# Patient Record
Sex: Male | Born: 1955 | Race: Black or African American | Hispanic: No | Marital: Married | Smoking: Former smoker
Health system: Southern US, Community
[De-identification: ages and names within clinical notes are randomized; demographics above are authoritative.]

## PROBLEM LIST (undated history)

## (undated) DIAGNOSIS — E785 Hyperlipidemia, unspecified: Secondary | ICD-10-CM

## (undated) DIAGNOSIS — I619 Nontraumatic intracerebral hemorrhage, unspecified: Secondary | ICD-10-CM

## (undated) DIAGNOSIS — I10 Essential (primary) hypertension: Secondary | ICD-10-CM

## (undated) DIAGNOSIS — K219 Gastro-esophageal reflux disease without esophagitis: Secondary | ICD-10-CM

## (undated) DIAGNOSIS — E7800 Pure hypercholesterolemia, unspecified: Secondary | ICD-10-CM

## (undated) DIAGNOSIS — E119 Type 2 diabetes mellitus without complications: Secondary | ICD-10-CM

## (undated) HISTORY — DX: Hyperlipidemia, unspecified: E78.5

## (undated) HISTORY — PX: OTHER SURGICAL HISTORY: SHX169

---

## 2008-09-24 ENCOUNTER — Emergency Department: Payer: Self-pay | Admitting: Emergency Medicine

## 2008-09-26 ENCOUNTER — Emergency Department: Payer: Self-pay | Admitting: Emergency Medicine

## 2008-09-27 ENCOUNTER — Observation Stay: Payer: Self-pay | Admitting: Specialist

## 2012-10-11 ENCOUNTER — Ambulatory Visit: Payer: Self-pay | Admitting: Internal Medicine

## 2014-04-03 ENCOUNTER — Ambulatory Visit: Payer: Self-pay | Admitting: Internal Medicine

## 2014-04-05 ENCOUNTER — Ambulatory Visit: Payer: Self-pay | Admitting: Internal Medicine

## 2015-07-19 ENCOUNTER — Observation Stay
Admission: EM | Admit: 2015-07-19 | Discharge: 2015-07-21 | Disposition: A | Discharge status: Home / Self Care | Payer: BC Managed Care – PPO | Attending: Internal Medicine | Admitting: Internal Medicine

## 2015-07-19 ENCOUNTER — Encounter: Payer: Self-pay | Admitting: Emergency Medicine

## 2015-07-19 DIAGNOSIS — I10 Essential (primary) hypertension: Secondary | ICD-10-CM | POA: Diagnosis not present

## 2015-07-19 DIAGNOSIS — E119 Type 2 diabetes mellitus without complications: Secondary | ICD-10-CM | POA: Insufficient documentation

## 2015-07-19 DIAGNOSIS — R197 Diarrhea, unspecified: Secondary | ICD-10-CM | POA: Insufficient documentation

## 2015-07-19 DIAGNOSIS — T464X5A Adverse effect of angiotensin-converting-enzyme inhibitors, initial encounter: Secondary | ICD-10-CM | POA: Insufficient documentation

## 2015-07-19 DIAGNOSIS — T783XXA Angioneurotic edema, initial encounter: Principal | ICD-10-CM | POA: Insufficient documentation

## 2015-07-19 DIAGNOSIS — Z7984 Long term (current) use of oral hypoglycemic drugs: Secondary | ICD-10-CM | POA: Diagnosis not present

## 2015-07-19 DIAGNOSIS — Z79899 Other long term (current) drug therapy: Secondary | ICD-10-CM | POA: Diagnosis not present

## 2015-07-19 DIAGNOSIS — E7800 Pure hypercholesterolemia, unspecified: Secondary | ICD-10-CM | POA: Insufficient documentation

## 2015-07-19 DIAGNOSIS — K5900 Constipation, unspecified: Secondary | ICD-10-CM | POA: Insufficient documentation

## 2015-07-19 DIAGNOSIS — Z882 Allergy status to sulfonamides status: Secondary | ICD-10-CM | POA: Diagnosis not present

## 2015-07-19 HISTORY — DX: Pure hypercholesterolemia, unspecified: E78.00

## 2015-07-19 HISTORY — DX: Essential (primary) hypertension: I10

## 2015-07-19 HISTORY — DX: Type 2 diabetes mellitus without complications: E11.9

## 2015-07-19 LAB — CBC
HCT: 41.7 % (ref 40.0–52.0)
HEMOGLOBIN: 13.9 g/dL (ref 13.0–18.0)
MCH: 28 pg (ref 26.0–34.0)
MCHC: 33.3 g/dL (ref 32.0–36.0)
MCV: 84.1 fL (ref 80.0–100.0)
PLATELETS: 301 10*3/uL (ref 150–440)
RBC: 4.96 MIL/uL (ref 4.40–5.90)
RDW: 13.9 % (ref 11.5–14.5)
WBC: 13 10*3/uL — AB (ref 3.8–10.6)

## 2015-07-19 LAB — BASIC METABOLIC PANEL
ANION GAP: 11 (ref 5–15)
BUN: 13 mg/dL (ref 6–20)
CHLORIDE: 102 mmol/L (ref 101–111)
CO2: 23 mmol/L (ref 22–32)
Calcium: 9 mg/dL (ref 8.9–10.3)
Creatinine, Ser: 0.87 mg/dL (ref 0.61–1.24)
Glucose, Bld: 125 mg/dL — ABNORMAL HIGH (ref 65–99)
POTASSIUM: 3.7 mmol/L (ref 3.5–5.1)
SODIUM: 136 mmol/L (ref 135–145)

## 2015-07-19 LAB — GLUCOSE, CAPILLARY: Glucose-Capillary: 169 mg/dL — ABNORMAL HIGH (ref 65–99)

## 2015-07-19 MED ORDER — ONDANSETRON HCL 4 MG/2ML IJ SOLN
4.0000 mg | Freq: Four times a day (QID) | INTRAMUSCULAR | Status: DC | PRN
Start: 2015-07-19 — End: 2015-07-21

## 2015-07-19 MED ORDER — LINAGLIPTIN 5 MG PO TABS
5.0000 mg | ORAL_TABLET | Freq: Every day | ORAL | Status: DC
  Administered 2015-07-20 – 2015-07-21 (×2): 5 mg via ORAL
  Filled 2015-07-19 (×2): qty 1

## 2015-07-19 MED ORDER — SODIUM CHLORIDE 0.45 % IV SOLN
INTRAVENOUS | Status: DC
  Administered 2015-07-19: 23:00:00 via INTRAVENOUS

## 2015-07-19 MED ORDER — AMLODIPINE BESYLATE 10 MG PO TABS
10.0000 mg | ORAL_TABLET | Freq: Every day | ORAL | Status: DC
  Administered 2015-07-19 – 2015-07-21 (×3): 10 mg via ORAL
  Filled 2015-07-19 (×3): qty 1

## 2015-07-19 MED ORDER — DEXAMETHASONE SODIUM PHOSPHATE 10 MG/ML IJ SOLN
10.0000 mg | Freq: Once | INTRAMUSCULAR | Status: AC
  Administered 2015-07-19: 10 mg via INTRAVENOUS
  Filled 2015-07-19: qty 1

## 2015-07-19 MED ORDER — INSULIN ASPART 100 UNIT/ML ~~LOC~~ SOLN: 0.0000 [IU] | Freq: Every day | SUBCUTANEOUS | Status: DC

## 2015-07-19 MED ORDER — ATORVASTATIN CALCIUM 20 MG PO TABS
40.0000 mg | ORAL_TABLET | Freq: Every evening | ORAL | Status: DC
  Administered 2015-07-20: 40 mg via ORAL
  Filled 2015-07-19: qty 2

## 2015-07-19 MED ORDER — METFORMIN HCL 500 MG PO TABS
1000.0000 mg | ORAL_TABLET | Freq: Two times a day (BID) | ORAL | Status: DC
  Administered 2015-07-20 – 2015-07-21 (×3): 1000 mg via ORAL
  Filled 2015-07-19 (×3): qty 2

## 2015-07-19 MED ORDER — METOPROLOL TARTRATE 5 MG/5ML IV SOLN
5.0000 mg | Freq: Four times a day (QID) | INTRAVENOUS | Status: DC | PRN
  Administered 2015-07-19 – 2015-07-20 (×2): 5 mg via INTRAVENOUS
  Filled 2015-07-19 (×3): qty 5

## 2015-07-19 MED ORDER — INSULIN ASPART 100 UNIT/ML ~~LOC~~ SOLN
0.0000 [IU] | Freq: Three times a day (TID) | SUBCUTANEOUS | Status: DC
  Administered 2015-07-20 (×3): 7 [IU] via SUBCUTANEOUS
  Administered 2015-07-21: 4 [IU] via SUBCUTANEOUS
  Filled 2015-07-19 (×3): qty 7

## 2015-07-19 MED ORDER — FAMOTIDINE IN NACL 20-0.9 MG/50ML-% IV SOLN
20.0000 mg | Freq: Two times a day (BID) | INTRAVENOUS | Status: DC
  Administered 2015-07-20: 20 mg via INTRAVENOUS
  Filled 2015-07-19 (×2): qty 50

## 2015-07-19 MED ORDER — FAMOTIDINE IN NACL 20-0.9 MG/50ML-% IV SOLN
20.0000 mg | Freq: Once | INTRAVENOUS | Status: AC
  Administered 2015-07-19: 20 mg via INTRAVENOUS
  Filled 2015-07-19: qty 50

## 2015-07-19 MED ORDER — INSULIN ASPART 100 UNIT/ML ~~LOC~~ SOLN
6.0000 [IU] | Freq: Three times a day (TID) | SUBCUTANEOUS | Status: DC
  Administered 2015-07-20 – 2015-07-21 (×4): 6 [IU] via SUBCUTANEOUS
  Filled 2015-07-19 (×4): qty 6

## 2015-07-19 MED ORDER — ACETAMINOPHEN 325 MG PO TABS: 650.0000 mg | ORAL_TABLET | Freq: Four times a day (QID) | ORAL | Status: DC | PRN

## 2015-07-19 MED ORDER — DIPHENHYDRAMINE HCL 50 MG/ML IJ SOLN
25.0000 mg | Freq: Four times a day (QID) | INTRAMUSCULAR | Status: DC | PRN
  Administered 2015-07-20: 25 mg via INTRAVENOUS
  Filled 2015-07-19: qty 1

## 2015-07-19 MED ORDER — METHYLPREDNISOLONE SODIUM SUCC 125 MG IJ SOLR
60.0000 mg | Freq: Four times a day (QID) | INTRAMUSCULAR | Status: DC
  Administered 2015-07-19 – 2015-07-20 (×2): 60 mg via INTRAVENOUS
  Filled 2015-07-19 (×2): qty 2

## 2015-07-19 MED ORDER — DIPHENHYDRAMINE HCL 50 MG/ML IJ SOLN
25.0000 mg | Freq: Once | INTRAMUSCULAR | Status: AC
  Administered 2015-07-19: 25 mg via INTRAVENOUS
  Filled 2015-07-19: qty 1

## 2015-07-19 MED ORDER — ENOXAPARIN SODIUM 40 MG/0.4ML ~~LOC~~ SOLN
40.0000 mg | SUBCUTANEOUS | Status: DC
  Administered 2015-07-19 – 2015-07-20 (×2): 40 mg via SUBCUTANEOUS
  Filled 2015-07-19 (×2): qty 0.4

## 2015-07-19 MED ORDER — ONDANSETRON HCL 4 MG PO TABS: 4.0000 mg | ORAL_TABLET | Freq: Four times a day (QID) | ORAL | Status: DC | PRN

## 2015-07-19 MED ORDER — ACETAMINOPHEN 650 MG RE SUPP: 650.0000 mg | Freq: Four times a day (QID) | RECTAL | Status: DC | PRN

## 2015-07-19 MED ORDER — METOPROLOL TARTRATE 25 MG PO TABS
25.0000 mg | ORAL_TABLET | Freq: Every day | ORAL | Status: DC
  Administered 2015-07-20: 25 mg via ORAL
  Filled 2015-07-19: qty 1

## 2015-07-19 NOTE — Progress Notes (Signed)
Pt admitted with elevated blood pressure. Notified Dr Claria Dice. Received order for IV lopressor

## 2015-07-19 NOTE — H&P (Signed)
[facility] [place] NAME: Joshua Donovan    MR#:  [id number]  DATE OF BIRTH:  1955-03-06  DATE OF ADMISSION:  07/19/2015  PRIMARY CARE PHYSICIAN: Albina Billet, MD   REQUESTING/REFERRING PHYSICIAN:   CHIEF COMPLAINT:   Chief Complaint  Patient presents with  . Angioedema    HISTORY OF PRESENT ILLNESS: Joshua Donovan  is a 61 y.o. male with a known history of diabetes mellitus, hypertension, hyperlipidemia, who presents to the hospital with complaints of swelling below her lip and chin. Constipation. He noted lower lip swelling and chin swelling is around noon, he denied any dysphagia symptoms with water after the swelling happened, however, patient bit the inside mucosa of the  lower lip the due to swelling.   PAST MEDICAL HISTORY:   Past Medical History  Diagnosis Date  . Diabetes mellitus without complication ([place])   . Hypertension   . High cholesterol     PAST SURGICAL HISTORY: History reviewed. No pertinent past surgical history.  SOCIAL HISTORY:  Social History  Substance Use Topics  . Smoking status: Unknown If Ever Smoked  . Smokeless tobacco: Not on file  . Alcohol Use: Not on file    FAMILY HISTORY: No early coronary artery disease  DRUG ALLERGIES:  Allergies  Allergen Reactions  . Sulfa Antibiotics Swelling    Review of Systems  Constitutional: Positive for weight loss. Negative for fever, chills and malaise/fatigue.  HENT: Negative for congestion.   Eyes: Negative for blurred vision and double vision.  Respiratory: Positive for cough. Negative for sputum production, shortness of breath and wheezing.   Cardiovascular: Negative for chest pain, palpitations, orthopnea, leg swelling and PND.  Gastrointestinal: Positive for diarrhea. Negative for nausea, vomiting, abdominal pain, constipation, blood in stool and melena.  Genitourinary: Negative for dysuria, urgency, frequency and hematuria.  Musculoskeletal:  Positive for myalgias. Negative for falls.  Skin: Negative for rash.  Neurological: Positive for weakness. Negative for dizziness.  Psychiatric/Behavioral: Negative for depression and memory loss. The patient is not nervous/anxious.     MEDICATIONS AT HOME:  Prior to Admission medications   Medication Sig Start Date End Date Taking? Authorizing Provider  amLODipine-benazepril (LOTREL) 5-20 MG capsule Take 1 capsule by mouth daily. 07/15/15  Yes Historical Provider, MD  atorvastatin (LIPITOR) 40 MG tablet Take 40 mg by mouth every evening. 06/22/15  Yes Historical Provider, MD  cetirizine (ZYRTEC) 10 MG tablet Take 10 mg by mouth daily.   Yes Historical Provider, MD  JANUVIA 100 MG tablet Take 100 mg by mouth daily. 06/15/15  Yes Historical Provider, MD  metFORMIN (GLUCOPHAGE) 500 MG tablet Take 1,000 mg by mouth 2 (two) times daily. 07/15/15  Yes Historical Provider, MD  metoprolol tartrate (LOPRESSOR) 25 MG tablet Take 25 mg by mouth daily. 06/22/15  Yes Historical Provider, MD  Misc Natural Products (MENS POTENT FORMULA) TABS Take 1 tablet by mouth daily.   Yes Historical Provider, MD      PHYSICAL EXAMINATION:   VITAL SIGNS: Blood pressure 181/107, pulse 85, temperature 98.9 F (37.2 C), temperature source Oral, resp. rate 16, height 5\' 7"  (1.702 m), weight 98.431 kg (217 lb), SpO2 98 %.  GENERAL:  60 y.o.-year-old patient lying in the bed with no acute distress.Significantly swollen lower lip and chin visible from the distance  EYES: Pupils equal, round, reactive to light and accommodation. No scleral icterus. Extraocular muscles intact.  HEENT: Head atraumatic, normocephalic. Oropharynx and nasopharynx clear. Swelling of the  lower lip and chin, but not tongue. NECK:  Supple, no jugular venous distention. No thyroid enlargement, no tenderness.  LUNGS: Normal breath sounds bilaterally, no wheezing, rales,rhonchi or crepitation. No use of accessory muscles of respiration.  CARDIOVASCULAR: S1,  S2 normal. No murmurs, rubs, or gallops.  ABDOMEN: Soft, nontender, nondistended. Bowel sounds present. No organomegaly or mass.  EXTREMITIES: No pedal edema, cyanosis, or clubbing.  NEUROLOGIC: Cranial nerves II through XII are intact. Muscle strength 5/5 in all extremities. Sensation intact. Gait not checked.  PSYCHIATRIC: The patient is alert and oriented x 3.  SKIN: No obvious rash, lesion, or ulcer.   LABORATORY PANEL:   CBC  Recent Labs Lab 07/19/15 1929  WBC 13.0*  HGB 13.9  HCT 41.7  PLT 301  MCV 84.1  MCH 28.0  MCHC 33.3  RDW 13.9   ------------------------------------------------------------------------------------------------------------------  Chemistries  No results for input(s): NA, K, CL, CO2, GLUCOSE, BUN, CREATININE, CALCIUM, MG, AST, ALT, ALKPHOS, BILITOT in the last 168 hours.  Invalid input(s): GFRCGP ------------------------------------------------------------------------------------------------------------------  Cardiac Enzymes No results for input(s): TROPONINI in the last 168 hours. ------------------------------------------------------------------------------------------------------------------  RADIOLOGY: No results found.  EKG: No orders found for this or any previous visit.  IMPRESSION AND PLAN:  Principal Problem:   Angioedema Active Problems:   Malignant essential hypertension   Diarrhea #1. Angioedema due to ACE inhibitor, admit patient to a medical floor, discontinue benazepril, continue him on Pepcid, steroids, Benadryl, following clinically, possible discharge home on current medications, tapering steroid doses tomorrow #2. Malignant essential hypertension, advance Norvasc 10 mg daily dose, continue metoprolol, discontinue benazepril, discussed this patient extensively #3. Diarrhea, get C. difficile testing #4 diabetes mellitus, continue patient on sliding scale insulin while in the hospital on steroids , awaiting for labs   All  the records are reviewed and case discussed with ED provider. Management plans discussed with the patient, family and they are in agreement.  CODE STATUS: Code Status History    This patient does not have a recorded code status. Please follow your organizational policy for patients in this situation.       TOTAL TIME TAKING CARE OF THIS PATIENT: 50 minutes.    Theodoro Grist M.D on 07/19/2015 at 7:46 PM  Between 7am to 6pm - Pager - [phone] After 6pm go to [email or web address] - password [email or web address] [place] [facility]  Office  [phone]  CC: Primary care physician; Albina Billet, MD

## 2015-07-19 NOTE — ED Notes (Signed)
Patient noted lip swelling this afternoon around 1200. No airway involvement. Patient denies taking lisinopril, new foods, or new medications

## 2015-07-19 NOTE — ED Provider Notes (Signed)
[facility] Emergency Department Provider Note  ____________________________________________  Time seen: Approximately 7:09 PM  I have reviewed the triage vital signs and the nursing notes.   HISTORY  Chief Complaint Angioedema    HPI Joshua Donovan is a 60 y.o. male who has been stable on amlodipine-benazeprilfor years presenting with lower lip and lower facial swelling acutely since noon today. The patient reports that he has not had any trauma, new foods or medications.  He denies any shortness of breath, swelling of the tongue, scratchy throat, or voice hoarseness. No difficulty swallowing his saliva. No family history of angioedema.    Past Medical History  Diagnosis Date  . Diabetes mellitus without complication ([place])   . Hypertension   . High cholesterol     There are no active problems to display for this patient.   History reviewed. No pertinent past surgical history.  Current Outpatient Rx  Name  Route  Sig  Dispense  Refill  . amLODipine-benazepril (LOTREL) 5-20 MG capsule   Oral   Take 1 capsule by mouth daily.      5   . atorvastatin (LIPITOR) 40 MG tablet   Oral   Take 40 mg by mouth every evening.      5   . JANUVIA 100 MG tablet   Oral   Take 100 mg by mouth daily.      5     Dispense as written.   . metFORMIN (GLUCOPHAGE) 500 MG tablet   Oral   Take 1,000 mg by mouth 2 (two) times daily.      5   . metoprolol tartrate (LOPRESSOR) 25 MG tablet   Oral   Take 25 mg by mouth daily.      3     Allergies Review of patient's allergies indicates not on file.  History reviewed. No pertinent family history.  Social History Social History  Substance Use Topics  . Smoking status: Unknown If Ever Smoked  . Smokeless tobacco: None  . Alcohol Use: None    Review of Systems Constitutional: No fever/chills. Eyes: No visual changes. ENT: No sore throat. No congestion or rhinorrhea.Positive lower lip and lower  facial swelling. No stiff neck. No drooling. No stridor or trismus. Cardiovascular: Denies chest pain. Denies palpitations. Respiratory: Denies shortness of breath.  No cough. Gastrointestinal: No abdominal pain.  No nausea, no vomiting.  No diarrhea.  No constipation. Musculoskeletal: Negative for back pain. Skin: Negative for rash. Neurological: Negative for headaches. No focal numbness, tingling or weakness.   10-point ROS otherwise negative.  ____________________________________________   PHYSICAL EXAM:  VITAL SIGNS: ED Triage Vitals  Enc Vitals Group     BP 07/19/15 1856 181/107 mmHg     Pulse Rate 07/19/15 1856 85     Resp 07/19/15 1856 16     Temp 07/19/15 1856 98.9 F (37.2 C)     Temp Source 07/19/15 1856 Oral     SpO2 07/19/15 1856 98 %     Weight 07/19/15 1856 217 lb (98.431 kg)     Height 07/19/15 1856 5\' 7"  (1.702 m)     Head Cir --      Peak Flow --      Pain Score --      Pain Loc --      Pain Edu? --      Excl. in [place]? --     Constitutional: Alert and oriented. Well appearing and in no acute distress. Answers questions appropriately. Eyes:  Conjunctivae are normal.  EOMI. No scleral icterus. Head: Atraumatic. Nose: No congestion/rhinnorhea. Mouth/Throat: Mucous membranes are moist. Patient has significant swelling of the lower lip, and the chin area. He does not have fullness in the submandibular area. The patient has no trismus, or stridor. Posterior pharynx is without swelling, posterior palate is symmetric, and the uvula is midline and normal sized. There is no drooling or difficulty maintaining secretions. No associated erythema. Neck: No stridor.  Supple.  No meningismus. Cardiovascular: Normal rate, regular rhythm. No murmurs, rubs or gallops.  Respiratory: Normal respiratory effort.  No accessory muscle use or retractions. Lungs CTAB.  No wheezes, rales or ronchi. Musculoskeletal: No LE edema.  Neurologic:  A&Ox3.  Speech is clear.  Face and smile are  symmetric.  EOMI.  Moves all extremities well. Skin:  Skin is warm, dry and intact. No rash noted. Psychiatric: Mood and affect are normal. Speech and behavior are normal.  Normal judgement.  ____________________________________________   LABS (all labs ordered are listed, but only abnormal results are displayed)  Labs Reviewed  CBC  BASIC METABOLIC PANEL   ____________________________________________  EKG  Not indicated ____________________________________________  RADIOLOGY  No results found.  ____________________________________________   PROCEDURES  Procedure(s) performed: None  Critical Care performed: No ____________________________________________   INITIAL IMPRESSION / ASSESSMENT AND PLAN / ED COURSE  Pertinent labs & imaging results that were available during my care of the patient were reviewed by me and considered in my medical decision making (see chart for details).  60 y.o. male who has been stable on but hospital for several years presenting with lower lip and chin swelling. At this time, the patient has no evidence of airway compromise. The most that the cause of his angioedema is his ACE inhibitor use. However, we will treat him with steroids, antihistamines and a PPI if there is any allergic component. The patient will require admission to the hospital.  ____________________________________________  FINAL CLINICAL IMPRESSION(S) / ED DIAGNOSES  Final diagnoses:  Angioedema, initial encounter      NEW MEDICATIONS STARTED DURING THIS VISIT:  New Prescriptions   No medications on file     Eula Listen, MD 07/19/15 1913

## 2015-07-19 NOTE — ED Notes (Signed)
Called to give report, nurse will call back in few minutes.

## 2015-07-20 LAB — GLUCOSE, CAPILLARY
GLUCOSE-CAPILLARY: 214 mg/dL — AB (ref 65–99)
GLUCOSE-CAPILLARY: 240 mg/dL — AB (ref 65–99)
GLUCOSE-CAPILLARY: 192 mg/dL — AB (ref 65–99)
Glucose-Capillary: 237 mg/dL — ABNORMAL HIGH (ref 65–99)

## 2015-07-20 LAB — CBC
HCT: 41.1 % (ref 40.0–52.0)
Hemoglobin: 13.8 g/dL (ref 13.0–18.0)
MCH: 28.2 pg (ref 26.0–34.0)
MCHC: 33.7 g/dL (ref 32.0–36.0)
MCV: 83.9 fL (ref 80.0–100.0)
PLATELETS: 305 10*3/uL (ref 150–440)
RBC: 4.89 MIL/uL (ref 4.40–5.90)
RDW: 14 % (ref 11.5–14.5)
WBC: 8.6 10*3/uL (ref 3.8–10.6)

## 2015-07-20 LAB — BASIC METABOLIC PANEL
Anion gap: 9 (ref 5–15)
BUN: 13 mg/dL (ref 6–20)
CHLORIDE: 98 mmol/L — AB (ref 101–111)
CO2: 27 mmol/L (ref 22–32)
CREATININE: 0.96 mg/dL (ref 0.61–1.24)
Calcium: 8.5 mg/dL — ABNORMAL LOW (ref 8.9–10.3)
GFR calc Af Amer: >60 mL/min (ref >60)
GFR calc non Af Amer: >60 mL/min (ref >60)
Glucose, Bld: 255 mg/dL — ABNORMAL HIGH (ref 65–99)
Potassium: 3.7 mmol/L (ref 3.5–5.1)
Sodium: 134 mmol/L — ABNORMAL LOW (ref 135–145)

## 2015-07-20 LAB — HEMOGLOBIN A1C: Hgb A1c MFr Bld: 7 % — ABNORMAL HIGH (ref 4.0–6.0)

## 2015-07-20 LAB — C DIFFICILE QUICK SCREEN W PCR REFLEX
C DIFFICLE (CDIFF) ANTIGEN: NEGATIVE
C Diff interpretation: NOT DETECTED
C Diff toxin: NEGATIVE

## 2015-07-20 MED ORDER — FAMOTIDINE 20 MG PO TABS
20.0000 mg | ORAL_TABLET | Freq: Two times a day (BID) | ORAL | Status: DC
  Administered 2015-07-20 – 2015-07-21 (×2): 20 mg via ORAL
  Filled 2015-07-20 (×2): qty 1

## 2015-07-20 MED ORDER — METOPROLOL TARTRATE 25 MG PO TABS
25.0000 mg | ORAL_TABLET | Freq: Two times a day (BID) | ORAL | Status: DC
  Administered 2015-07-20 – 2015-07-21 (×2): 25 mg via ORAL
  Filled 2015-07-20 (×2): qty 1

## 2015-07-20 MED ORDER — METHYLPREDNISOLONE SODIUM SUCC 125 MG IJ SOLR: 60.0000 mg | Freq: Four times a day (QID) | INTRAMUSCULAR | Status: DC

## 2015-07-20 MED ORDER — POTASSIUM CHLORIDE CRYS ER 20 MEQ PO TBCR: 40.0000 meq | EXTENDED_RELEASE_TABLET | Freq: Once | ORAL | Status: DC

## 2015-07-20 MED ORDER — METHYLPREDNISOLONE SODIUM SUCC 125 MG IJ SOLR
60.0000 mg | Freq: Two times a day (BID) | INTRAMUSCULAR | Status: DC
  Administered 2015-07-20 – 2015-07-21 (×3): 60 mg via INTRAVENOUS
  Filled 2015-07-20 (×3): qty 2

## 2015-07-20 NOTE — Progress Notes (Signed)
   07/20/15 1400  Clinical Encounter Type  Visited With Patient  Visit Type Initial  Consult/Referral To Chaplain  Rounded in unit and offered pastoral care.   Chaplain Teancum Brule Ext:[phone] 

## 2015-07-20 NOTE — Progress Notes (Signed)
[facility] at [place] NAME: Joshua Donovan    MR#:  [id number]  DATE OF BIRTH:  July 07, 1955  SUBJECTIVE:  CHIEF COMPLAINT:   Chief Complaint  Patient presents with  . Angioedema  Now upper lip swollen, no trouble breathing  REVIEW OF SYSTEMS:  Review of Systems  Constitutional: Negative for fever, weight loss, malaise/fatigue and diaphoresis.  HENT: Negative for ear discharge, ear pain, hearing loss, nosebleeds, sore throat and tinnitus.        Upper lip swelling  Eyes: Negative for blurred vision and pain.  Respiratory: Negative for cough, hemoptysis, shortness of breath and wheezing.   Cardiovascular: Negative for chest pain, palpitations, orthopnea and leg swelling.  Gastrointestinal: Negative for heartburn, nausea, vomiting, abdominal pain, diarrhea, constipation and blood in stool.  Genitourinary: Negative for dysuria, urgency and frequency.  Musculoskeletal: Negative for myalgias and back pain.  Skin: Negative for itching and rash.  Neurological: Negative for dizziness, tingling, tremors, focal weakness, seizures, weakness and headaches.  Psychiatric/Behavioral: Negative for depression. The patient is not nervous/anxious.    DRUG ALLERGIES:   Allergies  Allergen Reactions  . Ace Inhibitors Swelling  . Sulfa Antibiotics Swelling   VITALS:  Blood pressure 164/96, pulse 74, temperature 96.4 F (35.8 C), temperature source Oral, resp. rate 18, height 5\' 7"  (1.702 m), weight 84.097 kg (185 lb 6.4 oz), SpO2 96 %. PHYSICAL EXAMINATION:  Physical Exam  Constitutional: He is oriented to person, place, and time and well-developed, well-nourished, and in no distress.  HENT:  Head: Normocephalic and atraumatic.  Mouth/Throat:    Swollen Upper lip  Eyes: Conjunctivae and EOM are normal. Pupils are equal, round, and reactive to light.  Neck: Normal range of motion. Neck supple. No tracheal deviation present. No thyromegaly present.    Cardiovascular: Normal rate, regular rhythm and normal heart sounds.   Pulmonary/Chest: Effort normal and breath sounds normal. No respiratory distress. He has no wheezes. He exhibits no tenderness.  Abdominal: Soft. Bowel sounds are normal. He exhibits no distension. There is no tenderness.  Musculoskeletal: Normal range of motion.  Neurological: He is alert and oriented to person, place, and time. No cranial nerve deficit.  Skin: Skin is warm and dry. No rash noted.  Psychiatric: Mood and affect normal.   LABORATORY PANEL:   CBC  Recent Labs Lab 07/20/15 0335  WBC 8.6  HGB 13.8  HCT 41.1  PLT 305   ------------------------------------------------------------------------------------------------------------------ Chemistries   Recent Labs Lab 07/20/15 0335  NA 134*  K 3.7  CL 98*  CO2 27  GLUCOSE 255*  BUN 13  CREATININE 0.96  CALCIUM 8.5*   RADIOLOGY:  No results found. ASSESSMENT AND PLAN:  #1. Angioedema due to ACE inhibitor - Discontinued benazepril, Listed this med as allergy - continue him on Pepcid, steroids, Benadryl, following clinically, possible discharge home tomorrow  #2. Malignant essential hypertension - Continue Norvasc 10 mg daily dose,  - Increase dose of metoprolol  #3. Diarrhea, get C. difficile testing  #4 diabetes mellitus - continue patient on sliding scale insulin  - taper steroids      All the records are reviewed and case discussed with Care Management/Social Worker. Management plans discussed with the patient, family and they are in agreement.  CODE STATUS: FULL CODE  TOTAL TIME TAKING CARE OF THIS PATIENT: 35 minutes.   More than 50% of the time was spent in counseling/coordination of care: YES  POSSIBLE D/C IN AM, DEPENDING ON CLINICAL CONDITION.  [facility], Valene Villa M.D on 07/20/2015 at 10:03 AM  Between 7am to 6pm - Pager - [phone]  After 6pm go to [email or web address] - Proofreader  [facility] [place]  [facility]  Office  [phone]  CC: Primary care physician; Albina Billet, MD  Note: This dictation was prepared with Dragon dictation along with smaller phrase technology. Any transcriptional errors that result from this process are unintentional.

## 2015-07-20 NOTE — Progress Notes (Addendum)
Pt was admitted for lower lip/chin swelling which has resolved, however this AM noted on assessment that top lip is now swollen. Pt denies pain/itching, tongue normal, no additional swelling noted.

## 2015-07-21 LAB — GLUCOSE, CAPILLARY: GLUCOSE-CAPILLARY: 172 mg/dL — AB (ref 65–99)

## 2015-07-21 MED ORDER — CLONIDINE HCL 0.1 MG PO TABS: 0.1000 mg | ORAL_TABLET | Freq: Two times a day (BID) | ORAL | Status: DC

## 2015-07-21 MED ORDER — AMLODIPINE BESYLATE 10 MG PO TABS: 10.0000 mg | ORAL_TABLET | Freq: Every day | ORAL | Status: DC

## 2015-07-21 NOTE — Progress Notes (Signed)
Patient discharged to home with family. Meds reviewed with pt. Has f/u appt this afternoon with Dr. Hall Busing (PCP) in [place], [place].

## 2015-07-21 NOTE — Discharge Summary (Signed)
[facility] at [place] NAME: Joshua Donovan    MR#:  [id number]  DATE OF BIRTH:  [birth year]  DATE OF ADMISSION:  07/19/2015 ADMITTING PHYSICIAN: Theodoro Grist, MD  DATE OF DISCHARGE: 07/21/2015  PRIMARY CARE PHYSICIAN: Albina Billet, MD    ADMISSION DIAGNOSIS:  Angioedema, initial encounter [T78.3XXA]  DISCHARGE DIAGNOSIS:  Principal Problem:   Angioedema Active Problems:   Malignant essential hypertension   Diarrhea  SECONDARY DIAGNOSIS:   Past Medical History  Diagnosis Date  . Diabetes mellitus without complication ([place])   . Hypertension   . High cholesterol    HOSPITAL COURSE:  #1. Angioedema due to ACE inhibitor - Discontinued benazepril, Listed this med as allergy  #2. Malignant essential hypertension - improving on norvasc. Will add clonidine on D/C  #3. Diarrhea: likely viral etio. C. difficile neg  DISCHARGE CONDITIONS:   stable  CONSULTS OBTAINED:     DRUG ALLERGIES:   Allergies  Allergen Reactions  . Lisinopril Swelling  . Ace Inhibitors Swelling  . Sulfa Antibiotics Swelling    DISCHARGE MEDICATIONS:   Current Discharge Medication List    START taking these medications   Details  amLODipine (NORVASC) 10 MG tablet Take 1 tablet (10 mg total) by mouth daily. Qty: 30 tablet, Refills: 0    cloNIDine (CATAPRES) 0.1 MG tablet Take 1 tablet (0.1 mg total) by mouth 2 (two) times daily. Qty: 60 tablet, Refills: 11      CONTINUE these medications which have NOT CHANGED   Details  atorvastatin (LIPITOR) 40 MG tablet Take 40 mg by mouth every evening. Refills: 5    cetirizine (ZYRTEC) 10 MG tablet Take 10 mg by mouth daily.    JANUVIA 100 MG tablet Take 100 mg by mouth daily. Refills: 5    metFORMIN (GLUCOPHAGE) 500 MG tablet Take 1,000 mg by mouth 2 (two) times daily. Refills: 5    metoprolol tartrate (LOPRESSOR) 25 MG tablet Take 25 mg by mouth daily. Refills: 3    Misc Natural  Products (MENS POTENT FORMULA) TABS Take 1 tablet by mouth daily.      STOP taking these medications     amLODipine-benazepril (LOTREL) 5-20 MG capsule        DISCHARGE INSTRUCTIONS:    DIET:  Regular diet  DISCHARGE CONDITION:  Good  ACTIVITY:  Activity as tolerated  OXYGEN:  Home Oxygen: No.   Oxygen Delivery: room air  DISCHARGE LOCATION:  home   If you experience worsening of your admission symptoms, develop shortness of breath, life threatening emergency, suicidal or homicidal thoughts you must seek medical attention immediately by calling 911 or calling your MD immediately  if symptoms less severe.  You Must read complete instructions/literature along with all the possible adverse reactions/side effects for all the Medicines you take and that have been prescribed to you. Take any new Medicines after you have completely understood and accpet all the possible adverse reactions/side effects.   Please note  You were cared for by a hospitalist during your hospital stay. If you have any questions about your discharge medications or the care you received while you were in the hospital after you are discharged, you can call the unit and asked to speak with the hospitalist on call if the hospitalist that took care of you is not available. Once you are discharged, your primary care physician will handle any further medical issues. Please note that NO REFILLS for any discharge medications will be authorized  once you are discharged, as it is imperative that you return to your primary care physician (or establish a relationship with a primary care physician if you do not have one) for your aftercare needs so that they can reassess your need for medications and monitor your lab values.    On the day of Discharge:  VITAL SIGNS:  Blood pressure 174/101, pulse 74, temperature 98.1 F (36.7 C), temperature source Oral, resp. rate 17, height 5\' 7"  (1.702 m), weight 84.097 kg (185 lb 6.4  oz), SpO2 98 %. PHYSICAL EXAMINATION:  GENERAL:  60 y.o.-year-old patient lying in the bed with no acute distress.  EYES: Pupils equal, round, reactive to light and accommodation. No scleral icterus. Extraocular muscles intact.  HEENT: Head atraumatic, normocephalic. Oropharynx and nasopharynx clear.  NECK:  Supple, no jugular venous distention. No thyroid enlargement, no tenderness.  LUNGS: Normal breath sounds bilaterally, no wheezing, rales,rhonchi or crepitation. No use of accessory muscles of respiration.  CARDIOVASCULAR: S1, S2 normal. No murmurs, rubs, or gallops.  ABDOMEN: Soft, non-tender, non-distended. Bowel sounds present. No organomegaly or mass.  EXTREMITIES: No pedal edema, cyanosis, or clubbing.  NEUROLOGIC: Cranial nerves II through XII are intact. Muscle strength 5/5 in all extremities. Sensation intact. Gait not checked.  PSYCHIATRIC: The patient is alert and oriented x 3.  SKIN: No obvious rash, lesion, or ulcer.  DATA REVIEW:   CBC  Recent Labs Lab 07/20/15 0335  WBC 8.6  HGB 13.8  HCT 41.1  PLT 305    Chemistries   Recent Labs Lab 07/20/15 0335  NA 134*  K 3.7  CL 98*  CO2 27  GLUCOSE 255*  BUN 13  CREATININE 0.96  CALCIUM 8.5*     Management plans discussed with the patient, family and they are in agreement.  CODE STATUS: FULL CODE  TOTAL TIME TAKING CARE OF THIS PATIENT: 45 minutes.    [facility], Reema Chick M.D on 07/21/2015 at 11:24 AM  Between 7am to 6pm - Pager - [phone]  After 6pm go to [email or web address] - password [email or web address] [place] [facility]  Office  [phone]  CC: Primary care physician; Albina Billet, MD   Note: This dictation was prepared with Dragon dictation along with smaller phrase technology. Any transcriptional errors that result from this process are unintentional.

## 2015-07-21 NOTE — Care Management Note (Signed)
Case Management Note  Patient Details  Name: Joshua Donovan MRN: [id number] Date of Birth: [birth year]  Subjective/Objective:    Spoke withpatient for discharge planning. Alert oreinted from home and independent. No CM needs identified.                Action/Plan: Home with Self care.  Will sign off.   Expected Discharge Date:                  Expected Discharge Plan:  Home/Self Care  In-House Referral:     Discharge planning Services     Post Acute Care Choice:    Choice offered to:  Patient  DME Arranged:    DME Agency:     HH Arranged:    [place] Agency:     Status of Service:  Completed, signed off  If discussed at [employer] of Stay Meetings, dates discussed:    Additional Comments:  Alvie Heidelberg, RN 07/21/2015, 10:00 AM

## 2015-07-21 NOTE — Discharge Instructions (Signed)
Angioedema  Angioedema is sudden puffiness (swelling), often of the skin. It can happen:  · On your face or privates (genitals).  · In your belly (abdomen) or other body parts.  It usually happens quickly and gets better in 1 or 2 days. It often starts at night and is found when you wake up. You may get red, itchy patches of skin (hives). Attacks can be dangerous if your breathing passages get puffy.  The condition may happen only once, or it can come back at random times. It may happen for several years before it goes away for good.  HOME CARE  · Only take medicines as told by your doctor.  · Always carry your emergency allergy medicines with you.  · Wear a medical bracelet as told by your doctor.  · Avoid things that you know will cause attacks (triggers).  GET HELP IF:  · You have another attack.  · Your attacks happen more often or get worse.  · The condition was passed to you by your parents and you want to have children.  GET HELP RIGHT AWAY IF:   · Your mouth, tongue, or lips are very puffy.  · You have trouble breathing.  · You have trouble swallowing.  · You pass out (faint).  MAKE SURE YOU:   · Understand these instructions.  · Will watch your condition.  · Will get help right away if you are not doing well or get worse.     This information is not intended to replace advice given to you by your health care provider. Make sure you discuss any questions you have with your health care provider.     Document Released: 12/15/2008 Document Revised: 10/17/2012 Document Reviewed: 08/20/2012  Elsevier Interactive Patient Education ©2016 Elsevier Inc.

## 2015-08-11 DIAGNOSIS — I619 Nontraumatic intracerebral hemorrhage, unspecified: Secondary | ICD-10-CM

## 2015-08-11 HISTORY — DX: Nontraumatic intracerebral hemorrhage, unspecified: I61.9

## 2015-09-04 ENCOUNTER — Other Ambulatory Visit: Payer: Self-pay | Admitting: Neurology

## 2015-09-04 DIAGNOSIS — I611 Nontraumatic intracerebral hemorrhage in hemisphere, cortical: Secondary | ICD-10-CM

## 2015-09-09 DIAGNOSIS — S0219XA Other fracture of base of skull, initial encounter for closed fracture: Secondary | ICD-10-CM | POA: Insufficient documentation

## 2015-09-09 DIAGNOSIS — I611 Nontraumatic intracerebral hemorrhage in hemisphere, cortical: Secondary | ICD-10-CM | POA: Insufficient documentation

## 2015-09-17 ENCOUNTER — Ambulatory Visit
Admission: RE | Admit: 2015-09-17 | Discharge: 2015-09-17 | Disposition: A | Discharge status: Home / Self Care | Payer: BC Managed Care – PPO | Source: Ambulatory Visit | Attending: Neurology | Admitting: Neurology

## 2015-09-17 DIAGNOSIS — I611 Nontraumatic intracerebral hemorrhage in hemisphere, cortical: Secondary | ICD-10-CM | POA: Diagnosis present

## 2015-09-17 DIAGNOSIS — S0219XS Other fracture of base of skull, sequela: Secondary | ICD-10-CM | POA: Diagnosis present

## 2015-09-17 DIAGNOSIS — R609 Edema, unspecified: Secondary | ICD-10-CM | POA: Diagnosis not present

## 2017-03-02 ENCOUNTER — Other Ambulatory Visit: Payer: Self-pay | Admitting: Internal Medicine

## 2017-05-19 ENCOUNTER — Other Ambulatory Visit: Payer: Self-pay

## 2017-05-19 MED ORDER — METFORMIN HCL 500 MG PO TABS: 1000.0000 mg | ORAL_TABLET | Freq: Two times a day (BID) | ORAL | 1 refills | Status: DC

## 2017-06-16 ENCOUNTER — Ambulatory Visit: Payer: Self-pay | Admitting: Nurse Practitioner

## 2017-07-17 ENCOUNTER — Other Ambulatory Visit: Payer: Self-pay

## 2017-07-17 MED ORDER — METFORMIN HCL 500 MG PO TABS: 1000.0000 mg | ORAL_TABLET | Freq: Two times a day (BID) | ORAL | 0 refills | Status: DC

## 2017-07-17 NOTE — Telephone Encounter (Signed)
lmom pt need appt for further refills  

## 2017-08-01 ENCOUNTER — Telehealth: Payer: Self-pay

## 2017-08-01 NOTE — Telephone Encounter (Signed)
DENIED RX FOR METOPROLOL TRY TO CALL HIM NO ANSWER PT NEED FOR FURTHER REFILLS

## 2017-08-02 ENCOUNTER — Other Ambulatory Visit: Payer: Self-pay | Admitting: Internal Medicine

## 2017-08-03 ENCOUNTER — Other Ambulatory Visit: Payer: Self-pay | Admitting: Internal Medicine

## 2017-08-03 ENCOUNTER — Other Ambulatory Visit: Payer: Self-pay

## 2017-08-03 ENCOUNTER — Encounter: Payer: Self-pay | Admitting: Adult Health

## 2017-08-03 ENCOUNTER — Ambulatory Visit: Payer: BC Managed Care – PPO | Admitting: Adult Health

## 2017-08-03 VITALS — BP 168/88 | HR 88 | Resp 16 | Ht 67.0 in | Wt 199.6 lb

## 2017-08-03 DIAGNOSIS — Z1211 Encounter for screening for malignant neoplasm of colon: Secondary | ICD-10-CM | POA: Diagnosis not present

## 2017-08-03 DIAGNOSIS — I10 Essential (primary) hypertension: Secondary | ICD-10-CM | POA: Diagnosis not present

## 2017-08-03 DIAGNOSIS — E119 Type 2 diabetes mellitus without complications: Secondary | ICD-10-CM | POA: Diagnosis not present

## 2017-08-03 LAB — POCT GLYCOSYLATED HEMOGLOBIN (HGB A1C): Hemoglobin A1C: 6.4 % — AB (ref 4.0–5.6)

## 2017-08-03 MED ORDER — ACCU-CHEK FASTCLIX LANCETS MISC: 1 refills | Status: DC

## 2017-08-03 MED ORDER — PNEUMOCOCCAL 13-VAL CONJ VACC IM SUSP: 0.5000 mL | INTRAMUSCULAR | 0 refills | Status: AC

## 2017-08-03 MED ORDER — GLUCOSE BLOOD VI STRP: 1.0000 | ORAL_STRIP | Freq: Two times a day (BID) | 1 refills | Status: DC

## 2017-08-03 MED ORDER — ACCU-CHEK AVIVA PLUS W/DEVICE KIT: PACK | 0 refills | Status: DC

## 2017-08-03 NOTE — Patient Instructions (Signed)
Diabetes Mellitus and Nutrition When you have diabetes (diabetes mellitus), it is very important to have healthy eating habits because your blood sugar (glucose) levels are greatly affected by what you eat and drink. Eating healthy foods in the appropriate amounts, at about the same times every day, can help you:  Control your blood glucose.  Lower your risk of heart disease.  Improve your blood pressure.  Reach or maintain a healthy weight.  Every person with diabetes is different, and each person has different needs for a meal plan. Your health care provider may recommend that you work with a diet and nutrition specialist (dietitian) to make a meal plan that is best for you. Your meal plan may vary depending on factors such as:  The calories you need.  The medicines you take.  Your weight.  Your blood glucose, blood pressure, and cholesterol levels.  Your activity level.  Other health conditions you have, such as heart or kidney disease.  How do carbohydrates affect me? Carbohydrates affect your blood glucose level more than any other type of food. Eating carbohydrates naturally increases the amount of glucose in your blood. Carbohydrate counting is a method for keeping track of how many carbohydrates you eat. Counting carbohydrates is important to keep your blood glucose at a healthy level, especially if you use insulin or take certain oral diabetes medicines. It is important to know how many carbohydrates you can safely have in each meal. This is different for every person. Your dietitian can help you calculate how many carbohydrates you should have at each meal and for snack. Foods that contain carbohydrates include:  Bread, cereal, rice, pasta, and crackers.  Potatoes and corn.  Peas, beans, and lentils.  Milk and yogurt.  Fruit and juice.  Desserts, such as cakes, cookies, ice cream, and candy.  How does alcohol affect me? Alcohol can cause a sudden decrease in blood  glucose (hypoglycemia), especially if you use insulin or take certain oral diabetes medicines. Hypoglycemia can be a life-threatening condition. Symptoms of hypoglycemia (sleepiness, dizziness, and confusion) are similar to symptoms of having too much alcohol. If your health care provider says that alcohol is safe for you, follow these guidelines:  Limit alcohol intake to no more than 1 drink per day for nonpregnant women and 2 drinks per day for men. One drink equals 12 oz of beer, 5 oz of wine, or 1 oz of hard liquor.  Do not drink on an empty stomach.  Keep yourself hydrated with water, diet soda, or unsweetened iced tea.  Keep in mind that regular soda, juice, and other mixers may contain a lot of sugar and must be counted as carbohydrates.  What are tips for following this plan? Reading food labels  Start by checking the serving size on the label. The amount of calories, carbohydrates, fats, and other nutrients listed on the label are based on one serving of the food. Many foods contain more than one serving per package.  Check the total grams (g) of carbohydrates in one serving. You can calculate the number of servings of carbohydrates in one serving by dividing the total carbohydrates by 15. For example, if a food has 30 g of total carbohydrates, it would be equal to 2 servings of carbohydrates.  Check the number of grams (g) of saturated and trans fats in one serving. Choose foods that have low or no amount of these fats.  Check the number of milligrams (mg) of sodium in one serving. Most people   should limit total sodium intake to less than 2,300 mg per day.  Always check the nutrition information of foods labeled as "low-fat" or "nonfat". These foods may be higher in added sugar or refined carbohydrates and should be avoided.  Talk to your dietitian to identify your daily goals for nutrients listed on the label. Shopping  Avoid buying canned, premade, or processed foods. These  foods tend to be high in fat, sodium, and added sugar.  Shop around the outside edge of the grocery store. This includes fresh fruits and vegetables, bulk grains, fresh meats, and fresh dairy. Cooking  Use low-heat cooking methods, such as baking, instead of high-heat cooking methods like deep frying.  Cook using healthy oils, such as olive, canola, or sunflower oil.  Avoid cooking with butter, cream, or high-fat meats. Meal planning  Eat meals and snacks regularly, preferably at the same times every day. Avoid going long periods of time without eating.  Eat foods high in fiber, such as fresh fruits, vegetables, beans, and whole grains. Talk to your dietitian about how many servings of carbohydrates you can eat at each meal.  Eat 4-6 ounces of lean protein each day, such as lean meat, chicken, fish, eggs, or tofu. 1 ounce is equal to 1 ounce of meat, chicken, or fish, 1 egg, or 1/4 cup of tofu.  Eat some foods each day that contain healthy fats, such as avocado, nuts, seeds, and fish. Lifestyle   Check your blood glucose regularly.  Exercise at least 30 minutes 5 or more days each week, or as told by your health care provider.  Take medicines as told by your health care provider.  Do not use any products that contain nicotine or tobacco, such as cigarettes and e-cigarettes. If you need help quitting, ask your health care provider.  Work with a counselor or diabetes educator to identify strategies to manage stress and any emotional and social challenges. What are some questions to ask my health care provider?  Do I need to meet with a diabetes educator?  Do I need to meet with a dietitian?  What number can I call if I have questions?  When are the best times to check my blood glucose? Where to find more information:  American Diabetes Association: diabetes.org/food-and-fitness/food  Academy of Nutrition and Dietetics:  www.eatright.org/resources/health/diseases-and-conditions/diabetes  National Institute of Diabetes and Digestive and Kidney Diseases (NIH): www.niddk.nih.gov/health-information/diabetes/overview/diet-eating-physical-activity Summary  A healthy meal plan will help you control your blood glucose and maintain a healthy lifestyle.  Working with a diet and nutrition specialist (dietitian) can help you make a meal plan that is best for you.  Keep in mind that carbohydrates and alcohol have immediate effects on your blood glucose levels. It is important to count carbohydrates and to use alcohol carefully. This information is not intended to replace advice given to you by your health care provider. Make sure you discuss any questions you have with your health care provider. Document Released: 09/23/2004 Document Revised: 02/01/2016 Document Reviewed: 02/01/2016 Elsevier Interactive Patient Education  2018 Elsevier Inc.  

## 2017-08-03 NOTE — Telephone Encounter (Signed)
Spoke with phar and called in accu-chek aviva plus machine and test strips because accu-chek guide is not covered by his insurance

## 2017-08-03 NOTE — Progress Notes (Signed)
prevnar shot ordered and sent to pharmacy

## 2017-08-03 NOTE — Progress Notes (Signed)
[facility] [place], [place] [address]  Internal MEDICINE  Office Visit Note  Patient Name: Joshua Donovan  [id number]  [id number]  Date of Service: 08/03/2017  Chief Complaint  Patient presents with  . Diabetes  . Hypertension  . Quality Metric Gaps    colonoscopy, , foot exam, eye exam     Diabetes  He presents for his follow-up diabetic visit. He has type 2 diabetes mellitus. His disease course has been stable. There are no hypoglycemic associated symptoms. Pertinent negatives for hypoglycemia include no nervousness/anxiousness or tremors. Pertinent negatives for diabetes include no blurred vision, no chest pain, no fatigue, no weakness and no weight loss. Symptoms are stable.  Hypertension  This is a chronic problem. The current episode started more than 1 year ago. The problem is unchanged. The problem is uncontrolled. Pertinent negatives include no blurred vision, chest pain, neck pain, palpitations or shortness of breath.     Current Medication: Outpatient Encounter Medications as of 08/03/2017  Medication Sig  . amLODipine (NORVASC) 10 MG tablet Take 1 tablet (10 mg total) by mouth daily.  Marland Kitchen atorvastatin (LIPITOR) 40 MG tablet Take 40 mg by mouth every evening.  . cetirizine (ZYRTEC) 10 MG tablet Take 10 mg by mouth daily.  . cloNIDine (CATAPRES) 0.1 MG tablet Take 1 tablet (0.1 mg total) by mouth 2 (two) times daily.  Marland Kitchen JANUVIA 100 MG tablet TAKE 1 TABLET BY MOUTH EVERY DAY  . metFORMIN (GLUCOPHAGE) 500 MG tablet Take 2 tablets (1,000 mg total) by mouth 2 (two) times daily.  . metoprolol tartrate (LOPRESSOR) 25 MG tablet Take 25 mg by mouth daily.  . Misc Natural Products (MENS POTENT FORMULA) TABS Take 1 tablet by mouth daily.  Berdine Addison 75 MG/0.5ML SOAJ ADMINISTER 0.5 ML INTRAMUSCULAR EVERY WEEK   No facility-administered encounter medications on file as of 08/03/2017.     Surgical History: Past Surgical History:  Procedure Laterality Date  .  partial intestine removed      Medical History: Past Medical History:  Diagnosis Date  . Diabetes mellitus without complication ([place])   . High cholesterol   . Hyperlipidemia   . Hypertension     Family History: Family History  Problem Relation Age of Onset  . Hypertension Father   . Diabetes Sister     Social History   Socioeconomic History  . Marital status: Married    Spouse name: Not on file  . Number of children: Not on file  . Years of education: Not on file  . Highest education level: Not on file  Occupational History  . Not on file  Social Needs  . Financial resource strain: Not on file  . Food insecurity:    Worry: Not on file    Inability: Not on file  . Transportation needs:    Medical: Not on file    Non-medical: Not on file  Tobacco Use  . Smoking status: Former Research scientist (life sciences)  . Smokeless tobacco: Never Used  Substance and Sexual Activity  . Alcohol use: Yes    Alcohol/week: 0.0 oz    Comment: social  . Drug use: Never  . Sexual activity: Not on file  Lifestyle  . Physical activity:    Days per week: Not on file    Minutes per session: Not on file  . Stress: Not on file  Relationships  . Social connections:    Talks on phone: Not on file    Gets together: Not on file  Attends religious service: Not on file    Active member of club or organization: Not on file    Attends meetings of clubs or organizations: Not on file    Relationship status: Not on file  . Intimate partner violence:    Fear of current or ex partner: Not on file    Emotionally abused: Not on file    Physically abused: Not on file    Forced sexual activity: Not on file  Other Topics Concern  . Not on file  Social History Narrative  . Not on file      Review of Systems  Constitutional: Negative.  Negative for chills, fatigue, unexpected weight change and weight loss.  HENT: Negative.  Negative for congestion, rhinorrhea, sneezing and sore throat.   Eyes: Negative for  blurred vision and redness.  Respiratory: Negative.  Negative for cough, chest tightness and shortness of breath.   Cardiovascular: Negative.  Negative for chest pain and palpitations.  Gastrointestinal: Negative.  Negative for abdominal pain, constipation, diarrhea, nausea and vomiting.  Endocrine: Negative.   Genitourinary: Negative.  Negative for dysuria and frequency.  Musculoskeletal: Negative.  Negative for arthralgias, back pain, joint swelling and neck pain.  Skin: Negative.  Negative for rash.  Allergic/Immunologic: Negative.   Neurological: Negative.  Negative for tremors, weakness and numbness.  Hematological: Negative for adenopathy. Does not bruise/bleed easily.  Psychiatric/Behavioral: Negative.  Negative for behavioral problems, sleep disturbance and suicidal ideas. The patient is not nervous/anxious.     Vital Signs: BP (!) 168/88   Pulse 88   Resp 16   Ht 5\' 7"  (1.702 m)   Wt 199 lb 9.6 oz (90.5 kg)   SpO2 98%   BMI 31.26 kg/m    Physical Exam  Constitutional: He is oriented to person, place, and time. He appears well-developed and well-nourished. No distress.  HENT:  Head: Normocephalic and atraumatic.  Mouth/Throat: Oropharynx is clear and moist. No oropharyngeal exudate.  Eyes: Pupils are equal, round, and reactive to light. EOM are normal.  Neck: Normal range of motion. Neck supple. No JVD present. No tracheal deviation present. No thyromegaly present.  Cardiovascular: Normal rate, regular rhythm and normal heart sounds. Exam reveals no gallop and no friction rub.  No murmur heard. Pulmonary/Chest: Effort normal and breath sounds normal. No respiratory distress. He has no wheezes. He has no rales. He exhibits no tenderness.  Abdominal: Soft. There is no tenderness. There is no guarding.  Musculoskeletal: Normal range of motion.  Lymphadenopathy:    He has no cervical adenopathy.  Neurological: He is alert and oriented to person, place, and time. No cranial  nerve deficit.  Skin: Skin is warm and dry. He is not diaphoretic.  Psychiatric: He has a normal mood and affect. His behavior is normal. Judgment and thought content normal.  Nursing note and vitals reviewed.   Assessment/Plan: 1. Diabetes mellitus without complication ([place]) Keep blood sugar log for next visit and bring with you.  Continue current medications.  - POCT HgB A1C  2. Malignant essential hypertension Continue current medications.  Keep BP log for next visit.   3. Screen for colon cancer Schedule colonoscopy.  General Counseling: jc veron understanding of the findings of todays visit and agrees with plan of treatment. I have discussed any further diagnostic evaluation that may be needed or ordered today. We also reviewed his medications today. he has been encouraged to call the office with any questions or concerns that should arise related to todays visit.  Orders Placed This Encounter  Procedures  . POCT HgB A1C    No orders of the defined types were placed in this encounter.   Time spent: 25 Minutes   This patient was seen by Orson Gear AGNP-C in Collaboration with Dr Lavera Guise as a part of collaborative care agreement    Dr Lavera Guise Internal medicine

## 2017-08-04 ENCOUNTER — Telehealth: Payer: Self-pay | Admitting: Gastroenterology

## 2017-08-04 NOTE — Telephone Encounter (Signed)
Gastroenterology Pre-Procedure Review  Request Date: 09/08/17   [facility] Requesting Physician: Dr. Allen Norris  PATIENT REVIEW QUESTIONS: The patient responded to the following health history questions as indicated:    1. Are you having any GI issues? no 2. Do you have a personal history of Polyps? no 3. Do you have a family history of Colon Cancer or Polyps? no 4. Diabetes Mellitus? yes (Type I) 5. Joint replacements in the past 12 months?no 6. Major health problems in the past 3 months?no 7. Any artificial heart valves, MVP, or defibrillator?no    MEDICATIONS & ALLERGIES:    Patient reports the following regarding taking any anticoagulation/antiplatelet therapy:   Plavix, Coumadin, Eliquis, Xarelto, Lovenox, Pradaxa, Brilinta, or Effient? no Aspirin? no  Patient confirms/reports the following medications:  Current Outpatient Medications  Medication Sig Dispense Refill  . ACCU-CHEK AVIVA PLUS test strip Please specify directions, refills and quantity 100 each 1  . ACCU-CHEK FASTCLIX LANCETS MISC Use as directed twice daily dia E11.65 100 each 1  . amLODipine (NORVASC) 10 MG tablet Take 1 tablet (10 mg total) by mouth daily. 30 tablet 0  . atorvastatin (LIPITOR) 40 MG tablet Take 40 mg by mouth every evening.  5  . Blood Glucose Monitoring Suppl (ACCU-CHEK AVIVA PLUS) w/Device KIT Use as directed 1 kit 0  . cetirizine (ZYRTEC) 10 MG tablet Take 10 mg by mouth daily.    . cloNIDine (CATAPRES) 0.1 MG tablet Take 1 tablet (0.1 mg total) by mouth 2 (two) times daily. 60 tablet 11  . JANUVIA 100 MG tablet TAKE 1 TABLET BY MOUTH EVERY DAY 90 tablet 0  . metFORMIN (GLUCOPHAGE) 500 MG tablet Take 2 tablets (1,000 mg total) by mouth 2 (two) times daily. 60 tablet 0  . metoprolol tartrate (LOPRESSOR) 25 MG tablet Take 25 mg by mouth daily.  3  . Misc Natural Products (MENS POTENT FORMULA) TABS Take 1 tablet by mouth daily.    Berdine Addison 75 MG/0.5ML SOAJ ADMINISTER 0.5 ML INTRAMUSCULAR EVERY WEEK  5   No  current facility-administered medications for this visit.     Patient confirms/reports the following allergies:  Allergies  Allergen Reactions  . Lisinopril Swelling  . Ace Inhibitors Swelling  . Sulfa Antibiotics Swelling    No orders of the defined types were placed in this encounter.   AUTHORIZATION INFORMATION Primary Insurance: 1D#: Group #:  Secondary Insurance: 1D#: Group #:  SCHEDULE INFORMATION: Date: 09/08/17    Allen Norris  Time: Location: [place]

## 2017-08-07 ENCOUNTER — Other Ambulatory Visit: Payer: Self-pay

## 2017-08-07 DIAGNOSIS — Z1211 Encounter for screening for malignant neoplasm of colon: Secondary | ICD-10-CM

## 2017-08-07 MED ORDER — NA SULFATE-K SULFATE-MG SULF 17.5-3.13-1.6 GM/177ML PO SOLN: 1.0000 | Freq: Once | ORAL | 0 refills | Status: AC

## 2017-08-10 ENCOUNTER — Other Ambulatory Visit: Payer: Self-pay | Admitting: Internal Medicine

## 2017-08-11 ENCOUNTER — Other Ambulatory Visit: Payer: Self-pay

## 2017-08-11 MED ORDER — METFORMIN HCL 500 MG PO TABS: 1000.0000 mg | ORAL_TABLET | Freq: Two times a day (BID) | ORAL | 5 refills | Status: DC

## 2017-08-14 ENCOUNTER — Encounter: Payer: Self-pay | Admitting: Adult Health

## 2017-08-23 ENCOUNTER — Other Ambulatory Visit: Payer: Self-pay | Admitting: Internal Medicine

## 2017-08-24 ENCOUNTER — Other Ambulatory Visit: Payer: Self-pay

## 2017-08-24 MED ORDER — METOPROLOL TARTRATE 25 MG PO TABS: 25.0000 mg | ORAL_TABLET | Freq: Every day | ORAL | 1 refills | Status: DC

## 2017-09-04 ENCOUNTER — Other Ambulatory Visit: Payer: Self-pay

## 2017-09-04 MED ORDER — AMLODIPINE BESYLATE 10 MG PO TABS: 10.0000 mg | ORAL_TABLET | Freq: Every day | ORAL | 0 refills | Status: DC

## 2017-09-04 MED ORDER — SITAGLIPTIN PHOSPHATE 100 MG PO TABS: 100.0000 mg | ORAL_TABLET | Freq: Every day | ORAL | 0 refills | Status: DC

## 2017-09-05 ENCOUNTER — Encounter: Payer: Self-pay | Admitting: *Deleted

## 2017-09-05 ENCOUNTER — Other Ambulatory Visit: Payer: Self-pay

## 2017-09-06 ENCOUNTER — Other Ambulatory Visit: Payer: Self-pay

## 2017-09-06 MED ORDER — LOSARTAN POTASSIUM 50 MG PO TABS: ORAL_TABLET | ORAL | 1 refills | Status: DC

## 2017-09-06 MED ORDER — HYDROCHLOROTHIAZIDE 12.5 MG PO CAPS: ORAL_CAPSULE | ORAL | 1 refills | Status: DC

## 2017-09-06 MED ORDER — METFORMIN HCL 500 MG PO TABS: 1000.0000 mg | ORAL_TABLET | Freq: Two times a day (BID) | ORAL | 2 refills | Status: DC

## 2017-09-06 NOTE — Telephone Encounter (Signed)
error 

## 2017-09-07 NOTE — Discharge Instructions (Signed)
General Anesthesia, Adult, Care After °These instructions provide you with information about caring for yourself after your procedure. Your health care provider may also give you more specific instructions. Your treatment has been planned according to current medical practices, but problems sometimes occur. Call your health care provider if you have any problems or questions after your procedure. °What can I expect after the procedure? °After the procedure, it is common to have: °· Vomiting. °· A sore throat. °· Mental slowness. ° °It is common to feel: °· Nauseous. °· Cold or shivery. °· Sleepy. °· Tired. °· Sore or achy, even in parts of your body where you did not have surgery. ° °Follow these instructions at home: °For at least 24 hours after the procedure: °· Do not: °? Participate in activities where you could fall or become injured. °? Drive. °? Use heavy machinery. °? Drink alcohol. °? Take sleeping pills or medicines that cause drowsiness. °? Make important decisions or sign legal documents. °? Take care of children on your own. °· Rest. °Eating and drinking °· If you vomit, drink water, juice, or soup when you can drink without vomiting. °· Drink enough fluid to keep your urine clear or pale yellow. °· Make sure you have little or no nausea before eating solid foods. °· Follow the diet recommended by your health care provider. °General instructions °· Have a responsible adult stay with you until you are awake and alert. °· Return to your normal activities as told by your health care provider. Ask your health care provider what activities are safe for you. °· Take over-the-counter and prescription medicines only as told by your health care provider. °· If you smoke, do not smoke without supervision. °· Keep all follow-up visits as told by your health care provider. This is important. °Contact a health care provider if: °· You continue to have nausea or vomiting at home, and medicines are not helpful. °· You  cannot drink fluids or start eating again. °· You cannot urinate after 8-12 hours. °· You develop a skin rash. °· You have fever. °· You have increasing redness at the site of your procedure. °Get help right away if: °· You have difficulty breathing. °· You have chest pain. °· You have unexpected bleeding. °· You feel that you are having a life-threatening or urgent problem. °This information is not intended to replace advice given to you by your health care provider. Make sure you discuss any questions you have with your health care provider. °Document Released: 04/04/2000 Document Revised: 06/01/2015 Document Reviewed: 12/11/2014 °Elsevier Interactive Patient Education © 2018 Elsevier Inc. ° °

## 2017-09-08 ENCOUNTER — Ambulatory Visit: Payer: BC Managed Care – PPO | Admitting: Anesthesiology

## 2017-09-08 ENCOUNTER — Ambulatory Visit
Admission: RE | Admit: 2017-09-08 | Discharge: 2017-09-08 | Disposition: A | Discharge status: Home / Self Care | Payer: BC Managed Care – PPO | Source: Ambulatory Visit | Attending: Gastroenterology | Admitting: Gastroenterology

## 2017-09-08 ENCOUNTER — Encounter
Admission: RE | Disposition: A | Discharge status: Home / Self Care | Payer: Self-pay | Source: Ambulatory Visit | Attending: Gastroenterology

## 2017-09-08 DIAGNOSIS — K5730 Diverticulosis of large intestine without perforation or abscess without bleeding: Secondary | ICD-10-CM | POA: Insufficient documentation

## 2017-09-08 DIAGNOSIS — K621 Rectal polyp: Secondary | ICD-10-CM | POA: Diagnosis not present

## 2017-09-08 DIAGNOSIS — Z881 Allergy status to other antibiotic agents status: Secondary | ICD-10-CM | POA: Insufficient documentation

## 2017-09-08 DIAGNOSIS — Z87891 Personal history of nicotine dependence: Secondary | ICD-10-CM | POA: Insufficient documentation

## 2017-09-08 DIAGNOSIS — K641 Second degree hemorrhoids: Secondary | ICD-10-CM | POA: Diagnosis not present

## 2017-09-08 DIAGNOSIS — Z79899 Other long term (current) drug therapy: Secondary | ICD-10-CM | POA: Diagnosis not present

## 2017-09-08 DIAGNOSIS — E7800 Pure hypercholesterolemia, unspecified: Secondary | ICD-10-CM | POA: Diagnosis not present

## 2017-09-08 DIAGNOSIS — Z882 Allergy status to sulfonamides status: Secondary | ICD-10-CM | POA: Diagnosis not present

## 2017-09-08 DIAGNOSIS — I10 Essential (primary) hypertension: Secondary | ICD-10-CM | POA: Diagnosis not present

## 2017-09-08 DIAGNOSIS — D125 Benign neoplasm of sigmoid colon: Secondary | ICD-10-CM | POA: Diagnosis not present

## 2017-09-08 DIAGNOSIS — D127 Benign neoplasm of rectosigmoid junction: Secondary | ICD-10-CM | POA: Diagnosis not present

## 2017-09-08 DIAGNOSIS — Z888 Allergy status to other drugs, medicaments and biological substances status: Secondary | ICD-10-CM | POA: Diagnosis not present

## 2017-09-08 DIAGNOSIS — Z7984 Long term (current) use of oral hypoglycemic drugs: Secondary | ICD-10-CM | POA: Diagnosis not present

## 2017-09-08 DIAGNOSIS — D124 Benign neoplasm of descending colon: Secondary | ICD-10-CM | POA: Diagnosis not present

## 2017-09-08 DIAGNOSIS — K219 Gastro-esophageal reflux disease without esophagitis: Secondary | ICD-10-CM | POA: Diagnosis not present

## 2017-09-08 DIAGNOSIS — Z1211 Encounter for screening for malignant neoplasm of colon: Secondary | ICD-10-CM | POA: Diagnosis present

## 2017-09-08 DIAGNOSIS — E119 Type 2 diabetes mellitus without complications: Secondary | ICD-10-CM | POA: Insufficient documentation

## 2017-09-08 DIAGNOSIS — K635 Polyp of colon: Secondary | ICD-10-CM

## 2017-09-08 HISTORY — PX: POLYPECTOMY: SHX5525

## 2017-09-08 HISTORY — DX: Nontraumatic intracerebral hemorrhage, unspecified: I61.9

## 2017-09-08 HISTORY — DX: Gastro-esophageal reflux disease without esophagitis: K21.9

## 2017-09-08 HISTORY — PX: COLONOSCOPY WITH PROPOFOL: SHX5780

## 2017-09-08 LAB — GLUCOSE, CAPILLARY
GLUCOSE-CAPILLARY: 152 mg/dL — AB (ref 70–99)
Glucose-Capillary: 153 mg/dL — ABNORMAL HIGH (ref 70–99)

## 2017-09-08 SURGERY — COLONOSCOPY WITH PROPOFOL
Anesthesia: General | Wound class: Clean Contaminated

## 2017-09-08 MED ORDER — STERILE WATER FOR IRRIGATION IR SOLN
Status: DC | PRN
  Administered 2017-09-08: 08:00:00

## 2017-09-08 MED ORDER — LIDOCAINE HCL (CARDIAC) PF 100 MG/5ML IV SOSY
PREFILLED_SYRINGE | INTRAVENOUS | Status: DC | PRN
  Administered 2017-09-08: 40 mg via INTRAVENOUS

## 2017-09-08 MED ORDER — ACETAMINOPHEN 160 MG/5ML PO SOLN: 325.0000 mg | ORAL | Status: DC | PRN

## 2017-09-08 MED ORDER — ACETAMINOPHEN 325 MG PO TABS: 325.0000 mg | ORAL_TABLET | ORAL | Status: DC | PRN

## 2017-09-08 MED ORDER — PROPOFOL 10 MG/ML IV BOLUS
INTRAVENOUS | Status: DC | PRN
  Administered 2017-09-08: 50 mg via INTRAVENOUS
  Administered 2017-09-08 (×2): 30 mg via INTRAVENOUS
  Administered 2017-09-08: 40 mg via INTRAVENOUS
  Administered 2017-09-08: 50 mg via INTRAVENOUS
  Administered 2017-09-08: 20 mg via INTRAVENOUS
  Administered 2017-09-08 (×2): 50 mg via INTRAVENOUS
  Administered 2017-09-08: 100 mg via INTRAVENOUS

## 2017-09-08 MED ORDER — LACTATED RINGERS IV SOLN
INTRAVENOUS | Status: DC
  Administered 2017-09-08: 07:00:00 via INTRAVENOUS

## 2017-09-08 MED ORDER — SODIUM CHLORIDE 0.9 % IV SOLN: INTRAVENOUS | Status: DC

## 2017-09-08 SURGICAL SUPPLY — 24 items
CANISTER SUCT 1200ML W/VALVE (MISCELLANEOUS) ×4 IMPLANT
CLIP HMST 235XBRD CATH ROT (MISCELLANEOUS) IMPLANT
CLIP RESOLUTION 360 11X235 (MISCELLANEOUS)
ELECT REM PT RETURN 9FT ADLT (ELECTROSURGICAL)
ELECTRODE REM PT RTRN 9FT ADLT (ELECTROSURGICAL) IMPLANT
FCP ESCP3.2XJMB 240X2.8X (MISCELLANEOUS)
FORCEPS BIOP RAD 4 LRG CAP 4 (CUTTING FORCEPS) IMPLANT
FORCEPS BIOP RJ4 240 W/NDL (MISCELLANEOUS)
FORCEPS ESCP3.2XJMB 240X2.8X (MISCELLANEOUS) IMPLANT
GOWN CVR UNV OPN BCK APRN NK (MISCELLANEOUS) ×4 IMPLANT
GOWN ISOL THUMB LOOP REG UNIV (MISCELLANEOUS) ×4
INJECTOR VARIJECT VIN23 (MISCELLANEOUS) IMPLANT
KIT DEFENDO VALVE AND CONN (KITS) IMPLANT
KIT ENDO PROCEDURE OLY (KITS) ×4 IMPLANT
MARKER SPOT ENDO TATTOO 5ML (MISCELLANEOUS) IMPLANT
PROBE APC STR FIRE (PROBE) IMPLANT
RETRIEVER NET ROTH 2.5X230 LF (MISCELLANEOUS) IMPLANT
SNARE SHORT THROW 13M SML OVAL (MISCELLANEOUS) ×4 IMPLANT
SNARE SHORT THROW 30M LRG OVAL (MISCELLANEOUS) IMPLANT
SNARE SNG USE RND 15MM (INSTRUMENTS) IMPLANT
SPOT EX ENDOSCOPIC TATTOO (MISCELLANEOUS)
TRAP ETRAP POLY (MISCELLANEOUS) ×4 IMPLANT
VARIJECT INJECTOR VIN23 (MISCELLANEOUS)
WATER STERILE IRR 250ML POUR (IV SOLUTION) ×4 IMPLANT

## 2017-09-08 NOTE — Op Note (Signed)
[facility] Gastroenterology Patient Name: Joshua Donovan Procedure Date: 09/08/2017 7:32 AM MRN: [id number] Account #: [id number] Date of Birth: 12-13-1955 Admit Type: Outpatient Age: 62 Room: [facility] OR ROOM 01 Gender: Male Note Status: Finalized Procedure:            Colonoscopy Indications:          Screening for colorectal malignant neoplasm Providers:            Lucilla Lame MD, MD Referring MD:         Lavera Guise, MD (Referring MD) Medicines:            Propofol per Anesthesia Complications:        No immediate complications. Procedure:            Pre-Anesthesia Assessment:                       - Prior to the procedure, a History and Physical was                        performed, and patient medications and allergies were                        reviewed. The patient's tolerance of previous                        anesthesia was also reviewed. The risks and benefits of                        the procedure and the sedation options and risks were                        discussed with the patient. All questions were                        answered, and informed consent was obtained. Prior                        Anticoagulants: The patient has taken no previous                        anticoagulant or antiplatelet agents. ASA Grade                        Assessment: II - A patient with mild systemic disease.                        After reviewing the risks and benefits, the patient was                        deemed in satisfactory condition to undergo the                        procedure.                       After obtaining informed consent, the colonoscope was                        passed under direct vision. Throughout the procedure,  the patient's blood pressure, pulse, and oxygen                        saturations were monitored continuously. The was                        introduced through the anus and advanced to the the            cecum, identified by appendiceal orifice and ileocecal                        valve. The colonoscopy was performed without                        difficulty. The patient tolerated the procedure well.                        The quality of the bowel preparation was excellent. Findings:      The perianal and digital rectal examinations were normal.      Three sessile polyps were found in the descending colon. The polyps were       3 to 4 mm in size. These polyps were removed with a cold snare.       Resection and retrieval were complete.      Two sessile polyps were found in the sigmoid colon. The polyps were 2 to       4 mm in size. These polyps were removed with a cold snare. Resection and       retrieval were complete.      A 5 mm polyp was found in the rectum. The polyp was sessile. The polyp       was removed with a cold snare. Resection and retrieval were complete.      Multiple small-mouthed diverticula were found in the entire colon.      Non-bleeding internal hemorrhoids were found during retroflexion. The       hemorrhoids were Grade II (internal hemorrhoids that prolapse but reduce       spontaneously). Impression:           - Three 3 to 4 mm polyps in the descending colon,                        removed with a cold snare. Resected and retrieved.                       - Two 2 to 4 mm polyps in the sigmoid colon, removed                        with a cold snare. Resected and retrieved.                       - One 5 mm polyp in the rectum, removed with a cold                        snare. Resected and retrieved.                       - Diverticulosis in the entire examined colon.                       -  Non-bleeding internal hemorrhoids. Recommendation:       - Discharge patient to home.                       - Resume previous diet.                       - Continue present medications.                       - Await pathology results.                       - Repeat  colonoscopy in 5 years if polyp adenoma and 10                        years if hyperplastic Procedure Code(s):    --- Professional ---                       [phone], Colonoscopy, flexible; with removal of tumor(s),                        polyp(s), or other lesion(s) by snare technique Diagnosis Code(s):    --- Professional ---                       Z12.11, Encounter for screening for malignant neoplasm                        of colon                       D12.4, Benign neoplasm of descending colon                       D12.5, Benign neoplasm of sigmoid colon                       K62.1, Rectal polyp CPT copyright 2017 American Medical Association. All rights reserved. The codes documented in this report are preliminary and upon coder review may  be revised to meet current compliance requirements. Lucilla Lame MD, MD 09/08/2017 8:03:14 AM This report has been signed electronically. Number of Addenda: 0 Note Initiated On: 09/08/2017 7:32 AM Scope Withdrawal Time: 0 hours 15 minutes 13 seconds  Total Procedure Duration: 0 hours 17 minutes 12 seconds       [facility]

## 2017-09-08 NOTE — Anesthesia Procedure Notes (Signed)
Procedure Name: MAC Date/Time: 09/08/2017 7:38 AM Performed by: Janna Arch, CRNA Pre-anesthesia Checklist: Patient identified, Emergency Drugs available, Suction available and Patient being monitored Patient Re-evaluated:Patient Re-evaluated prior to induction Oxygen Delivery Method: Nasal cannula

## 2017-09-08 NOTE — Anesthesia Preprocedure Evaluation (Signed)
Anesthesia Evaluation  Patient identified by MRN, date of birth, ID band Patient awake    Reviewed: Allergy & Precautions, H&P , NPO status , Patient's Chart, lab work & pertinent test results, reviewed documented beta blocker date and time   Airway Mallampati: I  TM Distance: >3 FB Neck ROM: full    Dental no notable dental hx.    Pulmonary former smoker,    Pulmonary exam normal breath sounds clear to auscultation       Cardiovascular Exercise Tolerance: Good hypertension, Normal cardiovascular exam Rhythm:regular Rate:Normal     Neuro/Psych negative neurological ROS  negative psych ROS   GI/Hepatic Neg liver ROS, GERD  Medicated,  Endo/Other  diabetes  Renal/GU negative Renal ROS  negative genitourinary   Musculoskeletal   Abdominal   Peds  Hematology negative hematology ROS (+)   Anesthesia Other Findings   Reproductive/Obstetrics negative OB ROS                             Anesthesia Physical Anesthesia Plan  ASA: II  Anesthesia Plan: General   Post-op Pain Management:    Induction:   PONV Risk Score and Plan:   Airway Management Planned:   Additional Equipment:   Intra-op Plan:   Post-operative Plan:   Informed Consent: I have reviewed the patients History and Physical, chart, labs and discussed the procedure including the risks, benefits and alternatives for the proposed anesthesia with the patient or authorized representative who has indicated his/her understanding and acceptance.   Dental Advisory Given  Plan Discussed with: CRNA  Anesthesia Plan Comments:         Anesthesia Quick Evaluation

## 2017-09-08 NOTE — H&P (Signed)
Lucilla Lame, MD [facility] [address], [place], [place] [address] Phone: [phone] Fax : [phone]  Primary Care Physician:  Ronnell Freshwater, NP Primary Gastroenterologist:  Dr. Allen Norris  Pre-Procedure History & Physical: HPI:  Joshua Donovan is a 62 y.o. male is here for a screening colonoscopy.   Past Medical History:  Diagnosis Date  . Diabetes mellitus without complication ([place])   . GERD (gastroesophageal reflux disease)   . 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 High cholesterol   . Hyperlipidemia   . Hypertension     Past Surgical History:  Procedure Laterality Date  . partial intestine removed     approx date: 1980    Prior to Admission medications   Medication Sig Start Date End Date Taking? Authorizing Provider  amLODipine (NORVASC) 10 MG tablet Take 1 tablet (10 mg total) by mouth daily. 09/04/17  Yes Boscia, Heather E, NP  atorvastatin (LIPITOR) 40 MG tablet TAKE 1 TABLET BY MOUTH EVERY DAY 08/24/17  Yes Boscia, Heather E, NP  cetirizine (ZYRTEC) 10 MG tablet Take 10 mg by mouth daily.   Yes [provider]  cloNIDine (CATAPRES) 0.1 MG tablet Take 1 tablet (0.1 mg total) by mouth 2 (two) times daily. 07/21/15  Yes Max Sane, MD  hydrochlorothiazide (MICROZIDE) 12.5 MG capsule Take one capsule by mouth daily 09/06/17  Yes Boscia, Greer Ee, NP  losartan (COZAAR) 50 MG tablet Take one tablet by mouth daily 09/06/17  Yes Boscia, Heather E, NP  metFORMIN (GLUCOPHAGE) 500 MG tablet TAKE 2 TABLETS BY MOUTH TWICE A DAY 08/11/17  Yes [place], Audie Clear, NP  metFORMIN (GLUCOPHAGE) 500 MG tablet Take 2 tablets (1,000 mg total) by mouth 2 (two) times daily. 09/06/17  Yes Ronnell Freshwater, NP  metoprolol tartrate (LOPRESSOR) 25 MG tablet Take 1 tablet (25 mg total) by mouth daily. 08/24/17  Yes Boscia, Heather E, NP  sitaGLIPtin (JANUVIA) 100 MG tablet Take 1 tablet (100 mg total) by mouth daily. 09/04/17  Yes Boscia, Greer Ee, NP  tadalafil (CIALIS) 5 MG tablet  Take 5 mg by mouth daily.   Yes [provider]  ACCU-CHEK AVIVA PLUS test strip Please specify directions, refills and quantity 08/03/17   Lavera Guise, MD  ACCU-CHEK FASTCLIX LANCETS MISC Use as directed twice daily dia E11.65 08/03/17   Lavera Guise, MD  Blood Glucose Monitoring Suppl (ACCU-CHEK AVIVA PLUS) w/Device KIT Use as directed 08/03/17   Lavera Guise, MD  XYOSTED 40 MG/0.5ML SOAJ ADMINISTER 0.5 ML INTRAMUSCULAR EVERY WEEK 07/18/17   [provider]    Allergies as of 08/07/2017 - Review Complete 07/19/2015  Allergen Reaction Noted  . Lisinopril Swelling 07/21/2015  . Ace inhibitors Swelling 07/20/2015  . Sulfa antibiotics Swelling 07/19/2015    Family History  Problem Relation Age of Onset  . Hypertension Father   . Diabetes Sister     Social History   Socioeconomic History  . Marital status: Married    Spouse name: Not on file  . Number of children: Not on file  . Years of education: Not on file  . Highest education level: Not on file  Occupational History  . Not on file  Social Needs  . Financial resource strain: Not on file  . Food insecurity:    Worry: Not on file    Inability: Not on file  . Transportation needs:    Medical: Not on file    Non-medical: Not on file  Tobacco Use  .  Smoking status: Former Smoker    Last attempt to quit: 1990    Years since quitting: 29.6  . Smokeless tobacco: Never Used  Substance and Sexual Activity  . Alcohol use: Yes    Alcohol/week: 7.0 standard drinks    Types: 7 Cans of beer per week    Comment: social  . Drug use: Never  . Sexual activity: Not on file  Lifestyle  . Physical activity:    Days per week: Not on file    Minutes per session: Not on file  . Stress: Not on file  Relationships  . Social connections:    Talks on phone: Not on file    Gets together: Not on file    Attends religious service: Not on file    Active member of club or organization: Not on file    Attends meetings of  clubs or organizations: Not on file    Relationship status: Not on file  . Intimate partner violence:    Fear of current or ex partner: Not on file    Emotionally abused: Not on file    Physically abused: Not on file    Forced sexual activity: Not on file  Other Topics Concern  . Not on file  Social History Narrative  . Not on file    Review of Systems: See HPI, otherwise negative ROS  Physical Exam: BP (!) 151/88   Pulse 70   Temp (!) 97.3 F (36.3 C) (Temporal)   Ht 5' 7"  (1.702 m)   Wt 86.6 kg   SpO2 100%   BMI 29.91 kg/m  General:   Alert,  pleasant and cooperative in NAD Head:  Normocephalic and atraumatic. Neck:  Supple; no masses or thyromegaly. Lungs:  Clear throughout to auscultation.    Heart:  Regular rate and rhythm. Abdomen:  Soft, nontender and nondistended. Normal bowel sounds, without guarding, and without rebound.   Neurologic:  Alert and  oriented x4;  grossly normal neurologically.  Impression/Plan: Joshua Donovan is now here to undergo a screening colonoscopy.  Risks, benefits, and alternatives regarding colonoscopy have been reviewed with the patient.  Questions have been answered.  All parties agreeable.

## 2017-09-08 NOTE — Transfer of Care (Signed)
Immediate Anesthesia Transfer of Care Note  Patient: Joshua Donovan  Procedure(s) Performed: COLONOSCOPY WITH PROPOFOL (N/A ) POLYPECTOMY  Patient Location: PACU  Anesthesia Type: General  Level of Consciousness: awake, alert  and patient cooperative  Airway and Oxygen Therapy: Patient Spontanous Breathing and Patient connected to supplemental oxygen  Post-op Assessment: Post-op Vital signs reviewed, Patient's Cardiovascular Status Stable, Respiratory Function Stable, Patent Airway and No signs of Nausea or vomiting  Post-op Vital Signs: Reviewed and stable  Complications: No apparent anesthesia complications

## 2017-09-08 NOTE — Anesthesia Postprocedure Evaluation (Signed)
Anesthesia Post Note  Patient: Joshua Donovan  Procedure(s) Performed: COLONOSCOPY WITH PROPOFOL (N/A ) POLYPECTOMY  Patient location during evaluation: PACU Anesthesia Type: General Level of consciousness: awake and alert Pain management: pain level controlled Vital Signs Assessment: post-procedure vital signs reviewed and stable Respiratory status: spontaneous breathing, nonlabored ventilation, respiratory function stable and patient connected to nasal cannula oxygen Cardiovascular status: blood pressure returned to baseline and stable Postop Assessment: no apparent nausea or vomiting Anesthetic complications: no    Trecia Rogers

## 2017-09-12 ENCOUNTER — Encounter: Payer: Self-pay | Admitting: Gastroenterology

## 2017-09-13 ENCOUNTER — Other Ambulatory Visit: Payer: Self-pay

## 2017-09-14 ENCOUNTER — Encounter: Payer: Self-pay | Admitting: Gastroenterology

## 2017-09-26 ENCOUNTER — Other Ambulatory Visit: Payer: Self-pay

## 2017-10-04 ENCOUNTER — Other Ambulatory Visit: Payer: Self-pay

## 2017-10-04 DIAGNOSIS — E1165 Type 2 diabetes mellitus with hyperglycemia: Secondary | ICD-10-CM

## 2017-10-04 MED ORDER — AMLODIPINE BESYLATE 10 MG PO TABS: 10.0000 mg | ORAL_TABLET | Freq: Every day | ORAL | 0 refills | Status: DC

## 2017-10-04 MED ORDER — LOSARTAN POTASSIUM 50 MG PO TABS: ORAL_TABLET | ORAL | 1 refills | Status: DC

## 2017-10-04 MED ORDER — SITAGLIPTIN PHOSPHATE 100 MG PO TABS: 100.0000 mg | ORAL_TABLET | Freq: Every day | ORAL | 0 refills | Status: DC

## 2017-10-04 MED ORDER — CLONIDINE HCL 0.1 MG PO TABS: 0.1000 mg | ORAL_TABLET | Freq: Two times a day (BID) | ORAL | 0 refills | Status: DC

## 2017-10-04 MED ORDER — HYDROCHLOROTHIAZIDE 12.5 MG PO CAPS: ORAL_CAPSULE | ORAL | 1 refills | Status: DC

## 2017-10-04 MED ORDER — GLUCOSE BLOOD VI STRP: ORAL_STRIP | 3 refills | Status: DC

## 2017-10-04 MED ORDER — CETIRIZINE HCL 10 MG PO TABS: 10.0000 mg | ORAL_TABLET | Freq: Every day | ORAL | 1 refills | Status: DC

## 2017-10-04 MED ORDER — METFORMIN HCL 500 MG PO TABS: 1000.0000 mg | ORAL_TABLET | Freq: Two times a day (BID) | ORAL | 2 refills | Status: DC

## 2017-10-04 MED ORDER — ATORVASTATIN CALCIUM 40 MG PO TABS: 40.0000 mg | ORAL_TABLET | Freq: Every day | ORAL | 1 refills | Status: DC

## 2017-10-04 MED ORDER — METOPROLOL TARTRATE 25 MG PO TABS: 25.0000 mg | ORAL_TABLET | Freq: Every day | ORAL | 1 refills | Status: DC

## 2017-10-04 MED ORDER — ACCU-CHEK AVIVA PLUS W/DEVICE KIT: PACK | 0 refills | Status: DC

## 2017-10-04 NOTE — Telephone Encounter (Signed)
Pt is NO LONGER using [facility] He has changed to [facility] in [place],  I am resending all prescriptions to this pharmacy.

## 2017-10-05 ENCOUNTER — Other Ambulatory Visit: Payer: Self-pay

## 2017-10-05 DIAGNOSIS — E1165 Type 2 diabetes mellitus with hyperglycemia: Secondary | ICD-10-CM

## 2017-10-05 MED ORDER — GLUCOSE BLOOD VI STRP: ORAL_STRIP | 3 refills | Status: DC

## 2017-11-06 ENCOUNTER — Ambulatory Visit (INDEPENDENT_AMBULATORY_CARE_PROVIDER_SITE_OTHER): Payer: BC Managed Care – PPO | Admitting: Adult Health

## 2017-11-06 ENCOUNTER — Encounter: Payer: Self-pay | Admitting: Adult Health

## 2017-11-06 ENCOUNTER — Other Ambulatory Visit: Payer: Self-pay | Admitting: Adult Health

## 2017-11-06 VITALS — BP 180/108 | HR 90 | Resp 16 | Ht 67.0 in | Wt 200.0 lb

## 2017-11-06 DIAGNOSIS — R300 Dysuria: Secondary | ICD-10-CM

## 2017-11-06 DIAGNOSIS — E1165 Type 2 diabetes mellitus with hyperglycemia: Secondary | ICD-10-CM | POA: Diagnosis not present

## 2017-11-06 DIAGNOSIS — Z125 Encounter for screening for malignant neoplasm of prostate: Secondary | ICD-10-CM

## 2017-11-06 DIAGNOSIS — Z0001 Encounter for general adult medical examination with abnormal findings: Secondary | ICD-10-CM | POA: Diagnosis not present

## 2017-11-06 DIAGNOSIS — N529 Male erectile dysfunction, unspecified: Secondary | ICD-10-CM

## 2017-11-06 DIAGNOSIS — I10 Essential (primary) hypertension: Secondary | ICD-10-CM | POA: Diagnosis not present

## 2017-11-06 LAB — POCT GLYCOSYLATED HEMOGLOBIN (HGB A1C): Hemoglobin A1C: 6.6 % — AB (ref 4.0–5.6)

## 2017-11-06 MED ORDER — PNEUMOCOCCAL 13-VAL CONJ VACC IM SUSP: 0.5000 mL | INTRAMUSCULAR | 0 refills | Status: AC

## 2017-11-06 MED ORDER — AMLODIPINE BESYLATE 10 MG PO TABS: 10.0000 mg | ORAL_TABLET | Freq: Every day | ORAL | 3 refills | Status: DC

## 2017-11-06 NOTE — Progress Notes (Signed)
[facility] [place], [place] [address]  Internal MEDICINE  Office Visit Note  Patient Name: Joshua Donovan  [id number]  [id number]  Date of Service: 11/06/2017  Chief Complaint  Patient presents with  . Annual Exam  . Diabetes  . Hypertension  . Hyperlipidemia  . Quality Metric Gaps    foot exam . eye exam     HPI Pt is here for routine health maintenance examination he is a 62 year old African-American male.  He has a history of diabetes, hypertension, hyperlipidemia and erectile dysfunction.   Current Medication: Outpatient Encounter Medications as of 11/06/2017  Medication Sig  . ACCU-CHEK FASTCLIX LANCETS MISC Use as directed twice daily dia E11.65  . amLODipine (NORVASC) 10 MG tablet Take 1 tablet (10 mg total) by mouth daily.  Marland Kitchen atorvastatin (LIPITOR) 40 MG tablet Take 1 tablet (40 mg total) by mouth daily.  . Blood Glucose Monitoring Suppl (ACCU-CHEK AVIVA PLUS) w/Device KIT Use as directed  . cetirizine (ZYRTEC) 10 MG tablet Take 1 tablet (10 mg total) by mouth daily.  . cloNIDine (CATAPRES) 0.1 MG tablet Take 1 tablet (0.1 mg total) by mouth 2 (two) times daily.  Marland Kitchen glucose blood (ACCU-CHEK AVIVA PLUS) test strip Use two times daily to check blood sugar  . hydrochlorothiazide (MICROZIDE) 12.5 MG capsule Take one capsule by mouth daily  . losartan (COZAAR) 50 MG tablet Take one tablet by mouth daily  . metFORMIN (GLUCOPHAGE) 500 MG tablet Take 2 tablets (1,000 mg total) by mouth 2 (two) times daily.  . metoprolol tartrate (LOPRESSOR) 25 MG tablet Take 1 tablet (25 mg total) by mouth daily.  . sitaGLIPtin (JANUVIA) 100 MG tablet Take 1 tablet (100 mg total) by mouth daily.  . tadalafil (CIALIS) 5 MG tablet Take 5 mg by mouth daily.  . XYOSTED 75 MG/0.5ML SOAJ ADMINISTER 0.5 ML INTRAMUSCULAR EVERY WEEK  . [DISCONTINUED] amLODipine (NORVASC) 10 MG tablet Take 1 tablet (10 mg total) by mouth daily.   No facility-administered encounter medications  on file as of 11/06/2017.     Surgical History: Past Surgical History:  Procedure Laterality Date  . COLONOSCOPY WITH PROPOFOL N/A 09/08/2017   Procedure: COLONOSCOPY WITH PROPOFOL;  Surgeon: Lucilla Lame, MD;  Location: [facility];  Service: Endoscopy;  Laterality: N/A;  Diabetic - oral meds  . partial intestine removed     approx date: 1980  . POLYPECTOMY  09/08/2017   Procedure: POLYPECTOMY;  Surgeon: Lucilla Lame, MD;  Location: [facility];  Service: Endoscopy;;    Medical History: Past Medical History:  Diagnosis Date  . Diabetes mellitus without complication ([place])   . GERD (gastroesophageal reflux disease)   . 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 High cholesterol   . Hyperlipidemia   . Hypertension     Family History: Family History  Problem Relation Age of Onset  . Hypertension Father   . Diabetes Sister       Review of Systems  Constitutional: Negative.  Negative for chills, fatigue and unexpected weight change.  HENT: Negative.  Negative for congestion, rhinorrhea, sneezing and sore throat.   Eyes: Negative for redness.  Respiratory: Negative.  Negative for cough, chest tightness and shortness of breath.   Cardiovascular: Negative.  Negative for chest pain and palpitations.  Gastrointestinal: Negative.  Negative for abdominal pain, constipation, diarrhea, nausea and vomiting.  Endocrine: Negative.   Genitourinary: Negative.  Negative for dysuria and frequency.  Musculoskeletal: Negative.  Negative for arthralgias, back pain, joint  swelling and neck pain.  Skin: Negative.  Negative for rash.  Allergic/Immunologic: Negative.   Neurological: Negative.  Negative for tremors and numbness.  Hematological: Negative for adenopathy. Does not bruise/bleed easily.  Psychiatric/Behavioral: Negative.  Negative for behavioral problems, sleep disturbance and suicidal ideas. The patient is not nervous/anxious.      Vital Signs: BP (!) 180/108   Pulse  90   Resp 16   Ht 5' 7"  (1.702 m)   Wt 200 lb (90.7 kg)   SpO2 95%   BMI 31.32 kg/m    Physical Exam  Constitutional: He is oriented to person, place, and time. He appears well-developed and well-nourished. No distress.  HENT:  Head: Normocephalic and atraumatic.  Mouth/Throat: Oropharynx is clear and moist. No oropharyngeal exudate.  Eyes: Pupils are equal, round, and reactive to light. EOM are normal.  Neck: Normal range of motion. Neck supple. No JVD present. No tracheal deviation present. No thyromegaly present.  Cardiovascular: Normal rate, regular rhythm and normal heart sounds. Exam reveals no gallop and no friction rub.  No murmur heard. Pulmonary/Chest: Effort normal and breath sounds normal. No respiratory distress. He has no wheezes. He has no rales. He exhibits no tenderness.  Abdominal: Soft. There is no tenderness. There is no guarding.  Musculoskeletal: Normal range of motion.  Lymphadenopathy:    He has no cervical adenopathy.  Neurological: He is alert and oriented to person, place, and time. No cranial nerve deficit.  Skin: Skin is warm and dry. He is not diaphoretic.  Psychiatric: He has a normal mood and affect. His behavior is normal. Judgment and thought content normal.  Nursing note and vitals reviewed.    LABS: Recent Results (from the past 2160 hour(s))  Glucose, capillary     Status: Abnormal   Collection Time: 09/08/17  6:49 AM  Result Value Ref Range   Glucose-Capillary 153 (H) 70 - 99 mg/dL  Glucose, capillary     Status: Abnormal   Collection Time: 09/08/17  8:07 AM  Result Value Ref Range   Glucose-Capillary 152 (H) 70 - 99 mg/dL  POCT HgB A1C     Status: Abnormal   Collection Time: 11/06/17  3:10 PM  Result Value Ref Range   Hemoglobin A1C 6.6 (A) 4.0 - 5.6 %   HbA1c POC (<> result, manual entry)     HbA1c, POC (prediabetic range)     HbA1c, POC (controlled diabetic range)     Assessment/Plan: 1. Encounter for general adult medical  examination with abnormal findings Up to date on PHM except eye exam which he will call and make an appt.  - CBC with Differential/Platelet - Lipid Panel With LDL/HDL Ratio - TSH - T4, free - Comprehensive metabolic panel  2. Uncontrolled type 2 diabetes mellitus with hyperglycemia (HCC) HgA1C 6.6 - POCT HgB A1C Diabetic Foot exam performed.  Diabetes Counseling:  1. Addition of ACE inh/ ARB'S for nephroprotection. Microalbumin is updated  2. Diabetic foot care, prevention of complications. Podiatry consult 3. Exercise and lose weight.  4. Diabetic eye examination, Diabetic eye exam is updated  5. Monitor blood sugar closlely. nutrition counseling.  6. Sign and symptoms of hypoglycemia including shaking sweating,confusion and headaches.  3. Malignant essential hypertension Patient blood pressure elevated at this visit.  Repeat BP 170/82.   Hypertension Counseling:   The following hypertensive lifestyle modification were recommended and discussed:  1. Limiting alcohol intake to less than 1 oz/day of ethanol:(24 oz of beer or 8 oz of wine or  2 oz of 100-proof whiskey). 2. Take baby ASA 81 mg daily. 3. Importance of regular aerobic exercise and losing weight. 4. Reduce dietary saturated fat and cholesterol intake for overall cardiovascular health. 5. Maintaining adequate dietary potassium, calcium, and magnesium intake. 6. Regular monitoring of the blood pressure. 7. Reduce sodium intake to less than 100 mmol/day (less than 2.3 gm of sodium or less than 6 gm of sodium choride)   4. Screening for prostate cancer - PSA  5. Dysuria - UA/M w/rflx Culture, Routine  6. Erectile dysfunction, unspecified erectile dysfunction type Patient will be seeing urology in the next few weeks.  Testosterone prolactin levels ordered to have on file.  Patient was on testosterone injections however has not had any for a few months.  He is currently taking Cialis daily as per his urologist instructions.   Will follow with patient after urology appointment. - Testosterone,Free and Total - Prolactin  General Counseling: oryn casanova understanding of the findings of todays visit and agrees with plan of treatment. I have discussed any further diagnostic evaluation that may be needed or ordered today. We also reviewed his medications today. he has been encouraged to call the office with any questions or concerns that should arise related to todays visit.   Orders Placed This Encounter  Procedures  . UA/M w/rflx Culture, Routine  . CBC with Differential/Platelet  . Lipid Panel With LDL/HDL Ratio  . TSH  . T4, free  . Comprehensive metabolic panel  . PSA  . Testosterone,Free and Total  . Prolactin  . POCT HgB A1C    Meds ordered this encounter  Medications  . amLODipine (NORVASC) 10 MG tablet    Sig: Take 1 tablet (10 mg total) by mouth daily.    Dispense:  30 tablet    Refill:  3    Time spent: 25 Minutes   This patient was seen by Orson Gear AGNP-C in Collaboration with Dr Lavera Guise as a part of collaborative care agreement    Kendell Bane AGNP-C Internal Medicine

## 2017-11-06 NOTE — Patient Instructions (Signed)

## 2017-11-07 LAB — UA/M W/RFLX CULTURE, ROUTINE
BILIRUBIN UA: NEGATIVE
KETONES UA: NEGATIVE
Leukocytes, UA: NEGATIVE
Nitrite, UA: NEGATIVE
Protein, UA: NEGATIVE
Specific Gravity, UA: 1.016 (ref 1.005–1.030)
UUROB: 0.2 mg/dL (ref 0.2–1.0)
pH, UA: 7 (ref 5.0–7.5)

## 2017-11-07 LAB — MICROSCOPIC EXAMINATION
Bacteria, UA: NONE SEEN
Casts: NONE SEEN /lpf

## 2017-11-08 ENCOUNTER — Telehealth: Payer: Self-pay | Admitting: Adult Health

## 2017-11-08 NOTE — Telephone Encounter (Signed)
Order change from prevnar 13 to pneumococcal 23

## 2017-11-20 ENCOUNTER — Other Ambulatory Visit: Payer: Self-pay | Admitting: Adult Health

## 2017-11-20 ENCOUNTER — Other Ambulatory Visit: Payer: Self-pay | Admitting: Nurse Practitioner

## 2017-12-18 ENCOUNTER — Ambulatory Visit: Payer: Self-pay | Admitting: Nurse Practitioner

## 2018-02-07 ENCOUNTER — Other Ambulatory Visit: Payer: Self-pay

## 2018-02-07 DIAGNOSIS — I10 Essential (primary) hypertension: Secondary | ICD-10-CM

## 2018-02-07 MED ORDER — AMLODIPINE BESYLATE 10 MG PO TABS: 10.0000 mg | ORAL_TABLET | Freq: Every day | ORAL | 0 refills | Status: DC

## 2018-03-06 IMAGING — MR MR HEAD W/O CM
10 series · 48 of 48 positions shown · non-contrast
Comparison: MRI 04/05/2014.  No recent imaging of the brain.

CLINICAL DATA: Fall 4 weeks ago.  Right frontal hemorrhage

EXAM:
MRI HEAD WITHOUT CONTRAST
TECHNIQUE: Multiplanar, multiecho pulse sequences of the brain and surrounding
structures were obtained without intravenous contrast.

[Series 2: T1 · sagittal · 5.0mm · 0.45mm/px · 3 of 27 slices shown (1 of 2)]
[im 1/27]
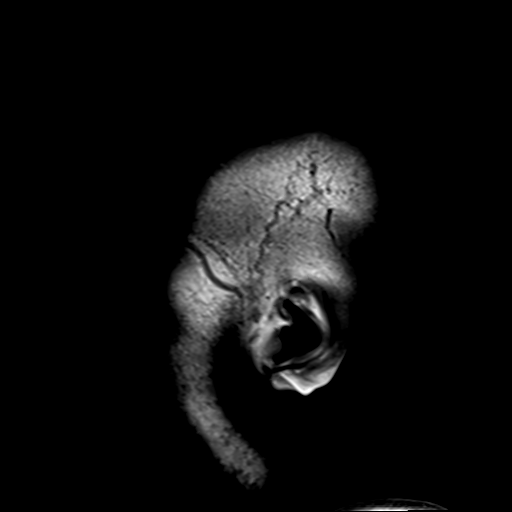
[im 14/27]
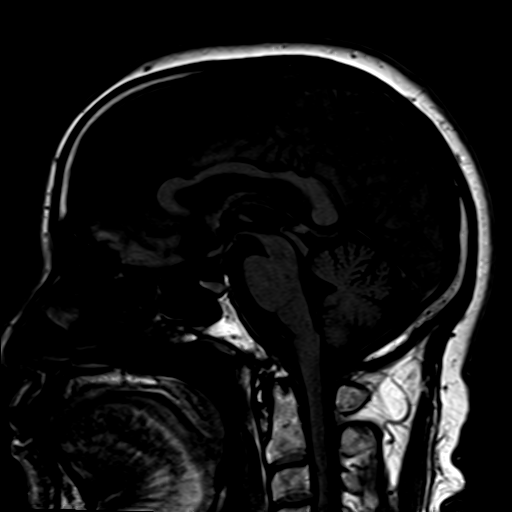
[im 27/27]
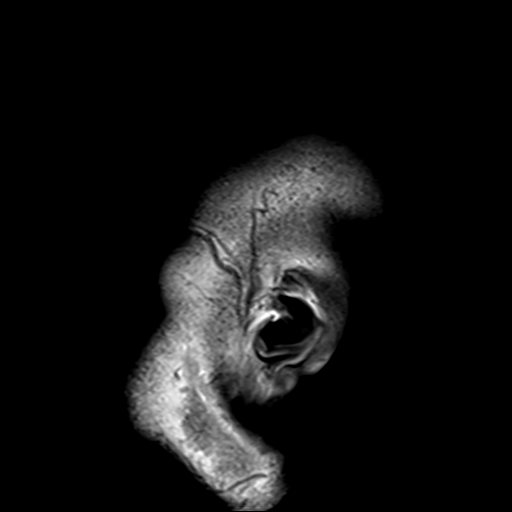

[Series 4: DWI · axial · 3.0mm · 1.80mm/px · z∈[-59,+101]mm · 7 of 55 slices shown (1 of 2)]
[im 1/55]
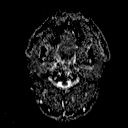
[im 10/55]
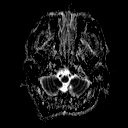
[im 19/55]
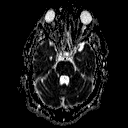
[im 28/55]
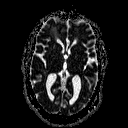
[im 37/55]
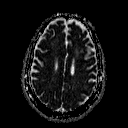
[im 46/55]
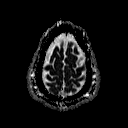
[im 55/55]
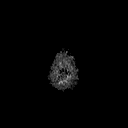

[Series 6: DWI · coronal · 3.0mm · 1.80mm/px · 6 of 47 slices shown (2 of 2)]
[im 1/47]
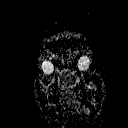
[im 10/47]
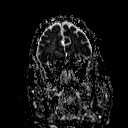
[im 19/47]
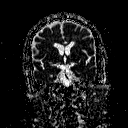
[im 28/47]
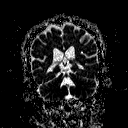
[im 37/47]
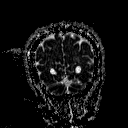
[im 47/47]
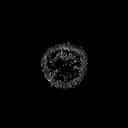

[Series 7: T2 · axial · 5.0mm · 0.60mm/px · z∈[-61,+106]mm · 3 of 27 slices shown (1 of 3)]
[im 1/27]
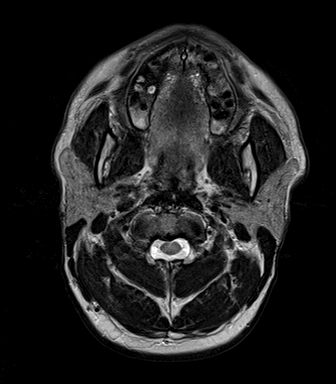
[im 14/27]
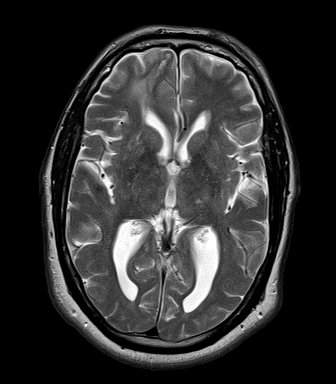
[im 27/27]
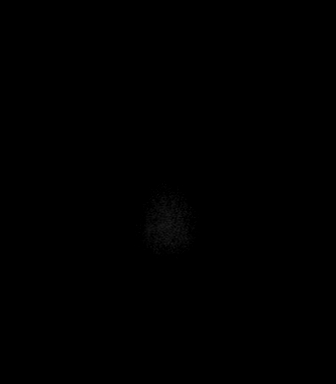

[Series 8: FLAIR · axial · 5.0mm · 0.45mm/px · z∈[-61,+106]mm · 3 of 27 slices shown]
[im 1/27]
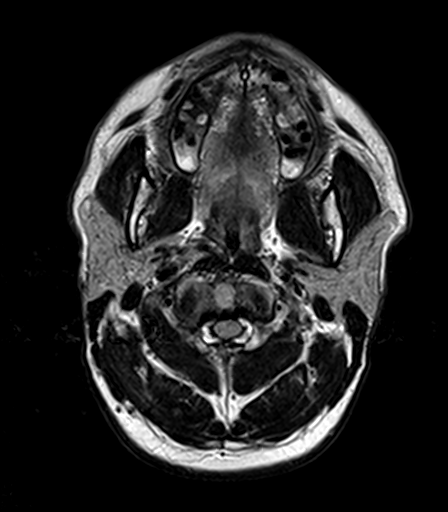
[im 14/27]
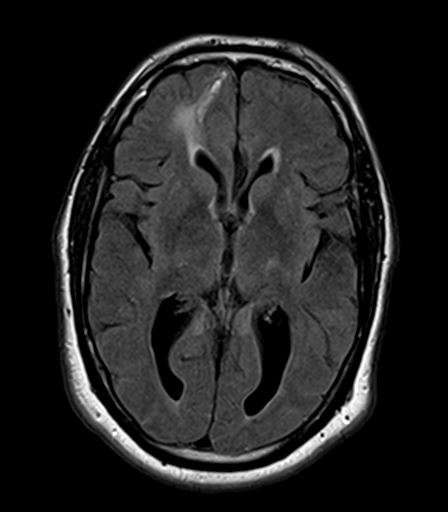
[im 27/27]
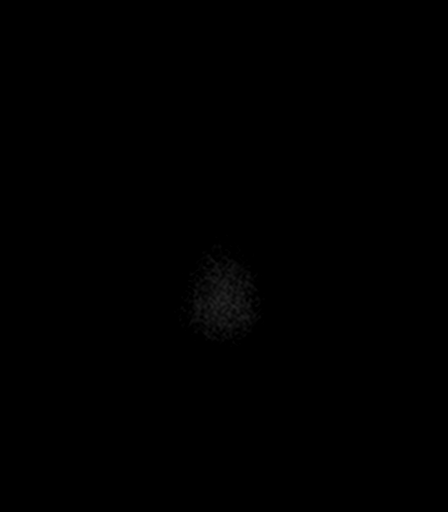

[Series 9: T2 · axial · 5.0mm · 0.45mm/px · z∈[-61,+106]mm · 3 of 27 slices shown (2 of 3)]
[im 1/27]
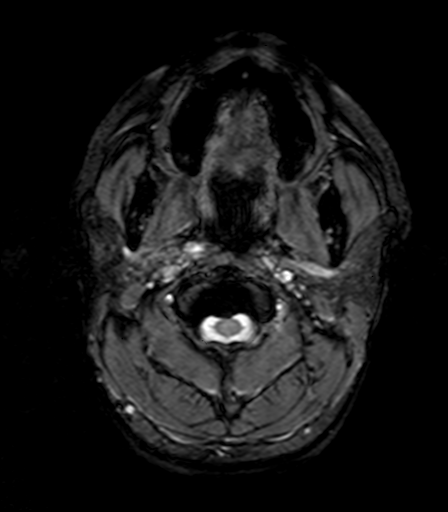
[im 14/27]
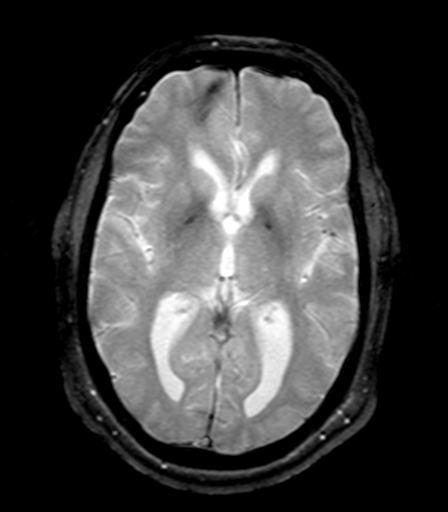
[im 27/27]
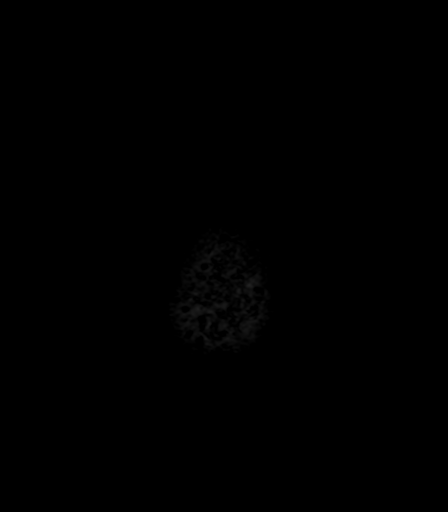

[Series 10: T1 · axial · 3.0mm · 1.00mm/px · z∈[-64,+112]mm · 7 of 60 slices shown (2 of 2)]
[im 1/60]
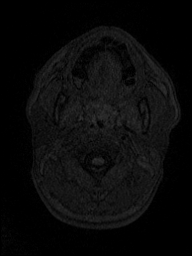
[im 10/60]
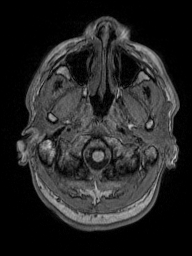
[im 20/60]
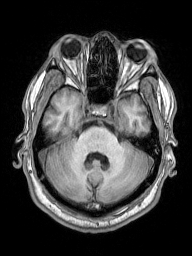
[im 30/60]
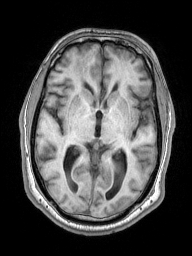
[im 40/60]
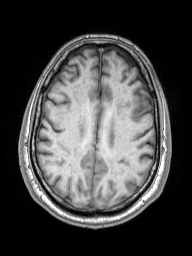
[im 50/60]
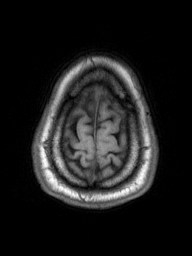
[im 60/60]
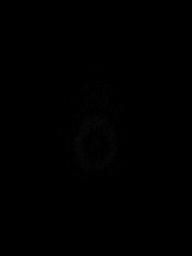

[Series 11: T2 · coronal · 5.0mm · 0.49mm/px · 3 of 27 slices shown (3 of 3)]
[im 1/27]
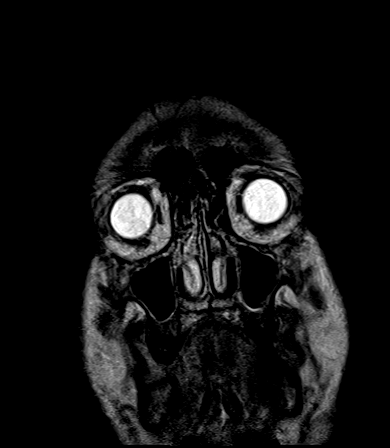
[im 14/27]
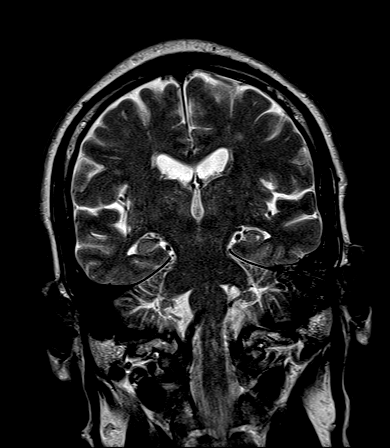
[im 27/27]
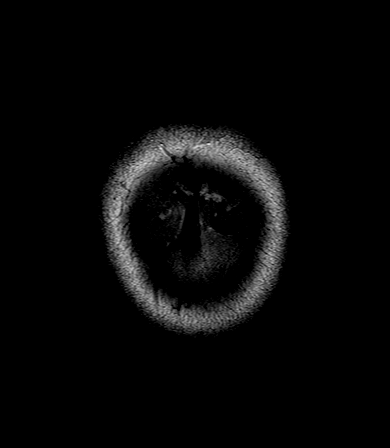

[Series 100: (id) · axial · 3.0mm · 1.80mm/px · z∈[-59,+101]mm · 7 of 55 slices shown]
[im 1/55]
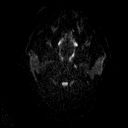
[im 10/55]
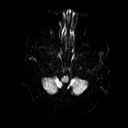
[im 19/55]
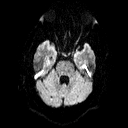
[im 28/55]
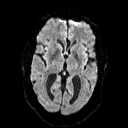
[im 37/55]
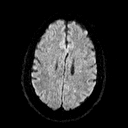
[im 46/55]
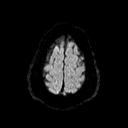
[im 55/55]
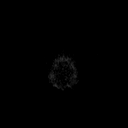

[Series 101: (id) cor · coronal · 3.0mm · 1.80mm/px · 6 of 46 slices shown]
[im 1/46]
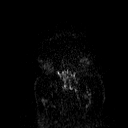
[im 10/46]
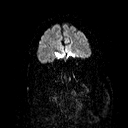
[im 19/46]
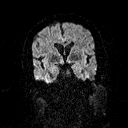
[im 28/46]
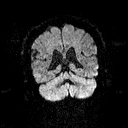
[im 37/46]
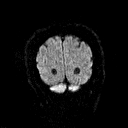
[im 46/46]
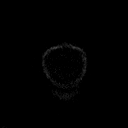

[48 of 48 positions shown; findings below may reference images not displayed]

FINDINGS: Brain: Subacute hematoma in the right inferior frontal lobe measures
13 x 33 mm on axial images. There is central methemoglobin with
surrounding hemosiderin. Mild surrounding white matter edema.
Chronic microhemorrhage right cerebellum unchanged from the prior
MRI. No subdural hematoma.

Mild atrophy. Negative for hydrocephalus. No shift of the midline
structures.

Negative for acute infarct. Mild chronic microvascular ischemic
change in the white matter.

Vascular: Normal flow voids.

Skull and upper cervical spine: Negative

Sinuses/Orbits: Mild mucosal edema in the paranasal sinuses. Normal
orbital contents.

Other: Normal soft tissues.
IMPRESSION: Right frontal lobe intra-axial subacute hematoma measuring 13 x 33
mm. Correlate with prior imaging. Mild surrounding edema. No shift
of the midline structures

Negative for acute infarct.

These results will be called to the ordering clinician or
representative by the Radiologist Assistant, and communication
documented in the PACS or zVision Dashboard.

## 2018-04-05 ENCOUNTER — Other Ambulatory Visit: Payer: Self-pay

## 2018-04-05 MED ORDER — SITAGLIPTIN PHOSPHATE 100 MG PO TABS: 100.0000 mg | ORAL_TABLET | Freq: Every day | ORAL | 0 refills | Status: DC

## 2018-04-12 ENCOUNTER — Other Ambulatory Visit: Payer: Self-pay

## 2018-04-12 MED ORDER — SITAGLIPTIN PHOSPHATE 100 MG PO TABS: 100.0000 mg | ORAL_TABLET | Freq: Every day | ORAL | 0 refills | Status: DC

## 2018-05-13 ENCOUNTER — Other Ambulatory Visit: Payer: Self-pay | Admitting: Adult Health

## 2018-05-13 DIAGNOSIS — I10 Essential (primary) hypertension: Secondary | ICD-10-CM

## 2018-05-14 ENCOUNTER — Telehealth: Payer: Self-pay | Admitting: Nurse Practitioner

## 2018-05-14 NOTE — Telephone Encounter (Signed)
Left message patient needs appointment for further refills

## 2018-05-24 ENCOUNTER — Other Ambulatory Visit: Payer: Self-pay

## 2018-05-24 ENCOUNTER — Encounter: Payer: Self-pay | Admitting: Nurse Practitioner

## 2018-05-24 ENCOUNTER — Ambulatory Visit (INDEPENDENT_AMBULATORY_CARE_PROVIDER_SITE_OTHER): Payer: BC Managed Care – PPO | Admitting: Nurse Practitioner

## 2018-05-24 VITALS — Ht 67.0 in | Wt 200.0 lb

## 2018-05-24 DIAGNOSIS — I10 Essential (primary) hypertension: Secondary | ICD-10-CM | POA: Diagnosis not present

## 2018-05-24 DIAGNOSIS — E1165 Type 2 diabetes mellitus with hyperglycemia: Secondary | ICD-10-CM

## 2018-05-24 DIAGNOSIS — E119 Type 2 diabetes mellitus without complications: Secondary | ICD-10-CM | POA: Diagnosis not present

## 2018-05-24 MED ORDER — METFORMIN HCL 500 MG PO TABS: 1000.0000 mg | ORAL_TABLET | Freq: Two times a day (BID) | ORAL | 2 refills | Status: DC

## 2018-05-24 NOTE — Progress Notes (Signed)
[facility] [place], [place] [address]  Internal MEDICINE  Telephone Visit  Patient Name: Joshua Donovan  [id number]  [id number]  Date of Service: 06/10/2018  I connected with the patient at 11:18am by telephone and verified the patients identity using two identifiers.   I discussed the limitations, risks, security and privacy concerns of performing an evaluation and management service by telephone and the availability of in person appointments. I also discussed with the patient that there may be a patient responsible charge related to the service.  The patient expressed understanding and agrees to proceed.    Chief Complaint  Patient presents with  . Telephone Screen    PHONE VISIT [phone] LINE 2  . Telephone Assessment  . Medical Management of Chronic Issues    follow up medication refills pt requesting a 90day supply  . Hypertension  . Hyperlipidemia  . Gastroesophageal Reflux  . Diabetes    The patient has been contacted via telephone for follow up visit due to concerns for spread of novel coronavirus. The patient states that his blood sugars are dong well. They are generally under 150 when checked. Takes his metformin as prescribed. He states he has no concerns or complaints. He did have labs ordered at his last visit and has not had them drawn yet. He does needd to have a refill for his metformin today.        Current Medication: Outpatient Encounter Medications as of 05/24/2018  Medication Sig  . ACCU-CHEK FASTCLIX LANCETS MISC Use as directed twice daily dia E11.65  . amLODipine (NORVASC) 10 MG tablet Take 1 tablet by mouth once daily  . atorvastatin (LIPITOR) 40 MG tablet Take 1 tablet (40 mg total) by mouth daily.  . Blood Glucose Monitoring Suppl (ACCU-CHEK AVIVA PLUS) w/Device KIT Use as directed  . cetirizine (ZYRTEC) 10 MG tablet TAKE 1 TABLET BY MOUTH ONCE DAILY  . cloNIDine (CATAPRES) 0.1 MG tablet TAKE 1 TABLET BY MOUTH TWICE DAILY   . glucose blood (ACCU-CHEK AVIVA PLUS) test strip Use two times daily to check blood sugar  . hydrochlorothiazide (MICROZIDE) 12.5 MG capsule Take one capsule by mouth daily  . losartan (COZAAR) 50 MG tablet Take one tablet by mouth daily  . metFORMIN (GLUCOPHAGE) 500 MG tablet Take 2 tablets (1,000 mg total) by mouth 2 (two) times daily.  . metoprolol tartrate (LOPRESSOR) 25 MG tablet Take 1 tablet (25 mg total) by mouth daily.  . pneumococcal 23 valent vaccine (PNEUMOVAX 23) 25 MCG/0.5ML injection Inject 0.5 mLs into the muscle tomorrow at 10 am.  . sitaGLIPtin (JANUVIA) 100 MG tablet Take 1 tablet (100 mg total) by mouth daily.  . tadalafil (CIALIS) 5 MG tablet Take 5 mg by mouth daily.  . XYOSTED 75 MG/0.5ML SOAJ ADMINISTER 0.5 ML INTRAMUSCULAR EVERY WEEK  . [DISCONTINUED] metFORMIN (GLUCOPHAGE) 500 MG tablet TAKE 2 TABLETS BY MOUTH TWICE DAILY   No facility-administered encounter medications on file as of 05/24/2018.     Surgical History: Past Surgical History:  Procedure Laterality Date  . COLONOSCOPY WITH PROPOFOL N/A 09/08/2017   Procedure: COLONOSCOPY WITH PROPOFOL;  Surgeon: Lucilla Lame, MD;  Location: [facility];  Service: Endoscopy;  Laterality: N/A;  Diabetic - oral meds  . partial intestine removed     approx date: 1980  . POLYPECTOMY  09/08/2017   Procedure: POLYPECTOMY;  Surgeon: Lucilla Lame, MD;  Location: [facility];  Service: Endoscopy;;    Medical History: Past Medical History:  Diagnosis  Date  . Diabetes mellitus without complication ([place])   . GERD (gastroesophageal reflux disease)   . Hemorrhage in the brain ([place]) 08/2015  . High cholesterol   . Hyperlipidemia   . Hypertension     Family History: Family History  Problem Relation Age of Onset  . Hypertension Father   . Diabetes Sister     Social History   Socioeconomic History  . Marital status: Married    Spouse name: Not on file  . Number of children: Not on file  . Years of  education: Not on file  . Highest education level: Not on file  Occupational History  . Not on file  Social Needs  . Financial resource strain: Not on file  . Food insecurity:    Worry: Not on file    Inability: Not on file  . Transportation needs:    Medical: Not on file    Non-medical: Not on file  Tobacco Use  . Smoking status: Former Smoker    Last attempt to quit: 1990    Years since quitting: 30.4  . Smokeless tobacco: Never Used  Substance and Sexual Activity  . Alcohol use: Yes    Alcohol/week: 7.0 standard drinks    Types: 7 Cans of beer per week    Comment: social  . Drug use: Never  . Sexual activity: Not on file  Lifestyle  . Physical activity:    Days per week: Not on file    Minutes per session: Not on file  . Stress: Not on file  Relationships  . Social connections:    Talks on phone: Not on file    Gets together: Not on file    Attends religious service: Not on file    Active member of club or organization: Not on file    Attends meetings of clubs or organizations: Not on file    Relationship status: Not on file  . Intimate partner violence:    Fear of current or ex partner: Not on file    Emotionally abused: Not on file    Physically abused: Not on file    Forced sexual activity: Not on file  Other Topics Concern  . Not on file  Social History Narrative  . Not on file      Review of Systems  Constitutional: Negative for chills, fatigue and unexpected weight change.  HENT: Negative for congestion, rhinorrhea, sneezing and sore throat.   Respiratory: Negative for cough, chest tightness, shortness of breath and wheezing.   Cardiovascular: Negative for chest pain and palpitations.  Gastrointestinal: Negative for abdominal pain, constipation, diarrhea, nausea and vomiting.  Endocrine: Negative for cold intolerance, heat intolerance, polydipsia and polyuria.       Blood sugars doing well   Musculoskeletal: Negative for arthralgias, back pain, joint  swelling and neck pain.  Skin: Negative for rash.  Allergic/Immunologic: Negative for environmental allergies.  Neurological: Negative for dizziness, tremors, numbness and headaches.  Hematological: Negative for adenopathy. Does not bruise/bleed easily.  Psychiatric/Behavioral: Negative for behavioral problems, sleep disturbance and suicidal ideas. The patient is not nervous/anxious.     Today's Vitals   05/24/18 1112  Weight: 200 lb (90.7 kg)  Height: _0  (1.702 m)   Body mass index is 31.32 kg/m.  Observation/Objective:  The patient is alert and oriented. He is pleasant and answering all questions appropriately. Breathing is non-labored. He is in no acute distress.    Assessment/Plan: 1. Diabetes mellitus without complication ([place]) Overall well controlled.  Continue metformin as prescribed. Check HgbA1c at next in-office visit.  - metFORMIN (GLUCOPHAGE) 500 MG tablet; Take 2 tablets (1,000 mg total) by mouth 2 (two) times daily.  Dispense: 180 tablet; Refill: 2  2. Essential hypertension Stable. Continue bo medication as prescribed    General Counseling: evian salguero understanding of the findings of today's phone visit and agrees with plan of treatment. I have discussed any further diagnostic evaluation that may be needed or ordered today. We also reviewed his medications today. he has been encouraged to call the office with any questions or concerns that should arise related to todays visit.  Diabetes Counseling:  1. Addition of ACE inh/ ARB'S for nephroprotection. Microalbumin is updated  2. Diabetic foot care, prevention of complications. Podiatry consult 3. Exercise and lose weight.  4. Diabetic eye examination, Diabetic eye exam is updated  5. Monitor blood sugar closlely. nutrition counseling.  6. Sign and symptoms of hypoglycemia including shaking sweating,confusion and headaches.   This patient was seen by [place] with Dr Lavera Guise  as a part of collaborative care agreement  Meds ordered this encounter  Medications  . metFORMIN (GLUCOPHAGE) 500 MG tablet    Sig: Take 2 tablets (1,000 mg total) by mouth 2 (two) times daily.    Dispense:  180 tablet    Refill:  2    Please consider 90 day supplies to promote better adherence    Order Specific Question:   Supervising Provider    Answer:   Lavera Guise [[id number]]    Time spent: 25 Minutes    Dr Lavera Guise Internal medicine

## 2018-06-10 DIAGNOSIS — E1165 Type 2 diabetes mellitus with hyperglycemia: Secondary | ICD-10-CM | POA: Insufficient documentation

## 2018-06-14 ENCOUNTER — Other Ambulatory Visit: Payer: Self-pay | Admitting: Adult Health

## 2018-06-14 DIAGNOSIS — I10 Essential (primary) hypertension: Secondary | ICD-10-CM

## 2018-06-15 ENCOUNTER — Other Ambulatory Visit: Payer: Self-pay

## 2018-06-15 MED ORDER — METOPROLOL TARTRATE 25 MG PO TABS: 25.0000 mg | ORAL_TABLET | Freq: Every day | ORAL | 1 refills | Status: DC

## 2018-06-18 ENCOUNTER — Other Ambulatory Visit: Payer: Self-pay

## 2018-07-06 ENCOUNTER — Other Ambulatory Visit: Payer: Self-pay

## 2018-07-06 MED ORDER — HYDROCHLOROTHIAZIDE 12.5 MG PO CAPS: ORAL_CAPSULE | ORAL | 1 refills | Status: DC

## 2018-07-10 ENCOUNTER — Ambulatory Visit: Payer: BC Managed Care – PPO | Admitting: Nurse Practitioner

## 2018-07-10 ENCOUNTER — Encounter: Payer: Self-pay | Admitting: Nurse Practitioner

## 2018-07-10 ENCOUNTER — Telehealth: Payer: Self-pay

## 2018-07-10 ENCOUNTER — Other Ambulatory Visit: Payer: Self-pay

## 2018-07-10 VITALS — BP 168/98 | HR 70 | Temp 97.9°F | Resp 16 | Ht 67.0 in | Wt 205.0 lb

## 2018-07-10 DIAGNOSIS — E1165 Type 2 diabetes mellitus with hyperglycemia: Secondary | ICD-10-CM | POA: Diagnosis not present

## 2018-07-10 DIAGNOSIS — E782 Mixed hyperlipidemia: Secondary | ICD-10-CM | POA: Diagnosis not present

## 2018-07-10 DIAGNOSIS — I10 Essential (primary) hypertension: Secondary | ICD-10-CM

## 2018-07-10 LAB — POCT GLYCOSYLATED HEMOGLOBIN (HGB A1C): Hemoglobin A1C: 6.9 % — AB (ref 4.0–5.6)

## 2018-07-10 MED ORDER — HYDROCHLOROTHIAZIDE 12.5 MG PO CAPS: 25.0000 mg | ORAL_CAPSULE | Freq: Every day | ORAL | 1 refills | Status: DC

## 2018-07-10 NOTE — Progress Notes (Signed)
Pt blood pressure elevated, taken twice, 1st reading 182/115 2nd reading 182/102 Informed provider

## 2018-07-10 NOTE — Progress Notes (Signed)
[facility] [place], [place] [address]  Internal MEDICINE  Office Visit Note  Patient Name: Joshua Donovan  [id number]  [id number]  Date of Service: 07/10/2018  Chief Complaint  Patient presents with  . Medical Management of Chronic Issues    6wk follow up  . Diabetes    A1C  . Hypertension    The patient is here for follow up diabetes and hypertension. Blood sugars are doing well. hgbA1c 6.9 today. Blood pressure moderately elevated. States that blood pressure is always high. Does take all medications as prescribed. He states that he feels well and has no concerns or complaints today.       Current Medication: Outpatient Encounter Medications as of 07/10/2018  Medication Sig  . ACCU-CHEK FASTCLIX LANCETS MISC Use as directed twice daily dia E11.65  . amLODipine (NORVASC) 10 MG tablet Take 1 tablet by mouth once daily  . atorvastatin (LIPITOR) 40 MG tablet Take 1 tablet (40 mg total) by mouth daily.  . Blood Glucose Monitoring Suppl (ACCU-CHEK AVIVA PLUS) w/Device KIT Use as directed  . cetirizine (ZYRTEC) 10 MG tablet TAKE 1 TABLET BY MOUTH ONCE DAILY  . cloNIDine (CATAPRES) 0.1 MG tablet TAKE 1 TABLET BY MOUTH TWICE DAILY  . glucose blood (ACCU-CHEK AVIVA PLUS) test strip Use two times daily to check blood sugar  . hydrochlorothiazide (MICROZIDE) 12.5 MG capsule Take 2 capsules (25 mg total) by mouth daily.  Marland Kitchen losartan (COZAAR) 50 MG tablet Take one tablet by mouth daily  . metFORMIN (GLUCOPHAGE) 500 MG tablet Take 2 tablets (1,000 mg total) by mouth 2 (two) times daily.  . metoprolol tartrate (LOPRESSOR) 25 MG tablet Take 1 tablet (25 mg total) by mouth daily.  . pneumococcal 23 valent vaccine (PNEUMOVAX 23) 25 MCG/0.5ML injection Inject 0.5 mLs into the muscle tomorrow at 10 am.  . sitaGLIPtin (JANUVIA) 100 MG tablet Take 1 tablet (100 mg total) by mouth daily.  . tadalafil (CIALIS) 5 MG tablet Take 5 mg by mouth daily.  . XYOSTED 75 MG/0.5ML SOAJ  ADMINISTER 0.5 ML INTRAMUSCULAR EVERY WEEK  . [DISCONTINUED] hydrochlorothiazide (MICROZIDE) 12.5 MG capsule Take one capsule by mouth daily   No facility-administered encounter medications on file as of 07/10/2018.     Surgical History: Past Surgical History:  Procedure Laterality Date  . COLONOSCOPY WITH PROPOFOL N/A 09/08/2017   Procedure: COLONOSCOPY WITH PROPOFOL;  Surgeon: Lucilla Lame, MD;  Location: [facility];  Service: Endoscopy;  Laterality: N/A;  Diabetic - oral meds  . partial intestine removed     approx date: 1980  . POLYPECTOMY  09/08/2017   Procedure: POLYPECTOMY;  Surgeon: Lucilla Lame, MD;  Location: [facility];  Service: Endoscopy;;    Medical History: Past Medical History:  Diagnosis Date  . Diabetes mellitus without complication ([place])   . GERD (gastroesophageal reflux disease)   . Hemorrhage in the brain ([place]) 08/2015  . High cholesterol   . Hyperlipidemia   . Hypertension     Family History: Family History  Problem Relation Age of Onset  . Hypertension Father   . Diabetes Sister     Social History   Socioeconomic History  . Marital status: Married    Spouse name: Not on file  . Number of children: Not on file  . Years of education: Not on file  . Highest education level: Not on file  Occupational History  . Not on file  Social Needs  . Financial resource strain: Not on file  .  Food insecurity    Worry: Not on file    Inability: Not on file  . Transportation needs    Medical: Not on file    Non-medical: Not on file  Tobacco Use  . Smoking status: Former Smoker    Quit date: 1990    Years since quitting: 30.5  . Smokeless tobacco: Never Used  Substance and Sexual Activity  . Alcohol use: Yes    Alcohol/week: 7.0 standard drinks    Types: 7 Cans of beer per week    Comment: social  . Drug use: Never  . Sexual activity: Not on file  Lifestyle  . Physical activity    Days per week: Not on file    Minutes per session:  Not on file  . Stress: Not on file  Relationships  . Social Herbalist on phone: Not on file    Gets together: Not on file    Attends religious service: Not on file    Active member of club or organization: Not on file    Attends meetings of clubs or organizations: Not on file    Relationship status: Not on file  . Intimate partner violence    Fear of current or ex partner: Not on file    Emotionally abused: Not on file    Physically abused: Not on file    Forced sexual activity: Not on file  Other Topics Concern  . Not on file  Social History Narrative  . Not on file      Review of Systems  Constitutional: Negative for chills, fatigue and unexpected weight change.  HENT: Negative for congestion, rhinorrhea, sneezing and sore throat.   Respiratory: Negative for cough, chest tightness, shortness of breath and wheezing.   Cardiovascular: Negative for chest pain and palpitations.       Blood pressure moderately elevated today.  Gastrointestinal: Negative for abdominal pain, constipation, diarrhea, nausea and vomiting.  Endocrine: Negative for cold intolerance, heat intolerance, polydipsia and polyuria.       Blood sugars doing well   Musculoskeletal: Negative for arthralgias, back pain, joint swelling and neck pain.  Skin: Negative for rash.  Allergic/Immunologic: Negative for environmental allergies.  Neurological: Negative for dizziness, tremors, numbness and headaches.  Hematological: Negative for adenopathy. Does not bruise/bleed easily.  Psychiatric/Behavioral: Negative for behavioral problems, sleep disturbance and suicidal ideas. The patient is not nervous/anxious.     Today's Vitals   07/10/18 1041  BP: (!) 168/98  Pulse: 70  Resp: 16  Temp: 97.9 F (36.6 C)  SpO2: 94%  Weight: 205 lb (93 kg)  Height: 5' 7"  (1.702 m)   Body mass index is 32.11 kg/m.  Physical Exam Vitals signs and nursing note reviewed.  Constitutional:      General: He is not  in acute distress.    Appearance: Normal appearance. He is well-developed. He is not diaphoretic.  HENT:     Head: Normocephalic and atraumatic.     Mouth/Throat:     Pharynx: No oropharyngeal exudate.  Eyes:     Pupils: Pupils are equal, round, and reactive to light.  Neck:     Musculoskeletal: Normal range of motion and neck supple.     Thyroid: No thyromegaly.     Vascular: No carotid bruit or JVD.     Trachea: No tracheal deviation.  Cardiovascular:     Rate and Rhythm: Normal rate and regular rhythm.     Heart sounds: Normal heart sounds. No murmur.  No friction rub. No gallop.   Pulmonary:     Effort: Pulmonary effort is normal. No respiratory distress.     Breath sounds: Normal breath sounds. No wheezing or rales.  Chest:     Chest wall: No tenderness.  Abdominal:     Palpations: Abdomen is soft.     Tenderness: There is no abdominal tenderness. There is no guarding.  Musculoskeletal: Normal range of motion.  Lymphadenopathy:     Cervical: No cervical adenopathy.  Skin:    General: Skin is warm and dry.  Neurological:     Mental Status: He is alert and oriented to person, place, and time.     Cranial Nerves: No cranial nerve deficit.  Psychiatric:        Behavior: Behavior normal.        Thought Content: Thought content normal.        Judgment: Judgment normal.    Assessment/Plan: 1. Uncontrolled type 2 diabetes mellitus with hyperglycemia (HCC) - POCT HgB A1C 6.9 today. Continue diabetic medication as prescribed. Refer for diabetic eye exam . - Ambulatory referral to Ophthalmology  2. Essential hypertension Increase HCTZ to 5m daily. Continue other blood pressure medications as prescribed.  - hydrochlorothiazide (MICROZIDE) 12.5 MG capsule; Take 2 capsules (25 mg total) by mouth daily.  Dispense: 180 capsule; Refill: 1  3. Mixed hyperlipidemia Continue atorvastatin as prescribed   General Counseling: Jestefan pattisonunderstanding of the findings of todays  visit and agrees with plan of treatment. I have discussed any further diagnostic evaluation that may be needed or ordered today. We also reviewed his medications today. he has been encouraged to call the office with any questions or concerns that should arise related to todays visit.  Hypertension Counseling:   The following hypertensive lifestyle modification were recommended and discussed:  1. Limiting alcohol intake to less than 1 oz/day of ethanol:(24 oz of beer or 8 oz of wine or 2 oz of 100-proof whiskey). 2. Take baby ASA 81 mg daily. 3. Importance of regular aerobic exercise and losing weight. 4. Reduce dietary saturated fat and cholesterol intake for overall cardiovascular health. 5. Maintaining adequate dietary potassium, calcium, and magnesium intake. 6. Regular monitoring of the blood pressure. 7. Reduce sodium intake to less than 100 mmol/day (less than 2.3 gm of sodium or less than 6 gm of sodium choride)   This patient [place] F[place]vera Guiseas a part of collaborative care agreement  Orders Placed This Encounter  Procedures  . Ambulatory referral to Ophthalmology  . POCT HgB A1C    Meds ordered this encounter  Medications  . hydrochlorothiazide (MICROZIDE) 12.5 MG capsule    Sig: Take 2 capsules (25 mg total) by mouth daily.    Dispense:  180 capsule    Refill:  1    Please note change in dosing.    Order Specific Question:   Supervising Provider    Answer:   KLavera Guise[[[id number]   Time spent: 252Minutes      Dr FLavera GuiseInternal medicine

## 2018-07-10 NOTE — Telephone Encounter (Signed)
-----   Message from Lavera Guise, MD sent at 07/09/2018  1:48 PM EDT ----- Did we take care of this  ----- Message ----- From: Laurie Panda Sent: 07/06/2018   3:38 PM EDT To: Ronnell [place], NP, Lavera Guise, MD, #  Patient had appt a few weeks ago for med refills and he is now out of all meds and they are important meds per pt and he has 0 left and pharmacy told him we didn't send in refills and he needs those by this weekend before he runs out. [facility] [place]

## 2018-08-24 ENCOUNTER — Ambulatory Visit: Payer: BC Managed Care – PPO | Admitting: Nurse Practitioner

## 2018-09-10 ENCOUNTER — Other Ambulatory Visit: Payer: Self-pay

## 2018-09-10 MED ORDER — LOSARTAN POTASSIUM 50 MG PO TABS: ORAL_TABLET | ORAL | 1 refills | Status: DC

## 2018-10-02 ENCOUNTER — Other Ambulatory Visit: Payer: Self-pay

## 2018-10-02 MED ORDER — ATORVASTATIN CALCIUM 40 MG PO TABS: 40.0000 mg | ORAL_TABLET | Freq: Every day | ORAL | 1 refills | Status: DC

## 2018-10-18 ENCOUNTER — Other Ambulatory Visit: Payer: Self-pay

## 2018-10-18 DIAGNOSIS — I10 Essential (primary) hypertension: Secondary | ICD-10-CM

## 2018-10-18 DIAGNOSIS — E1165 Type 2 diabetes mellitus with hyperglycemia: Secondary | ICD-10-CM

## 2018-10-18 MED ORDER — AMLODIPINE BESYLATE 10 MG PO TABS: 10.0000 mg | ORAL_TABLET | Freq: Every day | ORAL | 0 refills | Status: DC

## 2018-10-18 MED ORDER — METFORMIN HCL 500 MG PO TABS: 1000.0000 mg | ORAL_TABLET | Freq: Two times a day (BID) | ORAL | 2 refills | Status: DC

## 2018-10-19 ENCOUNTER — Other Ambulatory Visit: Payer: Self-pay

## 2018-10-19 MED ORDER — SITAGLIPTIN PHOSPHATE 100 MG PO TABS: 100.0000 mg | ORAL_TABLET | Freq: Every day | ORAL | 0 refills | Status: DC

## 2018-10-19 NOTE — Telephone Encounter (Signed)
Spoke with pt need appt for further refills  

## 2018-10-22 ENCOUNTER — Encounter: Payer: Self-pay | Admitting: Nurse Practitioner

## 2018-10-22 ENCOUNTER — Ambulatory Visit: Payer: BC Managed Care – PPO | Admitting: Nurse Practitioner

## 2018-10-22 ENCOUNTER — Other Ambulatory Visit: Payer: Self-pay

## 2018-10-22 VITALS — BP 148/92 | HR 77 | Temp 97.3°F | Resp 16 | Ht 67.0 in | Wt 208.0 lb

## 2018-10-22 DIAGNOSIS — I10 Essential (primary) hypertension: Secondary | ICD-10-CM

## 2018-10-22 DIAGNOSIS — E1165 Type 2 diabetes mellitus with hyperglycemia: Secondary | ICD-10-CM | POA: Diagnosis not present

## 2018-10-22 LAB — POCT GLYCOSYLATED HEMOGLOBIN (HGB A1C): Hemoglobin A1C: 7.1 % — AB (ref 4.0–5.6)

## 2018-10-22 MED ORDER — CLONIDINE HCL 0.1 MG PO TABS: 0.1000 mg | ORAL_TABLET | Freq: Two times a day (BID) | ORAL | 1 refills | Status: DC

## 2018-10-22 MED ORDER — AMLODIPINE BESYLATE 10 MG PO TABS: 10.0000 mg | ORAL_TABLET | Freq: Every day | ORAL | 1 refills | Status: DC

## 2018-10-22 MED ORDER — SITAGLIPTIN PHOSPHATE 100 MG PO TABS: 100.0000 mg | ORAL_TABLET | Freq: Every day | ORAL | 1 refills | Status: DC

## 2018-10-22 NOTE — Progress Notes (Signed)
[facility] [place], [place] [address]  Internal MEDICINE  Office Visit Note  Patient Name: Joshua Donovan  [id number]  [id number]  Date of Service: 10/31/2018  Chief Complaint  Patient presents with  . Diabetes  . Hypertension    The patient is here for follow up diabetes and hypertension. Blood sugars are doing well. hgbA1c 7.1 today. Blood pressure moderately elevated. States that blood pressure is always high, especially when he first arrives in the office. Does take all medications as prescribed. He states that he feels well and has no concerns or complaints today.      Current Medication: Outpatient Encounter Medications as of 10/22/2018  Medication Sig  . ACCU-CHEK FASTCLIX LANCETS MISC Use as directed twice daily dia E11.65  . amLODipine (NORVASC) 10 MG tablet Take 1 tablet (10 mg total) by mouth daily.  Marland Kitchen atorvastatin (LIPITOR) 40 MG tablet Take 1 tablet (40 mg total) by mouth daily.  . Blood Glucose Monitoring Suppl (ACCU-CHEK AVIVA PLUS) w/Device KIT Use as directed  . cetirizine (ZYRTEC) 10 MG tablet TAKE 1 TABLET BY MOUTH ONCE DAILY  . cloNIDine (CATAPRES) 0.1 MG tablet Take 1 tablet (0.1 mg total) by mouth 2 (two) times daily.  Marland Kitchen glucose blood (ACCU-CHEK AVIVA PLUS) test strip Use two times daily to check blood sugar  . hydrochlorothiazide (MICROZIDE) 12.5 MG capsule Take 2 capsules (25 mg total) by mouth daily.  Marland Kitchen losartan (COZAAR) 50 MG tablet Take one tablet by mouth daily  . metoprolol tartrate (LOPRESSOR) 25 MG tablet Take 1 tablet (25 mg total) by mouth daily.  . pneumococcal 23 valent vaccine (PNEUMOVAX 23) 25 MCG/0.5ML injection Inject 0.5 mLs into the muscle tomorrow at 10 am.  . sitaGLIPtin (JANUVIA) 100 MG tablet Take 1 tablet (100 mg total) by mouth daily.  . tadalafil (CIALIS) 5 MG tablet Take 5 mg by mouth daily.  . XYOSTED 75 MG/0.5ML SOAJ ADMINISTER 0.5 ML INTRAMUSCULAR EVERY WEEK  . [DISCONTINUED] amLODipine (NORVASC) 10 MG  tablet Take 1 tablet (10 mg total) by mouth daily.  . [DISCONTINUED] cloNIDine (CATAPRES) 0.1 MG tablet TAKE 1 TABLET BY MOUTH TWICE DAILY  . [DISCONTINUED] metFORMIN (GLUCOPHAGE) 500 MG tablet Take 2 tablets (1,000 mg total) by mouth 2 (two) times daily.  . [DISCONTINUED] sitaGLIPtin (JANUVIA) 100 MG tablet Take 1 tablet (100 mg total) by mouth daily.   No facility-administered encounter medications on file as of 10/22/2018.     Surgical History: Past Surgical History:  Procedure Laterality Date  . COLONOSCOPY WITH PROPOFOL N/A 09/08/2017   Procedure: COLONOSCOPY WITH PROPOFOL;  Surgeon: Lucilla Lame, MD;  Location: [place];  Service: Endoscopy;  Laterality: N/A;  Diabetic - oral meds  . partial intestine removed     approx date: 1980  . POLYPECTOMY  09/08/2017   Procedure: POLYPECTOMY;  Surgeon: Lucilla Lame, MD;  Location: [place];  Service: Endoscopy;;    Medical History: Past Medical History:  Diagnosis Date  . Diabetes mellitus without complication ([place])   . GERD (gastroesophageal reflux disease)   . 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 High cholesterol   . Hyperlipidemia   . Hypertension     Family History: Family History  Problem Relation Age of Onset  . Hypertension Father   . Diabetes Sister     Social History   Socioeconomic History  . Marital status: Married    Spouse name: Not on file  . Number of children: Not on file  .  Years of education: Not on file  . Highest education level: Not on file  Occupational History  . Not on file  Social Needs  . Financial resource strain: Not on file  . Food insecurity    Worry: Not on file    Inability: Not on file  . Transportation needs    Medical: Not on file    Non-medical: Not on file  Tobacco Use  . Smoking status: Former Smoker    Quit date: 1990    Years since quitting: 30.8  . Smokeless tobacco: Never Used  Substance and Sexual Activity  . Alcohol use: Yes    Alcohol/week:  7.0 standard drinks    Types: 7 Cans of beer per week    Comment: social  . Drug use: Never  . Sexual activity: Not on file  Lifestyle  . Physical activity    Days per week: Not on file    Minutes per session: Not on file  . Stress: Not on file  Relationships  . Social Herbalist on phone: Not on file    Gets together: Not on file    Attends religious service: Not on file    Active member of club or organization: Not on file    Attends meetings of clubs or organizations: Not on file    Relationship status: Not on file  . Intimate partner violence    Fear of current or ex partner: Not on file    Emotionally abused: Not on file    Physically abused: Not on file    Forced sexual activity: Not on file  Other Topics Concern  . Not on file  Social History Narrative  . Not on file      Review of Systems  Constitutional: Negative for chills, fatigue and unexpected weight change.  HENT: Negative for congestion, rhinorrhea, sneezing and sore throat.   Respiratory: Negative for cough, chest tightness, shortness of breath and wheezing.   Cardiovascular: Negative for chest pain and palpitations.  Gastrointestinal: Negative for abdominal pain, constipation, diarrhea, nausea and vomiting.  Endocrine: Negative for cold intolerance, heat intolerance, polydipsia and polyuria.       Blood sugars doing well   Musculoskeletal: Negative for arthralgias, back pain, joint swelling and neck pain.  Skin: Negative for rash.  Allergic/Immunologic: Negative for environmental allergies.  Neurological: Negative for dizziness, tremors, numbness and headaches.  Hematological: Negative for adenopathy. Does not bruise/bleed easily.  Psychiatric/Behavioral: Negative for behavioral problems, sleep disturbance and suicidal ideas. The patient is not nervous/anxious.     Today's Vitals   10/22/18 1113  BP: (!) 148/92  Pulse: 77  Resp: 16  Temp: (!) 97.3 F (36.3 C)  SpO2: 98%  Weight: 208 lb  (94.3 kg)  Height: '5\' 7"'$  (1.702 m)   Body mass index is 32.58 kg/m.  Physical Exam Vitals signs and nursing note reviewed.  Constitutional:      General: He is not in acute distress.    Appearance: Normal appearance. He is well-developed. He is not diaphoretic.  HENT:     Head: Normocephalic and atraumatic.     Mouth/Throat:     Pharynx: No oropharyngeal exudate.  Eyes:     Pupils: Pupils are equal, round, and reactive to light.  Neck:     Musculoskeletal: Normal range of motion and neck supple.     Thyroid: No thyromegaly.     Vascular: No carotid bruit or JVD.     Trachea: No tracheal deviation.  Cardiovascular:     Rate and Rhythm: Normal rate and regular rhythm.     Heart sounds: Normal heart sounds. No murmur. No friction rub. No gallop.   Pulmonary:     Effort: Pulmonary effort is normal. No respiratory distress.     Breath sounds: Normal breath sounds. No wheezing or rales.  Chest:     Chest wall: No tenderness.  Abdominal:     Palpations: Abdomen is soft.     Tenderness: There is no abdominal tenderness. There is no guarding.  Musculoskeletal: Normal range of motion.  Lymphadenopathy:     Cervical: No cervical adenopathy.  Skin:    General: Skin is warm and dry.  Neurological:     Mental Status: He is alert and oriented to person, place, and time.     Cranial Nerves: No cranial nerve deficit.  Psychiatric:        Behavior: Behavior normal.        Thought Content: Thought content normal.        Judgment: Judgment normal.   Assessment/Plan: 1. Type 2 diabetes mellitus with hyperglycemia, without long-term current use of insulin (HCC) - POCT HgB A1C 7.1 today. Continue diabetic medication as prescribed.  - sitaGLIPtin (JANUVIA) 100 MG tablet; Take 1 tablet (100 mg total) by mouth daily.  Dispense: 90 tablet; Refill: 1  2. Hypertension, essential Generally stable. Continue BP medication as prescribed. Refills provided today.  - cloNIDine (CATAPRES) 0.1 MG  tablet; Take 1 tablet (0.1 mg total) by mouth 2 (two) times daily.  Dispense: 180 tablet; Refill: 1 - amLODipine (NORVASC) 10 MG tablet; Take 1 tablet (10 mg total) by mouth daily.  Dispense: 90 tablet; Refill: 1   General Counseling: sergey ishler understanding of the findings of todays visit and agrees with plan of treatment. I have discussed any further diagnostic evaluation that may be needed or ordered today. We also reviewed his medications today. he has been encouraged to call the office with any questions or concerns that should arise related to todays visit.    Orders Placed This Encounter  Procedures  . POCT HgB A1C    Meds ordered this encounter  Medications  . cloNIDine (CATAPRES) 0.1 MG tablet    Sig: Take 1 tablet (0.1 mg total) by mouth 2 (two) times daily.    Dispense:  180 tablet    Refill:  1    Please consider 90 day supplies to promote better adherence    Order Specific Question:   Supervising Provider    Answer:   Lavera Guise [place]  . sitaGLIPtin (JANUVIA) 100 MG tablet    Sig: Take 1 tablet (100 mg total) by mouth daily.    Dispense:  90 tablet    Refill:  1    Pt need appt for further refills    Order Specific Question:   Supervising Provider    Answer:   Lavera Guise [place]  . amLODipine (NORVASC) 10 MG tablet    Sig: Take 1 tablet (10 mg total) by mouth daily.    Dispense:  90 tablet    Refill:  1    Order Specific Question:   Supervising Provider    Answer:   Lavera Guise [[id number]]    Time spent: 50 Minutes      Dr Lavera Guise Internal medicine

## 2018-10-23 ENCOUNTER — Other Ambulatory Visit: Payer: Self-pay

## 2018-10-23 DIAGNOSIS — E1165 Type 2 diabetes mellitus with hyperglycemia: Secondary | ICD-10-CM

## 2018-10-23 MED ORDER — METFORMIN HCL 500 MG PO TABS: 1000.0000 mg | ORAL_TABLET | Freq: Two times a day (BID) | ORAL | 2 refills | Status: DC

## 2018-10-31 DIAGNOSIS — E1165 Type 2 diabetes mellitus with hyperglycemia: Secondary | ICD-10-CM | POA: Insufficient documentation

## 2018-10-31 DIAGNOSIS — N529 Male erectile dysfunction, unspecified: Secondary | ICD-10-CM | POA: Insufficient documentation

## 2019-01-17 ENCOUNTER — Telehealth: Payer: Self-pay

## 2019-01-17 NOTE — Telephone Encounter (Signed)
CONFIRMED AND SCREENED FOR 01-22-19 OV. °

## 2019-01-22 ENCOUNTER — Ambulatory Visit (INDEPENDENT_AMBULATORY_CARE_PROVIDER_SITE_OTHER): Payer: BC Managed Care – PPO | Admitting: Nurse Practitioner

## 2019-01-22 ENCOUNTER — Encounter: Payer: Self-pay | Admitting: Nurse Practitioner

## 2019-01-22 ENCOUNTER — Other Ambulatory Visit: Payer: Self-pay

## 2019-01-22 VITALS — BP 169/98 | HR 75 | Temp 98.0°F | Resp 16 | Ht 67.0 in | Wt 206.0 lb

## 2019-01-22 DIAGNOSIS — I10 Essential (primary) hypertension: Secondary | ICD-10-CM

## 2019-01-22 DIAGNOSIS — E1165 Type 2 diabetes mellitus with hyperglycemia: Secondary | ICD-10-CM

## 2019-01-22 DIAGNOSIS — R300 Dysuria: Secondary | ICD-10-CM

## 2019-01-22 DIAGNOSIS — Z0001 Encounter for general adult medical examination with abnormal findings: Secondary | ICD-10-CM

## 2019-01-22 DIAGNOSIS — N529 Male erectile dysfunction, unspecified: Secondary | ICD-10-CM | POA: Diagnosis not present

## 2019-01-22 LAB — POCT GLYCOSYLATED HEMOGLOBIN (HGB A1C): Hemoglobin A1C: 7 % — AB (ref 4.0–5.6)

## 2019-01-22 MED ORDER — LOSARTAN POTASSIUM 100 MG PO TABS: ORAL_TABLET | ORAL | 1 refills | Status: DC

## 2019-01-22 NOTE — Progress Notes (Signed)
[facility] [place], [place] [address]  Internal MEDICINE  Office Visit Note  Patient Name: Joshua Donovan  [id number]  [id number]  Date of Service: 01/23/2019   Pt is here for routine health maintenance examination   Chief Complaint  Patient presents with  . Annual Exam  . Diabetes  . Hypertension     The patient is here for health maintenance exam. Blood pressure is moderately elevated today. Continues to take all medications as prescribed. Has no new concerns or complaints. Denies chest pain, chest pressure, or shortness of breath. Denies headaches or visual disturbances. His blood sugars are well managed. HgbA1c is 7.0 today. He is due to have routine, fasting blood work done.   Current Medication: Outpatient Encounter Medications as of 01/22/2019  Medication Sig  . ACCU-CHEK FASTCLIX LANCETS MISC Use as directed twice daily dia E11.65  . amLODipine (NORVASC) 10 MG tablet Take 1 tablet (10 mg total) by mouth daily.  Marland Kitchen atorvastatin (LIPITOR) 40 MG tablet Take 1 tablet (40 mg total) by mouth daily.  . Blood Glucose Monitoring Suppl (ACCU-CHEK AVIVA PLUS) w/Device KIT Use as directed  . cetirizine (ZYRTEC) 10 MG tablet TAKE 1 TABLET BY MOUTH ONCE DAILY  . cloNIDine (CATAPRES) 0.1 MG tablet Take 1 tablet (0.1 mg total) by mouth 2 (two) times daily.  Marland Kitchen glucose blood (ACCU-CHEK AVIVA PLUS) test strip Use two times daily to check blood sugar  . hydrochlorothiazide (MICROZIDE) 12.5 MG capsule Take 2 capsules (25 mg total) by mouth daily.  Marland Kitchen losartan (COZAAR) 100 MG tablet Take one tablet by mouth daily  . metFORMIN (GLUCOPHAGE) 500 MG tablet Take 2 tablets (1,000 mg total) by mouth 2 (two) times daily.  . metoprolol tartrate (LOPRESSOR) 25 MG tablet Take 1 tablet (25 mg total) by mouth daily.  . pneumococcal 23 valent vaccine (PNEUMOVAX 23) 25 MCG/0.5ML injection Inject 0.5 mLs into the muscle tomorrow at 10 am.  . sitaGLIPtin (JANUVIA) 100 MG tablet Take 1  tablet (100 mg total) by mouth daily.  . tadalafil (CIALIS) 5 MG tablet Take 5 mg by mouth daily.  . XYOSTED 75 MG/0.5ML SOAJ ADMINISTER 0.5 ML INTRAMUSCULAR EVERY WEEK  . [DISCONTINUED] losartan (COZAAR) 50 MG tablet Take one tablet by mouth daily   No facility-administered encounter medications on file as of 01/22/2019.    Surgical History: Past Surgical History:  Procedure Laterality Date  . COLONOSCOPY WITH PROPOFOL N/A 09/08/2017   Procedure: COLONOSCOPY WITH PROPOFOL;  Surgeon: Lucilla Lame, MD;  Location: [facility];  Service: Endoscopy;  Laterality: N/A;  Diabetic - oral meds  . partial intestine removed     approx date: 1980  . POLYPECTOMY  09/08/2017   Procedure: POLYPECTOMY;  Surgeon: Lucilla Lame, MD;  Location[place];  Service: Endoscopy;;    Medical History: Past Medical History:  Diagnosis Date  . Diabetes mellitus without complication ([place])   . GERD (gastroesophageal reflux disease)   . 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 High cholesterol   . Hyperlipidemia   . Hypertension     Family History: Family History  Problem Relation Age of Onset  . Hypertension Father   . Diabetes Sister       Review of Systems  Constitutional: Negative for chills, fatigue and unexpected weight change.  HENT: Negative for congestion, rhinorrhea, sneezing and sore throat.   Respiratory: Negative for cough, chest tightness, shortness of breath and wheezing.   Cardiovascular: Negative for chest pain and palpitations.  Elevated blood pressure.   Gastrointestinal: Negative for abdominal pain, constipation, diarrhea, nausea and vomiting.  Endocrine: Negative for cold intolerance, heat intolerance, polydipsia and polyuria.       Blood sugars stable.   Genitourinary: Negative for dysuria, penile pain and urgency.  Musculoskeletal: Negative for arthralgias, back pain, joint swelling and neck pain.  Skin: Negative for rash.  Allergic/Immunologic:  Negative for environmental allergies.  Neurological: Negative for dizziness, tremors, numbness and headaches.  Hematological: Negative for adenopathy. Does not bruise/bleed easily.  Psychiatric/Behavioral: Negative for behavioral problems, sleep disturbance and suicidal ideas. The patient is not nervous/anxious.     Today's Vitals   01/22/19 1133  BP: (!) 169/98  Pulse: 75  Resp: 16  Temp: 98 F (36.7 C)  SpO2: 96%  Weight: 206 lb (93.4 kg)  Height: 5' 7"  (1.702 m)   Body mass index is 32.26 kg/m.   Physical Exam Vitals and nursing note reviewed.  Constitutional:      General: He is not in acute distress.    Appearance: Normal appearance. He is well-developed. He is not diaphoretic.  HENT:     Head: Normocephalic and atraumatic.     Nose: Nose normal.     Mouth/Throat:     Pharynx: No oropharyngeal exudate.  Eyes:     Pupils: Pupils are equal, round, and reactive to light.  Neck:     Thyroid: No thyromegaly.     Vascular: No carotid bruit or JVD.     Trachea: No tracheal deviation.  Cardiovascular:     Rate and Rhythm: Normal rate and regular rhythm.     Pulses: Normal pulses.          Posterior tibial pulses are 2+ on the right side and 2+ on the left side.     Heart sounds: Normal heart sounds. No murmur. No friction rub. No gallop.   Pulmonary:     Effort: Pulmonary effort is normal. No respiratory distress.     Breath sounds: Normal breath sounds. No wheezing or rales.  Chest:     Chest wall: No tenderness.  Abdominal:     General: Bowel sounds are normal.     Palpations: Abdomen is soft.     Tenderness: There is no abdominal tenderness. There is no guarding.  Musculoskeletal:        General: Normal range of motion.     Cervical back: Normal range of motion and neck supple.     Right foot: Normal range of motion. No deformity.     Left foot: Normal range of motion. No deformity.  Feet:     Right foot:     Protective Sensation: 10 sites tested. 10 sites  sensed.     Skin integrity: Dry skin present.     Toenail Condition: Right toenails are normal.     Left foot:     Protective Sensation: 10 sites tested. 10 sites sensed.     Skin integrity: Dry skin present.     Toenail Condition: Left toenails are normal.  Lymphadenopathy:     Cervical: No cervical adenopathy.  Skin:    General: Skin is warm and dry.  Neurological:     Mental Status: He is alert and oriented to person, place, and time.     Cranial Nerves: No cranial nerve deficit.  Psychiatric:        Mood and Affect: Mood normal.        Behavior: Behavior normal.  Thought Content: Thought content normal.        Judgment: Judgment normal.      LABS: Recent Results (from the past 2160 hour(s))  UA/M w/rflx Culture, Routine     Status: None   Collection Time: 01/22/19 12:00 AM   Specimen: Urine   URINE  Result Value Ref Range   Specific Gravity, UA 1.016 1.005 - 1.030   pH, UA 5.0 5.0 - 7.5   Color, UA Yellow Yellow   Appearance Ur Clear Clear   Leukocytes,UA Negative Negative   Protein,UA Negative Negative/Trace   Glucose, UA Negative Negative   Ketones, UA Negative Negative   RBC, UA Negative Negative   Bilirubin, UA Negative Negative   Urobilinogen, Ur 0.2 0.2 - 1.0 mg/dL   Nitrite, UA Negative Negative   Microscopic Examination Comment     Comment: Microscopic follows if indicated.   Microscopic Examination See below:     Comment: Microscopic was indicated and was performed.   Urinalysis Reflex Comment     Comment: This specimen will not reflex to a Urine Culture.  Microscopic Examination     Status: None   Collection Time: 01/22/19 12:00 AM   URINE  Result Value Ref Range   WBC, UA 0-5 0 - 5 /hpf   RBC 0-2 0 - 2 /hpf   Epithelial Cells (non renal) None seen 0 - 10 /hpf   Casts None seen None seen /lpf   Mucus, UA Present Not Estab.   Bacteria, UA None seen None seen/Few  POCT HgB A1C     Status: Abnormal   Collection Time: 01/22/19 12:13 PM   Result Value Ref Range   Hemoglobin A1C 7.0 (A) 4.0 - 5.6 %   HbA1c POC (<> result, manual entry)     HbA1c, POC (prediabetic range)     HbA1c, POC (controlled diabetic range)      Assessment/Plan: 1. Encounter for general adult medical examination with abnormal findings Annual health maintenance exam today. Order form written to have routine, fasting labs checked.   2. Type 2 diabetes mellitus with hyperglycemia, without long-term current use of insulin (HCC) - POCT HgB A1C 7.0 today. Continue diabetic medication as prescribed   3. Essential hypertension Increase losartan to 167m daily. Continue other BP medications as prescribed.  - losartan (COZAAR) 100 MG tablet; Take one tablet by mouth daily  Dispense: 90 tablet; Refill: 1  4. Erectile dysfunction, unspecified erectile dysfunction type Check testosterone level with labs.   5. Dysuria - UA/M w/rflx Culture, Routine  General Counseling: Jcalub tarnowunderstanding of the findings of todays visit and agrees with plan of treatment. I have discussed any further diagnostic evaluation that may be needed or ordered today. We also reviewed his medications today. he has been encouraged to call the office with any questions or concerns that should arise related to todays visit.    Counseling:  Hypertension Counseling:   The following hypertensive lifestyle modification were recommended and discussed:  1. Limiting alcohol intake to less than 1 oz/day of ethanol:(24 oz of beer or 8 oz of wine or 2 oz of 100-proof whiskey). 2. Take baby ASA 81 mg daily. 3. Importance of regular aerobic exercise and losing weight. 4. Reduce dietary saturated fat and cholesterol intake for overall cardiovascular health. 5. Maintaining adequate dietary potassium, calcium, and magnesium intake. 6. Regular monitoring of the blood pressure. 7. Reduce sodium intake to less than 100 mmol/day (less than 2.3 gm of sodium or less than 6 gm  of sodium choride)    This patient was seen by [place] with Dr Lavera Guise as a part of collaborative care agreement  Orders Placed This Encounter  Procedures  . Microscopic Examination  . UA/M w/rflx Culture, Routine  . POCT HgB A1C    Meds ordered this encounter  Medications  . losartan (COZAAR) 100 MG tablet    Sig: Take one tablet by mouth daily    Dispense:  90 tablet    Refill:  1    Order Specific Question:   Supervising Provider    Answer:   Lavera Guise [[id number]]    Total time spent: 22 Minutes  Time spent includes review of chart, medications, test results, and follow up plan with the patient.     Lavera Guise, MD  Internal Medicine

## 2019-01-23 DIAGNOSIS — Z0001 Encounter for general adult medical examination with abnormal findings: Secondary | ICD-10-CM | POA: Insufficient documentation

## 2019-01-23 DIAGNOSIS — R300 Dysuria: Secondary | ICD-10-CM | POA: Insufficient documentation

## 2019-01-23 LAB — UA/M W/RFLX CULTURE, ROUTINE
Bilirubin, UA: NEGATIVE
Glucose, UA: NEGATIVE
Ketones, UA: NEGATIVE
Leukocytes,UA: NEGATIVE
Nitrite, UA: NEGATIVE
Protein,UA: NEGATIVE
RBC, UA: NEGATIVE
Specific Gravity, UA: 1.016 (ref 1.005–1.030)
Urobilinogen, Ur: 0.2 mg/dL (ref 0.2–1.0)
pH, UA: 5 (ref 5.0–7.5)

## 2019-01-23 LAB — MICROSCOPIC EXAMINATION
Bacteria, UA: NONE SEEN
Casts: NONE SEEN /lpf
Epithelial Cells (non renal): NONE SEEN /hpf (ref 0–10)

## 2019-02-04 ENCOUNTER — Other Ambulatory Visit: Payer: Self-pay

## 2019-02-04 DIAGNOSIS — I10 Essential (primary) hypertension: Secondary | ICD-10-CM

## 2019-02-04 MED ORDER — HYDROCHLOROTHIAZIDE 12.5 MG PO CAPS: 25.0000 mg | ORAL_CAPSULE | Freq: Every day | ORAL | 1 refills | Status: DC

## 2019-02-19 ENCOUNTER — Ambulatory Visit: Payer: BC Managed Care – PPO | Admitting: Nurse Practitioner

## 2019-02-20 ENCOUNTER — Telehealth: Payer: Self-pay

## 2019-02-20 NOTE — Telephone Encounter (Signed)
BILLED PATIENT MISSED APPOINTMENT FEE 02/19/2019.

## 2019-08-09 ENCOUNTER — Other Ambulatory Visit: Payer: Self-pay

## 2019-08-09 DIAGNOSIS — I10 Essential (primary) hypertension: Secondary | ICD-10-CM

## 2019-08-09 DIAGNOSIS — E1165 Type 2 diabetes mellitus with hyperglycemia: Secondary | ICD-10-CM

## 2019-08-09 MED ORDER — CLONIDINE HCL 0.1 MG PO TABS: 0.1000 mg | ORAL_TABLET | Freq: Two times a day (BID) | ORAL | 1 refills | Status: DC

## 2019-08-09 MED ORDER — AMLODIPINE BESYLATE 10 MG PO TABS: 10.0000 mg | ORAL_TABLET | Freq: Every day | ORAL | 1 refills | Status: DC

## 2019-08-09 MED ORDER — LOSARTAN POTASSIUM 100 MG PO TABS: ORAL_TABLET | ORAL | 1 refills | Status: DC

## 2019-08-09 MED ORDER — METFORMIN HCL 500 MG PO TABS: 1000.0000 mg | ORAL_TABLET | Freq: Two times a day (BID) | ORAL | 1 refills | Status: DC

## 2019-10-21 ENCOUNTER — Other Ambulatory Visit: Payer: Self-pay

## 2019-10-21 DIAGNOSIS — E1165 Type 2 diabetes mellitus with hyperglycemia: Secondary | ICD-10-CM

## 2019-10-21 MED ORDER — METFORMIN HCL 500 MG PO TABS: 1000.0000 mg | ORAL_TABLET | Freq: Two times a day (BID) | ORAL | 0 refills | Status: DC

## 2019-11-25 ENCOUNTER — Encounter: Payer: BC Managed Care – PPO | Admitting: Nurse Practitioner

## 2020-02-20 ENCOUNTER — Ambulatory Visit (INDEPENDENT_AMBULATORY_CARE_PROVIDER_SITE_OTHER): Payer: Medicare PPO | Admitting: Physician Assistant

## 2020-02-20 ENCOUNTER — Encounter: Payer: Self-pay | Admitting: Physician Assistant

## 2020-02-20 ENCOUNTER — Other Ambulatory Visit: Payer: Self-pay

## 2020-02-20 DIAGNOSIS — Z9114 Patient's other noncompliance with medication regimen: Secondary | ICD-10-CM | POA: Diagnosis not present

## 2020-02-20 DIAGNOSIS — I10 Essential (primary) hypertension: Secondary | ICD-10-CM

## 2020-02-20 DIAGNOSIS — E782 Mixed hyperlipidemia: Secondary | ICD-10-CM | POA: Diagnosis not present

## 2020-02-20 DIAGNOSIS — N529 Male erectile dysfunction, unspecified: Secondary | ICD-10-CM

## 2020-02-20 DIAGNOSIS — E1165 Type 2 diabetes mellitus with hyperglycemia: Secondary | ICD-10-CM

## 2020-02-20 DIAGNOSIS — R5383 Other fatigue: Secondary | ICD-10-CM

## 2020-02-20 LAB — POCT GLYCOSYLATED HEMOGLOBIN (HGB A1C): Hemoglobin A1C: 10.8 % — AB (ref 4.0–5.6)

## 2020-02-20 MED ORDER — METFORMIN HCL 500 MG PO TABS: 1000.0000 mg | ORAL_TABLET | Freq: Two times a day (BID) | ORAL | 0 refills | Status: DC

## 2020-02-20 MED ORDER — CETIRIZINE HCL 10 MG PO TABS: 10.0000 mg | ORAL_TABLET | Freq: Every day | ORAL | 1 refills | Status: DC

## 2020-02-20 MED ORDER — HYDROCHLOROTHIAZIDE 12.5 MG PO CAPS: 25.0000 mg | ORAL_CAPSULE | Freq: Every day | ORAL | 1 refills | Status: DC

## 2020-02-20 MED ORDER — ATORVASTATIN CALCIUM 40 MG PO TABS: 40.0000 mg | ORAL_TABLET | Freq: Every day | ORAL | 0 refills | Status: DC

## 2020-02-20 MED ORDER — SITAGLIPTIN PHOSPHATE 100 MG PO TABS: 100.0000 mg | ORAL_TABLET | Freq: Every day | ORAL | 0 refills | Status: DC

## 2020-02-20 MED ORDER — AMLODIPINE BESYLATE 10 MG PO TABS: 10.0000 mg | ORAL_TABLET | Freq: Every day | ORAL | 1 refills | Status: DC

## 2020-02-20 MED ORDER — LOSARTAN POTASSIUM 100 MG PO TABS: ORAL_TABLET | ORAL | 0 refills | Status: DC

## 2020-02-20 MED ORDER — TADALAFIL 5 MG PO TABS: 5.0000 mg | ORAL_TABLET | Freq: Every day | ORAL | 0 refills | Status: DC

## 2020-02-20 MED ORDER — METOPROLOL TARTRATE 25 MG PO TABS: 25.0000 mg | ORAL_TABLET | Freq: Every day | ORAL | 0 refills | Status: DC

## 2020-02-20 MED ORDER — CLONIDINE HCL 0.1 MG PO TABS
0.1000 mg | ORAL_TABLET | Freq: Two times a day (BID) | ORAL | 1 refills | Status: DC
Start: 2020-02-20 — End: 2021-08-03

## 2020-02-20 NOTE — Progress Notes (Signed)
[facility] [place], [place] [address]  Internal MEDICINE  Office Visit Note  Patient Name: Joshua Donovan  [id number]  [id number]  Date of Service: 02/23/2020  Chief Complaint  Patient presents with  . Medication Refill  . Follow-up    HPI Pt arrives today for f/u and medication refill. He has no complaints today. Pt has not been here in over a year and is unclear on exactly which medications he has been taking recently since he has run out of many. He does report his BP stays high.  States systolic 539 before medication, but unsure what it is after. Recheck in office 154/96. He does not check blood glucose at home.   Current Medication: Outpatient Encounter Medications as of 02/20/2020  Medication Sig  . ACCU-CHEK FASTCLIX LANCETS MISC Use as directed twice daily dia E11.65  . Blood Glucose Monitoring Suppl (ACCU-CHEK AVIVA PLUS) w/Device KIT Use as directed  . glucose blood (ACCU-CHEK AVIVA PLUS) test strip Use two times daily to check blood sugar  . pneumococcal 23 valent vaccine (PNEUMOVAX-23) 25 MCG/0.5ML injection Inject 0.5 mLs into the muscle tomorrow at 10 am.  . [DISCONTINUED] amLODipine (NORVASC) 10 MG tablet Take 1 tablet (10 mg total) by mouth daily.  . [DISCONTINUED] atorvastatin (LIPITOR) 40 MG tablet Take 1 tablet (40 mg total) by mouth daily.  . [DISCONTINUED] cetirizine (ZYRTEC) 10 MG tablet TAKE 1 TABLET BY MOUTH ONCE DAILY  . [DISCONTINUED] cloNIDine (CATAPRES) 0.1 MG tablet Take 1 tablet (0.1 mg total) by mouth 2 (two) times daily.  . [DISCONTINUED] hydrochlorothiazide (MICROZIDE) 12.5 MG capsule Take 2 capsules (25 mg total) by mouth daily.  . [DISCONTINUED] losartan (COZAAR) 100 MG tablet Take one tablet by mouth daily  . [DISCONTINUED] metFORMIN (GLUCOPHAGE) 500 MG tablet Take 2 tablets (1,000 mg total) by mouth 2 (two) times daily.  . [DISCONTINUED] metoprolol tartrate (LOPRESSOR) 25 MG tablet Take 1 tablet (25 mg total) by mouth daily.   . [DISCONTINUED] sitaGLIPtin (JANUVIA) 100 MG tablet Take 1 tablet (100 mg total) by mouth daily.  . [DISCONTINUED] tadalafil (CIALIS) 5 MG tablet Take 5 mg by mouth daily.  . [DISCONTINUED] XYOSTED 75 MG/0.5ML SOAJ ADMINISTER 0.5 ML INTRAMUSCULAR EVERY WEEK  . amLODipine (NORVASC) 10 MG tablet Take 1 tablet (10 mg total) by mouth daily.  Marland Kitchen atorvastatin (LIPITOR) 40 MG tablet Take 1 tablet (40 mg total) by mouth daily.  . cetirizine (ZYRTEC) 10 MG tablet Take 1 tablet (10 mg total) by mouth daily.  . cloNIDine (CATAPRES) 0.1 MG tablet Take 1 tablet (0.1 mg total) by mouth 2 (two) times daily.  . hydrochlorothiazide (MICROZIDE) 12.5 MG capsule Take 2 capsules (25 mg total) by mouth daily.  Marland Kitchen losartan (COZAAR) 100 MG tablet Take one tablet by mouth daily  . metFORMIN (GLUCOPHAGE) 500 MG tablet Take 2 tablets (1,000 mg total) by mouth 2 (two) times daily.  . metoprolol tartrate (LOPRESSOR) 25 MG tablet Take 1 tablet (25 mg total) by mouth daily.  . sitaGLIPtin (JANUVIA) 100 MG tablet Take 1 tablet (100 mg total) by mouth daily.  . tadalafil (CIALIS) 5 MG tablet Take 1 tablet (5 mg total) by mouth daily.   No facility-administered encounter medications on file as of 02/20/2020.    Surgical History: Past Surgical History:  Procedure Laterality Date  . COLONOSCOPY WITH PROPOFOL N/A 09/08/2017   Procedure: COLONOSCOPY WITH PROPOFOL;  Surgeon: Lucilla Lame, MD;  Location: [place];  Service: Endoscopy;  Laterality: N/A;  Diabetic -  oral meds  . partial intestine removed     approx date: 1980  . POLYPECTOMY  09/08/2017   Procedure: POLYPECTOMY;  Surgeon: Lucilla Lame, MD;  Location: [place];  Service: Endoscopy;;    Medical History: Past Medical History:  Diagnosis Date  . Diabetes mellitus without complication ([place])   . GERD (gastroesophageal reflux disease)   . 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 High cholesterol   . Hyperlipidemia   . Hypertension      Family History: Family History  Problem Relation Age of Onset  . Hypertension Father   . Diabetes Sister     Social History   Socioeconomic History  . Marital status: Married    Spouse name: Not on file  . Number of children: Not on file  . Years of education: Not on file  . Highest education level: Not on file  Occupational History  . Not on file  Tobacco Use  . Smoking status: Former Smoker    Quit date: 1990    Years since quitting: 32.1  . Smokeless tobacco: Never Used  Vaping Use  . Vaping Use: Never used  Substance and Sexual Activity  . Alcohol use: Yes    Alcohol/week: 7.0 standard drinks    Types: 7 Cans of beer per week    Comment: social  . Drug use: Never  . Sexual activity: Not on file  Other Topics Concern  . Not on file  Social History Narrative  . Not on file   Social Determinants of Health   Financial Resource Strain: Not on file  Food Insecurity: Not on file  Transportation Needs: Not on file  Physical Activity: Not on file  Stress: Not on file  Social Connections: Not on file  Intimate Partner Violence: Not on file      Review of Systems  Constitutional: Negative for chills, fatigue and unexpected weight change.  HENT: Negative for congestion, rhinorrhea, sneezing and sore throat.   Eyes: Negative for redness.  Respiratory: Negative for cough, chest tightness and shortness of breath.   Cardiovascular: Negative for chest pain and palpitations.  Gastrointestinal: Negative for abdominal pain, constipation, diarrhea, nausea and vomiting.  Genitourinary: Negative for dysuria and frequency.  Musculoskeletal: Negative for arthralgias, back pain, joint swelling and neck pain.  Skin: Negative for rash.  Neurological: Negative.  Negative for tremors and numbness.  Hematological: Negative for adenopathy. Does not bruise/bleed easily.  Psychiatric/Behavioral: Negative for behavioral problems (Depression), sleep disturbance and suicidal ideas.  The patient is not nervous/anxious.     Vital Signs: BP (!) 156/90   Pulse 92   Temp 98.3 F (36.8 C)   Resp 16   Ht 5' 7"  (1.702 m)   Wt 195 lb 6.4 oz (88.6 kg)   SpO2 96%   BMI 30.60 kg/m    Physical Exam Constitutional:      General: He is not in acute distress.    Appearance: He is well-developed. He is obese. He is not diaphoretic.  HENT:     Head: Normocephalic and atraumatic.     Mouth/Throat:     Pharynx: No oropharyngeal exudate.  Eyes:     Pupils: Pupils are equal, round, and reactive to light.  Neck:     Thyroid: No thyromegaly.     Vascular: No JVD.     Trachea: No tracheal deviation.  Cardiovascular:     Rate and Rhythm: Normal rate and regular rhythm.     Heart sounds: Normal heart  sounds. No murmur heard. No friction rub. No gallop.   Pulmonary:     Effort: Pulmonary effort is normal. No respiratory distress.     Breath sounds: No wheezing or rales.  Chest:     Chest wall: No tenderness.  Abdominal:     General: Bowel sounds are normal.     Palpations: Abdomen is soft.  Musculoskeletal:        General: Normal range of motion.     Cervical back: Normal range of motion and neck supple.  Lymphadenopathy:     Cervical: No cervical adenopathy.  Skin:    General: Skin is warm and dry.  Neurological:     Mental Status: He is alert and oriented to person, place, and time.     Cranial Nerves: No cranial nerve deficit.  Psychiatric:        Behavior: Behavior normal.        Thought Content: Thought content normal.        Judgment: Judgment normal.        Assessment/Plan: 1. Essential hypertension Unable to accurately assess medication management due to med noncompliance. Pt will log BP, note which meds he is taking and bring medications next visit. - amLODipine (NORVASC) 10 MG tablet; Take 1 tablet (10 mg total) by mouth daily.  Dispense: 90 tablet; Refill: 1 - cloNIDine (CATAPRES) 0.1 MG tablet; Take 1 tablet (0.1 mg total) by mouth 2 (two) times  daily.  Dispense: 90 tablet; Refill: 1 - hydrochlorothiazide (MICROZIDE) 12.5 MG capsule; Take 2 capsules (25 mg total) by mouth daily.  Dispense: 90 capsule; Refill: 1 - losartan (COZAAR) 100 MG tablet; Take one tablet by mouth daily  Dispense: 90 tablet; Refill: 0  2. Type 2 diabetes mellitus with hyperglycemia, without long-term current use of insulin (HCC) - POCT HgB A1C is 10.8 today. This is likely due to noncompliance with medications. Pt will resume medications as prescribed and bring a log of BG next visit. - metFORMIN (GLUCOPHAGE) 500 MG tablet; Take 2 tablets (1,000 mg total) by mouth 2 (two) times daily.  Dispense: 90 tablet; Refill: 0 - sitaGLIPtin (JANUVIA) 100 MG tablet; Take 1 tablet (100 mg total) by mouth daily.  Dispense: 90 tablet; Refill: 0  3. Mixed hyperlipidemia Will recheck labs. Continue on atorvastatin. - atorvastatin (LIPITOR) 40 MG tablet; Take 1 tablet (40 mg total) by mouth daily.  Dispense: 90 tablet; Refill: 0  4. Nonadherence to medication Educated on the importance of taking all medications as prescribed.  5. Erectile dysfunction, unspecified erectile dysfunction type - tadalafil (CIALIS) 5 MG tablet; Take 1 tablet (5 mg total) by mouth daily.  Dispense: 5 tablet; Refill: 0  6. Other fatigue Order age appropriate labs to be done prior to physical. - CBC with Differential/Platelet; Future - Lipid Panel With LDL/HDL Ratio; Future - TSH; Future - T4, free; Future - Comprehensive metabolic panel  General Counseling: dennies coate understanding of the findings of todays visit and agrees with plan of treatment. I have discussed any further diagnostic evaluation that may be needed or ordered today. We also reviewed his medications today. he has been encouraged to call the office with any questions or concerns that should arise related to todays visit.    Orders Placed This Encounter  Procedures  . CBC with Differential/Platelet  . Lipid Panel With  LDL/HDL Ratio  . TSH  . T4, free  . Comprehensive metabolic panel  . POCT HgB A1C    Meds ordered this encounter  Medications  . amLODipine (NORVASC) 10 MG tablet    Sig: Take 1 tablet (10 mg total) by mouth daily.    Dispense:  90 tablet    Refill:  1    Pt needs an appt for further refills.  Marland Kitchen atorvastatin (LIPITOR) 40 MG tablet    Sig: Take 1 tablet (40 mg total) by mouth daily.    Dispense:  90 tablet    Refill:  0    PATIENT NEEDS REFILLS. THANKS  . cetirizine (ZYRTEC) 10 MG tablet    Sig: Take 1 tablet (10 mg total) by mouth daily.    Dispense:  90 tablet    Refill:  1    Please consider 90 day supplies to promote better adherence  . cloNIDine (CATAPRES) 0.1 MG tablet    Sig: Take 1 tablet (0.1 mg total) by mouth 2 (two) times daily.    Dispense:  90 tablet    Refill:  1    Pt needs an appt for further refills. Please consider 90 day supplies to promote better adherence  . hydrochlorothiazide (MICROZIDE) 12.5 MG capsule    Sig: Take 2 capsules (25 mg total) by mouth daily.    Dispense:  90 capsule    Refill:  1    Please note change in dosing.  Marland Kitchen losartan (COZAAR) 100 MG tablet    Sig: Take one tablet by mouth daily    Dispense:  90 tablet    Refill:  0    Pt needs an appt for further refills  . metFORMIN (GLUCOPHAGE) 500 MG tablet    Sig: Take 2 tablets (1,000 mg total) by mouth 2 (two) times daily.    Dispense:  90 tablet    Refill:  0    Pt needs an appt for further refill  . metoprolol tartrate (LOPRESSOR) 25 MG tablet    Sig: Take 1 tablet (25 mg total) by mouth daily.    Dispense:  90 tablet    Refill:  0  . sitaGLIPtin (JANUVIA) 100 MG tablet    Sig: Take 1 tablet (100 mg total) by mouth daily.    Dispense:  90 tablet    Refill:  0    Pt need appt for further refills  . tadalafil (CIALIS) 5 MG tablet    Sig: Take 1 tablet (5 mg total) by mouth daily.    Dispense:  5 tablet    Refill:  0    Total time spent:30 Minutes Time spent includes review  of chart, medications, test results, and follow up plan with the patient.      Dr Lavera Guise Internal medicine

## 2020-03-15 ENCOUNTER — Other Ambulatory Visit: Payer: Self-pay | Admitting: Internal Medicine

## 2020-03-15 DIAGNOSIS — E1165 Type 2 diabetes mellitus with hyperglycemia: Secondary | ICD-10-CM

## 2020-03-19 ENCOUNTER — Other Ambulatory Visit: Payer: Self-pay | Admitting: Physician Assistant

## 2020-03-19 ENCOUNTER — Ambulatory Visit: Payer: Medicare PPO | Admitting: Physician Assistant

## 2020-03-19 DIAGNOSIS — R5383 Other fatigue: Secondary | ICD-10-CM

## 2020-04-16 ENCOUNTER — Ambulatory Visit: Payer: Medicare PPO | Admitting: Physician Assistant

## 2020-04-16 ENCOUNTER — Other Ambulatory Visit: Payer: Self-pay | Admitting: Internal Medicine

## 2020-04-16 DIAGNOSIS — I10 Essential (primary) hypertension: Secondary | ICD-10-CM

## 2020-04-16 DIAGNOSIS — E1165 Type 2 diabetes mellitus with hyperglycemia: Secondary | ICD-10-CM

## 2020-04-17 ENCOUNTER — Other Ambulatory Visit: Payer: Self-pay

## 2020-04-17 DIAGNOSIS — E1165 Type 2 diabetes mellitus with hyperglycemia: Secondary | ICD-10-CM

## 2020-04-17 MED ORDER — METFORMIN HCL 500 MG PO TABS: 2.0000 | ORAL_TABLET | Freq: Two times a day (BID) | ORAL | 3 refills | Status: DC

## 2020-04-17 NOTE — Telephone Encounter (Signed)
Pt need appt for refills  ?

## 2020-05-31 ENCOUNTER — Other Ambulatory Visit: Payer: Self-pay | Admitting: Internal Medicine

## 2020-05-31 DIAGNOSIS — I10 Essential (primary) hypertension: Secondary | ICD-10-CM

## 2020-05-31 NOTE — Telephone Encounter (Signed)
Pt is non adherent to follow up visit. I declined his prescriptions

## 2020-06-19 ENCOUNTER — Telehealth: Payer: Self-pay

## 2020-06-19 NOTE — Telephone Encounter (Signed)
Patient was mailed a discharge letter for non compliancy after being verbally advised he needed to keep his last scheduled office visit and arrive or there would be a possibility that we would be unable to rs for future appointments. Joshua Donovan

## 2020-07-06 ENCOUNTER — Other Ambulatory Visit: Payer: Self-pay | Admitting: Internal Medicine

## 2020-07-06 DIAGNOSIS — E782 Mixed hyperlipidemia: Secondary | ICD-10-CM

## 2020-11-02 DIAGNOSIS — I10 Essential (primary) hypertension: Secondary | ICD-10-CM | POA: Diagnosis not present

## 2020-11-02 DIAGNOSIS — E669 Obesity, unspecified: Secondary | ICD-10-CM | POA: Diagnosis not present

## 2020-11-02 DIAGNOSIS — K219 Gastro-esophageal reflux disease without esophagitis: Secondary | ICD-10-CM | POA: Diagnosis not present

## 2020-11-02 DIAGNOSIS — Z7722 Contact with and (suspected) exposure to environmental tobacco smoke (acute) (chronic): Secondary | ICD-10-CM | POA: Diagnosis not present

## 2020-11-02 DIAGNOSIS — Z6830 Body mass index (BMI) 30.0-30.9, adult: Secondary | ICD-10-CM | POA: Diagnosis not present

## 2020-11-02 DIAGNOSIS — E119 Type 2 diabetes mellitus without complications: Secondary | ICD-10-CM | POA: Diagnosis not present

## 2020-11-02 DIAGNOSIS — E785 Hyperlipidemia, unspecified: Secondary | ICD-10-CM | POA: Diagnosis not present

## 2020-11-02 DIAGNOSIS — N529 Male erectile dysfunction, unspecified: Secondary | ICD-10-CM | POA: Diagnosis not present

## 2020-11-02 DIAGNOSIS — J309 Allergic rhinitis, unspecified: Secondary | ICD-10-CM | POA: Diagnosis not present

## 2021-03-15 DIAGNOSIS — Z1329 Encounter for screening for other suspected endocrine disorder: Secondary | ICD-10-CM | POA: Diagnosis not present

## 2021-03-15 DIAGNOSIS — Z0130 Encounter for examination of blood pressure without abnormal findings: Secondary | ICD-10-CM | POA: Diagnosis not present

## 2021-03-15 DIAGNOSIS — E119 Type 2 diabetes mellitus without complications: Secondary | ICD-10-CM | POA: Diagnosis not present

## 2021-03-15 DIAGNOSIS — I10 Essential (primary) hypertension: Secondary | ICD-10-CM | POA: Diagnosis not present

## 2021-03-15 DIAGNOSIS — Z0000 Encounter for general adult medical examination without abnormal findings: Secondary | ICD-10-CM | POA: Diagnosis not present

## 2021-03-15 DIAGNOSIS — Z1389 Encounter for screening for other disorder: Secondary | ICD-10-CM | POA: Diagnosis not present

## 2021-03-15 DIAGNOSIS — E785 Hyperlipidemia, unspecified: Secondary | ICD-10-CM | POA: Diagnosis not present

## 2021-04-26 DIAGNOSIS — I10 Essential (primary) hypertension: Secondary | ICD-10-CM | POA: Diagnosis not present

## 2021-04-26 DIAGNOSIS — E785 Hyperlipidemia, unspecified: Secondary | ICD-10-CM | POA: Diagnosis not present

## 2021-04-26 DIAGNOSIS — Z0000 Encounter for general adult medical examination without abnormal findings: Secondary | ICD-10-CM | POA: Diagnosis not present

## 2021-04-26 DIAGNOSIS — E119 Type 2 diabetes mellitus without complications: Secondary | ICD-10-CM | POA: Diagnosis not present

## 2021-04-26 DIAGNOSIS — Z0130 Encounter for examination of blood pressure without abnormal findings: Secondary | ICD-10-CM | POA: Diagnosis not present

## 2021-04-26 DIAGNOSIS — Z1159 Encounter for screening for other viral diseases: Secondary | ICD-10-CM | POA: Diagnosis not present

## 2021-06-15 DIAGNOSIS — Z0131 Encounter for examination of blood pressure with abnormal findings: Secondary | ICD-10-CM | POA: Diagnosis not present

## 2021-06-15 DIAGNOSIS — Z1389 Encounter for screening for other disorder: Secondary | ICD-10-CM | POA: Diagnosis not present

## 2021-06-15 DIAGNOSIS — Z712 Person consulting for explanation of examination or test findings: Secondary | ICD-10-CM | POA: Diagnosis not present

## 2021-06-15 DIAGNOSIS — I10 Essential (primary) hypertension: Secondary | ICD-10-CM | POA: Diagnosis not present

## 2021-08-02 ENCOUNTER — Other Ambulatory Visit: Payer: Self-pay

## 2021-08-02 ENCOUNTER — Emergency Department: Payer: Medicare PPO

## 2021-08-02 ENCOUNTER — Inpatient Hospital Stay
Admission: EM | Admit: 2021-08-02 | Discharge: 2021-08-03 | DRG: 065 | Disposition: A | Discharge status: Other Acute Inpatient Hospital | Payer: Medicare PPO | Attending: Obstetrics and Gynecology | Admitting: Obstetrics and Gynecology

## 2021-08-02 DIAGNOSIS — I6389 Other cerebral infarction: Principal | ICD-10-CM | POA: Diagnosis present

## 2021-08-02 DIAGNOSIS — Z87891 Personal history of nicotine dependence: Secondary | ICD-10-CM | POA: Diagnosis not present

## 2021-08-02 DIAGNOSIS — Z882 Allergy status to sulfonamides status: Secondary | ICD-10-CM | POA: Diagnosis not present

## 2021-08-02 DIAGNOSIS — I639 Cerebral infarction, unspecified: Secondary | ICD-10-CM | POA: Diagnosis present

## 2021-08-02 DIAGNOSIS — R270 Ataxia, unspecified: Secondary | ICD-10-CM | POA: Diagnosis present

## 2021-08-02 DIAGNOSIS — I161 Hypertensive emergency: Secondary | ICD-10-CM | POA: Diagnosis present

## 2021-08-02 DIAGNOSIS — Z79899 Other long term (current) drug therapy: Secondary | ICD-10-CM

## 2021-08-02 DIAGNOSIS — R200 Anesthesia of skin: Secondary | ICD-10-CM | POA: Diagnosis not present

## 2021-08-02 DIAGNOSIS — E119 Type 2 diabetes mellitus without complications: Secondary | ICD-10-CM | POA: Diagnosis present

## 2021-08-02 DIAGNOSIS — G8314 Monoplegia of lower limb affecting left nondominant side: Secondary | ICD-10-CM | POA: Diagnosis not present

## 2021-08-02 DIAGNOSIS — R29818 Other symptoms and signs involving the nervous system: Secondary | ICD-10-CM

## 2021-08-02 DIAGNOSIS — Z833 Family history of diabetes mellitus: Secondary | ICD-10-CM | POA: Diagnosis not present

## 2021-08-02 DIAGNOSIS — M79671 Pain in right foot: Secondary | ICD-10-CM | POA: Diagnosis present

## 2021-08-02 DIAGNOSIS — E876 Hypokalemia: Secondary | ICD-10-CM | POA: Diagnosis present

## 2021-08-02 DIAGNOSIS — G9389 Other specified disorders of brain: Secondary | ICD-10-CM | POA: Diagnosis present

## 2021-08-02 DIAGNOSIS — Z888 Allergy status to other drugs, medicaments and biological substances status: Secondary | ICD-10-CM | POA: Diagnosis not present

## 2021-08-02 DIAGNOSIS — M47812 Spondylosis without myelopathy or radiculopathy, cervical region: Secondary | ICD-10-CM | POA: Diagnosis not present

## 2021-08-02 DIAGNOSIS — G458 Other transient cerebral ischemic attacks and related syndromes: Secondary | ICD-10-CM | POA: Diagnosis present

## 2021-08-02 DIAGNOSIS — Z8249 Family history of ischemic heart disease and other diseases of the circulatory system: Secondary | ICD-10-CM | POA: Diagnosis not present

## 2021-08-02 DIAGNOSIS — F1010 Alcohol abuse, uncomplicated: Secondary | ICD-10-CM | POA: Diagnosis present

## 2021-08-02 DIAGNOSIS — M7989 Other specified soft tissue disorders: Secondary | ICD-10-CM | POA: Diagnosis not present

## 2021-08-02 DIAGNOSIS — I771 Stricture of artery: Secondary | ICD-10-CM

## 2021-08-02 DIAGNOSIS — R202 Paresthesia of skin: Secondary | ICD-10-CM | POA: Diagnosis not present

## 2021-08-02 DIAGNOSIS — G8191 Hemiplegia, unspecified affecting right dominant side: Secondary | ICD-10-CM | POA: Diagnosis present

## 2021-08-02 DIAGNOSIS — I10 Essential (primary) hypertension: Secondary | ICD-10-CM | POA: Diagnosis present

## 2021-08-02 DIAGNOSIS — E7800 Pure hypercholesterolemia, unspecified: Secondary | ICD-10-CM | POA: Diagnosis present

## 2021-08-02 DIAGNOSIS — Z7984 Long term (current) use of oral hypoglycemic drugs: Secondary | ICD-10-CM | POA: Diagnosis not present

## 2021-08-02 DIAGNOSIS — Z8673 Personal history of transient ischemic attack (TIA), and cerebral infarction without residual deficits: Secondary | ICD-10-CM

## 2021-08-02 DIAGNOSIS — I672 Cerebral atherosclerosis: Secondary | ICD-10-CM | POA: Diagnosis present

## 2021-08-02 DIAGNOSIS — G319 Degenerative disease of nervous system, unspecified: Secondary | ICD-10-CM | POA: Diagnosis not present

## 2021-08-02 DIAGNOSIS — K219 Gastro-esophageal reflux disease without esophagitis: Secondary | ICD-10-CM | POA: Diagnosis present

## 2021-08-02 LAB — CBC WITH DIFFERENTIAL/PLATELET
Abs Immature Granulocytes: 0.02 10*3/uL (ref 0.00–0.07)
Basophils Absolute: 0.1 10*3/uL (ref 0.0–0.1)
Basophils Relative: 1 %
Eosinophils Absolute: 0.1 10*3/uL (ref 0.0–0.5)
Eosinophils Relative: 1 %
HCT: 43 % (ref 39.0–52.0)
Hemoglobin: 14.2 g/dL (ref 13.0–17.0)
Immature Granulocytes: 0 %
Lymphocytes Relative: 23 %
Lymphs Abs: 1.5 10*3/uL (ref 0.7–4.0)
MCH: 28.2 pg (ref 26.0–34.0)
MCHC: 33 g/dL (ref 30.0–36.0)
MCV: 85.5 fL (ref 80.0–100.0)
Monocytes Absolute: 0.5 10*3/uL (ref 0.1–1.0)
Monocytes Relative: 8 %
Neutro Abs: 4.3 10*3/uL (ref 1.7–7.7)
Neutrophils Relative %: 67 %
Platelets: 296 10*3/uL (ref 150–400)
RBC: 5.03 MIL/uL (ref 4.22–5.81)
RDW: 14 % (ref 11.5–15.5)
WBC: 6.4 10*3/uL (ref 4.0–10.5)
nRBC: 0 % (ref 0.0–0.2)

## 2021-08-02 LAB — URINALYSIS, ROUTINE W REFLEX MICROSCOPIC
Bilirubin Urine: NEGATIVE
Glucose, UA: 50 mg/dL — AB
Hgb urine dipstick: NEGATIVE
Ketones, ur: NEGATIVE mg/dL
Leukocytes,Ua: NEGATIVE
Nitrite: NEGATIVE
Protein, ur: NEGATIVE mg/dL
Specific Gravity, Urine: 1.018 (ref 1.005–1.030)
pH: 6 (ref 5.0–8.0)

## 2021-08-02 LAB — COMPREHENSIVE METABOLIC PANEL
ALT: 28 U/L (ref 0–44)
AST: 26 U/L (ref 15–41)
Albumin: 4.2 g/dL (ref 3.5–5.0)
Alkaline Phosphatase: 29 U/L — ABNORMAL LOW (ref 38–126)
Anion gap: 10 (ref 5–15)
BUN: 14 mg/dL (ref 8–23)
CO2: 25 mmol/L (ref 22–32)
Calcium: 9.9 mg/dL (ref 8.9–10.3)
Chloride: 101 mmol/L (ref 98–111)
Creatinine, Ser: 0.7 mg/dL (ref 0.61–1.24)
GFR, Estimated: >60 mL/min (ref >60)
Glucose, Bld: 182 mg/dL — ABNORMAL HIGH (ref 70–99)
Potassium: 3.1 mmol/L — ABNORMAL LOW (ref 3.5–5.1)
Sodium: 136 mmol/L (ref 135–145)
Total Bilirubin: 0.5 mg/dL (ref 0.3–1.2)
Total Protein: 7.3 g/dL (ref 6.5–8.1)

## 2021-08-02 LAB — URIC ACID: Uric Acid, Serum: 5.2 mg/dL (ref 3.7–8.6)

## 2021-08-02 MED ORDER — CLONIDINE HCL 0.1 MG PO TABS
ORAL_TABLET | ORAL | Status: AC
  Filled 2021-08-02: qty 2

## 2021-08-02 MED ORDER — CLONIDINE HCL 0.1 MG PO TABS
ORAL_TABLET | ORAL | Status: AC
  Filled 2021-08-02: qty 1

## 2021-08-02 MED ORDER — CLONIDINE HCL 0.1 MG PO TABS
0.2000 mg | ORAL_TABLET | Freq: Once | ORAL | Status: AC
  Administered 2021-08-02: 0.2 mg via ORAL

## 2021-08-02 NOTE — ED Provider Triage Note (Signed)
Emergency Medicine Provider Triage Evaluation Note  Joshua Donovan , a 66 y.o. male  was evaluated in triage.  Pt complains of R hand numbness, new R hand tremor, R great toe MTP joint pain and edema.  Review of Systems  Positive: R hand numbness, new R hand tremor, R great toe MTP joint pain and edema. Negative: HA, CP, SOB, unilateral weakness  Physical Exam  BP (!) 219/96 (BP Location: Right Arm)   Pulse 72   Temp 98.3 F (36.8 C) (Oral)   Resp 16   Ht '5\' 7"'$  (1.702 m)   Wt 83.9 kg   SpO2 98%   BMI 28.98 kg/m  Gen:   Awake, no distress   Resp:  Normal effort  MSK:   Moves extremities without difficulty. MTP joint edema. R hand tremor  Other:  Cranial nerves grossly intact  Medical Decision Making  Medically screening exam initiated at 8:28 PM.  Appropriate orders placed.  Joshua Donovan was informed that the remainder of the evaluation will be completed by another provider, this initial triage assessment does not replace that evaluation, and the importance of remaining in the ED until their evaluation is complete.  Patient will have labs and imaging   Brynda Peon 08/02/21 2028

## 2021-08-02 NOTE — ED Triage Notes (Signed)
R arm and leg pain that is chronic in nature. States pain is worse since Sunday without known fall or injury. R leg is cool to touch. No swelling or redness noted. Swelling noted to R great toe. Pt also reports R arm pain and again denies fall or injury. Pedal pulse palpable to R foot. Cap refill WNL

## 2021-08-02 NOTE — ED Provider Notes (Signed)
[facility] Provider Note    Event Date/Time   First MD Initiated Contact with Patient 08/02/21 2105     (approximate)   History   Leg Pain and Arm Pain   HPI {Remember to add pertinent medical, surgical, social, and/or OB history to HPI:1} Joshua Donovan is a 66 y.o. male  ***       Physical Exam   Triage Vital Signs: ED Triage Vitals  Enc Vitals Group     BP 08/02/21 2010 (!) 219/96     Pulse Rate 08/02/21 2010 72     Resp 08/02/21 2010 16     Temp 08/02/21 2010 98.3 F (36.8 C)     Temp Source 08/02/21 2010 Oral     SpO2 08/02/21 2010 98 %     Weight 08/02/21 2023 185 lb (83.9 kg)     Height 08/02/21 2023 '5\' 7"'$  (1.702 m)     Head Circumference --      Peak Flow --      Pain Score 08/02/21 2023 8     Pain Loc --      Pain Edu? --      Excl. in [place]? --     Most recent vital signs: Vitals:   08/02/21 2010  BP: (!) 219/96  Pulse: 72  Resp: 16  Temp: 98.3 F (36.8 C)  SpO2: 98%    {Only need to document appropriate and relevant physical exam:1} General: Awake, no distress. *** CV:  Good peripheral perfusion. *** Resp:  Normal effort. *** Abd:  No distention. *** Other:  ***   ED Results / Procedures / Treatments   Labs (all labs ordered are listed, but only abnormal results are displayed) Labs Reviewed  COMPREHENSIVE METABOLIC PANEL - Abnormal; Notable for the following components:      Result Value   Potassium 3.1 (*)    Glucose, Bld 182 (*)    Alkaline Phosphatase 29 (*)    All other components within normal limits  URINALYSIS, ROUTINE W REFLEX MICROSCOPIC - Abnormal; Notable for the following components:   Color, Urine YELLOW (*)    APPearance HAZY (*)    Glucose, UA 50 (*)    All other components within normal limits  URIC ACID  CBC WITH DIFFERENTIAL/PLATELET     EKG  ***   RADIOLOGY *** {USE THE WORD "INTERPRETED"!! You MUST document your own interpretation of imaging, as well as the fact that you  reviewed the radiologist's report!:1}   PROCEDURES:  Critical Care performed: {CriticalCareYesNo:19197::"Yes, see critical care procedure note(s)","No"}  Procedures   MEDICATIONS ORDERED IN ED: Medications  cloNIDine (CATAPRES) tablet 0.2 mg (has no administration in time range)     IMPRESSION / MDM / ASSESSMENT AND PLAN / ED COURSE  I reviewed the triage vital signs and the nursing notes.                              Differential diagnosis includes, but is not limited to, ***  Patient's presentation is most consistent with {EM COPA:27473}  {If the patient is on the monitor, remove the brackets and asterisks on the sentence below and remember to document it as a Procedure as well. Otherwise delete the sentence below:1} {**The patient is on the cardiac monitor to evaluate for evidence of arrhythmia and/or significant heart rate changes.**} {Remember to include, when applicable, any/all of the following data: independent review of imaging independent review  of labs (comment specifically on pertinent positives and negatives) review of specific prior hospitalizations, PCP/specialist notes, etc. discuss meds given and prescribed document any discussion with consultants (including hospitalists) any clinical decision tools you used and why (PECARN, NEXUS, etc.) did you consider admitting the patient? document social determinants of health affecting patient's care (homelessness, inability to follow up in a timely fashion, etc) document any pre-existing conditions increasing risk on current visit (e.g. diabetes and HTN increasing danger of high-risk chest pain/ACS) describes what meds you gave (especially parenteral) and why any other interventions?:1}     FINAL CLINICAL IMPRESSION(S) / ED DIAGNOSES   Final diagnoses:  None     Rx / DC Orders   ED Discharge Orders     None        Note:  This document was prepared using Dragon voice recognition software and may  include unintentional dictation errors.

## 2021-08-03 ENCOUNTER — Observation Stay: Payer: Medicare PPO

## 2021-08-03 ENCOUNTER — Inpatient Hospital Stay (HOSPITAL_COMMUNITY)
Admission: AD | Admit: 2021-08-03 | Discharge: 2021-11-26 | DRG: 003 | Disposition: A | Discharge status: Skilled Nursing Facility | Payer: Medicare PPO | Source: Other Acute Inpatient Hospital | Attending: Internal Medicine | Admitting: Internal Medicine

## 2021-08-03 ENCOUNTER — Inpatient Hospital Stay
Admission: AD | Admit: 2021-08-03 | Payer: Medicare PPO | Source: Other Acute Inpatient Hospital | Admitting: Obstetrics and Gynecology

## 2021-08-03 ENCOUNTER — Encounter (HOSPITAL_COMMUNITY): Payer: Self-pay | Admitting: Internal Medicine

## 2021-08-03 ENCOUNTER — Observation Stay
Admit: 2021-08-03 | Discharge: 2021-08-03 | Disposition: A | Discharge status: Home / Self Care | Payer: Medicare PPO | Attending: Internal Medicine | Admitting: Internal Medicine

## 2021-08-03 DIAGNOSIS — Y838 Other surgical procedures as the cause of abnormal reaction of the patient, or of later complication, without mention of misadventure at the time of the procedure: Secondary | ICD-10-CM | POA: Diagnosis not present

## 2021-08-03 DIAGNOSIS — J9503 Malfunction of tracheostomy stoma: Secondary | ICD-10-CM | POA: Diagnosis not present

## 2021-08-03 DIAGNOSIS — I614 Nontraumatic intracerebral hemorrhage in cerebellum: Secondary | ICD-10-CM | POA: Diagnosis not present

## 2021-08-03 DIAGNOSIS — J9501 Hemorrhage from tracheostomy stoma: Secondary | ICD-10-CM | POA: Diagnosis not present

## 2021-08-03 DIAGNOSIS — E11649 Type 2 diabetes mellitus with hypoglycemia without coma: Secondary | ICD-10-CM | POA: Diagnosis not present

## 2021-08-03 DIAGNOSIS — I472 Ventricular tachycardia, unspecified: Secondary | ICD-10-CM | POA: Diagnosis not present

## 2021-08-03 DIAGNOSIS — G450 Vertebro-basilar artery syndrome: Secondary | ICD-10-CM | POA: Diagnosis not present

## 2021-08-03 DIAGNOSIS — Y95 Nosocomial condition: Secondary | ICD-10-CM | POA: Diagnosis not present

## 2021-08-03 DIAGNOSIS — J9811 Atelectasis: Secondary | ICD-10-CM | POA: Diagnosis present

## 2021-08-03 DIAGNOSIS — Z882 Allergy status to sulfonamides status: Secondary | ICD-10-CM | POA: Diagnosis not present

## 2021-08-03 DIAGNOSIS — Z87820 Personal history of traumatic brain injury: Secondary | ICD-10-CM

## 2021-08-03 DIAGNOSIS — E876 Hypokalemia: Secondary | ICD-10-CM | POA: Diagnosis not present

## 2021-08-03 DIAGNOSIS — R042 Hemoptysis: Secondary | ICD-10-CM | POA: Diagnosis not present

## 2021-08-03 DIAGNOSIS — L24A2 Irritant contact dermatitis due to fecal, urinary or dual incontinence: Secondary | ICD-10-CM | POA: Diagnosis not present

## 2021-08-03 DIAGNOSIS — G8191 Hemiplegia, unspecified affecting right dominant side: Secondary | ICD-10-CM | POA: Diagnosis not present

## 2021-08-03 DIAGNOSIS — Z930 Tracheostomy status: Secondary | ICD-10-CM | POA: Diagnosis not present

## 2021-08-03 DIAGNOSIS — E782 Mixed hyperlipidemia: Secondary | ICD-10-CM | POA: Diagnosis not present

## 2021-08-03 DIAGNOSIS — Z888 Allergy status to other drugs, medicaments and biological substances status: Secondary | ICD-10-CM | POA: Diagnosis not present

## 2021-08-03 DIAGNOSIS — G9389 Other specified disorders of brain: Secondary | ICD-10-CM | POA: Diagnosis present

## 2021-08-03 DIAGNOSIS — I10 Essential (primary) hypertension: Secondary | ICD-10-CM | POA: Diagnosis not present

## 2021-08-03 DIAGNOSIS — Z79899 Other long term (current) drug therapy: Secondary | ICD-10-CM | POA: Diagnosis not present

## 2021-08-03 DIAGNOSIS — G8314 Monoplegia of lower limb affecting left nondominant side: Secondary | ICD-10-CM | POA: Diagnosis not present

## 2021-08-03 DIAGNOSIS — I69320 Aphasia following cerebral infarction: Secondary | ICD-10-CM

## 2021-08-03 DIAGNOSIS — Z7984 Long term (current) use of oral hypoglycemic drugs: Secondary | ICD-10-CM | POA: Diagnosis not present

## 2021-08-03 DIAGNOSIS — Z430 Encounter for attention to tracheostomy: Secondary | ICD-10-CM | POA: Diagnosis not present

## 2021-08-03 DIAGNOSIS — I6389 Other cerebral infarction: Principal | ICD-10-CM | POA: Diagnosis present

## 2021-08-03 DIAGNOSIS — J411 Mucopurulent chronic bronchitis: Secondary | ICD-10-CM | POA: Diagnosis not present

## 2021-08-03 DIAGNOSIS — I6501 Occlusion and stenosis of right vertebral artery: Secondary | ICD-10-CM | POA: Diagnosis not present

## 2021-08-03 DIAGNOSIS — L89159 Pressure ulcer of sacral region, unspecified stage: Secondary | ICD-10-CM | POA: Diagnosis not present

## 2021-08-03 DIAGNOSIS — Z87891 Personal history of nicotine dependence: Secondary | ICD-10-CM

## 2021-08-03 DIAGNOSIS — J9500 Unspecified tracheostomy complication: Secondary | ICD-10-CM | POA: Diagnosis not present

## 2021-08-03 DIAGNOSIS — R1310 Dysphagia, unspecified: Secondary | ICD-10-CM | POA: Diagnosis not present

## 2021-08-03 DIAGNOSIS — Z833 Family history of diabetes mellitus: Secondary | ICD-10-CM | POA: Diagnosis not present

## 2021-08-03 DIAGNOSIS — Z794 Long term (current) use of insulin: Secondary | ICD-10-CM

## 2021-08-03 DIAGNOSIS — F1010 Alcohol abuse, uncomplicated: Secondary | ICD-10-CM

## 2021-08-03 DIAGNOSIS — I4729 Other ventricular tachycardia: Secondary | ICD-10-CM | POA: Diagnosis not present

## 2021-08-03 DIAGNOSIS — R1110 Vomiting, unspecified: Secondary | ICD-10-CM | POA: Diagnosis not present

## 2021-08-03 DIAGNOSIS — R6339 Other feeding difficulties: Secondary | ICD-10-CM | POA: Diagnosis not present

## 2021-08-03 DIAGNOSIS — I639 Cerebral infarction, unspecified: Secondary | ICD-10-CM | POA: Diagnosis not present

## 2021-08-03 DIAGNOSIS — R7989 Other specified abnormal findings of blood chemistry: Secondary | ICD-10-CM | POA: Diagnosis not present

## 2021-08-03 DIAGNOSIS — E46 Unspecified protein-calorie malnutrition: Secondary | ICD-10-CM | POA: Diagnosis not present

## 2021-08-03 DIAGNOSIS — R471 Dysarthria and anarthria: Secondary | ICD-10-CM | POA: Diagnosis present

## 2021-08-03 DIAGNOSIS — G835 Locked-in state: Secondary | ICD-10-CM | POA: Diagnosis not present

## 2021-08-03 DIAGNOSIS — I672 Cerebral atherosclerosis: Secondary | ICD-10-CM | POA: Diagnosis present

## 2021-08-03 DIAGNOSIS — I119 Hypertensive heart disease without heart failure: Secondary | ICD-10-CM | POA: Diagnosis present

## 2021-08-03 DIAGNOSIS — R1312 Dysphagia, oropharyngeal phase: Secondary | ICD-10-CM | POA: Diagnosis present

## 2021-08-03 DIAGNOSIS — Z9911 Dependence on respirator [ventilator] status: Secondary | ICD-10-CM | POA: Diagnosis not present

## 2021-08-03 DIAGNOSIS — I63211 Cerebral infarction due to unspecified occlusion or stenosis of right vertebral arteries: Secondary | ICD-10-CM | POA: Diagnosis not present

## 2021-08-03 DIAGNOSIS — Z7901 Long term (current) use of anticoagulants: Secondary | ICD-10-CM

## 2021-08-03 DIAGNOSIS — R270 Ataxia, unspecified: Secondary | ICD-10-CM | POA: Diagnosis present

## 2021-08-03 DIAGNOSIS — J690 Pneumonitis due to inhalation of food and vomit: Secondary | ICD-10-CM | POA: Diagnosis not present

## 2021-08-03 DIAGNOSIS — R29818 Other symptoms and signs involving the nervous system: Secondary | ICD-10-CM

## 2021-08-03 DIAGNOSIS — I6523 Occlusion and stenosis of bilateral carotid arteries: Secondary | ICD-10-CM | POA: Diagnosis not present

## 2021-08-03 DIAGNOSIS — K219 Gastro-esophageal reflux disease without esophagitis: Secondary | ICD-10-CM | POA: Diagnosis not present

## 2021-08-03 DIAGNOSIS — Z8249 Family history of ischemic heart disease and other diseases of the circulatory system: Secondary | ICD-10-CM

## 2021-08-03 DIAGNOSIS — E1165 Type 2 diabetes mellitus with hyperglycemia: Secondary | ICD-10-CM | POA: Diagnosis not present

## 2021-08-03 DIAGNOSIS — E119 Type 2 diabetes mellitus without complications: Secondary | ICD-10-CM | POA: Diagnosis not present

## 2021-08-03 DIAGNOSIS — J9690 Respiratory failure, unspecified, unspecified whether with hypoxia or hypercapnia: Secondary | ICD-10-CM | POA: Diagnosis not present

## 2021-08-03 DIAGNOSIS — G890 Central pain syndrome: Secondary | ICD-10-CM | POA: Diagnosis not present

## 2021-08-03 DIAGNOSIS — M79671 Pain in right foot: Secondary | ICD-10-CM

## 2021-08-03 DIAGNOSIS — J9601 Acute respiratory failure with hypoxia: Secondary | ICD-10-CM | POA: Diagnosis present

## 2021-08-03 DIAGNOSIS — I2510 Atherosclerotic heart disease of native coronary artery without angina pectoris: Secondary | ICD-10-CM | POA: Diagnosis present

## 2021-08-03 DIAGNOSIS — Z955 Presence of coronary angioplasty implant and graft: Secondary | ICD-10-CM

## 2021-08-03 DIAGNOSIS — J15211 Pneumonia due to Methicillin susceptible Staphylococcus aureus: Secondary | ICD-10-CM | POA: Diagnosis not present

## 2021-08-03 DIAGNOSIS — I161 Hypertensive emergency: Secondary | ICD-10-CM

## 2021-08-03 DIAGNOSIS — Z7902 Long term (current) use of antithrombotics/antiplatelets: Secondary | ICD-10-CM

## 2021-08-03 DIAGNOSIS — Z9889 Other specified postprocedural states: Secondary | ICD-10-CM | POA: Diagnosis not present

## 2021-08-03 DIAGNOSIS — I272 Pulmonary hypertension, unspecified: Secondary | ICD-10-CM | POA: Diagnosis not present

## 2021-08-03 DIAGNOSIS — J1569 Pneumonia due to other gram-negative bacteria: Secondary | ICD-10-CM | POA: Diagnosis not present

## 2021-08-03 DIAGNOSIS — Z4682 Encounter for fitting and adjustment of non-vascular catheter: Secondary | ICD-10-CM | POA: Diagnosis not present

## 2021-08-03 DIAGNOSIS — Z7982 Long term (current) use of aspirin: Secondary | ICD-10-CM

## 2021-08-03 DIAGNOSIS — R4701 Aphasia: Secondary | ICD-10-CM | POA: Diagnosis present

## 2021-08-03 DIAGNOSIS — I6629 Occlusion and stenosis of unspecified posterior cerebral artery: Secondary | ICD-10-CM | POA: Diagnosis present

## 2021-08-03 DIAGNOSIS — E7800 Pure hypercholesterolemia, unspecified: Secondary | ICD-10-CM | POA: Diagnosis present

## 2021-08-03 DIAGNOSIS — I700 Atherosclerosis of aorta: Secondary | ICD-10-CM | POA: Diagnosis present

## 2021-08-03 DIAGNOSIS — J189 Pneumonia, unspecified organism: Secondary | ICD-10-CM | POA: Diagnosis not present

## 2021-08-03 DIAGNOSIS — I6502 Occlusion and stenosis of left vertebral artery: Secondary | ICD-10-CM | POA: Diagnosis present

## 2021-08-03 DIAGNOSIS — I6322 Cerebral infarction due to unspecified occlusion or stenosis of basilar arteries: Secondary | ICD-10-CM | POA: Diagnosis not present

## 2021-08-03 DIAGNOSIS — E8809 Other disorders of plasma-protein metabolism, not elsewhere classified: Secondary | ICD-10-CM | POA: Diagnosis not present

## 2021-08-03 DIAGNOSIS — J40 Bronchitis, not specified as acute or chronic: Secondary | ICD-10-CM | POA: Diagnosis not present

## 2021-08-03 DIAGNOSIS — G8250 Quadriplegia, unspecified: Secondary | ICD-10-CM | POA: Diagnosis not present

## 2021-08-03 DIAGNOSIS — Z8673 Personal history of transient ischemic attack (TIA), and cerebral infarction without residual deficits: Secondary | ICD-10-CM | POA: Diagnosis not present

## 2021-08-03 DIAGNOSIS — I651 Occlusion and stenosis of basilar artery: Secondary | ICD-10-CM | POA: Diagnosis present

## 2021-08-03 DIAGNOSIS — Z7401 Bed confinement status: Secondary | ICD-10-CM

## 2021-08-03 DIAGNOSIS — I63219 Cerebral infarction due to unspecified occlusion or stenosis of unspecified vertebral arteries: Secondary | ICD-10-CM | POA: Diagnosis not present

## 2021-08-03 DIAGNOSIS — K2970 Gastritis, unspecified, without bleeding: Secondary | ICD-10-CM | POA: Diagnosis present

## 2021-08-03 DIAGNOSIS — R0902 Hypoxemia: Secondary | ICD-10-CM | POA: Diagnosis not present

## 2021-08-03 DIAGNOSIS — L8990 Pressure ulcer of unspecified site, unspecified stage: Secondary | ICD-10-CM | POA: Insufficient documentation

## 2021-08-03 DIAGNOSIS — G458 Other transient cerebral ischemic attacks and related syndromes: Secondary | ICD-10-CM | POA: Diagnosis not present

## 2021-08-03 DIAGNOSIS — R110 Nausea: Secondary | ICD-10-CM | POA: Diagnosis not present

## 2021-08-03 DIAGNOSIS — I69391 Dysphagia following cerebral infarction: Secondary | ICD-10-CM

## 2021-08-03 DIAGNOSIS — R7401 Elevation of levels of liver transaminase levels: Secondary | ICD-10-CM | POA: Diagnosis not present

## 2021-08-03 HISTORY — DX: Hypertensive emergency: I16.1

## 2021-08-03 LAB — LIPID PANEL
Cholesterol: 203 mg/dL — ABNORMAL HIGH (ref 0–200)
HDL: 52 mg/dL (ref >40)
LDL Cholesterol: 116 mg/dL — ABNORMAL HIGH (ref 0–99)
Total CHOL/HDL Ratio: 3.9 RATIO
Triglycerides: 175 mg/dL — ABNORMAL HIGH (ref <150)
VLDL: 35 mg/dL (ref 0–40)

## 2021-08-03 LAB — CBG MONITORING, ED
Glucose-Capillary: 144 mg/dL — ABNORMAL HIGH (ref 70–99)
Glucose-Capillary: 165 mg/dL — ABNORMAL HIGH (ref 70–99)
Glucose-Capillary: 186 mg/dL — ABNORMAL HIGH (ref 70–99)
Glucose-Capillary: 221 mg/dL — ABNORMAL HIGH (ref 70–99)
Glucose-Capillary: 243 mg/dL — ABNORMAL HIGH (ref 70–99)

## 2021-08-03 LAB — HEMOGLOBIN A1C
Hgb A1c MFr Bld: 8.3 % — ABNORMAL HIGH (ref 4.8–5.6)
Mean Plasma Glucose: 191.51 mg/dL

## 2021-08-03 LAB — BASIC METABOLIC PANEL
Anion gap: 11 (ref 5–15)
BUN: 11 mg/dL (ref 8–23)
CO2: 24 mmol/L (ref 22–32)
Calcium: 9 mg/dL (ref 8.9–10.3)
Chloride: 101 mmol/L (ref 98–111)
Creatinine, Ser: 0.76 mg/dL (ref 0.61–1.24)
GFR, Estimated: >60 mL/min (ref >60)
Glucose, Bld: 212 mg/dL — ABNORMAL HIGH (ref 70–99)
Potassium: 3.4 mmol/L — ABNORMAL LOW (ref 3.5–5.1)
Sodium: 136 mmol/L (ref 135–145)

## 2021-08-03 LAB — HEPARIN LEVEL (UNFRACTIONATED): Heparin Unfractionated: 0.16 IU/mL — ABNORMAL LOW (ref 0.30–0.70)

## 2021-08-03 LAB — GLUCOSE, CAPILLARY: Glucose-Capillary: 162 mg/dL — ABNORMAL HIGH (ref 70–99)

## 2021-08-03 LAB — APTT: aPTT: 28 s (ref 24–36)

## 2021-08-03 LAB — MAGNESIUM: Magnesium: 1.6 mg/dL — ABNORMAL LOW (ref 1.7–2.4)

## 2021-08-03 LAB — HIV ANTIBODY (ROUTINE TESTING W REFLEX): HIV Screen 4th Generation wRfx: NONREACTIVE

## 2021-08-03 MED ORDER — AMLODIPINE BESYLATE 5 MG PO TABS: 10.0000 mg | ORAL_TABLET | Freq: Every day | ORAL | Status: DC

## 2021-08-03 MED ORDER — ACETAMINOPHEN 160 MG/5ML PO SOLN
650.0000 mg | ORAL | Status: DC | PRN
  Administered 2021-08-07 – 2021-11-18 (×28): 650 mg
  Filled 2021-08-03 (×31): qty 20.3

## 2021-08-03 MED ORDER — ATORVASTATIN CALCIUM 20 MG PO TABS
80.0000 mg | ORAL_TABLET | Freq: Every day | ORAL | Status: DC
  Administered 2021-08-03: 80 mg via ORAL
  Filled 2021-08-03: qty 4

## 2021-08-03 MED ORDER — HEPARIN (PORCINE) 25000 UT/250ML-% IV SOLN
1150.0000 [IU]/h | INTRAVENOUS | Status: DC
  Administered 2021-08-03: 1000 [IU]/h via INTRAVENOUS
  Filled 2021-08-03: qty 250

## 2021-08-03 MED ORDER — LOSARTAN POTASSIUM 50 MG PO TABS: 100.0000 mg | ORAL_TABLET | Freq: Every day | ORAL | Status: DC

## 2021-08-03 MED ORDER — THIAMINE HCL 100 MG PO TABS
100.0000 mg | ORAL_TABLET | Freq: Every day | ORAL | Status: DC
  Administered 2021-08-03: 100 mg via ORAL
  Filled 2021-08-03: qty 1

## 2021-08-03 MED ORDER — STROKE: EARLY STAGES OF RECOVERY BOOK
Freq: Once | Status: AC
  Filled 2021-08-03: qty 1

## 2021-08-03 MED ORDER — INSULIN GLARGINE-YFGN 100 UNIT/ML ~~LOC~~ SOLN
5.0000 [IU] | Freq: Every day | SUBCUTANEOUS | Status: DC
  Administered 2021-08-03: 5 [IU] via SUBCUTANEOUS
  Filled 2021-08-03: qty 0.05

## 2021-08-03 MED ORDER — ASPIRIN 325 MG PO TABS
325.0000 mg | ORAL_TABLET | Freq: Every day | ORAL | Status: AC
  Administered 2021-08-03: 325 mg via ORAL
  Filled 2021-08-03: qty 1

## 2021-08-03 MED ORDER — ACETAMINOPHEN 650 MG RE SUPP: 650.0000 mg | RECTAL | Status: DC | PRN

## 2021-08-03 MED ORDER — IOHEXOL 350 MG/ML SOLN
75.0000 mL | Freq: Once | INTRAVENOUS | Status: AC | PRN
  Administered 2021-08-03: 75 mL via INTRAVENOUS

## 2021-08-03 MED ORDER — INSULIN GLARGINE-YFGN 100 UNIT/ML ~~LOC~~ SOLN
5.0000 [IU] | Freq: Every day | SUBCUTANEOUS | Status: DC
  Filled 2021-08-03: qty 0.05

## 2021-08-03 MED ORDER — ASPIRIN 300 MG RE SUPP
300.0000 mg | Freq: Every day | RECTAL | Status: DC
Start: 2021-08-03 — End: 2021-08-03

## 2021-08-03 MED ORDER — INSULIN ASPART 100 UNIT/ML IJ SOLN
0.0000 [IU] | Freq: Every day | INTRAMUSCULAR | Status: DC
  Administered 2021-08-04: 5 [IU] via SUBCUTANEOUS

## 2021-08-03 MED ORDER — ACETAMINOPHEN 325 MG PO TABS: 650.0000 mg | ORAL_TABLET | ORAL | Status: DC | PRN

## 2021-08-03 MED ORDER — MAGNESIUM SULFATE 2 GM/50ML IV SOLN
2.0000 g | Freq: Once | INTRAVENOUS | Status: AC
  Administered 2021-08-03: 2 g via INTRAVENOUS
  Filled 2021-08-03: qty 50

## 2021-08-03 MED ORDER — TICAGRELOR 90 MG PO TABS
180.0000 mg | ORAL_TABLET | Freq: Once | ORAL | Status: AC
Start: 2021-08-03 — End: 2021-08-03
  Administered 2021-08-03: 180 mg via ORAL
  Filled 2021-08-03: qty 2

## 2021-08-03 MED ORDER — ADULT MULTIVITAMIN W/MINERALS CH
1.0000 | ORAL_TABLET | Freq: Every day | ORAL | Status: DC
Start: 2021-08-03 — End: 2021-08-03
  Administered 2021-08-03: 1 via ORAL
  Filled 2021-08-03: qty 1

## 2021-08-03 MED ORDER — ATORVASTATIN CALCIUM 80 MG PO TABS: 80.0000 mg | ORAL_TABLET | Freq: Every day | ORAL | Status: DC

## 2021-08-03 MED ORDER — ENOXAPARIN SODIUM 40 MG/0.4ML IJ SOSY
40.0000 mg | PREFILLED_SYRINGE | INTRAMUSCULAR | Status: DC
Start: 2021-08-03 — End: 2021-08-03

## 2021-08-03 MED ORDER — ASPIRIN 325 MG PO TABS
325.0000 mg | ORAL_TABLET | Freq: Every day | ORAL | Status: DC
  Filled 2021-08-03: qty 1

## 2021-08-03 MED ORDER — INSULIN ASPART 100 UNIT/ML IJ SOLN
0.0000 [IU] | Freq: Every day | INTRAMUSCULAR | Status: DC
  Administered 2021-08-03: 2 [IU] via SUBCUTANEOUS
  Filled 2021-08-03: qty 1

## 2021-08-03 MED ORDER — METOPROLOL TARTRATE 25 MG PO TABS
25.0000 mg | ORAL_TABLET | Freq: Every day | ORAL | Status: DC
  Administered 2021-08-03: 25 mg via ORAL
  Filled 2021-08-03: qty 1

## 2021-08-03 MED ORDER — INSULIN ASPART 100 UNIT/ML IJ SOLN
0.0000 [IU] | Freq: Three times a day (TID) | INTRAMUSCULAR | Status: DC
  Administered 2021-08-04: 15 [IU] via SUBCUTANEOUS
  Administered 2021-08-04: 3 [IU] via SUBCUTANEOUS

## 2021-08-03 MED ORDER — ACETAMINOPHEN 650 MG RE SUPP
650.0000 mg | RECTAL | Status: DC | PRN
Start: 2021-08-03 — End: 2021-10-07

## 2021-08-03 MED ORDER — LORAZEPAM 1 MG PO TABS
1.0000 mg | ORAL_TABLET | ORAL | Status: DC | PRN
  Administered 2021-08-03: 1 mg via ORAL
  Filled 2021-08-03: qty 1

## 2021-08-03 MED ORDER — LORAZEPAM 2 MG/ML IJ SOLN: 1.0000 mg | INTRAMUSCULAR | Status: DC | PRN

## 2021-08-03 MED ORDER — INSULIN ASPART 100 UNIT/ML IJ SOLN
0.0000 [IU] | Freq: Three times a day (TID) | INTRAMUSCULAR | Status: DC
  Administered 2021-08-03: 2 [IU] via SUBCUTANEOUS
  Administered 2021-08-03: 5 [IU] via SUBCUTANEOUS
  Administered 2021-08-03: 3 [IU] via SUBCUTANEOUS
  Filled 2021-08-03 (×4): qty 1

## 2021-08-03 MED ORDER — POTASSIUM CHLORIDE CRYS ER 20 MEQ PO TBCR
40.0000 meq | EXTENDED_RELEASE_TABLET | Freq: Once | ORAL | Status: AC
  Administered 2021-08-03: 40 meq via ORAL
  Filled 2021-08-03: qty 2

## 2021-08-03 MED ORDER — ATORVASTATIN CALCIUM 80 MG PO TABS
80.0000 mg | ORAL_TABLET | Freq: Every day | ORAL | Status: DC
  Filled 2021-08-03: qty 1

## 2021-08-03 MED ORDER — ACETAMINOPHEN 325 MG PO TABS
650.0000 mg | ORAL_TABLET | ORAL | Status: DC | PRN
  Administered 2021-08-15 – 2021-09-09 (×3): 650 mg via ORAL
  Filled 2021-08-03 (×5): qty 2

## 2021-08-03 MED ORDER — FOLIC ACID 1 MG PO TABS
1.0000 mg | ORAL_TABLET | Freq: Every day | ORAL | Status: DC
Start: 2021-08-03 — End: 2021-08-03
  Administered 2021-08-03: 1 mg via ORAL
  Filled 2021-08-03: qty 1

## 2021-08-03 MED ORDER — ACETAMINOPHEN 160 MG/5ML PO SOLN: 650.0000 mg | ORAL | Status: DC | PRN

## 2021-08-03 MED ORDER — ASPIRIN 300 MG RE SUPP: 300.0000 mg | Freq: Every day | RECTAL | Status: AC

## 2021-08-03 MED ORDER — ATORVASTATIN CALCIUM 20 MG PO TABS: 40.0000 mg | ORAL_TABLET | Freq: Every day | ORAL | Status: DC

## 2021-08-03 MED ORDER — STROKE: EARLY STAGES OF RECOVERY BOOK: Freq: Once | Status: DC

## 2021-08-03 MED ORDER — THIAMINE HCL 100 MG/ML IJ SOLN
100.0000 mg | Freq: Every day | INTRAMUSCULAR | Status: DC
Start: 2021-08-03 — End: 2021-08-03

## 2021-08-03 MED ORDER — ASPIRIN 325 MG PO TBEC
650.0000 mg | DELAYED_RELEASE_TABLET | Freq: Once | ORAL | Status: AC
  Administered 2021-08-03: 650 mg via ORAL
  Filled 2021-08-03: qty 2

## 2021-08-03 NOTE — ED Notes (Signed)
Pt waiting on bed assignment to [facility] spoke w/ Umeka at [facility]

## 2021-08-03 NOTE — Assessment & Plan Note (Addendum)
Most recent hemoglobin A1C of 8.3%. uncontrolled with hyper- and hypoglycemia. -reduce dose of Levemir to 55 units daily and Novolog 10 units every 4 hours

## 2021-08-03 NOTE — Progress Notes (Signed)
OT Cancellation Note  Patient Details Name: Joshua Donovan MRN: [id number] DOB: 03-15-1955   Cancelled Treatment:    Reason Eval/Treat Not Completed: Other (comment). Consult received, chart reviewed. Per neurology, recommending transfer to [facility] for further care. Will hold OT evaluation at this time.   Ardeth Perfect., MPH, MS, OTR/L ascom [phone] 08/03/21, 11:36 AM

## 2021-08-03 NOTE — ED Notes (Signed)
   08/03/21 0749  NIH Stroke Scale ( + Modified Stroke Scale Criteria)   Complete NIHSS TOTAL 9   MD aware. MD assessed patient. MRI/CT ordered. Repeat swallow screen completed.

## 2021-08-03 NOTE — ED Notes (Signed)
Patient spilled urinal on self. Pt cleansed and soiled clothes removed. Clean underwear and gown applied after cleansing patient. Clean linens applied to bed after cleansing bed.

## 2021-08-03 NOTE — ED Notes (Signed)
Patient resting in bed free from sign of distress. Breathing unlabored speaking in full sentences with symmetric chest rise and fall. Bed low and locked with side rails raised x2. Call bell in reach and monitor in place.   

## 2021-08-03 NOTE — Assessment & Plan Note (Addendum)
BP on arrival 219/96 which could be causing neurologic symptoms and ataxia Given 2 days out from neurologic symptoms will start controlling blood pressure to goal SBP under 130 Patient given clonidine in the ED Resume home losartan and metoprolol

## 2021-08-03 NOTE — Progress Notes (Signed)
Lab notified of pending STAT collection.

## 2021-08-03 NOTE — Assessment & Plan Note (Signed)
Sliding scale insulin coverage 

## 2021-08-03 NOTE — Evaluation (Signed)
Physical Therapy Evaluation Patient Details Name: Joshua Donovan MRN: [id number] DOB: 1955-07-04 Today's Date: 08/03/2021  History of Present Illness  Joshua Donovan is a 66 y.o. male with history of diabetes, hypertension, and hyperlipidemia who presents with right foot pain and right hand numbness.    Clinical Impression  Pt received in Semi-Fowler's position and agreeable to therapy.  Pt agreeable to therapy and participated with good effort throughout the session.  Pt demonstrates significant weakness of the R side in both R UE and R LE.  Pt is unable to voluntarily contract the muscles, but does have muscle spasms of the R LE at times that causes him to perform quick SLR/ankle dorsiflexion.  During treatment session, therapist informed of pt being transferred to [facility] for further care.  Pt limited in functional ability at this time, but would greatly benefit from being d/c to [facility] based on initial evaluation.  Pt has good support system at home to return to following therapy/care.       Recommendations for follow up therapy are one component of a multi-disciplinary discharge planning process, led by the attending physician.  Recommendations may be updated based on patient status, additional functional criteria and insurance authorization.  Follow Up Recommendations Acute inpatient rehab (3hours/day)      Assistance Recommended at Discharge Frequent or constant Supervision/Assistance  Patient can return home with the following  A lot of help with walking and/or transfers;A lot of help with bathing/dressing/bathroom;Assistance with cooking/housework    Equipment Recommendations Other (comment) (defer to next venue of care.)  Recommendations for Other Services       Functional Status Assessment Patient has had a recent decline in their functional status and demonstrates the ability to make significant improvements in function in a reasonable and predictable amount of time.      Precautions / Restrictions Precautions Precautions: None Restrictions Weight Bearing Restrictions: No      Mobility  Bed Mobility               General bed mobility comments: deferred as pt is now being transferred to [facility]    Transfers                        Ambulation/Gait                  Stairs            Wheelchair Mobility    Modified Rankin (Stroke Patients Only)       Balance Overall balance assessment:  (not assessed.)                                           Pertinent Vitals/Pain Pain Assessment Pain Assessment: No/denies pain    Home Living Family/patient expects to be discharged to:: Private residence Living Arrangements: Alone Available Help at Discharge: Friend(s);Available 24 hours/day;Neighbor Type of Home: House Home Access: Stairs to enter Entrance Stairs-Rails: None Entrance Stairs-Number of Steps: 2   Home Layout: One level Home Equipment: Other (comment) (pt has neighbor that may be able to supply AD, but does not personally own.)      Prior Function Prior Level of Function : Independent/Modified Independent                     Hand Dominance   Dominant Hand: Right    Extremity/Trunk  Assessment   Upper Extremity Assessment Upper Extremity Assessment: RUE deficits/detail RUE Deficits / Details: inability to perform any AROM of the R UE RUE Sensation: WNL RUE Coordination: decreased gross motor;decreased fine motor    Lower Extremity Assessment Lower Extremity Assessment: RLE deficits/detail RLE Deficits / Details: inability to perform any AROM of the R LE RLE Sensation: WNL RLE Coordination: decreased gross motor;decreased fine motor       Communication   Communication: No difficulties  Cognition Arousal/Alertness: Awake/alert Behavior During Therapy: WFL for tasks assessed/performed Overall Cognitive Status: Impaired/Different from baseline Area of Impairment:   (Somewhat delayed cognitive skills.)                                        General Comments      Exercises     Assessment/Plan    PT Assessment Patient needs continued PT services  PT Problem List Decreased strength;Decreased range of motion;Decreased activity tolerance;Decreased balance;Decreased mobility;Decreased coordination;Decreased cognition;Decreased knowledge of use of DME;Decreased safety awareness       PT Treatment Interventions DME instruction;Gait training;Stair training;Functional mobility training;Therapeutic activities;Therapeutic exercise;Balance training;Neuromuscular re-education;Patient/family education    PT Goals (Current goals can be found in the Care Plan section)  Acute Rehab PT Goals Patient Stated Goal: to get better and back to normal. PT Goal Formulation: With patient/family Time For Goal Achievement: 08/17/21 Potential to Achieve Goals: Fair    Frequency 7X/week     Co-evaluation               AM-PAC PT "6 Clicks" Mobility  Outcome Measure Help needed turning from your back to your side while in a flat bed without using bedrails?: A Lot Help needed moving from lying on your back to sitting on the side of a flat bed without using bedrails?: A Lot Help needed moving to and from a bed to a chair (including a wheelchair)?: Total Help needed standing up from a chair using your arms (e.g., wheelchair or bedside chair)?: Total Help needed to walk in hospital room?: Total Help needed climbing 3-5 steps with a railing? : Total 6 Click Score: 8    End of Session   Activity Tolerance: Patient tolerated treatment well Patient left: in bed;with call bell/phone within reach;with bed alarm set;with nursing/sitter in room;with family/visitor present   PT Visit Diagnosis: Unsteadiness on feet (R26.81);Other abnormalities of gait and mobility (R26.89);Muscle weakness (generalized) (M62.81);Difficulty in walking, not elsewhere  classified (R26.2);Other symptoms and signs involving the nervous system (R29.898);Hemiplegia and hemiparesis Hemiplegia - Right/Left: Right Hemiplegia - dominant/non-dominant: Dominant Hemiplegia - caused by: Unspecified    Time: 1100-1116 PT Time Calculation (min) (ACUTE ONLY): 16 min   Charges:   PT Evaluation $PT Eval Moderate Complexity: 1 Mod          Gwenlyn Saran, PT, DPT 08/03/21, 12:17 PM   Christie Nottingham 08/03/2021, 12:12 PM

## 2021-08-03 NOTE — Assessment & Plan Note (Addendum)
-  Continue Lipitor °

## 2021-08-03 NOTE — Assessment & Plan Note (Addendum)
Initially managed on CIWA. Continue thiamine, multivitamin and folic acid

## 2021-08-03 NOTE — Progress Notes (Addendum)
Interim progress note - not for billing. Admitted earlier this morning  5mhx htn, dm, prior imaging consistent with a history of stroke, lives alone, presenting with 2 days trouble moving right foot that has progressed this morning to inability to move right leg and right arm. 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 Carotid dopplers with some atheromatous plaque, no significant stenosis. Ekg sinus rhythm. Hypertensive urgency on arrival resolved with clonidine. TTE has been performed, read is pending. Glucose is elevated. This morning appears to have progressive neurologic symptoms unable to move right arm whereas did have some movement when he arrived. Neurology is consulted. CTA has been ordered and given progressive neurologic symptoms neuro advises repeating MRI which I have ordered. Passed bedside swallow screen but given some dysarthria on exam and evolving neuro symptoms will request formal slp swallow eval. Pt and ot also consulted. Will hold home antihypertensives save metop pending neuro's evaluation. Will also increase atorva to 80 given elevated ldl. I have ordered aspirin to be given now, will defer choice of plavix and dosing to neurology. I called and updated daughter. He is a heavy drinker and will need alcohol cessation counseling. We will monitor on ciwa for the time being.

## 2021-08-03 NOTE — Assessment & Plan Note (Signed)
Oral repletion.  Monitor and replete as necessary

## 2021-08-03 NOTE — Discharge Summary (Signed)
Joshua Donovan ZOX:[id number] DOB: 1955-05-19 DOA: 08/02/2021  PCP: [facility], [place] date: 08/02/2021 Discharge date: 08/03/2021  Time spent: 45 minutes     Discharge Diagnoses:  Principal Problem:   Acute focal neurological deficit Active Problems:   Hypertensive emergency   Right foot pain   Hypokalemia   Diabetes mellitus without complication ([place])   Alcohol abuse   CVA (cerebral vascular accident) [facility])   Discharge Condition: stable  Diet recommendation: npo  Filed Weights   08/02/21 2023  Weight: 83.9 kg    History of present illness:  Joshua Donovan is a 66 y.o. 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 CTA shows severe intracranial atherosclerosis. Here treated with aspirin and started on stroke-protocol heparin. Neuro consulted, [facility], counterparts there Dr. XErlinda Hongfrom stroke team and Dr. AGerhard Perchesof neuro IR are aware and will need to be notified when patient arrives. Repeat MRI obtained prior to transfer given evolving neurologic symptoms. Of note patient is a heavy drinker and should be monitored for signs/symptoms of withdrawal.    Discharge Exam: Vitals:   08/03/21 0500 08/03/21 0855  BP: (!) 142/79 (!) 188/109  Pulse: 66 79  Resp: 15 19  Temp:  98.2 F (36.8 C)  SpO2: 94% 95%    General: NAD Cardiovascular: RRR Respiratory: CTAB Neuro: right sided weakness  Discharge Instructions   Discharge  Instructions     Increase activity slowly   Complete by: As directed       Allergies as of 08/03/2021       Reactions   Lisinopril Swelling   Ace Inhibitors Swelling   Anchovies [fish Allergy] Swelling   Sulfa Antibiotics Swelling        Medication List     STOP taking these medications    amLODipine 10 MG tablet Commonly known as: NORVASC   cloNIDine 0.1 MG tablet Commonly known as: CATAPRES   hydrochlorothiazide 12.5 MG capsule Commonly known as: MICROZIDE   losartan 100 MG tablet Commonly known as: COZAAR   metFORMIN 500 MG tablet Commonly known as: GLUCOPHAGE   pneumococcal 23 valent vaccine 25 MCG/0.5ML injection Commonly known as: PNEUMOVAX-23   sitaGLIPtin 100 MG tablet Commonly known as: Januvia   tadalafil 5 MG tablet Commonly known as: CIALIS       TAKE these medications    Accu-Chek Aviva Plus w/Device Kit Use as directed   Accu-Chek FastClix Lancets Misc Use as directed twice daily dia E11.65   atorvastatin 20 MG tablet Commonly known as: LIPITOR Take 20 mg by mouth daily. What changed: Another medication with the same name was changed. Make sure you understand how and when to take each.   atorvastatin 80 MG tablet Commonly known as: LIPITOR Take 1 tablet (80 mg total) by mouth daily. Start taking on: August 04, 2021 What changed:  medication strength how much to take   cetirizine 10 MG tablet Commonly known as: ZYRTEC Take 1 tablet (10  mg total) by mouth daily.   glucose blood test strip Commonly known as: Accu-Chek Aviva Plus Use two times daily to check blood sugar   metoprolol tartrate 25 MG tablet Commonly known as: LOPRESSOR Take 1 tablet (25 mg total) by mouth daily.       Allergies  Allergen Reactions   Lisinopril Swelling   Ace Inhibitors Swelling   Anchovies [Fish Allergy] Swelling   Sulfa Antibiotics Swelling      The results of significant diagnostics from this hospitalization (including imaging,  microbiology, ancillary and laboratory) are listed below for reference.    Significant Diagnostic Studies: CT ANGIO HEAD NECK W WO CM  Addendum Date: 08/03/2021   ADDENDUM REPORT: 08/03/2021 09:17 ADDENDUM: Study discussed by telephone with Dr. Amie Portland on 08/03/2021 at 0911 hours. Electronically Signed   By: Genevie Ann M.D.   On: 08/03/2021 09:17   Result Date: 08/03/2021 CLINICAL DATA:  66 year old male with neurologic deficit. Questionable small vessel ischemia of the medulla on brain MRI yesterday. EXAM: CT ANGIOGRAPHY HEAD AND NECK TECHNIQUE: Multidetector CT imaging of the head and neck was performed using the standard protocol during bolus administration of intravenous contrast. Multiplanar CT image reconstructions and MIPs were obtained to evaluate the vascular anatomy. Carotid stenosis measurements (when applicable) are obtained utilizing NASCET criteria, using the distal internal carotid diameter as the denominator. RADIATION DOSE REDUCTION: This exam was performed according to the departmental dose-optimization program which includes automated exposure control, adjustment of the mA and/or kV according to patient size and/or use of iterative reconstruction technique. CONTRAST:  15m OMNIPAQUE IOHEXOL 350 MG/ML SOLN COMPARISON:  Brain MRI yesterday.  Head CT yesterday. FINDINGS: CT HEAD Brain: Bulky Calcified atherosclerosis at the skull base. Stable CT appearance of the brainstem since yesterday. No acute intracranial hemorrhage identified. No midline shift, mass effect, or evidence of intracranial mass lesion. Chronic right inferior frontal gyrus encephalomalacia, patchy bilateral cerebral white matter hypodensity more pronounced on the left. 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 No ventriculomegaly or acute cortically based infarct. Calvarium and skull base: No acute osseous abnormality identified. Paranasal sinuses: Visualized paranasal sinuses and mastoids are stable and  well aerated. Orbits: Visualized orbits and scalp soft tissues are within normal limits. CTA NECK Skeleton: Periapical dental lucency bilaterally. Ordinary cervical spine degeneration. No acute osseous abnormality identified. Upper chest: Centrilobular and paraseptal emphysema. No superior mediastinal lymphadenopathy. Other neck: 15 mm hypodense right thyroid nodule. Recommend thyroid [place](ref: J Am Coll Radiol. 2015 Feb;12(2): 143-50).Dermal and subcutaneous probable 2.6 cm sebaceous cyst of the left submandibular face (series 13, image 79). Otherwise negative CT appearance of the neck soft tissues. Aortic arch: 3 vessel arch configuration. Mild arch atherosclerosis. Right carotid system: Negative brachiocephalic artery and proximal right CCA. Retropharyngeal course of the right carotid bifurcation with mild calcified plaque. Tortuous right ICA distal to the bulb, no stenosis. Left carotid system: Similar mild tortuosity and atherosclerosis with no hemodynamically significant stenosis. Vertebral arteries: Tortuous proximal right subclavian artery with a mildly kinked appearance in the superior mediastinum. Normal right vertebral artery origin. Patent right vertebral artery to the skull base with no significant plaque or stenosis. Mild to moderate soft and calcified plaque in the proximal left subclavian artery with less than 50 % stenosis with respect to the distal vessel. Mild atherosclerosis and stenosis at the left vertebral artery origin on series 13, image 159. Non dominant left vertebral artery than is patent to the skull base with no additional plaque or stenosis.  CTA HEAD Posterior circulation: Bilateral V4 segment calcified plaque. Severe stenosis on the left (series 12, image 139) upstream of the left PICA origin which remains patent. But the left V4 segment is occluded distal to the left PICA. Contralateral right V4 moderate tandem stenoses upstream of the patent right PICA origin on series 12, image 130.  And severe stenosis, near occlusion of the right vertebrobasilar junction (series 16, image 26). The basilar artery remains patent but is moderately stenotic in the mid basilar segment on series 16, image 24. Distal basilar remains patent along with SCA and PCA origins. Left posterior communicating artery is present, the right is diminutive or absent. a mild to moderate stenosis left PCA origin. And bilateral P2 segment severe stenosis, near occlusion on series 15, image 53. But there is preserved distal PCA branch enhancement bilaterally. Anterior circulation: Both ICA siphons are patent. However, on the left there is bulky soft plaque or thrombus in the supraclinoid ICA (series 14, image 162) this is between normal appearing left ophthalmic and posterior communicating artery origins. Left carotid terminus remains patent. Contralateral right ICA siphon with mild to moderate calcified plaque and mild supraclinoid stenosis. Patent carotid termini, MCA and ACA origins. Tortuous A1 segments. Normal anterior communicating artery. Proximal A2 segments are normal. There is severe right ACA distal A2 segment stenosis, near occlusion on series 17, image 32. Left MCA M1 segment is mildly irregular. Left MCA bifurcation is patent without stenosis. Left MCA branches are mildly irregular. Mild to moderate somewhat long segment right MCA M1 stenosis along 6 mm series 16, image 20. Distal M1 and right MCA trifurcation remain patent. Right MCA branches are mildly irregular. Venous sinuses: Early contrast timing, grossly patent. Anatomic variants: Mildly dominant right vertebral artery. Review of the MIP images confirms the above findings IMPRESSION: 1. 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 - Moderate stenosis Mid Basilar Artery. - Near Occlusion bilateral PCA P2 segments, distal right ACA  A2. - bulky soft plaque or thrombus in the Supraclinoid Left ICA, with up to Moderate stenosis. - mild-to-moderate Right MCA M1 stenosis. 2. Stable CT appearance of the brain since yesterday. No new intracranial abnormality. 3. Aortic Atherosclerosis (ICD10-I70.0) and Emphysema (ICD10-J43.9). Electronically Signed: By: Genevie Ann M.D. On: 08/03/2021 09:01   US Carotid Bilateral (at [facility] and [facility] only)  Result Date: 08/03/2021 CLINICAL DATA:  Follow-up examination for stroke. EXAM: BILATERAL CAROTID DUPLEX ULTRASOUND TECHNIQUE: Pearline Cables scale imaging, color Doppler and duplex ultrasound were performed of bilateral carotid and vertebral arteries in the neck. COMPARISON:  Prior Study from 04/03/2014. FINDINGS: Criteria: Quantification of carotid stenosis is based on velocity parameters that correlate the residual internal carotid diameter with NASCET-based stenosis levels, using the diameter of the distal internal carotid lumen as the denominator for stenosis measurement. The following velocity measurements were obtained: RIGHT ICA: 84/10 cm/sec CCA: 32/99 cm/sec SYSTOLIC ICA/CCA RATIO:  1.1 ECA: 64 cm/sec LEFT ICA: 64/16 cm/sec CCA: 242/68 cm/sec SYSTOLIC ICA/CCA RATIO:  0.9 ECA: 117 cm/sec RIGHT CAROTID ARTERY: Mild smooth intimal thickening seen within the visualized right CCA without significant stenosis. Mild heterogeneous mural based echogenic plaque seen about the right carotid bulb. No significant elevation in peak systolic velocity to suggest hemodynamically significant greater than 50% stenosis. Visualized right ICA patent distally without stenosis. RIGHT VERTEBRAL ARTERY:  Patent with antegrade flow. LEFT CAROTID ARTERY: Diffuse smooth intimal thickening seen within the visualized left  CCA without significant stenosis. Mild scattered heterogeneous echogenic plaque about the left carotid bulb/proximal left ICA. No significant elevation in peak systolic velocity to suggest hemodynamically significant greater than  50% stenosis. Visualized left ICA patent distally without visible stenosis. LEFT VERTEBRAL ARTERY:  Patent with antegrade flow. IMPRESSION: 1. Mild atherosclerotic plaque within the carotid bulbs and proximal ICAs bilaterally, but no hemodynamically significant greater than 50% stenosis. 2. Patent antegrade flow within both vertebral arteries. Electronically Signed   By: Jeannine Boga M.D.   On: 08/03/2021 03:26   MR Cervical Spine Wo Contrast  Result Date: 08/03/2021 CLINICAL DATA:  Initial evaluation for ataxia, right-sided numbness. EXAM: MRI CERVICAL SPINE WITHOUT CONTRAST TECHNIQUE: Multiplanar, multisequence MR imaging of the cervical spine was performed. No intravenous contrast was administered. COMPARISON:  None available. FINDINGS: Alignment: Examination degraded by motion artifact. Straightening with mild reversal of the normal cervical lordosis. No listhesis. Vertebrae: Vertebral body height maintained without acute or chronic fracture. Bone marrow signal intensity within normal limits. No discrete or worrisome osseous lesions or abnormal marrow edema. Cord: Normal signal and morphology. No convincing cord signal changes on this motion degraded exam. Posterior Fossa, vertebral arteries, paraspinal tissues: Unremarkable. Disc levels: C2-C3: Unremarkable. C3-C4: Mild disc bulge with endplate and uncovertebral spurring. Flattening and partial effacement of the ventral thecal sac with resultant mild spinal stenosis. Moderate bilateral C4 foraminal narrowing. C4-C5: Mild disc bulge with uncovertebral spurring. Mild flattening of the ventral thecal sac without significant spinal stenosis. Moderate right with mild left C5 foraminal stenosis. C5-C6: Minimal disc bulge with uncovertebral spurring. No spinal stenosis. Mild right C6 foraminal narrowing. Left neural foramen remains patent. C6-C7: Minimal annular disc bulge. No spinal stenosis. Foramina remain patent. C7-T1:  Unremarkable. Visualized upper  thoracic spine demonstrates no significant finding. IMPRESSION: 1. Motion degraded exam. 2. No acute abnormality within the cervical spine. 3. Mild multilevel cervical spondylosis with resultant mild spinal stenosis at C3-4. Associated moderate bilateral C4 foraminal stenosis, with mild right C5 and C6 foraminal narrowing. Electronically Signed   By: Jeannine Boga M.D.   On: 08/03/2021 01:04   MR BRAIN WO CONTRAST  Result Date: 08/03/2021 CLINICAL DATA:  Initial evaluation for neuro deficit, stroke suspected. EXAM: MRI HEAD WITHOUT CONTRAST TECHNIQUE: Multiplanar, multiecho pulse sequences of the brain and surrounding structures were obtained without intravenous contrast. COMPARISON:  Prior CT from earlier the same day as well as previous MRI from 09/17/2015. FINDINGS: Brain: Generalized age-related cerebral atrophy. Patchy T2/FLAIR hyperintensity involving the periventricular and deep white matter both cerebral hemispheres as well as the pons, most consistent with chronic small vessel ischemic disease, moderately advanced in nature. Area of encephalomalacia of involving parasagittal anterior/inferior right frontal lobe noted at site of prior hemorrhage. Additional smaller foci of encephalomalacia involving the inferior temporal poles bilaterally likely posttraumatic in nature. Few scattered small remote left cerebellar infarcts noted. Subtle area of diffusion signal abnormality involving the ventral medulla measuring up to approximately 8 mm is seen (a series 5, image 12). While this finding could potentially be artifactual, possibly due to prominent vascular calcifications about the adjacent vertebral arteries, a possible small acute ischemic infarct could be considered. No associated hemorrhage or mass effect. No other evidence for acute or subacute ischemia. No acute intracranial hemorrhage. Single chronic microhemorrhage noted within the right cerebellum, of doubtful significance in isolation. No  mass lesion, midline shift or mass effect. No hydrocephalus or extra-axial fluid collection. Pituitary gland and suprasellar region within normal limits. Vascular: Major intracranial vascular flow voids  are maintained. Skull and upper cervical spine: Craniocervical junction within normal limits. Bone marrow signal intensity normal. No scalp soft tissue abnormality. Sinuses/Orbits: Globes and orbital soft tissues within normal limits. Paranasal sinuses are largely clear. No mastoid effusion. Other: None. IMPRESSION: 1. 8 mm focus of diffusion signal abnormality involving the ventral medulla, indeterminate. While this finding could potentially be artifactual in nature, a possible small acute ischemic infarct is difficult to exclude, and could be considered in the correct clinical setting. No associated hemorrhage or mass effect. 2. No other acute intracranial abnormality. 3. Underlying atrophy with moderate chronic small vessel ischemic disease with a few small remote left cerebellar infarcts. Area of encephalomalacia of involving the anterior/inferior right frontal lobe at site of previous hemorrhage. Electronically Signed   By: Jeannine Boga M.D.   On: 08/03/2021 00:59   CT Head Wo Contrast  Result Date: 08/02/2021 CLINICAL DATA:  New right upper extremity tremor. Tingling to the arm. EXAM: CT HEAD WITHOUT CONTRAST TECHNIQUE: Contiguous axial images were obtained from the base of the skull through the vertex without intravenous contrast. RADIATION DOSE REDUCTION: This exam was performed according to the departmental dose-optimization program which includes automated exposure control, adjustment of the mA and/or kV according to patient size and/or use of iterative reconstruction technique. COMPARISON:  MRI brain 09/17/2015 FINDINGS: Brain: Mild cerebral atrophy. Mild ventricular dilatation consistent with central atrophy. Patchy low-attenuation changes in the deep white matter consistent small vessel  ischemic change. Old area of encephalomalacia in the right anterior frontal lobe corresponding to previous intraparenchymal hemorrhage on prior MRI. No acute mass effect or midline shift. No abnormal extra-axial fluid collections. Gray-white matter junctions are distinct. Basal cisterns are not effaced. No acute intracranial hemorrhage. Vascular: No hyperdense vessel or unexpected calcification. Skull: Normal. Negative for fracture or focal lesion. Sinuses/Orbits: Paranasal sinuses and mastoid air cells are clear. Other: None. IMPRESSION: No acute intracranial abnormalities. Chronic atrophy and small vessel ischemic changes. 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 Electronically Signed   By: Lucienne Capers M.D.   On: 08/02/2021 21:20   DG Foot Complete Right  Result Date: 08/02/2021 CLINICAL DATA:  Chronic RIGHT arm and leg pain for since Sunday, RIGHT leg cold to touch, swelling RIGHT great toe EXAM: RIGHT FOOT COMPLETE - 3+ VIEW COMPARISON:  None Available. FINDINGS: Osseous mineralization normal. 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 Remaining joint spaces preserved. No acute fracture, dislocation, or bone destruction. Small plantar calcaneal spur. IMPRESSION: Degenerative changes first MTP joint and small plantar calcaneal spur. No acute osseous abnormalities. Electronically Signed   By: Lavonia Dana M.D.   On: 08/02/2021 20:51    Microbiology: No results found for this or any previous visit (from the past 240 hour(s)).   Labs: Basic Metabolic Panel: Recent Labs  Lab 08/02/21 2027 08/03/21 0347  NA 136 136  K 3.1* 3.4*  CL 101 101  CO2 25 24  GLUCOSE 182* 212*  BUN 14 11  CREATININE 0.70 0.76  CALCIUM 9.9 9.0  MG  --  1.6*   Liver Function Tests: Recent Labs  Lab 08/02/21 2027  AST 26  ALT 28  ALKPHOS 29*  BILITOT 0.5  PROT 7.3  ALBUMIN 4.2   No results for input(s): "LIPASE", "AMYLASE" in the last 168 hours. No results for  input(s): "AMMONIA" in the last 168 hours. CBC: Recent Labs  Lab 08/02/21 2027  WBC 6.4  NEUTROABS 4.3  HGB 14.2  HCT 43.0  MCV 85.5  PLT 296   Cardiac Enzymes: No results for input(s): "CKTOTAL", "CKMB", "CKMBINDEX", "TROPONINI" in the last 168 hours. BNP: BNP (last 3 results) No results for input(s): "BNP" in the last 8760 hours.  ProBNP (last 3 results) No results for input(s): "PROBNP" in the last 8760 hours.  CBG: Recent Labs  Lab 08/03/21 0057 08/03/21 0746  GLUCAP 243* 221*       Signed:  Desma Maxim MD.  [facility] 08/03/2021, 10:56 AM

## 2021-08-03 NOTE — H&P (Signed)
History and Physical    Joshua Donovan QPY:[id number] DOB: Feb 15, 1955 DOA: 08/03/2021  DOS: the patient was seen and examined on 08/03/2021  PCP: [facility] [place]   Patient coming from:  [facility]  I have personally briefly reviewed patient's old medical records in [place]  CC: medulla CVA HPI: 66 year old African-American male initially admitted to [facility] on 08/03/2021 with weakness of his right arm and right leg.  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  No hemorrhage.  Patient seen by neurology.  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  Case was discussed with neuro interventional radiology.  Patient transferred to [facility] to undergo cerebral angiogram possible stenting.  Patient still having dense hemiplegia of the right upper extremity and right lower extremity.  He also has left upper extremity hemiparesis.  He was seen by physical therapy but not yet by Occupational Therapy.   Review of Systems:  Review of Systems  Constitutional: Negative.   HENT: Negative.    Eyes: Negative.   Respiratory: Negative.    Cardiovascular: Negative.   Gastrointestinal: Negative.   Genitourinary: Negative.   Musculoskeletal: Negative.   Skin: Negative.   Neurological:  Positive for focal weakness.       Still unable to move his right upper extremity or right lower extremity.  Having hand grip weakness of his left hand.  Still having weakness of his left leg but able to move it some.  Endo/Heme/Allergies: Negative.   Psychiatric/Behavioral:  Positive for substance abuse.        Alcohol abuse.  All other systems reviewed and are negative.   Past Medical History:  Diagnosis Date   Diabetes mellitus without complication (HCC)    GERD  (gastroesophageal reflux disease)    Hemorrhage in the brain ([place]) 08/2015   High cholesterol    Hyperlipidemia    Hypertension    Hypertensive emergency 08/03/2021    Past Surgical History:  Procedure Laterality Date   COLONOSCOPY WITH PROPOFOL N/A 09/08/2017   Procedure: COLONOSCOPY WITH PROPOFOL;  Surgeon: Lucilla Lame, MD;  Location: [employer];  Service: Endoscopy;  Laterality: N/A;  Diabetic - oral meds   partial intestine removed     approx date: 1980   POLYPECTOMY  09/08/2017   Procedure: POLYPECTOMY;  Surgeon: Lucilla Lame, MD;  Location: [place];  Service: Endoscopy;;     reports that he quit smoking about 33 years ago. He has never used smokeless tobacco. He reports current alcohol use of about 7.0 standard drinks of alcohol per week. He reports that he does not use drugs.  Allergies  Allergen Reactions   Lisinopril Swelling   Ace Inhibitors Swelling   Anchovies [Fish Allergy] Swelling   Sulfa Antibiotics Swelling    Family History  Problem Relation Age of Onset   Hypertension Father    Diabetes Sister     Prior to Admission medications   Medication Sig Start Date End Date Taking? Authorizing Provider  ACCU-CHEK FASTCLIX LANCETS MISC Use as directed twice daily dia E11.65 08/03/17   Lavera Guise, MD  atorvastatin (LIPITOR) 20 MG tablet Take 20 mg by mouth daily. 08/02/21   [provider]  atorvastatin (LIPITOR) 80 MG tablet Take 1 tablet (80 mg total) by mouth daily. 08/04/21   Wouk,  Ailene Rud, MD  Blood Glucose Monitoring Suppl (ACCU-CHEK AVIVA PLUS) w/Device KIT Use as directed 10/04/17   Ronnell Freshwater, NP  cetirizine (ZYRTEC) 10 MG tablet Take 1 tablet (10 mg total) by mouth daily. 02/20/20   Lavera Guise, MD  glucose blood (ACCU-CHEK AVIVA PLUS) test strip Use two times daily to check blood sugar 10/05/17   Ronnell Freshwater, NP  lisinopril (ZESTRIL) 40 MG tablet Take 40 mg by mouth daily.    [provider]  metFORMIN  (GLUCOPHAGE) 1000 MG tablet Take 1,000 mg by mouth 2 (two) times daily with a meal.    [provider]  metoprolol tartrate (LOPRESSOR) 25 MG tablet Take 1 tablet (25 mg total) by mouth daily. Patient not taking: Reported on 08/03/2021 02/20/20   Lavera Guise, MD    Physical Exam: Vitals:   08/03/21 2204  BP: (!) 176/99  Pulse: 78  Temp: 98.8 F (37.1 C)  TempSrc: Oral  SpO2: 100%    Physical Exam Vitals and nursing note reviewed.  Constitutional:      General: He is not in acute distress.    Appearance: He is not ill-appearing, toxic-appearing or diaphoretic.  HENT:     Head: Normocephalic and atraumatic.     Nose: Nose normal.  Cardiovascular:     Rate and Rhythm: Normal rate and regular rhythm.     Pulses: Normal pulses.  Pulmonary:     Effort: Pulmonary effort is normal. No respiratory distress.     Breath sounds: Normal breath sounds. No wheezing or rales.  Abdominal:     General: Bowel sounds are normal. There is no distension.     Tenderness: There is no abdominal tenderness. There is no guarding.  Musculoskeletal:     Right lower leg: No edema.     Left lower leg: No edema.  Skin:    General: Skin is warm and dry.     Capillary Refill: Capillary refill takes less than 2 seconds.  Neurological:     Mental Status: He is alert and oriented to person, place, and time.     Comments: Dense hemiplegia of the right upper extremity, right hand, right lower extremity  Plus 3-4 out of 5 left hand grip strength. Unable to to keep his left leg in the air against gravity.  Normal left plantar and dorsiflexion.      Labs on Admission: I have personally reviewed following labs and imaging studies  CBC: Recent Labs  Lab 08/02/21 2027  WBC 6.4  NEUTROABS 4.3  HGB 14.2  HCT 43.0  MCV 85.5  PLT 604   Basic Metabolic Panel: Recent Labs  Lab 08/02/21 2027 08/03/21 0347  NA 136 136  K 3.1* 3.4*  CL 101 101  CO2 25 24  GLUCOSE 182* 212*  BUN 14 11   CREATININE 0.70 0.76  CALCIUM 9.9 9.0  MG  --  1.6*   GFR: Estimated Creatinine Clearance: 94 mL/min (by C-G formula based on SCr of 0.76 mg/dL). Liver Function Tests: Recent Labs  Lab 08/02/21 2027  AST 26  ALT 28  ALKPHOS 29*  BILITOT 0.5  PROT 7.3  ALBUMIN 4.2   No results for input(s): "LIPASE", "AMYLASE" in the last 168 hours. No results for input(s): "AMMONIA" in the last 168 hours. Coagulation Profile: No results for input(s): "INR", "PROTIME" in the last 168 hours. Cardiac Enzymes: No results for input(s): "CKTOTAL", "CKMB", "CKMBINDEX", "TROPONINI", "TROPONINIHS" in the last 168 hours. BNP (last 3 results)  No results for input(s): "PROBNP" in the last 8760 hours. HbA1C: Recent Labs    08/03/21 0347  HGBA1C 8.3*   CBG: Recent Labs  Lab 08/03/21 0746 08/03/21 1112 08/03/21 1633 08/03/21 2104 08/03/21 2232  GLUCAP 221* 186* 144* 165* 162*   Lipid Profile: Recent Labs    08/03/21 0347  CHOL 203*  HDL 52  LDLCALC 116*  TRIG 175*  CHOLHDL 3.9   Thyroid Function Tests: No results for input(s): "TSH", "T4TOTAL", "FREET4", "T3FREE", "THYROIDAB" in the last 72 hours. Anemia Panel: No results for input(s): "VITAMINB12", "FOLATE", "FERRITIN", "TIBC", "IRON", "RETICCTPCT" in the last 72 hours. Urine analysis:    Component Value Date/Time   COLORURINE YELLOW (A) 08/02/2021 2027   APPEARANCEUR HAZY (A) 08/02/2021 2027   APPEARANCEUR Clear 01/22/2019 0000   LABSPEC 1.018 08/02/2021 2027   PHURINE 6.0 08/02/2021 2027   GLUCOSEU 50 (A) 08/02/2021 2027   HGBUR NEGATIVE 08/02/2021 2027   BILIRUBINUR NEGATIVE 08/02/2021 2027   BILIRUBINUR Negative 01/22/2019 0000   KETONESUR NEGATIVE 08/02/2021 2027   PROTEINUR NEGATIVE 08/02/2021 2027   NITRITE NEGATIVE 08/02/2021 2027   LEUKOCYTESUR NEGATIVE 08/02/2021 2027    Radiological Exams on Admission: I have personally reviewed images MR BRAIN WO CONTRAST  Result Date: 08/03/2021 CLINICAL DATA:  66 year old  male with severe intracranial atherosclerosis and questionable medullary ischemia on brain MRI yesterday. EXAM: MRI HEAD WITHOUT CONTRAST TECHNIQUE: Multiplanar, multiecho pulse sequences of the brain and surrounding structures were obtained without intravenous contrast. COMPARISON:  CTA head and neck this morning.  Brain MRI yesterday. FINDINGS: Brain: More convincing appearance of ventral medullary restricted diffusion, asymmetrically greater to the left of midline. See series 5, image 12, series 7, images 18 and 19. Mild if any associated T2 and FLAIR hyperintensity there. And no other convincing diffusion restriction despite the CTA findings earlier today. 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 Chronic encephalomalacia right inferior frontal gyrus with hemosiderin. Occasional chronic microhemorrhages elsewhere, right cerebellum (series 13, image 19). No new signal abnormality compared to yesterday. No midline shift, mass effect, evidence of mass lesion, ventriculomegaly, extra-axial collection or acute intracranial hemorrhage. Cervicomedullary junction and pituitary are within normal limits. Vascular: Major intracranial vascular flow voids are stable from yesterday. See also CTA today reported separately. Skull and upper cervical spine: Negative. Visualized bone marrow signal is within normal limits. Sinuses/Orbits: Stable, negative. Other: Visible internal auditory structures appear normal. Mastoids are clear. Negative visible scalp and face. IMPRESSION: 1. Positive for ischemia/infarction of the ventral Medulla as suspected yesterday, slightly greater on the left. No associated hemorrhage or mass effect. 2. 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 These results were communicated to Dr. Rory Percy at 11:06 am on  08/03/2021 by text page via the [facility] messaging system. Electronically Signed   By: Genevie Ann M.D.   On: 08/03/2021 11:06   CT ANGIO HEAD NECK W WO CM  Addendum Date: 08/03/2021   ADDENDUM REPORT: 08/03/2021 09:17 ADDENDUM: Study discussed by telephone with Dr. Amie Portland on 08/03/2021 at 0911 hours. Electronically Signed   By: Genevie Ann M.D.   On: 08/03/2021 09:17   Result Date: 08/03/2021 CLINICAL DATA:  66 year old male with neurologic deficit. Questionable small vessel ischemia of the medulla on brain MRI yesterday. EXAM: CT ANGIOGRAPHY HEAD AND NECK TECHNIQUE: Multidetector CT imaging of the head and neck was performed using the  standard protocol during bolus administration of intravenous contrast. Multiplanar CT image reconstructions and MIPs were obtained to evaluate the vascular anatomy. Carotid stenosis measurements (when applicable) are obtained utilizing NASCET criteria, using the distal internal carotid diameter as the denominator. RADIATION DOSE REDUCTION: This exam was performed according to the departmental dose-optimization program which includes automated exposure control, adjustment of the mA and/or kV according to patient size and/or use of iterative reconstruction technique. CONTRAST:  52m OMNIPAQUE IOHEXOL 350 MG/ML SOLN COMPARISON:  Brain MRI yesterday.  Head CT yesterday. FINDINGS: CT HEAD Brain: Bulky Calcified atherosclerosis at the skull base. Stable CT appearance of the brainstem since yesterday. No acute intracranial hemorrhage identified. No midline shift, mass effect, or evidence of intracranial mass lesion. Chronic right inferior frontal gyrus encephalomalacia, patchy bilateral cerebral white matter hypodensity more pronounced on the left. 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 No ventriculomegaly or acute cortically based infarct. Calvarium and skull base: No acute osseous abnormality identified. Paranasal sinuses: Visualized paranasal sinuses and mastoids  are stable and well aerated. Orbits: Visualized orbits and scalp soft tissues are within normal limits. CTA NECK Skeleton: Periapical dental lucency bilaterally. Ordinary cervical spine degeneration. No acute osseous abnormality identified. Upper chest: Centrilobular and paraseptal emphysema. No superior mediastinal lymphadenopathy. Other neck: 15 mm hypodense right thyroid nodule. Recommend thyroid [place](ref: J Am Coll Radiol. 2015 Feb;12(2): 143-50).Dermal and subcutaneous probable 2.6 cm sebaceous cyst of the left submandibular face (series 13, image 79). Otherwise negative CT appearance of the neck soft tissues. Aortic arch: 3 vessel arch configuration. Mild arch atherosclerosis. Right carotid system: Negative brachiocephalic artery and proximal right CCA. Retropharyngeal course of the right carotid bifurcation with mild calcified plaque. Tortuous right ICA distal to the bulb, no stenosis. Left carotid system: Similar mild tortuosity and atherosclerosis with no hemodynamically significant stenosis. Vertebral arteries: Tortuous proximal right subclavian artery with a mildly kinked appearance in the superior mediastinum. Normal right vertebral artery origin. Patent right vertebral artery to the skull base with no significant plaque or stenosis. Mild to moderate soft and calcified plaque in the proximal left subclavian artery with less than 50 % stenosis with respect to the distal vessel. Mild atherosclerosis and stenosis at the left vertebral artery origin on series 13, image 159. Non dominant left vertebral artery than is patent to the skull base with no additional plaque or stenosis. CTA HEAD Posterior circulation: Bilateral V4 segment calcified plaque. Severe stenosis on the left (series 12, image 139) upstream of the left PICA origin which remains patent. But the left V4 segment is occluded distal to the left PICA. Contralateral right V4 moderate tandem stenoses upstream of the patent right PICA origin on series  12, image 130. And severe stenosis, near occlusion of the right vertebrobasilar junction (series 16, image 26). The basilar artery remains patent but is moderately stenotic in the mid basilar segment on series 16, image 24. Distal basilar remains patent along with SCA and PCA origins. Left posterior communicating artery is present, the right is diminutive or absent. a mild to moderate stenosis left PCA origin. And bilateral P2 segment severe stenosis, near occlusion on series 15, image 53. But there is preserved distal PCA branch enhancement bilaterally. Anterior circulation: Both ICA siphons are patent. However, on the left there is bulky soft plaque or thrombus in the supraclinoid ICA (series 14, image 162) this is between normal appearing left ophthalmic and posterior communicating artery origins. Left carotid terminus remains patent. Contralateral right ICA siphon with mild to moderate  calcified plaque and mild supraclinoid stenosis. Patent carotid termini, MCA and ACA origins. Tortuous A1 segments. Normal anterior communicating artery. Proximal A2 segments are normal. There is severe right ACA distal A2 segment stenosis, near occlusion on series 17, image 32. Left MCA M1 segment is mildly irregular. Left MCA bifurcation is patent without stenosis. Left MCA branches are mildly irregular. Mild to moderate somewhat long segment right MCA M1 stenosis along 6 mm series 16, image 20. Distal M1 and right MCA trifurcation remain patent. Right MCA branches are mildly irregular. Venous sinuses: Early contrast timing, grossly patent. Anatomic variants: Mildly dominant right vertebral artery. Review of the MIP images confirms the above findings IMPRESSION: 1. 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 - Moderate stenosis Mid Basilar Artery. - Near Occlusion bilateral PCA P2 segments,  distal right ACA A2. - bulky soft plaque or thrombus in the Supraclinoid Left ICA, with up to Moderate stenosis. - mild-to-moderate Right MCA M1 stenosis. 2. Stable CT appearance of the brain since yesterday. No new intracranial abnormality. 3. Aortic Atherosclerosis (ICD10-I70.0) and Emphysema (ICD10-J43.9). Electronically Signed: By: Genevie Ann M.D. On: 08/03/2021 09:01   US Carotid Bilateral (at [facility] and [facility] only)  Result Date: 08/03/2021 CLINICAL DATA:  Follow-up examination for stroke. EXAM: BILATERAL CAROTID DUPLEX ULTRASOUND TECHNIQUE: Pearline Cables scale imaging, color Doppler and duplex ultrasound were performed of bilateral carotid and vertebral arteries in the neck. COMPARISON:  Prior Study from 04/03/2014. FINDINGS: Criteria: Quantification of carotid stenosis is based on velocity parameters that correlate the residual internal carotid diameter with NASCET-based stenosis levels, using the diameter of the distal internal carotid lumen as the denominator for stenosis measurement. The following velocity measurements were obtained: RIGHT ICA: 84/10 cm/sec CCA: 30/94 cm/sec SYSTOLIC ICA/CCA RATIO:  1.1 ECA: 64 cm/sec LEFT ICA: 64/16 cm/sec CCA: 076/80 cm/sec SYSTOLIC ICA/CCA RATIO:  0.9 ECA: 117 cm/sec RIGHT CAROTID ARTERY: Mild smooth intimal thickening seen within the visualized right CCA without significant stenosis. Mild heterogeneous mural based echogenic plaque seen about the right carotid bulb. No significant elevation in peak systolic velocity to suggest hemodynamically significant greater than 50% stenosis. Visualized right ICA patent distally without stenosis. RIGHT VERTEBRAL ARTERY:  Patent with antegrade flow. LEFT CAROTID ARTERY: Diffuse smooth intimal thickening seen within the visualized left CCA without significant stenosis. Mild scattered heterogeneous echogenic plaque about the left carotid bulb/proximal left ICA. No significant elevation in peak systolic velocity to suggest hemodynamically  significant greater than 50% stenosis. Visualized left ICA patent distally without visible stenosis. LEFT VERTEBRAL ARTERY:  Patent with antegrade flow. IMPRESSION: 1. Mild atherosclerotic plaque within the carotid bulbs and proximal ICAs bilaterally, but no hemodynamically significant greater than 50% stenosis. 2. Patent antegrade flow within both vertebral arteries. Electronically Signed   By: Jeannine Boga M.D.   On: 08/03/2021 03:26   MR Cervical Spine Wo Contrast  Result Date: 08/03/2021 CLINICAL DATA:  Initial evaluation for ataxia, right-sided numbness. EXAM: MRI CERVICAL SPINE WITHOUT CONTRAST TECHNIQUE: Multiplanar, multisequence MR imaging of the cervical spine was performed. No intravenous contrast was administered. COMPARISON:  None available. FINDINGS: Alignment: Examination degraded by motion artifact. Straightening with mild reversal of the normal cervical lordosis. No listhesis. Vertebrae: Vertebral body height maintained without acute or chronic fracture. Bone marrow signal intensity within normal limits. No discrete or worrisome osseous lesions or abnormal marrow edema. Cord: Normal signal and morphology. No convincing cord signal changes on this  motion degraded exam. Posterior Fossa, vertebral arteries, paraspinal tissues: Unremarkable. Disc levels: C2-C3: Unremarkable. C3-C4: Mild disc bulge with endplate and uncovertebral spurring. Flattening and partial effacement of the ventral thecal sac with resultant mild spinal stenosis. Moderate bilateral C4 foraminal narrowing. C4-C5: Mild disc bulge with uncovertebral spurring. Mild flattening of the ventral thecal sac without significant spinal stenosis. Moderate right with mild left C5 foraminal stenosis. C5-C6: Minimal disc bulge with uncovertebral spurring. No spinal stenosis. Mild right C6 foraminal narrowing. Left neural foramen remains patent. C6-C7: Minimal annular disc bulge. No spinal stenosis. Foramina remain patent. C7-T1:   Unremarkable. Visualized upper thoracic spine demonstrates no significant finding. IMPRESSION: 1. Motion degraded exam. 2. No acute abnormality within the cervical spine. 3. Mild multilevel cervical spondylosis with resultant mild spinal stenosis at C3-4. Associated moderate bilateral C4 foraminal stenosis, with mild right C5 and C6 foraminal narrowing. Electronically Signed   By: Jeannine Boga M.D.   On: 08/03/2021 01:04   MR BRAIN WO CONTRAST  Result Date: 08/03/2021 CLINICAL DATA:  Initial evaluation for neuro deficit, stroke suspected. EXAM: MRI HEAD WITHOUT CONTRAST TECHNIQUE: Multiplanar, multiecho pulse sequences of the brain and surrounding structures were obtained without intravenous contrast. COMPARISON:  Prior CT from earlier the same day as well as previous MRI from 09/17/2015. FINDINGS: Brain: Generalized age-related cerebral atrophy. Patchy T2/FLAIR hyperintensity involving the periventricular and deep white matter both cerebral hemispheres as well as the pons, most consistent with chronic small vessel ischemic disease, moderately advanced in nature. Area of encephalomalacia of involving parasagittal anterior/inferior right frontal lobe noted at site of prior hemorrhage. Additional smaller foci of encephalomalacia involving the inferior temporal poles bilaterally likely posttraumatic in nature. Few scattered small remote left cerebellar infarcts noted. Subtle area of diffusion signal abnormality involving the ventral medulla measuring up to approximately 8 mm is seen (a series 5, image 12). While this finding could potentially be artifactual, possibly due to prominent vascular calcifications about the adjacent vertebral arteries, a possible small acute ischemic infarct could be considered. No associated hemorrhage or mass effect. No other evidence for acute or subacute ischemia. No acute intracranial hemorrhage. Single chronic microhemorrhage noted within the right cerebellum, of doubtful  significance in isolation. No mass lesion, midline shift or mass effect. No hydrocephalus or extra-axial fluid collection. Pituitary gland and suprasellar region within normal limits. Vascular: Major intracranial vascular flow voids are maintained. Skull and upper cervical spine: Craniocervical junction within normal limits. Bone marrow signal intensity normal. No scalp soft tissue abnormality. Sinuses/Orbits: Globes and orbital soft tissues within normal limits. Paranasal sinuses are largely clear. No mastoid effusion. Other: None. IMPRESSION: 1. 8 mm focus of diffusion signal abnormality involving the ventral medulla, indeterminate. While this finding could potentially be artifactual in nature, a possible small acute ischemic infarct is difficult to exclude, and could be considered in the correct clinical setting. No associated hemorrhage or mass effect. 2. No other acute intracranial abnormality. 3. Underlying atrophy with moderate chronic small vessel ischemic disease with a few small remote left cerebellar infarcts. Area of encephalomalacia of involving the anterior/inferior right frontal lobe at site of previous hemorrhage. Electronically Signed   By: Jeannine Boga M.D.   On: 08/03/2021 00:59   CT Head Wo Contrast  Result Date: 08/02/2021 CLINICAL DATA:  New right upper extremity tremor. Tingling to the arm. EXAM: CT HEAD WITHOUT CONTRAST TECHNIQUE: Contiguous axial images were obtained from the base of the skull through the vertex without intravenous contrast. RADIATION DOSE REDUCTION: This exam was performed  according to the departmental dose-optimization program which includes automated exposure control, adjustment of the mA and/or kV according to patient size and/or use of iterative reconstruction technique. COMPARISON:  MRI brain 09/17/2015 FINDINGS: Brain: Mild cerebral atrophy. Mild ventricular dilatation consistent with central atrophy. Patchy low-attenuation changes in the deep white matter  consistent small vessel ischemic change. Old area of encephalomalacia in the right anterior frontal lobe corresponding to previous intraparenchymal hemorrhage on prior MRI. No acute mass effect or midline shift. No abnormal extra-axial fluid collections. Gray-white matter junctions are distinct. Basal cisterns are not effaced. No acute intracranial hemorrhage. Vascular: No hyperdense vessel or unexpected calcification. Skull: Normal. Negative for fracture or focal lesion. Sinuses/Orbits: Paranasal sinuses and mastoid air cells are clear. Other: None. IMPRESSION: No acute intracranial abnormalities. Chronic atrophy and small vessel ischemic changes. 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 Electronically Signed   By: Lucienne Capers M.D.   On: 08/02/2021 21:20   DG Foot Complete Right  Result Date: 08/02/2021 CLINICAL DATA:  Chronic RIGHT arm and leg pain for since Sunday, RIGHT leg cold to touch, swelling RIGHT great toe EXAM: RIGHT FOOT COMPLETE - 3+ VIEW COMPARISON:  None Available. FINDINGS: Osseous mineralization normal. 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 Remaining joint spaces preserved. No acute fracture, dislocation, or bone destruction. Small plantar calcaneal spur. IMPRESSION: Degenerative changes first MTP joint and small plantar calcaneal spur. No acute osseous abnormalities. Electronically Signed   By: Lavonia Dana M.D.   On: 08/02/2021 20:51    EKG: My personal interpretation of EKG shows: NSR    Assessment/Plan Principal Problem:   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HCC) Active Problems:   Hypertension   Uncontrolled type 2 diabetes mellitus with hyperglycemia (HCC)   Mixed hyperlipidemia   Alcohol abuse    Assessment and Plan: * 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[place]) Admit telemetry bed. Continue with permissive HTN until neurology consult says otherwise. NPO after MN. I have sent secure chat to neuro IR and stroke team. Defer to  stroke team for continued dosing of Brilinta and ASA. Per neurology notes, pt loaded with ASA and Brilinta at [facility] but did not leave any instructions for continued dosing.  Alcohol abuse Place on CIWA protocol.  Mixed hyperlipidemia Continue with lipitor 80 mg daily.  Uncontrolled type 2 diabetes mellitus with hyperglycemia (HCC) Uncontrolled. Aim for euglycemia. Add lantus and SSI.  Hypertension Continue to allow for permissive HTN until neurology consult says otherwise.   DVT prophylaxis: SCDs Code Status: Full Code Family Communication: no family at bedside  Disposition Plan: home vs CIR  Consults called: neurology notified of pt's arrival to [facility]  Admission status: Inpatient, Telemetry bed   Kristopher Oppenheim, DO [facility] 08/03/2021, 11:01 PM

## 2021-08-03 NOTE — Consult Note (Addendum)
ANTICOAGULATION CONSULT NOTE - Initial Consult  Pharmacy Consult for heparin infusion - no bolus Indication: stroke  Allergies  Allergen Reactions   Lisinopril Swelling   Ace Inhibitors Swelling   Anchovies [Fish Allergy] Swelling   Sulfa Antibiotics Swelling    Patient Measurements: Height: '5\' 7"'$  (170.2 cm) Weight: 83.9 kg (185 lb) IBW/kg (Calculated) : 66.1 Heparin Dosing Weight: 83 kg  Vital Signs: Temp: 98.2 F (36.8 C) (07/25 0855) Temp Source: Oral (07/25 0855) BP: 188/109 (07/25 0855) Pulse Rate: 79 (07/25 0855)  Labs: Recent Labs    08/02/21 2027 08/03/21 0347  HGB 14.2  --   HCT 43.0  --   PLT 296  --   CREATININE 0.70 0.76    Estimated Creatinine Clearance: 94 mL/min (by C-G formula based on SCr of 0.76 mg/dL).   Medical History: Past Medical History:  Diagnosis Date   Diabetes mellitus without complication ([place])    GERD (gastroesophageal reflux disease)    Hemorrhage in the brain ([place]) 08/2015   High cholesterol    Hyperlipidemia    Hypertension     Medications:  PTA:  Inpatient:  Allergies: No AC/APT related allergies  Assessment: 23mhx htn, dm (both uncontrolled) and alcohol abuse, presenting with RLE weakness for 2 days that has progressed to involve right arm as well, found to have ventral medulla infarct, severe hypertension. CTA shows severe intracranial atherosclerosis. Pharmacy consulted for management of heparin infusion in the setting of stroke.   Goal of Therapy:  Heparin level 0.3 - 0.5 units/ml Monitor platelets by anticoagulation protocol: Yes  Date Time aPTT/HL Rate/Comment   Plan:  Stat aPTT x1 No bolus per protocol and instruction from Neuro Start heparin infusion at 1000 units/hr Check anti-Xa level in 6 hours and daily once consecutively therapeutic. Continue to monitor H&H and platelets daily while on heparin gtt.  ADarrick Penna7/25/2023,10:46 AM

## 2021-08-03 NOTE — Progress Notes (Signed)
Daughter would like to be called and notified as soon as patient gets a bed assigned at [facility].   [phone]

## 2021-08-03 NOTE — Consult Note (Signed)
ANTICOAGULATION CONSULT NOTE - Initial Consult  Pharmacy Consult for heparin infusion - no bolus Indication: stroke  Allergies  Allergen Reactions   Lisinopril Swelling   Ace Inhibitors Swelling   Anchovies [Fish Allergy] Swelling   Sulfa Antibiotics Swelling    Patient Measurements: Height: '5\' 7"'$  (170.2 cm) Weight: 83.9 kg (185 lb) IBW/kg (Calculated) : 66.1 Heparin Dosing Weight: 83 kg  Vital Signs: Temp: 98.8 F (37.1 C) (07/25 1315) Temp Source: Oral (07/25 1315) BP: 159/92 (07/25 1700) Pulse Rate: 74 (07/25 1700)  Labs: Recent Labs    08/02/21 2027 08/03/21 0347 08/03/21 1129 08/03/21 1809  HGB 14.2  --   --   --   HCT 43.0  --   --   --   PLT 296  --   --   --   APTT  --   --  28  --   HEPARINUNFRC  --   --   --  0.16*  CREATININE 0.70 0.76  --   --      Estimated Creatinine Clearance: 94 mL/min (by C-G formula based on SCr of 0.76 mg/dL).   Medical History: Past Medical History:  Diagnosis Date   Diabetes mellitus without complication ([place])    GERD (gastroesophageal reflux disease)    Hemorrhage in the brain ([place]) 08/2015   High cholesterol    Hyperlipidemia    Hypertension     Medications:  PTA:  Inpatient:  Allergies: No AC/APT related allergies  Assessment: 76mhx htn, dm (both uncontrolled) and alcohol abuse, presenting with RLE weakness for 2 days that has progressed to involve right arm as well, found to have ventral medulla infarct, severe hypertension. CTA shows severe intracranial atherosclerosis. Pharmacy consulted for management of heparin infusion in the setting of stroke.   Goal of Therapy:  Heparin level 0.3 - 0.5 units/ml Monitor platelets by anticoagulation protocol: Yes  Date Time aPTT/HL Rate/Comment 7/25 1809 HL= 0.16 Subtherapeutic 1000 > 1150 units/hr  Plan:  Heparin subtherapeutic Increase heparin infusion to 1150 units/hr Check anti-Xa level in 6 hours following rate change and daily once consecutively  therapeutic. Continue to monitor H&H and platelets daily while on heparin gtt.  CDorothe Pea PharmD, BCPS Clinical Pharmacist   08/03/2021,6:42 PM

## 2021-08-03 NOTE — Progress Notes (Signed)
   08/03/21 1214  NIH Stroke Scale ( + Modified Stroke Scale Criteria)   Interval Shift assessment  Level of Consciousness (1a.)    0  LOC Questions (1b. )   + 0  LOC Commands (1c. )   +  0  Best Gaze (2. )  + 0  Visual (3. )  + 0  Facial Palsy (4. )     0  Motor Arm, Left (5a. )   + 0  Motor Arm, Right (5b. )   + 4  Motor Leg, Left (6a. )   + 0  Motor Leg, Right (6b. )   + 4  Limb Ataxia (7. ) 1  Sensory (8. )   + 1  Best Language (9. )   + 1  Dysarthria (10. ) 1  Extinction/Inattention (11.)   + 0  Modified SS Total  + 10  Complete NIHSS TOTAL 12   MD aware

## 2021-08-03 NOTE — Assessment & Plan Note (Addendum)
Neurology consulted. Patient initially loaded with aspirin and Brilinta. Cerebral angiogram performed on 7/26 with placement of Nicol Herbig stent of right vertebro-basilar junction. 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

## 2021-08-03 NOTE — Consult Note (Signed)
Neurology Consultation  Reason for Consult: Right-sided weakness Referring Physician: Dr. Damita Dunnings  CC: Weakness-right-sided  History is obtained from: Patient, chart, neighbor  HPI: Joshua Donovan is a 65 y.o. male past history of traumatic brain injury with ICH, diabetes, hypertension, hyperlipidemia, presenting to the emergency room with complaints of 2 days whether right leg pain and weakness.  He started having some trouble with his right leg sometime on Saturday, 07/31/2021 and on Sunday, one of the neighbors saw him being slow and somewhat off balance while walking.  He was brought into the hospital yesterday, MRI of the brain was done that showed a small brainstem stroke and he was admitted for further stroke work-up. His weakness is continued to worsen now involving the left leg and worsening weakness on the right side now with right arm also being affected. CT angio head and neck-see details below has multiple intracranial severe stenosis.    LKW: Sometime Saturday, 07/31/2021 IV thrombolysis given?: no, outside the window Premorbid modified Rankin scale (mRS):0   ROS: Full ROS was performed and is negative except as noted in the HPI.   Past Medical History:  Diagnosis Date   Diabetes mellitus without complication (HCC)    GERD (gastroesophageal reflux disease)    Hemorrhage in the brain ([place]) 08/2015   High cholesterol    Hyperlipidemia    Hypertension      Family History  Problem Relation Age of Onset   Hypertension Father    Diabetes Sister      Social History:   reports that he quit smoking about 33 years ago. He has never used smokeless tobacco. He reports current alcohol use of about 7.0 standard drinks of alcohol per week. He reports that he does not use drugs.  Medications  Current Facility-Administered Medications:    [START ON 08/04/2021]  stroke: early stages of recovery book, , Does not apply, Once, Athena Masse, MD   acetaminophen (TYLENOL) tablet 650  mg, 650 mg, Oral, Q4H PRN **OR** acetaminophen (TYLENOL) 160 MG/5ML solution 650 mg, 650 mg, Per Tube, Q4H PRN **OR** acetaminophen (TYLENOL) suppository 650 mg, 650 mg, Rectal, Q4H PRN, Athena Masse, MD   atorvastatin (LIPITOR) tablet 80 mg, 80 mg, Oral, Daily, Wouk, Ailene Rud, MD, 80 mg at 08/03/21 0908   enoxaparin (LOVENOX) injection 40 mg, 40 mg, Subcutaneous, Q24H, Athena Masse, MD   folic acid (FOLVITE) tablet 1 mg, 1 mg, Oral, Daily, Wouk, Ailene Rud, MD, 1 mg at 08/03/21 0909   insulin aspart (novoLOG) injection 0-15 Units, 0-15 Units, Subcutaneous, TID WC, Athena Masse, MD, 5 Units at 08/03/21 0915   insulin aspart (novoLOG) injection 0-5 Units, 0-5 Units, Subcutaneous, QHS, Athena Masse, MD, 2 Units at 08/03/21 0101   insulin glargine-yfgn (SEMGLEE) injection 5 Units, 5 Units, Subcutaneous, Daily, Wouk, Ailene Rud, MD   LORazepam (ATIVAN) tablet 1-4 mg, 1-4 mg, Oral, Q1H PRN, 1 mg at 08/03/21 0908 **OR** LORazepam (ATIVAN) injection 1-4 mg, 1-4 mg, Intravenous, Q1H PRN, Wouk, Ailene Rud, MD   metoprolol tartrate (LOPRESSOR) tablet 25 mg, 25 mg, Oral, Daily, Judd Gaudier V, MD, 25 mg at 08/03/21 0908   multivitamin with minerals tablet 1 tablet, 1 tablet, Oral, Daily, Wouk, Ailene Rud, MD, 1 tablet at 08/03/21 1610   thiamine tablet 100 mg, 100 mg, Oral, Daily, 100 mg at 08/03/21 0908 **OR** thiamine (B-1) injection 100 mg, 100 mg, Intravenous, Daily, Wouk, Ailene Rud, MD  Current Outpatient Medications:    ACCU-CHEK FASTCLIX LANCETS MISC,  Use as directed twice daily dia E11.65, Disp: 100 each, Rfl: 1   amLODipine (NORVASC) 10 MG tablet, Take 1 tablet (10 mg total) by mouth daily., Disp: 90 tablet, Rfl: 1   atorvastatin (LIPITOR) 20 MG tablet, Take 20 mg by mouth daily., Disp: , Rfl:    atorvastatin (LIPITOR) 40 MG tablet, Take 1 tablet by mouth once daily (Patient not taking: Reported on 08/03/2021), Disp: 30 tablet, Rfl: 0   Blood Glucose Monitoring Suppl  (ACCU-CHEK AVIVA PLUS) w/Device KIT, Use as directed, Disp: 1 kit, Rfl: 0   cetirizine (ZYRTEC) 10 MG tablet, Take 1 tablet (10 mg total) by mouth daily., Disp: 90 tablet, Rfl: 1   cloNIDine (CATAPRES) 0.1 MG tablet, Take 1 tablet (0.1 mg total) by mouth 2 (two) times daily., Disp: 90 tablet, Rfl: 1   glucose blood (ACCU-CHEK AVIVA PLUS) test strip, Use two times daily to check blood sugar, Disp: 100 each, Rfl: 3   hydrochlorothiazide (MICROZIDE) 12.5 MG capsule, Take 2 capsules by mouth once daily, Disp: 30 capsule, Rfl: 0   losartan (COZAAR) 100 MG tablet, Take one tablet by mouth daily, Disp: 90 tablet, Rfl: 0   metFORMIN (GLUCOPHAGE) 500 MG tablet, Take 2 tablets by mouth twice daily, Disp: 90 tablet, Rfl: 0   metFORMIN (GLUCOPHAGE) 500 MG tablet, Take 2 tablets (1,000 mg total) by mouth 2 (two) times daily., Disp: 90 tablet, Rfl: 3   metoprolol tartrate (LOPRESSOR) 25 MG tablet, Take 1 tablet (25 mg total) by mouth daily., Disp: 90 tablet, Rfl: 0   pneumococcal 23 valent vaccine (PNEUMOVAX-23) 25 MCG/0.5ML injection, Inject 0.5 mLs into the muscle tomorrow at 10 am., Disp: , Rfl:    sitaGLIPtin (JANUVIA) 100 MG tablet, Take 1 tablet (100 mg total) by mouth daily., Disp: 90 tablet, Rfl: 0   tadalafil (CIALIS) 5 MG tablet, Take 1 tablet (5 mg total) by mouth daily., Disp: 5 tablet, Rfl: 0   Exam: Current vital signs: BP (!) 188/109   Pulse 79   Temp 98.2 F (36.8 C) (Oral)   Resp 19   Ht 5' 7"  (1.702 m)   Wt 83.9 kg   SpO2 95%   BMI 28.98 kg/m  Vital signs in last 24 hours: Temp:  [98.2 F (36.8 C)-98.3 F (36.8 C)] 98.2 F (36.8 C) (07/25 0855) Pulse Rate:  [65-79] 79 (07/25 0855) Resp:  [10-19] 19 (07/25 0855) BP: (142-219)/(79-109) 188/109 (07/25 0855) SpO2:  [94 %-100 %] 95 % (07/25 0855) Weight:  [83.9 kg] 83.9 kg (07/24 2023) General: Awake alert no distress HEENT: Normocephalic atraumatic Lungs: Clear Cardiovascular: Regular rhythm Abdomen nondistended  nontender Extremities warm well perfused Neurological exam He is awake alert oriented x3 Speech is moderately dysarthric No evidence of aphasia Cranial nerves: Pupils: Reactive light, extract movements intact, visual fields full, face appears symmetric, facial sensation intact, tongue and palate midline, shoulder shrug weak on the right. Motor examination with flaccid right upper and lower extremity.  Also has barely 1/5 strength in the left lower extremity.  Left upper extremities.. Sensation intact Coordination exam difficult performed NIH stroke scale 1a Level of Conscious.: 0 1b LOC Questions: 0 1c LOC Commands: 0 2 Best Gaze: 0 3 Visual: 0 4 Facial Palsy: 0 5a Motor Arm - left: 0 5b Motor Arm - Right: 4 6a Motor Leg - Left: 3 6b Motor Leg - Right: 3 7 Limb Ataxia: 0 8 Sensory: 0 9 Best Language: 0 10 Dysarthria: 2 11 Extinct. and Inatten.: 0 TOTAL: 12  Labs I have reviewed labs in epic and the results pertinent to this consultation are:  CBC    Component Value Date/Time   WBC 6.4 08/02/2021 2027   RBC 5.03 08/02/2021 2027   HGB 14.2 08/02/2021 2027   HCT 43.0 08/02/2021 2027   PLT 296 08/02/2021 2027   MCV 85.5 08/02/2021 2027   MCH 28.2 08/02/2021 2027   MCHC 33.0 08/02/2021 2027   RDW 14.0 08/02/2021 2027   LYMPHSABS 1.5 08/02/2021 2027   MONOABS 0.5 08/02/2021 2027   EOSABS 0.1 08/02/2021 2027   BASOSABS 0.1 08/02/2021 2027    CMP     Component Value Date/Time   NA 136 08/03/2021 0347   K 3.4 (L) 08/03/2021 0347   CL 101 08/03/2021 0347   CO2 24 08/03/2021 0347   GLUCOSE 212 (H) 08/03/2021 0347   BUN 11 08/03/2021 0347   CREATININE 0.76 08/03/2021 0347   CALCIUM 9.0 08/03/2021 0347   PROT 7.3 08/02/2021 2027   ALBUMIN 4.2 08/02/2021 2027   AST 26 08/02/2021 2027   ALT 28 08/02/2021 2027   ALKPHOS 29 (L) 08/02/2021 2027   BILITOT 0.5 08/02/2021 2027   GFRNONAA >60 08/03/2021 0347   GFRAA >60 07/20/2015 0335    Lipid Panel     Component  Value Date/Time   CHOL 203 (H) 08/03/2021 0347   TRIG 175 (H) 08/03/2021 0347   HDL 52 08/03/2021 0347   CHOLHDL 3.9 08/03/2021 0347   VLDL 35 08/03/2021 0347   LDLCALC 116 (H) 08/03/2021 0347   LDL 160 A1c 10.8 in February 2022.  Imaging I have reviewed the images obtained: MRI of the brain with an 8 mm focus of DWI signal abnormality in the ventral medulla.  Underlying atrophy.  Chronic small vessel disease.  Area of encephalomalacia in the anterior inferior right frontal lobe at the site of previous hemorrhage. CT angiography head and neck negative for bona fide large vessel occlusion but positive for severe intracranial atherosclerosis with: -Near occlusion of the VBJ with occluded distal left V4 - Moderate stenosis of the mid basilar artery - Near occlusion of bilateral PCA P2 segments, distal right ACA A2 segments. - Bulky soft plaque or thrombus in the supraclinoid left ICA with up to moderate stenosis. - Mild to moderate right MCA M1 stenosis.  Assessment:  66 year old with above past medical history presenting for evaluation of right-sided weakness with last known well Saturday, and while in the hospital has experienced worsening of the right-sided weakness and also now weakness involving the left leg. He is not a candidate for any acute thrombolysis or acute endovascular thrombectomy due to being outside the window, and MRI last night showing an established stroke in the ventral medulla. His head and neck vessel imaging show no significant disease in the neck but his intracerebral vasculature has near occlusive stenosis of the vertebrobasilar junction, occlusion of the distal left V4, moderate mid basilar stenosis, nearly occlusive bilateral PCA P2 segments, nearly occlusive distal right ACA A2 segments and bulky soft plaque or thrombus in the supraclinoid left ICA with up to moderate stenosis along with mild to moderate right M1 stenosis. I would not be surprised if he has  extended his stroke to another area other than what the MRI had shown.  Repeat MRI would be helpful He definitely requires diagnostic cerebral angiogram for further characterization of the near occlusion/severe stenosis and plan on revascularization. Question if some of them are actually subocclusive thrombi and not atherosclerosis.  Impression: Acute  ischemic stroke-likely secondary to intracranial atherosclerosis Multifocal intracranial atherosclerosis versus intraluminal thrombi   Recommendations: Continue frequent neurochecks Continue telemetry Repeat MRI stat Start heparin drip stroke protocol 2D echo A1c pending-goal less than 7. Hold antiplatelets for now-decision after stroke team rounding at [facility]. PT OT Speech therapy N.p.o. until cleared by bedside swallow or formal swallow evaluation.  He had been cleared by the bedside screen before but has been having worsening dysphagia over the last few hours, hence NPO.  Hospitalist to arrange for transfer to [facility] hospitalist service.  Discussed the case with the stroke attending at [facility], who will be following the patient in consultation.  Also discussed with the neuro interventional radiology specialist, who will discuss with one of his other colleagues to plan a diagnostic cerebral angiogram and possible basilar stenting/revascularization.  Plan was relayed to Dr. Si Raider -- Amie Portland, MD Neurologist [facility] Pager: [phone]  CRITICAL CARE ATTESTATION Performed by: Amie Portland, MD Total critical care time: 60 minutes Critical care time was exclusive of separately billable procedures and treating other patients and/or supervising APPs/Residents/Students Critical care was necessary to treat or prevent imminent or life-threatening deterioration due to acute ischemic stroke, severe intracranial atherosclerosis This patient is critically ill and at significant risk for  neurological worsening and/or death and care requires constant monitoring. Critical care was time spent personally by me on the following activities: development of treatment plan with patient and/or surrogate as well as nursing, discussions with consultants, evaluation of patient's response to treatment, examination of patient, obtaining history from patient or surrogate, ordering and performing treatments and interventions, ordering and review of laboratory studies, ordering and review of radiographic studies, pulse oximetry, re-evaluation of patient's condition, participation in multidisciplinary rounds and medical decision making of high complexity in the care of this patient.

## 2021-08-03 NOTE — Anesthesia Preprocedure Evaluation (Signed)
Anesthesia Evaluation  Patient identified by MRN, date of birth, ID band Patient awake    Reviewed: Allergy & Precautions, NPO status , Patient's Chart, lab work & pertinent test results  History of Anesthesia Complications Negative for: history of anesthetic complications  Airway Mallampati: II  TM Distance: >3 FB Neck ROM: Full    Dental  (+) Dental Advisory Given   Pulmonary neg pulmonary ROS, former smoker,    Pulmonary exam normal        Cardiovascular hypertension, Normal cardiovascular exam     Neuro/Psych Multiple intracranial vessel stenoses CVA, Residual Symptoms    GI/Hepatic GERD  ,(+)     substance abuse  alcohol use,   Endo/Other  diabetes (A1c 8.3), Poorly Controlled, Type 2  Renal/GU negative Renal ROS  negative genitourinary   Musculoskeletal negative musculoskeletal ROS (+)   Abdominal   Peds  Hematology negative hematology ROS (+)   Anesthesia Other Findings   Reproductive/Obstetrics                           Anesthesia Physical Anesthesia Plan  ASA: 4  Anesthesia Plan: General   Post-op Pain Management: Minimal or no pain anticipated and Tylenol PO (pre-op)*   Induction: Intravenous  PONV Risk Score and Plan: 2 and Ondansetron, Dexamethasone, Treatment may vary due to age or medical condition and Midazolam  Airway Management Planned: Oral ETT  Additional Equipment: Arterial line  Intra-op Plan:   Post-operative Plan: Extubation in OR  Informed Consent: I have reviewed the patients History and Physical, chart, labs and discussed the procedure including the risks, benefits and alternatives for the proposed anesthesia with the patient or authorized representative who has indicated his/her understanding and acceptance.     Dental advisory given  Plan Discussed with:   Anesthesia Plan Comments:        Anesthesia Quick Evaluation

## 2021-08-03 NOTE — Assessment & Plan Note (Signed)
Acute CVA Permissive hypertension for first 24-48 hrs post stroke onset: Prn Labetalol IV or Vasotec IV If BP greater than 220/120  Statins for LDL goal less than 70 ASA '81mg'$  daily, Telemetry, echo and carotid Doppler Follow-up MRI:  mm focus of diffusion signal abnormality involving the ventral medulla, indeterminate. While this finding could potentially be artifactual in nature, a possible small acute ischemic infarct is difficult to exclude, and could be considered in the correct clinical setting. No associated hemorrhage or mass effect. Avoid dextrose containing fluids, Maintain euglycemia, euthermia Neuro checks q4 hrs x 24 hrs and then per shift Head of bed 30 degrees Physical therapy/Occupational therapy/Speech therapy if failed dysphagia screen Neurology consult to follow

## 2021-08-03 NOTE — H&P (Signed)
History and Physical    Patient: Joshua Donovan ZGY:[id number] DOB: 1955-07-08 DOA: 08/02/2021 DOS: the patient was seen and examined on 08/03/2021 PCP: [facility] [place]  Patient coming from: Home  Chief Complaint:  Chief Complaint  Patient presents with   Leg Pain   Arm Pain    HPI: Joshua Donovan is a 66 y.o. male with medical history significant for DM, HTN, who presented initially to the ED for pain in the right foot at the base of his great toe but who on work-up reveals that for the past 2 days has been having weakness of the right arm and has been dragging the right leg with difficulty ambulating ED course and data review: BP 219/96 with otherwise normal vitals Labs including CBC and CMP unremarkable except for potassium of 3.1.  Uric acid normal at 5.2.  Urinalysis unremarkable EKG, personally viewed and interpreted showing sinus at 72 with nonspecific ST-T wave changes CT head with no acute intracranial abnormalities.  Chronic atrophy and small vessel ischemic changes and old right frontal encephalomalacia Right foot x-ray showing degenerative changes of first MTP joint and no acute osseous abnormalities MRI brain and C-spine pending  The ED provider spoke with on-call neurologist, Dr. Cheral Marker who recommended admission for stroke work-up Patient was given clonidine 0.2 mg for blood pressure.  Hospitalist consulted for admission.   MRI brain resulted postadmission shows the following IMPRESSION: 1. 8 mm focus of diffusion signal abnormality involving the ventral medulla, indeterminate. While this finding could potentially be artifactual in nature, a possible small acute ischemic infarct is difficult to exclude, and could be considered in the correct clinical setting. No associated hemorrhage or mass effect. 2. No other acute intracranial abnormality. 3. Underlying atrophy with moderate chronic small vessel ischemic disease with a few small remote left  cerebellar infarcts. Area of encephalomalacia of involving the anterior/inferior right frontal lobe at site of previous hemorrhage  MRI C-spine showed the following IMPRESSION: 1. Motion degraded exam. 2. No acute abnormality within the cervical spine. 3. Mild multilevel cervical spondylosis with resultant mild spinal stenosis at C3-4. Associated moderate bilateral C4 foraminal stenosis, with mild right C5 and C6 foraminal narrowing.  Past Medical History:  Diagnosis Date   Diabetes mellitus without complication (HCC)    GERD (gastroesophageal reflux disease)    Hemorrhage in the brain ([place]) 08/2015   High cholesterol    Hyperlipidemia    Hypertension    Past Surgical History:  Procedure Laterality Date   COLONOSCOPY WITH PROPOFOL N/A 09/08/2017   Procedure: COLONOSCOPY WITH PROPOFOL;  Surgeon: Lucilla Lame, MD;  Location: [place];  Service: Endoscopy;  Laterality: N/A;  Diabetic - oral meds   partial intestine removed     approx date: 1980   POLYPECTOMY  09/08/2017   Procedure: POLYPECTOMY;  Surgeon: Lucilla Lame, MD;  Location: [facility];  Service: Endoscopy;;   Social History:  reports that he quit smoking about 33 years ago. He has never used smokeless tobacco. He reports current alcohol use of about 7.0 standard drinks of alcohol per week. He reports that he does not use drugs.  Allergies  Allergen Reactions   Lisinopril Swelling   Ace Inhibitors Swelling   Anchovies [Fish Allergy] Swelling   Sulfa Antibiotics Swelling    Family History  Problem Relation Age of Onset   Hypertension Father    Diabetes Sister     Prior to Admission medications   Medication Sig Start Date End Date Taking? Authorizing  Provider  ACCU-CHEK FASTCLIX LANCETS MISC Use as directed twice daily dia E11.65 08/03/17   Lavera Guise, MD  amLODipine (NORVASC) 10 MG tablet Take 1 tablet (10 mg total) by mouth daily. 02/20/20   Lavera Guise, MD  atorvastatin (LIPITOR) 40 MG  tablet Take 1 tablet by mouth once daily 07/06/20   Lavera Guise, MD  Blood Glucose Monitoring Suppl (ACCU-CHEK AVIVA PLUS) w/Device KIT Use as directed 10/04/17   Ronnell Freshwater, NP  cetirizine (ZYRTEC) 10 MG tablet Take 1 tablet (10 mg total) by mouth daily. 02/20/20   Lavera Guise, MD  cloNIDine (CATAPRES) 0.1 MG tablet Take 1 tablet (0.1 mg total) by mouth 2 (two) times daily. 02/20/20   Lavera Guise, MD  glucose blood (ACCU-CHEK AVIVA PLUS) test strip Use two times daily to check blood sugar 10/05/17   Ronnell Freshwater, NP  hydrochlorothiazide (MICROZIDE) 12.5 MG capsule Take 2 capsules by mouth once daily 04/17/20   Lavera Guise, MD  losartan (COZAAR) 100 MG tablet Take one tablet by mouth daily 02/20/20   Lavera Guise, MD  metFORMIN (GLUCOPHAGE) 500 MG tablet Take 2 tablets by mouth twice daily 04/17/20   Lavera Guise, MD  metFORMIN (GLUCOPHAGE) 500 MG tablet Take 2 tablets (1,000 mg total) by mouth 2 (two) times daily. 04/17/20   Lavera Guise, MD  metoprolol tartrate (LOPRESSOR) 25 MG tablet Take 1 tablet (25 mg total) by mouth daily. 02/20/20   Lavera Guise, MD  pneumococcal 23 valent vaccine (PNEUMOVAX-23) 25 MCG/0.5ML injection Inject 0.5 mLs into the muscle tomorrow at 10 am.    [provider]  sitaGLIPtin (JANUVIA) 100 MG tablet Take 1 tablet (100 mg total) by mouth daily. 02/20/20   Lavera Guise, MD  tadalafil (CIALIS) 5 MG tablet Take 1 tablet (5 mg total) by mouth daily. 02/20/20   Lavera Guise, MD    Physical Exam: Vitals:   08/02/21 2010 08/02/21 2023 08/02/21 2251  BP: (!) 219/96  (!) 189/80  Pulse: 72  75  Resp: 16  19  Temp: 98.3 F (36.8 C)  98.3 F (36.8 C)  TempSrc: Oral    SpO2: 98%  100%  Weight:  83.9 kg   Height:  5' 7"  (1.702 m)    Physical Exam Vitals and nursing note reviewed.  Constitutional:      General: He is not in acute distress. HENT:     Head: Normocephalic and atraumatic.  Cardiovascular:     Rate and Rhythm: Normal rate and regular  rhythm.     Heart sounds: Normal heart sounds.  Pulmonary:     Effort: Pulmonary effort is normal.     Breath sounds: Normal breath sounds.  Abdominal:     Palpations: Abdomen is soft.     Tenderness: There is no abdominal tenderness.  Neurological:     Mental Status: Mental status is at baseline.     Motor: Weakness present.     Comments: Right-sided weakness     Labs on Admission: I have personally reviewed following labs and imaging studies  CBC: Recent Labs  Lab 08/02/21 2027  WBC 6.4  NEUTROABS 4.3  HGB 14.2  HCT 43.0  MCV 85.5  PLT 960   Basic Metabolic Panel: Recent Labs  Lab 08/02/21 2027  NA 136  K 3.1*  CL 101  CO2 25  GLUCOSE 182*  BUN 14  CREATININE 0.70  CALCIUM 9.9   GFR: Estimated Creatinine  Clearance: 94 mL/min (by C-G formula based on SCr of 0.7 mg/dL). Liver Function Tests: Recent Labs  Lab 08/02/21 2027  AST 26  ALT 28  ALKPHOS 29*  BILITOT 0.5  PROT 7.3  ALBUMIN 4.2   No results for input(s): "LIPASE", "AMYLASE" in the last 168 hours. No results for input(s): "AMMONIA" in the last 168 hours. Coagulation Profile: No results for input(s): "INR", "PROTIME" in the last 168 hours. Cardiac Enzymes: No results for input(s): "CKTOTAL", "CKMB", "CKMBINDEX", "TROPONINI" in the last 168 hours. BNP (last 3 results) No results for input(s): "PROBNP" in the last 8760 hours. HbA1C: No results for input(s): "HGBA1C" in the last 72 hours. CBG: No results for input(s): "GLUCAP" in the last 168 hours. Lipid Profile: No results for input(s): "CHOL", "HDL", "LDLCALC", "TRIG", "CHOLHDL", "LDLDIRECT" in the last 72 hours. Thyroid Function Tests: No results for input(s): "TSH", "T4TOTAL", "FREET4", "T3FREE", "THYROIDAB" in the last 72 hours. Anemia Panel: No results for input(s): "VITAMINB12", "FOLATE", "FERRITIN", "TIBC", "IRON", "RETICCTPCT" in the last 72 hours. Urine analysis:    Component Value Date/Time   COLORURINE YELLOW (A) 08/02/2021  2027   APPEARANCEUR HAZY (A) 08/02/2021 2027   APPEARANCEUR Clear 01/22/2019 0000   LABSPEC 1.018 08/02/2021 2027   PHURINE 6.0 08/02/2021 2027   GLUCOSEU 50 (A) 08/02/2021 2027   HGBUR NEGATIVE 08/02/2021 2027   BILIRUBINUR NEGATIVE 08/02/2021 2027   BILIRUBINUR Negative 01/22/2019 0000   KETONESUR NEGATIVE 08/02/2021 2027   PROTEINUR NEGATIVE 08/02/2021 2027   NITRITE NEGATIVE 08/02/2021 2027   LEUKOCYTESUR NEGATIVE 08/02/2021 2027    Radiological Exams on Admission: CT Head Wo Contrast  Result Date: 08/02/2021 CLINICAL DATA:  New right upper extremity tremor. Tingling to the arm. EXAM: CT HEAD WITHOUT CONTRAST TECHNIQUE: Contiguous axial images were obtained from the base of the skull through the vertex without intravenous contrast. RADIATION DOSE REDUCTION: This exam was performed according to the departmental dose-optimization program which includes automated exposure control, adjustment of the mA and/or kV according to patient size and/or use of iterative reconstruction technique. COMPARISON:  MRI brain 09/17/2015 FINDINGS: Brain: Mild cerebral atrophy. Mild ventricular dilatation consistent with central atrophy. Patchy low-attenuation changes in the deep white matter consistent small vessel ischemic change. Old area of encephalomalacia in the right anterior frontal lobe corresponding to previous intraparenchymal hemorrhage on prior MRI. No acute mass effect or midline shift. No abnormal extra-axial fluid collections. Gray-white matter junctions are distinct. Basal cisterns are not effaced. No acute intracranial hemorrhage. Vascular: No hyperdense vessel or unexpected calcification. Skull: Normal. Negative for fracture or focal lesion. Sinuses/Orbits: Paranasal sinuses and mastoid air cells are clear. Other: None. IMPRESSION: No acute intracranial abnormalities. Chronic atrophy and small vessel ischemic changes. 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 Electronically Signed   By: Lucienne Capers  M.D.   On: 08/02/2021 21:20   DG Foot Complete Right  Result Date: 08/02/2021 CLINICAL DATA:  Chronic RIGHT arm and leg pain for since Sunday, RIGHT leg cold to touch, swelling RIGHT great toe EXAM: RIGHT FOOT COMPLETE - 3+ VIEW COMPARISON:  None Available. FINDINGS: Osseous mineralization normal. 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 Remaining joint spaces preserved. No acute fracture, dislocation, or bone destruction. Small plantar calcaneal spur. IMPRESSION: Degenerative changes first MTP joint and small plantar calcaneal spur. No acute osseous abnormalities. Electronically Signed   By: Lavonia Dana M.D.   On: 08/02/2021 20:51     Data Reviewed: Relevant notes from primary care and specialist visits, past  discharge summaries as available in EHR, including Care Everywhere. Prior diagnostic testing as pertinent to current admission diagnoses Updated medications and problem lists for reconciliation ED course, including vitals, labs, imaging, treatment and response to treatment Triage notes, nursing and pharmacy notes and ED provider's notes Notable results as noted in HPI   Assessment and Plan: * Acute focal neurological deficit Acute CVA Permissive hypertension for first 24-48 hrs post stroke onset: Prn Labetalol IV or Vasotec IV If BP greater than 220/120  Statins for LDL goal less than 70 ASA 81m daily, Telemetry, echo and carotid Doppler Follow-up MRI:  mm focus of diffusion signal abnormality involving the ventral medulla, indeterminate. While this finding could potentially be artifactual in nature, a possible small acute ischemic infarct is difficult to exclude, and could be considered in the correct clinical setting. No associated hemorrhage or mass effect. Avoid dextrose containing fluids, Maintain euglycemia, euthermia Neuro checks q4 hrs x 24 hrs and then per shift Head of bed 30 degrees Physical therapy/Occupational  therapy/Speech therapy if failed dysphagia screen Neurology consult to follow   Right foot pain Right MTP joint pain uric acid normal but could be gout Tylenol, NSAIDs as needed  Hypertensive emergency BP on arrival 219/96 which could be causing neurologic symptoms and ataxia Given 2 days out from neurologic symptoms will start controlling blood pressure to goal SBP under 130 Patient given clonidine in the ED Resume home losartan and metoprolol  Hypokalemia Oral repletion.  Monitor and replete as necessary  Diabetes mellitus without complication (HCC) Sliding scale insulin coverage        DVT prophylaxis: Lovenox  Consults: Neurology, Dr. AMalen Gauze Advance Care Planning:   Code Status: Prior   Family Communication: none  Disposition Plan: Back to previous home environment  Severity of Illness: The appropriate patient status for this patient is OBSERVATION. Observation status is judged to be reasonable and necessary in order to provide the required intensity of service to ensure the patient's safety. The patient's presenting symptoms, physical exam findings, and initial radiographic and laboratory data in the context of their medical condition is felt to place them at decreased risk for further clinical deterioration. Furthermore, it is anticipated that the patient will be medically stable for discharge from the hospital within 2 midnights of admission.   Author: HAthena Masse MD 08/03/2021 12:13 AM  For on call review [email or web address]

## 2021-08-03 NOTE — Assessment & Plan Note (Addendum)
Previously with hypertensive emergency which has now resolved. -Continue clonidine, amlodipine, hydrochlorothiazide and Coreg

## 2021-08-03 NOTE — Progress Notes (Signed)
Patient repositioned, turned to right side.

## 2021-08-03 NOTE — Plan of Care (Addendum)
Dr. Estanislado Pandy [facility] earlier this evening regarding this patient's imaging and did not feel there is a thrombus at left ICA siphon, the appearance being more consistent with soft plaque. Recommendations are for patient to be NPO with Dr. Estanislado Pandy anticipating angiogram in AM with plan to put in basilar artery stent.   The patient initially was on heparin at [facility] which was stopped given low suspicion for thrombus. He was loaded with ASA and Brilinta by the Neurology service at [facility] prior to transfer to [facility]. The patient will be followed in the AM by the [facility] Stroke Team.   Electronically signed: Dr. Kerney Elbe

## 2021-08-03 NOTE — Progress Notes (Addendum)
SLP Cancellation Note  Patient Details Name: Joshua Donovan MRN: [id number] DOB: Nov 06, 1955   Cancelled treatment:       Reason Eval/Treat Not Completed: Medical issues which prohibited therapy;Patient not medically ready (chart reviewed; consulted NSG then met w/ pt and Family in room.) Per chart review and NSG, Neurology/MDs are recommending transfer to [facility] for further care in setting of evolving neurologic symptoms; MRI results.  ST services will Hold on BSE at this time d/t the above; recommend f/u at [facility]. Recommend frequent oral care for hygiene and stimulation of swallowing(swabs provided to family w/ ed). Education provided to pt and Family in room. ST services can be available as needed.     Orinda Kenner, MS, CCC-SLP Speech Language Pathologist Rehab Services; [place] [phone] (ascom) Arjuna Doeden 08/03/2021, 11:38 AM

## 2021-08-03 NOTE — Subjective & Objective (Signed)
CC: medulla CVA HPI: 66 year old African-American male initially admitted to [facility] on 08/03/2021 with weakness of his right arm and right leg.  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  No hemorrhage.  Patient seen by neurology.  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  Case was discussed with neuro interventional radiology.  Patient transferred to [facility] to undergo cerebral angiogram possible stenting.  Patient still having dense hemiplegia of the right upper extremity and right lower extremity.  He also has left upper extremity hemiparesis.  He was seen by physical therapy but not yet by Occupational Therapy.

## 2021-08-03 NOTE — ED Notes (Signed)
Called [facility] for update pt still waiting on bed assignment spoke w/Thomas

## 2021-08-03 NOTE — Progress Notes (Signed)
Pt still at [facility]. D/W Dr Estanislado Pandy. Does not feel strongly about the thrombus in LICA. Also no stroke in left hemisphere. Basilar needs stenting. Will dc heparin and load with ASA and Brilinta.  -- Amie Portland, MD Neurologist [facility] Pager: [phone]

## 2021-08-03 NOTE — Assessment & Plan Note (Signed)
Right MTP joint pain uric acid normal but could be gout Tylenol, NSAIDs as needed

## 2021-08-03 NOTE — ED Notes (Signed)
RECEIVED CALL FROM TARA @ [facility] WITH BED ASSIGNMENT. 3 WEST ROOM 18 , REPORT TO [phone]

## 2021-08-03 NOTE — Progress Notes (Signed)
Inpatient Rehab Admissions Coordinator:   Therapy recommending CIR, however mobility not assessed.  Will follow for full therapy assessment before assessing candidacy.    Shann Medal, PT, DPT Admissions Coordinator [phone] 08/03/21  2:43 PM

## 2021-08-04 ENCOUNTER — Encounter (HOSPITAL_COMMUNITY): Payer: Self-pay | Admitting: Internal Medicine

## 2021-08-04 ENCOUNTER — Inpatient Hospital Stay (HOSPITAL_COMMUNITY): Admission: RE | Admit: 2021-08-04 | Payer: Medicare PPO | Source: Home / Self Care | Admitting: Interventional Radiology

## 2021-08-04 ENCOUNTER — Ambulatory Visit (HOSPITAL_COMMUNITY)
Admit: 2021-08-04 | Discharge: 2021-08-04 | Disposition: A | Discharge status: Home / Self Care | Payer: Medicare PPO | Attending: Interventional Radiology | Admitting: Interventional Radiology

## 2021-08-04 ENCOUNTER — Inpatient Hospital Stay (HOSPITAL_COMMUNITY): Payer: Medicare PPO

## 2021-08-04 ENCOUNTER — Inpatient Hospital Stay (HOSPITAL_COMMUNITY): Payer: Medicare PPO | Admitting: Anesthesiology

## 2021-08-04 ENCOUNTER — Encounter (HOSPITAL_COMMUNITY)
Admission: AD | Disposition: A | Discharge status: Skilled Nursing Facility | Payer: Self-pay | Source: Other Acute Inpatient Hospital | Attending: Internal Medicine

## 2021-08-04 DIAGNOSIS — G8250 Quadriplegia, unspecified: Secondary | ICD-10-CM | POA: Diagnosis not present

## 2021-08-04 DIAGNOSIS — E782 Mixed hyperlipidemia: Secondary | ICD-10-CM | POA: Diagnosis not present

## 2021-08-04 DIAGNOSIS — I6322 Cerebral infarction due to unspecified occlusion or stenosis of basilar arteries: Secondary | ICD-10-CM | POA: Diagnosis not present

## 2021-08-04 DIAGNOSIS — G890 Central pain syndrome: Secondary | ICD-10-CM

## 2021-08-04 DIAGNOSIS — I63211 Cerebral infarction due to unspecified occlusion or stenosis of right vertebral arteries: Secondary | ICD-10-CM | POA: Diagnosis not present

## 2021-08-04 DIAGNOSIS — E1165 Type 2 diabetes mellitus with hyperglycemia: Secondary | ICD-10-CM | POA: Diagnosis not present

## 2021-08-04 DIAGNOSIS — F1010 Alcohol abuse, uncomplicated: Secondary | ICD-10-CM | POA: Diagnosis not present

## 2021-08-04 DIAGNOSIS — I10 Essential (primary) hypertension: Secondary | ICD-10-CM | POA: Diagnosis not present

## 2021-08-04 DIAGNOSIS — I639 Cerebral infarction, unspecified: Secondary | ICD-10-CM | POA: Diagnosis not present

## 2021-08-04 HISTORY — PX: IR US GUIDE VASC ACCESS RIGHT: IMG2390

## 2021-08-04 HISTORY — PX: IR CT HEAD LTD: IMG2386

## 2021-08-04 HISTORY — PX: IR ANGIO VERTEBRAL SEL VERTEBRAL UNI R MOD SED: IMG5368

## 2021-08-04 HISTORY — PX: RADIOLOGY WITH ANESTHESIA: SHX6223

## 2021-08-04 HISTORY — PX: IR ANGIO VERTEBRAL SEL SUBCLAVIAN INNOMINATE UNI L MOD SED: IMG5364

## 2021-08-04 HISTORY — PX: IR INTRA CRAN STENT: IMG2345

## 2021-08-04 HISTORY — PX: IR ANGIO INTRA EXTRACRAN SEL COM CAROTID INNOMINATE BILAT MOD SED: IMG5360

## 2021-08-04 LAB — BLOOD GAS, ARTERIAL
Acid-base deficit: 1.7 mmol/L (ref 0.0–2.0)
Acid-base deficit: 5.2 mmol/L — ABNORMAL HIGH (ref 0.0–2.0)
Bicarbonate: 23 mmol/L (ref 20.0–28.0)
Bicarbonate: 20 mmol/L (ref 20.0–28.0)
Drawn by: 33176
Drawn by: 33176
O2 Saturation: 100 %
O2 Saturation: 98.9 %
Patient temperature: 36.7
Patient temperature: 36.9
pCO2 arterial: 38 mmHg (ref 32–48)
pCO2 arterial: 37 mmHg (ref 32–48)
pH, Arterial: 7.39 (ref 7.35–7.45)
pH, Arterial: 7.34 — ABNORMAL LOW (ref 7.35–7.45)
pO2, Arterial: 100 mmHg (ref 83–108)
pO2, Arterial: 116 mmHg — ABNORMAL HIGH (ref 83–108)

## 2021-08-04 LAB — ECHOCARDIOGRAM COMPLETE
AR max vel: 2.1 cm2
AV Area VTI: 2.3 cm2
AV Area mean vel: 2.06 cm2
AV Mean grad: 5 mmHg
AV Peak grad: 9.1 mmHg
Ao pk vel: 1.51 m/s
Area-P 1/2: 3.2 cm2
Height: 67 in
P 1/2 time: 1577 msec
S' Lateral: 2.17 cm
Weight: 2960 oz

## 2021-08-04 LAB — GLUCOSE, CAPILLARY
Glucose-Capillary: 155 mg/dL — ABNORMAL HIGH (ref 70–99)
Glucose-Capillary: 170 mg/dL — ABNORMAL HIGH (ref 70–99)
Glucose-Capillary: 174 mg/dL — ABNORMAL HIGH (ref 70–99)
Glucose-Capillary: 176 mg/dL — ABNORMAL HIGH (ref 70–99)
Glucose-Capillary: 218 mg/dL — ABNORMAL HIGH (ref 70–99)
Glucose-Capillary: 292 mg/dL — ABNORMAL HIGH (ref 70–99)
Glucose-Capillary: 372 mg/dL — ABNORMAL HIGH (ref 70–99)
Glucose-Capillary: 383 mg/dL — ABNORMAL HIGH (ref 70–99)

## 2021-08-04 LAB — SURGICAL PCR SCREEN
MRSA, PCR: NEGATIVE
Staphylococcus aureus: POSITIVE — AB

## 2021-08-04 LAB — APTT: aPTT: 45 seconds — ABNORMAL HIGH (ref 24–36)

## 2021-08-04 LAB — POCT ACTIVATED CLOTTING TIME
Activated Clotting Time: 179 seconds
Activated Clotting Time: 185 seconds

## 2021-08-04 SURGERY — IR WITH ANESTHESIA
Anesthesia: General

## 2021-08-04 MED ORDER — PHENYLEPHRINE 80 MCG/ML (10ML) SYRINGE FOR IV PUSH (FOR BLOOD PRESSURE SUPPORT)
PREFILLED_SYRINGE | INTRAVENOUS | Status: DC | PRN
  Administered 2021-08-04: 20 ug via INTRAVENOUS

## 2021-08-04 MED ORDER — EPTIFIBATIDE 20 MG/10ML IV SOLN
INTRAVENOUS | Status: AC | PRN
  Administered 2021-08-04 (×3): 1.5 mg via INTRAVENOUS

## 2021-08-04 MED ORDER — CLEVIDIPINE BUTYRATE 0.5 MG/ML IV EMUL
INTRAVENOUS | Status: AC
  Filled 2021-08-04: qty 50

## 2021-08-04 MED ORDER — TICAGRELOR 90 MG PO TABS: 90.0000 mg | ORAL_TABLET | Freq: Two times a day (BID) | ORAL | Status: DC

## 2021-08-04 MED ORDER — FENTANYL BOLUS VIA INFUSION
25.0000 ug | INTRAVENOUS | Status: DC | PRN
  Administered 2021-08-04: 25 ug via INTRAVENOUS

## 2021-08-04 MED ORDER — HEPARIN (PORCINE) 25000 UT/250ML-% IV SOLN
850.0000 [IU]/h | INTRAVENOUS | Status: DC
Start: 2021-08-04 — End: 2021-08-04
  Filled 2021-08-04: qty 250

## 2021-08-04 MED ORDER — OXYCODONE HCL 5 MG/5ML PO SOLN: 5.0000 mg | Freq: Once | ORAL | Status: DC | PRN

## 2021-08-04 MED ORDER — LACTATED RINGERS IV SOLN: INTRAVENOUS | Status: DC | PRN

## 2021-08-04 MED ORDER — HEPARIN (PORCINE) 25000 UT/250ML-% IV SOLN
INTRAVENOUS | Status: AC
  Filled 2021-08-04: qty 250

## 2021-08-04 MED ORDER — CHLORHEXIDINE GLUCONATE CLOTH 2 % EX PADS
6.0000 | MEDICATED_PAD | Freq: Every day | CUTANEOUS | Status: DC
Start: 2021-08-04 — End: 2021-08-07
  Administered 2021-08-04 – 2021-08-06 (×3): 6 via TOPICAL

## 2021-08-04 MED ORDER — ROCURONIUM BROMIDE 10 MG/ML (PF) SYRINGE
PREFILLED_SYRINGE | INTRAVENOUS | Status: DC | PRN
  Administered 2021-08-04: 70 mg via INTRAVENOUS
  Administered 2021-08-04: 30 mg via INTRAVENOUS

## 2021-08-04 MED ORDER — DEXAMETHASONE SODIUM PHOSPHATE 10 MG/ML IJ SOLN
INTRAMUSCULAR | Status: DC | PRN
  Administered 2021-08-04: 5 mg via INTRAVENOUS

## 2021-08-04 MED ORDER — ADULT MULTIVITAMIN LIQUID CH
15.0000 mL | Freq: Every day | ORAL | Status: DC
  Administered 2021-08-05 – 2021-08-19 (×15): 15 mL
  Filled 2021-08-04 (×16): qty 15

## 2021-08-04 MED ORDER — LABETALOL HCL 5 MG/ML IV SOLN
10.0000 mg | INTRAVENOUS | Status: DC | PRN
  Administered 2021-08-05: 10 mg via INTRAVENOUS
  Filled 2021-08-04: qty 4

## 2021-08-04 MED ORDER — ACETAMINOPHEN 500 MG PO TABS
1000.0000 mg | ORAL_TABLET | Freq: Once | ORAL | Status: AC
  Administered 2021-08-04: 1000 mg via ORAL
  Filled 2021-08-04: qty 2

## 2021-08-04 MED ORDER — IOHEXOL 300 MG/ML  SOLN
100.0000 mL | Freq: Once | INTRAMUSCULAR | Status: AC | PRN
  Administered 2021-08-04: 50 mL via INTRA_ARTERIAL

## 2021-08-04 MED ORDER — CLEVIDIPINE BUTYRATE 0.5 MG/ML IV EMUL
INTRAVENOUS | Status: DC | PRN
  Administered 2021-08-04: 1 mg/h via INTRAVENOUS

## 2021-08-04 MED ORDER — PROPOFOL 500 MG/50ML IV EMUL
INTRAVENOUS | Status: DC | PRN
  Administered 2021-08-04: 50 ug/kg/min via INTRAVENOUS

## 2021-08-04 MED ORDER — SUGAMMADEX SODIUM 200 MG/2ML IV SOLN
INTRAVENOUS | Status: DC | PRN
  Administered 2021-08-04: 200 mg via INTRAVENOUS

## 2021-08-04 MED ORDER — PANTOPRAZOLE 2 MG/ML SUSPENSION: 40.0000 mg | Freq: Every day | ORAL | Status: DC

## 2021-08-04 MED ORDER — ORAL CARE MOUTH RINSE
15.0000 mL | OROMUCOSAL | Status: DC | PRN
Start: 2021-08-04 — End: 2021-08-08

## 2021-08-04 MED ORDER — SODIUM CHLORIDE 0.9 % IV SOLN: INTRAVENOUS | Status: DC

## 2021-08-04 MED ORDER — FENTANYL CITRATE (PF) 100 MCG/2ML IJ SOLN
INTRAMUSCULAR | Status: DC | PRN
  Administered 2021-08-04 (×2): 50 ug via INTRAVENOUS

## 2021-08-04 MED ORDER — ORAL CARE MOUTH RINSE
15.0000 mL | OROMUCOSAL | Status: DC
  Administered 2021-08-04 – 2021-08-08 (×40): 15 mL via OROMUCOSAL

## 2021-08-04 MED ORDER — CEFAZOLIN SODIUM-DEXTROSE 2-4 GM/100ML-% IV SOLN
INTRAVENOUS | Status: AC
  Filled 2021-08-04: qty 100

## 2021-08-04 MED ORDER — CLEVIDIPINE BUTYRATE 0.5 MG/ML IV EMUL
0.0000 mg/h | INTRAVENOUS | Status: DC
  Administered 2021-08-04: 14 mg/h via INTRAVENOUS
  Administered 2021-08-04: 12 mg/h via INTRAVENOUS
  Administered 2021-08-04: 21 mg/h via INTRAVENOUS
  Administered 2021-08-05: 8 mg/h via INTRAVENOUS
  Administered 2021-08-05: 17 mg/h via INTRAVENOUS
  Administered 2021-08-05: 7 mg/h via INTRAVENOUS
  Administered 2021-08-05: 8 mg/h via INTRAVENOUS
  Administered 2021-08-05: 13 mg/h via INTRAVENOUS
  Administered 2021-08-05: 15 mg/h via INTRAVENOUS
  Administered 2021-08-06: 10 mg/h via INTRAVENOUS
  Administered 2021-08-07 – 2021-08-08 (×3): 2 mg/h via INTRAVENOUS
  Administered 2021-08-08: 1 mg/h via INTRAVENOUS
  Administered 2021-08-08: 4 mg/h via INTRAVENOUS
  Filled 2021-08-04: qty 100
  Filled 2021-08-04 (×4): qty 50
  Filled 2021-08-04: qty 100
  Filled 2021-08-04: qty 50
  Filled 2021-08-04: qty 100
  Filled 2021-08-04 (×2): qty 50
  Filled 2021-08-04: qty 100
  Filled 2021-08-04 (×2): qty 50
  Filled 2021-08-04 (×3): qty 100
  Filled 2021-08-04: qty 50

## 2021-08-04 MED ORDER — LIDOCAINE 2% (20 MG/ML) 5 ML SYRINGE
INTRAMUSCULAR | Status: DC | PRN
  Administered 2021-08-04: 40 mg via INTRAVENOUS
  Administered 2021-08-04: 60 mg via INTRAVENOUS

## 2021-08-04 MED ORDER — PHENYLEPHRINE HCL-NACL 20-0.9 MG/250ML-% IV SOLN
INTRAVENOUS | Status: DC | PRN
  Administered 2021-08-04: 25 ug/min via INTRAVENOUS

## 2021-08-04 MED ORDER — ASPIRIN 81 MG PO TBEC
81.0000 mg | DELAYED_RELEASE_TABLET | Freq: Every day | ORAL | Status: DC
  Administered 2021-08-04: 81 mg via ORAL
  Filled 2021-08-04: qty 1

## 2021-08-04 MED ORDER — POLYETHYLENE GLYCOL 3350 17 G PO PACK
17.0000 g | PACK | Freq: Every day | ORAL | Status: DC
  Administered 2021-08-05 – 2021-08-12 (×4): 17 g
  Filled 2021-08-04 (×5): qty 1

## 2021-08-04 MED ORDER — CEFAZOLIN SODIUM-DEXTROSE 2-3 GM-%(50ML) IV SOLR
INTRAVENOUS | Status: DC | PRN
  Administered 2021-08-04: 2 g via INTRAVENOUS

## 2021-08-04 MED ORDER — EPTIFIBATIDE 20 MG/10ML IV SOLN
INTRAVENOUS | Status: AC
  Filled 2021-08-04: qty 10

## 2021-08-04 MED ORDER — DOCUSATE SODIUM 50 MG/5ML PO LIQD
100.0000 mg | Freq: Two times a day (BID) | ORAL | Status: DC
  Administered 2021-08-05 – 2021-08-07 (×4): 100 mg
  Filled 2021-08-04 (×5): qty 10

## 2021-08-04 MED ORDER — PROPOFOL 10 MG/ML IV BOLUS
INTRAVENOUS | Status: AC
  Filled 2021-08-04: qty 20

## 2021-08-04 MED ORDER — TICAGRELOR 90 MG PO TABS
90.0000 mg | ORAL_TABLET | Freq: Two times a day (BID) | ORAL | Status: DC
  Administered 2021-08-05 – 2021-08-10 (×12): 90 mg
  Filled 2021-08-04 (×13): qty 1

## 2021-08-04 MED ORDER — FENTANYL 2500MCG IN NS 250ML (10MCG/ML) PREMIX INFUSION
25.0000 ug/h | INTRAVENOUS | Status: DC
  Administered 2021-08-04: 25 ug/h via INTRAVENOUS

## 2021-08-04 MED ORDER — HEPARIN (PORCINE) 25000 UT/250ML-% IV SOLN
850.0000 [IU]/h | INTRAVENOUS | Status: DC
Start: 2021-08-04 — End: 2021-08-05
  Administered 2021-08-04: 10 [IU]/kg/h via INTRAVENOUS
  Filled 2021-08-04: qty 250

## 2021-08-04 MED ORDER — CHLORHEXIDINE GLUCONATE 0.12 % MT SOLN
OROMUCOSAL | Status: AC
  Filled 2021-08-04: qty 15

## 2021-08-04 MED ORDER — PANTOPRAZOLE 2 MG/ML SUSPENSION
40.0000 mg | Freq: Every day | ORAL | Status: DC
  Administered 2021-08-05 – 2021-08-16 (×11): 40 mg
  Filled 2021-08-04 (×12): qty 20

## 2021-08-04 MED ORDER — FOLIC ACID 1 MG PO TABS
1.0000 mg | ORAL_TABLET | Freq: Every day | ORAL | Status: DC
  Administered 2021-08-05 – 2021-11-26 (×113): 1 mg
  Filled 2021-08-04 (×113): qty 1

## 2021-08-04 MED ORDER — AMISULPRIDE (ANTIEMETIC) 5 MG/2ML IV SOLN: 10.0000 mg | Freq: Once | INTRAVENOUS | Status: DC | PRN

## 2021-08-04 MED ORDER — MUPIROCIN 2 % EX OINT
1.0000 | TOPICAL_OINTMENT | Freq: Two times a day (BID) | CUTANEOUS | Status: AC
  Administered 2021-08-04 – 2021-08-08 (×9): 1 via NASAL
  Filled 2021-08-04 (×2): qty 22

## 2021-08-04 MED ORDER — CHLORHEXIDINE GLUCONATE 0.12 % MT SOLN
15.0000 mL | OROMUCOSAL | Status: AC
  Filled 2021-08-04: qty 15

## 2021-08-04 MED ORDER — DEXMEDETOMIDINE HCL IN NACL 400 MCG/100ML IV SOLN
0.0000 ug/kg/h | INTRAVENOUS | Status: AC
  Administered 2021-08-04 – 2021-08-05 (×2): 0.4 ug/kg/h via INTRAVENOUS
  Administered 2021-08-05 – 2021-08-06 (×3): 0.5 ug/kg/h via INTRAVENOUS
  Administered 2021-08-06: 0.4 ug/kg/h via INTRAVENOUS
  Administered 2021-08-07: 0.5 ug/kg/h via INTRAVENOUS
  Filled 2021-08-04 (×7): qty 100

## 2021-08-04 MED ORDER — IOHEXOL 300 MG/ML  SOLN
100.0000 mL | Freq: Once | INTRAMUSCULAR | Status: AC | PRN
  Administered 2021-08-04: 75 mL via INTRA_ARTERIAL

## 2021-08-04 MED ORDER — FENTANYL CITRATE (PF) 100 MCG/2ML IJ SOLN: 25.0000 ug | INTRAMUSCULAR | Status: DC | PRN

## 2021-08-04 MED ORDER — ASPIRIN 81 MG PO TBEC
81.0000 mg | DELAYED_RELEASE_TABLET | Freq: Every day | ORAL | Status: DC
  Filled 2021-08-04: qty 1

## 2021-08-04 MED ORDER — PROPOFOL 10 MG/ML IV BOLUS
INTRAVENOUS | Status: DC | PRN
  Administered 2021-08-04: 20 mg via INTRAVENOUS
  Administered 2021-08-04: 60 mg via INTRAVENOUS
  Administered 2021-08-04: 200 mg via INTRAVENOUS

## 2021-08-04 MED ORDER — FENTANYL 2500MCG IN NS 250ML (10MCG/ML) PREMIX INFUSION
INTRAVENOUS | Status: AC
  Filled 2021-08-04: qty 250

## 2021-08-04 MED ORDER — THIAMINE HCL 100 MG/ML IJ SOLN
100.0000 mg | Freq: Every day | INTRAMUSCULAR | Status: DC
Start: 2021-08-04 — End: 2021-08-07
  Administered 2021-08-04 – 2021-08-06 (×3): 100 mg via INTRAVENOUS
  Filled 2021-08-04 (×3): qty 2

## 2021-08-04 MED ORDER — TICAGRELOR 90 MG PO TABS
90.0000 mg | ORAL_TABLET | Freq: Once | ORAL | Status: AC
Start: 2021-08-04 — End: 2021-08-04
  Administered 2021-08-04: 90 mg via ORAL
  Filled 2021-08-04: qty 1

## 2021-08-04 MED ORDER — OXYCODONE HCL 5 MG PO TABS: 5.0000 mg | ORAL_TABLET | Freq: Once | ORAL | Status: DC | PRN

## 2021-08-04 MED ORDER — FENTANYL CITRATE PF 50 MCG/ML IJ SOSY
25.0000 ug | PREFILLED_SYRINGE | Freq: Once | INTRAMUSCULAR | Status: AC
  Administered 2021-08-04: 25 ug via INTRAVENOUS

## 2021-08-04 MED ORDER — ONDANSETRON HCL 4 MG/2ML IJ SOLN
INTRAMUSCULAR | Status: DC | PRN
  Administered 2021-08-04: 4 mg via INTRAVENOUS

## 2021-08-04 MED ORDER — ESMOLOL HCL 100 MG/10ML IV SOLN
INTRAVENOUS | Status: DC | PRN
  Administered 2021-08-04 (×2): 20 mg via INTRAVENOUS

## 2021-08-04 MED ORDER — ONDANSETRON HCL 4 MG/2ML IJ SOLN: 4.0000 mg | Freq: Once | INTRAMUSCULAR | Status: DC | PRN

## 2021-08-04 MED ORDER — HEPARIN SODIUM (PORCINE) 1000 UNIT/ML IJ SOLN
INTRAMUSCULAR | Status: DC | PRN
  Administered 2021-08-04: 3000 [IU] via INTRAVENOUS
  Administered 2021-08-04: 500 [IU] via INTRAVENOUS

## 2021-08-04 MED ORDER — SODIUM CHLORIDE 0.9 % IV SOLN: INTRAVENOUS | Status: DC | PRN

## 2021-08-04 NOTE — Progress Notes (Signed)
Chief Complaint: Patient was seen in consultation today for  CVA  Referring Physician(s): Dr. Erlinda Hong Dr. Cheral Marker  Supervising Physician: Luanne Bras  Patient Status: [facility] - In-pt  History of Present Illness: Joshua Donovan is a 66 y.o. male who has been transferred to [facility] from [facility] for post-stroke management including cerebral angiogram to further evaluate a left ICA thrombus versus soft plaque.  He presented to the emergency room at [facility] on Monday night with complaints right foot and leg pain with weakness as well as right hand numbness.   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 He was admitted for further stroke work-up. His weakness continued to worsen to also include the left leg and he also had worsening weakness on the right side with right arm also being affected. CT angio head and neck was obtained (see details below), revealing multiple intracranial severe stenoses, including near-occlusive stenosis of the vertebrobasilar junction, occlusion of the distal left V4, moderate mid basilar stenosis, nearly occlusive bilateral PCA P2 segments, nearly occlusive distal right ACA A2 segments and bulky soft plaque or thrombus in the supraclinoid left ICA with up to moderate stenosis along with mild to moderate right M1 stenosis.  NIR was consulted and the pt has been transferred to [facility] for possible intervention. He was loaded with ASA and Brilinta by the Neurology service at [facility] prior to transfer to [facility].   Past Medical History:  Diagnosis Date   Diabetes mellitus without complication (HCC)    GERD (gastroesophageal reflux disease)    Hemorrhage in the brain ([place]) 08/2015   High cholesterol    Hyperlipidemia    Hypertension    Hypertensive emergency 08/03/2021    Past Surgical History:  Procedure Laterality Date   COLONOSCOPY WITH PROPOFOL N/A 09/08/2017   Procedure: COLONOSCOPY WITH PROPOFOL;  Surgeon: Lucilla Lame, MD;  Location: [place];  Service: Endoscopy;  Laterality: N/A;  Diabetic - oral meds   partial intestine removed     approx date: 1980   POLYPECTOMY  09/08/2017   Procedure: POLYPECTOMY;  Surgeon: Lucilla Lame, MD;  Location: [place];  Service: Endoscopy;;    Allergies: Lisinopril, Ace inhibitors, Anchovies [fish allergy], and Sulfa antibiotics  Medications:  Current Facility-Administered Medications:     stroke: early stages of recovery book, , Does not apply, Once, Kristopher Oppenheim, DO   acetaminophen (TYLENOL) tablet 650 mg, 650 mg, Oral, Q4H PRN **OR** acetaminophen (TYLENOL) 160 MG/5ML solution 650 mg, 650 mg, Per Tube, Q4H PRN **OR** acetaminophen (TYLENOL) suppository 650 mg, 650 mg, Rectal, Q4H PRN, Kristopher Oppenheim, DO   atorvastatin (LIPITOR) tablet 80 mg, 80 mg, Oral, Daily, Kristopher Oppenheim, DO   insulin aspart (novoLOG) injection 0-15 Units, 0-15 Units, Subcutaneous, TID WC, Kristopher Oppenheim, DO, 3 Units at 08/04/21 0654   insulin aspart (novoLOG) injection 0-5 Units, 0-5 Units, Subcutaneous, QHS, Chen, Eric, DO   insulin glargine-yfgn [facility]) injection 5 Units, 5 Units, Subcutaneous, Daily, Kristopher Oppenheim, DO   mupirocin ointment (BACTROBAN) 2 % 1 Application, 1 Application, Nasal, BID, Kristopher Oppenheim, DO    Family History  Problem Relation Age of Onset   Hypertension Father    Diabetes Sister     Social History   Socioeconomic History   Marital status: Married    Spouse name: Not on file   Number of children: Not on file   Years of education: Not on file   Highest education level: Not on file  Occupational History  Not on file  Tobacco Use   Smoking status: Former    Types: Cigarettes    Quit date: 1990    Years since quitting: 33.5   Smokeless tobacco: Never  Vaping Use   Vaping Use: Never used  Substance and Sexual Activity   Alcohol use: Yes    Alcohol/week: 7.0 standard drinks of alcohol    Types: 7 Cans of beer per week    Comment: social   Drug use: Never   Sexual activity: Not  on file  Other Topics Concern   Not on file  Social History Narrative   Not on file   Social Determinants of Health   Financial Resource Strain: Not on file  Food Insecurity: Not on file  Transportation Needs: Not on file  Physical Activity: Not on file  Stress: Not on file  Social Connections: Not on file    Review of Systems: A 12 point ROS discussed and pertinent positives are indicated in the HPI above.  All other systems are negative.  Review of Systems  Vital Signs: BP (!) 142/82 (BP Location: Left Arm)   Pulse 84   Temp 98.7 F (37.1 C) (Oral)   Resp 16   SpO2 100%   Physical Exam Constitutional:      Appearance: Normal appearance. He is not toxic-appearing.  HENT:     Mouth/Throat:     Mouth: Mucous membranes are moist.     Pharynx: Oropharynx is clear.  Cardiovascular:     Rate and Rhythm: Normal rate and regular rhythm.     Heart sounds: Normal heart sounds.  Pulmonary:     Effort: Pulmonary effort is normal. No respiratory distress.     Breath sounds: Normal breath sounds.  Neurological:     Mental Status: He is oriented to person, place, and time.     Comments: Neurological Examination Mental Status: Awkae, alert and fully oriented. Speech mildly dysarthric but fluent without evidence of aphasia.  Able to follow all commands without difficulty. Cranial Nerves: PERRLA, EOMI.  Temp sensation equal bilaterally No facial droop; mild asymmetric lip swelling noted  Motor: RUE: 1/5 deltoid, 2/5 biceps, 0/5 hand grip RLE 0/5 proximally and distally LUE 3-4/5 proximally and distally LLE: 2/5 hip flexion, knee flexion, knee extension Sensory: Temp and light touch intact throughout, bilaterally without asymmetry   Psychiatric:        Mood and Affect: Mood normal.        Thought Content: Thought content normal.       Imaging: MR BRAIN WO CONTRAST  Result Date: 08/03/2021 CLINICAL DATA:  66 year old male with severe intracranial atherosclerosis and  questionable medullary ischemia on brain MRI yesterday. EXAM: MRI HEAD WITHOUT CONTRAST TECHNIQUE: Multiplanar, multiecho pulse sequences of the brain and surrounding structures were obtained without intravenous contrast. COMPARISON:  CTA head and neck this morning.  Brain MRI yesterday. FINDINGS: Brain: More convincing appearance of ventral medullary restricted diffusion, asymmetrically greater to the left of midline. See series 5, image 12, series 7, images 18 and 19. Mild if any associated T2 and FLAIR hyperintensity there. And no other convincing diffusion restriction despite the CTA findings earlier today. 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 Chronic encephalomalacia right inferior frontal gyrus with hemosiderin. Occasional chronic microhemorrhages elsewhere, right cerebellum (series 13, image 19). No new signal abnormality compared to yesterday. No midline shift, mass effect, evidence of mass lesion, ventriculomegaly, extra-axial collection or acute intracranial hemorrhage. Cervicomedullary junction and  pituitary are within normal limits. Vascular: Major intracranial vascular flow voids are stable from yesterday. See also CTA today reported separately. Skull and upper cervical spine: Negative. Visualized bone marrow signal is within normal limits. Sinuses/Orbits: Stable, negative. Other: Visible internal auditory structures appear normal. Mastoids are clear. Negative visible scalp and face. IMPRESSION: 1. Positive for ischemia/infarction of the ventral Medulla as suspected yesterday, slightly greater on the left. No associated hemorrhage or mass effect. 2. 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 These results were communicated to Dr. Rory Percy at 11:06 am on 08/03/2021 by text page via the [facility] messaging system.  Electronically Signed   By: Genevie Ann M.D.   On: 08/03/2021 11:06   CT ANGIO HEAD NECK W WO CM  Addendum Date: 08/03/2021   ADDENDUM REPORT: 08/03/2021 09:17 ADDENDUM: Study discussed by telephone with Dr. Amie Portland on 08/03/2021 at 0911 hours. Electronically Signed   By: Genevie Ann M.D.   On: 08/03/2021 09:17   Result Date: 08/03/2021 CLINICAL DATA:  66 year old male with neurologic deficit. Questionable small vessel ischemia of the medulla on brain MRI yesterday. EXAM: CT ANGIOGRAPHY HEAD AND NECK TECHNIQUE: Multidetector CT imaging of the head and neck was performed using the standard protocol during bolus administration of intravenous contrast. Multiplanar CT image reconstructions and MIPs were obtained to evaluate the vascular anatomy. Carotid stenosis measurements (when applicable) are obtained utilizing NASCET criteria, using the distal internal carotid diameter as the denominator. RADIATION DOSE REDUCTION: This exam was performed according to the departmental dose-optimization program which includes automated exposure control, adjustment of the mA and/or kV according to patient size and/or use of iterative reconstruction technique. CONTRAST:  31m OMNIPAQUE IOHEXOL 350 MG/ML SOLN COMPARISON:  Brain MRI yesterday.  Head CT yesterday. FINDINGS: CT HEAD Brain: Bulky Calcified atherosclerosis at the skull base. Stable CT appearance of the brainstem since yesterday. No acute intracranial hemorrhage identified. No midline shift, mass effect, or evidence of intracranial mass lesion. Chronic right inferior frontal gyrus encephalomalacia, patchy bilateral cerebral white matter hypodensity more pronounced on the left. 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 No ventriculomegaly or acute cortically based infarct. Calvarium and skull base: No acute osseous abnormality identified. Paranasal sinuses: Visualized paranasal sinuses and mastoids are stable and well aerated. Orbits: Visualized orbits  and scalp soft tissues are within normal limits. CTA NECK Skeleton: Periapical dental lucency bilaterally. Ordinary cervical spine degeneration. No acute osseous abnormality identified. Upper chest: Centrilobular and paraseptal emphysema. No superior mediastinal lymphadenopathy. Other neck: 15 mm hypodense right thyroid nodule. Recommend thyroid [place](ref: J Am Coll Radiol. 2015 Feb;12(2): 143-50).Dermal and subcutaneous probable 2.6 cm sebaceous cyst of the left submandibular face (series 13, image 79). Otherwise negative CT appearance of the neck soft tissues. Aortic arch: 3 vessel arch configuration. Mild arch atherosclerosis. Right carotid system: Negative brachiocephalic artery and proximal right CCA. Retropharyngeal course of the right carotid bifurcation with mild calcified plaque. Tortuous right ICA distal to the bulb, no stenosis. Left carotid system: Similar mild tortuosity and atherosclerosis with no hemodynamically significant stenosis. Vertebral arteries: Tortuous proximal right subclavian artery with a mildly kinked appearance in the superior mediastinum. Normal right vertebral artery origin. Patent right vertebral artery to the skull base with no significant plaque or stenosis. Mild to moderate soft and calcified plaque in the proximal left subclavian artery with less than 50 % stenosis with respect to the distal vessel. Mild  atherosclerosis and stenosis at the left vertebral artery origin on series 13, image 159. Non dominant left vertebral artery than is patent to the skull base with no additional plaque or stenosis. CTA HEAD Posterior circulation: Bilateral V4 segment calcified plaque. Severe stenosis on the left (series 12, image 139) upstream of the left PICA origin which remains patent. But the left V4 segment is occluded distal to the left PICA. Contralateral right V4 moderate tandem stenoses upstream of the patent right PICA origin on series 12, image 130. And severe stenosis, near occlusion of  the right vertebrobasilar junction (series 16, image 26). The basilar artery remains patent but is moderately stenotic in the mid basilar segment on series 16, image 24. Distal basilar remains patent along with SCA and PCA origins. Left posterior communicating artery is present, the right is diminutive or absent. a mild to moderate stenosis left PCA origin. And bilateral P2 segment severe stenosis, near occlusion on series 15, image 53. But there is preserved distal PCA branch enhancement bilaterally. Anterior circulation: Both ICA siphons are patent. However, on the left there is bulky soft plaque or thrombus in the supraclinoid ICA (series 14, image 162) this is between normal appearing left ophthalmic and posterior communicating artery origins. Left carotid terminus remains patent. Contralateral right ICA siphon with mild to moderate calcified plaque and mild supraclinoid stenosis. Patent carotid termini, MCA and ACA origins. Tortuous A1 segments. Normal anterior communicating artery. Proximal A2 segments are normal. There is severe right ACA distal A2 segment stenosis, near occlusion on series 17, image 32. Left MCA M1 segment is mildly irregular. Left MCA bifurcation is patent without stenosis. Left MCA branches are mildly irregular. Mild to moderate somewhat long segment right MCA M1 stenosis along 6 mm series 16, image 20. Distal M1 and right MCA trifurcation remain patent. Right MCA branches are mildly irregular. Venous sinuses: Early contrast timing, grossly patent. Anatomic variants: Mildly dominant right vertebral artery. Review of the MIP images confirms the above findings IMPRESSION: 1. 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 - Moderate stenosis Mid Basilar Artery. - Near Occlusion bilateral PCA P2 segments, distal right ACA A2. - bulky soft plaque or thrombus in  the Supraclinoid Left ICA, with up to Moderate stenosis. - mild-to-moderate Right MCA M1 stenosis. 2. Stable CT appearance of the brain since yesterday. No new intracranial abnormality. 3. Aortic Atherosclerosis (ICD10-I70.0) and Emphysema (ICD10-J43.9). Electronically Signed: By: Genevie Ann M.D. On: 08/03/2021 09:01   US Carotid Bilateral (at [facility] and [facility] only)  Result Date: 08/03/2021 CLINICAL DATA:  Follow-up examination for stroke. EXAM: BILATERAL CAROTID DUPLEX ULTRASOUND TECHNIQUE: Pearline Cables scale imaging, color Doppler and duplex ultrasound were performed of bilateral carotid and vertebral arteries in the neck. COMPARISON:  Prior Study from 04/03/2014. FINDINGS: Criteria: Quantification of carotid stenosis is based on velocity parameters that correlate the residual internal carotid diameter with NASCET-based stenosis levels, using the diameter of the distal internal carotid lumen as the denominator for stenosis measurement. The following velocity measurements were obtained: RIGHT ICA: 84/10 cm/sec CCA: 95/28 cm/sec SYSTOLIC ICA/CCA RATIO:  1.1 ECA: 64 cm/sec LEFT ICA: 64/16 cm/sec CCA: 413/24 cm/sec SYSTOLIC ICA/CCA RATIO:  0.9 ECA: 117 cm/sec RIGHT CAROTID ARTERY: Mild smooth intimal thickening seen within the visualized right CCA without significant stenosis. Mild heterogeneous mural based echogenic plaque seen about the right carotid bulb. No significant elevation in peak systolic velocity to suggest hemodynamically  significant greater than 50% stenosis. Visualized right ICA patent distally without stenosis. RIGHT VERTEBRAL ARTERY:  Patent with antegrade flow. LEFT CAROTID ARTERY: Diffuse smooth intimal thickening seen within the visualized left CCA without significant stenosis. Mild scattered heterogeneous echogenic plaque about the left carotid bulb/proximal left ICA. No significant elevation in peak systolic velocity to suggest hemodynamically significant greater than 50% stenosis. Visualized left ICA patent  distally without visible stenosis. LEFT VERTEBRAL ARTERY:  Patent with antegrade flow. IMPRESSION: 1. Mild atherosclerotic plaque within the carotid bulbs and proximal ICAs bilaterally, but no hemodynamically significant greater than 50% stenosis. 2. Patent antegrade flow within both vertebral arteries. Electronically Signed   By: Jeannine Boga M.D.   On: 08/03/2021 03:26   MR Cervical Spine Wo Contrast  Result Date: 08/03/2021 CLINICAL DATA:  Initial evaluation for ataxia, right-sided numbness. EXAM: MRI CERVICAL SPINE WITHOUT CONTRAST TECHNIQUE: Multiplanar, multisequence MR imaging of the cervical spine was performed. No intravenous contrast was administered. COMPARISON:  None available. FINDINGS: Alignment: Examination degraded by motion artifact. Straightening with mild reversal of the normal cervical lordosis. No listhesis. Vertebrae: Vertebral body height maintained without acute or chronic fracture. Bone marrow signal intensity within normal limits. No discrete or worrisome osseous lesions or abnormal marrow edema. Cord: Normal signal and morphology. No convincing cord signal changes on this motion degraded exam. Posterior Fossa, vertebral arteries, paraspinal tissues: Unremarkable. Disc levels: C2-C3: Unremarkable. C3-C4: Mild disc bulge with endplate and uncovertebral spurring. Flattening and partial effacement of the ventral thecal sac with resultant mild spinal stenosis. Moderate bilateral C4 foraminal narrowing. C4-C5: Mild disc bulge with uncovertebral spurring. Mild flattening of the ventral thecal sac without significant spinal stenosis. Moderate right with mild left C5 foraminal stenosis. C5-C6: Minimal disc bulge with uncovertebral spurring. No spinal stenosis. Mild right C6 foraminal narrowing. Left neural foramen remains patent. C6-C7: Minimal annular disc bulge. No spinal stenosis. Foramina remain patent. C7-T1:  Unremarkable. Visualized upper thoracic spine demonstrates no  significant finding. IMPRESSION: 1. Motion degraded exam. 2. No acute abnormality within the cervical spine. 3. Mild multilevel cervical spondylosis with resultant mild spinal stenosis at C3-4. Associated moderate bilateral C4 foraminal stenosis, with mild right C5 and C6 foraminal narrowing. Electronically Signed   By: Jeannine Boga M.D.   On: 08/03/2021 01:04   MR BRAIN WO CONTRAST  Result Date: 08/03/2021 CLINICAL DATA:  Initial evaluation for neuro deficit, stroke suspected. EXAM: MRI HEAD WITHOUT CONTRAST TECHNIQUE: Multiplanar, multiecho pulse sequences of the brain and surrounding structures were obtained without intravenous contrast. COMPARISON:  Prior CT from earlier the same day as well as previous MRI from 09/17/2015. FINDINGS: Brain: Generalized age-related cerebral atrophy. Patchy T2/FLAIR hyperintensity involving the periventricular and deep white matter both cerebral hemispheres as well as the pons, most consistent with chronic small vessel ischemic disease, moderately advanced in nature. Area of encephalomalacia of involving parasagittal anterior/inferior right frontal lobe noted at site of prior hemorrhage. Additional smaller foci of encephalomalacia involving the inferior temporal poles bilaterally likely posttraumatic in nature. Few scattered small remote left cerebellar infarcts noted. Subtle area of diffusion signal abnormality involving the ventral medulla measuring up to approximately 8 mm is seen (a series 5, image 12). While this finding could potentially be artifactual, possibly due to prominent vascular calcifications about the adjacent vertebral arteries, a possible small acute ischemic infarct could be considered. No associated hemorrhage or mass effect. No other evidence for acute or subacute ischemia. No acute intracranial hemorrhage. Single chronic microhemorrhage noted within the right cerebellum, of doubtful  significance in isolation. No mass lesion, midline shift or mass  effect. No hydrocephalus or extra-axial fluid collection. Pituitary gland and suprasellar region within normal limits. Vascular: Major intracranial vascular flow voids are maintained. Skull and upper cervical spine: Craniocervical junction within normal limits. Bone marrow signal intensity normal. No scalp soft tissue abnormality. Sinuses/Orbits: Globes and orbital soft tissues within normal limits. Paranasal sinuses are largely clear. No mastoid effusion. Other: None. IMPRESSION: 1. 8 mm focus of diffusion signal abnormality involving the ventral medulla, indeterminate. While this finding could potentially be artifactual in nature, a possible small acute ischemic infarct is difficult to exclude, and could be considered in the correct clinical setting. No associated hemorrhage or mass effect. 2. No other acute intracranial abnormality. 3. Underlying atrophy with moderate chronic small vessel ischemic disease with a few small remote left cerebellar infarcts. Area of encephalomalacia of involving the anterior/inferior right frontal lobe at site of previous hemorrhage. Electronically Signed   By: Jeannine Boga M.D.   On: 08/03/2021 00:59   CT Head Wo Contrast  Result Date: 08/02/2021 CLINICAL DATA:  New right upper extremity tremor. Tingling to the arm. EXAM: CT HEAD WITHOUT CONTRAST TECHNIQUE: Contiguous axial images were obtained from the base of the skull through the vertex without intravenous contrast. RADIATION DOSE REDUCTION: This exam was performed according to the departmental dose-optimization program which includes automated exposure control, adjustment of the mA and/or kV according to patient size and/or use of iterative reconstruction technique. COMPARISON:  MRI brain 09/17/2015 FINDINGS: Brain: Mild cerebral atrophy. Mild ventricular dilatation consistent with central atrophy. Patchy low-attenuation changes in the deep white matter consistent small vessel ischemic change. Old area of  encephalomalacia in the right anterior frontal lobe corresponding to previous intraparenchymal hemorrhage on prior MRI. No acute mass effect or midline shift. No abnormal extra-axial fluid collections. Gray-white matter junctions are distinct. Basal cisterns are not effaced. No acute intracranial hemorrhage. Vascular: No hyperdense vessel or unexpected calcification. Skull: Normal. Negative for fracture or focal lesion. Sinuses/Orbits: Paranasal sinuses and mastoid air cells are clear. Other: None. IMPRESSION: No acute intracranial abnormalities. Chronic atrophy and small vessel ischemic changes. 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 Electronically Signed   By: Lucienne Capers M.D.   On: 08/02/2021 21:20   DG Foot Complete Right  Result Date: 08/02/2021 CLINICAL DATA:  Chronic RIGHT arm and leg pain for since Sunday, RIGHT leg cold to touch, swelling RIGHT great toe EXAM: RIGHT FOOT COMPLETE - 3+ VIEW COMPARISON:  None Available. FINDINGS: Osseous mineralization normal. 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 Remaining joint spaces preserved. No acute fracture, dislocation, or bone destruction. Small plantar calcaneal spur. IMPRESSION: Degenerative changes first MTP joint and small plantar calcaneal spur. No acute osseous abnormalities. Electronically Signed   By: Lavonia Dana M.D.   On: 08/02/2021 20:51    Labs:  CBC: Recent Labs    08/02/21 2027  WBC 6.4  HGB 14.2  HCT 43.0  PLT 296    COAGS: Recent Labs    08/03/21 1129  APTT 28    BMP: Recent Labs    08/02/21 2027 08/03/21 0347  NA 136 136  K 3.1* 3.4*  CL 101 101  CO2 25 24  GLUCOSE 182* 212*  BUN 14 11  CALCIUM 9.9 9.0  CREATININE 0.70 0.76  GFRNONAA >60 >60    LIVER FUNCTION TESTS: Recent Labs    08/02/21 2027  BILITOT 0.5  AST 26  ALT 28  ALKPHOS 29*  PROT 7.3  ALBUMIN 4.2     Assessment and Plan: Vertebrobasilar stenosis with stroke. Additional multivessel  intracranial stenosis. Plan for formal cerebral angiogram with intent to treat. Give additional ASA/Brilinta this am pre-procedure Have arranged anesthesia for case this am. Labs reviewed. Risks and benefits of cerebral angiogram with intervention were discussed with the patient including, but not limited to bleeding, infection, vascular injury or contrast induced renal failure.  This interventional procedure involves the use of X-rays and because of the nature of the planned procedure, it is possible that we will have prolonged use of X-ray fluoroscopy.  Potential radiation risks to you include (but are not limited to) the following: - A slightly elevated risk for cancer  several years later in life. This risk is typically less than 0.5% percent. This risk is low in comparison to the normal incidence of human cancer, which is 33% for women and 50% for men according to the [place]. - Radiation induced injury can include skin redness, resembling a rash, tissue breakdown / ulcers and hair loss (which can be temporary or permanent).   The likelihood of either of these occurring depends on the difficulty of the procedure and whether you are sensitive to radiation due to previous procedures, disease, or genetic conditions.   IF your procedure requires a prolonged use of radiation, you will be notified and given written instructions for further action.  It is your responsibility to monitor the irradiated area for the 2 weeks following the procedure and to notify your physician if you are concerned that you have suffered a radiation induced injury.    All of the patient's questions were answered, patient is agreeable to proceed.  Consent signed and in chart.    Electronically Signed: Ascencion Dike, PA-C 08/04/2021, 7:06 AM   I spent a total of 40 in face to face in clinical consultation, greater than 50% of which was counseling/coordinating care for cerebral angiogram

## 2021-08-04 NOTE — Progress Notes (Signed)
Patient was transported to 4N19 without any complications. Report was given to [facility]N RT.

## 2021-08-04 NOTE — Addendum Note (Signed)
Addendum  created 08/04/21 1521 by Valda Favia, CRNA   Child order released for a procedure order, Clinical Note Signed, Intraprocedure Blocks edited, LDA created via procedure documentation, LDA removed via procedure documentation, SmartForm saved

## 2021-08-04 NOTE — Progress Notes (Signed)
Patient transferred to Short Stay, handoff given to Avala. Bilateral pulses palpated and marked.  Patient remains NPO.   Patient's daughter Renard Matter [phone] at bedside, transferred to Radiology waiting atrium area, introduced her to Radiology staff and procedural nurse.

## 2021-08-04 NOTE — Progress Notes (Signed)
Referring Physician(s): Dr. Erlinda Hong Dr. Cheral Marker  Supervising Physician: Luanne Bras  Patient Status:  [facility] - In-pt  Chief Complaint:  S/p stent placement of R vertebrobasilar junction.  Subjective:   Pt mechanically ventilated.   Allergies: Lisinopril, Ace inhibitors, Anchovies [fish allergy], and Sulfa antibiotics  Medications: Prior to Admission medications   Medication Sig Start Date End Date Taking? Authorizing Provider  ACCU-CHEK FASTCLIX LANCETS MISC Use as directed twice daily dia E11.65 08/03/17   Lavera Guise, MD  atorvastatin (LIPITOR) 20 MG tablet Take 20 mg by mouth daily. 08/02/21   [provider]  atorvastatin (LIPITOR) 80 MG tablet Take 1 tablet (80 mg total) by mouth daily. 08/04/21   Wouk, Ailene Rud, MD  Blood Glucose Monitoring Suppl (ACCU-CHEK AVIVA PLUS) w/Device KIT Use as directed 10/04/17   Ronnell Freshwater, NP  cetirizine (ZYRTEC) 10 MG tablet Take 1 tablet (10 mg total) by mouth daily. 02/20/20   Lavera Guise, MD  glucose blood (ACCU-CHEK AVIVA PLUS) test strip Use two times daily to check blood sugar 10/05/17   Ronnell Freshwater, NP  lisinopril (ZESTRIL) 40 MG tablet Take 40 mg by mouth daily.    [provider]  metFORMIN (GLUCOPHAGE) 1000 MG tablet Take 1,000 mg by mouth 2 (two) times daily with a meal.    [provider]  metoprolol tartrate (LOPRESSOR) 25 MG tablet Take 1 tablet (25 mg total) by mouth daily. Patient not taking: Reported on 08/03/2021 02/20/20   Lavera Guise, MD     Vital Signs: BP 137/73 (BP Location: Left Arm)   Pulse (!) 113   Temp 98.1 F (36.7 C)   Resp (!) 25   Ht 5' 7"  (1.702 m)   Wt 185 lb (83.9 kg)   SpO2 97%   BMI 28.97 kg/m   Physical Exam Vitals reviewed.  Constitutional:      General: He is not in acute distress.    Appearance: He is ill-appearing.  HENT:     Head: Normocephalic and atraumatic.  Cardiovascular:     Rate and Rhythm: Tachycardia present.  Pulmonary:      Comments: Pt mechanically ventilated  Skin:    General: Skin is warm and dry.     Comments: Site is soft with no active bleeding and no appreciable pseudoaneurysm. Dressing has small area of pink colored fluid, intact.    Neurological:     Comments: Follows some commands.  Pt opens eyes with verbal/tactile stimulation. Sticks out tongue on command which is midline. Unable to wiggle toes or move fingers on command. Distal pulses (DP's) palpable            Imaging: DG Chest Port 1 View  Result Date: 08/04/2021 CLINICAL DATA:  Intubated EXAM: PORTABLE CHEST 1 VIEW COMPARISON:  08/04/2021 FINDINGS: Endotracheal tube tip is about 4.7 cm superior to the carina. Subsegmental atelectasis at the left base. Cardiomegaly. No consolidation, pleural effusion or pneumothorax IMPRESSION: 1. Endotracheal tube tip about 4.7 cm superior to the carina. 2. Cardiomegaly.  Subsegmental atelectasis at the left base. Electronically Signed   By: Donavan Foil M.D.   On: 08/04/2021 15:10   DG Chest Port 1 View  Result Date: 08/04/2021 CLINICAL DATA:  Endotracheal intubation EXAM: PORTABLE CHEST 1 VIEW COMPARISON:  Earlier same day FINDINGS: Endotracheal tube tip 6 cm above the carina. Cardiomegaly persists. Pulmonary venous hypertension, possibly with early interstitial edema. Right lateral chest not included on the image. No evidence of collapse or  effusion. IMPRESSION: Endotracheal tube tip 6 cm above the carina. Electronically Signed   By: Nelson Chimes M.D.   On: 08/04/2021 13:42   ECHOCARDIOGRAM COMPLETE  Result Date: 08/04/2021    ECHOCARDIOGRAM REPORT   Patient Name:   Joshua Donovan Date of Exam: 08/03/2021 Medical Rec #:  [id number]   Height:       67.0 in Accession #:    [id number]  Weight:       185.0 lb Date of Birth:  1955/05/02   BSA:          1.956 m Patient Age:    66 years    BP:           142/79 mmHg Patient Gender: M           HR:           76 bpm. Exam Location:  [facility] Procedure: 2D Echo, Cardiac  Doppler and Color Doppler Indications:     I63.9 Stroke  History:         Patient has no prior history of Echocardiogram examinations.                  Risk Factors:Diabetes, Dyslipidemia, Hypertension and Former                  Smoker.  Sonographer:     Rosalia Hammers Referring Phys:  [id number] Athena Masse Diagnosing Phys: [place]  1. Left ventricular ejection fraction, by estimation, is 60 to 65%. The left ventricle has normal function. The left ventricle has no regional wall motion abnormalities. There is moderate concentric left ventricular hypertrophy. Left ventricular diastolic parameters are consistent with Grade I diastolic dysfunction (impaired relaxation).  2. Right ventricular systolic function is normal. The right ventricular size is normal.  3. The mitral valve is normal in structure. Trivial mitral valve regurgitation. No evidence of mitral stenosis.  4. The aortic valve is normal in structure. Aortic valve regurgitation is mild. No aortic stenosis is present.  5. The inferior vena cava is normal in size with greater than 50% respiratory variability, suggesting right atrial pressure of 3 mmHg. FINDINGS  Left Ventricle: Left ventricular ejection fraction, by estimation, is 60 to 65%. The left ventricle has normal function. The left ventricle has no regional wall motion abnormalities. The left ventricular internal cavity size was normal in size. There is  moderate concentric left ventricular hypertrophy. Left ventricular diastolic parameters are consistent with Grade I diastolic dysfunction (impaired relaxation). Right Ventricle: The right ventricular size is normal. No increase in right ventricular wall thickness. Right ventricular systolic function is normal. Left Atrium: Left atrial size was normal in size. Right Atrium: Right atrial size was normal in size. Pericardium: There is no evidence of pericardial effusion. Mitral Valve: The mitral valve is normal in structure. Trivial  mitral valve regurgitation. No evidence of mitral valve stenosis. Tricuspid Valve: The tricuspid valve is normal in structure. Tricuspid valve regurgitation is trivial. No evidence of tricuspid stenosis. Aortic Valve: The aortic valve is normal in structure. Aortic valve regurgitation is mild. Aortic regurgitation PHT measures 1577 msec. No aortic stenosis is present. Aortic valve mean gradient measures 5.0 mmHg. Aortic valve peak gradient measures 9.1 mmHg. Aortic valve area, by VTI measures 2.30 cm. Pulmonic Valve: The pulmonic valve was normal in structure. Pulmonic valve regurgitation is not visualized. No evidence of pulmonic stenosis. Aorta: The aortic root is normal in size and structure. Venous: The inferior vena cava is normal in  size with greater than 50% respiratory variability, suggesting right atrial pressure of 3 mmHg. IAS/Shunts: No atrial level shunt detected by color flow Doppler.  LEFT VENTRICLE PLAX 2D LVIDd:         3.84 cm   Diastology LVIDs:         2.17 cm   LV e' medial:    6.39 cm/s LV PW:         1.65 cm   LV E/e' medial:  9.6 LV IVS:        1.18 cm   LV e' lateral:   7.83 cm/s LVOT diam:     1.90 cm   LV E/e' lateral: 7.8 LV SV:         74 LV SV Index:   38 LVOT Area:     2.84 cm  RIGHT VENTRICLE RV Basal diam:  3.15 cm RV S prime:     17.40 cm/s TAPSE (M-mode): 2.4 cm LEFT ATRIUM             Index        RIGHT ATRIUM           Index LA diam:        3.20 cm 1.64 cm/m   RA Area:     18.10 cm LA Vol (A2C):   72.4 ml 37.01 ml/m  RA Volume:   47.10 ml  24.08 ml/m LA Vol (A4C):   65.8 ml 33.63 ml/m LA Biplane Vol: 69.3 ml 35.42 ml/m  AORTIC VALVE AV Area (Vmax):    2.10 cm AV Area (Vmean):   2.06 cm AV Area (VTI):     2.30 cm AV Vmax:           151.00 cm/s AV Vmean:          100.000 cm/s AV VTI:            0.323 m AV Peak Grad:      9.1 mmHg AV Mean Grad:      5.0 mmHg LVOT Vmax:         112.00 cm/s LVOT Vmean:        72.600 cm/s LVOT VTI:          0.262 m LVOT/AV VTI ratio: 0.81 AI  PHT:            1577 msec  AORTA Ao Root diam: 3.20 cm MITRAL VALVE MV Area (PHT): 3.20 cm     SHUNTS MV Decel Time: 237 msec     Systemic VTI:  0.26 m MV E velocity: 61.30 cm/s   Systemic Diam: 1.90 cm MV A velocity: 101.00 cm/s MV E/A ratio:  0.61 Shaukat Khan Electronically signed by Neoma Laming Signature Date/Time: 08/04/2021/8:55:17 AM    Final    MR BRAIN WO CONTRAST  Result Date: 08/03/2021 CLINICAL DATA:  66 year old male with severe intracranial atherosclerosis and questionable medullary ischemia on brain MRI yesterday. EXAM: MRI HEAD WITHOUT CONTRAST TECHNIQUE: Multiplanar, multiecho pulse sequences of the brain and surrounding structures were obtained without intravenous contrast. COMPARISON:  CTA head and neck this morning.  Brain MRI yesterday. FINDINGS: Brain: More convincing appearance of ventral medullary restricted diffusion, asymmetrically greater to the left of midline. See series 5, image 12, series 7, images 18 and 19. Mild if any associated T2 and FLAIR hyperintensity there. And no other convincing diffusion restriction despite the CTA findings earlier today. 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 Chronic encephalomalacia right inferior frontal  gyrus with hemosiderin. Occasional chronic microhemorrhages elsewhere, right cerebellum (series 13, image 19). No new signal abnormality compared to yesterday. No midline shift, mass effect, evidence of mass lesion, ventriculomegaly, extra-axial collection or acute intracranial hemorrhage. Cervicomedullary junction and pituitary are within normal limits. Vascular: Major intracranial vascular flow voids are stable from yesterday. See also CTA today reported separately. Skull and upper cervical spine: Negative. Visualized bone marrow signal is within normal limits. Sinuses/Orbits: Stable, negative. Other: Visible internal auditory structures appear normal. Mastoids are clear. Negative  visible scalp and face. IMPRESSION: 1. Positive for ischemia/infarction of the ventral Medulla as suspected yesterday, slightly greater on the left. No associated hemorrhage or mass effect. 2. 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 These results were communicated to Dr. Rory Percy at 11:06 am on 08/03/2021 by text page via the [facility] messaging system. Electronically Signed   By: Genevie Ann M.D.   On: 08/03/2021 11:06   CT ANGIO HEAD NECK W WO CM  Addendum Date: 08/03/2021   ADDENDUM REPORT: 08/03/2021 09:17 ADDENDUM: Study discussed by telephone with Dr. Amie Portland on 08/03/2021 at 0911 hours. Electronically Signed   By: Genevie Ann M.D.   On: 08/03/2021 09:17   Result Date: 08/03/2021 CLINICAL DATA:  66 year old male with neurologic deficit. Questionable small vessel ischemia of the medulla on brain MRI yesterday. EXAM: CT ANGIOGRAPHY HEAD AND NECK TECHNIQUE: Multidetector CT imaging of the head and neck was performed using the standard protocol during bolus administration of intravenous contrast. Multiplanar CT image reconstructions and MIPs were obtained to evaluate the vascular anatomy. Carotid stenosis measurements (when applicable) are obtained utilizing NASCET criteria, using the distal internal carotid diameter as the denominator. RADIATION DOSE REDUCTION: This exam was performed according to the departmental dose-optimization program which includes automated exposure control, adjustment of the mA and/or kV according to patient size and/or use of iterative reconstruction technique. CONTRAST:  61mL OMNIPAQUE IOHEXOL 350 MG/ML SOLN COMPARISON:  Brain MRI yesterday.  Head CT yesterday. FINDINGS: CT HEAD Brain: Bulky Calcified atherosclerosis at the skull base. Stable CT appearance of the brainstem since yesterday. No acute intracranial hemorrhage identified. No midline shift,  mass effect, or evidence of intracranial mass lesion. Chronic right inferior frontal gyrus encephalomalacia, patchy bilateral cerebral white matter hypodensity more pronounced on the left. 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 No ventriculomegaly or acute cortically based infarct. Calvarium and skull base: No acute osseous abnormality identified. Paranasal sinuses: Visualized paranasal sinuses and mastoids are stable and well aerated. Orbits: Visualized orbits and scalp soft tissues are within normal limits. CTA NECK Skeleton: Periapical dental lucency bilaterally. Ordinary cervical spine degeneration. No acute osseous abnormality identified. Upper chest: Centrilobular and paraseptal emphysema. No superior mediastinal lymphadenopathy. Other neck: 15 mm hypodense right thyroid nodule. Recommend thyroid US (ref: J Am Coll Radiol. 2015 Feb;12(2): 143-50).Dermal and subcutaneous probable 2.6 cm sebaceous cyst of the left submandibular face (series 13, image 79). Otherwise negative CT appearance of the neck soft tissues. Aortic arch: 3 vessel arch configuration. Mild arch atherosclerosis. Right carotid system: Negative brachiocephalic artery and proximal right CCA. Retropharyngeal course of the right carotid bifurcation with mild calcified plaque. Tortuous right ICA distal to the bulb, no stenosis. Left carotid system: Similar mild tortuosity and atherosclerosis with no hemodynamically significant stenosis. Vertebral arteries: Tortuous proximal right subclavian artery with a mildly kinked appearance in the superior mediastinum. Normal right vertebral artery origin. Patent  right vertebral artery to the skull base with no significant plaque or stenosis. Mild to moderate soft and calcified plaque in the proximal left subclavian artery with less than 50 % stenosis with respect to the distal vessel. Mild atherosclerosis and stenosis at the left vertebral artery origin on series 13, image 159. Non  dominant left vertebral artery than is patent to the skull base with no additional plaque or stenosis. CTA HEAD Posterior circulation: Bilateral V4 segment calcified plaque. Severe stenosis on the left (series 12, image 139) upstream of the left PICA origin which remains patent. But the left V4 segment is occluded distal to the left PICA. Contralateral right V4 moderate tandem stenoses upstream of the patent right PICA origin on series 12, image 130. And severe stenosis, near occlusion of the right vertebrobasilar junction (series 16, image 26). The basilar artery remains patent but is moderately stenotic in the mid basilar segment on series 16, image 24. Distal basilar remains patent along with SCA and PCA origins. Left posterior communicating artery is present, the right is diminutive or absent. a mild to moderate stenosis left PCA origin. And bilateral P2 segment severe stenosis, near occlusion on series 15, image 53. But there is preserved distal PCA branch enhancement bilaterally. Anterior circulation: Both ICA siphons are patent. However, on the left there is bulky soft plaque or thrombus in the supraclinoid ICA (series 14, image 162) this is between normal appearing left ophthalmic and posterior communicating artery origins. Left carotid terminus remains patent. Contralateral right ICA siphon with mild to moderate calcified plaque and mild supraclinoid stenosis. Patent carotid termini, MCA and ACA origins. Tortuous A1 segments. Normal anterior communicating artery. Proximal A2 segments are normal. There is severe right ACA distal A2 segment stenosis, near occlusion on series 17, image 32. Left MCA M1 segment is mildly irregular. Left MCA bifurcation is patent without stenosis. Left MCA branches are mildly irregular. Mild to moderate somewhat long segment right MCA M1 stenosis along 6 mm series 16, image 20. Distal M1 and right MCA trifurcation remain patent. Right MCA branches are mildly irregular. Venous  sinuses: Early contrast timing, grossly patent. Anatomic variants: Mildly dominant right vertebral artery. Review of the MIP images confirms the above findings IMPRESSION: 1. 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 - Moderate stenosis Mid Basilar Artery. - Near Occlusion bilateral PCA P2 segments, distal right ACA A2. - bulky soft plaque or thrombus in the Supraclinoid Left ICA, with up to Moderate stenosis. - mild-to-moderate Right MCA M1 stenosis. 2. Stable CT appearance of the brain since yesterday. No new intracranial abnormality. 3. Aortic Atherosclerosis (ICD10-I70.0) and Emphysema (ICD10-J43.9). Electronically Signed: By: Genevie Ann M.D. On: 08/03/2021 09:01   US Carotid Bilateral (at [facility] and [facility] only)  Result Date: 08/03/2021 CLINICAL DATA:  Follow-up examination for stroke. EXAM: BILATERAL CAROTID DUPLEX ULTRASOUND TECHNIQUE: Pearline Cables scale imaging, color Doppler and duplex ultrasound were performed of bilateral carotid and vertebral arteries in the neck. COMPARISON:  Prior Study from 04/03/2014. FINDINGS: Criteria: Quantification of carotid stenosis is based on velocity parameters that correlate the residual internal carotid diameter with NASCET-based stenosis levels, using the diameter of the distal internal carotid lumen as the denominator for stenosis measurement. The following velocity measurements were obtained: RIGHT ICA: 84/10 cm/sec CCA: 91/63 cm/sec SYSTOLIC ICA/CCA RATIO:  1.1 ECA: 64 cm/sec LEFT ICA: 64/16 cm/sec CCA: 846/65 cm/sec SYSTOLIC ICA/CCA RATIO:  0.9 ECA: 117  cm/sec RIGHT CAROTID ARTERY: Mild smooth intimal thickening seen within the visualized right CCA without significant stenosis. Mild heterogeneous mural based echogenic plaque seen about the right carotid bulb. No significant elevation in peak systolic velocity to suggest hemodynamically  significant greater than 50% stenosis. Visualized right ICA patent distally without stenosis. RIGHT VERTEBRAL ARTERY:  Patent with antegrade flow. LEFT CAROTID ARTERY: Diffuse smooth intimal thickening seen within the visualized left CCA without significant stenosis. Mild scattered heterogeneous echogenic plaque about the left carotid bulb/proximal left ICA. No significant elevation in peak systolic velocity to suggest hemodynamically significant greater than 50% stenosis. Visualized left ICA patent distally without visible stenosis. LEFT VERTEBRAL ARTERY:  Patent with antegrade flow. IMPRESSION: 1. Mild atherosclerotic plaque within the carotid bulbs and proximal ICAs bilaterally, but no hemodynamically significant greater than 50% stenosis. 2. Patent antegrade flow within both vertebral arteries. Electronically Signed   By: Jeannine Boga M.D.   On: 08/03/2021 03:26   MR Cervical Spine Wo Contrast  Result Date: 08/03/2021 CLINICAL DATA:  Initial evaluation for ataxia, right-sided numbness. EXAM: MRI CERVICAL SPINE WITHOUT CONTRAST TECHNIQUE: Multiplanar, multisequence MR imaging of the cervical spine was performed. No intravenous contrast was administered. COMPARISON:  None available. FINDINGS: Alignment: Examination degraded by motion artifact. Straightening with mild reversal of the normal cervical lordosis. No listhesis. Vertebrae: Vertebral body height maintained without acute or chronic fracture. Bone marrow signal intensity within normal limits. No discrete or worrisome osseous lesions or abnormal marrow edema. Cord: Normal signal and morphology. No convincing cord signal changes on this motion degraded exam. Posterior Fossa, vertebral arteries, paraspinal tissues: Unremarkable. Disc levels: C2-C3: Unremarkable. C3-C4: Mild disc bulge with endplate and uncovertebral spurring. Flattening and partial effacement of the ventral thecal sac with resultant mild spinal stenosis. Moderate bilateral C4  foraminal narrowing. C4-C5: Mild disc bulge with uncovertebral spurring. Mild flattening of the ventral thecal sac without significant spinal stenosis. Moderate right with mild left C5 foraminal stenosis. C5-C6: Minimal disc bulge with uncovertebral spurring. No spinal stenosis. Mild right C6 foraminal narrowing. Left neural foramen remains patent. C6-C7: Minimal annular disc bulge. No spinal stenosis. Foramina remain patent. C7-T1:  Unremarkable. Visualized upper thoracic spine demonstrates no significant finding. IMPRESSION: 1. Motion degraded exam. 2. No acute abnormality within the cervical spine. 3. Mild multilevel cervical spondylosis with resultant mild spinal stenosis at C3-4. Associated moderate bilateral C4 foraminal stenosis, with mild right C5 and C6 foraminal narrowing. Electronically Signed   By: Jeannine Boga M.D.   On: 08/03/2021 01:04   MR BRAIN WO CONTRAST  Result Date: 08/03/2021 CLINICAL DATA:  Initial evaluation for neuro deficit, stroke suspected. EXAM: MRI HEAD WITHOUT CONTRAST TECHNIQUE: Multiplanar, multiecho pulse sequences of the brain and surrounding structures were obtained without intravenous contrast. COMPARISON:  Prior CT from earlier the same day as well as previous MRI from 09/17/2015. FINDINGS: Brain: Generalized age-related cerebral atrophy. Patchy T2/FLAIR hyperintensity involving the periventricular and deep white matter both cerebral hemispheres as well as the pons, most consistent with chronic small vessel ischemic disease, moderately advanced in nature. Area of encephalomalacia of involving parasagittal anterior/inferior right frontal lobe noted at site of prior hemorrhage. Additional smaller foci of encephalomalacia involving the inferior temporal poles bilaterally likely posttraumatic in nature. Few scattered small remote left cerebellar infarcts noted. Subtle area of diffusion signal abnormality involving the ventral medulla measuring up to approximately 8 mm is  seen (a series 5, image 12). While this finding could potentially be artifactual, possibly due to prominent vascular calcifications about the  adjacent vertebral arteries, a possible small acute ischemic infarct could be considered. No associated hemorrhage or mass effect. No other evidence for acute or subacute ischemia. No acute intracranial hemorrhage. Single chronic microhemorrhage noted within the right cerebellum, of doubtful significance in isolation. No mass lesion, midline shift or mass effect. No hydrocephalus or extra-axial fluid collection. Pituitary gland and suprasellar region within normal limits. Vascular: Major intracranial vascular flow voids are maintained. Skull and upper cervical spine: Craniocervical junction within normal limits. Bone marrow signal intensity normal. No scalp soft tissue abnormality. Sinuses/Orbits: Globes and orbital soft tissues within normal limits. Paranasal sinuses are largely clear. No mastoid effusion. Other: None. IMPRESSION: 1. 8 mm focus of diffusion signal abnormality involving the ventral medulla, indeterminate. While this finding could potentially be artifactual in nature, a possible small acute ischemic infarct is difficult to exclude, and could be considered in the correct clinical setting. No associated hemorrhage or mass effect. 2. No other acute intracranial abnormality. 3. Underlying atrophy with moderate chronic small vessel ischemic disease with a few small remote left cerebellar infarcts. Area of encephalomalacia of involving the anterior/inferior right frontal lobe at site of previous hemorrhage. Electronically Signed   By: Jeannine Boga M.D.   On: 08/03/2021 00:59   CT Head Wo Contrast  Result Date: 08/02/2021 CLINICAL DATA:  New right upper extremity tremor. Tingling to the arm. EXAM: CT HEAD WITHOUT CONTRAST TECHNIQUE: Contiguous axial images were obtained from the base of the skull through the vertex without intravenous contrast. RADIATION  DOSE REDUCTION: This exam was performed according to the departmental dose-optimization program which includes automated exposure control, adjustment of the mA and/or kV according to patient size and/or use of iterative reconstruction technique. COMPARISON:  MRI brain 09/17/2015 FINDINGS: Brain: Mild cerebral atrophy. Mild ventricular dilatation consistent with central atrophy. Patchy low-attenuation changes in the deep white matter consistent small vessel ischemic change. Old area of encephalomalacia in the right anterior frontal lobe corresponding to previous intraparenchymal hemorrhage on prior MRI. No acute mass effect or midline shift. No abnormal extra-axial fluid collections. Gray-white matter junctions are distinct. Basal cisterns are not effaced. No acute intracranial hemorrhage. Vascular: No hyperdense vessel or unexpected calcification. Skull: Normal. Negative for fracture or focal lesion. Sinuses/Orbits: Paranasal sinuses and mastoid air cells are clear. Other: None. IMPRESSION: No acute intracranial abnormalities. Chronic atrophy and small vessel ischemic changes. 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 Electronically Signed   By: Lucienne Capers M.D.   On: 08/02/2021 21:20   DG Foot Complete Right  Result Date: 08/02/2021 CLINICAL DATA:  Chronic RIGHT arm and leg pain for since Sunday, RIGHT leg cold to touch, swelling RIGHT great toe EXAM: RIGHT FOOT COMPLETE - 3+ VIEW COMPARISON:  None Available. FINDINGS: Osseous mineralization normal. 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 Remaining joint spaces preserved. No acute fracture, dislocation, or bone destruction. Small plantar calcaneal spur. IMPRESSION: Degenerative changes first MTP joint and small plantar calcaneal spur. No acute osseous abnormalities. Electronically Signed   By: Lavonia Dana M.D.   On: 08/02/2021 20:51    Labs:  CBC: Recent Labs    08/02/21 2027  WBC 6.4  HGB 14.2  HCT 43.0   PLT 296    COAGS: Recent Labs    08/03/21 1129  APTT 28    BMP: Recent Labs    08/02/21 2027 08/03/21 0347  NA 136 136  K 3.1* 3.4*  CL 101 101  CO2 25 24  GLUCOSE 182* 212*  BUN 14 11  CALCIUM 9.9 9.0  CREATININE 0.70 0.76  GFRNONAA >60 >60    LIVER FUNCTION TESTS: Recent Labs    08/02/21 2027  BILITOT 0.5  AST 26  ALT 28  ALKPHOS 29*  PROT 7.3  ALBUMIN 4.2    Assessment and Plan:  -Vertebrobasilar stenosis with stoke.  -S/p stent placement of R vertebrobasilar junction.  -Failed extubation, re-intubated shortly after. -Pt transferred to NICU. -Pt able to open eyes and stick out tongue, does not attempt to move extremities.   Neuro to follow.  Call with questions/concerns.   Electronically Signed: Tyson Alias, NP 08/04/2021, 3:57 PM   I spent a total of 15 Minutes at the the patient's bedside AND on the patient's hospital floor or unit, greater than 50% of which was counseling/coordinating care for vertebrobasilar stenosis with stroke.

## 2021-08-04 NOTE — Anesthesia Procedure Notes (Signed)
Procedure Name: Intubation Date/Time: 08/04/2021 2:15 PM  Performed by: Barrington Ellison, CRNAPre-anesthesia Checklist: Patient identified, Emergency Drugs available, Suction available and Patient being monitored Oxygen Delivery Method: Ambu bag Preoxygenation: Pre-oxygenation with 100% oxygen Induction Type: IV induction and Rapid sequence Ventilation: Two handed mask ventilation required Laryngoscope Size: Glidescope and 4 Grade View: Grade I Tube type: Oral Tube size: 7.5 mm Number of attempts: 1 Airway Equipment and Method: Stylet and Video-laryngoscopy Placement Confirmation: ETT inserted through vocal cords under direct vision, positive ETCO2 and breath sounds checked- equal and bilateral Secured at: 21 cm Tube secured with: Tape Dental Injury: Teeth and Oropharynx as per pre-operative assessment  Comments: Re-intubation in PACU following failed extubation trial Dr Christella Hartigan and Critical care team at bedside

## 2021-08-04 NOTE — Progress Notes (Signed)
  Transition of Care [facility]) Screening Note   Patient Details  Name: Joshua Donovan Date of Birth: 22-Jul-1955   Transition of Care [facility]) CM/SW Contact:    Benard Halsted, LCSW Phone Number: 08/04/2021, 6:39 PM    Transition of Care Department [facility]) has reviewed patient who is currently intubated. We will continue to monitor patient advancement through interdisciplinary progression rounds. If new patient transition needs arise, please place a TOC consult.

## 2021-08-04 NOTE — Sedation Documentation (Signed)
Pt transported to PACU with this RN, CRNA Zach, Dr. Gerhard Perches and Phebe Colla. Pt transported while intubated. On arrival to PACU, this RN gave report to Andee Poles, Therapist, sports. Right puncture/groin site assessed. No bleeding or hematoma present. DP and PT pulses to the right foot also present at hand off of care. All questions answered prior to this RN's departure.

## 2021-08-04 NOTE — Progress Notes (Signed)
PT Cancellation Note  Patient Details Name: Jordie Schreur MRN: [id number] DOB: 1955-10-15   Cancelled Treatment:    Reason Eval/Treat Not Completed: Patient at procedure or test/unavailable. Pt leaving for IR for stent placement. PT to return when appropriate, as able to complete PT eval.  Kittie Plater, PT, DPT Acute Rehabilitation Services Secure chat preferred Office #: [phone]    Berline Lopes 08/04/2021, 7:58 AM

## 2021-08-04 NOTE — Progress Notes (Signed)
eLink Physician-Brief Progress Note Patient Name: Joshua Donovan DOB: 09-01-1955 MRN: [id number]   Date of Service  08/04/2021  HPI/Events of Note  Nursing request to review abdominal film from 5 PM for OGT placement - Tip of gastric tube is seen in the region of the antrum of the stomach.  eICU Interventions  OK to use gastric tube.      Intervention Category Major Interventions: Other:  Lysle Dingwall 08/04/2021, 10:06 PM

## 2021-08-04 NOTE — Transfer of Care (Signed)
Immediate Anesthesia Transfer of Care Note  Patient: Lukasz Rogus  Procedure(s) Performed: Angioplasty/stenting of vertebrobasilar stenosis  Patient Location: PACU  Anesthesia Type:General  Level of Consciousness: sedated and drowsy  Airway & Oxygen Therapy: Patient remains intubated per anesthesia plan and Patient placed on Ventilator (see vital sign flow sheet for setting)  Post-op Assessment: Report given to RN and Post -op Vital signs reviewed and stable  Post vital signs: Reviewed and stable  Last Vitals:  Vitals Value Taken Time  BP 150/83 08/04/21 1315  Temp    Pulse 99 08/04/21 1317  Resp 28 08/04/21 1317  SpO2 97 % 08/04/21 1317  Vitals shown include unvalidated device data.  Last Pain:  Vitals:   08/04/21 0810  TempSrc:   PainSc: 0-No pain         Complications: No notable events documented.

## 2021-08-04 NOTE — Progress Notes (Signed)
[facility] PROGRESS NOTE    Progress Note  [place]  TUU:[id number] DOB: 07/26/1955 DOA: 08/03/2021 PCP: [facility]     Brief Narrative:   Fitzroy Mikami is an 66 y.o. male admitted to [facility] on 08/03/2021 for right arm and right leg weakness work-up showed acute stroke to the ventral medulla, repeated MRI of the brain showed infarction of the ventral medulla neurology was consulted recommended a CTA of head and neck that showed severe intracranial atherosclerosis near the vertebrobasilar  junction, case discussed with interventional radiology recommended transfer to [facility] undergo an angiogram for possible stenting.  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 08/03/2021 CT of head and neck negative for large vessel occlusion but showed severe intracranial stenosis and occlusion at the vertebrobasilar junction moderate stenosis of the mid basilar artery and near bilateral occlusion of PCA P2 segment. Carotid Doppler showed bilateral proximal ICA stenosis about 50%. The stroke is most likely secondary to intracranial atherosclerosis Neurology was consulted recommended to load with aspirin and Brilinta and stroke team to reevaluate. IR was consulted recommended angiogram for diagnostic and therapeutic purposes. PT OT has been consulted. Keep n.p.o. for possible angiogram on 08/04/2021.  Alcohol abuse: Continue monitor with CIWA protocol.  Mixed hyperlipidemia: continue high-dose statins.  Uncontrolled diabetes mellitus type 2 with hyperglycemia: A1c of 8.3 continue long-acting insulin plus sliding scale CBGs every 4 as he is n.p.o.  Essential hypertension: Allow permissive hypertension.     DVT prophylaxis: lovenox Family Communication:daughter Status is: Inpatient Remains inpatient appropriate because: Acute CVA    Code Status:      Code Status Orders  (From admission, onward)           Start     Ordered   08/03/21 2311  Full code  Continuous        08/03/21 2310           Code Status History     Date Active Date Inactive Code Status Order ID Comments User Context   08/03/2021 0018 08/03/2021 2142 Full Code [id number]  Athena Masse, MD ED   07/19/2015 2030 07/21/2015 1550 Full Code [id number]  Theodoro Grist, MD Inpatient         IV Access:   Peripheral IV   Procedures and diagnostic studies:   MR BRAIN WO CONTRAST  Result Date: 08/03/2021 CLINICAL DATA:  66 year old male with severe intracranial atherosclerosis and questionable medullary ischemia on brain MRI yesterday. EXAM: MRI HEAD WITHOUT CONTRAST TECHNIQUE: Multiplanar, multiecho pulse sequences of the brain and surrounding structures were obtained without intravenous contrast. COMPARISON:  CTA head and neck this morning.  Brain MRI yesterday. FINDINGS: Brain: More convincing appearance of ventral medullary restricted diffusion, asymmetrically greater to the left of midline. See series 5, image 12, series 7, images 18 and 19. Mild if any associated T2 and FLAIR hyperintensity there. And no other convincing diffusion restriction despite the CTA findings earlier today. 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 Chronic encephalomalacia right inferior frontal gyrus with hemosiderin. Occasional chronic microhemorrhages elsewhere, right cerebellum (series 13, image 19). No new signal abnormality compared to yesterday. No midline shift, mass effect, evidence of mass lesion, ventriculomegaly, extra-axial collection or acute intracranial hemorrhage. Cervicomedullary junction and pituitary are within normal limits. Vascular: Major intracranial vascular flow voids are stable from yesterday. See also CTA today reported separately.  Skull and upper cervical spine: Negative. Visualized bone marrow signal is  within normal limits. Sinuses/Orbits: Stable, negative. Other: Visible internal auditory structures appear normal. Mastoids are clear. Negative visible scalp and face. IMPRESSION: 1. Positive for ischemia/infarction of the ventral Medulla as suspected yesterday, slightly greater on the left. No associated hemorrhage or mass effect. 2. 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 These results were communicated to Dr. Rory Percy at 11:06 am on 08/03/2021 by text page via the [facility] messaging system. Electronically Signed   By: Genevie Ann M.D.   On: 08/03/2021 11:06   CT ANGIO HEAD NECK W WO CM  Addendum Date: 08/03/2021   ADDENDUM REPORT: 08/03/2021 09:17 ADDENDUM: Study discussed by telephone with Dr. Amie Portland on 08/03/2021 at 0911 hours. Electronically Signed   By: Genevie Ann M.D.   On: 08/03/2021 09:17   Result Date: 08/03/2021 CLINICAL DATA:  66 year old male with neurologic deficit. Questionable small vessel ischemia of the medulla on brain MRI yesterday. EXAM: CT ANGIOGRAPHY HEAD AND NECK TECHNIQUE: Multidetector CT imaging of the head and neck was performed using the standard protocol during bolus administration of intravenous contrast. Multiplanar CT image reconstructions and MIPs were obtained to evaluate the vascular anatomy. Carotid stenosis measurements (when applicable) are obtained utilizing NASCET criteria, using the distal internal carotid diameter as the denominator. RADIATION DOSE REDUCTION: This exam was performed according to the departmental dose-optimization program which includes automated exposure control, adjustment of the mA and/or kV according to patient size and/or use of iterative reconstruction technique. CONTRAST:  65m OMNIPAQUE IOHEXOL 350 MG/ML SOLN COMPARISON:  Brain MRI yesterday.  Head CT yesterday. FINDINGS: CT HEAD Brain: Bulky Calcified  atherosclerosis at the skull base. Stable CT appearance of the brainstem since yesterday. No acute intracranial hemorrhage identified. No midline shift, mass effect, or evidence of intracranial mass lesion. Chronic right inferior frontal gyrus encephalomalacia, patchy bilateral cerebral white matter hypodensity more pronounced on the left. 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 No ventriculomegaly or acute cortically based infarct. Calvarium and skull base: No acute osseous abnormality identified. Paranasal sinuses: Visualized paranasal sinuses and mastoids are stable and well aerated. Orbits: Visualized orbits and scalp soft tissues are within normal limits. CTA NECK Skeleton: Periapical dental lucency bilaterally. Ordinary cervical spine degeneration. No acute osseous abnormality identified. Upper chest: Centrilobular and paraseptal emphysema. No superior mediastinal lymphadenopathy. Other neck: 15 mm hypodense right thyroid nodule. Recommend thyroid [place](ref: J Am Coll Radiol. 2015 Feb;12(2): 143-50).Dermal and subcutaneous probable 2.6 cm sebaceous cyst of the left submandibular face (series 13, image 79). Otherwise negative CT appearance of the neck soft tissues. Aortic arch: 3 vessel arch configuration. Mild arch atherosclerosis. Right carotid system: Negative brachiocephalic artery and proximal right CCA. Retropharyngeal course of the right carotid bifurcation with mild calcified plaque. Tortuous right ICA distal to the bulb, no stenosis. Left carotid system: Similar mild tortuosity and atherosclerosis with no hemodynamically significant stenosis. Vertebral arteries: Tortuous proximal right subclavian artery with a mildly kinked appearance in the superior mediastinum. Normal right vertebral artery origin. Patent right vertebral artery to the skull base with no significant plaque or stenosis. Mild to moderate soft and calcified plaque in the proximal left subclavian artery with less than  50 % stenosis with respect to the distal vessel. Mild atherosclerosis and stenosis at the left vertebral artery origin on series 13, image 159. Non dominant left vertebral artery than  is patent to the skull base with no additional plaque or stenosis. CTA HEAD Posterior circulation: Bilateral V4 segment calcified plaque. Severe stenosis on the left (series 12, image 139) upstream of the left PICA origin which remains patent. But the left V4 segment is occluded distal to the left PICA. Contralateral right V4 moderate tandem stenoses upstream of the patent right PICA origin on series 12, image 130. And severe stenosis, near occlusion of the right vertebrobasilar junction (series 16, image 26). The basilar artery remains patent but is moderately stenotic in the mid basilar segment on series 16, image 24. Distal basilar remains patent along with SCA and PCA origins. Left posterior communicating artery is present, the right is diminutive or absent. a mild to moderate stenosis left PCA origin. And bilateral P2 segment severe stenosis, near occlusion on series 15, image 53. But there is preserved distal PCA branch enhancement bilaterally. Anterior circulation: Both ICA siphons are patent. However, on the left there is bulky soft plaque or thrombus in the supraclinoid ICA (series 14, image 162) this is between normal appearing left ophthalmic and posterior communicating artery origins. Left carotid terminus remains patent. Contralateral right ICA siphon with mild to moderate calcified plaque and mild supraclinoid stenosis. Patent carotid termini, MCA and ACA origins. Tortuous A1 segments. Normal anterior communicating artery. Proximal A2 segments are normal. There is severe right ACA distal A2 segment stenosis, near occlusion on series 17, image 32. Left MCA M1 segment is mildly irregular. Left MCA bifurcation is patent without stenosis. Left MCA branches are mildly irregular. Mild to moderate somewhat long segment right MCA  M1 stenosis along 6 mm series 16, image 20. Distal M1 and right MCA trifurcation remain patent. Right MCA branches are mildly irregular. Venous sinuses: Early contrast timing, grossly patent. Anatomic variants: Mildly dominant right vertebral artery. Review of the MIP images confirms the above findings IMPRESSION: 1. 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 - Moderate stenosis Mid Basilar Artery. - Near Occlusion bilateral PCA P2 segments, distal right ACA A2. - bulky soft plaque or thrombus in the Supraclinoid Left ICA, with up to Moderate stenosis. - mild-to-moderate Right MCA M1 stenosis. 2. Stable CT appearance of the brain since yesterday. No new intracranial abnormality. 3. Aortic Atherosclerosis (ICD10-I70.0) and Emphysema (ICD10-J43.9). Electronically Signed: By: Genevie Ann M.D. On: 08/03/2021 09:01   US Carotid Bilateral (at [facility] and [facility] only)  Result Date: 08/03/2021 CLINICAL DATA:  Follow-up examination for stroke. EXAM: BILATERAL CAROTID DUPLEX ULTRASOUND TECHNIQUE: Pearline Cables scale imaging, color Doppler and duplex ultrasound were performed of bilateral carotid and vertebral arteries in the neck. COMPARISON:  Prior Study from 04/03/2014. FINDINGS: Criteria: Quantification of carotid stenosis is based on velocity parameters that correlate the residual internal carotid diameter with NASCET-based stenosis levels, using the diameter of the distal internal carotid lumen as the denominator for stenosis measurement. The following velocity measurements were obtained: RIGHT ICA: 84/10 cm/sec CCA: 44/81 cm/sec SYSTOLIC ICA/CCA RATIO:  1.1 ECA: 64 cm/sec LEFT ICA: 64/16 cm/sec CCA: 856/31 cm/sec SYSTOLIC ICA/CCA RATIO:  0.9 ECA: 117 cm/sec RIGHT CAROTID ARTERY: Mild smooth intimal thickening seen within the visualized right CCA without significant stenosis. Mild heterogeneous mural  based echogenic plaque seen about the right carotid bulb. No significant elevation in peak systolic velocity to suggest hemodynamically significant greater than 50% stenosis. Visualized right ICA patent distally without stenosis. RIGHT VERTEBRAL ARTERY:  Patent with antegrade flow.  LEFT CAROTID ARTERY: Diffuse smooth intimal thickening seen within the visualized left CCA without significant stenosis. Mild scattered heterogeneous echogenic plaque about the left carotid bulb/proximal left ICA. No significant elevation in peak systolic velocity to suggest hemodynamically significant greater than 50% stenosis. Visualized left ICA patent distally without visible stenosis. LEFT VERTEBRAL ARTERY:  Patent with antegrade flow. IMPRESSION: 1. Mild atherosclerotic plaque within the carotid bulbs and proximal ICAs bilaterally, but no hemodynamically significant greater than 50% stenosis. 2. Patent antegrade flow within both vertebral arteries. Electronically Signed   By: Jeannine Boga M.D.   On: 08/03/2021 03:26   MR Cervical Spine Wo Contrast  Result Date: 08/03/2021 CLINICAL DATA:  Initial evaluation for ataxia, right-sided numbness. EXAM: MRI CERVICAL SPINE WITHOUT CONTRAST TECHNIQUE: Multiplanar, multisequence MR imaging of the cervical spine was performed. No intravenous contrast was administered. COMPARISON:  None available. FINDINGS: Alignment: Examination degraded by motion artifact. Straightening with mild reversal of the normal cervical lordosis. No listhesis. Vertebrae: Vertebral body height maintained without acute or chronic fracture. Bone marrow signal intensity within normal limits. No discrete or worrisome osseous lesions or abnormal marrow edema. Cord: Normal signal and morphology. No convincing cord signal changes on this motion degraded exam. Posterior Fossa, vertebral arteries, paraspinal tissues: Unremarkable. Disc levels: C2-C3: Unremarkable. C3-C4: Mild disc bulge with endplate and  uncovertebral spurring. Flattening and partial effacement of the ventral thecal sac with resultant mild spinal stenosis. Moderate bilateral C4 foraminal narrowing. C4-C5: Mild disc bulge with uncovertebral spurring. Mild flattening of the ventral thecal sac without significant spinal stenosis. Moderate right with mild left C5 foraminal stenosis. C5-C6: Minimal disc bulge with uncovertebral spurring. No spinal stenosis. Mild right C6 foraminal narrowing. Left neural foramen remains patent. C6-C7: Minimal annular disc bulge. No spinal stenosis. Foramina remain patent. C7-T1:  Unremarkable. Visualized upper thoracic spine demonstrates no significant finding. IMPRESSION: 1. Motion degraded exam. 2. No acute abnormality within the cervical spine. 3. Mild multilevel cervical spondylosis with resultant mild spinal stenosis at C3-4. Associated moderate bilateral C4 foraminal stenosis, with mild right C5 and C6 foraminal narrowing. Electronically Signed   By: Jeannine Boga M.D.   On: 08/03/2021 01:04   MR BRAIN WO CONTRAST  Result Date: 08/03/2021 CLINICAL DATA:  Initial evaluation for neuro deficit, stroke suspected. EXAM: MRI HEAD WITHOUT CONTRAST TECHNIQUE: Multiplanar, multiecho pulse sequences of the brain and surrounding structures were obtained without intravenous contrast. COMPARISON:  Prior CT from earlier the same day as well as previous MRI from 09/17/2015. FINDINGS: Brain: Generalized age-related cerebral atrophy. Patchy T2/FLAIR hyperintensity involving the periventricular and deep white matter both cerebral hemispheres as well as the pons, most consistent with chronic small vessel ischemic disease, moderately advanced in nature. Area of encephalomalacia of involving parasagittal anterior/inferior right frontal lobe noted at site of prior hemorrhage. Additional smaller foci of encephalomalacia involving the inferior temporal poles bilaterally likely posttraumatic in nature. Few scattered small remote  left cerebellar infarcts noted. Subtle area of diffusion signal abnormality involving the ventral medulla measuring up to approximately 8 mm is seen (a series 5, image 12). While this finding could potentially be artifactual, possibly due to prominent vascular calcifications about the adjacent vertebral arteries, a possible small acute ischemic infarct could be considered. No associated hemorrhage or mass effect. No other evidence for acute or subacute ischemia. No acute intracranial hemorrhage. Single chronic microhemorrhage noted within the right cerebellum, of doubtful significance in isolation. No mass lesion, midline shift or mass effect. No hydrocephalus or extra-axial fluid collection. Pituitary gland and  suprasellar region within normal limits. Vascular: Major intracranial vascular flow voids are maintained. Skull and upper cervical spine: Craniocervical junction within normal limits. Bone marrow signal intensity normal. No scalp soft tissue abnormality. Sinuses/Orbits: Globes and orbital soft tissues within normal limits. Paranasal sinuses are largely clear. No mastoid effusion. Other: None. IMPRESSION: 1. 8 mm focus of diffusion signal abnormality involving the ventral medulla, indeterminate. While this finding could potentially be artifactual in nature, a possible small acute ischemic infarct is difficult to exclude, and could be considered in the correct clinical setting. No associated hemorrhage or mass effect. 2. No other acute intracranial abnormality. 3. Underlying atrophy with moderate chronic small vessel ischemic disease with a few small remote left cerebellar infarcts. Area of encephalomalacia of involving the anterior/inferior right frontal lobe at site of previous hemorrhage. Electronically Signed   By: Jeannine Boga M.D.   On: 08/03/2021 00:59   CT Head Wo Contrast  Result Date: 08/02/2021 CLINICAL DATA:  New right upper extremity tremor. Tingling to the arm. EXAM: CT HEAD WITHOUT  CONTRAST TECHNIQUE: Contiguous axial images were obtained from the base of the skull through the vertex without intravenous contrast. RADIATION DOSE REDUCTION: This exam was performed according to the departmental dose-optimization program which includes automated exposure control, adjustment of the mA and/or kV according to patient size and/or use of iterative reconstruction technique. COMPARISON:  MRI brain 09/17/2015 FINDINGS: Brain: Mild cerebral atrophy. Mild ventricular dilatation consistent with central atrophy. Patchy low-attenuation changes in the deep white matter consistent small vessel ischemic change. Old area of encephalomalacia in the right anterior frontal lobe corresponding to previous intraparenchymal hemorrhage on prior MRI. No acute mass effect or midline shift. No abnormal extra-axial fluid collections. Gray-white matter junctions are distinct. Basal cisterns are not effaced. No acute intracranial hemorrhage. Vascular: No hyperdense vessel or unexpected calcification. Skull: Normal. Negative for fracture or focal lesion. Sinuses/Orbits: Paranasal sinuses and mastoid air cells are clear. Other: None. IMPRESSION: No acute intracranial abnormalities. Chronic atrophy and small vessel ischemic changes. 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 Electronically Signed   By: Lucienne Capers M.D.   On: 08/02/2021 21:20   DG Foot Complete Right  Result Date: 08/02/2021 CLINICAL DATA:  Chronic RIGHT arm and leg pain for since Sunday, RIGHT leg cold to touch, swelling RIGHT great toe EXAM: RIGHT FOOT COMPLETE - 3+ VIEW COMPARISON:  None Available. FINDINGS: Osseous mineralization normal. 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 Remaining joint spaces preserved. No acute fracture, dislocation, or bone destruction. Small plantar calcaneal spur. IMPRESSION: Degenerative changes first MTP joint and small plantar calcaneal spur. No acute osseous abnormalities.  Electronically Signed   By: Lavonia Dana M.D.   On: 08/02/2021 20:51     Medical Consultants:   None.   Subjective:    Rayshaun Needle no complaints ready to get this over with.  Objective:    Vitals:   08/03/21 2204 08/04/21 0005 08/04/21 0405  BP: (!) 176/99 (!) 165/87 (!) 142/82  Pulse: 78 78 84  Resp:  13 16  Temp: 98.8 F (37.1 C) 98.2 F (36.8 C) 98.7 F (37.1 C)  TempSrc: Oral Oral Oral  SpO2: 100% 95% 100%   SpO2: 100 % O2 Flow Rate (L/min): 2 L/min   Intake/Output Summary (Last 24 hours) at 08/04/2021 0736 Last data filed at 08/04/2021 0448 Gross per 24 hour  Intake --  Output 225 ml  Net -225 ml   There were no vitals filed for this visit.  Exam: General exam: In no acute distress. Respiratory system: Good air movement and clear to auscultation. Cardiovascular system: S1 & S2 heard, RRR. No JVD. Gastrointestinal system: Abdomen is nondistended, soft and nontender.  Central nervous system: Left-sided weakness on upper and lower extremities 1 out of 5 Extremities: No pedal edema. Skin: No rashes, lesions or ulcers Psychiatry: Judgement and insight appear normal. Mood & affect appropriate.    Data Reviewed:    Labs: Basic Metabolic Panel: Recent Labs  Lab 08/02/21 2027 08/03/21 0347  NA 136 136  K 3.1* 3.4*  CL 101 101  CO2 25 24  GLUCOSE 182* 212*  BUN 14 11  CREATININE 0.70 0.76  CALCIUM 9.9 9.0  MG  --  1.6*   GFR Estimated Creatinine Clearance: 94 mL/min (by C-G formula based on SCr of 0.76 mg/dL). Liver Function Tests: Recent Labs  Lab 08/02/21 2027  AST 26  ALT 28  ALKPHOS 29*  BILITOT 0.5  PROT 7.3  ALBUMIN 4.2   No results for input(s): "LIPASE", "AMYLASE" in the last 168 hours. No results for input(s): "AMMONIA" in the last 168 hours. Coagulation profile No results for input(s): "INR", "PROTIME" in the last 168 hours. COVID-19 Labs  No results for input(s): "DDIMER", "FERRITIN", "LDH", "CRP" in the last 72 hours.  No  results found for: "SARSCOV2NAA"  CBC: Recent Labs  Lab 08/02/21 2027  WBC 6.4  NEUTROABS 4.3  HGB 14.2  HCT 43.0  MCV 85.5  PLT 296   Cardiac Enzymes: No results for input(s): "CKTOTAL", "CKMB", "CKMBINDEX", "TROPONINI" in the last 168 hours. BNP (last 3 results) No results for input(s): "PROBNP" in the last 8760 hours. CBG: Recent Labs  Lab 08/03/21 1112 08/03/21 1633 08/03/21 2104 08/03/21 2232 08/04/21 0635  GLUCAP 186* 144* 165* 162* 176*   D-Dimer: No results for input(s): "DDIMER" in the last 72 hours. Hgb A1c: Recent Labs    08/03/21 0347  HGBA1C 8.3*   Lipid Profile: Recent Labs    08/03/21 0347  CHOL 203*  HDL 52  LDLCALC 116*  TRIG 175*  CHOLHDL 3.9   Thyroid function studies: No results for input(s): "TSH", "T4TOTAL", "T3FREE", "THYROIDAB" in the last 72 hours.  Invalid input(s): "FREET3" Anemia work up: No results for input(s): "VITAMINB12", "FOLATE", "FERRITIN", "TIBC", "IRON", "RETICCTPCT" in the last 72 hours. Sepsis Labs: Recent Labs  Lab 08/02/21 2027  WBC 6.4   Microbiology Recent Results (from the past 240 hour(s))  Surgical PCR screen     Status: Abnormal   Collection Time: 08/04/21  4:55 AM   Specimen: Nasal Mucosa; Nasal Swab  Result Value Ref Range Status   MRSA, PCR NEGATIVE NEGATIVE Final   Staphylococcus aureus POSITIVE (A) NEGATIVE Final    Comment: (NOTE) The Xpert SA Assay (FDA approved for NASAL specimens in patients 52 years of age and older), is one component of a comprehensive surveillance program. It is not intended to diagnose infection nor to guide or monitor treatment. Performed at [facility], [place] [address], [place], [place] [address]      Medications:     stroke: early stages of recovery book   Does not apply Once   atorvastatin  80 mg Oral Daily   insulin aspart  0-15 Units Subcutaneous TID WC   insulin aspart  0-5 Units Subcutaneous QHS   insulin glargine-yfgn  5 Units Subcutaneous  Daily   mupirocin ointment  1 Application Nasal BID   Continuous Infusions:    LOS: 1 day  Charlynne Cousins  [facility]  08/04/2021, 7:36 AM

## 2021-08-04 NOTE — Progress Notes (Signed)
OT Cancellation Note  Patient Details Name: Kevyn Boquet MRN: [id number] DOB: 08-17-1955   Cancelled Treatment:    Reason Eval/Treat Not Completed: Patient at procedure or test/ unavailable- Pt in IR for stent placement. OT to return when appropriate, as able to complete OT eval.  Jolaine Artist, [place] 08/04/2021, 9:53 AM

## 2021-08-04 NOTE — Anesthesia Procedure Notes (Signed)
Procedure Name: Intubation Date/Time: 08/04/2021 9:53 AM  Performed by: Mariea Clonts, CRNAPre-anesthesia Checklist: Patient identified, Emergency Drugs available, Suction available and Patient being monitored Patient Re-evaluated:Patient Re-evaluated prior to induction Oxygen Delivery Method: Circle System Utilized Preoxygenation: Pre-oxygenation with 100% oxygen Induction Type: IV induction Ventilation: Mask ventilation without difficulty and Oral airway inserted - appropriate to patient size Laryngoscope Size: Sabra Heck and 2 Grade View: Grade II Tube type: Oral Tube size: 7.5 mm Number of attempts: 1 Airway Equipment and Method: Stylet and Oral airway Placement Confirmation: ETT inserted through vocal cords under direct vision, positive ETCO2 and breath sounds checked- equal and bilateral Tube secured with: Tape Dental Injury: Teeth and Oropharynx as per pre-operative assessment

## 2021-08-04 NOTE — Consult Note (Signed)
NEURO HOSPITALIST CONSULT NOTE   Requestig physician: Dr. Bridgett Larsson  Reason for Consult: Stroke with right-sided weakness  History obtained from:   Patient and Chart     HPI:                                                                                                                                          Joshua Donovan is an 66 y.o. male who has been transferred to [facility] from [facility] for post-stroke management including cerebral angiogram to further evaluate a left ICA thrombus versus soft plaque.   The patient has a  PMHx of traumatic brain injury with ICH, diabetes, hypertension and hyperlipidemia. He  presented to the emergency room at [facility] on Monday night with complaints right foot and leg pain with weakness as well as right hand numbness.  He started having some trouble with his right leg sometime on Saturday, 07/31/2021 and on Sunday, one of the neighbors saw him being slow and somewhat off balance while walking.  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 He was admitted for further stroke work-up. His weakness continued to worsen to also include the left leg and he also had worsening weakness on the right side with right arm also being affected. CT angio head and neck was obtained (see details below), revealing multiple intracranial severe stenoses, including near-occlusive stenosis of the vertebrobasilar junction, occlusion of the distal left V4, moderate mid basilar stenosis, nearly occlusive bilateral PCA P2 segments, nearly occlusive distal right ACA A2 segments and bulky soft plaque or thrombus in the supraclinoid left ICA with up to moderate stenosis along with mild to moderate right M1 stenosis.   He was not a candidate for any acute thrombolysis or acute endovascular thrombectomy due to being outside the window.  Dr. Estanislado Pandy [facility] earlier this evening regarding  this patient's imaging and did not feel there is a thrombus at left ICA siphon, the appearance being more consistent with soft plaque. Recommendations are for patient to be NPO with Dr. Estanislado Pandy anticipating angiogram in AM with plan to put in basilar artery stent.    The patient initially was on heparin at [facility] which was stopped given low suspicion for thrombus. He was loaded with ASA and Brilinta by the Neurology service at [facility] prior to transfer to [facility]. The patient will be followed in the AM by the [facility] Stroke Team.   Past Medical History:  Diagnosis Date   Diabetes mellitus without complication ([place])    GERD (gastroesophageal reflux disease)    Hemorrhage in the brain ([place]) 08/2015   High cholesterol    Hyperlipidemia    Hypertension    Hypertensive emergency  08/03/2021    Past Surgical History:  Procedure Laterality Date   COLONOSCOPY WITH PROPOFOL N/A 09/08/2017   Procedure: COLONOSCOPY WITH PROPOFOL;  Surgeon: Lucilla Lame, MD;  Location: [place];  Service: Endoscopy;  Laterality: N/A;  Diabetic - oral meds   partial intestine removed     approx date: 1980   POLYPECTOMY  09/08/2017   Procedure: POLYPECTOMY;  Surgeon: Lucilla Lame, MD;  Location: [place];  Service: Endoscopy;;    Family History  Problem Relation Age of Onset   Hypertension Father    Diabetes Sister             Social History:  reports that he quit smoking about 33 years ago. He has never used smokeless tobacco. He reports current alcohol use of about 7.0 standard drinks of alcohol per week. He reports that he does not use drugs.  Allergies  Allergen Reactions   Lisinopril Swelling   Ace Inhibitors Swelling   Anchovies [Fish Allergy] Swelling   Sulfa Antibiotics Swelling    MEDICATIONS:                                                                                                                     Prior to Admission:  Medications Prior to Admission  Medication Sig Dispense  Refill Last Dose   ACCU-CHEK FASTCLIX LANCETS MISC Use as directed twice daily dia E11.65 100 each 1    atorvastatin (LIPITOR) 20 MG tablet Take 20 mg by mouth daily.      atorvastatin (LIPITOR) 80 MG tablet Take 1 tablet (80 mg total) by mouth daily.      Blood Glucose Monitoring Suppl (ACCU-CHEK AVIVA PLUS) w/Device KIT Use as directed 1 kit 0    cetirizine (ZYRTEC) 10 MG tablet Take 1 tablet (10 mg total) by mouth daily. 90 tablet 1    glucose blood (ACCU-CHEK AVIVA PLUS) test strip Use two times daily to check blood sugar 100 each 3    lisinopril (ZESTRIL) 40 MG tablet Take 40 mg by mouth daily.      metFORMIN (GLUCOPHAGE) 1000 MG tablet Take 1,000 mg by mouth 2 (two) times daily with a meal.      metoprolol tartrate (LOPRESSOR) 25 MG tablet Take 1 tablet (25 mg total) by mouth daily. (Patient not taking: Reported on 08/03/2021) 90 tablet 0    Scheduled:   stroke: early stages of recovery book   Does not apply Once   atorvastatin  80 mg Oral Daily   insulin aspart  0-15 Units Subcutaneous TID WC   insulin aspart  0-5 Units Subcutaneous QHS   insulin glargine-yfgn  5 Units Subcutaneous Daily    ROS:  States that he is weak all over, worse on the right. Other ROS as per HPI.,    Blood pressure (!) 165/87, pulse 78, temperature 98.2 F (36.8 C), temperature source Oral, resp. rate 13, SpO2 95 %.   General Examination:                                                                                                       Physical Exam  HEENT-  [place]/AT    Lungs- Respirations unlabored Extremities- No edema   Neurological Examination Mental Status: Awkae, alert and fully oriented. Speech mildly dysarthric but fluent without evidence of aphasia.  Able to follow all commands without difficulty. Cranial Nerves: II: Tracks and fixates normally  III,IV, VI:  No ptosis. EOMI.  V: Temp sensation equal bilaterally VII: No facial droop; mild asymmetric lip swelling noted VIII: Hearing intact to voice IX,X: Mild hypophonia XI: Head is midline XII: Midline tongue extension Motor: RUE: 1/5 deltoid, 2/5 biceps, 0/5 hand grip RLE 0/5 proximally and distally LUE 3-4/5 proximally and distally LLE: 2/5 hip flexion, knee flexion, knee extension Sensory: Temp and light touch intact throughout, bilaterally without asymmetry Deep Tendon Reflexes: 1-2+ bilateral upper extremities, 2-3+ bilateral patellae  Cerebellar: No LUE ataxia. Unable to assess LLE, RUE or RLE  Gait: Unable to assess   Lab Results: Basic Metabolic Panel: Recent Labs  Lab 08/02/21 2027 08/03/21 0347  NA 136 136  K 3.1* 3.4*  CL 101 101  CO2 25 24  GLUCOSE 182* 212*  BUN 14 11  CREATININE 0.70 0.76  CALCIUM 9.9 9.0  MG  --  1.6*    CBC: Recent Labs  Lab 08/02/21 2027  WBC 6.4  NEUTROABS 4.3  HGB 14.2  HCT 43.0  MCV 85.5  PLT 296    Cardiac Enzymes: No results for input(s): "CKTOTAL", "CKMB", "CKMBINDEX", "TROPONINI" in the last 168 hours.  Lipid Panel: Recent Labs  Lab 08/03/21 0347  CHOL 203*  TRIG 175*  HDL 52  CHOLHDL 3.9  VLDL 35  LDLCALC 116*    Imaging: MR BRAIN WO CONTRAST  Result Date: 08/03/2021 CLINICAL DATA:  66 year old male with severe intracranial atherosclerosis and questionable medullary ischemia on brain MRI yesterday. EXAM: MRI HEAD WITHOUT CONTRAST TECHNIQUE: Multiplanar, multiecho pulse sequences of the brain and surrounding structures were obtained without intravenous contrast. COMPARISON:  CTA head and neck this morning.  Brain MRI yesterday. FINDINGS: Brain: More convincing appearance of ventral medullary restricted diffusion, asymmetrically greater to the left of midline. See series 5, image 12, series 7, images 18 and 19. Mild if any associated T2 and FLAIR hyperintensity there. And no other convincing diffusion restriction  despite the CTA findings earlier today. 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 Chronic encephalomalacia right inferior frontal gyrus with hemosiderin. Occasional chronic microhemorrhages elsewhere, right cerebellum (series 13, image 19). No new signal abnormality compared to yesterday. No midline shift, mass effect, evidence of mass lesion, ventriculomegaly, extra-axial collection or acute intracranial hemorrhage. Cervicomedullary junction and pituitary are within normal limits. Vascular: Major  intracranial vascular flow voids are stable from yesterday. See also CTA today reported separately. Skull and upper cervical spine: Negative. Visualized bone marrow signal is within normal limits. Sinuses/Orbits: Stable, negative. Other: Visible internal auditory structures appear normal. Mastoids are clear. Negative visible scalp and face. IMPRESSION: 1. Positive for ischemia/infarction of the ventral Medulla as suspected yesterday, slightly greater on the left. No associated hemorrhage or mass effect. 2. 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 These results were communicated to Dr. Rory Percy at 11:06 am on 08/03/2021 by text page via the [facility] messaging system. Electronically Signed   By: Genevie Ann M.D.   On: 08/03/2021 11:06   CT ANGIO HEAD NECK W WO CM  Addendum Date: 08/03/2021   ADDENDUM REPORT: 08/03/2021 09:17 ADDENDUM: Study discussed by telephone with Dr. Amie Portland on 08/03/2021 at 0911 hours. Electronically Signed   By: Genevie Ann M.D.   On: 08/03/2021 09:17   Result Date: 08/03/2021 CLINICAL DATA:  66 year old male with neurologic deficit. Questionable small vessel ischemia of the medulla on brain MRI yesterday. EXAM: CT ANGIOGRAPHY HEAD AND NECK TECHNIQUE: Multidetector CT imaging of the head and neck was performed  using the standard protocol during bolus administration of intravenous contrast. Multiplanar CT image reconstructions and MIPs were obtained to evaluate the vascular anatomy. Carotid stenosis measurements (when applicable) are obtained utilizing NASCET criteria, using the distal internal carotid diameter as the denominator. RADIATION DOSE REDUCTION: This exam was performed according to the departmental dose-optimization program which includes automated exposure control, adjustment of the mA and/or kV according to patient size and/or use of iterative reconstruction technique. CONTRAST:  57m OMNIPAQUE IOHEXOL 350 MG/ML SOLN COMPARISON:  Brain MRI yesterday.  Head CT yesterday. FINDINGS: CT HEAD Brain: Bulky Calcified atherosclerosis at the skull base. Stable CT appearance of the brainstem since yesterday. No acute intracranial hemorrhage identified. No midline shift, mass effect, or evidence of intracranial mass lesion. Chronic right inferior frontal gyrus encephalomalacia, patchy bilateral cerebral white matter hypodensity more pronounced on the left. 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 No ventriculomegaly or acute cortically based infarct. Calvarium and skull base: No acute osseous abnormality identified. Paranasal sinuses: Visualized paranasal sinuses and mastoids are stable and well aerated. Orbits: Visualized orbits and scalp soft tissues are within normal limits. CTA NECK Skeleton: Periapical dental lucency bilaterally. Ordinary cervical spine degeneration. No acute osseous abnormality identified. Upper chest: Centrilobular and paraseptal emphysema. No superior mediastinal lymphadenopathy. Other neck: 15 mm hypodense right thyroid nodule. Recommend thyroid [place](ref: J Am Coll Radiol. 2015 Feb;12(2): 143-50).Dermal and subcutaneous probable 2.6 cm sebaceous cyst of the left submandibular face (series 13, image 79). Otherwise negative CT appearance of the neck soft tissues. Aortic arch: 3  vessel arch configuration. Mild arch atherosclerosis. Right carotid system: Negative brachiocephalic artery and proximal right CCA. Retropharyngeal course of the right carotid bifurcation with mild calcified plaque. Tortuous right ICA distal to the bulb, no stenosis. Left carotid system: Similar mild tortuosity and atherosclerosis with no hemodynamically significant stenosis. Vertebral arteries: Tortuous proximal right subclavian artery with a mildly kinked appearance in the superior mediastinum. Normal right vertebral artery origin. Patent right vertebral artery to the skull base with no significant plaque or stenosis. Mild to moderate soft and calcified plaque in the proximal left subclavian artery with less than 50 % stenosis with respect to the distal vessel. Mild atherosclerosis and stenosis at the left vertebral  artery origin on series 13, image 159. Non dominant left vertebral artery than is patent to the skull base with no additional plaque or stenosis. CTA HEAD Posterior circulation: Bilateral V4 segment calcified plaque. Severe stenosis on the left (series 12, image 139) upstream of the left PICA origin which remains patent. But the left V4 segment is occluded distal to the left PICA. Contralateral right V4 moderate tandem stenoses upstream of the patent right PICA origin on series 12, image 130. And severe stenosis, near occlusion of the right vertebrobasilar junction (series 16, image 26). The basilar artery remains patent but is moderately stenotic in the mid basilar segment on series 16, image 24. Distal basilar remains patent along with SCA and PCA origins. Left posterior communicating artery is present, the right is diminutive or absent. a mild to moderate stenosis left PCA origin. And bilateral P2 segment severe stenosis, near occlusion on series 15, image 53. But there is preserved distal PCA branch enhancement bilaterally. Anterior circulation: Both ICA siphons are patent. However, on the left  there is bulky soft plaque or thrombus in the supraclinoid ICA (series 14, image 162) this is between normal appearing left ophthalmic and posterior communicating artery origins. Left carotid terminus remains patent. Contralateral right ICA siphon with mild to moderate calcified plaque and mild supraclinoid stenosis. Patent carotid termini, MCA and ACA origins. Tortuous A1 segments. Normal anterior communicating artery. Proximal A2 segments are normal. There is severe right ACA distal A2 segment stenosis, near occlusion on series 17, image 32. Left MCA M1 segment is mildly irregular. Left MCA bifurcation is patent without stenosis. Left MCA branches are mildly irregular. Mild to moderate somewhat long segment right MCA M1 stenosis along 6 mm series 16, image 20. Distal M1 and right MCA trifurcation remain patent. Right MCA branches are mildly irregular. Venous sinuses: Early contrast timing, grossly patent. Anatomic variants: Mildly dominant right vertebral artery. Review of the MIP images confirms the above findings IMPRESSION: 1. 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 - Moderate stenosis Mid Basilar Artery. - Near Occlusion bilateral PCA P2 segments, distal right ACA A2. - bulky soft plaque or thrombus in the Supraclinoid Left ICA, with up to Moderate stenosis. - mild-to-moderate Right MCA M1 stenosis. 2. Stable CT appearance of the brain since yesterday. No new intracranial abnormality. 3. Aortic Atherosclerosis (ICD10-I70.0) and Emphysema (ICD10-J43.9). Electronically Signed: By: Genevie Ann M.D. On: 08/03/2021 09:01   US Carotid Bilateral (at [facility] and [facility] only)  Result Date: 08/03/2021 CLINICAL DATA:  Follow-up examination for stroke. EXAM: BILATERAL CAROTID DUPLEX ULTRASOUND TECHNIQUE: Pearline Cables scale imaging, color Doppler and duplex ultrasound were performed of bilateral  carotid and vertebral arteries in the neck. COMPARISON:  Prior Study from 04/03/2014. FINDINGS: Criteria: Quantification of carotid stenosis is based on velocity parameters that correlate the residual internal carotid diameter with NASCET-based stenosis levels, using the diameter of the distal internal carotid lumen as the denominator for stenosis measurement. The following velocity measurements were obtained: RIGHT ICA: 84/10 cm/sec CCA: 50/53 cm/sec SYSTOLIC ICA/CCA RATIO:  1.1 ECA: 64 cm/sec LEFT ICA: 64/16 cm/sec CCA: 976/73 cm/sec SYSTOLIC ICA/CCA RATIO:  0.9 ECA: 117 cm/sec RIGHT CAROTID ARTERY: Mild smooth intimal thickening seen within the visualized right CCA without significant stenosis. Mild heterogeneous mural based echogenic plaque seen about the right carotid bulb. No significant elevation in peak systolic velocity to suggest hemodynamically significant greater than 50% stenosis. Visualized right  ICA patent distally without stenosis. RIGHT VERTEBRAL ARTERY:  Patent with antegrade flow. LEFT CAROTID ARTERY: Diffuse smooth intimal thickening seen within the visualized left CCA without significant stenosis. Mild scattered heterogeneous echogenic plaque about the left carotid bulb/proximal left ICA. No significant elevation in peak systolic velocity to suggest hemodynamically significant greater than 50% stenosis. Visualized left ICA patent distally without visible stenosis. LEFT VERTEBRAL ARTERY:  Patent with antegrade flow. IMPRESSION: 1. Mild atherosclerotic plaque within the carotid bulbs and proximal ICAs bilaterally, but no hemodynamically significant greater than 50% stenosis. 2. Patent antegrade flow within both vertebral arteries. Electronically Signed   By: Jeannine Boga M.D.   On: 08/03/2021 03:26   MR Cervical Spine Wo Contrast  Result Date: 08/03/2021 CLINICAL DATA:  Initial evaluation for ataxia, right-sided numbness. EXAM: MRI CERVICAL SPINE WITHOUT CONTRAST TECHNIQUE:  Multiplanar, multisequence MR imaging of the cervical spine was performed. No intravenous contrast was administered. COMPARISON:  None available. FINDINGS: Alignment: Examination degraded by motion artifact. Straightening with mild reversal of the normal cervical lordosis. No listhesis. Vertebrae: Vertebral body height maintained without acute or chronic fracture. Bone marrow signal intensity within normal limits. No discrete or worrisome osseous lesions or abnormal marrow edema. Cord: Normal signal and morphology. No convincing cord signal changes on this motion degraded exam. Posterior Fossa, vertebral arteries, paraspinal tissues: Unremarkable. Disc levels: C2-C3: Unremarkable. C3-C4: Mild disc bulge with endplate and uncovertebral spurring. Flattening and partial effacement of the ventral thecal sac with resultant mild spinal stenosis. Moderate bilateral C4 foraminal narrowing. C4-C5: Mild disc bulge with uncovertebral spurring. Mild flattening of the ventral thecal sac without significant spinal stenosis. Moderate right with mild left C5 foraminal stenosis. C5-C6: Minimal disc bulge with uncovertebral spurring. No spinal stenosis. Mild right C6 foraminal narrowing. Left neural foramen remains patent. C6-C7: Minimal annular disc bulge. No spinal stenosis. Foramina remain patent. C7-T1:  Unremarkable. Visualized upper thoracic spine demonstrates no significant finding. IMPRESSION: 1. Motion degraded exam. 2. No acute abnormality within the cervical spine. 3. Mild multilevel cervical spondylosis with resultant mild spinal stenosis at C3-4. Associated moderate bilateral C4 foraminal stenosis, with mild right C5 and C6 foraminal narrowing. Electronically Signed   By: Jeannine Boga M.D.   On: 08/03/2021 01:04   MR BRAIN WO CONTRAST  Result Date: 08/03/2021 CLINICAL DATA:  Initial evaluation for neuro deficit, stroke suspected. EXAM: MRI HEAD WITHOUT CONTRAST TECHNIQUE: Multiplanar, multiecho pulse  sequences of the brain and surrounding structures were obtained without intravenous contrast. COMPARISON:  Prior CT from earlier the same day as well as previous MRI from 09/17/2015. FINDINGS: Brain: Generalized age-related cerebral atrophy. Patchy T2/FLAIR hyperintensity involving the periventricular and deep white matter both cerebral hemispheres as well as the pons, most consistent with chronic small vessel ischemic disease, moderately advanced in nature. Area of encephalomalacia of involving parasagittal anterior/inferior right frontal lobe noted at site of prior hemorrhage. Additional smaller foci of encephalomalacia involving the inferior temporal poles bilaterally likely posttraumatic in nature. Few scattered small remote left cerebellar infarcts noted. Subtle area of diffusion signal abnormality involving the ventral medulla measuring up to approximately 8 mm is seen (a series 5, image 12). While this finding could potentially be artifactual, possibly due to prominent vascular calcifications about the adjacent vertebral arteries, a possible small acute ischemic infarct could be considered. No associated hemorrhage or mass effect. No other evidence for acute or subacute ischemia. No acute intracranial hemorrhage. Single chronic microhemorrhage noted within the right cerebellum, of doubtful significance in isolation. No mass lesion, midline  shift or mass effect. No hydrocephalus or extra-axial fluid collection. Pituitary gland and suprasellar region within normal limits. Vascular: Major intracranial vascular flow voids are maintained. Skull and upper cervical spine: Craniocervical junction within normal limits. Bone marrow signal intensity normal. No scalp soft tissue abnormality. Sinuses/Orbits: Globes and orbital soft tissues within normal limits. Paranasal sinuses are largely clear. No mastoid effusion. Other: None. IMPRESSION: 1. 8 mm focus of diffusion signal abnormality involving the ventral medulla,  indeterminate. While this finding could potentially be artifactual in nature, a possible small acute ischemic infarct is difficult to exclude, and could be considered in the correct clinical setting. No associated hemorrhage or mass effect. 2. No other acute intracranial abnormality. 3. Underlying atrophy with moderate chronic small vessel ischemic disease with a few small remote left cerebellar infarcts. Area of encephalomalacia of involving the anterior/inferior right frontal lobe at site of previous hemorrhage. Electronically Signed   By: Jeannine Boga M.D.   On: 08/03/2021 00:59   CT Head Wo Contrast  Result Date: 08/02/2021 CLINICAL DATA:  New right upper extremity tremor. Tingling to the arm. EXAM: CT HEAD WITHOUT CONTRAST TECHNIQUE: Contiguous axial images were obtained from the base of the skull through the vertex without intravenous contrast. RADIATION DOSE REDUCTION: This exam was performed according to the departmental dose-optimization program which includes automated exposure control, adjustment of the mA and/or kV according to patient size and/or use of iterative reconstruction technique. COMPARISON:  MRI brain 09/17/2015 FINDINGS: Brain: Mild cerebral atrophy. Mild ventricular dilatation consistent with central atrophy. Patchy low-attenuation changes in the deep white matter consistent small vessel ischemic change. Old area of encephalomalacia in the right anterior frontal lobe corresponding to previous intraparenchymal hemorrhage on prior MRI. No acute mass effect or midline shift. No abnormal extra-axial fluid collections. Gray-white matter junctions are distinct. Basal cisterns are not effaced. No acute intracranial hemorrhage. Vascular: No hyperdense vessel or unexpected calcification. Skull: Normal. Negative for fracture or focal lesion. Sinuses/Orbits: Paranasal sinuses and mastoid air cells are clear. Other: None. IMPRESSION: No acute intracranial abnormalities. Chronic atrophy and  small vessel ischemic changes. 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 Electronically Signed   By: Lucienne Capers M.D.   On: 08/02/2021 21:20   DG Foot Complete Right  Result Date: 08/02/2021 CLINICAL DATA:  Chronic RIGHT arm and leg pain for since Sunday, RIGHT leg cold to touch, swelling RIGHT great toe EXAM: RIGHT FOOT COMPLETE - 3+ VIEW COMPARISON:  None Available. FINDINGS: Osseous mineralization normal. 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 Remaining joint spaces preserved. No acute fracture, dislocation, or bone destruction. Small plantar calcaneal spur. IMPRESSION: Degenerative changes first MTP joint and small plantar calcaneal spur. No acute osseous abnormalities. Electronically Signed   By: Lavonia Dana M.D.   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 Exam reveals severe weakness of all 4 extremities, right worse than left, in conjunction with mild dysarthria. No associated sensory loss. 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  2. MRI scans: - MRI brain, performed 7/25 at 0059: 8 mm focus of diffusion signal abnormality involving the ventral medulla, indeterminate. Underlying atrophy with moderate chronic small vessel ischemic disease with a few small remote left cerebellar infarcts. Area of encephalomalacia of involving the anterior/inferior right frontal lobe at site of previous hemorrhage. - Repeat MRI brain,  performed 7/25 at 1106: No change from prior 3. Carotid ultrasound: Mild atherosclerotic plaque within the carotid bulbs and proximal ICAs bilaterally, but no hemodynamically significant greater than 50% stenosis. Patent antegrade flow within both vertebral arteries.  4. 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 - Moderate stenosis Mid Basilar Artery. - Near Occlusion bilateral PCA P2 segments, distal right ACA A2. - bulky soft plaque or thrombus in the Supraclinoid Left ICA, with up to Moderate stenosis. - mild-to-moderate Right MCA M1 stenosis. Aortic Atherosclerosis is also noted.  5. TTE completed with report pending 6. Stroke is most likely secondary to intracranial atherosclerosis.  7. Stroke risk factors: HTN, HLD and DM (HgbA1c of 8.3)   Recommendations: - Continue frequent neurochecks - Continue telemetry - Has been loaded with ASA and Brilinta at [facility] today. Scheduled antiplatelet dosing regimen per Stroke Team, who will reassess during AM rounds.  - PT/OT/Speech - NPO for catheter-based cerebral angiogram which is anticipated for Wednesday morning.    Electronically signed: Dr. Kerney Elbe 08/04/2021, 12:27 AM

## 2021-08-04 NOTE — Progress Notes (Signed)
STROKE TEAM PROGRESS NOTE   SUBJECTIVE (INTERVAL HISTORY) His RN is at the bedside.  Pt was immediately reintubated in PACU after attempting extubation.  Patient currently open eyes on voice, able to follow commands with eye movement and open mouth, however quadriplegic.  MRI repeat pending.   OBJECTIVE Temp:  [98.1 F (36.7 C)-98.8 F (37.1 C)] 98.2 F (36.8 C) (07/26 1600) Pulse Rate:  [78-121] 93 (07/26 1845) Cardiac Rhythm: Sinus tachycardia (07/26 1515) Resp:  [13-37] 30 (07/26 1845) BP: (128-206)/(66-99) 128/68 (07/26 1800) SpO2:  [95 %-100 %] 98 % (07/26 1845) Arterial Line BP: (112-187)/(45-99) 121/49 (07/26 1845) FiO2 (%):  [40 %-60 %] 60 % (07/26 1542) Weight:  [83.9 kg] 83.9 kg (07/26 0914)  Recent Labs  Lab 08/04/21 0809 08/04/21 1025 08/04/21 1206 08/04/21 1319 08/04/21 1744  GLUCAP 155* 174* 170* 218* 383*   Recent Labs  Lab 08/02/21 2027 08/03/21 0347  NA 136 136  K 3.1* 3.4*  CL 101 101  CO2 25 24  GLUCOSE 182* 212*  BUN 14 11  CREATININE 0.70 0.76  CALCIUM 9.9 9.0  MG  --  1.6*   Recent Labs  Lab 08/02/21 2027  AST 26  ALT 28  ALKPHOS 29*  BILITOT 0.5  PROT 7.3  ALBUMIN 4.2   Recent Labs  Lab 08/02/21 2027  WBC 6.4  NEUTROABS 4.3  HGB 14.2  HCT 43.0  MCV 85.5  PLT 296   No results for input(s): "CKTOTAL", "CKMB", "CKMBINDEX", "TROPONINI" in the last 168 hours. No results for input(s): "LABPROT", "INR" in the last 72 hours. Recent Labs    08/02/21 2027  COLORURINE YELLOW*  LABSPEC 1.018  PHURINE 6.0  GLUCOSEU 50*  HGBUR NEGATIVE  BILIRUBINUR NEGATIVE  KETONESUR NEGATIVE  PROTEINUR NEGATIVE  NITRITE NEGATIVE  LEUKOCYTESUR NEGATIVE       Component Value Date/Time   CHOL 203 (H) 08/03/2021 0347   TRIG 175 (H) 08/03/2021 0347   HDL 52 08/03/2021 0347   CHOLHDL 3.9 08/03/2021 0347   VLDL 35 08/03/2021 0347   LDLCALC 116 (H) 08/03/2021 0347   Lab Results  Component Value Date   HGBA1C 8.3 (H) 08/03/2021   No results  found for: "LABOPIA", "COCAINSCRNUR", "LABBENZ", "AMPHETMU", "THCU", "LABBARB"  No results for input(s): "ETH" in the last 168 hours.  I have personally reviewed the radiological images below and agree with the radiology interpretations.  DG Abd 1 View  Result Date: 08/04/2021 CLINICAL DATA:  NG tube placement EXAM: ABDOMEN - 1 VIEW COMPARISON:  10/11/2012 FINDINGS: Tip of NG tube is seen in the region of the antrum of the stomach. Bowel gas pattern is nonspecific. Pelvis is not included in its entirety. IMPRESSION: Tip of NG tube is seen in the stomach. Electronically Signed   By: Elmer Picker M.D.   On: 08/04/2021 17:32   DG Chest Port 1 View  Result Date: 08/04/2021 CLINICAL DATA:  Intubated EXAM: PORTABLE CHEST 1 VIEW COMPARISON:  08/04/2021 FINDINGS: Endotracheal tube tip is about 4.7 cm superior to the carina. Subsegmental atelectasis at the left base. Cardiomegaly. No consolidation, pleural effusion or pneumothorax IMPRESSION: 1. Endotracheal tube tip about 4.7 cm superior to the carina. 2. Cardiomegaly.  Subsegmental atelectasis at the left base. Electronically Signed   By: Donavan Foil M.D.   On: 08/04/2021 15:10   DG Chest Port 1 View  Result Date: 08/04/2021 CLINICAL DATA:  Endotracheal intubation EXAM: PORTABLE CHEST 1 VIEW COMPARISON:  Earlier same day FINDINGS: Endotracheal tube tip 6 cm  above the carina. Cardiomegaly persists. Pulmonary venous hypertension, possibly with early interstitial edema. Right lateral chest not included on the image. No evidence of collapse or effusion. IMPRESSION: Endotracheal tube tip 6 cm above the carina. Electronically Signed   By: Nelson Chimes M.D.   On: 08/04/2021 13:42   ECHOCARDIOGRAM COMPLETE  Result Date: 08/04/2021    ECHOCARDIOGRAM REPORT   Patient Name:   Joshua Donovan Date of Exam: 08/03/2021 Medical Rec #:  [id number]   Height:       67.0 in Accession #:    [id number]  Weight:       185.0 lb Date of Birth:  Feb 04, 1955   BSA:           1.956 m Patient Age:    66 years    BP:           142/79 mmHg Patient Gender: M           HR:           76 bpm. Exam Location:  [facility] Procedure: 2D Echo, Cardiac Doppler and Color Doppler Indications:     I63.9 Stroke  History:         Patient has no prior history of Echocardiogram examinations.                  Risk Factors:Diabetes, Dyslipidemia, Hypertension and Former                  Smoker.  Sonographer:     Rosalia Hammers Referring Phys:  [id number] Athena Masse Diagnosing Phys: [place]  1. Left ventricular ejection fraction, by estimation, is 60 to 65%. The left ventricle has normal function. The left ventricle has no regional wall motion abnormalities. There is moderate concentric left ventricular hypertrophy. Left ventricular diastolic parameters are consistent with Grade I diastolic dysfunction (impaired relaxation).  2. Right ventricular systolic function is normal. The right ventricular size is normal.  3. The mitral valve is normal in structure. Trivial mitral valve regurgitation. No evidence of mitral stenosis.  4. The aortic valve is normal in structure. Aortic valve regurgitation is mild. No aortic stenosis is present.  5. The inferior vena cava is normal in size with greater than 50% respiratory variability, suggesting right atrial pressure of 3 mmHg. FINDINGS  Left Ventricle: Left ventricular ejection fraction, by estimation, is 60 to 65%. The left ventricle has normal function. The left ventricle has no regional wall motion abnormalities. The left ventricular internal cavity size was normal in size. There is  moderate concentric left ventricular hypertrophy. Left ventricular diastolic parameters are consistent with Grade I diastolic dysfunction (impaired relaxation). Right Ventricle: The right ventricular size is normal. No increase in right ventricular wall thickness. Right ventricular systolic function is normal. Left Atrium: Left atrial size was normal in size. Right Atrium:  Right atrial size was normal in size. Pericardium: There is no evidence of pericardial effusion. Mitral Valve: The mitral valve is normal in structure. Trivial mitral valve regurgitation. No evidence of mitral valve stenosis. Tricuspid Valve: The tricuspid valve is normal in structure. Tricuspid valve regurgitation is trivial. No evidence of tricuspid stenosis. Aortic Valve: The aortic valve is normal in structure. Aortic valve regurgitation is mild. Aortic regurgitation PHT measures 1577 msec. No aortic stenosis is present. Aortic valve mean gradient measures 5.0 mmHg. Aortic valve peak gradient measures 9.1 mmHg. Aortic valve area, by VTI measures 2.30 cm. Pulmonic Valve: The pulmonic valve was normal in structure. Pulmonic valve regurgitation  is not visualized. No evidence of pulmonic stenosis. Aorta: The aortic root is normal in size and structure. Venous: The inferior vena cava is normal in size with greater than 50% respiratory variability, suggesting right atrial pressure of 3 mmHg. IAS/Shunts: No atrial level shunt detected by color flow Doppler.  LEFT VENTRICLE PLAX 2D LVIDd:         3.84 cm   Diastology LVIDs:         2.17 cm   LV e' medial:    6.39 cm/s LV PW:         1.65 cm   LV E/e' medial:  9.6 LV IVS:        1.18 cm   LV e' lateral:   7.83 cm/s LVOT diam:     1.90 cm   LV E/e' lateral: 7.8 LV SV:         74 LV SV Index:   38 LVOT Area:     2.84 cm  RIGHT VENTRICLE RV Basal diam:  3.15 cm RV S prime:     17.40 cm/s TAPSE (M-mode): 2.4 cm LEFT ATRIUM             Index        RIGHT ATRIUM           Index LA diam:        3.20 cm 1.64 cm/m   RA Area:     18.10 cm LA Vol (A2C):   72.4 ml 37.01 ml/m  RA Volume:   47.10 ml  24.08 ml/m LA Vol (A4C):   65.8 ml 33.63 ml/m LA Biplane Vol: 69.3 ml 35.42 ml/m  AORTIC VALVE AV Area (Vmax):    2.10 cm AV Area (Vmean):   2.06 cm AV Area (VTI):     2.30 cm AV Vmax:           151.00 cm/s AV Vmean:          100.000 cm/s AV VTI:            0.323 m AV Peak  Grad:      9.1 mmHg AV Mean Grad:      5.0 mmHg LVOT Vmax:         112.00 cm/s LVOT Vmean:        72.600 cm/s LVOT VTI:          0.262 m LVOT/AV VTI ratio: 0.81 AI PHT:            1577 msec  AORTA Ao Root diam: 3.20 cm MITRAL VALVE MV Area (PHT): 3.20 cm     SHUNTS MV Decel Time: 237 msec     Systemic VTI:  0.26 m MV E velocity: 61.30 cm/s   Systemic Diam: 1.90 cm MV A velocity: 101.00 cm/s MV E/A ratio:  0.61 Shaukat Khan Electronically signed by Neoma Laming Signature Date/Time: 08/04/2021/8:55:17 AM    Final    MR BRAIN WO CONTRAST  Result Date: 08/03/2021 CLINICAL DATA:  66 year old male with severe intracranial atherosclerosis and questionable medullary ischemia on brain MRI yesterday. EXAM: MRI HEAD WITHOUT CONTRAST TECHNIQUE: Multiplanar, multiecho pulse sequences of the brain and surrounding structures were obtained without intravenous contrast. COMPARISON:  CTA head and neck this morning.  Brain MRI yesterday. FINDINGS: Brain: More convincing appearance of ventral medullary restricted diffusion, asymmetrically greater to the left of midline. See series 5, image 12, series 7, images 18 and 19. Mild if any associated T2 and FLAIR hyperintensity there. And no other convincing diffusion  restriction despite the CTA findings earlier today. 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 Chronic encephalomalacia right inferior frontal gyrus with hemosiderin. Occasional chronic microhemorrhages elsewhere, right cerebellum (series 13, image 19). No new signal abnormality compared to yesterday. No midline shift, mass effect, evidence of mass lesion, ventriculomegaly, extra-axial collection or acute intracranial hemorrhage. Cervicomedullary junction and pituitary are within normal limits. Vascular: Major intracranial vascular flow voids are stable from yesterday. See also CTA today reported separately. Skull and upper cervical spine: Negative. Visualized bone  marrow signal is within normal limits. Sinuses/Orbits: Stable, negative. Other: Visible internal auditory structures appear normal. Mastoids are clear. Negative visible scalp and face. IMPRESSION: 1. Positive for ischemia/infarction of the ventral Medulla as suspected yesterday, slightly greater on the left. No associated hemorrhage or mass effect. 2. 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 These results were communicated to Dr. Rory Percy at 11:06 am on 08/03/2021 by text page via the [facility] messaging system. Electronically Signed   By: Genevie Ann M.D.   On: 08/03/2021 11:06   CT ANGIO HEAD NECK W WO CM  Addendum Date: 08/03/2021   ADDENDUM REPORT: 08/03/2021 09:17 ADDENDUM: Study discussed by telephone with Dr. Amie Portland on 08/03/2021 at 0911 hours. Electronically Signed   By: Genevie Ann M.D.   On: 08/03/2021 09:17   Result Date: 08/03/2021 CLINICAL DATA:  66 year old male with neurologic deficit. Questionable small vessel ischemia of the medulla on brain MRI yesterday. EXAM: CT ANGIOGRAPHY HEAD AND NECK TECHNIQUE: Multidetector CT imaging of the head and neck was performed using the standard protocol during bolus administration of intravenous contrast. Multiplanar CT image reconstructions and MIPs were obtained to evaluate the vascular anatomy. Carotid stenosis measurements (when applicable) are obtained utilizing NASCET criteria, using the distal internal carotid diameter as the denominator. RADIATION DOSE REDUCTION: This exam was performed according to the departmental dose-optimization program which includes automated exposure control, adjustment of the mA and/or kV according to patient size and/or use of iterative reconstruction technique. CONTRAST:  65m OMNIPAQUE IOHEXOL 350 MG/ML SOLN COMPARISON:  Brain MRI yesterday.  Head CT yesterday. FINDINGS: CT HEAD Brain: Bulky  Calcified atherosclerosis at the skull base. Stable CT appearance of the brainstem since yesterday. No acute intracranial hemorrhage identified. No midline shift, mass effect, or evidence of intracranial mass lesion. Chronic right inferior frontal gyrus encephalomalacia, patchy bilateral cerebral white matter hypodensity more pronounced on the left. 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 No ventriculomegaly or acute cortically based infarct. Calvarium and skull base: No acute osseous abnormality identified. Paranasal sinuses: Visualized paranasal sinuses and mastoids are stable and well aerated. Orbits: Visualized orbits and scalp soft tissues are within normal limits. CTA NECK Skeleton: Periapical dental lucency bilaterally. Ordinary cervical spine degeneration. No acute osseous abnormality identified. Upper chest: Centrilobular and paraseptal emphysema. No superior mediastinal lymphadenopathy. Other neck: 15 mm hypodense right thyroid nodule. Recommend thyroid [place](ref: J Am Coll Radiol. 2015 Feb;12(2): 143-50).Dermal and subcutaneous probable 2.6 cm sebaceous cyst of the left submandibular face (series 13, image 79). Otherwise negative CT appearance of the neck soft tissues. Aortic arch: 3 vessel arch configuration. Mild arch atherosclerosis. Right carotid system: Negative brachiocephalic artery and proximal right CCA. Retropharyngeal course of the right carotid bifurcation with mild calcified plaque. Tortuous right ICA distal to the bulb, no stenosis. Left carotid system: Similar mild tortuosity and atherosclerosis with  no hemodynamically significant stenosis. Vertebral arteries: Tortuous proximal right subclavian artery with a mildly kinked appearance in the superior mediastinum. Normal right vertebral artery origin. Patent right vertebral artery to the skull base with no significant plaque or stenosis. Mild to moderate soft and calcified plaque in the proximal left subclavian artery with  less than 50 % stenosis with respect to the distal vessel. Mild atherosclerosis and stenosis at the left vertebral artery origin on series 13, image 159. Non dominant left vertebral artery than is patent to the skull base with no additional plaque or stenosis. CTA HEAD Posterior circulation: Bilateral V4 segment calcified plaque. Severe stenosis on the left (series 12, image 139) upstream of the left PICA origin which remains patent. But the left V4 segment is occluded distal to the left PICA. Contralateral right V4 moderate tandem stenoses upstream of the patent right PICA origin on series 12, image 130. And severe stenosis, near occlusion of the right vertebrobasilar junction (series 16, image 26). The basilar artery remains patent but is moderately stenotic in the mid basilar segment on series 16, image 24. Distal basilar remains patent along with SCA and PCA origins. Left posterior communicating artery is present, the right is diminutive or absent. a mild to moderate stenosis left PCA origin. And bilateral P2 segment severe stenosis, near occlusion on series 15, image 53. But there is preserved distal PCA branch enhancement bilaterally. Anterior circulation: Both ICA siphons are patent. However, on the left there is bulky soft plaque or thrombus in the supraclinoid ICA (series 14, image 162) this is between normal appearing left ophthalmic and posterior communicating artery origins. Left carotid terminus remains patent. Contralateral right ICA siphon with mild to moderate calcified plaque and mild supraclinoid stenosis. Patent carotid termini, MCA and ACA origins. Tortuous A1 segments. Normal anterior communicating artery. Proximal A2 segments are normal. There is severe right ACA distal A2 segment stenosis, near occlusion on series 17, image 32. Left MCA M1 segment is mildly irregular. Left MCA bifurcation is patent without stenosis. Left MCA branches are mildly irregular. Mild to moderate somewhat long segment  right MCA M1 stenosis along 6 mm series 16, image 20. Distal M1 and right MCA trifurcation remain patent. Right MCA branches are mildly irregular. Venous sinuses: Early contrast timing, grossly patent. Anatomic variants: Mildly dominant right vertebral artery. Review of the MIP images confirms the above findings IMPRESSION: 1. 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 - Moderate stenosis Mid Basilar Artery. - Near Occlusion bilateral PCA P2 segments, distal right ACA A2. - bulky soft plaque or thrombus in the Supraclinoid Left ICA, with up to Moderate stenosis. - mild-to-moderate Right MCA M1 stenosis. 2. Stable CT appearance of the brain since yesterday. No new intracranial abnormality. 3. Aortic Atherosclerosis (ICD10-I70.0) and Emphysema (ICD10-J43.9). Electronically Signed: By: Genevie Ann M.D. On: 08/03/2021 09:01   US Carotid Bilateral (at [facility] and [facility] only)  Result Date: 08/03/2021 CLINICAL DATA:  Follow-up examination for stroke. EXAM: BILATERAL CAROTID DUPLEX ULTRASOUND TECHNIQUE: Pearline Cables scale imaging, color Doppler and duplex ultrasound were performed of bilateral carotid and vertebral arteries in the neck. COMPARISON:  Prior Study from 04/03/2014. FINDINGS: Criteria: Quantification of carotid stenosis is based on velocity parameters that correlate the residual internal carotid diameter with NASCET-based stenosis levels, using the diameter of the distal internal carotid lumen as the denominator for stenosis measurement. The following velocity measurements were obtained: RIGHT ICA: 84/10  cm/sec CCA: 58/52 cm/sec SYSTOLIC ICA/CCA RATIO:  1.1 ECA: 64 cm/sec LEFT ICA: 64/16 cm/sec CCA: 778/24 cm/sec SYSTOLIC ICA/CCA RATIO:  0.9 ECA: 117 cm/sec RIGHT CAROTID ARTERY: Mild smooth intimal thickening seen within the visualized right CCA without significant stenosis. Mild  heterogeneous mural based echogenic plaque seen about the right carotid bulb. No significant elevation in peak systolic velocity to suggest hemodynamically significant greater than 50% stenosis. Visualized right ICA patent distally without stenosis. RIGHT VERTEBRAL ARTERY:  Patent with antegrade flow. LEFT CAROTID ARTERY: Diffuse smooth intimal thickening seen within the visualized left CCA without significant stenosis. Mild scattered heterogeneous echogenic plaque about the left carotid bulb/proximal left ICA. No significant elevation in peak systolic velocity to suggest hemodynamically significant greater than 50% stenosis. Visualized left ICA patent distally without visible stenosis. LEFT VERTEBRAL ARTERY:  Patent with antegrade flow. IMPRESSION: 1. Mild atherosclerotic plaque within the carotid bulbs and proximal ICAs bilaterally, but no hemodynamically significant greater than 50% stenosis. 2. Patent antegrade flow within both vertebral arteries. Electronically Signed   By: Jeannine Boga M.D.   On: 08/03/2021 03:26   MR Cervical Spine Wo Contrast  Result Date: 08/03/2021 CLINICAL DATA:  Initial evaluation for ataxia, right-sided numbness. EXAM: MRI CERVICAL SPINE WITHOUT CONTRAST TECHNIQUE: Multiplanar, multisequence MR imaging of the cervical spine was performed. No intravenous contrast was administered. COMPARISON:  None available. FINDINGS: Alignment: Examination degraded by motion artifact. Straightening with mild reversal of the normal cervical lordosis. No listhesis. Vertebrae: Vertebral body height maintained without acute or chronic fracture. Bone marrow signal intensity within normal limits. No discrete or worrisome osseous lesions or abnormal marrow edema. Cord: Normal signal and morphology. No convincing cord signal changes on this motion degraded exam. Posterior Fossa, vertebral arteries, paraspinal tissues: Unremarkable. Disc levels: C2-C3: Unremarkable. C3-C4: Mild disc bulge with  endplate and uncovertebral spurring. Flattening and partial effacement of the ventral thecal sac with resultant mild spinal stenosis. Moderate bilateral C4 foraminal narrowing. C4-C5: Mild disc bulge with uncovertebral spurring. Mild flattening of the ventral thecal sac without significant spinal stenosis. Moderate right with mild left C5 foraminal stenosis. C5-C6: Minimal disc bulge with uncovertebral spurring. No spinal stenosis. Mild right C6 foraminal narrowing. Left neural foramen remains patent. C6-C7: Minimal annular disc bulge. No spinal stenosis. Foramina remain patent. C7-T1:  Unremarkable. Visualized upper thoracic spine demonstrates no significant finding. IMPRESSION: 1. Motion degraded exam. 2. No acute abnormality within the cervical spine. 3. Mild multilevel cervical spondylosis with resultant mild spinal stenosis at C3-4. Associated moderate bilateral C4 foraminal stenosis, with mild right C5 and C6 foraminal narrowing. Electronically Signed   By: Jeannine Boga M.D.   On: 08/03/2021 01:04   MR BRAIN WO CONTRAST  Result Date: 08/03/2021 CLINICAL DATA:  Initial evaluation for neuro deficit, stroke suspected. EXAM: MRI HEAD WITHOUT CONTRAST TECHNIQUE: Multiplanar, multiecho pulse sequences of the brain and surrounding structures were obtained without intravenous contrast. COMPARISON:  Prior CT from earlier the same day as well as previous MRI from 09/17/2015. FINDINGS: Brain: Generalized age-related cerebral atrophy. Patchy T2/FLAIR hyperintensity involving the periventricular and deep white matter both cerebral hemispheres as well as the pons, most consistent with chronic small vessel ischemic disease, moderately advanced in nature. Area of encephalomalacia of involving parasagittal anterior/inferior right frontal lobe noted at site of prior hemorrhage. Additional smaller foci of encephalomalacia involving the inferior temporal poles bilaterally likely posttraumatic in nature. Few scattered  small remote left cerebellar infarcts noted. Subtle area of diffusion signal abnormality involving the ventral medulla measuring up  to approximately 8 mm is seen (a series 5, image 12). While this finding could potentially be artifactual, possibly due to prominent vascular calcifications about the adjacent vertebral arteries, a possible small acute ischemic infarct could be considered. No associated hemorrhage or mass effect. No other evidence for acute or subacute ischemia. No acute intracranial hemorrhage. Single chronic microhemorrhage noted within the right cerebellum, of doubtful significance in isolation. No mass lesion, midline shift or mass effect. No hydrocephalus or extra-axial fluid collection. Pituitary gland and suprasellar region within normal limits. Vascular: Major intracranial vascular flow voids are maintained. Skull and upper cervical spine: Craniocervical junction within normal limits. Bone marrow signal intensity normal. No scalp soft tissue abnormality. Sinuses/Orbits: Globes and orbital soft tissues within normal limits. Paranasal sinuses are largely clear. No mastoid effusion. Other: None. IMPRESSION: 1. 8 mm focus of diffusion signal abnormality involving the ventral medulla, indeterminate. While this finding could potentially be artifactual in nature, a possible small acute ischemic infarct is difficult to exclude, and could be considered in the correct clinical setting. No associated hemorrhage or mass effect. 2. No other acute intracranial abnormality. 3. Underlying atrophy with moderate chronic small vessel ischemic disease with a few small remote left cerebellar infarcts. Area of encephalomalacia of involving the anterior/inferior right frontal lobe at site of previous hemorrhage. Electronically Signed   By: Jeannine Boga M.D.   On: 08/03/2021 00:59   CT Head Wo Contrast  Result Date: 08/02/2021 CLINICAL DATA:  New right upper extremity tremor. Tingling to the arm. EXAM: CT  HEAD WITHOUT CONTRAST TECHNIQUE: Contiguous axial images were obtained from the base of the skull through the vertex without intravenous contrast. RADIATION DOSE REDUCTION: This exam was performed according to the departmental dose-optimization program which includes automated exposure control, adjustment of the mA and/or kV according to patient size and/or use of iterative reconstruction technique. COMPARISON:  MRI brain 09/17/2015 FINDINGS: Brain: Mild cerebral atrophy. Mild ventricular dilatation consistent with central atrophy. Patchy low-attenuation changes in the deep white matter consistent small vessel ischemic change. Old area of encephalomalacia in the right anterior frontal lobe corresponding to previous intraparenchymal hemorrhage on prior MRI. No acute mass effect or midline shift. No abnormal extra-axial fluid collections. Gray-white matter junctions are distinct. Basal cisterns are not effaced. No acute intracranial hemorrhage. Vascular: No hyperdense vessel or unexpected calcification. Skull: Normal. Negative for fracture or focal lesion. Sinuses/Orbits: Paranasal sinuses and mastoid air cells are clear. Other: None. IMPRESSION: No acute intracranial abnormalities. Chronic atrophy and small vessel ischemic changes. 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 Electronically Signed   By: Lucienne Capers M.D.   On: 08/02/2021 21:20   DG Foot Complete Right  Result Date: 08/02/2021 CLINICAL DATA:  Chronic RIGHT arm and leg pain for since Sunday, RIGHT leg cold to touch, swelling RIGHT great toe EXAM: RIGHT FOOT COMPLETE - 3+ VIEW COMPARISON:  None Available. FINDINGS: Osseous mineralization normal. 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 Remaining joint spaces preserved. No acute fracture, dislocation, or bone destruction. Small plantar calcaneal spur. IMPRESSION: Degenerative changes first MTP joint and small plantar calcaneal spur. No acute osseous  abnormalities. Electronically Signed   By: Lavonia Dana M.D.   On: 08/02/2021 20:51     PHYSICAL EXAM  Temp:  [98.1 F (36.7 C)-98.8 F (37.1 C)] 98.2 F (36.8 C) (07/26 1600) Pulse Rate:  [78-121] 93 (07/26 1845) Resp:  [13-37] 30 (07/26 1845) BP: (128-206)/(66-99) 128/68 (07/26 1800) SpO2:  [95 %-100 %] 98 % (  07/26 1845) Arterial Line BP: (112-187)/(45-99) 121/49 (07/26 1845) FiO2 (%):  [40 %-60 %] 60 % (07/26 1542) Weight:  [83.9 kg] 83.9 kg (07/26 0914)  General - Well nourished, well developed, intubated on Precedex.  Ophthalmologic - fundi not visualized due to noncooperation.  Cardiovascular - Regular rate and rhythm.  Neuro - intubated on Precedex, lethargic, eyes closed but easily open on voice, following midline commands such eye movement, eyes and mouth open and close. With eye opening, eyes in mid position, blinking to visual threat, tracking bilaterally, PERRL. Corneal reflex present, gag and cough present. Breathing over the vent.  Facial symmetry not able to test due to ET tube.  Tongue protrusion not cooperative. On pain stimulation, no movement in all extremities. No babinski. Sensation, coordination not corporative and gait not tested.   ASSESSMENT/PLAN Joshua Donovan is a 66 y.o. male with history of remote TBI with ICH, hypertension, hyperlipidemia, diabetes admitted for right leg/foot weakness and pain, right hand numbness, imbalance.  Gradually developed left leg weakness also during admission.  No tPA given due to outside window.    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, severe stenosis right VBG, proximal and distal PA, bilateral P2, A2 and left ICA siphon MRI  x 2 bilateral ventral valvular infarct S/p IR with BA and right VBJ stenting MRI repeat pending  Respiratory failure Reintubated right after extubation On ventilation CCM on board On Precedex  Diabetes HgbA1c 8.3 goal < 7.0 Controlled CBG monitoring SSI DM education and close PCP follow up  Hypertension unstable BP goal 01/30/1938 for 24 hours status post IR On Cleviprex Long term BP goal normotensive  Hyperlipidemia Home meds: Lipitor 20 LDL 116, goal < 70 Now on Lipitor 80 Continue statin at discharge  Other Stroke Risk Factors Advanced age Former cigarette smoker quit smoking 33 years ago ETOH use, 1 drink per day  Other Active Problems Remote TBI with traumatic [facility] day # 1  This patient is critically ill due to brainstem stroke with now quadriplegic, respite failure and at significant risk of neurological worsening, death form 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 This patient's care requires constant monitoring of vital signs, hemodynamics, respiratory and cardiac monitoring, review of multiple databases, neurological assessment, discussion with family, other specialists and medical decision making of high complexity. I spent 35 minutes of neurocritical care time in the care of this patient.   Rosalin Hawking, MD PhD Stroke Neurology 08/04/2021 7:00 PM    To contact Stroke Continuity provider, please refer to [email or web address] After hours, contact General Neurology

## 2021-08-04 NOTE — Anesthesia Postprocedure Evaluation (Signed)
Anesthesia Post Note  Patient: Joshua Donovan  Procedure(s) Performed: Angioplasty/stenting of vertebrobasilar stenosis     Patient location during evaluation: PACU Anesthesia Type: General Level of consciousness: awake Pain management: pain level controlled Vital Signs Assessment: post-procedure vital signs reviewed and stable Respiratory status: patient on ventilator - see flowsheet for VS, patient remains intubated per anesthesia plan and patient re-intubated Cardiovascular status: blood pressure returned to baseline Anesthetic complications: no Comments:  Decision made to leave patient intubated following procedure. Transferred to PACU on ventilator. In PACU, patient was awake, following commands, breathing spontaneously. Decision made in conjunction with CCM to trial extubation in PACU. Immediately after extubation patient was gasping, taking rapid, shallow breaths. Decision made to reintubate. Given 100 mg propofol and 80 mg succinylcholine. Atraumatic intubation with glidescope by CRNA. Will remain intubated and transfer to ICU.    No notable events documented.  Last Vitals:  Vitals:   08/04/21 1330 08/04/21 1345  BP: 137/77 (!) 151/84  Pulse: 97 (!) 106  Resp: (!) 26 (!) 24  Temp:    SpO2: 97% 97%    Last Pain:  Vitals:   08/04/21 0810  TempSrc:   PainSc: 0-No pain                 Lidia Collum

## 2021-08-04 NOTE — Procedures (Signed)
INTERVENTIONAL NEURORADIOLOGY BRIEF POSTPROCEDURE NOTE   DIAGNOSTIC CEREBRAL ANGIOGRAM    Attending: Dr. Frazier Richards   Assistant: Dr. 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   Complications: none.   Estimated blood loss: 50 mL   Specimen: None.   Findings:  Occlusion of Lt V4 distal to origin of PICA Severe 90% stenosis of Rt vertebro-basilar junction, PTA with 2.25 mm x 15 mm Apex Monorail balloon followed by Resolute Onyx 3 mm x 22 mm stent placement with excellent results. Bil P-comm arteries are patent.   The patient tolerated the procedure well without incident and is being transferred to ICU in a stable condition.    PLAN: - Bed rest x6 hours post femoral puncture - SBP 120-140 mmHg  -Low dose heparin to maintain the PTT at 50-60 -Brilinta 90 mg tonight and then 90 mg bid with aspirin 91 mg daily from tomorrow

## 2021-08-04 NOTE — Progress Notes (Addendum)
ANTICOAGULATION CONSULT NOTE - Initial Consult  Pharmacy Consult for Heparin infusion Indication:  post-IR cerebral angiogram  Allergies  Allergen Reactions   Lisinopril Swelling   Ace Inhibitors Swelling   Anchovies [Fish Allergy] Swelling   Sulfa Antibiotics Swelling    Patient Measurements: Height: '5\' 7"'$  (170.2 cm) Weight: 83.9 kg (185 lb) IBW/kg (Calculated) : 66.1 Heparin Dosing Weight: 83 kg  Vital Signs: Temp: 98.1 F (36.7 C) (07/26 0834) Temp Source: Oral (07/26 0834) BP: 146/79 (07/26 1445) Pulse Rate: 116 (07/26 1445)  Labs: Recent Labs    08/02/21 2027 08/03/21 0347 08/03/21 1129 08/03/21 1809  HGB 14.2  --   --   --   HCT 43.0  --   --   --   PLT 296  --   --   --   APTT  --   --  28  --   HEPARINUNFRC  --   --   --  0.16*  CREATININE 0.70 0.76  --   --     Estimated Creatinine Clearance: 94 mL/min (by C-G formula based on SCr of 0.76 mg/dL).   Medical History: Past Medical History:  Diagnosis Date   Diabetes mellitus without complication ([place])    GERD (gastroesophageal reflux disease)    Hemorrhage in the brain ([place]) 08/2015   High cholesterol    Hyperlipidemia    Hypertension    Hypertensive emergency 08/03/2021    Medications:  Infusions:   sodium chloride 10 mL/hr at 08/04/21 0920   ceFAZolin     clevidipine 21 mg/hr (08/04/21 1401)   dexmedetomidine (PRECEDEX) IV infusion     fentaNYL     fentaNYL infusion INTRAVENOUS 25 mcg/hr (08/04/21 1442)   heparin     heparin 10 Units/kg/hr (08/04/21 1431)    Assessment: 66 yo M transferred to [facility] on 7/26 for cerebral angiogram. Cerebral angiogram with 90% severe stenosis of R vertebro-basilar junction s/p stent. Noted L V4 occlusion distal to origin of PICA. Heparin gtt initiated in IR by neurosurgery. Pharmacy consulted to manage heparin infusion with aPTT goal of 50-60.   Pt not on any oral anticoagulation prior to admission.   Goal of Therapy:  aPTT 50-60 seconds Monitor  platelets by anticoagulation protocol: Yes   Plan:  Continue heparin infusion at current rate of 850 units/hr from IR.  Check aPTT in 6 hours Monitor daily CBC, aPTT, and s/sx of bleeding  F/u neuro recs for anticoagulation plan  Kaleen Mask 08/04/2021,3:01 PM

## 2021-08-04 NOTE — Consult Note (Signed)
NAME:  Joshua Donovan, MRN:  [id number], DOB:  [birth year], LOS: 1 ADMISSION DATE:  08/03/2021, CONSULTATION DATE:  08/04/21 REFERRING MD:  Dr. Gerhard Perches, CHIEF COMPLAINT:  CVA   History of Present Illness:   66 year old male with prior history of HTN, TBI with prior ICH, DM, and HLD who presented to [facility] 7/24 with right hand numbness and right LE pain and weakness.  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  He was admitted for further stroke workup.  His weakness worsened to include right arm, right leg and left leg.  CTA angio head revealed multiple intracranial severe stenoses, near- occlusive stenosis of the vertebrobasilar junction, occlusion of the distal left V4, moderate mid basilar stenosis, nearly occlusive bilateral PCA P2 segments, nearly occlusive distal right ACA A2 segments and bulky soft plaque or thrombus in the supraclinoid left ICA with mild to moderate right M1 stenosis.  He was transferred to [facility] for further stroke care and Neuro IR evaluation on 7/25.  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  Additionally noted occlusion of left V4 distal to origin of PICA and patent bilateral P-comm arteries.  Heparin gtt ordered as well as plavix.  Remains on cleviprex gtt for strict SBP parameters.  Patient left intubated but weaning in PACU while awaiting ICU bed.  PCCM consulted for further ventilator management.    Pertinent  Medical History  TBI with ICH, DM, HTN, HLD  Significant Hospital Events: Including procedures, antibiotic start and stop dates in addition to other pertinent events   7/24 presented to [facility], ventral medulla CVA 7/25 tx to [facility] 7/26 cerebral angiogram with stent placement to right vertebro-basilar junction, failed extubation, left radial aline  Interim History / Subjective:  Propofol has been off and pt weaning PSV 10/5 Not spont moving any  extremities Remains on Cleviprex  Objective   Blood pressure (!) 142/82, pulse 84, temperature 98.7 F (37.1 C), temperature source Oral, resp. rate 16, height 5' 7"  (1.702 m), weight 83.9 kg, SpO2 100 %.        Intake/Output Summary (Last 24 hours) at 08/04/2021 1311 Last data filed at 08/04/2021 1158 Gross per 24 hour  Intake 600 ml  Output 630 ml  Net -30 ml   Filed Weights   08/04/21 0914  Weight: 83.9 kg   Examination: General:  Adult male lying supine in bed in NAD, intubated on MV HEENT: MM pink/moist, pupils 3/reactive, ETT, no OGT Neuro:  open eyes to verbal and able to blink twice to command, no movement spont or to noxious stimuli in extremities CV: rr, ST, no obvious murmur, right femoral site wnl/ dressing dry, +2distal pulses, left radial aline PULM:  non labored on 10/5 with TV 550, decreased to PSV 5/5 with RSBI 55, CTA, strong cough, no secretions GI: soft, bs+, foley Extremities: warm/dry, no LE edema  Skin: no rashes   Resolved Hospital Problem list    Assessment & Plan:   Acute respiratory insufficiency related to below - weaning well on PSV 5/5 with RSBI 55, is awake but only able to follow commands with his eyes at this time.  Discussed with attending and bedside anesthesia MD.  Decision made for trial at extubation.  Patient immediately failed> unable to protect upper airway, severe WOB/abdominal breathing requiring emergent reintubation in PACU by anesthesia.   - full MV support, PRVC,  4-8cc/kg IBW with goal Pplat <30  and DP<15  - VAP prevention protocol/ PPI - PAD protocol for sedation> change to precedex/ fentanyl with scheduled bowel regimen - wean FiO2 as able for SpO2 >92%  - depending on neuro recovery/ prognosis, consider early trach/ PEG  Brainstem stroke involving the ventral medulla s/p stent placement to right vertebro-basilar junction  Severe intracranial atherosclerosis - per neuro and neuro IR - flat x 6hr post intervention/ sheath  removal - Strict SBP goal 120-140, cleviprex ceiling max 107m, and prn labetalol  -  TTE EF 60-65%, no WMA, G1DD, normal RV/ valves - heparin gtt per pharmacy with PT goal 50-60 - Brilinta/ ASA per NIR/ Neuro - serial neuro checks - further imaging per Neuro  HTN emergency BP 219/96 on presentation.  On home lisinopril 465mand lopressor 2532maily - strict SBP goals as above - hold home HTN meds for now  HLD - continue high dose statin  DM A1c 8.3 - SSI q 4   ?hx of ETOH abuse - unable to elicit hx.  Precedex for now and monitor for withdrawals - empiric thiamine/ folate/ MVI  Best Practice (right click and "Reselect all SmartList Selections" daily)   Diet/type: NPO; start TF per RD recs DVT prophylaxis: systemic heparin GI prophylaxis: PPI Lines: N/A Foley:  Yes, and it is still needed Code Status:  full code Last date of multidisciplinary goals of care discussion [per primary]  Labs   CBC: Recent Labs  Lab 08/02/21 2027  WBC 6.4  NEUTROABS 4.3  HGB 14.2  HCT 43.0  MCV 85.5  PLT 296419 Basic Metabolic Panel: Recent Labs  Lab 08/02/21 2027 08/03/21 0347  NA 136 136  K 3.1* 3.4*  CL 101 101  CO2 25 24  GLUCOSE 182* 212*  BUN 14 11  CREATININE 0.70 0.76  CALCIUM 9.9 9.0  MG  --  1.6*   GFR: Estimated Creatinine Clearance: 94 mL/min (by C-G formula based on SCr of 0.76 mg/dL). Recent Labs  Lab 08/02/21 2027  WBC 6.4    Liver Function Tests: Recent Labs  Lab 08/02/21 2027  AST 26  ALT 28  ALKPHOS 29*  BILITOT 0.5  PROT 7.3  ALBUMIN 4.2   No results for input(s): "LIPASE", "AMYLASE" in the last 168 hours. No results for input(s): "AMMONIA" in the last 168 hours.  ABG No results found for: "PHART", "PCO2ART", "PO2ART", "HCO3", "TCO2", "ACIDBASEDEF", "O2SAT"   Coagulation Profile: No results for input(s): "INR", "PROTIME" in the last 168 hours.  Cardiac Enzymes: No results for input(s): "CKTOTAL", "CKMB", "CKMBINDEX", "TROPONINI" in  the last 168 hours.  HbA1C: Hemoglobin A1C  Date/Time Value Ref Range Status  02/20/2020 04:24 PM 10.8 (A) 4.0 - 5.6 % Final  01/22/2019 12:13 PM 7.0 (A) 4.0 - 5.6 % Final   Hgb A1c MFr Bld  Date/Time Value Ref Range Status  08/03/2021 03:47 AM 8.3 (H) 4.8 - 5.6 % Final    Comment:    (NOTE) Pre diabetes:          5.7%-6.4%  Diabetes:              >6.4%  Glycemic control for   <7.0% adults with diabetes   07/19/2015 07:29 PM 7.0 (H) 4.0 - 6.0 % Final    CBG: Recent Labs  Lab 08/03/21 2232 08/04/21 0635 08/04/21 0809 08/04/21 1025 08/04/21 1206  GLUCAP 162* 176* 155* 174* 170*    Review of Systems:   Unable as patient is intubated.   Past  Medical History:  He,  has a past medical history of Diabetes mellitus without complication ([place]), GERD (gastroesophageal reflux disease), Hemorrhage in the brain [facility]) (08/2015), High cholesterol, Hyperlipidemia, Hypertension, and Hypertensive emergency (08/03/2021).   Surgical History:   Past Surgical History:  Procedure Laterality Date   COLONOSCOPY WITH PROPOFOL N/A 09/08/2017   Procedure: COLONOSCOPY WITH PROPOFOL;  Surgeon: Lucilla Lame, MD;  Location: [place];  Service: Endoscopy;  Laterality: N/A;  Diabetic - oral meds   partial intestine removed     approx date: 1980   POLYPECTOMY  09/08/2017   Procedure: POLYPECTOMY;  Surgeon: Lucilla Lame, MD;  Location: [place];  Service: Endoscopy;;     Social History:   reports that he quit smoking about 33 years ago. He has never used smokeless tobacco. He reports current alcohol use of about 7.0 standard drinks of alcohol per week. He reports that he does not use drugs.   Family History:  His family history includes Diabetes in his sister; Hypertension in his father.   Allergies Allergies  Allergen Reactions   Lisinopril Swelling   Ace Inhibitors Swelling   Anchovies [Fish Allergy] Swelling   Sulfa Antibiotics Swelling     Home Medications  Prior  to Admission medications   Medication Sig Start Date End Date Taking? Authorizing Provider  ACCU-CHEK FASTCLIX LANCETS MISC Use as directed twice daily dia E11.65 08/03/17   Lavera Guise, MD  atorvastatin (LIPITOR) 20 MG tablet Take 20 mg by mouth daily. 08/02/21   [provider]  atorvastatin (LIPITOR) 80 MG tablet Take 1 tablet (80 mg total) by mouth daily. 08/04/21   Wouk, Ailene Rud, MD  Blood Glucose Monitoring Suppl (ACCU-CHEK AVIVA PLUS) w/Device KIT Use as directed 10/04/17   Ronnell Freshwater, NP  cetirizine (ZYRTEC) 10 MG tablet Take 1 tablet (10 mg total) by mouth daily. 02/20/20   Lavera Guise, MD  glucose blood (ACCU-CHEK AVIVA PLUS) test strip Use two times daily to check blood sugar 10/05/17   Ronnell Freshwater, NP  lisinopril (ZESTRIL) 40 MG tablet Take 40 mg by mouth daily.    [provider]  metFORMIN (GLUCOPHAGE) 1000 MG tablet Take 1,000 mg by mouth 2 (two) times daily with a meal.    [provider]  metoprolol tartrate (LOPRESSOR) 25 MG tablet Take 1 tablet (25 mg total) by mouth daily. Patient not taking: Reported on 08/03/2021 02/20/20   Lavera Guise, MD     Critical care time: 65 mins     Kennieth Rad, ACNP [facility] 08/04/2021, 3:00 PM  See Amion for pager If no response to pager, please call PCCM consult pager After 7:00 pm call [place]

## 2021-08-04 NOTE — Anesthesia Procedure Notes (Signed)
Arterial Line Insertion Start/End7/26/2023 9:00 AM, 08/04/2021 9:05 AM Performed by: CRNA  Patient location: Pre-op. Preanesthetic checklist: patient identified, IV checked, site marked, risks and benefits discussed, surgical consent, monitors and equipment checked, pre-op evaluation, timeout performed and anesthesia consent Lidocaine 1% used for infiltration Left, radial was placed Catheter size: 20 G Hand hygiene performed  and maximum sterile barriers used   Attempts: 2 Procedure performed without using ultrasound guided technique. Following insertion, dressing applied and Biopatch. Post procedure assessment: normal  Patient tolerated the procedure well with no immediate complications.

## 2021-08-05 ENCOUNTER — Encounter (HOSPITAL_COMMUNITY): Payer: Self-pay | Admitting: Interventional Radiology

## 2021-08-05 DIAGNOSIS — E782 Mixed hyperlipidemia: Secondary | ICD-10-CM | POA: Diagnosis not present

## 2021-08-05 DIAGNOSIS — E119 Type 2 diabetes mellitus without complications: Secondary | ICD-10-CM | POA: Diagnosis not present

## 2021-08-05 DIAGNOSIS — E1165 Type 2 diabetes mellitus with hyperglycemia: Secondary | ICD-10-CM | POA: Diagnosis not present

## 2021-08-05 DIAGNOSIS — I639 Cerebral infarction, unspecified: Secondary | ICD-10-CM | POA: Diagnosis not present

## 2021-08-05 DIAGNOSIS — I10 Essential (primary) hypertension: Secondary | ICD-10-CM | POA: Diagnosis not present

## 2021-08-05 DIAGNOSIS — F1010 Alcohol abuse, uncomplicated: Secondary | ICD-10-CM | POA: Diagnosis not present

## 2021-08-05 LAB — GLUCOSE, CAPILLARY
Glucose-Capillary: 244 mg/dL — ABNORMAL HIGH (ref 70–99)
Glucose-Capillary: 203 mg/dL — ABNORMAL HIGH (ref 70–99)
Glucose-Capillary: 194 mg/dL — ABNORMAL HIGH (ref 70–99)
Glucose-Capillary: 179 mg/dL — ABNORMAL HIGH (ref 70–99)
Glucose-Capillary: 175 mg/dL — ABNORMAL HIGH (ref 70–99)
Glucose-Capillary: 146 mg/dL — ABNORMAL HIGH (ref 70–99)

## 2021-08-05 LAB — BASIC METABOLIC PANEL
Anion gap: 8 (ref 5–15)
BUN: 14 mg/dL (ref 8–23)
CO2: 25 mmol/L (ref 22–32)
Calcium: 7.9 mg/dL — ABNORMAL LOW (ref 8.9–10.3)
Chloride: 107 mmol/L (ref 98–111)
Creatinine, Ser: 0.88 mg/dL (ref 0.61–1.24)
GFR, Estimated: >60 mL/min (ref >60)
Glucose, Bld: 222 mg/dL — ABNORMAL HIGH (ref 70–99)
Potassium: 3.3 mmol/L — ABNORMAL LOW (ref 3.5–5.1)
Sodium: 140 mmol/L (ref 135–145)

## 2021-08-05 LAB — CBC
HCT: 40.3 % (ref 39.0–52.0)
Hemoglobin: 13.6 g/dL (ref 13.0–17.0)
MCH: 29.5 pg (ref 26.0–34.0)
MCHC: 33.7 g/dL (ref 30.0–36.0)
MCV: 87.4 fL (ref 80.0–100.0)
Platelets: 303 10*3/uL (ref 150–400)
RBC: 4.61 MIL/uL (ref 4.22–5.81)
RDW: 14.4 % (ref 11.5–15.5)
WBC: 12.5 10*3/uL — ABNORMAL HIGH (ref 4.0–10.5)
nRBC: 0 % (ref 0.0–0.2)

## 2021-08-05 LAB — PHOSPHORUS: Phosphorus: 2.9 mg/dL (ref 2.5–4.6)

## 2021-08-05 LAB — MAGNESIUM: Magnesium: 3 mg/dL — ABNORMAL HIGH (ref 1.7–2.4)

## 2021-08-05 LAB — APTT: aPTT: 28 seconds (ref 24–36)

## 2021-08-05 MED ORDER — FENTANYL CITRATE PF 50 MCG/ML IJ SOSY
25.0000 ug | PREFILLED_SYRINGE | INTRAMUSCULAR | Status: DC | PRN
  Administered 2021-08-05 – 2021-08-06 (×2): 50 ug via INTRAVENOUS
  Administered 2021-08-06 (×2): 100 ug via INTRAVENOUS
  Administered 2021-08-06 (×2): 50 ug via INTRAVENOUS
  Administered 2021-08-06: 100 ug via INTRAVENOUS
  Administered 2021-08-06 – 2021-08-07 (×3): 50 ug via INTRAVENOUS
  Filled 2021-08-05 (×3): qty 1
  Filled 2021-08-05 (×2): qty 2
  Filled 2021-08-05 (×4): qty 1
  Filled 2021-08-05: qty 2

## 2021-08-05 MED ORDER — POTASSIUM CHLORIDE 20 MEQ PO PACK
20.0000 meq | PACK | ORAL | Status: AC
  Administered 2021-08-05 (×2): 20 meq
  Filled 2021-08-05 (×2): qty 1

## 2021-08-05 MED ORDER — FENTANYL CITRATE PF 50 MCG/ML IJ SOSY
25.0000 ug | PREFILLED_SYRINGE | INTRAMUSCULAR | Status: DC | PRN
  Administered 2021-08-14: 25 ug via INTRAVENOUS
  Filled 2021-08-05: qty 1

## 2021-08-05 MED ORDER — PROSOURCE TF PO LIQD
45.0000 mL | Freq: Two times a day (BID) | ORAL | Status: DC
Start: 2021-08-05 — End: 2021-08-12
  Administered 2021-08-05 – 2021-08-12 (×12): 45 mL
  Filled 2021-08-05 (×12): qty 45

## 2021-08-05 MED ORDER — INSULIN ASPART 100 UNIT/ML IJ SOLN
0.0000 [IU] | INTRAMUSCULAR | Status: DC
  Administered 2021-08-05: 2 [IU] via SUBCUTANEOUS
  Administered 2021-08-05 (×2): 3 [IU] via SUBCUTANEOUS
  Administered 2021-08-05: 5 [IU] via SUBCUTANEOUS
  Administered 2021-08-05 – 2021-08-06 (×3): 3 [IU] via SUBCUTANEOUS
  Administered 2021-08-06: 8 [IU] via SUBCUTANEOUS
  Administered 2021-08-06 (×2): 3 [IU] via SUBCUTANEOUS
  Administered 2021-08-07: 5 [IU] via SUBCUTANEOUS
  Administered 2021-08-07: 8 [IU] via SUBCUTANEOUS
  Administered 2021-08-07: 3 [IU] via SUBCUTANEOUS

## 2021-08-05 MED ORDER — INSULIN DETEMIR 100 UNIT/ML ~~LOC~~ SOLN
5.0000 [IU] | Freq: Two times a day (BID) | SUBCUTANEOUS | Status: DC
Start: 2021-08-05 — End: 2021-08-07
  Administered 2021-08-05 – 2021-08-06 (×4): 5 [IU] via SUBCUTANEOUS
  Filled 2021-08-05 (×6): qty 0.05

## 2021-08-05 MED ORDER — ATORVASTATIN CALCIUM 80 MG PO TABS
80.0000 mg | ORAL_TABLET | Freq: Every day | ORAL | Status: DC
  Administered 2021-08-06 – 2021-11-26 (×113): 80 mg
  Filled 2021-08-05 (×114): qty 1

## 2021-08-05 MED ORDER — ASPIRIN 81 MG PO CHEW
81.0000 mg | CHEWABLE_TABLET | Freq: Every day | ORAL | Status: DC
  Administered 2021-08-05 – 2021-08-10 (×6): 81 mg
  Filled 2021-08-05 (×6): qty 1

## 2021-08-05 MED ORDER — HEPARIN SODIUM (PORCINE) 5000 UNIT/ML IJ SOLN
5000.0000 [IU] | Freq: Three times a day (TID) | INTRAMUSCULAR | Status: DC
  Administered 2021-08-05 – 2021-08-22 (×50): 5000 [IU] via SUBCUTANEOUS
  Filled 2021-08-05 (×50): qty 1

## 2021-08-05 MED ORDER — POTASSIUM CHLORIDE 10 MEQ/100ML IV SOLN
10.0000 meq | INTRAVENOUS | Status: AC
  Administered 2021-08-05 (×4): 10 meq via INTRAVENOUS
  Filled 2021-08-05 (×4): qty 100

## 2021-08-05 MED ORDER — MAGNESIUM SULFATE 4 GM/100ML IV SOLN
4.0000 g | Freq: Once | INTRAVENOUS | Status: AC
  Administered 2021-08-05: 4 g via INTRAVENOUS
  Filled 2021-08-05: qty 100

## 2021-08-05 MED ORDER — OSMOLITE 1.5 CAL PO LIQD
1000.0000 mL | ORAL | Status: DC
  Administered 2021-08-05: 1000 mL

## 2021-08-05 NOTE — Progress Notes (Signed)
STROKE TEAM PROGRESS NOTE   SUBJECTIVE (INTERVAL HISTORY) His care giver is at the bedside.  I also talked with pt daughter over the phone. Pt was on vent but awake alert and following midline commands. 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 Discussed with family and Dr. Tacy Learn, will proceed with early trach and PEG   OBJECTIVE Temp:  [97 F (36.1 C)-98 F (36.7 C)] 97.6 F (36.4 C) (07/27 2000) Pulse Rate:  [60-87] 64 (07/27 2200) Cardiac Rhythm: Normal sinus rhythm (07/27 2000) Resp:  [14-33] 18 (07/27 2200) BP: (91-150)/(61-85) 113/70 (07/27 2200) SpO2:  [97 %-100 %] 99 % (07/27 2200) Arterial Line BP: (113-162)/(53-103) 144/64 (07/27 2200) FiO2 (%):  [40 %] 40 % (07/27 1944)  Recent Labs  Lab 08/05/21 0322 08/05/21 0831 08/05/21 1145 08/05/21 1638 08/05/21 1923  GLUCAP 244* 203* 194* 179* 175*   Recent Labs  Lab 08/02/21 2027 08/03/21 0347 08/05/21 0531 08/05/21 1619  NA 136 136 140  --   K 3.1* 3.4* 3.3*  --   CL 101 101 107  --   CO2 '25 24 25  '$ --   GLUCOSE 182* 212* 222*  --   BUN '14 11 14  '$ --   CREATININE 0.70 0.76 0.88  --   CALCIUM 9.9 9.0 7.9*  --   MG  --  1.6*  --  3.0*  PHOS  --   --   --  2.9   Recent Labs  Lab 08/02/21 2027  AST 26  ALT 28  ALKPHOS 29*  BILITOT 0.5  PROT 7.3  ALBUMIN 4.2   Recent Labs  Lab 08/02/21 2027 08/05/21 0531  WBC 6.4 12.5*  NEUTROABS 4.3  --   HGB 14.2 13.6  HCT 43.0 40.3  MCV 85.5 87.4  PLT 296 303   No results for input(s): "CKTOTAL", "CKMB", "CKMBINDEX", "TROPONINI" in the last 168 hours. No results for input(s): "LABPROT", "INR" in the last 72 hours. No results for input(s): "COLORURINE", "LABSPEC", "PHURINE", "GLUCOSEU", "HGBUR", "BILIRUBINUR", "KETONESUR", "PROTEINUR", "UROBILINOGEN", "NITRITE", "LEUKOCYTESUR" in the last 72 hours.  Invalid input(s): "APPERANCEUR"      Component Value Date/Time   CHOL 203 (H) 08/03/2021 0347   TRIG 175 (H) 08/03/2021 0347   HDL 52 08/03/2021  0347   CHOLHDL 3.9 08/03/2021 0347   VLDL 35 08/03/2021 0347   LDLCALC 116 (H) 08/03/2021 0347   Lab Results  Component Value Date   HGBA1C 8.3 (H) 08/03/2021   No results found for: "LABOPIA", "COCAINSCRNUR", "LABBENZ", "AMPHETMU", "THCU", "LABBARB"  No results for input(s): "ETH" in the last 168 hours.  I have personally reviewed the radiological images below and agree with the radiology interpretations.  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 He presented with progressively worsening weakness in the right lower extremity, left lower extremity, right upper extremity, and left upper extremity. On the morning of the procedure, patient was flaccid at the right-sided extremities and had severe weakness in the left-sided extremities (grade 1/5). 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 There was occlusion of the left vertebral artery seen distal to the origin of the PICA. MRI of the brain demonstrated ischemic infarction  of the ventral medulla slightly greater on the left. EXAM: 1.  Biplane right carotid and cerebral DSA angiogram. 2.  Biplane left common carotid and cerebral DSA cerebral angiogram 3. Indirect left vertebral and cerebral angiogram with tip of the catheter at the origin of the left vertebral artery 4.  Right subclavian arteriogram. 5. Selective catheterization of the right vertebral artery, biplane vertebral and cerebral DSA cerebral angiogram. 6. Selective catheterization of the right vertebral artery by using the 088 neuron max guide catheter, 5 French AXS catalyst catheter as an  intermediate catheter and 021 phenom microcatheter with a 014 Arostotle microwire. 7. High magnification biplane cerebral angiogram and exchange of the microcatheter over an exchange length 014 zoom wire into an Apex 2.25 x 15 mm monorail balloon catheter and angioplasty of the severely 90% stenotic right vertebrobasilar junction. 8. Resolute onyx 3 mm x 22 mm stent placement at the site of the stenosis. 9. Post stent biplane DSA cerebral angiography of the right vertebral artery injection. 10. Right common femoral arteriogram in the RAO projection after withdrawing the sheath with its tip in the external iliac artery and arteriogram Attending: Dr. Frazier Richards Assistant: Dr. Luanne Bras COMPARISON:  CORRELATION IS MADE WITH CTA OF THE HEAD AND NECK DATED August 03, 2021 MEDICATIONS: The antibiotic was administered within 1 hour of the procedure Injection Ancef 2 g Injection Integrilin 4.5 mg at 1.5 mg increments intra-arterially through the right vertebral artery Heparin 3500 units ANESTHESIA/SEDATION: Anesthesia was provided by the anesthesia team. Please refer to the anesthesia notes for detailed information. CONTRAST:  126 mL of Omnipaque 300 FLUOROSCOPY: Fluoroscopy Time: 48 minutes 48 seconds (3499 mGy). COMPLICATIONS: None immediate. TECHNIQUE: Informed written consent was obtained from the patient and his daughter after a thorough discussion of the procedural risks including but not limited to approximately 5% bleeding, hematoma, vascular injury, worsening of the stroke, ventilator dependency etc. Benefits and alternatives. All questions were addressed. Maximal Sterile Barrier Technique was utilized including caps, mask, sterile gowns, sterile gloves, sterile drape, hand hygiene and skin antiseptic. A timeout was performed prior to the initiation of the procedure. PROCEDURE: The right groin was prepped and draped in the usual sterile fashion. Thereafter using modified Seldinger technique, transfemoral  access into the right common femoral artery was obtained without difficulty. Over a 0.035 inch guidewire, an 8 French x 25 cm pinnacle sheath was inserted. Through this, and also over 0.035 inch roadrunner wire, a 5 [place] JB 1 catheter was advanced to the aortic arch region and selectively positioned in the right common carotid artery, and biplane DSA carotid and cerebral angiogram was obtained. Next, with the catheter tip in the left common carotid artery, biplane DSA carotid and cerebral Angiography of the head was obtained. The, selective catheterization of the left subclavian artery followed by angiogram was performed. With the tip of the catheter at the origin of the left vertebral artery, indirect left vertebral and biplane DSA cerebral angiography of left subclavian arterial injection was performed. Next, selective catheterization of the right subclavian artery was performed and the catheter was advanced into the proximal subclavian artery. Next, the wire was exchanged for an exchange length 035 Rosen wire. The JB 1 catheter was removed and 088 neuron max guide catheter and 5 French AXS catalyst catheter were advanced and selective catheterization of the left vertebral artery followed by vertebral and cerebral angiogram was performed. Next, under high magnification angiography, and under roadmap guidance, phenom 21 microcatheter and 0 1 for restarting microwire were  advanced into the left posterior cerebral artery and angiogram was performed. Next, the wire was exchanged for a 014 zoom exchange length wire. Subsequently, we selected 2.25 x 15 mm Apex monorail microcatheter and under roadmap guidance, angioplasty of the severely stenotic right vertebrobasilar artery was performed. Post angioplasty angiogram was performed. Next, the balloon was removed. Measurements were made. Next we selected 3 mm x 22 mm resolute onyx stent and the stent was deployed at the right vertebrobasilar stenotic site. The stent was  gently dilated and was inflated up to 10 atmospheric pressure. Next, post stent biplane DSA cerebral angiogram was performed. At this point in time, we felt the goal of the procedure was obtained. The catheter was removed. Femoral access site was further sterilely cleaned and the femoral sheath was withdrawn with its tip in the right external iliac artery, femoral angiogram was performed in the RAO projection and the femoral access site was closed by using an 8 [place] Angio-Seal. Patient tolerated the procedure well and was transferred to the ICU intubated in stable condition. No immediate complications. FINDINGS: Right CCA: The right common carotid arteriogram demonstrates the right external carotid artery and its major branches to be widely patent. Right ICA: The right internal carotid artery at the bulb to the cranial skull base opacifies normally. No significant stenosis. Right Intracranial: The petrous and cavernous segments are widely patent. Mild 20-30% stenosis at the supraclinoid ICA. The right middle cerebral artery and the right anterior cerebral artery opacify normally into the capillary and venous phases. Mild-to-moderate atheromatous disease with about 30% stenosis in the right M1 segment. There is severe about 70% stenosis of the right pericallosal branch of the ACA seen in the proximal aspect. There is cross filling via the anterior communicating artery seen with opacification of the left anterior cerebral artery. PCOM is patent with faint opacification of the right posterior cerebral artery. Right Sided Anterior Venous: The venous phase demonstrates preferential egress of contrast into the right transverse sinus, the sigmoid sinus and the right internal jugular vein. There is no early venous shunting into the venous structures at the skull base or intracranially into the dural sinuses. Left CCA: The left common carotid arteriogram demonstrates the left external carotid artery and its major branches  to be widely patent. Left ICA: The left internal carotid artery at the bulb to the cranial skull base opacifies normally. Mild atheromatous disease at the origin of the ICA. There is mild stenosis seen about 2 cm from the origin of the left ICA measuring 42 percent by the NASCET criteria. Left Intracranial: The petrous, cavernous and supraclinoid segments are widely patent. The left middle cerebral artery and the left anterior cerebral artery opacify normally into the capillary and venous phases. There is a prominent PCOM opacifying the bilateral posterior cerebral arteries via the distal basilar artery. Left Sided Anterior Venous: The venous phase demonstrates preferential egress of contrast into the right transverse sinus, the sigmoid sinus and the right internal jugular vein. There is opacification of the left transverse sigmoid sinuses and internal jugular veins seen as well. There is no early venous shunting into the venous structures at the skull base or intracranially into the dural sinuses. Indirect left vertebral and cerebral angiogram with injection of the left subclavian artery demonstrate faint opacification of the left vertebral artery. 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 High magnification biplane DSA cerebral angiography of right vertebral artery injection demonstrate severe about 90% stenosis at the right vertebrobasilar  junction in length of about 7 mm. Post angioplasty angiogram demonstrate significant residual stenoses. Post stent angiogram demonstrate complete expansion of the stent with no residual stenosis. There is about 40-50% stenosis seen at the mid basilar artery. Right PICA, bilateral superior cerebellar, AICA and posterior cerebral arteries show moderate atheromatous disease. No significant stenosis. IMPRESSION: 1. Right vertebral and cerebral angiogram demonstrated about 90% stenosis at the vertebrobasilar junction. Successful angioplasty and  stent placement without significant residual stenosis. 2.  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 3. 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 4. Mild atheromatous disease in the right intracranial ICA with 20-30% stenosis at the supraclinoid ICA and 30% stenosis in the right M1 segment. There is about 70% stenoses of the right pericallosal branch of the ACA seen in the A3 segment. 5.  Bilateral P comms are patent with the left PCOM being prominent. PLAN: 1.  Blood pressure goals between 120-140 mm Hg. 2.  Patient to be on dual antiplatelet agents for 6 months. Electronically Signed   By: Frazier Richards M.D.   On: 08/05/2021 14:38   MR BRAIN WO CONTRAST  Result Date: 08/05/2021 CLINICAL DATA:  66 year old male with history of known medullary infarct and severe vertebrobasilar disease, status post catheter directed revascularization and stenting. EXAM: MRI HEAD WITHOUT CONTRAST MRA HEAD WITHOUT CONTRAST TECHNIQUE: Multiplanar, multi-echo pulse sequences of the brain and surrounding structures were acquired without intravenous contrast. Angiographic images of the Circle of Willis were acquired using MRA technique without intravenous contrast. COMPARISON:  Comparison made with arteriogram from earlier the same day as well as multiple previous studies. FINDINGS: MRI HEAD FINDINGS Brain: There has been interval expansion of previously identified ventral medullary infarct, now extending posteriorly to traversed the entirety of the medulla to the floor of the fourth ventricle (series 5, images 63, 64, 65), slightly worse on the left. Additionally, there are new scattered patchy ischemic infarcts involving the right cerebellum. Few additional patchy small volume ischemic infarcts noted involving the cortical aspects of the right greater than left occipital lobes (series 5, images 79, 77, 75, 73, 72 no significant associated mass effect. Chronic right cerebellar microhemorrhage  noted. There is a new small focus of susceptibility artifact within the adjacent right cerebellum, likely a punctate focus of petechial blood products (series 14, image 20). No other associated blood products. Remainder the brain is otherwise stable in appearance. Stable cerebral volume with chronic small vessel ischemic disease. No new focal parenchymal signal abnormality. Pituitary gland and suprasellar region remain within normal limits. Midline encephalomalacia with chronic hemosiderin staining at the anterior right frontal lobe again noted. Remote lacunar infarcts again noted about the cerebellum, pons, thalami, and left posterior corona radiata. No mass lesion, significant mass effect, or midline shift. Stable ventricular size without hydrocephalus. No extra-axial fluid collection. Vascular: Susceptibility artifact now seen about the vertebrobasilar junction related to interval stenting. Major intracranial vascular flow voids are maintained. Skull and upper cervical spine: Craniocervical junction within normal limits. Bone marrow signal intensity normal. No new scalp soft tissue abnormality. Sinuses/Orbits: Globes and orbital soft tissues within normal limits. Moderate mucosal thickening throughout the paranasal sinuses with fluid within the nasopharynx. Patient is intubated. Mastoid air cells remain largely clear. Other: None. MRA HEAD FINDINGS Anterior circulation: Visualized distal cervical segments of the internal carotid arteries remain patent with antegrade flow. Petrous segments remain widely patent. Atheromatous irregularity about both carotid siphons with associated moderate stenosis at the supraclinoid left  ICA. A1 segments patent. Normal anterior communicating artery complex. Left ACA remains patent. Distal severe right A2/A3 stenosis noted (series 1, image 164), stable. Left M1 segment remains patent. Mild to moderate long segment right M1 stenosis again noted. Negative MCA bifurcations. No  proximal MCA branch occlusion. Distal MCA branches remain perfused and symmetric, although demonstrate mild small vessel atheromatous irregularity. Posterior circulation: Heterogeneous atheromatous irregularity again noted about both proximal V4 segments as they course into the cranial vault. Multifocal moderate stenoses involving the proximal left V4 segment again noted. Left V4 remains patent to the takeoff of the left PICA which remains patent and well perfused. Occlusion of the distal left V4 segment beyond the left PICA again noted, stable. On the right, there has been interval placement of a vascular stent extending from the distal right V4 segment across the vertebrobasilar junction into the proximal basilar artery. This traverses the previously seen high-grade stenosis at this level. Grossly patent flow through the stent, although evaluation for possible intra stent stenosis limited by MRA. Stable and fairly robust flow seen within the basilar artery distally. Suspected short-segment fenestration noted at the distal basilar artery. Superior cerebellar arteries remain patent. Right PCA primarily supplied via the basilar. Left PCA supplied via the basilar as well as a robust left posterior communicating artery. Atheromatous disease involving both PCAs with associated severe bilateral P2 stenoses. PCAs remain patent to their distal aspects although demonstrate small vessel atheromatous irregularity. Anatomic variants: None significant. IMPRESSION: MRI HEAD IMPRESSION: 1. 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 Additionally, there are new scattered small volume ischemic infarcts involving the right cerebellum as well as the cortical aspects of the right greater than left occipital lobes. No associated mass effect. Single punctate focus of associated petechial hemorrhage at the right cerebellum  as above. 2. Otherwise stable appearance of the brain with atrophy, chronic microvascular ischemic disease, and multiple chronic ischemic infarcts as above. MRA HEAD IMPRESSION: 1. Interval placement of a vascular stent extending from the distal right V4 segment across the vertebrobasilar junction into the proximal basilar artery. Grossly patent flow through the stent, although evaluation for possible intra stent stenosis is limited by MRA. Stable and fairly robust flow seen within the basilar artery distally. 2. Otherwise stable appearance of the intracranial circulation with moderate multifocal stenoses involving the proximal left V4 segment with distal left V4 occlusion, severe right A2/A3 stenosis, severe bilateral P2 stenoses, moderate supraclinoid left ICA stenosis, with mild to moderate long segment right M1 stenosis. Electronically Signed   By: Jeannine Boga M.D.   On: 08/05/2021 02:44   MR ANGIO HEAD WO CONTRAST  Result Date: 08/05/2021 CLINICAL DATA:  66 year old male with history of known medullary infarct and severe vertebrobasilar disease, status post catheter directed revascularization and stenting. EXAM: MRI HEAD WITHOUT CONTRAST MRA HEAD WITHOUT CONTRAST TECHNIQUE: Multiplanar, multi-echo pulse sequences of the brain and surrounding structures were acquired without intravenous contrast. Angiographic images of the Circle of Willis were acquired using MRA technique without intravenous contrast. COMPARISON:  Comparison made with arteriogram from earlier the same day as well as multiple previous studies. FINDINGS: MRI HEAD FINDINGS Brain: There has been interval expansion of previously identified ventral medullary infarct, now extending posteriorly to traversed the entirety of the medulla to the floor of the fourth ventricle (series 5, images 63, 64, 65), slightly worse on the left. Additionally, there are new scattered patchy ischemic infarcts involving  the right cerebellum. Few additional  patchy small volume ischemic infarcts noted involving the cortical aspects of the right greater than left occipital lobes (series 5, images 79, 77, 75, 73, 72 no significant associated mass effect. Chronic right cerebellar microhemorrhage noted. There is a new small focus of susceptibility artifact within the adjacent right cerebellum, likely a punctate focus of petechial blood products (series 14, image 20). No other associated blood products. Remainder the brain is otherwise stable in appearance. Stable cerebral volume with chronic small vessel ischemic disease. No new focal parenchymal signal abnormality. Pituitary gland and suprasellar region remain within normal limits. Midline encephalomalacia with chronic hemosiderin staining at the anterior right frontal lobe again noted. Remote lacunar infarcts again noted about the cerebellum, pons, thalami, and left posterior corona radiata. No mass lesion, significant mass effect, or midline shift. Stable ventricular size without hydrocephalus. No extra-axial fluid collection. Vascular: Susceptibility artifact now seen about the vertebrobasilar junction related to interval stenting. Major intracranial vascular flow voids are maintained. Skull and upper cervical spine: Craniocervical junction within normal limits. Bone marrow signal intensity normal. No new scalp soft tissue abnormality. Sinuses/Orbits: Globes and orbital soft tissues within normal limits. Moderate mucosal thickening throughout the paranasal sinuses with fluid within the nasopharynx. Patient is intubated. Mastoid air cells remain largely clear. Other: None. MRA HEAD FINDINGS Anterior circulation: Visualized distal cervical segments of the internal carotid arteries remain patent with antegrade flow. Petrous segments remain widely patent. Atheromatous irregularity about both carotid siphons with associated moderate stenosis at the supraclinoid left ICA. A1 segments patent. Normal anterior communicating  artery complex. Left ACA remains patent. Distal severe right A2/A3 stenosis noted (series 1, image 164), stable. Left M1 segment remains patent. Mild to moderate long segment right M1 stenosis again noted. Negative MCA bifurcations. No proximal MCA branch occlusion. Distal MCA branches remain perfused and symmetric, although demonstrate mild small vessel atheromatous irregularity. Posterior circulation: Heterogeneous atheromatous irregularity again noted about both proximal V4 segments as they course into the cranial vault. Multifocal moderate stenoses involving the proximal left V4 segment again noted. Left V4 remains patent to the takeoff of the left PICA which remains patent and well perfused. Occlusion of the distal left V4 segment beyond the left PICA again noted, stable. On the right, there has been interval placement of a vascular stent extending from the distal right V4 segment across the vertebrobasilar junction into the proximal basilar artery. This traverses the previously seen high-grade stenosis at this level. Grossly patent flow through the stent, although evaluation for possible intra stent stenosis limited by MRA. Stable and fairly robust flow seen within the basilar artery distally. Suspected short-segment fenestration noted at the distal basilar artery. Superior cerebellar arteries remain patent. Right PCA primarily supplied via the basilar. Left PCA supplied via the basilar as well as a robust left posterior communicating artery. Atheromatous disease involving both PCAs with associated severe bilateral P2 stenoses. PCAs remain patent to their distal aspects although demonstrate small vessel atheromatous irregularity. Anatomic variants: None significant. IMPRESSION: MRI HEAD IMPRESSION: 1. 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 Additionally, there are new scattered small volume  ischemic infarcts involving the right cerebellum as well as the cortical aspects of the right greater than left occipital lobes. No associated mass effect. Single punctate focus of associated petechial hemorrhage at the right cerebellum as above. 2. Otherwise stable appearance of the brain with atrophy, chronic microvascular ischemic disease,  and multiple chronic ischemic infarcts as above. MRA HEAD IMPRESSION: 1. Interval placement of a vascular stent extending from the distal right V4 segment across the vertebrobasilar junction into the proximal basilar artery. Grossly patent flow through the stent, although evaluation for possible intra stent stenosis is limited by MRA. Stable and fairly robust flow seen within the basilar artery distally. 2. Otherwise stable appearance of the intracranial circulation with moderate multifocal stenoses involving the proximal left V4 segment with distal left V4 occlusion, severe right A2/A3 stenosis, severe bilateral P2 stenoses, moderate supraclinoid left ICA stenosis, with mild to moderate long segment right M1 stenosis. Electronically Signed   By: Jeannine Boga M.D.   On: 08/05/2021 02:44   DG Abd 1 View  Result Date: 08/04/2021 CLINICAL DATA:  NG tube placement EXAM: ABDOMEN - 1 VIEW COMPARISON:  10/11/2012 FINDINGS: Tip of NG tube is seen in the region of the antrum of the stomach. Bowel gas pattern is nonspecific. Pelvis is not included in its entirety. IMPRESSION: Tip of NG tube is seen in the stomach. Electronically Signed   By: Elmer Picker M.D.   On: 08/04/2021 17:32   DG Chest Port 1 View  Result Date: 08/04/2021 CLINICAL DATA:  Intubated EXAM: PORTABLE CHEST 1 VIEW COMPARISON:  08/04/2021 FINDINGS: Endotracheal tube tip is about 4.7 cm superior to the carina. Subsegmental atelectasis at the left base. Cardiomegaly. No consolidation, pleural effusion or pneumothorax IMPRESSION: 1. Endotracheal tube tip about 4.7 cm superior to the carina. 2.  Cardiomegaly.  Subsegmental atelectasis at the left base. Electronically Signed   By: Donavan Foil M.D.   On: 08/04/2021 15:10   DG Chest Port 1 View  Result Date: 08/04/2021 CLINICAL DATA:  Endotracheal intubation EXAM: PORTABLE CHEST 1 VIEW COMPARISON:  Earlier same day FINDINGS: Endotracheal tube tip 6 cm above the carina. Cardiomegaly persists. Pulmonary venous hypertension, possibly with early interstitial edema. Right lateral chest not included on the image. No evidence of collapse or effusion. IMPRESSION: Endotracheal tube tip 6 cm above the carina. Electronically Signed   By: Nelson Chimes M.D.   On: 08/04/2021 13:42   ECHOCARDIOGRAM COMPLETE  Result Date: 08/04/2021    ECHOCARDIOGRAM REPORT   Patient Name:   HARLIE RAGLE Date of Exam: 08/03/2021 Medical Rec #:  [id number]   Height:       67.0 in Accession #:    [id number]  Weight:       185.0 lb Date of Birth:  01-28-1955   BSA:          1.956 m Patient Age:    27 years    BP:           142/79 mmHg Patient Gender: M           HR:           76 bpm. Exam Location:  [facility] Procedure: 2D Echo, Cardiac Doppler and Color Doppler Indications:     I63.9 Stroke  History:         Patient has no prior history of Echocardiogram examinations.                  Risk Factors:Diabetes, Dyslipidemia, Hypertension and Former                  Smoker.  Sonographer:     Rosalia Hammers Referring Phys:  [id number] Athena Masse Diagnosing Phys: [place]  1. Left ventricular ejection fraction, by estimation, is 60 to 65%. The left ventricle  has normal function. The left ventricle has no regional wall motion abnormalities. There is moderate concentric left ventricular hypertrophy. Left ventricular diastolic parameters are consistent with Grade I diastolic dysfunction (impaired relaxation).  2. Right ventricular systolic function is normal. The right ventricular size is normal.  3. The mitral valve is normal in structure. Trivial mitral valve regurgitation. No  evidence of mitral stenosis.  4. The aortic valve is normal in structure. Aortic valve regurgitation is mild. No aortic stenosis is present.  5. The inferior vena cava is normal in size with greater than 50% respiratory variability, suggesting right atrial pressure of 3 mmHg. FINDINGS  Left Ventricle: Left ventricular ejection fraction, by estimation, is 60 to 65%. The left ventricle has normal function. The left ventricle has no regional wall motion abnormalities. The left ventricular internal cavity size was normal in size. There is  moderate concentric left ventricular hypertrophy. Left ventricular diastolic parameters are consistent with Grade I diastolic dysfunction (impaired relaxation). Right Ventricle: The right ventricular size is normal. No increase in right ventricular wall thickness. Right ventricular systolic function is normal. Left Atrium: Left atrial size was normal in size. Right Atrium: Right atrial size was normal in size. Pericardium: There is no evidence of pericardial effusion. Mitral Valve: The mitral valve is normal in structure. Trivial mitral valve regurgitation. No evidence of mitral valve stenosis. Tricuspid Valve: The tricuspid valve is normal in structure. Tricuspid valve regurgitation is trivial. No evidence of tricuspid stenosis. Aortic Valve: The aortic valve is normal in structure. Aortic valve regurgitation is mild. Aortic regurgitation PHT measures 1577 msec. No aortic stenosis is present. Aortic valve mean gradient measures 5.0 mmHg. Aortic valve peak gradient measures 9.1 mmHg. Aortic valve area, by VTI measures 2.30 cm. Pulmonic Valve: The pulmonic valve was normal in structure. Pulmonic valve regurgitation is not visualized. No evidence of pulmonic stenosis. Aorta: The aortic root is normal in size and structure. Venous: The inferior vena cava is normal in size with greater than 50% respiratory variability, suggesting right atrial pressure of 3 mmHg. IAS/Shunts: No atrial  level shunt detected by color flow Doppler.  LEFT VENTRICLE PLAX 2D LVIDd:         3.84 cm   Diastology LVIDs:         2.17 cm   LV e' medial:    6.39 cm/s LV PW:         1.65 cm   LV E/e' medial:  9.6 LV IVS:        1.18 cm   LV e' lateral:   7.83 cm/s LVOT diam:     1.90 cm   LV E/e' lateral: 7.8 LV SV:         74 LV SV Index:   38 LVOT Area:     2.84 cm  RIGHT VENTRICLE RV Basal diam:  3.15 cm RV S prime:     17.40 cm/s TAPSE (M-mode): 2.4 cm LEFT ATRIUM             Index        RIGHT ATRIUM           Index LA diam:        3.20 cm 1.64 cm/m   RA Area:     18.10 cm LA Vol (A2C):   72.4 ml 37.01 ml/m  RA Volume:   47.10 ml  24.08 ml/m LA Vol (A4C):   65.8 ml 33.63 ml/m LA Biplane Vol: 69.3 ml 35.42 ml/m  AORTIC VALVE AV Area (  Vmax):    2.10 cm AV Area (Vmean):   2.06 cm AV Area (VTI):     2.30 cm AV Vmax:           151.00 cm/s AV Vmean:          100.000 cm/s AV VTI:            0.323 m AV Peak Grad:      9.1 mmHg AV Mean Grad:      5.0 mmHg LVOT Vmax:         112.00 cm/s LVOT Vmean:        72.600 cm/s LVOT VTI:          0.262 m LVOT/AV VTI ratio: 0.81 AI PHT:            1577 msec  AORTA Ao Root diam: 3.20 cm MITRAL VALVE MV Area (PHT): 3.20 cm     SHUNTS MV Decel Time: 237 msec     Systemic VTI:  0.26 m MV E velocity: 61.30 cm/s   Systemic Diam: 1.90 cm MV A velocity: 101.00 cm/s MV E/A ratio:  0.61 Shaukat Khan Electronically signed by Neoma Laming Signature Date/Time: 08/04/2021/8:55:17 AM    Final    MR BRAIN WO CONTRAST  Result Date: 08/03/2021 CLINICAL DATA:  66 year old male with severe intracranial atherosclerosis and questionable medullary ischemia on brain MRI yesterday. EXAM: MRI HEAD WITHOUT CONTRAST TECHNIQUE: Multiplanar, multiecho pulse sequences of the brain and surrounding structures were obtained without intravenous contrast. COMPARISON:  CTA head and neck this morning.  Brain MRI yesterday. FINDINGS: Brain: More convincing appearance of ventral medullary restricted diffusion,  asymmetrically greater to the left of midline. See series 5, image 12, series 7, images 18 and 19. Mild if any associated T2 and FLAIR hyperintensity there. And no other convincing diffusion restriction despite the CTA findings earlier today. 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 Chronic encephalomalacia right inferior frontal gyrus with hemosiderin. Occasional chronic microhemorrhages elsewhere, right cerebellum (series 13, image 19). No new signal abnormality compared to yesterday. No midline shift, mass effect, evidence of mass lesion, ventriculomegaly, extra-axial collection or acute intracranial hemorrhage. Cervicomedullary junction and pituitary are within normal limits. Vascular: Major intracranial vascular flow voids are stable from yesterday. See also CTA today reported separately. Skull and upper cervical spine: Negative. Visualized bone marrow signal is within normal limits. Sinuses/Orbits: Stable, negative. Other: Visible internal auditory structures appear normal. Mastoids are clear. Negative visible scalp and face. IMPRESSION: 1. Positive for ischemia/infarction of the ventral Medulla as suspected yesterday, slightly greater on the left. No associated hemorrhage or mass effect. 2. 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 These results were communicated to Dr. Rory Percy at 11:06 am on 08/03/2021 by text page via the [facility] messaging system. Electronically Signed   By: Genevie Ann M.D.   On: 08/03/2021 11:06   CT ANGIO HEAD NECK W WO CM  Addendum Date: 08/03/2021   ADDENDUM REPORT: 08/03/2021 09:17 ADDENDUM: Study discussed by telephone with Dr. Amie Portland on 08/03/2021 at 0911 hours. Electronically Signed   By: Genevie Ann M.D.   On: 08/03/2021 09:17   Result Date: 08/03/2021 CLINICAL DATA:  66 year old male  with neurologic deficit. Questionable small vessel ischemia of the medulla on brain MRI yesterday. EXAM: CT ANGIOGRAPHY HEAD AND NECK TECHNIQUE: Multidetector CT imaging of the head and neck was  performed using the standard protocol during bolus administration of intravenous contrast. Multiplanar CT image reconstructions and MIPs were obtained to evaluate the vascular anatomy. Carotid stenosis measurements (when applicable) are obtained utilizing NASCET criteria, using the distal internal carotid diameter as the denominator. RADIATION DOSE REDUCTION: This exam was performed according to the departmental dose-optimization program which includes automated exposure control, adjustment of the mA and/or kV according to patient size and/or use of iterative reconstruction technique. CONTRAST:  23m OMNIPAQUE IOHEXOL 350 MG/ML SOLN COMPARISON:  Brain MRI yesterday.  Head CT yesterday. FINDINGS: CT HEAD Brain: Bulky Calcified atherosclerosis at the skull base. Stable CT appearance of the brainstem since yesterday. No acute intracranial hemorrhage identified. No midline shift, mass effect, or evidence of intracranial mass lesion. Chronic right inferior frontal gyrus encephalomalacia, patchy bilateral cerebral white matter hypodensity more pronounced on the left. 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 No ventriculomegaly or acute cortically based infarct. Calvarium and skull base: No acute osseous abnormality identified. Paranasal sinuses: Visualized paranasal sinuses and mastoids are stable and well aerated. Orbits: Visualized orbits and scalp soft tissues are within normal limits. CTA NECK Skeleton: Periapical dental lucency bilaterally. Ordinary cervical spine degeneration. No acute osseous abnormality identified. Upper chest: Centrilobular and paraseptal emphysema. No superior mediastinal lymphadenopathy. Other neck: 15 mm hypodense right thyroid nodule. Recommend thyroid [place](ref: J Am Coll Radiol.  2015 Feb;12(2): 143-50).Dermal and subcutaneous probable 2.6 cm sebaceous cyst of the left submandibular face (series 13, image 79). Otherwise negative CT appearance of the neck soft tissues. Aortic arch: 3 vessel arch configuration. Mild arch atherosclerosis. Right carotid system: Negative brachiocephalic artery and proximal right CCA. Retropharyngeal course of the right carotid bifurcation with mild calcified plaque. Tortuous right ICA distal to the bulb, no stenosis. Left carotid system: Similar mild tortuosity and atherosclerosis with no hemodynamically significant stenosis. Vertebral arteries: Tortuous proximal right subclavian artery with a mildly kinked appearance in the superior mediastinum. Normal right vertebral artery origin. Patent right vertebral artery to the skull base with no significant plaque or stenosis. Mild to moderate soft and calcified plaque in the proximal left subclavian artery with less than 50 % stenosis with respect to the distal vessel. Mild atherosclerosis and stenosis at the left vertebral artery origin on series 13, image 159. Non dominant left vertebral artery than is patent to the skull base with no additional plaque or stenosis. CTA HEAD Posterior circulation: Bilateral V4 segment calcified plaque. Severe stenosis on the left (series 12, image 139) upstream of the left PICA origin which remains patent. But the left V4 segment is occluded distal to the left PICA. Contralateral right V4 moderate tandem stenoses upstream of the patent right PICA origin on series 12, image 130. And severe stenosis, near occlusion of the right vertebrobasilar junction (series 16, image 26). The basilar artery remains patent but is moderately stenotic in the mid basilar segment on series 16, image 24. Distal basilar remains patent along with SCA and PCA origins. Left posterior communicating artery is present, the right is diminutive or absent. a mild to moderate stenosis left PCA origin. And bilateral  P2 segment severe stenosis, near occlusion on series 15, image 53. But there is preserved distal PCA branch enhancement bilaterally. Anterior circulation: Both ICA siphons are patent. However, on the left there is bulky soft plaque or thrombus in the supraclinoid ICA (series 14, image 162) this is between normal appearing left ophthalmic and posterior communicating artery origins. Left carotid terminus remains patent. Contralateral right ICA siphon with  mild to moderate calcified plaque and mild supraclinoid stenosis. Patent carotid termini, MCA and ACA origins. Tortuous A1 segments. Normal anterior communicating artery. Proximal A2 segments are normal. There is severe right ACA distal A2 segment stenosis, near occlusion on series 17, image 32. Left MCA M1 segment is mildly irregular. Left MCA bifurcation is patent without stenosis. Left MCA branches are mildly irregular. Mild to moderate somewhat long segment right MCA M1 stenosis along 6 mm series 16, image 20. Distal M1 and right MCA trifurcation remain patent. Right MCA branches are mildly irregular. Venous sinuses: Early contrast timing, grossly patent. Anatomic variants: Mildly dominant right vertebral artery. Review of the MIP images confirms the above findings IMPRESSION: 1. 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 - Moderate stenosis Mid Basilar Artery. - Near Occlusion bilateral PCA P2 segments, distal right ACA A2. - bulky soft plaque or thrombus in the Supraclinoid Left ICA, with up to Moderate stenosis. - mild-to-moderate Right MCA M1 stenosis. 2. Stable CT appearance of the brain since yesterday. No new intracranial abnormality. 3. Aortic Atherosclerosis (ICD10-I70.0) and Emphysema (ICD10-J43.9). Electronically Signed: By: Genevie Ann M.D. On: 08/03/2021 09:01   US Carotid Bilateral (at [employer] and [facility] only)  Result  Date: 08/03/2021 CLINICAL DATA:  Follow-up examination for stroke. EXAM: BILATERAL CAROTID DUPLEX ULTRASOUND TECHNIQUE: Pearline Cables scale imaging, color Doppler and duplex ultrasound were performed of bilateral carotid and vertebral arteries in the neck. COMPARISON:  Prior Study from 04/03/2014. FINDINGS: Criteria: Quantification of carotid stenosis is based on velocity parameters that correlate the residual internal carotid diameter with NASCET-based stenosis levels, using the diameter of the distal internal carotid lumen as the denominator for stenosis measurement. The following velocity measurements were obtained: RIGHT ICA: 84/10 cm/sec CCA: 96/75 cm/sec SYSTOLIC ICA/CCA RATIO:  1.1 ECA: 64 cm/sec LEFT ICA: 64/16 cm/sec CCA: 916/38 cm/sec SYSTOLIC ICA/CCA RATIO:  0.9 ECA: 117 cm/sec RIGHT CAROTID ARTERY: Mild smooth intimal thickening seen within the visualized right CCA without significant stenosis. Mild heterogeneous mural based echogenic plaque seen about the right carotid bulb. No significant elevation in peak systolic velocity to suggest hemodynamically significant greater than 50% stenosis. Visualized right ICA patent distally without stenosis. RIGHT VERTEBRAL ARTERY:  Patent with antegrade flow. LEFT CAROTID ARTERY: Diffuse smooth intimal thickening seen within the visualized left CCA without significant stenosis. Mild scattered heterogeneous echogenic plaque about the left carotid bulb/proximal left ICA. No significant elevation in peak systolic velocity to suggest hemodynamically significant greater than 50% stenosis. Visualized left ICA patent distally without visible stenosis. LEFT VERTEBRAL ARTERY:  Patent with antegrade flow. IMPRESSION: 1. Mild atherosclerotic plaque within the carotid bulbs and proximal ICAs bilaterally, but no hemodynamically significant greater than 50% stenosis. 2. Patent antegrade flow within both vertebral arteries. Electronically Signed   By: Jeannine Boga M.D.   On:  08/03/2021 03:26   MR Cervical Spine Wo Contrast  Result Date: 08/03/2021 CLINICAL DATA:  Initial evaluation for ataxia, right-sided numbness. EXAM: MRI CERVICAL SPINE WITHOUT CONTRAST TECHNIQUE: Multiplanar, multisequence MR imaging of the cervical spine was performed. No intravenous contrast was administered. COMPARISON:  None available. FINDINGS: Alignment: Examination degraded by motion artifact. Straightening with mild reversal of the normal cervical lordosis. No listhesis. Vertebrae: Vertebral body height maintained without acute or chronic fracture. Bone marrow signal intensity within normal limits. No discrete or worrisome osseous lesions or abnormal marrow edema. Cord: Normal signal and morphology. No convincing cord signal  changes on this motion degraded exam. Posterior Fossa, vertebral arteries, paraspinal tissues: Unremarkable. Disc levels: C2-C3: Unremarkable. C3-C4: Mild disc bulge with endplate and uncovertebral spurring. Flattening and partial effacement of the ventral thecal sac with resultant mild spinal stenosis. Moderate bilateral C4 foraminal narrowing. C4-C5: Mild disc bulge with uncovertebral spurring. Mild flattening of the ventral thecal sac without significant spinal stenosis. Moderate right with mild left C5 foraminal stenosis. C5-C6: Minimal disc bulge with uncovertebral spurring. No spinal stenosis. Mild right C6 foraminal narrowing. Left neural foramen remains patent. C6-C7: Minimal annular disc bulge. No spinal stenosis. Foramina remain patent. C7-T1:  Unremarkable. Visualized upper thoracic spine demonstrates no significant finding. IMPRESSION: 1. Motion degraded exam. 2. No acute abnormality within the cervical spine. 3. Mild multilevel cervical spondylosis with resultant mild spinal stenosis at C3-4. Associated moderate bilateral C4 foraminal stenosis, with mild right C5 and C6 foraminal narrowing. Electronically Signed   By: Jeannine Boga M.D.   On: 08/03/2021 01:04    MR BRAIN WO CONTRAST  Result Date: 08/03/2021 CLINICAL DATA:  Initial evaluation for neuro deficit, stroke suspected. EXAM: MRI HEAD WITHOUT CONTRAST TECHNIQUE: Multiplanar, multiecho pulse sequences of the brain and surrounding structures were obtained without intravenous contrast. COMPARISON:  Prior CT from earlier the same day as well as previous MRI from 09/17/2015. FINDINGS: Brain: Generalized age-related cerebral atrophy. Patchy T2/FLAIR hyperintensity involving the periventricular and deep white matter both cerebral hemispheres as well as the pons, most consistent with chronic small vessel ischemic disease, moderately advanced in nature. Area of encephalomalacia of involving parasagittal anterior/inferior right frontal lobe noted at site of prior hemorrhage. Additional smaller foci of encephalomalacia involving the inferior temporal poles bilaterally likely posttraumatic in nature. Few scattered small remote left cerebellar infarcts noted. Subtle area of diffusion signal abnormality involving the ventral medulla measuring up to approximately 8 mm is seen (a series 5, image 12). While this finding could potentially be artifactual, possibly due to prominent vascular calcifications about the adjacent vertebral arteries, a possible small acute ischemic infarct could be considered. No associated hemorrhage or mass effect. No other evidence for acute or subacute ischemia. No acute intracranial hemorrhage. Single chronic microhemorrhage noted within the right cerebellum, of doubtful significance in isolation. No mass lesion, midline shift or mass effect. No hydrocephalus or extra-axial fluid collection. Pituitary gland and suprasellar region within normal limits. Vascular: Major intracranial vascular flow voids are maintained. Skull and upper cervical spine: Craniocervical junction within normal limits. Bone marrow signal intensity normal. No scalp soft tissue abnormality. Sinuses/Orbits: Globes and orbital  soft tissues within normal limits. Paranasal sinuses are largely clear. No mastoid effusion. Other: None. IMPRESSION: 1. 8 mm focus of diffusion signal abnormality involving the ventral medulla, indeterminate. While this finding could potentially be artifactual in nature, a possible small acute ischemic infarct is difficult to exclude, and could be considered in the correct clinical setting. No associated hemorrhage or mass effect. 2. No other acute intracranial abnormality. 3. Underlying atrophy with moderate chronic small vessel ischemic disease with a few small remote left cerebellar infarcts. Area of encephalomalacia of involving the anterior/inferior right frontal lobe at site of previous hemorrhage. Electronically Signed   By: Jeannine Boga M.D.   On: 08/03/2021 00:59   CT Head Wo Contrast  Result Date: 08/02/2021 CLINICAL DATA:  New right upper extremity tremor. Tingling to the arm. EXAM: CT HEAD WITHOUT CONTRAST TECHNIQUE: Contiguous axial images were obtained from the base of the skull through the vertex without intravenous contrast. RADIATION DOSE REDUCTION: This  exam was performed according to the departmental dose-optimization program which includes automated exposure control, adjustment of the mA and/or kV according to patient size and/or use of iterative reconstruction technique. COMPARISON:  MRI brain 09/17/2015 FINDINGS: Brain: Mild cerebral atrophy. Mild ventricular dilatation consistent with central atrophy. Patchy low-attenuation changes in the deep white matter consistent small vessel ischemic change. Old area of encephalomalacia in the right anterior frontal lobe corresponding to previous intraparenchymal hemorrhage on prior MRI. No acute mass effect or midline shift. No abnormal extra-axial fluid collections. Gray-white matter junctions are distinct. Basal cisterns are not effaced. No acute intracranial hemorrhage. Vascular: No hyperdense vessel or unexpected calcification. Skull:  Normal. Negative for fracture or focal lesion. Sinuses/Orbits: Paranasal sinuses and mastoid air cells are clear. Other: None. IMPRESSION: No acute intracranial abnormalities. Chronic atrophy and small vessel ischemic changes. 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 Electronically Signed   By: Lucienne Capers M.D.   On: 08/02/2021 21:20   DG Foot Complete Right  Result Date: 08/02/2021 CLINICAL DATA:  Chronic RIGHT arm and leg pain for since Sunday, RIGHT leg cold to touch, swelling RIGHT great toe EXAM: RIGHT FOOT COMPLETE - 3+ VIEW COMPARISON:  None Available. FINDINGS: Osseous mineralization normal. 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 Remaining joint spaces preserved. No acute fracture, dislocation, or bone destruction. Small plantar calcaneal spur. IMPRESSION: Degenerative changes first MTP joint and small plantar calcaneal spur. No acute osseous abnormalities. Electronically Signed   By: Lavonia Dana M.D.   On: 08/02/2021 20:51     PHYSICAL EXAM  Temp:  [97 F (36.1 C)-98 F (36.7 C)] 97.6 F (36.4 C) (07/27 2000) Pulse Rate:  [60-87] 64 (07/27 2200) Resp:  [14-33] 18 (07/27 2200) BP: (91-150)/(61-85) 113/70 (07/27 2200) SpO2:  [97 %-100 %] 99 % (07/27 2200) Arterial Line BP: (113-162)/(53-103) 144/64 (07/27 2200) FiO2 (%):  [40 %] 40 % (07/27 1944)  General - Well nourished, well developed, intubated on Precedex.  Ophthalmologic - fundi not visualized due to noncooperation.  Cardiovascular - Regular rate and rhythm.  Neuro - intubated on Precedex, eyes open, following midline commands, eyes in mid position, blinking to visual threat, tracking bilaterally, PERRL. Corneal reflex present, gag and cough present. Breathing over the vent.  Facial symmetry not able to test due to ET tube.  On pain stimulation, no movement in all extremities. No babinski. Sensation, coordination not corporative and gait not tested.   ASSESSMENT/PLAN Mr. Tryton Bodi is a 66 y.o. male with history of remote TBI with ICH, hypertension, hyperlipidemia, diabetes admitted for right leg/foot weakness and pain, right hand numbness, imbalance.  Gradually developed left leg weakness also during admission.  No tPA given due to outside window.    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  Ongoing aggressive stroke risk factor management Therapy recommendations: SNF vs. 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 Tacy Learn, will proceed with early trach and PEG soon.   Diabetes HgbA1c 8.3 goal < 7.0 Controlled CBG monitoring SSI DM education and close PCP follow up  Hypertension unstable BP goal < 180/105 Off Cleviprex Long term BP goal normotensive  Hyperlipidemia Home meds: Lipitor 20 LDL 116, goal < 70 Now on Lipitor 80 Continue statin at discharge  Other Stroke Risk Factors Advanced age Former cigarette smoker quit smoking 33 years ago ETOH use, 1 drink per day  Other Active Problems Remote TBI with traumatic [facility] day # 2  This  patient is critically ill due to brainstem stroke with now quadriplegic, respite failure and at significant risk of neurological worsening, death form 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 This patient's care requires constant monitoring of vital signs, hemodynamics, respiratory and cardiac monitoring, review of multiple databases, neurological assessment, discussion with family, other specialists and medical decision making of high complexity. I spent 35 minutes of neurocritical care time in the care of this patient. I had long discussion with significant other at bedside and daughter over the phone, updated pt current condition, treatment plan and potential prognosis, and answered all the questions. They expressed understanding and appreciation. I also discussed with Dr. Tacy Learn.   Rosalin Hawking, MD PhD Stroke Neurology 08/05/2021 10:49 PM    To contact Stroke Continuity provider, please refer to [email or web address] After hours, contact General Neurology

## 2021-08-05 NOTE — Progress Notes (Signed)
NAME:  Joshua Donovan, MRN:  [id number], DOB:  1955/09/28, LOS: 2 ADMISSION DATE:  08/03/2021, CONSULTATION DATE:  08/04/21 REFERRING MD:  Dr. Gerhard Perches, CHIEF COMPLAINT:  CVA   History of Present Illness:   66 year old male with prior history of HTN, TBI with prior ICH, DM, and HLD who presented [facility] 7/24 with right hand numbness and right LE pain and weakness.  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  He was admitted for further stroke workup.  His weakness worsened to include right arm, right leg and left leg.  CTA angio head revealed multiple intracranial severe stenoses, near- occlusive stenosis of the vertebrobasilar junction, occlusion of the distal left V4, moderate mid basilar stenosis, nearly occlusive bilateral PCA P2 segments, nearly occlusive distal right ACA A2 segments and bulky soft plaque or thrombus in the supraclinoid left ICA with mild to moderate right M1 stenosis.  He was transferred to [facility] for further stroke care and Neuro IR evaluation on 7/25.  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  Additionally noted occlusion of left V4 distal to origin of PICA and patent bilateral P-comm arteries.  Heparin gtt ordered as well as plavix.  Remains on cleviprex gtt for strict SBP parameters.  Patient left intubated but weaning in PACU while awaiting ICU bed.  PCCM consulted for further ventilator management.    Pertinent  Medical History  TBI with ICH, DM, HTN, HLD  Significant Hospital Events: Including procedures, antibiotic start and stop dates in addition to other pertinent events   7/24 presented to [facility], ventral medulla CVA 7/25 tx to [facility] 7/26 cerebral angiogram with stent placement to right vertebro-basilar junction, failed extubation, left radial aline   7/26 MRI brain>  1. Interval expansion of previously identified ventral medullary infarct, now  extending posteriorly to traverse the medulla to the floor of the fourth ventricle. Additionally, there are new scattered small volume ischemic infarcts involving the right cerebellum as well as the cortical aspects of the right greater than left occipital lobes. No associated mass effect. Single punctate focus of associated petechial hemorrhage at the right cerebellum as above.  2. Otherwise stable appearance of the brain with atrophy, chronic microvascular ischemic disease, and multiple chronic ischemic infarcts as above. MRA HEAD IMPRESSION: 1. Interval placement of a vascular stent extending from the distal right V4 segment across the vertebrobasilar junction into the proximal basilar artery. Grossly patent flow through the stent, although evaluation for possible intra stent stenosis is limited by MRA. Stable and fairly robust flow seen within the basilar artery distally. 2. Otherwise stable appearance of the intracranial circulation with moderate multifocal stenoses involving the proximal left V4 segment with distal left V4 occlusion, severe right A2/A3 stenosis, severe bilateral P2 stenoses, moderate supraclinoid left ICA stenosis, with mild to moderate long segment right M1 stenosis.   Interim History / Subjective:  No events overnight  Objective   Blood pressure 138/73, pulse 70, temperature (!) 97 F (36.1 C), temperature source Axillary, resp. rate (!) 33, height '5\' 7"'$  (1.702 m), weight 83.9 kg, SpO2 98 %.    Vent Mode: PRVC FiO2 (%):  [40 %-60 %] 40 % Set Rate:  [16 bmp-18 bmp] 18 bmp Vt Set:  [520 mL] 520 mL PEEP:  [5 cmH20] 5 cmH20 Pressure Support:  [10 cmH20] 10 cmH20 Plateau Pressure:  [13 cmH20-21 cmH20] 13 cmH20   Intake/Output Summary (Last 24  hours) at 08/05/2021 3354 Last data filed at 08/05/2021 0600 Gross per 24 hour  Intake 1545.6 ml  Output 1105 ml  Net 440.6 ml   Filed Weights   08/04/21 0914  Weight: 83.9 kg   Examination: Fentanyl 100 General:  Older adult  male resting in bed in NAD, intubated on MV HEENT: MM pink/moist, ETT/ OGT, pupils 3/reactive Neuro: eyes open, able to track horizontally, follow commands> some mild protrusion of tongue, blinks to command> blinks yes to pain, but remains flaccid in all extremities to noxious stimuli CV: rr, NSR 70, no murmur PULM:  non labored, clear, delayed cough to suctioning, no secretions  GI: soft, bs+, NT/ ND, foley  Extremities: warm/dry, no LE edema  Skin: no rashes   Afebrile Labs > K 3.3, glucose 222, sCr 0.88, WBC 12.5 UOP 1.3L/ 24hrs   Resolved Hospital Problem list    Assessment & Plan:   Acute respiratory insufficiency related to below - continue full MV support, PRVC, with daily SBT but hold extubation as patient unable to protect airway.  Would benefit from early trach pending GOC  - VAP prevention protocol/ PPI - PAD protocol for sedation> fentanyl gtt/ precedex, RASS goal 0/-1 - wean FiO2 as able for SpO2 >92%  - intermittent CXR  Brainstem stroke involving the ventral medulla with basilar artery stensois s/p stent placement to right vertebro-basilar junction  Severe intracranial atherosclerosis TTE EF 60-65%, no WMA, G1DD, normal RV/ valves - per neuro and neuro IR - SBP goal 120-140 for 24hrs post IR.  Weaning Cleviprex  - continue brilinta/ ASA - MRI/ MRA as above   HTN emergency - hold home lisinopril '40mg'$  and lopressor '25mg'$  daily - strict SBP goals as above  HLD - continue high dose statin  DM A1c 8.3 - higher over night, adding coverage and levemir  ?hx of ETOH abuse - monitor for withdrawals - empiric thiamine/ folate/ MVI  Best Practice (right click and "Reselect all SmartList Selections" daily)   Diet/type: NPO;  TF per RD recs DVT prophylaxis: prophylactic heparin  GI prophylaxis: PPI Lines: N/A Foley:  Yes, and it is still needed Code Status:  full code Last date of multidisciplinary goals of care discussion [per primary]  Oldest daughter,  Renard Matter updated at bedside by Dr. Tacy Learn.   Labs   CBC: Recent Labs  Lab 08/02/21 2027 08/05/21 0531  WBC 6.4 12.5*  NEUTROABS 4.3  --   HGB 14.2 13.6  HCT 43.0 40.3  MCV 85.5 87.4  PLT 296 562    Basic Metabolic Panel: Recent Labs  Lab 08/02/21 2027 08/03/21 0347 08/05/21 0531  NA 136 136 140  K 3.1* 3.4* 3.3*  CL 101 101 107  CO2 '25 24 25  '$ GLUCOSE 182* 212* 222*  BUN '14 11 14  '$ CREATININE 0.70 0.76 0.88  CALCIUM 9.9 9.0 7.9*  MG  --  1.6*  --    GFR: Estimated Creatinine Clearance: 85.5 mL/min (by C-G formula based on SCr of 0.88 mg/dL). Recent Labs  Lab 08/02/21 2027 08/05/21 0531  WBC 6.4 12.5*    Liver Function Tests: Recent Labs  Lab 08/02/21 2027  AST 26  ALT 28  ALKPHOS 29*  BILITOT 0.5  PROT 7.3  ALBUMIN 4.2   No results for input(s): "LIPASE", "AMYLASE" in the last 168 hours. No results for input(s): "AMMONIA" in the last 168 hours.  ABG    Component Value Date/Time   PHART 7.34 (L) 08/04/2021 1524   PCO2ART 37 08/04/2021  1524   PO2ART 116 (H) 08/04/2021 1524   HCO3 20.0 08/04/2021 1524   ACIDBASEDEF 5.2 (H) 08/04/2021 1524   O2SAT 98.9 08/04/2021 1524     Coagulation Profile: No results for input(s): "INR", "PROTIME" in the last 168 hours.  Cardiac Enzymes: No results for input(s): "CKTOTAL", "CKMB", "CKMBINDEX", "TROPONINI" in the last 168 hours.  HbA1C: Hemoglobin A1C  Date/Time Value Ref Range Status  02/20/2020 04:24 PM 10.8 (A) 4.0 - 5.6 % Final  01/22/2019 12:13 PM 7.0 (A) 4.0 - 5.6 % Final   Hgb A1c MFr Bld  Date/Time Value Ref Range Status  08/03/2021 03:47 AM 8.3 (H) 4.8 - 5.6 % Final    Comment:    (NOTE) Pre diabetes:          5.7%-6.4%  Diabetes:              >6.4%  Glycemic control for   <7.0% adults with diabetes   07/19/2015 07:29 PM 7.0 (H) 4.0 - 6.0 % Final    CBG: Recent Labs  Lab 08/04/21 1319 08/04/21 1744 08/04/21 1937 08/04/21 2333 08/05/21 0322  GLUCAP 218* 383* 372* 292* 244*     Critical care time: 35 mins     Kennieth Rad, ACNP [facility] 08/05/2021, 8:14 AM  See Amion for pager If no response to pager, please call PCCM consult pager After 7:00 pm call Elink

## 2021-08-05 NOTE — Evaluation (Addendum)
Physical Therapy Re-Evaluation Patient Details Name: Joshua Donovan MRN: [id number] DOB: Sep 22, 1955 Today's Date: 08/05/2021  History of Present Illness  Pt is a 66 y.o. male who presented 08/02/21 with R foot pain and R-sided weakness. Transferred to [facility] 7/25. 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 S/p stent placement to the R vertebrobasilar junction 7/26, failed extubation. PMH: DM, GERD, TBI with prior ICH, HLD, HTN   Clinical Impression  Per PT Eval 08/03/21 prior to transfer to [facility], pt was IND and living alone in a 1-level house with 2 STE PTA. Currently, pt is awake and able to answer yes/no questions via blinking accurately and consistently. He was able to acknowledge touch to his limbs, but indicated the sensation was abnormal. Pt was flaccid throughout his neck, trunk, and all 4 limbs with no muscle activation noted throughout session. He was able to tolerate sitting EOB with TA x2 for up to ~10 min with no apparent reaction to the tube/vent. Pt was able to follow cues for testing his vision and was able to detect objects in his peripheral, but noted nystagmus with tracking in all directions (L>R). At this time, recommending SNF vs LTACH. Will continue to follow acutely.       Recommendations for follow up therapy are one component of a multi-disciplinary discharge planning process, led by the attending physician.  Recommendations may be updated based on patient status, additional functional criteria and insurance authorization.  Follow Up Recommendations Skilled nursing-short term rehab (<3 hours/day) (vs LTACH) Can patient  physically be transported by private vehicle: No    Assistance Recommended at Discharge Frequent or constant Supervision/Assistance  Patient can return home with the following  Assistance with cooking/housework;Two people to help with walking and/or transfers;Two people to help with bathing/dressing/bathroom;Assistance with feeding;Direct supervision/assist for medications management;Direct supervision/assist for financial management;Assist for transportation;Help with stairs or ramp for entrance    Equipment Recommendations Other (comment) (TBD)  Recommendations for Other Services       Functional Status Assessment Patient has had a recent decline in their functional status and demonstrates the ability to make significant improvements in function in a reasonable and predictable amount of time.     Precautions / Restrictions Precautions Precautions: Fall;Other (comment) (locked in) Precaution Comments: A-line; ETT; SBP < 180 Restrictions Weight Bearing Restrictions: No Other Position/Activity Restrictions: splint on LUE to manage lines      Mobility  Bed Mobility Overal bed mobility: Needs Assistance Bed Mobility: Supine to Sit, Sit to Supine     Supine to sit: Total assist, +2 for physical assistance, +2 for safety/equipment, HOB elevated Sit to supine: Total assist, +2 for physical assistance, +2 for safety/equipment, HOB elevated   General bed mobility comments: No muscle activation noted, needing TA x2 for all bed mobility.    Transfers                   General transfer comment: deferred due to pt flaccidity    Ambulation/Gait               General Gait Details: deferred due to pt flaccidity  Stairs  Wheelchair Mobility    Modified Rankin (Stroke Patients Only) Modified Rankin (Stroke Patients Only) Pre-Morbid Rankin Score: No symptoms Modified Rankin: Severe disability     Balance Overall balance assessment: Needs  assistance Sitting-balance support: Single extremity supported, No upper extremity supported, Feet supported Sitting balance-Leahy Scale: Zero Sitting balance - Comments: TA x2 to sit EOB for up to ~10 min, no muscle activation noted by pt to sit up or raise head       Standing balance comment: deferred due to pt flaccidity                             Pertinent Vitals/Pain Pain Assessment Pain Assessment: Faces Faces Pain Scale: No hurt Pain Intervention(s): Monitored during session    Home Living Family/patient expects to be discharged to:: Skilled nursing facility Living Arrangements: Alone Available Help at Discharge: Friend(s);Available 24 hours/day;Neighbor Type of Home: House Home Access: Stairs to enter Entrance Stairs-Rails: None Entrance Stairs-Number of Steps: 2   Home Layout: One level Home Equipment: Other (comment) Additional Comments: info carried over from prior PT eval on 08/03/21    Prior Function Prior Level of Function : Independent/Modified Independent             Mobility Comments: info carried over from prior PT eval on 08/03/21 ADLs Comments: independent PTA     Hand Dominance   Dominant Hand: Right    Extremity/Trunk Assessment   Upper Extremity Assessment Upper Extremity Assessment: RUE deficits/detail;LUE deficits/detail RUE Deficits / Details: No AROM noted.  PROM intact. RUE Sensation:  (feels therapist touching but nods that it is not normal) RUE Coordination: decreased gross motor;decreased fine motor LUE Deficits / Details: No AROM noted.  PROM difficult to assess due to multiple lines LUE: Unable to fully assess due to immobilization LUE Sensation:  (states he feels light touch but it is not normal) LUE Coordination: decreased fine motor;decreased gross motor    Lower Extremity Assessment Lower Extremity Assessment: Defer to PT evaluation RLE Deficits / Details: Flaccid with no muscle activation noted throughout;  indicated abnormal sensations in leg but able to detect touch RLE Sensation: decreased light touch LLE Deficits / Details: Flaccid with no muscle activation noted throughout; indicated abnormal sensations in leg but able to detect touch LLE Sensation: decreased light touch    Cervical / Trunk Assessment Cervical / Trunk Assessment: Other exceptions Cervical / Trunk Exceptions: cervical and trunk flaccidity with no muscle activation noted, tends to rest with head turned to L towards vent.  Head positioned at mildline with towel roll at end of session  Communication   Communication: Other (comment) (intubated. Blinks eyes once for yes and twice for no and is accurate.)  Cognition Arousal/Alertness: Awake/alert Behavior During Therapy: Flat affect (crying at one point) Overall Cognitive Status: Difficult to assess                                 General Comments: Pt intubated, thus difficult to formally assess cognition. However, pt answering yes/no questions accurately and consistently by blinking eyes 1x for yes and 2x for no. Able to follow cues for eye movements, but flaccid throughout body and thus unable to follow other motor cues.        General Comments General comments (skin integrity, edema, etc.): Pt total assist with all mobility and adls and on vent    Exercises  Assessment/Plan    PT Assessment Patient needs continued PT services  PT Problem List Decreased strength;Decreased range of motion;Decreased activity tolerance;Decreased balance;Decreased mobility;Decreased coordination;Decreased cognition;Decreased knowledge of use of DME;Decreased safety awareness;Cardiopulmonary status limiting activity;Impaired sensation;Impaired tone       PT Treatment Interventions DME instruction;Gait training;Stair training;Functional mobility training;Therapeutic activities;Therapeutic exercise;Balance training;Neuromuscular re-education;Patient/family education;Cognitive  remediation;Wheelchair mobility training    PT Goals (Current goals can be found in the Care Plan section)  Acute Rehab PT Goals Patient Stated Goal: to improve per friend PT Goal Formulation: With family Time For Goal Achievement: 08/19/21 Potential to Achieve Goals: Fair Additional Goals Additional Goal #1: Pt will be able to demonstrate any muscle activation in either of his legs 3/5 x.    Frequency Min 3X/week     Co-evaluation PT/OT/SLP Co-Evaluation/Treatment: Yes Reason for Co-Treatment: Complexity of the patient's impairments (multi-system involvement);Necessary to address cognition/behavior during functional activity PT goals addressed during session: Mobility/safety with mobility;Balance OT goals addressed during session: ADL's and self-care       AM-PAC PT "6 Clicks" Mobility  Outcome Measure Help needed turning from your back to your side while in a flat bed without using bedrails?: Total Help needed moving from lying on your back to sitting on the side of a flat bed without using bedrails?: Total Help needed moving to and from a bed to a chair (including a wheelchair)?: Total Help needed standing up from a chair using your arms (e.g., wheelchair or bedside chair)?: Total Help needed to walk in hospital room?: Total Help needed climbing 3-5 steps with a railing? : Total 6 Click Score: 6    End of Session Equipment Utilized During Treatment: Oxygen Activity Tolerance: Patient tolerated treatment well Patient left: in bed;with call bell/phone within reach;with bed alarm set;with family/visitor present Nurse Communication: Mobility status PT Visit Diagnosis: Muscle weakness (generalized) (M62.81);Difficulty in walking, not elsewhere classified (R26.2);Other symptoms and signs involving the nervous system (R29.898);Unsteadiness on feet (R26.81)    Time: 3568-6168 PT Time Calculation (min) (ACUTE ONLY): 28 min   Charges:   PT Evaluation $PT Re-evaluation: 1  Re-eval          Moishe Spice, PT, DPT Acute Rehabilitation Services  Office: [phone]   Orvan Falconer 08/05/2021, 12:46 PM

## 2021-08-05 NOTE — Progress Notes (Signed)
Initial Nutrition Assessment  DOCUMENTATION CODES:   Not applicable  INTERVENTION:   Tube Feeding via PEG: Osmolite 1.5 at 55 ml/hr Pro-Source TF 45 mL BID This provides 2060 kcals, 111 g of protein and 1094 mL of free water  Additional calories from fat via Cleviprex  NUTRITION DIAGNOSIS:   Inadequate oral intake related to acute illness as evidenced by NPO status.  GOAL:   Patient will meet greater than or equal to 90% of their needs  MONITOR:   Vent status, Labs, Weight trends, TF tolerance  REASON FOR ASSESSMENT:   Consult Enteral/tube feeding initiation and management  ASSESSMENT:   65 yo male admitted post brain stem stroke involving ventral medulla requiring stent placement to right vertebro-basilar junction. PMH includes HTN, TBI, DM, HLD  7/24 Admitted to [facility], ventral medulla CVA 7/25 Tx to [facility] 7/26 Cerebral angiogram with stent placement to right vertebro-basilar junction, failed extubation, reintubated  Pt remains on vent support, awake on visit today. Pt failed extubation. Pt with medial medullary syndrome with quadriplegia/quadriparesis  Cleviprex gtt currently  OG tube with tip in stomach per and xray  Per RN, plan is for trach and PEG soon  Unable to obtain diet and weight history   Labs: CBGs 203-383 (ICU goal 140-180), potassium 3.3  Meds: NS at 40 ml/hr, colace, cleviprex, folic acid, ss novolog, folic acid, levemir, liquid MVI, mag sulfate, Thiamine injection, miralax  NUTRITION - FOCUSED PHYSICAL EXAM:  Flowsheet Row Most Recent Value  Orbital Region No depletion  Upper Arm Region No depletion  Thoracic and Lumbar Region No depletion  Buccal Region Unable to assess  Temple Region No depletion  Clavicle Bone Region No depletion  Clavicle and Acromion Bone Region No depletion  Scapular Bone Region No depletion  Dorsal Hand No depletion  Patellar Region No depletion  Anterior Thigh Region No depletion  Posterior Calf Region Unable  to assess  Edema (RD Assessment) Mild       Diet Order:   Diet Order             Diet NPO time specified  Diet effective now                   EDUCATION NEEDS:   Not appropriate for education at this time  Skin:  Skin Assessment: Reviewed RN Assessment  Last BM:  PTA  Height:   Ht Readings from Last 1 Encounters:  08/04/21 '5\' 7"'$  (1.702 m)    Weight:   Wt Readings from Last 1 Encounters:  08/04/21 83.9 kg     BMI:  Body mass index is 28.97 kg/m.  Estimated Nutritional Needs:   Kcal:  2000-2200 kcals  Protein:  100-115 g  Fluid:  >/= 2 L  Kerman Passey MS, RDN, LDN, CNSC Registered Dietitian 3 Clinical Nutrition RD Pager and On-Call Pager Number Located in [place]

## 2021-08-05 NOTE — Evaluation (Signed)
Occupational Therapy Evaluation Patient Details Name: Joshua Donovan MRN: [id number] DOB: 07-11-1955 Today's Date: 08/05/2021   History of Present Illness Pt is a 66 y.o. male who presented 08/02/21 with R foot pain and R-sided weakness. Transferred to [facility] 7/25. 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 S/p stent placement to the R vertebrobasilar junction 7/26, failed extubation. PMH: DM, GERD, TBI with prior ICH, HLD, HTN   Clinical Impression   Pt admitted with the above diagnosis and has the deficits listed below. Pt would benefit from cont OT on a trial basis to educate the family on proper positioning for skin care and ROM exercises to keep UEs from contraction while pt remains essentially locked in. Pt accurate with Yes no questions by blinking once to yes and twice for no.  Will explore other options for pt to be able to access a nurse with a call button of some sort.  Feel this pt will need some sort of long term care if pt remains locked in and on vent.  If pt able to wean off vent, there will be options for breath controlled assistive devices if pt remains unable to move on his own.  Will continue to see pt to educate family and will continue to reassess and add goals as pt progresses.        Recommendations for follow up therapy are one component of a multi-disciplinary discharge planning process, led by the attending physician.  Recommendations may be updated based on patient status, additional functional criteria and insurance authorization.   Follow Up Recommendations  OT at Long-term acute care hospital    Assistance  Recommended at Discharge Frequent or constant Supervision/Assistance  Patient can return home with the following Two people to help with walking and/or transfers;Two people to help with bathing/dressing/bathroom;Assistance with cooking/housework;Assistance with feeding;Direct supervision/assist for medications management;Direct supervision/assist for financial management;Assist for transportation;Help with stairs or ramp for entrance    Functional Status Assessment  Patient has had a recent decline in their functional status and demonstrates the ability to make significant improvements in function in a reasonable and predictable amount of time.  Equipment Recommendations  Other (comment) (tbd)    Recommendations for Other Services       Precautions / Restrictions Precautions Precautions: Fall;Other (comment) (locked in) Precaution Comments: A-line; ETT; SBP < 180 Restrictions Weight Bearing Restrictions: No Other Position/Activity Restrictions: splint on LUE to manage lines      Mobility Bed Mobility Overal bed mobility: Needs Assistance Bed Mobility: Supine to Sit, Sit to Supine     Supine to sit: Total assist, +2 for physical assistance, +2 for safety/equipment, HOB elevated Sit to supine: Total assist, +2 for physical assistance, +2 for safety/equipment, HOB elevated   General bed mobility comments: No muscle activation noted, needing TA x2 for all bed mobility.    Transfers                   General transfer comment: deferred due to pt flaccidity      Balance Overall balance assessment: Needs assistance Sitting-balance support: Single extremity supported, No upper extremity supported, Feet supported Sitting balance-Leahy Scale: Zero Sitting balance - Comments: TA x2  to sit EOB for up to ~10 min, no muscle activation noted by pt to sit up or raise head       Standing balance comment: deferred due to pt flaccidity                           ADL  either performed or assessed with clinical judgement   ADL Overall ADL's : Needs assistance/impaired Eating/Feeding: NPO   Grooming: Total assistance   Upper Body Bathing: Total assistance   Lower Body Bathing: Total assistance   Upper Body Dressing : Total assistance   Lower Body Dressing: Total assistance   Toilet Transfer: Total assistance   Toileting- Clothing Manipulation and Hygiene: Total assistance;+2 for physical assistance       Functional mobility during ADLs: Total assistance;+2 for physical assistance General ADL Comments: Pt completely locked in with no active movement throughout.     Vision Baseline Vision/History: 0 No visual deficits Ability to See in Adequate Light: 0 Adequate Patient Visual Report: Other (comment) (states he does not see blurry or double) Vision Assessment?: Yes Eye Alignment: Within Functional Limits Ocular Range of Motion: Within Functional Limits Alignment/Gaze Preference: Gaze left Tracking/Visual Pursuits: Decreased smoothness of horizontal tracking;Decreased smoothness of eye movement to LEFT inferior field;Decreased smoothness of eye movement to RIGHT inferior field;Decreased smoothness of eye movement to LEFT superior field;Decreased smoothness of eye movement to RIGHT superior field;Impaired - to be further tested in functional context;Other (comment) (Nystagmus noted tracking to R and L but greater to the L.) Visual Fields: No apparent deficits     Perception     Praxis      Pertinent Vitals/Pain Pain Assessment Pain Assessment: No/denies pain Faces Pain Scale: No hurt     Hand Dominance Right   Extremity/Trunk Assessment Upper Extremity Assessment Upper Extremity Assessment: RUE deficits/detail;LUE deficits/detail RUE Deficits / Details: No AROM noted.  PROM intact. RUE Sensation:  (feels therapist touching but nods that it is not normal) RUE Coordination: decreased gross motor;decreased fine motor LUE Deficits /  Details: No AROM noted.  PROM difficult to assess due to multiple lines LUE: Unable to fully assess due to immobilization LUE Sensation:  (states he feels light touch but it is not normal) LUE Coordination: decreased fine motor;decreased gross motor   Lower Extremity Assessment Lower Extremity Assessment: Defer to PT evaluation RLE Deficits / Details: Flaccid with no muscle activation noted throughout; indicated abnormal sensations in leg but able to detect touch RLE Sensation: decreased light touch LLE Deficits / Details: Flaccid with no muscle activation noted throughout; indicated abnormal sensations in leg but able to detect touch LLE Sensation: decreased light touch   Cervical / Trunk Assessment Cervical / Trunk Assessment: Other exceptions Cervical / Trunk Exceptions: cervical and trunk flaccidity with no muscle activation noted, tends to rest with head turned to L towards vent.  Head positioned at mildline with towel roll at end of session   Communication Communication Communication: Other (comment) (intubated. Blinks eyes once for yes and twice for no and is accurate.)   Cognition Arousal/Alertness: Awake/alert Behavior During Therapy: Flat affect Overall Cognitive Status: Difficult to assess Area of Impairment:  (Pt appears to be locked in.  Difficult to fully assess cognition. Pt able to tell time, answered yes/no questions accurately.)                               General  Comments: Pt intubated, thus difficult to formally assess cognition. However, pt answering yes/no questions accurately and consistently by blinking eyes 1x for yes and 2x for no. Able to follow cues for eye movements, but flaccid throughout body and thus unable to follow other motor cues.     General Comments  Pt total assist with all mobility and adls and on vent    Exercises     Shoulder Instructions      Home Living Family/patient expects to be discharged to:: Skilled nursing  facility Living Arrangements: Alone Available Help at Discharge: Friend(s);Available 24 hours/day;Neighbor Type of Home: House Home Access: Stairs to enter [employer] of Steps: 2 Entrance Stairs-Rails: None Home Layout: One level     Bathroom Shower/Tub: Teacher, early years/pre: Standard     Home Equipment: Other (comment)   Additional Comments: info carried over from prior PT eval on 08/03/21      Prior Functioning/Environment Prior Level of Function : Independent/Modified Independent             Mobility Comments: info carried over from prior PT eval on 08/03/21 ADLs Comments: independent PTA        OT Problem List: Decreased strength;Decreased range of motion;Decreased activity tolerance;Impaired balance (sitting and/or standing);Impaired vision/perception;Decreased coordination;Decreased knowledge of use of DME or AE;Decreased knowledge of precautions;Cardiopulmonary status limiting activity;Impaired sensation;Impaired tone;Impaired UE functional use;Pain;Increased edema      OT Treatment/Interventions: Therapeutic exercise;Self-care/ADL training;Neuromuscular education;DME and/or AE instruction;Visual/perceptual remediation/compensation;Patient/family education    OT Goals(Current goals can be found in the care plan section) Acute Rehab OT Goals Patient Stated Goal: unable OT Goal Formulation: With patient/family Time For Goal Achievement: 08/19/21 Potential to Achieve Goals: Fair ADL Goals Pt/caregiver will Perform Home Exercise Program: Both right and left upper extremity;With written HEP provided Additional ADL Goal #1: Pt's family will be educated about bed positioning to avoid pressure sores and increase care of skin Additional ADL Goal #2: Pt will use soft touch call bell to call nurses by moving head with min assist. Additional ADL Goal #3: Therapist to explore other options for call bell use if pt unable to move head to use soft touch  system.  OT Frequency: Min 2X/week    Co-evaluation PT/OT/SLP Co-Evaluation/Treatment: Yes Reason for Co-Treatment: Complexity of the patient's impairments (multi-system involvement);Necessary to address cognition/behavior during functional activity PT goals addressed during session: Mobility/safety with mobility;Balance OT goals addressed during session: ADL's and self-care      AM-PAC OT "6 Clicks" Daily Activity     Outcome Measure Help from another person eating meals?: Total Help from another person taking care of personal grooming?: Total Help from another person toileting, which includes using toliet, bedpan, or urinal?: Total Help from another person bathing (including washing, rinsing, drying)?: Total Help from another person to put on and taking off regular upper body clothing?: Total Help from another person to put on and taking off regular lower body clothing?: Total 6 Click Score: 6   End of Session Nurse Communication: Mobility status  Activity Tolerance: Patient limited by fatigue Patient left: in bed;with call bell/phone within reach;with family/visitor present  OT Visit Diagnosis: Other symptoms and signs involving the nervous system (I09.735)                Time: 3299-2426 OT Time Calculation (min): 29 min Charges:  OT General Charges $OT Visit: 1 Visit OT Evaluation $OT Eval Moderate Complexity: 1 Mod  Glenford Peers 08/05/2021, 12:01 PM

## 2021-08-05 NOTE — TOC CAGE-AID Note (Signed)
Transition of Care [facility]) - CAGE-AID Screening   Patient Details  Name: Joshua Donovan MRN: [id number] Date of Birth: 1955/10/27  Transition of [facility]) CM/SW Contact:    Coralee Pesa, [place] Phone Number: 08/05/2021, 9:38 AM   Clinical Narrative:  Per chart review, pt is intubated and sedated, unable to participate in assessment at this time.  CAGE-AID Screening: Substance Abuse Screening unable to be completed due to: : Patient unable to participate (Intubated/ sedated)

## 2021-08-05 NOTE — IPAL (Signed)
Interdisciplinary Goals of Care Family Meeting   Date carried out:: 08/05/2021  Location of the meeting: Phone conference  Member's involved: Physician, Bedside Registered Nurse, and Family Member or next of kin  Durable Power of Attorney or acting medical decision maker: [name]    Discussion: We discussed goals of care for Joshua Donovan .    The Clinical status was relayed to daughter over the phone in detail.   Updated and notified of patients medical condition.     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  After discussion with siblings patient's family decided to proceed with tracheostomy and PEG tube placement  Code status: Full Code  Disposition: Continue current acute care    Family are satisfied with Plan of action and management. All questions answered   Jacky Kindle MD [facility] See Amion for pager If no response to pager, please call [phone] until 7pm After 7pm, Please call E-link [phone]

## 2021-08-05 NOTE — Progress Notes (Signed)
SLP Cancellation Note  Patient Details Name: Joshua Donovan MRN: [id number] DOB: [birth year]   Cancelled treatment:       Reason Eval/Treat Not Completed: Patient not medically ready (Pt currently intubated. SLP will follow up.)  Josephmichael Lisenbee I. Hardin Negus, [place], [place] Office number [phone] Pager [place] 08/05/2021, 8:12 AM

## 2021-08-05 NOTE — Progress Notes (Signed)
Referring Physician(s): Dr. Erlinda Hong Dr. Cheral Marker  Supervising Physician: Frazier Richards   Patient Status:  [facility] - In-pt  Chief Complaint:  S/p stent placement of R vertebrobasilar junction by Dr. Gerhard Perches on 08/04/21. Patient failed extubation, currently remains intubated   Subjective:  Patient seen with Dr. Gerhard Perches and Dr. Estanislado Pandy.  Laying in bed, intubated and sedated with Fentanyl.  Family members and RN at bedside. RN reports patient occasionally breath on his own but not often. No plan for extubation today.  Family member states that patient has been responding to verbal stimuli, but no movement in ext seen.   Allergies: Lisinopril, Ace inhibitors, Anchovies [fish allergy], and Sulfa antibiotics  Medications: Prior to Admission medications   Medication Sig Start Date End Date Taking? Authorizing Provider  ACCU-CHEK FASTCLIX LANCETS MISC Use as directed twice daily dia E11.65 08/03/17   Lavera Guise, MD  atorvastatin (LIPITOR) 20 MG tablet Take 20 mg by mouth daily. 08/02/21   [provider]  atorvastatin (LIPITOR) 80 MG tablet Take 1 tablet (80 mg total) by mouth daily. 08/04/21   Wouk, Ailene Rud, MD  Blood Glucose Monitoring Suppl (ACCU-CHEK AVIVA PLUS) w/Device KIT Use as directed 10/04/17   Ronnell Freshwater, NP  cetirizine (ZYRTEC) 10 MG tablet Take 1 tablet (10 mg total) by mouth daily. 02/20/20   Lavera Guise, MD  glucose blood (ACCU-CHEK AVIVA PLUS) test strip Use two times daily to check blood sugar 10/05/17   Ronnell Freshwater, NP  lisinopril (ZESTRIL) 20 MG tablet Take 20 mg by mouth daily. 06/05/21   [provider]  lisinopril (ZESTRIL) 40 MG tablet Take 40 mg by mouth daily.    [provider]  metFORMIN (GLUCOPHAGE) 1000 MG tablet Take 1,000 mg by mouth 2 (two) times daily with a meal.    [provider]  metoprolol tartrate (LOPRESSOR) 25 MG tablet Take 1 tablet (25 mg total) by mouth daily. Patient not taking: Reported on  08/03/2021 02/20/20   Lavera Guise, MD     Vital Signs: BP 113/63   Pulse 64   Temp (!) 97.5 F (36.4 C) (Oral)   Resp 18   Ht 5' 7"  (1.702 m)   Wt 185 lb (83.9 kg)   SpO2 98%   BMI 28.97 kg/m   Physical Exam Vitals reviewed.  Constitutional:      General: He is not in acute distress.    Appearance: He is ill-appearing.     Comments: Intubated and sedated   HENT:     Head: Normocephalic.  Cardiovascular:     Comments: DP 1 + on right, 2+ on left  Skin:    General: Skin is warm and dry.     Coloration: Skin is not jaundiced or pale.     Comments: Positive dressing on R groin  puncture site. Site is unremarkable with no erythema, edema, tenderness, bleeding or drainage. Minimal amount of old, dry blood noted on the dressing. Dressing otherwise clean, dry, and intact.    Neurological:     Comments: Sedated with Fentanyl, opens eyes for verbal stimuli No movement seen in extremities yet       Imaging: MR BRAIN WO CONTRAST  Result Date: 08/05/2021 CLINICAL DATA:  66 year old male with history of known medullary infarct and severe vertebrobasilar disease, status post catheter directed revascularization and stenting. EXAM: MRI HEAD WITHOUT CONTRAST MRA HEAD WITHOUT CONTRAST TECHNIQUE: Multiplanar, multi-echo pulse sequences of the brain and surrounding structures were acquired without  intravenous contrast. Angiographic images of the Circle of Willis were acquired using MRA technique without intravenous contrast. COMPARISON:  Comparison made with arteriogram from earlier the same day as well as multiple previous studies. FINDINGS: MRI HEAD FINDINGS Brain: There has been interval expansion of previously identified ventral medullary infarct, now extending posteriorly to traversed the entirety of the medulla to the floor of the fourth ventricle (series 5, images 63, 64, 65), slightly worse on the left. Additionally, there are new scattered patchy ischemic infarcts involving the right  cerebellum. Few additional patchy small volume ischemic infarcts noted involving the cortical aspects of the right greater than left occipital lobes (series 5, images 79, 77, 75, 73, 72 no significant associated mass effect. Chronic right cerebellar microhemorrhage noted. There is a new small focus of susceptibility artifact within the adjacent right cerebellum, likely a punctate focus of petechial blood products (series 14, image 20). No other associated blood products. Remainder the brain is otherwise stable in appearance. Stable cerebral volume with chronic small vessel ischemic disease. No new focal parenchymal signal abnormality. Pituitary gland and suprasellar region remain within normal limits. Midline encephalomalacia with chronic hemosiderin staining at the anterior right frontal lobe again noted. Remote lacunar infarcts again noted about the cerebellum, pons, thalami, and left posterior corona radiata. No mass lesion, significant mass effect, or midline shift. Stable ventricular size without hydrocephalus. No extra-axial fluid collection. Vascular: Susceptibility artifact now seen about the vertebrobasilar junction related to interval stenting. Major intracranial vascular flow voids are maintained. Skull and upper cervical spine: Craniocervical junction within normal limits. Bone marrow signal intensity normal. No new scalp soft tissue abnormality. Sinuses/Orbits: Globes and orbital soft tissues within normal limits. Moderate mucosal thickening throughout the paranasal sinuses with fluid within the nasopharynx. Patient is intubated. Mastoid air cells remain largely clear. Other: None. MRA HEAD FINDINGS Anterior circulation: Visualized distal cervical segments of the internal carotid arteries remain patent with antegrade flow. Petrous segments remain widely patent. Atheromatous irregularity about both carotid siphons with associated moderate stenosis at the supraclinoid left ICA. A1 segments patent. Normal  anterior communicating artery complex. Left ACA remains patent. Distal severe right A2/A3 stenosis noted (series 1, image 164), stable. Left M1 segment remains patent. Mild to moderate long segment right M1 stenosis again noted. Negative MCA bifurcations. No proximal MCA branch occlusion. Distal MCA branches remain perfused and symmetric, although demonstrate mild small vessel atheromatous irregularity. Posterior circulation: Heterogeneous atheromatous irregularity again noted about both proximal V4 segments as they course into the cranial vault. Multifocal moderate stenoses involving the proximal left V4 segment again noted. Left V4 remains patent to the takeoff of the left PICA which remains patent and well perfused. Occlusion of the distal left V4 segment beyond the left PICA again noted, stable. On the right, there has been interval placement of a vascular stent extending from the distal right V4 segment across the vertebrobasilar junction into the proximal basilar artery. This traverses the previously seen high-grade stenosis at this level. Grossly patent flow through the stent, although evaluation for possible intra stent stenosis limited by MRA. Stable and fairly robust flow seen within the basilar artery distally. Suspected short-segment fenestration noted at the distal basilar artery. Superior cerebellar arteries remain patent. Right PCA primarily supplied via the basilar. Left PCA supplied via the basilar as well as a robust left posterior communicating artery. Atheromatous disease involving both PCAs with associated severe bilateral P2 stenoses. PCAs remain patent to their distal aspects although demonstrate small vessel atheromatous irregularity. Anatomic variants:  None significant. IMPRESSION: MRI HEAD IMPRESSION: 1. 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 Additionally, there are new  scattered small volume ischemic infarcts involving the right cerebellum as well as the cortical aspects of the right greater than left occipital lobes. No associated mass effect. Single punctate focus of associated petechial hemorrhage at the right cerebellum as above. 2. Otherwise stable appearance of the brain with atrophy, chronic microvascular ischemic disease, and multiple chronic ischemic infarcts as above. MRA HEAD IMPRESSION: 1. Interval placement of a vascular stent extending from the distal right V4 segment across the vertebrobasilar junction into the proximal basilar artery. Grossly patent flow through the stent, although evaluation for possible intra stent stenosis is limited by MRA. Stable and fairly robust flow seen within the basilar artery distally. 2. Otherwise stable appearance of the intracranial circulation with moderate multifocal stenoses involving the proximal left V4 segment with distal left V4 occlusion, severe right A2/A3 stenosis, severe bilateral P2 stenoses, moderate supraclinoid left ICA stenosis, with mild to moderate long segment right M1 stenosis. Electronically Signed   By: Jeannine Boga M.D.   On: 08/05/2021 02:44   MR ANGIO HEAD WO CONTRAST  Result Date: 08/05/2021 CLINICAL DATA:  66 year old male with history of known medullary infarct and severe vertebrobasilar disease, status post catheter directed revascularization and stenting. EXAM: MRI HEAD WITHOUT CONTRAST MRA HEAD WITHOUT CONTRAST TECHNIQUE: Multiplanar, multi-echo pulse sequences of the brain and surrounding structures were acquired without intravenous contrast. Angiographic images of the Circle of Willis were acquired using MRA technique without intravenous contrast. COMPARISON:  Comparison made with arteriogram from earlier the same day as well as multiple previous studies. FINDINGS: MRI HEAD FINDINGS Brain: There has been interval expansion of previously identified ventral medullary infarct, now extending  posteriorly to traversed the entirety of the medulla to the floor of the fourth ventricle (series 5, images 63, 64, 65), slightly worse on the left. Additionally, there are new scattered patchy ischemic infarcts involving the right cerebellum. Few additional patchy small volume ischemic infarcts noted involving the cortical aspects of the right greater than left occipital lobes (series 5, images 79, 77, 75, 73, 72 no significant associated mass effect. Chronic right cerebellar microhemorrhage noted. There is a new small focus of susceptibility artifact within the adjacent right cerebellum, likely a punctate focus of petechial blood products (series 14, image 20). No other associated blood products. Remainder the brain is otherwise stable in appearance. Stable cerebral volume with chronic small vessel ischemic disease. No new focal parenchymal signal abnormality. Pituitary gland and suprasellar region remain within normal limits. Midline encephalomalacia with chronic hemosiderin staining at the anterior right frontal lobe again noted. Remote lacunar infarcts again noted about the cerebellum, pons, thalami, and left posterior corona radiata. No mass lesion, significant mass effect, or midline shift. Stable ventricular size without hydrocephalus. No extra-axial fluid collection. Vascular: Susceptibility artifact now seen about the vertebrobasilar junction related to interval stenting. Major intracranial vascular flow voids are maintained. Skull and upper cervical spine: Craniocervical junction within normal limits. Bone marrow signal intensity normal. No new scalp soft tissue abnormality. Sinuses/Orbits: Globes and orbital soft tissues within normal limits. Moderate mucosal thickening throughout the paranasal sinuses with fluid within the nasopharynx. Patient is intubated. Mastoid air cells remain largely clear. Other: None. MRA HEAD FINDINGS Anterior circulation: Visualized distal cervical segments of the internal  carotid arteries remain patent with antegrade flow. Petrous segments remain widely patent.  Atheromatous irregularity about both carotid siphons with associated moderate stenosis at the supraclinoid left ICA. A1 segments patent. Normal anterior communicating artery complex. Left ACA remains patent. Distal severe right A2/A3 stenosis noted (series 1, image 164), stable. Left M1 segment remains patent. Mild to moderate long segment right M1 stenosis again noted. Negative MCA bifurcations. No proximal MCA branch occlusion. Distal MCA branches remain perfused and symmetric, although demonstrate mild small vessel atheromatous irregularity. Posterior circulation: Heterogeneous atheromatous irregularity again noted about both proximal V4 segments as they course into the cranial vault. Multifocal moderate stenoses involving the proximal left V4 segment again noted. Left V4 remains patent to the takeoff of the left PICA which remains patent and well perfused. Occlusion of the distal left V4 segment beyond the left PICA again noted, stable. On the right, there has been interval placement of a vascular stent extending from the distal right V4 segment across the vertebrobasilar junction into the proximal basilar artery. This traverses the previously seen high-grade stenosis at this level. Grossly patent flow through the stent, although evaluation for possible intra stent stenosis limited by MRA. Stable and fairly robust flow seen within the basilar artery distally. Suspected short-segment fenestration noted at the distal basilar artery. Superior cerebellar arteries remain patent. Right PCA primarily supplied via the basilar. Left PCA supplied via the basilar as well as a robust left posterior communicating artery. Atheromatous disease involving both PCAs with associated severe bilateral P2 stenoses. PCAs remain patent to their distal aspects although demonstrate small vessel atheromatous irregularity. Anatomic variants: None  significant. IMPRESSION: MRI HEAD IMPRESSION: 1. 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 Additionally, there are new scattered small volume ischemic infarcts involving the right cerebellum as well as the cortical aspects of the right greater than left occipital lobes. No associated mass effect. Single punctate focus of associated petechial hemorrhage at the right cerebellum as above. 2. Otherwise stable appearance of the brain with atrophy, chronic microvascular ischemic disease, and multiple chronic ischemic infarcts as above. MRA HEAD IMPRESSION: 1. Interval placement of a vascular stent extending from the distal right V4 segment across the vertebrobasilar junction into the proximal basilar artery. Grossly patent flow through the stent, although evaluation for possible intra stent stenosis is limited by MRA. Stable and fairly robust flow seen within the basilar artery distally. 2. Otherwise stable appearance of the intracranial circulation with moderate multifocal stenoses involving the proximal left V4 segment with distal left V4 occlusion, severe right A2/A3 stenosis, severe bilateral P2 stenoses, moderate supraclinoid left ICA stenosis, with mild to moderate long segment right M1 stenosis. Electronically Signed   By: Jeannine Boga M.D.   On: 08/05/2021 02:44   DG Abd 1 View  Result Date: 08/04/2021 CLINICAL DATA:  NG tube placement EXAM: ABDOMEN - 1 VIEW COMPARISON:  10/11/2012 FINDINGS: Tip of NG tube is seen in the region of the antrum of the stomach. Bowel gas pattern is nonspecific. Pelvis is not included in its entirety. IMPRESSION: Tip of NG tube is seen in the stomach. Electronically Signed   By: Elmer Picker M.D.   On: 08/04/2021 17:32   DG Chest Port 1 View  Result Date: 08/04/2021 CLINICAL DATA:  Intubated EXAM: PORTABLE CHEST 1 VIEW COMPARISON:  08/04/2021  FINDINGS: Endotracheal tube tip is about 4.7 cm superior to the carina. Subsegmental atelectasis at the left base. Cardiomegaly. No consolidation, pleural effusion or pneumothorax IMPRESSION: 1. Endotracheal tube tip  about 4.7 cm superior to the carina. 2. Cardiomegaly.  Subsegmental atelectasis at the left base. Electronically Signed   By: Donavan Foil M.D.   On: 08/04/2021 15:10   DG Chest Port 1 View  Result Date: 08/04/2021 CLINICAL DATA:  Endotracheal intubation EXAM: PORTABLE CHEST 1 VIEW COMPARISON:  Earlier same day FINDINGS: Endotracheal tube tip 6 cm above the carina. Cardiomegaly persists. Pulmonary venous hypertension, possibly with early interstitial edema. Right lateral chest not included on the image. No evidence of collapse or effusion. IMPRESSION: Endotracheal tube tip 6 cm above the carina. Electronically Signed   By: Nelson Chimes M.D.   On: 08/04/2021 13:42   ECHOCARDIOGRAM COMPLETE  Result Date: 08/04/2021    ECHOCARDIOGRAM REPORT   Patient Name:   Joshua Donovan Date of Exam: 08/03/2021 Medical Rec #:  [id number]   Height:       67.0 in Accession #:    [id number]  Weight:       185.0 lb Date of Birth:  02/12/1955   BSA:          1.956 m Patient Age:    66 years    BP:           142/79 mmHg Patient Gender: M           HR:           76 bpm. Exam Location:  [facility] Procedure: 2D Echo, Cardiac Doppler and Color Doppler Indications:     I63.9 Stroke  History:         Patient has no prior history of Echocardiogram examinations.                  Risk Factors:Diabetes, Dyslipidemia, Hypertension and Former                  Smoker.  Sonographer:     Rosalia Hammers Referring Phys:  [id number] Athena Masse Diagnosing Phys: Oretta  1. Left ventricular ejection fraction, by estimation, is 60 to 65%. The left ventricle has normal function. The left ventricle has no regional wall motion abnormalities. There is moderate concentric left ventricular hypertrophy. Left ventricular diastolic  parameters are consistent with Grade I diastolic dysfunction (impaired relaxation).  2. Right ventricular systolic function is normal. The right ventricular size is normal.  3. The mitral valve is normal in structure. Trivial mitral valve regurgitation. No evidence of mitral stenosis.  4. The aortic valve is normal in structure. Aortic valve regurgitation is mild. No aortic stenosis is present.  5. The inferior vena cava is normal in size with greater than 50% respiratory variability, suggesting right atrial pressure of 3 mmHg. FINDINGS  Left Ventricle: Left ventricular ejection fraction, by estimation, is 60 to 65%. The left ventricle has normal function. The left ventricle has no regional wall motion abnormalities. The left ventricular internal cavity size was normal in size. There is  moderate concentric left ventricular hypertrophy. Left ventricular diastolic parameters are consistent with Grade I diastolic dysfunction (impaired relaxation). Right Ventricle: The right ventricular size is normal. No increase in right ventricular wall thickness. Right ventricular systolic function is normal. Left Atrium: Left atrial size was normal in size. Right Atrium: Right atrial size was normal in size. Pericardium: There is no evidence of pericardial effusion. Mitral Valve: The mitral valve is normal in structure. Trivial mitral valve regurgitation. No evidence of mitral valve stenosis. Tricuspid Valve: The tricuspid valve is normal in structure. Tricuspid valve regurgitation is trivial. No evidence of  tricuspid stenosis. Aortic Valve: The aortic valve is normal in structure. Aortic valve regurgitation is mild. Aortic regurgitation PHT measures 1577 msec. No aortic stenosis is present. Aortic valve mean gradient measures 5.0 mmHg. Aortic valve peak gradient measures 9.1 mmHg. Aortic valve area, by VTI measures 2.30 cm. Pulmonic Valve: The pulmonic valve was normal in structure. Pulmonic valve regurgitation is not  visualized. No evidence of pulmonic stenosis. Aorta: The aortic root is normal in size and structure. Venous: The inferior vena cava is normal in size with greater than 50% respiratory variability, suggesting right atrial pressure of 3 mmHg. IAS/Shunts: No atrial level shunt detected by color flow Doppler.  LEFT VENTRICLE PLAX 2D LVIDd:         3.84 cm   Diastology LVIDs:         2.17 cm   LV e' medial:    6.39 cm/s LV PW:         1.65 cm   LV E/e' medial:  9.6 LV IVS:        1.18 cm   LV e' lateral:   7.83 cm/s LVOT diam:     1.90 cm   LV E/e' lateral: 7.8 LV SV:         74 LV SV Index:   38 LVOT Area:     2.84 cm  RIGHT VENTRICLE RV Basal diam:  3.15 cm RV S prime:     17.40 cm/s TAPSE (M-mode): 2.4 cm LEFT ATRIUM             Index        RIGHT ATRIUM           Index LA diam:        3.20 cm 1.64 cm/m   RA Area:     18.10 cm LA Vol (A2C):   72.4 ml 37.01 ml/m  RA Volume:   47.10 ml  24.08 ml/m LA Vol (A4C):   65.8 ml 33.63 ml/m LA Biplane Vol: 69.3 ml 35.42 ml/m  AORTIC VALVE AV Area (Vmax):    2.10 cm AV Area (Vmean):   2.06 cm AV Area (VTI):     2.30 cm AV Vmax:           151.00 cm/s AV Vmean:          100.000 cm/s AV VTI:            0.323 m AV Peak Grad:      9.1 mmHg AV Mean Grad:      5.0 mmHg LVOT Vmax:         112.00 cm/s LVOT Vmean:        72.600 cm/s LVOT VTI:          0.262 m LVOT/AV VTI ratio: 0.81 AI PHT:            1577 msec  AORTA Ao Root diam: 3.20 cm MITRAL VALVE MV Area (PHT): 3.20 cm     SHUNTS MV Decel Time: 237 msec     Systemic VTI:  0.26 m MV E velocity: 61.30 cm/s   Systemic Diam: 1.90 cm MV A velocity: 101.00 cm/s MV E/A ratio:  0.61 Shaukat Khan Electronically signed by Neoma Laming Signature Date/Time: 08/04/2021/8:55:17 AM    Final    MR BRAIN WO CONTRAST  Result Date: 08/03/2021 CLINICAL DATA:  66 year old male with severe intracranial atherosclerosis and questionable medullary ischemia on brain MRI yesterday. EXAM: MRI HEAD WITHOUT CONTRAST TECHNIQUE: Multiplanar,  multiecho pulse sequences of the brain  and surrounding structures were obtained without intravenous contrast. COMPARISON:  CTA head and neck this morning.  Brain MRI yesterday. FINDINGS: Brain: More convincing appearance of ventral medullary restricted diffusion, asymmetrically greater to the left of midline. See series 5, image 12, series 7, images 18 and 19. Mild if any associated T2 and FLAIR hyperintensity there. And no other convincing diffusion restriction despite the CTA findings earlier today. 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 Chronic encephalomalacia right inferior frontal gyrus with hemosiderin. Occasional chronic microhemorrhages elsewhere, right cerebellum (series 13, image 19). No new signal abnormality compared to yesterday. No midline shift, mass effect, evidence of mass lesion, ventriculomegaly, extra-axial collection or acute intracranial hemorrhage. Cervicomedullary junction and pituitary are within normal limits. Vascular: Major intracranial vascular flow voids are stable from yesterday. See also CTA today reported separately. Skull and upper cervical spine: Negative. Visualized bone marrow signal is within normal limits. Sinuses/Orbits: Stable, negative. Other: Visible internal auditory structures appear normal. Mastoids are clear. Negative visible scalp and face. IMPRESSION: 1. Positive for ischemia/infarction of the ventral Medulla as suspected yesterday, slightly greater on the left. No associated hemorrhage or mass effect. 2. 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 These results were communicated to Dr. Rory Percy at 11:06 am on 08/03/2021 by text page via the [facility] messaging system. Electronically Signed   By: Genevie Ann M.D.   On: 08/03/2021 11:06   CT ANGIO HEAD NECK W WO CM  Addendum Date:  08/03/2021   ADDENDUM REPORT: 08/03/2021 09:17 ADDENDUM: Study discussed by telephone with Dr. Amie Portland on 08/03/2021 at 0911 hours. Electronically Signed   By: Genevie Ann M.D.   On: 08/03/2021 09:17   Result Date: 08/03/2021 CLINICAL DATA:  66 year old male with neurologic deficit. Questionable small vessel ischemia of the medulla on brain MRI yesterday. EXAM: CT ANGIOGRAPHY HEAD AND NECK TECHNIQUE: Multidetector CT imaging of the head and neck was performed using the standard protocol during bolus administration of intravenous contrast. Multiplanar CT image reconstructions and MIPs were obtained to evaluate the vascular anatomy. Carotid stenosis measurements (when applicable) are obtained utilizing NASCET criteria, using the distal internal carotid diameter as the denominator. RADIATION DOSE REDUCTION: This exam was performed according to the departmental dose-optimization program which includes automated exposure control, adjustment of the mA and/or kV according to patient size and/or use of iterative reconstruction technique. CONTRAST:  23mL OMNIPAQUE IOHEXOL 350 MG/ML SOLN COMPARISON:  Brain MRI yesterday.  Head CT yesterday. FINDINGS: CT HEAD Brain: Bulky Calcified atherosclerosis at the skull base. Stable CT appearance of the brainstem since yesterday. No acute intracranial hemorrhage identified. No midline shift, mass effect, or evidence of intracranial mass lesion. Chronic right inferior frontal gyrus encephalomalacia, patchy bilateral cerebral white matter hypodensity more pronounced on the left. 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 No ventriculomegaly or acute cortically based infarct. Calvarium and skull base: No acute osseous abnormality identified. Paranasal sinuses: Visualized paranasal sinuses and mastoids are stable and well aerated. Orbits: Visualized orbits and scalp soft tissues are within normal limits. CTA NECK Skeleton: Periapical dental lucency bilaterally. Ordinary  cervical spine degeneration. No acute osseous abnormality identified. Upper chest: Centrilobular and paraseptal emphysema. No superior mediastinal lymphadenopathy. Other neck: 15 mm hypodense right thyroid nodule. Recommend thyroid US (ref: J Am Coll Radiol. 2015 Feb;12(2): 143-50).Dermal and subcutaneous probable 2.6 cm sebaceous cyst of the left submandibular  face (series 13, image 79). Otherwise negative CT appearance of the neck soft tissues. Aortic arch: 3 vessel arch configuration. Mild arch atherosclerosis. Right carotid system: Negative brachiocephalic artery and proximal right CCA. Retropharyngeal course of the right carotid bifurcation with mild calcified plaque. Tortuous right ICA distal to the bulb, no stenosis. Left carotid system: Similar mild tortuosity and atherosclerosis with no hemodynamically significant stenosis. Vertebral arteries: Tortuous proximal right subclavian artery with a mildly kinked appearance in the superior mediastinum. Normal right vertebral artery origin. Patent right vertebral artery to the skull base with no significant plaque or stenosis. Mild to moderate soft and calcified plaque in the proximal left subclavian artery with less than 50 % stenosis with respect to the distal vessel. Mild atherosclerosis and stenosis at the left vertebral artery origin on series 13, image 159. Non dominant left vertebral artery than is patent to the skull base with no additional plaque or stenosis. CTA HEAD Posterior circulation: Bilateral V4 segment calcified plaque. Severe stenosis on the left (series 12, image 139) upstream of the left PICA origin which remains patent. But the left V4 segment is occluded distal to the left PICA. Contralateral right V4 moderate tandem stenoses upstream of the patent right PICA origin on series 12, image 130. And severe stenosis, near occlusion of the right vertebrobasilar junction (series 16, image 26). The basilar artery remains patent but is moderately  stenotic in the mid basilar segment on series 16, image 24. Distal basilar remains patent along with SCA and PCA origins. Left posterior communicating artery is present, the right is diminutive or absent. a mild to moderate stenosis left PCA origin. And bilateral P2 segment severe stenosis, near occlusion on series 15, image 53. But there is preserved distal PCA branch enhancement bilaterally. Anterior circulation: Both ICA siphons are patent. However, on the left there is bulky soft plaque or thrombus in the supraclinoid ICA (series 14, image 162) this is between normal appearing left ophthalmic and posterior communicating artery origins. Left carotid terminus remains patent. Contralateral right ICA siphon with mild to moderate calcified plaque and mild supraclinoid stenosis. Patent carotid termini, MCA and ACA origins. Tortuous A1 segments. Normal anterior communicating artery. Proximal A2 segments are normal. There is severe right ACA distal A2 segment stenosis, near occlusion on series 17, image 32. Left MCA M1 segment is mildly irregular. Left MCA bifurcation is patent without stenosis. Left MCA branches are mildly irregular. Mild to moderate somewhat long segment right MCA M1 stenosis along 6 mm series 16, image 20. Distal M1 and right MCA trifurcation remain patent. Right MCA branches are mildly irregular. Venous sinuses: Early contrast timing, grossly patent. Anatomic variants: Mildly dominant right vertebral artery. Review of the MIP images confirms the above findings IMPRESSION: 1. 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 - Moderate stenosis Mid Basilar Artery. - Near Occlusion bilateral PCA P2 segments, distal right ACA A2. - bulky soft plaque or thrombus in the Supraclinoid Left ICA, with up to Moderate stenosis. - mild-to-moderate Right MCA M1 stenosis. 2. Stable  CT appearance of the brain since yesterday. No new intracranial abnormality. 3. Aortic Atherosclerosis (ICD10-I70.0) and Emphysema (ICD10-J43.9). Electronically Signed: By: Genevie Ann M.D. On: 08/03/2021 09:01   US Carotid Bilateral (at [facility] and [facility] only)  Result Date: 08/03/2021 CLINICAL DATA:  Follow-up examination for stroke. EXAM: BILATERAL CAROTID DUPLEX ULTRASOUND TECHNIQUE: Pearline Cables scale imaging, color Doppler and duplex ultrasound were  performed of bilateral carotid and vertebral arteries in the neck. COMPARISON:  Prior Study from 04/03/2014. FINDINGS: Criteria: Quantification of carotid stenosis is based on velocity parameters that correlate the residual internal carotid diameter with NASCET-based stenosis levels, using the diameter of the distal internal carotid lumen as the denominator for stenosis measurement. The following velocity measurements were obtained: RIGHT ICA: 84/10 cm/sec CCA: 64/33 cm/sec SYSTOLIC ICA/CCA RATIO:  1.1 ECA: 64 cm/sec LEFT ICA: 64/16 cm/sec CCA: 295/18 cm/sec SYSTOLIC ICA/CCA RATIO:  0.9 ECA: 117 cm/sec RIGHT CAROTID ARTERY: Mild smooth intimal thickening seen within the visualized right CCA without significant stenosis. Mild heterogeneous mural based echogenic plaque seen about the right carotid bulb. No significant elevation in peak systolic velocity to suggest hemodynamically significant greater than 50% stenosis. Visualized right ICA patent distally without stenosis. RIGHT VERTEBRAL ARTERY:  Patent with antegrade flow. LEFT CAROTID ARTERY: Diffuse smooth intimal thickening seen within the visualized left CCA without significant stenosis. Mild scattered heterogeneous echogenic plaque about the left carotid bulb/proximal left ICA. No significant elevation in peak systolic velocity to suggest hemodynamically significant greater than 50% stenosis. Visualized left ICA patent distally without visible stenosis. LEFT VERTEBRAL ARTERY:  Patent with antegrade flow. IMPRESSION: 1. Mild  atherosclerotic plaque within the carotid bulbs and proximal ICAs bilaterally, but no hemodynamically significant greater than 50% stenosis. 2. Patent antegrade flow within both vertebral arteries. Electronically Signed   By: Jeannine Boga M.D.   On: 08/03/2021 03:26   MR Cervical Spine Wo Contrast  Result Date: 08/03/2021 CLINICAL DATA:  Initial evaluation for ataxia, right-sided numbness. EXAM: MRI CERVICAL SPINE WITHOUT CONTRAST TECHNIQUE: Multiplanar, multisequence MR imaging of the cervical spine was performed. No intravenous contrast was administered. COMPARISON:  None available. FINDINGS: Alignment: Examination degraded by motion artifact. Straightening with mild reversal of the normal cervical lordosis. No listhesis. Vertebrae: Vertebral body height maintained without acute or chronic fracture. Bone marrow signal intensity within normal limits. No discrete or worrisome osseous lesions or abnormal marrow edema. Cord: Normal signal and morphology. No convincing cord signal changes on this motion degraded exam. Posterior Fossa, vertebral arteries, paraspinal tissues: Unremarkable. Disc levels: C2-C3: Unremarkable. C3-C4: Mild disc bulge with endplate and uncovertebral spurring. Flattening and partial effacement of the ventral thecal sac with resultant mild spinal stenosis. Moderate bilateral C4 foraminal narrowing. C4-C5: Mild disc bulge with uncovertebral spurring. Mild flattening of the ventral thecal sac without significant spinal stenosis. Moderate right with mild left C5 foraminal stenosis. C5-C6: Minimal disc bulge with uncovertebral spurring. No spinal stenosis. Mild right C6 foraminal narrowing. Left neural foramen remains patent. C6-C7: Minimal annular disc bulge. No spinal stenosis. Foramina remain patent. C7-T1:  Unremarkable. Visualized upper thoracic spine demonstrates no significant finding. IMPRESSION: 1. Motion degraded exam. 2. No acute abnormality within the cervical spine. 3. Mild  multilevel cervical spondylosis with resultant mild spinal stenosis at C3-4. Associated moderate bilateral C4 foraminal stenosis, with mild right C5 and C6 foraminal narrowing. Electronically Signed   By: Jeannine Boga M.D.   On: 08/03/2021 01:04   MR BRAIN WO CONTRAST  Result Date: 08/03/2021 CLINICAL DATA:  Initial evaluation for neuro deficit, stroke suspected. EXAM: MRI HEAD WITHOUT CONTRAST TECHNIQUE: Multiplanar, multiecho pulse sequences of the brain and surrounding structures were obtained without intravenous contrast. COMPARISON:  Prior CT from earlier the same day as well as previous MRI from 09/17/2015. FINDINGS: Brain: Generalized age-related cerebral atrophy. Patchy T2/FLAIR hyperintensity involving the periventricular and deep white matter both cerebral hemispheres as well as the pons, most consistent  with chronic small vessel ischemic disease, moderately advanced in nature. Area of encephalomalacia of involving parasagittal anterior/inferior right frontal lobe noted at site of prior hemorrhage. Additional smaller foci of encephalomalacia involving the inferior temporal poles bilaterally likely posttraumatic in nature. Few scattered small remote left cerebellar infarcts noted. Subtle area of diffusion signal abnormality involving the ventral medulla measuring up to approximately 8 mm is seen (a series 5, image 12). While this finding could potentially be artifactual, possibly due to prominent vascular calcifications about the adjacent vertebral arteries, a possible small acute ischemic infarct could be considered. No associated hemorrhage or mass effect. No other evidence for acute or subacute ischemia. No acute intracranial hemorrhage. Single chronic microhemorrhage noted within the right cerebellum, of doubtful significance in isolation. No mass lesion, midline shift or mass effect. No hydrocephalus or extra-axial fluid collection. Pituitary gland and suprasellar region within normal  limits. Vascular: Major intracranial vascular flow voids are maintained. Skull and upper cervical spine: Craniocervical junction within normal limits. Bone marrow signal intensity normal. No scalp soft tissue abnormality. Sinuses/Orbits: Globes and orbital soft tissues within normal limits. Paranasal sinuses are largely clear. No mastoid effusion. Other: None. IMPRESSION: 1. 8 mm focus of diffusion signal abnormality involving the ventral medulla, indeterminate. While this finding could potentially be artifactual in nature, a possible small acute ischemic infarct is difficult to exclude, and could be considered in the correct clinical setting. No associated hemorrhage or mass effect. 2. No other acute intracranial abnormality. 3. Underlying atrophy with moderate chronic small vessel ischemic disease with a few small remote left cerebellar infarcts. Area of encephalomalacia of involving the anterior/inferior right frontal lobe at site of previous hemorrhage. Electronically Signed   By: Jeannine Boga M.D.   On: 08/03/2021 00:59   CT Head Wo Contrast  Result Date: 08/02/2021 CLINICAL DATA:  New right upper extremity tremor. Tingling to the arm. EXAM: CT HEAD WITHOUT CONTRAST TECHNIQUE: Contiguous axial images were obtained from the base of the skull through the vertex without intravenous contrast. RADIATION DOSE REDUCTION: This exam was performed according to the departmental dose-optimization program which includes automated exposure control, adjustment of the mA and/or kV according to patient size and/or use of iterative reconstruction technique. COMPARISON:  MRI brain 09/17/2015 FINDINGS: Brain: Mild cerebral atrophy. Mild ventricular dilatation consistent with central atrophy. Patchy low-attenuation changes in the deep white matter consistent small vessel ischemic change. Old area of encephalomalacia in the right anterior frontal lobe corresponding to previous intraparenchymal hemorrhage on prior MRI. No  acute mass effect or midline shift. No abnormal extra-axial fluid collections. Gray-white matter junctions are distinct. Basal cisterns are not effaced. No acute intracranial hemorrhage. Vascular: No hyperdense vessel or unexpected calcification. Skull: Normal. Negative for fracture or focal lesion. Sinuses/Orbits: Paranasal sinuses and mastoid air cells are clear. Other: None. IMPRESSION: No acute intracranial abnormalities. Chronic atrophy and small vessel ischemic changes. 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 Electronically Signed   By: Lucienne Capers M.D.   On: 08/02/2021 21:20   DG Foot Complete Right  Result Date: 08/02/2021 CLINICAL DATA:  Chronic RIGHT arm and leg pain for since Sunday, RIGHT leg cold to touch, swelling RIGHT great toe EXAM: RIGHT FOOT COMPLETE - 3+ VIEW COMPARISON:  None Available. FINDINGS: Osseous mineralization normal. 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 Remaining joint spaces preserved. No acute fracture, dislocation, or bone destruction. Small plantar calcaneal spur. IMPRESSION: Degenerative changes first MTP joint and small plantar calcaneal spur. No acute osseous abnormalities.  Electronically Signed   By: Lavonia Dana M.D.   On: 08/02/2021 20:51    Labs:  CBC: Recent Labs    08/02/21 2027 08/05/21 0531  WBC 6.4 12.5*  HGB 14.2 13.6  HCT 43.0 40.3  PLT 296 303    COAGS: Recent Labs    08/03/21 1129 08/04/21 2329 08/05/21 0531  APTT 28 45* 28    BMP: Recent Labs    08/02/21 2027 08/03/21 0347 08/05/21 0531  NA 136 136 140  K 3.1* 3.4* 3.3*  CL 101 101 107  CO2 25 24 25   GLUCOSE 182* 212* 222*  BUN 14 11 14   CALCIUM 9.9 9.0 7.9*  CREATININE 0.70 0.76 0.88  GFRNONAA >60 >60 >60    LIVER FUNCTION TESTS: Recent Labs    08/02/21 2027  BILITOT 0.5  AST 26  ALT 28  ALKPHOS 29*  PROT 7.3  ALBUMIN 4.2    Assessment and Plan:  66 y.o. 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 Gerhard Perches on 7/26. 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   Patient with Medial Medullary Syndrome with Quadreparesis/quadreplegia, failed extubation.  He remains intubated and sedated.  No EXT movement seen, pt arouse to verbal stimuli  R CFV puncture site stable, DP palpable left stronger than right.   Cont Brilinta 90 mg BID and ASA 81 mg qd.   Further treatment plan per Stroke/PCCM Appreciate and agree with the plan.  NIR to follow.    Electronically Signed: Tera Mater, PA-C 08/05/2021, 9:48 AM   I spent a total of 15 Minutes at the the patient's bedside AND on the patient's hospital floor or unit, greater than 50% of which was counseling/coordinating care for R VB junction s/p angioplasty and stent placement by Dr. Gerhard Perches on 7/26.   This chart was dictated using voice recognition software.  Despite best efforts to proofread,  errors can occur which can change the documentation meaning.

## 2021-08-05 NOTE — Consult Note (Signed)
Reason for Consult:need for enteral access Referring Physician: S. Ladamien Rammel is an 66 y.o. male.  HPI: 66yo M with PMHx remote TBI, HTN, and DM was admitted with a ventral medullary infarct. He underwent BA and R VBJ stenting by Neuro IR. He remains ventilator dependent and will be undergoing trach by CCM tomorrow. We are asked to place a PEG.  Past Medical History:  Diagnosis Date   Diabetes mellitus without complication (HCC)    GERD (gastroesophageal reflux disease)    Hemorrhage in the brain ([facility]) 08/2015   High cholesterol    Hyperlipidemia    Hypertension    Hypertensive emergency 08/03/2021    Past Surgical History:  Procedure Laterality Date   COLONOSCOPY WITH PROPOFOL N/A 09/08/2017   Procedure: COLONOSCOPY WITH PROPOFOL;  Surgeon: Lucilla Lame, MD;  Location: [facility];  Service: Endoscopy;  Laterality: N/A;  Diabetic - oral meds   IR INTRA CRAN STENT  08/04/2021   partial intestine removed     approx date: 1980   POLYPECTOMY  09/08/2017   Procedure: POLYPECTOMY;  Surgeon: Lucilla Lame, MD;  Location: [facility];  Service: Endoscopy;;   RADIOLOGY WITH ANESTHESIA N/A 08/04/2021   Procedure: Angioplasty/stenting of vertebrobasilar stenosis;  Surgeon: Luanne Bras, MD;  Location: [facility];  Service: Radiology;  Laterality: N/A;    Family History  Problem Relation Age of Onset   Hypertension Father    Diabetes Sister     Social History:  reports that he quit smoking about 33 years ago. He has never used smokeless tobacco. He reports current alcohol use of about 7.0 standard drinks of alcohol per week. He reports that he does not use drugs.  Allergies:  Allergies  Allergen Reactions   Lisinopril Swelling   Ace Inhibitors Swelling   Anchovies [Fish Allergy] Swelling   Sulfa Antibiotics Swelling    Medications: I have reviewed the patient's current medications.  Results for orders placed or performed during the hospital encounter of  08/03/21 (from the past 48 hour(s))  Glucose, capillary     Status: Abnormal   Collection Time: 08/03/21 10:32 PM  Result Value Ref Range   Glucose-Capillary 162 (H) 70 - 99 mg/dL    Comment: Glucose reference range applies only to samples taken after fasting for at least 8 hours.   Comment 1 Notify RN    Comment 2 Document in Chart   Surgical PCR screen     Status: Abnormal   Collection Time: 08/04/21  4:55 AM   Specimen: Nasal Mucosa; Nasal Swab  Result Value Ref Range   MRSA, PCR NEGATIVE NEGATIVE   Staphylococcus aureus POSITIVE (A) NEGATIVE    Comment: (NOTE) The Xpert SA Assay (FDA approved for NASAL specimens in patients 16 years of age and older), is one component of a comprehensive surveillance program. It is not intended to diagnose infection nor to guide or monitor treatment. Performed at [facility], [place] [address] [place], [place] [address]   Glucose, capillary     Status: Abnormal   Collection Time: 08/04/21  6:35 AM  Result Value Ref Range   Glucose-Capillary 176 (H) 70 - 99 mg/dL    Comment: Glucose reference range applies only to samples taken after fasting for at least 8 hours.   Comment 1 Notify RN    Comment 2 Document in Chart   Glucose, capillary     Status: Abnormal   Collection Time: 08/04/21  8:09 AM  Result Value Ref Range  Glucose-Capillary 155 (H) 70 - 99 mg/dL    Comment: Glucose reference range applies only to samples taken after fasting for at least 8 hours.  Glucose, capillary     Status: Abnormal   Collection Time: 08/04/21 10:25 AM  Result Value Ref Range   Glucose-Capillary 174 (H) 70 - 99 mg/dL    Comment: Glucose reference range applies only to samples taken after fasting for at least 8 hours.  POCT Activated clotting time     Status: None   Collection Time: 08/04/21 11:39 AM  Result Value Ref Range   Activated Clotting Time 179 seconds    Comment: Reference range 74-137 seconds for patients not on anticoagulant therapy.   Glucose, capillary     Status: Abnormal   Collection Time: 08/04/21 12:06 PM  Result Value Ref Range   Glucose-Capillary 170 (H) 70 - 99 mg/dL    Comment: Glucose reference range applies only to samples taken after fasting for at least 8 hours.  POCT Activated clotting time     Status: None   Collection Time: 08/04/21 12:08 PM  Result Value Ref Range   Activated Clotting Time 185 seconds    Comment: Reference range 74-137 seconds for patients not on anticoagulant therapy.  Glucose, capillary     Status: Abnormal   Collection Time: 08/04/21  1:19 PM  Result Value Ref Range   Glucose-Capillary 218 (H) 70 - 99 mg/dL    Comment: Glucose reference range applies only to samples taken after fasting for at least 8 hours.  Blood gas, arterial     Status: None   Collection Time: 08/04/21  1:48 PM  Result Value Ref Range   pH, Arterial 7.39 7.35 - 7.45   pCO2 arterial 38 32 - 48 mmHg   pO2, Arterial 100 83 - 108 mmHg   Bicarbonate 23.0 20.0 - 28.0 mmol/L   Acid-base deficit 1.7 0.0 - 2.0 mmol/L   O2 Saturation 100 %   Patient temperature 36.7    Collection site A-LINE    Drawn by [id number]    Allens test (pass/fail) PASS PASS    Comment: Performed at [facility] [address] [place], [place] [address]  Draw ABG 1 hour after initiation of ventilator     Status: Abnormal   Collection Time: 08/04/21  3:24 PM  Result Value Ref Range   pH, Arterial 7.34 (L) 7.35 - 7.45   pCO2 arterial 37 32 - 48 mmHg   pO2, Arterial 116 (H) 83 - 108 mmHg   Bicarbonate 20.0 20.0 - 28.0 mmol/L   Acid-base deficit 5.2 (H) 0.0 - 2.0 mmol/L   O2 Saturation 98.9 %   Patient temperature 36.9    Collection site A-LINE    Drawn by [id number]     Comment: Performed at [facility] [address] [place], [place] [address]  Glucose, capillary     Status: Abnormal   Collection Time: 08/04/21  5:44 PM  Result Value Ref Range   Glucose-Capillary 383 (H) 70 - 99 mg/dL    Comment: Glucose reference range  applies only to samples taken after fasting for at least 8 hours.  Glucose, capillary     Status: Abnormal   Collection Time: 08/04/21  7:37 PM  Result Value Ref Range   Glucose-Capillary 372 (H) 70 - 99 mg/dL    Comment: Glucose reference range applies only to samples taken after fasting for at least 8 hours.  APTT     Status:  Abnormal   Collection Time: 08/04/21 11:29 PM  Result Value Ref Range   aPTT 45 (H) 24 - 36 seconds    Comment:        IF BASELINE aPTT IS ELEVATED, SUGGEST PATIENT RISK ASSESSMENT BE USED TO DETERMINE APPROPRIATE ANTICOAGULANT THERAPY. Performed at [facility], [place] [address] [place], [place] [address]   Glucose, capillary     Status: Abnormal   Collection Time: 08/04/21 11:33 PM  Result Value Ref Range   Glucose-Capillary 292 (H) 70 - 99 mg/dL    Comment: Glucose reference range applies only to samples taken after fasting for at least 8 hours.  Glucose, capillary     Status: Abnormal   Collection Time: 08/05/21  3:22 AM  Result Value Ref Range   Glucose-Capillary 244 (H) 70 - 99 mg/dL    Comment: Glucose reference range applies only to samples taken after fasting for at least 8 hours.  CBC     Status: Abnormal   Collection Time: 08/05/21  5:31 AM  Result Value Ref Range   WBC 12.5 (H) 4.0 - 10.5 K/uL   RBC 4.61 4.22 - 5.81 MIL/uL   Hemoglobin 13.6 13.0 - 17.0 g/dL   HCT 40.3 39.0 - 52.0 %   MCV 87.4 80.0 - 100.0 fL   MCH 29.5 26.0 - 34.0 pg   MCHC 33.7 30.0 - 36.0 g/dL   RDW 14.4 11.5 - 15.5 %   Platelets 303 150 - 400 K/uL   nRBC 0.0 0.0 - 0.2 %    Comment: Performed at [facility], [place] [address], [place], [place] [address]  APTT     Status: None   Collection Time: 08/05/21  5:31 AM  Result Value Ref Range   aPTT 28 24 - 36 seconds    Comment: Performed at [facility] [address] [place], [place] [address]  Basic metabolic panel     Status: Abnormal   Collection Time: 08/05/21  5:31 AM  Result Value Ref Range    Sodium 140 135 - 145 mmol/L   Potassium 3.3 (L) 3.5 - 5.1 mmol/L   Chloride 107 98 - 111 mmol/L   CO2 25 22 - 32 mmol/L   Glucose, Bld 222 (H) 70 - 99 mg/dL    Comment: Glucose reference range applies only to samples taken after fasting for at least 8 hours.   BUN 14 8 - 23 mg/dL   Creatinine, Ser 0.88 0.61 - 1.24 mg/dL   Calcium 7.9 (L) 8.9 - 10.3 mg/dL   GFR, Estimated >60 >60 mL/min    Comment: (NOTE) Calculated using the CKD-EPI Creatinine Equation (2021)    Anion gap 8 5 - 15    Comment: Performed at [facility] [address], [place], [place] [address]  Glucose, capillary     Status: Abnormal   Collection Time: 08/05/21  8:31 AM  Result Value Ref Range   Glucose-Capillary 203 (H) 70 - 99 mg/dL    Comment: Glucose reference range applies only to samples taken after fasting for at least 8 hours.  Glucose, capillary     Status: Abnormal   Collection Time: 08/05/21 11:45 AM  Result Value Ref Range   Glucose-Capillary 194 (H) 70 - 99 mg/dL    Comment: Glucose reference range applies only to samples taken after fasting for at least 8 hours.  Magnesium     Status: Abnormal   Collection Time: 08/05/21  4:19 PM  Result Value Ref Range   Magnesium 3.0 (H) 1.7 - 2.4 mg/dL    Comment: Performed at [facility] [address], [place], [place] [address]  Phosphorus     Status: None   Collection Time: 08/05/21  4:19 PM  Result Value Ref Range   Phosphorus 2.9 2.5 - 4.6 mg/dL    Comment: Performed at [facility] [address], [place], [place] [address]  Glucose, capillary     Status: Abnormal   Collection Time: 08/05/21  4:38 PM  Result Value Ref Range   Glucose-Capillary 179 (H) 70 - 99 mg/dL    Comment: Glucose reference range applies only to samples taken after fasting for at least 8 hours.  Glucose, capillary     Status: Abnormal   Collection Time: 08/05/21  7:23 PM  Result Value Ref Range   Glucose-Capillary 175 (H) 70 - 99 mg/dL    Comment: Glucose  reference range applies only to samples taken after fasting for at least 8 hours.    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 He presented with progressively worsening weakness in the right lower extremity, left lower extremity, right upper extremity, and left upper extremity. On the morning of the procedure, patient was flaccid at the right-sided extremities and had severe weakness in the left-sided extremities (grade 1/5). 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 There was occlusion of the left vertebral artery seen distal to the origin of the PICA. 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 EXAM: 1.  Biplane right carotid and cerebral DSA angiogram. 2.  Biplane left common carotid and cerebral DSA cerebral angiogram 3. Indirect left vertebral and cerebral angiogram with tip of the catheter at the origin of the left vertebral artery 4.  Right subclavian arteriogram. 5. Selective catheterization of the right vertebral artery, biplane vertebral and cerebral DSA cerebral angiogram. 6. Selective catheterization of the right vertebral artery by using the 088 neuron max guide catheter, 5 French AXS catalyst catheter as an intermediate catheter and 021 phenom microcatheter with a 014 Arostotle microwire. 7. High magnification biplane cerebral angiogram and exchange of the microcatheter over an exchange length 014 zoom wire into an Apex 2.25 x 15 mm monorail balloon catheter and angioplasty of the severely 90% stenotic right vertebrobasilar junction. 8.  Resolute onyx 3 mm x 22 mm stent placement at the site of the stenosis. 9. Post stent biplane DSA cerebral angiography of the right vertebral artery injection. 10. Right common femoral arteriogram in the RAO projection after withdrawing the sheath with its tip in the external iliac artery and arteriogram Attending: Dr. Frazier Richards Assistant: Dr. Luanne Bras COMPARISON:  CORRELATION IS MADE WITH CTA OF THE HEAD AND NECK DATED August 03, 2021 MEDICATIONS: The antibiotic was administered within 1 hour of the procedure Injection Ancef 2 g Injection Integrilin 4.5 mg at 1.5 mg increments intra-arterially through the right vertebral artery Heparin 3500 units ANESTHESIA/SEDATION: Anesthesia was provided by the anesthesia team. Please refer to the anesthesia notes for detailed information. CONTRAST:  126 mL of Omnipaque 300 FLUOROSCOPY: Fluoroscopy Time: 48 minutes 48  seconds (3499 mGy). COMPLICATIONS: None immediate. TECHNIQUE: Informed written consent was obtained from the patient and his daughter after a thorough discussion of the procedural risks including but not limited to approximately 5% bleeding, hematoma, vascular injury, worsening of the stroke, ventilator dependency etc. Benefits and alternatives. All questions were addressed. Maximal Sterile Barrier Technique was utilized including caps, mask, sterile gowns, sterile gloves, sterile drape, hand hygiene and skin antiseptic. A timeout was performed prior to the initiation of the procedure. PROCEDURE: The right groin was prepped and draped in the usual sterile fashion. Thereafter using modified Seldinger technique, transfemoral access into the right common femoral artery was obtained without difficulty. Over a 0.035 inch guidewire, an 8 French x 25 cm pinnacle sheath was inserted. Through this, and also over 0.035 inch roadrunner wire, a 5 [place] JB 1 catheter was advanced to the aortic arch region and selectively positioned in the right common carotid artery,  and biplane DSA carotid and cerebral angiogram was obtained. Next, with the catheter tip in the left common carotid artery, biplane DSA carotid and cerebral Angiography of the head was obtained. The, selective catheterization of the left subclavian artery followed by angiogram was performed. With the tip of the catheter at the origin of the left vertebral artery, indirect left vertebral and biplane DSA cerebral angiography of left subclavian arterial injection was performed. Next, selective catheterization of the right subclavian artery was performed and the catheter was advanced into the proximal subclavian artery. Next, the wire was exchanged for an exchange length 035 Rosen wire. The JB 1 catheter was removed and 088 neuron max guide catheter and 5 French AXS catalyst catheter were advanced and selective catheterization of the left vertebral artery followed by vertebral and cerebral angiogram was performed. Next, under high magnification angiography, and under roadmap guidance, phenom 21 microcatheter and 0 1 for restarting microwire were advanced into the left posterior cerebral artery and angiogram was performed. Next, the wire was exchanged for a 014 zoom exchange length wire. Subsequently, we selected 2.25 x 15 mm Apex monorail microcatheter and under roadmap guidance, angioplasty of the severely stenotic right vertebrobasilar artery was performed. Post angioplasty angiogram was performed. Next, the balloon was removed. Measurements were made. Next we selected 3 mm x 22 mm resolute onyx stent and the stent was deployed at the right vertebrobasilar stenotic site. The stent was gently dilated and was inflated up to 10 atmospheric pressure. Next, post stent biplane DSA cerebral angiogram was performed. At this point in time, we felt the goal of the procedure was obtained. The catheter was removed. Femoral access site was further sterilely cleaned and the femoral sheath was withdrawn with its tip in the right  external iliac artery, femoral angiogram was performed in the RAO projection and the femoral access site was closed by using an 8 [place] Angio-Seal. Patient tolerated the procedure well and was transferred to the ICU intubated in stable condition. No immediate complications. FINDINGS: Right CCA: The right common carotid arteriogram demonstrates the right external carotid artery and its major branches to be widely patent. Right ICA: The right internal carotid artery at the bulb to the cranial skull base opacifies normally. No significant stenosis. Right Intracranial: The petrous and cavernous segments are widely patent. Mild 20-30% stenosis at the supraclinoid ICA. The right middle cerebral artery and the right anterior cerebral artery opacify normally into the capillary and venous phases. Mild-to-moderate atheromatous disease with about 30% stenosis in the right M1 segment. There is severe about 70% stenosis of  the right pericallosal branch of the ACA seen in the proximal aspect. There is cross filling via the anterior communicating artery seen with opacification of the left anterior cerebral artery. PCOM is patent with faint opacification of the right posterior cerebral artery. Right Sided Anterior Venous: The venous phase demonstrates preferential egress of contrast into the right transverse sinus, the sigmoid sinus and the right internal jugular vein. There is no early venous shunting into the venous structures at the skull base or intracranially into the dural sinuses. Left CCA: The left common carotid arteriogram demonstrates the left external carotid artery and its major branches to be widely patent. Left ICA: The left internal carotid artery at the bulb to the cranial skull base opacifies normally. Mild atheromatous disease at the origin of the ICA. There is mild stenosis seen about 2 cm from the origin of the left ICA measuring 42 percent by the NASCET criteria. Left Intracranial: The petrous, cavernous and  supraclinoid segments are widely patent. The left middle cerebral artery and the left anterior cerebral artery opacify normally into the capillary and venous phases. There is a prominent PCOM opacifying the bilateral posterior cerebral arteries via the distal basilar artery. Left Sided Anterior Venous: The venous phase demonstrates preferential egress of contrast into the right transverse sinus, the sigmoid sinus and the right internal jugular vein. There is opacification of the left transverse sigmoid sinuses and internal jugular veins seen as well. There is no early venous shunting into the venous structures at the skull base or intracranially into the dural sinuses. Indirect left vertebral and cerebral angiogram with injection of the left subclavian artery demonstrate faint opacification of the left vertebral artery. 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 High magnification biplane DSA cerebral angiography of right vertebral artery injection demonstrate severe about 90% stenosis at the right vertebrobasilar junction in length of about 7 mm. Post angioplasty angiogram demonstrate significant residual stenoses. Post stent angiogram demonstrate complete expansion of the stent with no residual stenosis. There is about 40-50% stenosis seen at the mid basilar artery. Right PICA, bilateral superior cerebellar, AICA and posterior cerebral arteries show moderate atheromatous disease. No significant stenosis. IMPRESSION: 1. Right vertebral and cerebral angiogram demonstrated about 90% stenosis at the vertebrobasilar junction. Successful angioplasty and stent placement without significant residual stenosis. 2.  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 3. 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 4. Mild atheromatous disease in the right intracranial ICA with 20-30% stenosis at the supraclinoid ICA and 30% stenosis in the right M1 segment. There is  about 70% stenoses of the right pericallosal branch of the ACA seen in the A3 segment. 5.  Bilateral P comms are patent with the left PCOM being prominent. PLAN: 1.  Blood pressure goals between 120-140 mm Hg. 2.  Patient to be on dual antiplatelet agents for 6 months. Electronically Signed   By: Frazier Richards M.D.   On: 08/05/2021 14:38   MR BRAIN WO CONTRAST  Result Date: 08/05/2021 CLINICAL DATA:  66 year old male with history of known medullary infarct and severe vertebrobasilar disease, status post catheter directed revascularization and stenting. EXAM: MRI HEAD WITHOUT CONTRAST MRA HEAD WITHOUT CONTRAST TECHNIQUE: Multiplanar, multi-echo pulse sequences of the brain and surrounding structures were acquired without intravenous contrast. Angiographic images of the Circle of Willis were acquired using MRA technique without intravenous contrast. COMPARISON:  Comparison made with arteriogram from earlier the same day as well as  multiple previous studies. FINDINGS: MRI HEAD FINDINGS Brain: There has been interval expansion of previously identified ventral medullary infarct, now extending posteriorly to traversed the entirety of the medulla to the floor of the fourth ventricle (series 5, images 63, 64, 65), slightly worse on the left. Additionally, there are new scattered patchy ischemic infarcts involving the right cerebellum. Few additional patchy small volume ischemic infarcts noted involving the cortical aspects of the right greater than left occipital lobes (series 5, images 79, 77, 75, 73, 72 no significant associated mass effect. Chronic right cerebellar microhemorrhage noted. There is a new small focus of susceptibility artifact within the adjacent right cerebellum, likely a punctate focus of petechial blood products (series 14, image 20). No other associated blood products. Remainder the brain is otherwise stable in appearance. Stable cerebral volume with chronic small vessel ischemic disease. No new  focal parenchymal signal abnormality. Pituitary gland and suprasellar region remain within normal limits. Midline encephalomalacia with chronic hemosiderin staining at the anterior right frontal lobe again noted. Remote lacunar infarcts again noted about the cerebellum, pons, thalami, and left posterior corona radiata. No mass lesion, significant mass effect, or midline shift. Stable ventricular size without hydrocephalus. No extra-axial fluid collection. Vascular: Susceptibility artifact now seen about the vertebrobasilar junction related to interval stenting. Major intracranial vascular flow voids are maintained. Skull and upper cervical spine: Craniocervical junction within normal limits. Bone marrow signal intensity normal. No new scalp soft tissue abnormality. Sinuses/Orbits: Globes and orbital soft tissues within normal limits. Moderate mucosal thickening throughout the paranasal sinuses with fluid within the nasopharynx. Patient is intubated. Mastoid air cells remain largely clear. Other: None. MRA HEAD FINDINGS Anterior circulation: Visualized distal cervical segments of the internal carotid arteries remain patent with antegrade flow. Petrous segments remain widely patent. Atheromatous irregularity about both carotid siphons with associated moderate stenosis at the supraclinoid left ICA. A1 segments patent. Normal anterior communicating artery complex. Left ACA remains patent. Distal severe right A2/A3 stenosis noted (series 1, image 164), stable. Left M1 segment remains patent. Mild to moderate long segment right M1 stenosis again noted. Negative MCA bifurcations. No proximal MCA branch occlusion. Distal MCA branches remain perfused and symmetric, although demonstrate mild small vessel atheromatous irregularity. Posterior circulation: Heterogeneous atheromatous irregularity again noted about both proximal V4 segments as they course into the cranial vault. Multifocal moderate stenoses involving the proximal  left V4 segment again noted. Left V4 remains patent to the takeoff of the left PICA which remains patent and well perfused. Occlusion of the distal left V4 segment beyond the left PICA again noted, stable. On the right, there has been interval placement of a vascular stent extending from the distal right V4 segment across the vertebrobasilar junction into the proximal basilar artery. This traverses the previously seen high-grade stenosis at this level. Grossly patent flow through the stent, although evaluation for possible intra stent stenosis limited by MRA. Stable and fairly robust flow seen within the basilar artery distally. Suspected short-segment fenestration noted at the distal basilar artery. Superior cerebellar arteries remain patent. Right PCA primarily supplied via the basilar. Left PCA supplied via the basilar as well as a robust left posterior communicating artery. Atheromatous disease involving both PCAs with associated severe bilateral P2 stenoses. PCAs remain patent to their distal aspects although demonstrate small vessel atheromatous irregularity. Anatomic variants: None significant. IMPRESSION: MRI HEAD IMPRESSION: 1. 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 Additionally, there  are new scattered small volume ischemic infarcts involving the right cerebellum as well as the cortical aspects of the right greater than left occipital lobes. No associated mass effect. Single punctate focus of associated petechial hemorrhage at the right cerebellum as above. 2. Otherwise stable appearance of the brain with atrophy, chronic microvascular ischemic disease, and multiple chronic ischemic infarcts as above. MRA HEAD IMPRESSION: 1. Interval placement of a vascular stent extending from the distal right V4 segment across the vertebrobasilar junction into the proximal basilar artery. Grossly patent  flow through the stent, although evaluation for possible intra stent stenosis is limited by MRA. Stable and fairly robust flow seen within the basilar artery distally. 2. Otherwise stable appearance of the intracranial circulation with moderate multifocal stenoses involving the proximal left V4 segment with distal left V4 occlusion, severe right A2/A3 stenosis, severe bilateral P2 stenoses, moderate supraclinoid left ICA stenosis, with mild to moderate long segment right M1 stenosis. Electronically Signed   By: Jeannine Boga M.D.   On: 08/05/2021 02:44   MR ANGIO HEAD WO CONTRAST  Result Date: 08/05/2021 CLINICAL DATA:  66 year old male with history of known medullary infarct and severe vertebrobasilar disease, status post catheter directed revascularization and stenting. EXAM: MRI HEAD WITHOUT CONTRAST MRA HEAD WITHOUT CONTRAST TECHNIQUE: Multiplanar, multi-echo pulse sequences of the brain and surrounding structures were acquired without intravenous contrast. Angiographic images of the Circle of Willis were acquired using MRA technique without intravenous contrast. COMPARISON:  Comparison made with arteriogram from earlier the same day as well as multiple previous studies. FINDINGS: MRI HEAD FINDINGS Brain: There has been interval expansion of previously identified ventral medullary infarct, now extending posteriorly to traversed the entirety of the medulla to the floor of the fourth ventricle (series 5, images 63, 64, 65), slightly worse on the left. Additionally, there are new scattered patchy ischemic infarcts involving the right cerebellum. Few additional patchy small volume ischemic infarcts noted involving the cortical aspects of the right greater than left occipital lobes (series 5, images 79, 77, 75, 73, 72 no significant associated mass effect. Chronic right cerebellar microhemorrhage noted. There is a new small focus of susceptibility artifact within the adjacent right cerebellum, likely a  punctate focus of petechial blood products (series 14, image 20). No other associated blood products. Remainder the brain is otherwise stable in appearance. Stable cerebral volume with chronic small vessel ischemic disease. No new focal parenchymal signal abnormality. Pituitary gland and suprasellar region remain within normal limits. Midline encephalomalacia with chronic hemosiderin staining at the anterior right frontal lobe again noted. Remote lacunar infarcts again noted about the cerebellum, pons, thalami, and left posterior corona radiata. No mass lesion, significant mass effect, or midline shift. Stable ventricular size without hydrocephalus. No extra-axial fluid collection. Vascular: Susceptibility artifact now seen about the vertebrobasilar junction related to interval stenting. Major intracranial vascular flow voids are maintained. Skull and upper cervical spine: Craniocervical junction within normal limits. Bone marrow signal intensity normal. No new scalp soft tissue abnormality. Sinuses/Orbits: Globes and orbital soft tissues within normal limits. Moderate mucosal thickening throughout the paranasal sinuses with fluid within the nasopharynx. Patient is intubated. Mastoid air cells remain largely clear. Other: None. MRA HEAD FINDINGS Anterior circulation: Visualized distal cervical segments of the internal carotid arteries remain patent with antegrade flow. Petrous segments remain widely patent. Atheromatous irregularity about both carotid siphons with associated moderate stenosis at the supraclinoid left ICA. A1 segments patent. Normal anterior communicating artery complex. Left ACA remains patent. Distal severe right A2/A3 stenosis  noted (series 1, image 164), stable. Left M1 segment remains patent. Mild to moderate long segment right M1 stenosis again noted. Negative MCA bifurcations. No proximal MCA branch occlusion. Distal MCA branches remain perfused and symmetric, although demonstrate mild small  vessel atheromatous irregularity. Posterior circulation: Heterogeneous atheromatous irregularity again noted about both proximal V4 segments as they course into the cranial vault. Multifocal moderate stenoses involving the proximal left V4 segment again noted. Left V4 remains patent to the takeoff of the left PICA which remains patent and well perfused. Occlusion of the distal left V4 segment beyond the left PICA again noted, stable. On the right, there has been interval placement of a vascular stent extending from the distal right V4 segment across the vertebrobasilar junction into the proximal basilar artery. This traverses the previously seen high-grade stenosis at this level. Grossly patent flow through the stent, although evaluation for possible intra stent stenosis limited by MRA. Stable and fairly robust flow seen within the basilar artery distally. Suspected short-segment fenestration noted at the distal basilar artery. Superior cerebellar arteries remain patent. Right PCA primarily supplied via the basilar. Left PCA supplied via the basilar as well as a robust left posterior communicating artery. Atheromatous disease involving both PCAs with associated severe bilateral P2 stenoses. PCAs remain patent to their distal aspects although demonstrate small vessel atheromatous irregularity. Anatomic variants: None significant. IMPRESSION: MRI HEAD IMPRESSION: 1. 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 Additionally, there are new scattered small volume ischemic infarcts involving the right cerebellum as well as the cortical aspects of the right greater than left occipital lobes. No associated mass effect. Single punctate focus of associated petechial hemorrhage at the right cerebellum as above. 2. Otherwise stable appearance of the brain with atrophy, chronic microvascular ischemic disease, and  multiple chronic ischemic infarcts as above. MRA HEAD IMPRESSION: 1. Interval placement of a vascular stent extending from the distal right V4 segment across the vertebrobasilar junction into the proximal basilar artery. Grossly patent flow through the stent, although evaluation for possible intra stent stenosis is limited by MRA. Stable and fairly robust flow seen within the basilar artery distally. 2. Otherwise stable appearance of the intracranial circulation with moderate multifocal stenoses involving the proximal left V4 segment with distal left V4 occlusion, severe right A2/A3 stenosis, severe bilateral P2 stenoses, moderate supraclinoid left ICA stenosis, with mild to moderate long segment right M1 stenosis. Electronically Signed   By: Jeannine Boga M.D.   On: 08/05/2021 02:44   DG Abd 1 View  Result Date: 08/04/2021 CLINICAL DATA:  NG tube placement EXAM: ABDOMEN - 1 VIEW COMPARISON:  10/11/2012 FINDINGS: Tip of NG tube is seen in the region of the antrum of the stomach. Bowel gas pattern is nonspecific. Pelvis is not included in its entirety. IMPRESSION: Tip of NG tube is seen in the stomach. Electronically Signed   By: Elmer Picker M.D.   On: 08/04/2021 17:32   DG Chest Port 1 View  Result Date: 08/04/2021 CLINICAL DATA:  Intubated EXAM: PORTABLE CHEST 1 VIEW COMPARISON:  08/04/2021 FINDINGS: Endotracheal tube tip is about 4.7 cm superior to the carina. Subsegmental atelectasis at the left base. Cardiomegaly. No consolidation, pleural effusion or pneumothorax IMPRESSION: 1. Endotracheal tube tip about 4.7 cm superior to the carina. 2. Cardiomegaly.  Subsegmental atelectasis at the left base. Electronically Signed   By: Donavan Foil M.D.   On: 08/04/2021 15:10  DG Chest Port 1 View  Result Date: 08/04/2021 CLINICAL DATA:  Endotracheal intubation EXAM: PORTABLE CHEST 1 VIEW COMPARISON:  Earlier same day FINDINGS: Endotracheal tube tip 6 cm above the carina. Cardiomegaly persists.  Pulmonary venous hypertension, possibly with early interstitial edema. Right lateral chest not included on the image. No evidence of collapse or effusion. IMPRESSION: Endotracheal tube tip 6 cm above the carina. Electronically Signed   By: Nelson Chimes M.D.   On: 08/04/2021 13:42    Review of Systems  Unable to perform ROS: Intubated   Blood pressure 116/74, pulse 71, temperature 97.7 F (36.5 C), temperature source Oral, resp. rate 19, height '5\' 7"'$  (1.702 m), weight 83.9 kg, SpO2 99 %. Physical Exam Constitutional:      Comments: On vent  HENT:     Nose: Nose normal.  Cardiovascular:     Rate and Rhythm: Normal rate and regular rhythm.  Pulmonary:     Breath sounds: No wheezing or rhonchi.     Comments: ventilated Abdominal:     General: Abdomen is flat. There is no distension.     Palpations: Abdomen is soft.     Tenderness: There is no abdominal tenderness.     Comments: Upper midline scar  Musculoskeletal:     Cervical back: Neck supple.  Skin:    General: Skin is warm.  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 We will plan PEG tomorrow by Dr. Bobbye Morton.  I called his daughter, Kathe Becton, and discussed the procedure, risks, and benefits. She agrees. Phone consent documented. Zenovia Jarred 08/05/2021, 7:42 PM

## 2021-08-05 NOTE — Progress Notes (Signed)
ANTICOAGULATION CONSULT NOTE - Follow Up Consult  Pharmacy Consult for Heparin infusion Indication:  post-IR cerebral angiogram  Allergies  Allergen Reactions   Lisinopril Swelling   Ace Inhibitors Swelling   Anchovies [Fish Allergy] Swelling   Sulfa Antibiotics Swelling    Patient Measurements: Height: '5\' 7"'$  (170.2 cm) Weight: 83.9 kg (185 lb) IBW/kg (Calculated) : 66.1 Heparin Dosing Weight: 83 kg  Vital Signs: Temp: 98 F (36.7 C) (07/27 0000) Temp Source: Axillary (07/27 0000) BP: 133/82 (07/27 0000) Pulse Rate: 75 (07/27 0000)  Labs: Recent Labs    08/02/21 2027 08/03/21 0347 08/03/21 1129 08/03/21 1809 08/04/21 2329  HGB 14.2  --   --   --   --   HCT 43.0  --   --   --   --   PLT 296  --   --   --   --   APTT  --   --  28  --  45*  HEPARINUNFRC  --   --   --  0.16*  --   CREATININE 0.70 0.76  --   --   --      Estimated Creatinine Clearance: 94 mL/min (by C-G formula based on SCr of 0.76 mg/dL).   Medical History: Past Medical History:  Diagnosis Date   Diabetes mellitus without complication ([place])    GERD (gastroesophageal reflux disease)    Hemorrhage in the brain ([place]) 08/2015   High cholesterol    Hyperlipidemia    Hypertension    Hypertensive emergency 08/03/2021    Medications:  Infusions:   sodium chloride Stopped (08/04/21 2224)   clevidipine     clevidipine Stopped (08/04/21 2348)   dexmedetomidine (PRECEDEX) IV infusion 0.4 mcg/kg/hr (08/05/21 0000)   fentaNYL infusion INTRAVENOUS 25 mcg/hr (08/05/21 0000)   heparin     heparin 10 Units/kg/hr (08/04/21 2000)    Assessment: 66 yo M transferred to [facility] on 7/26 for cerebral angiogram. Cerebral angiogram with 90% severe stenosis of R vertebro-basilar junction s/p stent. Noted L V4 occlusion distal to origin of PICA. Heparin gtt initiated in IR by neurosurgery. Pharmacy consulted to manage heparin infusion with aPTT goal of 50-60.   Pt not on any oral anticoagulation prior to  admission.   PM update: aPTT of 45 is subtherapeutic on heparin 850 units/hr but RN reported off for ~1 hr at MRI.   Goal of Therapy:  aPTT 50-60 seconds Monitor platelets by anticoagulation protocol: Yes   Plan:  Continue heparin infusion at current rate of 850 units/hr since heparin off for extensive time at MRI No re-check aPTT since results will come back after heparin off at 0200 per PA order (12 hr duration)  Cristela Felt, PharmD, BCPS Clinical Pharmacist 08/05/2021 12:20 AM

## 2021-08-06 ENCOUNTER — Inpatient Hospital Stay (HOSPITAL_COMMUNITY): Payer: Medicare PPO

## 2021-08-06 DIAGNOSIS — I639 Cerebral infarction, unspecified: Secondary | ICD-10-CM | POA: Diagnosis not present

## 2021-08-06 DIAGNOSIS — J9601 Acute respiratory failure with hypoxia: Secondary | ICD-10-CM | POA: Diagnosis not present

## 2021-08-06 DIAGNOSIS — I10 Essential (primary) hypertension: Secondary | ICD-10-CM | POA: Diagnosis not present

## 2021-08-06 DIAGNOSIS — F1010 Alcohol abuse, uncomplicated: Secondary | ICD-10-CM | POA: Diagnosis not present

## 2021-08-06 DIAGNOSIS — E782 Mixed hyperlipidemia: Secondary | ICD-10-CM | POA: Diagnosis not present

## 2021-08-06 DIAGNOSIS — E119 Type 2 diabetes mellitus without complications: Secondary | ICD-10-CM | POA: Diagnosis not present

## 2021-08-06 LAB — BASIC METABOLIC PANEL
Anion gap: 11 (ref 5–15)
BUN: 11 mg/dL (ref 8–23)
CO2: 22 mmol/L (ref 22–32)
Calcium: 8.2 mg/dL — ABNORMAL LOW (ref 8.9–10.3)
Chloride: 106 mmol/L (ref 98–111)
Creatinine, Ser: 0.77 mg/dL (ref 0.61–1.24)
GFR, Estimated: >60 mL/min (ref >60)
Glucose, Bld: 216 mg/dL — ABNORMAL HIGH (ref 70–99)
Potassium: 4.3 mmol/L (ref 3.5–5.1)
Sodium: 139 mmol/L (ref 135–145)

## 2021-08-06 LAB — URINALYSIS, ROUTINE W REFLEX MICROSCOPIC
Bilirubin Urine: NEGATIVE
Glucose, UA: 50 mg/dL — AB
Ketones, ur: NEGATIVE mg/dL
Leukocytes,Ua: NEGATIVE
Nitrite: NEGATIVE
Protein, ur: 100 mg/dL — AB
RBC / HPF: >50 RBC/hpf — ABNORMAL HIGH (ref 0–5)
Specific Gravity, Urine: 1.016 (ref 1.005–1.030)
pH: 6 (ref 5.0–8.0)

## 2021-08-06 LAB — GLUCOSE, CAPILLARY
Glucose-Capillary: 267 mg/dL — ABNORMAL HIGH (ref 70–99)
Glucose-Capillary: 175 mg/dL — ABNORMAL HIGH (ref 70–99)
Glucose-Capillary: 176 mg/dL — ABNORMAL HIGH (ref 70–99)
Glucose-Capillary: 172 mg/dL — ABNORMAL HIGH (ref 70–99)
Glucose-Capillary: 161 mg/dL — ABNORMAL HIGH (ref 70–99)
Glucose-Capillary: 179 mg/dL — ABNORMAL HIGH (ref 70–99)

## 2021-08-06 LAB — MAGNESIUM
Magnesium: 2.6 mg/dL — ABNORMAL HIGH (ref 1.7–2.4)
Magnesium: 2.1 mg/dL (ref 1.7–2.4)

## 2021-08-06 LAB — APTT: aPTT: 31 seconds (ref 24–36)

## 2021-08-06 LAB — CBC
HCT: 38.5 % — ABNORMAL LOW (ref 39.0–52.0)
Hemoglobin: 12.6 g/dL — ABNORMAL LOW (ref 13.0–17.0)
MCH: 29.2 pg (ref 26.0–34.0)
MCHC: 32.7 g/dL (ref 30.0–36.0)
MCV: 89.1 fL (ref 80.0–100.0)
Platelets: 270 10*3/uL (ref 150–400)
RBC: 4.32 MIL/uL (ref 4.22–5.81)
RDW: 14.7 % (ref 11.5–15.5)
WBC: 9 10*3/uL (ref 4.0–10.5)
nRBC: 0 % (ref 0.0–0.2)

## 2021-08-06 LAB — PHOSPHORUS
Phosphorus: 3.3 mg/dL (ref 2.5–4.6)
Phosphorus: 2.9 mg/dL (ref 2.5–4.6)

## 2021-08-06 MED ORDER — MIDAZOLAM HCL 2 MG/2ML IJ SOLN
INTRAMUSCULAR | Status: AC
  Administered 2021-08-06: 4 mg
  Filled 2021-08-06: qty 6

## 2021-08-06 MED ORDER — ETOMIDATE 2 MG/ML IV SOLN
20.0000 mg | Freq: Once | INTRAVENOUS | Status: AC
Start: 2021-08-06 — End: 2021-08-06
  Administered 2021-08-06: 20 mg via INTRAVENOUS
  Filled 2021-08-06: qty 10

## 2021-08-06 MED ORDER — MIDAZOLAM HCL 2 MG/2ML IJ SOLN
5.0000 mg | Freq: Once | INTRAMUSCULAR | Status: AC
Start: 2021-08-06 — End: 2021-08-06
  Administered 2021-08-06: 4 mg via INTRAVENOUS
  Filled 2021-08-06: qty 6

## 2021-08-06 MED ORDER — MIDAZOLAM HCL 2 MG/2ML IJ SOLN
INTRAMUSCULAR | Status: AC
  Administered 2021-08-06: 2 mg
  Filled 2021-08-06: qty 2

## 2021-08-06 MED ORDER — ROCURONIUM BROMIDE 50 MG/5ML IV SOLN
100.0000 mg | Freq: Once | INTRAVENOUS | Status: AC
Start: 2021-08-06 — End: 2021-08-06
  Administered 2021-08-06: 100 mg via INTRAVENOUS
  Filled 2021-08-06: qty 10

## 2021-08-06 MED ORDER — SODIUM CHLORIDE 0.9 % IV SOLN
2.0000 g | INTRAVENOUS | Status: DC
  Administered 2021-08-06 – 2021-08-07 (×2): 2 g via INTRAVENOUS
  Filled 2021-08-06 (×2): qty 20

## 2021-08-06 MED ORDER — LIDOCAINE-EPINEPHRINE (PF) 2 %-1:200000 IJ SOLN
INTRAMUSCULAR | Status: AC
  Filled 2021-08-06: qty 20

## 2021-08-06 MED ORDER — ROCURONIUM BROMIDE 10 MG/ML (PF) SYRINGE
PREFILLED_SYRINGE | INTRAVENOUS | Status: AC
  Filled 2021-08-06: qty 10

## 2021-08-06 MED ORDER — FENTANYL CITRATE PF 50 MCG/ML IJ SOSY
200.0000 ug | PREFILLED_SYRINGE | Freq: Once | INTRAMUSCULAR | Status: AC
  Administered 2021-08-06: 200 ug via INTRAVENOUS
  Filled 2021-08-06: qty 4

## 2021-08-06 MED ORDER — OSMOLITE 1.5 CAL PO LIQD
1000.0000 mL | ORAL | Status: DC
  Administered 2021-08-06 – 2021-08-11 (×6): 1000 mL

## 2021-08-06 NOTE — Progress Notes (Signed)
Pt placed on PS/CPAP 5/5 on 40% and is tolerating well. RT will monitor. 

## 2021-08-06 NOTE — Progress Notes (Signed)
Per MD PEG may be use for water and meds now and TF may be started in 4 hours.

## 2021-08-06 NOTE — Procedures (Signed)
Diagnostic Bronchoscopy  Adonis Yim  [id number]  Jul 01, 1955  Date:08/06/21  Time:2:22 PM   Provider Performing:Brooke Moshe Cipro   Procedure: Diagnostic Bronchoscopy [phone])  Indication(s) Assist with direct visualization of tracheostomy placement  Consent Risks of the procedure as well as the alternatives and risks of each were explained to the patient and/or caregiver.  Consent for the procedure was obtained.   Anesthesia See separate tracheostomy note   Time Out Verified patient identification, verified procedure, site/side was marked, verified correct patient position, special equipment/implants available, medications/allergies/relevant history reviewed, required imaging and test results available.   Sterile Technique Usual hand hygiene, masks, gowns, and gloves were used   Procedure Description Bronchoscope advanced through endotracheal tube and into airway.  After suctioning out tracheal secretions, bronchoscope used to provide direct visualization of tracheostomy placement.  Mucosa noted to be erythematous throughout airway.   Complications/Tolerance None; patient tolerated the procedure well.   EBL minimal  Specimen(s) None      Kennieth Rad, MSN, AG-ACNP-BC [facility] 08/06/2021, 2:22 PM  See Amion for pager If no response to pager, please call PCCM consult pager After 7:00 pm call [place]

## 2021-08-06 NOTE — Op Note (Signed)
   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 Time-out was performed verifying correct patient, procedure, signature of informed consent, and pre-operative antibiotics as indicated. MAC induction was uneventful and a bite block was placed into the oropharynx. The endoscope was inserted into the oropharynx and advanced down the esophagus into the stomach and into the duodenum. The visualized esophagus and duodenum were unremarkable. The endoscope was retracted back into the stomach and the stomach was insufflated. The stomach was inspected and other than mild gastritis, was also normal. Transillumination was performed. The light was visible on the external skin and dimpling of the stomach was noted endoscopically with manual pressure. The abdomen was prepped and draped in the usual sterile fashion. Transillumination and dimpling were repeated and local anesthetic was infiltrated to make a skin wheal at the site of transillumination. The needle was inserted perpendicularly to the skin and the tip of the needle was visualized endoscopically. As the needle was retracted, the tract was also anesthetized. A skin nick was made at the site of the wheal and an introducer needle and sheath were inserted. The needle was removed and guidewire inserted. The guidewire was grasped by an endoscopic snare and the snare, guidewire, and endoscope retracted out of the oropharynx. The PEG tube was secured to the guidewire and retracted  through the mouth and esophagus into the stomach. The PEG tube was secured with a bolster and was visualized endoscopically to spin freely circumferentially and also be without gaps between the internal bumper and the stomach wall. There was no evidence of bleeding. The PEG bolster was secured at 3cm at the skin and there were no gaps between the bolster and the abdominal wall. The stomach was desufflated endoscopically and the endoscope removed. The bite block was also removed. The patient tolerated the procedure well and there were no complications.   The patient may have water and medications administered via the PEG tube beginning immediately and tube feeds may be initiated four hours post-procedure.    Jesusita Oka, MD [facility]

## 2021-08-06 NOTE — Progress Notes (Signed)
PT Cancellation Note  Patient Details Name: Joshua Donovan MRN: [id number] DOB: Sep 11, 1955   Cancelled Treatment:    Reason Eval/Treat Not Completed: Medical issues which prohibited therapy;Patient at procedure or test/unavailable. Pt received trach and PEG today, RN asked PT to hold until tomorrow, will continue to follow and treat as time/schedule allows.   [name], PT, DPT   Acute Rehabilitation Department   [name] 08/06/2021, 3:31 PM

## 2021-08-06 NOTE — Progress Notes (Addendum)
NAME:  Joshua Donovan, MRN:  [id number], DOB:  Oct 19, 1955, LOS: 3 ADMISSION DATE:  08/03/2021, CONSULTATION DATE:  08/04/21 REFERRING MD:  Dr. Gerhard Perches, CHIEF COMPLAINT:  CVA   History of Present Illness:   66 year old male with prior history of HTN, TBI with prior ICH, DM, and HLD who presented to [facility] 7/24 with right hand numbness and right LE pain and weakness.  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  He was admitted for further stroke workup.  His weakness worsened to include right arm, right leg and left leg.  CTA angio head revealed multiple intracranial severe stenoses, near- occlusive stenosis of the vertebrobasilar junction, occlusion of the distal left V4, moderate mid basilar stenosis, nearly occlusive bilateral PCA P2 segments, nearly occlusive distal right ACA A2 segments and bulky soft plaque or thrombus in the supraclinoid left ICA with mild to moderate right M1 stenosis.  He was transferred to [facility] for further stroke care and Neuro IR evaluation on 7/25.  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  Additionally noted occlusion of left V4 distal to origin of PICA and patent bilateral P-comm arteries.  Heparin gtt ordered as well as plavix.  Remains on cleviprex gtt for strict SBP parameters.  Patient left intubated but weaning in PACU while awaiting ICU bed.  PCCM consulted for further ventilator management.    Pertinent  Medical History  TBI with ICH, DM, HTN, HLD  Significant Hospital Events: Including procedures, antibiotic start and stop dates in addition to other pertinent events   7/24 presented to [facility], ventral medulla CVA 7/25 tx to [facility] 7/26 cerebral angiogram with stent placement to right vertebro-basilar junction, failed extubation, left radial aline   7/26 MRI brain> mild extension of stroke, now involving bilateral medial medullary, patent basilar  artery and R VBJ stent    Interim History / Subjective:  No events.  Remains on low dose precedex only Plans for trach/ PEG this afternoon.  Remains NPO  Objective   Blood pressure (!) 162/91, pulse 69, temperature 99.4 F (37.4 C), temperature source Axillary, resp. rate (!) 24, height _0  (1.702 m), weight 83.9 kg, SpO2 100 %.    Vent Mode: PSV;CPAP FiO2 (%):  [40 %] 40 % Set Rate:  [18 bmp] 18 bmp Vt Set:  [520 mL] 520 mL PEEP:  [5 cmH20] 5 cmH20 Pressure Support:  [5 cmH20] 5 cmH20 Plateau Pressure:  [12 cmH20-16 cmH20] 16 cmH20   Intake/Output Summary (Last 24 hours) at 08/06/2021 1015 Last data filed at 08/06/2021 1000 Gross per 24 hour  Intake 2524.45 ml  Output 750 ml  Net 1774.45 ml   Filed Weights   08/04/21 0914  Weight: 83.9 kg   Examination: Precedex 0.5 General:  older adult male resting in bed comfortably but if stimulated, increased coughing HEENT: MM pink/moist, ETT/ OGT, pupils 3/reactive Neuro: opens eyes to commands, able to blink to command, slight protrusion of tongue, extremities remain flaccid CV: rr, NSR PULM:  non labored on MV, weaning PSV 10/5, scattered rhonchi with increasing tan secretions GI: soft, bs+, ND, foley- amber with sediment Extremities: warm/dry, generalized edema in UE Skin: no rashes   Tmax 99.4 Labs> reviewed, WBC 12.5> 9 Glucose trend 203> 146 UOP 837m/24hrs  Resolved Hospital Problem list    Assessment & Plan:   Acute respiratory insufficiency related to below - trach today around 1330 - cont full  MV support for now hopefully will be able to transition to trach collar soon - VAP prevention protocol/ PPI - PAD protocol for sedation> precedex, prn fentanyl - wean FiO2 as able for SpO2 >92%  - CXR post trach - given increased secretions, will send BAL during bedside trach, check MRSA pcr  - PEG today after trach per trauma.  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  Can likely restart some home meds later this afternoon after procedures  - d/c aline after procedures today   HLD - continue high dose statin  DM A1c 8.3 - continue SSI/ levemir for now while NPO, may need to adjust once TF's restarted  ?hx of ETOH abuse - reported 1 beer a day/ monitor for withdrawals - empiric thiamine/ folate/ MVI  Best Practice (right click and "Reselect all SmartList Selections" daily)   Diet/type: NPO;  TF per RD recs> restart 4hrs post PEG.  Scheduled bowel regimen DVT prophylaxis: prophylactic heparin  GI prophylaxis: PPI Lines: N/A Foley:  Yes, and it is no longer needed and removal ordered > frequent bladder scans to monitor for retention Code Status:  full code Last date of multidisciplinary goals of care discussion [per primary]  Daughter updated bedside 7/28.   Labs   CBC: Recent Labs  Lab 08/02/21 2027 08/05/21 0531 08/06/21 0512  WBC 6.4 12.5* 9.0  NEUTROABS 4.3  --   --   HGB 14.2 13.6 12.6*  HCT 43.0 40.3 38.5*  MCV 85.5 87.4 89.1  PLT 296 303 [id number]  Basic Metabolic Panel: Recent Labs  Lab 08/02/21 2027 08/03/21 0347 08/05/21 0531 08/05/21 1619 08/06/21 0512  NA 136 136 140  --  139  K 3.1* 3.4* 3.3*  --  4.3  CL 101 101 107  --  106  CO2 _0 --  22  GLUCOSE 182* 212* 222*  --  216*  BUN _1 --  11  CREATININE 0.70 0.76 0.88  --  0.77  CALCIUM 9.9 9.0 7.9*  --  8.2*  MG  --  1.6*  --  3.0* 2.6*  PHOS  --   --   --  2.9 3.3   GFR: Estimated Creatinine Clearance: 94  mL/min (by C-G formula based on SCr of 0.77 mg/dL). Recent Labs  Lab 08/02/21 2027 08/05/21 0531 08/06/21 0512  WBC 6.4 12.5* 9.0    Liver Function Tests: Recent Labs  Lab 08/02/21 2027  AST 26  ALT 28  ALKPHOS 29*  BILITOT 0.5  PROT 7.3  ALBUMIN 4.2   No results for input(s): "LIPASE", "AMYLASE" in the last 168 hours. No results for input(s): "AMMONIA" in the last 168 hours.  ABG    Component Value Date/Time   PHART 7.34 (L) 08/04/2021 1524   PCO2ART 37 08/04/2021 1524   PO2ART 116 (H) 08/04/2021 1524   HCO3 20.0 08/04/2021 1524   ACIDBASEDEF 5.2 (H) 08/04/2021 1524   O2SAT 98.9 08/04/2021 1524  Coagulation Profile: No results for input(s): "INR", "PROTIME" in the last 168 hours.  Cardiac Enzymes: No results for input(s): "CKTOTAL", "CKMB", "CKMBINDEX", "TROPONINI" in the last 168 hours.  HbA1C: Hemoglobin A1C  Date/Time Value Ref Range Status  02/20/2020 04:24 PM 10.8 (A) 4.0 - 5.6 % Final  01/22/2019 12:13 PM 7.0 (A) 4.0 - 5.6 % Final   Hgb A1c MFr Bld  Date/Time Value Ref Range Status  08/03/2021 03:47 AM 8.3 (H) 4.8 - 5.6 % Final    Comment:    (NOTE) Pre diabetes:          5.7%-6.4%  Diabetes:              >6.4%  Glycemic control for   <7.0% adults with diabetes   07/19/2015 07:29 PM 7.0 (H) 4.0 - 6.0 % Final    CBG: Recent Labs  Lab 08/05/21 1638 08/05/21 1923 08/05/21 2314 08/06/21 0321 08/06/21 0737  GLUCAP 179* 175* 146* 267* 175*    Critical care time: 30 mins     Kennieth Rad, ACNP  Pulmonary & Critical Care 08/06/2021, 10:15 AM  See Amion for pager If no response to pager, please call PCCM consult pager After 7:00 pm call Elink

## 2021-08-06 NOTE — Progress Notes (Signed)
SLP Cancellation Note  Patient Details Name: Joshua Donovan MRN: [id number] DOB: 12/19/1955   Cancelled treatment:       Reason Eval/Treat Not Completed: Medical issues which prohibited therapy. Plan for trach/PEG today. Will f/u for eval as able.     Osie Bond., M.A. [place] Office [phone]  Secure chat preferred  08/06/2021, 1:23 PM

## 2021-08-06 NOTE — Progress Notes (Signed)
Trauma/Critical Care Follow Up Note  Subjective:    Overnight Issues:   Objective:  Vital signs for last 24 hours: Temp:  [97.6 F (36.4 C)-99.4 F (37.4 C)] 99.4 F (37.4 C) (07/28 0800) Pulse Rate:  [61-86] 86 (07/28 0800) Resp:  [14-27] 17 (07/28 0800) BP: (91-152)/(61-101) 148/101 (07/28 0800) SpO2:  [97 %-100 %] 100 % (07/28 0800) Arterial Line BP: (113-172)/(53-78) 166/78 (07/28 0800) FiO2 (%):  [40 %] 40 % (07/28 0806)  Hemodynamic parameters for last 24 hours:    Intake/Output from previous day: 07/27 0701 - 07/28 0700 In: 2319 [I.V.:1391; NG/GT:421.7; IV Piggyback:506.3] Out: 093 [Urine:875]  Intake/Output this shift: No intake/output data recorded.  Vent settings for last 24 hours: Vent Mode: PSV;CPAP FiO2 (%):  [40 %] 40 % Set Rate:  [18 bmp] 18 bmp Vt Set:  [520 mL] 520 mL PEEP:  [5 cmH20] 5 cmH20 Pressure Support:  [5 cmH20] 5 cmH20 Plateau Pressure:  [12 cmH20-16 cmH20] 16 cmH20  Physical Exam:  Gen: comfortable, no distress HEENT: PERRL Neck: supple CV: RRR Pulm: unlabored breathing Abd: soft, NT GU: clear yellow urine Extr: wwp, no edema   Results for orders placed or performed during the hospital encounter of 08/03/21 (from the past 24 hour(s))  Glucose, capillary     Status: Abnormal   Collection Time: 08/05/21 11:45 AM  Result Value Ref Range   Glucose-Capillary 194 (H) 70 - 99 mg/dL  Magnesium     Status: Abnormal   Collection Time: 08/05/21  4:19 PM  Result Value Ref Range   Magnesium 3.0 (H) 1.7 - 2.4 mg/dL  Phosphorus     Status: None   Collection Time: 08/05/21  4:19 PM  Result Value Ref Range   Phosphorus 2.9 2.5 - 4.6 mg/dL  Glucose, capillary     Status: Abnormal   Collection Time: 08/05/21  4:38 PM  Result Value Ref Range   Glucose-Capillary 179 (H) 70 - 99 mg/dL  Glucose, capillary     Status: Abnormal   Collection Time: 08/05/21  7:23 PM  Result Value Ref Range   Glucose-Capillary 175 (H) 70 - 99 mg/dL  Glucose,  capillary     Status: Abnormal   Collection Time: 08/05/21 11:14 PM  Result Value Ref Range   Glucose-Capillary 146 (H) 70 - 99 mg/dL  Glucose, capillary     Status: Abnormal   Collection Time: 08/06/21  3:21 AM  Result Value Ref Range   Glucose-Capillary 267 (H) 70 - 99 mg/dL  CBC     Status: Abnormal   Collection Time: 08/06/21  5:12 AM  Result Value Ref Range   WBC 9.0 4.0 - 10.5 K/uL   RBC 4.32 4.22 - 5.81 MIL/uL   Hemoglobin 12.6 (L) 13.0 - 17.0 g/dL   HCT 38.5 (L) 39.0 - 52.0 %   MCV 89.1 80.0 - 100.0 fL   MCH 29.2 26.0 - 34.0 pg   MCHC 32.7 30.0 - 36.0 g/dL   RDW 14.7 11.5 - 15.5 %   Platelets 270 150 - 400 K/uL   nRBC 0.0 0.0 - 0.2 %  APTT     Status: None   Collection Time: 08/06/21  5:12 AM  Result Value Ref Range   aPTT 31 24 - 36 seconds  Basic metabolic panel     Status: Abnormal   Collection Time: 08/06/21  5:12 AM  Result Value Ref Range   Sodium 139 135 - 145 mmol/L   Potassium 4.3 3.5 - 5.1 mmol/L  Chloride 106 98 - 111 mmol/L   CO2 22 22 - 32 mmol/L   Glucose, Bld 216 (H) 70 - 99 mg/dL   BUN 11 8 - 23 mg/dL   Creatinine, Ser 0.77 0.61 - 1.24 mg/dL   Calcium 8.2 (L) 8.9 - 10.3 mg/dL   GFR, Estimated >60 >60 mL/min   Anion gap 11 5 - 15  Magnesium     Status: Abnormal   Collection Time: 08/06/21  5:12 AM  Result Value Ref Range   Magnesium 2.6 (H) 1.7 - 2.4 mg/dL  Phosphorus     Status: None   Collection Time: 08/06/21  5:12 AM  Result Value Ref Range   Phosphorus 3.3 2.5 - 4.6 mg/dL  Glucose, capillary     Status: Abnormal   Collection Time: 08/06/21  7:37 AM  Result Value Ref Range   Glucose-Capillary 175 (H) 70 - 99 mg/dL   Comment 1 Notify RN    Comment 2 Document in Chart     Assessment & Plan: The plan of care was discussed with the bedside nurse for the day, who is in agreement with this plan and no additional concerns were raised.   Present on Admission:  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[place])  Hypertension  Uncontrolled type 2  diabetes mellitus with hyperglycemia ([place])  Mixed hyperlipidemia  Alcohol abuse    LOS: 3 days   Additional comments:I reviewed the patient's new clinical lab test results.   and I reviewed the patients new imaging test results.    Stroke  Dysphagia - plan for PEG today  FEN - NPO for procedures today DVT - SCDs Dispo - ICU  Jesusita Oka, MD Trauma & General Surgery Please use [email or web address] to contact on call provider  08/06/2021  *Care during the described time interval was provided by me. I have reviewed this patient's available data, including medical history, events of note, physical examination and test results as part of my evaluation.

## 2021-08-06 NOTE — Procedures (Signed)
Percutaneous Tracheostomy Procedure Note   Thorsten Climer  [id number]  1955-08-03  Date:08/06/21  Time:2:31 PM   Provider Performing:Chelsei Mcchesney  Procedure: Percutaneous Tracheostomy with Bronchoscopic Guidance (50932)  Indication(s) Brainstem stroke leading to respiratory failure  Consent Risks of the procedure as well as the alternatives and risks of each were explained to the patient and/or caregiver.  Consent for the procedure was obtained.  Anesthesia Etomidate, Versed, Fentanyl, Vecuronium   Time Out Verified patient identification, verified procedure, site/side was marked, verified correct patient position, special equipment/implants available, medications/allergies/relevant history reviewed, required imaging and test results available.   Sterile Technique Maximal sterile technique including sterile barrier drape, hand hygiene, sterile gown, sterile gloves, mask, hair covering.    Procedure Description Appropriate anatomy identified by palpation.  Patient's neck prepped and draped in sterile fashion.  1% lidocaine with epinephrine was used to anesthetize skin overlying neck.  1.5cm incision made and blunt dissection performed until tracheal rings could be easily palpated.   Then a size 8 Shiley tracheostomy was placed under bronchoscopic visualization using usual Seldinger technique and serial dilation.   Bronchoscope confirmed placement above the carina.  Tracheostomy was sutured in place with adhesive pad to protect skin under pressure.    Patient connected to ventilator.   Complications/Tolerance None; patient tolerated the procedure well. Chest X-ray is ordered to confirm no post-procedural complication.   EBL Minimal   Specimen(s) None

## 2021-08-06 NOTE — Progress Notes (Addendum)
STROKE TEAM PROGRESS NOTE   SUBJECTIVE (INTERVAL HISTORY) His caregiver is at the bedside and his daughter was present via telephone during assessment this morning.  Pt was on vent but awake, alert, and following midline commands. 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  Plan for tracheostomy and PEG placement this afternoon. Patient is on precedex and cleviprex gtts in the ICU.  OBJECTIVE Temp:  [97.6 F (36.4 C)-99.6 F (37.6 C)] 99.6 F (37.6 C) (07/28 1200) Pulse Rate:  [61-105] 74 (07/28 1200) Cardiac Rhythm: Normal sinus rhythm (07/28 1200) Resp:  [14-30] 17 (07/28 1200) BP: (91-187)/(61-101) 180/85 (07/28 1200) SpO2:  [97 %-100 %] 100 % (07/28 1200) Arterial Line BP: (113-206)/(53-87) 132/87 (07/28 1200) FiO2 (%):  [40 %] 40 % (07/28 1151)  Recent Labs  Lab 08/05/21 1923 08/05/21 2314 08/06/21 0321 08/06/21 0737 08/06/21 1112  GLUCAP 175* 146* 267* 175* 176*    Recent Labs  Lab 08/02/21 2027 08/03/21 0347 08/05/21 0531 08/05/21 1619 08/06/21 0512  NA 136 136 140  --  139  K 3.1* 3.4* 3.3*  --  4.3  CL 101 101 107  --  106  CO2 '25 24 25  '$ --  22  GLUCOSE 182* 212* 222*  --  216*  BUN '14 11 14  '$ --  11  CREATININE 0.70 0.76 0.88  --  0.77  CALCIUM 9.9 9.0 7.9*  --  8.2*  MG  --  1.6*  --  3.0* 2.6*  PHOS  --   --   --  2.9 3.3    Recent Labs  Lab 08/02/21 2027  AST 26  ALT 28  ALKPHOS 29*  BILITOT 0.5  PROT 7.3  ALBUMIN 4.2    Recent Labs  Lab 08/02/21 2027 08/05/21 0531 08/06/21 0512  WBC 6.4 12.5* 9.0  NEUTROABS 4.3  --   --   HGB 14.2 13.6 12.6*  HCT 43.0 40.3 38.5*  MCV 85.5 87.4 89.1  PLT 296 303 270    No results for input(s): "CKTOTAL", "CKMB", "CKMBINDEX", "TROPONINI" in the last 168 hours. No results for input(s): "LABPROT", "INR" in the last 72 hours. No results for input(s): "COLORURINE", "LABSPEC", "PHURINE", "GLUCOSEU", "HGBUR", "BILIRUBINUR", "KETONESUR", "PROTEINUR", "UROBILINOGEN", "NITRITE",  "LEUKOCYTESUR" in the last 72 hours.  Invalid input(s): "APPERANCEUR"      Component Value Date/Time   CHOL 203 (H) 08/03/2021 0347   TRIG 175 (H) 08/03/2021 0347   HDL 52 08/03/2021 0347   CHOLHDL 3.9 08/03/2021 0347   VLDL 35 08/03/2021 0347   LDLCALC 116 (H) 08/03/2021 0347   Lab Results  Component Value Date   HGBA1C 8.3 (H) 08/03/2021   No results found for: "LABOPIA", "COCAINSCRNUR", "LABBENZ", "AMPHETMU", "THCU", "LABBARB"  No results for input(s): "ETH" in the last 168 hours.  I have personally reviewed the radiological images below and agree with the radiology interpretations.  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 He presented with progressively worsening weakness in the right lower extremity, left lower extremity, right upper extremity, and left upper extremity. On the morning of the procedure, patient was flaccid at the right-sided extremities and had severe weakness in the left-sided extremities (grade 1/5). 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 There  was 40-50% stenosis seen at the mid basilar artery. There was occlusion of the left vertebral artery seen distal to the origin of the PICA. 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 EXAM: 1.  Biplane right carotid and cerebral DSA angiogram. 2.  Biplane left common carotid and cerebral DSA cerebral angiogram 3. Indirect left vertebral and cerebral angiogram with tip of the catheter at the origin of the left vertebral artery 4.  Right subclavian arteriogram. 5. Selective catheterization of the right vertebral artery, biplane  vertebral and cerebral DSA cerebral angiogram. 6. Selective catheterization of the right vertebral artery by using the 088 neuron max guide catheter, 5 French AXS catalyst catheter as an intermediate catheter and 021 phenom microcatheter with a 014 Arostotle microwire. 7. High magnification biplane cerebral angiogram and exchange of the microcatheter over an exchange length 014 zoom wire into an Apex 2.25 x 15 mm monorail balloon catheter and angioplasty of the severely 90% stenotic right vertebrobasilar junction. 8. Resolute onyx 3 mm x 22 mm stent placement at the site of the stenosis. 9. Post stent biplane DSA cerebral angiography of the right vertebral artery injection. 10. Right common femoral arteriogram in the RAO projection after withdrawing the sheath with its tip in the external iliac artery and arteriogram Attending: Dr. Frazier Richards Assistant: Dr. Luanne Bras COMPARISON:  CORRELATION IS MADE WITH CTA OF THE HEAD AND NECK DATED August 03, 2021 MEDICATIONS: The antibiotic was administered within 1 hour of the procedure Injection Ancef 2 g Injection Integrilin 4.5 mg at 1.5 mg increments intra-arterially through the right vertebral artery Heparin 3500 units ANESTHESIA/SEDATION: Anesthesia was provided by the anesthesia team. Please refer to the anesthesia notes for detailed information. CONTRAST:  126 mL of Omnipaque 300 FLUOROSCOPY: Fluoroscopy Time: 48 minutes 48 seconds (3499 mGy). COMPLICATIONS: None immediate. TECHNIQUE: Informed written consent was obtained from the patient and his daughter after a thorough discussion of the procedural risks including but not limited to approximately 5% bleeding, hematoma, vascular injury, worsening of the stroke, ventilator dependency etc. Benefits and alternatives. All questions were addressed. Maximal Sterile Barrier Technique was utilized including caps, mask, sterile gowns, sterile gloves, sterile drape, hand hygiene and skin antiseptic. A timeout was  performed prior to the initiation of the procedure. PROCEDURE: The right groin was prepped and draped in the usual sterile fashion. Thereafter using modified Seldinger technique, transfemoral access into the right common femoral artery was obtained without difficulty. Over a 0.035 inch guidewire, an 8 French x 25 cm pinnacle sheath was inserted. Through this, and also over 0.035 inch roadrunner wire, a 5 [place] JB 1 catheter was advanced to the aortic arch region and selectively positioned in the right common carotid artery, and biplane DSA carotid and cerebral angiogram was obtained. Next, with the catheter tip in the left common carotid artery, biplane DSA carotid and cerebral Angiography of the head was obtained. The, selective catheterization of the left subclavian artery followed by angiogram was performed. With the tip of the catheter at the origin of the left vertebral artery, indirect left vertebral and biplane DSA cerebral angiography of left subclavian arterial injection was performed. Next, selective catheterization of the right subclavian artery was performed and the catheter was advanced into the proximal subclavian artery. Next, the wire was exchanged for an exchange length 035 Rosen wire. The JB 1 catheter was removed and 088 neuron max guide catheter and 5 French AXS catalyst catheter were advanced and selective catheterization  of the left vertebral artery followed by vertebral and cerebral angiogram was performed. Next, under high magnification angiography, and under roadmap guidance, phenom 21 microcatheter and 0 1 for restarting microwire were advanced into the left posterior cerebral artery and angiogram was performed. Next, the wire was exchanged for a 014 zoom exchange length wire. Subsequently, we selected 2.25 x 15 mm Apex monorail microcatheter and under roadmap guidance, angioplasty of the severely stenotic right vertebrobasilar artery was performed. Post angioplasty angiogram was  performed. Next, the balloon was removed. Measurements were made. Next we selected 3 mm x 22 mm resolute onyx stent and the stent was deployed at the right vertebrobasilar stenotic site. The stent was gently dilated and was inflated up to 10 atmospheric pressure. Next, post stent biplane DSA cerebral angiogram was performed. At this point in time, we felt the goal of the procedure was obtained. The catheter was removed. Femoral access site was further sterilely cleaned and the femoral sheath was withdrawn with its tip in the right external iliac artery, femoral angiogram was performed in the RAO projection and the femoral access site was closed by using an 8 [place] Angio-Seal. Patient tolerated the procedure well and was transferred to the ICU intubated in stable condition. No immediate complications. FINDINGS: Right CCA: The right common carotid arteriogram demonstrates the right external carotid artery and its major branches to be widely patent. Right ICA: The right internal carotid artery at the bulb to the cranial skull base opacifies normally. No significant stenosis. Right Intracranial: The petrous and cavernous segments are widely patent. Mild 20-30% stenosis at the supraclinoid ICA. The right middle cerebral artery and the right anterior cerebral artery opacify normally into the capillary and venous phases. Mild-to-moderate atheromatous disease with about 30% stenosis in the right M1 segment. There is severe about 70% stenosis of the right pericallosal branch of the ACA seen in the proximal aspect. There is cross filling via the anterior communicating artery seen with opacification of the left anterior cerebral artery. PCOM is patent with faint opacification of the right posterior cerebral artery. Right Sided Anterior Venous: The venous phase demonstrates preferential egress of contrast into the right transverse sinus, the sigmoid sinus and the right internal jugular vein. There is no early venous shunting  into the venous structures at the skull base or intracranially into the dural sinuses. Left CCA: The left common carotid arteriogram demonstrates the left external carotid artery and its major branches to be widely patent. Left ICA: The left internal carotid artery at the bulb to the cranial skull base opacifies normally. Mild atheromatous disease at the origin of the ICA. There is mild stenosis seen about 2 cm from the origin of the left ICA measuring 42 percent by the NASCET criteria. Left Intracranial: The petrous, cavernous and supraclinoid segments are widely patent. The left middle cerebral artery and the left anterior cerebral artery opacify normally into the capillary and venous phases. There is a prominent PCOM opacifying the bilateral posterior cerebral arteries via the distal basilar artery. Left Sided Anterior Venous: The venous phase demonstrates preferential egress of contrast into the right transverse sinus, the sigmoid sinus and the right internal jugular vein. There is opacification of the left transverse sigmoid sinuses and internal jugular veins seen as well. There is no early venous shunting into the venous structures at the skull base or intracranially into the dural sinuses. Indirect left vertebral and cerebral angiogram with injection of the left subclavian artery demonstrate faint opacification of the left vertebral artery.  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 High magnification biplane DSA cerebral angiography of right vertebral artery injection demonstrate severe about 90% stenosis at the right vertebrobasilar junction in length of about 7 mm. Post angioplasty angiogram demonstrate significant residual stenoses. Post stent angiogram demonstrate complete expansion of the stent with no residual stenosis. There is about 40-50% stenosis seen at the mid basilar artery. Right PICA, bilateral superior cerebellar, AICA and posterior cerebral arteries show  moderate atheromatous disease. No significant stenosis. IMPRESSION: 1. Right vertebral and cerebral angiogram demonstrated about 90% stenosis at the vertebrobasilar junction. Successful angioplasty and stent placement without significant residual stenosis. 2.  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 3. 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 4. Mild atheromatous disease in the right intracranial ICA with 20-30% stenosis at the supraclinoid ICA and 30% stenosis in the right M1 segment. There is about 70% stenoses of the right pericallosal branch of the ACA seen in the A3 segment. 5.  Bilateral P comms are patent with the left PCOM being prominent. PLAN: 1.  Blood pressure goals between 120-140 mm Hg. 2.  Patient to be on dual antiplatelet agents for 6 months. Electronically Signed   By: Frazier Richards M.D.   On: 08/05/2021 14:38   MR BRAIN WO CONTRAST  Result Date: 08/05/2021 CLINICAL DATA:  66 year old male with history of known medullary infarct and severe vertebrobasilar disease, status post catheter directed revascularization and stenting. EXAM: MRI HEAD WITHOUT CONTRAST MRA HEAD WITHOUT CONTRAST TECHNIQUE: Multiplanar, multi-echo pulse sequences of the brain and surrounding structures were acquired without intravenous contrast. Angiographic images of the Circle of Willis were acquired using MRA technique without intravenous contrast. COMPARISON:  Comparison made with arteriogram from earlier the same day as well as multiple previous studies. FINDINGS: MRI HEAD FINDINGS Brain: There has been interval expansion of previously identified ventral medullary infarct, now extending posteriorly to traversed the entirety of the medulla to the floor of the fourth ventricle (series 5, images 63, 64, 65), slightly worse on the left. Additionally, there are new scattered patchy ischemic infarcts involving the right cerebellum. Few additional patchy small volume ischemic infarcts  noted involving the cortical aspects of the right greater than left occipital lobes (series 5, images 79, 77, 75, 73, 72 no significant associated mass effect. Chronic right cerebellar microhemorrhage noted. There is a new small focus of susceptibility artifact within the adjacent right cerebellum, likely a punctate focus of petechial blood products (series 14, image 20). No other associated blood products. Remainder the brain is otherwise stable in appearance. Stable cerebral volume with chronic small vessel ischemic disease. No new focal parenchymal signal abnormality. Pituitary gland and suprasellar region remain within normal limits. Midline encephalomalacia with chronic hemosiderin staining at the anterior right frontal lobe again noted. Remote lacunar infarcts again noted about the cerebellum, pons, thalami, and left posterior corona radiata. No mass lesion, significant mass effect, or midline shift. Stable ventricular size without hydrocephalus. No extra-axial fluid collection. Vascular: Susceptibility artifact now seen about the vertebrobasilar junction related to interval stenting. Major intracranial vascular flow voids are maintained. Skull and upper cervical spine: Craniocervical junction within normal limits. Bone marrow signal intensity normal. No new scalp soft tissue abnormality. Sinuses/Orbits: Globes and orbital soft tissues within normal limits. Moderate mucosal thickening throughout the paranasal sinuses with fluid within the nasopharynx. Patient is intubated. Mastoid air cells remain largely clear. Other: None. MRA HEAD FINDINGS Anterior circulation: Visualized  distal cervical segments of the internal carotid arteries remain patent with antegrade flow. Petrous segments remain widely patent. Atheromatous irregularity about both carotid siphons with associated moderate stenosis at the supraclinoid left ICA. A1 segments patent. Normal anterior communicating artery complex. Left ACA remains patent.  Distal severe right A2/A3 stenosis noted (series 1, image 164), stable. Left M1 segment remains patent. Mild to moderate long segment right M1 stenosis again noted. Negative MCA bifurcations. No proximal MCA branch occlusion. Distal MCA branches remain perfused and symmetric, although demonstrate mild small vessel atheromatous irregularity. Posterior circulation: Heterogeneous atheromatous irregularity again noted about both proximal V4 segments as they course into the cranial vault. Multifocal moderate stenoses involving the proximal left V4 segment again noted. Left V4 remains patent to the takeoff of the left PICA which remains patent and well perfused. Occlusion of the distal left V4 segment beyond the left PICA again noted, stable. On the right, there has been interval placement of a vascular stent extending from the distal right V4 segment across the vertebrobasilar junction into the proximal basilar artery. This traverses the previously seen high-grade stenosis at this level. Grossly patent flow through the stent, although evaluation for possible intra stent stenosis limited by MRA. Stable and fairly robust flow seen within the basilar artery distally. Suspected short-segment fenestration noted at the distal basilar artery. Superior cerebellar arteries remain patent. Right PCA primarily supplied via the basilar. Left PCA supplied via the basilar as well as a robust left posterior communicating artery. Atheromatous disease involving both PCAs with associated severe bilateral P2 stenoses. PCAs remain patent to their distal aspects although demonstrate small vessel atheromatous irregularity. Anatomic variants: None significant. IMPRESSION: MRI HEAD IMPRESSION: 1. 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 Additionally, there are new scattered small volume ischemic infarcts involving the right  cerebellum as well as the cortical aspects of the right greater than left occipital lobes. No associated mass effect. Single punctate focus of associated petechial hemorrhage at the right cerebellum as above. 2. Otherwise stable appearance of the brain with atrophy, chronic microvascular ischemic disease, and multiple chronic ischemic infarcts as above. MRA HEAD IMPRESSION: 1. Interval placement of a vascular stent extending from the distal right V4 segment across the vertebrobasilar junction into the proximal basilar artery. Grossly patent flow through the stent, although evaluation for possible intra stent stenosis is limited by MRA. Stable and fairly robust flow seen within the basilar artery distally. 2. Otherwise stable appearance of the intracranial circulation with moderate multifocal stenoses involving the proximal left V4 segment with distal left V4 occlusion, severe right A2/A3 stenosis, severe bilateral P2 stenoses, moderate supraclinoid left ICA stenosis, with mild to moderate long segment right M1 stenosis. Electronically Signed   By: Jeannine Boga M.D.   On: 08/05/2021 02:44   MR ANGIO HEAD WO CONTRAST  Result Date: 08/05/2021 CLINICAL DATA:  66 year old male with history of known medullary infarct and severe vertebrobasilar disease, status post catheter directed revascularization and stenting. EXAM: MRI HEAD WITHOUT CONTRAST MRA HEAD WITHOUT CONTRAST TECHNIQUE: Multiplanar, multi-echo pulse sequences of the brain and surrounding structures were acquired without intravenous contrast. Angiographic images of the Circle of Willis were acquired using MRA technique without intravenous contrast. COMPARISON:  Comparison made with arteriogram from earlier the same day as well as multiple previous studies. FINDINGS: MRI HEAD FINDINGS Brain: There has been interval expansion of previously identified ventral medullary infarct, now extending posteriorly to traversed  the entirety of the medulla to the  floor of the fourth ventricle (series 5, images 63, 64, 65), slightly worse on the left. Additionally, there are new scattered patchy ischemic infarcts involving the right cerebellum. Few additional patchy small volume ischemic infarcts noted involving the cortical aspects of the right greater than left occipital lobes (series 5, images 79, 77, 75, 73, 72 no significant associated mass effect. Chronic right cerebellar microhemorrhage noted. There is a new small focus of susceptibility artifact within the adjacent right cerebellum, likely a punctate focus of petechial blood products (series 14, image 20). No other associated blood products. Remainder the brain is otherwise stable in appearance. Stable cerebral volume with chronic small vessel ischemic disease. No new focal parenchymal signal abnormality. Pituitary gland and suprasellar region remain within normal limits. Midline encephalomalacia with chronic hemosiderin staining at the anterior right frontal lobe again noted. Remote lacunar infarcts again noted about the cerebellum, pons, thalami, and left posterior corona radiata. No mass lesion, significant mass effect, or midline shift. Stable ventricular size without hydrocephalus. No extra-axial fluid collection. Vascular: Susceptibility artifact now seen about the vertebrobasilar junction related to interval stenting. Major intracranial vascular flow voids are maintained. Skull and upper cervical spine: Craniocervical junction within normal limits. Bone marrow signal intensity normal. No new scalp soft tissue abnormality. Sinuses/Orbits: Globes and orbital soft tissues within normal limits. Moderate mucosal thickening throughout the paranasal sinuses with fluid within the nasopharynx. Patient is intubated. Mastoid air cells remain largely clear. Other: None. MRA HEAD FINDINGS Anterior circulation: Visualized distal cervical segments of the internal carotid arteries remain patent with antegrade flow. Petrous  segments remain widely patent. Atheromatous irregularity about both carotid siphons with associated moderate stenosis at the supraclinoid left ICA. A1 segments patent. Normal anterior communicating artery complex. Left ACA remains patent. Distal severe right A2/A3 stenosis noted (series 1, image 164), stable. Left M1 segment remains patent. Mild to moderate long segment right M1 stenosis again noted. Negative MCA bifurcations. No proximal MCA branch occlusion. Distal MCA branches remain perfused and symmetric, although demonstrate mild small vessel atheromatous irregularity. Posterior circulation: Heterogeneous atheromatous irregularity again noted about both proximal V4 segments as they course into the cranial vault. Multifocal moderate stenoses involving the proximal left V4 segment again noted. Left V4 remains patent to the takeoff of the left PICA which remains patent and well perfused. Occlusion of the distal left V4 segment beyond the left PICA again noted, stable. On the right, there has been interval placement of a vascular stent extending from the distal right V4 segment across the vertebrobasilar junction into the proximal basilar artery. This traverses the previously seen high-grade stenosis at this level. Grossly patent flow through the stent, although evaluation for possible intra stent stenosis limited by MRA. Stable and fairly robust flow seen within the basilar artery distally. Suspected short-segment fenestration noted at the distal basilar artery. Superior cerebellar arteries remain patent. Right PCA primarily supplied via the basilar. Left PCA supplied via the basilar as well as a robust left posterior communicating artery. Atheromatous disease involving both PCAs with associated severe bilateral P2 stenoses. PCAs remain patent to their distal aspects although demonstrate small vessel atheromatous irregularity. Anatomic variants: None significant. IMPRESSION: MRI HEAD IMPRESSION: 1. Interval  expansion of previously identified ventral medullary infarct, now extending posteriorly to traverse the medulla to the floor of the fourth ventricle. Additionally, there are new scattered small volume ischemic infarcts involving the right cerebellum as well as the cortical aspects of the right greater than left  occipital lobes. No associated mass effect. Single punctate focus of associated petechial hemorrhage at the right cerebellum as above. 2. Otherwise stable appearance of the brain with atrophy, chronic microvascular ischemic disease, and multiple chronic ischemic infarcts as above. MRA HEAD IMPRESSION: 1. Interval placement of a vascular stent extending from the distal right V4 segment across the vertebrobasilar junction into the proximal basilar artery. Grossly patent flow through the stent, although evaluation for possible intra stent stenosis is limited by MRA. Stable and fairly robust flow seen within the basilar artery distally. 2. Otherwise stable appearance of the intracranial circulation with moderate multifocal stenoses involving the proximal left V4 segment with distal left V4 occlusion, severe right A2/A3 stenosis, severe bilateral P2 stenoses, moderate supraclinoid left ICA stenosis, with mild to moderate long segment right M1 stenosis. Electronically Signed   By: Jeannine Boga M.D.   On: 08/05/2021 02:44   DG Abd 1 View  Result Date: 08/04/2021 CLINICAL DATA:  NG tube placement EXAM: ABDOMEN - 1 VIEW COMPARISON:  10/11/2012 FINDINGS: Tip of NG tube is seen in the region of the antrum of the stomach. Bowel gas pattern is nonspecific. Pelvis is not included in its entirety. IMPRESSION: Tip of NG tube is seen in the stomach. Electronically Signed   By: Elmer Picker M.D.   On: 08/04/2021 17:32   DG Chest Port 1 View  Result Date: 08/04/2021 CLINICAL DATA:  Intubated EXAM: PORTABLE CHEST 1 VIEW COMPARISON:  08/04/2021 FINDINGS: Endotracheal tube tip is about 4.7 cm superior to the  carina. Subsegmental atelectasis at the left base. Cardiomegaly. No consolidation, pleural effusion or pneumothorax IMPRESSION: 1. Endotracheal tube tip about 4.7 cm superior to the carina. 2. Cardiomegaly.  Subsegmental atelectasis at the left base. Electronically Signed   By: Donavan Foil M.D.   On: 08/04/2021 15:10   DG Chest Port 1 View  Result Date: 08/04/2021 CLINICAL DATA:  Endotracheal intubation EXAM: PORTABLE CHEST 1 VIEW COMPARISON:  Earlier same day FINDINGS: Endotracheal tube tip 6 cm above the carina. Cardiomegaly persists. Pulmonary venous hypertension, possibly with early interstitial edema. Right lateral chest not included on the image. No evidence of collapse or effusion. IMPRESSION: Endotracheal tube tip 6 cm above the carina. Electronically Signed   By: Nelson Chimes M.D.   On: 08/04/2021 13:42   ECHOCARDIOGRAM COMPLETE  Result Date: 08/04/2021    ECHOCARDIOGRAM REPORT   Patient Name:   Joshua Donovan Date of Exam: 08/03/2021 Medical Rec #:  [id number]   Height:       67.0 in Accession #:    [id number]  Weight:       185.0 lb Date of Birth:  04-Mar-1955   BSA:          1.956 m Patient Age:    76 years    BP:           142/79 mmHg Patient Gender: M           HR:           76 bpm. Exam Location:  [facility] Procedure: 2D Echo, Cardiac Doppler and Color Doppler Indications:     I63.9 Stroke  History:         Patient has no prior history of Echocardiogram examinations.                  Risk Factors:Diabetes, Dyslipidemia, Hypertension and Former                  Smoker.  Sonographer:  Rosalia Hammers Referring Phys:  [id number] Athena Masse Diagnosing Phys: [place]  1. Left ventricular ejection fraction, by estimation, is 60 to 65%. The left ventricle has normal function. The left ventricle has no regional wall motion abnormalities. There is moderate concentric left ventricular hypertrophy. Left ventricular diastolic parameters are consistent with Grade I diastolic dysfunction (impaired  relaxation).  2. Right ventricular systolic function is normal. The right ventricular size is normal.  3. The mitral valve is normal in structure. Trivial mitral valve regurgitation. No evidence of mitral stenosis.  4. The aortic valve is normal in structure. Aortic valve regurgitation is mild. No aortic stenosis is present.  5. The inferior vena cava is normal in size with greater than 50% respiratory variability, suggesting right atrial pressure of 3 mmHg. FINDINGS  Left Ventricle: Left ventricular ejection fraction, by estimation, is 60 to 65%. The left ventricle has normal function. The left ventricle has no regional wall motion abnormalities. The left ventricular internal cavity size was normal in size. There is  moderate concentric left ventricular hypertrophy. Left ventricular diastolic parameters are consistent with Grade I diastolic dysfunction (impaired relaxation). Right Ventricle: The right ventricular size is normal. No increase in right ventricular wall thickness. Right ventricular systolic function is normal. Left Atrium: Left atrial size was normal in size. Right Atrium: Right atrial size was normal in size. Pericardium: There is no evidence of pericardial effusion. Mitral Valve: The mitral valve is normal in structure. Trivial mitral valve regurgitation. No evidence of mitral valve stenosis. Tricuspid Valve: The tricuspid valve is normal in structure. Tricuspid valve regurgitation is trivial. No evidence of tricuspid stenosis. Aortic Valve: The aortic valve is normal in structure. Aortic valve regurgitation is mild. Aortic regurgitation PHT measures 1577 msec. No aortic stenosis is present. Aortic valve mean gradient measures 5.0 mmHg. Aortic valve peak gradient measures 9.1 mmHg. Aortic valve area, by VTI measures 2.30 cm. Pulmonic Valve: The pulmonic valve was normal in structure. Pulmonic valve regurgitation is not visualized. No evidence of pulmonic stenosis. Aorta: The aortic root is normal in  size and structure. Venous: The inferior vena cava is normal in size with greater than 50% respiratory variability, suggesting right atrial pressure of 3 mmHg. IAS/Shunts: No atrial level shunt detected by color flow Doppler.  LEFT VENTRICLE PLAX 2D LVIDd:         3.84 cm   Diastology LVIDs:         2.17 cm   LV e' medial:    6.39 cm/s LV PW:         1.65 cm   LV E/e' medial:  9.6 LV IVS:        1.18 cm   LV e' lateral:   7.83 cm/s LVOT diam:     1.90 cm   LV E/e' lateral: 7.8 LV SV:         74 LV SV Index:   38 LVOT Area:     2.84 cm  RIGHT VENTRICLE RV Basal diam:  3.15 cm RV S prime:     17.40 cm/s TAPSE (M-mode): 2.4 cm LEFT ATRIUM             Index        RIGHT ATRIUM           Index LA diam:        3.20 cm 1.64 cm/m   RA Area:     18.10 cm LA Vol (A2C):   72.4 ml 37.01 ml/m  RA  Volume:   47.10 ml  24.08 ml/m LA Vol (A4C):   65.8 ml 33.63 ml/m LA Biplane Vol: 69.3 ml 35.42 ml/m  AORTIC VALVE AV Area (Vmax):    2.10 cm AV Area (Vmean):   2.06 cm AV Area (VTI):     2.30 cm AV Vmax:           151.00 cm/s AV Vmean:          100.000 cm/s AV VTI:            0.323 m AV Peak Grad:      9.1 mmHg AV Mean Grad:      5.0 mmHg LVOT Vmax:         112.00 cm/s LVOT Vmean:        72.600 cm/s LVOT VTI:          0.262 m LVOT/AV VTI ratio: 0.81 AI PHT:            1577 msec  AORTA Ao Root diam: 3.20 cm MITRAL VALVE MV Area (PHT): 3.20 cm     SHUNTS MV Decel Time: 237 msec     Systemic VTI:  0.26 m MV E velocity: 61.30 cm/s   Systemic Diam: 1.90 cm MV A velocity: 101.00 cm/s MV E/A ratio:  0.61 Shaukat Khan Electronically signed by Neoma Laming Signature Date/Time: 08/04/2021/8:55:17 AM    Final    MR BRAIN WO CONTRAST  Result Date: 08/03/2021 CLINICAL DATA:  66 year old male with severe intracranial atherosclerosis and questionable medullary ischemia on brain MRI yesterday. EXAM: MRI HEAD WITHOUT CONTRAST TECHNIQUE: Multiplanar, multiecho pulse sequences of the brain and surrounding structures were obtained without  intravenous contrast. COMPARISON:  CTA head and neck this morning.  Brain MRI yesterday. FINDINGS: Brain: More convincing appearance of ventral medullary restricted diffusion, asymmetrically greater to the left of midline. See series 5, image 12, series 7, images 18 and 19. Mild if any associated T2 and FLAIR hyperintensity there. And no other convincing diffusion restriction despite the CTA findings earlier today. 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 Chronic encephalomalacia right inferior frontal gyrus with hemosiderin. Occasional chronic microhemorrhages elsewhere, right cerebellum (series 13, image 19). No new signal abnormality compared to yesterday. No midline shift, mass effect, evidence of mass lesion, ventriculomegaly, extra-axial collection or acute intracranial hemorrhage. Cervicomedullary junction and pituitary are within normal limits. Vascular: Major intracranial vascular flow voids are stable from yesterday. See also CTA today reported separately. Skull and upper cervical spine: Negative. Visualized bone marrow signal is within normal limits. Sinuses/Orbits: Stable, negative. Other: Visible internal auditory structures appear normal. Mastoids are clear. Negative visible scalp and face. IMPRESSION: 1. Positive for ischemia/infarction of the ventral Medulla as suspected yesterday, slightly greater on the left. No associated hemorrhage or mass effect. 2. 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 These results were communicated to Dr. Rory Percy at 11:06 am on 08/03/2021 by text page via the [facility] messaging system. Electronically Signed   By: Genevie Ann M.D.   On: 08/03/2021 11:06   CT ANGIO HEAD NECK W WO CM  Addendum Date: 08/03/2021   ADDENDUM REPORT: 08/03/2021 09:17 ADDENDUM: Study discussed by telephone  with Dr. Amie Portland on 08/03/2021 at 0911 hours. Electronically Signed   By: Genevie Ann M.D.   On: 08/03/2021 09:17   Result Date: 08/03/2021 CLINICAL DATA:  66 year old male with  neurologic deficit. Questionable small vessel ischemia of the medulla on brain MRI yesterday. EXAM: CT ANGIOGRAPHY HEAD AND NECK TECHNIQUE: Multidetector CT imaging of the head and neck was performed using the standard protocol during bolus administration of intravenous contrast. Multiplanar CT image reconstructions and MIPs were obtained to evaluate the vascular anatomy. Carotid stenosis measurements (when applicable) are obtained utilizing NASCET criteria, using the distal internal carotid diameter as the denominator. RADIATION DOSE REDUCTION: This exam was performed according to the departmental dose-optimization program which includes automated exposure control, adjustment of the mA and/or kV according to patient size and/or use of iterative reconstruction technique. CONTRAST:  69m OMNIPAQUE IOHEXOL 350 MG/ML SOLN COMPARISON:  Brain MRI yesterday.  Head CT yesterday. FINDINGS: CT HEAD Brain: Bulky Calcified atherosclerosis at the skull base. Stable CT appearance of the brainstem since yesterday. No acute intracranial hemorrhage identified. No midline shift, mass effect, or evidence of intracranial mass lesion. Chronic right inferior frontal gyrus encephalomalacia, patchy bilateral cerebral white matter hypodensity more pronounced on the left. 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 No ventriculomegaly or acute cortically based infarct. Calvarium and skull base: No acute osseous abnormality identified. Paranasal sinuses: Visualized paranasal sinuses and mastoids are stable and well aerated. Orbits: Visualized orbits and scalp soft tissues are within normal limits. CTA NECK Skeleton: Periapical dental lucency bilaterally. Ordinary cervical spine degeneration. No acute osseous abnormality identified. Upper chest:  Centrilobular and paraseptal emphysema. No superior mediastinal lymphadenopathy. Other neck: 15 mm hypodense right thyroid nodule. Recommend thyroid [place](ref: J Am Coll Radiol. 2015 Feb;12(2): 143-50).Dermal and subcutaneous probable 2.6 cm sebaceous cyst of the left submandibular face (series 13, image 79). Otherwise negative CT appearance of the neck soft tissues. Aortic arch: 3 vessel arch configuration. Mild arch atherosclerosis. Right carotid system: Negative brachiocephalic artery and proximal right CCA. Retropharyngeal course of the right carotid bifurcation with mild calcified plaque. Tortuous right ICA distal to the bulb, no stenosis. Left carotid system: Similar mild tortuosity and atherosclerosis with no hemodynamically significant stenosis. Vertebral arteries: Tortuous proximal right subclavian artery with a mildly kinked appearance in the superior mediastinum. Normal right vertebral artery origin. Patent right vertebral artery to the skull base with no significant plaque or stenosis. Mild to moderate soft and calcified plaque in the proximal left subclavian artery with less than 50 % stenosis with respect to the distal vessel. Mild atherosclerosis and stenosis at the left vertebral artery origin on series 13, image 159. Non dominant left vertebral artery than is patent to the skull base with no additional plaque or stenosis. CTA HEAD Posterior circulation: Bilateral V4 segment calcified plaque. Severe stenosis on the left (series 12, image 139) upstream of the left PICA origin which remains patent. But the left V4 segment is occluded distal to the left PICA. Contralateral right V4 moderate tandem stenoses upstream of the patent right PICA origin on series 12, image 130. And severe stenosis, near occlusion of the right vertebrobasilar junction (series 16, image 26). The basilar artery remains patent but is moderately stenotic in the mid basilar segment on series 16, image 24. Distal basilar remains patent  along with SCA and PCA origins. Left posterior communicating artery is present, the right is diminutive or absent. a mild to moderate stenosis left PCA origin. And bilateral P2 segment severe stenosis, near occlusion on series 15, image 53. But there is preserved distal PCA branch enhancement bilaterally. Anterior circulation: Both ICA siphons are patent. However, on the left there is bulky soft plaque or thrombus in  the supraclinoid ICA (series 14, image 162) this is between normal appearing left ophthalmic and posterior communicating artery origins. Left carotid terminus remains patent. Contralateral right ICA siphon with mild to moderate calcified plaque and mild supraclinoid stenosis. Patent carotid termini, MCA and ACA origins. Tortuous A1 segments. Normal anterior communicating artery. Proximal A2 segments are normal. There is severe right ACA distal A2 segment stenosis, near occlusion on series 17, image 32. Left MCA M1 segment is mildly irregular. Left MCA bifurcation is patent without stenosis. Left MCA branches are mildly irregular. Mild to moderate somewhat long segment right MCA M1 stenosis along 6 mm series 16, image 20. Distal M1 and right MCA trifurcation remain patent. Right MCA branches are mildly irregular. Venous sinuses: Early contrast timing, grossly patent. Anatomic variants: Mildly dominant right vertebral artery. Review of the MIP images confirms the above findings IMPRESSION: 1. 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 - Moderate stenosis Mid Basilar Artery. - Near Occlusion bilateral PCA P2 segments, distal right ACA A2. - bulky soft plaque or thrombus in the Supraclinoid Left ICA, with up to Moderate stenosis. - mild-to-moderate Right MCA M1 stenosis. 2. Stable CT appearance of the brain since yesterday. No new intracranial abnormality. 3. Aortic  Atherosclerosis (ICD10-I70.0) and Emphysema (ICD10-J43.9). Electronically Signed: By: Genevie Ann M.D. On: 08/03/2021 09:01   US Carotid Bilateral (at [facility] and [facility] only)  Result Date: 08/03/2021 CLINICAL DATA:  Follow-up examination for stroke. EXAM: BILATERAL CAROTID DUPLEX ULTRASOUND TECHNIQUE: Pearline Cables scale imaging, color Doppler and duplex ultrasound were performed of bilateral carotid and vertebral arteries in the neck. COMPARISON:  Prior Study from 04/03/2014. FINDINGS: Criteria: Quantification of carotid stenosis is based on velocity parameters that correlate the residual internal carotid diameter with NASCET-based stenosis levels, using the diameter of the distal internal carotid lumen as the denominator for stenosis measurement. The following velocity measurements were obtained: RIGHT ICA: 84/10 cm/sec CCA: 50/53 cm/sec SYSTOLIC ICA/CCA RATIO:  1.1 ECA: 64 cm/sec LEFT ICA: 64/16 cm/sec CCA: 976/73 cm/sec SYSTOLIC ICA/CCA RATIO:  0.9 ECA: 117 cm/sec RIGHT CAROTID ARTERY: Mild smooth intimal thickening seen within the visualized right CCA without significant stenosis. Mild heterogeneous mural based echogenic plaque seen about the right carotid bulb. No significant elevation in peak systolic velocity to suggest hemodynamically significant greater than 50% stenosis. Visualized right ICA patent distally without stenosis. RIGHT VERTEBRAL ARTERY:  Patent with antegrade flow. LEFT CAROTID ARTERY: Diffuse smooth intimal thickening seen within the visualized left CCA without significant stenosis. Mild scattered heterogeneous echogenic plaque about the left carotid bulb/proximal left ICA. No significant elevation in peak systolic velocity to suggest hemodynamically significant greater than 50% stenosis. Visualized left ICA patent distally without visible stenosis. LEFT VERTEBRAL ARTERY:  Patent with antegrade flow. IMPRESSION: 1. Mild atherosclerotic plaque within the carotid bulbs and proximal ICAs bilaterally, but no  hemodynamically significant greater than 50% stenosis. 2. Patent antegrade flow within both vertebral arteries. Electronically Signed   By: Jeannine Boga M.D.   On: 08/03/2021 03:26   MR Cervical Spine Wo Contrast  Result Date: 08/03/2021 CLINICAL DATA:  Initial evaluation for ataxia, right-sided numbness. EXAM: MRI CERVICAL SPINE WITHOUT CONTRAST TECHNIQUE: Multiplanar, multisequence MR imaging of the cervical spine was performed. No intravenous contrast was administered. COMPARISON:  None available. FINDINGS: Alignment: Examination degraded by motion artifact. Straightening with mild reversal of the normal cervical lordosis. No listhesis. Vertebrae: Vertebral body height maintained without acute  or chronic fracture. Bone marrow signal intensity within normal limits. No discrete or worrisome osseous lesions or abnormal marrow edema. Cord: Normal signal and morphology. No convincing cord signal changes on this motion degraded exam. Posterior Fossa, vertebral arteries, paraspinal tissues: Unremarkable. Disc levels: C2-C3: Unremarkable. C3-C4: Mild disc bulge with endplate and uncovertebral spurring. Flattening and partial effacement of the ventral thecal sac with resultant mild spinal stenosis. Moderate bilateral C4 foraminal narrowing. C4-C5: Mild disc bulge with uncovertebral spurring. Mild flattening of the ventral thecal sac without significant spinal stenosis. Moderate right with mild left C5 foraminal stenosis. C5-C6: Minimal disc bulge with uncovertebral spurring. No spinal stenosis. Mild right C6 foraminal narrowing. Left neural foramen remains patent. C6-C7: Minimal annular disc bulge. No spinal stenosis. Foramina remain patent. C7-T1:  Unremarkable. Visualized upper thoracic spine demonstrates no significant finding. IMPRESSION: 1. Motion degraded exam. 2. No acute abnormality within the cervical spine. 3. Mild multilevel cervical spondylosis with resultant mild spinal stenosis at C3-4.  Associated moderate bilateral C4 foraminal stenosis, with mild right C5 and C6 foraminal narrowing. Electronically Signed   By: Jeannine Boga M.D.   On: 08/03/2021 01:04   MR BRAIN WO CONTRAST  Result Date: 08/03/2021 CLINICAL DATA:  Initial evaluation for neuro deficit, stroke suspected. EXAM: MRI HEAD WITHOUT CONTRAST TECHNIQUE: Multiplanar, multiecho pulse sequences of the brain and surrounding structures were obtained without intravenous contrast. COMPARISON:  Prior CT from earlier the same day as well as previous MRI from 09/17/2015. FINDINGS: Brain: Generalized age-related cerebral atrophy. Patchy T2/FLAIR hyperintensity involving the periventricular and deep white matter both cerebral hemispheres as well as the pons, most consistent with chronic small vessel ischemic disease, moderately advanced in nature. Area of encephalomalacia of involving parasagittal anterior/inferior right frontal lobe noted at site of prior hemorrhage. Additional smaller foci of encephalomalacia involving the inferior temporal poles bilaterally likely posttraumatic in nature. Few scattered small remote left cerebellar infarcts noted. Subtle area of diffusion signal abnormality involving the ventral medulla measuring up to approximately 8 mm is seen (a series 5, image 12). While this finding could potentially be artifactual, possibly due to prominent vascular calcifications about the adjacent vertebral arteries, a possible small acute ischemic infarct could be considered. No associated hemorrhage or mass effect. No other evidence for acute or subacute ischemia. No acute intracranial hemorrhage. Single chronic microhemorrhage noted within the right cerebellum, of doubtful significance in isolation. No mass lesion, midline shift or mass effect. No hydrocephalus or extra-axial fluid collection. Pituitary gland and suprasellar region within normal limits. Vascular: Major intracranial vascular flow voids are maintained. Skull and  upper cervical spine: Craniocervical junction within normal limits. Bone marrow signal intensity normal. No scalp soft tissue abnormality. Sinuses/Orbits: Globes and orbital soft tissues within normal limits. Paranasal sinuses are largely clear. No mastoid effusion. Other: None. IMPRESSION: 1. 8 mm focus of diffusion signal abnormality involving the ventral medulla, indeterminate. While this finding could potentially be artifactual in nature, a possible small acute ischemic infarct is difficult to exclude, and could be considered in the correct clinical setting. No associated hemorrhage or mass effect. 2. No other acute intracranial abnormality. 3. Underlying atrophy with moderate chronic small vessel ischemic disease with a few small remote left cerebellar infarcts. Area of encephalomalacia of involving the anterior/inferior right frontal lobe at site of previous hemorrhage. Electronically Signed   By: Jeannine Boga M.D.   On: 08/03/2021 00:59   CT Head Wo Contrast  Result Date: 08/02/2021 CLINICAL DATA:  New right upper extremity tremor. Tingling to  the arm. EXAM: CT HEAD WITHOUT CONTRAST TECHNIQUE: Contiguous axial images were obtained from the base of the skull through the vertex without intravenous contrast. RADIATION DOSE REDUCTION: This exam was performed according to the departmental dose-optimization program which includes automated exposure control, adjustment of the mA and/or kV according to patient size and/or use of iterative reconstruction technique. COMPARISON:  MRI brain 09/17/2015 FINDINGS: Brain: Mild cerebral atrophy. Mild ventricular dilatation consistent with central atrophy. Patchy low-attenuation changes in the deep white matter consistent small vessel ischemic change. Old area of encephalomalacia in the right anterior frontal lobe corresponding to previous intraparenchymal hemorrhage on prior MRI. No acute mass effect or midline shift. No abnormal extra-axial fluid collections.  Gray-white matter junctions are distinct. Basal cisterns are not effaced. No acute intracranial hemorrhage. Vascular: No hyperdense vessel or unexpected calcification. Skull: Normal. Negative for fracture or focal lesion. Sinuses/Orbits: Paranasal sinuses and mastoid air cells are clear. Other: None. IMPRESSION: No acute intracranial abnormalities. Chronic atrophy and small vessel ischemic changes. 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 Electronically Signed   By: Lucienne Capers M.D.   On: 08/02/2021 21:20   DG Foot Complete Right  Result Date: 08/02/2021 CLINICAL DATA:  Chronic RIGHT arm and leg pain for since Sunday, RIGHT leg cold to touch, swelling RIGHT great toe EXAM: RIGHT FOOT COMPLETE - 3+ VIEW COMPARISON:  None Available. FINDINGS: Osseous mineralization normal. 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 Remaining joint spaces preserved. No acute fracture, dislocation, or bone destruction. Small plantar calcaneal spur. IMPRESSION: Degenerative changes first MTP joint and small plantar calcaneal spur. No acute osseous abnormalities. Electronically Signed   By: Lavonia Dana M.D.   On: 08/02/2021 20:51     PHYSICAL EXAM  Temp:  [97.6 F (36.4 C)-99.6 F (37.6 C)] 99.6 F (37.6 C) (07/28 1200) Pulse Rate:  [61-105] 74 (07/28 1200) Resp:  [14-30] 17 (07/28 1200) BP: (91-187)/(61-101) 180/85 (07/28 1200) SpO2:  [97 %-100 %] 100 % (07/28 1200) Arterial Line BP: (113-206)/(53-87) 132/87 (07/28 1200) FiO2 (%):  [40 %] 40 % (07/28 1151)  General - Well nourished, well developed, intubated on Precedex in the ICU. Respiratory- Oral ETT in place, midline. Moderate amount of clear secretions present with inline suctioning. Cough present with suctioning.  Cardiovascular - Regular rate and rhythm.  Neuro - intubated on Precedex, eyes open, following midline commands, eyes in mid position but will track throughout visual fields, blinking to visual threat,  PERRL. Corneal reflex present, gag and cough present. Breathing over the set ventilator rate.  Bilateral nystagmus present, worse in rightward gaze. Communicates with blinks. One blink indicates "yes" answer and two blinks indicates a "no" answer. Facial symmetry not able to test due to ET tube.  On pain stimulation, no movement in all extremities. No babinski. Patient endorses sensation to light touch intact and symmetric throughout extremities. Patient is unable to participate in FNF and HKS.  ASSESSMENT/PLAN Mr. Joshua Donovan is a 66 y.o. male with history of remote TBI with ICH, hypertension, hyperlipidemia, diabetes admitted for right leg/foot weakness and pain, right hand numbness, imbalance.  Gradually developed left leg weakness also during admission.  No tPA given due to outside window.  Quadriplegia on examination, attempt at extubation failed with immediate reintubation. Plan for early intervention with trach/PEG today 7/28.   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  Ongoing aggressive stroke risk factor management Therapy recommendations: SNF vs. 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 Grossly patent flow through the stent, although evaluation for possible intra stent stenosis is limited by MRA  Respiratory failure Reintubated right after extubation, plan for trach and PEG this afternoon 7/28 On ventilation CCM on board On Precedex  Diabetes HgbA1c 8.3 goal < 7.0 Controlled CBG monitoring SSI DM education and close PCP follow up  Hypertension Unstable BP goal < 180/105 Intermittently on Cleviprex; labile blood pressures with inline suctioning and stimulation Long term BP goal normotensive  Hyperlipidemia Home meds: Lipitor 20 LDL 116, goal < 70 Now on Lipitor 80 Continue statin at discharge  Other Stroke Risk Factors Advanced age Former cigarette smoker quit smoking 33 years ago ETOH use, 1 drink per day  Other Active Problems Remote TBI with traumatic [facility] day # 3  -- Anibal Henderson, AGACNP-BC [facility] [phone]  ATTENDING NOTE: I reviewed above note and agree with the assessment and plan. Pt was seen and examined.   Caregiver at bedside.  Patient still intubated, follows commands with eye movement and eye opening or close.  However not able to move neck, not able to turn neck from side to side.  Answer question appropriately with eyelid movement.  Still has quadriplegia and decreased muscle tone in all extremities.  Discussed with Dr. Tacy Learn CCM, will proceed with tracheostomy and PEG tube placement.  For detailed assessment and plan, please refer to above/below as I have made changes wherever appropriate.   Rosalin Hawking, MD PhD Stroke Neurology 08/06/2021 10:55 PM  This patient is critically ill due to brainstem stroke with now quadriplegic, respite failure and at significant risk of neurological worsening, death form 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 This patient's care requires constant  monitoring of vital signs, hemodynamics, respiratory and cardiac monitoring, review of multiple databases, neurological assessment, discussion with family, other specialists and medical decision making of high complexity. I spent 35 minutes of neurocritical care time in the care of this patient. I had long discussion with caregiver at bedside and daughter over the phone, updated pt current condition, treatment plan and potential prognosis, and answered all the questions. They expressed understanding and appreciation. I also discussed with Dr. Tacy Learn.   To contact Stroke Continuity provider, please refer to [email or web address] After hours, contact General Neurology

## 2021-08-07 DIAGNOSIS — I639 Cerebral infarction, unspecified: Secondary | ICD-10-CM | POA: Diagnosis not present

## 2021-08-07 LAB — GLUCOSE, CAPILLARY
Glucose-Capillary: 224 mg/dL — ABNORMAL HIGH (ref 70–99)
Glucose-Capillary: 233 mg/dL — ABNORMAL HIGH (ref 70–99)
Glucose-Capillary: 238 mg/dL — ABNORMAL HIGH (ref 70–99)
Glucose-Capillary: 240 mg/dL — ABNORMAL HIGH (ref 70–99)
Glucose-Capillary: 241 mg/dL — ABNORMAL HIGH (ref 70–99)
Glucose-Capillary: 297 mg/dL — ABNORMAL HIGH (ref 70–99)

## 2021-08-07 LAB — BASIC METABOLIC PANEL
Anion gap: 9 (ref 5–15)
BUN: 12 mg/dL (ref 8–23)
CO2: 26 mmol/L (ref 22–32)
Calcium: 8.7 mg/dL — ABNORMAL LOW (ref 8.9–10.3)
Chloride: 104 mmol/L (ref 98–111)
Creatinine, Ser: 0.72 mg/dL (ref 0.61–1.24)
GFR, Estimated: >60 mL/min (ref >60)
Glucose, Bld: 290 mg/dL — ABNORMAL HIGH (ref 70–99)
Potassium: 3.5 mmol/L (ref 3.5–5.1)
Sodium: 139 mmol/L (ref 135–145)

## 2021-08-07 LAB — CBC
HCT: 40.4 % (ref 39.0–52.0)
Hemoglobin: 13.2 g/dL (ref 13.0–17.0)
MCH: 29.1 pg (ref 26.0–34.0)
MCHC: 32.7 g/dL (ref 30.0–36.0)
MCV: 89.2 fL (ref 80.0–100.0)
Platelets: 291 10*3/uL (ref 150–400)
RBC: 4.53 MIL/uL (ref 4.22–5.81)
RDW: 14.5 % (ref 11.5–15.5)
WBC: 8.9 10*3/uL (ref 4.0–10.5)
nRBC: 0 % (ref 0.0–0.2)

## 2021-08-07 LAB — MAGNESIUM: Magnesium: 2 mg/dL (ref 1.7–2.4)

## 2021-08-07 LAB — APTT: aPTT: 31 seconds (ref 24–36)

## 2021-08-07 LAB — PHOSPHORUS: Phosphorus: 3.2 mg/dL (ref 2.5–4.6)

## 2021-08-07 MED ORDER — THIAMINE HCL 100 MG PO TABS
100.0000 mg | ORAL_TABLET | Freq: Every day | ORAL | Status: DC
  Administered 2021-08-07 – 2021-11-26 (×112): 100 mg
  Filled 2021-08-07 (×112): qty 1

## 2021-08-07 MED ORDER — LISINOPRIL 20 MG PO TABS
40.0000 mg | ORAL_TABLET | Freq: Every day | ORAL | Status: DC
  Administered 2021-08-07: 40 mg
  Filled 2021-08-07 (×2): qty 2

## 2021-08-07 MED ORDER — AMLODIPINE BESYLATE 5 MG PO TABS
5.0000 mg | ORAL_TABLET | Freq: Every day | ORAL | Status: DC
  Administered 2021-08-07: 5 mg
  Filled 2021-08-07: qty 1

## 2021-08-07 MED ORDER — LABETALOL HCL 5 MG/ML IV SOLN
10.0000 mg | INTRAVENOUS | Status: DC | PRN
  Administered 2021-08-07 – 2021-08-13 (×7): 20 mg via INTRAVENOUS
  Filled 2021-08-07 (×8): qty 4

## 2021-08-07 MED ORDER — INSULIN ASPART 100 UNIT/ML IJ SOLN
0.0000 [IU] | INTRAMUSCULAR | Status: DC
  Administered 2021-08-07 – 2021-08-08 (×4): 7 [IU] via SUBCUTANEOUS
  Administered 2021-08-08 (×2): 11 [IU] via SUBCUTANEOUS
  Administered 2021-08-08: 7 [IU] via SUBCUTANEOUS
  Administered 2021-08-08: 15 [IU] via SUBCUTANEOUS
  Administered 2021-08-08: 7 [IU] via SUBCUTANEOUS
  Administered 2021-08-08: 11 [IU] via SUBCUTANEOUS
  Administered 2021-08-09: 7 [IU] via SUBCUTANEOUS
  Administered 2021-08-09: 4 [IU] via SUBCUTANEOUS
  Administered 2021-08-09 (×2): 7 [IU] via SUBCUTANEOUS
  Administered 2021-08-09: 4 [IU] via SUBCUTANEOUS
  Administered 2021-08-10: 7 [IU] via SUBCUTANEOUS
  Administered 2021-08-10: 4 [IU] via SUBCUTANEOUS
  Administered 2021-08-10: 3 [IU] via SUBCUTANEOUS
  Administered 2021-08-10 – 2021-08-11 (×4): 7 [IU] via SUBCUTANEOUS
  Administered 2021-08-11: 4 [IU] via SUBCUTANEOUS
  Administered 2021-08-11: 7 [IU] via SUBCUTANEOUS
  Administered 2021-08-12: 11 [IU] via SUBCUTANEOUS
  Administered 2021-08-12: 7 [IU] via SUBCUTANEOUS
  Administered 2021-08-12: 3 [IU] via SUBCUTANEOUS
  Administered 2021-08-12 (×2): 7 [IU] via SUBCUTANEOUS
  Administered 2021-08-12: 11 [IU] via SUBCUTANEOUS
  Administered 2021-08-12: 4 [IU] via SUBCUTANEOUS
  Administered 2021-08-13: 3 [IU] via SUBCUTANEOUS
  Administered 2021-08-13: 7 [IU] via SUBCUTANEOUS

## 2021-08-07 MED ORDER — METOPROLOL TARTRATE 25 MG PO TABS
12.5000 mg | ORAL_TABLET | Freq: Two times a day (BID) | ORAL | Status: DC
  Administered 2021-08-07 (×2): 12.5 mg
  Filled 2021-08-07 (×2): qty 1

## 2021-08-07 MED ORDER — INSULIN DETEMIR 100 UNIT/ML ~~LOC~~ SOLN
8.0000 [IU] | Freq: Two times a day (BID) | SUBCUTANEOUS | Status: DC
  Administered 2021-08-07 (×2): 8 [IU] via SUBCUTANEOUS
  Filled 2021-08-07 (×4): qty 0.08

## 2021-08-07 MED ORDER — BISACODYL 10 MG RE SUPP
10.0000 mg | Freq: Every day | RECTAL | Status: DC | PRN
  Administered 2021-09-13 – 2021-10-03 (×4): 10 mg via RECTAL
  Filled 2021-08-07 (×4): qty 1

## 2021-08-07 MED ORDER — SENNA 8.6 MG PO TABS
1.0000 | ORAL_TABLET | Freq: Once | ORAL | Status: AC
  Administered 2021-08-07: 8.6 mg
  Filled 2021-08-07: qty 1

## 2021-08-07 MED ORDER — SENNOSIDES-DOCUSATE SODIUM 8.6-50 MG PO TABS: 1.0000 | ORAL_TABLET | Freq: Two times a day (BID) | ORAL | Status: DC

## 2021-08-07 MED ORDER — CHLORHEXIDINE GLUCONATE CLOTH 2 % EX PADS
6.0000 | MEDICATED_PAD | Freq: Every day | CUTANEOUS | Status: DC
  Administered 2021-08-08 – 2021-08-25 (×18): 6 via TOPICAL

## 2021-08-07 MED ORDER — POTASSIUM CHLORIDE 20 MEQ PO PACK
40.0000 meq | PACK | Freq: Once | ORAL | Status: AC
  Administered 2021-08-07: 40 meq
  Filled 2021-08-07: qty 2

## 2021-08-07 MED ORDER — DEXMEDETOMIDINE HCL IN NACL 400 MCG/100ML IV SOLN
0.0000 ug/kg/h | INTRAVENOUS | Status: DC
  Administered 2021-08-07: 0.6 ug/kg/h via INTRAVENOUS
  Administered 2021-08-08: 0.4 ug/kg/h via INTRAVENOUS
  Filled 2021-08-07 (×2): qty 100

## 2021-08-07 NOTE — Progress Notes (Signed)
Physical Therapy Treatment Patient Details Name: Joshua Donovan MRN: [id number] DOB: [birth year] Today's Date: 08/07/2021   History of Present Illness Pt is a 65 y.o. male who presented 08/02/21 with R foot pain and R-sided weakness. Transferred to [facility] 7/25. 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 S/p stent placement to the R vertebrobasilar junction 7/26, failed extubation. Trach and PEG placed 7/28. PMH: DM, GERD, TBI with prior ICH, HLD, HTN    PT Comments    Pt with no attempt at verbalizations this morning, but is able to blink consistently for yes/no questions. No active movement noted in any extremity or core muscle groups, but the pt is able to control facial muscles at this time. Reports sensation equal bilaterally between RLE and LLE as well as equal bilaterally between RUE and LUE, but diminished in extremities compared to facial sensation. Following commands related to eye position or facial muscles briskly, no other active movement noted in session. The pt intermittently needs repeated instruction to use single blink for no and double blink for yes, possible STM deficits. Pt family (youngest daughter) educated on PROM to extremities with all questions answered. Will continue to follow and progress as tolerated.     Recommendations for follow up therapy are one component of a multi-disciplinary discharge planning process, led by the attending physician.  Recommendations may be updated based on patient status, additional functional criteria and insurance authorization.  Follow Up Recommendations  Skilled nursing-short term  rehab (<3 hours/day) (vs LTACH) Can patient physically be transported by private vehicle: No   Assistance Recommended at Discharge Frequent or constant Supervision/Assistance  Patient can return home with the following Assistance with cooking/housework;Two people to help with walking and/or transfers;Two people to help with bathing/dressing/bathroom;Assistance with feeding;Direct supervision/assist for medications management;Direct supervision/assist for financial management;Assist for transportation;Help with stairs or ramp for entrance   Equipment Recommendations  Other (comment) (TBD)    Recommendations for Other Services       Precautions / Restrictions Precautions Precautions: Fall;Other (comment) Precaution Comments: trach vent, peg, SBP < 180, prevalon boots Restrictions Weight Bearing Restrictions: No     Mobility  Bed Mobility Overal bed mobility: Needs Assistance             General bed mobility comments: totalA to reposition in bed, pt unable to maintain head position without support, no active movement at any muscle other than facial muscles. used chair position in bed for safety with pt tolerating well    Transfers                   General transfer comment: deferred due to pt flaccidity    Ambulation/Gait               General Gait Details: deferred due to pt flaccidity   Modified Rankin (Stroke Patients Only) Modified Rankin (Stroke Patients Only) Pre-Morbid Rankin Score: No symptoms Modified Rankin: Severe disability     Balance Overall balance assessment: Needs assistance Sitting-balance support: Single extremity supported, No upper extremity supported, Feet supported Sitting balance-Leahy Scale: Zero Sitting balance - Comments: totalA to sit with assist at trunk and head to maintain midline.  Standing balance comment: deferred due to pt flaccidity                            Cognition Arousal/Alertness:  Awake/alert Behavior During Therapy: Flat affect (crying at one point) Overall Cognitive Status: Impaired/Different from baseline Area of Impairment: Orientation, Memory, Following commands                 Orientation Level: Person, Place, Time (pt able to blink yes/no correctly to place, year, month, and self)   Memory: Decreased short-term memory Following Commands: Follows one step commands consistently, Follows one step commands with increased time       General Comments: pt with no attempt at verbalizations, grunting, or making sounds. Able to blink consistently, but at times needs repeated cues to use 1 blink for no and 2 for yes, possibly imparied STM.        Exercises General Exercises - Upper Extremity Shoulder Flexion: PROM, Both, 10 reps, Seated Shoulder ABduction: PROM, Both, 10 reps, Seated Elbow Flexion: PROM, Both, 10 reps, Seated Elbow Extension: PROM, Both, 10 reps, Seated Wrist Flexion: PROM, Both, 10 reps, Seated Wrist Extension: PROM, Both, 10 reps, Seated Digit Composite Flexion: PROM, Both, 10 reps, Seated Composite Extension: PROM, Both, 10 reps, Seated General Exercises - Lower Extremity Ankle Circles/Pumps: PROM, Both, 10 reps, Seated Short Arc Quad: PROM, Both, 10 reps, Seated Heel Slides: PROM, Both, 10 reps, Seated Hip ABduction/ADduction: PROM, Right, 10 reps, Seated    General Comments General comments (skin integrity, edema, etc.): VSS on 40% fiO2 with PEEP of 5 on trach vent. daughter present and educated on PROM      Pertinent Vitals/Pain Pain Assessment Pain Assessment: Faces Faces Pain Scale: Hurts little more Pain Location: trach site Pain Descriptors / Indicators: Discomfort Pain Intervention(s): Limited activity within patient's tolerance, Monitored during session, Repositioned, Patient requesting pain meds-RN notified     PT Goals (current goals can now be found in the care plan section) Acute Rehab PT Goals Patient Stated  Goal: to improve per friend PT Goal Formulation: With family Time For Goal Achievement: 08/19/21 Potential to Achieve Goals: Fair Progress towards PT goals: Progressing toward goals    Frequency    Min 3X/week      PT Plan Current plan remains appropriate       AM-PAC PT "6 Clicks" Mobility   Outcome Measure  Help needed turning from your back to your side while in a flat bed without using bedrails?: Total Help needed moving from lying on your back to sitting on the side of a flat bed without using bedrails?: Total Help needed moving to and from a bed to a chair (including a wheelchair)?: Total Help needed standing up from a chair using your arms (e.g., wheelchair or bedside chair)?: Total Help needed to walk in hospital room?: Total Help needed climbing 3-5 steps with a railing? : Total 6 Click Score: 6    End of Session Equipment Utilized During Treatment: Oxygen (vent) Activity Tolerance: Patient tolerated treatment well Patient left: in bed;with call bell/phone within reach;with bed alarm set;with family/visitor present;with nursing/sitter in room Nurse Communication: Mobility status PT Visit Diagnosis: Muscle weakness (generalized) (M62.81);Difficulty in walking, not elsewhere classified (R26.2);Other symptoms and signs involving the nervous system (R29.898);Unsteadiness on feet (R26.81)     Time: 1610-9604 PT Time Calculation (min) (ACUTE ONLY): 40 min  Charges:  $Therapeutic Exercise: 23-37 mins $Therapeutic Activity: 8-22 mins  [name], PT, DPT   Acute Rehabilitation Department   [name] 08/07/2021, 9:39 AM

## 2021-08-07 NOTE — Progress Notes (Signed)
Referring Physician(s): Rosalin Hawking  Supervising Physician: Luanne Bras  Patient Status:  [facility] Donovan - In-pt  Chief Complaint: Follow up right vertebrobasilar junction stenting 08/04/21 in NIR  Subjective:  Patient s/p trach/PEG yesterday, per PT not moving extremities. Per daughter not following commands but able to blink once for no and twice for yes with mostly appropriate yes/no answers to questions she's asked.   Allergies: Anchovies [fish allergy] and Sulfa antibiotics  Medications: Prior to Admission medications   Medication Sig Start Date End Date Taking? Authorizing Provider  atorvastatin (LIPITOR) 20 MG tablet Take 20 mg by mouth daily. 08/02/21  Yes [provider]  cetirizine (ZYRTEC) 10 MG tablet Take 1 tablet (10 mg total) by mouth daily. 02/20/20  Yes Lavera Guise, MD  hydrochlorothiazide (MICROZIDE) 12.5 MG capsule Take 12.5 mg by mouth daily. 10/20/18  Yes [provider]  lisinopril (ZESTRIL) 40 MG tablet Take 40 mg by mouth daily.   Yes [provider]  metFORMIN (GLUCOPHAGE) 1000 MG tablet Take 1,000 mg by mouth 2 (two) times daily with a meal.   Yes [provider]  ACCU-CHEK FASTCLIX LANCETS MISC Use as directed twice daily dia E11.65 08/03/17   Lavera Guise, MD  atorvastatin (LIPITOR) 80 MG tablet Take 1 tablet (80 mg total) by mouth daily. Patient not taking: Reported on 08/05/2021 08/04/21   Gwynne Edinger, MD  Blood Glucose Monitoring Suppl (ACCU-CHEK AVIVA PLUS) w/Device KIT Use as directed 10/04/17   Ronnell Freshwater, NP  glucose blood (ACCU-CHEK AVIVA PLUS) test strip Use two times daily to check blood sugar 10/05/17   Ronnell Freshwater, NP  metoprolol tartrate (LOPRESSOR) 25 MG tablet Take 1 tablet (25 mg total) by mouth daily. Patient not taking: Reported on 08/03/2021 02/20/20   Lavera Guise, MD     Vital Signs: BP (!) 143/81   Pulse 76   Temp 99.7 F (37.6 C) (Oral)   Resp (!) 26   Ht 5' 7" (1.702 m)   Wt  196 lb 6.9 oz (89.1 kg)   SpO2 100%   BMI 30.77 kg/m   Physical Exam Vitals and nursing note reviewed.  Cardiovascular:     Rate and Rhythm: Normal rate.  Pulmonary:     Comments: Trach/vent Skin:    General: Skin is warm and dry.  Neurological:     Mental Status: He is alert.   Alert, awake - does not follow commands consistently or track examiner.  PERRL bilaterally EOMs unable to assess Visual fields unable to assess No obvious facial asymmetry. Tongue midline unable to assess Motor power - no movement in any extremity, able to move facial muscles per PT who is at bedside.   Imaging: DG Chest Port 1 View  Result Date: 08/06/2021 CLINICAL DATA:  Status post tracheostomy EXAM: PORTABLE CHEST 1 VIEW COMPARISON:  None Available. FINDINGS: Tracheostomy tube with tip in the proximal trachea in appropriate position. New mild LEFT basilar atelectasis. Mild central venous congestion. IMPRESSION: Tracheostomy tube in good position. New LEFT basilar atelectasis. Electronically Signed   By: Suzy Bouchard M.D.   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 He presented with progressively worsening weakness in the right lower extremity, left lower extremity, right upper extremity, and left upper  extremity. On the morning of the procedure, patient was flaccid at the right-sided extremities and had severe weakness in the left-sided extremities (grade 1/5). 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 There was occlusion of the left vertebral artery seen distal to the origin of the  PICA. 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 EXAM: 1.  Biplane right carotid and cerebral DSA angiogram. 2.  Biplane left common carotid and cerebral DSA cerebral angiogram 3. Indirect left vertebral and cerebral angiogram with tip of the catheter at the origin of the left vertebral artery 4.  Right subclavian arteriogram. 5. Selective catheterization of the right vertebral artery, biplane vertebral and cerebral DSA cerebral angiogram. 6. Selective catheterization of the right vertebral artery by using the 088 neuron max guide catheter, 5 French AXS catalyst catheter as an intermediate catheter and 021 phenom microcatheter with a 014 Arostotle microwire. 7. High magnification biplane cerebral angiogram and exchange of the microcatheter over an exchange length 014 zoom wire into an Apex 2.25 x 15 mm monorail balloon catheter and angioplasty of the severely 90% stenotic right vertebrobasilar junction. 8. Resolute onyx 3 mm x 22 mm stent placement at the site of the stenosis. 9. Post stent biplane DSA cerebral angiography of the right vertebral artery injection. 10. Right common femoral arteriogram in the RAO projection after withdrawing the sheath with its tip in the external iliac artery and arteriogram Attending: Dr. Frazier Richards Assistant: Dr. Luanne Bras COMPARISON:  CORRELATION IS MADE WITH CTA OF THE HEAD AND NECK DATED August 03, 2021 MEDICATIONS: The antibiotic was administered within 1 hour of the procedure Injection Ancef 2 g Injection Integrilin 4.5 mg at 1.5 mg increments intra-arterially through the right vertebral artery Heparin 3500 units ANESTHESIA/SEDATION: Anesthesia was provided by the anesthesia team. Please refer to the anesthesia notes for detailed information. CONTRAST:  126 mL of Omnipaque 300 FLUOROSCOPY: Fluoroscopy Time: 48 minutes 48 seconds (3499 mGy). COMPLICATIONS: None immediate. TECHNIQUE: Informed written consent was  obtained from the patient and his daughter after a thorough discussion of the procedural risks including but not limited to approximately 5% bleeding, hematoma, vascular injury, worsening of the stroke, ventilator dependency etc. Benefits and alternatives. All questions were addressed. Maximal Sterile Barrier Technique was utilized including caps, mask, sterile gowns, sterile gloves, sterile drape, hand hygiene and skin antiseptic. A timeout was performed prior to the initiation of the procedure. PROCEDURE: The right groin was prepped and draped in the usual sterile fashion. Thereafter using modified Seldinger technique, transfemoral access into the right common femoral artery was obtained without difficulty. Over a 0.035 inch guidewire, an 8 French x 25 cm pinnacle sheath was inserted. Through this, and also over 0.035 inch roadrunner wire, a 5 [place] JB 1 catheter was advanced to the aortic arch region and selectively positioned in the right common carotid artery, and biplane DSA carotid and cerebral angiogram was obtained. Next, with the catheter tip in the left common carotid artery, biplane DSA carotid and cerebral Angiography of the head was obtained. The, selective catheterization of the left subclavian artery followed by angiogram was performed. With the tip of the catheter at the origin of the left vertebral artery, indirect left vertebral and biplane DSA cerebral angiography of left subclavian arterial injection was performed. Next, selective catheterization of the right subclavian artery  was performed and the catheter was advanced into the proximal subclavian artery. Next, the wire was exchanged for an exchange length 035 Rosen wire. The JB 1 catheter was removed and 088 neuron max guide catheter and 5 French AXS catalyst catheter were advanced and selective catheterization of the left vertebral artery followed by vertebral and cerebral angiogram was performed. Next, under high magnification angiography,  and under roadmap guidance, phenom 21 microcatheter and 0 1 for restarting microwire were advanced into the left posterior cerebral artery and angiogram was performed. Next, the wire was exchanged for a 014 zoom exchange length wire. Subsequently, we selected 2.25 x 15 mm Apex monorail microcatheter and under roadmap guidance, angioplasty of the severely stenotic right vertebrobasilar artery was performed. Post angioplasty angiogram was performed. Next, the balloon was removed. Measurements were made. Next we selected 3 mm x 22 mm resolute onyx stent and the stent was deployed at the right vertebrobasilar stenotic site. The stent was gently dilated and was inflated up to 10 atmospheric pressure. Next, post stent biplane DSA cerebral angiogram was performed. At this point in time, we felt the goal of the procedure was obtained. The catheter was removed. Femoral access site was further sterilely cleaned and the femoral sheath was withdrawn with its tip in the right external iliac artery, femoral angiogram was performed in the RAO projection and the femoral access site was closed by using an 8 [place] Angio-Seal. Patient tolerated the procedure well and was transferred to the ICU intubated in stable condition. No immediate complications. FINDINGS: Right CCA: The right common carotid arteriogram demonstrates the right external carotid artery and its major branches to be widely patent. Right ICA: The right internal carotid artery at the bulb to the cranial skull base opacifies normally. No significant stenosis. Right Intracranial: The petrous and cavernous segments are widely patent. Mild 20-30% stenosis at the supraclinoid ICA. The right middle cerebral artery and the right anterior cerebral artery opacify normally into the capillary and venous phases. Mild-to-moderate atheromatous disease with about 30% stenosis in the right M1 segment. There is severe about 70% stenosis of the right pericallosal branch of the ACA seen  in the proximal aspect. There is cross filling via the anterior communicating artery seen with opacification of the left anterior cerebral artery. PCOM is patent with faint opacification of the right posterior cerebral artery. Right Sided Anterior Venous: The venous phase demonstrates preferential egress of contrast into the right transverse sinus, the sigmoid sinus and the right internal jugular vein. There is no early venous shunting into the venous structures at the skull base or intracranially into the dural sinuses. Left CCA: The left common carotid arteriogram demonstrates the left external carotid artery and its major branches to be widely patent. Left ICA: The left internal carotid artery at the bulb to the cranial skull base opacifies normally. Mild atheromatous disease at the origin of the ICA. There is mild stenosis seen about 2 cm from the origin of the left ICA measuring 42 percent by the NASCET criteria. Left Intracranial: The petrous, cavernous and supraclinoid segments are widely patent. The left middle cerebral artery and the left anterior cerebral artery opacify normally into the capillary and venous phases. There is a prominent PCOM opacifying the bilateral posterior cerebral arteries via the distal basilar artery. Left Sided Anterior Venous: The venous phase demonstrates preferential egress of contrast into the right transverse sinus, the sigmoid sinus and the right internal jugular vein. There is opacification of the left transverse sigmoid sinuses and  internal jugular veins seen as well. There is no early venous shunting into the venous structures at the skull base or intracranially into the dural sinuses. Indirect left vertebral and cerebral angiogram with injection of the left subclavian artery demonstrate faint opacification of the left vertebral artery. 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 High magnification biplane DSA cerebral angiography of right  vertebral artery injection demonstrate severe about 90% stenosis at the right vertebrobasilar junction in length of about 7 mm. Post angioplasty angiogram demonstrate significant residual stenoses. Post stent angiogram demonstrate complete expansion of the stent with no residual stenosis. There is about 40-50% stenosis seen at the mid basilar artery. Right PICA, bilateral superior cerebellar, AICA and posterior cerebral arteries show moderate atheromatous disease. No significant stenosis. IMPRESSION: 1. Right vertebral and cerebral angiogram demonstrated about 90% stenosis at the vertebrobasilar junction. Successful angioplasty and stent placement without significant residual stenosis. 2.  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 3. 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 4. Mild atheromatous disease in the right intracranial ICA with 20-30% stenosis at the supraclinoid ICA and 30% stenosis in the right M1 segment. There is about 70% stenoses of the right pericallosal branch of the ACA seen in the A3 segment. 5.  Bilateral P comms are patent with the left PCOM being prominent. PLAN: 1.  Blood pressure goals between 120-140 mm Hg. 2.  Patient to be on dual antiplatelet agents for 6 months. Electronically Signed   By: Frazier Richards M.D.   On: 08/05/2021 14:38   MR BRAIN WO CONTRAST  Result Date: 08/05/2021 CLINICAL DATA:  66 year old male with history of known medullary infarct and severe vertebrobasilar disease, status post catheter directed revascularization and stenting. EXAM: MRI HEAD WITHOUT CONTRAST MRA HEAD WITHOUT CONTRAST TECHNIQUE: Multiplanar, multi-echo pulse sequences of the brain and surrounding structures were acquired without intravenous contrast. Angiographic images of the Circle of Willis were acquired using MRA technique without intravenous contrast. COMPARISON:  Comparison made with arteriogram from earlier the same day as well as multiple previous  studies. FINDINGS: MRI HEAD FINDINGS Brain: There has been interval expansion of previously identified ventral medullary infarct, now extending posteriorly to traversed the entirety of the medulla to the floor of the fourth ventricle (series 5, images 63, 64, 65), slightly worse on the left. Additionally, there are new scattered patchy ischemic infarcts involving the right cerebellum. Few additional patchy small volume ischemic infarcts noted involving the cortical aspects of the right greater than left occipital lobes (series 5, images 79, 77, 75, 73, 72 no significant associated mass effect. Chronic right cerebellar microhemorrhage noted. There is a new small focus of susceptibility artifact within the adjacent right cerebellum, likely a punctate focus of petechial blood products (series 14, image 20). No other associated blood products. Remainder the brain is otherwise stable in appearance. Stable cerebral volume with chronic small vessel ischemic disease. No new focal parenchymal signal abnormality. Pituitary gland and suprasellar region remain within normal limits. Midline encephalomalacia with chronic hemosiderin staining at the anterior right frontal lobe again noted. Remote lacunar infarcts again noted about the cerebellum, pons, thalami, and left posterior corona radiata. No mass lesion, significant mass effect, or midline shift. Stable ventricular size without hydrocephalus. No extra-axial fluid collection. Vascular: Susceptibility artifact now seen about the vertebrobasilar junction related to interval stenting. Major intracranial vascular flow voids are maintained. Skull and upper cervical spine: Craniocervical junction within normal limits. Bone marrow  signal intensity normal. No new scalp soft tissue abnormality. Sinuses/Orbits: Globes and orbital soft tissues within normal limits. Moderate mucosal thickening throughout the paranasal sinuses with fluid within the nasopharynx. Patient is intubated.  Mastoid air cells remain largely clear. Other: None. MRA HEAD FINDINGS Anterior circulation: Visualized distal cervical segments of the internal carotid arteries remain patent with antegrade flow. Petrous segments remain widely patent. Atheromatous irregularity about both carotid siphons with associated moderate stenosis at the supraclinoid left ICA. A1 segments patent. Normal anterior communicating artery complex. Left ACA remains patent. Distal severe right A2/A3 stenosis noted (series 1, image 164), stable. Left M1 segment remains patent. Mild to moderate long segment right M1 stenosis again noted. Negative MCA bifurcations. No proximal MCA branch occlusion. Distal MCA branches remain perfused and symmetric, although demonstrate mild small vessel atheromatous irregularity. Posterior circulation: Heterogeneous atheromatous irregularity again noted about both proximal V4 segments as they course into the cranial vault. Multifocal moderate stenoses involving the proximal left V4 segment again noted. Left V4 remains patent to the takeoff of the left PICA which remains patent and well perfused. Occlusion of the distal left V4 segment beyond the left PICA again noted, stable. On the right, there has been interval placement of a vascular stent extending from the distal right V4 segment across the vertebrobasilar junction into the proximal basilar artery. This traverses the previously seen high-grade stenosis at this level. Grossly patent flow through the stent, although evaluation for possible intra stent stenosis limited by MRA. Stable and fairly robust flow seen within the basilar artery distally. Suspected short-segment fenestration noted at the distal basilar artery. Superior cerebellar arteries remain patent. Right PCA primarily supplied via the basilar. Left PCA supplied via the basilar as well as a robust left posterior communicating artery. Atheromatous disease involving both PCAs with associated severe bilateral  P2 stenoses. PCAs remain patent to their distal aspects although demonstrate small vessel atheromatous irregularity. Anatomic variants: None significant. IMPRESSION: MRI HEAD IMPRESSION: 1. 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 Additionally, there are new scattered small volume ischemic infarcts involving the right cerebellum as well as the cortical aspects of the right greater than left occipital lobes. No associated mass effect. Single punctate focus of associated petechial hemorrhage at the right cerebellum as above. 2. Otherwise stable appearance of the brain with atrophy, chronic microvascular ischemic disease, and multiple chronic ischemic infarcts as above. MRA HEAD IMPRESSION: 1. Interval placement of a vascular stent extending from the distal right V4 segment across the vertebrobasilar junction into the proximal basilar artery. Grossly patent flow through the stent, although evaluation for possible intra stent stenosis is limited by MRA. Stable and fairly robust flow seen within the basilar artery distally. 2. Otherwise stable appearance of the intracranial circulation with moderate multifocal stenoses involving the proximal left V4 segment with distal left V4 occlusion, severe right A2/A3 stenosis, severe bilateral P2 stenoses, moderate supraclinoid left ICA stenosis, with mild to moderate long segment right M1 stenosis. Electronically Signed   By: Jeannine Boga M.D.   On: 08/05/2021 02:44   MR ANGIO HEAD WO CONTRAST  Result Date: 08/05/2021 CLINICAL DATA:  66 year old male with history of known medullary infarct and severe vertebrobasilar disease, status post catheter directed revascularization and stenting. EXAM: MRI HEAD WITHOUT CONTRAST MRA HEAD WITHOUT CONTRAST TECHNIQUE: Multiplanar, multi-echo pulse sequences of the brain and surrounding structures were acquired without  intravenous contrast. Angiographic images of the Circle  of Willis were acquired using MRA technique without intravenous contrast. COMPARISON:  Comparison made with arteriogram from earlier the same day as well as multiple previous studies. FINDINGS: MRI HEAD FINDINGS Brain: There has been interval expansion of previously identified ventral medullary infarct, now extending posteriorly to traversed the entirety of the medulla to the floor of the fourth ventricle (series 5, images 63, 64, 65), slightly worse on the left. Additionally, there are new scattered patchy ischemic infarcts involving the right cerebellum. Few additional patchy small volume ischemic infarcts noted involving the cortical aspects of the right greater than left occipital lobes (series 5, images 79, 77, 75, 73, 72 no significant associated mass effect. Chronic right cerebellar microhemorrhage noted. There is a new small focus of susceptibility artifact within the adjacent right cerebellum, likely a punctate focus of petechial blood products (series 14, image 20). No other associated blood products. Remainder the brain is otherwise stable in appearance. Stable cerebral volume with chronic small vessel ischemic disease. No new focal parenchymal signal abnormality. Pituitary gland and suprasellar region remain within normal limits. Midline encephalomalacia with chronic hemosiderin staining at the anterior right frontal lobe again noted. Remote lacunar infarcts again noted about the cerebellum, pons, thalami, and left posterior corona radiata. No mass lesion, significant mass effect, or midline shift. Stable ventricular size without hydrocephalus. No extra-axial fluid collection. Vascular: Susceptibility artifact now seen about the vertebrobasilar junction related to interval stenting. Major intracranial vascular flow voids are maintained. Skull and upper cervical spine: Craniocervical junction within normal limits. Bone marrow signal intensity normal.  No new scalp soft tissue abnormality. Sinuses/Orbits: Globes and orbital soft tissues within normal limits. Moderate mucosal thickening throughout the paranasal sinuses with fluid within the nasopharynx. Patient is intubated. Mastoid air cells remain largely clear. Other: None. MRA HEAD FINDINGS Anterior circulation: Visualized distal cervical segments of the internal carotid arteries remain patent with antegrade flow. Petrous segments remain widely patent. Atheromatous irregularity about both carotid siphons with associated moderate stenosis at the supraclinoid left ICA. A1 segments patent. Normal anterior communicating artery complex. Left ACA remains patent. Distal severe right A2/A3 stenosis noted (series 1, image 164), stable. Left M1 segment remains patent. Mild to moderate long segment right M1 stenosis again noted. Negative MCA bifurcations. No proximal MCA branch occlusion. Distal MCA branches remain perfused and symmetric, although demonstrate mild small vessel atheromatous irregularity. Posterior circulation: Heterogeneous atheromatous irregularity again noted about both proximal V4 segments as they course into the cranial vault. Multifocal moderate stenoses involving the proximal left V4 segment again noted. Left V4 remains patent to the takeoff of the left PICA which remains patent and well perfused. Occlusion of the distal left V4 segment beyond the left PICA again noted, stable. On the right, there has been interval placement of a vascular stent extending from the distal right V4 segment across the vertebrobasilar junction into the proximal basilar artery. This traverses the previously seen high-grade stenosis at this level. Grossly patent flow through the stent, although evaluation for possible intra stent stenosis limited by MRA. Stable and fairly robust flow seen within the basilar artery distally. Suspected short-segment fenestration noted at the distal basilar artery. Superior cerebellar arteries  remain patent. Right PCA primarily supplied via the basilar. Left PCA supplied via the basilar as well as a robust left posterior communicating artery. Atheromatous disease involving both PCAs with associated severe bilateral P2 stenoses. PCAs remain patent to their distal aspects although demonstrate small vessel atheromatous irregularity. Anatomic variants: None significant. IMPRESSION: MRI HEAD IMPRESSION: 1.  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 Additionally, there are new scattered small volume ischemic infarcts involving the right cerebellum as well as the cortical aspects of the right greater than left occipital lobes. No associated mass effect. Single punctate focus of associated petechial hemorrhage at the right cerebellum as above. 2. Otherwise stable appearance of the brain with atrophy, chronic microvascular ischemic disease, and multiple chronic ischemic infarcts as above. MRA HEAD IMPRESSION: 1. Interval placement of a vascular stent extending from the distal right V4 segment across the vertebrobasilar junction into the proximal basilar artery. Grossly patent flow through the stent, although evaluation for possible intra stent stenosis is limited by MRA. Stable and fairly robust flow seen within the basilar artery distally. 2. Otherwise stable appearance of the intracranial circulation with moderate multifocal stenoses involving the proximal left V4 segment with distal left V4 occlusion, severe right A2/A3 stenosis, severe bilateral P2 stenoses, moderate supraclinoid left ICA stenosis, with mild to moderate long segment right M1 stenosis. Electronically Signed   By: Jeannine Boga M.D.   On: 08/05/2021 02:44   DG Abd 1 View  Result Date: 08/04/2021 CLINICAL DATA:  NG tube placement EXAM: ABDOMEN - 1 VIEW COMPARISON:  10/11/2012 FINDINGS: Tip of NG tube is seen in the region  of the antrum of the stomach. Bowel gas pattern is nonspecific. Pelvis is not included in its entirety. IMPRESSION: Tip of NG tube is seen in the stomach. Electronically Signed   By: Elmer Picker M.D.   On: 08/04/2021 17:32   DG Chest Port 1 View  Result Date: 08/04/2021 CLINICAL DATA:  Intubated EXAM: PORTABLE CHEST 1 VIEW COMPARISON:  08/04/2021 FINDINGS: Endotracheal tube tip is about 4.7 cm superior to the carina. Subsegmental atelectasis at the left base. Cardiomegaly. No consolidation, pleural effusion or pneumothorax IMPRESSION: 1. Endotracheal tube tip about 4.7 cm superior to the carina. 2. Cardiomegaly.  Subsegmental atelectasis at the left base. Electronically Signed   By: Donavan Foil M.D.   On: 08/04/2021 15:10   DG Chest Port 1 View  Result Date: 08/04/2021 CLINICAL DATA:  Endotracheal intubation EXAM: PORTABLE CHEST 1 VIEW COMPARISON:  Earlier same day FINDINGS: Endotracheal tube tip 6 cm above the carina. Cardiomegaly persists. Pulmonary venous hypertension, possibly with early interstitial edema. Right lateral chest not included on the image. No evidence of collapse or effusion. IMPRESSION: Endotracheal tube tip 6 cm above the carina. Electronically Signed   By: Nelson Chimes M.D.   On: 08/04/2021 13:42   MR BRAIN WO CONTRAST  Result Date: 08/03/2021 CLINICAL DATA:  66 year old male with severe intracranial atherosclerosis and questionable medullary ischemia on brain MRI yesterday. EXAM: MRI HEAD WITHOUT CONTRAST TECHNIQUE: Multiplanar, multiecho pulse sequences of the brain and surrounding structures were obtained without intravenous contrast. COMPARISON:  CTA head and neck this morning.  Brain MRI yesterday. FINDINGS: Brain: More convincing appearance of ventral medullary restricted diffusion, asymmetrically greater to the left of midline. See series 5, image 12, series 7, images 18 and 19. Mild if any associated T2 and FLAIR hyperintensity there. And no other convincing  diffusion restriction despite the CTA findings earlier today. 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 Chronic encephalomalacia right inferior frontal gyrus with hemosiderin. Occasional chronic microhemorrhages elsewhere, right cerebellum (series 13, image 19). No new signal abnormality compared to yesterday. No midline shift, mass effect, evidence of mass lesion, ventriculomegaly,  extra-axial collection or acute intracranial hemorrhage. Cervicomedullary junction and pituitary are within normal limits. Vascular: Major intracranial vascular flow voids are stable from yesterday. See also CTA today reported separately. Skull and upper cervical spine: Negative. Visualized bone marrow signal is within normal limits. Sinuses/Orbits: Stable, negative. Other: Visible internal auditory structures appear normal. Mastoids are clear. Negative visible scalp and face. IMPRESSION: 1. Positive for ischemia/infarction of the ventral Medulla as suspected yesterday, slightly greater on the left. No associated hemorrhage or mass effect. 2. 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 These results were communicated to Dr. Rory Percy at 11:06 am on 08/03/2021 by text page via the [facility] messaging system. Electronically Signed   By: Genevie Ann M.D.   On: 08/03/2021 11:06    Labs:  CBC: Recent Labs    08/02/21 2027 08/05/21 0531 08/06/21 0512 08/07/21 0225  WBC 6.4 12.5* 9.0 8.9  HGB 14.2 13.6 12.6* 13.2  HCT 43.0 40.3 38.5* 40.4  PLT 296 303 270 291    COAGS: Recent Labs    08/04/21 2329 08/05/21 0531 08/06/21 0512 08/07/21 0225  APTT 45* _0 BMP: Recent Labs    08/03/21 0347 08/05/21 0531 08/06/21 0512 08/07/21 0225  NA 136 140 139 139  K 3.4* 3.3* 4.3 3.5  CL 101 107 106 104  CO2 _1 GLUCOSE 212*  222* 216* 290*  BUN _2 CALCIUM 9.0 7.9* 8.2* 8.7*  CREATININE 0.76 0.88 0.77 0.72  GFRNONAA >60 >60 >60 >60    LIVER FUNCTION TESTS: Recent Labs    08/02/21 2027  BILITOT 0.5  AST 26  ALT 28  ALKPHOS 29*  PROT 7.3  ALBUMIN 4.2    Assessment and Plan:  66 y/o M s/p right vertebrobasilar junction stent placement 08/04/21 in NIR, seen today for follow up.  Patient s/p trach/PEG yesterday, no movement in any extremity but does move facial muscles/eyes per PT evaluation. No attempts at talking today. Per daughter able to blink once for now and twice for yes consistently when asked simple questions.  No further NIR recommendations aside from continuing ASA 81 mg QD + Brilinta 90 mg BID due to presence of stent, further plans per neurology/primary team.  NIR remains available as needed, please call with questions or concerns.  Electronically Signed: Joaquim Nam, PA-C 08/07/2021, 10:12 AM   I spent a total of 15 Minutes at the the patient's bedside AND on the patient's hospital floor or unit, greater than 50% of which was counseling/coordinating care for right vertebrobasilar stent placement follow up.

## 2021-08-07 NOTE — Evaluation (Signed)
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[place]) 08/03/2021   Dysuria 01/23/2019   Encounter for general adult medical examination with abnormal findings 01/23/2019   Type 2 diabetes mellitus with hyperglycemia, without long-term current use of insulin ([place]) 10/31/2018   Erectile dysfunction 10/31/2018   Mixed hyperlipidemia 07/10/2018   Uncontrolled type 2 diabetes mellitus with hyperglycemia ([place]) 06/10/2018   Hypertension 05/24/2018   Encounter for screening colonoscopy    Benign neoplasm of descending colon    Polyp of sigmoid colon    Rectal polyp    Closed fracture of temporal bone ([place]) 09/09/2015   Right frontal lobe punctate hemorrhage ([place]) 09/09/2015   Angioedema 07/19/2015   Malignant essential hypertension 07/19/2015   Past Medical History:  Past Medical History:  Diagnosis Date   Diabetes mellitus without complication ([place])    GERD (gastroesophageal reflux disease)    Hemorrhage in the brain ([place]) 08/2015   High cholesterol    Hyperlipidemia    Hypertension    Hypertensive emergency 08/03/2021   Past Surgical History:  Past Surgical History:  Procedure Laterality Date   COLONOSCOPY WITH PROPOFOL N/A 09/08/2017   Procedure: COLONOSCOPY WITH PROPOFOL;  Surgeon: Lucilla Lame, MD;  Location: [place];  Service: Endoscopy;  Laterality: N/A;  Diabetic - oral meds   IR INTRA CRAN STENT  08/04/2021   partial intestine removed     approx date: 1980   POLYPECTOMY  09/08/2017   Procedure: POLYPECTOMY;  Surgeon: Lucilla Lame, MD;  Location: [place];  Service: Endoscopy;;   RADIOLOGY WITH  ANESTHESIA N/A 08/04/2021   Procedure: Angioplasty/stenting of vertebrobasilar stenosis;  Surgeon: Luanne Bras, MD;  Location: [place];  Service: Radiology;  Laterality: N/A;   HPI:  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 He  presented 08/02/21 with R foot pain and R-sided weakness. Transferred to [facility] 7/25. 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 S/p stent placement to the R vertebrobasilar junction 7/26, failed extubation. Trach and PEG placed 7/28. PMH: DM, GERD, TBI with prior ICH, HLD, HTN   Assessment / Plan / Recommendation Clinical Impression  Pt participated in assessment of basic language comprehension and of potential to use augmentative communication. Per notes (and there was discrepancy in chart), he has been blinking twice for "yes" and blinking once for "no." Sign was posted over bed to remind caregivers -please cue pt with instructions when asking yes/no questions. (RN will confirm with his daughter that this was the correct # to avoid confusion for Mr. Bow.) His yes/no reliability for basic biographical questions was accurate. We trialed use of the EyeLink2 eye gaze letter board to determine any benefit for communication. Unfortunately, nystagmus interfered with the process, but he was able to center gaze and spell simple words to dictation with max assist for success. This is a low tech  device but may offer some communication support with practice.  Will reach out to the [place] on Monday to inquire about higher tech eye gaze devices that may be beneficial.  SLP will follow. D/W  RN.    SLP Assessment  SLP Recommendation/Assessment: Patient needs continued Speech [place] [facility] SLP Visit Diagnosis: Cognitive communication deficit (R41.841)    Recommendations for follow up therapy are one component of a multi-disciplinary discharge planning process, led by the attending physician.  Recommendations may be updated based on patient status, additional functional criteria and insurance authorization.    Follow Up Recommendations  Other (comment) (tba)    Assistance Recommended at Discharge  Frequent or constant Supervision/Assistance  Functional Status Assessment Patient has had a recent decline in their functional status and/or demonstrates limited ability to make significant improvements in function in a reasonable and predictable amount of time  Frequency and Duration min 2x/week  2 weeks      SLP Evaluation Cognition  Overall Cognitive Status: Difficult to assess Orientation Level: Intubated/Tracheostomy - Unable to assess       Comprehension  Auditory Comprehension Overall Auditory Comprehension: Appears within functional limits for tasks assessed Yes/No Questions: Within Functional Limits (for biographical information) Commands: Not tested Visual Recognition/Discrimination Discrimination: Not tested Reading Comprehension Reading Status:  (able to focus on letters on eye gaze board)    Expression Expression Primary Mode of Expression: Augmentative device Verbal Expression Overall Verbal Expression: Impaired   Oral / Motor  Oral Motor/Sensory Function Overall Oral Motor/Sensory Function: Severe impairment (limited facial movement; minimal tongue extension, able to purse lips lightly) Motor Speech Overall Motor Speech: Impaired            Assunta Curtis 08/07/2021, 4:48 PM Brogen Duell L. Tivis Ringer, MA CCC/SLP Clinical Specialist - [place] Office number [phone]

## 2021-08-07 NOTE — Progress Notes (Signed)
[facility] ADULT ICU REPLACEMENT PROTOCOL   The patient does apply for the [facility] Adult ICU Electrolyte Replacment Protocol based on the criteria listed below:   1.Exclusion criteria: TCTS patients, ECMO patients, and Dialysis patients 2. Is GFR >/= 30 ml/min? Yes.    Patient's GFR today is >60 3. Is SCr </= 2? Yes.   Patient's SCr is 0.72 mg/dL 4. Did SCr increase >/= 0.5 in 24 hours? No. 5.Pt's weight >40kg  Yes.   6. Abnormal electrolyte(s): K+ 3.5  7. Electrolytes replaced per protocol 8.  Call MD STAT for K+ </= 2.5, Phos </= 1, or Mag </= 1 Physician:  n/a   Joshua Donovan 08/07/2021 4:09 AM

## 2021-08-07 NOTE — Progress Notes (Addendum)
NAME:  Joshua [facility], MRN:  [id number], DOB:  11/17/1955, LOS: 4 ADMISSION DATE:  08/03/2021, CONSULTATION DATE:  08/04/21 REFERRING MD:  Dr. Gerhard [facility], CHIEF COMPLAINT:  CVA   History of Present Illness:  66 year old male with prior history of HTN, TBI with prior ICH, DM, and HLD who presented to [facility] 7/24 with right hand numbness and right LE pain and weakness.  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  He was admitted for further stroke workup.  His weakness worsened to include right arm, right leg and left leg.  CTA angio head revealed multiple intracranial severe stenoses, near- occlusive stenosis of the vertebrobasilar junction, occlusion of the distal left V4, moderate mid basilar stenosis, nearly occlusive bilateral PCA P2 segments, nearly occlusive distal right ACA A2 segments and bulky soft plaque or thrombus in the supraclinoid left ICA with mild to moderate right M1 stenosis.  He was transferred to [facility] for further stroke care and Neuro IR evaluation on 7/25.  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  Additionally noted occlusion of left V4 distal to origin of PICA and patent bilateral P-comm arteries.  Heparin gtt ordered as well as plavix.  Remains on cleviprex gtt for strict SBP parameters.  Patient left intubated but weaning in PACU while awaiting ICU bed.  PCCM consulted for further ventilator management.    Pertinent  Medical History  TBI with ICH, DM, HTN, HLD  Significant Hospital Events: Including procedures, antibiotic start and stop dates in addition to other pertinent events   7/24 presented to [facility], ventral medulla CVA 7/25 tx to [facility] 7/26 cerebral angiogram with stent placement to right vertebro-basilar junction, failed extubation, left radial aline MRI brain> mild extension of stroke, now involving bilateral medial medullary, patent basilar artery and R  VBJ stent  7/28 underwent bedside percutaneous tracheostomy and PEG tube placement, tolerated well 7/29 no acute issues overnight currently on SBT trial this a.m. and tolerating well, Cleviprex resumed earlier this a.m. due to severe hypertension   Interim History / Subjective:  Continues to communicate with blinking but remains quadriplegic Remains on low-dose Precedex  Objective   Blood pressure (!) 158/90, pulse 71, temperature 99.4 F (37.4 C), temperature source Axillary, resp. rate 19, height _0  (1.702 m), weight 89.1 kg, SpO2 100 %.    Vent Mode: PRVC FiO2 (%):  [40 %] 40 % Set Rate:  [18 bmp] 18 bmp Vt Set:  [520 mL] 520 mL PEEP:  [5 cmH20] 5 cmH20 Pressure Support:  [5 cmH20] 5 cmH20 Plateau Pressure:  [14 cmH20-16 cmH20] 16 cmH20   Intake/Output Summary (Last 24 hours) at 08/07/2021 2111 Last data filed at 08/07/2021 0700 Gross per 24 hour  Intake 1969.43 ml  Output 2150 ml  Net -180.57 ml    Filed Weights   08/04/21 0914 08/07/21 0500  Weight: 83.9 kg 89.1 kg   Examination: General: Acute on chronic ill appearing middle-aged male lying in bed on ventilator through tracheostomy in no acute distress HEENT: 8 cuffed Shiley trach midline, MM pink/moist, PERRL,  Neuro: Able to communicate by blinking, quadriplegia CV: s1s2 regular rate and rhythm, no murmur, rubs, or gallops,  PULM: Clear to auscultation bilaterally, no increased work of breathing, tolerating ventilator wean GI: soft, bowel sounds active in all 4 quadrants, non-tender, non-distended, tolerating TF Extremities: warm/dry, no edema  Skin: no rashes or lesions  Resolved Hospital Problem list  Assessment & Plan:   Acute respiratory insufficiency related to below -Underwent percutaneous tracheostomy and PEG tube placement 7/28 Concern for evolving pneumonia -7/28 patient was seen with increased tracheal secretions resulting in BAL brain performed on tracheostomy and empiric antibiotics  started P: Continue ventilator support with lung protective strategies  Wean PEEP and FiO2 for sats greater than 90%. Head of bed elevated 30 degrees. Plateau pressures less than 30 cm H20.  Follow intermittent chest x-ray and ABG.   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 s/p stent placement to right vertebro-basilar junction  Severe intracranial atherosclerosis -Continues with ongoing quadriplegia P: Primary management per neurology/neuro IR Maintain neuro protective measures; goal for eurothermia, euglycemia, eunatermia, normoxia, and PCO2 goal of 35-40 Nutrition and bowel regiment  Seizure precautions  AEDs per neurology  Aspirations precautions  SBP goal less than 180 Continue Brilinta/aspirin Patient will need LTAC versus SNF pending with vent wean  HTN emergency Grade 1 diastolic dysfunction seen on echocardiogram -TTE EF 60-65%, no WMA, G1DD, normal RV/ valves History of hyperlipidemia P: Continue home statin Brilinta/aspirin as above  Resume home lisinopril and Lopressor  DM -A1c 8.3 P: Continue SSI Continue Levemir CBG goal 140-180  ?hx of ETOH abuse - reported 1 beer a day P: CIWA protocol Monitor for withdrawals Supplement thiamine, folate, and multivitamin   Best Practice (right click and "Reselect all SmartList Selections" daily)   Diet/type: NPO;  TF per RD recs> restart 4hrs post PEG.  Scheduled bowel regimen DVT prophylaxis: prophylactic heparin  GI prophylaxis: PPI Lines: N/A Foley:  Yes, and it is no longer needed and removal ordered > frequent bladder scans to monitor for retention Code Status:  full code Last date of multidisciplinary goals of care discussion: No family at bedside a.m. of 7/29  we will call for daily update later today  Critical care time: 37 mins  Joshua Cordova D. Kenton Kingfisher, NP-C [facility] Personal contact information can be found on Amion  08/07/2021, 8:05 AM

## 2021-08-07 NOTE — Progress Notes (Signed)
G tube site clean, g tube functioning well -call for new concerns

## 2021-08-07 NOTE — Progress Notes (Addendum)
STROKE TEAM PROGRESS NOTE   SUBJECTIVE (INTERVAL HISTORY) Trach and PEG yesterday. Home meds resumed for BP control  OBJECTIVE Temp:  [98.3 F (36.8 C)-99.7 F (37.6 C)] 99.7 F (37.6 C) (07/29 0800) Pulse Rate:  [66-117] 79 (07/29 0800) Cardiac Rhythm: Normal sinus rhythm (07/29 0800) Resp:  [15-30] 26 (07/29 0800) BP: (114-218)/(64-98) 159/84 (07/29 0800) SpO2:  [96 %-100 %] 100 % (07/29 0800) Arterial Line BP: (100-206)/(69-100) 174/74 (07/28 1600) FiO2 (%):  [40 %] 40 % (07/29 0757) Weight:  [89.1 kg] 89.1 kg (07/29 0500)  Recent Labs  Lab 08/06/21 1617 08/06/21 1922 08/06/21 2312 08/07/21 0330 08/07/21 0743  GLUCAP 172* 161* 179* 297* 238*    Recent Labs  Lab 08/02/21 2027 08/03/21 0347 08/05/21 0531 08/05/21 1619 08/06/21 0512 08/06/21 1812 08/07/21 0225  NA 136 136 140  --  139  --  139  K 3.1* 3.4* 3.3*  --  4.3  --  3.5  CL 101 101 107  --  106  --  104  CO2 '25 24 25  '$ --  22  --  26  GLUCOSE 182* 212* 222*  --  216*  --  290*  BUN '14 11 14  '$ --  11  --  12  CREATININE 0.70 0.76 0.88  --  0.77  --  0.72  CALCIUM 9.9 9.0 7.9*  --  8.2*  --  8.7*  MG  --  1.6*  --  3.0* 2.6* 2.1 2.0  PHOS  --   --   --  2.9 3.3 2.9 3.2    Recent Labs  Lab 08/02/21 2027  AST 26  ALT 28  ALKPHOS 29*  BILITOT 0.5  PROT 7.3  ALBUMIN 4.2    Recent Labs  Lab 08/02/21 2027 08/05/21 0531 08/06/21 0512 08/07/21 0225  WBC 6.4 12.5* 9.0 8.9  NEUTROABS 4.3  --   --   --   HGB 14.2 13.6 12.6* 13.2  HCT 43.0 40.3 38.5* 40.4  MCV 85.5 87.4 89.1 89.2  PLT 296 303 270 291    No results for input(s): "CKTOTAL", "CKMB", "CKMBINDEX", "TROPONINI" in the last 168 hours. No results for input(s): "LABPROT", "INR" in the last 72 hours. Recent Labs    08/06/21 1811  COLORURINE RED*  LABSPEC 1.016  PHURINE 6.0  GLUCOSEU 50*  HGBUR LARGE*  BILIRUBINUR NEGATIVE  KETONESUR NEGATIVE  PROTEINUR 100*  NITRITE NEGATIVE  LEUKOCYTESUR NEGATIVE        Component Value  Date/Time   CHOL 203 (H) 08/03/2021 0347   TRIG 175 (H) 08/03/2021 0347   HDL 52 08/03/2021 0347   CHOLHDL 3.9 08/03/2021 0347   VLDL 35 08/03/2021 0347   LDLCALC 116 (H) 08/03/2021 0347   Lab Results  Component Value Date   HGBA1C 8.3 (H) 08/03/2021   No results found for: "LABOPIA", "COCAINSCRNUR", "LABBENZ", "AMPHETMU", "THCU", "LABBARB"  No results for input(s): "ETH" in the last 168 hours.  I have personally reviewed the radiological images below and agree with the radiology interpretations.  DG Chest Port 1 View  Result Date: 08/06/2021 CLINICAL DATA:  Status post tracheostomy EXAM: PORTABLE CHEST 1 VIEW COMPARISON:  None Available. FINDINGS: Tracheostomy tube with tip in the proximal trachea in appropriate position. New mild LEFT basilar atelectasis. Mild central venous congestion. IMPRESSION: Tracheostomy tube in good position. New LEFT basilar atelectasis. Electronically Signed   By: Suzy Bouchard M.D.   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 He presented with progressively worsening weakness in the right lower extremity, left lower extremity, right upper extremity, and left upper extremity. On the morning of the procedure, patient was flaccid at the right-sided extremities and had severe weakness in the left-sided extremities (grade 1/5). 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 There was occlusion of the left vertebral artery seen distal to the origin of the PICA. MRI of the brain demonstrated ischemic infarction of the ventral medulla slightly greater on  the left. EXAM: 1.  Biplane right carotid and cerebral DSA angiogram. 2.  Biplane left common carotid and cerebral DSA cerebral angiogram 3. Indirect left vertebral and cerebral angiogram with tip of the catheter at the origin of the left vertebral artery 4.  Right subclavian arteriogram. 5. Selective catheterization of the right vertebral artery, biplane vertebral and cerebral DSA cerebral angiogram. 6. Selective catheterization of the right vertebral artery by using the 088 neuron max guide catheter, 5 French AXS catalyst catheter as an intermediate catheter and 021 phenom microcatheter with a 014 Arostotle microwire. 7. High magnification biplane cerebral angiogram and exchange of the microcatheter over an exchange length 014 zoom wire into an Apex 2.25 x 15 mm monorail balloon catheter and angioplasty of the severely 90% stenotic right vertebrobasilar junction. 8. Resolute onyx 3 mm x 22 mm stent placement at the site of the stenosis. 9. Post stent biplane DSA cerebral angiography of the right vertebral artery injection. 10. Right common femoral arteriogram in the RAO projection after withdrawing the sheath with its tip in the external iliac artery and arteriogram Attending: Dr. Frazier Richards Assistant: Dr. Luanne Bras COMPARISON:  CORRELATION IS MADE WITH CTA OF THE HEAD AND NECK DATED August 03, 2021 MEDICATIONS: The antibiotic was administered within 1 hour of the procedure Injection Ancef 2 g Injection Integrilin 4.5 mg at 1.5 mg increments intra-arterially through the right vertebral artery Heparin 3500 units ANESTHESIA/SEDATION: Anesthesia was provided by the anesthesia team. Please refer to the anesthesia notes for detailed information. CONTRAST:  126 mL of Omnipaque 300 FLUOROSCOPY: Fluoroscopy Time: 48 minutes 48 seconds (3499 mGy). COMPLICATIONS: None immediate. TECHNIQUE: Informed written consent was obtained from the patient and his daughter after a thorough discussion of the procedural risks  including but not limited to approximately 5% bleeding, hematoma, vascular injury, worsening of the stroke, ventilator dependency etc. Benefits and alternatives. All questions were addressed. Maximal Sterile Barrier Technique was utilized including caps, mask, sterile gowns, sterile gloves, sterile drape, hand hygiene and skin antiseptic. A timeout was performed prior to the initiation of the procedure. PROCEDURE: The right groin was prepped and draped in the usual sterile fashion. Thereafter using modified Seldinger technique, transfemoral access into the right common femoral artery was obtained without difficulty. Over a 0.035 inch guidewire, an 8 French x 25 cm pinnacle sheath was inserted. Through this, and also over 0.035 inch roadrunner wire, a 5 [place] JB 1 catheter was advanced to the aortic arch region and selectively positioned in the right common carotid artery, and biplane DSA carotid and cerebral angiogram was obtained. Next, with the catheter tip in the left common carotid artery, biplane DSA carotid and cerebral Angiography of the head was  obtained. The, selective catheterization of the left subclavian artery followed by angiogram was performed. With the tip of the catheter at the origin of the left vertebral artery, indirect left vertebral and biplane DSA cerebral angiography of left subclavian arterial injection was performed. Next, selective catheterization of the right subclavian artery was performed and the catheter was advanced into the proximal subclavian artery. Next, the wire was exchanged for an exchange length 035 Rosen wire. The JB 1 catheter was removed and 088 neuron max guide catheter and 5 French AXS catalyst catheter were advanced and selective catheterization of the left vertebral artery followed by vertebral and cerebral angiogram was performed. Next, under high magnification angiography, and under roadmap guidance, phenom 21 microcatheter and 0 1 for restarting microwire were  advanced into the left posterior cerebral artery and angiogram was performed. Next, the wire was exchanged for a 014 zoom exchange length wire. Subsequently, we selected 2.25 x 15 mm Apex monorail microcatheter and under roadmap guidance, angioplasty of the severely stenotic right vertebrobasilar artery was performed. Post angioplasty angiogram was performed. Next, the balloon was removed. Measurements were made. Next we selected 3 mm x 22 mm resolute onyx stent and the stent was deployed at the right vertebrobasilar stenotic site. The stent was gently dilated and was inflated up to 10 atmospheric pressure. Next, post stent biplane DSA cerebral angiogram was performed. At this point in time, we felt the goal of the procedure was obtained. The catheter was removed. Femoral access site was further sterilely cleaned and the femoral sheath was withdrawn with its tip in the right external iliac artery, femoral angiogram was performed in the RAO projection and the femoral access site was closed by using an 8 [place] Angio-Seal. Patient tolerated the procedure well and was transferred to the ICU intubated in stable condition. No immediate complications. FINDINGS: Right CCA: The right common carotid arteriogram demonstrates the right external carotid artery and its major branches to be widely patent. Right ICA: The right internal carotid artery at the bulb to the cranial skull base opacifies normally. No significant stenosis. Right Intracranial: The petrous and cavernous segments are widely patent. Mild 20-30% stenosis at the supraclinoid ICA. The right middle cerebral artery and the right anterior cerebral artery opacify normally into the capillary and venous phases. Mild-to-moderate atheromatous disease with about 30% stenosis in the right M1 segment. There is severe about 70% stenosis of the right pericallosal branch of the ACA seen in the proximal aspect. There is cross filling via the anterior communicating artery seen  with opacification of the left anterior cerebral artery. PCOM is patent with faint opacification of the right posterior cerebral artery. Right Sided Anterior Venous: The venous phase demonstrates preferential egress of contrast into the right transverse sinus, the sigmoid sinus and the right internal jugular vein. There is no early venous shunting into the venous structures at the skull base or intracranially into the dural sinuses. Left CCA: The left common carotid arteriogram demonstrates the left external carotid artery and its major branches to be widely patent. Left ICA: The left internal carotid artery at the bulb to the cranial skull base opacifies normally. Mild atheromatous disease at the origin of the ICA. There is mild stenosis seen about 2 cm from the origin of the left ICA measuring 42 percent by the NASCET criteria. Left Intracranial: The petrous, cavernous and supraclinoid segments are widely patent. The left middle cerebral artery and the left anterior cerebral artery opacify normally into the capillary and venous phases. There  is a prominent PCOM opacifying the bilateral posterior cerebral arteries via the distal basilar artery. Left Sided Anterior Venous: The venous phase demonstrates preferential egress of contrast into the right transverse sinus, the sigmoid sinus and the right internal jugular vein. There is opacification of the left transverse sigmoid sinuses and internal jugular veins seen as well. There is no early venous shunting into the venous structures at the skull base or intracranially into the dural sinuses. Indirect left vertebral and cerebral angiogram with injection of the left subclavian artery demonstrate faint opacification of the left vertebral artery. 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 High magnification biplane DSA cerebral angiography of right vertebral artery injection demonstrate severe about 90% stenosis at the right  vertebrobasilar junction in length of about 7 mm. Post angioplasty angiogram demonstrate significant residual stenoses. Post stent angiogram demonstrate complete expansion of the stent with no residual stenosis. There is about 40-50% stenosis seen at the mid basilar artery. Right PICA, bilateral superior cerebellar, AICA and posterior cerebral arteries show moderate atheromatous disease. No significant stenosis. IMPRESSION: 1. Right vertebral and cerebral angiogram demonstrated about 90% stenosis at the vertebrobasilar junction. Successful angioplasty and stent placement without significant residual stenosis. 2.  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 3. 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 4. Mild atheromatous disease in the right intracranial ICA with 20-30% stenosis at the supraclinoid ICA and 30% stenosis in the right M1 segment. There is about 70% stenoses of the right pericallosal branch of the ACA seen in the A3 segment. 5.  Bilateral P comms are patent with the left PCOM being prominent. PLAN: 1.  Blood pressure goals between 120-140 mm Hg. 2.  Patient to be on dual antiplatelet agents for 6 months. Electronically Signed   By: Frazier Richards M.D.   On: 08/05/2021 14:38   MR BRAIN WO CONTRAST  Result Date: 08/05/2021 CLINICAL DATA:  66 year old male with history of known medullary infarct and severe vertebrobasilar disease, status post catheter directed revascularization and stenting. EXAM: MRI HEAD WITHOUT CONTRAST MRA HEAD WITHOUT CONTRAST TECHNIQUE: Multiplanar, multi-echo pulse sequences of the brain and surrounding structures were acquired without intravenous contrast. Angiographic images of the Circle of Willis were acquired using MRA technique without intravenous contrast. COMPARISON:  Comparison made with arteriogram from earlier the same day as well as multiple previous studies. FINDINGS: MRI HEAD FINDINGS Brain: There has been interval expansion of  previously identified ventral medullary infarct, now extending posteriorly to traversed the entirety of the medulla to the floor of the fourth ventricle (series 5, images 63, 64, 65), slightly worse on the left. Additionally, there are new scattered patchy ischemic infarcts involving the right cerebellum. Few additional patchy small volume ischemic infarcts noted involving the cortical aspects of the right greater than left occipital lobes (series 5, images 79, 77, 75, 73, 72 no significant associated mass effect. Chronic right cerebellar microhemorrhage noted. There is a new small focus of susceptibility artifact within the adjacent right cerebellum, likely a punctate focus of petechial blood products (series 14, image 20). No other associated blood products. Remainder the brain is otherwise stable in appearance. Stable cerebral volume with chronic small vessel ischemic disease. No new focal parenchymal signal abnormality. Pituitary gland and suprasellar region remain within normal limits. Midline encephalomalacia with chronic hemosiderin staining at the anterior right frontal lobe again noted. Remote lacunar infarcts again noted about the cerebellum, pons, thalami, and left posterior corona  radiata. No mass lesion, significant mass effect, or midline shift. Stable ventricular size without hydrocephalus. No extra-axial fluid collection. Vascular: Susceptibility artifact now seen about the vertebrobasilar junction related to interval stenting. Major intracranial vascular flow voids are maintained. Skull and upper cervical spine: Craniocervical junction within normal limits. Bone marrow signal intensity normal. No new scalp soft tissue abnormality. Sinuses/Orbits: Globes and orbital soft tissues within normal limits. Moderate mucosal thickening throughout the paranasal sinuses with fluid within the nasopharynx. Patient is intubated. Mastoid air cells remain largely clear. Other: None. MRA HEAD FINDINGS Anterior  circulation: Visualized distal cervical segments of the internal carotid arteries remain patent with antegrade flow. Petrous segments remain widely patent. Atheromatous irregularity about both carotid siphons with associated moderate stenosis at the supraclinoid left ICA. A1 segments patent. Normal anterior communicating artery complex. Left ACA remains patent. Distal severe right A2/A3 stenosis noted (series 1, image 164), stable. Left M1 segment remains patent. Mild to moderate long segment right M1 stenosis again noted. Negative MCA bifurcations. No proximal MCA branch occlusion. Distal MCA branches remain perfused and symmetric, although demonstrate mild small vessel atheromatous irregularity. Posterior circulation: Heterogeneous atheromatous irregularity again noted about both proximal V4 segments as they course into the cranial vault. Multifocal moderate stenoses involving the proximal left V4 segment again noted. Left V4 remains patent to the takeoff of the left PICA which remains patent and well perfused. Occlusion of the distal left V4 segment beyond the left PICA again noted, stable. On the right, there has been interval placement of a vascular stent extending from the distal right V4 segment across the vertebrobasilar junction into the proximal basilar artery. This traverses the previously seen high-grade stenosis at this level. Grossly patent flow through the stent, although evaluation for possible intra stent stenosis limited by MRA. Stable and fairly robust flow seen within the basilar artery distally. Suspected short-segment fenestration noted at the distal basilar artery. Superior cerebellar arteries remain patent. Right PCA primarily supplied via the basilar. Left PCA supplied via the basilar as well as a robust left posterior communicating artery. Atheromatous disease involving both PCAs with associated severe bilateral P2 stenoses. PCAs remain patent to their distal aspects although demonstrate  small vessel atheromatous irregularity. Anatomic variants: None significant. IMPRESSION: MRI HEAD IMPRESSION: 1. 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 Additionally, there are new scattered small volume ischemic infarcts involving the right cerebellum as well as the cortical aspects of the right greater than left occipital lobes. No associated mass effect. Single punctate focus of associated petechial hemorrhage at the right cerebellum as above. 2. Otherwise stable appearance of the brain with atrophy, chronic microvascular ischemic disease, and multiple chronic ischemic infarcts as above. MRA HEAD IMPRESSION: 1. Interval placement of a vascular stent extending from the distal right V4 segment across the vertebrobasilar junction into the proximal basilar artery. Grossly patent flow through the stent, although evaluation for possible intra stent stenosis is limited by MRA. Stable and fairly robust flow seen within the basilar artery distally. 2. Otherwise stable appearance of the intracranial circulation with moderate multifocal stenoses involving the proximal left V4 segment with distal left V4 occlusion, severe right A2/A3 stenosis, severe bilateral P2 stenoses, moderate supraclinoid left ICA stenosis, with mild to moderate long segment right M1 stenosis. Electronically Signed   By: Jeannine Boga M.D.   On: 08/05/2021 02:44   MR ANGIO HEAD WO CONTRAST  Result Date: 08/05/2021 CLINICAL DATA:  66 year old male with history of known medullary infarct and severe vertebrobasilar disease, status post catheter directed revascularization and stenting. EXAM: MRI HEAD WITHOUT CONTRAST MRA HEAD WITHOUT CONTRAST TECHNIQUE: Multiplanar, multi-echo pulse sequences of the brain and surrounding structures were acquired without intravenous contrast. Angiographic images of the Circle of Willis were  acquired using MRA technique without intravenous contrast. COMPARISON:  Comparison made with arteriogram from earlier the same day as well as multiple previous studies. FINDINGS: MRI HEAD FINDINGS Brain: There has been interval expansion of previously identified ventral medullary infarct, now extending posteriorly to traversed the entirety of the medulla to the floor of the fourth ventricle (series 5, images 63, 64, 65), slightly worse on the left. Additionally, there are new scattered patchy ischemic infarcts involving the right cerebellum. Few additional patchy small volume ischemic infarcts noted involving the cortical aspects of the right greater than left occipital lobes (series 5, images 79, 77, 75, 73, 72 no significant associated mass effect. Chronic right cerebellar microhemorrhage noted. There is a new small focus of susceptibility artifact within the adjacent right cerebellum, likely a punctate focus of petechial blood products (series 14, image 20). No other associated blood products. Remainder the brain is otherwise stable in appearance. Stable cerebral volume with chronic small vessel ischemic disease. No new focal parenchymal signal abnormality. Pituitary gland and suprasellar region remain within normal limits. Midline encephalomalacia with chronic hemosiderin staining at the anterior right frontal lobe again noted. Remote lacunar infarcts again noted about the cerebellum, pons, thalami, and left posterior corona radiata. No mass lesion, significant mass effect, or midline shift. Stable ventricular size without hydrocephalus. No extra-axial fluid collection. Vascular: Susceptibility artifact now seen about the vertebrobasilar junction related to interval stenting. Major intracranial vascular flow voids are maintained. Skull and upper cervical spine: Craniocervical junction within normal limits. Bone marrow signal intensity normal. No new scalp soft tissue abnormality. Sinuses/Orbits: Globes and  orbital soft tissues within normal limits. Moderate mucosal thickening throughout the paranasal sinuses with fluid within the nasopharynx. Patient is intubated. Mastoid air cells remain largely clear. Other: None. MRA HEAD FINDINGS Anterior circulation: Visualized distal cervical segments of the internal carotid arteries remain patent with antegrade flow. Petrous segments remain widely patent. Atheromatous irregularity about both carotid siphons with associated moderate stenosis at the supraclinoid left ICA. A1 segments patent. Normal anterior communicating artery complex. Left ACA remains patent. Distal severe right A2/A3 stenosis noted (series 1, image 164), stable. Left M1 segment remains patent. Mild to moderate long segment right M1 stenosis again noted. Negative MCA bifurcations. No proximal MCA branch occlusion. Distal MCA branches remain perfused and symmetric, although demonstrate mild small vessel atheromatous irregularity. Posterior circulation: Heterogeneous atheromatous irregularity again noted about both proximal V4 segments as they course into the cranial vault. Multifocal moderate stenoses involving the proximal left V4 segment again noted. Left V4 remains patent to the takeoff of the left PICA which remains patent and well perfused. Occlusion of the distal left V4 segment beyond the left PICA again noted, stable. On the right, there has been interval placement of a vascular stent extending from the distal right V4 segment across the vertebrobasilar junction into the proximal basilar artery. This traverses the previously seen high-grade stenosis at this level. Grossly patent flow through the stent, although evaluation for possible intra stent stenosis limited by MRA. Stable and fairly robust flow seen within the basilar artery distally. Suspected short-segment fenestration noted at the distal basilar artery. Superior cerebellar arteries remain patent. Right PCA primarily supplied via the  basilar.  Left PCA supplied via the basilar as well as a robust left posterior communicating artery. Atheromatous disease involving both PCAs with associated severe bilateral P2 stenoses. PCAs remain patent to their distal aspects although demonstrate small vessel atheromatous irregularity. Anatomic variants: None significant. IMPRESSION: MRI HEAD IMPRESSION: 1. 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 Additionally, there are new scattered small volume ischemic infarcts involving the right cerebellum as well as the cortical aspects of the right greater than left occipital lobes. No associated mass effect. Single punctate focus of associated petechial hemorrhage at the right cerebellum as above. 2. Otherwise stable appearance of the brain with atrophy, chronic microvascular ischemic disease, and multiple chronic ischemic infarcts as above. MRA HEAD IMPRESSION: 1. Interval placement of a vascular stent extending from the distal right V4 segment across the vertebrobasilar junction into the proximal basilar artery. Grossly patent flow through the stent, although evaluation for possible intra stent stenosis is limited by MRA. Stable and fairly robust flow seen within the basilar artery distally. 2. Otherwise stable appearance of the intracranial circulation with moderate multifocal stenoses involving the proximal left V4 segment with distal left V4 occlusion, severe right A2/A3 stenosis, severe bilateral P2 stenoses, moderate supraclinoid left ICA stenosis, with mild to moderate long segment right M1 stenosis. Electronically Signed   By: Jeannine Boga M.D.   On: 08/05/2021 02:44   DG Abd 1 View  Result Date: 08/04/2021 CLINICAL DATA:  NG tube placement EXAM: ABDOMEN - 1 VIEW COMPARISON:  10/11/2012 FINDINGS: Tip of NG tube is seen in the region of the antrum of the stomach. Bowel gas pattern is  nonspecific. Pelvis is not included in its entirety. IMPRESSION: Tip of NG tube is seen in the stomach. Electronically Signed   By: Elmer Picker M.D.   On: 08/04/2021 17:32   DG Chest Port 1 View  Result Date: 08/04/2021 CLINICAL DATA:  Intubated EXAM: PORTABLE CHEST 1 VIEW COMPARISON:  08/04/2021 FINDINGS: Endotracheal tube tip is about 4.7 cm superior to the carina. Subsegmental atelectasis at the left base. Cardiomegaly. No consolidation, pleural effusion or pneumothorax IMPRESSION: 1. Endotracheal tube tip about 4.7 cm superior to the carina. 2. Cardiomegaly.  Subsegmental atelectasis at the left base. Electronically Signed   By: Donavan Foil M.D.   On: 08/04/2021 15:10   DG Chest Port 1 View  Result Date: 08/04/2021 CLINICAL DATA:  Endotracheal intubation EXAM: PORTABLE CHEST 1 VIEW COMPARISON:  Earlier same day FINDINGS: Endotracheal tube tip 6 cm above the carina. Cardiomegaly persists. Pulmonary venous hypertension, possibly with early interstitial edema. Right lateral chest not included on the image. No evidence of collapse or effusion. IMPRESSION: Endotracheal tube tip 6 cm above the carina. Electronically Signed   By: Nelson Chimes M.D.   On: 08/04/2021 13:42   ECHOCARDIOGRAM COMPLETE  Result Date: 08/04/2021    ECHOCARDIOGRAM REPORT   Patient Name:   JADDEN YIM Date of Exam: 08/03/2021 Medical Rec #:  [id number]   Height:       67.0 in Accession #:    [id number]  Weight:       185.0 lb Date of Birth:  02/12/1955   BSA:          1.956 m Patient Age:    2 years    BP:           142/79 mmHg Patient Gender: M  HR:           76 bpm. Exam Location:  [facility] Procedure: 2D Echo, Cardiac Doppler and Color Doppler Indications:     I63.9 Stroke  History:         Patient has no prior history of Echocardiogram examinations.                  Risk Factors:Diabetes, Dyslipidemia, Hypertension and Former                  Smoker.  Sonographer:     Rosalia Hammers Referring Phys:  [id number] Athena Masse Diagnosing Phys: [place]  1. Left ventricular ejection fraction, by estimation, is 60 to 65%. The left ventricle has normal function. The left ventricle has no regional wall motion abnormalities. There is moderate concentric left ventricular hypertrophy. Left ventricular diastolic parameters are consistent with Grade I diastolic dysfunction (impaired relaxation).  2. Right ventricular systolic function is normal. The right ventricular size is normal.  3. The mitral valve is normal in structure. Trivial mitral valve regurgitation. No evidence of mitral stenosis.  4. The aortic valve is normal in structure. Aortic valve regurgitation is mild. No aortic stenosis is present.  5. The inferior vena cava is normal in size with greater than 50% respiratory variability, suggesting right atrial pressure of 3 mmHg. FINDINGS  Left Ventricle: Left ventricular ejection fraction, by estimation, is 60 to 65%. The left ventricle has normal function. The left ventricle has no regional wall motion abnormalities. The left ventricular internal cavity size was normal in size. There is  moderate concentric left ventricular hypertrophy. Left ventricular diastolic parameters are consistent with Grade I diastolic dysfunction (impaired relaxation). Right Ventricle: The right ventricular size is normal. No increase in right ventricular wall thickness. Right ventricular systolic function is normal. Left Atrium: Left atrial size was normal in size. Right Atrium: Right atrial size was normal in size. Pericardium: There is no evidence of pericardial effusion. Mitral Valve: The mitral valve is normal in structure. Trivial mitral valve regurgitation. No evidence of mitral valve stenosis. Tricuspid Valve: The tricuspid valve is normal in structure. Tricuspid valve regurgitation is trivial. No evidence of tricuspid stenosis. Aortic Valve: The aortic valve is normal in structure. Aortic valve regurgitation is mild. Aortic  regurgitation PHT measures 1577 msec. No aortic stenosis is present. Aortic valve mean gradient measures 5.0 mmHg. Aortic valve peak gradient measures 9.1 mmHg. Aortic valve area, by VTI measures 2.30 cm. Pulmonic Valve: The pulmonic valve was normal in structure. Pulmonic valve regurgitation is not visualized. No evidence of pulmonic stenosis. Aorta: The aortic root is normal in size and structure. Venous: The inferior vena cava is normal in size with greater than 50% respiratory variability, suggesting right atrial pressure of 3 mmHg. IAS/Shunts: No atrial level shunt detected by color flow Doppler.  LEFT VENTRICLE PLAX 2D LVIDd:         3.84 cm   Diastology LVIDs:         2.17 cm   LV e' medial:    6.39 cm/s LV PW:         1.65 cm   LV E/e' medial:  9.6 LV IVS:        1.18 cm   LV e' lateral:   7.83 cm/s LVOT diam:     1.90 cm   LV E/e' lateral: 7.8 LV SV:         74 LV SV Index:   38 LVOT Area:  2.84 cm  RIGHT VENTRICLE RV Basal diam:  3.15 cm RV S prime:     17.40 cm/s TAPSE (M-mode): 2.4 cm LEFT ATRIUM             Index        RIGHT ATRIUM           Index LA diam:        3.20 cm 1.64 cm/m   RA Area:     18.10 cm LA Vol (A2C):   72.4 ml 37.01 ml/m  RA Volume:   47.10 ml  24.08 ml/m LA Vol (A4C):   65.8 ml 33.63 ml/m LA Biplane Vol: 69.3 ml 35.42 ml/m  AORTIC VALVE AV Area (Vmax):    2.10 cm AV Area (Vmean):   2.06 cm AV Area (VTI):     2.30 cm AV Vmax:           151.00 cm/s AV Vmean:          100.000 cm/s AV VTI:            0.323 m AV Peak Grad:      9.1 mmHg AV Mean Grad:      5.0 mmHg LVOT Vmax:         112.00 cm/s LVOT Vmean:        72.600 cm/s LVOT VTI:          0.262 m LVOT/AV VTI ratio: 0.81 AI PHT:            1577 msec  AORTA Ao Root diam: 3.20 cm MITRAL VALVE MV Area (PHT): 3.20 cm     SHUNTS MV Decel Time: 237 msec     Systemic VTI:  0.26 m MV E velocity: 61.30 cm/s   Systemic Diam: 1.90 cm MV A velocity: 101.00 cm/s MV E/A ratio:  0.61 Shaukat Khan Electronically signed by Neoma Laming  Signature Date/Time: 08/04/2021/8:55:17 AM    Final    MR BRAIN WO CONTRAST  Result Date: 08/03/2021 CLINICAL DATA:  66 year old male with severe intracranial atherosclerosis and questionable medullary ischemia on brain MRI yesterday. EXAM: MRI HEAD WITHOUT CONTRAST TECHNIQUE: Multiplanar, multiecho pulse sequences of the brain and surrounding structures were obtained without intravenous contrast. COMPARISON:  CTA head and neck this morning.  Brain MRI yesterday. FINDINGS: Brain: More convincing appearance of ventral medullary restricted diffusion, asymmetrically greater to the left of midline. See series 5, image 12, series 7, images 18 and 19. Mild if any associated T2 and FLAIR hyperintensity there. And no other convincing diffusion restriction despite the CTA findings earlier today. 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 Chronic encephalomalacia right inferior frontal gyrus with hemosiderin. Occasional chronic microhemorrhages elsewhere, right cerebellum (series 13, image 19). No new signal abnormality compared to yesterday. No midline shift, mass effect, evidence of mass lesion, ventriculomegaly, extra-axial collection or acute intracranial hemorrhage. Cervicomedullary junction and pituitary are within normal limits. Vascular: Major intracranial vascular flow voids are stable from yesterday. See also CTA today reported separately. Skull and upper cervical spine: Negative. Visualized bone marrow signal is within normal limits. Sinuses/Orbits: Stable, negative. Other: Visible internal auditory structures appear normal. Mastoids are clear. Negative visible scalp and face. IMPRESSION: 1. Positive for ischemia/infarction of the ventral Medulla as suspected yesterday, slightly greater on the left. No associated hemorrhage or mass effect. 2. Otherwise stable noncontrast MRI appearance of the brain since yesterday, chronic small vessel disease and chronic  hemorrhagic encephalomalacia of the right inferior frontal  gyrus. These results were communicated to Dr. Rory Percy at 11:06 am on 08/03/2021 by text page via the [facility] messaging system. Electronically Signed   By: Genevie Ann M.D.   On: 08/03/2021 11:06   CT ANGIO HEAD NECK W WO CM  Addendum Date: 08/03/2021   ADDENDUM REPORT: 08/03/2021 09:17 ADDENDUM: Study discussed by telephone with Dr. Amie Portland on 08/03/2021 at 0911 hours. Electronically Signed   By: Genevie Ann M.D.   On: 08/03/2021 09:17   Result Date: 08/03/2021 CLINICAL DATA:  66 year old male with neurologic deficit. Questionable small vessel ischemia of the medulla on brain MRI yesterday. EXAM: CT ANGIOGRAPHY HEAD AND NECK TECHNIQUE: Multidetector CT imaging of the head and neck was performed using the standard protocol during bolus administration of intravenous contrast. Multiplanar CT image reconstructions and MIPs were obtained to evaluate the vascular anatomy. Carotid stenosis measurements (when applicable) are obtained utilizing NASCET criteria, using the distal internal carotid diameter as the denominator. RADIATION DOSE REDUCTION: This exam was performed according to the departmental dose-optimization program which includes automated exposure control, adjustment of the mA and/or kV according to patient size and/or use of iterative reconstruction technique. CONTRAST:  32m OMNIPAQUE IOHEXOL 350 MG/ML SOLN COMPARISON:  Brain MRI yesterday.  Head CT yesterday. FINDINGS: CT HEAD Brain: Bulky Calcified atherosclerosis at the skull base. Stable CT appearance of the brainstem since yesterday. No acute intracranial hemorrhage identified. No midline shift, mass effect, or evidence of intracranial mass lesion. Chronic right inferior frontal gyrus encephalomalacia, patchy bilateral cerebral white matter hypodensity more pronounced on the left. 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 No ventriculomegaly or acute cortically based infarct.  Calvarium and skull base: No acute osseous abnormality identified. Paranasal sinuses: Visualized paranasal sinuses and mastoids are stable and well aerated. Orbits: Visualized orbits and scalp soft tissues are within normal limits. CTA NECK Skeleton: Periapical dental lucency bilaterally. Ordinary cervical spine degeneration. No acute osseous abnormality identified. Upper chest: Centrilobular and paraseptal emphysema. No superior mediastinal lymphadenopathy. Other neck: 15 mm hypodense right thyroid nodule. Recommend thyroid [place](ref: J Am Coll Radiol. 2015 Feb;12(2): 143-50).Dermal and subcutaneous probable 2.6 cm sebaceous cyst of the left submandibular face (series 13, image 79). Otherwise negative CT appearance of the neck soft tissues. Aortic arch: 3 vessel arch configuration. Mild arch atherosclerosis. Right carotid system: Negative brachiocephalic artery and proximal right CCA. Retropharyngeal course of the right carotid bifurcation with mild calcified plaque. Tortuous right ICA distal to the bulb, no stenosis. Left carotid system: Similar mild tortuosity and atherosclerosis with no hemodynamically significant stenosis. Vertebral arteries: Tortuous proximal right subclavian artery with a mildly kinked appearance in the superior mediastinum. Normal right vertebral artery origin. Patent right vertebral artery to the skull base with no significant plaque or stenosis. Mild to moderate soft and calcified plaque in the proximal left subclavian artery with less than 50 % stenosis with respect to the distal vessel. Mild atherosclerosis and stenosis at the left vertebral artery origin on series 13, image 159. Non dominant left vertebral artery than is patent to the skull base with no additional plaque or stenosis. CTA HEAD Posterior circulation: Bilateral V4 segment calcified plaque. Severe stenosis on the left (series 12, image 139) upstream of the left PICA origin which remains patent. But the left V4 segment is  occluded distal to the left PICA. Contralateral right V4 moderate tandem stenoses upstream of the patent right PICA origin on series 12, image 130. And severe stenosis, near occlusion of the right vertebrobasilar junction (series  16, image 26). The basilar artery remains patent but is moderately stenotic in the mid basilar segment on series 16, image 24. Distal basilar remains patent along with SCA and PCA origins. Left posterior communicating artery is present, the right is diminutive or absent. a mild to moderate stenosis left PCA origin. And bilateral P2 segment severe stenosis, near occlusion on series 15, image 53. But there is preserved distal PCA branch enhancement bilaterally. Anterior circulation: Both ICA siphons are patent. However, on the left there is bulky soft plaque or thrombus in the supraclinoid ICA (series 14, image 162) this is between normal appearing left ophthalmic and posterior communicating artery origins. Left carotid terminus remains patent. Contralateral right ICA siphon with mild to moderate calcified plaque and mild supraclinoid stenosis. Patent carotid termini, MCA and ACA origins. Tortuous A1 segments. Normal anterior communicating artery. Proximal A2 segments are normal. There is severe right ACA distal A2 segment stenosis, near occlusion on series 17, image 32. Left MCA M1 segment is mildly irregular. Left MCA bifurcation is patent without stenosis. Left MCA branches are mildly irregular. Mild to moderate somewhat long segment right MCA M1 stenosis along 6 mm series 16, image 20. Distal M1 and right MCA trifurcation remain patent. Right MCA branches are mildly irregular. Venous sinuses: Early contrast timing, grossly patent. Anatomic variants: Mildly dominant right vertebral artery. Review of the MIP images confirms the above findings IMPRESSION: 1. Negative for bona fide large vessel occlusion, but positive for Severe intracranial atherosclerosis with: - Near Occlusion  Vertebrobasilar junction, occluded Distal Left V4 segment. - Moderate stenosis Mid Basilar Artery. - Near Occlusion bilateral PCA P2 segments, distal right ACA A2. - bulky soft plaque or thrombus in the Supraclinoid Left ICA, with up to Moderate stenosis. - mild-to-moderate Right MCA M1 stenosis. 2. Stable CT appearance of the brain since yesterday. No new intracranial abnormality. 3. Aortic Atherosclerosis (ICD10-I70.0) and Emphysema (ICD10-J43.9). Electronically Signed: By: Genevie Ann M.D. On: 08/03/2021 09:01   US Carotid Bilateral (at [facility] and [facility] only)  Result Date: 08/03/2021 CLINICAL DATA:  Follow-up examination for stroke. EXAM: BILATERAL CAROTID DUPLEX ULTRASOUND TECHNIQUE: Pearline Cables scale imaging, color Doppler and duplex ultrasound were performed of bilateral carotid and vertebral arteries in the neck. COMPARISON:  Prior Study from 04/03/2014. FINDINGS: Criteria: Quantification of carotid stenosis is based on velocity parameters that correlate the residual internal carotid diameter with NASCET-based stenosis levels, using the diameter of the distal internal carotid lumen as the denominator for stenosis measurement. The following velocity measurements were obtained: RIGHT ICA: 84/10 cm/sec CCA: 24/09 cm/sec SYSTOLIC ICA/CCA RATIO:  1.1 ECA: 64 cm/sec LEFT ICA: 64/16 cm/sec CCA: 735/32 cm/sec SYSTOLIC ICA/CCA RATIO:  0.9 ECA: 117 cm/sec RIGHT CAROTID ARTERY: Mild smooth intimal thickening seen within the visualized right CCA without significant stenosis. Mild heterogeneous mural based echogenic plaque seen about the right carotid bulb. No significant elevation in peak systolic velocity to suggest hemodynamically significant greater than 50% stenosis. Visualized right ICA patent distally without stenosis. RIGHT VERTEBRAL ARTERY:  Patent with antegrade flow. LEFT CAROTID ARTERY: Diffuse smooth intimal thickening seen within the visualized left CCA without significant stenosis. Mild scattered heterogeneous  echogenic plaque about the left carotid bulb/proximal left ICA. No significant elevation in peak systolic velocity to suggest hemodynamically significant greater than 50% stenosis. Visualized left ICA patent distally without visible stenosis. LEFT VERTEBRAL ARTERY:  Patent with antegrade flow. IMPRESSION: 1. Mild atherosclerotic plaque within the carotid bulbs and proximal ICAs bilaterally, but no hemodynamically significant greater than 50% stenosis.  2. Patent antegrade flow within both vertebral arteries. Electronically Signed   By: Jeannine Boga M.D.   On: 08/03/2021 03:26   MR Cervical Spine Wo Contrast  Result Date: 08/03/2021 CLINICAL DATA:  Initial evaluation for ataxia, right-sided numbness. EXAM: MRI CERVICAL SPINE WITHOUT CONTRAST TECHNIQUE: Multiplanar, multisequence MR imaging of the cervical spine was performed. No intravenous contrast was administered. COMPARISON:  None available. FINDINGS: Alignment: Examination degraded by motion artifact. Straightening with mild reversal of the normal cervical lordosis. No listhesis. Vertebrae: Vertebral body height maintained without acute or chronic fracture. Bone marrow signal intensity within normal limits. No discrete or worrisome osseous lesions or abnormal marrow edema. Cord: Normal signal and morphology. No convincing cord signal changes on this motion degraded exam. Posterior Fossa, vertebral arteries, paraspinal tissues: Unremarkable. Disc levels: C2-C3: Unremarkable. C3-C4: Mild disc bulge with endplate and uncovertebral spurring. Flattening and partial effacement of the ventral thecal sac with resultant mild spinal stenosis. Moderate bilateral C4 foraminal narrowing. C4-C5: Mild disc bulge with uncovertebral spurring. Mild flattening of the ventral thecal sac without significant spinal stenosis. Moderate right with mild left C5 foraminal stenosis. C5-C6: Minimal disc bulge with uncovertebral spurring. No spinal stenosis. Mild right C6  foraminal narrowing. Left neural foramen remains patent. C6-C7: Minimal annular disc bulge. No spinal stenosis. Foramina remain patent. C7-T1:  Unremarkable. Visualized upper thoracic spine demonstrates no significant finding. IMPRESSION: 1. Motion degraded exam. 2. No acute abnormality within the cervical spine. 3. Mild multilevel cervical spondylosis with resultant mild spinal stenosis at C3-4. Associated moderate bilateral C4 foraminal stenosis, with mild right C5 and C6 foraminal narrowing. Electronically Signed   By: Jeannine Boga M.D.   On: 08/03/2021 01:04   MR BRAIN WO CONTRAST  Result Date: 08/03/2021 CLINICAL DATA:  Initial evaluation for neuro deficit, stroke suspected. EXAM: MRI HEAD WITHOUT CONTRAST TECHNIQUE: Multiplanar, multiecho pulse sequences of the brain and surrounding structures were obtained without intravenous contrast. COMPARISON:  Prior CT from earlier the same day as well as previous MRI from 09/17/2015. FINDINGS: Brain: Generalized age-related cerebral atrophy. Patchy T2/FLAIR hyperintensity involving the periventricular and deep white matter both cerebral hemispheres as well as the pons, most consistent with chronic small vessel ischemic disease, moderately advanced in nature. Area of encephalomalacia of involving parasagittal anterior/inferior right frontal lobe noted at site of prior hemorrhage. Additional smaller foci of encephalomalacia involving the inferior temporal poles bilaterally likely posttraumatic in nature. Few scattered small remote left cerebellar infarcts noted. Subtle area of diffusion signal abnormality involving the ventral medulla measuring up to approximately 8 mm is seen (a series 5, image 12). While this finding could potentially be artifactual, possibly due to prominent vascular calcifications about the adjacent vertebral arteries, a possible small acute ischemic infarct could be considered. No associated hemorrhage or mass effect. No other evidence  for acute or subacute ischemia. No acute intracranial hemorrhage. Single chronic microhemorrhage noted within the right cerebellum, of doubtful significance in isolation. No mass lesion, midline shift or mass effect. No hydrocephalus or extra-axial fluid collection. Pituitary gland and suprasellar region within normal limits. Vascular: Major intracranial vascular flow voids are maintained. Skull and upper cervical spine: Craniocervical junction within normal limits. Bone marrow signal intensity normal. No scalp soft tissue abnormality. Sinuses/Orbits: Globes and orbital soft tissues within normal limits. Paranasal sinuses are largely clear. No mastoid effusion. Other: None. IMPRESSION: 1. 8 mm focus of diffusion signal abnormality involving the ventral medulla, indeterminate. While this finding could potentially be artifactual in nature, a possible small acute ischemic  infarct is difficult to exclude, and could be considered in the correct clinical setting. No associated hemorrhage or mass effect. 2. No other acute intracranial abnormality. 3. Underlying atrophy with moderate chronic small vessel ischemic disease with a few small remote left cerebellar infarcts. Area of encephalomalacia of involving the anterior/inferior right frontal lobe at site of previous hemorrhage. Electronically Signed   By: Jeannine Boga M.D.   On: 08/03/2021 00:59   CT Head Wo Contrast  Result Date: 08/02/2021 CLINICAL DATA:  New right upper extremity tremor. Tingling to the arm. EXAM: CT HEAD WITHOUT CONTRAST TECHNIQUE: Contiguous axial images were obtained from the base of the skull through the vertex without intravenous contrast. RADIATION DOSE REDUCTION: This exam was performed according to the departmental dose-optimization program which includes automated exposure control, adjustment of the mA and/or kV according to patient size and/or use of iterative reconstruction technique. COMPARISON:  MRI brain 09/17/2015 FINDINGS:  Brain: Mild cerebral atrophy. Mild ventricular dilatation consistent with central atrophy. Patchy low-attenuation changes in the deep white matter consistent small vessel ischemic change. Old area of encephalomalacia in the right anterior frontal lobe corresponding to previous intraparenchymal hemorrhage on prior MRI. No acute mass effect or midline shift. No abnormal extra-axial fluid collections. Gray-white matter junctions are distinct. Basal cisterns are not effaced. No acute intracranial hemorrhage. Vascular: No hyperdense vessel or unexpected calcification. Skull: Normal. Negative for fracture or focal lesion. Sinuses/Orbits: Paranasal sinuses and mastoid air cells are clear. Other: None. IMPRESSION: No acute intracranial abnormalities. Chronic atrophy and small vessel ischemic changes. 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 Electronically Signed   By: Lucienne Capers M.D.   On: 08/02/2021 21:20   DG Foot Complete Right  Result Date: 08/02/2021 CLINICAL DATA:  Chronic RIGHT arm and leg pain for since Sunday, RIGHT leg cold to touch, swelling RIGHT great toe EXAM: RIGHT FOOT COMPLETE - 3+ VIEW COMPARISON:  None Available. FINDINGS: Osseous mineralization normal. 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 Remaining joint spaces preserved. No acute fracture, dislocation, or bone destruction. Small plantar calcaneal spur. IMPRESSION: Degenerative changes first MTP joint and small plantar calcaneal spur. No acute osseous abnormalities. Electronically Signed   By: Lavonia Dana M.D.   On: 08/02/2021 20:51     PHYSICAL EXAM  Temp:  [98.3 F (36.8 C)-99.7 F (37.6 C)] 99.7 F (37.6 C) (07/29 0800) Pulse Rate:  [66-117] 79 (07/29 0800) Resp:  [15-30] 26 (07/29 0800) BP: (114-218)/(64-98) 159/84 (07/29 0800) SpO2:  [96 %-100 %] 100 % (07/29 0800) Arterial Line BP: (100-206)/(69-100) 174/74 (07/28 1600) FiO2 (%):  [40 %] 40 % (07/29 0757) Weight:  [89.1 kg] 89.1  kg (07/29 0500)  General - Well nourished, well developed, intubated on Precedex in the ICU. Respiratory- Oral ETT in place, midline. Moderate amount of clear secretions present with inline suctioning. Cough present with suctioning.  Cardiovascular - Regular rate and rhythm.  Neuro -On precedex for anxiety, eyes open, following midline commands, eyes in mid position but will track throughout visual fields, blinking to visual threat, PERRL. Corneal reflex present, gag and cough present. Breathing over the set ventilator rate.  Bilateral nystagmus present, worse in rightward gaze. Communicates with blinks. One blink indicates "yes" answer and two blinks indicates a "no" answer. On pain stimulation, no movement in all extremities. No babinski. Patient endorses sensation to light touch intact and symmetric throughout extremities. Patient is unable to participate in FNF and HKS.  ASSESSMENT/PLAN Mr. Najeeb Uptain is a 66 y.o.  male with history of remote TBI with ICH, hypertension, hyperlipidemia, diabetes admitted for right leg/foot weakness and pain, right hand numbness, imbalance.  Gradually developed left leg weakness also during admission.  No tPA given due to outside window.  Quadriplegia on examination, attempt at extubation failed with immediate reintubation. Trach and PEG done 7/28.   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  Ongoing aggressive stroke risk  factor management Therapy recommendations: SNF vs. 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 Grossly patent flow through the stent, although evaluation for possible intra stent stenosis is limited by MRA  Respiratory failure Reintubated right after extubation Trach 7/28 Weaning on ventilator- PCCM is primary -Concern for evolving pneumonia on 5-day course of ceftriaxone  Diabetes HgbA1c 8.3 goal < 7.0 Controlled CBG monitoring SSI DM education and close PCP follow up  Hypertension Unstable BP goal < 180/105 Intermittently on Cleviprex; labile blood pressures with inline suctioning and stimulation Long term BP goal normotensive  Hyperlipidemia Home meds: Lipitor 20 LDL 116, goal < 70 Now on Lipitor 80 Continue statin at discharge  Other Stroke Risk Factors Advanced age Former cigarette smoker quit smoking 33 years ago ETOH use, 1 drink per day  Other Active Problems Remote TBI with traumatic [facility] day # 4  -- Patient seen and examined by NP/APP with MD. MD to update note as needed.   Janine Ores, DNP, FNP-BC [facility] Pager: [phone]  ATTENDING ATTESTATION:  66 year old status post basilar artery with vertebral basilar junction stenting.  On Brilinta and aspirin.  Status post trach and PEG yesterday.  Quadriplegic/aphasic on exam but can blink yes/ no answers.  Still on Cleviprex drip.  On lisinopril 40 mg and metoprolol 12.5 twice daily. Will add norvasc '5mg'$  today in efforts to taper off cleviprex ggt. CCM weaning vent.  Dr. Reeves Forth evaluated pt  independently, reviewed imaging, chart, labs. Discussed and formulated plan with the APP. Please see APP note above for details.      This patient is critically ill due to brainstem stroke with now quadriplegic, respite failure and at significant risk of neurological worsening, death form 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 This patient's care requires constant monitoring of vital signs, hemodynamics, respiratory and cardiac monitoring, review of multiple databases, neurological assessment, discussion with family, other specialists and medical decision making of high complexity. I spent 35 minutes of neurocritical care time in the care of this patient.  Discussed plans with CCM and interventional radiology  Emsley Custer,MD  To contact Stroke Continuity provider, please refer to [email or web address] After hours, contact General Neurology

## 2021-08-08 DIAGNOSIS — I639 Cerebral infarction, unspecified: Secondary | ICD-10-CM | POA: Diagnosis not present

## 2021-08-08 LAB — CULTURE, RESPIRATORY W GRAM STAIN

## 2021-08-08 LAB — BASIC METABOLIC PANEL
Anion gap: 10 (ref 5–15)
BUN: 14 mg/dL (ref 8–23)
CO2: 25 mmol/L (ref 22–32)
Calcium: 8.6 mg/dL — ABNORMAL LOW (ref 8.9–10.3)
Chloride: 106 mmol/L (ref 98–111)
Creatinine, Ser: 0.69 mg/dL (ref 0.61–1.24)
GFR, Estimated: >60 mL/min (ref >60)
Glucose, Bld: 258 mg/dL — ABNORMAL HIGH (ref 70–99)
Potassium: 3.8 mmol/L (ref 3.5–5.1)
Sodium: 141 mmol/L (ref 135–145)

## 2021-08-08 LAB — GLUCOSE, CAPILLARY
Glucose-Capillary: 251 mg/dL — ABNORMAL HIGH (ref 70–99)
Glucose-Capillary: 297 mg/dL — ABNORMAL HIGH (ref 70–99)
Glucose-Capillary: 215 mg/dL — ABNORMAL HIGH (ref 70–99)
Glucose-Capillary: 229 mg/dL — ABNORMAL HIGH (ref 70–99)
Glucose-Capillary: 312 mg/dL — ABNORMAL HIGH (ref 70–99)
Glucose-Capillary: 252 mg/dL — ABNORMAL HIGH (ref 70–99)

## 2021-08-08 MED ORDER — HYDRALAZINE HCL 20 MG/ML IJ SOLN
2.0000 mg | Freq: Once | INTRAMUSCULAR | Status: DC
Start: 2021-08-08 — End: 2021-08-08

## 2021-08-08 MED ORDER — CLONIDINE ORAL SUSPENSION 10 MCG/ML
0.1000 mg | Freq: Three times a day (TID) | ORAL | Status: DC
  Filled 2021-08-08 (×3): qty 10

## 2021-08-08 MED ORDER — ORAL CARE MOUTH RINSE: 15.0000 mL | OROMUCOSAL | Status: DC | PRN

## 2021-08-08 MED ORDER — SENNOSIDES-DOCUSATE SODIUM 8.6-50 MG PO TABS
1.0000 | ORAL_TABLET | Freq: Every evening | ORAL | Status: DC | PRN
  Administered 2021-09-05 – 2021-09-18 (×8): 1
  Filled 2021-08-08 (×8): qty 1

## 2021-08-08 MED ORDER — INSULIN DETEMIR 100 UNIT/ML ~~LOC~~ SOLN
10.0000 [IU] | Freq: Two times a day (BID) | SUBCUTANEOUS | Status: DC
Start: 2021-08-08 — End: 2021-08-09
  Administered 2021-08-08 – 2021-08-09 (×3): 10 [IU] via SUBCUTANEOUS
  Filled 2021-08-08 (×4): qty 0.1

## 2021-08-08 MED ORDER — METOPROLOL TARTRATE 25 MG PO TABS
25.0000 mg | ORAL_TABLET | Freq: Two times a day (BID) | ORAL | Status: DC
  Administered 2021-08-08 – 2021-08-13 (×12): 25 mg
  Filled 2021-08-08 (×12): qty 1

## 2021-08-08 MED ORDER — CLONIDINE HCL 0.1 MG PO TABS
0.1000 mg | ORAL_TABLET | Freq: Three times a day (TID) | ORAL | Status: DC
Start: 2021-08-08 — End: 2021-09-08
  Administered 2021-08-08 – 2021-09-08 (×92): 0.1 mg
  Filled 2021-08-08 (×92): qty 1

## 2021-08-08 MED ORDER — HYDRALAZINE HCL 20 MG/ML IJ SOLN
10.0000 mg | Freq: Four times a day (QID) | INTRAMUSCULAR | Status: DC | PRN
  Administered 2021-08-08 – 2021-09-13 (×9): 10 mg via INTRAVENOUS
  Filled 2021-08-08 (×9): qty 1

## 2021-08-08 MED ORDER — HYDROCHLOROTHIAZIDE 12.5 MG PO TABS
12.5000 mg | ORAL_TABLET | Freq: Every day | ORAL | Status: DC
  Administered 2021-08-08 – 2021-09-20 (×44): 12.5 mg
  Filled 2021-08-08 (×44): qty 1

## 2021-08-08 MED ORDER — SODIUM CHLORIDE 0.9 % IV SOLN
2.0000 g | Freq: Three times a day (TID) | INTRAVENOUS | Status: DC
  Administered 2021-08-08 – 2021-08-09 (×3): 2 g via INTRAVENOUS
  Filled 2021-08-08 (×3): qty 12.5

## 2021-08-08 MED ORDER — OXYCODONE HCL 5 MG/5ML PO SOLN
5.0000 mg | Freq: Four times a day (QID) | ORAL | Status: DC | PRN
  Administered 2021-08-08: 5 mg
  Administered 2021-08-09 – 2021-08-16 (×4): 10 mg
  Administered 2021-08-17: 5 mg
  Administered 2021-08-17 – 2021-08-20 (×2): 10 mg
  Administered 2021-08-21 – 2021-08-26 (×3): 5 mg
  Administered 2021-08-26 – 2021-08-27 (×3): 10 mg
  Administered 2021-08-28: 5 mg
  Administered 2021-08-28: 10 mg
  Administered 2021-09-09 – 2021-09-13 (×2): 5 mg
  Filled 2021-08-08 (×3): qty 10
  Filled 2021-08-08: qty 5
  Filled 2021-08-08 (×6): qty 10
  Filled 2021-08-08 (×2): qty 5
  Filled 2021-08-08 (×2): qty 10
  Filled 2021-08-08: qty 5
  Filled 2021-08-08: qty 10
  Filled 2021-08-08 (×3): qty 5

## 2021-08-08 MED ORDER — AMLODIPINE BESYLATE 10 MG PO TABS
10.0000 mg | ORAL_TABLET | Freq: Every day | ORAL | Status: DC
Start: 2021-08-08 — End: 2021-11-26
  Administered 2021-08-08 – 2021-11-26 (×111): 10 mg
  Filled 2021-08-08 (×110): qty 1

## 2021-08-08 MED ORDER — INSULIN ASPART 100 UNIT/ML IJ SOLN
4.0000 [IU] | INTRAMUSCULAR | Status: DC
  Administered 2021-08-08 – 2021-08-09 (×7): 4 [IU] via SUBCUTANEOUS

## 2021-08-08 MED ORDER — ORAL CARE MOUTH RINSE
15.0000 mL | OROMUCOSAL | Status: DC
  Administered 2021-08-08 – 2021-10-31 (×969): 15 mL via OROMUCOSAL

## 2021-08-08 NOTE — Progress Notes (Signed)
NAME:  Joshua Donovan, MRN:  [id number], DOB:  02-04-1955, LOS: 5 ADMISSION DATE:  08/03/2021, CONSULTATION DATE:  08/04/21 REFERRING MD:  Dr. Gerhard Perches, CHIEF COMPLAINT:  CVA   History of Present Illness:  66 year old male with prior history of HTN, TBI with prior ICH, DM, and HLD who presented to [facility] 7/24 with right hand numbness and right LE pain and weakness.  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  He was admitted for further stroke workup.  His weakness worsened to include right arm, right leg and left leg.  CTA angio head revealed multiple intracranial severe stenoses, near- occlusive stenosis of the vertebrobasilar junction, occlusion of the distal left V4, moderate mid basilar stenosis, nearly occlusive bilateral PCA P2 segments, nearly occlusive distal right ACA A2 segments and bulky soft plaque or thrombus in the supraclinoid left ICA with mild to moderate right M1 stenosis.  He was transferred to [facility] for further stroke care and Neuro IR evaluation on 7/25.  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  Additionally noted occlusion of left V4 distal to origin of PICA and patent bilateral P-comm arteries.  Heparin gtt ordered as well as plavix.  Remains on cleviprex gtt for strict SBP parameters.  Patient left intubated but weaning in PACU while awaiting ICU bed.  PCCM consulted for further ventilator management.    Pertinent  Medical History  TBI with ICH, DM, HTN, HLD  Significant Hospital Events: Including procedures, antibiotic start and stop dates in addition to other pertinent events   7/24 presented to [facility], ventral medulla CVA 7/25 tx to [facility] 7/26 cerebral angiogram with stent placement to right vertebro-basilar junction, failed extubation, left radial aline MRI brain> mild extension of stroke, now involving bilateral medial medullary, patent basilar artery and R  VBJ stent  7/28 underwent bedside percutaneous tracheostomy and PEG tube placement, tolerated well 7/29 no acute issues overnight currently on SBT trial this a.m. and tolerating well, Cleviprex resumed earlier this a.m. due to severe hypertension 7/30 Hypertension persist despite adding oral agents, glucose remains elevated as well    Interim History / Subjective:  Alert and able to communicate by blinking  Good urine output   Objective   Blood pressure (!) 185/92, pulse 70, temperature 98.8 F (37.1 C), temperature source Oral, resp. rate 19, height _0  (1.702 m), weight 89.1 kg, SpO2 100 %.    Vent Mode: PRVC FiO2 (%):  [40 %] 40 % Set Rate:  [18 bmp] 18 bmp Vt Set:  [520 mL] 520 mL PEEP:  [5 cmH20] 5 cmH20 Pressure Support:  [10 cmH20] 10 cmH20 Plateau Pressure:  [5 cmH20-15 cmH20] 15 cmH20   Intake/Output Summary (Last 24 hours) at 08/08/2021 0714 Last data filed at 08/08/2021 0600 Gross per 24 hour  Intake 2563.06 ml  Output 2300 ml  Net 263.06 ml    Filed Weights   08/04/21 0914 08/07/21 0500  Weight: 83.9 kg 89.1 kg   Examination: General: Acute on chronically ill appearing middle aged male lying in bed on mechanical ventilation, in NAD HEENT: 8 cuffed shiley trach midline, MM pink/moist, PERRL,  Neuro: Alert and able to communicate by blinking, quadriplegic  CV: s1s2 regular rate and rhythm, no murmur, rubs, or gallops,  PULM:  Clear to ascultation, no increased work of breathing, no added breath sounds, tolerating vent GI: soft, bowel sounds active in all 4 quadrants, non-tender, non-distended,  tolerating TF Extremities: warm/dry, no edema  Skin: no rashes or lesions  Resolved Hospital Problem list    Assessment & Plan:   Acute respiratory insufficiency related to below -Underwent percutaneous tracheostomy and PEG tube placement 7/28 Concern for evolving pneumonia -7/28 patient was seen with increased tracheal secretions resulting in BAL brain performed on  tracheostomy and empiric antibiotics started P: Continue ventilator support with lung protective strategies  Wean PEEP and FiO2 for sats greater than 90%. Head of bed elevated 30 degrees. Plateau pressures less than 30 cm H20.  Follow intermittent chest x-ray and ABG.   SAT/SBT as tolerated, mentation preclude extubation  Ensure adequate pulmonary hygiene  Follow cultures  VAP bundle in place  PAD protocol Routine trach care  Follow sputum culture  Continue Ceftriaxone   Brainstem stroke involving the ventral medulla with basilar artery stensois s/p stent placement to right vertebro-basilar junction  Severe intracranial atherosclerosis -Continues with ongoing quadriplegia P: Management per neurology  Maintain neuro protective measures; goal for eurothermia, euglycemia, eunatermia, normoxia, and PCO2 goal of 35-40 Nutrition and bowel regiment  Seizure precautions  Aspirations precautions  SBP goal less than 180 Continue Briinta/ASA Will need LTACH vs SNF depending on vent wean   HTN emergency Grade 1 diastolic dysfunction seen on echocardiogram -TTE EF 60-65%, no WMA, G1DD, normal RV/ valves History of hyperlipidemia P: Continue home statin  Brilinita/ASA as above  Continue home Lisinopril and lopressors  Added Norvasc and increased dose 7/30  DM -A1c 8.3 P: Continue resistant SSI scale  Add TF coverage  Increase Levemir CBG goal 140-180  ?hx of ETOH abuse - reported 1 beer a day P: CIWA protocol  Monitor for withdrawal  Supplement thiamin, folate, and multivitamin   Best Practice (right click and "Reselect all SmartList Selections" daily)   Diet/type: NPO;  TF per RD recs> restart 4hrs post PEG.  Scheduled bowel regimen DVT prophylaxis: prophylactic heparin  GI prophylaxis: PPI Lines: N/A Foley:  Yes, and it is no longer needed and removal ordered > frequent bladder scans to monitor for retention Code Status:  full code Last date of multidisciplinary  goals of care discussion: No family at bedside a.m. of 7/30 will call for daily update   Critical care time: 37 mins  Avielle Imbert D. Kenton Kingfisher, NP-C [facility] Personal contact information can be found on Amion  08/08/2021, 7:14 AM

## 2021-08-08 NOTE — Plan of Care (Signed)
  Problem: Nutrition: Goal: Adequate nutrition will be maintained Outcome: Progressing   Problem: Coping: Goal: Level of anxiety will decrease Outcome: Progressing   Problem: Elimination: Goal: Will not experience complications related to bowel motility Outcome: Progressing Goal: Will not experience complications related to urinary retention Outcome: Progressing   Problem: Pain Managment: Goal: General experience of comfort will improve Outcome: Progressing   Problem: Skin Integrity: Goal: Risk for impaired skin integrity will decrease Outcome: Progressing   Problem: Nutritional: Goal: Maintenance of adequate nutrition will improve Outcome: Progressing   Problem: Ischemic Stroke/TIA Tissue Perfusion: Goal: Complications of ischemic stroke/TIA will be minimized Outcome: Progressing   Problem: Activity: Goal: Risk for activity intolerance will decrease Outcome: Not Progressing   Problem: Self-Care: Goal: Ability to participate in self-care as condition permits will improve Outcome: Not Progressing

## 2021-08-08 NOTE — Progress Notes (Addendum)
STROKE TEAM PROGRESS NOTE   SUBJECTIVE (INTERVAL HISTORY)  Daughter at bedside  Trach and PEG 7/28. Home meds resumed for BP control 7/29 with concern for ongoing hypertension overnight intermittently on Cleviprex gtt. Home metoprolol increased from 12.5 mg BID to 25 mg BID, hydralazine PRN ordered.   Concern for lisinopril reaction per daughter at bedside with facial swelling and previous hospitalization. Discussed with pharmacy and patient's daughter at bedside, lisinopril discontinued. With improved renal function, HCTZ reinitiated.   OBJECTIVE Temp:  [98.8 F (37.1 C)-100.2 F (37.9 C)] 98.8 F (37.1 C) (07/30 0400) Pulse Rate:  [65-90] 73 (07/30 0715) Cardiac Rhythm: Normal sinus rhythm (07/30 0400) Resp:  [15-29] 18 (07/30 0715) BP: (115-199)/(70-96) 156/83 (07/30 0715) SpO2:  [97 %-100 %] 99 % (07/30 0715) FiO2 (%):  [40 %] 40 % (07/30 0400)  Recent Labs  Lab 08/07/21 1124 08/07/21 1527 08/07/21 1934 08/07/21 2346 08/08/21 0307  GLUCAP 233* 240* 241* 224* 251*    Recent Labs  Lab 08/03/21 0347 08/05/21 0531 08/05/21 1619 08/06/21 0512 08/06/21 1812 08/07/21 0225 08/08/21 0227  NA 136 140  --  139  --  139 141  K 3.4* 3.3*  --  4.3  --  3.5 3.8  CL 101 107  --  106  --  104 106  CO2 24 25  --  22  --  26 25  GLUCOSE 212* 222*  --  216*  --  290* 258*  BUN 11 14  --  11  --  12 14  CREATININE 0.76 0.88  --  0.77  --  0.72 0.69  CALCIUM 9.0 7.9*  --  8.2*  --  8.7* 8.6*  MG 1.6*  --  3.0* 2.6* 2.1 2.0  --   PHOS  --   --  2.9 3.3 2.9 3.2  --     Recent Labs  Lab 08/02/21 2027  AST 26  ALT 28  ALKPHOS 29*  BILITOT 0.5  PROT 7.3  ALBUMIN 4.2    Recent Labs  Lab 08/02/21 2027 08/05/21 0531 08/06/21 0512 08/07/21 0225  WBC 6.4 12.5* 9.0 8.9  NEUTROABS 4.3  --   --   --   HGB 14.2 13.6 12.6* 13.2  HCT 43.0 40.3 38.5* 40.4  MCV 85.5 87.4 89.1 89.2  PLT 296 303 270 291    No results for input(s): "CKTOTAL", "CKMB", "CKMBINDEX", "TROPONINI" in  the last 168 hours. No results for input(s): "LABPROT", "INR" in the last 72 hours. Recent Labs    08/06/21 1811  COLORURINE RED*  LABSPEC 1.016  PHURINE 6.0  GLUCOSEU 50*  HGBUR LARGE*  BILIRUBINUR NEGATIVE  KETONESUR NEGATIVE  PROTEINUR 100*  NITRITE NEGATIVE  LEUKOCYTESUR NEGATIVE       Component Value Date/Time   CHOL 203 (H) 08/03/2021 0347   TRIG 175 (H) 08/03/2021 0347   HDL 52 08/03/2021 0347   CHOLHDL 3.9 08/03/2021 0347   VLDL 35 08/03/2021 0347   LDLCALC 116 (H) 08/03/2021 0347   Lab Results  Component Value Date   HGBA1C 8.3 (H) 08/03/2021   No results found for: "LABOPIA", "COCAINSCRNUR", "LABBENZ", "AMPHETMU", "THCU", "LABBARB"  No results for input(s): "ETH" in the last 168 hours.  I have personally reviewed the radiological images below and agree with the radiology interpretations.  DG Chest Port 1 View  Result Date: 08/06/2021 CLINICAL DATA:  Status post tracheostomy EXAM: PORTABLE CHEST 1 VIEW COMPARISON:  None Available. FINDINGS: Tracheostomy tube with tip in the proximal trachea  in appropriate position. New mild LEFT basilar atelectasis. Mild central venous congestion. IMPRESSION: Tracheostomy tube in good position. New LEFT basilar atelectasis. Electronically Signed   By: Suzy Bouchard M.D.   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 He presented with progressively worsening weakness in the right lower extremity, left lower extremity, right upper extremity, and left upper extremity. On the morning of the procedure, patient was flaccid at the right-sided extremities and had severe weakness in the left-sided extremities (grade 1/5). CTA of the head and neck and MRI of the brain  demonstrated severe stenoses at the right vertebrobasilar junction measuring about 90%. 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 There was occlusion of the left vertebral artery seen distal to the origin of the PICA. 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 EXAM: 1.  Biplane right carotid and cerebral DSA angiogram. 2.  Biplane left common carotid and cerebral DSA cerebral angiogram 3. Indirect left vertebral and cerebral angiogram with tip of the catheter at the origin of the left vertebral artery 4.  Right subclavian arteriogram. 5. Selective catheterization of the right vertebral artery, biplane vertebral and cerebral DSA cerebral angiogram. 6. Selective catheterization of the right vertebral artery by using the 088 neuron max guide catheter, 5 French AXS catalyst catheter as an intermediate catheter and 021 phenom microcatheter with a 014 Arostotle microwire. 7. High magnification biplane cerebral angiogram and exchange of the microcatheter over an exchange length 014 zoom wire into an Apex 2.25 x 15 mm monorail balloon catheter and angioplasty of the severely 90% stenotic right vertebrobasilar junction. 8. Resolute onyx 3 mm x 22 mm stent placement at the site of the stenosis. 9. Post stent biplane DSA cerebral angiography of the right vertebral artery injection. 10. Right common femoral arteriogram in the RAO projection after withdrawing the sheath with its tip in the external iliac artery and arteriogram Attending: Dr. Frazier Richards Assistant: Dr. Luanne Bras COMPARISON:  CORRELATION IS MADE WITH CTA OF THE HEAD AND NECK DATED August 03, 2021 MEDICATIONS: The antibiotic was administered within 1 hour of the procedure Injection Ancef 2 g Injection Integrilin 4.5 mg at 1.5 mg increments intra-arterially through the right vertebral artery Heparin 3500 units ANESTHESIA/SEDATION: Anesthesia was provided by the anesthesia team. Please  refer to the anesthesia notes for detailed information. CONTRAST:  126 mL of Omnipaque 300 FLUOROSCOPY: Fluoroscopy Time: 48 minutes 48 seconds (3499 mGy). COMPLICATIONS: None immediate. TECHNIQUE: Informed written consent was obtained from the patient and his daughter after a thorough discussion of the procedural risks including but not limited to approximately 5% bleeding, hematoma, vascular injury, worsening of the stroke, ventilator dependency etc. Benefits and alternatives. All questions were addressed. Maximal Sterile Barrier Technique was utilized including caps, mask, sterile gowns, sterile gloves, sterile drape, hand hygiene and skin antiseptic. A timeout was performed prior to the initiation of the procedure. PROCEDURE: The right groin was prepped and draped in the usual sterile fashion. Thereafter using modified Seldinger technique, transfemoral access into the right common femoral artery was obtained without difficulty. Over a 0.035 inch guidewire, an 8 French x 25 cm pinnacle sheath was inserted. Through this, and also over 0.035 inch roadrunner wire, a 5 [place] JB 1  catheter was advanced to the aortic arch region and selectively positioned in the right common carotid artery, and biplane DSA carotid and cerebral angiogram was obtained. Next, with the catheter tip in the left common carotid artery, biplane DSA carotid and cerebral Angiography of the head was obtained. The, selective catheterization of the left subclavian artery followed by angiogram was performed. With the tip of the catheter at the origin of the left vertebral artery, indirect left vertebral and biplane DSA cerebral angiography of left subclavian arterial injection was performed. Next, selective catheterization of the right subclavian artery was performed and the catheter was advanced into the proximal subclavian artery. Next, the wire was exchanged for an exchange length 035 Rosen wire. The JB 1 catheter was removed and 088 neuron max  guide catheter and 5 French AXS catalyst catheter were advanced and selective catheterization of the left vertebral artery followed by vertebral and cerebral angiogram was performed. Next, under high magnification angiography, and under roadmap guidance, phenom 21 microcatheter and 0 1 for restarting microwire were advanced into the left posterior cerebral artery and angiogram was performed. Next, the wire was exchanged for a 014 zoom exchange length wire. Subsequently, we selected 2.25 x 15 mm Apex monorail microcatheter and under roadmap guidance, angioplasty of the severely stenotic right vertebrobasilar artery was performed. Post angioplasty angiogram was performed. Next, the balloon was removed. Measurements were made. Next we selected 3 mm x 22 mm resolute onyx stent and the stent was deployed at the right vertebrobasilar stenotic site. The stent was gently dilated and was inflated up to 10 atmospheric pressure. Next, post stent biplane DSA cerebral angiogram was performed. At this point in time, we felt the goal of the procedure was obtained. The catheter was removed. Femoral access site was further sterilely cleaned and the femoral sheath was withdrawn with its tip in the right external iliac artery, femoral angiogram was performed in the RAO projection and the femoral access site was closed by using an 8 [place] Angio-Seal. Patient tolerated the procedure well and was transferred to the ICU intubated in stable condition. No immediate complications. FINDINGS: Right CCA: The right common carotid arteriogram demonstrates the right external carotid artery and its major branches to be widely patent. Right ICA: The right internal carotid artery at the bulb to the cranial skull base opacifies normally. No significant stenosis. Right Intracranial: The petrous and cavernous segments are widely patent. Mild 20-30% stenosis at the supraclinoid ICA. The right middle cerebral artery and the right anterior cerebral artery  opacify normally into the capillary and venous phases. Mild-to-moderate atheromatous disease with about 30% stenosis in the right M1 segment. There is severe about 70% stenosis of the right pericallosal branch of the ACA seen in the proximal aspect. There is cross filling via the anterior communicating artery seen with opacification of the left anterior cerebral artery. PCOM is patent with faint opacification of the right posterior cerebral artery. Right Sided Anterior Venous: The venous phase demonstrates preferential egress of contrast into the right transverse sinus, the sigmoid sinus and the right internal jugular vein. There is no early venous shunting into the venous structures at the skull base or intracranially into the dural sinuses. Left CCA: The left common carotid arteriogram demonstrates the left external carotid artery and its major branches to be widely patent. Left ICA: The left internal carotid artery at the bulb to the cranial skull base opacifies normally. Mild atheromatous disease at the origin of the ICA. There is mild stenosis seen about  2 cm from the origin of the left ICA measuring 42 percent by the NASCET criteria. Left Intracranial: The petrous, cavernous and supraclinoid segments are widely patent. The left middle cerebral artery and the left anterior cerebral artery opacify normally into the capillary and venous phases. There is a prominent PCOM opacifying the bilateral posterior cerebral arteries via the distal basilar artery. Left Sided Anterior Venous: The venous phase demonstrates preferential egress of contrast into the right transverse sinus, the sigmoid sinus and the right internal jugular vein. There is opacification of the left transverse sigmoid sinuses and internal jugular veins seen as well. There is no early venous shunting into the venous structures at the skull base or intracranially into the dural sinuses. Indirect left vertebral and cerebral angiogram with injection of  the left subclavian artery demonstrate faint opacification of the left vertebral artery. 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 High magnification biplane DSA cerebral angiography of right vertebral artery injection demonstrate severe about 90% stenosis at the right vertebrobasilar junction in length of about 7 mm. Post angioplasty angiogram demonstrate significant residual stenoses. Post stent angiogram demonstrate complete expansion of the stent with no residual stenosis. There is about 40-50% stenosis seen at the mid basilar artery. Right PICA, bilateral superior cerebellar, AICA and posterior cerebral arteries show moderate atheromatous disease. No significant stenosis. IMPRESSION: 1. Right vertebral and cerebral angiogram demonstrated about 90% stenosis at the vertebrobasilar junction. Successful angioplasty and stent placement without significant residual stenosis. 2.  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 3. 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 4. Mild atheromatous disease in the right intracranial ICA with 20-30% stenosis at the supraclinoid ICA and 30% stenosis in the right M1 segment. There is about 70% stenoses of the right pericallosal branch of the ACA seen in the A3 segment. 5.  Bilateral P comms are patent with the left PCOM being prominent. PLAN: 1.  Blood pressure goals between 120-140 mm Hg. 2.  Patient to be on dual antiplatelet agents for 6 months. Electronically Signed   By: Frazier Richards M.D.   On: 08/05/2021 14:38   MR BRAIN WO CONTRAST  Result Date: 08/05/2021 CLINICAL DATA:  66 year old male with history of known medullary infarct and severe vertebrobasilar disease, status post catheter directed revascularization and stenting. EXAM: MRI HEAD WITHOUT CONTRAST MRA HEAD WITHOUT CONTRAST TECHNIQUE: Multiplanar, multi-echo pulse sequences of the brain and surrounding structures were acquired without  intravenous contrast. Angiographic images of the Circle of Willis were acquired using MRA technique without intravenous contrast. COMPARISON:  Comparison made with arteriogram from earlier the same day as well as multiple previous studies. FINDINGS: MRI HEAD FINDINGS Brain: There has been interval expansion of previously identified ventral medullary infarct, now extending posteriorly to traversed the entirety of the medulla to the floor of the fourth ventricle (series 5, images 63, 64, 65), slightly worse on the left. Additionally, there are new scattered patchy ischemic infarcts involving the right cerebellum. Few additional patchy small volume ischemic infarcts noted involving the cortical aspects of the right greater than left occipital lobes (series 5, images 79, 77, 75, 73, 72 no significant associated mass effect. Chronic right cerebellar microhemorrhage noted. There is a new small focus of susceptibility artifact within the adjacent right cerebellum, likely a punctate focus of petechial blood products (series 14, image 20). No other associated blood products. Remainder the brain is otherwise stable in appearance. Stable cerebral volume with chronic  small vessel ischemic disease. No new focal parenchymal signal abnormality. Pituitary gland and suprasellar region remain within normal limits. Midline encephalomalacia with chronic hemosiderin staining at the anterior right frontal lobe again noted. Remote lacunar infarcts again noted about the cerebellum, pons, thalami, and left posterior corona radiata. No mass lesion, significant mass effect, or midline shift. Stable ventricular size without hydrocephalus. No extra-axial fluid collection. Vascular: Susceptibility artifact now seen about the vertebrobasilar junction related to interval stenting. Major intracranial vascular flow voids are maintained. Skull and upper cervical spine: Craniocervical junction within normal limits. Bone marrow signal intensity normal.  No new scalp soft tissue abnormality. Sinuses/Orbits: Globes and orbital soft tissues within normal limits. Moderate mucosal thickening throughout the paranasal sinuses with fluid within the nasopharynx. Patient is intubated. Mastoid air cells remain largely clear. Other: None. MRA HEAD FINDINGS Anterior circulation: Visualized distal cervical segments of the internal carotid arteries remain patent with antegrade flow. Petrous segments remain widely patent. Atheromatous irregularity about both carotid siphons with associated moderate stenosis at the supraclinoid left ICA. A1 segments patent. Normal anterior communicating artery complex. Left ACA remains patent. Distal severe right A2/A3 stenosis noted (series 1, image 164), stable. Left M1 segment remains patent. Mild to moderate long segment right M1 stenosis again noted. Negative MCA bifurcations. No proximal MCA branch occlusion. Distal MCA branches remain perfused and symmetric, although demonstrate mild small vessel atheromatous irregularity. Posterior circulation: Heterogeneous atheromatous irregularity again noted about both proximal V4 segments as they course into the cranial vault. Multifocal moderate stenoses involving the proximal left V4 segment again noted. Left V4 remains patent to the takeoff of the left PICA which remains patent and well perfused. Occlusion of the distal left V4 segment beyond the left PICA again noted, stable. On the right, there has been interval placement of a vascular stent extending from the distal right V4 segment across the vertebrobasilar junction into the proximal basilar artery. This traverses the previously seen high-grade stenosis at this level. Grossly patent flow through the stent, although evaluation for possible intra stent stenosis limited by MRA. Stable and fairly robust flow seen within the basilar artery distally. Suspected short-segment fenestration noted at the distal basilar artery. Superior cerebellar arteries  remain patent. Right PCA primarily supplied via the basilar. Left PCA supplied via the basilar as well as a robust left posterior communicating artery. Atheromatous disease involving both PCAs with associated severe bilateral P2 stenoses. PCAs remain patent to their distal aspects although demonstrate small vessel atheromatous irregularity. Anatomic variants: None significant. IMPRESSION: MRI HEAD IMPRESSION: 1. 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 Additionally, there are new scattered small volume ischemic infarcts involving the right cerebellum as well as the cortical aspects of the right greater than left occipital lobes. No associated mass effect. Single punctate focus of associated petechial hemorrhage at the right cerebellum as above. 2. Otherwise stable appearance of the brain with atrophy, chronic microvascular ischemic disease, and multiple chronic ischemic infarcts as above. MRA HEAD IMPRESSION: 1. Interval placement of a vascular stent extending from the distal right V4 segment across the vertebrobasilar junction into the proximal basilar artery. Grossly patent flow through the stent, although evaluation for possible intra stent stenosis is limited by MRA. Stable and fairly robust flow seen within the basilar artery distally. 2. Otherwise stable appearance of the intracranial circulation with moderate multifocal stenoses involving the proximal left V4 segment with distal left V4 occlusion, severe right  A2/A3 stenosis, severe bilateral P2 stenoses, moderate supraclinoid left ICA stenosis, with mild to moderate long segment right M1 stenosis. Electronically Signed   By: Jeannine Boga M.D.   On: 08/05/2021 02:44   MR ANGIO HEAD WO CONTRAST  Result Date: 08/05/2021 CLINICAL DATA:  66 year old male with history of known medullary infarct and severe vertebrobasilar disease, status  post catheter directed revascularization and stenting. EXAM: MRI HEAD WITHOUT CONTRAST MRA HEAD WITHOUT CONTRAST TECHNIQUE: Multiplanar, multi-echo pulse sequences of the brain and surrounding structures were acquired without intravenous contrast. Angiographic images of the Circle of Willis were acquired using MRA technique without intravenous contrast. COMPARISON:  Comparison made with arteriogram from earlier the same day as well as multiple previous studies. FINDINGS: MRI HEAD FINDINGS Brain: There has been interval expansion of previously identified ventral medullary infarct, now extending posteriorly to traversed the entirety of the medulla to the floor of the fourth ventricle (series 5, images 63, 64, 65), slightly worse on the left. Additionally, there are new scattered patchy ischemic infarcts involving the right cerebellum. Few additional patchy small volume ischemic infarcts noted involving the cortical aspects of the right greater than left occipital lobes (series 5, images 79, 77, 75, 73, 72 no significant associated mass effect. Chronic right cerebellar microhemorrhage noted. There is a new small focus of susceptibility artifact within the adjacent right cerebellum, likely a punctate focus of petechial blood products (series 14, image 20). No other associated blood products. Remainder the brain is otherwise stable in appearance. Stable cerebral volume with chronic small vessel ischemic disease. No new focal parenchymal signal abnormality. Pituitary gland and suprasellar region remain within normal limits. Midline encephalomalacia with chronic hemosiderin staining at the anterior right frontal lobe again noted. Remote lacunar infarcts again noted about the cerebellum, pons, thalami, and left posterior corona radiata. No mass lesion, significant mass effect, or midline shift. Stable ventricular size without hydrocephalus. No extra-axial fluid collection. Vascular: Susceptibility artifact now seen about  the vertebrobasilar junction related to interval stenting. Major intracranial vascular flow voids are maintained. Skull and upper cervical spine: Craniocervical junction within normal limits. Bone marrow signal intensity normal. No new scalp soft tissue abnormality. Sinuses/Orbits: Globes and orbital soft tissues within normal limits. Moderate mucosal thickening throughout the paranasal sinuses with fluid within the nasopharynx. Patient is intubated. Mastoid air cells remain largely clear. Other: None. MRA HEAD FINDINGS Anterior circulation: Visualized distal cervical segments of the internal carotid arteries remain patent with antegrade flow. Petrous segments remain widely patent. Atheromatous irregularity about both carotid siphons with associated moderate stenosis at the supraclinoid left ICA. A1 segments patent. Normal anterior communicating artery complex. Left ACA remains patent. Distal severe right A2/A3 stenosis noted (series 1, image 164), stable. Left M1 segment remains patent. Mild to moderate long segment right M1 stenosis again noted. Negative MCA bifurcations. No proximal MCA branch occlusion. Distal MCA branches remain perfused and symmetric, although demonstrate mild small vessel atheromatous irregularity. Posterior circulation: Heterogeneous atheromatous irregularity again noted about both proximal V4 segments as they course into the cranial vault. Multifocal moderate stenoses involving the proximal left V4 segment again noted. Left V4 remains patent to the takeoff of the left PICA which remains patent and well perfused. Occlusion of the distal left V4 segment beyond the left PICA again noted, stable. On the right, there has been interval placement of a vascular stent extending from the distal right V4 segment across the vertebrobasilar junction into the proximal basilar artery. This traverses the previously seen high-grade stenosis at this  level. Grossly patent flow through the stent, although  evaluation for possible intra stent stenosis limited by MRA. Stable and fairly robust flow seen within the basilar artery distally. Suspected short-segment fenestration noted at the distal basilar artery. Superior cerebellar arteries remain patent. Right PCA primarily supplied via the basilar. Left PCA supplied via the basilar as well as a robust left posterior communicating artery. Atheromatous disease involving both PCAs with associated severe bilateral P2 stenoses. PCAs remain patent to their distal aspects although demonstrate small vessel atheromatous irregularity. Anatomic variants: None significant. IMPRESSION: MRI HEAD IMPRESSION: 1. 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 Additionally, there are new scattered small volume ischemic infarcts involving the right cerebellum as well as the cortical aspects of the right greater than left occipital lobes. No associated mass effect. Single punctate focus of associated petechial hemorrhage at the right cerebellum as above. 2. Otherwise stable appearance of the brain with atrophy, chronic microvascular ischemic disease, and multiple chronic ischemic infarcts as above. MRA HEAD IMPRESSION: 1. Interval placement of a vascular stent extending from the distal right V4 segment across the vertebrobasilar junction into the proximal basilar artery. Grossly patent flow through the stent, although evaluation for possible intra stent stenosis is limited by MRA. Stable and fairly robust flow seen within the basilar artery distally. 2. Otherwise stable appearance of the intracranial circulation with moderate multifocal stenoses involving the proximal left V4 segment with distal left V4 occlusion, severe right A2/A3 stenosis, severe bilateral P2 stenoses, moderate supraclinoid left ICA stenosis, with mild to moderate long segment right M1 stenosis. Electronically  Signed   By: Jeannine Boga M.D.   On: 08/05/2021 02:44   DG Abd 1 View  Result Date: 08/04/2021 CLINICAL DATA:  NG tube placement EXAM: ABDOMEN - 1 VIEW COMPARISON:  10/11/2012 FINDINGS: Tip of NG tube is seen in the region of the antrum of the stomach. Bowel gas pattern is nonspecific. Pelvis is not included in its entirety. IMPRESSION: Tip of NG tube is seen in the stomach. Electronically Signed   By: Elmer Picker M.D.   On: 08/04/2021 17:32   DG Chest Port 1 View  Result Date: 08/04/2021 CLINICAL DATA:  Intubated EXAM: PORTABLE CHEST 1 VIEW COMPARISON:  08/04/2021 FINDINGS: Endotracheal tube tip is about 4.7 cm superior to the carina. Subsegmental atelectasis at the left base. Cardiomegaly. No consolidation, pleural effusion or pneumothorax IMPRESSION: 1. Endotracheal tube tip about 4.7 cm superior to the carina. 2. Cardiomegaly.  Subsegmental atelectasis at the left base. Electronically Signed   By: Donavan Foil M.D.   On: 08/04/2021 15:10   DG Chest Port 1 View  Result Date: 08/04/2021 CLINICAL DATA:  Endotracheal intubation EXAM: PORTABLE CHEST 1 VIEW COMPARISON:  Earlier same day FINDINGS: Endotracheal tube tip 6 cm above the carina. Cardiomegaly persists. Pulmonary venous hypertension, possibly with early interstitial edema. Right lateral chest not included on the image. No evidence of collapse or effusion. IMPRESSION: Endotracheal tube tip 6 cm above the carina. Electronically Signed   By: Nelson Chimes M.D.   On: 08/04/2021 13:42   ECHOCARDIOGRAM COMPLETE  Result Date: 08/04/2021    ECHOCARDIOGRAM REPORT   Patient Name:   CURRY SEEFELDT Date of Exam: 08/03/2021 Medical Rec #:  [id number]   Height:       67.0 in Accession #:    [id number]  Weight:       185.0 lb Date of Birth:  31-Dec-1955   BSA:          1.956 m Patient Age:    89 years    BP:           142/79 mmHg Patient Gender: M           HR:           76 bpm. Exam Location:  [facility] Procedure: 2D Echo, Cardiac Doppler and Color  Doppler Indications:     I63.9 Stroke  History:         Patient has no prior history of Echocardiogram examinations.                  Risk Factors:Diabetes, Dyslipidemia, Hypertension and Former                  Smoker.  Sonographer:     Rosalia Hammers Referring Phys:  [id number] Athena Masse Diagnosing Phys: [place]  1. Left ventricular ejection fraction, by estimation, is 60 to 65%. The left ventricle has normal function. The left ventricle has no regional wall motion abnormalities. There is moderate concentric left ventricular hypertrophy. Left ventricular diastolic parameters are consistent with Grade I diastolic dysfunction (impaired relaxation).  2. Right ventricular systolic function is normal. The right ventricular size is normal.  3. The mitral valve is normal in structure. Trivial mitral valve regurgitation. No evidence of mitral stenosis.  4. The aortic valve is normal in structure. Aortic valve regurgitation is mild. No aortic stenosis is present.  5. The inferior vena cava is normal in size with greater than 50% respiratory variability, suggesting right atrial pressure of 3 mmHg. FINDINGS  Left Ventricle: Left ventricular ejection fraction, by estimation, is 60 to 65%. The left ventricle has normal function. The left ventricle has no regional wall motion abnormalities. The left ventricular internal cavity size was normal in size. There is  moderate concentric left ventricular hypertrophy. Left ventricular diastolic parameters are consistent with Grade I diastolic dysfunction (impaired relaxation). Right Ventricle: The right ventricular size is normal. No increase in right ventricular wall thickness. Right ventricular systolic function is normal. Left Atrium: Left atrial size was normal in size. Right Atrium: Right atrial size was normal in size. Pericardium: There is no evidence of pericardial effusion. Mitral Valve: The mitral valve is normal in structure. Trivial mitral valve  regurgitation. No evidence of mitral valve stenosis. Tricuspid Valve: The tricuspid valve is normal in structure. Tricuspid valve regurgitation is trivial. No evidence of tricuspid stenosis. Aortic Valve: The aortic valve is normal in structure. Aortic valve regurgitation is mild. Aortic regurgitation PHT measures 1577 msec. No aortic stenosis is present. Aortic valve mean gradient measures 5.0 mmHg. Aortic valve peak gradient measures 9.1 mmHg. Aortic valve area, by VTI measures 2.30 cm. Pulmonic Valve: The pulmonic valve was normal in structure. Pulmonic valve regurgitation is not visualized. No evidence of pulmonic stenosis. Aorta: The aortic root is normal in size and structure. Venous: The inferior vena cava is normal in size with greater than 50% respiratory variability, suggesting right atrial pressure of 3 mmHg. IAS/Shunts: No atrial level shunt detected by color flow Doppler.  LEFT VENTRICLE PLAX 2D LVIDd:         3.84 cm   Diastology LVIDs:         2.17 cm   LV e' medial:    6.39 cm/s LV PW:         1.65 cm   LV E/e' medial:  9.6 LV IVS:  1.18 cm   LV e' lateral:   7.83 cm/s LVOT diam:     1.90 cm   LV E/e' lateral: 7.8 LV SV:         74 LV SV Index:   38 LVOT Area:     2.84 cm  RIGHT VENTRICLE RV Basal diam:  3.15 cm RV S prime:     17.40 cm/s TAPSE (M-mode): 2.4 cm LEFT ATRIUM             Index        RIGHT ATRIUM           Index LA diam:        3.20 cm 1.64 cm/m   RA Area:     18.10 cm LA Vol (A2C):   72.4 ml 37.01 ml/m  RA Volume:   47.10 ml  24.08 ml/m LA Vol (A4C):   65.8 ml 33.63 ml/m LA Biplane Vol: 69.3 ml 35.42 ml/m  AORTIC VALVE AV Area (Vmax):    2.10 cm AV Area (Vmean):   2.06 cm AV Area (VTI):     2.30 cm AV Vmax:           151.00 cm/s AV Vmean:          100.000 cm/s AV VTI:            0.323 m AV Peak Grad:      9.1 mmHg AV Mean Grad:      5.0 mmHg LVOT Vmax:         112.00 cm/s LVOT Vmean:        72.600 cm/s LVOT VTI:          0.262 m LVOT/AV VTI ratio: 0.81 AI PHT:             1577 msec  AORTA Ao Root diam: 3.20 cm MITRAL VALVE MV Area (PHT): 3.20 cm     SHUNTS MV Decel Time: 237 msec     Systemic VTI:  0.26 m MV E velocity: 61.30 cm/s   Systemic Diam: 1.90 cm MV A velocity: 101.00 cm/s MV E/A ratio:  0.61 Shaukat Khan Electronically signed by Neoma Laming Signature Date/Time: 08/04/2021/8:55:17 AM    Final    MR BRAIN WO CONTRAST  Result Date: 08/03/2021 CLINICAL DATA:  66 year old male with severe intracranial atherosclerosis and questionable medullary ischemia on brain MRI yesterday. EXAM: MRI HEAD WITHOUT CONTRAST TECHNIQUE: Multiplanar, multiecho pulse sequences of the brain and surrounding structures were obtained without intravenous contrast. COMPARISON:  CTA head and neck this morning.  Brain MRI yesterday. FINDINGS: Brain: More convincing appearance of ventral medullary restricted diffusion, asymmetrically greater to the left of midline. See series 5, image 12, series 7, images 18 and 19. Mild if any associated T2 and FLAIR hyperintensity there. And no other convincing diffusion restriction despite the CTA findings earlier today. 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 Chronic encephalomalacia right inferior frontal gyrus with hemosiderin. Occasional chronic microhemorrhages elsewhere, right cerebellum (series 13, image 19). No new signal abnormality compared to yesterday. No midline shift, mass effect, evidence of mass lesion, ventriculomegaly, extra-axial collection or acute intracranial hemorrhage. Cervicomedullary junction and pituitary are within normal limits. Vascular: Major intracranial vascular flow voids are stable from yesterday. See also CTA today reported separately. Skull and upper cervical spine: Negative. Visualized bone marrow signal is within normal limits. Sinuses/Orbits: Stable, negative. Other: Visible internal auditory structures appear normal. Mastoids are clear. Negative visible scalp and  face. IMPRESSION: 1. Positive for ischemia/infarction of the ventral Medulla as suspected yesterday, slightly greater on the left. No associated hemorrhage or mass effect. 2. 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 These results were communicated to Dr. Rory Percy at 11:06 am on 08/03/2021 by text page via the [facility] messaging system. Electronically Signed   By: Genevie Ann M.D.   On: 08/03/2021 11:06   CT ANGIO HEAD NECK W WO CM  Addendum Date: 08/03/2021   ADDENDUM REPORT: 08/03/2021 09:17 ADDENDUM: Study discussed by telephone with Dr. Amie Portland on 08/03/2021 at 0911 hours. Electronically Signed   By: Genevie Ann M.D.   On: 08/03/2021 09:17   Result Date: 08/03/2021 CLINICAL DATA:  66 year old male with neurologic deficit. Questionable small vessel ischemia of the medulla on brain MRI yesterday. EXAM: CT ANGIOGRAPHY HEAD AND NECK TECHNIQUE: Multidetector CT imaging of the head and neck was performed using the standard protocol during bolus administration of intravenous contrast. Multiplanar CT image reconstructions and MIPs were obtained to evaluate the vascular anatomy. Carotid stenosis measurements (when applicable) are obtained utilizing NASCET criteria, using the distal internal carotid diameter as the denominator. RADIATION DOSE REDUCTION: This exam was performed according to the departmental dose-optimization program which includes automated exposure control, adjustment of the mA and/or kV according to patient size and/or use of iterative reconstruction technique. CONTRAST:  72m OMNIPAQUE IOHEXOL 350 MG/ML SOLN COMPARISON:  Brain MRI yesterday.  Head CT yesterday. FINDINGS: CT HEAD Brain: Bulky Calcified atherosclerosis at the skull base. Stable CT appearance of the brainstem since yesterday. No acute intracranial hemorrhage identified. No midline shift, mass effect, or  evidence of intracranial mass lesion. Chronic right inferior frontal gyrus encephalomalacia, patchy bilateral cerebral white matter hypodensity more pronounced on the left. 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 No ventriculomegaly or acute cortically based infarct. Calvarium and skull base: No acute osseous abnormality identified. Paranasal sinuses: Visualized paranasal sinuses and mastoids are stable and well aerated. Orbits: Visualized orbits and scalp soft tissues are within normal limits. CTA NECK Skeleton: Periapical dental lucency bilaterally. Ordinary cervical spine degeneration. No acute osseous abnormality identified. Upper chest: Centrilobular and paraseptal emphysema. No superior mediastinal lymphadenopathy. Other neck: 15 mm hypodense right thyroid nodule. Recommend thyroid [place](ref: J Am Coll Radiol. 2015 Feb;12(2): 143-50).Dermal and subcutaneous probable 2.6 cm sebaceous cyst of the left submandibular face (series 13, image 79). Otherwise negative CT appearance of the neck soft tissues. Aortic arch: 3 vessel arch configuration. Mild arch atherosclerosis. Right carotid system: Negative brachiocephalic artery and proximal right CCA. Retropharyngeal course of the right carotid bifurcation with mild calcified plaque. Tortuous right ICA distal to the bulb, no stenosis. Left carotid system: Similar mild tortuosity and atherosclerosis with no hemodynamically significant stenosis. Vertebral arteries: Tortuous proximal right subclavian artery with a mildly kinked appearance in the superior mediastinum. Normal right vertebral artery origin. Patent right vertebral artery to the skull base with no significant plaque or stenosis. Mild to moderate soft and calcified plaque in the proximal left subclavian artery with less than 50 % stenosis with respect to the distal vessel. Mild atherosclerosis and stenosis at the left vertebral artery origin on series 13, image 159. Non dominant left  vertebral artery than is patent to the skull base with no additional plaque or stenosis. CTA HEAD Posterior circulation: Bilateral V4 segment calcified plaque. Severe stenosis on the left (series 12, image 139) upstream of  the left PICA origin which remains patent. But the left V4 segment is occluded distal to the left PICA. Contralateral right V4 moderate tandem stenoses upstream of the patent right PICA origin on series 12, image 130. And severe stenosis, near occlusion of the right vertebrobasilar junction (series 16, image 26). The basilar artery remains patent but is moderately stenotic in the mid basilar segment on series 16, image 24. Distal basilar remains patent along with SCA and PCA origins. Left posterior communicating artery is present, the right is diminutive or absent. a mild to moderate stenosis left PCA origin. And bilateral P2 segment severe stenosis, near occlusion on series 15, image 53. But there is preserved distal PCA branch enhancement bilaterally. Anterior circulation: Both ICA siphons are patent. However, on the left there is bulky soft plaque or thrombus in the supraclinoid ICA (series 14, image 162) this is between normal appearing left ophthalmic and posterior communicating artery origins. Left carotid terminus remains patent. Contralateral right ICA siphon with mild to moderate calcified plaque and mild supraclinoid stenosis. Patent carotid termini, MCA and ACA origins. Tortuous A1 segments. Normal anterior communicating artery. Proximal A2 segments are normal. There is severe right ACA distal A2 segment stenosis, near occlusion on series 17, image 32. Left MCA M1 segment is mildly irregular. Left MCA bifurcation is patent without stenosis. Left MCA branches are mildly irregular. Mild to moderate somewhat long segment right MCA M1 stenosis along 6 mm series 16, image 20. Distal M1 and right MCA trifurcation remain patent. Right MCA branches are mildly irregular. Venous sinuses: Early  contrast timing, grossly patent. Anatomic variants: Mildly dominant right vertebral artery. Review of the MIP images confirms the above findings IMPRESSION: 1. 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 - Moderate stenosis Mid Basilar Artery. - Near Occlusion bilateral PCA P2 segments, distal right ACA A2. - bulky soft plaque or thrombus in the Supraclinoid Left ICA, with up to Moderate stenosis. - mild-to-moderate Right MCA M1 stenosis. 2. Stable CT appearance of the brain since yesterday. No new intracranial abnormality. 3. Aortic Atherosclerosis (ICD10-I70.0) and Emphysema (ICD10-J43.9). Electronically Signed: By: Genevie Ann M.D. On: 08/03/2021 09:01   US Carotid Bilateral (at [facility] and [facility] only)  Result Date: 08/03/2021 CLINICAL DATA:  Follow-up examination for stroke. EXAM: BILATERAL CAROTID DUPLEX ULTRASOUND TECHNIQUE: Pearline Cables scale imaging, color Doppler and duplex ultrasound were performed of bilateral carotid and vertebral arteries in the neck. COMPARISON:  Prior Study from 04/03/2014. FINDINGS: Criteria: Quantification of carotid stenosis is based on velocity parameters that correlate the residual internal carotid diameter with NASCET-based stenosis levels, using the diameter of the distal internal carotid lumen as the denominator for stenosis measurement. The following velocity measurements were obtained: RIGHT ICA: 84/10 cm/sec CCA: 35/00 cm/sec SYSTOLIC ICA/CCA RATIO:  1.1 ECA: 64 cm/sec LEFT ICA: 64/16 cm/sec CCA: 938/18 cm/sec SYSTOLIC ICA/CCA RATIO:  0.9 ECA: 117 cm/sec RIGHT CAROTID ARTERY: Mild smooth intimal thickening seen within the visualized right CCA without significant stenosis. Mild heterogeneous mural based echogenic plaque seen about the right carotid bulb. No significant elevation in peak systolic velocity to suggest hemodynamically significant greater  than 50% stenosis. Visualized right ICA patent distally without stenosis. RIGHT VERTEBRAL ARTERY:  Patent with antegrade flow. LEFT CAROTID ARTERY: Diffuse smooth intimal thickening seen within the visualized left CCA without significant stenosis. Mild scattered heterogeneous echogenic plaque about the left carotid bulb/proximal left ICA. No significant elevation in  peak systolic velocity to suggest hemodynamically significant greater than 50% stenosis. Visualized left ICA patent distally without visible stenosis. LEFT VERTEBRAL ARTERY:  Patent with antegrade flow. IMPRESSION: 1. Mild atherosclerotic plaque within the carotid bulbs and proximal ICAs bilaterally, but no hemodynamically significant greater than 50% stenosis. 2. Patent antegrade flow within both vertebral arteries. Electronically Signed   By: Jeannine Boga M.D.   On: 08/03/2021 03:26   MR Cervical Spine Wo Contrast  Result Date: 08/03/2021 CLINICAL DATA:  Initial evaluation for ataxia, right-sided numbness. EXAM: MRI CERVICAL SPINE WITHOUT CONTRAST TECHNIQUE: Multiplanar, multisequence MR imaging of the cervical spine was performed. No intravenous contrast was administered. COMPARISON:  None available. FINDINGS: Alignment: Examination degraded by motion artifact. Straightening with mild reversal of the normal cervical lordosis. No listhesis. Vertebrae: Vertebral body height maintained without acute or chronic fracture. Bone marrow signal intensity within normal limits. No discrete or worrisome osseous lesions or abnormal marrow edema. Cord: Normal signal and morphology. No convincing cord signal changes on this motion degraded exam. Posterior Fossa, vertebral arteries, paraspinal tissues: Unremarkable. Disc levels: C2-C3: Unremarkable. C3-C4: Mild disc bulge with endplate and uncovertebral spurring. Flattening and partial effacement of the ventral thecal sac with resultant mild spinal stenosis. Moderate bilateral C4 foraminal narrowing.  C4-C5: Mild disc bulge with uncovertebral spurring. Mild flattening of the ventral thecal sac without significant spinal stenosis. Moderate right with mild left C5 foraminal stenosis. C5-C6: Minimal disc bulge with uncovertebral spurring. No spinal stenosis. Mild right C6 foraminal narrowing. Left neural foramen remains patent. C6-C7: Minimal annular disc bulge. No spinal stenosis. Foramina remain patent. C7-T1:  Unremarkable. Visualized upper thoracic spine demonstrates no significant finding. IMPRESSION: 1. Motion degraded exam. 2. No acute abnormality within the cervical spine. 3. Mild multilevel cervical spondylosis with resultant mild spinal stenosis at C3-4. Associated moderate bilateral C4 foraminal stenosis, with mild right C5 and C6 foraminal narrowing. Electronically Signed   By: Jeannine Boga M.D.   On: 08/03/2021 01:04   MR BRAIN WO CONTRAST  Result Date: 08/03/2021 CLINICAL DATA:  Initial evaluation for neuro deficit, stroke suspected. EXAM: MRI HEAD WITHOUT CONTRAST TECHNIQUE: Multiplanar, multiecho pulse sequences of the brain and surrounding structures were obtained without intravenous contrast. COMPARISON:  Prior CT from earlier the same day as well as previous MRI from 09/17/2015. FINDINGS: Brain: Generalized age-related cerebral atrophy. Patchy T2/FLAIR hyperintensity involving the periventricular and deep white matter both cerebral hemispheres as well as the pons, most consistent with chronic small vessel ischemic disease, moderately advanced in nature. Area of encephalomalacia of involving parasagittal anterior/inferior right frontal lobe noted at site of prior hemorrhage. Additional smaller foci of encephalomalacia involving the inferior temporal poles bilaterally likely posttraumatic in nature. Few scattered small remote left cerebellar infarcts noted. Subtle area of diffusion signal abnormality involving the ventral medulla measuring up to approximately 8 mm is seen (a series 5,  image 12). While this finding could potentially be artifactual, possibly due to prominent vascular calcifications about the adjacent vertebral arteries, a possible small acute ischemic infarct could be considered. No associated hemorrhage or mass effect. No other evidence for acute or subacute ischemia. No acute intracranial hemorrhage. Single chronic microhemorrhage noted within the right cerebellum, of doubtful significance in isolation. No mass lesion, midline shift or mass effect. No hydrocephalus or extra-axial fluid collection. Pituitary gland and suprasellar region within normal limits. Vascular: Major intracranial vascular flow voids are maintained. Skull and upper cervical spine: Craniocervical junction within normal limits. Bone marrow signal intensity normal. No scalp soft tissue  abnormality. Sinuses/Orbits: Globes and orbital soft tissues within normal limits. Paranasal sinuses are largely clear. No mastoid effusion. Other: None. IMPRESSION: 1. 8 mm focus of diffusion signal abnormality involving the ventral medulla, indeterminate. While this finding could potentially be artifactual in nature, a possible small acute ischemic infarct is difficult to exclude, and could be considered in the correct clinical setting. No associated hemorrhage or mass effect. 2. No other acute intracranial abnormality. 3. Underlying atrophy with moderate chronic small vessel ischemic disease with a few small remote left cerebellar infarcts. Area of encephalomalacia of involving the anterior/inferior right frontal lobe at site of previous hemorrhage. Electronically Signed   By: Jeannine Boga M.D.   On: 08/03/2021 00:59   CT Head Wo Contrast  Result Date: 08/02/2021 CLINICAL DATA:  New right upper extremity tremor. Tingling to the arm. EXAM: CT HEAD WITHOUT CONTRAST TECHNIQUE: Contiguous axial images were obtained from the base of the skull through the vertex without intravenous contrast. RADIATION DOSE REDUCTION:  This exam was performed according to the departmental dose-optimization program which includes automated exposure control, adjustment of the mA and/or kV according to patient size and/or use of iterative reconstruction technique. COMPARISON:  MRI brain 09/17/2015 FINDINGS: Brain: Mild cerebral atrophy. Mild ventricular dilatation consistent with central atrophy. Patchy low-attenuation changes in the deep white matter consistent small vessel ischemic change. Old area of encephalomalacia in the right anterior frontal lobe corresponding to previous intraparenchymal hemorrhage on prior MRI. No acute mass effect or midline shift. No abnormal extra-axial fluid collections. Gray-white matter junctions are distinct. Basal cisterns are not effaced. No acute intracranial hemorrhage. Vascular: No hyperdense vessel or unexpected calcification. Skull: Normal. Negative for fracture or focal lesion. Sinuses/Orbits: Paranasal sinuses and mastoid air cells are clear. Other: None. IMPRESSION: No acute intracranial abnormalities. Chronic atrophy and small vessel ischemic changes. 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 Electronically Signed   By: Lucienne Capers M.D.   On: 08/02/2021 21:20   DG Foot Complete Right  Result Date: 08/02/2021 CLINICAL DATA:  Chronic RIGHT arm and leg pain for since Sunday, RIGHT leg cold to touch, swelling RIGHT great toe EXAM: RIGHT FOOT COMPLETE - 3+ VIEW COMPARISON:  None Available. FINDINGS: Osseous mineralization normal. 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 Remaining joint spaces preserved. No acute fracture, dislocation, or bone destruction. Small plantar calcaneal spur. IMPRESSION: Degenerative changes first MTP joint and small plantar calcaneal spur. No acute osseous abnormalities. Electronically Signed   By: Lavonia Dana M.D.   On: 08/02/2021 20:51     PHYSICAL EXAM  Temp:  [98.8 F (37.1 C)-100.2 F (37.9 C)] 98.8 F (37.1 C) (07/30  0400) Pulse Rate:  [65-90] 73 (07/30 0715) Resp:  [15-29] 18 (07/30 0715) BP: (115-199)/(70-96) 156/83 (07/30 0715) SpO2:  [97 %-100 %] 99 % (07/30 0715) FiO2 (%):  [40 %] 40 % (07/30 0400)  General - Well nourished, well developed, tracheostomy in place on Precedex and mechanical ventilator in the ICU. Respiratory- Tracheostomy in place, midline. Moderate amount of clear secretions present with inline suctioning. Cough present with suctioning.  Cardiovascular - Regular rate and rhythm.  Neuro - On precedex for anxiety, eyes open, following midline commands, eyes in mid position but will track throughout visual fields, blinking to visual threat, PERRL. Corneal reflex present, gag and cough present. Breathing over the set ventilator rate.  Nystagmus present in leftward gaze worse in superior leftward gaze. Communicates with blinks. One blink indicates "no" answer and two blinks indicates a "  yes" answer. On pain stimulation, no movement in all extremities. No babinski. Patient endorses sensation to light touch intact and symmetric throughout extremities. Patient is unable to participate in FNF and HKS.  ASSESSMENT/PLAN Mr. Mcihael Hinderman is a 66 y.o. male with history of remote TBI with ICH, hypertension, hyperlipidemia, diabetes admitted for right leg/foot weakness and pain, right hand numbness, imbalance.  Gradually developed left leg weakness also during admission.  No tPA given due to outside window.  Quadriplegia on examination, attempt at extubation failed with immediate reintubation. Trach and PEG done 7/28. Ongoing hypertension overnight.  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  Ongoing aggressive stroke risk factor management Therapy recommendations: SNF vs. 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 Grossly patent flow through the stent, although evaluation for possible intra stent stenosis is limited by MRA  Respiratory failure Reintubated right after extubation Trach 7/28 Weaning on ventilator- PCCM is primary Concern for evolving pneumonia on 5-day course of ceftriaxone  Diabetes HgbA1c 8.3 goal < 7.0 Controlled CBG monitoring SSI DM education and close PCP follow up  Hypertension Unstable BP goal < 180/105 Intermittently on Cleviprex; labile blood pressures with inline suctioning and stimulation  On home Norvasc 10 mg Previously documented allergy to Lisinopril with facial swelling, Lisinopril discontinued Metoprolol increased to '25mg'$  BID, HCTZ reinitiated with improved renal function, hydralazine PRN added for further blood pressure control Long term BP goal normotensive  Hyperlipidemia Home meds: Lipitor 20 LDL 116, goal < 70 Now on Lipitor 80 Continue statin at discharge  Other Stroke Risk Factors Advanced age Former cigarette smoker quit smoking 33 years ago ETOH use, 1 drink per day  Other Active Problems Remote TBI with traumatic [facility] day # 5  -- Patient seen and examined by NP/APP with MD. MD to update note as  needed.   Anibal Henderson, AGACNP-BC [facility] [phone]  ATTENDING ATTESTATION:   66 year old status post basilar artery with vertebral basilar junction stenting.  On Brilinta and aspirin.  Status post trach and PEG yesterday.  Quadriplegic/aphasic on exam but can blink yes/ no answers.  Still on Cleviprex drip.  lisinopril stopped due to potential allergy in the past. He is not taking it at home despite pharmacy confirming it was being refilled monthly. increased metoprolol 25 twice daily. norvasc increased to '10mg'$  today in efforts to taper off cleviprex ggt. CCM weaning vent and attempt trach collar. CCM changed ceftriaxone to cefepime.   ADDENDUM: BP still elevated despite additional HCTZ 12.'5mg'$ . Clonidine 0.'1mg'$  TID added late afternoon. CCM aware. BP stable this evening. Con't to monitor.  Dr. Reeves Forth evaluated pt independently, reviewed imaging, chart, labs. Discussed and formulated plan with the APP. Please see APP note above for details.      This patient is critically ill due to brainstem stroke with now quadriplegic, respite failure and at significant risk of neurological worsening, death form 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 This patient's care requires constant monitoring of vital signs, hemodynamics, respiratory and cardiac monitoring, review of multiple databases, neurological assessment, discussion with family, other specialists and medical decision making of high complexity. I spent 35 minutes of neurocritical care time in the care of this patient.  Discussed plans with CCM and interventional radiology   Jarah Pember,MD   To contact Stroke Continuity provider, please refer to [email or web address] After hours, contact General Neurology

## 2021-08-09 ENCOUNTER — Inpatient Hospital Stay (HOSPITAL_COMMUNITY): Payer: Medicare PPO

## 2021-08-09 LAB — GLUCOSE, CAPILLARY
Glucose-Capillary: 196 mg/dL — ABNORMAL HIGH (ref 70–99)
Glucose-Capillary: 211 mg/dL — ABNORMAL HIGH (ref 70–99)
Glucose-Capillary: 226 mg/dL — ABNORMAL HIGH (ref 70–99)
Glucose-Capillary: 240 mg/dL — ABNORMAL HIGH (ref 70–99)
Glucose-Capillary: 190 mg/dL — ABNORMAL HIGH (ref 70–99)
Glucose-Capillary: 241 mg/dL — ABNORMAL HIGH (ref 70–99)

## 2021-08-09 LAB — BASIC METABOLIC PANEL
Anion gap: 9 (ref 5–15)
BUN: 15 mg/dL (ref 8–23)
CO2: 29 mmol/L (ref 22–32)
Calcium: 9.2 mg/dL (ref 8.9–10.3)
Chloride: 105 mmol/L (ref 98–111)
Creatinine, Ser: 0.7 mg/dL (ref 0.61–1.24)
GFR, Estimated: >60 mL/min (ref >60)
Glucose, Bld: 212 mg/dL — ABNORMAL HIGH (ref 70–99)
Potassium: 3.4 mmol/L — ABNORMAL LOW (ref 3.5–5.1)
Sodium: 143 mmol/L (ref 135–145)

## 2021-08-09 LAB — CBC
HCT: 41.3 % (ref 39.0–52.0)
Hemoglobin: 13.3 g/dL (ref 13.0–17.0)
MCH: 28.8 pg (ref 26.0–34.0)
MCHC: 32.2 g/dL (ref 30.0–36.0)
MCV: 89.4 fL (ref 80.0–100.0)
Platelets: 343 10*3/uL (ref 150–400)
RBC: 4.62 MIL/uL (ref 4.22–5.81)
RDW: 14.4 % (ref 11.5–15.5)
WBC: 9.6 10*3/uL (ref 4.0–10.5)
nRBC: 0 % (ref 0.0–0.2)

## 2021-08-09 MED ORDER — MIDAZOLAM HCL 2 MG/2ML IJ SOLN
INTRAMUSCULAR | Status: AC
  Administered 2021-08-09: 2 mg via INTRAVENOUS
  Filled 2021-08-09: qty 2

## 2021-08-09 MED ORDER — LIDOCAINE-EPINEPHRINE (PF) 2 %-1:200000 IJ SOLN: 1.0000 mL | Freq: Once | INTRAMUSCULAR | Status: DC

## 2021-08-09 MED ORDER — FENTANYL CITRATE PF 50 MCG/ML IJ SOSY: 2.0000 ug | PREFILLED_SYRINGE | Freq: Once | INTRAMUSCULAR | Status: DC

## 2021-08-09 MED ORDER — INSULIN ASPART 100 UNIT/ML IJ SOLN: 5.0000 [IU] | INTRAMUSCULAR | Status: DC

## 2021-08-09 MED ORDER — INSULIN ASPART 100 UNIT/ML IJ SOLN
4.0000 [IU] | INTRAMUSCULAR | Status: DC
  Administered 2021-08-09 – 2021-08-12 (×15): 4 [IU] via SUBCUTANEOUS

## 2021-08-09 MED ORDER — ETOMIDATE 2 MG/ML IV SOLN
20.0000 mg | Freq: Once | INTRAVENOUS | Status: AC
  Administered 2021-08-09: 20 mg via INTRAVENOUS

## 2021-08-09 MED ORDER — TRANEXAMIC ACID FOR INHALATION
500.0000 mg | Freq: Three times a day (TID) | RESPIRATORY_TRACT | Status: DC
  Administered 2021-08-09 – 2021-08-11 (×5): 500 mg via RESPIRATORY_TRACT
  Filled 2021-08-09 (×7): qty 10

## 2021-08-09 MED ORDER — SUCCINYLCHOLINE CHLORIDE 200 MG/10ML IV SOSY
PREFILLED_SYRINGE | INTRAVENOUS | Status: AC
  Filled 2021-08-09: qty 10

## 2021-08-09 MED ORDER — MIDAZOLAM HCL 2 MG/2ML IJ SOLN
INTRAMUSCULAR | Status: AC
  Filled 2021-08-09: qty 2

## 2021-08-09 MED ORDER — FENTANYL BOLUS VIA INFUSION
50.0000 ug | INTRAVENOUS | Status: DC | PRN
  Administered 2021-08-09 (×2): 50 ug via INTRAVENOUS

## 2021-08-09 MED ORDER — MIDAZOLAM HCL 2 MG/2ML IJ SOLN
2.0000 mg | INTRAMUSCULAR | Status: AC
  Administered 2021-08-09: 2 mg via INTRAVENOUS

## 2021-08-09 MED ORDER — FENTANYL CITRATE PF 50 MCG/ML IJ SOSY
PREFILLED_SYRINGE | INTRAMUSCULAR | Status: AC
  Administered 2021-08-09: 200 ug
  Filled 2021-08-09: qty 4

## 2021-08-09 MED ORDER — MIDAZOLAM HCL 2 MG/2ML IJ SOLN
2.0000 mg | Freq: Once | INTRAMUSCULAR | Status: AC
  Filled 2021-08-09: qty 2

## 2021-08-09 MED ORDER — POTASSIUM CHLORIDE 20 MEQ PO PACK
40.0000 meq | PACK | Freq: Once | ORAL | Status: AC
  Administered 2021-08-09: 40 meq
  Filled 2021-08-09: qty 2

## 2021-08-09 MED ORDER — KETAMINE HCL 50 MG/5ML IJ SOSY
PREFILLED_SYRINGE | INTRAMUSCULAR | Status: AC
  Filled 2021-08-09: qty 5

## 2021-08-09 MED ORDER — INSULIN DETEMIR 100 UNIT/ML ~~LOC~~ SOLN: 12.0000 [IU] | Freq: Two times a day (BID) | SUBCUTANEOUS | Status: DC

## 2021-08-09 MED ORDER — FENTANYL CITRATE PF 50 MCG/ML IJ SOSY
50.0000 ug | PREFILLED_SYRINGE | Freq: Once | INTRAMUSCULAR | Status: AC
  Administered 2021-08-09: 50 ug via INTRAVENOUS

## 2021-08-09 MED ORDER — DEXMEDETOMIDINE HCL IN NACL 400 MCG/100ML IV SOLN
0.0000 ug/kg/h | INTRAVENOUS | Status: AC
  Administered 2021-08-09: 0.4 ug/kg/h via INTRAVENOUS
  Administered 2021-08-10: 0.5 ug/kg/h via INTRAVENOUS
  Administered 2021-08-10 – 2021-08-12 (×6): 0.6 ug/kg/h via INTRAVENOUS
  Filled 2021-08-09 (×8): qty 100

## 2021-08-09 MED ORDER — INSULIN DETEMIR 100 UNIT/ML ~~LOC~~ SOLN
16.0000 [IU] | Freq: Two times a day (BID) | SUBCUTANEOUS | Status: DC
Start: 2021-08-09 — End: 2021-08-13
  Administered 2021-08-09 – 2021-08-13 (×7): 16 [IU] via SUBCUTANEOUS
  Filled 2021-08-09 (×9): qty 0.16

## 2021-08-09 MED ORDER — LIDOCAINE-EPINEPHRINE 2 %-1:50000 IJ SOLN
1.7000 mL | Freq: Once | INTRAMUSCULAR | Status: DC
  Filled 2021-08-09: qty 1.7

## 2021-08-09 MED ORDER — MIDAZOLAM HCL 2 MG/2ML IJ SOLN: 2.0000 mg | Freq: Once | INTRAMUSCULAR | Status: AC

## 2021-08-09 MED ORDER — FENTANYL CITRATE PF 50 MCG/ML IJ SOSY
PREFILLED_SYRINGE | INTRAMUSCULAR | Status: AC
  Filled 2021-08-09: qty 2

## 2021-08-09 MED ORDER — SODIUM CHLORIDE 0.9 % IV SOLN
1.0000 g | INTRAVENOUS | Status: DC
  Administered 2021-08-09 – 2021-08-10 (×2): 1 g via INTRAVENOUS
  Filled 2021-08-09 (×3): qty 10

## 2021-08-09 MED ORDER — DOCUSATE SODIUM 50 MG/5ML PO LIQD
100.0000 mg | Freq: Two times a day (BID) | ORAL | Status: DC
  Administered 2021-08-09 – 2021-08-13 (×5): 100 mg
  Filled 2021-08-09 (×6): qty 10

## 2021-08-09 MED ORDER — ETOMIDATE 2 MG/ML IV SOLN
INTRAVENOUS | Status: AC
  Filled 2021-08-09: qty 10

## 2021-08-09 MED ORDER — FENTANYL 2500MCG IN NS 250ML (10MCG/ML) PREMIX INFUSION
50.0000 ug/h | INTRAVENOUS | Status: DC
  Administered 2021-08-09: 50 ug/h via INTRAVENOUS
  Administered 2021-08-11: 75 ug/h via INTRAVENOUS
  Filled 2021-08-09 (×2): qty 250

## 2021-08-09 MED ORDER — ROCURONIUM BROMIDE 10 MG/ML (PF) SYRINGE
PREFILLED_SYRINGE | INTRAVENOUS | Status: AC
  Filled 2021-08-09: qty 10

## 2021-08-09 MED ORDER — LIDOCAINE-EPINEPHRINE 2 %-1:100000 IJ SOLN
1.7000 mL | Freq: Once | INTRAMUSCULAR | Status: DC
  Filled 2021-08-09: qty 1.7

## 2021-08-09 MED ORDER — FENTANYL CITRATE PF 50 MCG/ML IJ SOSY
100.0000 ug | PREFILLED_SYRINGE | Freq: Once | INTRAMUSCULAR | Status: AC
  Administered 2021-08-09: 100 ug via INTRAVENOUS

## 2021-08-09 MED ORDER — ETOMIDATE 2 MG/ML IV SOLN
INTRAVENOUS | Status: AC
  Filled 2021-08-09: qty 20

## 2021-08-09 NOTE — Procedures (Signed)
Tracheostomy Exchange Procedure Note  Joshua Donovan  [id number]  03-Nov-1955  Date:08/09/21  Time:5:05 PM   Provider Performing:Kalina Morabito   Procedure: Tracheostomy Exchange Through Immature Stoma [phone])  Indication(s) Bleeding from tracheostomy insertion site.   Consent Risks of the procedure as well as the alternatives and risks of each were explained to the patient and/or caregiver.  Consent for the procedure was obtained and is signed in the bedside chart  Anesthesia Fentanyl, versed.   Time Out Verified patient identification, verified procedure, site/side was marked, verified correct patient position, special equipment/implants available, medications/allergies/relevant history reviewed, required imaging and test results available.   Sterile Technique Hand hygiene, gloves   Procedure Description Size 6 cuffed existing Shiley removed and size 6 cuffed Distal XLT Shiley placed through stoma.   Complications/Tolerance Unable to pass tracheostomy tube through stoma over suction catheter x2 attempts. Intubated from above. No bleeding seen from stoma . Loose petrolatum impregnated gauze applied.   EBL Minimal  Kipp Brood, MD [facility] ICU Physician [place]  Pager: [phone] Or Epic Secure Chat After hours: [phone].  08/09/2021, 5:12 PM

## 2021-08-09 NOTE — Progress Notes (Signed)
STROKE TEAM PROGRESS NOTE   SUBJECTIVE (INTERVAL HISTORY)  His daughter is at bedside  Off Cleviprex drip.  Blood pressure better controlled.  Patient remains on ventilatory support but is being weaned.  Neurological exam unchanged.  Remains quadriplegic with follows commands well with eyes and mouth.  OBJECTIVE Temp:  [98.9 F (37.2 C)-100.2 F (37.9 C)] 100 F (37.8 C) (07/31 1130) Pulse Rate:  [81-114] 93 (07/31 0951) Cardiac Rhythm: Normal sinus rhythm (07/31 0400) Resp:  [12-34] 22 (07/31 0750) BP: (139-188)/(69-120) 181/95 (07/31 1340) SpO2:  [98 %-100 %] 100 % (07/31 0750) FiO2 (%):  [40 %] 40 % (07/31 1132)  Recent Labs  Lab 08/08/21 1930 08/08/21 2324 08/09/21 0315 08/09/21 0740 08/09/21 1117  GLUCAP 312* 252* 196* 211* 226*   Recent Labs  Lab 08/03/21 0347 08/05/21 0531 08/05/21 1619 08/06/21 0512 08/06/21 1812 08/07/21 0225 08/08/21 0227 08/09/21 0157  NA 136 140  --  139  --  139 141 143  K 3.4* 3.3*  --  4.3  --  3.5 3.8 3.4*  CL 101 107  --  106  --  104 106 105  CO2 24 25  --  22  --  '26 25 29  '$ GLUCOSE 212* 222*  --  216*  --  290* 258* 212*  BUN 11 14  --  11  --  '12 14 15  '$ CREATININE 0.76 0.88  --  0.77  --  0.72 0.69 0.70  CALCIUM 9.0 7.9*  --  8.2*  --  8.7* 8.6* 9.2  MG 1.6*  --  3.0* 2.6* 2.1 2.0  --   --   PHOS  --   --  2.9 3.3 2.9 3.2  --   --    Recent Labs  Lab 08/02/21 2027  AST 26  ALT 28  ALKPHOS 29*  BILITOT 0.5  PROT 7.3  ALBUMIN 4.2   Recent Labs  Lab 08/02/21 2027 08/05/21 0531 08/06/21 0512 08/07/21 0225 08/09/21 0157  WBC 6.4 12.5* 9.0 8.9 9.6  NEUTROABS 4.3  --   --   --   --   HGB 14.2 13.6 12.6* 13.2 13.3  HCT 43.0 40.3 38.5* 40.4 41.3  MCV 85.5 87.4 89.1 89.2 89.4  PLT 296 303 270 291 343   No results for input(s): "CKTOTAL", "CKMB", "CKMBINDEX", "TROPONINI" in the last 168 hours. No results for input(s): "LABPROT", "INR" in the last 72 hours. Recent Labs    08/06/21 1811  COLORURINE RED*  LABSPEC  1.016  PHURINE 6.0  GLUCOSEU 50*  HGBUR LARGE*  BILIRUBINUR NEGATIVE  KETONESUR NEGATIVE  PROTEINUR 100*  NITRITE NEGATIVE  LEUKOCYTESUR NEGATIVE      Component Value Date/Time   CHOL 203 (H) 08/03/2021 0347   TRIG 175 (H) 08/03/2021 0347   HDL 52 08/03/2021 0347   CHOLHDL 3.9 08/03/2021 0347   VLDL 35 08/03/2021 0347   LDLCALC 116 (H) 08/03/2021 0347   Lab Results  Component Value Date   HGBA1C 8.3 (H) 08/03/2021   No results found for: "LABOPIA", "COCAINSCRNUR", "LABBENZ", "AMPHETMU", "THCU", "LABBARB"  No results for input(s): "ETH" in the last 168 hours.  I have personally reviewed the radiological images below and agree with the radiology interpretations.  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 He presented with progressively worsening weakness in the right lower extremity, left lower extremity, right upper extremity, and left upper extremity. On the morning of the procedure, patient was flaccid at the right-sided extremities and had severe weakness in the left-sided extremities (grade 1/5). 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 There was occlusion of the left vertebral artery seen distal to the origin of the PICA. 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 EXAM: 1.  Biplane right carotid and cerebral DSA angiogram. 2.  Biplane left common carotid and cerebral DSA cerebral angiogram 3. Indirect left vertebral and cerebral angiogram with tip of the catheter at the origin of  the left vertebral artery 4.  Right subclavian arteriogram. 5. Selective catheterization of the right vertebral artery, biplane vertebral and cerebral DSA cerebral angiogram. 6. Selective catheterization of the right vertebral artery by using the 088 neuron max guide catheter, 5 French AXS catalyst catheter as an intermediate catheter and 021 phenom microcatheter with a 014 Arostotle microwire. 7. High magnification biplane cerebral angiogram and exchange of the microcatheter over an exchange length 014 zoom wire into an Apex 2.25 x 15 mm monorail balloon catheter and angioplasty of the severely 90% stenotic right vertebrobasilar junction. 8. Resolute onyx 3 mm x 22 mm stent placement at the site of the stenosis. 9. Post stent biplane DSA cerebral angiography of the right vertebral artery injection. 10. Right common femoral arteriogram in the RAO projection after withdrawing the sheath with its tip in the external iliac artery and arteriogram Attending: Dr. Frazier Richards Assistant: Dr. Luanne Bras COMPARISON:  CORRELATION IS MADE WITH CTA OF THE HEAD AND NECK DATED August 03, 2021 MEDICATIONS: The antibiotic was administered within 1 hour of the procedure Injection Ancef 2 g Injection Integrilin 4.5 mg at 1.5 mg increments intra-arterially through the right vertebral artery Heparin 3500 units ANESTHESIA/SEDATION: Anesthesia was provided by the anesthesia team. Please refer to the anesthesia notes for detailed information. CONTRAST:  126 mL of Omnipaque 300 FLUOROSCOPY: Fluoroscopy Time: 48 minutes 48 seconds (3499 mGy). COMPLICATIONS: None immediate. TECHNIQUE: Informed written consent was obtained from the patient and his daughter after a thorough discussion of the procedural risks including but not limited to approximately 5% bleeding, hematoma, vascular injury, worsening of the stroke, ventilator dependency etc. Benefits and alternatives. All questions were addressed. Maximal Sterile Barrier Technique was  utilized including caps, mask, sterile gowns, sterile gloves, sterile drape, hand hygiene and skin antiseptic. A timeout was performed prior to the initiation of the procedure. PROCEDURE: The right groin was prepped and draped in the usual sterile fashion. Thereafter using modified Seldinger technique, transfemoral access into the right common femoral artery was obtained without difficulty. Over a 0.035 inch guidewire, an 8 French x 25 cm pinnacle sheath was inserted. Through this, and also over 0.035 inch roadrunner wire, a 5 [place] JB 1 catheter was advanced to the aortic arch region and selectively positioned in the right common carotid artery, and biplane DSA carotid and cerebral angiogram was obtained. Next, with the catheter tip in the left common carotid artery, biplane DSA carotid and cerebral Angiography of the head was obtained. The, selective catheterization of the left subclavian artery followed by angiogram was performed. With the tip of the catheter at the origin of the left vertebral artery, indirect  left vertebral and biplane DSA cerebral angiography of left subclavian arterial injection was performed. Next, selective catheterization of the right subclavian artery was performed and the catheter was advanced into the proximal subclavian artery. Next, the wire was exchanged for an exchange length 035 Rosen wire. The JB 1 catheter was removed and 088 neuron max guide catheter and 5 French AXS catalyst catheter were advanced and selective catheterization of the left vertebral artery followed by vertebral and cerebral angiogram was performed. Next, under high magnification angiography, and under roadmap guidance, phenom 21 microcatheter and 0 1 for restarting microwire were advanced into the left posterior cerebral artery and angiogram was performed. Next, the wire was exchanged for a 014 zoom exchange length wire. Subsequently, we selected 2.25 x 15 mm Apex monorail microcatheter and under roadmap  guidance, angioplasty of the severely stenotic right vertebrobasilar artery was performed. Post angioplasty angiogram was performed. Next, the balloon was removed. Measurements were made. Next we selected 3 mm x 22 mm resolute onyx stent and the stent was deployed at the right vertebrobasilar stenotic site. The stent was gently dilated and was inflated up to 10 atmospheric pressure. Next, post stent biplane DSA cerebral angiogram was performed. At this point in time, we felt the goal of the procedure was obtained. The catheter was removed. Femoral access site was further sterilely cleaned and the femoral sheath was withdrawn with its tip in the right external iliac artery, femoral angiogram was performed in the RAO projection and the femoral access site was closed by using an 8 [place] Angio-Seal. Patient tolerated the procedure well and was transferred to the ICU intubated in stable condition. No immediate complications. FINDINGS: Right CCA: The right common carotid arteriogram demonstrates the right external carotid artery and its major branches to be widely patent. Right ICA: The right internal carotid artery at the bulb to the cranial skull base opacifies normally. No significant stenosis. Right Intracranial: The petrous and cavernous segments are widely patent. Mild 20-30% stenosis at the supraclinoid ICA. The right middle cerebral artery and the right anterior cerebral artery opacify normally into the capillary and venous phases. Mild-to-moderate atheromatous disease with about 30% stenosis in the right M1 segment. There is severe about 70% stenosis of the right pericallosal branch of the ACA seen in the proximal aspect. There is cross filling via the anterior communicating artery seen with opacification of the left anterior cerebral artery. PCOM is patent with faint opacification of the right posterior cerebral artery. Right Sided Anterior Venous: The venous phase demonstrates preferential egress of contrast  into the right transverse sinus, the sigmoid sinus and the right internal jugular vein. There is no early venous shunting into the venous structures at the skull base or intracranially into the dural sinuses. Left CCA: The left common carotid arteriogram demonstrates the left external carotid artery and its major branches to be widely patent. Left ICA: The left internal carotid artery at the bulb to the cranial skull base opacifies normally. Mild atheromatous disease at the origin of the ICA. There is mild stenosis seen about 2 cm from the origin of the left ICA measuring 42 percent by the NASCET criteria. Left Intracranial: The petrous, cavernous and supraclinoid segments are widely patent. The left middle cerebral artery and the left anterior cerebral artery opacify normally into the capillary and venous phases. There is a prominent PCOM opacifying the bilateral posterior cerebral arteries via the distal basilar artery. Left Sided Anterior Venous: The venous phase demonstrates preferential egress of contrast into the  right transverse sinus, the sigmoid sinus and the right internal jugular vein. There is opacification of the left transverse sigmoid sinuses and internal jugular veins seen as well. There is no early venous shunting into the venous structures at the skull base or intracranially into the dural sinuses. Indirect left vertebral and cerebral angiogram with injection of the left subclavian artery demonstrate faint opacification of the left vertebral artery. 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 High magnification biplane DSA cerebral angiography of right vertebral artery injection demonstrate severe about 90% stenosis at the right vertebrobasilar junction in length of about 7 mm. Post angioplasty angiogram demonstrate significant residual stenoses. Post stent angiogram demonstrate complete expansion of the stent with no residual stenosis. There is about 40-50%  stenosis seen at the mid basilar artery. Right PICA, bilateral superior cerebellar, AICA and posterior cerebral arteries show moderate atheromatous disease. No significant stenosis. IMPRESSION: 1. Right vertebral and cerebral angiogram demonstrated about 90% stenosis at the vertebrobasilar junction. Successful angioplasty and stent placement without significant residual stenosis. 2.  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 3. 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 4. Mild atheromatous disease in the right intracranial ICA with 20-30% stenosis at the supraclinoid ICA and 30% stenosis in the right M1 segment. There is about 70% stenoses of the right pericallosal branch of the ACA seen in the A3 segment. 5.  Bilateral P comms are patent with the left PCOM being prominent. PLAN: 1.  Blood pressure goals between 120-140 mm Hg. 2.  Patient to be on dual antiplatelet agents for 6 months. Electronically Signed   By: Frazier Richards M.D.   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 He presented with progressively worsening weakness in the right lower extremity, left lower extremity, right upper extremity, and left upper extremity. On the morning of the procedure, patient was flaccid at the right-sided extremities and had severe weakness in the left-sided extremities (grade 1/5). 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 There was occlusion of the left vertebral artery seen distal to the origin of the PICA. MRI of the brain demonstrated  ischemic infarction of the ventral medulla slightly greater on the left. EXAM: 1.  Biplane right carotid and cerebral DSA angiogram. 2.  Biplane left common carotid and cerebral DSA cerebral angiogram 3. Indirect left vertebral and cerebral angiogram with tip of the catheter at the origin of the left vertebral artery 4.  Right subclavian arteriogram. 5. Selective catheterization of the right vertebral artery, biplane vertebral and cerebral DSA cerebral angiogram. 6. Selective catheterization of the right vertebral artery by using the 088 neuron max guide catheter, 5 French AXS catalyst catheter as an intermediate catheter and 021 phenom microcatheter with a 014 Arostotle microwire. 7. High magnification biplane cerebral angiogram and exchange of the microcatheter over an exchange length 014 zoom wire into an Apex 2.25 x 15 mm monorail balloon catheter and angioplasty of the severely 90% stenotic right vertebrobasilar junction. 8. Resolute onyx 3 mm x 22 mm stent placement at the site of the  stenosis. 9. Post stent biplane DSA cerebral angiography of the right vertebral artery injection. 10. Right common femoral arteriogram in the RAO projection after withdrawing the sheath with its tip in the external iliac artery and arteriogram Attending: Dr. Frazier Richards Assistant: Dr. Luanne Bras COMPARISON:  CORRELATION IS MADE WITH CTA OF THE HEAD AND NECK DATED August 03, 2021 MEDICATIONS: The antibiotic was administered within 1 hour of the procedure Injection Ancef 2 g Injection Integrilin 4.5 mg at 1.5 mg increments intra-arterially through the right vertebral artery Heparin 3500 units ANESTHESIA/SEDATION: Anesthesia was provided by the anesthesia team. Please refer to the anesthesia notes for detailed information. CONTRAST:  126 mL of Omnipaque 300 FLUOROSCOPY: Fluoroscopy Time: 48 minutes 48 seconds (3499 mGy). COMPLICATIONS: None immediate. TECHNIQUE: Informed written consent was obtained from the patient and his  daughter after a thorough discussion of the procedural risks including but not limited to approximately 5% bleeding, hematoma, vascular injury, worsening of the stroke, ventilator dependency etc. Benefits and alternatives. All questions were addressed. Maximal Sterile Barrier Technique was utilized including caps, mask, sterile gowns, sterile gloves, sterile drape, hand hygiene and skin antiseptic. A timeout was performed prior to the initiation of the procedure. PROCEDURE: The right groin was prepped and draped in the usual sterile fashion. Thereafter using modified Seldinger technique, transfemoral access into the right common femoral artery was obtained without difficulty. Over a 0.035 inch guidewire, an 8 French x 25 cm pinnacle sheath was inserted. Through this, and also over 0.035 inch roadrunner wire, a 5 [place] JB 1 catheter was advanced to the aortic arch region and selectively positioned in the right common carotid artery, and biplane DSA carotid and cerebral angiogram was obtained. Next, with the catheter tip in the left common carotid artery, biplane DSA carotid and cerebral Angiography of the head was obtained. The, selective catheterization of the left subclavian artery followed by angiogram was performed. With the tip of the catheter at the origin of the left vertebral artery, indirect left vertebral and biplane DSA cerebral angiography of left subclavian arterial injection was performed. Next, selective catheterization of the right subclavian artery was performed and the catheter was advanced into the proximal subclavian artery. Next, the wire was exchanged for an exchange length 035 Rosen wire. The JB 1 catheter was removed and 088 neuron max guide catheter and 5 French AXS catalyst catheter were advanced and selective catheterization of the left vertebral artery followed by vertebral and cerebral angiogram was performed. Next, under high magnification angiography, and under roadmap guidance, phenom  21 microcatheter and 0 1 for restarting microwire were advanced into the left posterior cerebral artery and angiogram was performed. Next, the wire was exchanged for a 014 zoom exchange length wire. Subsequently, we selected 2.25 x 15 mm Apex monorail microcatheter and under roadmap guidance, angioplasty of the severely stenotic right vertebrobasilar artery was performed. Post angioplasty angiogram was performed. Next, the balloon was removed. Measurements were made. Next we selected 3 mm x 22 mm resolute onyx stent and the stent was deployed at the right vertebrobasilar stenotic site. The stent was gently dilated and was inflated up to 10 atmospheric pressure. Next, post stent biplane DSA cerebral angiogram was performed. At this point in time, we felt the goal of the procedure was obtained. The catheter was removed. Femoral access site was further sterilely cleaned and the femoral sheath was withdrawn with its tip in the right external iliac artery, femoral angiogram was performed in the RAO projection and the femoral access site  was closed by using an 8 [place] Angio-Seal. Patient tolerated the procedure well and was transferred to the ICU intubated in stable condition. No immediate complications. FINDINGS: Right CCA: The right common carotid arteriogram demonstrates the right external carotid artery and its major branches to be widely patent. Right ICA: The right internal carotid artery at the bulb to the cranial skull base opacifies normally. No significant stenosis. Right Intracranial: The petrous and cavernous segments are widely patent. Mild 20-30% stenosis at the supraclinoid ICA. The right middle cerebral artery and the right anterior cerebral artery opacify normally into the capillary and venous phases. Mild-to-moderate atheromatous disease with about 30% stenosis in the right M1 segment. There is severe about 70% stenosis of the right pericallosal branch of the ACA seen in the proximal aspect. There is  cross filling via the anterior communicating artery seen with opacification of the left anterior cerebral artery. PCOM is patent with faint opacification of the right posterior cerebral artery. Right Sided Anterior Venous: The venous phase demonstrates preferential egress of contrast into the right transverse sinus, the sigmoid sinus and the right internal jugular vein. There is no early venous shunting into the venous structures at the skull base or intracranially into the dural sinuses. Left CCA: The left common carotid arteriogram demonstrates the left external carotid artery and its major branches to be widely patent. Left ICA: The left internal carotid artery at the bulb to the cranial skull base opacifies normally. Mild atheromatous disease at the origin of the ICA. There is mild stenosis seen about 2 cm from the origin of the left ICA measuring 42 percent by the NASCET criteria. Left Intracranial: The petrous, cavernous and supraclinoid segments are widely patent. The left middle cerebral artery and the left anterior cerebral artery opacify normally into the capillary and venous phases. There is a prominent PCOM opacifying the bilateral posterior cerebral arteries via the distal basilar artery. Left Sided Anterior Venous: The venous phase demonstrates preferential egress of contrast into the right transverse sinus, the sigmoid sinus and the right internal jugular vein. There is opacification of the left transverse sigmoid sinuses and internal jugular veins seen as well. There is no early venous shunting into the venous structures at the skull base or intracranially into the dural sinuses. Indirect left vertebral and cerebral angiogram with injection of the left subclavian artery demonstrate faint opacification of the left vertebral artery. 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 High magnification biplane DSA cerebral angiography of right vertebral artery injection  demonstrate severe about 90% stenosis at the right vertebrobasilar junction in length of about 7 mm. Post angioplasty angiogram demonstrate significant residual stenoses. Post stent angiogram demonstrate complete expansion of the stent with no residual stenosis. There is about 40-50% stenosis seen at the mid basilar artery. Right PICA, bilateral superior cerebellar, AICA and posterior cerebral arteries show moderate atheromatous disease. No significant stenosis. IMPRESSION: 1. Right vertebral and cerebral angiogram demonstrated about 90% stenosis at the vertebrobasilar junction. Successful angioplasty and stent placement without significant residual stenosis. 2.  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 3. 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 4. Mild atheromatous disease in the right intracranial ICA with 20-30% stenosis at the supraclinoid ICA and 30% stenosis in the right M1 segment. There is about 70% stenoses of the right pericallosal branch of the ACA seen in the A3 segment. 5.  Bilateral P comms are patent with the left PCOM being  prominent. PLAN: 1.  Blood pressure goals between 120-140 mm Hg. 2.  Patient to be on dual antiplatelet agents for 6 months. Electronically Signed   By: Frazier Richards M.D.   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 He presented with progressively worsening weakness in the right lower extremity, left lower extremity, right upper extremity, and left upper extremity. On the morning of the procedure, patient was flaccid at the right-sided extremities and had severe weakness in the left-sided extremities (grade 1/5). CTA of the  head and neck and MRI of the brain demonstrated severe stenoses at the right vertebrobasilar junction measuring about 90%. 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 There was occlusion of the left vertebral artery seen distal to the origin of the PICA. 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 EXAM: 1.  Biplane right carotid and cerebral DSA angiogram. 2.  Biplane left common carotid and cerebral DSA cerebral angiogram 3. Indirect left vertebral and cerebral angiogram with tip of the catheter at the origin of the left vertebral artery 4.  Right subclavian arteriogram. 5. Selective catheterization of the right vertebral artery, biplane vertebral and cerebral DSA cerebral angiogram. 6. Selective catheterization of the right vertebral artery by using the 088 neuron max guide catheter, 5 French AXS catalyst catheter as an intermediate catheter and 021 phenom microcatheter with a 014 Arostotle microwire. 7. High magnification biplane cerebral angiogram and exchange of the microcatheter over an exchange length 014 zoom wire into an Apex 2.25 x 15 mm monorail balloon catheter and angioplasty of the severely 90% stenotic right vertebrobasilar junction. 8. Resolute onyx 3 mm x 22 mm stent placement at the site of the stenosis. 9. Post stent biplane DSA cerebral angiography of the right vertebral artery injection. 10. Right common femoral arteriogram in the RAO projection after withdrawing the sheath with its tip in the external iliac artery and arteriogram Attending: Dr. Frazier Richards Assistant: Dr. Luanne Bras COMPARISON:  CORRELATION IS MADE WITH CTA OF THE HEAD AND NECK DATED August 03, 2021 MEDICATIONS: The antibiotic was administered within 1 hour of the procedure Injection Ancef 2 g Injection Integrilin 4.5 mg at 1.5 mg increments intra-arterially through the right vertebral artery Heparin 3500 units ANESTHESIA/SEDATION: Anesthesia was  provided by the anesthesia team. Please refer to the anesthesia notes for detailed information. CONTRAST:  126 mL of Omnipaque 300 FLUOROSCOPY: Fluoroscopy Time: 48 minutes 48 seconds (3499 mGy). COMPLICATIONS: None immediate. TECHNIQUE: Informed written consent was obtained from the patient and his daughter after a thorough discussion of the procedural risks including but not limited to approximately 5% bleeding, hematoma, vascular injury, worsening of the stroke, ventilator dependency etc. Benefits and alternatives. All questions were addressed. Maximal Sterile Barrier Technique was utilized including caps, mask, sterile gowns, sterile gloves, sterile drape, hand hygiene and skin antiseptic. A timeout was performed prior to the initiation of the procedure. PROCEDURE: The right groin was prepped and draped in the usual sterile fashion. Thereafter using modified Seldinger technique, transfemoral access into the right common femoral artery was obtained without difficulty. Over a 0.035 inch guidewire, an 8 French x 25 cm pinnacle sheath was inserted. Through this, and also over 0.035 inch  roadrunner wire, a 5 [place] JB 1 catheter was advanced to the aortic arch region and selectively positioned in the right common carotid artery, and biplane DSA carotid and cerebral angiogram was obtained. Next, with the catheter tip in the left common carotid artery, biplane DSA carotid and cerebral Angiography of the head was obtained. The, selective catheterization of the left subclavian artery followed by angiogram was performed. With the tip of the catheter at the origin of the left vertebral artery, indirect left vertebral and biplane DSA cerebral angiography of left subclavian arterial injection was performed. Next, selective catheterization of the right subclavian artery was performed and the catheter was advanced into the proximal subclavian artery. Next, the wire was exchanged for an exchange length 035 Rosen wire. The JB 1  catheter was removed and 088 neuron max guide catheter and 5 French AXS catalyst catheter were advanced and selective catheterization of the left vertebral artery followed by vertebral and cerebral angiogram was performed. Next, under high magnification angiography, and under roadmap guidance, phenom 21 microcatheter and 0 1 for restarting microwire were advanced into the left posterior cerebral artery and angiogram was performed. Next, the wire was exchanged for a 014 zoom exchange length wire. Subsequently, we selected 2.25 x 15 mm Apex monorail microcatheter and under roadmap guidance, angioplasty of the severely stenotic right vertebrobasilar artery was performed. Post angioplasty angiogram was performed. Next, the balloon was removed. Measurements were made. Next we selected 3 mm x 22 mm resolute onyx stent and the stent was deployed at the right vertebrobasilar stenotic site. The stent was gently dilated and was inflated up to 10 atmospheric pressure. Next, post stent biplane DSA cerebral angiogram was performed. At this point in time, we felt the goal of the procedure was obtained. The catheter was removed. Femoral access site was further sterilely cleaned and the femoral sheath was withdrawn with its tip in the right external iliac artery, femoral angiogram was performed in the RAO projection and the femoral access site was closed by using an 8 [place] Angio-Seal. Patient tolerated the procedure well and was transferred to the ICU intubated in stable condition. No immediate complications. FINDINGS: Right CCA: The right common carotid arteriogram demonstrates the right external carotid artery and its major branches to be widely patent. Right ICA: The right internal carotid artery at the bulb to the cranial skull base opacifies normally. No significant stenosis. Right Intracranial: The petrous and cavernous segments are widely patent. Mild 20-30% stenosis at the supraclinoid ICA. The right middle cerebral  artery and the right anterior cerebral artery opacify normally into the capillary and venous phases. Mild-to-moderate atheromatous disease with about 30% stenosis in the right M1 segment. There is severe about 70% stenosis of the right pericallosal branch of the ACA seen in the proximal aspect. There is cross filling via the anterior communicating artery seen with opacification of the left anterior cerebral artery. PCOM is patent with faint opacification of the right posterior cerebral artery. Right Sided Anterior Venous: The venous phase demonstrates preferential egress of contrast into the right transverse sinus, the sigmoid sinus and the right internal jugular vein. There is no early venous shunting into the venous structures at the skull base or intracranially into the dural sinuses. Left CCA: The left common carotid arteriogram demonstrates the left external carotid artery and its major branches to be widely patent. Left ICA: The left internal carotid artery at the bulb to the cranial skull base opacifies normally. Mild atheromatous disease at the origin of the  ICA. There is mild stenosis seen about 2 cm from the origin of the left ICA measuring 42 percent by the NASCET criteria. Left Intracranial: The petrous, cavernous and supraclinoid segments are widely patent. The left middle cerebral artery and the left anterior cerebral artery opacify normally into the capillary and venous phases. There is a prominent PCOM opacifying the bilateral posterior cerebral arteries via the distal basilar artery. Left Sided Anterior Venous: The venous phase demonstrates preferential egress of contrast into the right transverse sinus, the sigmoid sinus and the right internal jugular vein. There is opacification of the left transverse sigmoid sinuses and internal jugular veins seen as well. There is no early venous shunting into the venous structures at the skull base or intracranially into the dural sinuses. Indirect left  vertebral and cerebral angiogram with injection of the left subclavian artery demonstrate faint opacification of the left vertebral artery. 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 High magnification biplane DSA cerebral angiography of right vertebral artery injection demonstrate severe about 90% stenosis at the right vertebrobasilar junction in length of about 7 mm. Post angioplasty angiogram demonstrate significant residual stenoses. Post stent angiogram demonstrate complete expansion of the stent with no residual stenosis. There is about 40-50% stenosis seen at the mid basilar artery. Right PICA, bilateral superior cerebellar, AICA and posterior cerebral arteries show moderate atheromatous disease. No significant stenosis. IMPRESSION: 1. Right vertebral and cerebral angiogram demonstrated about 90% stenosis at the vertebrobasilar junction. Successful angioplasty and stent placement without significant residual stenosis. 2.  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 3. 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 4. Mild atheromatous disease in the right intracranial ICA with 20-30% stenosis at the supraclinoid ICA and 30% stenosis in the right M1 segment. There is about 70% stenoses of the right pericallosal branch of the ACA seen in the A3 segment. 5.  Bilateral P comms are patent with the left PCOM being prominent. PLAN: 1.  Blood pressure goals between 120-140 mm Hg. 2.  Patient to be on dual antiplatelet agents for 6 months. Electronically Signed   By: Frazier Richards M.D.   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 He presented with progressively worsening weakness in  the right lower extremity, left lower extremity, right upper extremity, and left upper extremity. On the morning of the procedure, patient was flaccid at the right-sided extremities and had severe weakness in the left-sided extremities (grade 1/5). 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 There was occlusion of the left vertebral artery seen distal to the origin of the PICA. 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 EXAM: 1.  Biplane right carotid and cerebral DSA angiogram. 2.  Biplane left common carotid and cerebral DSA cerebral angiogram 3. Indirect left vertebral and cerebral angiogram with tip of the catheter at the origin of the left vertebral artery 4.  Right subclavian arteriogram. 5. Selective catheterization of the right vertebral artery, biplane vertebral and cerebral DSA cerebral angiogram. 6. Selective catheterization of the right vertebral artery by  using the 088 neuron max guide catheter, 5 French AXS catalyst catheter as an intermediate catheter and 021 phenom microcatheter with a 014 Arostotle microwire. 7. High magnification biplane cerebral angiogram and exchange of the microcatheter over an exchange length 014 zoom wire into an Apex 2.25 x 15 mm monorail balloon catheter and angioplasty of the severely 90% stenotic right vertebrobasilar junction. 8. Resolute onyx 3 mm x 22 mm stent placement at the site of the stenosis. 9. Post stent biplane DSA cerebral angiography of the right vertebral artery injection. 10. Right common femoral arteriogram in the RAO projection after withdrawing the sheath with its tip in the external iliac artery and arteriogram Attending: Dr. Frazier Richards Assistant: Dr. Luanne Bras COMPARISON:  CORRELATION IS MADE WITH CTA OF THE HEAD AND NECK DATED August 03, 2021 MEDICATIONS: The antibiotic was administered within 1 hour of the procedure Injection Ancef 2 g Injection Integrilin 4.5 mg at 1.5 mg increments intra-arterially through the right vertebral artery Heparin 3500 units ANESTHESIA/SEDATION: Anesthesia was provided by the anesthesia team. Please refer to the anesthesia notes for detailed information. CONTRAST:  126 mL of Omnipaque 300 FLUOROSCOPY: Fluoroscopy Time: 48 minutes 48 seconds (3499 mGy). COMPLICATIONS: None immediate. TECHNIQUE: Informed written consent was obtained from the patient and his daughter after a thorough discussion of the procedural risks including but not limited to approximately 5% bleeding, hematoma, vascular injury, worsening of the stroke, ventilator dependency etc. Benefits and alternatives. All questions were addressed. Maximal Sterile Barrier Technique was utilized including caps, mask, sterile gowns, sterile gloves, sterile drape, hand hygiene and skin antiseptic. A timeout was performed prior to the initiation of the procedure. PROCEDURE: The right groin was prepped and draped in the usual sterile fashion. Thereafter using modified Seldinger technique, transfemoral access into the right common femoral artery was obtained without difficulty. Over a 0.035 inch guidewire, an 8 French x 25 cm pinnacle sheath was inserted. Through this, and also over 0.035 inch roadrunner wire, a 5 [place] JB 1 catheter was advanced to the aortic arch region and selectively positioned in the right common carotid artery, and biplane DSA carotid and cerebral angiogram was obtained. Next, with the catheter tip in the left common carotid artery, biplane DSA carotid and cerebral Angiography of the head was obtained. The, selective catheterization of the left subclavian artery followed by angiogram was performed. With the tip of the catheter at the origin of the left vertebral artery, indirect left vertebral and biplane DSA cerebral angiography of left  subclavian arterial injection was performed. Next, selective catheterization of the right subclavian artery was performed and the catheter was advanced into the proximal subclavian artery. Next, the wire was exchanged for an exchange length 035 Rosen wire. The JB 1 catheter was removed and 088 neuron max guide catheter and 5 French AXS catalyst catheter were advanced and selective catheterization of the left vertebral artery followed by vertebral and cerebral angiogram was performed. Next, under high magnification angiography, and under roadmap guidance, phenom 21 microcatheter and 0 1 for restarting microwire were advanced into the left posterior cerebral artery and angiogram was performed. Next, the wire was exchanged for a 014 zoom exchange length wire. Subsequently, we selected 2.25 x 15 mm Apex monorail microcatheter and under roadmap guidance, angioplasty of the severely stenotic right vertebrobasilar artery was performed. Post angioplasty angiogram was performed. Next, the balloon was removed. Measurements were made. Next we selected 3 mm x 22 mm resolute onyx stent and the stent was deployed at the right  vertebrobasilar stenotic site. The stent was gently dilated and was inflated up to 10 atmospheric pressure. Next, post stent biplane DSA cerebral angiogram was performed. At this point in time, we felt the goal of the procedure was obtained. The catheter was removed. Femoral access site was further sterilely cleaned and the femoral sheath was withdrawn with its tip in the right external iliac artery, femoral angiogram was performed in the RAO projection and the femoral access site was closed by using an 8 [place] Angio-Seal. Patient tolerated the procedure well and was transferred to the ICU intubated in stable condition. No immediate complications. FINDINGS: Right CCA: The right common carotid arteriogram demonstrates the right external carotid artery and its major branches to be widely patent. Right ICA:  The right internal carotid artery at the bulb to the cranial skull base opacifies normally. No significant stenosis. Right Intracranial: The petrous and cavernous segments are widely patent. Mild 20-30% stenosis at the supraclinoid ICA. The right middle cerebral artery and the right anterior cerebral artery opacify normally into the capillary and venous phases. Mild-to-moderate atheromatous disease with about 30% stenosis in the right M1 segment. There is severe about 70% stenosis of the right pericallosal branch of the ACA seen in the proximal aspect. There is cross filling via the anterior communicating artery seen with opacification of the left anterior cerebral artery. PCOM is patent with faint opacification of the right posterior cerebral artery. Right Sided Anterior Venous: The venous phase demonstrates preferential egress of contrast into the right transverse sinus, the sigmoid sinus and the right internal jugular vein. There is no early venous shunting into the venous structures at the skull base or intracranially into the dural sinuses. Left CCA: The left common carotid arteriogram demonstrates the left external carotid artery and its major branches to be widely patent. Left ICA: The left internal carotid artery at the bulb to the cranial skull base opacifies normally. Mild atheromatous disease at the origin of the ICA. There is mild stenosis seen about 2 cm from the origin of the left ICA measuring 42 percent by the NASCET criteria. Left Intracranial: The petrous, cavernous and supraclinoid segments are widely patent. The left middle cerebral artery and the left anterior cerebral artery opacify normally into the capillary and venous phases. There is a prominent PCOM opacifying the bilateral posterior cerebral arteries via the distal basilar artery. Left Sided Anterior Venous: The venous phase demonstrates preferential egress of contrast into the right transverse sinus, the sigmoid sinus and the right  internal jugular vein. There is opacification of the left transverse sigmoid sinuses and internal jugular veins seen as well. There is no early venous shunting into the venous structures at the skull base or intracranially into the dural sinuses. Indirect left vertebral and cerebral angiogram with injection of the left subclavian artery demonstrate faint opacification of the left vertebral artery. 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 High magnification biplane DSA cerebral angiography of right vertebral artery injection demonstrate severe about 90% stenosis at the right vertebrobasilar junction in length of about 7 mm. Post angioplasty angiogram demonstrate significant residual stenoses. Post stent angiogram demonstrate complete expansion of the stent with no residual stenosis. There is about 40-50% stenosis seen at the mid basilar artery. Right PICA, bilateral superior cerebellar, AICA and posterior cerebral arteries show moderate atheromatous disease. No significant stenosis. IMPRESSION: 1. Right vertebral and cerebral angiogram demonstrated about 90% stenosis at the vertebrobasilar junction. Successful angioplasty and stent placement without significant residual stenosis.  2.  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 3. 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 4. Mild atheromatous disease in the right intracranial ICA with 20-30% stenosis at the supraclinoid ICA and 30% stenosis in the right M1 segment. There is about 70% stenoses of the right pericallosal branch of the ACA seen in the A3 segment. 5.  Bilateral P comms are patent with the left PCOM being prominent. PLAN: 1.  Blood pressure goals between 120-140 mm Hg. 2.  Patient to be on dual antiplatelet agents for 6 months. Electronically Signed   By: Frazier Richards M.D.   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 He presented with progressively worsening weakness in the right lower extremity, left lower extremity, right upper extremity, and left upper extremity. On the morning of the procedure, patient was flaccid at the right-sided extremities and had severe weakness in the left-sided extremities (grade 1/5). 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 There was occlusion of the left vertebral artery seen distal to the origin of the PICA. 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 EXAM: 1.  Biplane right carotid and cerebral DSA angiogram. 2.  Biplane left common carotid and cerebral DSA cerebral angiogram 3. Indirect left vertebral and cerebral angiogram with tip of the catheter at the origin of the left vertebral artery 4.  Right subclavian arteriogram. 5. Selective catheterization of the right vertebral artery, biplane vertebral and cerebral DSA cerebral angiogram. 6. Selective catheterization of the right vertebral artery by using the 088 neuron max guide catheter, 5 French AXS catalyst catheter as an intermediate catheter and 021 phenom microcatheter with a 014 Arostotle microwire. 7. High magnification biplane cerebral angiogram and exchange of the microcatheter over an exchange length 014 zoom wire into an Apex 2.25 x 15 mm monorail balloon catheter and angioplasty of the severely 90% stenotic right vertebrobasilar junction. 8. Resolute onyx 3 mm x 22 mm stent placement at the site of the stenosis. 9. Post stent biplane DSA cerebral angiography of the right vertebral artery  injection. 10. Right common femoral arteriogram in the RAO projection after withdrawing the sheath with its tip in the external iliac artery and arteriogram Attending: Dr. Frazier Richards Assistant: Dr. Luanne Bras COMPARISON:  CORRELATION IS MADE WITH CTA OF THE HEAD AND NECK DATED August 03, 2021 MEDICATIONS: The antibiotic was administered within 1 hour of the procedure Injection Ancef 2 g Injection Integrilin 4.5 mg at 1.5 mg increments intra-arterially through the right vertebral artery Heparin 3500 units ANESTHESIA/SEDATION: Anesthesia was provided by the anesthesia team. Please refer to the anesthesia notes for detailed information. CONTRAST:  126 mL of Omnipaque 300 FLUOROSCOPY: Fluoroscopy Time: 48 minutes 48 seconds (3499 mGy). COMPLICATIONS: None immediate. TECHNIQUE: Informed written consent was obtained from the patient and his daughter after a thorough discussion of the procedural risks including but not limited to approximately 5% bleeding, hematoma, vascular injury, worsening of the stroke, ventilator dependency etc. Benefits and alternatives. All questions were addressed. Maximal Sterile Barrier Technique was utilized including caps, mask,  sterile gowns, sterile gloves, sterile drape, hand hygiene and skin antiseptic. A timeout was performed prior to the initiation of the procedure. PROCEDURE: The right groin was prepped and draped in the usual sterile fashion. Thereafter using modified Seldinger technique, transfemoral access into the right common femoral artery was obtained without difficulty. Over a 0.035 inch guidewire, an 8 French x 25 cm pinnacle sheath was inserted. Through this, and also over 0.035 inch roadrunner wire, a 5 [place] JB 1 catheter was advanced to the aortic arch region and selectively positioned in the right common carotid artery, and biplane DSA carotid and cerebral angiogram was obtained. Next, with the catheter tip in the left common carotid artery, biplane DSA carotid and  cerebral Angiography of the head was obtained. The, selective catheterization of the left subclavian artery followed by angiogram was performed. With the tip of the catheter at the origin of the left vertebral artery, indirect left vertebral and biplane DSA cerebral angiography of left subclavian arterial injection was performed. Next, selective catheterization of the right subclavian artery was performed and the catheter was advanced into the proximal subclavian artery. Next, the wire was exchanged for an exchange length 035 Rosen wire. The JB 1 catheter was removed and 088 neuron max guide catheter and 5 [place] AXS catalyst catheter were advanced and selective catheterization of the left vertebral artery followed by vertebral and cerebral angiogram was performed. Next, under high magnification angiography, and under roadmap guidance, phenom 21 microcatheter and 0 1 for restarting microwire were advanced into the left posterior cerebral artery and angiogram was performed. Next, the wire was exchanged for a 014 zoom exchange length wire. Subsequently, we selected 2.25 x 15 mm Apex monorail microcatheter and under roadmap guidance, angioplasty of the severely stenotic right vertebrobasilar artery was performed. Post angioplasty angiogram was performed. Next, the balloon was removed. Measurements were made. Next we selected 3 mm x 22 mm resolute onyx stent and the stent was deployed at the right vertebrobasilar stenotic site. The stent was gently dilated and was inflated up to 10 atmospheric pressure. Next, post stent biplane DSA cerebral angiogram was performed. At this point in time, we felt the goal of the procedure was obtained. The catheter was removed. Femoral access site was further sterilely cleaned and the femoral sheath was withdrawn with its tip in the right external iliac artery, femoral angiogram was performed in the RAO projection and the femoral access site was closed by using an 8 [place] Angio-Seal.  Patient tolerated the procedure well and was transferred to the ICU intubated in stable condition. No immediate complications. FINDINGS: Right CCA: The right common carotid arteriogram demonstrates the right external carotid artery and its major branches to be widely patent. Right ICA: The right internal carotid artery at the bulb to the cranial skull base opacifies normally. No significant stenosis. Right Intracranial: The petrous and cavernous segments are widely patent. Mild 20-30% stenosis at the supraclinoid ICA. The right middle cerebral artery and the right anterior cerebral artery opacify normally into the capillary and venous phases. Mild-to-moderate atheromatous disease with about 30% stenosis in the right M1 segment. There is severe about 70% stenosis of the right pericallosal branch of the ACA seen in the proximal aspect. There is cross filling via the anterior communicating artery seen with opacification of the left anterior cerebral artery. PCOM is patent with faint opacification of the right posterior cerebral artery. Right Sided Anterior Venous: The venous phase demonstrates preferential egress of contrast into the right transverse sinus, the  sigmoid sinus and the right internal jugular vein. There is no early venous shunting into the venous structures at the skull base or intracranially into the dural sinuses. Left CCA: The left common carotid arteriogram demonstrates the left external carotid artery and its major branches to be widely patent. Left ICA: The left internal carotid artery at the bulb to the cranial skull base opacifies normally. Mild atheromatous disease at the origin of the ICA. There is mild stenosis seen about 2 cm from the origin of the left ICA measuring 42 percent by the NASCET criteria. Left Intracranial: The petrous, cavernous and supraclinoid segments are widely patent. The left middle cerebral artery and the left anterior cerebral artery opacify normally into the capillary  and venous phases. There is a prominent PCOM opacifying the bilateral posterior cerebral arteries via the distal basilar artery. Left Sided Anterior Venous: The venous phase demonstrates preferential egress of contrast into the right transverse sinus, the sigmoid sinus and the right internal jugular vein. There is opacification of the left transverse sigmoid sinuses and internal jugular veins seen as well. There is no early venous shunting into the venous structures at the skull base or intracranially into the dural sinuses. Indirect left vertebral and cerebral angiogram with injection of the left subclavian artery demonstrate faint opacification of the left vertebral artery. 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 High magnification biplane DSA cerebral angiography of right vertebral artery injection demonstrate severe about 90% stenosis at the right vertebrobasilar junction in length of about 7 mm. Post angioplasty angiogram demonstrate significant residual stenoses. Post stent angiogram demonstrate complete expansion of the stent with no residual stenosis. There is about 40-50% stenosis seen at the mid basilar artery. Right PICA, bilateral superior cerebellar, AICA and posterior cerebral arteries show moderate atheromatous disease. No significant stenosis. IMPRESSION: 1. Right vertebral and cerebral angiogram demonstrated about 90% stenosis at the vertebrobasilar junction. Successful angioplasty and stent placement without significant residual stenosis. 2.  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 3. 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 4. Mild atheromatous disease in the right intracranial ICA with 20-30% stenosis at the supraclinoid ICA and 30% stenosis in the right M1 segment. There is about 70% stenoses of the right pericallosal branch of the ACA seen in the A3 segment. 5.  Bilateral P comms are patent with the left PCOM being  prominent. PLAN: 1.  Blood pressure goals between 120-140 mm Hg. 2.  Patient to be on dual antiplatelet agents for 6 months. Electronically Signed   By: Frazier Richards M.D.   On: 08/09/2021 10:26   DG CHEST PORT 1 VIEW  Result Date: 08/09/2021 CLINICAL DATA:  Tracheostomy dependent. EXAM: PORTABLE CHEST 1 VIEW COMPARISON:  08/06/2021 FINDINGS: The tracheostomy tube tip is above the carina. Heart size and mediastinal contours are stable. Mild left lung base atelectasis with volume loss is stable from the previous exam. Right lung is clear. IMPRESSION: Stable exam. Electronically Signed   By: Kerby Moors M.D.   On: 08/09/2021 08:47   DG Chest Port 1 View  Result Date: 08/06/2021 CLINICAL DATA:  Status post tracheostomy EXAM: PORTABLE CHEST 1 VIEW COMPARISON:  None Available. FINDINGS: Tracheostomy tube with tip in the proximal trachea in appropriate position. New mild LEFT basilar atelectasis. Mild central venous congestion. IMPRESSION: Tracheostomy tube in good position. New LEFT basilar atelectasis. Electronically Signed   By: Suzy Bouchard M.D.   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 He presented with progressively worsening weakness in the right lower extremity, left lower extremity, right upper extremity, and left upper extremity. On the morning of the procedure, patient was flaccid at the right-sided extremities and had severe weakness in the left-sided extremities (grade 1/5). 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 There was occlusion of the left vertebral artery seen  distal to the origin of the PICA. 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 EXAM: 1.  Biplane right carotid and cerebral DSA angiogram. 2.  Biplane left common carotid and cerebral DSA cerebral angiogram 3. Indirect left vertebral and cerebral angiogram with tip of the catheter at the origin of the left vertebral artery 4.  Right subclavian arteriogram. 5. Selective catheterization of the right vertebral artery, biplane vertebral and cerebral DSA cerebral angiogram. 6. Selective catheterization of the right vertebral artery by using the 088 neuron max guide catheter, 5 French AXS catalyst catheter as an intermediate catheter and 021 phenom microcatheter with a 014 Arostotle microwire. 7. High magnification biplane cerebral angiogram and exchange of the microcatheter over an exchange length 014 zoom wire into an Apex 2.25 x 15 mm monorail balloon catheter and angioplasty of the severely 90% stenotic right vertebrobasilar junction. 8. Resolute onyx 3 mm x 22 mm stent placement at the site of the stenosis. 9. Post stent biplane DSA cerebral angiography of the right vertebral artery injection. 10. Right common femoral arteriogram in the RAO projection after withdrawing the sheath with its tip in the external iliac artery and arteriogram Attending: Dr. Frazier Richards Assistant: Dr. Luanne Bras COMPARISON:  CORRELATION IS MADE WITH CTA OF THE HEAD AND NECK DATED August 03, 2021 MEDICATIONS: The antibiotic was administered within 1 hour of the procedure Injection Ancef 2 g Injection Integrilin 4.5 mg at 1.5 mg increments intra-arterially through the right vertebral artery Heparin 3500 units ANESTHESIA/SEDATION: Anesthesia was provided by the anesthesia team. Please refer to the anesthesia notes for detailed information. CONTRAST:  126 mL of Omnipaque 300 FLUOROSCOPY: Fluoroscopy Time: 48 minutes 48 seconds (3499 mGy). COMPLICATIONS: None immediate. TECHNIQUE:  Informed written consent was obtained from the patient and his daughter after a thorough discussion of the procedural risks including but not limited to approximately 5% bleeding, hematoma, vascular injury, worsening of the stroke, ventilator dependency etc. Benefits and alternatives. All questions were addressed. Maximal Sterile Barrier Technique was utilized including caps, mask, sterile gowns, sterile gloves, sterile drape, hand hygiene and skin antiseptic. A timeout was performed prior to the initiation of the procedure. PROCEDURE: The right groin was prepped and draped in the usual sterile fashion. Thereafter using modified Seldinger technique, transfemoral access into the right common femoral artery was obtained without difficulty. Over a 0.035 inch guidewire, an 8 French x 25 cm pinnacle sheath was inserted. Through this, and also over 0.035 inch roadrunner wire, a 5 [place] JB 1 catheter was advanced to the aortic arch region and selectively positioned in the right common carotid artery, and biplane DSA carotid and cerebral angiogram was obtained. Next, with the catheter tip in the left common carotid artery, biplane  DSA carotid and cerebral Angiography of the head was obtained. The, selective catheterization of the left subclavian artery followed by angiogram was performed. With the tip of the catheter at the origin of the left vertebral artery, indirect left vertebral and biplane DSA cerebral angiography of left subclavian arterial injection was performed. Next, selective catheterization of the right subclavian artery was performed and the catheter was advanced into the proximal subclavian artery. Next, the wire was exchanged for an exchange length 035 Rosen wire. The JB 1 catheter was removed and 088 neuron max guide catheter and 5 French AXS catalyst catheter were advanced and selective catheterization of the left vertebral artery followed by vertebral and cerebral angiogram was performed. Next, under  high magnification angiography, and under roadmap guidance, phenom 21 microcatheter and 0 1 for restarting microwire were advanced into the left posterior cerebral artery and angiogram was performed. Next, the wire was exchanged for a 014 zoom exchange length wire. Subsequently, we selected 2.25 x 15 mm Apex monorail microcatheter and under roadmap guidance, angioplasty of the severely stenotic right vertebrobasilar artery was performed. Post angioplasty angiogram was performed. Next, the balloon was removed. Measurements were made. Next we selected 3 mm x 22 mm resolute onyx stent and the stent was deployed at the right vertebrobasilar stenotic site. The stent was gently dilated and was inflated up to 10 atmospheric pressure. Next, post stent biplane DSA cerebral angiogram was performed. At this point in time, we felt the goal of the procedure was obtained. The catheter was removed. Femoral access site was further sterilely cleaned and the femoral sheath was withdrawn with its tip in the right external iliac artery, femoral angiogram was performed in the RAO projection and the femoral access site was closed by using an 8 [place] Angio-Seal. Patient tolerated the procedure well and was transferred to the ICU intubated in stable condition. No immediate complications. FINDINGS: Right CCA: The right common carotid arteriogram demonstrates the right external carotid artery and its major branches to be widely patent. Right ICA: The right internal carotid artery at the bulb to the cranial skull base opacifies normally. No significant stenosis. Right Intracranial: The petrous and cavernous segments are widely patent. Mild 20-30% stenosis at the supraclinoid ICA. The right middle cerebral artery and the right anterior cerebral artery opacify normally into the capillary and venous phases. Mild-to-moderate atheromatous disease with about 30% stenosis in the right M1 segment. There is severe about 70% stenosis of the right  pericallosal branch of the ACA seen in the proximal aspect. There is cross filling via the anterior communicating artery seen with opacification of the left anterior cerebral artery. PCOM is patent with faint opacification of the right posterior cerebral artery. Right Sided Anterior Venous: The venous phase demonstrates preferential egress of contrast into the right transverse sinus, the sigmoid sinus and the right internal jugular vein. There is no early venous shunting into the venous structures at the skull base or intracranially into the dural sinuses. Left CCA: The left common carotid arteriogram demonstrates the left external carotid artery and its major branches to be widely patent. Left ICA: The left internal carotid artery at the bulb to the cranial skull base opacifies normally. Mild atheromatous disease at the origin of the ICA. There is mild stenosis seen about 2 cm from the origin of the left ICA measuring 42 percent by the NASCET criteria. Left Intracranial: The petrous, cavernous and supraclinoid segments are widely patent. The left middle cerebral artery and the left anterior cerebral artery  opacify normally into the capillary and venous phases. There is a prominent PCOM opacifying the bilateral posterior cerebral arteries via the distal basilar artery. Left Sided Anterior Venous: The venous phase demonstrates preferential egress of contrast into the right transverse sinus, the sigmoid sinus and the right internal jugular vein. There is opacification of the left transverse sigmoid sinuses and internal jugular veins seen as well. There is no early venous shunting into the venous structures at the skull base or intracranially into the dural sinuses. Indirect left vertebral and cerebral angiogram with injection of the left subclavian artery demonstrate faint opacification of the left vertebral artery. 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 High magnification biplane  DSA cerebral angiography of right vertebral artery injection demonstrate severe about 90% stenosis at the right vertebrobasilar junction in length of about 7 mm. Post angioplasty angiogram demonstrate significant residual stenoses. Post stent angiogram demonstrate complete expansion of the stent with no residual stenosis. There is about 40-50% stenosis seen at the mid basilar artery. Right PICA, bilateral superior cerebellar, AICA and posterior cerebral arteries show moderate atheromatous disease. No significant stenosis. IMPRESSION: 1. Right vertebral and cerebral angiogram demonstrated about 90% stenosis at the vertebrobasilar junction. Successful angioplasty and stent placement without significant residual stenosis. 2.  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 3. 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 4. Mild atheromatous disease in the right intracranial ICA with 20-30% stenosis at the supraclinoid ICA and 30% stenosis in the right M1 segment. There is about 70% stenoses of the right pericallosal branch of the ACA seen in the A3 segment. 5.  Bilateral P comms are patent with the left PCOM being prominent. PLAN: 1.  Blood pressure goals between 120-140 mm Hg. 2.  Patient to be on dual antiplatelet agents for 6 months. Electronically Signed   By: Frazier Richards M.D.   On: 08/05/2021 14:38   MR BRAIN WO CONTRAST  Result Date: 08/05/2021 CLINICAL DATA:  66 year old male with history of known medullary infarct and severe vertebrobasilar disease, status post catheter directed revascularization and stenting. EXAM: MRI HEAD WITHOUT CONTRAST MRA HEAD WITHOUT CONTRAST TECHNIQUE: Multiplanar, multi-echo pulse sequences of the brain and surrounding structures were acquired without intravenous contrast. Angiographic images of the Circle of Willis were acquired using MRA technique without intravenous contrast. COMPARISON:  Comparison made with arteriogram from earlier the same day  as well as multiple previous studies. FINDINGS: MRI HEAD FINDINGS Brain: There has been interval expansion of previously identified ventral medullary infarct, now extending posteriorly to traversed the entirety of the medulla to the floor of the fourth ventricle (series 5, images 63, 64, 65), slightly worse on the left. Additionally, there are new scattered patchy ischemic infarcts involving the right cerebellum. Few additional patchy small volume ischemic infarcts noted involving the cortical aspects of the right greater than left occipital lobes (series 5, images 79, 77, 75, 73, 72 no significant associated mass effect. Chronic right cerebellar microhemorrhage noted. There is a new small focus of susceptibility artifact within the adjacent right cerebellum, likely a punctate focus of petechial blood products (series 14, image 20). No other associated blood products. Remainder the brain is otherwise stable in appearance. Stable cerebral volume with chronic small vessel ischemic disease. No new focal parenchymal signal abnormality. Pituitary gland and suprasellar region remain within normal limits. Midline encephalomalacia with chronic hemosiderin staining at the anterior right frontal lobe again noted. Remote lacunar infarcts again noted  about the cerebellum, pons, thalami, and left posterior corona radiata. No mass lesion, significant mass effect, or midline shift. Stable ventricular size without hydrocephalus. No extra-axial fluid collection. Vascular: Susceptibility artifact now seen about the vertebrobasilar junction related to interval stenting. Major intracranial vascular flow voids are maintained. Skull and upper cervical spine: Craniocervical junction within normal limits. Bone marrow signal intensity normal. No new scalp soft tissue abnormality. Sinuses/Orbits: Globes and orbital soft tissues within normal limits. Moderate mucosal thickening throughout the paranasal sinuses with fluid within the  nasopharynx. Patient is intubated. Mastoid air cells remain largely clear. Other: None. MRA HEAD FINDINGS Anterior circulation: Visualized distal cervical segments of the internal carotid arteries remain patent with antegrade flow. Petrous segments remain widely patent. Atheromatous irregularity about both carotid siphons with associated moderate stenosis at the supraclinoid left ICA. A1 segments patent. Normal anterior communicating artery complex. Left ACA remains patent. Distal severe right A2/A3 stenosis noted (series 1, image 164), stable. Left M1 segment remains patent. Mild to moderate long segment right M1 stenosis again noted. Negative MCA bifurcations. No proximal MCA branch occlusion. Distal MCA branches remain perfused and symmetric, although demonstrate mild small vessel atheromatous irregularity. Posterior circulation: Heterogeneous atheromatous irregularity again noted about both proximal V4 segments as they course into the cranial vault. Multifocal moderate stenoses involving the proximal left V4 segment again noted. Left V4 remains patent to the takeoff of the left PICA which remains patent and well perfused. Occlusion of the distal left V4 segment beyond the left PICA again noted, stable. On the right, there has been interval placement of a vascular stent extending from the distal right V4 segment across the vertebrobasilar junction into the proximal basilar artery. This traverses the previously seen high-grade stenosis at this level. Grossly patent flow through the stent, although evaluation for possible intra stent stenosis limited by MRA. Stable and fairly robust flow seen within the basilar artery distally. Suspected short-segment fenestration noted at the distal basilar artery. Superior cerebellar arteries remain patent. Right PCA primarily supplied via the basilar. Left PCA supplied via the basilar as well as a robust left posterior communicating artery. Atheromatous disease involving both  PCAs with associated severe bilateral P2 stenoses. PCAs remain patent to their distal aspects although demonstrate small vessel atheromatous irregularity. Anatomic variants: None significant. IMPRESSION: MRI HEAD IMPRESSION: 1. 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 Additionally, there are new scattered small volume ischemic infarcts involving the right cerebellum as well as the cortical aspects of the right greater than left occipital lobes. No associated mass effect. Single punctate focus of associated petechial hemorrhage at the right cerebellum as above. 2. Otherwise stable appearance of the brain with atrophy, chronic microvascular ischemic disease, and multiple chronic ischemic infarcts as above. MRA HEAD IMPRESSION: 1. Interval placement of a vascular stent extending from the distal right V4 segment across the vertebrobasilar junction into the proximal basilar artery. Grossly patent flow through the stent, although evaluation for possible intra stent stenosis is limited by MRA. Stable and fairly robust flow seen within the basilar artery distally. 2. Otherwise stable appearance of the intracranial circulation with moderate multifocal stenoses involving the proximal left V4 segment with distal left V4 occlusion, severe right A2/A3 stenosis, severe bilateral P2 stenoses, moderate supraclinoid left ICA stenosis, with mild to moderate long segment right M1 stenosis. Electronically Signed   By: Jeannine Boga M.D.   On: 08/05/2021 02:44   MR ANGIO  HEAD WO CONTRAST  Result Date: 08/05/2021 CLINICAL DATA:  66 year old male with history of known medullary infarct and severe vertebrobasilar disease, status post catheter directed revascularization and stenting. EXAM: MRI HEAD WITHOUT CONTRAST MRA HEAD WITHOUT CONTRAST TECHNIQUE: Multiplanar, multi-echo pulse sequences of the brain and  surrounding structures were acquired without intravenous contrast. Angiographic images of the Circle of Willis were acquired using MRA technique without intravenous contrast. COMPARISON:  Comparison made with arteriogram from earlier the same day as well as multiple previous studies. FINDINGS: MRI HEAD FINDINGS Brain: There has been interval expansion of previously identified ventral medullary infarct, now extending posteriorly to traversed the entirety of the medulla to the floor of the fourth ventricle (series 5, images 63, 64, 65), slightly worse on the left. Additionally, there are new scattered patchy ischemic infarcts involving the right cerebellum. Few additional patchy small volume ischemic infarcts noted involving the cortical aspects of the right greater than left occipital lobes (series 5, images 79, 77, 75, 73, 72 no significant associated mass effect. Chronic right cerebellar microhemorrhage noted. There is a new small focus of susceptibility artifact within the adjacent right cerebellum, likely a punctate focus of petechial blood products (series 14, image 20). No other associated blood products. Remainder the brain is otherwise stable in appearance. Stable cerebral volume with chronic small vessel ischemic disease. No new focal parenchymal signal abnormality. Pituitary gland and suprasellar region remain within normal limits. Midline encephalomalacia with chronic hemosiderin staining at the anterior right frontal lobe again noted. Remote lacunar infarcts again noted about the cerebellum, pons, thalami, and left posterior corona radiata. No mass lesion, significant mass effect, or midline shift. Stable ventricular size without hydrocephalus. No extra-axial fluid collection. Vascular: Susceptibility artifact now seen about the vertebrobasilar junction related to interval stenting. Major intracranial vascular flow voids are maintained. Skull and upper cervical spine: Craniocervical junction within normal  limits. Bone marrow signal intensity normal. No new scalp soft tissue abnormality. Sinuses/Orbits: Globes and orbital soft tissues within normal limits. Moderate mucosal thickening throughout the paranasal sinuses with fluid within the nasopharynx. Patient is intubated. Mastoid air cells remain largely clear. Other: None. MRA HEAD FINDINGS Anterior circulation: Visualized distal cervical segments of the internal carotid arteries remain patent with antegrade flow. Petrous segments remain widely patent. Atheromatous irregularity about both carotid siphons with associated moderate stenosis at the supraclinoid left ICA. A1 segments patent. Normal anterior communicating artery complex. Left ACA remains patent. Distal severe right A2/A3 stenosis noted (series 1, image 164), stable. Left M1 segment remains patent. Mild to moderate long segment right M1 stenosis again noted. Negative MCA bifurcations. No proximal MCA branch occlusion. Distal MCA branches remain perfused and symmetric, although demonstrate mild small vessel atheromatous irregularity. Posterior circulation: Heterogeneous atheromatous irregularity again noted about both proximal V4 segments as they course into the cranial vault. Multifocal moderate stenoses involving the proximal left V4 segment again noted. Left V4 remains patent to the takeoff of the left PICA which remains patent and well perfused. Occlusion of the distal left V4 segment beyond the left PICA again noted, stable. On the right, there has been interval placement of a vascular stent extending from the distal right V4 segment across the vertebrobasilar junction into the proximal basilar artery. This traverses the previously seen high-grade stenosis at this level. Grossly patent flow through the stent, although evaluation for possible intra stent stenosis limited by MRA. Stable and fairly robust flow seen within the basilar artery distally. 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  Superior cerebellar arteries remain patent. Right PCA primarily supplied via the basilar. Left PCA supplied via the basilar as well as a robust left posterior communicating artery. Atheromatous disease involving both PCAs with associated severe bilateral P2 stenoses. PCAs remain patent to their distal aspects although demonstrate small vessel atheromatous irregularity. Anatomic variants: None significant. IMPRESSION: MRI HEAD IMPRESSION: 1. 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 Additionally, there are new scattered small volume ischemic infarcts involving the right cerebellum as well as the cortical aspects of the right greater than left occipital lobes. No associated mass effect. Single punctate focus of associated petechial hemorrhage at the right cerebellum as above. 2. Otherwise stable appearance of the brain with atrophy, chronic microvascular ischemic disease, and multiple chronic ischemic infarcts as above. MRA HEAD IMPRESSION: 1. Interval placement of a vascular stent extending from the distal right V4 segment across the vertebrobasilar junction into the proximal basilar artery. Grossly patent flow through the stent, although evaluation for possible intra stent stenosis is limited by MRA. Stable and fairly robust flow seen within the basilar artery distally. 2. Otherwise stable appearance of the intracranial circulation with moderate multifocal stenoses involving the proximal left V4 segment with distal left V4 occlusion, severe right A2/A3 stenosis, severe bilateral P2 stenoses, moderate supraclinoid left ICA stenosis, with mild to moderate long segment right M1 stenosis. Electronically Signed   By: Jeannine Boga M.D.   On: 08/05/2021 02:44   DG Abd 1 View  Result Date: 08/04/2021 CLINICAL DATA:  NG tube placement EXAM: ABDOMEN - 1 VIEW COMPARISON:  10/11/2012  FINDINGS: Tip of NG tube is seen in the region of the antrum of the stomach. Bowel gas pattern is nonspecific. Pelvis is not included in its entirety. IMPRESSION: Tip of NG tube is seen in the stomach. Electronically Signed   By: Elmer Picker M.D.   On: 08/04/2021 17:32   DG Chest Port 1 View  Result Date: 08/04/2021 CLINICAL DATA:  Intubated EXAM: PORTABLE CHEST 1 VIEW COMPARISON:  08/04/2021 FINDINGS: Endotracheal tube tip is about 4.7 cm superior to the carina. Subsegmental atelectasis at the left base. Cardiomegaly. No consolidation, pleural effusion or pneumothorax IMPRESSION: 1. Endotracheal tube tip about 4.7 cm superior to the carina. 2. Cardiomegaly.  Subsegmental atelectasis at the left base. Electronically Signed   By: Donavan Foil M.D.   On: 08/04/2021 15:10   DG Chest Port 1 View  Result Date: 08/04/2021 CLINICAL DATA:  Endotracheal intubation EXAM: PORTABLE CHEST 1 VIEW COMPARISON:  Earlier same day FINDINGS: Endotracheal tube tip 6 cm above the carina. Cardiomegaly persists. Pulmonary venous hypertension, possibly with early interstitial edema. Right lateral chest not included on the image. No evidence of collapse or effusion. IMPRESSION: Endotracheal tube tip 6 cm above the carina. Electronically Signed   By: Nelson Chimes M.D.   On: 08/04/2021 13:42   ECHOCARDIOGRAM COMPLETE  Result Date: 08/04/2021    ECHOCARDIOGRAM REPORT   Patient Name:   JOSEARMANDO KUHNERT Date of Exam: 08/03/2021 Medical Rec #:  [id number]   Height:       67.0 in Accession #:    [id number]  Weight:       185.0 lb Date of Birth:  Jun 11, 1955   BSA:          1.956 m Patient Age:    66 years    BP:           142/79 mmHg  Patient Gender: M           HR:           76 bpm. Exam Location:  [facility] Procedure: 2D Echo, Cardiac Doppler and Color Doppler Indications:     I63.9 Stroke  History:         Patient has no prior history of Echocardiogram examinations.                  Risk Factors:Diabetes, Dyslipidemia, Hypertension and  Former                  Smoker.  Sonographer:     Rosalia Hammers Referring Phys:  [id number] Athena Masse Diagnosing Phys: [place]  1. Left ventricular ejection fraction, by estimation, is 60 to 65%. The left ventricle has normal function. The left ventricle has no regional wall motion abnormalities. There is moderate concentric left ventricular hypertrophy. Left ventricular diastolic parameters are consistent with Grade I diastolic dysfunction (impaired relaxation).  2. Right ventricular systolic function is normal. The right ventricular size is normal.  3. The mitral valve is normal in structure. Trivial mitral valve regurgitation. No evidence of mitral stenosis.  4. The aortic valve is normal in structure. Aortic valve regurgitation is mild. No aortic stenosis is present.  5. The inferior vena cava is normal in size with greater than 50% respiratory variability, suggesting right atrial pressure of 3 mmHg. FINDINGS  Left Ventricle: Left ventricular ejection fraction, by estimation, is 60 to 65%. The left ventricle has normal function. The left ventricle has no regional wall motion abnormalities. The left ventricular internal cavity size was normal in size. There is  moderate concentric left ventricular hypertrophy. Left ventricular diastolic parameters are consistent with Grade I diastolic dysfunction (impaired relaxation). Right Ventricle: The right ventricular size is normal. No increase in right ventricular wall thickness. Right ventricular systolic function is normal. Left Atrium: Left atrial size was normal in size. Right Atrium: Right atrial size was normal in size. Pericardium: There is no evidence of pericardial effusion. Mitral Valve: The mitral valve is normal in structure. Trivial mitral valve regurgitation. No evidence of mitral valve stenosis. Tricuspid Valve: The tricuspid valve is normal in structure. Tricuspid valve regurgitation is trivial. No evidence of tricuspid stenosis. Aortic  Valve: The aortic valve is normal in structure. Aortic valve regurgitation is mild. Aortic regurgitation PHT measures 1577 msec. No aortic stenosis is present. Aortic valve mean gradient measures 5.0 mmHg. Aortic valve peak gradient measures 9.1 mmHg. Aortic valve area, by VTI measures 2.30 cm. Pulmonic Valve: The pulmonic valve was normal in structure. Pulmonic valve regurgitation is not visualized. No evidence of pulmonic stenosis. Aorta: The aortic root is normal in size and structure. Venous: The inferior vena cava is normal in size with greater than 50% respiratory variability, suggesting right atrial pressure of 3 mmHg. IAS/Shunts: No atrial level shunt detected by color flow Doppler.  LEFT VENTRICLE PLAX 2D LVIDd:         3.84 cm   Diastology LVIDs:         2.17 cm   LV e' medial:    6.39 cm/s LV PW:         1.65 cm   LV E/e' medial:  9.6 LV IVS:        1.18 cm   LV e' lateral:   7.83 cm/s LVOT diam:     1.90 cm   LV E/e' lateral: 7.8 LV SV:  74 LV SV Index:   38 LVOT Area:     2.84 cm  RIGHT VENTRICLE RV Basal diam:  3.15 cm RV S prime:     17.40 cm/s TAPSE (M-mode): 2.4 cm LEFT ATRIUM             Index        RIGHT ATRIUM           Index LA diam:        3.20 cm 1.64 cm/m   RA Area:     18.10 cm LA Vol (A2C):   72.4 ml 37.01 ml/m  RA Volume:   47.10 ml  24.08 ml/m LA Vol (A4C):   65.8 ml 33.63 ml/m LA Biplane Vol: 69.3 ml 35.42 ml/m  AORTIC VALVE AV Area (Vmax):    2.10 cm AV Area (Vmean):   2.06 cm AV Area (VTI):     2.30 cm AV Vmax:           151.00 cm/s AV Vmean:          100.000 cm/s AV VTI:            0.323 m AV Peak Grad:      9.1 mmHg AV Mean Grad:      5.0 mmHg LVOT Vmax:         112.00 cm/s LVOT Vmean:        72.600 cm/s LVOT VTI:          0.262 m LVOT/AV VTI ratio: 0.81 AI PHT:            1577 msec  AORTA Ao Root diam: 3.20 cm MITRAL VALVE MV Area (PHT): 3.20 cm     SHUNTS MV Decel Time: 237 msec     Systemic VTI:  0.26 m MV E velocity: 61.30 cm/s   Systemic Diam: 1.90 cm MV A  velocity: 101.00 cm/s MV E/A ratio:  0.61 Shaukat Khan Electronically signed by Neoma Laming Signature Date/Time: 08/04/2021/8:55:17 AM    Final    MR BRAIN WO CONTRAST  Result Date: 08/03/2021 CLINICAL DATA:  65 year old male with severe intracranial atherosclerosis and questionable medullary ischemia on brain MRI yesterday. EXAM: MRI HEAD WITHOUT CONTRAST TECHNIQUE: Multiplanar, multiecho pulse sequences of the brain and surrounding structures were obtained without intravenous contrast. COMPARISON:  CTA head and neck this morning.  Brain MRI yesterday. FINDINGS: Brain: More convincing appearance of ventral medullary restricted diffusion, asymmetrically greater to the left of midline. See series 5, image 12, series 7, images 18 and 19. Mild if any associated T2 and FLAIR hyperintensity there. And no other convincing diffusion restriction despite the CTA findings earlier today. 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 Chronic encephalomalacia right inferior frontal gyrus with hemosiderin. Occasional chronic microhemorrhages elsewhere, right cerebellum (series 13, image 19). No new signal abnormality compared to yesterday. No midline shift, mass effect, evidence of mass lesion, ventriculomegaly, extra-axial collection or acute intracranial hemorrhage. Cervicomedullary junction and pituitary are within normal limits. Vascular: Major intracranial vascular flow voids are stable from yesterday. See also CTA today reported separately. Skull and upper cervical spine: Negative. Visualized bone marrow signal is within normal limits. Sinuses/Orbits: Stable, negative. Other: Visible internal auditory structures appear normal. Mastoids are clear. Negative visible scalp and face. IMPRESSION: 1. Positive for ischemia/infarction of the ventral Medulla as suspected yesterday, slightly greater on the left. No associated hemorrhage or mass effect. 2. 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 These results were communicated to Dr. Rory Percy at 11:06 am on 08/03/2021 by text page via the [facility] messaging system. Electronically Signed   By: Genevie Ann M.D.   On: 08/03/2021 11:06   CT ANGIO HEAD NECK W WO CM  Addendum Date: 08/03/2021   ADDENDUM REPORT: 08/03/2021 09:17 ADDENDUM: Study discussed by telephone with Dr. Amie Portland on 08/03/2021 at 0911 hours. Electronically Signed   By: Genevie Ann M.D.   On: 08/03/2021 09:17   Result Date: 08/03/2021 CLINICAL DATA:  66 year old male with neurologic deficit. Questionable small vessel ischemia of the medulla on brain MRI yesterday. EXAM: CT ANGIOGRAPHY HEAD AND NECK TECHNIQUE: Multidetector CT imaging of the head and neck was performed using the standard protocol during bolus administration of intravenous contrast. Multiplanar CT image reconstructions and MIPs were obtained to evaluate the vascular anatomy. Carotid stenosis measurements (when applicable) are obtained utilizing NASCET criteria, using the distal internal carotid diameter as the denominator. RADIATION DOSE REDUCTION: This exam was performed according to the departmental dose-optimization program which includes automated exposure control, adjustment of the mA and/or kV according to patient size and/or use of iterative reconstruction technique. CONTRAST:  3m OMNIPAQUE IOHEXOL 350 MG/ML SOLN COMPARISON:  Brain MRI yesterday.  Head CT yesterday. FINDINGS: CT HEAD Brain: Bulky Calcified atherosclerosis at the skull base. Stable CT appearance of the brainstem since yesterday. No acute intracranial hemorrhage identified. No midline shift, mass effect, or evidence of intracranial mass lesion. Chronic right inferior frontal gyrus encephalomalacia, patchy bilateral cerebral white matter hypodensity more pronounced on the left. Small chronic  cerebellar infarcts better demonstrated by MRI. No ventriculomegaly or acute cortically based infarct. Calvarium and skull base: No acute osseous abnormality identified. Paranasal sinuses: Visualized paranasal sinuses and mastoids are stable and well aerated. Orbits: Visualized orbits and scalp soft tissues are within normal limits. CTA NECK Skeleton: Periapical dental lucency bilaterally. Ordinary cervical spine degeneration. No acute osseous abnormality identified. Upper chest: Centrilobular and paraseptal emphysema. No superior mediastinal lymphadenopathy. Other neck: 15 mm hypodense right thyroid nodule. Recommend thyroid [place](ref: J Am Coll Radiol. 2015 Feb;12(2): 143-50).Dermal and subcutaneous probable 2.6 cm sebaceous cyst of the left submandibular face (series 13, image 79). Otherwise negative CT appearance of the neck soft tissues. Aortic arch: 3 vessel arch configuration. Mild arch atherosclerosis. Right carotid system: Negative brachiocephalic artery and proximal right CCA. Retropharyngeal course of the right carotid bifurcation with mild calcified plaque. Tortuous right ICA distal to the bulb, no stenosis. Left carotid system: Similar mild tortuosity and atherosclerosis with no hemodynamically significant stenosis. Vertebral arteries: Tortuous proximal right subclavian artery with a mildly kinked appearance in the superior mediastinum. Normal right vertebral artery origin. Patent right vertebral artery to the skull base with no significant plaque or stenosis. Mild to moderate soft and calcified plaque in the proximal left subclavian artery with less than 50 % stenosis with respect to the distal vessel. Mild atherosclerosis and stenosis at the left vertebral artery origin on series 13, image 159. Non dominant left vertebral artery than is patent to the skull base with no additional plaque or stenosis. CTA HEAD Posterior circulation: Bilateral V4 segment calcified plaque. Severe stenosis on the left  (series 12, image 139) upstream of the left PICA origin which remains patent. But the left V4 segment is occluded distal to the left PICA. Contralateral right V4 moderate tandem stenoses upstream of the patent right PICA origin on series 12,  image 130. And severe stenosis, near occlusion of the right vertebrobasilar junction (series 16, image 26). The basilar artery remains patent but is moderately stenotic in the mid basilar segment on series 16, image 24. Distal basilar remains patent along with SCA and PCA origins. Left posterior communicating artery is present, the right is diminutive or absent. a mild to moderate stenosis left PCA origin. And bilateral P2 segment severe stenosis, near occlusion on series 15, image 53. But there is preserved distal PCA branch enhancement bilaterally. Anterior circulation: Both ICA siphons are patent. However, on the left there is bulky soft plaque or thrombus in the supraclinoid ICA (series 14, image 162) this is between normal appearing left ophthalmic and posterior communicating artery origins. Left carotid terminus remains patent. Contralateral right ICA siphon with mild to moderate calcified plaque and mild supraclinoid stenosis. Patent carotid termini, MCA and ACA origins. Tortuous A1 segments. Normal anterior communicating artery. Proximal A2 segments are normal. There is severe right ACA distal A2 segment stenosis, near occlusion on series 17, image 32. Left MCA M1 segment is mildly irregular. Left MCA bifurcation is patent without stenosis. Left MCA branches are mildly irregular. Mild to moderate somewhat long segment right MCA M1 stenosis along 6 mm series 16, image 20. Distal M1 and right MCA trifurcation remain patent. Right MCA branches are mildly irregular. Venous sinuses: Early contrast timing, grossly patent. Anatomic variants: Mildly dominant right vertebral artery. Review of the MIP images confirms the above findings IMPRESSION: 1. Negative for bona fide large  vessel occlusion, but positive for Severe intracranial atherosclerosis with: - Near Occlusion Vertebrobasilar junction, occluded Distal Left V4 segment. - Moderate stenosis Mid Basilar Artery. - Near Occlusion bilateral PCA P2 segments, distal right ACA A2. - bulky soft plaque or thrombus in the Supraclinoid Left ICA, with up to Moderate stenosis. - mild-to-moderate Right MCA M1 stenosis. 2. Stable CT appearance of the brain since yesterday. No new intracranial abnormality. 3. Aortic Atherosclerosis (ICD10-I70.0) and Emphysema (ICD10-J43.9). Electronically Signed: By: Genevie Ann M.D. On: 08/03/2021 09:01   US Carotid Bilateral (at [facility] and [facility] only)  Result Date: 08/03/2021 CLINICAL DATA:  Follow-up examination for stroke. EXAM: BILATERAL CAROTID DUPLEX ULTRASOUND TECHNIQUE: Pearline Cables scale imaging, color Doppler and duplex ultrasound were performed of bilateral carotid and vertebral arteries in the neck. COMPARISON:  Prior Study from 04/03/2014. FINDINGS: Criteria: Quantification of carotid stenosis is based on velocity parameters that correlate the residual internal carotid diameter with NASCET-based stenosis levels, using the diameter of the distal internal carotid lumen as the denominator for stenosis measurement. The following velocity measurements were obtained: RIGHT ICA: 84/10 cm/sec CCA: 96/22 cm/sec SYSTOLIC ICA/CCA RATIO:  1.1 ECA: 64 cm/sec LEFT ICA: 64/16 cm/sec CCA: 297/98 cm/sec SYSTOLIC ICA/CCA RATIO:  0.9 ECA: 117 cm/sec RIGHT CAROTID ARTERY: Mild smooth intimal thickening seen within the visualized right CCA without significant stenosis. Mild heterogeneous mural based echogenic plaque seen about the right carotid bulb. No significant elevation in peak systolic velocity to suggest hemodynamically significant greater than 50% stenosis. Visualized right ICA patent distally without stenosis. RIGHT VERTEBRAL ARTERY:  Patent with antegrade flow. LEFT CAROTID ARTERY: Diffuse smooth intimal thickening seen  within the visualized left CCA without significant stenosis. Mild scattered heterogeneous echogenic plaque about the left carotid bulb/proximal left ICA. No significant elevation in peak systolic velocity to suggest hemodynamically significant greater than 50% stenosis. Visualized left ICA patent distally without visible stenosis. LEFT VERTEBRAL ARTERY:  Patent with antegrade flow. IMPRESSION: 1. Mild atherosclerotic plaque within the carotid  bulbs and proximal ICAs bilaterally, but no hemodynamically significant greater than 50% stenosis. 2. Patent antegrade flow within both vertebral arteries. Electronically Signed   By: Jeannine Boga M.D.   On: 08/03/2021 03:26   MR Cervical Spine Wo Contrast  Result Date: 08/03/2021 CLINICAL DATA:  Initial evaluation for ataxia, right-sided numbness. EXAM: MRI CERVICAL SPINE WITHOUT CONTRAST TECHNIQUE: Multiplanar, multisequence MR imaging of the cervical spine was performed. No intravenous contrast was administered. COMPARISON:  None available. FINDINGS: Alignment: Examination degraded by motion artifact. Straightening with mild reversal of the normal cervical lordosis. No listhesis. Vertebrae: Vertebral body height maintained without acute or chronic fracture. Bone marrow signal intensity within normal limits. No discrete or worrisome osseous lesions or abnormal marrow edema. Cord: Normal signal and morphology. No convincing cord signal changes on this motion degraded exam. Posterior Fossa, vertebral arteries, paraspinal tissues: Unremarkable. Disc levels: C2-C3: Unremarkable. C3-C4: Mild disc bulge with endplate and uncovertebral spurring. Flattening and partial effacement of the ventral thecal sac with resultant mild spinal stenosis. Moderate bilateral C4 foraminal narrowing. C4-C5: Mild disc bulge with uncovertebral spurring. Mild flattening of the ventral thecal sac without significant spinal stenosis. Moderate right with mild left C5 foraminal stenosis. C5-C6:  Minimal disc bulge with uncovertebral spurring. No spinal stenosis. Mild right C6 foraminal narrowing. Left neural foramen remains patent. C6-C7: Minimal annular disc bulge. No spinal stenosis. Foramina remain patent. C7-T1:  Unremarkable. Visualized upper thoracic spine demonstrates no significant finding. IMPRESSION: 1. Motion degraded exam. 2. No acute abnormality within the cervical spine. 3. Mild multilevel cervical spondylosis with resultant mild spinal stenosis at C3-4. Associated moderate bilateral C4 foraminal stenosis, with mild right C5 and C6 foraminal narrowing. Electronically Signed   By: Jeannine Boga M.D.   On: 08/03/2021 01:04   MR BRAIN WO CONTRAST  Result Date: 08/03/2021 CLINICAL DATA:  Initial evaluation for neuro deficit, stroke suspected. EXAM: MRI HEAD WITHOUT CONTRAST TECHNIQUE: Multiplanar, multiecho pulse sequences of the brain and surrounding structures were obtained without intravenous contrast. COMPARISON:  Prior CT from earlier the same day as well as previous MRI from 09/17/2015. FINDINGS: Brain: Generalized age-related cerebral atrophy. Patchy T2/FLAIR hyperintensity involving the periventricular and deep white matter both cerebral hemispheres as well as the pons, most consistent with chronic small vessel ischemic disease, moderately advanced in nature. Area of encephalomalacia of involving parasagittal anterior/inferior right frontal lobe noted at site of prior hemorrhage. Additional smaller foci of encephalomalacia involving the inferior temporal poles bilaterally likely posttraumatic in nature. Few scattered small remote left cerebellar infarcts noted. Subtle area of diffusion signal abnormality involving the ventral medulla measuring up to approximately 8 mm is seen (a series 5, image 12). While this finding could potentially be artifactual, possibly due to prominent vascular calcifications about the adjacent vertebral arteries, a possible small acute ischemic infarct  could be considered. No associated hemorrhage or mass effect. No other evidence for acute or subacute ischemia. No acute intracranial hemorrhage. Single chronic microhemorrhage noted within the right cerebellum, of doubtful significance in isolation. No mass lesion, midline shift or mass effect. No hydrocephalus or extra-axial fluid collection. Pituitary gland and suprasellar region within normal limits. Vascular: Major intracranial vascular flow voids are maintained. Skull and upper cervical spine: Craniocervical junction within normal limits. Bone marrow signal intensity normal. No scalp soft tissue abnormality. Sinuses/Orbits: Globes and orbital soft tissues within normal limits. Paranasal sinuses are largely clear. No mastoid effusion. Other: None. IMPRESSION: 1. 8 mm focus of diffusion signal abnormality involving the ventral medulla, indeterminate. While  this finding could potentially be artifactual in nature, a possible small acute ischemic infarct is difficult to exclude, and could be considered in the correct clinical setting. No associated hemorrhage or mass effect. 2. No other acute intracranial abnormality. 3. Underlying atrophy with moderate chronic small vessel ischemic disease with a few small remote left cerebellar infarcts. Area of encephalomalacia of involving the anterior/inferior right frontal lobe at site of previous hemorrhage. Electronically Signed   By: Jeannine Boga M.D.   On: 08/03/2021 00:59   CT Head Wo Contrast  Result Date: 08/02/2021 CLINICAL DATA:  New right upper extremity tremor. Tingling to the arm. EXAM: CT HEAD WITHOUT CONTRAST TECHNIQUE: Contiguous axial images were obtained from the base of the skull through the vertex without intravenous contrast. RADIATION DOSE REDUCTION: This exam was performed according to the departmental dose-optimization program which includes automated exposure control, adjustment of the mA and/or kV according to patient size and/or use of  iterative reconstruction technique. COMPARISON:  MRI brain 09/17/2015 FINDINGS: Brain: Mild cerebral atrophy. Mild ventricular dilatation consistent with central atrophy. Patchy low-attenuation changes in the deep white matter consistent small vessel ischemic change. Old area of encephalomalacia in the right anterior frontal lobe corresponding to previous intraparenchymal hemorrhage on prior MRI. No acute mass effect or midline shift. No abnormal extra-axial fluid collections. Gray-white matter junctions are distinct. Basal cisterns are not effaced. No acute intracranial hemorrhage. Vascular: No hyperdense vessel or unexpected calcification. Skull: Normal. Negative for fracture or focal lesion. Sinuses/Orbits: Paranasal sinuses and mastoid air cells are clear. Other: None. IMPRESSION: No acute intracranial abnormalities. Chronic atrophy and small vessel ischemic changes. 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 Electronically Signed   By: Lucienne Capers M.D.   On: 08/02/2021 21:20   DG Foot Complete Right  Result Date: 08/02/2021 CLINICAL DATA:  Chronic RIGHT arm and leg pain for since Sunday, RIGHT leg cold to touch, swelling RIGHT great toe EXAM: RIGHT FOOT COMPLETE - 3+ VIEW COMPARISON:  None Available. FINDINGS: Osseous mineralization normal. 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 Remaining joint spaces preserved. No acute fracture, dislocation, or bone destruction. Small plantar calcaneal spur. IMPRESSION: Degenerative changes first MTP joint and small plantar calcaneal spur. No acute osseous abnormalities. Electronically Signed   By: Lavonia Dana M.D.   On: 08/02/2021 20:51     PHYSICAL EXAM  Temp:  [98.9 F (37.2 C)-100.2 F (37.9 C)] 100 F (37.8 C) (07/31 1130) Pulse Rate:  [81-114] 93 (07/31 0951) Resp:  [12-34] 22 (07/31 0750) BP: (139-188)/(69-120) 181/95 (07/31 1340) SpO2:  [98 %-100 %] 100 % (07/31 0750) FiO2 (%):  [40 %] 40 % (07/31  1132)  General - Well nourished, well developed, middle-aged African-American male tracheostomy in place on Precedex and mechanical ventilator in the ICU. Respiratory- Tracheostomy in place, midline. Moderate amount of clear secretions present with inline suctioning. Cough present with suctioning.  Cardiovascular - Regular rate and rhythm.  Neuro -s/p tracheostomy on ventilator.  On precedex for anxiety, eyes open, following midline commands, eyes in mid position but will track throughout visual fields, blinking to visual threat, PERRL. Corneal reflex present, gag and cough present. Breathing over the set ventilator rate.  Nystagmus present in leftward gaze worse in superior leftward gaze. Communicates with blinks. One blink indicates "no" answer and two blinks indicates a "yes" answer. On pain stimulation, no movement in all extremities. No babinski. Patient endorses sensation to light touch intact and symmetric throughout extremities. Patient is unable to participate  in FNF and HKS.  Dense quadriplegia and cannot move extremities.  ASSESSMENT/PLAN Mr. Quante Pettry is a 66 y.o. male with history of remote TBI with ICH, hypertension, hyperlipidemia, diabetes admitted for right leg/foot weakness and pain, right hand numbness, imbalance.  Gradually developed left leg weakness also during admission.  No tPA given due to outside window.  Quadriplegia on examination, attempt at extubation failed with immediate reintubation. Trach and PEG done 7/28. Ongoing hypertension overnight.  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  Ongoing aggressive stroke risk factor management Therapy recommendations: SNF vs. 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 Grossly patent flow through the stent, although evaluation for possible intra stent stenosis is limited by MRA  Respiratory failure Reintubated right after extubation Trach 7/28 Weaning on ventilator- PCCM is primary Concern for evolving pneumonia on 5-day course of ceftriaxone  Diabetes HgbA1c 8.3 goal < 7.0 Controlled CBG monitoring SSI DM education and close PCP follow up  Hypertension Unstable BP goal < 180/105 Intermittently on Cleviprex; labile blood pressures with inline suctioning and stimulation  On home Norvasc 10 mg Previously documented allergy to Lisinopril with facial swelling, Lisinopril discontinued Metoprolol increased to '25mg'$  BID, HCTZ reinitiated with improved renal function, hydralazine PRN added for further blood pressure control Long term BP goal normotensive  Hyperlipidemia Home meds: Lipitor 20 LDL 116, goal < 70 Now on Lipitor 80 Continue statin at discharge  Other Stroke Risk Factors Advanced age Former cigarette smoker quit smoking 33 years ago ETOH use, 1 drink per day  Other Active Problems Remote TBI with traumatic [facility] day #  Continue ventilatory support for respiratory failure and wean as tolerated per  CCM team.  Wean sedation as tolerated.  Continue dual antiplatelet therapy of aspirin and Brilinta for his basilar and vertebral basilar junction stent.  Continue ongoing physical occupational therapy.  Long discussion patient and daughter at the bedside and answered questions.This patient is critically ill and at significant risk of neurological worsening, death and care requires constant monitoring of vital signs, hemodynamics,respiratory and cardiac monitoring, extensive review of multiple databases, frequent neurological assessment, discussion with family, other specialists and medical decision making of high complexity.I have made any additions or clarifications directly to the above note.This critical care time does not reflect procedure time, or teaching time or supervisory time of PA/NP/Med Resident etc but could involve care discussion time.  I spent 30 minutes of neurocritical care time  in the care of  this patient.  Antony Contras, MD  To contact Stroke Continuity  provider, please refer to [email or web address] After hours, contact General Neurology

## 2021-08-09 NOTE — Progress Notes (Signed)
NAME:  Joshua Donovan, MRN:  [id number], DOB:  Mar 05, 1955, LOS: 6 ADMISSION DATE:  08/03/2021, CONSULTATION DATE:  08/04/21 REFERRING MD:  Dr. Gerhard Perches, CHIEF COMPLAINT:  CVA   History of Present Illness:  66 year old man who presented to [facility] 7/24 with right hand numbness, right LE pain and weakness.  PMHx significant for HTN, TBI with prior ICH, DM, and HLD.  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 Patient's weakness worsened to include right arm, right leg and left leg.  CTA Head/Neck revealed multiple severe intracranial stenoses, near-occlusive stenosis of the vertebrobasilar junction, occlusion of the distal left V4, moderate mid basilar stenosis, nearly occlusive bilateral PCA P2 segments, nearly occlusive distal right ACA A2 segments and bulky soft plaque or thrombus in the supraclinoid left ICA with mild to moderate right M1 stenosis.    Subsequently transferred to [facility] for further stroke care/Neuro IR evaluation 7/25.  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  Additionally, occlusion of left V4 distal to origin of PICA and patent bilateral P-comm arteries noted.  Heparin gtt and Plavix initiated.  Remains on Cleviprex gtt for strict SBP parameters.  Patient left intubated but weaning in PACU while awaiting ICU bed.  PCCM consulted for further ventilator management.    Pertinent Medical History:  TBI with ICH, DM, HTN, HLD  Significant Hospital Events: Including procedures, antibiotic start and stop dates in addition to other pertinent events   7/24 presented to [facility], ventral medulla CVA 7/25 tx to [facility] 7/26 cerebral angiogram with stent placement to right vertebro-basilar junction, failed extubation, left radial aline MRI brain> mild extension of stroke, now involving bilateral medial medullary, patent basilar artery and R VBJ  stent  7/28 underwent bedside percutaneous tracheostomy and PEG tube placement, tolerated well 7/29 no acute issues overnight currently on SBT trial this a.m. and tolerating well, Cleviprex resumed earlier this a.m. due to severe hypertension 7/30 Hypertension persist despite adding oral agents, glucose remains elevated as well 7/31 Bleeding around trach site, bright red blood. Copious bloody secretions. Cleviprex off, SBPs 150s-160s. Basal insulin/TF coverage increased. Cefepime deescalated to ceftriaxone. CXR stable.   Interim History / Subjective:  No significant events overnight Low grade temps with Tmax 99.34F Remains slightly hypertensive with SBP 150s-160s off of Cleviprex Bleeding around new tracheostomy this AM, bright red blood Several gauzes changed/copious bloody secretions; low threshold for lido w/ Epi injection if not slowing CXR stable with trach in good position Cefepime deescalated to ceftriaxone Basal insulin + TF coverage increased due to ongoing hyperglycemia to high 200s  Objective:  Blood pressure 139/83, pulse 90, temperature 99.6 F (37.6 C), temperature source Oral, resp. rate (!) 22, height _0  (1.702 m), weight 84.4 kg, SpO2 100 %.    Vent Mode: PSV;CPAP FiO2 (%):  [40 %] 40 % Set Rate:  [18 bmp] 18 bmp Vt Set:  [520 mL] 520 mL PEEP:  [5 cmH20] 5 cmH20 Pressure Support:  [10 cmH20] 10 cmH20 Plateau Pressure:  [14 cmH20-17 cmH20] 14 cmH20   Intake/Output Summary (Last 24 hours) at 08/09/2021 0810 Last data filed at 08/09/2021 0700 Gross per 24 hour  Intake 2541.72 ml  Output 2275 ml  Net 266.72 ml    Filed Weights   08/04/21 0914 08/07/21 0500 08/08/21 1445  Weight: 83.9 kg 89.1 kg 84.4 kg   Physical Examination: General: Acutely ill-appearing middle-aged man in NAD.  Communicating via eye blinks. HEENT: [place]/AT, anicteric sclera, PERRL, moist mucous membranes. Tracheostomy in place with surrounding oozing of bright red blood. Bloody secretions noted  with in-line suctioning and coming from oropharynx. Clear-yellow secretions from nares. Neuro:  Awake, appears oriented though unable to fully assess. Communicating with blinking (2 yes, 1 no).  Responds to verbal stimuli. Following commands consistently. No spontaneous movement of extremities noted. +Corneal, +Cough, and +Gag  CV: Irregular rhythm, rate 80s-90s, no m/g/r. PULM: Breathing even and unlabored on vent via trach PSV 8/5, FiO2 40%). Lung fields coarse throughout. GI: Soft, nontender, nondistended. Normoactive bowel sounds. Extremities: No significant LE edema noted. Skin: Warm/dry, no rashes.  Resolved Hospital Problem List:   Assessment & Plan:   Acute hypoxemic respiratory failure in the setting of stroke S/p percutaneous tracheostomy and PEG tube placement 7/28. Peritracheostomy bleeding Enterobacter pneumonia Increased tracheal secretions 7/28 resulting in BAL; empiric antibiotics started. Resp Cx with enterobacter aerogenes. Deescalated from cefepime to ceftriaxone. - Continue full vent support (4-8cc/kg IBW) - Wean FiO2 for O2 sat > 90% - Daily WUA/SBT, wean as tolerated; hopeful to transition to trach collar once peritrach bleeding has ceased - VAP bundle - Trach care per protocol - Consider injection of lido with Epi/topical for hemostasis if oozing does not slow; may need bronch from above to evaluate for active bleeding if oozing does not cease - Pulmonary hygiene - PAD protocol for sedation: Precedex and Fentanyl for goal RASS 0 to -1 - Intermittent CXR - Continue ceftriaxone x 7 total days  Brainstem stroke involving the ventral medulla with basilar artery stensois s/p stent placement to right vertebro-basilar junction  Severe intracranial atherosclerosis Ongoing stroke-associated quadriplegia. - Management per Neuro/Stroke - SBP goal < 180 - Further imaging per Neuro - ASA/Brilinta, monitor in the setting of active peritrach bleeding - Seizure  precautions - Neuroprotective measures: HOB > 30 degrees, normoglycemia, normothermia, electrolytes WNL - Will require LTACH vs. SNF at discharge, respiratory/vent status pending  HTN emergency Grade 1 diastolic dysfunction seen on echocardiogram -TTE EF 60-65%, no WMA, G1DD, normal RV/ valves History of hyperlipidemia - SBP goal < 180 - Continue home lisinopril, Lopressor, amlodipine - ASA/Brilinta as above - Continue statin  T2DM with hyperglycemia A1c 8.3 - Basal increased to 16U BID per pharmacy recs - TF coverage 4U Q4H - SSI, resistance scale - CBGs Q4H, goal 140-180  ?Hx of ETOH abuse Reported 1 beer a day. - Monitor for signs/symptoms of withdrawal - Precedex, wean as able - CIWA - Continue thiamine, folate, MV  Best Practice (right click and "Reselect all SmartList Selections" daily)   Diet/type: NPO;  TF per RD recs> restart 4hrs post PEG.  Scheduled bowel regimen DVT prophylaxis: prophylactic heparin  GI prophylaxis: PPI Lines: N/A Foley:  Yes, and it is no longer needed and removal ordered > frequent bladder scans to monitor for retention Code Status:  full code Last date of multidisciplinary goals of care discussion: Full code. Discussed with daughter at bedside 7/31AM.  Critical care time: 42 minutes   The patient is critically ill with multiple organ system failure and requires high complexity decision making for assessment and support, frequent evaluation and titration of therapies, advanced monitoring, review of radiographic studies and interpretation of complex data.   Critical Care Time devoted to patient care services, exclusive of separately billable procedures, described in this note is 42 minutes.  Lestine Mount, PA-C [facility] 08/09/21 8:10 AM  Please see [email or web address] for pager  details.  From 7A-7P if no response, please call [phone] After hours, please call ELink [phone]

## 2021-08-09 NOTE — Progress Notes (Signed)
RT arrived at bedside for trach assessment, pt was found with blood  around tracheostomy. RT and RN cleaned and dried site and new dressing was applied. Trach placement was confirmed with positive color change, no WOB or resp. distress noted at this time, pt resting comfortably on vent, CCM notified, no new orders given, RT will monitor.     08/09/21 0750  Tracheostomy Shiley Flexible 8 mm Cuffed  Placement Date/Time: 08/06/21 1412   Placed By: ICU physician  Brand: Shiley Flexible  Size (mm): 8 mm  Style: Cuffed  Status Secured  Site Assessment (S)  Bleeding  Site Care Cleansed;Dried;Dressing applied  Inner Cannula Care Changed/new  Ties Assessment Clean, Dry, Secure  Cuff Pressure (cm H2O) Green OR 18-26 CmH2O  Tracheostomy Equipment at bedside Yes and checklist posted at head of bed

## 2021-08-09 NOTE — Progress Notes (Signed)
[facility] ADULT ICU REPLACEMENT PROTOCOL   The patient does apply for the [facility] Adult ICU Electrolyte Replacment Protocol based on the criteria listed below:   1.Exclusion criteria: TCTS patients, ECMO patients, and Dialysis patients 2. Is GFR >/= 30 ml/min? Yes.    Patient's GFR today is >60 3. Is SCr </= 2? Yes.   Patient's SCr is 0.70 mg/dL 4. Did SCr increase >/= 0.5 in 24 hours? No. 5.Pt's weight >40kg  Yes.   6. Abnormal electrolyte(s): K+ 3.4  7. Electrolytes replaced per protocol 8.  Call MD STAT for K+ </= 2.5, Phos </= 1, or Mag </= 1 Physician:  n/a  Joshua Donovan 08/09/2021 4:35 AM

## 2021-08-09 NOTE — Progress Notes (Signed)
PT Cancellation Note  Patient Details Name: Joshua Donovan MRN: [id number] DOB: 1956/01/07   Cancelled Treatment:    Reason Eval/Treat Not Completed: Medical issues which prohibited therapy. Per discussion with RN, pt with increased bleeding from trach this morning, will hold on PT at this time, but continue to follow and tx as appropriate.   [name], PT, DPT   Acute Rehabilitation Department   [name] 08/09/2021, 8:36 AM

## 2021-08-09 NOTE — Progress Notes (Signed)
PT Cancellation Note  Patient Details Name: Joshua Donovan MRN: [id number] DOB: March 16, 1955   Cancelled Treatment:    Reason Eval/Treat Not Completed: Medical issues which prohibited therapy continued this afternoon with continued bleeding from trach and eventual need for intubation. Will continue to follow and re-evaluate as appropriate.   [name], PT, DPT   Acute Rehabilitation Department    [name] 08/09/2021, 5:59 PM

## 2021-08-09 NOTE — Procedures (Signed)
Intubation Procedure Note  Vannie Hochstetler  [id number]  01/23/1955  Date:08/09/21  Time:5:03 PM   Provider Performing:Bella Brummet    Procedure: Intubation (10932)  Indication(s) Respiratory Failure  Consent Unable to obtain consent due to emergent nature of procedure.   Anesthesia Etomidate, Versed, and Fentanyl   Time Out Verified patient identification, verified procedure, site/side was marked, verified correct patient position, special equipment/implants available, medications/allergies/relevant history reviewed, required imaging and test results available.   Sterile Technique Usual hand hygeine, masks, and gloves were used   Procedure Description Patient positioned in bed supine.  Sedation given as noted above.  Patient was intubated with endotracheal tube using Glidescope.  View was Grade 2 only posterior commissure .  Number of attempts was  2 .  Colorimetric CO2 detector was consistent with tracheal placement.  Difficult intubation due to presence of bloody secretions, small anterior larynx and peri-laryngeal edema.   Complications/Tolerance None; patient tolerated the procedure well. Chest X-ray is ordered to verify placement.   EBL none   Specimen(s) None   Kipp Brood, MD [facility] ICU Physician [place]  Pager: [phone] Or Epic Secure Chat After hours: [phone].  08/09/2021, 5:05 PM

## 2021-08-09 NOTE — Progress Notes (Signed)
RT X2  at bedside to assist with bronchoscopy with CCM. During bronch MD decided to exchange # 8 Shiley flex for # 8 Shiley distal XLT. However MD wasn't able to insert #8 shiley XLT. Pt was oxygenated with BVM vitals remained stable. MD intubated with 7.5 ETT and pt was placed back on vent. MD packed and dressed stoma site. Pt tolerating well at this time, RN at bedside, MD at bedside.

## 2021-08-09 NOTE — Procedures (Signed)
Bronchoscopy Procedure Note  Joshua Donovan  [id number]  06/10/1955  Date:08/09/21  Time:4:55 PM   Provider Performing:Mariel Lukins   Procedure(s):  Flexible Bronchoscopy (31438)  Indication(s) Bleeding from tracheostomy site.   Consent Risks of the procedure as well as the alternatives and risks of each were explained to the patient and/or caregiver.  Consent for the procedure was obtained and is signed in the bedside chart  Anesthesia Fentanyl 249mg.   Time Out Verified patient identification, verified procedure, site/side was marked, verified correct patient position, special equipment/implants available, medications/allergies/relevant history reviewed, required imaging and test results available.   Sterile Technique Usual hand hygiene, masks, gowns, and gloves were used   Procedure Description Bronchoscope advanced through tracheostomy tube and into airway.  Airways were examined down to subsegmental level with findings noted below.    Findings: blood tinged secretions noted, likely from irritation at the tracheostomy entry site as anterior aspect of trach could be seen abutting the wall of the trachea.    Complications/Tolerance None; patient tolerated the procedure well. Chest X-ray is not needed post procedure.   EBL Minimal   Specimen(s) None  RKipp Brood MD [facility] [place] Pager: [phone] Epic Secure Chat After hours: [phone].  08/09/2021, 4:58 PM

## 2021-08-10 LAB — CBC WITH DIFFERENTIAL/PLATELET
Abs Immature Granulocytes: 0.04 10*3/uL (ref 0.00–0.07)
Basophils Absolute: 0.1 10*3/uL (ref 0.0–0.1)
Basophils Relative: 1 %
Eosinophils Absolute: 0.2 10*3/uL (ref 0.0–0.5)
Eosinophils Relative: 3 %
HCT: 37.7 % — ABNORMAL LOW (ref 39.0–52.0)
Hemoglobin: 12 g/dL — ABNORMAL LOW (ref 13.0–17.0)
Immature Granulocytes: 1 %
Lymphocytes Relative: 13 %
Lymphs Abs: 1 10*3/uL (ref 0.7–4.0)
MCH: 29.1 pg (ref 26.0–34.0)
MCHC: 31.8 g/dL (ref 30.0–36.0)
MCV: 91.5 fL (ref 80.0–100.0)
Monocytes Absolute: 1 10*3/uL (ref 0.1–1.0)
Monocytes Relative: 14 %
Neutro Abs: 5 10*3/uL (ref 1.7–7.7)
Neutrophils Relative %: 68 %
Platelets: 325 10*3/uL (ref 150–400)
RBC: 4.12 MIL/uL — ABNORMAL LOW (ref 4.22–5.81)
RDW: 14.4 % (ref 11.5–15.5)
WBC: 7.3 10*3/uL (ref 4.0–10.5)
nRBC: 0 % (ref 0.0–0.2)

## 2021-08-10 LAB — COMPREHENSIVE METABOLIC PANEL
ALT: 50 U/L — ABNORMAL HIGH (ref 0–44)
AST: 30 U/L (ref 15–41)
Albumin: 2.5 g/dL — ABNORMAL LOW (ref 3.5–5.0)
Alkaline Phosphatase: 30 U/L — ABNORMAL LOW (ref 38–126)
Anion gap: 5 (ref 5–15)
BUN: 23 mg/dL (ref 8–23)
CO2: 30 mmol/L (ref 22–32)
Calcium: 9 mg/dL (ref 8.9–10.3)
Chloride: 108 mmol/L (ref 98–111)
Creatinine, Ser: 0.73 mg/dL (ref 0.61–1.24)
GFR, Estimated: >60 mL/min (ref >60)
Glucose, Bld: 258 mg/dL — ABNORMAL HIGH (ref 70–99)
Potassium: 4 mmol/L (ref 3.5–5.1)
Sodium: 143 mmol/L (ref 135–145)
Total Bilirubin: 0.5 mg/dL (ref 0.3–1.2)
Total Protein: 6 g/dL — ABNORMAL LOW (ref 6.5–8.1)

## 2021-08-10 LAB — GLUCOSE, CAPILLARY
Glucose-Capillary: 189 mg/dL — ABNORMAL HIGH (ref 70–99)
Glucose-Capillary: 223 mg/dL — ABNORMAL HIGH (ref 70–99)
Glucose-Capillary: 248 mg/dL — ABNORMAL HIGH (ref 70–99)
Glucose-Capillary: 140 mg/dL — ABNORMAL HIGH (ref 70–99)
Glucose-Capillary: 204 mg/dL — ABNORMAL HIGH (ref 70–99)
Glucose-Capillary: 232 mg/dL — ABNORMAL HIGH (ref 70–99)

## 2021-08-10 NOTE — Progress Notes (Addendum)
STROKE TEAM PROGRESS NOTE   SUBJECTIVE (INTERVAL HISTORY)  Daughter is at bedside  On Precedex and fentanyl drip.  Significant bleeding from tracheostomy yesterday with flexible bronchoscopy noting blood tinged secretions thought to be due to irritation at tracheostomy entry site. Patient was orally re-intubated 2/2 inability to pass tracheostomy tube through stoma.  ENT plan to replace the tracheostomy in the OR tomorrow.  Blood pressure well controlled.  Patient remains on ventilatory support and light sedation. Neurological exam unchanged.  Remains quadriplegic. Will open eyes to voice and follow commands with mouth. Tracheostomy revision planned for tomorrow.   OBJECTIVE Temp:  [97.1 F (36.2 C)-100 F (37.8 C)] 97.1 F (36.2 C) (08/01 1200) Pulse Rate:  [60-126] 62 (08/01 1300) Cardiac Rhythm: Normal sinus rhythm (08/01 0800) Resp:  [10-40] 18 (08/01 1300) BP: (95-229)/(62-135) 118/73 (08/01 1300) SpO2:  [89 %-100 %] 97 % (08/01 1300) FiO2 (%):  [40 %] 40 % (08/01 1130)  Recent Labs  Lab 08/09/21 1923 08/09/21 2314 08/10/21 0320 08/10/21 0737 08/10/21 1138  GLUCAP 190* 241* 189* 223* 248*   Recent Labs  Lab 08/05/21 1619 08/06/21 0512 08/06/21 1812 08/07/21 0225 08/08/21 0227 08/09/21 0157 08/10/21 0623  NA  --  139  --  139 141 143 143  K  --  4.3  --  3.5 3.8 3.4* 4.0  CL  --  106  --  104 106 105 108  CO2  --  22  --  _0 GLUCOSE  --  216*  --  290* 258* 212* 258*  BUN  --  11  --  _1 CREATININE  --  0.77  --  0.72 0.69 0.70 0.73  CALCIUM  --  8.2*  --  8.7* 8.6* 9.2 9.0  MG 3.0* 2.6* 2.1 2.0  --   --   --   PHOS 2.9 3.3 2.9 3.2  --   --   --    Recent Labs  Lab 08/10/21 0623  AST 30  ALT 50*  ALKPHOS 30*  BILITOT 0.5  PROT 6.0*  ALBUMIN 2.5*   Recent Labs  Lab 08/05/21 0531 08/06/21 0512 08/07/21 0225 08/09/21 0157 08/10/21 0623  WBC 12.5* 9.0 8.9 9.6 7.3  NEUTROABS  --   --   --   --  5.0  HGB 13.6 12.6* 13.2 13.3 12.0*   HCT 40.3 38.5* 40.4 41.3 37.7*  MCV 87.4 89.1 89.2 89.4 91.5  PLT 303 270 291 343 325   No results for input(s): "CKTOTAL", "CKMB", "CKMBINDEX", "TROPONINI" in the last 168 hours. No results for input(s): "LABPROT", "INR" in the last 72 hours. No results for input(s): "COLORURINE", "LABSPEC", "PHURINE", "GLUCOSEU", "HGBUR", "BILIRUBINUR", "KETONESUR", "PROTEINUR", "UROBILINOGEN", "NITRITE", "LEUKOCYTESUR" in the last 72 hours.  Invalid input(s): "APPERANCEUR"     Component Value Date/Time   CHOL 203 (H) 08/03/2021 0347   TRIG 175 (H) 08/03/2021 0347   HDL 52 08/03/2021 0347   CHOLHDL 3.9 08/03/2021 0347   VLDL 35 08/03/2021 0347   LDLCALC 116 (H) 08/03/2021 0347   Lab Results  Component Value Date   HGBA1C 8.3 (H) 08/03/2021   No results found for: "LABOPIA", "COCAINSCRNUR", "LABBENZ", "AMPHETMU", "THCU", "LABBARB"  No results for input(s): "ETH" in the last 168 hours.  I have personally reviewed the radiological images below and agree with the radiology interpretations.  DG CHEST PORT 1 VIEW  Result Date: 08/09/2021 CLINICAL DATA:  Intubation. EXAM: PORTABLE CHEST 1 VIEW COMPARISON:  Same day. FINDINGS: The heart size and mediastinal contours are within normal limits. Endotracheal tube is noted in grossly good position. Minimal bibasilar subsegmental atelectasis is noted. The visualized skeletal structures are unremarkable. IMPRESSION: Endotracheal tube in grossly good position. Electronically Signed   By: Marijo Conception M.D.   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 He presented with progressively worsening weakness in the right lower extremity, left lower  extremity, right upper extremity, and left upper extremity. On the morning of the procedure, patient was flaccid at the right-sided extremities and had severe weakness in the left-sided extremities (grade 1/5). 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 There was occlusion of the left vertebral artery seen distal to the origin of the PICA. 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 EXAM: 1.  Biplane right carotid and cerebral DSA angiogram. 2.  Biplane left common carotid and cerebral DSA cerebral angiogram 3. Indirect left vertebral and cerebral angiogram with tip of the catheter at the origin of the left vertebral artery 4.  Right subclavian arteriogram. 5. Selective catheterization of the right vertebral artery, biplane vertebral and cerebral DSA cerebral angiogram. 6. Selective catheterization of the right vertebral artery by using the 088 neuron max guide catheter, 5 French AXS catalyst catheter as an intermediate catheter and 021 phenom microcatheter with a 014 Arostotle microwire. 7. High magnification biplane cerebral angiogram and exchange of the microcatheter over an exchange length 014 zoom wire into an Apex 2.25 x 15 mm monorail balloon catheter and angioplasty of the severely 90% stenotic right vertebrobasilar junction. 8. Resolute onyx 3 mm x 22 mm stent placement at the site of the stenosis. 9. Post stent biplane DSA cerebral angiography of the right vertebral artery injection. 10. Right common femoral arteriogram in the RAO projection after withdrawing the sheath with its tip in the external iliac artery and arteriogram Attending: Dr. Frazier Richards Assistant: Dr. Luanne Bras COMPARISON:  CORRELATION IS MADE WITH CTA OF THE HEAD AND NECK DATED August 03, 2021 MEDICATIONS: The antibiotic was  administered within 1 hour of the procedure Injection Ancef 2 g Injection Integrilin 4.5 mg at 1.5 mg increments intra-arterially through the right vertebral artery Heparin 3500 units ANESTHESIA/SEDATION: Anesthesia was provided by the anesthesia team. Please refer to the anesthesia notes for detailed information. CONTRAST:  126 mL of Omnipaque 300 FLUOROSCOPY: Fluoroscopy Time: 48 minutes 48 seconds (3499 mGy). COMPLICATIONS: None immediate. TECHNIQUE: Informed written consent was obtained from the patient and his daughter after a thorough discussion of the procedural risks including but not limited to approximately 5% bleeding, hematoma, vascular injury, worsening of the stroke, ventilator dependency etc. Benefits and alternatives. All questions were addressed. Maximal Sterile Barrier Technique was utilized including caps, mask, sterile gowns, sterile gloves, sterile drape, hand hygiene and skin antiseptic. A timeout was performed prior to the initiation of the procedure. PROCEDURE: The right groin was prepped and draped in the usual sterile fashion. Thereafter using modified Seldinger technique, transfemoral access into the right common femoral artery was obtained without difficulty. Over a 0.035  inch guidewire, an 8 French x 25 cm pinnacle sheath was inserted. Through this, and also over 0.035 inch roadrunner wire, a 5 [place] JB 1 catheter was advanced to the aortic arch region and selectively positioned in the right common carotid artery, and biplane DSA carotid and cerebral angiogram was obtained. Next, with the catheter tip in the left common carotid artery, biplane DSA carotid and cerebral Angiography of the head was obtained. The, selective catheterization of the left subclavian artery followed by angiogram was performed. With the tip of the catheter at the origin of the left vertebral artery, indirect left vertebral and biplane DSA cerebral angiography of left subclavian arterial injection was performed.  Next, selective catheterization of the right subclavian artery was performed and the catheter was advanced into the proximal subclavian artery. Next, the wire was exchanged for an exchange length 035 Rosen wire. The JB 1 catheter was removed and 088 neuron max guide catheter and 5 French AXS catalyst catheter were advanced and selective catheterization of the left vertebral artery followed by vertebral and cerebral angiogram was performed. Next, under high magnification angiography, and under roadmap guidance, phenom 21 microcatheter and 0 1 for restarting microwire were advanced into the left posterior cerebral artery and angiogram was performed. Next, the wire was exchanged for a 014 zoom exchange length wire. Subsequently, we selected 2.25 x 15 mm Apex monorail microcatheter and under roadmap guidance, angioplasty of the severely stenotic right vertebrobasilar artery was performed. Post angioplasty angiogram was performed. Next, the balloon was removed. Measurements were made. Next we selected 3 mm x 22 mm resolute onyx stent and the stent was deployed at the right vertebrobasilar stenotic site. The stent was gently dilated and was inflated up to 10 atmospheric pressure. Next, post stent biplane DSA cerebral angiogram was performed. At this point in time, we felt the goal of the procedure was obtained. The catheter was removed. Femoral access site was further sterilely cleaned and the femoral sheath was withdrawn with its tip in the right external iliac artery, femoral angiogram was performed in the RAO projection and the femoral access site was closed by using an 8 [place] Angio-Seal. Patient tolerated the procedure well and was transferred to the ICU intubated in stable condition. No immediate complications. FINDINGS: Right CCA: The right common carotid arteriogram demonstrates the right external carotid artery and its major branches to be widely patent. Right ICA: The right internal carotid artery at the bulb  to the cranial skull base opacifies normally. No significant stenosis. Right Intracranial: The petrous and cavernous segments are widely patent. Mild 20-30% stenosis at the supraclinoid ICA. The right middle cerebral artery and the right anterior cerebral artery opacify normally into the capillary and venous phases. Mild-to-moderate atheromatous disease with about 30% stenosis in the right M1 segment. There is severe about 70% stenosis of the right pericallosal branch of the ACA seen in the proximal aspect. There is cross filling via the anterior communicating artery seen with opacification of the left anterior cerebral artery. PCOM is patent with faint opacification of the right posterior cerebral artery. Right Sided Anterior Venous: The venous phase demonstrates preferential egress of contrast into the right transverse sinus, the sigmoid sinus and the right internal jugular vein. There is no early venous shunting into the venous structures at the skull base or intracranially into the dural sinuses. Left CCA: The left common carotid arteriogram demonstrates the left external carotid artery and its major branches to be widely patent. Left ICA: The left internal carotid  artery at the bulb to the cranial skull base opacifies normally. Mild atheromatous disease at the origin of the ICA. There is mild stenosis seen about 2 cm from the origin of the left ICA measuring 42 percent by the NASCET criteria. Left Intracranial: The petrous, cavernous and supraclinoid segments are widely patent. The left middle cerebral artery and the left anterior cerebral artery opacify normally into the capillary and venous phases. There is a prominent PCOM opacifying the bilateral posterior cerebral arteries via the distal basilar artery. Left Sided Anterior Venous: The venous phase demonstrates preferential egress of contrast into the right transverse sinus, the sigmoid sinus and the right internal jugular vein. There is opacification of  the left transverse sigmoid sinuses and internal jugular veins seen as well. There is no early venous shunting into the venous structures at the skull base or intracranially into the dural sinuses. Indirect left vertebral and cerebral angiogram with injection of the left subclavian artery demonstrate faint opacification of the left vertebral artery. 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 High magnification biplane DSA cerebral angiography of right vertebral artery injection demonstrate severe about 90% stenosis at the right vertebrobasilar junction in length of about 7 mm. Post angioplasty angiogram demonstrate significant residual stenoses. Post stent angiogram demonstrate complete expansion of the stent with no residual stenosis. There is about 40-50% stenosis seen at the mid basilar artery. Right PICA, bilateral superior cerebellar, AICA and posterior cerebral arteries show moderate atheromatous disease. No significant stenosis. IMPRESSION: 1. Right vertebral and cerebral angiogram demonstrated about 90% stenosis at the vertebrobasilar junction. Successful angioplasty and stent placement without significant residual stenosis. 2.  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 3. 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 4. Mild atheromatous disease in the right intracranial ICA with 20-30% stenosis at the supraclinoid ICA and 30% stenosis in the right M1 segment. There is about 70% stenoses of the right pericallosal branch of the ACA seen in the A3 segment. 5.  Bilateral P comms are patent with the left PCOM being prominent. PLAN: 1.  Blood pressure goals between 120-140 mm Hg. 2.  Patient to be on dual antiplatelet agents for 6 months. Electronically Signed   By: Frazier Richards M.D.   On: 08/09/2021 10:28   IR ANGIO VERTEBRAL SEL VERTEBRAL UNI R MOD SED  Result Date: 08/09/2021 INDICATION: Quadriparesis/quadriplegia with severe 90%  stenosis of the right vertebrobasilar junction CLINICAL DATA:  66 year male patient with past medical history of traumatic brain injury with ICH, diabetes, hypertension and hyperlipidemia. He presented with progressively worsening weakness in the right lower extremity, left lower extremity, right upper extremity, and left upper extremity. On the morning of the procedure, patient was flaccid at the right-sided extremities and had severe weakness in the left-sided extremities (grade 1/5). 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 There was occlusion of the left vertebral artery seen distal to the origin of the PICA. 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 EXAM: 1.  Biplane right carotid and cerebral DSA angiogram. 2.  Biplane left common carotid and cerebral DSA cerebral angiogram 3. Indirect left vertebral and cerebral angiogram with tip of the catheter at the origin of the left vertebral artery 4.  Right subclavian arteriogram.  5. Selective catheterization of the right vertebral artery, biplane vertebral and cerebral DSA cerebral angiogram. 6. Selective catheterization of the right vertebral artery by using the 088 neuron max guide catheter, 5 French AXS catalyst catheter as an intermediate catheter and 021 phenom microcatheter with a 014 Arostotle microwire. 7. High magnification biplane cerebral angiogram and exchange of the microcatheter over an exchange length 014 zoom wire into an Apex 2.25 x 15 mm monorail balloon catheter and angioplasty of the severely 90% stenotic right vertebrobasilar junction. 8. Resolute onyx 3 mm x 22 mm stent placement at the site of the stenosis. 9. Post stent biplane DSA cerebral angiography of the right vertebral artery injection. 10. Right common femoral arteriogram  in the RAO projection after withdrawing the sheath with its tip in the external iliac artery and arteriogram Attending: Dr. Frazier Richards Assistant: Dr. Luanne Bras COMPARISON:  CORRELATION IS MADE WITH CTA OF THE HEAD AND NECK DATED August 03, 2021 MEDICATIONS: The antibiotic was administered within 1 hour of the procedure Injection Ancef 2 g Injection Integrilin 4.5 mg at 1.5 mg increments intra-arterially through the right vertebral artery Heparin 3500 units ANESTHESIA/SEDATION: Anesthesia was provided by the anesthesia team. Please refer to the anesthesia notes for detailed information. CONTRAST:  126 mL of Omnipaque 300 FLUOROSCOPY: Fluoroscopy Time: 48 minutes 48 seconds (3499 mGy). COMPLICATIONS: None immediate. TECHNIQUE: Informed written consent was obtained from the patient and his daughter after a thorough discussion of the procedural risks including but not limited to approximately 5% bleeding, hematoma, vascular injury, worsening of the stroke, ventilator dependency etc. Benefits and alternatives. All questions were addressed. Maximal Sterile Barrier Technique was utilized including caps, mask, sterile gowns, sterile gloves, sterile drape, hand hygiene and skin antiseptic. A timeout was performed prior to the initiation of the procedure. PROCEDURE: The right groin was prepped and draped in the usual sterile fashion. Thereafter using modified Seldinger technique, transfemoral access into the right common femoral artery was obtained without difficulty. Over a 0.035 inch guidewire, an 8 French x 25 cm pinnacle sheath was inserted. Through this, and also over 0.035 inch roadrunner wire, a 5 [place] JB 1 catheter was advanced to the aortic arch region and selectively positioned in the right common carotid artery, and biplane DSA carotid and cerebral angiogram was obtained. Next, with the catheter tip in the left common carotid artery, biplane DSA carotid and cerebral Angiography of the head was obtained.  The, selective catheterization of the left subclavian artery followed by angiogram was performed. With the tip of the catheter at the origin of the left vertebral artery, indirect left vertebral and biplane DSA cerebral angiography of left subclavian arterial injection was performed. Next, selective catheterization of the right subclavian artery was performed and the catheter was advanced into the proximal subclavian artery. Next, the wire was exchanged for an exchange length 035 Rosen wire. The JB 1 catheter was removed and 088 neuron max guide catheter and 5 French AXS catalyst catheter were advanced and selective catheterization of the left vertebral artery followed by vertebral and cerebral angiogram was performed. Next, under high magnification angiography, and under roadmap guidance, phenom 21 microcatheter and 0 1 for restarting microwire were advanced into the left posterior cerebral artery and angiogram was performed. Next, the wire was exchanged for a 014 zoom exchange length wire. Subsequently, we selected 2.25 x 15 mm Apex monorail microcatheter and under roadmap guidance, angioplasty of the severely stenotic right vertebrobasilar artery was performed. Post angioplasty angiogram was performed. Next, the balloon  was removed. Measurements were made. Next we selected 3 mm x 22 mm resolute onyx stent and the stent was deployed at the right vertebrobasilar stenotic site. The stent was gently dilated and was inflated up to 10 atmospheric pressure. Next, post stent biplane DSA cerebral angiogram was performed. At this point in time, we felt the goal of the procedure was obtained. The catheter was removed. Femoral access site was further sterilely cleaned and the femoral sheath was withdrawn with its tip in the right external iliac artery, femoral angiogram was performed in the RAO projection and the femoral access site was closed by using an 8 [place] Angio-Seal. Patient tolerated the procedure well and was  transferred to the ICU intubated in stable condition. No immediate complications. FINDINGS: Right CCA: The right common carotid arteriogram demonstrates the right external carotid artery and its major branches to be widely patent. Right ICA: The right internal carotid artery at the bulb to the cranial skull base opacifies normally. No significant stenosis. Right Intracranial: The petrous and cavernous segments are widely patent. Mild 20-30% stenosis at the supraclinoid ICA. The right middle cerebral artery and the right anterior cerebral artery opacify normally into the capillary and venous phases. Mild-to-moderate atheromatous disease with about 30% stenosis in the right M1 segment. There is severe about 70% stenosis of the right pericallosal branch of the ACA seen in the proximal aspect. There is cross filling via the anterior communicating artery seen with opacification of the left anterior cerebral artery. PCOM is patent with faint opacification of the right posterior cerebral artery. Right Sided Anterior Venous: The venous phase demonstrates preferential egress of contrast into the right transverse sinus, the sigmoid sinus and the right internal jugular vein. There is no early venous shunting into the venous structures at the skull base or intracranially into the dural sinuses. Left CCA: The left common carotid arteriogram demonstrates the left external carotid artery and its major branches to be widely patent. Left ICA: The left internal carotid artery at the bulb to the cranial skull base opacifies normally. Mild atheromatous disease at the origin of the ICA. There is mild stenosis seen about 2 cm from the origin of the left ICA measuring 42 percent by the NASCET criteria. Left Intracranial: The petrous, cavernous and supraclinoid segments are widely patent. The left middle cerebral artery and the left anterior cerebral artery opacify normally into the capillary and venous phases. There is a prominent PCOM  opacifying the bilateral posterior cerebral arteries via the distal basilar artery. Left Sided Anterior Venous: The venous phase demonstrates preferential egress of contrast into the right transverse sinus, the sigmoid sinus and the right internal jugular vein. There is opacification of the left transverse sigmoid sinuses and internal jugular veins seen as well. There is no early venous shunting into the venous structures at the skull base or intracranially into the dural sinuses. Indirect left vertebral and cerebral angiogram with injection of the left subclavian artery demonstrate faint opacification of the left vertebral artery. 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 High magnification biplane DSA cerebral angiography of right vertebral artery injection demonstrate severe about 90% stenosis at the right vertebrobasilar junction in length of about 7 mm. Post angioplasty angiogram demonstrate significant residual stenoses. Post stent angiogram demonstrate complete expansion of the stent with no residual stenosis. There is about 40-50% stenosis seen at the mid basilar artery. Right PICA, bilateral superior cerebellar, AICA and posterior cerebral arteries show moderate atheromatous disease. No significant stenosis.  IMPRESSION: 1. Right vertebral and cerebral angiogram demonstrated about 90% stenosis at the vertebrobasilar junction. Successful angioplasty and stent placement without significant residual stenosis. 2.  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 3. 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 4. Mild atheromatous disease in the right intracranial ICA with 20-30% stenosis at the supraclinoid ICA and 30% stenosis in the right M1 segment. There is about 70% stenoses of the right pericallosal branch of the ACA seen in the A3 segment. 5.  Bilateral P comms are patent with the left PCOM being prominent. PLAN: 1.  Blood pressure goals  between 120-140 mm Hg. 2.  Patient to be on dual antiplatelet agents for 6 months. Electronically Signed   By: Frazier Richards M.D.   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 He presented with progressively worsening weakness in the right lower extremity, left lower extremity, right upper extremity, and left upper extremity. On the morning of the procedure, patient was flaccid at the right-sided extremities and had severe weakness in the left-sided extremities (grade 1/5). 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 There was occlusion of the left vertebral artery seen distal to the origin of the PICA. 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 EXAM: 1.  Biplane right carotid and cerebral DSA angiogram. 2.  Biplane left common carotid and cerebral DSA cerebral angiogram 3. Indirect left vertebral and cerebral angiogram with tip of the catheter at the origin of the left vertebral artery 4.  Right subclavian arteriogram. 5. Selective catheterization of the right vertebral artery, biplane vertebral and cerebral DSA cerebral angiogram. 6. Selective catheterization of the right vertebral artery by using the 088 neuron max guide catheter, 5 French AXS catalyst catheter as an intermediate catheter and 021 phenom microcatheter with a 014 Arostotle microwire. 7. High magnification biplane cerebral angiogram and exchange of the microcatheter over an exchange length 014 zoom wire into an Apex 2.25 x 15 mm  monorail balloon catheter and angioplasty of the severely 90% stenotic right vertebrobasilar junction. 8. Resolute onyx 3 mm x 22 mm stent placement at the site of the stenosis. 9. Post stent biplane DSA cerebral angiography of the right vertebral artery injection. 10. Right common femoral arteriogram in the RAO projection after withdrawing the sheath with its tip in the external iliac artery and arteriogram Attending: Dr. Frazier Richards Assistant: Dr. Luanne Bras COMPARISON:  CORRELATION IS MADE WITH CTA OF THE HEAD AND NECK DATED August 03, 2021 MEDICATIONS: The antibiotic was administered within 1 hour of the procedure Injection Ancef 2 g Injection Integrilin 4.5 mg at 1.5 mg increments intra-arterially through the right vertebral artery Heparin 3500 units ANESTHESIA/SEDATION: Anesthesia was provided by the anesthesia team. Please refer to the anesthesia notes for detailed information. CONTRAST:  126 mL of Omnipaque 300 FLUOROSCOPY: Fluoroscopy Time: 48 minutes 48 seconds (3499 mGy). COMPLICATIONS: None immediate. TECHNIQUE: Informed written consent was obtained from the patient and his daughter after a thorough discussion of the procedural risks including but not limited to approximately  5% bleeding, hematoma, vascular injury, worsening of the stroke, ventilator dependency etc. Benefits and alternatives. All questions were addressed. Maximal Sterile Barrier Technique was utilized including caps, mask, sterile gowns, sterile gloves, sterile drape, hand hygiene and skin antiseptic. A timeout was performed prior to the initiation of the procedure. PROCEDURE: The right groin was prepped and draped in the usual sterile fashion. Thereafter using modified Seldinger technique, transfemoral access into the right common femoral artery was obtained without difficulty. Over a 0.035 inch guidewire, an 8 French x 25 cm pinnacle sheath was inserted. Through this, and also over 0.035 inch roadrunner wire, a 5 [place] JB 1  catheter was advanced to the aortic arch region and selectively positioned in the right common carotid artery, and biplane DSA carotid and cerebral angiogram was obtained. Next, with the catheter tip in the left common carotid artery, biplane DSA carotid and cerebral Angiography of the head was obtained. The, selective catheterization of the left subclavian artery followed by angiogram was performed. With the tip of the catheter at the origin of the left vertebral artery, indirect left vertebral and biplane DSA cerebral angiography of left subclavian arterial injection was performed. Next, selective catheterization of the right subclavian artery was performed and the catheter was advanced into the proximal subclavian artery. Next, the wire was exchanged for an exchange length 035 Rosen wire. The JB 1 catheter was removed and 088 neuron max guide catheter and 5 French AXS catalyst catheter were advanced and selective catheterization of the left vertebral artery followed by vertebral and cerebral angiogram was performed. Next, under high magnification angiography, and under roadmap guidance, phenom 21 microcatheter and 0 1 for restarting microwire were advanced into the left posterior cerebral artery and angiogram was performed. Next, the wire was exchanged for a 014 zoom exchange length wire. Subsequently, we selected 2.25 x 15 mm Apex monorail microcatheter and under roadmap guidance, angioplasty of the severely stenotic right vertebrobasilar artery was performed. Post angioplasty angiogram was performed. Next, the balloon was removed. Measurements were made. Next we selected 3 mm x 22 mm resolute onyx stent and the stent was deployed at the right vertebrobasilar stenotic site. The stent was gently dilated and was inflated up to 10 atmospheric pressure. Next, post stent biplane DSA cerebral angiogram was performed. At this point in time, we felt the goal of the procedure was obtained. The catheter was removed.  Femoral access site was further sterilely cleaned and the femoral sheath was withdrawn with its tip in the right external iliac artery, femoral angiogram was performed in the RAO projection and the femoral access site was closed by using an 8 [place] Angio-Seal. Patient tolerated the procedure well and was transferred to the ICU intubated in stable condition. No immediate complications. FINDINGS: Right CCA: The right common carotid arteriogram demonstrates the right external carotid artery and its major branches to be widely patent. Right ICA: The right internal carotid artery at the bulb to the cranial skull base opacifies normally. No significant stenosis. Right Intracranial: The petrous and cavernous segments are widely patent. Mild 20-30% stenosis at the supraclinoid ICA. The right middle cerebral artery and the right anterior cerebral artery opacify normally into the capillary and venous phases. Mild-to-moderate atheromatous disease with about 30% stenosis in the right M1 segment. There is severe about 70% stenosis of the right pericallosal branch of the ACA seen in the proximal aspect. There is cross filling via the anterior communicating artery seen with opacification of the left anterior cerebral artery. PCOM is  patent with faint opacification of the right posterior cerebral artery. Right Sided Anterior Venous: The venous phase demonstrates preferential egress of contrast into the right transverse sinus, the sigmoid sinus and the right internal jugular vein. There is no early venous shunting into the venous structures at the skull base or intracranially into the dural sinuses. Left CCA: The left common carotid arteriogram demonstrates the left external carotid artery and its major branches to be widely patent. Left ICA: The left internal carotid artery at the bulb to the cranial skull base opacifies normally. Mild atheromatous disease at the origin of the ICA. There is mild stenosis seen about 2 cm from the  origin of the left ICA measuring 42 percent by the NASCET criteria. Left Intracranial: The petrous, cavernous and supraclinoid segments are widely patent. The left middle cerebral artery and the left anterior cerebral artery opacify normally into the capillary and venous phases. There is a prominent PCOM opacifying the bilateral posterior cerebral arteries via the distal basilar artery. Left Sided Anterior Venous: The venous phase demonstrates preferential egress of contrast into the right transverse sinus, the sigmoid sinus and the right internal jugular vein. There is opacification of the left transverse sigmoid sinuses and internal jugular veins seen as well. There is no early venous shunting into the venous structures at the skull base or intracranially into the dural sinuses. Indirect left vertebral and cerebral angiogram with injection of the left subclavian artery demonstrate faint opacification of the left vertebral artery. 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 High magnification biplane DSA cerebral angiography of right vertebral artery injection demonstrate severe about 90% stenosis at the right vertebrobasilar junction in length of about 7 mm. Post angioplasty angiogram demonstrate significant residual stenoses. Post stent angiogram demonstrate complete expansion of the stent with no residual stenosis. There is about 40-50% stenosis seen at the mid basilar artery. Right PICA, bilateral superior cerebellar, AICA and posterior cerebral arteries show moderate atheromatous disease. No significant stenosis. IMPRESSION: 1. Right vertebral and cerebral angiogram demonstrated about 90% stenosis at the vertebrobasilar junction. Successful angioplasty and stent placement without significant residual stenosis. 2.  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 3. 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 4. Mild atheromatous disease in the right  intracranial ICA with 20-30% stenosis at the supraclinoid ICA and 30% stenosis in the right M1 segment. There is about 70% stenoses of the right pericallosal branch of the ACA seen in the A3 segment. 5.  Bilateral P comms are patent with the left PCOM being prominent. PLAN: 1.  Blood pressure goals between 120-140 mm Hg. 2.  Patient to be on dual antiplatelet agents for 6 months. Electronically Signed   By: Frazier Richards M.D.   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 He presented with progressively worsening weakness in the right lower extremity, left lower extremity, right upper extremity, and left upper extremity. On the morning of the procedure, patient was flaccid at the right-sided extremities and had severe weakness in the left-sided extremities (grade 1/5). 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  There was occlusion of the left vertebral artery seen distal to the origin of the PICA. 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 EXAM: 1.  Biplane right carotid and cerebral DSA angiogram. 2.  Biplane left common carotid and cerebral DSA cerebral angiogram 3. Indirect left vertebral and cerebral angiogram with tip of the catheter at the origin of the left vertebral artery 4.  Right subclavian arteriogram. 5. Selective catheterization of the right vertebral artery, biplane vertebral and cerebral DSA cerebral angiogram. 6. Selective catheterization of the right vertebral artery by using the 088 neuron max guide catheter, 5 French AXS  catalyst catheter as an intermediate catheter and 021 phenom microcatheter with a 014 Arostotle microwire. 7. High magnification biplane cerebral angiogram and exchange of the microcatheter over an exchange length 014 zoom wire into an Apex 2.25 x 15 mm monorail balloon catheter and angioplasty of the severely 90% stenotic right vertebrobasilar junction. 8. Resolute onyx 3 mm x 22 mm stent placement at the site of the stenosis. 9. Post stent biplane DSA cerebral angiography of the right vertebral artery injection. 10. Right common femoral arteriogram in the RAO projection after withdrawing the sheath with its tip in the external iliac artery and arteriogram Attending: Dr. Frazier Richards Assistant: Dr. Luanne Bras COMPARISON:  CORRELATION IS MADE WITH CTA OF THE HEAD AND NECK DATED August 03, 2021 MEDICATIONS: The antibiotic was administered within 1 hour of the procedure Injection Ancef 2 g Injection Integrilin 4.5 mg at 1.5 mg increments intra-arterially through the right vertebral artery Heparin 3500 units ANESTHESIA/SEDATION: Anesthesia was provided by the anesthesia team. Please refer to the anesthesia notes for detailed information. CONTRAST:  126 mL of Omnipaque 300 FLUOROSCOPY: Fluoroscopy Time: 48 minutes 48 seconds (3499 mGy). COMPLICATIONS: None immediate. TECHNIQUE: Informed written consent was obtained from the patient and his daughter after a thorough discussion of the procedural risks including but not limited to approximately 5% bleeding, hematoma, vascular injury, worsening of the stroke, ventilator dependency etc. Benefits and alternatives. All questions were addressed. Maximal Sterile Barrier Technique was utilized including caps, mask, sterile gowns, sterile gloves, sterile drape, hand hygiene and skin antiseptic. A timeout was performed prior to the initiation of the procedure. PROCEDURE: The right groin was prepped and draped in the usual sterile fashion. Thereafter using modified Seldinger  technique, transfemoral access into the right common femoral artery was obtained without difficulty. Over a 0.035 inch guidewire, an 8 French x 25 cm pinnacle sheath was inserted. Through this, and also over 0.035 inch roadrunner wire, a 5 [place] JB 1 catheter was advanced to the aortic arch region and selectively positioned in the right common carotid artery, and biplane DSA carotid and cerebral angiogram was obtained. Next, with the catheter tip in the left common carotid artery, biplane DSA carotid and cerebral Angiography of the head was obtained. The, selective catheterization of the left subclavian artery followed by angiogram was performed. With the tip of the catheter at the origin of the left vertebral artery, indirect left vertebral and biplane DSA cerebral angiography of left subclavian arterial injection was performed. Next, selective catheterization of the right subclavian artery was performed and the catheter was advanced into the proximal subclavian artery. Next, the wire was exchanged for an exchange length 035 Rosen wire. The JB 1 catheter was removed and 088 neuron max guide catheter and 5 French AXS catalyst catheter were advanced and selective catheterization of the left vertebral artery followed by vertebral and  cerebral angiogram was performed. Next, under high magnification angiography, and under roadmap guidance, phenom 21 microcatheter and 0 1 for restarting microwire were advanced into the left posterior cerebral artery and angiogram was performed. Next, the wire was exchanged for a 014 zoom exchange length wire. Subsequently, we selected 2.25 x 15 mm Apex monorail microcatheter and under roadmap guidance, angioplasty of the severely stenotic right vertebrobasilar artery was performed. Post angioplasty angiogram was performed. Next, the balloon was removed. Measurements were made. Next we selected 3 mm x 22 mm resolute onyx stent and the stent was deployed at the right vertebrobasilar  stenotic site. The stent was gently dilated and was inflated up to 10 atmospheric pressure. Next, post stent biplane DSA cerebral angiogram was performed. At this point in time, we felt the goal of the procedure was obtained. The catheter was removed. Femoral access site was further sterilely cleaned and the femoral sheath was withdrawn with its tip in the right external iliac artery, femoral angiogram was performed in the RAO projection and the femoral access site was closed by using an 8 [place] Angio-Seal. Patient tolerated the procedure well and was transferred to the ICU intubated in stable condition. No immediate complications. FINDINGS: Right CCA: The right common carotid arteriogram demonstrates the right external carotid artery and its major branches to be widely patent. Right ICA: The right internal carotid artery at the bulb to the cranial skull base opacifies normally. No significant stenosis. Right Intracranial: The petrous and cavernous segments are widely patent. Mild 20-30% stenosis at the supraclinoid ICA. The right middle cerebral artery and the right anterior cerebral artery opacify normally into the capillary and venous phases. Mild-to-moderate atheromatous disease with about 30% stenosis in the right M1 segment. There is severe about 70% stenosis of the right pericallosal branch of the ACA seen in the proximal aspect. There is cross filling via the anterior communicating artery seen with opacification of the left anterior cerebral artery. PCOM is patent with faint opacification of the right posterior cerebral artery. Right Sided Anterior Venous: The venous phase demonstrates preferential egress of contrast into the right transverse sinus, the sigmoid sinus and the right internal jugular vein. There is no early venous shunting into the venous structures at the skull base or intracranially into the dural sinuses. Left CCA: The left common carotid arteriogram demonstrates the left external carotid  artery and its major branches to be widely patent. Left ICA: The left internal carotid artery at the bulb to the cranial skull base opacifies normally. Mild atheromatous disease at the origin of the ICA. There is mild stenosis seen about 2 cm from the origin of the left ICA measuring 42 percent by the NASCET criteria. Left Intracranial: The petrous, cavernous and supraclinoid segments are widely patent. The left middle cerebral artery and the left anterior cerebral artery opacify normally into the capillary and venous phases. There is a prominent PCOM opacifying the bilateral posterior cerebral arteries via the distal basilar artery. Left Sided Anterior Venous: The venous phase demonstrates preferential egress of contrast into the right transverse sinus, the sigmoid sinus and the right internal jugular vein. There is opacification of the left transverse sigmoid sinuses and internal jugular veins seen as well. There is no early venous shunting into the venous structures at the skull base or intracranially into the dural sinuses. Indirect left vertebral and cerebral angiogram with injection of the left subclavian artery demonstrate faint opacification of the left vertebral artery. Left vertebral artery occludes immediately distal to the origin  of the PICA. High magnification biplane DSA cerebral angiography of right vertebral artery injection demonstrate severe about 90% stenosis at the right vertebrobasilar junction in length of about 7 mm. Post angioplasty angiogram demonstrate significant residual stenoses. Post stent angiogram demonstrate complete expansion of the stent with no residual stenosis. There is about 40-50% stenosis seen at the mid basilar artery. Right PICA, bilateral superior cerebellar, AICA and posterior cerebral arteries show moderate atheromatous disease. No significant stenosis. IMPRESSION: 1. Right vertebral and cerebral angiogram demonstrated about 90% stenosis at the vertebrobasilar junction.  Successful angioplasty and stent placement without significant residual stenosis. 2.  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 3. 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 4. Mild atheromatous disease in the right intracranial ICA with 20-30% stenosis at the supraclinoid ICA and 30% stenosis in the right M1 segment. There is about 70% stenoses of the right pericallosal branch of the ACA seen in the A3 segment. 5.  Bilateral P comms are patent with the left PCOM being prominent. PLAN: 1.  Blood pressure goals between 120-140 mm Hg. 2.  Patient to be on dual antiplatelet agents for 6 months. Electronically Signed   By: Frazier Richards M.D.   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 He presented with progressively worsening weakness in the right lower extremity, left lower extremity, right upper extremity, and left upper extremity. On the morning of the procedure, patient was flaccid at the right-sided extremities and had severe weakness in the left-sided extremities (grade 1/5). 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 There was occlusion of the left vertebral artery seen distal to the origin of the PICA. 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 EXAM: 1.  Biplane right carotid and cerebral DSA angiogram. 2.  Biplane left common carotid and cerebral DSA cerebral angiogram 3. Indirect left vertebral and cerebral angiogram with tip of the catheter at the origin of the left vertebral artery  4.  Right subclavian arteriogram. 5. Selective catheterization of the right vertebral artery, biplane vertebral and cerebral DSA cerebral angiogram. 6. Selective catheterization of the right vertebral artery by using the 088 neuron max guide catheter, 5 French AXS catalyst catheter as an intermediate catheter and 021 phenom microcatheter with a 014 Arostotle microwire. 7. High magnification biplane cerebral angiogram and exchange of the microcatheter over an exchange length 014 zoom wire into an Apex 2.25 x 15 mm monorail balloon catheter and angioplasty of the severely 90% stenotic right vertebrobasilar junction. 8. Resolute onyx 3 mm x 22 mm stent placement at the site of the stenosis. 9. Post stent biplane DSA cerebral angiography of the right vertebral artery injection. 10. Right common femoral arteriogram in the RAO projection after withdrawing the sheath with its tip in the external iliac artery and arteriogram Attending: Dr. Frazier Richards Assistant: Dr. Luanne Bras COMPARISON:  CORRELATION IS MADE WITH CTA OF THE HEAD AND NECK DATED August 03, 2021 MEDICATIONS: The antibiotic was administered within 1 hour of the procedure Injection Ancef 2 g Injection Integrilin 4.5 mg at  1.5 mg increments intra-arterially through the right vertebral artery Heparin 3500 units ANESTHESIA/SEDATION: Anesthesia was provided by the anesthesia team. Please refer to the anesthesia notes for detailed information. CONTRAST:  126 mL of Omnipaque 300 FLUOROSCOPY: Fluoroscopy Time: 48 minutes 48 seconds (3499 mGy). COMPLICATIONS: None immediate. TECHNIQUE: Informed written consent was obtained from the patient and his daughter after a thorough discussion of the procedural risks including but not limited to approximately 5% bleeding, hematoma, vascular injury, worsening of the stroke, ventilator dependency etc. Benefits and alternatives. All questions were addressed. Maximal Sterile Barrier Technique was utilized including caps, mask,  sterile gowns, sterile gloves, sterile drape, hand hygiene and skin antiseptic. A timeout was performed prior to the initiation of the procedure. PROCEDURE: The right groin was prepped and draped in the usual sterile fashion. Thereafter using modified Seldinger technique, transfemoral access into the right common femoral artery was obtained without difficulty. Over a 0.035 inch guidewire, an 8 French x 25 cm pinnacle sheath was inserted. Through this, and also over 0.035 inch roadrunner wire, a 5 [place] JB 1 catheter was advanced to the aortic arch region and selectively positioned in the right common carotid artery, and biplane DSA carotid and cerebral angiogram was obtained. Next, with the catheter tip in the left common carotid artery, biplane DSA carotid and cerebral Angiography of the head was obtained. The, selective catheterization of the left subclavian artery followed by angiogram was performed. With the tip of the catheter at the origin of the left vertebral artery, indirect left vertebral and biplane DSA cerebral angiography of left subclavian arterial injection was performed. Next, selective catheterization of the right subclavian artery was performed and the catheter was advanced into the proximal subclavian artery. Next, the wire was exchanged for an exchange length 035 Rosen wire. The JB 1 catheter was removed and 088 neuron max guide catheter and 5 French AXS catalyst catheter were advanced and selective catheterization of the left vertebral artery followed by vertebral and cerebral angiogram was performed. Next, under high magnification angiography, and under roadmap guidance, phenom 21 microcatheter and 0 1 for restarting microwire were advanced into the left posterior cerebral artery and angiogram was performed. Next, the wire was exchanged for a 014 zoom exchange length wire. Subsequently, we selected 2.25 x 15 mm Apex monorail microcatheter and under roadmap guidance, angioplasty of the severely  stenotic right vertebrobasilar artery was performed. Post angioplasty angiogram was performed. Next, the balloon was removed. Measurements were made. Next we selected 3 mm x 22 mm resolute onyx stent and the stent was deployed at the right vertebrobasilar stenotic site. The stent was gently dilated and was inflated up to 10 atmospheric pressure. Next, post stent biplane DSA cerebral angiogram was performed. At this point in time, we felt the goal of the procedure was obtained. The catheter was removed. Femoral access site was further sterilely cleaned and the femoral sheath was withdrawn with its tip in the right external iliac artery, femoral angiogram was performed in the RAO projection and the femoral access site was closed by using an 8 [place] Angio-Seal. Patient tolerated the procedure well and was transferred to the ICU intubated in stable condition. No immediate complications. FINDINGS: Right CCA: The right common carotid arteriogram demonstrates the right external carotid artery and its major branches to be widely patent. Right ICA: The right internal carotid artery at the bulb to the cranial skull base opacifies normally. No significant stenosis. Right Intracranial: The petrous and cavernous segments are widely patent. Mild 20-30% stenosis  at the supraclinoid ICA. The right middle cerebral artery and the right anterior cerebral artery opacify normally into the capillary and venous phases. Mild-to-moderate atheromatous disease with about 30% stenosis in the right M1 segment. There is severe about 70% stenosis of the right pericallosal branch of the ACA seen in the proximal aspect. There is cross filling via the anterior communicating artery seen with opacification of the left anterior cerebral artery. PCOM is patent with faint opacification of the right posterior cerebral artery. Right Sided Anterior Venous: The venous phase demonstrates preferential egress of contrast into the right transverse sinus, the  sigmoid sinus and the right internal jugular vein. There is no early venous shunting into the venous structures at the skull base or intracranially into the dural sinuses. Left CCA: The left common carotid arteriogram demonstrates the left external carotid artery and its major branches to be widely patent. Left ICA: The left internal carotid artery at the bulb to the cranial skull base opacifies normally. Mild atheromatous disease at the origin of the ICA. There is mild stenosis seen about 2 cm from the origin of the left ICA measuring 42 percent by the NASCET criteria. Left Intracranial: The petrous, cavernous and supraclinoid segments are widely patent. The left middle cerebral artery and the left anterior cerebral artery opacify normally into the capillary and venous phases. There is a prominent PCOM opacifying the bilateral posterior cerebral arteries via the distal basilar artery. Left Sided Anterior Venous: The venous phase demonstrates preferential egress of contrast into the right transverse sinus, the sigmoid sinus and the right internal jugular vein. There is opacification of the left transverse sigmoid sinuses and internal jugular veins seen as well. There is no early venous shunting into the venous structures at the skull base or intracranially into the dural sinuses. Indirect left vertebral and cerebral angiogram with injection of the left subclavian artery demonstrate faint opacification of the left vertebral artery. 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 High magnification biplane DSA cerebral angiography of right vertebral artery injection demonstrate severe about 90% stenosis at the right vertebrobasilar junction in length of about 7 mm. Post angioplasty angiogram demonstrate significant residual stenoses. Post stent angiogram demonstrate complete expansion of the stent with no residual stenosis. There is about 40-50% stenosis seen at the mid basilar artery.  Right PICA, bilateral superior cerebellar, AICA and posterior cerebral arteries show moderate atheromatous disease. No significant stenosis. IMPRESSION: 1. Right vertebral and cerebral angiogram demonstrated about 90% stenosis at the vertebrobasilar junction. Successful angioplasty and stent placement without significant residual stenosis. 2.  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 3. 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 4. Mild atheromatous disease in the right intracranial ICA with 20-30% stenosis at the supraclinoid ICA and 30% stenosis in the right M1 segment. There is about 70% stenoses of the right pericallosal branch of the ACA seen in the A3 segment. 5.  Bilateral P comms are patent with the left PCOM being prominent. PLAN: 1.  Blood pressure goals between 120-140 mm Hg. 2.  Patient to be on dual antiplatelet agents for 6 months. Electronically Signed   By: Frazier Richards M.D.   On: 08/09/2021 10:26   DG CHEST PORT 1 VIEW  Result Date: 08/09/2021 CLINICAL DATA:  Tracheostomy dependent. EXAM: PORTABLE CHEST 1 VIEW COMPARISON:  08/06/2021 FINDINGS: The tracheostomy tube tip is above the carina. Heart size and mediastinal contours are stable. Mild left lung base  atelectasis with volume loss is stable from the previous exam. Right lung is clear. IMPRESSION: Stable exam. Electronically Signed   By: Kerby Moors M.D.   On: 08/09/2021 08:47   DG Chest Port 1 View  Result Date: 08/06/2021 CLINICAL DATA:  Status post tracheostomy EXAM: PORTABLE CHEST 1 VIEW COMPARISON:  None Available. FINDINGS: Tracheostomy tube with tip in the proximal trachea in appropriate position. New mild LEFT basilar atelectasis. Mild central venous congestion. IMPRESSION: Tracheostomy tube in good position. New LEFT basilar atelectasis. Electronically Signed   By: Suzy Bouchard M.D.   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 He presented with progressively worsening weakness in the right lower extremity, left lower extremity, right upper extremity, and left upper extremity. On the morning of the procedure, patient was flaccid at the right-sided extremities and had severe weakness in the left-sided extremities (grade 1/5). 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 There was occlusion of the left vertebral artery seen distal to the origin of the PICA. 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 EXAM: 1.  Biplane right carotid and cerebral DSA angiogram. 2.  Biplane left common carotid and cerebral DSA cerebral angiogram 3. Indirect left vertebral and cerebral angiogram with tip of the catheter at the origin of the left vertebral artery 4.  Right subclavian arteriogram. 5. Selective catheterization of the right vertebral artery, biplane vertebral and cerebral DSA cerebral angiogram. 6. Selective catheterization of the right vertebral artery by using the 088 neuron max guide catheter, 5 French AXS catalyst catheter as an intermediate catheter and 021 phenom microcatheter with a 014 [place] microwire. 7. High magnification biplane cerebral angiogram and exchange of the microcatheter over an exchange length 014 zoom wire into an Apex 2.25 x 15 mm monorail balloon catheter and angioplasty of the severely 90% stenotic right vertebrobasilar junction. 8. Resolute onyx 3 mm x 22 mm stent placement at the site of the stenosis. 9. Post stent biplane DSA cerebral angiography of the right vertebral artery  injection. 10. Right common femoral arteriogram in the RAO projection after withdrawing the sheath with its tip in the external iliac artery and arteriogram Attending: Dr. Frazier Richards Assistant: Dr. Luanne Bras COMPARISON:  CORRELATION IS MADE WITH CTA OF THE HEAD AND NECK DATED August 03, 2021 MEDICATIONS: The antibiotic was administered within 1 hour of the procedure Injection Ancef 2 g Injection Integrilin 4.5 mg at 1.5 mg increments intra-arterially through the right vertebral artery Heparin 3500 units ANESTHESIA/SEDATION: Anesthesia was provided by the anesthesia team. Please refer to the anesthesia notes for detailed information. CONTRAST:  126 mL of Omnipaque 300 FLUOROSCOPY: Fluoroscopy Time: 48 minutes 48 seconds (3499 mGy). COMPLICATIONS: None immediate. TECHNIQUE: Informed written consent was obtained from the patient and his daughter after a thorough discussion of the procedural risks including but not limited to approximately 5% bleeding, hematoma, vascular injury, worsening of the stroke, ventilator dependency etc. Benefits and alternatives. All questions were addressed. Maximal Sterile Barrier Technique was utilized including caps, mask, sterile gowns, sterile gloves, sterile drape, hand hygiene and skin antiseptic. A timeout was performed  prior to the initiation of the procedure. PROCEDURE: The right groin was prepped and draped in the usual sterile fashion. Thereafter using modified Seldinger technique, transfemoral access into the right common femoral artery was obtained without difficulty. Over a 0.035 inch guidewire, an 8 French x 25 cm pinnacle sheath was inserted. Through this, and also over 0.035 inch roadrunner wire, a 5 [place] JB 1 catheter was advanced to the aortic arch region and selectively positioned in the right common carotid artery, and biplane DSA carotid and cerebral angiogram was obtained. Next, with the catheter tip in the left common carotid artery, biplane DSA carotid and  cerebral Angiography of the head was obtained. The, selective catheterization of the left subclavian artery followed by angiogram was performed. With the tip of the catheter at the origin of the left vertebral artery, indirect left vertebral and biplane DSA cerebral angiography of left subclavian arterial injection was performed. Next, selective catheterization of the right subclavian artery was performed and the catheter was advanced into the proximal subclavian artery. Next, the wire was exchanged for an exchange length 035 Rosen wire. The JB 1 catheter was removed and 088 neuron max guide catheter and 5 French AXS catalyst catheter were advanced and selective catheterization of the left vertebral artery followed by vertebral and cerebral angiogram was performed. Next, under high magnification angiography, and under roadmap guidance, phenom 21 microcatheter and 0 1 for restarting microwire were advanced into the left posterior cerebral artery and angiogram was performed. Next, the wire was exchanged for a 014 zoom exchange length wire. Subsequently, we selected 2.25 x 15 mm Apex monorail microcatheter and under roadmap guidance, angioplasty of the severely stenotic right vertebrobasilar artery was performed. Post angioplasty angiogram was performed. Next, the balloon was removed. Measurements were made. Next we selected 3 mm x 22 mm resolute onyx stent and the stent was deployed at the right vertebrobasilar stenotic site. The stent was gently dilated and was inflated up to 10 atmospheric pressure. Next, post stent biplane DSA cerebral angiogram was performed. At this point in time, we felt the goal of the procedure was obtained. The catheter was removed. Femoral access site was further sterilely cleaned and the femoral sheath was withdrawn with its tip in the right external iliac artery, femoral angiogram was performed in the RAO projection and the femoral access site was closed by using an 8 [place] Angio-Seal.  Patient tolerated the procedure well and was transferred to the ICU intubated in stable condition. No immediate complications. FINDINGS: Right CCA: The right common carotid arteriogram demonstrates the right external carotid artery and its major branches to be widely patent. Right ICA: The right internal carotid artery at the bulb to the cranial skull base opacifies normally. No significant stenosis. Right Intracranial: The petrous and cavernous segments are widely patent. Mild 20-30% stenosis at the supraclinoid ICA. The right middle cerebral artery and the right anterior cerebral artery opacify normally into the capillary and venous phases. Mild-to-moderate atheromatous disease with about 30% stenosis in the right M1 segment. There is severe about 70% stenosis of the right pericallosal branch of the ACA seen in the proximal aspect. There is cross filling via the anterior communicating artery seen with opacification of the left anterior cerebral artery. PCOM is patent with faint opacification of the right posterior cerebral artery. Right Sided Anterior Venous: The venous phase demonstrates preferential egress of contrast into the right transverse sinus, the sigmoid sinus and the right internal jugular vein. There is no early venous shunting into  the venous structures at the skull base or intracranially into the dural sinuses. Left CCA: The left common carotid arteriogram demonstrates the left external carotid artery and its major branches to be widely patent. Left ICA: The left internal carotid artery at the bulb to the cranial skull base opacifies normally. Mild atheromatous disease at the origin of the ICA. There is mild stenosis seen about 2 cm from the origin of the left ICA measuring 42 percent by the NASCET criteria. Left Intracranial: The petrous, cavernous and supraclinoid segments are widely patent. The left middle cerebral artery and the left anterior cerebral artery opacify normally into the capillary  and venous phases. There is a prominent PCOM opacifying the bilateral posterior cerebral arteries via the distal basilar artery. Left Sided Anterior Venous: The venous phase demonstrates preferential egress of contrast into the right transverse sinus, the sigmoid sinus and the right internal jugular vein. There is opacification of the left transverse sigmoid sinuses and internal jugular veins seen as well. There is no early venous shunting into the venous structures at the skull base or intracranially into the dural sinuses. Indirect left vertebral and cerebral angiogram with injection of the left subclavian artery demonstrate faint opacification of the left vertebral artery. 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 High magnification biplane DSA cerebral angiography of right vertebral artery injection demonstrate severe about 90% stenosis at the right vertebrobasilar junction in length of about 7 mm. Post angioplasty angiogram demonstrate significant residual stenoses. Post stent angiogram demonstrate complete expansion of the stent with no residual stenosis. There is about 40-50% stenosis seen at the mid basilar artery. Right PICA, bilateral superior cerebellar, AICA and posterior cerebral arteries show moderate atheromatous disease. No significant stenosis. IMPRESSION: 1. Right vertebral and cerebral angiogram demonstrated about 90% stenosis at the vertebrobasilar junction. Successful angioplasty and stent placement without significant residual stenosis. 2.  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 3. 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 4. Mild atheromatous disease in the right intracranial ICA with 20-30% stenosis at the supraclinoid ICA and 30% stenosis in the right M1 segment. There is about 70% stenoses of the right pericallosal branch of the ACA seen in the A3 segment. 5.  Bilateral P comms are patent with the left PCOM being  prominent. PLAN: 1.  Blood pressure goals between 120-140 mm Hg. 2.  Patient to be on dual antiplatelet agents for 6 months. Electronically Signed   By: Frazier Richards M.D.   On: 08/05/2021 14:38   MR BRAIN WO CONTRAST  Result Date: 08/05/2021 CLINICAL DATA:  65 year old male with history of known medullary infarct and severe vertebrobasilar disease, status post catheter directed revascularization and stenting. EXAM: MRI HEAD WITHOUT CONTRAST MRA HEAD WITHOUT CONTRAST TECHNIQUE: Multiplanar, multi-echo pulse sequences of the brain and surrounding structures were acquired without intravenous contrast. Angiographic images of the Circle of Willis were acquired using MRA technique without intravenous contrast. COMPARISON:  Comparison made with arteriogram from earlier the same day as well as multiple previous studies. FINDINGS: MRI HEAD FINDINGS Brain: There has been interval expansion of previously identified ventral medullary infarct, now extending posteriorly to traversed the entirety of the medulla to the floor of the fourth ventricle (series 5, images 63, 64, 65), slightly worse on the left. Additionally, there are new scattered patchy ischemic infarcts involving the right cerebellum. Few additional patchy small volume ischemic infarcts noted involving the cortical aspects of the right greater  than left occipital lobes (series 5, images 79, 77, 75, 73, 72 no significant associated mass effect. Chronic right cerebellar microhemorrhage noted. There is a new small focus of susceptibility artifact within the adjacent right cerebellum, likely a punctate focus of petechial blood products (series 14, image 20). No other associated blood products. Remainder the brain is otherwise stable in appearance. Stable cerebral volume with chronic small vessel ischemic disease. No new focal parenchymal signal abnormality. Pituitary gland and suprasellar region remain within normal limits. Midline encephalomalacia with chronic  hemosiderin staining at the anterior right frontal lobe again noted. Remote lacunar infarcts again noted about the cerebellum, pons, thalami, and left posterior corona radiata. No mass lesion, significant mass effect, or midline shift. Stable ventricular size without hydrocephalus. No extra-axial fluid collection. Vascular: Susceptibility artifact now seen about the vertebrobasilar junction related to interval stenting. Major intracranial vascular flow voids are maintained. Skull and upper cervical spine: Craniocervical junction within normal limits. Bone marrow signal intensity normal. No new scalp soft tissue abnormality. Sinuses/Orbits: Globes and orbital soft tissues within normal limits. Moderate mucosal thickening throughout the paranasal sinuses with fluid within the nasopharynx. Patient is intubated. Mastoid air cells remain largely clear. Other: None. MRA HEAD FINDINGS Anterior circulation: Visualized distal cervical segments of the internal carotid arteries remain patent with antegrade flow. Petrous segments remain widely patent. Atheromatous irregularity about both carotid siphons with associated moderate stenosis at the supraclinoid left ICA. A1 segments patent. Normal anterior communicating artery complex. Left ACA remains patent. Distal severe right A2/A3 stenosis noted (series 1, image 164), stable. Left M1 segment remains patent. Mild to moderate long segment right M1 stenosis again noted. Negative MCA bifurcations. No proximal MCA branch occlusion. Distal MCA branches remain perfused and symmetric, although demonstrate mild small vessel atheromatous irregularity. Posterior circulation: Heterogeneous atheromatous irregularity again noted about both proximal V4 segments as they course into the cranial vault. Multifocal moderate stenoses involving the proximal left V4 segment again noted. Left V4 remains patent to the takeoff of the left PICA which remains patent and well perfused. Occlusion of the  distal left V4 segment beyond the left PICA again noted, stable. On the right, there has been interval placement of a vascular stent extending from the distal right V4 segment across the vertebrobasilar junction into the proximal basilar artery. This traverses the previously seen high-grade stenosis at this level. Grossly patent flow through the stent, although evaluation for possible intra stent stenosis limited by MRA. Stable and fairly robust flow seen within the basilar artery distally. Suspected short-segment fenestration noted at the distal basilar artery. Superior cerebellar arteries remain patent. Right PCA primarily supplied via the basilar. Left PCA supplied via the basilar as well as a robust left posterior communicating artery. Atheromatous disease involving both PCAs with associated severe bilateral P2 stenoses. PCAs remain patent to their distal aspects although demonstrate small vessel atheromatous irregularity. Anatomic variants: None significant. IMPRESSION: MRI HEAD IMPRESSION: 1. 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 Additionally, there are new scattered small volume ischemic infarcts involving the right cerebellum as well as the cortical aspects of the right greater than left occipital lobes. No associated mass effect. Single punctate focus of associated petechial hemorrhage at the right cerebellum as above. 2. Otherwise stable appearance of the brain with atrophy, chronic microvascular ischemic disease, and multiple chronic ischemic infarcts as above. MRA HEAD IMPRESSION: 1. Interval placement of a vascular stent extending from the  distal right V4 segment across the vertebrobasilar junction into the proximal basilar artery. Grossly patent flow through the stent, although evaluation for possible intra stent stenosis is limited by MRA. Stable and fairly robust flow seen within  the basilar artery distally. 2. Otherwise stable appearance of the intracranial circulation with moderate multifocal stenoses involving the proximal left V4 segment with distal left V4 occlusion, severe right A2/A3 stenosis, severe bilateral P2 stenoses, moderate supraclinoid left ICA stenosis, with mild to moderate long segment right M1 stenosis. Electronically Signed   By: Jeannine Boga M.D.   On: 08/05/2021 02:44   MR ANGIO HEAD WO CONTRAST  Result Date: 08/05/2021 CLINICAL DATA:  66 year old male with history of known medullary infarct and severe vertebrobasilar disease, status post catheter directed revascularization and stenting. EXAM: MRI HEAD WITHOUT CONTRAST MRA HEAD WITHOUT CONTRAST TECHNIQUE: Multiplanar, multi-echo pulse sequences of the brain and surrounding structures were acquired without intravenous contrast. Angiographic images of the Circle of Willis were acquired using MRA technique without intravenous contrast. COMPARISON:  Comparison made with arteriogram from earlier the same day as well as multiple previous studies. FINDINGS: MRI HEAD FINDINGS Brain: There has been interval expansion of previously identified ventral medullary infarct, now extending posteriorly to traversed the entirety of the medulla to the floor of the fourth ventricle (series 5, images 63, 64, 65), slightly worse on the left. Additionally, there are new scattered patchy ischemic infarcts involving the right cerebellum. Few additional patchy small volume ischemic infarcts noted involving the cortical aspects of the right greater than left occipital lobes (series 5, images 79, 77, 75, 73, 72 no significant associated mass effect. Chronic right cerebellar microhemorrhage noted. There is a new small focus of susceptibility artifact within the adjacent right cerebellum, likely a punctate focus of petechial blood products (series 14, image 20). No other associated blood products. Remainder the brain is otherwise stable  in appearance. Stable cerebral volume with chronic small vessel ischemic disease. No new focal parenchymal signal abnormality. Pituitary gland and suprasellar region remain within normal limits. Midline encephalomalacia with chronic hemosiderin staining at the anterior right frontal lobe again noted. Remote lacunar infarcts again noted about the cerebellum, pons, thalami, and left posterior corona radiata. No mass lesion, significant mass effect, or midline shift. Stable ventricular size without hydrocephalus. No extra-axial fluid collection. Vascular: Susceptibility artifact now seen about the vertebrobasilar junction related to interval stenting. Major intracranial vascular flow voids are maintained. Skull and upper cervical spine: Craniocervical junction within normal limits. Bone marrow signal intensity normal. No new scalp soft tissue abnormality. Sinuses/Orbits: Globes and orbital soft tissues within normal limits. Moderate mucosal thickening throughout the paranasal sinuses with fluid within the nasopharynx. Patient is intubated. Mastoid air cells remain largely clear. Other: None. MRA HEAD FINDINGS Anterior circulation: Visualized distal cervical segments of the internal carotid arteries remain patent with antegrade flow. Petrous segments remain widely patent. Atheromatous irregularity about both carotid siphons with associated moderate stenosis at the supraclinoid left ICA. A1 segments patent. Normal anterior communicating artery complex. Left ACA remains patent. Distal severe right A2/A3 stenosis noted (series 1, image 164), stable. Left M1 segment remains patent. Mild to moderate long segment right M1 stenosis again noted. Negative MCA bifurcations. No proximal MCA branch occlusion. Distal MCA branches remain perfused and symmetric, although demonstrate mild small vessel atheromatous irregularity. Posterior circulation: Heterogeneous atheromatous irregularity again noted about both proximal V4 segments as  they course into the cranial vault. Multifocal moderate stenoses involving the proximal left V4 segment again noted. Left  V4 remains patent to the takeoff of the left PICA which remains patent and well perfused. Occlusion of the distal left V4 segment beyond the left PICA again noted, stable. On the right, there has been interval placement of a vascular stent extending from the distal right V4 segment across the vertebrobasilar junction into the proximal basilar artery. This traverses the previously seen high-grade stenosis at this level. Grossly patent flow through the stent, although evaluation for possible intra stent stenosis limited by MRA. Stable and fairly robust flow seen within the basilar artery distally. Suspected short-segment fenestration noted at the distal basilar artery. Superior cerebellar arteries remain patent. Right PCA primarily supplied via the basilar. Left PCA supplied via the basilar as well as a robust left posterior communicating artery. Atheromatous disease involving both PCAs with associated severe bilateral P2 stenoses. PCAs remain patent to their distal aspects although demonstrate small vessel atheromatous irregularity. Anatomic variants: None significant. IMPRESSION: MRI HEAD IMPRESSION: 1. 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 Additionally, there are new scattered small volume ischemic infarcts involving the right cerebellum as well as the cortical aspects of the right greater than left occipital lobes. No associated mass effect. Single punctate focus of associated petechial hemorrhage at the right cerebellum as above. 2. Otherwise stable appearance of the brain with atrophy, chronic microvascular ischemic disease, and multiple chronic ischemic infarcts as above. MRA HEAD IMPRESSION: 1. Interval placement of a vascular stent extending from the distal right V4  segment across the vertebrobasilar junction into the proximal basilar artery. Grossly patent flow through the stent, although evaluation for possible intra stent stenosis is limited by MRA. Stable and fairly robust flow seen within the basilar artery distally. 2. Otherwise stable appearance of the intracranial circulation with moderate multifocal stenoses involving the proximal left V4 segment with distal left V4 occlusion, severe right A2/A3 stenosis, severe bilateral P2 stenoses, moderate supraclinoid left ICA stenosis, with mild to moderate long segment right M1 stenosis. Electronically Signed   By: Jeannine Boga M.D.   On: 08/05/2021 02:44   DG Abd 1 View  Result Date: 08/04/2021 CLINICAL DATA:  NG tube placement EXAM: ABDOMEN - 1 VIEW COMPARISON:  10/11/2012 FINDINGS: Tip of NG tube is seen in the region of the antrum of the stomach. Bowel gas pattern is nonspecific. Pelvis is not included in its entirety. IMPRESSION: Tip of NG tube is seen in the stomach. Electronically Signed   By: Elmer Picker M.D.   On: 08/04/2021 17:32   DG Chest Port 1 View  Result Date: 08/04/2021 CLINICAL DATA:  Intubated EXAM: PORTABLE CHEST 1 VIEW COMPARISON:  08/04/2021 FINDINGS: Endotracheal tube tip is about 4.7 cm superior to the carina. Subsegmental atelectasis at the left base. Cardiomegaly. No consolidation, pleural effusion or pneumothorax IMPRESSION: 1. Endotracheal tube tip about 4.7 cm superior to the carina. 2. Cardiomegaly.  Subsegmental atelectasis at the left base. Electronically Signed   By: Donavan Foil M.D.   On: 08/04/2021 15:10   DG Chest Port 1 View  Result Date: 08/04/2021 CLINICAL DATA:  Endotracheal intubation EXAM: PORTABLE CHEST 1 VIEW COMPARISON:  Earlier same day FINDINGS: Endotracheal tube tip 6 cm above the carina. Cardiomegaly persists. Pulmonary venous hypertension, possibly with early interstitial edema. Right lateral chest not included on the image. No evidence of collapse  or effusion. IMPRESSION: Endotracheal tube tip 6 cm above the carina. Electronically Signed   By: Nelson Chimes  M.D.   On: 08/04/2021 13:42   ECHOCARDIOGRAM COMPLETE  Result Date: 08/04/2021    ECHOCARDIOGRAM REPORT   Patient Name:   JARRYD GRATZ Date of Exam: 08/03/2021 Medical Rec #:  [id number]   Height:       67.0 in Accession #:    [id number]  Weight:       185.0 lb Date of Birth:  1955-01-12   BSA:          1.956 m Patient Age:    54 years    BP:           142/79 mmHg Patient Gender: M           HR:           76 bpm. Exam Location:  [facility] Procedure: 2D Echo, Cardiac Doppler and Color Doppler Indications:     I63.9 Stroke  History:         Patient has no prior history of Echocardiogram examinations.                  Risk Factors:Diabetes, Dyslipidemia, Hypertension and Former                  Smoker.  Sonographer:     Rosalia Hammers Referring Phys:  [id number] Athena Masse Diagnosing Phys: Francis  1. Left ventricular ejection fraction, by estimation, is 60 to 65%. The left ventricle has normal function. The left ventricle has no regional wall motion abnormalities. There is moderate concentric left ventricular hypertrophy. Left ventricular diastolic parameters are consistent with Grade I diastolic dysfunction (impaired relaxation).  2. Right ventricular systolic function is normal. The right ventricular size is normal.  3. The mitral valve is normal in structure. Trivial mitral valve regurgitation. No evidence of mitral stenosis.  4. The aortic valve is normal in structure. Aortic valve regurgitation is mild. No aortic stenosis is present.  5. The inferior vena cava is normal in size with greater than 50% respiratory variability, suggesting right atrial pressure of 3 mmHg. FINDINGS  Left Ventricle: Left ventricular ejection fraction, by estimation, is 60 to 65%. The left ventricle has normal function. The left ventricle has no regional wall motion abnormalities. The left ventricular internal  cavity size was normal in size. There is  moderate concentric left ventricular hypertrophy. Left ventricular diastolic parameters are consistent with Grade I diastolic dysfunction (impaired relaxation). Right Ventricle: The right ventricular size is normal. No increase in right ventricular wall thickness. Right ventricular systolic function is normal. Left Atrium: Left atrial size was normal in size. Right Atrium: Right atrial size was normal in size. Pericardium: There is no evidence of pericardial effusion. Mitral Valve: The mitral valve is normal in structure. Trivial mitral valve regurgitation. No evidence of mitral valve stenosis. Tricuspid Valve: The tricuspid valve is normal in structure. Tricuspid valve regurgitation is trivial. No evidence of tricuspid stenosis. Aortic Valve: The aortic valve is normal in structure. Aortic valve regurgitation is mild. Aortic regurgitation PHT measures 1577 msec. No aortic stenosis is present. Aortic valve mean gradient measures 5.0 mmHg. Aortic valve peak gradient measures 9.1 mmHg. Aortic valve area, by VTI measures 2.30 cm. Pulmonic Valve: The pulmonic valve was normal in structure. Pulmonic valve regurgitation is not visualized. No evidence of pulmonic stenosis. Aorta: The aortic root is normal in size and structure. Venous: The inferior vena cava is normal in size with greater than 50% respiratory variability, suggesting right atrial pressure of 3 mmHg. IAS/Shunts: No atrial level shunt  detected by color flow Doppler.  LEFT VENTRICLE PLAX 2D LVIDd:         3.84 cm   Diastology LVIDs:         2.17 cm   LV e' medial:    6.39 cm/s LV PW:         1.65 cm   LV E/e' medial:  9.6 LV IVS:        1.18 cm   LV e' lateral:   7.83 cm/s LVOT diam:     1.90 cm   LV E/e' lateral: 7.8 LV SV:         74 LV SV Index:   38 LVOT Area:     2.84 cm  RIGHT VENTRICLE RV Basal diam:  3.15 cm RV S prime:     17.40 cm/s TAPSE (M-mode): 2.4 cm LEFT ATRIUM             Index        RIGHT ATRIUM            Index LA diam:        3.20 cm 1.64 cm/m   RA Area:     18.10 cm LA Vol (A2C):   72.4 ml 37.01 ml/m  RA Volume:   47.10 ml  24.08 ml/m LA Vol (A4C):   65.8 ml 33.63 ml/m LA Biplane Vol: 69.3 ml 35.42 ml/m  AORTIC VALVE AV Area (Vmax):    2.10 cm AV Area (Vmean):   2.06 cm AV Area (VTI):     2.30 cm AV Vmax:           151.00 cm/s AV Vmean:          100.000 cm/s AV VTI:            0.323 m AV Peak Grad:      9.1 mmHg AV Mean Grad:      5.0 mmHg LVOT Vmax:         112.00 cm/s LVOT Vmean:        72.600 cm/s LVOT VTI:          0.262 m LVOT/AV VTI ratio: 0.81 AI PHT:            1577 msec  AORTA Ao Root diam: 3.20 cm MITRAL VALVE MV Area (PHT): 3.20 cm     SHUNTS MV Decel Time: 237 msec     Systemic VTI:  0.26 m MV E velocity: 61.30 cm/s   Systemic Diam: 1.90 cm MV A velocity: 101.00 cm/s MV E/A ratio:  0.61 Shaukat Khan Electronically signed by Neoma Laming Signature Date/Time: 08/04/2021/8:55:17 AM    Final    MR BRAIN WO CONTRAST  Result Date: 08/03/2021 CLINICAL DATA:  66 year old male with severe intracranial atherosclerosis and questionable medullary ischemia on brain MRI yesterday. EXAM: MRI HEAD WITHOUT CONTRAST TECHNIQUE: Multiplanar, multiecho pulse sequences of the brain and surrounding structures were obtained without intravenous contrast. COMPARISON:  CTA head and neck this morning.  Brain MRI yesterday. FINDINGS: Brain: More convincing appearance of ventral medullary restricted diffusion, asymmetrically greater to the left of midline. See series 5, image 12, series 7, images 18 and 19. Mild if any associated T2 and FLAIR hyperintensity there. And no other convincing diffusion restriction despite the CTA findings earlier today. 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 Chronic encephalomalacia right inferior frontal gyrus with hemosiderin. Occasional chronic microhemorrhages elsewhere, right cerebellum (series 13, image 19). No new  signal abnormality compared  to yesterday. No midline shift, mass effect, evidence of mass lesion, ventriculomegaly, extra-axial collection or acute intracranial hemorrhage. Cervicomedullary junction and pituitary are within normal limits. Vascular: Major intracranial vascular flow voids are stable from yesterday. See also CTA today reported separately. Skull and upper cervical spine: Negative. Visualized bone marrow signal is within normal limits. Sinuses/Orbits: Stable, negative. Other: Visible internal auditory structures appear normal. Mastoids are clear. Negative visible scalp and face. IMPRESSION: 1. Positive for ischemia/infarction of the ventral Medulla as suspected yesterday, slightly greater on the left. No associated hemorrhage or mass effect. 2. 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 These results were communicated to Dr. Rory Percy at 11:06 am on 08/03/2021 by text page via the [facility] messaging system. Electronically Signed   By: Genevie Ann M.D.   On: 08/03/2021 11:06   CT ANGIO HEAD NECK W WO CM  Addendum Date: 08/03/2021   ADDENDUM REPORT: 08/03/2021 09:17 ADDENDUM: Study discussed by telephone with Dr. Amie Portland on 08/03/2021 at 0911 hours. Electronically Signed   By: Genevie Ann M.D.   On: 08/03/2021 09:17   Result Date: 08/03/2021 CLINICAL DATA:  66 year old male with neurologic deficit. Questionable small vessel ischemia of the medulla on brain MRI yesterday. EXAM: CT ANGIOGRAPHY HEAD AND NECK TECHNIQUE: Multidetector CT imaging of the head and neck was performed using the standard protocol during bolus administration of intravenous contrast. Multiplanar CT image reconstructions and MIPs were obtained to evaluate the vascular anatomy. Carotid stenosis measurements (when applicable) are obtained utilizing NASCET criteria, using the distal internal carotid diameter  as the denominator. RADIATION DOSE REDUCTION: This exam was performed according to the departmental dose-optimization program which includes automated exposure control, adjustment of the mA and/or kV according to patient size and/or use of iterative reconstruction technique. CONTRAST:  6m OMNIPAQUE IOHEXOL 350 MG/ML SOLN COMPARISON:  Brain MRI yesterday.  Head CT yesterday. FINDINGS: CT HEAD Brain: Bulky Calcified atherosclerosis at the skull base. Stable CT appearance of the brainstem since yesterday. No acute intracranial hemorrhage identified. No midline shift, mass effect, or evidence of intracranial mass lesion. Chronic right inferior frontal gyrus encephalomalacia, patchy bilateral cerebral white matter hypodensity more pronounced on the left. 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 No ventriculomegaly or acute cortically based infarct. Calvarium and skull base: No acute osseous abnormality identified. Paranasal sinuses: Visualized paranasal sinuses and mastoids are stable and well aerated. Orbits: Visualized orbits and scalp soft tissues are within normal limits. CTA NECK Skeleton: Periapical dental lucency bilaterally. Ordinary cervical spine degeneration. No acute osseous abnormality identified. Upper chest: Centrilobular and paraseptal emphysema. No superior mediastinal lymphadenopathy. Other neck: 15 mm hypodense right thyroid nodule. Recommend thyroid [place](ref: J Am Coll Radiol. 2015 Feb;12(2): 143-50).Dermal and subcutaneous probable 2.6 cm sebaceous cyst of the left submandibular face (series 13, image 79). Otherwise negative CT appearance of the neck soft tissues. Aortic arch: 3 vessel arch configuration. Mild arch atherosclerosis. Right carotid system: Negative brachiocephalic artery and proximal right CCA. Retropharyngeal course of the right carotid bifurcation with mild calcified plaque. Tortuous right ICA distal to the bulb, no stenosis. Left carotid system: Similar mild  tortuosity and atherosclerosis with no hemodynamically significant stenosis. Vertebral arteries: Tortuous proximal right subclavian artery with a mildly kinked appearance in the superior mediastinum. Normal right vertebral artery origin. Patent right vertebral artery to the skull base with no significant plaque or stenosis. Mild to moderate soft and  calcified plaque in the proximal left subclavian artery with less than 50 % stenosis with respect to the distal vessel. Mild atherosclerosis and stenosis at the left vertebral artery origin on series 13, image 159. Non dominant left vertebral artery than is patent to the skull base with no additional plaque or stenosis. CTA HEAD Posterior circulation: Bilateral V4 segment calcified plaque. Severe stenosis on the left (series 12, image 139) upstream of the left PICA origin which remains patent. But the left V4 segment is occluded distal to the left PICA. Contralateral right V4 moderate tandem stenoses upstream of the patent right PICA origin on series 12, image 130. And severe stenosis, near occlusion of the right vertebrobasilar junction (series 16, image 26). The basilar artery remains patent but is moderately stenotic in the mid basilar segment on series 16, image 24. Distal basilar remains patent along with SCA and PCA origins. Left posterior communicating artery is present, the right is diminutive or absent. a mild to moderate stenosis left PCA origin. And bilateral P2 segment severe stenosis, near occlusion on series 15, image 53. But there is preserved distal PCA branch enhancement bilaterally. Anterior circulation: Both ICA siphons are patent. However, on the left there is bulky soft plaque or thrombus in the supraclinoid ICA (series 14, image 162) this is between normal appearing left ophthalmic and posterior communicating artery origins. Left carotid terminus remains patent. Contralateral right ICA siphon with mild to moderate calcified plaque and mild  supraclinoid stenosis. Patent carotid termini, MCA and ACA origins. Tortuous A1 segments. Normal anterior communicating artery. Proximal A2 segments are normal. There is severe right ACA distal A2 segment stenosis, near occlusion on series 17, image 32. Left MCA M1 segment is mildly irregular. Left MCA bifurcation is patent without stenosis. Left MCA branches are mildly irregular. Mild to moderate somewhat long segment right MCA M1 stenosis along 6 mm series 16, image 20. Distal M1 and right MCA trifurcation remain patent. Right MCA branches are mildly irregular. Venous sinuses: Early contrast timing, grossly patent. Anatomic variants: Mildly dominant right vertebral artery. Review of the MIP images confirms the above findings IMPRESSION: 1. 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 - Moderate stenosis Mid Basilar Artery. - Near Occlusion bilateral PCA P2 segments, distal right ACA A2. - bulky soft plaque or thrombus in the Supraclinoid Left ICA, with up to Moderate stenosis. - mild-to-moderate Right MCA M1 stenosis. 2. Stable CT appearance of the brain since yesterday. No new intracranial abnormality. 3. Aortic Atherosclerosis (ICD10-I70.0) and Emphysema (ICD10-J43.9). Electronically Signed: By: Genevie Ann M.D. On: 08/03/2021 09:01   US Carotid Bilateral (at [facility] and [facility] only)  Result Date: 08/03/2021 CLINICAL DATA:  Follow-up examination for stroke. EXAM: BILATERAL CAROTID DUPLEX ULTRASOUND TECHNIQUE: Pearline Cables scale imaging, color Doppler and duplex ultrasound were performed of bilateral carotid and vertebral arteries in the neck. COMPARISON:  Prior Study from 04/03/2014. FINDINGS: Criteria: Quantification of carotid stenosis is based on velocity parameters that correlate the residual internal carotid diameter with NASCET-based stenosis levels, using the diameter of the distal  internal carotid lumen as the denominator for stenosis measurement. The following velocity measurements were obtained: RIGHT ICA: 84/10 cm/sec CCA: 26/83 cm/sec SYSTOLIC ICA/CCA RATIO:  1.1 ECA: 64 cm/sec LEFT ICA: 64/16 cm/sec CCA: 419/62 cm/sec SYSTOLIC ICA/CCA RATIO:  0.9 ECA: 117 cm/sec RIGHT CAROTID ARTERY: Mild smooth intimal thickening seen within the visualized right CCA without significant stenosis. Mild  heterogeneous mural based echogenic plaque seen about the right carotid bulb. No significant elevation in peak systolic velocity to suggest hemodynamically significant greater than 50% stenosis. Visualized right ICA patent distally without stenosis. RIGHT VERTEBRAL ARTERY:  Patent with antegrade flow. LEFT CAROTID ARTERY: Diffuse smooth intimal thickening seen within the visualized left CCA without significant stenosis. Mild scattered heterogeneous echogenic plaque about the left carotid bulb/proximal left ICA. No significant elevation in peak systolic velocity to suggest hemodynamically significant greater than 50% stenosis. Visualized left ICA patent distally without visible stenosis. LEFT VERTEBRAL ARTERY:  Patent with antegrade flow. IMPRESSION: 1. Mild atherosclerotic plaque within the carotid bulbs and proximal ICAs bilaterally, but no hemodynamically significant greater than 50% stenosis. 2. Patent antegrade flow within both vertebral arteries. Electronically Signed   By: Jeannine Boga M.D.   On: 08/03/2021 03:26   MR Cervical Spine Wo Contrast  Result Date: 08/03/2021 CLINICAL DATA:  Initial evaluation for ataxia, right-sided numbness. EXAM: MRI CERVICAL SPINE WITHOUT CONTRAST TECHNIQUE: Multiplanar, multisequence MR imaging of the cervical spine was performed. No intravenous contrast was administered. COMPARISON:  None available. FINDINGS: Alignment: Examination degraded by motion artifact. Straightening with mild reversal of the normal cervical lordosis. No listhesis. Vertebrae:  Vertebral body height maintained without acute or chronic fracture. Bone marrow signal intensity within normal limits. No discrete or worrisome osseous lesions or abnormal marrow edema. Cord: Normal signal and morphology. No convincing cord signal changes on this motion degraded exam. Posterior Fossa, vertebral arteries, paraspinal tissues: Unremarkable. Disc levels: C2-C3: Unremarkable. C3-C4: Mild disc bulge with endplate and uncovertebral spurring. Flattening and partial effacement of the ventral thecal sac with resultant mild spinal stenosis. Moderate bilateral C4 foraminal narrowing. C4-C5: Mild disc bulge with uncovertebral spurring. Mild flattening of the ventral thecal sac without significant spinal stenosis. Moderate right with mild left C5 foraminal stenosis. C5-C6: Minimal disc bulge with uncovertebral spurring. No spinal stenosis. Mild right C6 foraminal narrowing. Left neural foramen remains patent. C6-C7: Minimal annular disc bulge. No spinal stenosis. Foramina remain patent. C7-T1:  Unremarkable. Visualized upper thoracic spine demonstrates no significant finding. IMPRESSION: 1. Motion degraded exam. 2. No acute abnormality within the cervical spine. 3. Mild multilevel cervical spondylosis with resultant mild spinal stenosis at C3-4. Associated moderate bilateral C4 foraminal stenosis, with mild right C5 and C6 foraminal narrowing. Electronically Signed   By: Jeannine Boga M.D.   On: 08/03/2021 01:04   MR BRAIN WO CONTRAST  Result Date: 08/03/2021 CLINICAL DATA:  Initial evaluation for neuro deficit, stroke suspected. EXAM: MRI HEAD WITHOUT CONTRAST TECHNIQUE: Multiplanar, multiecho pulse sequences of the brain and surrounding structures were obtained without intravenous contrast. COMPARISON:  Prior CT from earlier the same day as well as previous MRI from 09/17/2015. FINDINGS: Brain: Generalized age-related cerebral atrophy. Patchy T2/FLAIR hyperintensity involving the periventricular and  deep white matter both cerebral hemispheres as well as the pons, most consistent with chronic small vessel ischemic disease, moderately advanced in nature. Area of encephalomalacia of involving parasagittal anterior/inferior right frontal lobe noted at site of prior hemorrhage. Additional smaller foci of encephalomalacia involving the inferior temporal poles bilaterally likely posttraumatic in nature. Few scattered small remote left cerebellar infarcts noted. Subtle area of diffusion signal abnormality involving the ventral medulla measuring up to approximately 8 mm is seen (a series 5, image 12). While this finding could potentially be artifactual, possibly due to prominent vascular calcifications about the adjacent vertebral arteries, a possible small acute ischemic infarct could be considered. No associated hemorrhage or mass effect. No  other evidence for acute or subacute ischemia. No acute intracranial hemorrhage. Single chronic microhemorrhage noted within the right cerebellum, of doubtful significance in isolation. No mass lesion, midline shift or mass effect. No hydrocephalus or extra-axial fluid collection. Pituitary gland and suprasellar region within normal limits. Vascular: Major intracranial vascular flow voids are maintained. Skull and upper cervical spine: Craniocervical junction within normal limits. Bone marrow signal intensity normal. No scalp soft tissue abnormality. Sinuses/Orbits: Globes and orbital soft tissues within normal limits. Paranasal sinuses are largely clear. No mastoid effusion. Other: None. IMPRESSION: 1. 8 mm focus of diffusion signal abnormality involving the ventral medulla, indeterminate. While this finding could potentially be artifactual in nature, a possible small acute ischemic infarct is difficult to exclude, and could be considered in the correct clinical setting. No associated hemorrhage or mass effect. 2. No other acute intracranial abnormality. 3. Underlying atrophy  with moderate chronic small vessel ischemic disease with a few small remote left cerebellar infarcts. Area of encephalomalacia of involving the anterior/inferior right frontal lobe at site of previous hemorrhage. Electronically Signed   By: Jeannine Boga M.D.   On: 08/03/2021 00:59   CT Head Wo Contrast  Result Date: 08/02/2021 CLINICAL DATA:  New right upper extremity tremor. Tingling to the arm. EXAM: CT HEAD WITHOUT CONTRAST TECHNIQUE: Contiguous axial images were obtained from the base of the skull through the vertex without intravenous contrast. RADIATION DOSE REDUCTION: This exam was performed according to the departmental dose-optimization program which includes automated exposure control, adjustment of the mA and/or kV according to patient size and/or use of iterative reconstruction technique. COMPARISON:  MRI brain 09/17/2015 FINDINGS: Brain: Mild cerebral atrophy. Mild ventricular dilatation consistent with central atrophy. Patchy low-attenuation changes in the deep white matter consistent small vessel ischemic change. Old area of encephalomalacia in the right anterior frontal lobe corresponding to previous intraparenchymal hemorrhage on prior MRI. No acute mass effect or midline shift. No abnormal extra-axial fluid collections. Gray-white matter junctions are distinct. Basal cisterns are not effaced. No acute intracranial hemorrhage. Vascular: No hyperdense vessel or unexpected calcification. Skull: Normal. Negative for fracture or focal lesion. Sinuses/Orbits: Paranasal sinuses and mastoid air cells are clear. Other: None. IMPRESSION: No acute intracranial abnormalities. Chronic atrophy and small vessel ischemic changes. 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 Electronically Signed   By: Lucienne Capers M.D.   On: 08/02/2021 21:20   DG Foot Complete Right  Result Date: 08/02/2021 CLINICAL DATA:  Chronic RIGHT arm and leg pain for since Sunday, RIGHT leg cold to touch, swelling RIGHT great toe  EXAM: RIGHT FOOT COMPLETE - 3+ VIEW COMPARISON:  None Available. FINDINGS: Osseous mineralization normal. 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 Remaining joint spaces preserved. No acute fracture, dislocation, or bone destruction. Small plantar calcaneal spur. IMPRESSION: Degenerative changes first MTP joint and small plantar calcaneal spur. No acute osseous abnormalities. Electronically Signed   By: Lavonia Dana M.D.   On: 08/02/2021 20:51     PHYSICAL EXAM  Temp:  [97.1 F (36.2 C)-100 F (37.8 C)] 97.1 F (36.2 C) (08/01 1200) Pulse Rate:  [60-126] 61 (08/01 1400) Resp:  [10-40] 18 (08/01 1400) BP: (95-229)/(62-135) 111/71 (08/01 1400) SpO2:  [89 %-100 %] 98 % (08/01 1400) FiO2 (%):  [40 %] 40 % (08/01 1130)  General - Well nourished, well developed, middle-aged African-American male tracheostomy in place on Precedex, Fentanyl drip and mechanical ventilator in the ICU. Respiratory- Tracheostomy in place, midline.   Neuro -s/p tracheostomy on  ventilator. Eyes open to voice and noxious stimuli, following midline commands, eyes in mid position but will track throughout visual fields. No movement in all extremities with painful stimuli. Dense quadriplegia and cannot move extremities.  ASSESSMENT/PLAN Mr. Gearld Kerstein is a 66 y.o. male with history of remote TBI with ICH, hypertension, hyperlipidemia, diabetes admitted for right leg/foot weakness and pain, right hand numbness, imbalance.  Gradually developed left leg weakness also during admission.  No tPA given due to outside window.  Quadriplegia on examination, attempt at extubation failed with immediate reintubation. Trach and PEG done 7/28. Significant bleeding from trach with bronchoscopy and oral reintubation on 7/31. Trach revision planned for 8/2. Hold ASA/Brilinta given plan for OR tomorrow.   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 Currently hold for trach revision and OR tomorrow.  Ongoing aggressive stroke risk factor management Therapy recommendations: SNF vs. 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 Grossly patent flow through the stent, although evaluation for possible intra stent stenosis is limited by MRA  Respiratory failure Reintubated right after extubation Trach 7/28 Weaning on ventilator- PCCM is primary Concern for evolving pneumonia on 5-day course of ceftriaxone Intratracheal bleeding associated with trach 7/31  ENT trach revision planned for 8/2  Diabetes HgbA1c 8.3 goal < 7.0 Controlled CBG monitoring SSI DM education and close PCP follow up  Hypertension Unstable BP goal < 180/105 Intermittently on Cleviprex; labile blood pressures with inline suctioning and stimulation  On home Norvasc 10 mg Previously documented allergy to Lisinopril with facial swelling, Lisinopril discontinued Metoprolol increased to 30m BID, HCTZ reinitiated with improved renal  function, hydralazine PRN added for further blood pressure control Long term BP goal normotensive  Hyperlipidemia Home meds: Lipitor 20 LDL 116, goal < 70 Now on Lipitor 80 Continue statin at discharge  Other Stroke Risk Factors Advanced age Former cigarette smoker quit smoking 33 years ago ETOH use, 1 drink per day  Other Active Problems Remote TBI with traumatic ICH   SRolanda Lundborg MD, PGY-1  I have personally obtained history,examined this patient, reviewed notes, independently viewed imaging studies, participated in medical decision making and plan of care.ROS completed by me personally and pertinent positives fully documented  I have made any additions or clarifications directly to the above note. Agree with note above.  Patient had bleeding through tracheostomy and required oral intubation and placement of tracheostomy.  Antiplatelets are on hold for elective tracheostomy revision planned by ENT in OR tomorrow.  Continue ventilatory support management as per CCM.  Long discussion with patient's  daughter at the bedside and answered questions.  Discussed with Dr. Lynetta Mare critical care medicine.This patient is critically ill and at significant risk of neurological worsening, death and care requires constant monitoring of vital signs, hemodynamics,respiratory and cardiac monitoring, extensive review of multiple databases, frequent neurological assessment, discussion with family, other specialists and medical decision making of high complexity.I have made any additions or clarifications directly to the above note.This critical care time does not reflect procedure time, or teaching time or supervisory time of PA/NP/Med Resident etc but could involve care discussion time.  I spent 30 minutes of neurocritical care time  in the care of  this patient.      Antony Contras, MD Medical Director [facility] Pager: [phone] 08/10/2021 5:21 PM   To contact Stroke Continuity  provider, please refer to [email or web address] After hours, contact General Neurology

## 2021-08-10 NOTE — Progress Notes (Signed)
PT Cancellation Note  Patient Details Name: Joshua Donovan MRN: [id number] DOB: 01/22/1955   Cancelled Treatment:    Reason Eval/Treat Not Completed: Medical issues which prohibited therapy Noted yesterday pt with bleeding from trach and reintubated.  Spoke with RN who reports trying to keep pt more calm today with possible plan to redo trach.  Recommended holding therapy - PT agrees. Will f/u at later date. Abran Richard, PT Acute Rehab [facility] [phone]   Karlton Lemon 08/10/2021, 11:01 AM

## 2021-08-10 NOTE — H&P (View-Only) (Signed)
ENT CONSULT:  Reason for Consult: Tracheostomy complication  Referring Physician:  CCM  HPI: Joshua Donovan is an 66 y.o. male with prior history of HTN, TBI with prior ICH, DM, and HLD who presented to [facility] 7/24 with right hand numbness and right LE pain and weakness.  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  He was admitted for further stroke workup.  CTA angio head revealed multiple intracranial severe stenoses.  He was transferred to [facility] for further stroke care and Neuro IR evaluation on 7/25.  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  A percutaneous trach was placed by critical care on 08/06/2021 with size 8 Shiley.  He also had PEG tube placement at that time.  Patient is paraplegic, communicates with blinking.  On 731, patient was noted to have significant bleeding from tracheostomy site, which was thought to be secondary to tracheal wall irritation by the Shiley trach, and trach was removed.  Patient was subsequently intubated from above, and ENT consulted for further management of trach.  Past Medical History:  Diagnosis Date   Diabetes mellitus without complication (HCC)    GERD (gastroesophageal reflux disease)    Hemorrhage in the brain ([place]) 08/2015   High cholesterol    Hyperlipidemia    Hypertension    Hypertensive emergency 08/03/2021    Past Surgical History:  Procedure Laterality Date   COLONOSCOPY WITH PROPOFOL N/A 09/08/2017   Procedure: COLONOSCOPY WITH PROPOFOL;  Surgeon: Lucilla Lame, MD;  Location: [facility];  Service: Endoscopy;  Laterality: N/A;  Diabetic - oral meds   IR ANGIO INTRA EXTRACRAN SEL COM CAROTID INNOMINATE BILAT MOD SED  08/04/2021   IR ANGIO VERTEBRAL SEL SUBCLAVIAN INNOMINATE UNI L MOD SED  08/04/2021   IR ANGIO VERTEBRAL SEL VERTEBRAL UNI R MOD SED  08/04/2021   IR CT HEAD LTD  08/04/2021   IR INTRA CRAN STENT   08/04/2021   IR US GUIDE VASC ACCESS RIGHT  08/04/2021   partial intestine removed     approx date: 1980   POLYPECTOMY  09/08/2017   Procedure: POLYPECTOMY;  Surgeon: Lucilla Lame, MD;  Location: [facility];  Service: Endoscopy;;   RADIOLOGY WITH ANESTHESIA N/A 08/04/2021   Procedure: Angioplasty/stenting of vertebrobasilar stenosis;  Surgeon: Luanne Bras, MD;  Location: [facility];  Service: Radiology;  Laterality: N/A;    Family History  Problem Relation Age of Onset   Hypertension Father    Diabetes Sister     Social History:  reports that he quit smoking about 33 years ago. He has never used smokeless tobacco. He reports current alcohol use of about 7.0 standard drinks of alcohol per week. He reports that he does not use drugs.  Allergies:  Allergies  Allergen Reactions   Lisinopril Anaphylaxis and Swelling    Angioedema (filled but not taking per family, 08/08/21).  TDD.   Anchovies [Fish Allergy] Swelling   Sulfa Antibiotics Swelling    Medications: I have reviewed the patient's current medications.  Results for orders placed or performed during the hospital encounter of 08/03/21 (from the past 48 hour(s))  Glucose, capillary     Status: Abnormal   Collection Time: 08/08/21  3:38 PM  Result Value Ref Range   Glucose-Capillary 229 (H) 70 - 99 mg/dL    Comment: Glucose reference range applies only to samples taken after fasting for at least 8 hours.  Glucose, capillary  Status: Abnormal   Collection Time: 08/08/21  7:30 PM  Result Value Ref Range   Glucose-Capillary 312 (H) 70 - 99 mg/dL    Comment: Glucose reference range applies only to samples taken after fasting for at least 8 hours.  Glucose, capillary     Status: Abnormal   Collection Time: 08/08/21 11:24 PM  Result Value Ref Range   Glucose-Capillary 252 (H) 70 - 99 mg/dL    Comment: Glucose reference range applies only to samples taken after fasting for at least 8 hours.  Basic metabolic panel      Status: Abnormal   Collection Time: 08/09/21  1:57 AM  Result Value Ref Range   Sodium 143 135 - 145 mmol/L   Potassium 3.4 (L) 3.5 - 5.1 mmol/L   Chloride 105 98 - 111 mmol/L   CO2 29 22 - 32 mmol/L   Glucose, Bld 212 (H) 70 - 99 mg/dL    Comment: Glucose reference range applies only to samples taken after fasting for at least 8 hours.   BUN 15 8 - 23 mg/dL   Creatinine, Ser 0.70 0.61 - 1.24 mg/dL   Calcium 9.2 8.9 - 10.3 mg/dL   GFR, Estimated >60 >60 mL/min    Comment: (NOTE) Calculated using the CKD-EPI Creatinine Equation (2021)    Anion gap 9 5 - 15    Comment: Performed at [facility] [address], [place] [address]  CBC     Status: None   Collection Time: 08/09/21  1:57 AM  Result Value Ref Range   WBC 9.6 4.0 - 10.5 K/uL   RBC 4.62 4.22 - 5.81 MIL/uL   Hemoglobin 13.3 13.0 - 17.0 g/dL   HCT 41.3 39.0 - 52.0 %   MCV 89.4 80.0 - 100.0 fL   MCH 28.8 26.0 - 34.0 pg   MCHC 32.2 30.0 - 36.0 g/dL   RDW 14.4 11.5 - 15.5 %   Platelets 343 150 - 400 K/uL   nRBC 0.0 0.0 - 0.2 %    Comment: Performed at [facility], [place] [address] [place], [place] [address]  Glucose, capillary     Status: Abnormal   Collection Time: 08/09/21  3:15 AM  Result Value Ref Range   Glucose-Capillary 196 (H) 70 - 99 mg/dL    Comment: Glucose reference range applies only to samples taken after fasting for at least 8 hours.  Glucose, capillary     Status: Abnormal   Collection Time: 08/09/21  7:40 AM  Result Value Ref Range   Glucose-Capillary 211 (H) 70 - 99 mg/dL    Comment: Glucose reference range applies only to samples taken after fasting for at least 8 hours.   Comment 1 Notify RN    Comment 2 Document in Chart   Glucose, capillary     Status: Abnormal   Collection Time: 08/09/21 11:17 AM  Result Value Ref Range   Glucose-Capillary 226 (H) 70 - 99 mg/dL    Comment: Glucose reference range applies only to samples taken after fasting for at least 8 hours.   Comment  1 Notify RN    Comment 2 Document in Chart   Glucose, capillary     Status: Abnormal   Collection Time: 08/09/21  3:21 PM  Result Value Ref Range   Glucose-Capillary 240 (H) 70 - 99 mg/dL    Comment: Glucose reference range applies only to samples taken after fasting for at least 8 hours.   Comment 1  Notify RN    Comment 2 Document in Chart   Glucose, capillary     Status: Abnormal   Collection Time: 08/09/21  7:23 PM  Result Value Ref Range   Glucose-Capillary 190 (H) 70 - 99 mg/dL    Comment: Glucose reference range applies only to samples taken after fasting for at least 8 hours.  Glucose, capillary     Status: Abnormal   Collection Time: 08/09/21 11:14 PM  Result Value Ref Range   Glucose-Capillary 241 (H) 70 - 99 mg/dL    Comment: Glucose reference range applies only to samples taken after fasting for at least 8 hours.  Glucose, capillary     Status: Abnormal   Collection Time: 08/10/21  3:20 AM  Result Value Ref Range   Glucose-Capillary 189 (H) 70 - 99 mg/dL    Comment: Glucose reference range applies only to samples taken after fasting for at least 8 hours.  CBC with Differential/Platelet     Status: Abnormal   Collection Time: 08/10/21  6:23 AM  Result Value Ref Range   WBC 7.3 4.0 - 10.5 K/uL   RBC 4.12 (L) 4.22 - 5.81 MIL/uL   Hemoglobin 12.0 (L) 13.0 - 17.0 g/dL   HCT 37.7 (L) 39.0 - 52.0 %   MCV 91.5 80.0 - 100.0 fL   MCH 29.1 26.0 - 34.0 pg   MCHC 31.8 30.0 - 36.0 g/dL   RDW 14.4 11.5 - 15.5 %   Platelets 325 150 - 400 K/uL   nRBC 0.0 0.0 - 0.2 %   Neutrophils Relative % 68 %   Neutro Abs 5.0 1.7 - 7.7 K/uL   Lymphocytes Relative 13 %   Lymphs Abs 1.0 0.7 - 4.0 K/uL   Monocytes Relative 14 %   Monocytes Absolute 1.0 0.1 - 1.0 K/uL   Eosinophils Relative 3 %   Eosinophils Absolute 0.2 0.0 - 0.5 K/uL   Basophils Relative 1 %   Basophils Absolute 0.1 0.0 - 0.1 K/uL   Immature Granulocytes 1 %   Abs Immature Granulocytes 0.04 0.00 - 0.07 K/uL    Comment:  Performed at [facility], [address], [place], [place] [address]  Comprehensive metabolic panel     Status: Abnormal   Collection Time: 08/10/21  6:23 AM  Result Value Ref Range   Sodium 143 135 - 145 mmol/L   Potassium 4.0 3.5 - 5.1 mmol/L   Chloride 108 98 - 111 mmol/L   CO2 30 22 - 32 mmol/L   Glucose, Bld 258 (H) 70 - 99 mg/dL    Comment: Glucose reference range applies only to samples taken after fasting for at least 8 hours.   BUN 23 8 - 23 mg/dL   Creatinine, Ser 0.73 0.61 - 1.24 mg/dL   Calcium 9.0 8.9 - 10.3 mg/dL   Total Protein 6.0 (L) 6.5 - 8.1 g/dL   Albumin 2.5 (L) 3.5 - 5.0 g/dL   AST 30 15 - 41 U/L   ALT 50 (H) 0 - 44 U/L   Alkaline Phosphatase 30 (L) 38 - 126 U/L   Total Bilirubin 0.5 0.3 - 1.2 mg/dL   GFR, Estimated >60 >60 mL/min    Comment: (NOTE) Calculated using the CKD-EPI Creatinine Equation (2021)    Anion gap 5 5 - 15    Comment: Performed at [facility], [place] [address] [place], [place] [address]  Glucose, capillary     Status: Abnormal   Collection Time: 08/10/21  7:37 AM  Result Value Ref Range   Glucose-Capillary 223 (H) 70 - 99 mg/dL    Comment: Glucose reference range applies only to samples taken after fasting for at least 8 hours.   Comment 1 Notify RN    Comment 2 Document in Chart   Glucose, capillary     Status: Abnormal   Collection Time: 08/10/21 11:38 AM  Result Value Ref Range   Glucose-Capillary 248 (H) 70 - 99 mg/dL    Comment: Glucose reference range applies only to samples taken after fasting for at least 8 hours.   Comment 1 Notify RN    Comment 2 Document in Chart     DG CHEST PORT 1 VIEW  Result Date: 08/09/2021 CLINICAL DATA:  Intubation. EXAM: PORTABLE CHEST 1 VIEW COMPARISON:  Same day. FINDINGS: The heart size and mediastinal contours are within normal limits. Endotracheal tube is noted in grossly good position. Minimal bibasilar subsegmental atelectasis is noted. The visualized skeletal structures are  unremarkable. IMPRESSION: Endotracheal tube in grossly good position. Electronically Signed   By: Marijo Conception M.D.   On: 08/09/2021 16:54   DG CHEST PORT 1 VIEW  Result Date: 08/09/2021 CLINICAL DATA:  Tracheostomy dependent. EXAM: PORTABLE CHEST 1 VIEW COMPARISON:  08/06/2021 FINDINGS: The tracheostomy tube tip is above the carina. Heart size and mediastinal contours are stable. Mild left lung base atelectasis with volume loss is stable from the previous exam. Right lung is clear. IMPRESSION: Stable exam. Electronically Signed   By: Kerby Moors M.D.   On: 08/09/2021 08:47    ROS:ROS  Blood pressure 120/70, pulse 60, temperature 98.2 F (36.8 C), temperature source Axillary, resp. rate 18, height '5\' 7"'$  (1.702 m), weight 84.4 kg, SpO2 97 %.  PHYSICAL EXAM: CONSTITUTIONAL: sedated on vent HENT: Head : normocephalic and atraumatic NECK: Anterior neck wound from previous percutaneous tracheostomy site and midline neck, with blood clot suctioned from wound.  Unable to visualize tracheostomy lumen with direct visualization.  Tracheoscopy was also attempted with disposable bronc, and I was unable to enter into the trachea, suspect percutaneous site has completely healed.  Studies Reviewed:  Assessment/Plan: Raijon Lindfors is 66 y/o M with ventral medullary infarct, status post percutaneous tracheostomy placement on 08/06/2021, which was subsequently removed on 7/31 after significant bleeding was noted from tracheostomy site.  Patient is currently orotracheally intubated, stable on vent.  Bedside assessment demonstrates that the percutaneous tracheostomy site has completely healed, I am unable to visualize tracheostomy even with fiberoptic scope. -To OR tomorrow for tracheostomy revision -Tentatively scheduled for 1500 08/02, may be moved up if OR available -Continue to hold all blood thinning agents -Hold tube feeds at midnight -Discussed need for surgery with patient's daughter, who is  agreeable.  Thank you for allowing me to participate in the care of this patient. Please do not hesitate to contact me with any questions or concerns.   Jason Coop, [facility] Cell: [phone]   08/10/2021, 12:12 PM

## 2021-08-10 NOTE — Consult Note (Addendum)
ENT CONSULT:  Reason for Consult: Tracheostomy complication  Referring Physician:  CCM  HPI: Joshua Donovan is an 66 y.o. male with prior history of HTN, TBI with prior ICH, DM, and HLD who presented to [facility] 7/24 with right hand numbness and right LE pain and weakness.  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  He was admitted for further stroke workup.  CTA angio head revealed multiple intracranial severe stenoses.  He was transferred to [facility] for further stroke care and Neuro IR evaluation on 7/25.  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  A percutaneous trach was placed by critical care on 08/06/2021 with size 8 Shiley.  He also had PEG tube placement at that time.  Patient is paraplegic, communicates with blinking.  On 731, patient was noted to have significant bleeding from tracheostomy site, which was thought to be secondary to tracheal wall irritation by the Shiley trach, and trach was removed.  Patient was subsequently intubated from above, and ENT consulted for further management of trach.  Past Medical History:  Diagnosis Date   Diabetes mellitus without complication (HCC)    GERD (gastroesophageal reflux disease)    Hemorrhage in the brain ([place]) 08/2015   High cholesterol    Hyperlipidemia    Hypertension    Hypertensive emergency 08/03/2021    Past Surgical History:  Procedure Laterality Date   COLONOSCOPY WITH PROPOFOL N/A 09/08/2017   Procedure: COLONOSCOPY WITH PROPOFOL;  Surgeon: Lucilla Lame, MD;  Location: [place];  Service: Endoscopy;  Laterality: N/A;  Diabetic - oral meds   IR ANGIO INTRA EXTRACRAN SEL COM CAROTID INNOMINATE BILAT MOD SED  08/04/2021   IR ANGIO VERTEBRAL SEL SUBCLAVIAN INNOMINATE UNI L MOD SED  08/04/2021   IR ANGIO VERTEBRAL SEL VERTEBRAL UNI R MOD SED  08/04/2021   IR CT HEAD LTD  08/04/2021   IR INTRA CRAN STENT   08/04/2021   IR US GUIDE VASC ACCESS RIGHT  08/04/2021   partial intestine removed     approx date: 1980   POLYPECTOMY  09/08/2017   Procedure: POLYPECTOMY;  Surgeon: Lucilla Lame, MD;  Location: [place];  Service: Endoscopy;;   RADIOLOGY WITH ANESTHESIA N/A 08/04/2021   Procedure: Angioplasty/stenting of vertebrobasilar stenosis;  Surgeon: Luanne Bras, MD;  Location: [place];  Service: Radiology;  Laterality: N/A;    Family History  Problem Relation Age of Onset   Hypertension Father    Diabetes Sister     Social History:  reports that he quit smoking about 33 years ago. He has never used smokeless tobacco. He reports current alcohol use of about 7.0 standard drinks of alcohol per week. He reports that he does not use drugs.  Allergies:  Allergies  Allergen Reactions   Lisinopril Anaphylaxis and Swelling    Angioedema (filled but not taking per family, 08/08/21).  TDD.   Anchovies [Fish Allergy] Swelling   Sulfa Antibiotics Swelling    Medications: I have reviewed the patient's current medications.  Results for orders placed or performed during the hospital encounter of 08/03/21 (from the past 48 hour(s))  Glucose, capillary     Status: Abnormal   Collection Time: 08/08/21  3:38 PM  Result Value Ref Range   Glucose-Capillary 229 (H) 70 - 99 mg/dL    Comment: Glucose reference range applies only to samples taken after fasting for at least 8 hours.  Glucose, capillary  Status: Abnormal   Collection Time: 08/08/21  7:30 PM  Result Value Ref Range   Glucose-Capillary 312 (H) 70 - 99 mg/dL    Comment: Glucose reference range applies only to samples taken after fasting for at least 8 hours.  Glucose, capillary     Status: Abnormal   Collection Time: 08/08/21 11:24 PM  Result Value Ref Range   Glucose-Capillary 252 (H) 70 - 99 mg/dL    Comment: Glucose reference range applies only to samples taken after fasting for at least 8 hours.  Basic metabolic panel      Status: Abnormal   Collection Time: 08/09/21  1:57 AM  Result Value Ref Range   Sodium 143 135 - 145 mmol/L   Potassium 3.4 (L) 3.5 - 5.1 mmol/L   Chloride 105 98 - 111 mmol/L   CO2 29 22 - 32 mmol/L   Glucose, Bld 212 (H) 70 - 99 mg/dL    Comment: Glucose reference range applies only to samples taken after fasting for at least 8 hours.   BUN 15 8 - 23 mg/dL   Creatinine, Ser 0.70 0.61 - 1.24 mg/dL   Calcium 9.2 8.9 - 10.3 mg/dL   GFR, Estimated >60 >60 mL/min    Comment: (NOTE) Calculated using the CKD-EPI Creatinine Equation (2021)    Anion gap 9 5 - 15    Comment: Performed at [facility] [address] [place] [address]  CBC     Status: None   Collection Time: 08/09/21  1:57 AM  Result Value Ref Range   WBC 9.6 4.0 - 10.5 K/uL   RBC 4.62 4.22 - 5.81 MIL/uL   Hemoglobin 13.3 13.0 - 17.0 g/dL   HCT 41.3 39.0 - 52.0 %   MCV 89.4 80.0 - 100.0 fL   MCH 28.8 26.0 - 34.0 pg   MCHC 32.2 30.0 - 36.0 g/dL   RDW 14.4 11.5 - 15.5 %   Platelets 343 150 - 400 K/uL   nRBC 0.0 0.0 - 0.2 %    Comment: Performed at [facility], [place] [address], [place], [place] [address]  Glucose, capillary     Status: Abnormal   Collection Time: 08/09/21  3:15 AM  Result Value Ref Range   Glucose-Capillary 196 (H) 70 - 99 mg/dL    Comment: Glucose reference range applies only to samples taken after fasting for at least 8 hours.  Glucose, capillary     Status: Abnormal   Collection Time: 08/09/21  7:40 AM  Result Value Ref Range   Glucose-Capillary 211 (H) 70 - 99 mg/dL    Comment: Glucose reference range applies only to samples taken after fasting for at least 8 hours.   Comment 1 Notify RN    Comment 2 Document in Chart   Glucose, capillary     Status: Abnormal   Collection Time: 08/09/21 11:17 AM  Result Value Ref Range   Glucose-Capillary 226 (H) 70 - 99 mg/dL    Comment: Glucose reference range applies only to samples taken after fasting for at least 8 hours.   Comment  1 Notify RN    Comment 2 Document in Chart   Glucose, capillary     Status: Abnormal   Collection Time: 08/09/21  3:21 PM  Result Value Ref Range   Glucose-Capillary 240 (H) 70 - 99 mg/dL    Comment: Glucose reference range applies only to samples taken after fasting for at least 8 hours.   Comment 1  Notify RN    Comment 2 Document in Chart   Glucose, capillary     Status: Abnormal   Collection Time: 08/09/21  7:23 PM  Result Value Ref Range   Glucose-Capillary 190 (H) 70 - 99 mg/dL    Comment: Glucose reference range applies only to samples taken after fasting for at least 8 hours.  Glucose, capillary     Status: Abnormal   Collection Time: 08/09/21 11:14 PM  Result Value Ref Range   Glucose-Capillary 241 (H) 70 - 99 mg/dL    Comment: Glucose reference range applies only to samples taken after fasting for at least 8 hours.  Glucose, capillary     Status: Abnormal   Collection Time: 08/10/21  3:20 AM  Result Value Ref Range   Glucose-Capillary 189 (H) 70 - 99 mg/dL    Comment: Glucose reference range applies only to samples taken after fasting for at least 8 hours.  CBC with Differential/Platelet     Status: Abnormal   Collection Time: 08/10/21  6:23 AM  Result Value Ref Range   WBC 7.3 4.0 - 10.5 K/uL   RBC 4.12 (L) 4.22 - 5.81 MIL/uL   Hemoglobin 12.0 (L) 13.0 - 17.0 g/dL   HCT 37.7 (L) 39.0 - 52.0 %   MCV 91.5 80.0 - 100.0 fL   MCH 29.1 26.0 - 34.0 pg   MCHC 31.8 30.0 - 36.0 g/dL   RDW 14.4 11.5 - 15.5 %   Platelets 325 150 - 400 K/uL   nRBC 0.0 0.0 - 0.2 %   Neutrophils Relative % 68 %   Neutro Abs 5.0 1.7 - 7.7 K/uL   Lymphocytes Relative 13 %   Lymphs Abs 1.0 0.7 - 4.0 K/uL   Monocytes Relative 14 %   Monocytes Absolute 1.0 0.1 - 1.0 K/uL   Eosinophils Relative 3 %   Eosinophils Absolute 0.2 0.0 - 0.5 K/uL   Basophils Relative 1 %   Basophils Absolute 0.1 0.0 - 0.1 K/uL   Immature Granulocytes 1 %   Abs Immature Granulocytes 0.04 0.00 - 0.07 K/uL    Comment:  Performed at [facility], [address], [place], [place] [address]  Comprehensive metabolic panel     Status: Abnormal   Collection Time: 08/10/21  6:23 AM  Result Value Ref Range   Sodium 143 135 - 145 mmol/L   Potassium 4.0 3.5 - 5.1 mmol/L   Chloride 108 98 - 111 mmol/L   CO2 30 22 - 32 mmol/L   Glucose, Bld 258 (H) 70 - 99 mg/dL    Comment: Glucose reference range applies only to samples taken after fasting for at least 8 hours.   BUN 23 8 - 23 mg/dL   Creatinine, Ser 0.73 0.61 - 1.24 mg/dL   Calcium 9.0 8.9 - 10.3 mg/dL   Total Protein 6.0 (L) 6.5 - 8.1 g/dL   Albumin 2.5 (L) 3.5 - 5.0 g/dL   AST 30 15 - 41 U/L   ALT 50 (H) 0 - 44 U/L   Alkaline Phosphatase 30 (L) 38 - 126 U/L   Total Bilirubin 0.5 0.3 - 1.2 mg/dL   GFR, Estimated >60 >60 mL/min    Comment: (NOTE) Calculated using the CKD-EPI Creatinine Equation (2021)    Anion gap 5 5 - 15    Comment: Performed at [facility], [place] [address], [place], [place] [address]  Glucose, capillary     Status: Abnormal   Collection Time: 08/10/21  7:37 AM  Result Value Ref Range   Glucose-Capillary 223 (H) 70 - 99 mg/dL    Comment: Glucose reference range applies only to samples taken after fasting for at least 8 hours.   Comment 1 Notify RN    Comment 2 Document in Chart   Glucose, capillary     Status: Abnormal   Collection Time: 08/10/21 11:38 AM  Result Value Ref Range   Glucose-Capillary 248 (H) 70 - 99 mg/dL    Comment: Glucose reference range applies only to samples taken after fasting for at least 8 hours.   Comment 1 Notify RN    Comment 2 Document in Chart     DG CHEST PORT 1 VIEW  Result Date: 08/09/2021 CLINICAL DATA:  Intubation. EXAM: PORTABLE CHEST 1 VIEW COMPARISON:  Same day. FINDINGS: The heart size and mediastinal contours are within normal limits. Endotracheal tube is noted in grossly good position. Minimal bibasilar subsegmental atelectasis is noted. The visualized skeletal structures are  unremarkable. IMPRESSION: Endotracheal tube in grossly good position. Electronically Signed   By: Marijo Conception M.D.   On: 08/09/2021 16:54   DG CHEST PORT 1 VIEW  Result Date: 08/09/2021 CLINICAL DATA:  Tracheostomy dependent. EXAM: PORTABLE CHEST 1 VIEW COMPARISON:  08/06/2021 FINDINGS: The tracheostomy tube tip is above the carina. Heart size and mediastinal contours are stable. Mild left lung base atelectasis with volume loss is stable from the previous exam. Right lung is clear. IMPRESSION: Stable exam. Electronically Signed   By: Kerby Moors M.D.   On: 08/09/2021 08:47    ROS:ROS  Blood pressure 120/70, pulse 60, temperature 98.2 F (36.8 C), temperature source Axillary, resp. rate 18, height '5\' 7"'$  (1.702 m), weight 84.4 kg, SpO2 97 %.  PHYSICAL EXAM: CONSTITUTIONAL: sedated on vent HENT: Head : normocephalic and atraumatic NECK: Anterior neck wound from previous percutaneous tracheostomy site and midline neck, with blood clot suctioned from wound.  Unable to visualize tracheostomy lumen with direct visualization.  Tracheoscopy was also attempted with disposable bronc, and I was unable to enter into the trachea, suspect percutaneous site has completely healed.  Studies Reviewed:  Assessment/Plan: Joshua Donovan is 66 y/o M with ventral medullary infarct, status post percutaneous tracheostomy placement on 08/06/2021, which was subsequently removed on 7/31 after significant bleeding was noted from tracheostomy site.  Patient is currently orotracheally intubated, stable on vent.  Bedside assessment demonstrates that the percutaneous tracheostomy site has completely healed, I am unable to visualize tracheostomy even with fiberoptic scope. -To OR tomorrow for tracheostomy revision -Tentatively scheduled for 1500 08/02, may be moved up if OR available -Continue to hold all blood thinning agents -Hold tube feeds at midnight -Discussed need for surgery with patient's daughter, who is  agreeable.  Thank you for allowing me to participate in the care of this patient. Please do not hesitate to contact me with any questions or concerns.   Jason Coop, [place] [facility] Cell: [phone]   08/10/2021, 12:12 PM

## 2021-08-10 NOTE — Progress Notes (Signed)
SLP Cancellation Note  Patient Details Name: Joshua Donovan MRN: [id number] DOB: 10-12-1955   Cancelled treatment:  Pt was orally intubated yesterday due to inability to pass new trach through stoma.  Currently getting new trach.   This SLP contacted rep from [employer] today for help with potential augmentative communication devices for Joshua Donovan.  Will continue to work with him in an effort to find functional alternative communication.     Seraiah Nowack L. Tivis Ringer, MA CCC/SLP Clinical Specialist - Acute Care SLP Acute Rehabilitation Services Office number [phone]  Joshua Donovan 08/10/2021, 11:59 AM

## 2021-08-10 NOTE — Progress Notes (Signed)
NAME:  Joshua Donovan, MRN:  [id number], DOB:  1955-01-21, LOS: 7 ADMISSION DATE:  08/03/2021, CONSULTATION DATE:  08/04/21 REFERRING MD:  Dr. Gerhard Perches, CHIEF COMPLAINT:  CVA   History of Present Illness:  66 year old man who presented to [facility] 7/24 with right hand numbness, right LE pain and weakness.  PMHx significant for HTN, TBI with prior ICH, DM, and HLD.  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 Patient's weakness worsened to include right arm, right leg and left leg.  CTA Head/Neck revealed multiple severe intracranial stenoses, near-occlusive stenosis of the vertebrobasilar junction, occlusion of the distal left V4, moderate mid basilar stenosis, nearly occlusive bilateral PCA P2 segments, nearly occlusive distal right ACA A2 segments and bulky soft plaque or thrombus in the supraclinoid left ICA with mild to moderate right M1 stenosis.    Subsequently transferred to [facility] for further stroke care/Neuro IR evaluation 7/25.  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  Additionally, occlusion of left V4 distal to origin of PICA and patent bilateral P-comm arteries noted.  Heparin gtt and Plavix initiated.  Remains on Cleviprex gtt for strict SBP parameters.  Patient left intubated.  PCCM consulted for further ventilator management.  Patient underwent percutaneous tracheostomy placement 7/28 c/b bleeding (7/31) requiring bronchoscopic evaluation, oral reintubation and tracheostomy removal with stoma packing. ENT consulted.  Pertinent Medical History:  TBI with ICH, DM, HTN, HLD  Significant Hospital Events: Including procedures, antibiotic start and stop dates in addition to other pertinent events   7/24 presented to [facility], ventral medulla CVA 7/25 tx to [facility] 7/26 cerebral angiogram with stent placement to right vertebro-basilar junction,  failed extubation, left radial aline MRI brain> mild extension of stroke, now involving bilateral medial medullary, patent basilar artery and R VBJ stent  7/28 underwent bedside percutaneous tracheostomy and PEG tube placement, tolerated well 7/29 no acute issues overnight currently on SBT trial this a.m. and tolerating well, Cleviprex resumed earlier this a.m. due to severe hypertension 7/30 Hypertension persist despite adding oral agents, glucose remains elevated as well 7/31 Bleeding around trach site, bright red blood. Copious bloody secretions. Cleviprex off, SBPs 150s-160s. Basal insulin/TF coverage increased. Cefepime deescalated to ceftriaxone. CXR stable. Later in afternoon, continued peritrach bleeding. Bronchoscopic eval completed with intratracheal bleeding associated with trach. Removed, patient orally reintubated, stoma packed. 8/1 ENT consulted for trach revision. Lightly sedated. Vent full support/PRVC, given fatigue following events of yesterday. ENT attempted to evaluate trach at bedside, could not pass scope. Plan for OR 8/2. ASA/Brilinta held.   Interim History / Subjective:  No significant events overnight Significant bleeding from trach 7/31 with bronch, trach removal and oral intubation (see Procedure notes form 7/31) Remains on vent, full support via oral ETT Appears fatigued, frustrated ENT consulted for trach revision ASA/Brilinta held Attempted bedside trach revision/replacement, unable to pass scope Plan for OR 1500 8/2, NPO/TF held at midnight  Objective:  Blood pressure 129/70, pulse 60, temperature 98.2 F (36.8 C), temperature source Axillary, resp. rate 18, height _0  (1.702 m), weight 84.4 kg, SpO2 97 %.    Vent Mode: PRVC FiO2 (%):  [40 %] 40 % Set Rate:  [18 bmp] 18 bmp Vt Set:  [520 mL] 520 mL PEEP:  [5 cmH20] 5 cmH20 Pressure Support:  [10 cmH20] 10 cmH20 Plateau Pressure:  [15 cmH20-17 cmH20] 17 cmH20   Intake/Output Summary (Last 24 hours) at  08/10/2021 1050 Last data filed at 08/10/2021 1000 Gross per 24 hour  Intake 2833.71 ml  Output 950 ml  Net 1883.71 ml    Filed Weights   08/04/21 0914 08/07/21 0500 08/08/21 1445  Weight: 83.9 kg 89.1 kg 84.4 kg   Physical Examination: General: Acutely ill-appearing middle-aged man in NAD. HEENT: [place]/AT, anicteric sclera, PERRL, moist mucous membranes. ETT in place. Trach stoma with scant bloody drainage, covered/packed with gauze/pressure dressing. Neuro: Sedated. Responds to verbal stimuli, blinking for communication (yes, no). No spontaneous movement of extremities in the setting of brainstem stroke/quadriplegia. +Corneal, +Cough, and +Gag  CV: RRR, no m/g/r. PULM: Breathing even and unlabored on vent (PEEP 5, FiO2 40%). Lung fields coarse bilatereally. GI: Soft, nontender, nondistended. Normoactive bowel sounds. +PEG, binder in place. Extremities: Trace symmetric BLE edema noted. Skin: Warm/dry, no rashes.  Resolved Hospital Problem List:   Assessment & Plan:   Acute hypoxemic respiratory failure in the setting of stroke S/p percutaneous tracheostomy and PEG tube placement 7/28. Peritracheostomy bleeding resulting in trach removal 7/31 Enterobacter pneumonia Increased tracheal secretions 7/28 resulting in BAL; empiric antibiotics started. Resp Cx with enterobacter aerogenes. Deescalated from cefepime to ceftriaxone. Trach with bleeding noted 7/31 requiring bronchoscopic evaluation, tracheal wall irritation noted and trach removed, reintubated oropharyngeallly. - Continue full vent support (4-8cc/kg IBW) - Wean FiO2 for O2 sat > 90% - Daily WUA/SBT, goal eventual transition to trach collar once tracheostomy is revised/replaced - ENT consulted 8/1, appreciate assistance with management; evaluated at bedside; unfortunately prior perc trach site was not amenable to bedside replacement/revision - Plan for OR 8/2 1500, NPO at midnight/TF held - ASA/Brilinta held 8/1 - Stoma wound care  with gauze/pressure dressing for now - VAP bundle - Pulmonary hygiene - PAD protocol for sedation: Precedex and Fentanyl for goal RASS 0 to -1 - Ceftriaxone x 7 day total course (end 8/3)  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 SNF at discharge, pending respiratory/trach/vent status  HTN emergency Grade 1 diastolic dysfunction seen on echocardiogram -TTE EF 60-65%, no WMA, G1DD, normal RV/ valves History of hyperlipidemia - SBP goal < 180 - Continue home lisinopril, Lopressor, amlodipine - ASA/Brilinta held for now - Continue statin  T2DM with hyperglycemia A1c 8.3 - Glucoses slightly improved 8/1 - Continue Levemir 16U BID - TF coverage 4U Q4H, room to increase if needed - SSI, resistant scale - CBGs Q4H, goal 140-180  ?Hx of ETOH abuse Reported 1 beer a day. - Monitor for signs/symptoms of withdrawal - Precedex for sedation, wean as able - CIWA - Continue thiamine, folate, MV  Best Practice (right click and "Reselect all SmartList Selections" daily)   Diet/type: NPO;  TF per RD recs> restart 4hrs post PEG.  Scheduled bowel regimen DVT prophylaxis: prophylactic heparin  GI prophylaxis: PPI Lines: N/A Foley:  Yes, and it is no longer needed and removal ordered > frequent bladder scans to monitor for retention Code Status:  full code Last date of multidisciplinary goals of care discussion: Full code. Discussed with daughter at bedside 8/1AM.  Critical care time: 40 minutes   The patient is critically ill with  multiple organ system failure and requires high complexity decision making for assessment and support, frequent evaluation and titration of therapies, advanced monitoring, review of radiographic studies and interpretation of complex data.   Critical Care Time devoted to patient care services, exclusive of separately billable procedures, described in this note is 40 minutes.  Lestine Mount, PA-C  Pulmonary & Critical Care 08/10/21 10:50 AM  Please see [email or web address] for pager details.  From 7A-7P if no response, please call [phone] After hours, please call ELink [phone]

## 2021-08-10 NOTE — Progress Notes (Signed)
Pt stoma packing and dressing removed by MD. RT placed pressure dressing over stoma per MD with MD at bedside. Pt tolerated well, vitals stable, RT will monitor.

## 2021-08-11 ENCOUNTER — Encounter (HOSPITAL_COMMUNITY): Payer: Self-pay | Admitting: Internal Medicine

## 2021-08-11 ENCOUNTER — Encounter (HOSPITAL_COMMUNITY)
Admission: AD | Disposition: A | Discharge status: Skilled Nursing Facility | Payer: Self-pay | Source: Other Acute Inpatient Hospital | Attending: Internal Medicine

## 2021-08-11 ENCOUNTER — Inpatient Hospital Stay (HOSPITAL_COMMUNITY): Payer: Medicare PPO | Admitting: Certified Registered Nurse Anesthetist

## 2021-08-11 DIAGNOSIS — I10 Essential (primary) hypertension: Secondary | ICD-10-CM

## 2021-08-11 DIAGNOSIS — J9500 Unspecified tracheostomy complication: Secondary | ICD-10-CM

## 2021-08-11 DIAGNOSIS — J9690 Respiratory failure, unspecified, unspecified whether with hypoxia or hypercapnia: Secondary | ICD-10-CM

## 2021-08-11 DIAGNOSIS — Z87891 Personal history of nicotine dependence: Secondary | ICD-10-CM

## 2021-08-11 DIAGNOSIS — J9601 Acute respiratory failure with hypoxia: Secondary | ICD-10-CM

## 2021-08-11 DIAGNOSIS — J9503 Malfunction of tracheostomy stoma: Secondary | ICD-10-CM | POA: Diagnosis not present

## 2021-08-11 HISTORY — PX: TRACHEOSTOMY TUBE PLACEMENT: SHX814

## 2021-08-11 LAB — GLUCOSE, CAPILLARY
Glucose-Capillary: 202 mg/dL — ABNORMAL HIGH (ref 70–99)
Glucose-Capillary: 96 mg/dL (ref 70–99)
Glucose-Capillary: 83 mg/dL (ref 70–99)
Glucose-Capillary: 139 mg/dL — ABNORMAL HIGH (ref 70–99)
Glucose-Capillary: 191 mg/dL — ABNORMAL HIGH (ref 70–99)
Glucose-Capillary: 220 mg/dL — ABNORMAL HIGH (ref 70–99)

## 2021-08-11 SURGERY — CREATION, TRACHEOSTOMY
Anesthesia: General | Site: Neck

## 2021-08-11 MED ORDER — FENTANYL CITRATE (PF) 250 MCG/5ML IJ SOLN
INTRAMUSCULAR | Status: DC | PRN
  Administered 2021-08-11: 50 ug via INTRAVENOUS

## 2021-08-11 MED ORDER — ROCURONIUM BROMIDE 10 MG/ML (PF) SYRINGE
PREFILLED_SYRINGE | INTRAVENOUS | Status: AC
  Filled 2021-08-11: qty 50

## 2021-08-11 MED ORDER — SODIUM CHLORIDE 0.9 % IV SOLN
2.0000 g | Freq: Three times a day (TID) | INTRAVENOUS | Status: AC
  Administered 2021-08-11 – 2021-08-16 (×15): 2 g via INTRAVENOUS
  Filled 2021-08-11 (×15): qty 12.5

## 2021-08-11 MED ORDER — DEXAMETHASONE SODIUM PHOSPHATE 10 MG/ML IJ SOLN
INTRAMUSCULAR | Status: DC | PRN
  Administered 2021-08-11: 4 mg via INTRAVENOUS

## 2021-08-11 MED ORDER — MIDAZOLAM HCL 2 MG/2ML IJ SOLN
INTRAMUSCULAR | Status: DC | PRN
  Administered 2021-08-11: 2 mg via INTRAVENOUS

## 2021-08-11 MED ORDER — PHENYLEPHRINE HCL-NACL 20-0.9 MG/250ML-% IV SOLN
INTRAVENOUS | Status: DC | PRN
  Administered 2021-08-11: 50 ug/min via INTRAVENOUS

## 2021-08-11 MED ORDER — LACTATED RINGERS IV SOLN: INTRAVENOUS | Status: DC | PRN

## 2021-08-11 MED ORDER — DEXAMETHASONE SODIUM PHOSPHATE 10 MG/ML IJ SOLN
INTRAMUSCULAR | Status: AC
  Filled 2021-08-11: qty 4

## 2021-08-11 MED ORDER — ONDANSETRON HCL 4 MG/2ML IJ SOLN
INTRAMUSCULAR | Status: AC
  Filled 2021-08-11: qty 2

## 2021-08-11 MED ORDER — PHENYLEPHRINE 80 MCG/ML (10ML) SYRINGE FOR IV PUSH (FOR BLOOD PRESSURE SUPPORT)
PREFILLED_SYRINGE | INTRAVENOUS | Status: AC
  Filled 2021-08-11: qty 40

## 2021-08-11 MED ORDER — ROCURONIUM BROMIDE 10 MG/ML (PF) SYRINGE
PREFILLED_SYRINGE | INTRAVENOUS | Status: DC | PRN
  Administered 2021-08-11: 50 mg via INTRAVENOUS

## 2021-08-11 MED ORDER — LIDOCAINE-EPINEPHRINE 1 %-1:100000 IJ SOLN
INTRAMUSCULAR | Status: DC | PRN
  Administered 2021-08-11: 6 mL

## 2021-08-11 MED ORDER — PROPOFOL 10 MG/ML IV BOLUS
INTRAVENOUS | Status: AC
  Filled 2021-08-11: qty 20

## 2021-08-11 MED ORDER — 0.9 % SODIUM CHLORIDE (POUR BTL) OPTIME
TOPICAL | Status: DC | PRN
  Administered 2021-08-11: 1000 mL

## 2021-08-11 MED ORDER — ONDANSETRON HCL 4 MG/2ML IJ SOLN
INTRAMUSCULAR | Status: DC | PRN
  Administered 2021-08-11: 4 mg via INTRAVENOUS

## 2021-08-11 MED ORDER — EPHEDRINE 5 MG/ML INJ
INTRAVENOUS | Status: AC
  Filled 2021-08-11: qty 10

## 2021-08-11 MED ORDER — LIDOCAINE-EPINEPHRINE 1 %-1:100000 IJ SOLN
INTRAMUSCULAR | Status: AC
  Filled 2021-08-11: qty 1

## 2021-08-11 MED ORDER — FENTANYL CITRATE (PF) 250 MCG/5ML IJ SOLN
INTRAMUSCULAR | Status: AC
  Filled 2021-08-11: qty 5

## 2021-08-11 MED ORDER — MIDAZOLAM HCL 2 MG/2ML IJ SOLN
INTRAMUSCULAR | Status: AC
  Filled 2021-08-11: qty 2

## 2021-08-11 SURGICAL SUPPLY — 38 items
BLADE SURG 15 STRL LF DISP TIS (BLADE) IMPLANT
BLADE SURG 15 STRL SS (BLADE) ×2
CANISTER SUCT 3000ML PPV (MISCELLANEOUS) ×2 IMPLANT
CORD BIPOLAR FORCEPS 12FT (ELECTRODE) ×1 IMPLANT
COVER SURGICAL LIGHT HANDLE (MISCELLANEOUS) ×2 IMPLANT
ELECT COATED BLADE 2.86 ST (ELECTRODE) ×2 IMPLANT
ELECT REM PT RETURN 9FT ADLT (ELECTROSURGICAL) ×2
ELECTRODE REM PT RTRN 9FT ADLT (ELECTROSURGICAL) ×1 IMPLANT
FORCEPS BIPOLAR SPETZLER 8 1.0 (NEUROSURGERY SUPPLIES) ×1 IMPLANT
GAUZE 4X4 16PLY ~~LOC~~+RFID DBL (SPONGE) ×2 IMPLANT
GLOVE SURG ENC MOIS LTX SZ6.5 (GLOVE) ×1 IMPLANT
GOWN STRL REUS W/ TWL LRG LVL3 (GOWN DISPOSABLE) ×1 IMPLANT
GOWN STRL REUS W/TWL LRG LVL3 (GOWN DISPOSABLE) ×2
HOLDER TRACH TUBE VELCRO 19.5 (MISCELLANEOUS) ×1 IMPLANT
KIT BASIN OR (CUSTOM PROCEDURE TRAY) ×2 IMPLANT
KIT SUCTION CATH 14FR (SUCTIONS) IMPLANT
KIT TURNOVER KIT B (KITS) ×2 IMPLANT
NDL HYPO 25GX1X1/2 BEV (NEEDLE) ×1 IMPLANT
NEEDLE HYPO 25GX1X1/2 BEV (NEEDLE) ×2 IMPLANT
NS IRRIG 1000ML POUR BTL (IV SOLUTION) ×2 IMPLANT
PACK EENT II TURBAN DRAPE (CUSTOM PROCEDURE TRAY) ×2 IMPLANT
PAD ARMBOARD 7.5X6 YLW CONV (MISCELLANEOUS) ×4 IMPLANT
PENCIL SMOKE EVACUATOR (MISCELLANEOUS) ×2 IMPLANT
SPONGE DRAIN TRACH 4X4 STRL 2S (GAUZE/BANDAGES/DRESSINGS) ×2 IMPLANT
SPONGE INTESTINAL PEANUT (DISPOSABLE) ×2 IMPLANT
SUT CHROMIC 2 0 SH (SUTURE) ×2 IMPLANT
SUT ETHILON 2 0 FS 18 (SUTURE) ×3 IMPLANT
SUT PLAIN 4 0 FS 2 27 (SUTURE) ×1 IMPLANT
SUT SILK 2 0 PERMA HAND 18 BK (SUTURE) ×2 IMPLANT
SUT SILK 3 0 REEL (SUTURE) ×2 IMPLANT
SUT VIC AB 4-0 PS2 27 (SUTURE) ×1 IMPLANT
SYR 20ML LL LF (SYRINGE) ×2 IMPLANT
SYR BULB IRRIG 60ML STRL (SYRINGE) ×1 IMPLANT
SYR CONTROL 10ML LL (SYRINGE) ×2 IMPLANT
TUBE CONNECTING 12X1/4 (SUCTIONS) ×2 IMPLANT
TUBE TRACH SHILEY 6 76FEN UNCF (TUBING) IMPLANT
TUBE TRACH SHILEY 6 FEN UNCF (TUBING) ×2
WATER STERILE IRR 1000ML POUR (IV SOLUTION) ×2 IMPLANT

## 2021-08-11 NOTE — Interval H&P Note (Signed)
History and Physical Interval Note:  08/11/2021 1:43 PM  Joshua Donovan  has presented today for surgery, with the diagnosis of Tracheostomy complication.  The various methods of treatment have been discussed with the patient and family. After consideration of risks, benefits and other options for treatment, the patient has consented to  Tracheostomy revision as a surgical intervention.  The patient's history has been reviewed, patient examined, no change in status, stable for surgery.  I have reviewed the patient's chart and labs.  Questions were answered to the patient's satisfaction.     [name] A Thomasena Vandenheuvel

## 2021-08-11 NOTE — Progress Notes (Signed)
Physical Therapy Treatment Patient Details Name: Joshua Donovan MRN: [id number] DOB: [birth year] Today's Date: 08/11/2021   History of Present Illness Pt is a 66 y.o. male who presented 08/02/21 with R foot pain and R-sided weakness. Transferred to [facility] 7/25. 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 S/p stent placement to the R vertebrobasilar junction 7/26, failed extubation. Trach and PEG placed 7/28. S/p bronchoscopic evaluation, oral reintubation and tracheostomy removal with stoma packing 7/31 due to trach site bleeding. PMH: DM, GERD, TBI with prior ICH, HLD, HTN    PT Comments    Pt continues to be flaccid throughout his trunk and limbs, but is able to activate his facial muscles. Pt was not as consistent in following cues to blink his eyes to communicate today, likely due to the medications. Provided TA x2 for rolling and transitioning supine <> sit EOB. Provided PROM to trunk and limbs to prevent contractures. Will continue to follow acutely. Current recommendations remain appropriate.     Recommendations for follow up therapy are one component of a multi-disciplinary discharge planning process, led by the attending physician.  Recommendations may be updated based on patient status, additional functional criteria and insurance authorization.  Follow Up Recommendations  Skilled nursing-short term rehab (<3 hours/day) (vs LTACH) Can patient physically be transported by private vehicle: No   Assistance Recommended at Discharge Frequent or constant Supervision/Assistance  Patient can return home with the following Assistance  with cooking/housework;Two people to help with walking and/or transfers;Two people to help with bathing/dressing/bathroom;Assistance with feeding;Direct supervision/assist for medications management;Direct supervision/assist for financial management;Assist for transportation;Help with stairs or ramp for entrance   Equipment Recommendations  Other (comment) (TBD)    Recommendations for Other Services       Precautions / Restrictions Precautions Precautions: Fall;Other (comment) Precaution Comments: ETT, peg, SBP < 180, prevalon boots Restrictions Weight Bearing Restrictions: No     Mobility  Bed Mobility Overal bed mobility: Needs Assistance Bed Mobility: Supine to Sit, Sit to Supine, Rolling Rolling: Total assist, +2 for physical assistance, +2 for safety/equipment   Supine to sit: Total assist, +2 for physical assistance, +2 for safety/equipment, HOB elevated Sit to supine: Total assist, +2 for safety/equipment, +2 for physical assistance   General bed mobility comments: TAx2 for all bed mobility aspects as pt was unable to activately move any parts of his body except his face.    Transfers                   General transfer comment: deferred due to pt flaccidity    Ambulation/Gait               General Gait Details: deferred due to pt flaccidity   Stairs             Wheelchair Mobility    Modified Rankin (Stroke Patients Only) Modified Rankin (Stroke Patients Only) Pre-Morbid Rankin Score: No symptoms Modified Rankin: Severe disability     Balance Overall balance assessment: Needs assistance Sitting-balance support: Single extremity supported, No upper extremity supported, Feet supported, Bilateral upper extremity supported Sitting balance-Leahy Scale: Zero Sitting balance - Comments: totalA to sit with assist at trunk and head to maintain  midline, bringing pt to leaning laterally on either elbow 1x for UE weight bearing.       Standing  balance comment: deferred due to pt flaccidity                            Cognition Arousal/Alertness: Awake/alert Behavior During Therapy: Flat affect Overall Cognitive Status: Impaired/Different from baseline Area of Impairment: Memory, Following commands                     Memory: Decreased short-term memory Following Commands: Follows one step commands with increased time, Follows one step commands inconsistently       General Comments: Pt needing increased time and repeated cues to blink to respond to yes/no questions, likely due to medications today.        Exercises General Exercises - Upper Extremity Shoulder Flexion: PROM, Both, 10 reps, Supine Shoulder ABduction: PROM, Both, 10 reps, Supine Shoulder Horizontal ABduction: PROM, Both, 10 reps, Supine Shoulder Horizontal ADduction: PROM, Both, 10 reps, Supine Elbow Flexion: PROM, Both, 10 reps, Supine Elbow Extension: PROM, Both, 10 reps, Supine Wrist Flexion: PROM, Both, 10 reps, Supine Wrist Extension: PROM, Both, 10 reps, Supine Digit Composite Flexion: PROM, Both, 10 reps, Supine Composite Extension: PROM, Both, 10 reps, Supine General Exercises - Lower Extremity Ankle Circles/Pumps: PROM, Both, 10 reps, Supine Heel Slides: PROM, Both, 10 reps, Supine (with knee extension PROM) Hip ABduction/ADduction: PROM, Right, 10 reps, Supine Other Exercises Other Exercises: PROM bil forearm supination/prontation 10x supine Other Exercises: PROM trunk rotation 2x each direction sitting EOB TAx2 Other Exercises: Weight bearing through each elbow sitting EOB 1x, TAx2 Other Exercises: gentle cervical stretches    General Comments General comments (skin integrity, edema, etc.): SpO2 >/= 92% on vent 40% FiO2 PEEP 5; educated daughter on safe and gentle ROM to his limbs while avoiding positions that could cause harm or interfere with lines/leads      Pertinent Vitals/Pain Pain Assessment Pain Assessment:  Faces Faces Pain Scale: Hurts little more Pain Location: did not indicate Pain Descriptors / Indicators: Discomfort Pain Intervention(s): Limited activity within patient's tolerance, Monitored during session, Repositioned, Patient requesting pain meds-RN notified    Home Living                          Prior Function            PT Goals (current goals can now be found in the care plan section) Acute Rehab PT Goals Patient Stated Goal: to go to AIR per daughter PT Goal Formulation: With family Time For Goal Achievement: 08/19/21 Potential to Achieve Goals: Fair Progress towards PT goals: Not progressing toward goals - comment (limited by flaccidity)    Frequency    Min 3X/week      PT Plan Current plan remains appropriate    Co-evaluation PT/OT/SLP Co-Evaluation/Treatment: Yes Reason for Co-Treatment: For patient/therapist safety;To address functional/ADL transfers PT goals addressed during session: Mobility/safety with mobility;Balance;Strengthening/ROM        AM-PAC PT "6 Clicks" Mobility   Outcome Measure  Help needed turning from your back to your side while in a flat bed without using bedrails?: Total Help needed moving from lying on your back to sitting on the side of a flat bed without using bedrails?: Total Help needed moving to and from a bed to a chair (including a wheelchair)?: Total Help needed standing up from a chair using  your arms (e.g., wheelchair or bedside chair)?: Total Help needed to walk in hospital room?: Total Help needed climbing 3-5 steps with a railing? : Total 6 Click Score: 6    End of Session Equipment Utilized During Treatment: Oxygen (vent) Activity Tolerance: Patient tolerated treatment well Patient left: in bed;with call bell/phone within reach;with bed alarm set;with SCD's reapplied Nurse Communication: Patient requests pain meds PT Visit Diagnosis: Muscle weakness (generalized) (M62.81);Difficulty in walking, not  elsewhere classified (R26.2);Other symptoms and signs involving the nervous system (R29.898);Unsteadiness on feet (R26.81)     Time: 9244-6286 PT Time Calculation (min) (ACUTE ONLY): 39 min  Charges:  $Therapeutic Exercise: 8-22 mins $Therapeutic Activity: 8-22 mins                     Moishe Spice, PT, DPT Acute Rehabilitation Services  Office: [phone]    Orvan Falconer 08/11/2021, 12:47 PM

## 2021-08-11 NOTE — Progress Notes (Signed)
   08/11/21 1510  Clinical Encounter Type  Visited With Patient not available  Visit Type Initial  Referral From Nurse  Consult/Referral To Chaplain   Chaplain responded to a spiritual consult for prayer. The patient, Joshua Donovan, was enroute to surgery as I arrived.   Danice Goltz [facility]  [phone]

## 2021-08-11 NOTE — Transfer of Care (Signed)
Immediate Anesthesia Transfer of Care Note  Patient: Joshua Donovan  Procedure(s) Performed: TRACHEOSTOMY (Neck)  Patient Location: ICU  Anesthesia Type:General  Level of Consciousness: Patient remains intubated per anesthesia plan  Airway & Oxygen Therapy: Patient remains intubated per anesthesia plan and Patient placed on Ventilator (see vital sign flow sheet for setting)  Post-op Assessment: Report given to RN and Post -op Vital signs reviewed and stable  Post vital signs: Reviewed and stable  Last Vitals:  Vitals Value Taken Time  BP 153/86 08/11/21 1631  Temp 36.6 C 08/11/21 1631  Pulse 65 08/11/21 1640  Resp 18 08/11/21 1640  SpO2 97 % 08/11/21 1640  Vitals shown include unvalidated device data.  Last Pain:  Vitals:   08/11/21 1631  TempSrc: Axillary  PainSc:          Complications: No notable events documented.

## 2021-08-11 NOTE — Progress Notes (Signed)
NAME:  Joshua Donovan, MRN:  [id number], DOB:  10-20-1955, LOS: 3 ADMISSION DATE:  08/03/2021, CONSULTATION DATE:  08/04/21 REFERRING MD:  Dr. Gerhard Perches, CHIEF COMPLAINT:  CVA   History of Present Illness:  66 year old man who presented to [facility] 7/24 with right hand numbness, right LE pain and weakness.  PMHx significant for HTN, TBI with prior ICH, DM, and HLD.  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 Patient's weakness worsened to include right arm, right leg and left leg.  CTA Head/Neck revealed multiple severe intracranial stenoses, near-occlusive stenosis of the vertebrobasilar junction, occlusion of the distal left V4, moderate mid basilar stenosis, nearly occlusive bilateral PCA P2 segments, nearly occlusive distal right ACA A2 segments and bulky soft plaque or thrombus in the supraclinoid left ICA with mild to moderate right M1 stenosis.    Subsequently transferred to [facility] for further stroke care/Neuro IR evaluation 7/25.  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  Additionally, occlusion of left V4 distal to origin of PICA and patent bilateral P-comm arteries noted.  Heparin gtt and Plavix initiated.  Remains on Cleviprex gtt for strict SBP parameters.  Patient left intubated.  PCCM consulted for further ventilator management.  Patient underwent percutaneous tracheostomy placement 7/28 c/b bleeding (7/31) requiring bronchoscopic evaluation, oral reintubation and tracheostomy removal with stoma packing. ENT consulted.  Pertinent Medical History:  TBI with ICH, DM, HTN, HLD  Significant Hospital Events: Including procedures, antibiotic start and stop dates in addition to other pertinent events   7/24 presented to [facility], ventral medulla CVA 7/25 tx to [facility] 7/26 cerebral angiogram with stent placement to right vertebro-basilar junction,  failed extubation, left radial aline MRI brain> mild extension of stroke, now involving bilateral medial medullary, patent basilar artery and R VBJ stent  7/28 underwent bedside percutaneous tracheostomy and PEG tube placement, tolerated well 7/29 no acute issues overnight currently on SBT trial this a.m. and tolerating well, Cleviprex resumed earlier this a.m. due to severe hypertension 7/30 Hypertension persist despite adding oral agents, glucose remains elevated as well 7/31 Bleeding around trach site, bright red blood. Copious bloody secretions. Cleviprex off, SBPs 150s-160s. Basal insulin/TF coverage increased. Cefepime deescalated to ceftriaxone. CXR stable. Later in afternoon, continued peritrach bleeding. Bronchoscopic eval completed with intratracheal bleeding associated with trach. Removed, patient orally reintubated, stoma packed. 8/1 ENT consulted for trach revision. Lightly sedated. Vent full support/PRVC, given fatigue following events of yesterday. ENT attempted to evaluate trach at bedside, could not pass scope. Plan for OR 8/2. ASA/Brilinta held.   Interim History / Subjective:  No significant events overnight OR planned for this afternoon for trach redo  Objective:  Blood pressure 110/72, pulse 67, temperature 98.3 F (36.8 C), temperature source Axillary, resp. rate 18, height _0  (1.702 m), weight 84.4 kg, SpO2 99 %.    Vent Mode: PRVC FiO2 (%):  [40 %] 40 % Set Rate:  [18 bmp] 18 bmp Vt Set:  [520 mL] 520 mL PEEP:  [5 cmH20] 5 cmH20 Plateau Pressure:  [14 cmH20-17 cmH20] 15 cmH20   Intake/Output Summary (Last 24 hours) at 08/11/2021 0940 Last data filed at 08/11/2021 0800 Gross per 24 hour  Intake 2237.31 ml  Output 1550 ml  Net 687.31 ml    Filed Weights   08/04/21 0914 08/07/21 0500 08/08/21 1445  Weight: 83.9 kg 89.1 kg 84.4 kg   Physical Examination: General: Adult  male, resting in bed, in NAD. Neuro: Awake, tracks, doesn't follow commands. HEENT: [place]/AT.  Sclerae anicteric. ETT in place. Trach site dressings C/D/I. Cardiovascular: RRR, no M/R/G.  Lungs: Respirations even and unlabored.  CTA bilaterally, No W/R/R. Abdomen: BS x 4, soft, NT/ND.  Musculoskeletal: No gross deformities, no edema.  Skin: Intact, warm, no rashes.   Assessment & Plan:   Acute hypoxemic respiratory failure in the setting of stroke S/p percutaneous tracheostomy and PEG tube placement 7/28. Peritracheostomy bleeding resulting in trach removal 7/31 - reintubated from above with ETT Enterobacter pneumonia Increased tracheal secretions 7/28 resulting in BAL; empiric antibiotics started. Resp Cx with enterobacter aerogenes. Deescalated from cefepime to ceftriaxone. Trach with bleeding noted 7/31 requiring bronchoscopic evaluation, tracheal wall irritation noted and trach removed, reintubated oropharyngeallly. 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 SNF at discharge, pending respiratory/trach/vent  status  HTN emergency Grade 1 diastolic dysfunction seen on echocardiogram -TTE EF 60-65%, no WMA, G1DD, normal RV/ valves History of hyperlipidemia - SBP goal < 180 - Continue home lisinopril, Lopressor, amlodipine - ASA/Brilinta held for now - Continue statin  T2DM with hyperglycemia A1c 8.3 - Continue Levemir 16U BID - TF coverage 4U Q4H, room to increase if needed - SSI, resistant scale - CBGs Q4H, goal 140-180  ?Hx of ETOH abuse Reported 1 beer a day. - Monitor for signs/symptoms of withdrawal - Precedex for sedation, wean as able - CIWA - Continue thiamine, folate, MV  Best Practice (right click and "Reselect all SmartList Selections" daily)   Diet/type: NPO for OR, TF held DVT prophylaxis: prophylactic heparin  GI prophylaxis: PPI Lines: N/A Foley:  Yes, and it is no longer needed and removal ordered > frequent bladder scans to monitor for retention Code Status:  full code Last date of multidisciplinary goals of care discussion: Full code. Discussed with daughter at bedside 8/1AM.  Critical care time: 30 minutes    Montey Hora, [place] [facility] For pager details, please see AMION or use Epic chat  After 1900, please call [facility] for cross coverage needs 08/11/2021, 9:44 AM

## 2021-08-11 NOTE — Progress Notes (Signed)
Occupational Therapy Treatment Patient Details Name: Joshua Donovan MRN: [id number] DOB: Aug 19, 1955 Today's Date: 08/11/2021   History of present illness Pt is a 66 y.o. male who presented 08/02/21 with R foot pain and R-sided weakness. Transferred to [facility] 7/25. 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 S/p stent placement to the R vertebrobasilar junction 7/26, failed extubation. Trach and PEG placed 7/28. S/p bronchoscopic evaluation, oral reintubation and tracheostomy removal with stoma packing 7/31 due to trach site bleeding. PMH: DM, GERD, TBI with prior ICH, HLD, HTN   OT comments  Pt seen with pt for safe EOB progression. Pt continues to be locked in and required total A +2 for all aspect of care. Flaccid throughout, to activation noted. Completed PROM for BUEs, trunk rotation PROM, and elbow propping exercises in sitting, pt tolerated well. Pt accurately responded to yes/no questions with eye blink throughout when given increased time. He continues to benefit for family education and adapted techniques.POC remains the same.    Recommendations for follow up therapy are one component of a multi-disciplinary discharge planning process, led by the attending physician.  Recommendations may be updated based on patient status, additional functional criteria and insurance authorization.    Follow Up Recommendations  OT at [facility] acute [facility] hospital    Assistance Recommended at Discharge Frequent or constant Supervision/Assistance  Patient can return home with the following  Two people to help with walking and/or transfers;Two people  to help with bathing/dressing/bathroom;Assistance with cooking/housework;Assistance with feeding;Direct supervision/assist for medications management;Direct supervision/assist for financial management;Assist for transportation;Help with stairs or ramp for entrance   Equipment Recommendations  Other (comment)    Recommendations for Other Services      Precautions / Restrictions Precautions Precautions: Fall;Other (comment) Precaution Comments: ETT, peg, SBP < 180, prevalon boots Restrictions Weight Bearing Restrictions: No       Mobility Bed Mobility Overal bed mobility: Needs Assistance Bed Mobility: Supine to Sit, Sit to Supine, Rolling Rolling: Total assist, +2 for physical assistance, +2 for safety/equipment   Supine to sit: Total assist, +2 for physical assistance, +2 for safety/equipment, HOB elevated Sit to supine: Total assist, +2 for safety/equipment, +2 for physical assistance   General bed mobility comments: TAx2 for all bed mobility aspects as pt was unable to activately move any parts of his body except his face.    Transfers                   General transfer comment: deferred due to pt flaccidity     Balance Overall balance assessment: Needs assistance Sitting-balance support: Single extremity supported, No upper extremity supported, Feet supported, Bilateral upper extremity supported Sitting balance-Leahy Scale: Zero Sitting balance - Comments: totalA to sit with assist at trunk and head to maintain midline, bringing pt to leaning laterally on either elbow 1x for UE weight bearing.       Standing balance comment: deferred due to pt flaccidity                           ADL either performed or assessed with clinical judgement   ADL Overall ADL's : Needs assistance/impaired  General ADL Comments: total A for all aspects of his care    Extremity/Trunk Assessment Upper Extremity  Assessment Upper Extremity Assessment: RUE deficits/detail;LUE deficits/detail RUE Deficits / Details: No AROM noted.  PROM intact. RUE Coordination: decreased gross motor;decreased fine motor LUE Deficits / Details: No AROM noted.  PROM WFL LUE Coordination: decreased fine motor;decreased gross motor   Lower Extremity Assessment Lower Extremity Assessment: Defer to PT evaluation        Vision   Vision Assessment?: Yes Eye Alignment: Within Functional Limits Ocular Range of Motion: Within Functional Limits Alignment/Gaze Preference: Gaze left Tracking/Visual Pursuits: Decreased smoothness of horizontal tracking;Decreased smoothness of eye movement to LEFT inferior field;Decreased smoothness of eye movement to RIGHT inferior field;Decreased smoothness of eye movement to LEFT superior field;Decreased smoothness of eye movement to RIGHT superior field;Impaired - to be further tested in functional context;Other (comment) Visual Fields: No apparent deficits Additional Comments: blinks 2x for yes and 1x for no   Perception Perception Perception: Not tested   Praxis Praxis Praxis: Not tested    Cognition Arousal/Alertness: Awake/alert Behavior During Therapy: Flat affect Overall Cognitive Status: Impaired/Different from baseline Area of Impairment: Memory, Following commands                     Memory: Decreased short-term memory Following Commands: Follows one step commands with increased time, Follows one step commands inconsistently       General Comments: pt with no attempt at verbalizations, grunting, or making sounds. Able to blink consistently, but at times needs repeated cues to use 1 blink for no and 2 for yes, possibly imparied STM.        Exercises Exercises: General Upper Extremity, General Lower Extremity, Other exercises General Exercises - Upper Extremity Shoulder Flexion: PROM, Both, 10 reps, Supine Shoulder ABduction: PROM, Both, 10 reps,  Supine Shoulder Horizontal ABduction: PROM, Both, 10 reps, Supine Shoulder Horizontal ADduction: PROM, Both, 10 reps, Supine Elbow Flexion: PROM, Both, 10 reps, Supine Elbow Extension: PROM, Both, 10 reps, Supine Wrist Flexion: PROM, Both, 10 reps, Supine Wrist Extension: PROM, Both, 10 reps, Supine Digit Composite Flexion: PROM, Both, 10 reps, Supine Composite Extension: PROM, Both, 10 reps, Supine General Exercises - Lower Extremity Ankle Circles/Pumps: PROM, Both, 10 reps, Supine Heel Slides: PROM, Both, 10 reps, Supine (with knee extension PROM) Hip ABduction/ADduction: PROM, Right, 10 reps, Supine Other Exercises Other Exercises: PROM bil forearm supination/prontation 10x supine Other Exercises: PROM trunk rotation 2x each direction sitting EOB TAx2 Other Exercises: Weight bearing through each elbow sitting EOB 1x, TAx2 Other Exercises: gentle cervical stretches    Shoulder Instructions       General Comments SpO2 >/= 92% on vent 40% FiO2 PEEP 5; educated daughter on safe and gentle ROM to his limbs while avoiding positions that could cause harm or interfere with lines/leads    Pertinent Vitals/ Pain       Pain Assessment Pain Assessment: Faces Faces Pain Scale: Hurts little more Pain Location: did not indicate Pain Descriptors / Indicators: Discomfort Pain Intervention(s): Limited activity within patient's tolerance, Monitored during session  Home Living                                          Prior Functioning/Environment              Frequency  Min 2X/week        Progress  Toward Goals  OT Goals(current goals can now be found in the care plan section)  Progress towards OT goals: Not progressing toward goals - comment;Progressing toward goals  Acute Rehab OT Goals Patient Stated Goal: unable OT Goal Formulation: With patient/family Time For Goal Achievement: 08/19/21 Potential to Achieve Goals: Fair ADL Goals Pt/caregiver will  Perform Home Exercise Program: Both right and left upper extremity;With written HEP provided Additional ADL Goal #1: Pt's family will be educated about bed positioning to avoid pressure sores and increase care of skin Additional ADL Goal #2: Pt will use soft touch call bell to call nurses by moving head with min assist. Additional ADL Goal #3: Therapist to explore other options for call bell use if pt unable to move head to use soft touch system.  Plan Discharge plan remains appropriate    Co-evaluation    PT/OT/SLP Co-Evaluation/Treatment: Yes Reason for Co-Treatment: Complexity of the patient's impairments (multi-system involvement);For patient/therapist safety;To address functional/ADL transfers PT goals addressed during session: Mobility/safety with mobility;Balance;Strengthening/ROM OT goals addressed during session: ADL's and self-care      AM-PAC OT "6 Clicks" Daily Activity     Outcome Measure   Help from another person eating meals?: Total Help from another person taking care of personal grooming?: Total Help from another person toileting, which includes using toliet, bedpan, or urinal?: Total Help from another person bathing (including washing, rinsing, drying)?: Total Help from another person to put on and taking off regular upper body clothing?: Total Help from another person to put on and taking off regular lower body clothing?: Total 6 Click Score: 6    End of Session    OT Visit Diagnosis: Other symptoms and signs involving the nervous system (R29.898)   Activity Tolerance Patient tolerated treatment well   Patient Left in bed;with call bell/phone within reach;with family/visitor present   Nurse Communication Mobility status;Patient requests pain meds        Time: 7169-6789 OT Time Calculation (min): 32 min  Charges: OT General Charges $OT Visit: 1 Visit OT Treatments $Therapeutic Activity: 8-22 mins    Annessa Satre A Marion Seese 08/11/2021, 2:54 PM

## 2021-08-11 NOTE — Progress Notes (Addendum)
STROKE TEAM PROGRESS NOTE   SUBJECTIVE (INTERVAL HISTORY)  On fentanyl and precedex. Neuro status unchanged. Remains quadriplegic and will open eyes to voice and follow commands. Plan for tracheostomy revision in OR today.  Neurological exam unchanged.  Vital signs stable. OBJECTIVE Temp:  [97.7 F (36.5 C)-98.9 F (37.2 C)] 98.3 F (36.8 C) (08/02 1200) Pulse Rate:  [61-69] 61 (08/02 1400) Cardiac Rhythm: Normal sinus rhythm (08/02 0800) Resp:  [17-19] 18 (08/02 1400) BP: (105-145)/(67-87) 140/79 (08/02 1400) SpO2:  [97 %-100 %] 98 % (08/02 1400) FiO2 (%):  [40 %] 40 % (08/02 1141)  Recent Labs  Lab 08/10/21 1939 08/10/21 2312 08/11/21 0322 08/11/21 0750 08/11/21 1119  GLUCAP 204* 232* 202* 96 83   Recent Labs  Lab 08/05/21 1619 08/06/21 0512 08/06/21 1812 08/07/21 0225 08/08/21 0227 08/09/21 0157 08/10/21 0623  NA  --  139  --  139 141 143 143  K  --  4.3  --  3.5 3.8 3.4* 4.0  CL  --  106  --  104 106 105 108  CO2  --  22  --  '26 25 29 30  '$ GLUCOSE  --  216*  --  290* 258* 212* 258*  BUN  --  11  --  '12 14 15 23  '$ CREATININE  --  0.77  --  0.72 0.69 0.70 0.73  CALCIUM  --  8.2*  --  8.7* 8.6* 9.2 9.0  MG 3.0* 2.6* 2.1 2.0  --   --   --   PHOS 2.9 3.3 2.9 3.2  --   --   --    Recent Labs  Lab 08/10/21 0623  AST 30  ALT 50*  ALKPHOS 30*  BILITOT 0.5  PROT 6.0*  ALBUMIN 2.5*   Recent Labs  Lab 08/05/21 0531 08/06/21 0512 08/07/21 0225 08/09/21 0157 08/10/21 0623  WBC 12.5* 9.0 8.9 9.6 7.3  NEUTROABS  --   --   --   --  5.0  HGB 13.6 12.6* 13.2 13.3 12.0*  HCT 40.3 38.5* 40.4 41.3 37.7*  MCV 87.4 89.1 89.2 89.4 91.5  PLT 303 270 291 343 325   No results for input(s): "CKTOTAL", "CKMB", "CKMBINDEX", "TROPONINI" in the last 168 hours. No results for input(s): "LABPROT", "INR" in the last 72 hours. No results for input(s): "COLORURINE", "LABSPEC", "PHURINE", "GLUCOSEU", "HGBUR", "BILIRUBINUR", "KETONESUR", "PROTEINUR", "UROBILINOGEN", "NITRITE",  "LEUKOCYTESUR" in the last 72 hours.  Invalid input(s): "APPERANCEUR"     Component Value Date/Time   CHOL 203 (H) 08/03/2021 0347   TRIG 175 (H) 08/03/2021 0347   HDL 52 08/03/2021 0347   CHOLHDL 3.9 08/03/2021 0347   VLDL 35 08/03/2021 0347   LDLCALC 116 (H) 08/03/2021 0347   Lab Results  Component Value Date   HGBA1C 8.3 (H) 08/03/2021   No results found for: "LABOPIA", "COCAINSCRNUR", "LABBENZ", "AMPHETMU", "THCU", "LABBARB"  No results for input(s): "ETH" in the last 168 hours.  I have personally reviewed the radiological images below and agree with the radiology interpretations.  DG CHEST PORT 1 VIEW  Result Date: 08/09/2021 CLINICAL DATA:  Intubation. EXAM: PORTABLE CHEST 1 VIEW COMPARISON:  Same day. FINDINGS: The heart size and mediastinal contours are within normal limits. Endotracheal tube is noted in grossly good position. Minimal bibasilar subsegmental atelectasis is noted. The visualized skeletal structures are unremarkable. IMPRESSION: Endotracheal tube in grossly good position. Electronically Signed   By: Marijo Conception M.D.   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 He presented with progressively worsening weakness in the right lower extremity, left lower extremity, right upper extremity, and left upper extremity. On the morning of the procedure, patient was flaccid at the right-sided extremities and had severe weakness in the left-sided extremities (grade 1/5). 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  There was occlusion of the left vertebral artery seen distal to the origin of the PICA. 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 EXAM: 1.  Biplane right carotid and cerebral DSA angiogram. 2.  Biplane left common carotid and cerebral DSA cerebral angiogram 3. Indirect left vertebral and cerebral angiogram with tip of the catheter at the origin of the left vertebral artery 4.  Right subclavian arteriogram. 5. Selective catheterization of the right vertebral artery, biplane vertebral and cerebral DSA cerebral angiogram. 6. Selective catheterization of the right vertebral artery by using the 088 neuron max guide catheter, 5 French AXS catalyst catheter as an intermediate catheter and 021 phenom microcatheter with a 014 Arostotle microwire. 7. High magnification biplane cerebral angiogram and exchange of the microcatheter over an exchange length 014 zoom wire into an Apex 2.25 x 15 mm monorail balloon catheter and angioplasty of the severely 90% stenotic right vertebrobasilar junction. 8. Resolute onyx 3 mm x 22 mm stent placement at the site of the stenosis. 9. Post stent biplane DSA cerebral angiography of the right vertebral artery injection. 10. Right common femoral arteriogram in the RAO projection after withdrawing the sheath with its tip in the external iliac artery and arteriogram Attending: Dr. Frazier Richards Assistant: Dr. Luanne Bras COMPARISON:  CORRELATION IS MADE WITH CTA OF THE HEAD AND NECK DATED August 03, 2021 MEDICATIONS: The antibiotic was administered within 1 hour of the procedure Injection Ancef 2 g Injection Integrilin 4.5 mg at 1.5 mg increments intra-arterially through the right vertebral artery Heparin 3500 units ANESTHESIA/SEDATION: Anesthesia was provided by the anesthesia team. Please refer to the anesthesia notes for detailed information. CONTRAST:  126 mL of Omnipaque 300 FLUOROSCOPY: Fluoroscopy Time: 48 minutes 48 seconds  (3499 mGy). COMPLICATIONS: None immediate. TECHNIQUE: Informed written consent was obtained from the patient and his daughter after a thorough discussion of the procedural risks including but not limited to approximately 5% bleeding, hematoma, vascular injury, worsening of the stroke, ventilator dependency etc. Benefits and alternatives. All questions were addressed. Maximal Sterile Barrier Technique was utilized including caps, mask, sterile gowns, sterile gloves, sterile drape, hand hygiene and skin antiseptic. A timeout was performed prior to the initiation of the procedure. PROCEDURE: The right groin was prepped and draped in the usual sterile fashion. Thereafter using modified Seldinger technique, transfemoral access into the right common femoral artery was obtained without difficulty. Over a 0.035 inch guidewire, an 8 French x 25 cm pinnacle sheath was inserted. Through this, and also over 0.035 inch roadrunner wire, a 5 [place] JB 1 catheter was advanced to the aortic arch region and selectively positioned in the right common carotid artery, and biplane DSA carotid and cerebral angiogram was obtained. Next, with the catheter tip in the left  common carotid artery, biplane DSA carotid and cerebral Angiography of the head was obtained. The, selective catheterization of the left subclavian artery followed by angiogram was performed. With the tip of the catheter at the origin of the left vertebral artery, indirect left vertebral and biplane DSA cerebral angiography of left subclavian arterial injection was performed. Next, selective catheterization of the right subclavian artery was performed and the catheter was advanced into the proximal subclavian artery. Next, the wire was exchanged for an exchange length 035 Rosen wire. The JB 1 catheter was removed and 088 neuron max guide catheter and 5 French AXS catalyst catheter were advanced and selective catheterization of the left vertebral artery followed by vertebral  and cerebral angiogram was performed. Next, under high magnification angiography, and under roadmap guidance, phenom 21 microcatheter and 0 1 for restarting microwire were advanced into the left posterior cerebral artery and angiogram was performed. Next, the wire was exchanged for a 014 zoom exchange length wire. Subsequently, we selected 2.25 x 15 mm Apex monorail microcatheter and under roadmap guidance, angioplasty of the severely stenotic right vertebrobasilar artery was performed. Post angioplasty angiogram was performed. Next, the balloon was removed. Measurements were made. Next we selected 3 mm x 22 mm resolute onyx stent and the stent was deployed at the right vertebrobasilar stenotic site. The stent was gently dilated and was inflated up to 10 atmospheric pressure. Next, post stent biplane DSA cerebral angiogram was performed. At this point in time, we felt the goal of the procedure was obtained. The catheter was removed. Femoral access site was further sterilely cleaned and the femoral sheath was withdrawn with its tip in the right external iliac artery, femoral angiogram was performed in the RAO projection and the femoral access site was closed by using an 8 [place] Angio-Seal. Patient tolerated the procedure well and was transferred to the ICU intubated in stable condition. No immediate complications. FINDINGS: Right CCA: The right common carotid arteriogram demonstrates the right external carotid artery and its major branches to be widely patent. Right ICA: The right internal carotid artery at the bulb to the cranial skull base opacifies normally. No significant stenosis. Right Intracranial: The petrous and cavernous segments are widely patent. Mild 20-30% stenosis at the supraclinoid ICA. The right middle cerebral artery and the right anterior cerebral artery opacify normally into the capillary and venous phases. Mild-to-moderate atheromatous disease with about 30% stenosis in the right M1 segment.  There is severe about 70% stenosis of the right pericallosal branch of the ACA seen in the proximal aspect. There is cross filling via the anterior communicating artery seen with opacification of the left anterior cerebral artery. PCOM is patent with faint opacification of the right posterior cerebral artery. Right Sided Anterior Venous: The venous phase demonstrates preferential egress of contrast into the right transverse sinus, the sigmoid sinus and the right internal jugular vein. There is no early venous shunting into the venous structures at the skull base or intracranially into the dural sinuses. Left CCA: The left common carotid arteriogram demonstrates the left external carotid artery and its major branches to be widely patent. Left ICA: The left internal carotid artery at the bulb to the cranial skull base opacifies normally. Mild atheromatous disease at the origin of the ICA. There is mild stenosis seen about 2 cm from the origin of the left ICA measuring 42 percent by the NASCET criteria. Left Intracranial: The petrous, cavernous and supraclinoid segments are widely patent. The left middle cerebral artery and the  left anterior cerebral artery opacify normally into the capillary and venous phases. There is a prominent PCOM opacifying the bilateral posterior cerebral arteries via the distal basilar artery. Left Sided Anterior Venous: The venous phase demonstrates preferential egress of contrast into the right transverse sinus, the sigmoid sinus and the right internal jugular vein. There is opacification of the left transverse sigmoid sinuses and internal jugular veins seen as well. There is no early venous shunting into the venous structures at the skull base or intracranially into the dural sinuses. Indirect left vertebral and cerebral angiogram with injection of the left subclavian artery demonstrate faint opacification of the left vertebral artery. Left vertebral artery occludes immediately distal to  the origin of the PICA. High magnification biplane DSA cerebral angiography of right vertebral artery injection demonstrate severe about 90% stenosis at the right vertebrobasilar junction in length of about 7 mm. Post angioplasty angiogram demonstrate significant residual stenoses. Post stent angiogram demonstrate complete expansion of the stent with no residual stenosis. There is about 40-50% stenosis seen at the mid basilar artery. Right PICA, bilateral superior cerebellar, AICA and posterior cerebral arteries show moderate atheromatous disease. No significant stenosis. IMPRESSION: 1. Right vertebral and cerebral angiogram demonstrated about 90% stenosis at the vertebrobasilar junction. Successful angioplasty and stent placement without significant residual stenosis. 2.  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 3. 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 4. Mild atheromatous disease in the right intracranial ICA with 20-30% stenosis at the supraclinoid ICA and 30% stenosis in the right M1 segment. There is about 70% stenoses of the right pericallosal branch of the ACA seen in the A3 segment. 5.  Bilateral P comms are patent with the left PCOM being prominent. PLAN: 1.  Blood pressure goals between 120-140 mm Hg. 2.  Patient to be on dual antiplatelet agents for 6 months. Electronically Signed   By: Frazier Richards M.D.   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 He presented with progressively worsening weakness in the right lower extremity, left lower extremity, right upper extremity, and left upper extremity. On the morning of the procedure, patient was flaccid at the  right-sided extremities and had severe weakness in the left-sided extremities (grade 1/5). 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 There was occlusion of the left vertebral artery seen distal to the origin of the PICA. 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 EXAM: 1.  Biplane right carotid and cerebral DSA angiogram. 2.  Biplane left common carotid and cerebral DSA cerebral angiogram 3. Indirect left vertebral and cerebral angiogram with tip of the catheter at the origin of the left vertebral artery 4.  Right subclavian arteriogram. 5. Selective catheterization of the right vertebral artery, biplane vertebral and cerebral DSA cerebral angiogram. 6. Selective catheterization of the right vertebral artery by using the 088 neuron max guide catheter, 5 French AXS catalyst catheter as an intermediate catheter and 021 phenom microcatheter with a 014 Arostotle microwire. 7. High magnification biplane cerebral angiogram and exchange of the microcatheter  over an exchange length 014 zoom wire into an Apex 2.25 x 15 mm monorail balloon catheter and angioplasty of the severely 90% stenotic right vertebrobasilar junction. 8. Resolute onyx 3 mm x 22 mm stent placement at the site of the stenosis. 9. Post stent biplane DSA cerebral angiography of the right vertebral artery injection. 10. Right common femoral arteriogram in the RAO projection after withdrawing the sheath with its tip in the external iliac artery and arteriogram Attending: Dr. Frazier Richards Assistant: Dr. Luanne Bras COMPARISON:  CORRELATION IS MADE WITH CTA OF THE HEAD AND NECK DATED August 03, 2021 MEDICATIONS: The antibiotic was administered within 1 hour of the procedure Injection Ancef 2 g Injection Integrilin 4.5 mg at 1.5 mg increments  intra-arterially through the right vertebral artery Heparin 3500 units ANESTHESIA/SEDATION: Anesthesia was provided by the anesthesia team. Please refer to the anesthesia notes for detailed information. CONTRAST:  126 mL of Omnipaque 300 FLUOROSCOPY: Fluoroscopy Time: 48 minutes 48 seconds (3499 mGy). COMPLICATIONS: None immediate. TECHNIQUE: Informed written consent was obtained from the patient and his daughter after a thorough discussion of the procedural risks including but not limited to approximately 5% bleeding, hematoma, vascular injury, worsening of the stroke, ventilator dependency etc. Benefits and alternatives. All questions were addressed. Maximal Sterile Barrier Technique was utilized including caps, mask, sterile gowns, sterile gloves, sterile drape, hand hygiene and skin antiseptic. A timeout was performed prior to the initiation of the procedure. PROCEDURE: The right groin was prepped and draped in the usual sterile fashion. Thereafter using modified Seldinger technique, transfemoral access into the right common femoral artery was obtained without difficulty. Over a 0.035 inch guidewire, an 8 French x 25 cm pinnacle sheath was inserted. Through this, and also over 0.035 inch roadrunner wire, a 5 [place] JB 1 catheter was advanced to the aortic arch region and selectively positioned in the right common carotid artery, and biplane DSA carotid and cerebral angiogram was obtained. Next, with the catheter tip in the left common carotid artery, biplane DSA carotid and cerebral Angiography of the head was obtained. The, selective catheterization of the left subclavian artery followed by angiogram was performed. With the tip of the catheter at the origin of the left vertebral artery, indirect left vertebral and biplane DSA cerebral angiography of left subclavian arterial injection was performed. Next, selective catheterization of the right subclavian artery was performed and the catheter was advanced into  the proximal subclavian artery. Next, the wire was exchanged for an exchange length 035 Rosen wire. The JB 1 catheter was removed and 088 neuron max guide catheter and 5 French AXS catalyst catheter were advanced and selective catheterization of the left vertebral artery followed by vertebral and cerebral angiogram was performed. Next, under high magnification angiography, and under roadmap guidance, phenom 21 microcatheter and 0 1 for restarting microwire were advanced into the left posterior cerebral artery and angiogram was performed. Next, the wire was exchanged for a 014 zoom exchange length wire. Subsequently, we selected 2.25 x 15 mm Apex monorail microcatheter and under roadmap guidance, angioplasty of the severely stenotic right vertebrobasilar artery was performed. Post angioplasty angiogram was performed. Next, the balloon was removed. Measurements were made. Next we selected 3 mm x 22 mm resolute onyx stent and the stent was deployed at the right vertebrobasilar stenotic site. The stent was gently dilated and was inflated up to 10 atmospheric pressure. Next, post stent biplane DSA cerebral angiogram was performed. At this point in time, we felt the goal of the  procedure was obtained. The catheter was removed. Femoral access site was further sterilely cleaned and the femoral sheath was withdrawn with its tip in the right external iliac artery, femoral angiogram was performed in the RAO projection and the femoral access site was closed by using an 8 [place] Angio-Seal. Patient tolerated the procedure well and was transferred to the ICU intubated in stable condition. No immediate complications. FINDINGS: Right CCA: The right common carotid arteriogram demonstrates the right external carotid artery and its major branches to be widely patent. Right ICA: The right internal carotid artery at the bulb to the cranial skull base opacifies normally. No significant stenosis. Right Intracranial: The petrous and  cavernous segments are widely patent. Mild 20-30% stenosis at the supraclinoid ICA. The right middle cerebral artery and the right anterior cerebral artery opacify normally into the capillary and venous phases. Mild-to-moderate atheromatous disease with about 30% stenosis in the right M1 segment. There is severe about 70% stenosis of the right pericallosal branch of the ACA seen in the proximal aspect. There is cross filling via the anterior communicating artery seen with opacification of the left anterior cerebral artery. PCOM is patent with faint opacification of the right posterior cerebral artery. Right Sided Anterior Venous: The venous phase demonstrates preferential egress of contrast into the right transverse sinus, the sigmoid sinus and the right internal jugular vein. There is no early venous shunting into the venous structures at the skull base or intracranially into the dural sinuses. Left CCA: The left common carotid arteriogram demonstrates the left external carotid artery and its major branches to be widely patent. Left ICA: The left internal carotid artery at the bulb to the cranial skull base opacifies normally. Mild atheromatous disease at the origin of the ICA. There is mild stenosis seen about 2 cm from the origin of the left ICA measuring 42 percent by the NASCET criteria. Left Intracranial: The petrous, cavernous and supraclinoid segments are widely patent. The left middle cerebral artery and the left anterior cerebral artery opacify normally into the capillary and venous phases. There is a prominent PCOM opacifying the bilateral posterior cerebral arteries via the distal basilar artery. Left Sided Anterior Venous: The venous phase demonstrates preferential egress of contrast into the right transverse sinus, the sigmoid sinus and the right internal jugular vein. There is opacification of the left transverse sigmoid sinuses and internal jugular veins seen as well. There is no early venous  shunting into the venous structures at the skull base or intracranially into the dural sinuses. Indirect left vertebral and cerebral angiogram with injection of the left subclavian artery demonstrate faint opacification of the left vertebral artery. 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 High magnification biplane DSA cerebral angiography of right vertebral artery injection demonstrate severe about 90% stenosis at the right vertebrobasilar junction in length of about 7 mm. Post angioplasty angiogram demonstrate significant residual stenoses. Post stent angiogram demonstrate complete expansion of the stent with no residual stenosis. There is about 40-50% stenosis seen at the mid basilar artery. Right PICA, bilateral superior cerebellar, AICA and posterior cerebral arteries show moderate atheromatous disease. No significant stenosis. IMPRESSION: 1. Right vertebral and cerebral angiogram demonstrated about 90% stenosis at the vertebrobasilar junction. Successful angioplasty and stent placement without significant residual stenosis. 2.  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 3. 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 4. Mild atheromatous disease in the right intracranial ICA with 20-30% stenosis  at the supraclinoid ICA and 30% stenosis in the right M1 segment. There is about 70% stenoses of the right pericallosal branch of the ACA seen in the A3 segment. 5.  Bilateral P comms are patent with the left PCOM being prominent. PLAN: 1.  Blood pressure goals between 120-140 mm Hg. 2.  Patient to be on dual antiplatelet agents for 6 months. Electronically Signed   By: Frazier Richards M.D.   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 He presented with progressively worsening weakness in the right lower extremity, left lower extremity, right upper extremity, and left upper extremity. On the morning of the procedure, patient was flaccid at the right-sided extremities and had severe weakness in the left-sided extremities (grade 1/5). 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 There was occlusion of the left vertebral artery seen distal to the origin of the PICA. 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 EXAM: 1.  Biplane right carotid and cerebral DSA angiogram. 2.  Biplane left common carotid and cerebral DSA cerebral angiogram 3. Indirect left vertebral and cerebral angiogram with tip of the catheter at the origin of the left vertebral artery 4.  Right subclavian arteriogram. 5. Selective catheterization of the right vertebral artery, biplane vertebral and cerebral DSA cerebral angiogram. 6. Selective catheterization of the right vertebral artery by using the 088 neuron max guide catheter, 5 French AXS catalyst catheter as an intermediate catheter and 021 phenom microcatheter with a 014 Arostotle microwire. 7. High magnification biplane cerebral angiogram and exchange of the microcatheter over an exchange length 014 zoom wire into an Apex 2.25 x 15 mm monorail balloon catheter and angioplasty of the severely 90% stenotic right vertebrobasilar junction. 8. Resolute onyx 3 mm x 22 mm stent placement at the site of the stenosis. 9. Post stent biplane DSA cerebral angiography of the right vertebral artery injection. 10. Right common femoral arteriogram in the RAO projection after withdrawing the sheath with its tip in the external iliac artery  and arteriogram Attending: Dr. Frazier Richards Assistant: Dr. Luanne Bras COMPARISON:  CORRELATION IS MADE WITH CTA OF THE HEAD AND NECK DATED August 03, 2021 MEDICATIONS: The antibiotic was administered within 1 hour of the procedure Injection Ancef 2 g Injection Integrilin 4.5 mg at 1.5 mg increments intra-arterially through the right vertebral artery Heparin 3500 units ANESTHESIA/SEDATION: Anesthesia was provided by the anesthesia team. Please refer to the anesthesia notes for detailed information. CONTRAST:  126 mL of Omnipaque 300 FLUOROSCOPY: Fluoroscopy Time: 48 minutes 48 seconds (3499 mGy). COMPLICATIONS: None immediate. TECHNIQUE: Informed written consent was obtained from the patient and his daughter after a thorough discussion of the procedural risks including but not limited to approximately 5% bleeding, hematoma, vascular injury, worsening of the stroke, ventilator dependency etc. Benefits and alternatives. All questions were addressed. Maximal Sterile Barrier Technique was utilized including caps, mask, sterile gowns, sterile gloves, sterile drape, hand hygiene and skin antiseptic. A timeout was performed prior to the initiation of the procedure. PROCEDURE: The right groin was prepped and draped in the  usual sterile fashion. Thereafter using modified Seldinger technique, transfemoral access into the right common femoral artery was obtained without difficulty. Over a 0.035 inch guidewire, an 8 French x 25 cm pinnacle sheath was inserted. Through this, and also over 0.035 inch roadrunner wire, a 5 [place] JB 1 catheter was advanced to the aortic arch region and selectively positioned in the right common carotid artery, and biplane DSA carotid and cerebral angiogram was obtained. Next, with the catheter tip in the left common carotid artery, biplane DSA carotid and cerebral Angiography of the head was obtained. The, selective catheterization of the left subclavian artery followed by angiogram was  performed. With the tip of the catheter at the origin of the left vertebral artery, indirect left vertebral and biplane DSA cerebral angiography of left subclavian arterial injection was performed. Next, selective catheterization of the right subclavian artery was performed and the catheter was advanced into the proximal subclavian artery. Next, the wire was exchanged for an exchange length 035 Rosen wire. The JB 1 catheter was removed and 088 neuron max guide catheter and 5 French AXS catalyst catheter were advanced and selective catheterization of the left vertebral artery followed by vertebral and cerebral angiogram was performed. Next, under high magnification angiography, and under roadmap guidance, phenom 21 microcatheter and 0 1 for restarting microwire were advanced into the left posterior cerebral artery and angiogram was performed. Next, the wire was exchanged for a 014 zoom exchange length wire. Subsequently, we selected 2.25 x 15 mm Apex monorail microcatheter and under roadmap guidance, angioplasty of the severely stenotic right vertebrobasilar artery was performed. Post angioplasty angiogram was performed. Next, the balloon was removed. Measurements were made. Next we selected 3 mm x 22 mm resolute onyx stent and the stent was deployed at the right vertebrobasilar stenotic site. The stent was gently dilated and was inflated up to 10 atmospheric pressure. Next, post stent biplane DSA cerebral angiogram was performed. At this point in time, we felt the goal of the procedure was obtained. The catheter was removed. Femoral access site was further sterilely cleaned and the femoral sheath was withdrawn with its tip in the right external iliac artery, femoral angiogram was performed in the RAO projection and the femoral access site was closed by using an 8 [place] Angio-Seal. Patient tolerated the procedure well and was transferred to the ICU intubated in stable condition. No immediate complications. FINDINGS:  Right CCA: The right common carotid arteriogram demonstrates the right external carotid artery and its major branches to be widely patent. Right ICA: The right internal carotid artery at the bulb to the cranial skull base opacifies normally. No significant stenosis. Right Intracranial: The petrous and cavernous segments are widely patent. Mild 20-30% stenosis at the supraclinoid ICA. The right middle cerebral artery and the right anterior cerebral artery opacify normally into the capillary and venous phases. Mild-to-moderate atheromatous disease with about 30% stenosis in the right M1 segment. There is severe about 70% stenosis of the right pericallosal branch of the ACA seen in the proximal aspect. There is cross filling via the anterior communicating artery seen with opacification of the left anterior cerebral artery. PCOM is patent with faint opacification of the right posterior cerebral artery. Right Sided Anterior Venous: The venous phase demonstrates preferential egress of contrast into the right transverse sinus, the sigmoid sinus and the right internal jugular vein. There is no early venous shunting into the venous structures at the skull base or intracranially into the dural sinuses. Left CCA: The left  common carotid arteriogram demonstrates the left external carotid artery and its major branches to be widely patent. Left ICA: The left internal carotid artery at the bulb to the cranial skull base opacifies normally. Mild atheromatous disease at the origin of the ICA. There is mild stenosis seen about 2 cm from the origin of the left ICA measuring 42 percent by the NASCET criteria. Left Intracranial: The petrous, cavernous and supraclinoid segments are widely patent. The left middle cerebral artery and the left anterior cerebral artery opacify normally into the capillary and venous phases. There is a prominent PCOM opacifying the bilateral posterior cerebral arteries via the distal basilar artery. Left Sided  Anterior Venous: The venous phase demonstrates preferential egress of contrast into the right transverse sinus, the sigmoid sinus and the right internal jugular vein. There is opacification of the left transverse sigmoid sinuses and internal jugular veins seen as well. There is no early venous shunting into the venous structures at the skull base or intracranially into the dural sinuses. Indirect left vertebral and cerebral angiogram with injection of the left subclavian artery demonstrate faint opacification of the left vertebral artery. 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 High magnification biplane DSA cerebral angiography of right vertebral artery injection demonstrate severe about 90% stenosis at the right vertebrobasilar junction in length of about 7 mm. Post angioplasty angiogram demonstrate significant residual stenoses. Post stent angiogram demonstrate complete expansion of the stent with no residual stenosis. There is about 40-50% stenosis seen at the mid basilar artery. Right PICA, bilateral superior cerebellar, AICA and posterior cerebral arteries show moderate atheromatous disease. No significant stenosis. IMPRESSION: 1. Right vertebral and cerebral angiogram demonstrated about 90% stenosis at the vertebrobasilar junction. Successful angioplasty and stent placement without significant residual stenosis. 2.  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 3. 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 4. Mild atheromatous disease in the right intracranial ICA with 20-30% stenosis at the supraclinoid ICA and 30% stenosis in the right M1 segment. There is about 70% stenoses of the right pericallosal branch of the ACA seen in the A3 segment. 5.  Bilateral P comms are patent with the left PCOM being prominent. PLAN: 1.  Blood pressure goals between 120-140 mm Hg. 2.  Patient to be on dual antiplatelet agents for 6 months. Electronically  Signed   By: Frazier Richards M.D.   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 He presented with progressively worsening weakness in the right lower extremity, left lower extremity, right upper extremity, and left upper extremity. On the morning of the procedure, patient was flaccid at the right-sided extremities and had severe weakness in the left-sided extremities (grade 1/5). 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 There was occlusion of the left vertebral artery seen distal to the origin of the PICA. 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 EXAM: 1.  Biplane right carotid and cerebral DSA angiogram. 2.  Biplane left common carotid and cerebral DSA cerebral angiogram 3. Indirect left vertebral and cerebral angiogram  with tip of the catheter at the origin of the left vertebral artery 4.  Right subclavian arteriogram. 5. Selective catheterization of the right vertebral artery, biplane vertebral and cerebral DSA cerebral angiogram. 6. Selective catheterization of the right vertebral artery by using the 088 neuron max guide catheter, 5 French AXS catalyst catheter as an intermediate catheter and 021 phenom microcatheter with a 014 Arostotle microwire. 7. High magnification biplane cerebral angiogram and exchange of the microcatheter over an exchange length 014 zoom wire into an Apex 2.25 x 15 mm monorail balloon catheter and angioplasty of the severely 90% stenotic right vertebrobasilar junction. 8. Resolute onyx 3 mm x 22 mm stent  placement at the site of the stenosis. 9. Post stent biplane DSA cerebral angiography of the right vertebral artery injection. 10. Right common femoral arteriogram in the RAO projection after withdrawing the sheath with its tip in the external iliac artery and arteriogram Attending: Dr. Frazier Richards Assistant: Dr. Luanne Bras COMPARISON:  CORRELATION IS MADE WITH CTA OF THE HEAD AND NECK DATED August 03, 2021 MEDICATIONS: The antibiotic was administered within 1 hour of the procedure Injection Ancef 2 g Injection Integrilin 4.5 mg at 1.5 mg increments intra-arterially through the right vertebral artery Heparin 3500 units ANESTHESIA/SEDATION: Anesthesia was provided by the anesthesia team. Please refer to the anesthesia notes for detailed information. CONTRAST:  126 mL of Omnipaque 300 FLUOROSCOPY: Fluoroscopy Time: 48 minutes 48 seconds (3499 mGy). COMPLICATIONS: None immediate. TECHNIQUE: Informed written consent was obtained from the patient and his daughter after a thorough discussion of the procedural risks including but not limited to approximately 5% bleeding, hematoma, vascular injury, worsening of the stroke, ventilator dependency etc. Benefits and alternatives. All questions were addressed. Maximal Sterile Barrier Technique was utilized including caps, mask, sterile gowns, sterile gloves, sterile drape, hand hygiene and skin antiseptic. A timeout was performed prior to the initiation of the procedure. PROCEDURE: The right groin was prepped and draped in the usual sterile fashion. Thereafter using modified Seldinger technique, transfemoral access into the right common femoral artery was obtained without difficulty. Over a 0.035 inch guidewire, an 8 French x 25 cm pinnacle sheath was inserted. Through this, and also over 0.035 inch roadrunner wire, a 5 [place] JB 1 catheter was advanced to the aortic arch region and selectively positioned in the right common carotid artery, and biplane DSA carotid and  cerebral angiogram was obtained. Next, with the catheter tip in the left common carotid artery, biplane DSA carotid and cerebral Angiography of the head was obtained. The, selective catheterization of the left subclavian artery followed by angiogram was performed. With the tip of the catheter at the origin of the left vertebral artery, indirect left vertebral and biplane DSA cerebral angiography of left subclavian arterial injection was performed. Next, selective catheterization of the right subclavian artery was performed and the catheter was advanced into the proximal subclavian artery. Next, the wire was exchanged for an exchange length 035 Rosen wire. The JB 1 catheter was removed and 088 neuron max guide catheter and 5 French AXS catalyst catheter were advanced and selective catheterization of the left vertebral artery followed by vertebral and cerebral angiogram was performed. Next, under high magnification angiography, and under roadmap guidance, phenom 21 microcatheter and 0 1 for restarting microwire were advanced into the left posterior cerebral artery and angiogram was performed. Next, the wire was exchanged for a 014 zoom exchange length wire. Subsequently, we selected 2.25 x 15 mm Apex monorail microcatheter and under roadmap guidance,  angioplasty of the severely stenotic right vertebrobasilar artery was performed. Post angioplasty angiogram was performed. Next, the balloon was removed. Measurements were made. Next we selected 3 mm x 22 mm resolute onyx stent and the stent was deployed at the right vertebrobasilar stenotic site. The stent was gently dilated and was inflated up to 10 atmospheric pressure. Next, post stent biplane DSA cerebral angiogram was performed. At this point in time, we felt the goal of the procedure was obtained. The catheter was removed. Femoral access site was further sterilely cleaned and the femoral sheath was withdrawn with its tip in the right external iliac artery, femoral  angiogram was performed in the RAO projection and the femoral access site was closed by using an 8 [place] Angio-Seal. Patient tolerated the procedure well and was transferred to the ICU intubated in stable condition. No immediate complications. FINDINGS: Right CCA: The right common carotid arteriogram demonstrates the right external carotid artery and its major branches to be widely patent. Right ICA: The right internal carotid artery at the bulb to the cranial skull base opacifies normally. No significant stenosis. Right Intracranial: The petrous and cavernous segments are widely patent. Mild 20-30% stenosis at the supraclinoid ICA. The right middle cerebral artery and the right anterior cerebral artery opacify normally into the capillary and venous phases. Mild-to-moderate atheromatous disease with about 30% stenosis in the right M1 segment. There is severe about 70% stenosis of the right pericallosal branch of the ACA seen in the proximal aspect. There is cross filling via the anterior communicating artery seen with opacification of the left anterior cerebral artery. PCOM is patent with faint opacification of the right posterior cerebral artery. Right Sided Anterior Venous: The venous phase demonstrates preferential egress of contrast into the right transverse sinus, the sigmoid sinus and the right internal jugular vein. There is no early venous shunting into the venous structures at the skull base or intracranially into the dural sinuses. Left CCA: The left common carotid arteriogram demonstrates the left external carotid artery and its major branches to be widely patent. Left ICA: The left internal carotid artery at the bulb to the cranial skull base opacifies normally. Mild atheromatous disease at the origin of the ICA. There is mild stenosis seen about 2 cm from the origin of the left ICA measuring 42 percent by the NASCET criteria. Left Intracranial: The petrous, cavernous and supraclinoid segments are  widely patent. The left middle cerebral artery and the left anterior cerebral artery opacify normally into the capillary and venous phases. There is a prominent PCOM opacifying the bilateral posterior cerebral arteries via the distal basilar artery. Left Sided Anterior Venous: The venous phase demonstrates preferential egress of contrast into the right transverse sinus, the sigmoid sinus and the right internal jugular vein. There is opacification of the left transverse sigmoid sinuses and internal jugular veins seen as well. There is no early venous shunting into the venous structures at the skull base or intracranially into the dural sinuses. Indirect left vertebral and cerebral angiogram with injection of the left subclavian artery demonstrate faint opacification of the left vertebral artery. 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 High magnification biplane DSA cerebral angiography of right vertebral artery injection demonstrate severe about 90% stenosis at the right vertebrobasilar junction in length of about 7 mm. Post angioplasty angiogram demonstrate significant residual stenoses. Post stent angiogram demonstrate complete expansion of the stent with no residual stenosis. There is about 40-50% stenosis seen at the mid basilar  artery. Right PICA, bilateral superior cerebellar, AICA and posterior cerebral arteries show moderate atheromatous disease. No significant stenosis. IMPRESSION: 1. Right vertebral and cerebral angiogram demonstrated about 90% stenosis at the vertebrobasilar junction. Successful angioplasty and stent placement without significant residual stenosis. 2.  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 3. 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 4. Mild atheromatous disease in the right intracranial ICA with 20-30% stenosis at the supraclinoid ICA and 30% stenosis in the right M1 segment. There is about 70% stenoses of the  right pericallosal branch of the ACA seen in the A3 segment. 5.  Bilateral P comms are patent with the left PCOM being prominent. PLAN: 1.  Blood pressure goals between 120-140 mm Hg. 2.  Patient to be on dual antiplatelet agents for 6 months. Electronically Signed   By: Frazier Richards M.D.   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 He presented with progressively worsening weakness in the right lower extremity, left lower extremity, right upper extremity, and left upper extremity. On the morning of the procedure, patient was flaccid at the right-sided extremities and had severe weakness in the left-sided extremities (grade 1/5). 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 There was occlusion of the left vertebral artery seen distal to the origin of the PICA. 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 EXAM: 1.  Biplane right carotid and cerebral DSA angiogram. 2.  Biplane left common carotid and cerebral DSA cerebral angiogram 3. Indirect left vertebral and cerebral angiogram with tip of the catheter at the origin of the left vertebral artery 4.  Right subclavian arteriogram. 5. Selective catheterization of the right vertebral artery, biplane vertebral and cerebral DSA cerebral angiogram. 6. Selective catheterization of the right vertebral artery by using the 088 neuron max guide catheter, 5 French AXS catalyst catheter as an intermediate catheter and 021 phenom microcatheter with a 014 Arostotle microwire. 7. High  magnification biplane cerebral angiogram and exchange of the microcatheter over an exchange length 014 zoom wire into an Apex 2.25 x 15 mm monorail balloon catheter and angioplasty of the severely 90% stenotic right vertebrobasilar junction. 8. Resolute onyx 3 mm x 22 mm stent placement at the site of the stenosis. 9. Post stent biplane DSA cerebral angiography of the right vertebral artery injection. 10. Right common femoral arteriogram in the RAO projection after withdrawing the sheath with its tip in the external iliac artery and arteriogram Attending: Dr. Frazier Richards Assistant: Dr. Luanne Bras COMPARISON:  CORRELATION IS MADE WITH CTA OF THE HEAD AND NECK DATED August 03, 2021 MEDICATIONS: The antibiotic was administered within 1 hour of the procedure Injection Ancef 2 g Injection Integrilin 4.5 mg at 1.5 mg increments intra-arterially through the right vertebral artery Heparin 3500 units ANESTHESIA/SEDATION: Anesthesia was provided by the anesthesia team. Please refer to the anesthesia notes for detailed information. CONTRAST:  126 mL of Omnipaque 300 FLUOROSCOPY: Fluoroscopy Time: 48 minutes 48 seconds (3499 mGy). COMPLICATIONS: None immediate. TECHNIQUE: Informed written consent was obtained from the patient and his daughter  after a thorough discussion of the procedural risks including but not limited to approximately 5% bleeding, hematoma, vascular injury, worsening of the stroke, ventilator dependency etc. Benefits and alternatives. All questions were addressed. Maximal Sterile Barrier Technique was utilized including caps, mask, sterile gowns, sterile gloves, sterile drape, hand hygiene and skin antiseptic. A timeout was performed prior to the initiation of the procedure. PROCEDURE: The right groin was prepped and draped in the usual sterile fashion. Thereafter using modified Seldinger technique, transfemoral access into the right common femoral artery was obtained without difficulty. Over a 0.035  inch guidewire, an 8 French x 25 cm pinnacle sheath was inserted. Through this, and also over 0.035 inch roadrunner wire, a 5 [place] JB 1 catheter was advanced to the aortic arch region and selectively positioned in the right common carotid artery, and biplane DSA carotid and cerebral angiogram was obtained. Next, with the catheter tip in the left common carotid artery, biplane DSA carotid and cerebral Angiography of the head was obtained. The, selective catheterization of the left subclavian artery followed by angiogram was performed. With the tip of the catheter at the origin of the left vertebral artery, indirect left vertebral and biplane DSA cerebral angiography of left subclavian arterial injection was performed. Next, selective catheterization of the right subclavian artery was performed and the catheter was advanced into the proximal subclavian artery. Next, the wire was exchanged for an exchange length 035 Rosen wire. The JB 1 catheter was removed and 088 neuron max guide catheter and 5 French AXS catalyst catheter were advanced and selective catheterization of the left vertebral artery followed by vertebral and cerebral angiogram was performed. Next, under high magnification angiography, and under roadmap guidance, phenom 21 microcatheter and 0 1 for restarting microwire were advanced into the left posterior cerebral artery and angiogram was performed. Next, the wire was exchanged for a 014 zoom exchange length wire. Subsequently, we selected 2.25 x 15 mm Apex monorail microcatheter and under roadmap guidance, angioplasty of the severely stenotic right vertebrobasilar artery was performed. Post angioplasty angiogram was performed. Next, the balloon was removed. Measurements were made. Next we selected 3 mm x 22 mm resolute onyx stent and the stent was deployed at the right vertebrobasilar stenotic site. The stent was gently dilated and was inflated up to 10 atmospheric pressure. Next, post stent biplane  DSA cerebral angiogram was performed. At this point in time, we felt the goal of the procedure was obtained. The catheter was removed. Femoral access site was further sterilely cleaned and the femoral sheath was withdrawn with its tip in the right external iliac artery, femoral angiogram was performed in the RAO projection and the femoral access site was closed by using an 8 [place] Angio-Seal. Patient tolerated the procedure well and was transferred to the ICU intubated in stable condition. No immediate complications. FINDINGS: Right CCA: The right common carotid arteriogram demonstrates the right external carotid artery and its major branches to be widely patent. Right ICA: The right internal carotid artery at the bulb to the cranial skull base opacifies normally. No significant stenosis. Right Intracranial: The petrous and cavernous segments are widely patent. Mild 20-30% stenosis at the supraclinoid ICA. The right middle cerebral artery and the right anterior cerebral artery opacify normally into the capillary and venous phases. Mild-to-moderate atheromatous disease with about 30% stenosis in the right M1 segment. There is severe about 70% stenosis of the right pericallosal branch of the ACA seen in the proximal aspect. There is cross filling via the  anterior communicating artery seen with opacification of the left anterior cerebral artery. PCOM is patent with faint opacification of the right posterior cerebral artery. Right Sided Anterior Venous: The venous phase demonstrates preferential egress of contrast into the right transverse sinus, the sigmoid sinus and the right internal jugular vein. There is no early venous shunting into the venous structures at the skull base or intracranially into the dural sinuses. Left CCA: The left common carotid arteriogram demonstrates the left external carotid artery and its major branches to be widely patent. Left ICA: The left internal carotid artery at the bulb to the  cranial skull base opacifies normally. Mild atheromatous disease at the origin of the ICA. There is mild stenosis seen about 2 cm from the origin of the left ICA measuring 42 percent by the NASCET criteria. Left Intracranial: The petrous, cavernous and supraclinoid segments are widely patent. The left middle cerebral artery and the left anterior cerebral artery opacify normally into the capillary and venous phases. There is a prominent PCOM opacifying the bilateral posterior cerebral arteries via the distal basilar artery. Left Sided Anterior Venous: The venous phase demonstrates preferential egress of contrast into the right transverse sinus, the sigmoid sinus and the right internal jugular vein. There is opacification of the left transverse sigmoid sinuses and internal jugular veins seen as well. There is no early venous shunting into the venous structures at the skull base or intracranially into the dural sinuses. Indirect left vertebral and cerebral angiogram with injection of the left subclavian artery demonstrate faint opacification of the left vertebral artery. 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 High magnification biplane DSA cerebral angiography of right vertebral artery injection demonstrate severe about 90% stenosis at the right vertebrobasilar junction in length of about 7 mm. Post angioplasty angiogram demonstrate significant residual stenoses. Post stent angiogram demonstrate complete expansion of the stent with no residual stenosis. There is about 40-50% stenosis seen at the mid basilar artery. Right PICA, bilateral superior cerebellar, AICA and posterior cerebral arteries show moderate atheromatous disease. No significant stenosis. IMPRESSION: 1. Right vertebral and cerebral angiogram demonstrated about 90% stenosis at the vertebrobasilar junction. Successful angioplasty and stent placement without significant residual stenosis. 2.  40-50% stenosis at the mid  basilar artery. 3. 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 4. Mild atheromatous disease in the right intracranial ICA with 20-30% stenosis at the supraclinoid ICA and 30% stenosis in the right M1 segment. There is about 70% stenoses of the right pericallosal branch of the ACA seen in the A3 segment. 5.  Bilateral P comms are patent with the left PCOM being prominent. PLAN: 1.  Blood pressure goals between 120-140 mm Hg. 2.  Patient to be on dual antiplatelet agents for 6 months. Electronically Signed   By: Frazier Richards M.D.   On: 08/09/2021 10:26   DG CHEST PORT 1 VIEW  Result Date: 08/09/2021 CLINICAL DATA:  Tracheostomy dependent. EXAM: PORTABLE CHEST 1 VIEW COMPARISON:  08/06/2021 FINDINGS: The tracheostomy tube tip is above the carina. Heart size and mediastinal contours are stable. Mild left lung base atelectasis with volume loss is stable from the previous exam. Right lung is clear. IMPRESSION: Stable exam. Electronically Signed   By: Kerby Moors M.D.   On: 08/09/2021 08:47   DG Chest Port 1 View  Result Date: 08/06/2021 CLINICAL DATA:  Status post tracheostomy EXAM: PORTABLE CHEST 1 VIEW COMPARISON:  None Available. FINDINGS: Tracheostomy tube  with tip in the proximal trachea in appropriate position. New mild LEFT basilar atelectasis. Mild central venous congestion. IMPRESSION: Tracheostomy tube in good position. New LEFT basilar atelectasis. Electronically Signed   By: Suzy Bouchard M.D.   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 He presented with progressively worsening weakness in the right lower extremity, left lower extremity, right upper extremity, and left upper extremity.  On the morning of the procedure, patient was flaccid at the right-sided extremities and had severe weakness in the left-sided extremities (grade 1/5). 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 There was occlusion of the left vertebral artery seen distal to the origin of the PICA. 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 EXAM: 1.  Biplane right carotid and cerebral DSA angiogram. 2.  Biplane left common carotid and cerebral DSA cerebral angiogram 3. Indirect left vertebral and cerebral angiogram with tip of the catheter at the origin of the left vertebral artery 4.  Right subclavian arteriogram. 5. Selective catheterization of the right vertebral artery, biplane vertebral and cerebral DSA cerebral angiogram. 6. Selective catheterization of the right vertebral artery by using the 088 neuron max guide catheter, 5 French AXS catalyst catheter as an intermediate catheter and 021 phenom microcatheter with a 014 Arostotle microwire. 7. High magnification biplane cerebral angiogram and exchange of the microcatheter over an exchange length 014 zoom wire into an Apex 2.25 x 15 mm monorail balloon catheter and angioplasty of the severely 90% stenotic right vertebrobasilar junction. 8. Resolute onyx 3 mm x 22 mm stent placement at the site of the stenosis. 9. Post stent biplane DSA cerebral angiography of the right vertebral artery injection. 10. Right common femoral arteriogram in the RAO projection after withdrawing the sheath with its tip in the external iliac artery and arteriogram Attending: Dr. Frazier Richards Assistant: Dr. Luanne Bras COMPARISON:  CORRELATION IS MADE WITH CTA OF THE HEAD AND NECK DATED August 03, 2021 MEDICATIONS: The antibiotic was administered within 1 hour of the procedure Injection Ancef 2  g Injection Integrilin 4.5 mg at 1.5 mg increments intra-arterially through the right vertebral artery Heparin 3500 units ANESTHESIA/SEDATION: Anesthesia was provided by the anesthesia team. Please refer to the anesthesia notes for detailed information. CONTRAST:  126 mL of Omnipaque 300 FLUOROSCOPY: Fluoroscopy Time: 48 minutes 48 seconds (3499 mGy). COMPLICATIONS: None immediate. TECHNIQUE: Informed written consent was obtained from the patient and his daughter after a thorough discussion of the procedural risks including but not limited to approximately 5% bleeding, hematoma, vascular injury, worsening of the stroke, ventilator dependency etc. Benefits and alternatives. All questions were addressed. Maximal Sterile Barrier Technique was utilized including caps, mask, sterile gowns, sterile gloves, sterile drape, hand hygiene and skin antiseptic. A timeout was performed prior to the initiation of the procedure. PROCEDURE: The right groin was prepped and draped in the usual sterile fashion. Thereafter using modified Seldinger technique, transfemoral access into the right common femoral artery was obtained without difficulty. Over a 0.035 inch guidewire, an 8 French x 25 cm pinnacle sheath was inserted. Through this, and also over 0.035 inch roadrunner  wire, a 5 [place] JB 1 catheter was advanced to the aortic arch region and selectively positioned in the right common carotid artery, and biplane DSA carotid and cerebral angiogram was obtained. Next, with the catheter tip in the left common carotid artery, biplane DSA carotid and cerebral Angiography of the head was obtained. The, selective catheterization of the left subclavian artery followed by angiogram was performed. With the tip of the catheter at the origin of the left vertebral artery, indirect left vertebral and biplane DSA cerebral angiography of left subclavian arterial injection was performed. Next, selective catheterization of the right subclavian artery  was performed and the catheter was advanced into the proximal subclavian artery. Next, the wire was exchanged for an exchange length 035 Rosen wire. The JB 1 catheter was removed and 088 neuron max guide catheter and 5 French AXS catalyst catheter were advanced and selective catheterization of the left vertebral artery followed by vertebral and cerebral angiogram was performed. Next, under high magnification angiography, and under roadmap guidance, phenom 21 microcatheter and 0 1 for restarting microwire were advanced into the left posterior cerebral artery and angiogram was performed. Next, the wire was exchanged for a 014 zoom exchange length wire. Subsequently, we selected 2.25 x 15 mm Apex monorail microcatheter and under roadmap guidance, angioplasty of the severely stenotic right vertebrobasilar artery was performed. Post angioplasty angiogram was performed. Next, the balloon was removed. Measurements were made. Next we selected 3 mm x 22 mm resolute onyx stent and the stent was deployed at the right vertebrobasilar stenotic site. The stent was gently dilated and was inflated up to 10 atmospheric pressure. Next, post stent biplane DSA cerebral angiogram was performed. At this point in time, we felt the goal of the procedure was obtained. The catheter was removed. Femoral access site was further sterilely cleaned and the femoral sheath was withdrawn with its tip in the right external iliac artery, femoral angiogram was performed in the RAO projection and the femoral access site was closed by using an 8 [place] Angio-Seal. Patient tolerated the procedure well and was transferred to the ICU intubated in stable condition. No immediate complications. FINDINGS: Right CCA: The right common carotid arteriogram demonstrates the right external carotid artery and its major branches to be widely patent. Right ICA: The right internal carotid artery at the bulb to the cranial skull base opacifies normally. No significant  stenosis. Right Intracranial: The petrous and cavernous segments are widely patent. Mild 20-30% stenosis at the supraclinoid ICA. The right middle cerebral artery and the right anterior cerebral artery opacify normally into the capillary and venous phases. Mild-to-moderate atheromatous disease with about 30% stenosis in the right M1 segment. There is severe about 70% stenosis of the right pericallosal branch of the ACA seen in the proximal aspect. There is cross filling via the anterior communicating artery seen with opacification of the left anterior cerebral artery. PCOM is patent with faint opacification of the right posterior cerebral artery. Right Sided Anterior Venous: The venous phase demonstrates preferential egress of contrast into the right transverse sinus, the sigmoid sinus and the right internal jugular vein. There is no early venous shunting into the venous structures at the skull base or intracranially into the dural sinuses. Left CCA: The left common carotid arteriogram demonstrates the left external carotid artery and its major branches to be widely patent. Left ICA: The left internal carotid artery at the bulb to the cranial skull base opacifies normally. Mild atheromatous disease at the origin of the ICA.  There is mild stenosis seen about 2 cm from the origin of the left ICA measuring 42 percent by the NASCET criteria. Left Intracranial: The petrous, cavernous and supraclinoid segments are widely patent. The left middle cerebral artery and the left anterior cerebral artery opacify normally into the capillary and venous phases. There is a prominent PCOM opacifying the bilateral posterior cerebral arteries via the distal basilar artery. Left Sided Anterior Venous: The venous phase demonstrates preferential egress of contrast into the right transverse sinus, the sigmoid sinus and the right internal jugular vein. There is opacification of the left transverse sigmoid sinuses and internal jugular veins  seen as well. There is no early venous shunting into the venous structures at the skull base or intracranially into the dural sinuses. Indirect left vertebral and cerebral angiogram with injection of the left subclavian artery demonstrate faint opacification of the left vertebral artery. 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 High magnification biplane DSA cerebral angiography of right vertebral artery injection demonstrate severe about 90% stenosis at the right vertebrobasilar junction in length of about 7 mm. Post angioplasty angiogram demonstrate significant residual stenoses. Post stent angiogram demonstrate complete expansion of the stent with no residual stenosis. There is about 40-50% stenosis seen at the mid basilar artery. Right PICA, bilateral superior cerebellar, AICA and posterior cerebral arteries show moderate atheromatous disease. No significant stenosis. IMPRESSION: 1. Right vertebral and cerebral angiogram demonstrated about 90% stenosis at the vertebrobasilar junction. Successful angioplasty and stent placement without significant residual stenosis. 2.  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 3. 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 4. Mild atheromatous disease in the right intracranial ICA with 20-30% stenosis at the supraclinoid ICA and 30% stenosis in the right M1 segment. There is about 70% stenoses of the right pericallosal branch of the ACA seen in the A3 segment. 5.  Bilateral P comms are patent with the left PCOM being prominent. PLAN: 1.  Blood pressure goals between 120-140 mm Hg. 2.  Patient to be on dual antiplatelet agents for 6 months. Electronically Signed   By: Frazier Richards M.D.   On: 08/05/2021 14:38   MR BRAIN WO CONTRAST  Result Date: 08/05/2021 CLINICAL DATA:  66 year old male with history of known medullary infarct and severe vertebrobasilar disease, status post catheter directed  revascularization and stenting. EXAM: MRI HEAD WITHOUT CONTRAST MRA HEAD WITHOUT CONTRAST TECHNIQUE: Multiplanar, multi-echo pulse sequences of the brain and surrounding structures were acquired without intravenous contrast. Angiographic images of the Circle of Willis were acquired using MRA technique without intravenous contrast. COMPARISON:  Comparison made with arteriogram from earlier the same day as well as multiple previous studies. FINDINGS: MRI HEAD FINDINGS Brain: There has been interval expansion of previously identified ventral medullary infarct, now extending posteriorly to traversed the entirety of the medulla to the floor of the fourth ventricle (series 5, images 63, 64, 65), slightly worse on the left. Additionally, there are new scattered patchy ischemic infarcts involving the right cerebellum. Few additional patchy small volume ischemic infarcts noted involving the cortical aspects of the right greater than left occipital lobes (series 5, images 79, 77, 75, 73, 72 no significant associated mass effect. Chronic right cerebellar microhemorrhage noted. There is a new small focus of susceptibility artifact within the adjacent right cerebellum, likely a punctate focus of petechial blood products (series 14, image 20). No other associated blood products. Remainder the brain is otherwise stable in  appearance. Stable cerebral volume with chronic small vessel ischemic disease. No new focal parenchymal signal abnormality. Pituitary gland and suprasellar region remain within normal limits. Midline encephalomalacia with chronic hemosiderin staining at the anterior right frontal lobe again noted. Remote lacunar infarcts again noted about the cerebellum, pons, thalami, and left posterior corona radiata. No mass lesion, significant mass effect, or midline shift. Stable ventricular size without hydrocephalus. No extra-axial fluid collection. Vascular: Susceptibility artifact now seen about the vertebrobasilar  junction related to interval stenting. Major intracranial vascular flow voids are maintained. Skull and upper cervical spine: Craniocervical junction within normal limits. Bone marrow signal intensity normal. No new scalp soft tissue abnormality. Sinuses/Orbits: Globes and orbital soft tissues within normal limits. Moderate mucosal thickening throughout the paranasal sinuses with fluid within the nasopharynx. Patient is intubated. Mastoid air cells remain largely clear. Other: None. MRA HEAD FINDINGS Anterior circulation: Visualized distal cervical segments of the internal carotid arteries remain patent with antegrade flow. Petrous segments remain widely patent. Atheromatous irregularity about both carotid siphons with associated moderate stenosis at the supraclinoid left ICA. A1 segments patent. Normal anterior communicating artery complex. Left ACA remains patent. Distal severe right A2/A3 stenosis noted (series 1, image 164), stable. Left M1 segment remains patent. Mild to moderate long segment right M1 stenosis again noted. Negative MCA bifurcations. No proximal MCA branch occlusion. Distal MCA branches remain perfused and symmetric, although demonstrate mild small vessel atheromatous irregularity. Posterior circulation: Heterogeneous atheromatous irregularity again noted about both proximal V4 segments as they course into the cranial vault. Multifocal moderate stenoses involving the proximal left V4 segment again noted. Left V4 remains patent to the takeoff of the left PICA which remains patent and well perfused. Occlusion of the distal left V4 segment beyond the left PICA again noted, stable. On the right, there has been interval placement of a vascular stent extending from the distal right V4 segment across the vertebrobasilar junction into the proximal basilar artery. This traverses the previously seen high-grade stenosis at this level. Grossly patent flow through the stent, although evaluation for possible  intra stent stenosis limited by MRA. Stable and fairly robust flow seen within the basilar artery distally. Suspected short-segment fenestration noted at the distal basilar artery. Superior cerebellar arteries remain patent. Right PCA primarily supplied via the basilar. Left PCA supplied via the basilar as well as a robust left posterior communicating artery. Atheromatous disease involving both PCAs with associated severe bilateral P2 stenoses. PCAs remain patent to their distal aspects although demonstrate small vessel atheromatous irregularity. Anatomic variants: None significant. IMPRESSION: MRI HEAD IMPRESSION: 1. 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 Additionally, there are new scattered small volume ischemic infarcts involving the right cerebellum as well as the cortical aspects of the right greater than left occipital lobes. No associated mass effect. Single punctate focus of associated petechial hemorrhage at the right cerebellum as above. 2. Otherwise stable appearance of the brain with atrophy, chronic microvascular ischemic disease, and multiple chronic ischemic infarcts as above. MRA HEAD IMPRESSION: 1. Interval placement of a vascular stent extending from the distal right V4 segment across the vertebrobasilar junction into the proximal basilar artery. Grossly patent flow through the stent, although evaluation for possible intra stent stenosis is limited by MRA. Stable and fairly robust flow seen within the basilar artery distally. 2. Otherwise stable appearance of the intracranial circulation with moderate multifocal stenoses involving the proximal left V4 segment with  distal left V4 occlusion, severe right A2/A3 stenosis, severe bilateral P2 stenoses, moderate supraclinoid left ICA stenosis, with mild to moderate long segment right M1 stenosis. Electronically Signed   By: Jeannine Boga M.D.   On: 08/05/2021 02:44   MR ANGIO HEAD WO CONTRAST  Result Date: 08/05/2021 CLINICAL DATA:  66 year old male with history of known medullary infarct and severe vertebrobasilar disease, status post catheter directed revascularization and stenting. EXAM: MRI HEAD WITHOUT CONTRAST MRA HEAD WITHOUT CONTRAST TECHNIQUE: Multiplanar, multi-echo pulse sequences of the brain and surrounding structures were acquired without intravenous contrast. Angiographic images of the Circle of Willis were acquired using MRA technique without intravenous contrast. COMPARISON:  Comparison made with arteriogram from earlier the same day as well as multiple previous studies. FINDINGS: MRI HEAD FINDINGS Brain: There has been interval expansion of previously identified ventral medullary infarct, now extending posteriorly to traversed the entirety of the medulla to the floor of the fourth ventricle (series 5, images 63, 64, 65), slightly worse on the left. Additionally, there are new scattered patchy ischemic infarcts involving the right cerebellum. Few additional patchy small volume ischemic infarcts noted involving the cortical aspects of the right greater than left occipital lobes (series 5, images 79, 77, 75, 73, 72 no significant associated mass effect. Chronic right cerebellar microhemorrhage noted. There is a new small focus of susceptibility artifact within the adjacent right cerebellum, likely a punctate focus of petechial blood products (series 14, image 20). No other associated blood products. Remainder the brain is otherwise stable in appearance. Stable cerebral volume with chronic small vessel ischemic disease. No new focal parenchymal signal abnormality. Pituitary gland and suprasellar region remain within normal limits. Midline encephalomalacia with chronic hemosiderin staining at the anterior right frontal lobe again noted. Remote lacunar infarcts again noted about the cerebellum, pons, thalami, and left  posterior corona radiata. No mass lesion, significant mass effect, or midline shift. Stable ventricular size without hydrocephalus. No extra-axial fluid collection. Vascular: Susceptibility artifact now seen about the vertebrobasilar junction related to interval stenting. Major intracranial vascular flow voids are maintained. Skull and upper cervical spine: Craniocervical junction within normal limits. Bone marrow signal intensity normal. No new scalp soft tissue abnormality. Sinuses/Orbits: Globes and orbital soft tissues within normal limits. Moderate mucosal thickening throughout the paranasal sinuses with fluid within the nasopharynx. Patient is intubated. Mastoid air cells remain largely clear. Other: None. MRA HEAD FINDINGS Anterior circulation: Visualized distal cervical segments of the internal carotid arteries remain patent with antegrade flow. Petrous segments remain widely patent. Atheromatous irregularity about both carotid siphons with associated moderate stenosis at the supraclinoid left ICA. A1 segments patent. Normal anterior communicating artery complex. Left ACA remains patent. Distal severe right A2/A3 stenosis noted (series 1, image 164), stable. Left M1 segment remains patent. Mild to moderate long segment right M1 stenosis again noted. Negative MCA bifurcations. No proximal MCA branch occlusion. Distal MCA branches remain perfused and symmetric, although demonstrate mild small vessel atheromatous irregularity. Posterior circulation: Heterogeneous atheromatous irregularity again noted about both proximal V4 segments as they course into the cranial vault. Multifocal moderate stenoses involving the proximal left V4 segment again noted. Left V4 remains patent to the takeoff of the left PICA which remains patent and well perfused. Occlusion of the distal left V4 segment beyond the left PICA again noted, stable. On the right, there has been interval placement of a vascular stent extending from the  distal right V4 segment across the vertebrobasilar junction into the proximal basilar artery. This traverses  the previously seen high-grade stenosis at this level. Grossly patent flow through the stent, although evaluation for possible intra stent stenosis limited by MRA. Stable and fairly robust flow seen within the basilar artery distally. Suspected short-segment fenestration noted at the distal basilar artery. Superior cerebellar arteries remain patent. Right PCA primarily supplied via the basilar. Left PCA supplied via the basilar as well as a robust left posterior communicating artery. Atheromatous disease involving both PCAs with associated severe bilateral P2 stenoses. PCAs remain patent to their distal aspects although demonstrate small vessel atheromatous irregularity. Anatomic variants: None significant. IMPRESSION: MRI HEAD IMPRESSION: 1. 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 Additionally, there are new scattered small volume ischemic infarcts involving the right cerebellum as well as the cortical aspects of the right greater than left occipital lobes. No associated mass effect. Single punctate focus of associated petechial hemorrhage at the right cerebellum as above. 2. Otherwise stable appearance of the brain with atrophy, chronic microvascular ischemic disease, and multiple chronic ischemic infarcts as above. MRA HEAD IMPRESSION: 1. Interval placement of a vascular stent extending from the distal right V4 segment across the vertebrobasilar junction into the proximal basilar artery. Grossly patent flow through the stent, although evaluation for possible intra stent stenosis is limited by MRA. Stable and fairly robust flow seen within the basilar artery distally. 2. Otherwise stable appearance of the intracranial circulation with moderate multifocal stenoses involving the proximal left  V4 segment with distal left V4 occlusion, severe right A2/A3 stenosis, severe bilateral P2 stenoses, moderate supraclinoid left ICA stenosis, with mild to moderate long segment right M1 stenosis. Electronically Signed   By: Jeannine Boga M.D.   On: 08/05/2021 02:44   DG Abd 1 View  Result Date: 08/04/2021 CLINICAL DATA:  NG tube placement EXAM: ABDOMEN - 1 VIEW COMPARISON:  10/11/2012 FINDINGS: Tip of NG tube is seen in the region of the antrum of the stomach. Bowel gas pattern is nonspecific. Pelvis is not included in its entirety. IMPRESSION: Tip of NG tube is seen in the stomach. Electronically Signed   By: Elmer Picker M.D.   On: 08/04/2021 17:32   DG Chest Port 1 View  Result Date: 08/04/2021 CLINICAL DATA:  Intubated EXAM: PORTABLE CHEST 1 VIEW COMPARISON:  08/04/2021 FINDINGS: Endotracheal tube tip is about 4.7 cm superior to the carina. Subsegmental atelectasis at the left base. Cardiomegaly. No consolidation, pleural effusion or pneumothorax IMPRESSION: 1. Endotracheal tube tip about 4.7 cm superior to the carina. 2. Cardiomegaly.  Subsegmental atelectasis at the left base. Electronically Signed   By: Donavan Foil M.D.   On: 08/04/2021 15:10   DG Chest Port 1 View  Result Date: 08/04/2021 CLINICAL DATA:  Endotracheal intubation EXAM: PORTABLE CHEST 1 VIEW COMPARISON:  Earlier same day FINDINGS: Endotracheal tube tip 6 cm above the carina. Cardiomegaly persists. Pulmonary venous hypertension, possibly with early interstitial edema. Right lateral chest not included on the image. No evidence of collapse or effusion. IMPRESSION: Endotracheal tube tip 6 cm above the carina. Electronically Signed   By: Nelson Chimes M.D.   On: 08/04/2021 13:42   ECHOCARDIOGRAM COMPLETE  Result Date: 08/04/2021    ECHOCARDIOGRAM REPORT   Patient Name:   Joshua Donovan Date of Exam: 08/03/2021 Medical Rec #:  [id number]   Height:       67.0 in Accession #:    [id number]  Weight:  185.0 lb Date of  Birth:  Jul 09, 1955   BSA:          1.956 m Patient Age:    23 years    BP:           142/79 mmHg Patient Gender: M           HR:           76 bpm. Exam Location:  [facility] Procedure: 2D Echo, Cardiac Doppler and Color Doppler Indications:     I63.9 Stroke  History:         Patient has no prior history of Echocardiogram examinations.                  Risk Factors:Diabetes, Dyslipidemia, Hypertension and Former                  Smoker.  Sonographer:     Rosalia Hammers Referring Phys:  [id number] Athena Masse Diagnosing Phys: [place]  1. Left ventricular ejection fraction, by estimation, is 60 to 65%. The left ventricle has normal function. The left ventricle has no regional wall motion abnormalities. There is moderate concentric left ventricular hypertrophy. Left ventricular diastolic parameters are consistent with Grade I diastolic dysfunction (impaired relaxation).  2. Right ventricular systolic function is normal. The right ventricular size is normal.  3. The mitral valve is normal in structure. Trivial mitral valve regurgitation. No evidence of mitral stenosis.  4. The aortic valve is normal in structure. Aortic valve regurgitation is mild. No aortic stenosis is present.  5. The inferior vena cava is normal in size with greater than 50% respiratory variability, suggesting right atrial pressure of 3 mmHg. FINDINGS  Left Ventricle: Left ventricular ejection fraction, by estimation, is 60 to 65%. The left ventricle has normal function. The left ventricle has no regional wall motion abnormalities. The left ventricular internal cavity size was normal in size. There is  moderate concentric left ventricular hypertrophy. Left ventricular diastolic parameters are consistent with Grade I diastolic dysfunction (impaired relaxation). Right Ventricle: The right ventricular size is normal. No increase in right ventricular wall thickness. Right ventricular systolic function is normal. Left Atrium: Left atrial size was  normal in size. Right Atrium: Right atrial size was normal in size. Pericardium: There is no evidence of pericardial effusion. Mitral Valve: The mitral valve is normal in structure. Trivial mitral valve regurgitation. No evidence of mitral valve stenosis. Tricuspid Valve: The tricuspid valve is normal in structure. Tricuspid valve regurgitation is trivial. No evidence of tricuspid stenosis. Aortic Valve: The aortic valve is normal in structure. Aortic valve regurgitation is mild. Aortic regurgitation PHT measures 1577 msec. No aortic stenosis is present. Aortic valve mean gradient measures 5.0 mmHg. Aortic valve peak gradient measures 9.1 mmHg. Aortic valve area, by VTI measures 2.30 cm. Pulmonic Valve: The pulmonic valve was normal in structure. Pulmonic valve regurgitation is not visualized. No evidence of pulmonic stenosis. Aorta: The aortic root is normal in size and structure. Venous: The inferior vena cava is normal in size with greater than 50% respiratory variability, suggesting right atrial pressure of 3 mmHg. IAS/Shunts: No atrial level shunt detected by color flow Doppler.  LEFT VENTRICLE PLAX 2D LVIDd:         3.84 cm   Diastology LVIDs:         2.17 cm   LV e' medial:    6.39 cm/s LV PW:         1.65 cm   LV  E/e' medial:  9.6 LV IVS:        1.18 cm   LV e' lateral:   7.83 cm/s LVOT diam:     1.90 cm   LV E/e' lateral: 7.8 LV SV:         74 LV SV Index:   38 LVOT Area:     2.84 cm  RIGHT VENTRICLE RV Basal diam:  3.15 cm RV S prime:     17.40 cm/s TAPSE (M-mode): 2.4 cm LEFT ATRIUM             Index        RIGHT ATRIUM           Index LA diam:        3.20 cm 1.64 cm/m   RA Area:     18.10 cm LA Vol (A2C):   72.4 ml 37.01 ml/m  RA Volume:   47.10 ml  24.08 ml/m LA Vol (A4C):   65.8 ml 33.63 ml/m LA Biplane Vol: 69.3 ml 35.42 ml/m  AORTIC VALVE AV Area (Vmax):    2.10 cm AV Area (Vmean):   2.06 cm AV Area (VTI):     2.30 cm AV Vmax:           151.00 cm/s AV Vmean:          100.000 cm/s AV VTI:             0.323 m AV Peak Grad:      9.1 mmHg AV Mean Grad:      5.0 mmHg LVOT Vmax:         112.00 cm/s LVOT Vmean:        72.600 cm/s LVOT VTI:          0.262 m LVOT/AV VTI ratio: 0.81 AI PHT:            1577 msec  AORTA Ao Root diam: 3.20 cm MITRAL VALVE MV Area (PHT): 3.20 cm     SHUNTS MV Decel Time: 237 msec     Systemic VTI:  0.26 m MV E velocity: 61.30 cm/s   Systemic Diam: 1.90 cm MV A velocity: 101.00 cm/s MV E/A ratio:  0.61 Shaukat Khan Electronically signed by Neoma Laming Signature Date/Time: 08/04/2021/8:55:17 AM    Final    MR BRAIN WO CONTRAST  Result Date: 08/03/2021 CLINICAL DATA:  66 year old male with severe intracranial atherosclerosis and questionable medullary ischemia on brain MRI yesterday. EXAM: MRI HEAD WITHOUT CONTRAST TECHNIQUE: Multiplanar, multiecho pulse sequences of the brain and surrounding structures were obtained without intravenous contrast. COMPARISON:  CTA head and neck this morning.  Brain MRI yesterday. FINDINGS: Brain: More convincing appearance of ventral medullary restricted diffusion, asymmetrically greater to the left of midline. See series 5, image 12, series 7, images 18 and 19. Mild if any associated T2 and FLAIR hyperintensity there. And no other convincing diffusion restriction despite the CTA findings earlier today. 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 Chronic encephalomalacia right inferior frontal gyrus with hemosiderin. Occasional chronic microhemorrhages elsewhere, right cerebellum (series 13, image 19). No new signal abnormality compared to yesterday. No midline shift, mass effect, evidence of mass lesion, ventriculomegaly, extra-axial collection or acute intracranial hemorrhage. Cervicomedullary junction and pituitary are within normal limits. Vascular: Major intracranial vascular flow voids are stable from yesterday. See also CTA today reported separately. Skull and upper cervical spine:  Negative. Visualized bone marrow signal is within normal limits. Sinuses/Orbits: Stable, negative. Other:  Visible internal auditory structures appear normal. Mastoids are clear. Negative visible scalp and face. IMPRESSION: 1. Positive for ischemia/infarction of the ventral Medulla as suspected yesterday, slightly greater on the left. No associated hemorrhage or mass effect. 2. 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 These results were communicated to Dr. Rory Percy at 11:06 am on 08/03/2021 by text page via the [facility] messaging system. Electronically Signed   By: Genevie Ann M.D.   On: 08/03/2021 11:06   CT ANGIO HEAD NECK W WO CM  Addendum Date: 08/03/2021   ADDENDUM REPORT: 08/03/2021 09:17 ADDENDUM: Study discussed by telephone with Dr. Amie Portland on 08/03/2021 at 0911 hours. Electronically Signed   By: Genevie Ann M.D.   On: 08/03/2021 09:17   Result Date: 08/03/2021 CLINICAL DATA:  66 year old male with neurologic deficit. Questionable small vessel ischemia of the medulla on brain MRI yesterday. EXAM: CT ANGIOGRAPHY HEAD AND NECK TECHNIQUE: Multidetector CT imaging of the head and neck was performed using the standard protocol during bolus administration of intravenous contrast. Multiplanar CT image reconstructions and MIPs were obtained to evaluate the vascular anatomy. Carotid stenosis measurements (when applicable) are obtained utilizing NASCET criteria, using the distal internal carotid diameter as the denominator. RADIATION DOSE REDUCTION: This exam was performed according to the departmental dose-optimization program which includes automated exposure control, adjustment of the mA and/or kV according to patient size and/or use of iterative reconstruction technique. CONTRAST:  33m OMNIPAQUE IOHEXOL 350 MG/ML SOLN COMPARISON:  Brain MRI yesterday.  Head CT yesterday. FINDINGS:  CT HEAD Brain: Bulky Calcified atherosclerosis at the skull base. Stable CT appearance of the brainstem since yesterday. No acute intracranial hemorrhage identified. No midline shift, mass effect, or evidence of intracranial mass lesion. Chronic right inferior frontal gyrus encephalomalacia, patchy bilateral cerebral white matter hypodensity more pronounced on the left. 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 No ventriculomegaly or acute cortically based infarct. Calvarium and skull base: No acute osseous abnormality identified. Paranasal sinuses: Visualized paranasal sinuses and mastoids are stable and well aerated. Orbits: Visualized orbits and scalp soft tissues are within normal limits. CTA NECK Skeleton: Periapical dental lucency bilaterally. Ordinary cervical spine degeneration. No acute osseous abnormality identified. Upper chest: Centrilobular and paraseptal emphysema. No superior mediastinal lymphadenopathy. Other neck: 15 mm hypodense right thyroid nodule. Recommend thyroid [place](ref: J Am Coll Radiol. 2015 Feb;12(2): 143-50).Dermal and subcutaneous probable 2.6 cm sebaceous cyst of the left submandibular face (series 13, image 79). Otherwise negative CT appearance of the neck soft tissues. Aortic arch: 3 vessel arch configuration. Mild arch atherosclerosis. Right carotid system: Negative brachiocephalic artery and proximal right CCA. Retropharyngeal course of the right carotid bifurcation with mild calcified plaque. Tortuous right ICA distal to the bulb, no stenosis. Left carotid system: Similar mild tortuosity and atherosclerosis with no hemodynamically significant stenosis. Vertebral arteries: Tortuous proximal right subclavian artery with a mildly kinked appearance in the superior mediastinum. Normal right vertebral artery origin. Patent right vertebral artery to the skull base with no significant plaque or stenosis. Mild to moderate soft and calcified plaque in the proximal left  subclavian artery with less than 50 % stenosis with respect to the distal vessel. Mild atherosclerosis and stenosis at the left vertebral artery origin on series 13, image 159. Non dominant left vertebral artery than is patent to the skull base with no additional plaque or stenosis. CTA HEAD Posterior circulation: Bilateral V4 segment  calcified plaque. Severe stenosis on the left (series 12, image 139) upstream of the left PICA origin which remains patent. But the left V4 segment is occluded distal to the left PICA. Contralateral right V4 moderate tandem stenoses upstream of the patent right PICA origin on series 12, image 130. And severe stenosis, near occlusion of the right vertebrobasilar junction (series 16, image 26). The basilar artery remains patent but is moderately stenotic in the mid basilar segment on series 16, image 24. Distal basilar remains patent along with SCA and PCA origins. Left posterior communicating artery is present, the right is diminutive or absent. a mild to moderate stenosis left PCA origin. And bilateral P2 segment severe stenosis, near occlusion on series 15, image 53. But there is preserved distal PCA branch enhancement bilaterally. Anterior circulation: Both ICA siphons are patent. However, on the left there is bulky soft plaque or thrombus in the supraclinoid ICA (series 14, image 162) this is between normal appearing left ophthalmic and posterior communicating artery origins. Left carotid terminus remains patent. Contralateral right ICA siphon with mild to moderate calcified plaque and mild supraclinoid stenosis. Patent carotid termini, MCA and ACA origins. Tortuous A1 segments. Normal anterior communicating artery. Proximal A2 segments are normal. There is severe right ACA distal A2 segment stenosis, near occlusion on series 17, image 32. Left MCA M1 segment is mildly irregular. Left MCA bifurcation is patent without stenosis. Left MCA branches are mildly irregular. Mild to  moderate somewhat long segment right MCA M1 stenosis along 6 mm series 16, image 20. Distal M1 and right MCA trifurcation remain patent. Right MCA branches are mildly irregular. Venous sinuses: Early contrast timing, grossly patent. Anatomic variants: Mildly dominant right vertebral artery. Review of the MIP images confirms the above findings IMPRESSION: 1. 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 - Moderate stenosis Mid Basilar Artery. - Near Occlusion bilateral PCA P2 segments, distal right ACA A2. - bulky soft plaque or thrombus in the Supraclinoid Left ICA, with up to Moderate stenosis. - mild-to-moderate Right MCA M1 stenosis. 2. Stable CT appearance of the brain since yesterday. No new intracranial abnormality. 3. Aortic Atherosclerosis (ICD10-I70.0) and Emphysema (ICD10-J43.9). Electronically Signed: By: Genevie Ann M.D. On: 08/03/2021 09:01   US Carotid Bilateral (at [facility] and [facility] only)  Result Date: 08/03/2021 CLINICAL DATA:  Follow-up examination for stroke. EXAM: BILATERAL CAROTID DUPLEX ULTRASOUND TECHNIQUE: Pearline Cables scale imaging, color Doppler and duplex ultrasound were performed of bilateral carotid and vertebral arteries in the neck. COMPARISON:  Prior Study from 04/03/2014. FINDINGS: Criteria: Quantification of carotid stenosis is based on velocity parameters that correlate the residual internal carotid diameter with NASCET-based stenosis levels, using the diameter of the distal internal carotid lumen as the denominator for stenosis measurement. The following velocity measurements were obtained: RIGHT ICA: 84/10 cm/sec CCA: 26/71 cm/sec SYSTOLIC ICA/CCA RATIO:  1.1 ECA: 64 cm/sec LEFT ICA: 64/16 cm/sec CCA: 245/80 cm/sec SYSTOLIC ICA/CCA RATIO:  0.9 ECA: 117 cm/sec RIGHT CAROTID ARTERY: Mild smooth intimal thickening seen within the visualized right CCA without  significant stenosis. Mild heterogeneous mural based echogenic plaque seen about the right carotid bulb. No significant elevation in peak systolic velocity to suggest hemodynamically significant greater than 50% stenosis. Visualized right ICA patent distally without stenosis. RIGHT VERTEBRAL ARTERY:  Patent with antegrade flow. LEFT CAROTID ARTERY: Diffuse smooth intimal thickening seen within the visualized left CCA without significant stenosis. Mild scattered heterogeneous  echogenic plaque about the left carotid bulb/proximal left ICA. No significant elevation in peak systolic velocity to suggest hemodynamically significant greater than 50% stenosis. Visualized left ICA patent distally without visible stenosis. LEFT VERTEBRAL ARTERY:  Patent with antegrade flow. IMPRESSION: 1. Mild atherosclerotic plaque within the carotid bulbs and proximal ICAs bilaterally, but no hemodynamically significant greater than 50% stenosis. 2. Patent antegrade flow within both vertebral arteries. Electronically Signed   By: Jeannine Boga M.D.   On: 08/03/2021 03:26   MR Cervical Spine Wo Contrast  Result Date: 08/03/2021 CLINICAL DATA:  Initial evaluation for ataxia, right-sided numbness. EXAM: MRI CERVICAL SPINE WITHOUT CONTRAST TECHNIQUE: Multiplanar, multisequence MR imaging of the cervical spine was performed. No intravenous contrast was administered. COMPARISON:  None available. FINDINGS: Alignment: Examination degraded by motion artifact. Straightening with mild reversal of the normal cervical lordosis. No listhesis. Vertebrae: Vertebral body height maintained without acute or chronic fracture. Bone marrow signal intensity within normal limits. No discrete or worrisome osseous lesions or abnormal marrow edema. Cord: Normal signal and morphology. No convincing cord signal changes on this motion degraded exam. Posterior Fossa, vertebral arteries, paraspinal tissues: Unremarkable. Disc levels: C2-C3: Unremarkable. C3-C4:  Mild disc bulge with endplate and uncovertebral spurring. Flattening and partial effacement of the ventral thecal sac with resultant mild spinal stenosis. Moderate bilateral C4 foraminal narrowing. C4-C5: Mild disc bulge with uncovertebral spurring. Mild flattening of the ventral thecal sac without significant spinal stenosis. Moderate right with mild left C5 foraminal stenosis. C5-C6: Minimal disc bulge with uncovertebral spurring. No spinal stenosis. Mild right C6 foraminal narrowing. Left neural foramen remains patent. C6-C7: Minimal annular disc bulge. No spinal stenosis. Foramina remain patent. C7-T1:  Unremarkable. Visualized upper thoracic spine demonstrates no significant finding. IMPRESSION: 1. Motion degraded exam. 2. No acute abnormality within the cervical spine. 3. Mild multilevel cervical spondylosis with resultant mild spinal stenosis at C3-4. Associated moderate bilateral C4 foraminal stenosis, with mild right C5 and C6 foraminal narrowing. Electronically Signed   By: Jeannine Boga M.D.   On: 08/03/2021 01:04   MR BRAIN WO CONTRAST  Result Date: 08/03/2021 CLINICAL DATA:  Initial evaluation for neuro deficit, stroke suspected. EXAM: MRI HEAD WITHOUT CONTRAST TECHNIQUE: Multiplanar, multiecho pulse sequences of the brain and surrounding structures were obtained without intravenous contrast. COMPARISON:  Prior CT from earlier the same day as well as previous MRI from 09/17/2015. FINDINGS: Brain: Generalized age-related cerebral atrophy. Patchy T2/FLAIR hyperintensity involving the periventricular and deep white matter both cerebral hemispheres as well as the pons, most consistent with chronic small vessel ischemic disease, moderately advanced in nature. Area of encephalomalacia of involving parasagittal anterior/inferior right frontal lobe noted at site of prior hemorrhage. Additional smaller foci of encephalomalacia involving the inferior temporal poles bilaterally likely posttraumatic in  nature. Few scattered small remote left cerebellar infarcts noted. Subtle area of diffusion signal abnormality involving the ventral medulla measuring up to approximately 8 mm is seen (a series 5, image 12). While this finding could potentially be artifactual, possibly due to prominent vascular calcifications about the adjacent vertebral arteries, a possible small acute ischemic infarct could be considered. No associated hemorrhage or mass effect. No other evidence for acute or subacute ischemia. No acute intracranial hemorrhage. Single chronic microhemorrhage noted within the right cerebellum, of doubtful significance in isolation. No mass lesion, midline shift or mass effect. No hydrocephalus or extra-axial fluid collection. Pituitary gland and suprasellar region within normal limits. Vascular: Major intracranial vascular flow voids are maintained. Skull and upper cervical spine: Craniocervical  junction within normal limits. Bone marrow signal intensity normal. No scalp soft tissue abnormality. Sinuses/Orbits: Globes and orbital soft tissues within normal limits. Paranasal sinuses are largely clear. No mastoid effusion. Other: None. IMPRESSION: 1. 8 mm focus of diffusion signal abnormality involving the ventral medulla, indeterminate. While this finding could potentially be artifactual in nature, a possible small acute ischemic infarct is difficult to exclude, and could be considered in the correct clinical setting. No associated hemorrhage or mass effect. 2. No other acute intracranial abnormality. 3. Underlying atrophy with moderate chronic small vessel ischemic disease with a few small remote left cerebellar infarcts. Area of encephalomalacia of involving the anterior/inferior right frontal lobe at site of previous hemorrhage. Electronically Signed   By: Jeannine Boga M.D.   On: 08/03/2021 00:59   CT Head Wo Contrast  Result Date: 08/02/2021 CLINICAL DATA:  New right upper extremity tremor. Tingling  to the arm. EXAM: CT HEAD WITHOUT CONTRAST TECHNIQUE: Contiguous axial images were obtained from the base of the skull through the vertex without intravenous contrast. RADIATION DOSE REDUCTION: This exam was performed according to the departmental dose-optimization program which includes automated exposure control, adjustment of the mA and/or kV according to patient size and/or use of iterative reconstruction technique. COMPARISON:  MRI brain 09/17/2015 FINDINGS: Brain: Mild cerebral atrophy. Mild ventricular dilatation consistent with central atrophy. Patchy low-attenuation changes in the deep white matter consistent small vessel ischemic change. Old area of encephalomalacia in the right anterior frontal lobe corresponding to previous intraparenchymal hemorrhage on prior MRI. No acute mass effect or midline shift. No abnormal extra-axial fluid collections. Gray-white matter junctions are distinct. Basal cisterns are not effaced. No acute intracranial hemorrhage. Vascular: No hyperdense vessel or unexpected calcification. Skull: Normal. Negative for fracture or focal lesion. Sinuses/Orbits: Paranasal sinuses and mastoid air cells are clear. Other: None. IMPRESSION: No acute intracranial abnormalities. Chronic atrophy and small vessel ischemic changes. 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 Electronically Signed   By: Lucienne Capers M.D.   On: 08/02/2021 21:20   DG Foot Complete Right  Result Date: 08/02/2021 CLINICAL DATA:  Chronic RIGHT arm and leg pain for since Sunday, RIGHT leg cold to touch, swelling RIGHT great toe EXAM: RIGHT FOOT COMPLETE - 3+ VIEW COMPARISON:  None Available. FINDINGS: Osseous mineralization normal. 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 Remaining joint spaces preserved. No acute fracture, dislocation, or bone destruction. Small plantar calcaneal spur. IMPRESSION: Degenerative changes first MTP joint and small plantar calcaneal spur. No  acute osseous abnormalities. Electronically Signed   By: Lavonia Dana M.D.   On: 08/02/2021 20:51     PHYSICAL EXAM  Temp:  [97.7 F (36.5 C)-98.9 F (37.2 C)] 98.3 F (36.8 C) (08/02 1200) Pulse Rate:  [61-69] 61 (08/02 1400) Resp:  [17-19] 18 (08/02 1400) BP: (105-145)/(67-87) 140/79 (08/02 1400) SpO2:  [97 %-100 %] 98 % (08/02 1400) FiO2 (%):  [40 %] 40 % (08/02 1141)  General - Well nourished, well developed, middle-aged African-American male on Precedex, Fentanyl drip and mechanical ventilator in the ICU. Respiratory- ETT in place  Neuro -ETT tube in place - Eyes open spontaneously and to voice. Following midline commands, eyes in mid position but will track throughout visual fields. No movement in all extremities with painful stimuli. Dense quadriplegia and cannot move extremities.  ASSESSMENT/PLAN Mr. Joshua Donovan is a 66 y.o. male with history of remote TBI with ICH, hypertension, hyperlipidemia, diabetes admitted for right leg/foot weakness and pain, right hand numbness,  imbalance.  Gradually developed left leg weakness also during admission.  No tPA given due to outside window.  Quadriplegia on examination, attempt at extubation failed with immediate reintubation. Trach and PEG done 7/28. Significant bleeding from trach with bronchoscopy and oral reintubation on 7/31. Trach revision planned for 8/2. 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 Held for trach revision and OR today.  Ongoing aggressive stroke risk factor management Therapy recommendations: SNF vs. 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 Grossly patent flow through the stent, although evaluation for possible intra stent stenosis is limited by MRA  Respiratory failure Reintubated right after extubation Trach 7/28 Weaning on ventilator- PCCM is primary Concern for evolving pneumonia on 5-day course of ceftriaxone Intratracheal bleeding associated with trach 7/31, reintubated 2/2 inability to pass tracheostomy tube through stoma ENT trach revision planned for 8/2  Diabetes HgbA1c 8.3 goal < 7.0 Controlled CBG monitoring SSI DM education and close PCP follow up  Hypertension Stable BP goal < 180/105 Discontinued Cleviprex On home Norvasc 10 mg Previously documented allergy to Lisinopril with facial swelling, Lisinopril discontinued Metoprolol increased to '25mg'$  BID, HCTZ reinitiated with improved renal function, hydralazine PRN added for further blood pressure control Continue clonidine 0.'1mg'$  TID for BP control Long term BP goal normotensive  Hyperlipidemia Home meds: Lipitor 20 LDL 116, goal < 70 Now on Lipitor 80 Continue statin at discharge  Other Stroke Risk Factors Advanced age Former cigarette smoker quit smoking 33 years ago ETOH use, 1 drink per day  Other Active Problems Remote TBI with traumatic ICH  Rolanda Lundborg, MD, PGY-1  I have personally  obtained history,examined this patient, reviewed notes, independently viewed imaging studies, participated in medical decision making and plan of care.ROS completed by me personally and pertinent positives fully documented  I have made any additions or clarifications directly to the above note. Agree with note above.  Continue ventilatory support as per critical care team for respiratory failure.  Plan elective tracheostomy today by ENT in OR.  No family available at the bedside for discussion.  Discussed with Dr. Lynetta Mare critical care medicine.This patient is critically ill and at significant risk of neurological worsening, death and care requires constant monitoring of vital signs, hemodynamics,respiratory and cardiac monitoring, extensive review of multiple databases, frequent neurological assessment, discussion with family, other specialists and medical decision making of high complexity.I have made any additions or clarifications directly to the above note.This critical care time does not reflect procedure time, or teaching time or  supervisory time of PA/NP/Med Resident etc but could involve care discussion time.  I spent 30 minutes of neurocritical care time  in the care of  this patient.      Antony Contras, MD Medical Director [facility] Pager: [phone] 08/11/2021 2:13 PM  To contact Stroke Continuity provider, please refer to [email or web address] After hours, contact General Neurology

## 2021-08-11 NOTE — Anesthesia Preprocedure Evaluation (Addendum)
Anesthesia Evaluation    Reviewed: Allergy & Precautions, Patient's Chart, lab work & pertinent test results, reviewed documented beta blocker date and time   Airway Mallampati: Intubated  TM Distance: >3 FB Neck ROM: Full    Dental no notable dental hx.    Pulmonary former smoker,  Quit smoking 1990 Chronic respiratory failure  ? 7/31 Bleeding around trach site, bright red blood. Copious bloody secretions. Cleviprex off, SBPs 150s-160s. Basal insulin/TF coverage increased. Cefepime deescalated to ceftriaxone. CXR stable. Later in afternoon, continued peritrach bleeding. Bronchoscopic eval completed with intratracheal bleeding associated with trach. Removed, patient orally reintubated, stoma packed. ? 8/1 ENT consulted for trach revision. Lightly sedated. Vent full support/PRVC, given fatigue following events of yesterday. ENT attempted to evaluate trach at bedside, could not pass scope. Plan for OR 8/2. ASA/Brilinta held.   Pulmonary exam normal breath sounds clear to auscultation       Cardiovascular hypertension, Pt. on medications and Pt. on home beta blockers Normal cardiovascular exam Rhythm:Regular Rate:Normal     Neuro/Psych Admitted 7/24 with stroke sx, diagnostic cerebral angiogram on 7/26 which found severe 90% stenosis of right vertebrobasilar junction for which stent was placed.   Failed extubation after stent placement, unable to move extremities- reintubated and trached 7/28 CVA, Residual Symptoms negative psych ROS   GI/Hepatic Neg liver ROS, GERD  Controlled,  Endo/Other  diabetes, Well Controlled, Type 2, Oral Hypoglycemic Agents  Renal/GU negative Renal ROS  negative genitourinary   Musculoskeletal negative musculoskeletal ROS (+)   Abdominal   Peds  Hematology Hb 12   Anesthesia Other Findings   Reproductive/Obstetrics negative OB ROS                             Anesthesia  Physical Anesthesia Plan  ASA: 4  Anesthesia Plan: General   Post-op Pain Management: Ofirmev IV (intra-op)*   Induction: Intravenous  PONV Risk Score and Plan: 3 and Ondansetron, Dexamethasone, Treatment may vary due to age or medical condition and Midazolam  Airway Management Planned: Oral ETT and Tracheostomy  Additional Equipment: None  Intra-op Plan:   Post-operative Plan: Post-operative intubation/ventilation  Informed Consent: I have reviewed the patients History and Physical, chart, labs and discussed the procedure including the risks, benefits and alternatives for the proposed anesthesia with the patient or authorized representative who has indicated his/her understanding and acceptance.     Dental advisory given  Plan Discussed with: CRNA  Anesthesia Plan Comments:         Anesthesia Quick Evaluation

## 2021-08-11 NOTE — Progress Notes (Signed)
SLP Cancellation Note  Patient Details Name: Joshua Donovan MRN: [id number] DOB: 04-26-1955   Cancelled treatment:       Reason Eval/Treat Not Completed: Patient not medically ready. Pt going for trach revision today with ENT, orally intubated and sedated right now. Will f/u tomorrow to work on some augmentative communication strategies if pt is able.    Joshua Donovan, [name] 08/11/2021, 11:16 AM

## 2021-08-11 NOTE — Op Note (Signed)
OPERATIVE NOTE  Aswad Wandrey Date/Time of Admission: 08/03/2021  9:41 PM  CSN: [id number];FFM:[id number] Attending Provider: Kipp Brood, MD Room/Bed: MCPO/NONE DOB: [birth year] Age: 66 y.o.   Pre-Op Diagnosis: respiratory failure, trach complication  Post-Op Diagnosis: respiratory failure, trach complication  Procedure: Procedure(s): REVISION TRACHEOSTOMY WITH CLOSURE OF NECK WOUND MEASURING 2CM  Anesthesia: General  Surgeon(s): [place], DO  Staff: Circulator: Hal Morales, RN Scrub Person: Curlene Labrum; Juanito Doom, [place] K  Implants: * No implants in log *  Specimens: * No specimens in log *  Complications: None  EBL: 5 ML  Condition: stable  Operative Findings:  Complete closure of previous tracheostomy site with persistent skin and soft tissue defect  Description of Operation: Once operative consent was obtained and the site and surgery were confirmed with the patient and the operating room team, the patient was brought back to the operating room intubated. Once the patient was properly sedated, the patient was turned over to the ENT service. The existing trach site was explored and no communication with the trachea was noted. Decision was made to close this and make a separate incision higher in the neck inferior to cricoid. The skin edges were removed and the defect was closed in a layered fashion using 4-0 Vicryl in the deep tissues and a running locking plain gut suture to close the skin. A 2 cm incision was planned horizontally in the neck 2 cm above the sternal notch in the midline. The incision was injected with 5 ml of 1% lidocaine with 1:100,000 epinephrine. The patient was prepped and draped in clean fashion. A #15 blade scalpel was then used to incise the skin and subcutaneous tissue along the planned incision. The subcutaneous tissue and strap musculature were divided through the midline raphae with Bovie electrocautery. The thyroid isthmus was  identified and also divided with Bovie electrocautery.  Education officer, museum were used to retract the soft tissues and then the patient's anterior trachea was identified and cleaned with a Kitner. The second tracheal ring was identified.  Meticulous hemostasis was achieved using bipolar cautery.  A 15 blade scalpel was used to incise the space between the first and second trachel ring and the airway was suctioned free of any mucus and blood. The endotracheal tube was withdrawn under direct visualization.  A size 6 cuffed Shiley tracheostomy tube was inserted without difficulty and the patient's airway was stabilized.  The anesthesia circuit was then connected and good gas exchange was confirmed. The tracheostomy tube was sutured in place with 2-0 Silk sutures and a trach tie. Once again, the surgical site was inspected for any active bleeding, and hemostasis was confirmed. Patient was returned to the care of anesthesia and was transferred from the operating room to the neuro ICU in stable condition.    Jason Coop, [facility]  08/11/2021

## 2021-08-12 ENCOUNTER — Other Ambulatory Visit: Payer: Self-pay

## 2021-08-12 LAB — CBC
HCT: 36.2 % — ABNORMAL LOW (ref 39.0–52.0)
Hemoglobin: 11.8 g/dL — ABNORMAL LOW (ref 13.0–17.0)
MCH: 29.4 pg (ref 26.0–34.0)
MCHC: 32.6 g/dL (ref 30.0–36.0)
MCV: 90 fL (ref 80.0–100.0)
Platelets: 333 10*3/uL (ref 150–400)
RBC: 4.02 MIL/uL — ABNORMAL LOW (ref 4.22–5.81)
RDW: 13.6 % (ref 11.5–15.5)
WBC: 8.3 10*3/uL (ref 4.0–10.5)
nRBC: 0 % (ref 0.0–0.2)

## 2021-08-12 LAB — BASIC METABOLIC PANEL
Anion gap: 8 (ref 5–15)
BUN: 22 mg/dL (ref 8–23)
CO2: 27 mmol/L (ref 22–32)
Calcium: 8.7 mg/dL — ABNORMAL LOW (ref 8.9–10.3)
Chloride: 105 mmol/L (ref 98–111)
Creatinine, Ser: 0.78 mg/dL (ref 0.61–1.24)
GFR, Estimated: >60 mL/min (ref >60)
Glucose, Bld: 251 mg/dL — ABNORMAL HIGH (ref 70–99)
Potassium: 4.3 mmol/L (ref 3.5–5.1)
Sodium: 140 mmol/L (ref 135–145)

## 2021-08-12 LAB — GLUCOSE, CAPILLARY
Glucose-Capillary: 273 mg/dL — ABNORMAL HIGH (ref 70–99)
Glucose-Capillary: 247 mg/dL — ABNORMAL HIGH (ref 70–99)
Glucose-Capillary: 261 mg/dL — ABNORMAL HIGH (ref 70–99)
Glucose-Capillary: 139 mg/dL — ABNORMAL HIGH (ref 70–99)
Glucose-Capillary: 229 mg/dL — ABNORMAL HIGH (ref 70–99)
Glucose-Capillary: 161 mg/dL — ABNORMAL HIGH (ref 70–99)

## 2021-08-12 LAB — MAGNESIUM: Magnesium: 1.8 mg/dL (ref 1.7–2.4)

## 2021-08-12 LAB — PHOSPHORUS: Phosphorus: 4.5 mg/dL (ref 2.5–4.6)

## 2021-08-12 MED ORDER — PROSOURCE TF PO LIQD
45.0000 mL | Freq: Every day | ORAL | Status: DC
  Administered 2021-08-13 – 2021-08-19 (×7): 45 mL
  Filled 2021-08-12 (×7): qty 45

## 2021-08-12 MED ORDER — INSULIN ASPART 100 UNIT/ML IJ SOLN
6.0000 [IU] | INTRAMUSCULAR | Status: DC
  Administered 2021-08-12 – 2021-08-13 (×3): 6 [IU] via SUBCUTANEOUS

## 2021-08-12 MED ORDER — ASPIRIN 81 MG PO CHEW
81.0000 mg | CHEWABLE_TABLET | Freq: Every day | ORAL | Status: DC
  Administered 2021-08-13 – 2021-11-26 (×106): 81 mg
  Filled 2021-08-12 (×106): qty 1

## 2021-08-12 MED ORDER — MAGNESIUM SULFATE 2 GM/50ML IV SOLN
2.0000 g | Freq: Once | INTRAVENOUS | Status: AC
  Administered 2021-08-12: 2 g via INTRAVENOUS
  Filled 2021-08-12: qty 50

## 2021-08-12 MED ORDER — ASPIRIN 81 MG PO CHEW: 81.0000 mg | CHEWABLE_TABLET | Freq: Every day | ORAL | Status: DC

## 2021-08-12 MED ORDER — FREE WATER
100.0000 mL | Freq: Three times a day (TID) | Status: DC
  Administered 2021-08-12 – 2021-08-15 (×8): 100 mL

## 2021-08-12 MED ORDER — TICAGRELOR 90 MG PO TABS
90.0000 mg | ORAL_TABLET | Freq: Two times a day (BID) | ORAL | Status: DC
  Administered 2021-08-13 – 2021-11-26 (×211): 90 mg
  Filled 2021-08-12 (×213): qty 1

## 2021-08-12 MED ORDER — TICAGRELOR 90 MG PO TABS
90.0000 mg | ORAL_TABLET | Freq: Two times a day (BID) | ORAL | Status: DC
Start: 2021-08-12 — End: 2021-08-12

## 2021-08-12 MED ORDER — FREE WATER
200.0000 mL | Freq: Four times a day (QID) | Status: DC
Start: 2021-08-12 — End: 2021-08-19
  Administered 2021-08-12 – 2021-08-19 (×29): 200 mL

## 2021-08-12 MED ORDER — JEVITY 1.5 CAL/FIBER PO LIQD
474.0000 mL | Freq: Three times a day (TID) | ORAL | Status: DC
  Administered 2021-08-12 – 2021-08-22 (×28): 474 mL
  Filled 2021-08-12: qty 474
  Filled 2021-08-12: qty 1000
  Filled 2021-08-12 (×3): qty 474
  Filled 2021-08-12: qty 1000
  Filled 2021-08-12 (×2): qty 474
  Filled 2021-08-12: qty 1000
  Filled 2021-08-12 (×3): qty 474
  Filled 2021-08-12: qty 1000
  Filled 2021-08-12: qty 474
  Filled 2021-08-12 (×2): qty 1000
  Filled 2021-08-12: qty 474
  Filled 2021-08-12 (×3): qty 1000
  Filled 2021-08-12: qty 474
  Filled 2021-08-12 (×2): qty 1000
  Filled 2021-08-12: qty 474
  Filled 2021-08-12: qty 1000
  Filled 2021-08-12: qty 474
  Filled 2021-08-12 (×5): qty 1000
  Filled 2021-08-12: qty 474

## 2021-08-12 NOTE — Anesthesia Postprocedure Evaluation (Signed)
Anesthesia Post Note  Patient: Joshua Donovan  Procedure(s) Performed: TRACHEOSTOMY (Neck)     Patient location during evaluation: PACU Anesthesia Type: General Level of consciousness: sedated and patient cooperative Pain management: pain level controlled Vital Signs Assessment: post-procedure vital signs reviewed and stable Respiratory status: spontaneous breathing Cardiovascular status: stable Anesthetic complications: no   No notable events documented.  Last Vitals:  Vitals:   08/12/21 1946 08/12/21 2000  BP: 139/67 134/74  Pulse: (!) 108 96  Resp: (!) 27 (!) 21  Temp:    SpO2: 97% 98%    Last Pain:  Vitals:   08/12/21 2000  TempSrc: Axillary  PainSc:                  [name]

## 2021-08-12 NOTE — Progress Notes (Signed)
Trach dressing placed under the trach plate by ENT yesterday at the time of placement. On assessment the split gauze dressing had dried blood and is stuck in place, ENT notified. Per ENT, leave the dressing in place and someone will be in tomorrow 8/4 to change it.

## 2021-08-12 NOTE — Consult Note (Signed)
   [facility] CM Inpatient Consult   08/12/2021  Nathanial Arrighi 1955/03/12 [id number]  [place] [facility] [ACO] Patient: [employer]  Primary Care Provider:  Center, [place] is listed as provider which is not a [facility] affilate [patient listed with [facility] with Drema Dallas PA-C in 2022 per [facility] rosters with [place] patient unable to confirm currently   Patient screened for readmission prevention hospitalization with initially showing as a readmission however, patient was transferred to [facility] from [facility] on 08/03/21 and now listed as ICU level of care.  Plan:  Continue to follow regarding network eligibility for [facility] team needs.  For questions contact:   Natividad Brood, RN BSN [place] [facility] Liaison  [phone] business mobile phone Toll free office [phone]  Fax number: [phone] [email or web address]

## 2021-08-12 NOTE — Progress Notes (Signed)
NAME:  Joshua Donovan, MRN:  [id number], DOB:  Feb 28, 1955, LOS: 9 ADMISSION DATE:  08/03/2021, CONSULTATION DATE:  08/04/21 REFERRING MD:  Dr. Gerhard Perches, CHIEF COMPLAINT:  CVA   History of Present Illness:  66 year old man who presented to [facility] 7/24 with right hand numbness, right LE pain and weakness.  PMHx significant for HTN, TBI with prior ICH, DM, and HLD.  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 Patient's weakness worsened to include right arm, right leg and left leg.  CTA Head/Neck revealed multiple severe intracranial stenoses, near-occlusive stenosis of the vertebrobasilar junction, occlusion of the distal left V4, moderate mid basilar stenosis, nearly occlusive bilateral PCA P2 segments, nearly occlusive distal right ACA A2 segments and bulky soft plaque or thrombus in the supraclinoid left ICA with mild to moderate right M1 stenosis.    Subsequently transferred to [facility] for further stroke care/Neuro IR evaluation 7/25.  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  Additionally, occlusion of left V4 distal to origin of PICA and patent bilateral P-comm arteries noted.  Heparin gtt and Plavix initiated.  Remains on Cleviprex gtt for strict SBP parameters.  Patient left intubated.  PCCM consulted for further ventilator management.  Patient underwent percutaneous tracheostomy placement 7/28 c/b bleeding (7/31) requiring bronchoscopic evaluation, oral reintubation and tracheostomy removal with stoma packing. ENT consulted.  Taken to OR afternoon 8/2 for tracheostomy redo by ENT.  Pertinent Medical History:  TBI with ICH, DM, HTN, HLD  Significant Hospital Events: Including procedures, antibiotic start and stop dates in addition to other pertinent events   7/24 presented to [facility], ventral medulla CVA 7/25 tx to [facility] 7/26 cerebral angiogram  with stent placement to right vertebro-basilar junction, failed extubation, left radial aline MRI brain> mild extension of stroke, now involving bilateral medial medullary, patent basilar artery and R VBJ stent  7/28 underwent bedside percutaneous tracheostomy and PEG tube placement, tolerated well 7/29 no acute issues overnight currently on SBT trial this a.m. and tolerating well, Cleviprex resumed earlier this a.m. due to severe hypertension 7/30 Hypertension persist despite adding oral agents, glucose remains elevated as well 7/31 Bleeding around trach site, bright red blood. Copious bloody secretions. Cleviprex off, SBPs 150s-160s. Basal insulin/TF coverage increased. Cefepime deescalated to ceftriaxone. CXR stable. Later in afternoon, continued peritrach bleeding. Bronchoscopic eval completed with intratracheal bleeding associated with trach. Removed, patient orally reintubated, stoma packed. 8/1 ENT consulted for trach revision. Lightly sedated. Vent full support/PRVC, given fatigue following events of yesterday. ENT attempted to evaluate trach at bedside, could not pass scope. Plan for OR 8/2. ASA/Brilinta held. 8/2 to OR for trach redo   Interim History / Subjective:  Trach redo by ENT yesterday. 6 cuffed shiley placed.  On PRVC this AM as has been on sedation overnight. Sedation just turned off to facilitate WUA and hopeful ATC  Objective:  Blood pressure 121/69, pulse 65, temperature 100.1 F (37.8 C), temperature source Axillary, resp. rate 18, height _0  (1.702 m), weight 84.4 kg, SpO2 100 %.    Vent Mode: PRVC FiO2 (%):  [40 %] 40 % Set Rate:  [18 bmp] 18 bmp Vt Set:  [520 mL] 520 mL PEEP:  [5 cmH20] 5 cmH20 Plateau Pressure:  [15 cmH20-16 cmH20] 16 cmH20   Intake/Output Summary (Last 24 hours) at 08/12/2021 0823 Last data filed at 08/12/2021 0600 Gross per 24 hour  Intake 2155.79 ml  Output 1620 ml  Net 535.79 ml    Filed Weights   08/04/21 0914 08/07/21 0500 08/08/21  1445  Weight: 83.9 kg 89.1 kg 84.4 kg   Physical Examination: General: Adult male, resting in bed, in NAD. Neuro: Sleepy but does open eyes to voice and follow basic commands. HEENT: [place]/AT. Sclerae anicteric. Trach in place. Cardiovascular: RRR, no M/R/G.  Lungs: Respirations even and unlabored.  CTA bilaterally, No W/R/R. Abdomen: BS x 4, soft, NT/ND.  Musculoskeletal: No gross deformities, no edema.  Skin: Intact, warm, no rashes.   Assessment & Plan:   Acute hypoxemic respiratory failure in the setting of stroke S/p percutaneous tracheostomy and PEG tube placement 7/28. Peritracheostomy bleeding resulting in trach removal 7/31 - reintubated from above with ETT and ultimately trach revision/redo by ENT in OR 8/2. Enterobacter pneumonia Increased tracheal secretions 7/28 resulting in BAL; empiric antibiotics started. Resp Cx with enterobacter aerogenes. Deescalated from cefepime to ceftriaxone. Trach with bleeding noted 7/31 requiring bronchoscopic evaluation, tracheal wall irritation noted and trach removed, reintubated oropharyngeallly. - Sedation off now to hopefully facilitate WUA and progression to ATC - VAP bundle - Pulmonary hygiene - Continue Cefepime x 4 more days for total of 7.  Brainstem stroke involving the ventral medulla with basilar artery stensois s/p stent placement to right vertebro-basilar junction  Severe intracranial atherosclerosis Ongoing stroke-associated quadriplegia - Management per Neuro/Stroke - SBP goal < 180 - Further imaging per Neuro - ASA/Brilinta to resume this afternoon - Seizure precautions - Neuroprotective measures: HOB > 30 degrees, normoglycemia, normothermia, electrolytes WNL - Likely will need LTACH vs. SNF at discharge, pending respiratory/trach/vent status  HTN emergency Grade 1 diastolic dysfunction seen on echocardiogram (TTE EF 60-65%, no WMA, G1DD, normal RV/ valves) History of hyperlipidemia - SBP goal < 180 - Continue home  lisinopril, Lopressor, amlodipine - ASA/Brilinta held for now - Continue statin  T2DM with hyperglycemia - Continue Levemir 16U BID - TF coverage increase from 4u q4hrs to 6u q4hrs - SSI, resistant scale  ?Hx of ETOH abuse Reported 1 beer a day. - Monitor for signs/symptoms of withdrawal - CIWA - Continue thiamine, folate, MV - Hopefully off Precedex today and can remove from [facility] (has been on mainly for vent synchrony/comfort)  Best Practice (right click and "Reselect all SmartList Selections" daily)   Diet/type: tubefeeds  DVT prophylaxis: prophylactic heparin  GI prophylaxis: PPI Lines: N/A Foley:  N/A Code Status:  full code Last date of multidisciplinary goals of care discussion: Full code. Discussed with daughter at bedside 8/1AM.  Critical care time: 30 minutes    Montey Hora, [place] - [facility] For pager details, please see AMION or use Epic chat  After 1900, please call [facility] for cross coverage needs 08/12/2021, 8:23 AM

## 2021-08-12 NOTE — Progress Notes (Signed)
Speech Language Pathology Treatment: Cognitive-Linquistic  Patient Details Name: Joshua Donovan MRN: [id number] DOB: [birth year] Today's Date: 08/12/2021 Time: 9924-2683 SLP Time Calculation (min) (ACUTE ONLY): 55 min  Assessment / Plan / Recommendation Clinical Impression  Pt alert and able to participate throughout session, though responses a little slower after an hour of work. Pt on full ventilator support, excessive oral secretions and watery eyes. SLP and OT co treatment to screen pt for ability to use and eye gaze assistive communication device. Pt able to successfully select target on a board with two choices x3, then on a row of 6 x2, then on a full board of 25 choices x2 with minimal verbal cues. He was unable to achieve a stable gaze on a board of over 40 choice at the targets were a bit too small. Pt and family both confirmed interest in seeking out a loaner device from [facility] to work with for a few months during pts acute rehabilitation. Will proceed with search.   In the meantime, SLP asked pt if he preferred to use blinks or a basic eye gaze selection for Y/N and pt selected eye gaze board. SLP instructed pt, family and RN on asking simple Y/N questions while holding up paper with Yes and NO written on it. Pt looks at choice and is also able to articulate the word Yes or no with his lips to confirm. If pt does not want to answer or the answer is I don't know or maybe, he was instructed to firmly close his eyes. Pt return demonstrated x3. Instructions posted in room. Will f/u to reinforce.    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 He  presented 08/02/21 with R foot pain and R-sided weakness. Transferred to [facility] 7/25. MRI 7/26 revealed interval expansion of previously identified ventral medullary infarct, extending posteriorly to traverse the medulla to the floor of the fourth ventricle, new scattered  small volume ischemic infarcts involving the right cerebellum as well as the  cortical aspects of the right greater than left occipital lobes, and single punctate focus of associated petechial hemorrhage at the right cerebellum. S/p stent placement to the R vertebrobasilar junction 7/26, failed extubation. Trach and PEG placed 7/28. PMH: DM, GERD, TBI with prior ICH, HLD, HTN      SLP Plan  Continue with current plan of care      Recommendations for follow up therapy are one component of a multi-disciplinary discharge planning process, led by the attending physician.  Recommendations may be updated based on patient status, additional functional criteria and insurance authorization.    Recommendations                   Plan: Continue with current plan of care          Joshua Baltimore, MA CCC-SLP  Acute Rehabilitation Services Secure Chat Preferred Office [phone]  Joshua Donovan  08/12/2021, 2:06 PM

## 2021-08-12 NOTE — TOC Initial Note (Signed)
Transition of Care [facility]) - Initial/Assessment Note    Patient Details  Name: Joshua Donovan MRN: [id number] Date of Birth: 30-Jun-1955  Transition of Care [facility]) CM/SW Contact:    Ella Bodo, RN Phone Number: 08/12/2021, 5:00 PM  Clinical Narrative:                 Pt is a 66 y.o. male who presented 08/02/21 with R foot pain and R-sided weakness. Transferred to [facility] 7/25. 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 S/p stent placement to the R vertebrobasilar junction 7/26, failed extubation. PTA, pt independent and living at home alone; he has supportive daughters/son. LTAC consult requested from MD.  Spoke with patient's daughter, Renard Matter, regarding consideration of LTAC hospital for next level of care. She states family has been thinking about this; TOC CM offered choice for LTAC hospital.  She states family would prefer patient to stay at [facility] if possible, and would prefer [facility].  She plans to discuss this with her siblings this evening.  Will refer to admissions coordinator for [facility] to check for eligibility, and follow up with daughter tomorrow.    Expected Discharge Plan: Long Term Acute Care (LTAC) Barriers to Discharge: Continued Medical Work up   Patient Goals and CMS Choice   CMS Medicare.gov Compare Post Acute Care list provided to:: Patient Represenative (must comment) (Daughter) Choice offered to / list presented to : Adult Children  Expected Discharge Plan and Services Expected Discharge Plan: Long Term Acute Care (LTAC)   Discharge Planning Services: CM Consult Post Acute Care  Choice: Long Term Acute Care (LTAC) Living arrangements for the past 2 months: Single Family Home                                      Prior Living Arrangements/Services Living arrangements for the past 2 months: Single Family Home Lives with:: Self Patient language and need for interpreter reviewed:: Yes        Need for Family Participation in Patient Care: Yes (Comment)     Criminal Activity/Legal Involvement Pertinent to Current Situation/Hospitalization: No - Comment as needed               Emotional Assessment Appearance:: Appears stated age Attitude/Demeanor/Rapport: Intubated (Following Commands or Not Following Commands) Affect (typically observed): Unable to Assess Orientation: : Oriented to Self, Oriented to Place, Oriented to Situation      Admission diagnosis:  Acute stroke of medulla oblongata [facility]) [I63.9] Patient Active Problem List   Diagnosis Date Noted   Acute respiratory failure with hypoxia ([place])    Right foot pain 08/03/2021   Alcohol abuse 08/03/2021   CVA (cerebral vascular accident) ([place]) 08/03/2021   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[place]) 08/03/2021   Dysuria 01/23/2019   Encounter for general adult medical examination with abnormal findings 01/23/2019   Type 2 diabetes mellitus with hyperglycemia, without long-term current use of insulin ([place]) 10/31/2018   Erectile dysfunction 10/31/2018   Mixed hyperlipidemia 07/10/2018   Uncontrolled type 2 diabetes mellitus with hyperglycemia ([place]) 06/10/2018   Hypertension 05/24/2018  Encounter for screening colonoscopy    Benign neoplasm of descending colon    Polyp of sigmoid colon    Rectal polyp    Closed fracture of temporal bone ([place]) 09/09/2015   Right frontal lobe punctate hemorrhage ([place]) 09/09/2015   Angioedema 07/19/2015   Malignant essential hypertension 07/19/2015   PCP:  [facility] [place]:   [place] [address], [place] [address] [place] [address] Phone: [phone] Fax: [phone]     Social Determinants of Health (SDOH) Interventions    Readmission Risk Interventions     No data to display         Reinaldo Raddle, RN, BSN  Trauma/Neuro ICU Case Manager [phone]

## 2021-08-12 NOTE — Progress Notes (Signed)
Nutrition Follow-up  DOCUMENTATION CODES:   Not applicable  INTERVENTION:   Tube Feeding via PEG:  2 cartons of Jevity 1.5 TID Pro-Source TF daily  Provides: 2170 kcal, 101 grams protein, and 1080 ml free water.   50 ml free water before and after each bolus feeding 200 ml free water bolus every 6 hours Total free water: 2180 ml    NUTRITION DIAGNOSIS:   Inadequate oral intake related to acute illness as evidenced by NPO status. Ongoing.   GOAL:   Patient will meet greater than or equal to 90% of their needs Met with TF at goal  MONITOR:   Vent status, Labs, Weight trends, TF tolerance  REASON FOR ASSESSMENT:   Consult Enteral/tube feeding initiation and management  ASSESSMENT:   66 yo male admitted post brain stem stroke involving ventral medulla requiring stent placement to right vertebro-basilar junction. PMH includes HTN, TBI, DM, HLD  Pt discussed during ICU rounds and with RN and MD.  Pt's sedation now off to facilitate ATC.  Pt with medial medullary syndrome with quadriplegia/quadriparesis   7/24 Admitted to [facility], ventral medulla CVA 7/25 Tx to [facility] 7/26 Cerebral angiogram with stent placement to right vertebro-basilar junction, failed extubation, reintubated 7/28 s/p PEG placement 8/2 s/p trach revision with ENT in the OR   Labs: CBGs 83-273 (ICU goal 140-180)  Meds: colace, folic acid, ss novolog, novolog, levemir, liquid MVI, protonix, miralax, Thiamine  Current TF:  Osmolite 1.5 at 55 ml/hr Pro-Source TF 45 mL BID This provides 2060 kcals, 111 g of protein and 1094 mL of free water  Diet Order:   Diet Order             Diet NPO time specified  Diet effective midnight                   EDUCATION NEEDS:   Not appropriate for education at this time  Skin:  Skin Assessment: Reviewed RN Assessment  Last BM:  8/1  Height:   Ht Readings from Last 1 Encounters:  08/04/21 _0  (1.702 m)    Weight:   Wt Readings from Last 1  Encounters:  08/08/21 84.4 kg    BMI:  Body mass index is 29.14 kg/m.  Estimated Nutritional Needs:   Kcal:  2000-2200 kcals  Protein:  100-115 g  Fluid:  >/= 2 L  Lacrecia Delval P., RD, LDN, CNSC See AMiON for contact information

## 2021-08-12 NOTE — Progress Notes (Signed)
STROKE TEAM PROGRESS NOTE   SUBJECTIVE (INTERVAL HISTORY)  He had tracheostomy yesterday.  There is minimal bleeding around the tracheostomy site.  Remains on ventilatory support on tracheostomy.  He is sedated on fentanyl and precedex. Neuro status unchanged. Remains quadriplegic and will open eyes to voice and follow commands.  Vital signs stable. OBJECTIVE Temp:  [97.9 F (36.6 C)-100.1 F (37.8 C)] 99.9 F (37.7 C) (08/03 1324) Pulse Rate:  [61-79] 79 (08/03 1155) Cardiac Rhythm: Normal sinus rhythm (08/03 0800) Resp:  [10-29] 23 (08/03 1155) BP: (114-155)/(66-86) 125/68 (08/03 1144) SpO2:  [94 %-100 %] 97 % (08/03 1155) FiO2 (%):  [40 %] 40 % (08/03 1155)  Recent Labs  Lab 08/11/21 1949 08/11/21 2321 08/12/21 0328 08/12/21 0732 08/12/21 1242  GLUCAP 191* 220* 273* 247* 261*   Recent Labs  Lab 08/05/21 1619 08/06/21 0512 08/06/21 0512 08/06/21 1812 08/07/21 0225 08/08/21 0227 08/09/21 0157 08/10/21 0623 08/12/21 0222  NA  --  139   < >  --  139 141 143 143 140  K  --  4.3   < >  --  3.5 3.8 3.4* 4.0 4.3  CL  --  106   < >  --  104 106 105 108 105  CO2  --  22   < >  --  '26 25 29 30 27  '$ GLUCOSE  --  216*   < >  --  290* 258* 212* 258* 251*  BUN  --  11   < >  --  '12 14 15 23 22  '$ CREATININE  --  0.77   < >  --  0.72 0.69 0.70 0.73 0.78  CALCIUM  --  8.2*   < >  --  8.7* 8.6* 9.2 9.0 8.7*  MG 3.0* 2.6*  --  2.1 2.0  --   --   --  1.8  PHOS 2.9 3.3  --  2.9 3.2  --   --   --  4.5   < > = values in this interval not displayed.   Recent Labs  Lab 08/10/21 0623  AST 30  ALT 50*  ALKPHOS 30*  BILITOT 0.5  PROT 6.0*  ALBUMIN 2.5*   Recent Labs  Lab 08/06/21 0512 08/07/21 0225 08/09/21 0157 08/10/21 0623 08/12/21 0222  WBC 9.0 8.9 9.6 7.3 8.3  NEUTROABS  --   --   --  5.0  --   HGB 12.6* 13.2 13.3 12.0* 11.8*  HCT 38.5* 40.4 41.3 37.7* 36.2*  MCV 89.1 89.2 89.4 91.5 90.0  PLT 270 291 343 325 333   No results for input(s): "CKTOTAL", "CKMB",  "CKMBINDEX", "TROPONINI" in the last 168 hours. No results for input(s): "LABPROT", "INR" in the last 72 hours. No results for input(s): "COLORURINE", "LABSPEC", "PHURINE", "GLUCOSEU", "HGBUR", "BILIRUBINUR", "KETONESUR", "PROTEINUR", "UROBILINOGEN", "NITRITE", "LEUKOCYTESUR" in the last 72 hours.  Invalid input(s): "APPERANCEUR"     Component Value Date/Time   CHOL 203 (H) 08/03/2021 0347   TRIG 175 (H) 08/03/2021 0347   HDL 52 08/03/2021 0347   CHOLHDL 3.9 08/03/2021 0347   VLDL 35 08/03/2021 0347   LDLCALC 116 (H) 08/03/2021 0347   Lab Results  Component Value Date   HGBA1C 8.3 (H) 08/03/2021   No results found for: "LABOPIA", "COCAINSCRNUR", "LABBENZ", "AMPHETMU", "THCU", "LABBARB"  No results for input(s): "ETH" in the last 168 hours.  I have personally reviewed the radiological images below and agree with the radiology interpretations.  DG CHEST PORT 1  VIEW  Result Date: 08/09/2021 CLINICAL DATA:  Intubation. EXAM: PORTABLE CHEST 1 VIEW COMPARISON:  Same day. FINDINGS: The heart size and mediastinal contours are within normal limits. Endotracheal tube is noted in grossly good position. Minimal bibasilar subsegmental atelectasis is noted. The visualized skeletal structures are unremarkable. IMPRESSION: Endotracheal tube in grossly good position. Electronically Signed   By: Marijo Conception M.D.   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 He presented with progressively worsening weakness in the right lower extremity, left lower extremity, right upper extremity, and left upper extremity. On the morning of the procedure, patient was flaccid at the right-sided  extremities and had severe weakness in the left-sided extremities (grade 1/5). 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 There was occlusion of the left vertebral artery seen distal to the origin of the PICA. 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 EXAM: 1.  Biplane right carotid and cerebral DSA angiogram. 2.  Biplane left common carotid and cerebral DSA cerebral angiogram 3. Indirect left vertebral and cerebral angiogram with tip of the catheter at the origin of the left vertebral artery 4.  Right subclavian arteriogram. 5. Selective catheterization of the right vertebral artery, biplane vertebral and cerebral DSA cerebral angiogram. 6. Selective catheterization of the right vertebral artery by using the 088 neuron max guide catheter, 5 French AXS catalyst catheter as an intermediate catheter and 021 phenom microcatheter with a 014 Arostotle microwire. 7. High magnification biplane cerebral angiogram and exchange of the microcatheter over an exchange length 014 zoom wire into an Apex 2.25 x 15 mm monorail balloon catheter and angioplasty of the severely 90% stenotic right vertebrobasilar junction. 8. Resolute onyx 3 mm x 22 mm stent placement at the site of the stenosis. 9. Post stent biplane DSA cerebral angiography of the right vertebral artery injection. 10. Right common femoral arteriogram in the RAO projection after withdrawing the sheath with its tip in the external iliac artery and arteriogram Attending: Dr. Frazier Richards Assistant: Dr. Luanne Bras COMPARISON:  CORRELATION IS MADE WITH CTA OF THE HEAD AND NECK DATED August 03, 2021 MEDICATIONS: The antibiotic was administered within 1 hour of the procedure Injection Ancef 2 g Injection Integrilin 4.5 mg at 1.5 mg increments intra-arterially  through the right vertebral artery Heparin 3500 units ANESTHESIA/SEDATION: Anesthesia was provided by the anesthesia team. Please refer to the anesthesia notes for detailed information. CONTRAST:  126 mL of Omnipaque 300 FLUOROSCOPY: Fluoroscopy Time: 48 minutes 48 seconds (3499 mGy). COMPLICATIONS: None immediate. TECHNIQUE: Informed written consent was obtained from the patient and his daughter after a thorough discussion of the procedural risks including but not limited to approximately 5% bleeding, hematoma, vascular injury, worsening of the stroke, ventilator dependency etc. Benefits and alternatives. All questions were addressed. Maximal Sterile Barrier Technique was utilized including caps, mask, sterile gowns, sterile gloves, sterile drape, hand hygiene and skin antiseptic. A timeout was performed prior to the initiation of the procedure. PROCEDURE: The right groin was prepped and draped in the usual sterile fashion. Thereafter using modified Seldinger  technique, transfemoral access into the right common femoral artery was obtained without difficulty. Over a 0.035 inch guidewire, an 8 French x 25 cm pinnacle sheath was inserted. Through this, and also over 0.035 inch roadrunner wire, a 5 [place] JB 1 catheter was advanced to the aortic arch region and selectively positioned in the right common carotid artery, and biplane DSA carotid and cerebral angiogram was obtained. Next, with the catheter tip in the left common carotid artery, biplane DSA carotid and cerebral Angiography of the head was obtained. The, selective catheterization of the left subclavian artery followed by angiogram was performed. With the tip of the catheter at the origin of the left vertebral artery, indirect left vertebral and biplane DSA cerebral angiography of left subclavian arterial injection was performed. Next, selective catheterization of the right subclavian artery was performed and the catheter was advanced into the proximal  subclavian artery. Next, the wire was exchanged for an exchange length 035 Rosen wire. The JB 1 catheter was removed and 088 neuron max guide catheter and 5 French AXS catalyst catheter were advanced and selective catheterization of the left vertebral artery followed by vertebral and cerebral angiogram was performed. Next, under high magnification angiography, and under roadmap guidance, phenom 21 microcatheter and 0 1 for restarting microwire were advanced into the left posterior cerebral artery and angiogram was performed. Next, the wire was exchanged for a 014 zoom exchange length wire. Subsequently, we selected 2.25 x 15 mm Apex monorail microcatheter and under roadmap guidance, angioplasty of the severely stenotic right vertebrobasilar artery was performed. Post angioplasty angiogram was performed. Next, the balloon was removed. Measurements were made. Next we selected 3 mm x 22 mm resolute onyx stent and the stent was deployed at the right vertebrobasilar stenotic site. The stent was gently dilated and was inflated up to 10 atmospheric pressure. Next, post stent biplane DSA cerebral angiogram was performed. At this point in time, we felt the goal of the procedure was obtained. The catheter was removed. Femoral access site was further sterilely cleaned and the femoral sheath was withdrawn with its tip in the right external iliac artery, femoral angiogram was performed in the RAO projection and the femoral access site was closed by using an 8 [place] Angio-Seal. Patient tolerated the procedure well and was transferred to the ICU intubated in stable condition. No immediate complications. FINDINGS: Right CCA: The right common carotid arteriogram demonstrates the right external carotid artery and its major branches to be widely patent. Right ICA: The right internal carotid artery at the bulb to the cranial skull base opacifies normally. No significant stenosis. Right Intracranial: The petrous and cavernous segments  are widely patent. Mild 20-30% stenosis at the supraclinoid ICA. The right middle cerebral artery and the right anterior cerebral artery opacify normally into the capillary and venous phases. Mild-to-moderate atheromatous disease with about 30% stenosis in the right M1 segment. There is severe about 70% stenosis of the right pericallosal branch of the ACA seen in the proximal aspect. There is cross filling via the anterior communicating artery seen with opacification of the left anterior cerebral artery. PCOM is patent with faint opacification of the right posterior cerebral artery. Right Sided Anterior Venous: The venous phase demonstrates preferential egress of contrast into the right transverse sinus, the sigmoid sinus and the right internal jugular vein. There is no early venous shunting into the venous structures at the skull base or intracranially into the dural sinuses. Left CCA: The left common carotid arteriogram demonstrates the left external  carotid artery and its major branches to be widely patent. Left ICA: The left internal carotid artery at the bulb to the cranial skull base opacifies normally. Mild atheromatous disease at the origin of the ICA. There is mild stenosis seen about 2 cm from the origin of the left ICA measuring 42 percent by the NASCET criteria. Left Intracranial: The petrous, cavernous and supraclinoid segments are widely patent. The left middle cerebral artery and the left anterior cerebral artery opacify normally into the capillary and venous phases. There is a prominent PCOM opacifying the bilateral posterior cerebral arteries via the distal basilar artery. Left Sided Anterior Venous: The venous phase demonstrates preferential egress of contrast into the right transverse sinus, the sigmoid sinus and the right internal jugular vein. There is opacification of the left transverse sigmoid sinuses and internal jugular veins seen as well. There is no early venous shunting into the venous  structures at the skull base or intracranially into the dural sinuses. Indirect left vertebral and cerebral angiogram with injection of the left subclavian artery demonstrate faint opacification of the left vertebral artery. 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 High magnification biplane DSA cerebral angiography of right vertebral artery injection demonstrate severe about 90% stenosis at the right vertebrobasilar junction in length of about 7 mm. Post angioplasty angiogram demonstrate significant residual stenoses. Post stent angiogram demonstrate complete expansion of the stent with no residual stenosis. There is about 40-50% stenosis seen at the mid basilar artery. Right PICA, bilateral superior cerebellar, AICA and posterior cerebral arteries show moderate atheromatous disease. No significant stenosis. IMPRESSION: 1. Right vertebral and cerebral angiogram demonstrated about 90% stenosis at the vertebrobasilar junction. Successful angioplasty and stent placement without significant residual stenosis. 2.  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 3. 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 4. Mild atheromatous disease in the right intracranial ICA with 20-30% stenosis at the supraclinoid ICA and 30% stenosis in the right M1 segment. There is about 70% stenoses of the right pericallosal branch of the ACA seen in the A3 segment. 5.  Bilateral P comms are patent with the left PCOM being prominent. PLAN: 1.  Blood pressure goals between 120-140 mm Hg. 2.  Patient to be on dual antiplatelet agents for 6 months. Electronically Signed   By: Frazier Richards M.D.   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 He presented with progressively worsening weakness in the right lower extremity, left lower extremity, right upper extremity, and left upper extremity. On the morning of the procedure, patient was flaccid at the right-sided extremities and had severe weakness in the left-sided extremities (grade 1/5). 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 There was occlusion of the left vertebral artery seen distal to the origin of the PICA. 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 EXAM: 1.  Biplane right carotid and cerebral DSA angiogram. 2.  Biplane left common carotid and cerebral DSA cerebral angiogram 3. Indirect left vertebral and cerebral angiogram with tip  of the catheter at the origin of the left vertebral artery 4.  Right subclavian arteriogram. 5. Selective catheterization of the right vertebral artery, biplane vertebral and cerebral DSA cerebral angiogram. 6. Selective catheterization of the right vertebral artery by using the 088 neuron max guide catheter, 5 French AXS catalyst catheter as an intermediate catheter and 021 phenom microcatheter with a 014 Arostotle microwire. 7. High magnification biplane cerebral angiogram and exchange of the microcatheter over an exchange length 014 zoom wire into an Apex 2.25 x 15 mm monorail balloon catheter and angioplasty of the severely 90% stenotic right vertebrobasilar junction. 8. Resolute onyx 3 mm x 22 mm stent placement at the site of the stenosis. 9. Post stent biplane DSA cerebral angiography of the right vertebral artery injection. 10. Right common femoral arteriogram in the RAO projection after withdrawing the sheath with its tip in the external iliac artery and arteriogram Attending: Dr. Frazier Richards Assistant: Dr. Luanne Bras COMPARISON:  CORRELATION IS MADE WITH CTA OF THE HEAD AND NECK DATED August 03, 2021 MEDICATIONS: The antibiotic was administered within 1 hour of the procedure Injection Ancef 2 g Injection Integrilin 4.5 mg at 1.5 mg increments intra-arterially through the right vertebral artery Heparin 3500 units ANESTHESIA/SEDATION: Anesthesia was provided by the anesthesia team. Please refer to the anesthesia notes for detailed information. CONTRAST:  126 mL of Omnipaque 300 FLUOROSCOPY: Fluoroscopy Time: 48 minutes 48 seconds (3499 mGy). COMPLICATIONS: None immediate. TECHNIQUE: Informed written consent was obtained from the patient and his daughter after a thorough discussion of the procedural risks including but not limited to approximately 5% bleeding, hematoma, vascular injury, worsening of the stroke, ventilator dependency etc. Benefits and alternatives. All questions were addressed. Maximal Sterile Barrier Technique was utilized including caps, mask, sterile gowns, sterile gloves, sterile drape, hand hygiene and skin antiseptic. A timeout was performed prior to the initiation of the procedure. PROCEDURE: The right groin was prepped and draped in the usual sterile fashion. Thereafter using modified Seldinger technique, transfemoral access into the right common femoral artery was obtained without difficulty. Over a 0.035 inch guidewire, an 8 French x 25 cm pinnacle sheath was inserted. Through this, and also over 0.035 inch roadrunner wire, a 5 [place] JB 1 catheter was advanced to the aortic arch region and selectively positioned in the right common carotid artery, and biplane DSA carotid and cerebral angiogram was obtained. Next, with the catheter tip in the left common carotid artery, biplane DSA carotid and cerebral Angiography of the head was obtained. The, selective catheterization of the left subclavian artery followed by angiogram was performed. With the tip of the catheter at  the origin of the left vertebral artery, indirect left vertebral and biplane DSA cerebral angiography of left subclavian arterial injection was performed. Next, selective catheterization of the right subclavian artery was performed and the catheter was advanced into the proximal subclavian artery. Next, the wire was exchanged for an exchange length 035 Rosen wire. The JB 1 catheter was removed and 088 neuron max guide catheter and 5 French AXS catalyst catheter were advanced and selective catheterization of the left vertebral artery followed by vertebral and cerebral angiogram was performed. Next, under high magnification angiography, and under roadmap guidance, phenom 21 microcatheter and 0 1 for restarting microwire were advanced into the left posterior cerebral artery and angiogram was performed. Next, the wire was exchanged for a 014 zoom exchange length wire. Subsequently, we selected 2.25 x 15 mm Apex monorail microcatheter and under roadmap guidance, angioplasty of  the severely stenotic right vertebrobasilar artery was performed. Post angioplasty angiogram was performed. Next, the balloon was removed. Measurements were made. Next we selected 3 mm x 22 mm resolute onyx stent and the stent was deployed at the right vertebrobasilar stenotic site. The stent was gently dilated and was inflated up to 10 atmospheric pressure. Next, post stent biplane DSA cerebral angiogram was performed. At this point in time, we felt the goal of the procedure was obtained. The catheter was removed. Femoral access site was further sterilely cleaned and the femoral sheath was withdrawn with its tip in the right external iliac artery, femoral angiogram was performed in the RAO projection and the femoral access site was closed by using an 8 [place] Angio-Seal. Patient tolerated the procedure well and was transferred to the ICU intubated in stable condition. No immediate complications. FINDINGS: Right CCA: The right common carotid  arteriogram demonstrates the right external carotid artery and its major branches to be widely patent. Right ICA: The right internal carotid artery at the bulb to the cranial skull base opacifies normally. No significant stenosis. Right Intracranial: The petrous and cavernous segments are widely patent. Mild 20-30% stenosis at the supraclinoid ICA. The right middle cerebral artery and the right anterior cerebral artery opacify normally into the capillary and venous phases. Mild-to-moderate atheromatous disease with about 30% stenosis in the right M1 segment. There is severe about 70% stenosis of the right pericallosal branch of the ACA seen in the proximal aspect. There is cross filling via the anterior communicating artery seen with opacification of the left anterior cerebral artery. PCOM is patent with faint opacification of the right posterior cerebral artery. Right Sided Anterior Venous: The venous phase demonstrates preferential egress of contrast into the right transverse sinus, the sigmoid sinus and the right internal jugular vein. There is no early venous shunting into the venous structures at the skull base or intracranially into the dural sinuses. Left CCA: The left common carotid arteriogram demonstrates the left external carotid artery and its major branches to be widely patent. Left ICA: The left internal carotid artery at the bulb to the cranial skull base opacifies normally. Mild atheromatous disease at the origin of the ICA. There is mild stenosis seen about 2 cm from the origin of the left ICA measuring 42 percent by the NASCET criteria. Left Intracranial: The petrous, cavernous and supraclinoid segments are widely patent. The left middle cerebral artery and the left anterior cerebral artery opacify normally into the capillary and venous phases. There is a prominent PCOM opacifying the bilateral posterior cerebral arteries via the distal basilar artery. Left Sided Anterior Venous: The venous phase  demonstrates preferential egress of contrast into the right transverse sinus, the sigmoid sinus and the right internal jugular vein. There is opacification of the left transverse sigmoid sinuses and internal jugular veins seen as well. There is no early venous shunting into the venous structures at the skull base or intracranially into the dural sinuses. Indirect left vertebral and cerebral angiogram with injection of the left subclavian artery demonstrate faint opacification of the left vertebral artery. 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 High magnification biplane DSA cerebral angiography of right vertebral artery injection demonstrate severe about 90% stenosis at the right vertebrobasilar junction in length of about 7 mm. Post angioplasty angiogram demonstrate significant residual stenoses. Post stent angiogram demonstrate complete expansion of the stent with no residual stenosis. There is about 40-50% stenosis seen at the mid basilar artery. Right  PICA, bilateral superior cerebellar, AICA and posterior cerebral arteries show moderate atheromatous disease. No significant stenosis. IMPRESSION: 1. Right vertebral and cerebral angiogram demonstrated about 90% stenosis at the vertebrobasilar junction. Successful angioplasty and stent placement without significant residual stenosis. 2.  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 3. 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 4. Mild atheromatous disease in the right intracranial ICA with 20-30% stenosis at the supraclinoid ICA and 30% stenosis in the right M1 segment. There is about 70% stenoses of the right pericallosal branch of the ACA seen in the A3 segment. 5.  Bilateral P comms are patent with the left PCOM being prominent. PLAN: 1.  Blood pressure goals between 120-140 mm Hg. 2.  Patient to be on dual antiplatelet agents for 6 months. Electronically Signed   By: Frazier Richards M.D.    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 He presented with progressively worsening weakness in the right lower extremity, left lower extremity, right upper extremity, and left upper extremity. On the morning of the procedure, patient was flaccid at the right-sided extremities and had severe weakness in the left-sided extremities (grade 1/5). 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 There was occlusion of the left vertebral artery seen distal to the origin of the PICA. 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 EXAM: 1.  Biplane right carotid and cerebral DSA angiogram. 2.  Biplane left common carotid and cerebral DSA cerebral angiogram 3. Indirect left vertebral and cerebral angiogram with tip of the catheter at the origin of the left vertebral artery 4.  Right subclavian arteriogram. 5. Selective catheterization of the right vertebral artery, biplane vertebral and cerebral DSA cerebral angiogram. 6. Selective catheterization of the right vertebral artery by using the 088 neuron max guide catheter, 5 French AXS catalyst catheter as an intermediate catheter and 021 phenom microcatheter with a 014 Arostotle microwire. 7. High magnification biplane cerebral angiogram and exchange of the microcatheter over an exchange length 014 zoom wire into an Apex 2.25 x 15 mm monorail balloon catheter and angioplasty of the severely 90% stenotic right vertebrobasilar junction. 8. Resolute onyx 3 mm x 22 mm  stent placement at the site of the stenosis. 9. Post stent biplane DSA cerebral angiography of the right vertebral artery injection. 10. Right common femoral arteriogram in the RAO projection after withdrawing the sheath with its tip in the external iliac artery and arteriogram Attending: Dr. Frazier Richards Assistant: Dr. Luanne Bras COMPARISON:  CORRELATION IS MADE WITH CTA OF THE HEAD AND NECK DATED August 03, 2021 MEDICATIONS: The antibiotic was administered within 1 hour of the procedure Injection Ancef 2 g Injection Integrilin 4.5 mg at 1.5 mg increments intra-arterially through the right vertebral artery Heparin 3500 units ANESTHESIA/SEDATION: Anesthesia was provided by the anesthesia team. Please refer to the anesthesia notes for detailed information. CONTRAST:  126 mL of Omnipaque 300 FLUOROSCOPY: Fluoroscopy Time: 48 minutes 48 seconds (3499 mGy). COMPLICATIONS: None immediate. TECHNIQUE: Informed written consent was obtained from the patient and  his daughter after a thorough discussion of the procedural risks including but not limited to approximately 5% bleeding, hematoma, vascular injury, worsening of the stroke, ventilator dependency etc. Benefits and alternatives. All questions were addressed. Maximal Sterile Barrier Technique was utilized including caps, mask, sterile gowns, sterile gloves, sterile drape, hand hygiene and skin antiseptic. A timeout was performed prior to the initiation of the procedure. PROCEDURE: The right groin was prepped and draped in the usual sterile fashion. Thereafter using modified Seldinger technique, transfemoral access into the right common femoral artery was obtained without difficulty. Over a 0.035 inch guidewire, an 8 French x 25 cm pinnacle sheath was inserted. Through this, and also over 0.035 inch roadrunner wire, a 5 [place] JB 1 catheter was advanced to the aortic arch region and selectively positioned in the right common carotid artery, and biplane DSA carotid  and cerebral angiogram was obtained. Next, with the catheter tip in the left common carotid artery, biplane DSA carotid and cerebral Angiography of the head was obtained. The, selective catheterization of the left subclavian artery followed by angiogram was performed. With the tip of the catheter at the origin of the left vertebral artery, indirect left vertebral and biplane DSA cerebral angiography of left subclavian arterial injection was performed. Next, selective catheterization of the right subclavian artery was performed and the catheter was advanced into the proximal subclavian artery. Next, the wire was exchanged for an exchange length 035 Rosen wire. The JB 1 catheter was removed and 088 neuron max guide catheter and 5 French AXS catalyst catheter were advanced and selective catheterization of the left vertebral artery followed by vertebral and cerebral angiogram was performed. Next, under high magnification angiography, and under roadmap guidance, phenom 21 microcatheter and 0 1 for restarting microwire were advanced into the left posterior cerebral artery and angiogram was performed. Next, the wire was exchanged for a 014 zoom exchange length wire. Subsequently, we selected 2.25 x 15 mm Apex monorail microcatheter and under roadmap guidance, angioplasty of the severely stenotic right vertebrobasilar artery was performed. Post angioplasty angiogram was performed. Next, the balloon was removed. Measurements were made. Next we selected 3 mm x 22 mm resolute onyx stent and the stent was deployed at the right vertebrobasilar stenotic site. The stent was gently dilated and was inflated up to 10 atmospheric pressure. Next, post stent biplane DSA cerebral angiogram was performed. At this point in time, we felt the goal of the procedure was obtained. The catheter was removed. Femoral access site was further sterilely cleaned and the femoral sheath was withdrawn with its tip in the right external iliac artery,  femoral angiogram was performed in the RAO projection and the femoral access site was closed by using an 8 [place] Angio-Seal. Patient tolerated the procedure well and was transferred to the ICU intubated in stable condition. No immediate complications. FINDINGS: Right CCA: The right common carotid arteriogram demonstrates the right external carotid artery and its major branches to be widely patent. Right ICA: The right internal carotid artery at the bulb to the cranial skull base opacifies normally. No significant stenosis. Right Intracranial: The petrous and cavernous segments are widely patent. Mild 20-30% stenosis at the supraclinoid ICA. The right middle cerebral artery and the right anterior cerebral artery opacify normally into the capillary and venous phases. Mild-to-moderate atheromatous disease with about 30% stenosis in the right M1 segment. There is severe about 70% stenosis of the right pericallosal branch of the ACA seen in the proximal aspect. There is cross filling  via the anterior communicating artery seen with opacification of the left anterior cerebral artery. PCOM is patent with faint opacification of the right posterior cerebral artery. Right Sided Anterior Venous: The venous phase demonstrates preferential egress of contrast into the right transverse sinus, the sigmoid sinus and the right internal jugular vein. There is no early venous shunting into the venous structures at the skull base or intracranially into the dural sinuses. Left CCA: The left common carotid arteriogram demonstrates the left external carotid artery and its major branches to be widely patent. Left ICA: The left internal carotid artery at the bulb to the cranial skull base opacifies normally. Mild atheromatous disease at the origin of the ICA. There is mild stenosis seen about 2 cm from the origin of the left ICA measuring 42 percent by the NASCET criteria. Left Intracranial: The petrous, cavernous and supraclinoid segments  are widely patent. The left middle cerebral artery and the left anterior cerebral artery opacify normally into the capillary and venous phases. There is a prominent PCOM opacifying the bilateral posterior cerebral arteries via the distal basilar artery. Left Sided Anterior Venous: The venous phase demonstrates preferential egress of contrast into the right transverse sinus, the sigmoid sinus and the right internal jugular vein. There is opacification of the left transverse sigmoid sinuses and internal jugular veins seen as well. There is no early venous shunting into the venous structures at the skull base or intracranially into the dural sinuses. Indirect left vertebral and cerebral angiogram with injection of the left subclavian artery demonstrate faint opacification of the left vertebral artery. 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 High magnification biplane DSA cerebral angiography of right vertebral artery injection demonstrate severe about 90% stenosis at the right vertebrobasilar junction in length of about 7 mm. Post angioplasty angiogram demonstrate significant residual stenoses. Post stent angiogram demonstrate complete expansion of the stent with no residual stenosis. There is about 40-50% stenosis seen at the mid basilar artery. Right PICA, bilateral superior cerebellar, AICA and posterior cerebral arteries show moderate atheromatous disease. No significant stenosis. IMPRESSION: 1. Right vertebral and cerebral angiogram demonstrated about 90% stenosis at the vertebrobasilar junction. Successful angioplasty and stent placement without significant residual stenosis. 2.  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 3. 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 4. Mild atheromatous disease in the right intracranial ICA with 20-30% stenosis at the supraclinoid ICA and 30% stenosis in the right M1 segment. There is about 70% stenoses of  the right pericallosal branch of the ACA seen in the A3 segment. 5.  Bilateral P comms are patent with the left PCOM being prominent. PLAN: 1.  Blood pressure goals between 120-140 mm Hg. 2.  Patient to be on dual antiplatelet agents for 6 months. Electronically Signed   By: Frazier Richards M.D.   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 He presented with progressively worsening weakness in the right lower extremity, left lower extremity, right upper extremity, and left upper extremity. On the morning of the procedure, patient was flaccid at the right-sided extremities and had severe weakness in the left-sided extremities (grade 1/5). CTA of the head and neck and MRI of the brain demonstrated severe stenoses at the  right vertebrobasilar junction measuring about 90%. 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 There was occlusion of the left vertebral artery seen distal to the origin of the PICA. 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 EXAM: 1.  Biplane right carotid and cerebral DSA angiogram. 2.  Biplane left common carotid and cerebral DSA cerebral angiogram 3. Indirect left vertebral and cerebral angiogram with tip of the catheter at the origin of the left vertebral artery 4.  Right subclavian arteriogram. 5. Selective catheterization of the right vertebral artery, biplane vertebral and cerebral DSA cerebral angiogram. 6. Selective catheterization of the right vertebral artery by using the 088 neuron max guide catheter, 5 French AXS catalyst catheter as an intermediate catheter and 021 phenom microcatheter with a 014 Arostotle microwire. 7. High magnification biplane  cerebral angiogram and exchange of the microcatheter over an exchange length 014 zoom wire into an Apex 2.25 x 15 mm monorail balloon catheter and angioplasty of the severely 90% stenotic right vertebrobasilar junction. 8. Resolute onyx 3 mm x 22 mm stent placement at the site of the stenosis. 9. Post stent biplane DSA cerebral angiography of the right vertebral artery injection. 10. Right common femoral arteriogram in the RAO projection after withdrawing the sheath with its tip in the external iliac artery and arteriogram Attending: Dr. Frazier Richards Assistant: Dr. Luanne Bras COMPARISON:  CORRELATION IS MADE WITH CTA OF THE HEAD AND NECK DATED August 03, 2021 MEDICATIONS: The antibiotic was administered within 1 hour of the procedure Injection Ancef 2 g Injection Integrilin 4.5 mg at 1.5 mg increments intra-arterially through the right vertebral artery Heparin 3500 units ANESTHESIA/SEDATION: Anesthesia was provided by the anesthesia team. Please refer to the anesthesia notes for detailed information. CONTRAST:  126 mL of Omnipaque 300 FLUOROSCOPY: Fluoroscopy Time: 48 minutes 48 seconds (3499 mGy). COMPLICATIONS: None immediate. TECHNIQUE: Informed written consent was obtained from the patient and his daughter after a thorough discussion of the procedural risks including but not limited to approximately 5% bleeding, hematoma, vascular injury, worsening of the stroke, ventilator dependency etc. Benefits and alternatives. All questions were addressed. Maximal Sterile Barrier Technique was utilized including caps, mask, sterile gowns, sterile gloves, sterile drape, hand hygiene and skin antiseptic. A timeout was performed prior to the initiation of the procedure. PROCEDURE: The right groin was prepped and draped in the usual sterile fashion. Thereafter using modified Seldinger technique, transfemoral access into the right common femoral artery was obtained without difficulty. Over a 0.035 inch guidewire, an 8  French x 25 cm pinnacle sheath was inserted. Through this, and also over 0.035 inch roadrunner wire, a 5 [place] JB 1 catheter was advanced to the aortic arch region and selectively positioned in the right common carotid artery, and biplane DSA carotid and cerebral angiogram was obtained. Next, with the catheter tip in the left common carotid artery, biplane DSA carotid and cerebral Angiography of the head was obtained. The, selective catheterization of the left subclavian artery followed by angiogram was performed. With the tip of the catheter at the origin of the left vertebral artery, indirect left vertebral and biplane DSA cerebral angiography of left subclavian arterial injection was performed. Next, selective catheterization of the right subclavian artery was performed and the catheter was advanced into the proximal subclavian artery. Next, the wire was exchanged for an exchange length 035 Rosen wire. The JB 1 catheter was removed and 088 neuron max guide catheter and 5 [place] AXS  catalyst catheter were advanced and selective catheterization of the left vertebral artery followed by vertebral and cerebral angiogram was performed. Next, under high magnification angiography, and under roadmap guidance, phenom 21 microcatheter and 0 1 for restarting microwire were advanced into the left posterior cerebral artery and angiogram was performed. Next, the wire was exchanged for a 014 zoom exchange length wire. Subsequently, we selected 2.25 x 15 mm Apex monorail microcatheter and under roadmap guidance, angioplasty of the severely stenotic right vertebrobasilar artery was performed. Post angioplasty angiogram was performed. Next, the balloon was removed. Measurements were made. Next we selected 3 mm x 22 mm resolute onyx stent and the stent was deployed at the right vertebrobasilar stenotic site. The stent was gently dilated and was inflated up to 10 atmospheric pressure. Next, post stent biplane DSA cerebral angiogram  was performed. At this point in time, we felt the goal of the procedure was obtained. The catheter was removed. Femoral access site was further sterilely cleaned and the femoral sheath was withdrawn with its tip in the right external iliac artery, femoral angiogram was performed in the RAO projection and the femoral access site was closed by using an 8 [place] Angio-Seal. Patient tolerated the procedure well and was transferred to the ICU intubated in stable condition. No immediate complications. FINDINGS: Right CCA: The right common carotid arteriogram demonstrates the right external carotid artery and its major branches to be widely patent. Right ICA: The right internal carotid artery at the bulb to the cranial skull base opacifies normally. No significant stenosis. Right Intracranial: The petrous and cavernous segments are widely patent. Mild 20-30% stenosis at the supraclinoid ICA. The right middle cerebral artery and the right anterior cerebral artery opacify normally into the capillary and venous phases. Mild-to-moderate atheromatous disease with about 30% stenosis in the right M1 segment. There is severe about 70% stenosis of the right pericallosal branch of the ACA seen in the proximal aspect. There is cross filling via the anterior communicating artery seen with opacification of the left anterior cerebral artery. PCOM is patent with faint opacification of the right posterior cerebral artery. Right Sided Anterior Venous: The venous phase demonstrates preferential egress of contrast into the right transverse sinus, the sigmoid sinus and the right internal jugular vein. There is no early venous shunting into the venous structures at the skull base or intracranially into the dural sinuses. Left CCA: The left common carotid arteriogram demonstrates the left external carotid artery and its major branches to be widely patent. Left ICA: The left internal carotid artery at the bulb to the cranial skull base opacifies  normally. Mild atheromatous disease at the origin of the ICA. There is mild stenosis seen about 2 cm from the origin of the left ICA measuring 42 percent by the NASCET criteria. Left Intracranial: The petrous, cavernous and supraclinoid segments are widely patent. The left middle cerebral artery and the left anterior cerebral artery opacify normally into the capillary and venous phases. There is a prominent PCOM opacifying the bilateral posterior cerebral arteries via the distal basilar artery. Left Sided Anterior Venous: The venous phase demonstrates preferential egress of contrast into the right transverse sinus, the sigmoid sinus and the right internal jugular vein. There is opacification of the left transverse sigmoid sinuses and internal jugular veins seen as well. There is no early venous shunting into the venous structures at the skull base or intracranially into the dural sinuses. Indirect left vertebral and cerebral angiogram with injection of the left subclavian artery demonstrate  faint opacification of the left vertebral artery. 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 High magnification biplane DSA cerebral angiography of right vertebral artery injection demonstrate severe about 90% stenosis at the right vertebrobasilar junction in length of about 7 mm. Post angioplasty angiogram demonstrate significant residual stenoses. Post stent angiogram demonstrate complete expansion of the stent with no residual stenosis. There is about 40-50% stenosis seen at the mid basilar artery. Right PICA, bilateral superior cerebellar, AICA and posterior cerebral arteries show moderate atheromatous disease. No significant stenosis. IMPRESSION: 1. Right vertebral and cerebral angiogram demonstrated about 90% stenosis at the vertebrobasilar junction. Successful angioplasty and stent placement without significant residual stenosis. 2.  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 3. Occlusion  of the left V4 segment immediately distal to the origin of the PICA. 4. Mild atheromatous disease in the right intracranial ICA with 20-30% stenosis at the supraclinoid ICA and 30% stenosis in the right M1 segment. There is about 70% stenoses of the right pericallosal branch of the ACA seen in the A3 segment. 5.  Bilateral P comms are patent with the left PCOM being prominent. PLAN: 1.  Blood pressure goals between 120-140 mm Hg. 2.  Patient to be on dual antiplatelet agents for 6 months. Electronically Signed   By: Frazier Richards M.D.   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 He presented with progressively worsening weakness in the right lower extremity, left lower extremity, right upper extremity, and left upper extremity. On the morning of the procedure, patient was flaccid at the right-sided extremities and had severe weakness in the left-sided extremities (grade 1/5). 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 There was occlusion of the left vertebral artery seen distal to the origin of the PICA. 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 EXAM: 1.  Biplane right carotid and cerebral DSA angiogram. 2.  Biplane left common carotid and cerebral DSA cerebral angiogram 3. Indirect left vertebral and cerebral angiogram with tip of the catheter at the origin of the left vertebral artery 4.  Right subclavian arteriogram. 5. Selective catheterization of the right vertebral artery, biplane vertebral and cerebral DSA cerebral  angiogram. 6. Selective catheterization of the right vertebral artery by using the 088 neuron max guide catheter, 5 French AXS catalyst catheter as an intermediate catheter and 021 phenom microcatheter with a 014 Arostotle microwire. 7. High magnification biplane cerebral angiogram and exchange of the microcatheter over an exchange length 014 zoom wire into an Apex 2.25 x 15 mm monorail balloon catheter and angioplasty of the severely 90% stenotic right vertebrobasilar junction. 8. Resolute onyx 3 mm x 22 mm stent placement at the site of the stenosis. 9. Post stent biplane DSA cerebral angiography of the right vertebral artery injection. 10. Right common femoral arteriogram in the RAO projection after withdrawing the sheath with its tip in the external iliac artery and arteriogram Attending: Dr. Frazier Richards Assistant: Dr. Luanne Bras COMPARISON:  CORRELATION IS MADE WITH CTA OF THE HEAD AND NECK DATED August 03, 2021 MEDICATIONS: The antibiotic was  administered within 1 hour of the procedure Injection Ancef 2 g Injection Integrilin 4.5 mg at 1.5 mg increments intra-arterially through the right vertebral artery Heparin 3500 units ANESTHESIA/SEDATION: Anesthesia was provided by the anesthesia team. Please refer to the anesthesia notes for detailed information. CONTRAST:  126 mL of Omnipaque 300 FLUOROSCOPY: Fluoroscopy Time: 48 minutes 48 seconds (3499 mGy). COMPLICATIONS: None immediate. TECHNIQUE: Informed written consent was obtained from the patient and his daughter after a thorough discussion of the procedural risks including but not limited to approximately 5% bleeding, hematoma, vascular injury, worsening of the stroke, ventilator dependency etc. Benefits and alternatives. All questions were addressed. Maximal Sterile Barrier Technique was utilized including caps, mask, sterile gowns, sterile gloves, sterile drape, hand hygiene and skin antiseptic. A timeout was performed prior to the initiation of the  procedure. PROCEDURE: The right groin was prepped and draped in the usual sterile fashion. Thereafter using modified Seldinger technique, transfemoral access into the right common femoral artery was obtained without difficulty. Over a 0.035 inch guidewire, an 8 French x 25 cm pinnacle sheath was inserted. Through this, and also over 0.035 inch roadrunner wire, a 5 [place] JB 1 catheter was advanced to the aortic arch region and selectively positioned in the right common carotid artery, and biplane DSA carotid and cerebral angiogram was obtained. Next, with the catheter tip in the left common carotid artery, biplane DSA carotid and cerebral Angiography of the head was obtained. The, selective catheterization of the left subclavian artery followed by angiogram was performed. With the tip of the catheter at the origin of the left vertebral artery, indirect left vertebral and biplane DSA cerebral angiography of left subclavian arterial injection was performed. Next, selective catheterization of the right subclavian artery was performed and the catheter was advanced into the proximal subclavian artery. Next, the wire was exchanged for an exchange length 035 Rosen wire. The JB 1 catheter was removed and 088 neuron max guide catheter and 5 [place] AXS catalyst catheter were advanced and selective catheterization of the left vertebral artery followed by vertebral and cerebral angiogram was performed. Next, under high magnification angiography, and under roadmap guidance, phenom 21 microcatheter and 0 1 for restarting microwire were advanced into the left posterior cerebral artery and angiogram was performed. Next, the wire was exchanged for a 014 zoom exchange length wire. Subsequently, we selected 2.25 x 15 mm Apex monorail microcatheter and under roadmap guidance, angioplasty of the severely stenotic right vertebrobasilar artery was performed. Post angioplasty angiogram was performed. Next, the balloon was removed.  Measurements were made. Next we selected 3 mm x 22 mm resolute onyx stent and the stent was deployed at the right vertebrobasilar stenotic site. The stent was gently dilated and was inflated up to 10 atmospheric pressure. Next, post stent biplane DSA cerebral angiogram was performed. At this point in time, we felt the goal of the procedure was obtained. The catheter was removed. Femoral access site was further sterilely cleaned and the femoral sheath was withdrawn with its tip in the right external iliac artery, femoral angiogram was performed in the RAO projection and the femoral access site was closed by using an 8 [place] Angio-Seal. Patient tolerated the procedure well and was transferred to the ICU intubated in stable condition. No immediate complications. FINDINGS: Right CCA: The right common carotid arteriogram demonstrates the right external carotid artery and its major branches to be widely patent. Right ICA: The right internal carotid artery at the bulb to the cranial skull base opacifies normally.  No significant stenosis. Right Intracranial: The petrous and cavernous segments are widely patent. Mild 20-30% stenosis at the supraclinoid ICA. The right middle cerebral artery and the right anterior cerebral artery opacify normally into the capillary and venous phases. Mild-to-moderate atheromatous disease with about 30% stenosis in the right M1 segment. There is severe about 70% stenosis of the right pericallosal branch of the ACA seen in the proximal aspect. There is cross filling via the anterior communicating artery seen with opacification of the left anterior cerebral artery. PCOM is patent with faint opacification of the right posterior cerebral artery. Right Sided Anterior Venous: The venous phase demonstrates preferential egress of contrast into the right transverse sinus, the sigmoid sinus and the right internal jugular vein. There is no early venous shunting into the venous structures at the skull  base or intracranially into the dural sinuses. Left CCA: The left common carotid arteriogram demonstrates the left external carotid artery and its major branches to be widely patent. Left ICA: The left internal carotid artery at the bulb to the cranial skull base opacifies normally. Mild atheromatous disease at the origin of the ICA. There is mild stenosis seen about 2 cm from the origin of the left ICA measuring 42 percent by the NASCET criteria. Left Intracranial: The petrous, cavernous and supraclinoid segments are widely patent. The left middle cerebral artery and the left anterior cerebral artery opacify normally into the capillary and venous phases. There is a prominent PCOM opacifying the bilateral posterior cerebral arteries via the distal basilar artery. Left Sided Anterior Venous: The venous phase demonstrates preferential egress of contrast into the right transverse sinus, the sigmoid sinus and the right internal jugular vein. There is opacification of the left transverse sigmoid sinuses and internal jugular veins seen as well. There is no early venous shunting into the venous structures at the skull base or intracranially into the dural sinuses. Indirect left vertebral and cerebral angiogram with injection of the left subclavian artery demonstrate faint opacification of the left vertebral artery. 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 High magnification biplane DSA cerebral angiography of right vertebral artery injection demonstrate severe about 90% stenosis at the right vertebrobasilar junction in length of about 7 mm. Post angioplasty angiogram demonstrate significant residual stenoses. Post stent angiogram demonstrate complete expansion of the stent with no residual stenosis. There is about 40-50% stenosis seen at the mid basilar artery. Right PICA, bilateral superior cerebellar, AICA and posterior cerebral arteries show moderate atheromatous disease. No  significant stenosis. IMPRESSION: 1. Right vertebral and cerebral angiogram demonstrated about 90% stenosis at the vertebrobasilar junction. Successful angioplasty and stent placement without significant residual stenosis. 2.  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 3. 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 4. Mild atheromatous disease in the right intracranial ICA with 20-30% stenosis at the supraclinoid ICA and 30% stenosis in the right M1 segment. There is about 70% stenoses of the right pericallosal branch of the ACA seen in the A3 segment. 5.  Bilateral P comms are patent with the left PCOM being prominent. PLAN: 1.  Blood pressure goals between 120-140 mm Hg. 2.  Patient to be on dual antiplatelet agents for 6 months. Electronically Signed   By: Frazier Richards M.D.   On: 08/09/2021 10:26   DG CHEST PORT 1 VIEW  Result Date: 08/09/2021 CLINICAL DATA:  Tracheostomy dependent. EXAM: PORTABLE CHEST 1 VIEW COMPARISON:  08/06/2021 FINDINGS: The tracheostomy tube  tip is above the carina. Heart size and mediastinal contours are stable. Mild left lung base atelectasis with volume loss is stable from the previous exam. Right lung is clear. IMPRESSION: Stable exam. Electronically Signed   By: Kerby Moors M.D.   On: 08/09/2021 08:47   DG Chest Port 1 View  Result Date: 08/06/2021 CLINICAL DATA:  Status post tracheostomy EXAM: PORTABLE CHEST 1 VIEW COMPARISON:  None Available. FINDINGS: Tracheostomy tube with tip in the proximal trachea in appropriate position. New mild LEFT basilar atelectasis. Mild central venous congestion. IMPRESSION: Tracheostomy tube in good position. New LEFT basilar atelectasis. Electronically Signed   By: Suzy Bouchard M.D.   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 He presented with progressively worsening weakness in the right lower extremity, left lower extremity, right upper extremity, and left upper extremity. On the morning of the procedure, patient was flaccid at the right-sided extremities and had severe weakness in the left-sided extremities (grade 1/5). 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 There was occlusion of the left vertebral artery seen distal to the origin of the PICA. 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 EXAM: 1.  Biplane right carotid and cerebral DSA angiogram. 2.  Biplane left common carotid and cerebral DSA cerebral angiogram 3. Indirect left vertebral and cerebral angiogram with tip of the catheter at the origin of the left vertebral artery 4.  Right subclavian arteriogram. 5. Selective catheterization of the right vertebral artery, biplane vertebral and cerebral DSA cerebral angiogram. 6. Selective catheterization of the right vertebral artery by using the 088 neuron max guide catheter, 5 French AXS catalyst catheter as an intermediate catheter and 021 phenom microcatheter with a 014 [place] microwire. 7. High magnification biplane cerebral angiogram and exchange of the microcatheter over an exchange length 014 zoom wire into an Apex 2.25 x 15 mm monorail balloon catheter and angioplasty of the severely 90% stenotic right vertebrobasilar junction. 8. Resolute onyx 3 mm x 22 mm stent placement at the site of the stenosis. 9. Post stent biplane DSA cerebral angiography of the right vertebral artery injection. 10. Right common femoral arteriogram in the RAO projection after withdrawing the sheath with its tip in the external iliac  artery and arteriogram Attending: Dr. Frazier Richards Assistant: Dr. Luanne Bras COMPARISON:  CORRELATION IS MADE WITH CTA OF THE HEAD AND NECK DATED August 03, 2021 MEDICATIONS: The antibiotic was administered within 1 hour of the procedure Injection Ancef 2 g Injection Integrilin 4.5 mg at 1.5 mg increments intra-arterially through the right vertebral artery Heparin 3500 units ANESTHESIA/SEDATION: Anesthesia was provided by the anesthesia team. Please refer to the anesthesia notes for detailed information. CONTRAST:  126 mL of Omnipaque 300 FLUOROSCOPY: Fluoroscopy Time: 48 minutes 48 seconds (3499 mGy). COMPLICATIONS: None immediate. TECHNIQUE: Informed written consent was obtained from the patient and his daughter after a thorough discussion of the procedural risks including but not limited to approximately 5% bleeding, hematoma, vascular injury, worsening of the stroke, ventilator dependency etc. Benefits and alternatives. All questions were addressed. Maximal Sterile Barrier Technique was utilized including caps,  mask, sterile gowns, sterile gloves, sterile drape, hand hygiene and skin antiseptic. A timeout was performed prior to the initiation of the procedure. PROCEDURE: The right groin was prepped and draped in the usual sterile fashion. Thereafter using modified Seldinger technique, transfemoral access into the right common femoral artery was obtained without difficulty. Over a 0.035 inch guidewire, an 8 French x 25 cm pinnacle sheath was inserted. Through this, and also over 0.035 inch roadrunner wire, a 5 [place] JB 1 catheter was advanced to the aortic arch region and selectively positioned in the right common carotid artery, and biplane DSA carotid and cerebral angiogram was obtained. Next, with the catheter tip in the left common carotid artery, biplane DSA carotid and cerebral Angiography of the head was obtained. The, selective catheterization of the left subclavian artery followed by angiogram was  performed. With the tip of the catheter at the origin of the left vertebral artery, indirect left vertebral and biplane DSA cerebral angiography of left subclavian arterial injection was performed. Next, selective catheterization of the right subclavian artery was performed and the catheter was advanced into the proximal subclavian artery. Next, the wire was exchanged for an exchange length 035 Rosen wire. The JB 1 catheter was removed and 088 neuron max guide catheter and 5 French AXS catalyst catheter were advanced and selective catheterization of the left vertebral artery followed by vertebral and cerebral angiogram was performed. Next, under high magnification angiography, and under roadmap guidance, phenom 21 microcatheter and 0 1 for restarting microwire were advanced into the left posterior cerebral artery and angiogram was performed. Next, the wire was exchanged for a 014 zoom exchange length wire. Subsequently, we selected 2.25 x 15 mm Apex monorail microcatheter and under roadmap guidance, angioplasty of the severely stenotic right vertebrobasilar artery was performed. Post angioplasty angiogram was performed. Next, the balloon was removed. Measurements were made. Next we selected 3 mm x 22 mm resolute onyx stent and the stent was deployed at the right vertebrobasilar stenotic site. The stent was gently dilated and was inflated up to 10 atmospheric pressure. Next, post stent biplane DSA cerebral angiogram was performed. At this point in time, we felt the goal of the procedure was obtained. The catheter was removed. Femoral access site was further sterilely cleaned and the femoral sheath was withdrawn with its tip in the right external iliac artery, femoral angiogram was performed in the RAO projection and the femoral access site was closed by using an 8 [place] Angio-Seal. Patient tolerated the procedure well and was transferred to the ICU intubated in stable condition. No immediate complications. FINDINGS:  Right CCA: The right common carotid arteriogram demonstrates the right external carotid artery and its major branches to be widely patent. Right ICA: The right internal carotid artery at the bulb to the cranial skull base opacifies normally. No significant stenosis. Right Intracranial: The petrous and cavernous segments are widely patent. Mild 20-30% stenosis at the supraclinoid ICA. The right middle cerebral artery and the right anterior cerebral artery opacify normally into the capillary and venous phases. Mild-to-moderate atheromatous disease with about 30% stenosis in the right M1 segment. There is severe about 70% stenosis of the right pericallosal branch of the ACA seen in the proximal aspect. There is cross filling via the anterior communicating artery seen with opacification of the left anterior cerebral artery. PCOM is patent with faint opacification of the right posterior cerebral artery. Right Sided Anterior Venous: The venous phase demonstrates preferential egress of contrast into the right transverse sinus,  the sigmoid sinus and the right internal jugular vein. There is no early venous shunting into the venous structures at the skull base or intracranially into the dural sinuses. Left CCA: The left common carotid arteriogram demonstrates the left external carotid artery and its major branches to be widely patent. Left ICA: The left internal carotid artery at the bulb to the cranial skull base opacifies normally. Mild atheromatous disease at the origin of the ICA. There is mild stenosis seen about 2 cm from the origin of the left ICA measuring 42 percent by the NASCET criteria. Left Intracranial: The petrous, cavernous and supraclinoid segments are widely patent. The left middle cerebral artery and the left anterior cerebral artery opacify normally into the capillary and venous phases. There is a prominent PCOM opacifying the bilateral posterior cerebral arteries via the distal basilar artery. Left Sided  Anterior Venous: The venous phase demonstrates preferential egress of contrast into the right transverse sinus, the sigmoid sinus and the right internal jugular vein. There is opacification of the left transverse sigmoid sinuses and internal jugular veins seen as well. There is no early venous shunting into the venous structures at the skull base or intracranially into the dural sinuses. Indirect left vertebral and cerebral angiogram with injection of the left subclavian artery demonstrate faint opacification of the left vertebral artery. 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 High magnification biplane DSA cerebral angiography of right vertebral artery injection demonstrate severe about 90% stenosis at the right vertebrobasilar junction in length of about 7 mm. Post angioplasty angiogram demonstrate significant residual stenoses. Post stent angiogram demonstrate complete expansion of the stent with no residual stenosis. There is about 40-50% stenosis seen at the mid basilar artery. Right PICA, bilateral superior cerebellar, AICA and posterior cerebral arteries show moderate atheromatous disease. No significant stenosis. IMPRESSION: 1. Right vertebral and cerebral angiogram demonstrated about 90% stenosis at the vertebrobasilar junction. Successful angioplasty and stent placement without significant residual stenosis. 2.  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 3. 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 4. Mild atheromatous disease in the right intracranial ICA with 20-30% stenosis at the supraclinoid ICA and 30% stenosis in the right M1 segment. There is about 70% stenoses of the right pericallosal branch of the ACA seen in the A3 segment. 5.  Bilateral P comms are patent with the left PCOM being prominent. PLAN: 1.  Blood pressure goals between 120-140 mm Hg. 2.  Patient to be on dual antiplatelet agents for 6 months. Electronically  Signed   By: Frazier Richards M.D.   On: 08/05/2021 14:38   MR BRAIN WO CONTRAST  Result Date: 08/05/2021 CLINICAL DATA:  66 year old male with history of known medullary infarct and severe vertebrobasilar disease, status post catheter directed revascularization and stenting. EXAM: MRI HEAD WITHOUT CONTRAST MRA HEAD WITHOUT CONTRAST TECHNIQUE: Multiplanar, multi-echo pulse sequences of the brain and surrounding structures were acquired without intravenous contrast. Angiographic images of the Circle of Willis were acquired using MRA technique without intravenous contrast. COMPARISON:  Comparison made with arteriogram from earlier the same day as well as multiple previous studies. FINDINGS: MRI HEAD FINDINGS Brain: There has been interval expansion of previously identified ventral medullary infarct, now extending posteriorly to traversed the entirety of the medulla to the floor of the fourth ventricle (series 5, images 63, 64, 65), slightly worse on the left. Additionally, there are new scattered patchy ischemic infarcts involving the right cerebellum.  Few additional patchy small volume ischemic infarcts noted involving the cortical aspects of the right greater than left occipital lobes (series 5, images 79, 77, 75, 73, 72 no significant associated mass effect. Chronic right cerebellar microhemorrhage noted. There is a new small focus of susceptibility artifact within the adjacent right cerebellum, likely a punctate focus of petechial blood products (series 14, image 20). No other associated blood products. Remainder the brain is otherwise stable in appearance. Stable cerebral volume with chronic small vessel ischemic disease. No new focal parenchymal signal abnormality. Pituitary gland and suprasellar region remain within normal limits. Midline encephalomalacia with chronic hemosiderin staining at the anterior right frontal lobe again noted. Remote lacunar infarcts again noted about the cerebellum, pons, thalami,  and left posterior corona radiata. No mass lesion, significant mass effect, or midline shift. Stable ventricular size without hydrocephalus. No extra-axial fluid collection. Vascular: Susceptibility artifact now seen about the vertebrobasilar junction related to interval stenting. Major intracranial vascular flow voids are maintained. Skull and upper cervical spine: Craniocervical junction within normal limits. Bone marrow signal intensity normal. No new scalp soft tissue abnormality. Sinuses/Orbits: Globes and orbital soft tissues within normal limits. Moderate mucosal thickening throughout the paranasal sinuses with fluid within the nasopharynx. Patient is intubated. Mastoid air cells remain largely clear. Other: None. MRA HEAD FINDINGS Anterior circulation: Visualized distal cervical segments of the internal carotid arteries remain patent with antegrade flow. Petrous segments remain widely patent. Atheromatous irregularity about both carotid siphons with associated moderate stenosis at the supraclinoid left ICA. A1 segments patent. Normal anterior communicating artery complex. Left ACA remains patent. Distal severe right A2/A3 stenosis noted (series 1, image 164), stable. Left M1 segment remains patent. Mild to moderate long segment right M1 stenosis again noted. Negative MCA bifurcations. No proximal MCA branch occlusion. Distal MCA branches remain perfused and symmetric, although demonstrate mild small vessel atheromatous irregularity. Posterior circulation: Heterogeneous atheromatous irregularity again noted about both proximal V4 segments as they course into the cranial vault. Multifocal moderate stenoses involving the proximal left V4 segment again noted. Left V4 remains patent to the takeoff of the left PICA which remains patent and well perfused. Occlusion of the distal left V4 segment beyond the left PICA again noted, stable. On the right, there has been interval placement of a vascular stent extending  from the distal right V4 segment across the vertebrobasilar junction into the proximal basilar artery. This traverses the previously seen high-grade stenosis at this level. Grossly patent flow through the stent, although evaluation for possible intra stent stenosis limited by MRA. Stable and fairly robust flow seen within the basilar artery distally. Suspected short-segment fenestration noted at the distal basilar artery. Superior cerebellar arteries remain patent. Right PCA primarily supplied via the basilar. Left PCA supplied via the basilar as well as a robust left posterior communicating artery. Atheromatous disease involving both PCAs with associated severe bilateral P2 stenoses. PCAs remain patent to their distal aspects although demonstrate small vessel atheromatous irregularity. Anatomic variants: None significant. IMPRESSION: MRI HEAD IMPRESSION: 1. 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 Additionally, there are new scattered small volume ischemic infarcts involving the right cerebellum as well as the cortical aspects of the right greater than left occipital lobes. No associated mass effect. Single punctate focus of associated petechial hemorrhage at the right cerebellum as above. 2. Otherwise stable appearance of the brain with atrophy, chronic microvascular ischemic disease, and multiple chronic ischemic  infarcts as above. MRA HEAD IMPRESSION: 1. Interval placement of a vascular stent extending from the distal right V4 segment across the vertebrobasilar junction into the proximal basilar artery. Grossly patent flow through the stent, although evaluation for possible intra stent stenosis is limited by MRA. Stable and fairly robust flow seen within the basilar artery distally. 2. Otherwise stable appearance of the intracranial circulation with moderate multifocal stenoses involving the  proximal left V4 segment with distal left V4 occlusion, severe right A2/A3 stenosis, severe bilateral P2 stenoses, moderate supraclinoid left ICA stenosis, with mild to moderate long segment right M1 stenosis. Electronically Signed   By: Jeannine Boga M.D.   On: 08/05/2021 02:44   MR ANGIO HEAD WO CONTRAST  Result Date: 08/05/2021 CLINICAL DATA:  66 year old male with history of known medullary infarct and severe vertebrobasilar disease, status post catheter directed revascularization and stenting. EXAM: MRI HEAD WITHOUT CONTRAST MRA HEAD WITHOUT CONTRAST TECHNIQUE: Multiplanar, multi-echo pulse sequences of the brain and surrounding structures were acquired without intravenous contrast. Angiographic images of the Circle of Willis were acquired using MRA technique without intravenous contrast. COMPARISON:  Comparison made with arteriogram from earlier the same day as well as multiple previous studies. FINDINGS: MRI HEAD FINDINGS Brain: There has been interval expansion of previously identified ventral medullary infarct, now extending posteriorly to traversed the entirety of the medulla to the floor of the fourth ventricle (series 5, images 63, 64, 65), slightly worse on the left. Additionally, there are new scattered patchy ischemic infarcts involving the right cerebellum. Few additional patchy small volume ischemic infarcts noted involving the cortical aspects of the right greater than left occipital lobes (series 5, images 79, 77, 75, 73, 72 no significant associated mass effect. Chronic right cerebellar microhemorrhage noted. There is a new small focus of susceptibility artifact within the adjacent right cerebellum, likely a punctate focus of petechial blood products (series 14, image 20). No other associated blood products. Remainder the brain is otherwise stable in appearance. Stable cerebral volume with chronic small vessel ischemic disease. No new focal parenchymal signal abnormality. Pituitary  gland and suprasellar region remain within normal limits. Midline encephalomalacia with chronic hemosiderin staining at the anterior right frontal lobe again noted. Remote lacunar infarcts again noted about the cerebellum, pons, thalami, and left posterior corona radiata. No mass lesion, significant mass effect, or midline shift. Stable ventricular size without hydrocephalus. No extra-axial fluid collection. Vascular: Susceptibility artifact now seen about the vertebrobasilar junction related to interval stenting. Major intracranial vascular flow voids are maintained. Skull and upper cervical spine: Craniocervical junction within normal limits. Bone marrow signal intensity normal. No new scalp soft tissue abnormality. Sinuses/Orbits: Globes and orbital soft tissues within normal limits. Moderate mucosal thickening throughout the paranasal sinuses with fluid within the nasopharynx. Patient is intubated. Mastoid air cells remain largely clear. Other: None. MRA HEAD FINDINGS Anterior circulation: Visualized distal cervical segments of the internal carotid arteries remain patent with antegrade flow. Petrous segments remain widely patent. Atheromatous irregularity about both carotid siphons with associated moderate stenosis at the supraclinoid left ICA. A1 segments patent. Normal anterior communicating artery complex. Left ACA remains patent. Distal severe right A2/A3 stenosis noted (series 1, image 164), stable. Left M1 segment remains patent. Mild to moderate long segment right M1 stenosis again noted. Negative MCA bifurcations. No proximal MCA branch occlusion. Distal MCA branches remain perfused and symmetric, although demonstrate mild small vessel atheromatous irregularity. Posterior circulation: Heterogeneous atheromatous irregularity again noted about both proximal V4 segments as they course into  the cranial vault. Multifocal moderate stenoses involving the proximal left V4 segment again noted. Left V4 remains  patent to the takeoff of the left PICA which remains patent and well perfused. Occlusion of the distal left V4 segment beyond the left PICA again noted, stable. On the right, there has been interval placement of a vascular stent extending from the distal right V4 segment across the vertebrobasilar junction into the proximal basilar artery. This traverses the previously seen high-grade stenosis at this level. Grossly patent flow through the stent, although evaluation for possible intra stent stenosis limited by MRA. Stable and fairly robust flow seen within the basilar artery distally. Suspected short-segment fenestration noted at the distal basilar artery. Superior cerebellar arteries remain patent. Right PCA primarily supplied via the basilar. Left PCA supplied via the basilar as well as a robust left posterior communicating artery. Atheromatous disease involving both PCAs with associated severe bilateral P2 stenoses. PCAs remain patent to their distal aspects although demonstrate small vessel atheromatous irregularity. Anatomic variants: None significant. IMPRESSION: MRI HEAD IMPRESSION: 1. 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 Additionally, there are new scattered small volume ischemic infarcts involving the right cerebellum as well as the cortical aspects of the right greater than left occipital lobes. No associated mass effect. Single punctate focus of associated petechial hemorrhage at the right cerebellum as above. 2. Otherwise stable appearance of the brain with atrophy, chronic microvascular ischemic disease, and multiple chronic ischemic infarcts as above. MRA HEAD IMPRESSION: 1. Interval placement of a vascular stent extending from the distal right V4 segment across the vertebrobasilar junction into the proximal basilar artery. Grossly patent flow through the stent, although evaluation  for possible intra stent stenosis is limited by MRA. Stable and fairly robust flow seen within the basilar artery distally. 2. Otherwise stable appearance of the intracranial circulation with moderate multifocal stenoses involving the proximal left V4 segment with distal left V4 occlusion, severe right A2/A3 stenosis, severe bilateral P2 stenoses, moderate supraclinoid left ICA stenosis, with mild to moderate long segment right M1 stenosis. Electronically Signed   By: Jeannine Boga M.D.   On: 08/05/2021 02:44   DG Abd 1 View  Result Date: 08/04/2021 CLINICAL DATA:  NG tube placement EXAM: ABDOMEN - 1 VIEW COMPARISON:  10/11/2012 FINDINGS: Tip of NG tube is seen in the region of the antrum of the stomach. Bowel gas pattern is nonspecific. Pelvis is not included in its entirety. IMPRESSION: Tip of NG tube is seen in the stomach. Electronically Signed   By: Elmer Picker M.D.   On: 08/04/2021 17:32   DG Chest Port 1 View  Result Date: 08/04/2021 CLINICAL DATA:  Intubated EXAM: PORTABLE CHEST 1 VIEW COMPARISON:  08/04/2021 FINDINGS: Endotracheal tube tip is about 4.7 cm superior to the carina. Subsegmental atelectasis at the left base. Cardiomegaly. No consolidation, pleural effusion or pneumothorax IMPRESSION: 1. Endotracheal tube tip about 4.7 cm superior to the carina. 2. Cardiomegaly.  Subsegmental atelectasis at the left base. Electronically Signed   By: Donavan Foil M.D.   On: 08/04/2021 15:10   DG Chest Port 1 View  Result Date: 08/04/2021 CLINICAL DATA:  Endotracheal intubation EXAM: PORTABLE CHEST 1 VIEW COMPARISON:  Earlier same day FINDINGS: Endotracheal tube tip 6 cm above the carina. Cardiomegaly persists. Pulmonary venous hypertension, possibly with early interstitial edema. Right lateral chest not included on the image. No evidence of collapse or effusion. IMPRESSION: Endotracheal  tube tip 6 cm above the carina. Electronically Signed   By: Nelson Chimes M.D.   On: 08/04/2021 13:42    ECHOCARDIOGRAM COMPLETE  Result Date: 08/04/2021    ECHOCARDIOGRAM REPORT   Patient Name:   SHAYE LAGACE Date of Exam: 08/03/2021 Medical Rec #:  [id number]   Height:       67.0 in Accession #:    [id number]  Weight:       185.0 lb Date of Birth:  1955-05-08   BSA:          1.956 m Patient Age:    66 years    BP:           142/79 mmHg Patient Gender: M           HR:           76 bpm. Exam Location:  [facility] Procedure: 2D Echo, Cardiac Doppler and Color Doppler Indications:     I63.9 Stroke  History:         Patient has no prior history of Echocardiogram examinations.                  Risk Factors:Diabetes, Dyslipidemia, Hypertension and Former                  Smoker.  Sonographer:     Rosalia Hammers Referring Phys:  [id number] Athena Masse Diagnosing Phys: [place]  1. Left ventricular ejection fraction, by estimation, is 60 to 65%. The left ventricle has normal function. The left ventricle has no regional wall motion abnormalities. There is moderate concentric left ventricular hypertrophy. Left ventricular diastolic parameters are consistent with Grade I diastolic dysfunction (impaired relaxation).  2. Right ventricular systolic function is normal. The right ventricular size is normal.  3. The mitral valve is normal in structure. Trivial mitral valve regurgitation. No evidence of mitral stenosis.  4. The aortic valve is normal in structure. Aortic valve regurgitation is mild. No aortic stenosis is present.  5. The inferior vena cava is normal in size with greater than 50% respiratory variability, suggesting right atrial pressure of 3 mmHg. FINDINGS  Left Ventricle: Left ventricular ejection fraction, by estimation, is 60 to 65%. The left ventricle has normal function. The left ventricle has no regional wall motion abnormalities. The left ventricular internal cavity size was normal in size. There is  moderate concentric left ventricular hypertrophy. Left ventricular diastolic parameters are consistent  with Grade I diastolic dysfunction (impaired relaxation). Right Ventricle: The right ventricular size is normal. No increase in right ventricular wall thickness. Right ventricular systolic function is normal. Left Atrium: Left atrial size was normal in size. Right Atrium: Right atrial size was normal in size. Pericardium: There is no evidence of pericardial effusion. Mitral Valve: The mitral valve is normal in structure. Trivial mitral valve regurgitation. No evidence of mitral valve stenosis. Tricuspid Valve: The tricuspid valve is normal in structure. Tricuspid valve regurgitation is trivial. No evidence of tricuspid stenosis. Aortic Valve: The aortic valve is normal in structure. Aortic valve regurgitation is mild. Aortic regurgitation PHT measures 1577 msec. No aortic stenosis is present. Aortic valve mean gradient measures 5.0 mmHg. Aortic valve peak gradient measures 9.1 mmHg. Aortic valve area, by VTI measures 2.30 cm. Pulmonic Valve: The pulmonic valve was normal in structure. Pulmonic valve regurgitation is not visualized. No evidence of pulmonic stenosis. Aorta: The aortic root is normal in size and structure. Venous: The inferior vena cava is normal in size with greater  than 50% respiratory variability, suggesting right atrial pressure of 3 mmHg. IAS/Shunts: No atrial level shunt detected by color flow Doppler.  LEFT VENTRICLE PLAX 2D LVIDd:         3.84 cm   Diastology LVIDs:         2.17 cm   LV e' medial:    6.39 cm/s LV PW:         1.65 cm   LV E/e' medial:  9.6 LV IVS:        1.18 cm   LV e' lateral:   7.83 cm/s LVOT diam:     1.90 cm   LV E/e' lateral: 7.8 LV SV:         74 LV SV Index:   38 LVOT Area:     2.84 cm  RIGHT VENTRICLE RV Basal diam:  3.15 cm RV S prime:     17.40 cm/s TAPSE (M-mode): 2.4 cm LEFT ATRIUM             Index        RIGHT ATRIUM           Index LA diam:        3.20 cm 1.64 cm/m   RA Area:     18.10 cm LA Vol (A2C):   72.4 ml 37.01 ml/m  RA Volume:   47.10 ml  24.08 ml/m  LA Vol (A4C):   65.8 ml 33.63 ml/m LA Biplane Vol: 69.3 ml 35.42 ml/m  AORTIC VALVE AV Area (Vmax):    2.10 cm AV Area (Vmean):   2.06 cm AV Area (VTI):     2.30 cm AV Vmax:           151.00 cm/s AV Vmean:          100.000 cm/s AV VTI:            0.323 m AV Peak Grad:      9.1 mmHg AV Mean Grad:      5.0 mmHg LVOT Vmax:         112.00 cm/s LVOT Vmean:        72.600 cm/s LVOT VTI:          0.262 m LVOT/AV VTI ratio: 0.81 AI PHT:            1577 msec  AORTA Ao Root diam: 3.20 cm MITRAL VALVE MV Area (PHT): 3.20 cm     SHUNTS MV Decel Time: 237 msec     Systemic VTI:  0.26 m MV E velocity: 61.30 cm/s   Systemic Diam: 1.90 cm MV A velocity: 101.00 cm/s MV E/A ratio:  0.61 Shaukat Khan Electronically signed by Neoma Laming Signature Date/Time: 08/04/2021/8:55:17 AM    Final    MR BRAIN WO CONTRAST  Result Date: 08/03/2021 CLINICAL DATA:  66 year old male with severe intracranial atherosclerosis and questionable medullary ischemia on brain MRI yesterday. EXAM: MRI HEAD WITHOUT CONTRAST TECHNIQUE: Multiplanar, multiecho pulse sequences of the brain and surrounding structures were obtained without intravenous contrast. COMPARISON:  CTA head and neck this morning.  Brain MRI yesterday. FINDINGS: Brain: More convincing appearance of ventral medullary restricted diffusion, asymmetrically greater to the left of midline. See series 5, image 12, series 7, images 18 and 19. Mild if any associated T2 and FLAIR hyperintensity there. And no other convincing diffusion restriction despite the CTA findings earlier today. 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 Chronic encephalomalacia right inferior frontal gyrus with hemosiderin.  Occasional chronic microhemorrhages elsewhere, right cerebellum (series 13, image 19). No new signal abnormality compared to yesterday. No midline shift, mass effect, evidence of mass lesion, ventriculomegaly, extra-axial collection or acute  intracranial hemorrhage. Cervicomedullary junction and pituitary are within normal limits. Vascular: Major intracranial vascular flow voids are stable from yesterday. See also CTA today reported separately. Skull and upper cervical spine: Negative. Visualized bone marrow signal is within normal limits. Sinuses/Orbits: Stable, negative. Other: Visible internal auditory structures appear normal. Mastoids are clear. Negative visible scalp and face. IMPRESSION: 1. Positive for ischemia/infarction of the ventral Medulla as suspected yesterday, slightly greater on the left. No associated hemorrhage or mass effect. 2. 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 These results were communicated to Dr. Rory Percy at 11:06 am on 08/03/2021 by text page via the [facility] messaging system. Electronically Signed   By: Genevie Ann M.D.   On: 08/03/2021 11:06   CT ANGIO HEAD NECK W WO CM  Addendum Date: 08/03/2021   ADDENDUM REPORT: 08/03/2021 09:17 ADDENDUM: Study discussed by telephone with Dr. Amie Portland on 08/03/2021 at 0911 hours. Electronically Signed   By: Genevie Ann M.D.   On: 08/03/2021 09:17   Result Date: 08/03/2021 CLINICAL DATA:  66 year old male with neurologic deficit. Questionable small vessel ischemia of the medulla on brain MRI yesterday. EXAM: CT ANGIOGRAPHY HEAD AND NECK TECHNIQUE: Multidetector CT imaging of the head and neck was performed using the standard protocol during bolus administration of intravenous contrast. Multiplanar CT image reconstructions and MIPs were obtained to evaluate the vascular anatomy. Carotid stenosis measurements (when applicable) are obtained utilizing NASCET criteria, using the distal internal carotid diameter as the denominator. RADIATION DOSE REDUCTION: This exam was performed according to the departmental dose-optimization program which includes  automated exposure control, adjustment of the mA and/or kV according to patient size and/or use of iterative reconstruction technique. CONTRAST:  33m OMNIPAQUE IOHEXOL 350 MG/ML SOLN COMPARISON:  Brain MRI yesterday.  Head CT yesterday. FINDINGS: CT HEAD Brain: Bulky Calcified atherosclerosis at the skull base. Stable CT appearance of the brainstem since yesterday. No acute intracranial hemorrhage identified. No midline shift, mass effect, or evidence of intracranial mass lesion. Chronic right inferior frontal gyrus encephalomalacia, patchy bilateral cerebral white matter hypodensity more pronounced on the left. 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 No ventriculomegaly or acute cortically based infarct. Calvarium and skull base: No acute osseous abnormality identified. Paranasal sinuses: Visualized paranasal sinuses and mastoids are stable and well aerated. Orbits: Visualized orbits and scalp soft tissues are within normal limits. CTA NECK Skeleton: Periapical dental lucency bilaterally. Ordinary cervical spine degeneration. No acute osseous abnormality identified. Upper chest: Centrilobular and paraseptal emphysema. No superior mediastinal lymphadenopathy. Other neck: 15 mm hypodense right thyroid nodule. Recommend thyroid [place](ref: J Am Coll Radiol. 2015 Feb;12(2): 143-50).Dermal and subcutaneous probable 2.6 cm sebaceous cyst of the left submandibular face (series 13, image 79). Otherwise negative CT appearance of the neck soft tissues. Aortic arch: 3 vessel arch configuration. Mild arch atherosclerosis. Right carotid system: Negative brachiocephalic artery and proximal right CCA. Retropharyngeal course of the right carotid bifurcation with mild calcified plaque. Tortuous right ICA distal to the bulb, no stenosis. Left carotid system: Similar mild tortuosity and atherosclerosis with no hemodynamically significant stenosis. Vertebral arteries: Tortuous proximal right subclavian artery with  a mildly kinked appearance in the superior mediastinum. Normal right vertebral artery origin. Patent right vertebral  artery to the skull base with no significant plaque or stenosis. Mild to moderate soft and calcified plaque in the proximal left subclavian artery with less than 50 % stenosis with respect to the distal vessel. Mild atherosclerosis and stenosis at the left vertebral artery origin on series 13, image 159. Non dominant left vertebral artery than is patent to the skull base with no additional plaque or stenosis. CTA HEAD Posterior circulation: Bilateral V4 segment calcified plaque. Severe stenosis on the left (series 12, image 139) upstream of the left PICA origin which remains patent. But the left V4 segment is occluded distal to the left PICA. Contralateral right V4 moderate tandem stenoses upstream of the patent right PICA origin on series 12, image 130. And severe stenosis, near occlusion of the right vertebrobasilar junction (series 16, image 26). The basilar artery remains patent but is moderately stenotic in the mid basilar segment on series 16, image 24. Distal basilar remains patent along with SCA and PCA origins. Left posterior communicating artery is present, the right is diminutive or absent. a mild to moderate stenosis left PCA origin. And bilateral P2 segment severe stenosis, near occlusion on series 15, image 53. But there is preserved distal PCA branch enhancement bilaterally. Anterior circulation: Both ICA siphons are patent. However, on the left there is bulky soft plaque or thrombus in the supraclinoid ICA (series 14, image 162) this is between normal appearing left ophthalmic and posterior communicating artery origins. Left carotid terminus remains patent. Contralateral right ICA siphon with mild to moderate calcified plaque and mild supraclinoid stenosis. Patent carotid termini, MCA and ACA origins. Tortuous A1 segments. Normal anterior communicating artery. Proximal A2 segments are  normal. There is severe right ACA distal A2 segment stenosis, near occlusion on series 17, image 32. Left MCA M1 segment is mildly irregular. Left MCA bifurcation is patent without stenosis. Left MCA branches are mildly irregular. Mild to moderate somewhat long segment right MCA M1 stenosis along 6 mm series 16, image 20. Distal M1 and right MCA trifurcation remain patent. Right MCA branches are mildly irregular. Venous sinuses: Early contrast timing, grossly patent. Anatomic variants: Mildly dominant right vertebral artery. Review of the MIP images confirms the above findings IMPRESSION: 1. 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 - Moderate stenosis Mid Basilar Artery. - Near Occlusion bilateral PCA P2 segments, distal right ACA A2. - bulky soft plaque or thrombus in the Supraclinoid Left ICA, with up to Moderate stenosis. - mild-to-moderate Right MCA M1 stenosis. 2. Stable CT appearance of the brain since yesterday. No new intracranial abnormality. 3. Aortic Atherosclerosis (ICD10-I70.0) and Emphysema (ICD10-J43.9). Electronically Signed: By: Genevie Ann M.D. On: 08/03/2021 09:01   US Carotid Bilateral (at [facility] and [facility] only)  Result Date: 08/03/2021 CLINICAL DATA:  Follow-up examination for stroke. EXAM: BILATERAL CAROTID DUPLEX ULTRASOUND TECHNIQUE: Pearline Cables scale imaging, color Doppler and duplex ultrasound were performed of bilateral carotid and vertebral arteries in the neck. COMPARISON:  Prior Study from 04/03/2014. FINDINGS: Criteria: Quantification of carotid stenosis is based on velocity parameters that correlate the residual internal carotid diameter with NASCET-based stenosis levels, using the diameter of the distal internal carotid lumen as the denominator for stenosis measurement. The following velocity measurements were obtained: RIGHT ICA: 84/10 cm/sec CCA: 92/11  cm/sec SYSTOLIC ICA/CCA RATIO:  1.1 ECA: 64 cm/sec LEFT ICA: 64/16 cm/sec CCA: 941/74 cm/sec SYSTOLIC ICA/CCA RATIO:  0.9 ECA: 117 cm/sec RIGHT  CAROTID ARTERY: Mild smooth intimal thickening seen within the visualized right CCA without significant stenosis. Mild heterogeneous mural based echogenic plaque seen about the right carotid bulb. No significant elevation in peak systolic velocity to suggest hemodynamically significant greater than 50% stenosis. Visualized right ICA patent distally without stenosis. RIGHT VERTEBRAL ARTERY:  Patent with antegrade flow. LEFT CAROTID ARTERY: Diffuse smooth intimal thickening seen within the visualized left CCA without significant stenosis. Mild scattered heterogeneous echogenic plaque about the left carotid bulb/proximal left ICA. No significant elevation in peak systolic velocity to suggest hemodynamically significant greater than 50% stenosis. Visualized left ICA patent distally without visible stenosis. LEFT VERTEBRAL ARTERY:  Patent with antegrade flow. IMPRESSION: 1. Mild atherosclerotic plaque within the carotid bulbs and proximal ICAs bilaterally, but no hemodynamically significant greater than 50% stenosis. 2. Patent antegrade flow within both vertebral arteries. Electronically Signed   By: Jeannine Boga M.D.   On: 08/03/2021 03:26   MR Cervical Spine Wo Contrast  Result Date: 08/03/2021 CLINICAL DATA:  Initial evaluation for ataxia, right-sided numbness. EXAM: MRI CERVICAL SPINE WITHOUT CONTRAST TECHNIQUE: Multiplanar, multisequence MR imaging of the cervical spine was performed. No intravenous contrast was administered. COMPARISON:  None available. FINDINGS: Alignment: Examination degraded by motion artifact. Straightening with mild reversal of the normal cervical lordosis. No listhesis. Vertebrae: Vertebral body height maintained without acute or chronic fracture. Bone marrow signal intensity within normal limits. No discrete or worrisome osseous lesions  or abnormal marrow edema. Cord: Normal signal and morphology. No convincing cord signal changes on this motion degraded exam. Posterior Fossa, vertebral arteries, paraspinal tissues: Unremarkable. Disc levels: C2-C3: Unremarkable. C3-C4: Mild disc bulge with endplate and uncovertebral spurring. Flattening and partial effacement of the ventral thecal sac with resultant mild spinal stenosis. Moderate bilateral C4 foraminal narrowing. C4-C5: Mild disc bulge with uncovertebral spurring. Mild flattening of the ventral thecal sac without significant spinal stenosis. Moderate right with mild left C5 foraminal stenosis. C5-C6: Minimal disc bulge with uncovertebral spurring. No spinal stenosis. Mild right C6 foraminal narrowing. Left neural foramen remains patent. C6-C7: Minimal annular disc bulge. No spinal stenosis. Foramina remain patent. C7-T1:  Unremarkable. Visualized upper thoracic spine demonstrates no significant finding. IMPRESSION: 1. Motion degraded exam. 2. No acute abnormality within the cervical spine. 3. Mild multilevel cervical spondylosis with resultant mild spinal stenosis at C3-4. Associated moderate bilateral C4 foraminal stenosis, with mild right C5 and C6 foraminal narrowing. Electronically Signed   By: Jeannine Boga M.D.   On: 08/03/2021 01:04   MR BRAIN WO CONTRAST  Result Date: 08/03/2021 CLINICAL DATA:  Initial evaluation for neuro deficit, stroke suspected. EXAM: MRI HEAD WITHOUT CONTRAST TECHNIQUE: Multiplanar, multiecho pulse sequences of the brain and surrounding structures were obtained without intravenous contrast. COMPARISON:  Prior CT from earlier the same day as well as previous MRI from 09/17/2015. FINDINGS: Brain: Generalized age-related cerebral atrophy. Patchy T2/FLAIR hyperintensity involving the periventricular and deep white matter both cerebral hemispheres as well as the pons, most consistent with chronic small vessel ischemic disease, moderately advanced in nature. Area  of encephalomalacia of involving parasagittal anterior/inferior right frontal lobe noted at site of prior hemorrhage. Additional smaller foci of encephalomalacia involving the inferior temporal poles bilaterally likely posttraumatic in nature. Few scattered small remote left cerebellar infarcts noted. Subtle area of diffusion signal abnormality involving the ventral medulla measuring up to approximately 8 mm is seen (a series 5, image 12). While this finding could potentially be artifactual, possibly due to prominent vascular calcifications about the adjacent vertebral arteries,  a possible small acute ischemic infarct could be considered. No associated hemorrhage or mass effect. No other evidence for acute or subacute ischemia. No acute intracranial hemorrhage. Single chronic microhemorrhage noted within the right cerebellum, of doubtful significance in isolation. No mass lesion, midline shift or mass effect. No hydrocephalus or extra-axial fluid collection. Pituitary gland and suprasellar region within normal limits. Vascular: Major intracranial vascular flow voids are maintained. Skull and upper cervical spine: Craniocervical junction within normal limits. Bone marrow signal intensity normal. No scalp soft tissue abnormality. Sinuses/Orbits: Globes and orbital soft tissues within normal limits. Paranasal sinuses are largely clear. No mastoid effusion. Other: None. IMPRESSION: 1. 8 mm focus of diffusion signal abnormality involving the ventral medulla, indeterminate. While this finding could potentially be artifactual in nature, a possible small acute ischemic infarct is difficult to exclude, and could be considered in the correct clinical setting. No associated hemorrhage or mass effect. 2. No other acute intracranial abnormality. 3. Underlying atrophy with moderate chronic small vessel ischemic disease with a few small remote left cerebellar infarcts. Area of encephalomalacia of involving the anterior/inferior  right frontal lobe at site of previous hemorrhage. Electronically Signed   By: Jeannine Boga M.D.   On: 08/03/2021 00:59   CT Head Wo Contrast  Result Date: 08/02/2021 CLINICAL DATA:  New right upper extremity tremor. Tingling to the arm. EXAM: CT HEAD WITHOUT CONTRAST TECHNIQUE: Contiguous axial images were obtained from the base of the skull through the vertex without intravenous contrast. RADIATION DOSE REDUCTION: This exam was performed according to the departmental dose-optimization program which includes automated exposure control, adjustment of the mA and/or kV according to patient size and/or use of iterative reconstruction technique. COMPARISON:  MRI brain 09/17/2015 FINDINGS: Brain: Mild cerebral atrophy. Mild ventricular dilatation consistent with central atrophy. Patchy low-attenuation changes in the deep white matter consistent small vessel ischemic change. Old area of encephalomalacia in the right anterior frontal lobe corresponding to previous intraparenchymal hemorrhage on prior MRI. No acute mass effect or midline shift. No abnormal extra-axial fluid collections. Gray-white matter junctions are distinct. Basal cisterns are not effaced. No acute intracranial hemorrhage. Vascular: No hyperdense vessel or unexpected calcification. Skull: Normal. Negative for fracture or focal lesion. Sinuses/Orbits: Paranasal sinuses and mastoid air cells are clear. Other: None. IMPRESSION: No acute intracranial abnormalities. Chronic atrophy and small vessel ischemic changes. 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 Electronically Signed   By: Lucienne Capers M.D.   On: 08/02/2021 21:20   DG Foot Complete Right  Result Date: 08/02/2021 CLINICAL DATA:  Chronic RIGHT arm and leg pain for since Sunday, RIGHT leg cold to touch, swelling RIGHT great toe EXAM: RIGHT FOOT COMPLETE - 3+ VIEW COMPARISON:  None Available. FINDINGS: Osseous mineralization normal. Degenerative changes at first MTP joint with joint  space narrowing and spur formation. Remaining joint spaces preserved. No acute fracture, dislocation, or bone destruction. Small plantar calcaneal spur. IMPRESSION: Degenerative changes first MTP joint and small plantar calcaneal spur. No acute osseous abnormalities. Electronically Signed   By: Lavonia Dana M.D.   On: 08/02/2021 20:51     PHYSICAL EXAM  Temp:  [97.9 F (36.6 C)-100.1 F (37.8 C)] 99.9 F (37.7 C) (08/03 1324) Pulse Rate:  [61-79] 79 (08/03 1155) Resp:  [10-29] 23 (08/03 1155) BP: (114-155)/(66-86) 125/68 (08/03 1144) SpO2:  [94 %-100 %] 97 % (08/03 1155) FiO2 (%):  [40 %] 40 % (08/03 1155)  General - Well nourished, well developed, middle-aged African-American male on Precedex, Fentanyl drip and mechanical ventilator in  the ICU. Respiratory- ETT in place  Neuro -ETT tube in place - Eyes open spontaneously and to voice. Following midline commands, eyes in mid position but will track throughout visual fields. No movement in all extremities with painful stimuli. Dense quadriplegia and cannot move extremities.  ASSESSMENT/PLAN Joshua Donovan is a 66 y.o. male with history of remote TBI with ICH, hypertension, hyperlipidemia, diabetes admitted for right leg/foot weakness and pain, right hand numbness, imbalance.  Gradually developed left leg weakness also during admission.  No tPA given due to outside window.  Quadriplegia on examination, attempt at extubation failed with immediate reintubation. Trach and PEG done 7/28. Significant bleeding from trach with bronchoscopy and oral reintubation on 7/31. Trach revision planned for 8/2. 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 Held for trach revision and OR today.  Ongoing aggressive stroke risk factor management Therapy recommendations: SNF vs. 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 Grossly patent flow through the stent, although evaluation for possible intra stent stenosis is limited by MRA  Respiratory failure Reintubated right after extubation Trach 7/28 Weaning on ventilator- PCCM is primary Concern for evolving pneumonia on 5-day course of ceftriaxone Intratracheal bleeding associated with trach 7/31, reintubated 2/2 inability to pass tracheostomy tube through stoma ENT trach revision planned for 8/2  Diabetes HgbA1c 8.3 goal < 7.0 Controlled CBG monitoring SSI DM education and close PCP follow up  Hypertension Stable BP goal < 180/105 Discontinued Cleviprex On home Norvasc 10 mg Previously documented allergy to Lisinopril with facial swelling, Lisinopril discontinued Metoprolol increased to '25mg'$  BID, HCTZ reinitiated with improved renal function, hydralazine PRN added for further blood pressure control Continue clonidine 0.'1mg'$  TID for BP control Long term BP goal normotensive  Hyperlipidemia Home meds: Lipitor 20 LDL 116, goal < 70 Now on Lipitor 80 Continue  statin at discharge  Other Stroke Risk Factors Advanced age Former cigarette smoker quit smoking 33 years ago ETOH use, 1 drink per day  Other Active Problems Remote TBI with traumatic ICH  Continue ventilatory support and wean to trach collar as tolerated as per critical care team for respiratory failure.   No family available at the bedside for discussion.  Discussed with Dr. Lynetta Mare critical care medicine.This patient is critically ill and at significant risk of neurological worsening, death and care requires constant monitoring of vital signs, hemodynamics,respiratory and cardiac monitoring, extensive review of multiple databases, frequent neurological assessment, discussion with family, other specialists and medical decision making of high complexity.I have made  any additions or clarifications directly to the above note.This critical care time does not reflect procedure time, or teaching time or supervisory time of PA/NP/Med Resident etc but could involve care discussion time.  I spent 30  minutes of neurocritical care time  in the care of  this patient.      Antony Contras, MD Medical Director [place] Pager: [phone] 08/12/2021 1:30 PM  To contact Stroke Continuity provider, please refer to [email or web address] After hours, contact General Neurology

## 2021-08-12 NOTE — Progress Notes (Signed)
[facility] ADULT ICU REPLACEMENT PROTOCOL   The patient does apply for the [facility] Adult ICU Electrolyte Replacment Protocol based on the criteria listed below:   1.Exclusion criteria: TCTS patients, ECMO patients, and Dialysis patients 2. Is GFR >/= 30 ml/min? Yes.    Patient's GFR today is >60 3. Is SCr </= 2? Yes.   Patient's SCr is 0.78 mg/dL 4. Did SCr increase >/= 0.5 in 24 hours? No. 5.Pt's weight >40kg  Yes.   6. Abnormal electrolyte(s):   Mg 1.8  7. Electrolytes replaced per protocol 8.  Call MD STAT for K+ </= 2.5, Phos </= 1, or Mag </= 1 Physician:  Lowella Bandy R Dillian Feig 08/12/2021 5:26 AM

## 2021-08-13 LAB — GLUCOSE, CAPILLARY
Glucose-Capillary: 126 mg/dL — ABNORMAL HIGH (ref 70–99)
Glucose-Capillary: 202 mg/dL — ABNORMAL HIGH (ref 70–99)
Glucose-Capillary: 273 mg/dL — ABNORMAL HIGH (ref 70–99)
Glucose-Capillary: 299 mg/dL — ABNORMAL HIGH (ref 70–99)
Glucose-Capillary: 348 mg/dL — ABNORMAL HIGH (ref 70–99)

## 2021-08-13 LAB — MAGNESIUM: Magnesium: 1.8 mg/dL (ref 1.7–2.4)

## 2021-08-13 LAB — CBC
HCT: 38 % — ABNORMAL LOW (ref 39.0–52.0)
Hemoglobin: 12.7 g/dL — ABNORMAL LOW (ref 13.0–17.0)
MCH: 28.9 pg (ref 26.0–34.0)
MCHC: 33.4 g/dL (ref 30.0–36.0)
MCV: 86.4 fL (ref 80.0–100.0)
Platelets: 407 10*3/uL — ABNORMAL HIGH (ref 150–400)
RBC: 4.4 MIL/uL (ref 4.22–5.81)
RDW: 13.5 % (ref 11.5–15.5)
WBC: 11.2 10*3/uL — ABNORMAL HIGH (ref 4.0–10.5)
nRBC: 0 % (ref 0.0–0.2)

## 2021-08-13 LAB — BASIC METABOLIC PANEL
Anion gap: 10 (ref 5–15)
BUN: 15 mg/dL (ref 8–23)
CO2: 27 mmol/L (ref 22–32)
Calcium: 8.8 mg/dL — ABNORMAL LOW (ref 8.9–10.3)
Chloride: 104 mmol/L (ref 98–111)
Creatinine, Ser: 0.72 mg/dL (ref 0.61–1.24)
GFR, Estimated: >60 mL/min (ref >60)
Glucose, Bld: 136 mg/dL — ABNORMAL HIGH (ref 70–99)
Potassium: 3.4 mmol/L — ABNORMAL LOW (ref 3.5–5.1)
Sodium: 141 mmol/L (ref 135–145)

## 2021-08-13 LAB — PHOSPHORUS: Phosphorus: 3 mg/dL (ref 2.5–4.6)

## 2021-08-13 MED ORDER — INSULIN DETEMIR 100 UNIT/ML ~~LOC~~ SOLN
20.0000 [IU] | Freq: Two times a day (BID) | SUBCUTANEOUS | Status: DC
  Administered 2021-08-13 – 2021-08-14 (×3): 20 [IU] via SUBCUTANEOUS
  Filled 2021-08-13 (×5): qty 0.2

## 2021-08-13 MED ORDER — INSULIN ASPART 100 UNIT/ML IJ SOLN
0.0000 [IU] | Freq: Three times a day (TID) | INTRAMUSCULAR | Status: DC
  Administered 2021-08-13 (×2): 11 [IU] via SUBCUTANEOUS
  Administered 2021-08-14: 7 [IU] via SUBCUTANEOUS
  Administered 2021-08-14 (×2): 15 [IU] via SUBCUTANEOUS
  Administered 2021-08-15: 7 [IU] via SUBCUTANEOUS
  Administered 2021-08-15 (×2): 11 [IU] via SUBCUTANEOUS
  Administered 2021-08-16: 4 [IU] via SUBCUTANEOUS
  Administered 2021-08-16: 7 [IU] via SUBCUTANEOUS
  Administered 2021-08-16: 11 [IU] via SUBCUTANEOUS
  Administered 2021-08-17: 4 [IU] via SUBCUTANEOUS
  Administered 2021-08-17: 7 [IU] via SUBCUTANEOUS
  Administered 2021-08-17: 3 [IU] via SUBCUTANEOUS
  Administered 2021-08-18 (×2): 15 [IU] via SUBCUTANEOUS
  Administered 2021-08-19 (×2): 7 [IU] via SUBCUTANEOUS
  Administered 2021-08-20: 4 [IU] via SUBCUTANEOUS
  Administered 2021-08-20 (×2): 11 [IU] via SUBCUTANEOUS
  Administered 2021-08-21: 4 [IU] via SUBCUTANEOUS
  Administered 2021-08-22: 3 [IU] via SUBCUTANEOUS
  Administered 2021-08-22: 4 [IU] via SUBCUTANEOUS

## 2021-08-13 MED ORDER — MAGNESIUM SULFATE 2 GM/50ML IV SOLN
2.0000 g | Freq: Once | INTRAVENOUS | Status: AC
  Administered 2021-08-13: 2 g via INTRAVENOUS
  Filled 2021-08-13: qty 50

## 2021-08-13 MED ORDER — POTASSIUM CHLORIDE 20 MEQ PO PACK
40.0000 meq | PACK | Freq: Once | ORAL | Status: AC
  Administered 2021-08-13: 40 meq
  Filled 2021-08-13: qty 2

## 2021-08-13 MED ORDER — INSULIN ASPART 100 UNIT/ML IJ SOLN
8.0000 [IU] | Freq: Three times a day (TID) | INTRAMUSCULAR | Status: DC
Start: 2021-08-13 — End: 2021-08-15
  Administered 2021-08-13 – 2021-08-15 (×6): 8 [IU] via SUBCUTANEOUS

## 2021-08-13 MED ORDER — INSULIN ASPART 100 UNIT/ML IJ SOLN
0.0000 [IU] | Freq: Every day | INTRAMUSCULAR | Status: DC
  Administered 2021-08-13 – 2021-08-14 (×2): 4 [IU] via SUBCUTANEOUS
  Administered 2021-08-15 – 2021-08-21 (×4): 3 [IU] via SUBCUTANEOUS

## 2021-08-13 NOTE — Progress Notes (Signed)
Occupational Therapy Treatment Patient Details Name: Joshua Donovan MRN: [id number] DOB: 1955-03-10 Today's Date: 08/13/2021   History of present illness Pt is a 66 y.o. male who presented 08/02/21 with R foot pain and R-sided weakness. Transferred to [facility] 7/25. 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 S/p stent placement to the R vertebrobasilar junction 7/26, failed extubation. Trach and PEG placed 7/28. S/p bronchoscopic evaluation, oral reintubation and tracheostomy removal with stoma packing 7/31 due to trach site bleeding. S/p trach revision 8/2. PMH: DM, GERD, TBI with prior ICH, HLD, HTN   OT comments  This 65 yo male seen in conjunction with PT with hope to sitting pt up on EOB and working on sitting balance, propping, and head control; however pt was having an interesting breathing paper with appearance of biceps muscles tensing up as he was inhaling to then exhale when muscles would relax. So had pt try and work on moving his neck/head with A (no trace of movement noted). He was however able to move his eyes to the far right and left to denote YES or NO on paper held in front of him and mouth the words YES and NO (still not the easiest to decipher). PROM of arms still WNL.He will continue to benefit from acute OT with follow up on LTACH.   Recommendations for follow up therapy are one component of a multi-disciplinary discharge planning process, led by the attending physician.  Recommendations may be updated based on patient status, additional functional criteria and insurance authorization.    Follow Up Recommendations  OT at  Long-term acute care hospital    Assistance Recommended at Discharge Frequent or constant Supervision/Assistance  Patient can return home with the following  Two people to help with walking and/or transfers;Two people to help with bathing/dressing/bathroom;Assistance with cooking/housework;Assistance with feeding;Direct supervision/assist for medications management;Direct supervision/assist for financial management;Assist for transportation;Help with stairs or ramp for entrance         Precautions / Restrictions Precautions Precautions: Fall;Other (comment) Precaution Comments: trach on vent, peg, SBP < 180, prevalon boots Restrictions Weight Bearing Restrictions: No       Mobility Bed Mobility Overal bed mobility: Needs Assistance Bed Mobility: Rolling Rolling: Total assist, +2 for physical assistance, +2 for safety/equipment         General bed mobility comments: Total A x2 to roll either direction for pericare and bed sheet change due to noted BM. Pt placed in semi-chair position end of session.            ADL either performed or assessed with clinical judgement   ADL Overall ADL's : Needs assistance/impaired                                       General ADL Comments: total A for all aspects of his care    Extremity/Trunk Assessment Upper Extremity Assessment RUE Deficits / Details: No AROM noted.  PROM intact. LUE Deficits / Details: No AROM noted.  PROM WFL            Vision Baseline Vision/History: 0 No visual deficits Ability  to See in Adequate Light: 0 Adequate            Cognition Arousal/Alertness: Awake/alert Behavior During Therapy: WFL for tasks assessed/performed Overall Cognitive Status: Difficult to assess                         Following Commands: Follows one step commands inconsistently (for eye movements)       General Comments: Pt following cues to communicate via mouthing yes/no or looking at yes/no on a   sheet of paper. Difficult to formally assess cognition due to trach                   Pertinent Vitals/ Pain       Pain Assessment Pain Assessment: Faces Faces Pain Scale: Hurts a little bit Pain Location: did not indicate Pain Descriptors / Indicators: Grimacing Pain Intervention(s): Limited activity within patient's tolerance, Monitored during session, Repositioned         Frequency  Min 2X/week        Progress Toward Goals  OT Goals(current goals can now be found in the care plan section)  Progress towards OT goals: Not progressing toward goals - comment (still no movement at neck to assess soft touch call be and no family available for education; He is more consistent now with looking to the left or right to denote Yes or No in response to questions)  Acute Rehab OT Goals Patient Stated Goal: unable to state OT Goal Formulation: Patient unable to participate in goal setting Time For Goal Achievement: 08/19/21 Potential to Achieve Goals: [place] Discharge plan remains appropriate    Co-evaluation    PT/OT/SLP Co-Evaluation/Treatment: Yes Reason for Co-Treatment: Complexity of the patient's impairments (multi-system involvement);For patient/therapist safety PT goals addressed during session: Mobility/safety with mobility;Strengthening/ROM OT goals addressed during session: Strengthening/ROM      AM-PAC OT "6 Clicks" Daily Activity     Outcome Measure   Help from another person eating meals?: Total Help from another person taking care of personal grooming?: Total Help from another person toileting, which includes using toliet, bedpan, or urinal?: Total Help from another person bathing (including washing, rinsing, drying)?: Total Help from another person to put on and taking off regular upper body clothing?: Total Help from another person to put on and taking off regular lower body clothing?: Total 6 Click Score: 6    End of Session    OT Visit  Diagnosis: Other symptoms and signs involving the nervous system (R29.898)   Activity Tolerance Patient tolerated treatment well   Patient Left in bed;with bed alarm set   Nurse Communication  (pt appears to tensing arm muscles (mainly biceps) as he prepares to breath then arms relax as he takes the breath--corresponds to wave form on vent)        Time: 1140-1215 OT Time Calculation (min): 35 min  Charges: OT General Charges $OT Visit: 1 Visit OT Treatments $Therapeutic Activity: 8-22 mins  [address], OTR/L Acute Rehab Services [facility] [phone] Office [phone]    Almon Register 08/13/2021, 7:48 PM

## 2021-08-13 NOTE — Progress Notes (Signed)
STROKE TEAM PROGRESS NOTE   SUBJECTIVE (INTERVAL HISTORY)  He had slight bleeding from tracheostomy yesterday and did trach collar trials during the day.   .  Remains on ventilatory support on tracheostomy this morning.   Neuro status unchanged. Remains quadriplegic and will open eyes to voice and follow commands.  Vital signs stable. OBJECTIVE Temp:  [99.9 F (37.7 C)-100.9 F (38.3 C)] 99.9 F (37.7 C) (08/04 0800) Pulse Rate:  [68-108] 90 (08/04 1200) Cardiac Rhythm: Normal sinus rhythm (08/04 1200) Resp:  [12-42] 12 (08/04 1200) BP: (115-200)/(63-97) 151/77 (08/04 1200) SpO2:  [95 %-99 %] 98 % (08/04 1200) FiO2 (%):  [40 %] 40 % (08/04 1109)  Recent Labs  Lab 08/12/21 1913 08/12/21 2315 08/13/21 0321 08/13/21 0747 08/13/21 1208  GLUCAP 229* 161* 126* 202* 273*   Recent Labs  Lab 08/06/21 1812 08/07/21 0225 08/07/21 0225 08/08/21 0227 08/09/21 0157 08/10/21 0623 08/12/21 0222 08/13/21 0247  NA  --  139   < > 141 143 143 140 141  K  --  3.5   < > 3.8 3.4* 4.0 4.3 3.4*  CL  --  104   < > 106 105 108 105 104  CO2  --  26   < > '25 29 30 27 27  '$ GLUCOSE  --  290*   < > 258* 212* 258* 251* 136*  BUN  --  12   < > '14 15 23 22 15  '$ CREATININE  --  0.72   < > 0.69 0.70 0.73 0.78 0.72  CALCIUM  --  8.7*   < > 8.6* 9.2 9.0 8.7* 8.8*  MG 2.1 2.0  --   --   --   --  1.8 1.8  PHOS 2.9 3.2  --   --   --   --  4.5 3.0   < > = values in this interval not displayed.   Recent Labs  Lab 08/10/21 0623  AST 30  ALT 50*  ALKPHOS 30*  BILITOT 0.5  PROT 6.0*  ALBUMIN 2.5*   Recent Labs  Lab 08/07/21 0225 08/09/21 0157 08/10/21 0623 08/12/21 0222 08/13/21 0247  WBC 8.9 9.6 7.3 8.3 11.2*  NEUTROABS  --   --  5.0  --   --   HGB 13.2 13.3 12.0* 11.8* 12.7*  HCT 40.4 41.3 37.7* 36.2* 38.0*  MCV 89.2 89.4 91.5 90.0 86.4  PLT 291 343 325 333 407*   No results for input(s): "CKTOTAL", "CKMB", "CKMBINDEX", "TROPONINI" in the last 168 hours. No results for input(s): "LABPROT",  "INR" in the last 72 hours. No results for input(s): "COLORURINE", "LABSPEC", "PHURINE", "GLUCOSEU", "HGBUR", "BILIRUBINUR", "KETONESUR", "PROTEINUR", "UROBILINOGEN", "NITRITE", "LEUKOCYTESUR" in the last 72 hours.  Invalid input(s): "APPERANCEUR"     Component Value Date/Time   CHOL 203 (H) 08/03/2021 0347   TRIG 175 (H) 08/03/2021 0347   HDL 52 08/03/2021 0347   CHOLHDL 3.9 08/03/2021 0347   VLDL 35 08/03/2021 0347   LDLCALC 116 (H) 08/03/2021 0347   Lab Results  Component Value Date   HGBA1C 8.3 (H) 08/03/2021   No results found for: "LABOPIA", "COCAINSCRNUR", "LABBENZ", "AMPHETMU", "THCU", "LABBARB"  No results for input(s): "ETH" in the last 168 hours.  I have personally reviewed the radiological images below and agree with the radiology interpretations.  DG CHEST PORT 1 VIEW  Result Date: 08/09/2021 CLINICAL DATA:  Intubation. EXAM: PORTABLE CHEST 1 VIEW COMPARISON:  Same day. FINDINGS: The heart size and mediastinal contours are within  normal limits. Endotracheal tube is noted in grossly good position. Minimal bibasilar subsegmental atelectasis is noted. The visualized skeletal structures are unremarkable. IMPRESSION: Endotracheal tube in grossly good position. Electronically Signed   By: Marijo Conception M.D.   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 He presented with progressively worsening weakness in the right lower extremity, left lower extremity, right upper extremity, and left upper extremity. On the morning of the procedure, patient was flaccid at the right-sided extremities and had severe weakness in the left-sided extremities (grade 1/5). CTA of the  head and neck and MRI of the brain demonstrated severe stenoses at the right vertebrobasilar junction measuring about 90%. 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 There was occlusion of the left vertebral artery seen distal to the origin of the PICA. 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 EXAM: 1.  Biplane right carotid and cerebral DSA angiogram. 2.  Biplane left common carotid and cerebral DSA cerebral angiogram 3. Indirect left vertebral and cerebral angiogram with tip of the catheter at the origin of the left vertebral artery 4.  Right subclavian arteriogram. 5. Selective catheterization of the right vertebral artery, biplane vertebral and cerebral DSA cerebral angiogram. 6. Selective catheterization of the right vertebral artery by using the 088 neuron max guide catheter, 5 French AXS catalyst catheter as an intermediate catheter and 021 phenom microcatheter with a 014 Arostotle microwire. 7. High magnification biplane cerebral angiogram and exchange of the microcatheter over an exchange length 014 zoom wire into an Apex 2.25 x 15 mm monorail balloon catheter and angioplasty of the severely 90% stenotic right vertebrobasilar junction. 8. Resolute onyx 3 mm x 22 mm stent placement at the site of the stenosis. 9. Post stent biplane DSA cerebral angiography of the right vertebral artery injection. 10. Right common femoral arteriogram in the RAO projection after withdrawing the sheath with its tip in the external iliac artery and arteriogram Attending: Dr. Frazier Richards Assistant: Dr. Luanne Bras COMPARISON:  CORRELATION IS MADE WITH CTA OF THE HEAD AND NECK DATED August 03, 2021 MEDICATIONS: The antibiotic was administered within 1 hour of the procedure Injection Ancef 2 g Injection Integrilin 4.5 mg at 1.5 mg increments intra-arterially through the right vertebral artery Heparin 3500 units ANESTHESIA/SEDATION: Anesthesia was  provided by the anesthesia team. Please refer to the anesthesia notes for detailed information. CONTRAST:  126 mL of Omnipaque 300 FLUOROSCOPY: Fluoroscopy Time: 48 minutes 48 seconds (3499 mGy). COMPLICATIONS: None immediate. TECHNIQUE: Informed written consent was obtained from the patient and his daughter after a thorough discussion of the procedural risks including but not limited to approximately 5% bleeding, hematoma, vascular injury, worsening of the stroke, ventilator dependency etc. Benefits and alternatives. All questions were addressed. Maximal Sterile Barrier Technique was utilized including caps, mask, sterile gowns, sterile gloves, sterile drape, hand hygiene and skin antiseptic. A timeout was performed prior to the initiation of the procedure. PROCEDURE: The right groin was prepped and draped in the usual sterile fashion. Thereafter using modified Seldinger technique, transfemoral access into the right common femoral artery was obtained without difficulty. Over a 0.035 inch guidewire, an 8 [place] x 25 cm pinnacle sheath was  inserted. Through this, and also over 0.035 inch roadrunner wire, a 5 [place] JB 1 catheter was advanced to the aortic arch region and selectively positioned in the right common carotid artery, and biplane DSA carotid and cerebral angiogram was obtained. Next, with the catheter tip in the left common carotid artery, biplane DSA carotid and cerebral Angiography of the head was obtained. The, selective catheterization of the left subclavian artery followed by angiogram was performed. With the tip of the catheter at the origin of the left vertebral artery, indirect left vertebral and biplane DSA cerebral angiography of left subclavian arterial injection was performed. Next, selective catheterization of the right subclavian artery was performed and the catheter was advanced into the proximal subclavian artery. Next, the wire was exchanged for an exchange length 035 Rosen wire. The JB 1  catheter was removed and 088 neuron max guide catheter and 5 French AXS catalyst catheter were advanced and selective catheterization of the left vertebral artery followed by vertebral and cerebral angiogram was performed. Next, under high magnification angiography, and under roadmap guidance, phenom 21 microcatheter and 0 1 for restarting microwire were advanced into the left posterior cerebral artery and angiogram was performed. Next, the wire was exchanged for a 014 zoom exchange length wire. Subsequently, we selected 2.25 x 15 mm Apex monorail microcatheter and under roadmap guidance, angioplasty of the severely stenotic right vertebrobasilar artery was performed. Post angioplasty angiogram was performed. Next, the balloon was removed. Measurements were made. Next we selected 3 mm x 22 mm resolute onyx stent and the stent was deployed at the right vertebrobasilar stenotic site. The stent was gently dilated and was inflated up to 10 atmospheric pressure. Next, post stent biplane DSA cerebral angiogram was performed. At this point in time, we felt the goal of the procedure was obtained. The catheter was removed. Femoral access site was further sterilely cleaned and the femoral sheath was withdrawn with its tip in the right external iliac artery, femoral angiogram was performed in the RAO projection and the femoral access site was closed by using an 8 [place] Angio-Seal. Patient tolerated the procedure well and was transferred to the ICU intubated in stable condition. No immediate complications. FINDINGS: Right CCA: The right common carotid arteriogram demonstrates the right external carotid artery and its major branches to be widely patent. Right ICA: The right internal carotid artery at the bulb to the cranial skull base opacifies normally. No significant stenosis. Right Intracranial: The petrous and cavernous segments are widely patent. Mild 20-30% stenosis at the supraclinoid ICA. The right middle cerebral  artery and the right anterior cerebral artery opacify normally into the capillary and venous phases. Mild-to-moderate atheromatous disease with about 30% stenosis in the right M1 segment. There is severe about 70% stenosis of the right pericallosal branch of the ACA seen in the proximal aspect. There is cross filling via the anterior communicating artery seen with opacification of the left anterior cerebral artery. PCOM is patent with faint opacification of the right posterior cerebral artery. Right Sided Anterior Venous: The venous phase demonstrates preferential egress of contrast into the right transverse sinus, the sigmoid sinus and the right internal jugular vein. There is no early venous shunting into the venous structures at the skull base or intracranially into the dural sinuses. Left CCA: The left common carotid arteriogram demonstrates the left external carotid artery and its major branches to be widely patent. Left ICA: The left internal carotid artery at the bulb to the cranial skull base opacifies normally.  Mild atheromatous disease at the origin of the ICA. There is mild stenosis seen about 2 cm from the origin of the left ICA measuring 42 percent by the NASCET criteria. Left Intracranial: The petrous, cavernous and supraclinoid segments are widely patent. The left middle cerebral artery and the left anterior cerebral artery opacify normally into the capillary and venous phases. There is a prominent PCOM opacifying the bilateral posterior cerebral arteries via the distal basilar artery. Left Sided Anterior Venous: The venous phase demonstrates preferential egress of contrast into the right transverse sinus, the sigmoid sinus and the right internal jugular vein. There is opacification of the left transverse sigmoid sinuses and internal jugular veins seen as well. There is no early venous shunting into the venous structures at the skull base or intracranially into the dural sinuses. Indirect left  vertebral and cerebral angiogram with injection of the left subclavian artery demonstrate faint opacification of the left vertebral artery. 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 High magnification biplane DSA cerebral angiography of right vertebral artery injection demonstrate severe about 90% stenosis at the right vertebrobasilar junction in length of about 7 mm. Post angioplasty angiogram demonstrate significant residual stenoses. Post stent angiogram demonstrate complete expansion of the stent with no residual stenosis. There is about 40-50% stenosis seen at the mid basilar artery. Right PICA, bilateral superior cerebellar, AICA and posterior cerebral arteries show moderate atheromatous disease. No significant stenosis. IMPRESSION: 1. Right vertebral and cerebral angiogram demonstrated about 90% stenosis at the vertebrobasilar junction. Successful angioplasty and stent placement without significant residual stenosis. 2.  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 3. 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 4. Mild atheromatous disease in the right intracranial ICA with 20-30% stenosis at the supraclinoid ICA and 30% stenosis in the right M1 segment. There is about 70% stenoses of the right pericallosal branch of the ACA seen in the A3 segment. 5.  Bilateral P comms are patent with the left PCOM being prominent. PLAN: 1.  Blood pressure goals between 120-140 mm Hg. 2.  Patient to be on dual antiplatelet agents for 6 months. Electronically Signed   By: Frazier Richards M.D.   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 He presented with  progressively worsening weakness in the right lower extremity, left lower extremity, right upper extremity, and left upper extremity. On the morning of the procedure, patient was flaccid at the right-sided extremities and had severe weakness in the left-sided extremities (grade 1/5). 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 There was occlusion of the left vertebral artery seen distal to the origin of the PICA. 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 EXAM: 1.  Biplane right carotid and cerebral DSA angiogram. 2.  Biplane left common carotid and cerebral DSA cerebral angiogram 3. Indirect left vertebral and cerebral angiogram with tip of the catheter at the origin of the left vertebral artery 4.  Right subclavian arteriogram. 5. Selective catheterization of the right vertebral artery, biplane vertebral and  cerebral DSA cerebral angiogram. 6. Selective catheterization of the right vertebral artery by using the 088 neuron max guide catheter, 5 French AXS catalyst catheter as an intermediate catheter and 021 phenom microcatheter with a 014 Arostotle microwire. 7. High magnification biplane cerebral angiogram and exchange of the microcatheter over an exchange length 014 zoom wire into an Apex 2.25 x 15 mm monorail balloon catheter and angioplasty of the severely 90% stenotic right vertebrobasilar junction. 8. Resolute onyx 3 mm x 22 mm stent placement at the site of the stenosis. 9. Post stent biplane DSA cerebral angiography of the right vertebral artery injection. 10. Right common femoral arteriogram in the RAO projection after withdrawing the sheath with its tip in the external iliac artery and arteriogram Attending: Dr. Frazier Richards Assistant: Dr. Luanne Bras COMPARISON:  CORRELATION IS MADE WITH CTA  OF THE HEAD AND NECK DATED August 03, 2021 MEDICATIONS: The antibiotic was administered within 1 hour of the procedure Injection Ancef 2 g Injection Integrilin 4.5 mg at 1.5 mg increments intra-arterially through the right vertebral artery Heparin 3500 units ANESTHESIA/SEDATION: Anesthesia was provided by the anesthesia team. Please refer to the anesthesia notes for detailed information. CONTRAST:  126 mL of Omnipaque 300 FLUOROSCOPY: Fluoroscopy Time: 48 minutes 48 seconds (3499 mGy). COMPLICATIONS: None immediate. TECHNIQUE: Informed written consent was obtained from the patient and his daughter after a thorough discussion of the procedural risks including but not limited to approximately 5% bleeding, hematoma, vascular injury, worsening of the stroke, ventilator dependency etc. Benefits and alternatives. All questions were addressed. Maximal Sterile Barrier Technique was utilized including caps, mask, sterile gowns, sterile gloves, sterile drape, hand hygiene and skin antiseptic. A timeout was performed prior to the initiation of the procedure. PROCEDURE: The right groin was prepped and draped in the usual sterile fashion. Thereafter using modified Seldinger technique, transfemoral access into the right common femoral artery was obtained without difficulty. Over a 0.035 inch guidewire, an 8 [place] x 25 cm pinnacle sheath was inserted. Through this, and also over 0.035 inch roadrunner wire, a 5 [place] JB 1 catheter was advanced to the aortic arch region and selectively positioned in the right common carotid artery, and biplane DSA carotid and cerebral angiogram was obtained. Next, with the catheter tip in the left common carotid artery, biplane DSA carotid and cerebral Angiography of the head was obtained. The, selective catheterization of the left subclavian artery followed by angiogram was performed. With the tip of the catheter at the origin of the left vertebral artery, indirect left vertebral and biplane DSA  cerebral angiography of left subclavian arterial injection was performed. Next, selective catheterization of the right subclavian artery was performed and the catheter was advanced into the proximal subclavian artery. Next, the wire was exchanged for an exchange length 035 Rosen wire. The JB 1 catheter was removed and 088 neuron max guide catheter and 5 French AXS catalyst catheter were advanced and selective catheterization of the left vertebral artery followed by vertebral and cerebral angiogram was performed. Next, under high magnification angiography, and under roadmap guidance, phenom 21 microcatheter and 0 1 for restarting microwire were advanced into the left posterior cerebral artery and angiogram was performed. Next, the wire was exchanged for a 014 zoom exchange length wire. Subsequently, we selected 2.25 x 15 mm Apex monorail microcatheter and under roadmap guidance, angioplasty of the severely stenotic right vertebrobasilar artery was performed. Post angioplasty angiogram was performed. Next, the balloon was removed. Measurements were made. Next we selected 3 mm x  22 mm resolute onyx stent and the stent was deployed at the right vertebrobasilar stenotic site. The stent was gently dilated and was inflated up to 10 atmospheric pressure. Next, post stent biplane DSA cerebral angiogram was performed. At this point in time, we felt the goal of the procedure was obtained. The catheter was removed. Femoral access site was further sterilely cleaned and the femoral sheath was withdrawn with its tip in the right external iliac artery, femoral angiogram was performed in the RAO projection and the femoral access site was closed by using an 8 [place] Angio-Seal. Patient tolerated the procedure well and was transferred to the ICU intubated in stable condition. No immediate complications. FINDINGS: Right CCA: The right common carotid arteriogram demonstrates the right external carotid artery and its major branches to be  widely patent. Right ICA: The right internal carotid artery at the bulb to the cranial skull base opacifies normally. No significant stenosis. Right Intracranial: The petrous and cavernous segments are widely patent. Mild 20-30% stenosis at the supraclinoid ICA. The right middle cerebral artery and the right anterior cerebral artery opacify normally into the capillary and venous phases. Mild-to-moderate atheromatous disease with about 30% stenosis in the right M1 segment. There is severe about 70% stenosis of the right pericallosal branch of the ACA seen in the proximal aspect. There is cross filling via the anterior communicating artery seen with opacification of the left anterior cerebral artery. PCOM is patent with faint opacification of the right posterior cerebral artery. Right Sided Anterior Venous: The venous phase demonstrates preferential egress of contrast into the right transverse sinus, the sigmoid sinus and the right internal jugular vein. There is no early venous shunting into the venous structures at the skull base or intracranially into the dural sinuses. Left CCA: The left common carotid arteriogram demonstrates the left external carotid artery and its major branches to be widely patent. Left ICA: The left internal carotid artery at the bulb to the cranial skull base opacifies normally. Mild atheromatous disease at the origin of the ICA. There is mild stenosis seen about 2 cm from the origin of the left ICA measuring 42 percent by the NASCET criteria. Left Intracranial: The petrous, cavernous and supraclinoid segments are widely patent. The left middle cerebral artery and the left anterior cerebral artery opacify normally into the capillary and venous phases. There is a prominent PCOM opacifying the bilateral posterior cerebral arteries via the distal basilar artery. Left Sided Anterior Venous: The venous phase demonstrates preferential egress of contrast into the right transverse sinus, the sigmoid  sinus and the right internal jugular vein. There is opacification of the left transverse sigmoid sinuses and internal jugular veins seen as well. There is no early venous shunting into the venous structures at the skull base or intracranially into the dural sinuses. Indirect left vertebral and cerebral angiogram with injection of the left subclavian artery demonstrate faint opacification of the left vertebral artery. 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 High magnification biplane DSA cerebral angiography of right vertebral artery injection demonstrate severe about 90% stenosis at the right vertebrobasilar junction in length of about 7 mm. Post angioplasty angiogram demonstrate significant residual stenoses. Post stent angiogram demonstrate complete expansion of the stent with no residual stenosis. There is about 40-50% stenosis seen at the mid basilar artery. Right PICA, bilateral superior cerebellar, AICA and posterior cerebral arteries show moderate atheromatous disease. No significant stenosis. IMPRESSION: 1. Right vertebral and cerebral angiogram demonstrated about 90% stenosis  at the vertebrobasilar junction. Successful angioplasty and stent placement without significant residual stenosis. 2.  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 3. 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 4. Mild atheromatous disease in the right intracranial ICA with 20-30% stenosis at the supraclinoid ICA and 30% stenosis in the right M1 segment. There is about 70% stenoses of the right pericallosal branch of the ACA seen in the A3 segment. 5.  Bilateral P comms are patent with the left PCOM being prominent. PLAN: 1.  Blood pressure goals between 120-140 mm Hg. 2.  Patient to be on dual antiplatelet agents for 6 months. Electronically Signed   By: Frazier Richards M.D.   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 He presented with progressively worsening weakness in the right lower extremity, left lower extremity, right upper extremity, and left upper extremity. On the morning of the procedure, patient was flaccid at the right-sided extremities and had severe weakness in the left-sided extremities (grade 1/5). 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 There was occlusion of the left vertebral artery seen distal to the origin of the PICA. 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 EXAM: 1.  Biplane right carotid and cerebral DSA angiogram. 2.  Biplane left common carotid and cerebral DSA cerebral angiogram 3. Indirect left vertebral and cerebral angiogram with tip of the catheter at the origin of the left vertebral artery 4.  Right subclavian arteriogram. 5. Selective catheterization of the right vertebral artery, biplane vertebral and cerebral DSA cerebral angiogram. 6. Selective catheterization of the right vertebral artery by using the 088 neuron max guide catheter, 5 French AXS catalyst catheter as an intermediate catheter and 021 phenom microcatheter with a 014 Arostotle microwire. 7. High magnification biplane cerebral angiogram and exchange of the microcatheter over an exchange length 014 zoom wire into an Apex 2.25 x 15 mm monorail balloon catheter and angioplasty of the severely 90% stenotic right vertebrobasilar junction. 8. Resolute onyx 3 mm x 22 mm stent placement at the site of the stenosis. 9. Post stent biplane DSA cerebral  angiography of the right vertebral artery injection. 10. Right common femoral arteriogram in the RAO projection after withdrawing the sheath with its tip in the external iliac artery and arteriogram Attending: Dr. Frazier Richards Assistant: Dr. Luanne Bras COMPARISON:  CORRELATION IS MADE WITH CTA OF THE HEAD AND NECK DATED August 03, 2021 MEDICATIONS: The antibiotic was administered within 1 hour of the procedure Injection Ancef 2 g Injection Integrilin 4.5 mg at 1.5 mg increments intra-arterially through the right vertebral artery Heparin 3500 units ANESTHESIA/SEDATION: Anesthesia was provided by the anesthesia team. Please refer to the anesthesia notes for detailed information. CONTRAST:  126 mL of Omnipaque 300 FLUOROSCOPY: Fluoroscopy Time: 48 minutes 48 seconds (3499 mGy). COMPLICATIONS: None immediate. TECHNIQUE: Informed written consent was obtained from the patient and his daughter after a thorough discussion of the procedural risks including but not limited to approximately 5% bleeding, hematoma, vascular injury, worsening of the stroke, ventilator dependency  etc. Benefits and alternatives. All questions were addressed. Maximal Sterile Barrier Technique was utilized including caps, mask, sterile gowns, sterile gloves, sterile drape, hand hygiene and skin antiseptic. A timeout was performed prior to the initiation of the procedure. PROCEDURE: The right groin was prepped and draped in the usual sterile fashion. Thereafter using modified Seldinger technique, transfemoral access into the right common femoral artery was obtained without difficulty. Over a 0.035 inch guidewire, an 8 French x 25 cm pinnacle sheath was inserted. Through this, and also over 0.035 inch roadrunner wire, a 5 [place] JB 1 catheter was advanced to the aortic arch region and selectively positioned in the right common carotid artery, and biplane DSA carotid and cerebral angiogram was obtained. Next, with the catheter tip in the left  common carotid artery, biplane DSA carotid and cerebral Angiography of the head was obtained. The, selective catheterization of the left subclavian artery followed by angiogram was performed. With the tip of the catheter at the origin of the left vertebral artery, indirect left vertebral and biplane DSA cerebral angiography of left subclavian arterial injection was performed. Next, selective catheterization of the right subclavian artery was performed and the catheter was advanced into the proximal subclavian artery. Next, the wire was exchanged for an exchange length 035 Rosen wire. The JB 1 catheter was removed and 088 neuron max guide catheter and 5 French AXS catalyst catheter were advanced and selective catheterization of the left vertebral artery followed by vertebral and cerebral angiogram was performed. Next, under high magnification angiography, and under roadmap guidance, phenom 21 microcatheter and 0 1 for restarting microwire were advanced into the left posterior cerebral artery and angiogram was performed. Next, the wire was exchanged for a 014 zoom exchange length wire. Subsequently, we selected 2.25 x 15 mm Apex monorail microcatheter and under roadmap guidance, angioplasty of the severely stenotic right vertebrobasilar artery was performed. Post angioplasty angiogram was performed. Next, the balloon was removed. Measurements were made. Next we selected 3 mm x 22 mm resolute onyx stent and the stent was deployed at the right vertebrobasilar stenotic site. The stent was gently dilated and was inflated up to 10 atmospheric pressure. Next, post stent biplane DSA cerebral angiogram was performed. At this point in time, we felt the goal of the procedure was obtained. The catheter was removed. Femoral access site was further sterilely cleaned and the femoral sheath was withdrawn with its tip in the right external iliac artery, femoral angiogram was performed in the RAO projection and the femoral access site  was closed by using an 8 [place] Angio-Seal. Patient tolerated the procedure well and was transferred to the ICU intubated in stable condition. No immediate complications. FINDINGS: Right CCA: The right common carotid arteriogram demonstrates the right external carotid artery and its major branches to be widely patent. Right ICA: The right internal carotid artery at the bulb to the cranial skull base opacifies normally. No significant stenosis. Right Intracranial: The petrous and cavernous segments are widely patent. Mild 20-30% stenosis at the supraclinoid ICA. The right middle cerebral artery and the right anterior cerebral artery opacify normally into the capillary and venous phases. Mild-to-moderate atheromatous disease with about 30% stenosis in the right M1 segment. There is severe about 70% stenosis of the right pericallosal branch of the ACA seen in the proximal aspect. There is cross filling via the anterior communicating artery seen with opacification of the left anterior cerebral artery. PCOM is patent with faint opacification of the right posterior cerebral artery. Right  Sided Anterior Venous: The venous phase demonstrates preferential egress of contrast into the right transverse sinus, the sigmoid sinus and the right internal jugular vein. There is no early venous shunting into the venous structures at the skull base or intracranially into the dural sinuses. Left CCA: The left common carotid arteriogram demonstrates the left external carotid artery and its major branches to be widely patent. Left ICA: The left internal carotid artery at the bulb to the cranial skull base opacifies normally. Mild atheromatous disease at the origin of the ICA. There is mild stenosis seen about 2 cm from the origin of the left ICA measuring 42 percent by the NASCET criteria. Left Intracranial: The petrous, cavernous and supraclinoid segments are widely patent. The left middle cerebral artery and the left anterior cerebral  artery opacify normally into the capillary and venous phases. There is a prominent PCOM opacifying the bilateral posterior cerebral arteries via the distal basilar artery. Left Sided Anterior Venous: The venous phase demonstrates preferential egress of contrast into the right transverse sinus, the sigmoid sinus and the right internal jugular vein. There is opacification of the left transverse sigmoid sinuses and internal jugular veins seen as well. There is no early venous shunting into the venous structures at the skull base or intracranially into the dural sinuses. Indirect left vertebral and cerebral angiogram with injection of the left subclavian artery demonstrate faint opacification of the left vertebral artery. 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 High magnification biplane DSA cerebral angiography of right vertebral artery injection demonstrate severe about 90% stenosis at the right vertebrobasilar junction in length of about 7 mm. Post angioplasty angiogram demonstrate significant residual stenoses. Post stent angiogram demonstrate complete expansion of the stent with no residual stenosis. There is about 40-50% stenosis seen at the mid basilar artery. Right PICA, bilateral superior cerebellar, AICA and posterior cerebral arteries show moderate atheromatous disease. No significant stenosis. IMPRESSION: 1. Right vertebral and cerebral angiogram demonstrated about 90% stenosis at the vertebrobasilar junction. Successful angioplasty and stent placement without significant residual stenosis. 2.  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 3. 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 4. Mild atheromatous disease in the right intracranial ICA with 20-30% stenosis at the supraclinoid ICA and 30% stenosis in the right M1 segment. There is about 70% stenoses of the right pericallosal branch of the ACA seen in the A3 segment. 5.  Bilateral P  comms are patent with the left PCOM being prominent. PLAN: 1.  Blood pressure goals between 120-140 mm Hg. 2.  Patient to be on dual antiplatelet agents for 6 months. Electronically Signed   By: Frazier Richards M.D.   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 He presented with progressively worsening weakness in the right lower extremity, left lower extremity, right upper extremity, and left upper extremity. On the morning of the procedure, patient was flaccid at the right-sided extremities and had severe weakness in the left-sided extremities (grade 1/5). 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 There was occlusion of the left vertebral artery seen distal to  the origin of the PICA. 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 EXAM: 1.  Biplane right carotid and cerebral DSA angiogram. 2.  Biplane left common carotid and cerebral DSA cerebral angiogram 3. Indirect left vertebral and cerebral angiogram with tip of the catheter at the origin of the left vertebral artery 4.  Right subclavian arteriogram. 5. Selective catheterization of the right vertebral artery, biplane vertebral and cerebral DSA cerebral angiogram. 6. Selective catheterization of the right vertebral artery by using the 088 neuron max guide catheter, 5 French AXS catalyst catheter as an intermediate catheter and 021 phenom microcatheter with a 014 Arostotle microwire. 7. High magnification biplane cerebral angiogram and exchange of the microcatheter over an exchange length 014  zoom wire into an Apex 2.25 x 15 mm monorail balloon catheter and angioplasty of the severely 90% stenotic right vertebrobasilar junction. 8. Resolute onyx 3 mm x 22 mm stent placement at the site of the stenosis. 9. Post stent biplane DSA cerebral angiography of the right vertebral artery injection. 10. Right common femoral arteriogram in the RAO projection after withdrawing the sheath with its tip in the external iliac artery and arteriogram Attending: Dr. Frazier Richards Assistant: Dr. Luanne Bras COMPARISON:  CORRELATION IS MADE WITH CTA OF THE HEAD AND NECK DATED August 03, 2021 MEDICATIONS: The antibiotic was administered within 1 hour of the procedure Injection Ancef 2 g Injection Integrilin 4.5 mg at 1.5 mg increments intra-arterially through the right vertebral artery Heparin 3500 units ANESTHESIA/SEDATION: Anesthesia was provided by the anesthesia team. Please refer to the anesthesia notes for detailed information. CONTRAST:  126 mL of Omnipaque 300 FLUOROSCOPY: Fluoroscopy Time: 48 minutes 48 seconds (3499 mGy). COMPLICATIONS: None immediate. TECHNIQUE: Informed written consent was obtained from the patient and his daughter after a thorough discussion of the procedural risks including but not limited to approximately 5% bleeding, hematoma, vascular injury, worsening of the stroke, ventilator dependency etc. Benefits and alternatives. All questions were addressed. Maximal Sterile Barrier Technique was utilized including caps, mask, sterile gowns, sterile gloves, sterile drape, hand hygiene and skin antiseptic. A timeout was performed prior to the initiation of the procedure. PROCEDURE: The right groin was prepped and draped in the usual sterile fashion. Thereafter using modified Seldinger technique, transfemoral access into the right common femoral artery was obtained without difficulty. Over a 0.035 inch guidewire, an 8 French x 25 cm pinnacle sheath was inserted. Through this, and also over 0.035 inch  roadrunner wire, a 5 [place] JB 1 catheter was advanced to the aortic arch region and selectively positioned in the right common carotid artery, and biplane DSA carotid and cerebral angiogram was obtained. Next, with the catheter tip in the left common carotid artery, biplane DSA carotid and cerebral Angiography of the head was obtained. The, selective catheterization of the left subclavian artery followed by angiogram was performed. With the tip of the catheter at the origin of the left vertebral artery, indirect left vertebral and biplane DSA cerebral angiography of left subclavian arterial injection was performed. Next, selective catheterization of the right subclavian artery was performed and the catheter was advanced into the proximal subclavian artery. Next, the wire was exchanged for an exchange length 035 Rosen wire. The JB 1 catheter was removed and 088 neuron max guide catheter and 5 French AXS catalyst catheter were advanced and selective catheterization of the left vertebral artery followed by vertebral and cerebral angiogram was performed. Next, under high magnification angiography, and under  roadmap guidance, phenom 21 microcatheter and 0 1 for restarting microwire were advanced into the left posterior cerebral artery and angiogram was performed. Next, the wire was exchanged for a 014 zoom exchange length wire. Subsequently, we selected 2.25 x 15 mm Apex monorail microcatheter and under roadmap guidance, angioplasty of the severely stenotic right vertebrobasilar artery was performed. Post angioplasty angiogram was performed. Next, the balloon was removed. Measurements were made. Next we selected 3 mm x 22 mm resolute onyx stent and the stent was deployed at the right vertebrobasilar stenotic site. The stent was gently dilated and was inflated up to 10 atmospheric pressure. Next, post stent biplane DSA cerebral angiogram was performed. At this point in time, we felt the goal of the procedure was  obtained. The catheter was removed. Femoral access site was further sterilely cleaned and the femoral sheath was withdrawn with its tip in the right external iliac artery, femoral angiogram was performed in the RAO projection and the femoral access site was closed by using an 8 [place] Angio-Seal. Patient tolerated the procedure well and was transferred to the ICU intubated in stable condition. No immediate complications. FINDINGS: Right CCA: The right common carotid arteriogram demonstrates the right external carotid artery and its major branches to be widely patent. Right ICA: The right internal carotid artery at the bulb to the cranial skull base opacifies normally. No significant stenosis. Right Intracranial: The petrous and cavernous segments are widely patent. Mild 20-30% stenosis at the supraclinoid ICA. The right middle cerebral artery and the right anterior cerebral artery opacify normally into the capillary and venous phases. Mild-to-moderate atheromatous disease with about 30% stenosis in the right M1 segment. There is severe about 70% stenosis of the right pericallosal branch of the ACA seen in the proximal aspect. There is cross filling via the anterior communicating artery seen with opacification of the left anterior cerebral artery. PCOM is patent with faint opacification of the right posterior cerebral artery. Right Sided Anterior Venous: The venous phase demonstrates preferential egress of contrast into the right transverse sinus, the sigmoid sinus and the right internal jugular vein. There is no early venous shunting into the venous structures at the skull base or intracranially into the dural sinuses. Left CCA: The left common carotid arteriogram demonstrates the left external carotid artery and its major branches to be widely patent. Left ICA: The left internal carotid artery at the bulb to the cranial skull base opacifies normally. Mild atheromatous disease at the origin of the ICA. There is mild  stenosis seen about 2 cm from the origin of the left ICA measuring 42 percent by the NASCET criteria. Left Intracranial: The petrous, cavernous and supraclinoid segments are widely patent. The left middle cerebral artery and the left anterior cerebral artery opacify normally into the capillary and venous phases. There is a prominent PCOM opacifying the bilateral posterior cerebral arteries via the distal basilar artery. Left Sided Anterior Venous: The venous phase demonstrates preferential egress of contrast into the right transverse sinus, the sigmoid sinus and the right internal jugular vein. There is opacification of the left transverse sigmoid sinuses and internal jugular veins seen as well. There is no early venous shunting into the venous structures at the skull base or intracranially into the dural sinuses. Indirect left vertebral and cerebral angiogram with injection of the left subclavian artery demonstrate faint opacification of the left vertebral artery. 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 High magnification biplane DSA cerebral angiography of right  vertebral artery injection demonstrate severe about 90% stenosis at the right vertebrobasilar junction in length of about 7 mm. Post angioplasty angiogram demonstrate significant residual stenoses. Post stent angiogram demonstrate complete expansion of the stent with no residual stenosis. There is about 40-50% stenosis seen at the mid basilar artery. Right PICA, bilateral superior cerebellar, AICA and posterior cerebral arteries show moderate atheromatous disease. No significant stenosis. IMPRESSION: 1. Right vertebral and cerebral angiogram demonstrated about 90% stenosis at the vertebrobasilar junction. Successful angioplasty and stent placement without significant residual stenosis. 2.  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 3. 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 4. Mild  atheromatous disease in the right intracranial ICA with 20-30% stenosis at the supraclinoid ICA and 30% stenosis in the right M1 segment. There is about 70% stenoses of the right pericallosal branch of the ACA seen in the A3 segment. 5.  Bilateral P comms are patent with the left PCOM being prominent. PLAN: 1.  Blood pressure goals between 120-140 mm Hg. 2.  Patient to be on dual antiplatelet agents for 6 months. Electronically Signed   By: Frazier Richards M.D.   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 He presented with progressively worsening weakness in the right lower extremity, left lower extremity, right upper extremity, and left upper extremity. On the morning of the procedure, patient was flaccid at the right-sided extremities and had severe weakness in the left-sided extremities (grade 1/5). 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 There was occlusion of the left vertebral artery seen distal to the origin of the PICA. 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 EXAM: 1.  Biplane right carotid and cerebral DSA angiogram. 2.  Biplane left common carotid and cerebral DSA cerebral angiogram 3. Indirect left vertebral and cerebral angiogram with tip of the catheter at the origin of the left vertebral artery 4.  Right subclavian arteriogram. 5. Selective catheterization of the right vertebral artery, biplane vertebral and cerebral DSA cerebral angiogram. 6. Selective catheterization of the right vertebral artery by using  the 088 neuron max guide catheter, 5 French AXS catalyst catheter as an intermediate catheter and 021 phenom microcatheter with a 014 Arostotle microwire. 7. High magnification biplane cerebral angiogram and exchange of the microcatheter over an exchange length 014 zoom wire into an Apex 2.25 x 15 mm monorail balloon catheter and angioplasty of the severely 90% stenotic right vertebrobasilar junction. 8. Resolute onyx 3 mm x 22 mm stent placement at the site of the stenosis. 9. Post stent biplane DSA cerebral angiography of the right vertebral artery injection. 10. Right common femoral arteriogram in the RAO projection after withdrawing the sheath with its tip in the external iliac artery and arteriogram Attending: Dr. Frazier Richards Assistant: Dr. Luanne Bras COMPARISON:  CORRELATION IS MADE WITH CTA OF THE HEAD AND NECK DATED August 03, 2021 MEDICATIONS: The antibiotic was administered within 1 hour of the procedure Injection Ancef 2 g Injection Integrilin 4.5 mg at 1.5 mg increments intra-arterially through the right vertebral artery Heparin 3500  units ANESTHESIA/SEDATION: Anesthesia was provided by the anesthesia team. Please refer to the anesthesia notes for detailed information. CONTRAST:  126 mL of Omnipaque 300 FLUOROSCOPY: Fluoroscopy Time: 48 minutes 48 seconds (3499 mGy). COMPLICATIONS: None immediate. TECHNIQUE: Informed written consent was obtained from the patient and his daughter after a thorough discussion of the procedural risks including but not limited to approximately 5% bleeding, hematoma, vascular injury, worsening of the stroke, ventilator dependency etc. Benefits and alternatives. All questions were addressed. Maximal Sterile Barrier Technique was utilized including caps, mask, sterile gowns, sterile gloves, sterile drape, hand hygiene and skin antiseptic. A timeout was performed prior to the initiation of the procedure. PROCEDURE: The right groin was prepped and draped in the usual sterile  fashion. Thereafter using modified Seldinger technique, transfemoral access into the right common femoral artery was obtained without difficulty. Over a 0.035 inch guidewire, an 8 French x 25 cm pinnacle sheath was inserted. Through this, and also over 0.035 inch roadrunner wire, a 5 [place] JB 1 catheter was advanced to the aortic arch region and selectively positioned in the right common carotid artery, and biplane DSA carotid and cerebral angiogram was obtained. Next, with the catheter tip in the left common carotid artery, biplane DSA carotid and cerebral Angiography of the head was obtained. The, selective catheterization of the left subclavian artery followed by angiogram was performed. With the tip of the catheter at the origin of the left vertebral artery, indirect left vertebral and biplane DSA cerebral angiography of left subclavian arterial injection was performed. Next, selective catheterization of the right subclavian artery was performed and the catheter was advanced into the proximal subclavian artery. Next, the wire was exchanged for an exchange length 035 Rosen wire. The JB 1 catheter was removed and 088 neuron max guide catheter and 5 French AXS catalyst catheter were advanced and selective catheterization of the left vertebral artery followed by vertebral and cerebral angiogram was performed. Next, under high magnification angiography, and under roadmap guidance, phenom 21 microcatheter and 0 1 for restarting microwire were advanced into the left posterior cerebral artery and angiogram was performed. Next, the wire was exchanged for a 014 zoom exchange length wire. Subsequently, we selected 2.25 x 15 mm Apex monorail microcatheter and under roadmap guidance, angioplasty of the severely stenotic right vertebrobasilar artery was performed. Post angioplasty angiogram was performed. Next, the balloon was removed. Measurements were made. Next we selected 3 mm x 22 mm resolute onyx stent and the stent  was deployed at the right vertebrobasilar stenotic site. The stent was gently dilated and was inflated up to 10 atmospheric pressure. Next, post stent biplane DSA cerebral angiogram was performed. At this point in time, we felt the goal of the procedure was obtained. The catheter was removed. Femoral access site was further sterilely cleaned and the femoral sheath was withdrawn with its tip in the right external iliac artery, femoral angiogram was performed in the RAO projection and the femoral access site was closed by using an 8 [place] Angio-Seal. Patient tolerated the procedure well and was transferred to the ICU intubated in stable condition. No immediate complications. FINDINGS: Right CCA: The right common carotid arteriogram demonstrates the right external carotid artery and its major branches to be widely patent. Right ICA: The right internal carotid artery at the bulb to the cranial skull base opacifies normally. No significant stenosis. Right Intracranial: The petrous and cavernous segments are widely patent. Mild 20-30% stenosis at the supraclinoid ICA. The right middle cerebral artery and the  right anterior cerebral artery opacify normally into the capillary and venous phases. Mild-to-moderate atheromatous disease with about 30% stenosis in the right M1 segment. There is severe about 70% stenosis of the right pericallosal branch of the ACA seen in the proximal aspect. There is cross filling via the anterior communicating artery seen with opacification of the left anterior cerebral artery. PCOM is patent with faint opacification of the right posterior cerebral artery. Right Sided Anterior Venous: The venous phase demonstrates preferential egress of contrast into the right transverse sinus, the sigmoid sinus and the right internal jugular vein. There is no early venous shunting into the venous structures at the skull base or intracranially into the dural sinuses. Left CCA: The left common carotid  arteriogram demonstrates the left external carotid artery and its major branches to be widely patent. Left ICA: The left internal carotid artery at the bulb to the cranial skull base opacifies normally. Mild atheromatous disease at the origin of the ICA. There is mild stenosis seen about 2 cm from the origin of the left ICA measuring 42 percent by the NASCET criteria. Left Intracranial: The petrous, cavernous and supraclinoid segments are widely patent. The left middle cerebral artery and the left anterior cerebral artery opacify normally into the capillary and venous phases. There is a prominent PCOM opacifying the bilateral posterior cerebral arteries via the distal basilar artery. Left Sided Anterior Venous: The venous phase demonstrates preferential egress of contrast into the right transverse sinus, the sigmoid sinus and the right internal jugular vein. There is opacification of the left transverse sigmoid sinuses and internal jugular veins seen as well. There is no early venous shunting into the venous structures at the skull base or intracranially into the dural sinuses. Indirect left vertebral and cerebral angiogram with injection of the left subclavian artery demonstrate faint opacification of the left vertebral artery. 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 High magnification biplane DSA cerebral angiography of right vertebral artery injection demonstrate severe about 90% stenosis at the right vertebrobasilar junction in length of about 7 mm. Post angioplasty angiogram demonstrate significant residual stenoses. Post stent angiogram demonstrate complete expansion of the stent with no residual stenosis. There is about 40-50% stenosis seen at the mid basilar artery. Right PICA, bilateral superior cerebellar, AICA and posterior cerebral arteries show moderate atheromatous disease. No significant stenosis. IMPRESSION: 1. Right vertebral and cerebral angiogram demonstrated  about 90% stenosis at the vertebrobasilar junction. Successful angioplasty and stent placement without significant residual stenosis. 2.  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 3. 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 4. Mild atheromatous disease in the right intracranial ICA with 20-30% stenosis at the supraclinoid ICA and 30% stenosis in the right M1 segment. There is about 70% stenoses of the right pericallosal branch of the ACA seen in the A3 segment. 5.  Bilateral P comms are patent with the left PCOM being prominent. PLAN: 1.  Blood pressure goals between 120-140 mm Hg. 2.  Patient to be on dual antiplatelet agents for 6 months. Electronically Signed   By: Frazier Richards M.D.   On: 08/09/2021 10:26   DG CHEST PORT 1 VIEW  Result Date: 08/09/2021 CLINICAL DATA:  Tracheostomy dependent. EXAM: PORTABLE CHEST 1 VIEW COMPARISON:  08/06/2021 FINDINGS: The tracheostomy tube tip is above the carina. Heart size and mediastinal contours are stable. Mild left lung base atelectasis with volume loss is stable from the previous exam. Right  lung is clear. IMPRESSION: Stable exam. Electronically Signed   By: Kerby Moors M.D.   On: 08/09/2021 08:47   DG Chest Port 1 View  Result Date: 08/06/2021 CLINICAL DATA:  Status post tracheostomy EXAM: PORTABLE CHEST 1 VIEW COMPARISON:  None Available. FINDINGS: Tracheostomy tube with tip in the proximal trachea in appropriate position. New mild LEFT basilar atelectasis. Mild central venous congestion. IMPRESSION: Tracheostomy tube in good position. New LEFT basilar atelectasis. Electronically Signed   By: Suzy Bouchard M.D.   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 He presented with progressively worsening weakness in the right lower extremity, left lower extremity, right upper extremity, and left upper extremity. On the morning of the procedure, patient was flaccid at the right-sided extremities and had severe weakness in the left-sided extremities (grade 1/5). 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 There was occlusion of the left vertebral artery seen distal to the origin of the PICA. 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 EXAM: 1.  Biplane right carotid and cerebral DSA angiogram. 2.  Biplane left common carotid and cerebral DSA cerebral angiogram 3. Indirect left vertebral and cerebral angiogram with tip of the catheter at the origin of the left vertebral artery 4.  Right subclavian arteriogram. 5. Selective catheterization of the right vertebral artery, biplane vertebral and cerebral DSA cerebral angiogram. 6. Selective catheterization of the right vertebral artery by using the 088 neuron max guide catheter, 5 French AXS catalyst catheter as an intermediate catheter and 021 phenom microcatheter with a 014 [place]otle microwire. 7. High magnification biplane cerebral angiogram and exchange of the microcatheter over an exchange length 014 zoom wire into an Apex 2.25 x 15 mm monorail balloon catheter and angioplasty of the severely 90% stenotic right vertebrobasilar junction. 8. Resolute onyx 3 mm x 22 mm stent placement at the site of the stenosis. 9. Post stent biplane DSA cerebral angiography of the right vertebral artery injection. 10. Right common femoral arteriogram in the RAO projection after withdrawing the sheath with its tip in the external iliac artery and arteriogram Attending: Dr. Frazier Richards Assistant: Dr. Luanne Bras  COMPARISON:  CORRELATION IS MADE WITH CTA OF THE HEAD AND NECK DATED August 03, 2021 MEDICATIONS: The antibiotic was administered within 1 hour of the procedure Injection Ancef 2 g Injection Integrilin 4.5 mg at 1.5 mg increments intra-arterially through the right vertebral artery Heparin 3500 units ANESTHESIA/SEDATION: Anesthesia was provided by the anesthesia team. Please refer to the anesthesia notes for detailed information. CONTRAST:  126 mL of Omnipaque 300 FLUOROSCOPY: Fluoroscopy Time: 48 minutes 48 seconds (3499 mGy). COMPLICATIONS: None immediate. TECHNIQUE: Informed written consent was obtained from the patient and his daughter after a thorough discussion of the procedural risks including but not limited to approximately 5% bleeding, hematoma, vascular injury, worsening of the stroke, ventilator dependency etc. Benefits and alternatives. All questions were addressed. Maximal Sterile Barrier Technique was utilized including caps, mask, sterile gowns, sterile gloves, sterile drape, hand hygiene and skin antiseptic. A timeout was performed prior to the initiation of the procedure. PROCEDURE: The right groin  was prepped and draped in the usual sterile fashion. Thereafter using modified Seldinger technique, transfemoral access into the right common femoral artery was obtained without difficulty. Over a 0.035 inch guidewire, an 8 French x 25 cm pinnacle sheath was inserted. Through this, and also over 0.035 inch roadrunner wire, a 5 [place] JB 1 catheter was advanced to the aortic arch region and selectively positioned in the right common carotid artery, and biplane DSA carotid and cerebral angiogram was obtained. Next, with the catheter tip in the left common carotid artery, biplane DSA carotid and cerebral Angiography of the head was obtained. The, selective catheterization of the left subclavian artery followed by angiogram was performed. With the tip of the catheter at the origin of the left vertebral artery,  indirect left vertebral and biplane DSA cerebral angiography of left subclavian arterial injection was performed. Next, selective catheterization of the right subclavian artery was performed and the catheter was advanced into the proximal subclavian artery. Next, the wire was exchanged for an exchange length 035 Rosen wire. The JB 1 catheter was removed and 088 neuron max guide catheter and 5 French AXS catalyst catheter were advanced and selective catheterization of the left vertebral artery followed by vertebral and cerebral angiogram was performed. Next, under high magnification angiography, and under roadmap guidance, phenom 21 microcatheter and 0 1 for restarting microwire were advanced into the left posterior cerebral artery and angiogram was performed. Next, the wire was exchanged for a 014 zoom exchange length wire. Subsequently, we selected 2.25 x 15 mm Apex monorail microcatheter and under roadmap guidance, angioplasty of the severely stenotic right vertebrobasilar artery was performed. Post angioplasty angiogram was performed. Next, the balloon was removed. Measurements were made. Next we selected 3 mm x 22 mm resolute onyx stent and the stent was deployed at the right vertebrobasilar stenotic site. The stent was gently dilated and was inflated up to 10 atmospheric pressure. Next, post stent biplane DSA cerebral angiogram was performed. At this point in time, we felt the goal of the procedure was obtained. The catheter was removed. Femoral access site was further sterilely cleaned and the femoral sheath was withdrawn with its tip in the right external iliac artery, femoral angiogram was performed in the RAO projection and the femoral access site was closed by using an 8 [place] Angio-Seal. Patient tolerated the procedure well and was transferred to the ICU intubated in stable condition. No immediate complications. FINDINGS: Right CCA: The right common carotid arteriogram demonstrates the right external  carotid artery and its major branches to be widely patent. Right ICA: The right internal carotid artery at the bulb to the cranial skull base opacifies normally. No significant stenosis. Right Intracranial: The petrous and cavernous segments are widely patent. Mild 20-30% stenosis at the supraclinoid ICA. The right middle cerebral artery and the right anterior cerebral artery opacify normally into the capillary and venous phases. Mild-to-moderate atheromatous disease with about 30% stenosis in the right M1 segment. There is severe about 70% stenosis of the right pericallosal branch of the ACA seen in the proximal aspect. There is cross filling via the anterior communicating artery seen with opacification of the left anterior cerebral artery. PCOM is patent with faint opacification of the right posterior cerebral artery. Right Sided Anterior Venous: The venous phase demonstrates preferential egress of contrast into the right transverse sinus, the sigmoid sinus and the right internal jugular vein. There is no early venous shunting into the venous structures at the skull base or intracranially into the  dural sinuses. Left CCA: The left common carotid arteriogram demonstrates the left external carotid artery and its major branches to be widely patent. Left ICA: The left internal carotid artery at the bulb to the cranial skull base opacifies normally. Mild atheromatous disease at the origin of the ICA. There is mild stenosis seen about 2 cm from the origin of the left ICA measuring 42 percent by the NASCET criteria. Left Intracranial: The petrous, cavernous and supraclinoid segments are widely patent. The left middle cerebral artery and the left anterior cerebral artery opacify normally into the capillary and venous phases. There is a prominent PCOM opacifying the bilateral posterior cerebral arteries via the distal basilar artery. Left Sided Anterior Venous: The venous phase demonstrates preferential egress of contrast  into the right transverse sinus, the sigmoid sinus and the right internal jugular vein. There is opacification of the left transverse sigmoid sinuses and internal jugular veins seen as well. There is no early venous shunting into the venous structures at the skull base or intracranially into the dural sinuses. Indirect left vertebral and cerebral angiogram with injection of the left subclavian artery demonstrate faint opacification of the left vertebral artery. 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 High magnification biplane DSA cerebral angiography of right vertebral artery injection demonstrate severe about 90% stenosis at the right vertebrobasilar junction in length of about 7 mm. Post angioplasty angiogram demonstrate significant residual stenoses. Post stent angiogram demonstrate complete expansion of the stent with no residual stenosis. There is about 40-50% stenosis seen at the mid basilar artery. Right PICA, bilateral superior cerebellar, AICA and posterior cerebral arteries show moderate atheromatous disease. No significant stenosis. IMPRESSION: 1. Right vertebral and cerebral angiogram demonstrated about 90% stenosis at the vertebrobasilar junction. Successful angioplasty and stent placement without significant residual stenosis. 2.  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 3. 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 4. Mild atheromatous disease in the right intracranial ICA with 20-30% stenosis at the supraclinoid ICA and 30% stenosis in the right M1 segment. There is about 70% stenoses of the right pericallosal branch of the ACA seen in the A3 segment. 5.  Bilateral P comms are patent with the left PCOM being prominent. PLAN: 1.  Blood pressure goals between 120-140 mm Hg. 2.  Patient to be on dual antiplatelet agents for 6 months. Electronically Signed   By: Frazier Richards M.D.   On: 08/05/2021 14:38   MR BRAIN WO  CONTRAST  Result Date: 08/05/2021 CLINICAL DATA:  66 year old male with history of known medullary infarct and severe vertebrobasilar disease, status post catheter directed revascularization and stenting. EXAM: MRI HEAD WITHOUT CONTRAST MRA HEAD WITHOUT CONTRAST TECHNIQUE: Multiplanar, multi-echo pulse sequences of the brain and surrounding structures were acquired without intravenous contrast. Angiographic images of the Circle of Willis were acquired using MRA technique without intravenous contrast. COMPARISON:  Comparison made with arteriogram from earlier the same day as well as multiple previous studies. FINDINGS: MRI HEAD FINDINGS Brain: There has been interval expansion of previously identified ventral medullary infarct, now extending posteriorly to traversed the entirety of the medulla to the floor of the fourth ventricle (series 5, images 63, 64, 65), slightly worse on the left. Additionally, there are new scattered patchy ischemic infarcts involving the right cerebellum. Few additional patchy small volume ischemic infarcts noted involving the cortical aspects of the right greater than left occipital lobes (series 5, images 79, 77, 75, 73,  72 no significant associated mass effect. Chronic right cerebellar microhemorrhage noted. There is a new small focus of susceptibility artifact within the adjacent right cerebellum, likely a punctate focus of petechial blood products (series 14, image 20). No other associated blood products. Remainder the brain is otherwise stable in appearance. Stable cerebral volume with chronic small vessel ischemic disease. No new focal parenchymal signal abnormality. Pituitary gland and suprasellar region remain within normal limits. Midline encephalomalacia with chronic hemosiderin staining at the anterior right frontal lobe again noted. Remote lacunar infarcts again noted about the cerebellum, pons, thalami, and left posterior corona radiata. No mass lesion, significant mass  effect, or midline shift. Stable ventricular size without hydrocephalus. No extra-axial fluid collection. Vascular: Susceptibility artifact now seen about the vertebrobasilar junction related to interval stenting. Major intracranial vascular flow voids are maintained. Skull and upper cervical spine: Craniocervical junction within normal limits. Bone marrow signal intensity normal. No new scalp soft tissue abnormality. Sinuses/Orbits: Globes and orbital soft tissues within normal limits. Moderate mucosal thickening throughout the paranasal sinuses with fluid within the nasopharynx. Patient is intubated. Mastoid air cells remain largely clear. Other: None. MRA HEAD FINDINGS Anterior circulation: Visualized distal cervical segments of the internal carotid arteries remain patent with antegrade flow. Petrous segments remain widely patent. Atheromatous irregularity about both carotid siphons with associated moderate stenosis at the supraclinoid left ICA. A1 segments patent. Normal anterior communicating artery complex. Left ACA remains patent. Distal severe right A2/A3 stenosis noted (series 1, image 164), stable. Left M1 segment remains patent. Mild to moderate long segment right M1 stenosis again noted. Negative MCA bifurcations. No proximal MCA branch occlusion. Distal MCA branches remain perfused and symmetric, although demonstrate mild small vessel atheromatous irregularity. Posterior circulation: Heterogeneous atheromatous irregularity again noted about both proximal V4 segments as they course into the cranial vault. Multifocal moderate stenoses involving the proximal left V4 segment again noted. Left V4 remains patent to the takeoff of the left PICA which remains patent and well perfused. Occlusion of the distal left V4 segment beyond the left PICA again noted, stable. On the right, there has been interval placement of a vascular stent extending from the distal right V4 segment across the vertebrobasilar junction  into the proximal basilar artery. This traverses the previously seen high-grade stenosis at this level. Grossly patent flow through the stent, although evaluation for possible intra stent stenosis limited by MRA. Stable and fairly robust flow seen within the basilar artery distally. Suspected short-segment fenestration noted at the distal basilar artery. Superior cerebellar arteries remain patent. Right PCA primarily supplied via the basilar. Left PCA supplied via the basilar as well as a robust left posterior communicating artery. Atheromatous disease involving both PCAs with associated severe bilateral P2 stenoses. PCAs remain patent to their distal aspects although demonstrate small vessel atheromatous irregularity. Anatomic variants: None significant. IMPRESSION: MRI HEAD IMPRESSION: 1. 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 Additionally, there are new scattered small volume ischemic infarcts involving the right cerebellum as well as the cortical aspects of the right greater than left occipital lobes. No associated mass effect. Single punctate focus of associated petechial hemorrhage at the right cerebellum as above. 2. Otherwise stable appearance of the brain with atrophy, chronic microvascular ischemic disease, and multiple chronic ischemic infarcts as above. MRA HEAD IMPRESSION: 1. Interval placement of a vascular stent extending from the distal right V4 segment across the vertebrobasilar junction into the proximal  basilar artery. Grossly patent flow through the stent, although evaluation for possible intra stent stenosis is limited by MRA. Stable and fairly robust flow seen within the basilar artery distally. 2. Otherwise stable appearance of the intracranial circulation with moderate multifocal stenoses involving the proximal left V4 segment with distal left V4 occlusion, severe right  A2/A3 stenosis, severe bilateral P2 stenoses, moderate supraclinoid left ICA stenosis, with mild to moderate long segment right M1 stenosis. Electronically Signed   By: Jeannine Boga M.D.   On: 08/05/2021 02:44   MR ANGIO HEAD WO CONTRAST  Result Date: 08/05/2021 CLINICAL DATA:  66 year old male with history of known medullary infarct and severe vertebrobasilar disease, status post catheter directed revascularization and stenting. EXAM: MRI HEAD WITHOUT CONTRAST MRA HEAD WITHOUT CONTRAST TECHNIQUE: Multiplanar, multi-echo pulse sequences of the brain and surrounding structures were acquired without intravenous contrast. Angiographic images of the Circle of Willis were acquired using MRA technique without intravenous contrast. COMPARISON:  Comparison made with arteriogram from earlier the same day as well as multiple previous studies. FINDINGS: MRI HEAD FINDINGS Brain: There has been interval expansion of previously identified ventral medullary infarct, now extending posteriorly to traversed the entirety of the medulla to the floor of the fourth ventricle (series 5, images 63, 64, 65), slightly worse on the left. Additionally, there are new scattered patchy ischemic infarcts involving the right cerebellum. Few additional patchy small volume ischemic infarcts noted involving the cortical aspects of the right greater than left occipital lobes (series 5, images 79, 77, 75, 73, 72 no significant associated mass effect. Chronic right cerebellar microhemorrhage noted. There is a new small focus of susceptibility artifact within the adjacent right cerebellum, likely a punctate focus of petechial blood products (series 14, image 20). No other associated blood products. Remainder the brain is otherwise stable in appearance. Stable cerebral volume with chronic small vessel ischemic disease. No new focal parenchymal signal abnormality. Pituitary gland and suprasellar region remain within normal limits. Midline  encephalomalacia with chronic hemosiderin staining at the anterior right frontal lobe again noted. Remote lacunar infarcts again noted about the cerebellum, pons, thalami, and left posterior corona radiata. No mass lesion, significant mass effect, or midline shift. Stable ventricular size without hydrocephalus. No extra-axial fluid collection. Vascular: Susceptibility artifact now seen about the vertebrobasilar junction related to interval stenting. Major intracranial vascular flow voids are maintained. Skull and upper cervical spine: Craniocervical junction within normal limits. Bone marrow signal intensity normal. No new scalp soft tissue abnormality. Sinuses/Orbits: Globes and orbital soft tissues within normal limits. Moderate mucosal thickening throughout the paranasal sinuses with fluid within the nasopharynx. Patient is intubated. Mastoid air cells remain largely clear. Other: None. MRA HEAD FINDINGS Anterior circulation: Visualized distal cervical segments of the internal carotid arteries remain patent with antegrade flow. Petrous segments remain widely patent. Atheromatous irregularity about both carotid siphons with associated moderate stenosis at the supraclinoid left ICA. A1 segments patent. Normal anterior communicating artery complex. Left ACA remains patent. Distal severe right A2/A3 stenosis noted (series 1, image 164), stable. Left M1 segment remains patent. Mild to moderate long segment right M1 stenosis again noted. Negative MCA bifurcations. No proximal MCA branch occlusion. Distal MCA branches remain perfused and symmetric, although demonstrate mild small vessel atheromatous irregularity. Posterior circulation: Heterogeneous atheromatous irregularity again noted about both proximal V4 segments as they course into the cranial vault. Multifocal moderate stenoses involving the proximal left V4 segment again noted. Left V4 remains patent to the takeoff of the left PICA which remains  patent and well  perfused. Occlusion of the distal left V4 segment beyond the left PICA again noted, stable. On the right, there has been interval placement of a vascular stent extending from the distal right V4 segment across the vertebrobasilar junction into the proximal basilar artery. This traverses the previously seen high-grade stenosis at this level. Grossly patent flow through the stent, although evaluation for possible intra stent stenosis limited by MRA. Stable and fairly robust flow seen within the basilar artery distally. Suspected short-segment fenestration noted at the distal basilar artery. Superior cerebellar arteries remain patent. Right PCA primarily supplied via the basilar. Left PCA supplied via the basilar as well as a robust left posterior communicating artery. Atheromatous disease involving both PCAs with associated severe bilateral P2 stenoses. PCAs remain patent to their distal aspects although demonstrate small vessel atheromatous irregularity. Anatomic variants: None significant. IMPRESSION: MRI HEAD IMPRESSION: 1. 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 Additionally, there are new scattered small volume ischemic infarcts involving the right cerebellum as well as the cortical aspects of the right greater than left occipital lobes. No associated mass effect. Single punctate focus of associated petechial hemorrhage at the right cerebellum as above. 2. Otherwise stable appearance of the brain with atrophy, chronic microvascular ischemic disease, and multiple chronic ischemic infarcts as above. MRA HEAD IMPRESSION: 1. Interval placement of a vascular stent extending from the distal right V4 segment across the vertebrobasilar junction into the proximal basilar artery. Grossly patent flow through the stent, although evaluation for possible intra stent stenosis is limited by MRA. Stable and  fairly robust flow seen within the basilar artery distally. 2. Otherwise stable appearance of the intracranial circulation with moderate multifocal stenoses involving the proximal left V4 segment with distal left V4 occlusion, severe right A2/A3 stenosis, severe bilateral P2 stenoses, moderate supraclinoid left ICA stenosis, with mild to moderate long segment right M1 stenosis. Electronically Signed   By: Jeannine Boga M.D.   On: 08/05/2021 02:44   DG Abd 1 View  Result Date: 08/04/2021 CLINICAL DATA:  NG tube placement EXAM: ABDOMEN - 1 VIEW COMPARISON:  10/11/2012 FINDINGS: Tip of NG tube is seen in the region of the antrum of the stomach. Bowel gas pattern is nonspecific. Pelvis is not included in its entirety. IMPRESSION: Tip of NG tube is seen in the stomach. Electronically Signed   By: Elmer Picker M.D.   On: 08/04/2021 17:32   DG Chest Port 1 View  Result Date: 08/04/2021 CLINICAL DATA:  Intubated EXAM: PORTABLE CHEST 1 VIEW COMPARISON:  08/04/2021 FINDINGS: Endotracheal tube tip is about 4.7 cm superior to the carina. Subsegmental atelectasis at the left base. Cardiomegaly. No consolidation, pleural effusion or pneumothorax IMPRESSION: 1. Endotracheal tube tip about 4.7 cm superior to the carina. 2. Cardiomegaly.  Subsegmental atelectasis at the left base. Electronically Signed   By: Donavan Foil M.D.   On: 08/04/2021 15:10   DG Chest Port 1 View  Result Date: 08/04/2021 CLINICAL DATA:  Endotracheal intubation EXAM: PORTABLE CHEST 1 VIEW COMPARISON:  Earlier same day FINDINGS: Endotracheal tube tip 6 cm above the carina. Cardiomegaly persists. Pulmonary venous hypertension, possibly with early interstitial edema. Right lateral chest not included on the image. No evidence of collapse or effusion. IMPRESSION: Endotracheal tube tip 6 cm above the carina. Electronically Signed   By: Nelson Chimes M.D.   On: 08/04/2021 13:42   ECHOCARDIOGRAM COMPLETE  Result  Date: 08/04/2021     ECHOCARDIOGRAM REPORT   Patient Name:   SAVA PROBY Date of Exam: 08/03/2021 Medical Rec #:  [id number]   Height:       67.0 in Accession #:    [id number]  Weight:       185.0 lb Date of Birth:  [birth year]   BSA:          1.956 m Patient Age:    20 years    BP:           142/79 mmHg Patient Gender: M           HR:           76 bpm. Exam Location:  [facility] Procedure: 2D Echo, Cardiac Doppler and Color Doppler Indications:     I63.9 Stroke  History:         Patient has no prior history of Echocardiogram examinations.                  Risk Factors:Diabetes, Dyslipidemia, Hypertension and Former                  Smoker.  Sonographer:     Rosalia Hammers Referring Phys:  [id number] Athena Masse Diagnosing Phys: [place]  1. Left ventricular ejection fraction, by estimation, is 60 to 65%. The left ventricle has normal function. The left ventricle has no regional wall motion abnormalities. There is moderate concentric left ventricular hypertrophy. Left ventricular diastolic parameters are consistent with Grade I diastolic dysfunction (impaired relaxation).  2. Right ventricular systolic function is normal. The right ventricular size is normal.  3. The mitral valve is normal in structure. Trivial mitral valve regurgitation. No evidence of mitral stenosis.  4. The aortic valve is normal in structure. Aortic valve regurgitation is mild. No aortic stenosis is present.  5. The inferior vena cava is normal in size with greater than 50% respiratory variability, suggesting right atrial pressure of 3 mmHg. FINDINGS  Left Ventricle: Left ventricular ejection fraction, by estimation, is 60 to 65%. The left ventricle has normal function. The left ventricle has no regional wall motion abnormalities. The left ventricular internal cavity size was normal in size. There is  moderate concentric left ventricular hypertrophy. Left ventricular diastolic parameters are consistent with Grade I diastolic dysfunction (impaired relaxation).  Right Ventricle: The right ventricular size is normal. No increase in right ventricular wall thickness. Right ventricular systolic function is normal. Left Atrium: Left atrial size was normal in size. Right Atrium: Right atrial size was normal in size. Pericardium: There is no evidence of pericardial effusion. Mitral Valve: The mitral valve is normal in structure. Trivial mitral valve regurgitation. No evidence of mitral valve stenosis. Tricuspid Valve: The tricuspid valve is normal in structure. Tricuspid valve regurgitation is trivial. No evidence of tricuspid stenosis. Aortic Valve: The aortic valve is normal in structure. Aortic valve regurgitation is mild. Aortic regurgitation PHT measures 1577 msec. No aortic stenosis is present. Aortic valve mean gradient measures 5.0 mmHg. Aortic valve peak gradient measures 9.1 mmHg. Aortic valve area, by VTI measures 2.30 cm. Pulmonic Valve: The pulmonic valve was normal in structure. Pulmonic valve regurgitation is not visualized. No evidence of pulmonic stenosis. Aorta: The aortic root is normal in size and structure. Venous: The inferior vena cava is normal in size with greater than 50% respiratory variability, suggesting right atrial pressure of 3 mmHg. IAS/Shunts: No atrial level shunt detected by color flow Doppler.  LEFT VENTRICLE PLAX 2D LVIDd:  3.84 cm   Diastology LVIDs:         2.17 cm   LV e' medial:    6.39 cm/s LV PW:         1.65 cm   LV E/e' medial:  9.6 LV IVS:        1.18 cm   LV e' lateral:   7.83 cm/s LVOT diam:     1.90 cm   LV E/e' lateral: 7.8 LV SV:         74 LV SV Index:   38 LVOT Area:     2.84 cm  RIGHT VENTRICLE RV Basal diam:  3.15 cm RV S prime:     17.40 cm/s TAPSE (M-mode): 2.4 cm LEFT ATRIUM             Index        RIGHT ATRIUM           Index LA diam:        3.20 cm 1.64 cm/m   RA Area:     18.10 cm LA Vol (A2C):   72.4 ml 37.01 ml/m  RA Volume:   47.10 ml  24.08 ml/m LA Vol (A4C):   65.8 ml 33.63 ml/m LA Biplane Vol: 69.3  ml 35.42 ml/m  AORTIC VALVE AV Area (Vmax):    2.10 cm AV Area (Vmean):   2.06 cm AV Area (VTI):     2.30 cm AV Vmax:           151.00 cm/s AV Vmean:          100.000 cm/s AV VTI:            0.323 m AV Peak Grad:      9.1 mmHg AV Mean Grad:      5.0 mmHg LVOT Vmax:         112.00 cm/s LVOT Vmean:        72.600 cm/s LVOT VTI:          0.262 m LVOT/AV VTI ratio: 0.81 AI PHT:            1577 msec  AORTA Ao Root diam: 3.20 cm MITRAL VALVE MV Area (PHT): 3.20 cm     SHUNTS MV Decel Time: 237 msec     Systemic VTI:  0.26 m MV E velocity: 61.30 cm/s   Systemic Diam: 1.90 cm MV A velocity: 101.00 cm/s MV E/A ratio:  0.61 Shaukat Khan Electronically signed by Neoma Laming Signature Date/Time: 08/04/2021/8:55:17 AM    Final    MR BRAIN WO CONTRAST  Result Date: 08/03/2021 CLINICAL DATA:  66 year old male with severe intracranial atherosclerosis and questionable medullary ischemia on brain MRI yesterday. EXAM: MRI HEAD WITHOUT CONTRAST TECHNIQUE: Multiplanar, multiecho pulse sequences of the brain and surrounding structures were obtained without intravenous contrast. COMPARISON:  CTA head and neck this morning.  Brain MRI yesterday. FINDINGS: Brain: More convincing appearance of ventral medullary restricted diffusion, asymmetrically greater to the left of midline. See series 5, image 12, series 7, images 18 and 19. Mild if any associated T2 and FLAIR hyperintensity there. And no other convincing diffusion restriction despite the CTA findings earlier today. 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 Chronic encephalomalacia right inferior frontal gyrus with hemosiderin. Occasional chronic microhemorrhages elsewhere, right cerebellum (series 13, image 19). No new signal abnormality compared to yesterday. No midline shift, mass effect, evidence of mass lesion, ventriculomegaly, extra-axial collection or acute intracranial hemorrhage. Cervicomedullary junction  and  pituitary are within normal limits. Vascular: Major intracranial vascular flow voids are stable from yesterday. See also CTA today reported separately. Skull and upper cervical spine: Negative. Visualized bone marrow signal is within normal limits. Sinuses/Orbits: Stable, negative. Other: Visible internal auditory structures appear normal. Mastoids are clear. Negative visible scalp and face. IMPRESSION: 1. Positive for ischemia/infarction of the ventral Medulla as suspected yesterday, slightly greater on the left. No associated hemorrhage or mass effect. 2. 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 These results were communicated to Dr. Rory Percy at 11:06 am on 08/03/2021 by text page via the [facility] messaging system. Electronically Signed   By: Genevie Ann M.D.   On: 08/03/2021 11:06   CT ANGIO HEAD NECK W WO CM  Addendum Date: 08/03/2021   ADDENDUM REPORT: 08/03/2021 09:17 ADDENDUM: Study discussed by telephone with Dr. Amie Portland on 08/03/2021 at 0911 hours. Electronically Signed   By: Genevie Ann M.D.   On: 08/03/2021 09:17   Result Date: 08/03/2021 CLINICAL DATA:  66 year old male with neurologic deficit. Questionable small vessel ischemia of the medulla on brain MRI yesterday. EXAM: CT ANGIOGRAPHY HEAD AND NECK TECHNIQUE: Multidetector CT imaging of the head and neck was performed using the standard protocol during bolus administration of intravenous contrast. Multiplanar CT image reconstructions and MIPs were obtained to evaluate the vascular anatomy. Carotid stenosis measurements (when applicable) are obtained utilizing NASCET criteria, using the distal internal carotid diameter as the denominator. RADIATION DOSE REDUCTION: This exam was performed according to the departmental dose-optimization program which includes automated exposure control, adjustment of the mA and/or kV  according to patient size and/or use of iterative reconstruction technique. CONTRAST:  26m OMNIPAQUE IOHEXOL 350 MG/ML SOLN COMPARISON:  Brain MRI yesterday.  Head CT yesterday. FINDINGS: CT HEAD Brain: Bulky Calcified atherosclerosis at the skull base. Stable CT appearance of the brainstem since yesterday. No acute intracranial hemorrhage identified. No midline shift, mass effect, or evidence of intracranial mass lesion. Chronic right inferior frontal gyrus encephalomalacia, patchy bilateral cerebral white matter hypodensity more pronounced on the left. 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 No ventriculomegaly or acute cortically based infarct. Calvarium and skull base: No acute osseous abnormality identified. Paranasal sinuses: Visualized paranasal sinuses and mastoids are stable and well aerated. Orbits: Visualized orbits and scalp soft tissues are within normal limits. CTA NECK Skeleton: Periapical dental lucency bilaterally. Ordinary cervical spine degeneration. No acute osseous abnormality identified. Upper chest: Centrilobular and paraseptal emphysema. No superior mediastinal lymphadenopathy. Other neck: 15 mm hypodense right thyroid nodule. Recommend thyroid [place](ref: J Am Coll Radiol. 2015 Feb;12(2): 143-50).Dermal and subcutaneous probable 2.6 cm sebaceous cyst of the left submandibular face (series 13, image 79). Otherwise negative CT appearance of the neck soft tissues. Aortic arch: 3 vessel arch configuration. Mild arch atherosclerosis. Right carotid system: Negative brachiocephalic artery and proximal right CCA. Retropharyngeal course of the right carotid bifurcation with mild calcified plaque. Tortuous right ICA distal to the bulb, no stenosis. Left carotid system: Similar mild tortuosity and atherosclerosis with no hemodynamically significant stenosis. Vertebral arteries: Tortuous proximal right subclavian artery with a mildly kinked appearance in the superior mediastinum.  Normal right vertebral artery origin. Patent right vertebral artery to the skull base with no significant plaque or stenosis. Mild to moderate soft and calcified plaque in the proximal left subclavian artery with less than 50 % stenosis with respect to the distal  vessel. Mild atherosclerosis and stenosis at the left vertebral artery origin on series 13, image 159. Non dominant left vertebral artery than is patent to the skull base with no additional plaque or stenosis. CTA HEAD Posterior circulation: Bilateral V4 segment calcified plaque. Severe stenosis on the left (series 12, image 139) upstream of the left PICA origin which remains patent. But the left V4 segment is occluded distal to the left PICA. Contralateral right V4 moderate tandem stenoses upstream of the patent right PICA origin on series 12, image 130. And severe stenosis, near occlusion of the right vertebrobasilar junction (series 16, image 26). The basilar artery remains patent but is moderately stenotic in the mid basilar segment on series 16, image 24. Distal basilar remains patent along with SCA and PCA origins. Left posterior communicating artery is present, the right is diminutive or absent. a mild to moderate stenosis left PCA origin. And bilateral P2 segment severe stenosis, near occlusion on series 15, image 53. But there is preserved distal PCA branch enhancement bilaterally. Anterior circulation: Both ICA siphons are patent. However, on the left there is bulky soft plaque or thrombus in the supraclinoid ICA (series 14, image 162) this is between normal appearing left ophthalmic and posterior communicating artery origins. Left carotid terminus remains patent. Contralateral right ICA siphon with mild to moderate calcified plaque and mild supraclinoid stenosis. Patent carotid termini, MCA and ACA origins. Tortuous A1 segments. Normal anterior communicating artery. Proximal A2 segments are normal. There is severe right ACA distal A2 segment  stenosis, near occlusion on series 17, image 32. Left MCA M1 segment is mildly irregular. Left MCA bifurcation is patent without stenosis. Left MCA branches are mildly irregular. Mild to moderate somewhat long segment right MCA M1 stenosis along 6 mm series 16, image 20. Distal M1 and right MCA trifurcation remain patent. Right MCA branches are mildly irregular. Venous sinuses: Early contrast timing, grossly patent. Anatomic variants: Mildly dominant right vertebral artery. Review of the MIP images confirms the above findings IMPRESSION: 1. 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 - Moderate stenosis Mid Basilar Artery. - Near Occlusion bilateral PCA P2 segments, distal right ACA A2. - bulky soft plaque or thrombus in the Supraclinoid Left ICA, with up to Moderate stenosis. - mild-to-moderate Right MCA M1 stenosis. 2. Stable CT appearance of the brain since yesterday. No new intracranial abnormality. 3. Aortic Atherosclerosis (ICD10-I70.0) and Emphysema (ICD10-J43.9). Electronically Signed: By: Genevie Ann M.D. On: 08/03/2021 09:01   US Carotid Bilateral (at [facility] and [facility] only)  Result Date: 08/03/2021 CLINICAL DATA:  Follow-up examination for stroke. EXAM: BILATERAL CAROTID DUPLEX ULTRASOUND TECHNIQUE: Pearline Cables scale imaging, color Doppler and duplex ultrasound were performed of bilateral carotid and vertebral arteries in the neck. COMPARISON:  Prior Study from 04/03/2014. FINDINGS: Criteria: Quantification of carotid stenosis is based on velocity parameters that correlate the residual internal carotid diameter with NASCET-based stenosis levels, using the diameter of the distal internal carotid lumen as the denominator for stenosis measurement. The following velocity measurements were obtained: RIGHT ICA: 84/10 cm/sec CCA: 41/66 cm/sec SYSTOLIC ICA/CCA RATIO:  1.1 ECA: 64 cm/sec  LEFT ICA: 64/16 cm/sec CCA: 063/01 cm/sec SYSTOLIC ICA/CCA RATIO:  0.9 ECA: 117 cm/sec RIGHT CAROTID ARTERY: Mild smooth intimal thickening seen within the visualized right CCA without significant stenosis. Mild heterogeneous mural based echogenic plaque seen about the right carotid bulb. No significant elevation in peak systolic velocity to  suggest hemodynamically significant greater than 50% stenosis. Visualized right ICA patent distally without stenosis. RIGHT VERTEBRAL ARTERY:  Patent with antegrade flow. LEFT CAROTID ARTERY: Diffuse smooth intimal thickening seen within the visualized left CCA without significant stenosis. Mild scattered heterogeneous echogenic plaque about the left carotid bulb/proximal left ICA. No significant elevation in peak systolic velocity to suggest hemodynamically significant greater than 50% stenosis. Visualized left ICA patent distally without visible stenosis. LEFT VERTEBRAL ARTERY:  Patent with antegrade flow. IMPRESSION: 1. Mild atherosclerotic plaque within the carotid bulbs and proximal ICAs bilaterally, but no hemodynamically significant greater than 50% stenosis. 2. Patent antegrade flow within both vertebral arteries. Electronically Signed   By: Jeannine Boga M.D.   On: 08/03/2021 03:26   MR Cervical Spine Wo Contrast  Result Date: 08/03/2021 CLINICAL DATA:  Initial evaluation for ataxia, right-sided numbness. EXAM: MRI CERVICAL SPINE WITHOUT CONTRAST TECHNIQUE: Multiplanar, multisequence MR imaging of the cervical spine was performed. No intravenous contrast was administered. COMPARISON:  None available. FINDINGS: Alignment: Examination degraded by motion artifact. Straightening with mild reversal of the normal cervical lordosis. No listhesis. Vertebrae: Vertebral body height maintained without acute or chronic fracture. Bone marrow signal intensity within normal limits. No discrete or worrisome osseous lesions or abnormal marrow edema. Cord: Normal signal and  morphology. No convincing cord signal changes on this motion degraded exam. Posterior Fossa, vertebral arteries, paraspinal tissues: Unremarkable. Disc levels: C2-C3: Unremarkable. C3-C4: Mild disc bulge with endplate and uncovertebral spurring. Flattening and partial effacement of the ventral thecal sac with resultant mild spinal stenosis. Moderate bilateral C4 foraminal narrowing. C4-C5: Mild disc bulge with uncovertebral spurring. Mild flattening of the ventral thecal sac without significant spinal stenosis. Moderate right with mild left C5 foraminal stenosis. C5-C6: Minimal disc bulge with uncovertebral spurring. No spinal stenosis. Mild right C6 foraminal narrowing. Left neural foramen remains patent. C6-C7: Minimal annular disc bulge. No spinal stenosis. Foramina remain patent. C7-T1:  Unremarkable. Visualized upper thoracic spine demonstrates no significant finding. IMPRESSION: 1. Motion degraded exam. 2. No acute abnormality within the cervical spine. 3. Mild multilevel cervical spondylosis with resultant mild spinal stenosis at C3-4. Associated moderate bilateral C4 foraminal stenosis, with mild right C5 and C6 foraminal narrowing. Electronically Signed   By: Jeannine Boga M.D.   On: 08/03/2021 01:04   MR BRAIN WO CONTRAST  Result Date: 08/03/2021 CLINICAL DATA:  Initial evaluation for neuro deficit, stroke suspected. EXAM: MRI HEAD WITHOUT CONTRAST TECHNIQUE: Multiplanar, multiecho pulse sequences of the brain and surrounding structures were obtained without intravenous contrast. COMPARISON:  Prior CT from earlier the same day as well as previous MRI from 09/17/2015. FINDINGS: Brain: Generalized age-related cerebral atrophy. Patchy T2/FLAIR hyperintensity involving the periventricular and deep white matter both cerebral hemispheres as well as the pons, most consistent with chronic small vessel ischemic disease, moderately advanced in nature. Area of encephalomalacia of involving parasagittal  anterior/inferior right frontal lobe noted at site of prior hemorrhage. Additional smaller foci of encephalomalacia involving the inferior temporal poles bilaterally likely posttraumatic in nature. Few scattered small remote left cerebellar infarcts noted. Subtle area of diffusion signal abnormality involving the ventral medulla measuring up to approximately 8 mm is seen (a series 5, image 12). While this finding could potentially be artifactual, possibly due to prominent vascular calcifications about the adjacent vertebral arteries, a possible small acute ischemic infarct could be considered. No associated hemorrhage or mass effect. No other evidence for acute or subacute ischemia. No acute intracranial hemorrhage. Single chronic microhemorrhage noted within the right cerebellum,  of doubtful significance in isolation. No mass lesion, midline shift or mass effect. No hydrocephalus or extra-axial fluid collection. Pituitary gland and suprasellar region within normal limits. Vascular: Major intracranial vascular flow voids are maintained. Skull and upper cervical spine: Craniocervical junction within normal limits. Bone marrow signal intensity normal. No scalp soft tissue abnormality. Sinuses/Orbits: Globes and orbital soft tissues within normal limits. Paranasal sinuses are largely clear. No mastoid effusion. Other: None. IMPRESSION: 1. 8 mm focus of diffusion signal abnormality involving the ventral medulla, indeterminate. While this finding could potentially be artifactual in nature, a possible small acute ischemic infarct is difficult to exclude, and could be considered in the correct clinical setting. No associated hemorrhage or mass effect. 2. No other acute intracranial abnormality. 3. Underlying atrophy with moderate chronic small vessel ischemic disease with a few small remote left cerebellar infarcts. Area of encephalomalacia of involving the anterior/inferior right frontal lobe at site of previous  hemorrhage. Electronically Signed   By: Jeannine Boga M.D.   On: 08/03/2021 00:59   CT Head Wo Contrast  Result Date: 08/02/2021 CLINICAL DATA:  New right upper extremity tremor. Tingling to the arm. EXAM: CT HEAD WITHOUT CONTRAST TECHNIQUE: Contiguous axial images were obtained from the base of the skull through the vertex without intravenous contrast. RADIATION DOSE REDUCTION: This exam was performed according to the departmental dose-optimization program which includes automated exposure control, adjustment of the mA and/or kV according to patient size and/or use of iterative reconstruction technique. COMPARISON:  MRI brain 09/17/2015 FINDINGS: Brain: Mild cerebral atrophy. Mild ventricular dilatation consistent with central atrophy. Patchy low-attenuation changes in the deep white matter consistent small vessel ischemic change. Old area of encephalomalacia in the right anterior frontal lobe corresponding to previous intraparenchymal hemorrhage on prior MRI. No acute mass effect or midline shift. No abnormal extra-axial fluid collections. Gray-white matter junctions are distinct. Basal cisterns are not effaced. No acute intracranial hemorrhage. Vascular: No hyperdense vessel or unexpected calcification. Skull: Normal. Negative for fracture or focal lesion. Sinuses/Orbits: Paranasal sinuses and mastoid air cells are clear. Other: None. IMPRESSION: No acute intracranial abnormalities. Chronic atrophy and small vessel ischemic changes. 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 Electronically Signed   By: Lucienne Capers M.D.   On: 08/02/2021 21:20   DG Foot Complete Right  Result Date: 08/02/2021 CLINICAL DATA:  Chronic RIGHT arm and leg pain for since Sunday, RIGHT leg cold to touch, swelling RIGHT great toe EXAM: RIGHT FOOT COMPLETE - 3+ VIEW COMPARISON:  None Available. FINDINGS: Osseous mineralization normal. 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  Remaining joint spaces preserved. No acute fracture, dislocation, or bone destruction. Small plantar calcaneal spur. IMPRESSION: Degenerative changes first MTP joint and small plantar calcaneal spur. No acute osseous abnormalities. Electronically Signed   By: Lavonia Dana M.D.   On: 08/02/2021 20:51     PHYSICAL EXAM  Temp:  [99.9 F (37.7 C)-100.9 F (38.3 C)] 99.9 F (37.7 C) (08/04 0800) Pulse Rate:  [68-108] 90 (08/04 1200) Resp:  [12-42] 12 (08/04 1200) BP: (115-200)/(63-97) 151/77 (08/04 1200) SpO2:  [95 %-99 %] 98 % (08/04 1200) FiO2 (%):  [40 %] 40 % (08/04 1109)  General - Well nourished, well developed, middle-aged African-American male on Precedex, Fentanyl drip and mechanical ventilator in the ICU. Respiratory- ETT in place  Neuro -ETT tube in place - Eyes open spontaneously and to voice. Following midline commands, eyes in mid position but will track throughout visual fields. No movement in  all extremities with painful stimuli. Dense quadriplegia and cannot move extremities.  ASSESSMENT/PLAN Joshua Donovan is a 66 y.o. male with history of remote TBI with ICH, hypertension, hyperlipidemia, diabetes admitted for right leg/foot weakness and pain, right hand numbness, imbalance.  Gradually developed left leg weakness also during admission.  No tPA given due to outside window.  Quadriplegia on examination, attempt at extubation failed with immediate reintubation. Trach and PEG done 7/28. Significant bleeding from trach with bronchoscopy and oral reintubation on 7/31. Trach revision planned for 8/2. 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 Held for trach revision and OR today.  Ongoing aggressive stroke risk factor management Therapy recommendations: SNF vs. 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 Grossly patent flow through the stent, although evaluation for possible intra stent stenosis is limited by MRA  Respiratory failure Reintubated right after extubation Trach 7/28 Weaning on ventilator- PCCM is primary Concern for evolving pneumonia on 5-day course of ceftriaxone Intratracheal bleeding associated with trach 7/31, reintubated 2/2 inability to pass tracheostomy tube through stoma ENT trach revision planned for 8/2  Diabetes HgbA1c 8.3 goal < 7.0 Controlled CBG monitoring SSI DM education and close PCP follow up  Hypertension Stable BP goal < 180/105 Discontinued Cleviprex On home Norvasc 10 mg Previously documented allergy to Lisinopril with facial swelling, Lisinopril discontinued Metoprolol increased to '25mg'$  BID, HCTZ reinitiated with improved renal function, hydralazine PRN added for further blood pressure control Continue clonidine 0.'1mg'$  TID for BP control Long term BP goal normotensive  Hyperlipidemia Home meds: Lipitor 20 LDL 116, goal < 70 Now on Lipitor 80 Continue statin at discharge  Other Stroke Risk  Factors Advanced age Former cigarette smoker quit smoking 33 years ago ETOH use, 1 drink per day  Other Active Problems Remote TBI with traumatic ICH  Continue ventilatory support and wean to trach collar as tolerated as per critical care team for respiratory failure.  This may be slow due to respiratory muscle atrophy per CCM and he may need to consider going to [facility] for slow weaning.  No family available at the bedside for discussion.  Discussed with Dr. Lynetta Mare critical care medicine.This patient is critically ill and at significant risk of neurological worsening, death and care requires constant monitoring of vital signs, hemodynamics,respiratory and cardiac monitoring, extensive review of multiple databases, frequent neurological assessment, discussion with family, other specialists and medical decision making of high complexity.I have made any additions or clarifications directly to the above note.This critical care time  does not reflect procedure time, or teaching time or supervisory time of PA/NP/Med Resident etc but could involve care discussion time.  I spent 30  minutes of neurocritical care time  in the care of  this patient.  Resume aspirin and Brilinta for 6 months followed by aspirin alone and aggressive risk factor modification.  Stroke team will sign off.  Kindly call for questions.   Antony Contras, MD Medical Director [place] Pager: [phone] 08/13/2021 1:07 PM  To contact Stroke Continuity provider, please refer to [email or web address] After hours, contact General Neurology

## 2021-08-13 NOTE — Progress Notes (Signed)
NAME:  Joshua Donovan, MRN:  [id number], DOB:  04-24-1955, LOS: 20 ADMISSION DATE:  08/03/2021, CONSULTATION DATE:  08/04/21 REFERRING MD:  Dr. Gerhard Perches, CHIEF COMPLAINT:  CVA   History of Present Illness:  66 year old man who presented to [facility] 7/24 with right hand numbness, right LE pain and weakness.  PMHx significant for HTN, TBI with prior ICH, DM, and HLD.  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 Patient's weakness worsened to include right arm, right leg and left leg.  CTA Head/Neck revealed multiple severe intracranial stenoses, near-occlusive stenosis of the vertebrobasilar junction, occlusion of the distal left V4, moderate mid basilar stenosis, nearly occlusive bilateral PCA P2 segments, nearly occlusive distal right ACA A2 segments and bulky soft plaque or thrombus in the supraclinoid left ICA with mild to moderate right M1 stenosis.    Subsequently transferred to [facility] for further stroke care/Neuro IR evaluation 7/25.  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  Additionally, occlusion of left V4 distal to origin of PICA and patent bilateral P-comm arteries noted.  Heparin gtt and Plavix initiated.  Remains on Cleviprex gtt for strict SBP parameters.  Patient left intubated.  PCCM consulted for further ventilator management.  Patient underwent percutaneous tracheostomy placement 7/28 c/b bleeding (7/31) requiring bronchoscopic evaluation, oral reintubation and tracheostomy removal with stoma packing. ENT consulted.  Taken to OR afternoon 8/2 for tracheostomy redo by ENT.  Pertinent Medical History:  TBI with ICH, DM, HTN, HLD  Significant Hospital Events: Including procedures, antibiotic start and stop dates in addition to other pertinent events   7/24 presented to [facility], ventral medulla CVA 7/25 tx to [facility] 7/26 cerebral  angiogram with stent placement to right vertebro-basilar junction, failed extubation, left radial aline MRI brain> mild extension of stroke, now involving bilateral medial medullary, patent basilar artery and R VBJ stent  7/28 underwent bedside percutaneous tracheostomy and PEG tube placement, tolerated well 7/29 no acute issues overnight currently on SBT trial this a.m. and tolerating well, Cleviprex resumed earlier this a.m. due to severe hypertension 7/30 Hypertension persist despite adding oral agents, glucose remains elevated as well 7/31 Bleeding around trach site, bright red blood. Copious bloody secretions. Cleviprex off, SBPs 150s-160s. Basal insulin/TF coverage increased. Cefepime deescalated to ceftriaxone. CXR stable. Later in afternoon, continued peritrach bleeding. Bronchoscopic eval completed with intratracheal bleeding associated with trach. Removed, patient orally reintubated, stoma packed. 8/1 ENT consulted for trach revision. Lightly sedated. Vent full support/PRVC, given fatigue following events of yesterday. ENT attempted to evaluate trach at bedside, could not pass scope. Plan for OR 8/2. ASA/Brilinta held. 8/2 to OR for trach redo   Interim History / Subjective:  No further bleeding from tracheostomy. Remains off sedation but resumed mechanical ventilation last night for tachypnea.  Objective:  Blood pressure (!) 144/74, pulse 88, temperature (!) 100.9 F (38.3 C), temperature source Axillary, resp. rate (!) 21, height '5\' 7"'$  (1.702 m), weight 84.4 kg, SpO2 96 %.    Vent Mode: PRVC FiO2 (%):  [40 %] 40 % Set Rate:  [18 bmp] 18 bmp Vt Set:  [520 mL] 520 mL PEEP:  [5 cmH20] 5 cmH20 Plateau Pressure:  [15 cmH20-16 cmH20] 15 cmH20   Intake/Output Summary (Last 24 hours) at 08/13/2021 0911 Last data filed at 08/13/2021 0800 Gross per 24 hour  Intake 1139.01 ml  Output 2700 ml  Net -1560.99 ml  Filed Weights   08/04/21 0914 08/07/21 0500 08/08/21 1445  Weight: 83.9 kg  89.1 kg 84.4 kg   Physical Examination: General: Adult male, resting in bed, in NAD. Neuro: Sleepy but does open eyes to voice and follow basic commands.able to stick tongue out and mouth words but unable to move neck, no limb movement.  HEENT: [place]/AT. Sclerae anicteric. Trach in place. Cardiovascular: RRR, no M/R/G.  Lungs: Respirations even and unlabored.  CTA bilaterally, No W/R/R. Abdomen: BS x 4, soft, NT/ND.  Musculoskeletal: No gross deformities, no edema.  Skin: Intact, warm, no rashes.   Assessment & Plan:    Acute hypoxemic respiratory failure in the setting of stroke S/p percutaneous tracheostomy and PEG tube placement 7/28. Peritracheostomy bleeding resulting in trach removal 7/31 - reintubated from above with ETT and ultimately trach revision/redo by ENT in OR 8/2. Enterobacter pneumonia completed treatment.  - Continue to attempt daily open-ended trach collar trials but may now have significant respiratory muscle atrophy and weaning will be slow - Appropriate for LTAC placement. Family would like patient to go to [facility].    Brainstem stroke involving the ventral medulla with basilar artery stensois s/p stent placement to right vertebro-basilar junction  Severe intracranial atherosclerosis Ongoing stroke-associated quadriplegia - SBP goal < 140 - ASA/Brilinta, Statin  Best Practice (right click and "Reselect all SmartList Selections" daily)   Diet/type: tubefeeds  DVT prophylaxis: prophylactic heparin  GI prophylaxis: PPI Lines: N/A Foley:  N/A Code Status:  full code Last date of multidisciplinary goals of care discussion: Full code. Discussed with daughter at bedside 8/1AM.  Kipp Brood, MD [facility] ICU Physician [place]  Pager: [phone] Or Epic Secure Chat After hours: [phone].  08/13/2021, 9:20 AM      08/13/2021, 9:11 AM

## 2021-08-13 NOTE — Progress Notes (Signed)
Gauze under trach is sutured in place and very soiled with blood. Per RN, ENT made aware 8/3 overnight. RN and RT notified CCM MD of need to be changed. RT unable to remove due to fresh trach and pt's difficult airway status. Trach site continues to bleed.

## 2021-08-13 NOTE — TOC Progression Note (Signed)
Transition of Care [facility]) - Progression Note    Patient Details  Name: Joshua Donovan MRN: [id number] Date of Birth: 1955/03/13  [facility] CM/SW Contact  Oren Section Cleta Alberts, RN Phone Number: 08/13/2021, 12:00pm  Clinical Narrative:    [facility] has chosen [facility] [employer].  Admissions coordinator for facility will initiate insurance authorization today.    Expected Discharge Plan: Long Term Acute Care (LTAC) Barriers to Discharge: Continued Medical Work up  Expected Discharge Plan and Services Expected Discharge Plan: Long Term Acute Care (LTAC)   Discharge Planning Services: CM Consult Post Acute Care Choice: Long Term Acute Care (LTAC) Living arrangements for the past 2 months: Single Family Home                                       Social Determinants of Health (SDOH) Interventions    Readmission Risk Interventions     No data to display         Reinaldo Raddle, RN, BSN  Trauma/Neuro ICU Case Manager [phone]

## 2021-08-13 NOTE — Progress Notes (Signed)
[place] Progress Note Patient Name: Abdirahman Chittum DOB: 1955-10-16 MRN: [id number]   Date of Service  08/13/2021  HPI/Events of Note  Increased hyperglycemia with Jevity nutrition  eICU Interventions  Increased levemir from 16 to 20 U BID     Intervention Category Intermediate Interventions: Hyperglycemia - evaluation and treatment  Talmage Teaster Rodman Pickle 08/13/2021, 8:43 PM

## 2021-08-13 NOTE — Progress Notes (Addendum)
Physical Therapy Treatment Patient Details Name: Joshua Donovan MRN: [id number] DOB: [birth year] Today's Date: 08/13/2021   History of Present Illness Pt is a 66 y.o. male who presented 08/02/21 with R foot pain and R-sided weakness. Transferred to [facility] 7/25. 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 S/p stent placement to the R vertebrobasilar junction 7/26, failed extubation. Trach and PEG placed 7/28. S/p bronchoscopic evaluation, oral reintubation and tracheostomy removal with stoma packing 7/31 due to trach site bleeding. S/p trach revision 8/2. PMH: DM, GERD, TBI with prior ICH, HLD, HTN    PT Comments    Pt is now able to mouth or gaze at yes/no on a sheet of paper to communicate with therapists. Pt smiling and appearing to have a slight laugh on occasion during session. Limited session to rolling, PROM, and transitioning pt to bed in chair-like position due to pt having BM and needing pericare and due to pt's body having rhythmic tensing and relaxation bouts noted to coincide with breaths. Notified RN. Noted bil biceps tone with passive fingers extension and elbow extension. Will continue to follow acutely. Current recommendations remain appropriate.     Recommendations for follow up therapy are one component of a multi-disciplinary discharge planning process, led by the attending physician.  Recommendations may be updated based on patient status, additional functional criteria and insurance authorization.  Follow Up Recommendations  Skilled nursing-short term rehab (<3 hours/day) (vs LTACH) Can patient physically be transported  by private vehicle: No   Assistance Recommended at Discharge Frequent or constant Supervision/Assistance  Patient can return home with the following Assistance with cooking/housework;Two people to help with walking and/or transfers;Two people to help with bathing/dressing/bathroom;Assistance with feeding;Direct supervision/assist for medications management;Direct supervision/assist for financial management;Assist for transportation;Help with stairs or ramp for entrance   Equipment Recommendations  Other (comment) (TBD)    Recommendations for Other Services       Precautions / Restrictions Precautions Precautions: Fall;Other (comment) Precaution Comments: trach on vent, peg, SBP < 180, prevalon boots Restrictions Weight Bearing Restrictions: No     Mobility  Bed Mobility Overal bed mobility: Needs Assistance Bed Mobility: Rolling Rolling: Total assist, +2 for physical assistance, +2 for safety/equipment         General bed mobility comments: TAx2 to roll either direction for pericare and bed sheet change due to noted BM. Pt placed in semi-chair position end of session.    Transfers                   General transfer comment: unable to actively assist    Ambulation/Gait               General Gait Details: unable   Stairs             Wheelchair Mobility    Modified Rankin (Stroke Patients Only) Modified Rankin (Stroke Patients Only) Pre-Morbid Rankin Score: No symptoms Modified Rankin: Severe disability     Balance Overall balance assessment: Needs assistance     Sitting balance - Comments: Pt placed in semi-chair position in bed end of session       Standing balance comment: deferred due to pt flaccidity  Cognition Arousal/Alertness: Awake/alert Behavior During Therapy: WFL for tasks assessed/performed Overall Cognitive Status: Difficult to assess                                  General Comments: Pt following cues to communicate via mouthing yes/no or looking at yes/no ona  sheet of paper. Difficult to formally assess cognition due to trach        Exercises Other Exercises Other Exercises: Gentle ROM to cervical region, trunk with rolling, and upper and lower extremities supine    General Comments General comments (skin integrity, edema, etc.): VSS, trach on vent 40% FiO2 PEEP 5; noted pt's body would tense up with elbows flexing until pt would have rhythmic breathe and then body would release/relax and repeat steady rhythmic cycle, notified RN; noted tone in biceps when passively moving elbows into extension and when passively moving fingers into extension      Pertinent Vitals/Pain Pain Assessment Pain Assessment: Faces Faces Pain Scale: Hurts a little bit Pain Location: did not indicate Pain Descriptors / Indicators: Grimacing Pain Intervention(s): Monitored during session, Limited activity within patient's tolerance, Repositioned    Home Living                          Prior Function            PT Goals (current goals can now be found in the care plan section) Acute Rehab PT Goals Patient Stated Goal: did not state PT Goal Formulation: Patient unable to participate in goal setting Time For Goal Achievement: 08/19/21 Potential to Achieve Goals: Fair Progress towards PT goals: Progressing toward goals    Frequency    Min 3X/week      PT Plan Current plan remains appropriate    Co-evaluation PT/OT/SLP Co-Evaluation/Treatment: Yes Reason for Co-Treatment: Complexity of the patient's impairments (multi-system involvement);For patient/therapist safety;To address functional/ADL transfers PT goals addressed during session: Mobility/safety with mobility;Strengthening/ROM        AM-PAC PT "6 Clicks" Mobility   Outcome Measure  Help needed turning from your back to your side while in a flat bed without using bedrails?: Total Help  needed moving from lying on your back to sitting on the side of a flat bed without using bedrails?: Total Help needed moving to and from a bed to a chair (including a wheelchair)?: Total Help needed standing up from a chair using your arms (e.g., wheelchair or bedside chair)?: Total Help needed to walk in hospital room?: Total Help needed climbing 3-5 steps with a railing? : Total 6 Click Score: 6    End of Session Equipment Utilized During Treatment: Oxygen (trach on vent) Activity Tolerance: Patient tolerated treatment well Patient left: in bed;with call bell/phone within reach;with SCD's reapplied Nurse Communication: Mobility status;Other (comment) (rhythmic tensing and relaxation of body) PT Visit Diagnosis: Muscle weakness (generalized) (M62.81);Difficulty in walking, not elsewhere classified (R26.2);Other symptoms and signs involving the nervous system (R29.898);Unsteadiness on feet (R26.81)     Time: 3825-0539 PT Time Calculation (min) (ACUTE ONLY): 35 min  Charges:  $Therapeutic Activity: 8-22 mins                     Moishe Spice, PT, DPT Acute Rehabilitation Services  Office: [phone]    Orvan Falconer 08/13/2021, 12:46 PM

## 2021-08-13 NOTE — Progress Notes (Signed)
[facility] ADULT ICU REPLACEMENT PROTOCOL   The patient does apply for the [facility] Adult ICU Electrolyte Replacment Protocol based on the criteria listed below:   1.Exclusion criteria: TCTS patients, ECMO patients, and Dialysis patients 2. Is GFR >/= 30 ml/min? Yes.    Patient's GFR today is >60 3. Is SCr </= 2? Yes.   Patient's SCr is 0.72 mg/dL 4. Did SCr increase >/= 0.5 in 24 hours? No. 5.Pt's weight >40kg  Yes.   6. Abnormal electrolyte(s):   K 3.4, Mg 1.8  7. Electrolytes replaced per protocol 8.  Call MD STAT for K+ </= 2.5, Phos </= 1, or Mag </= 1 Physician:  S. Rosie Fate R Iyauna Sing 08/13/2021 4:55 AM

## 2021-08-14 LAB — GLUCOSE, CAPILLARY
Glucose-Capillary: 204 mg/dL — ABNORMAL HIGH (ref 70–99)
Glucose-Capillary: 307 mg/dL — ABNORMAL HIGH (ref 70–99)
Glucose-Capillary: 304 mg/dL — ABNORMAL HIGH (ref 70–99)
Glucose-Capillary: 289 mg/dL — ABNORMAL HIGH (ref 70–99)
Glucose-Capillary: 334 mg/dL — ABNORMAL HIGH (ref 70–99)

## 2021-08-14 LAB — BASIC METABOLIC PANEL
Anion gap: 8 (ref 5–15)
BUN: 16 mg/dL (ref 8–23)
CO2: 28 mmol/L (ref 22–32)
Calcium: 8.8 mg/dL — ABNORMAL LOW (ref 8.9–10.3)
Chloride: 104 mmol/L (ref 98–111)
Creatinine, Ser: 0.78 mg/dL (ref 0.61–1.24)
GFR, Estimated: >60 mL/min (ref >60)
Glucose, Bld: 229 mg/dL — ABNORMAL HIGH (ref 70–99)
Potassium: 3.7 mmol/L (ref 3.5–5.1)
Sodium: 140 mmol/L (ref 135–145)

## 2021-08-14 LAB — MAGNESIUM: Magnesium: 2.1 mg/dL (ref 1.7–2.4)

## 2021-08-14 MED ORDER — GUAIFENESIN-CODEINE 100-10 MG/5ML PO SOLN
5.0000 mL | ORAL | Status: DC | PRN
  Administered 2021-08-14 – 2021-08-26 (×16): 5 mL
  Filled 2021-08-14 (×18): qty 5

## 2021-08-14 MED ORDER — LORATADINE 10 MG PO TABS
10.0000 mg | ORAL_TABLET | Freq: Every day | ORAL | Status: DC
  Administered 2021-08-14 – 2021-08-26 (×13): 10 mg
  Filled 2021-08-14 (×13): qty 1

## 2021-08-14 MED ORDER — CARVEDILOL 12.5 MG PO TABS
12.5000 mg | ORAL_TABLET | Freq: Two times a day (BID) | ORAL | Status: DC
  Administered 2021-08-14 – 2021-08-17 (×6): 12.5 mg
  Filled 2021-08-14 (×6): qty 1

## 2021-08-14 NOTE — Progress Notes (Signed)
CCM MD made aware of need of gauze to be changed under trach as it is sutured in and RT unable to change it.

## 2021-08-14 NOTE — Progress Notes (Signed)
Pt has hiccups. Unable to obtains some vent values. Pt also has a total RR of 24 however his spontaneous efforts are undetected by vent to trigger full support.

## 2021-08-14 NOTE — Progress Notes (Signed)
NAME:  Joshua Donovan, MRN:  [id number], DOB:  12/04/1955, LOS: 27 ADMISSION DATE:  08/03/2021, CONSULTATION DATE:  08/04/21 REFERRING MD:  Dr. Gerhard Perches, CHIEF COMPLAINT:  CVA   History of Present Illness:  66 year old man who presented to [facility] 7/24 with right hand numbness, right LE pain and weakness.  PMHx significant for HTN, TBI with prior ICH, DM, and HLD.  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 Patient's weakness worsened to include right arm, right leg and left leg.  CTA Head/Neck revealed multiple severe intracranial stenoses, near-occlusive stenosis of the vertebrobasilar junction, occlusion of the distal left V4, moderate mid basilar stenosis, nearly occlusive bilateral PCA P2 segments, nearly occlusive distal right ACA A2 segments and bulky soft plaque or thrombus in the supraclinoid left ICA with mild to moderate right M1 stenosis.    Subsequently transferred to [facility] for further stroke care/Neuro IR evaluation 7/25.  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  Additionally, occlusion of left V4 distal to origin of PICA and patent bilateral P-comm arteries noted.  Heparin gtt and Plavix initiated   Pertinent Medical History:  TBI with ICH, DM, HTN, HLD  Significant Hospital Events: Including procedures, antibiotic start and stop dates in addition to other pertinent events   7/24 presented to [facility], ventral medulla CVA 7/25 tx to [facility] 7/26 cerebral angiogram with stent placement to right vertebro-basilar junction, failed extubation, left radial aline MRI brain> mild extension of stroke, now involving bilateral medial medullary, patent basilar artery and R VBJ stent  7/28 underwent bedside percutaneous tracheostomy and PEG tube placement, tolerated well 7/29 no acute issues overnight currently on SBT trial this a.m. and tolerating  well, Cleviprex resumed earlier this a.m. due to severe hypertension 7/30 Hypertension persist despite adding oral agents, glucose remains elevated as well 7/31 Bleeding around trach site, bright red blood. Copious bloody secretions. Cleviprex off, SBPs 150s-160s. Basal insulin/TF coverage increased. Cefepime deescalated to ceftriaxone. CXR stable. Later in afternoon, continued peritrach bleeding. Bronchoscopic eval completed with intratracheal bleeding associated with trach. Removed, patient orally reintubated, stoma packed. 8/1 ENT consulted for trach revision. Lightly sedated. Vent full support/PRVC, given fatigue following events of yesterday. ENT attempted to evaluate trach at bedside, could not pass scope. Plan for OR 8/2. ASA/Brilinta held. 8/2 to OR for trach redo   Interim History / Subjective:  No further bleeding from tracheostomy. Remains off sedation but resumed mechanical ventilation last night for tachypnea.  Objective:  Blood pressure (!) 146/84, pulse 100, temperature 100.2 F (37.9 C), temperature source Axillary, resp. rate (!) 28, height '5\' 7"'$  (1.702 m), weight 84.4 kg, SpO2 92 %.    Vent Mode: PRVC FiO2 (%):  [30 %-40 %] 30 % Set Rate:  [18 bmp] 18 bmp Vt Set:  [520 mL] 520 mL PEEP:  [5 cmH20] 5 cmH20 Pressure Support:  [5 FKC12-75 cmH20] 5 cmH20 Plateau Pressure:  [12 cmH20] 12 cmH20   Intake/Output Summary (Last 24 hours) at 08/14/2021 0923 Last data filed at 08/14/2021 0900 Gross per 24 hour  Intake 3180.53 ml  Output 2550 ml  Net 630.53 ml    Filed Weights   08/04/21 0914 08/07/21 0500 08/08/21 1445  Weight: 83.9 kg 89.1 kg 84.4 kg   Physical Examination: General: Adult male, resting in bed, in NAD. Neuro: Sleepy but does open eyes to voice and follow basic commands.able to stick tongue out  and mouth words but unable to move neck, no limb movement.  HEENT: [place]/AT. Sclerae anicteric. Trach in place. Cardiovascular: RRR, no M/R/G.  Lungs: Respirations even and  unlabored.  CTA bilaterally, No W/R/R. Abdomen: BS x 4, soft, NT/ND.  Musculoskeletal: No gross deformities, no edema.  Skin: Intact, warm, no rashes.   Assessment & Plan:    Acute hypoxemic respiratory failure in the setting of stroke S/p percutaneous tracheostomy and PEG tube placement 7/28. Peritracheostomy bleeding resulting in trach removal 7/31 - reintubated from above with ETT and ultimately trach revision/redo by ENT in OR 8/2. Enterobacter pneumonia completed treatment.  - Continue to attempt daily open-ended trach collar trials but may now have significant respiratory muscle atrophy and weaning will be slow - Appropriate for LTAC placement. Family would like patient to go to [facility].   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  - ASA/Brilinta, Statin  Best Practice (right click and "Reselect all SmartList Selections" daily)   Diet/type: tubefeeds  DVT prophylaxis: prophylactic heparin  GI prophylaxis: PPI Lines: N/A Foley:  N/A Code Status:  full code Last date of multidisciplinary goals of care discussion: Full code. Discussed with daughter at bedside 8/1AM.  Kipp Brood, MD [facility] ICU Physician [place]  Pager: [phone] Or Epic Secure Chat After hours: [phone].  08/14/2021, 9:23 AM

## 2021-08-15 LAB — GLUCOSE, CAPILLARY
Glucose-Capillary: 234 mg/dL — ABNORMAL HIGH (ref 70–99)
Glucose-Capillary: 284 mg/dL — ABNORMAL HIGH (ref 70–99)
Glucose-Capillary: 267 mg/dL — ABNORMAL HIGH (ref 70–99)
Glucose-Capillary: 285 mg/dL — ABNORMAL HIGH (ref 70–99)

## 2021-08-15 MED ORDER — INSULIN ASPART 100 UNIT/ML IJ SOLN
10.0000 [IU] | Freq: Three times a day (TID) | INTRAMUSCULAR | Status: DC
  Administered 2021-08-15 – 2021-08-18 (×9): 10 [IU] via SUBCUTANEOUS

## 2021-08-15 MED ORDER — INSULIN DETEMIR 100 UNIT/ML ~~LOC~~ SOLN
30.0000 [IU] | Freq: Two times a day (BID) | SUBCUTANEOUS | Status: DC
Start: 2021-08-15 — End: 2021-08-16
  Administered 2021-08-15 – 2021-08-16 (×3): 30 [IU] via SUBCUTANEOUS
  Filled 2021-08-15 (×5): qty 0.3

## 2021-08-15 MED ORDER — NUTRISOURCE FIBER PO PACK
1.0000 | PACK | Freq: Two times a day (BID) | ORAL | Status: DC
  Administered 2021-08-15 – 2021-08-31 (×33): 1
  Filled 2021-08-15 (×33): qty 1

## 2021-08-15 NOTE — Progress Notes (Signed)
NAME:  Joshua Donovan, MRN:  [id number], DOB:  November 01, 1955, LOS: 69 ADMISSION DATE:  08/03/2021, CONSULTATION DATE:  08/04/21 REFERRING MD:  Dr. Gerhard Perches, CHIEF COMPLAINT:  CVA   History of Present Illness:  66 year old man who presented to [facility] 7/24 with right hand numbness, right LE pain and weakness.  PMHx significant for HTN, TBI with prior ICH, DM, and HLD.  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 Patient's weakness worsened to include right arm, right leg and left leg.  CTA Head/Neck revealed multiple severe intracranial stenoses, near-occlusive stenosis of the vertebrobasilar junction, occlusion of the distal left V4, moderate mid basilar stenosis, nearly occlusive bilateral PCA P2 segments, nearly occlusive distal right ACA A2 segments and bulky soft plaque or thrombus in the supraclinoid left ICA with mild to moderate right M1 stenosis.    Subsequently transferred to [facility] for further stroke care/Neuro IR evaluation 7/25.  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  Additionally, occlusion of left V4 distal to origin of PICA and patent bilateral P-comm arteries noted.  Heparin gtt and Plavix initiated   Pertinent Medical History:  TBI with ICH, DM, HTN, HLD  Significant Hospital Events: Including procedures, antibiotic start and stop dates in addition to other pertinent events   7/24 presented to [facility], ventral medulla CVA 7/25 tx to [facility] 7/26 cerebral angiogram with stent placement to right vertebro-basilar junction, failed extubation, left radial aline MRI brain> mild extension of stroke, now involving bilateral medial medullary, patent basilar artery and R VBJ stent  7/28 underwent bedside percutaneous tracheostomy and PEG tube placement, tolerated well 7/29 no acute issues overnight currently on SBT trial this a.m. and tolerating  well, Cleviprex resumed earlier this a.m. due to severe hypertension 7/30 Hypertension persist despite adding oral agents, glucose remains elevated as well 7/31 Bleeding around trach site, bright red blood. Copious bloody secretions. Cleviprex off, SBPs 150s-160s. Basal insulin/TF coverage increased. Cefepime deescalated to ceftriaxone. CXR stable. Later in afternoon, continued peritrach bleeding. Bronchoscopic eval completed with intratracheal bleeding associated with trach. Removed, patient orally reintubated, stoma packed. 8/1 ENT consulted for trach revision. Lightly sedated. Vent full support/PRVC, given fatigue following events of yesterday. ENT attempted to evaluate trach at bedside, could not pass scope. Plan for OR 8/2. ASA/Brilinta held. 8/2 to OR for trach redo   Interim History / Subjective:  Single episode of emesis vs regurgitation with coughing.  Still having loose stools and hyperglycemia. Tolerated trach collar for  12h yesterday .   Objective:  Blood pressure (!) 162/87, pulse 95, temperature 99.9 F (37.7 C), temperature source Oral, resp. rate (!) 34, height '5\' 7"'$  (1.702 m), weight 85.3 kg, SpO2 95 %.    Vent Mode: PRVC FiO2 (%):  [30 %-40 %] 40 % Set Rate:  [8 bmp-18 bmp] 18 bmp Vt Set:  [520 mL] 520 mL PEEP:  [5 cmH20] 5 cmH20 Plateau Pressure:  [12 cmH20] 12 cmH20   Intake/Output Summary (Last 24 hours) at 08/15/2021 0815 Last data filed at 08/15/2021 0600 Gross per 24 hour  Intake 3624.02 ml  Output 3050 ml  Net 574.02 ml    Filed Weights   08/07/21 0500 08/08/21 1445 08/15/21 0403  Weight: 89.1 kg 84.4 kg 85.3 kg   Physical Examination: General: Adult male, resting in bed, in NAD. Neuro: Sleepy but does open eyes to voice and follow basic commands.able to stick tongue  out and mouth words but unable to move neck, no limb movement.  HEENT: /AT. Sclerae anicteric. Trach in place. Cardiovascular: RRR, no M/R/G.  Lungs: Respirations even and unlabored.  CTA  bilaterally, No W/R/R. Abdomen: BS x 4, soft, NT/ND.  Musculoskeletal: No gross deformities, no edema.  Skin: Intact, warm, no rashes.   Assessment & Plan:    Acute hypoxemic respiratory failure in the setting of stroke S/p percutaneous tracheostomy and PEG tube placement 7/28. Peritracheostomy bleeding resulting in trach removal 7/31 - reintubated from above with ETT and ultimately trach revision/redo by ENT in OR 8/2. Enterobacter pneumonia completed treatment.  - Continue to attempt daily open-ended trach collar trials but may now have significant respiratory muscle atrophy and weaning will be slow.  - Try overnight TCT if able to go 3 days on daytime TCT.  - Appropriate for LTAC placement. Family would like patient to go to [facility].   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  - ASA/Brilinta, Statin  Type 2 diabetes with stress hyperglycemia  - Increase detemir insulin and mealtime coverage.    Best Practice (right click and "Reselect all SmartList Selections" daily)   Diet/type: tubefeeds  DVT prophylaxis: prophylactic heparin  GI prophylaxis: PPI Lines: N/A Foley:  N/A Code Status:  full code Last date of multidisciplinary goals of care discussion: Full code. Discussed with daughter at bedside 8/1AM.  Kipp Brood, MD [facility] ICU Physician [place]  Pager: [phone] Or Epic Secure Chat After hours: [phone].  08/15/2021, 8:15 AM

## 2021-08-16 ENCOUNTER — Encounter (HOSPITAL_COMMUNITY): Payer: Self-pay | Admitting: Otolaryngology

## 2021-08-16 LAB — GLUCOSE, CAPILLARY
Glucose-Capillary: 188 mg/dL — ABNORMAL HIGH (ref 70–99)
Glucose-Capillary: 262 mg/dL — ABNORMAL HIGH (ref 70–99)
Glucose-Capillary: 201 mg/dL — ABNORMAL HIGH (ref 70–99)
Glucose-Capillary: 200 mg/dL — ABNORMAL HIGH (ref 70–99)

## 2021-08-16 MED ORDER — INSULIN DETEMIR 100 UNIT/ML ~~LOC~~ SOLN
45.0000 [IU] | Freq: Two times a day (BID) | SUBCUTANEOUS | Status: DC
Start: 2021-08-16 — End: 2021-08-19
  Administered 2021-08-16 – 2021-08-18 (×5): 45 [IU] via SUBCUTANEOUS
  Filled 2021-08-16 (×7): qty 0.45

## 2021-08-16 NOTE — Evaluation (Signed)
[facility]y-Muir Speaking Valve - Evaluation Patient Details  Name: Joshua Donovan MRN: [id number] Date of Birth: [birth year]  Today's Date: 08/16/2021 Time: 1015-1045 SLP Time Calculation (min) (ACUTE ONLY): 30 min  Past Medical History:  Past Medical History:  Diagnosis Date   Diabetes mellitus without complication ([place])    GERD (gastroesophageal reflux disease)    Hemorrhage in the brain ([place]) 08/2015   High cholesterol    Hyperlipidemia    Hypertension    Hypertensive emergency 08/03/2021   Past Surgical History:  Past Surgical History:  Procedure Laterality Date   COLONOSCOPY WITH PROPOFOL N/A 09/08/2017   Procedure: COLONOSCOPY WITH PROPOFOL;  Surgeon: Lucilla Lame, MD;  Location: [place];  Service: Endoscopy;  Laterality: N/A;  Diabetic - oral meds   IR ANGIO INTRA EXTRACRAN SEL COM CAROTID INNOMINATE BILAT MOD SED  08/04/2021   IR ANGIO VERTEBRAL SEL SUBCLAVIAN INNOMINATE UNI L MOD SED  08/04/2021   IR ANGIO VERTEBRAL SEL VERTEBRAL UNI R MOD SED  08/04/2021   IR CT HEAD LTD  08/04/2021   IR INTRA CRAN STENT  08/04/2021   IR US GUIDE VASC ACCESS RIGHT  08/04/2021   partial intestine removed     approx date: 1980   POLYPECTOMY  09/08/2017   Procedure: POLYPECTOMY;  Surgeon: Lucilla Lame, MD;  Location: [place];  Service: Endoscopy;;   RADIOLOGY WITH ANESTHESIA N/A 08/04/2021   Procedure: Angioplasty/stenting of vertebrobasilar stenosis;  Surgeon: Luanne Bras, MD;  Location: [place];  Service: Radiology;  Laterality: N/A;   HPI:  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 He  presented 08/02/21 with R foot pain and R-sided weakness. Transferred to [facility] 7/25. MRI 7/26 revealed interval expansion of previously identified ventral medullary infarct, extending posteriorly to traverse the medulla to the floor of the fourth ventricle, new scattered  small volume ischemic infarcts involving the right cerebellum as well as the cortical aspects of the right greater than  left occipital lobes, and single punctate focus of associated petechial hemorrhage at the right cerebellum. S/p stent placement to the R vertebrobasilar junction 7/26, failed extubation. Trach and PEG placed 7/28. PMH: DM, GERD, TBI with prior ICH, HLD, HTN    Assessment / Plan / Recommendation  Clinical Impression  Pt assessed with PMSV placement; at baseline pt on trach collar with cuff deflated prior to SLP arrival. RN reported excessive oral secretions this am, but minimal tracheal secretions. Pt consented to attempting valve placement. SLP placed valve and cued throat clearing with oral suction provided. Pt able to mobilize moderate upper airway secretions, achieve hoarse phonation, and blow air through pursed lips. PMSV repeatedly placed and removed for 30 second intervals as pt did have increase pressure behind valve. Suspect only partial redirection of air. Pt to use PMSV with SLP only.  Discussed promising potential for new methods to communicate. Will continue efforts. SLP Visit Diagnosis: Dysarthria and anarthria (R47.1)    SLP Assessment  Patient needs continued Speech Lanaguage Pathology Services    Recommendations for follow up therapy are one component of a multi-disciplinary discharge planning process, led by the attending physician.  Recommendations may be updated based on patient status, additional functional criteria and insurance authorization.  Follow Up Recommendations       Assistance Recommended at Discharge    Functional Status Assessment    Frequency and Duration         PMSV Trial PMSV was placed for: 30 second interval max Able to redirect subglottic air  through upper airway: Yes Able to Attain Phonation: Yes Voice Quality: Hoarse;Low vocal intensity Level of Secretion Expectoration with PMSV: Tracheal Breath Support for Phonation: Moderately decreased Respirations During Trial: 26 SpO2 During Trial: 96 % Pulse During Trial: 102   Tracheostomy Tube   Additional Tracheostomy Tube Assessment Trach Collar Period: trying 24 hours today Secretion Description: thin bloody trach secretions    Vent Dependency  FiO2 (%): 35 %    Cuff Deflation Trial Tolerated Cuff Deflation: Yes Length of Time for Cuff Deflation Trial:  (baseline)         [name], [name] 08/16/2021, 12:53 PM

## 2021-08-16 NOTE — Progress Notes (Signed)
Speech Language Pathology Treatment: Cognitive-Linquistic  Patient Details Name: Joshua Donovan MRN: [id number] DOB: Dec 06, 1955 Today's Date: 08/16/2021 Time: 8101-7510 SLP Time Calculation (min) (ACUTE ONLY): 30 min  Assessment / Plan / Recommendation Clinical Impression  Pt more confident with Y/N communication today. Pt utilizing eye gaze and mouthing yes and no. SLP screened pt for auditory scanning with letters to spell a cued word "cat" which pt was able to do after a model. He is attempting to mouth words and phrases, which are unintelligible without a choice or known target. A list of common needs for auditory scanning to make choices would be helpful - will work on this in future sessions.  SLP is in communication with [name] to secure a loaned eye gaze device with a floor mount. Included [name] SLP in communication in case pt d/c's prior to receiving the device.    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 He  presented 08/02/21 with R foot pain and R-sided weakness. Transferred to [facility] 7/25. 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 S/p stent placement to the R vertebrobasilar junction 7/26, failed extubation. Trach and PEG placed 7/28. PMH: DM, GERD, TBI with prior ICH, HLD, HTN      SLP Plan  Continue with current plan of care      Recommendations for follow up therapy are one component of a multi-disciplinary discharge planning process, led by the attending physician.  Recommendations may be updated based on patient status, additional functional criteria and  insurance authorization.    Recommendations                   Plan: Continue with current plan of care           [name], [name]  08/16/2021, 12:30 PM

## 2021-08-16 NOTE — Progress Notes (Signed)
Physical Therapy Treatment Patient Details Name: Joshua Donovan MRN: [id number] DOB: November 18, 1955 Today's Date: 08/16/2021   History of Present Illness Pt is a 66 y.o. male who presented 08/02/21 with R foot pain and R-sided weakness. Transferred to [facility] 7/25. 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 S/p stent placement to the R vertebrobasilar junction 7/26, failed extubation. Trach and PEG placed 7/28. S/p bronchoscopic evaluation, oral reintubation and tracheostomy removal with stoma packing 7/31 due to trach site bleeding. S/p trach revision 8/2. PMH: DM, GERD, TBI with prior ICH, HLD, HTN    PT Comments    The pt was agreeable to session, demos good use of mouthing yes/no or looking at yes/no on a sheet of paper through session. The pt continues to need totalA to complete rolling and transition to sitting EOB, as well as totalA through trunk and to control head/neck position. The pt has significantly increased secretions in sitting, but tolerated for ~10 min with no change in vitals. Will continue to benefit from skilled PT to progress activity tolerance, will work towards OOB transfer to [facility] (will be dependent on lift).    Recommendations for follow up therapy are one component of a multi-disciplinary discharge planning process, led by the attending physician.  Recommendations may be updated based on patient status, additional functional criteria and insurance authorization.  Follow Up Recommendations  Skilled nursing-short term rehab (<3 hours/day) (vs LTACH) Can patient physically be transported by private vehicle: No    Assistance Recommended at Discharge Frequent or constant Supervision/Assistance  Patient can return home with the following Assistance with cooking/housework;Two people to help with walking and/or transfers;Two people to help with bathing/dressing/bathroom;Assistance with feeding;Direct supervision/assist for medications management;Direct supervision/assist for financial management;Assist for transportation;Help with stairs or ramp for entrance   Equipment Recommendations  Other (comment) (TBD (likely tilt-in-space WC with head and trunk support))    Recommendations for Other Services       Precautions / Restrictions Precautions Precautions: Fall;Other (comment) Precaution Comments: trach collar this session, peg, SBP < 180, prevalon boots Restrictions Weight Bearing Restrictions: No     Mobility  Bed Mobility Overal bed mobility: Needs Assistance Bed Mobility: Rolling, Sidelying to Sit, Sit to Supine Rolling: Total assist, +2 for physical assistance, +2 for safety/equipment Sidelying to sit: Total assist, +2 for physical assistance   Sit to supine: Total assist, +2 for physical assistance   General bed mobility comments: totalA of 2 with head control    Transfers                   General transfer comment: unable to actively assist    Ambulation/Gait               General Gait Details: unable      Modified Rankin (Stroke Patients Only) Modified Rankin (Stroke Patients Only) Pre-Morbid Rankin Score: No symptoms Modified Rankin: Severe disability     Balance Overall balance assessment: Needs assistance     Sitting balance - Comments: Pt placed in semi-chair position in bed end of session       Standing balance comment: deferred due to pt flaccidity  Cognition Arousal/Alertness: Awake/alert Behavior During Therapy: WFL for tasks assessed/performed Overall Cognitive Status: Difficult to assess                                  General Comments: Pt following cues to communicate via mouthing yes/no or looking at yes/no on asheet of paper. able to answer orientation questions appropriately, limited to yes/no questions. following commands with movement he has available (eyes and facial expressions)        Exercises Other Exercises Other Exercises: seated trunk rotations x5 each direction    General Comments General comments (skin integrity, edema, etc.): VSS with SBP <180      Pertinent Vitals/Pain Pain Assessment Pain Assessment: Faces Faces Pain Scale: Hurts little more Pain Location: grimacing with coughing Pain Descriptors / Indicators: Grimacing Pain Intervention(s): Monitored during session     PT Goals (current goals can now be found in the care plan section) Acute Rehab PT Goals Patient Stated Goal: did not state PT Goal Formulation: Patient unable to participate in goal setting Time For Goal Achievement: 08/19/21 Potential to Achieve Goals: Fair Progress towards PT goals: Progressing toward goals    Frequency    Min 3X/week      PT Plan Current plan remains appropriate    Co-evaluation PT/OT/SLP Co-Evaluation/Treatment: Yes Reason for Co-Treatment: Complexity of the patient's impairments (multi-system involvement);Necessary to address cognition/behavior during functional activity;For patient/therapist safety;To address functional/ADL transfers PT goals addressed during session: Mobility/safety with mobility;Balance;Strengthening/ROM        AM-PAC PT "6 Clicks" Mobility   Outcome Measure  Help needed turning from your back to your side while in a flat bed without using bedrails?: Total Help needed moving from lying on your back to sitting on the side of a flat bed without using bedrails?: Total Help needed moving to and from a bed to a chair (including a wheelchair)?: Total Help needed standing up from a chair using your arms (e.g., wheelchair or  bedside chair)?: Total Help needed to walk in hospital room?: Total Help needed climbing 3-5 steps with a railing? : Total 6 Click Score: 6    End of Session Equipment Utilized During Treatment: Oxygen (trach collar) Activity Tolerance: Patient tolerated treatment well Patient left: in bed;with call bell/phone within reach;with SCD's reapplied Nurse Communication: Mobility status PT Visit Diagnosis: Muscle weakness (generalized) (M62.81);Difficulty in walking, not elsewhere classified (R26.2);Other symptoms and signs involving the nervous system (R29.898);Unsteadiness on feet (R26.81)     Time: 2993-7169 PT Time Calculation (min) (ACUTE ONLY): 36 min  Charges:  $Therapeutic Activity: 8-22 mins                     [name], PT, DPT   Acute Rehabilitation Department   [name] 08/16/2021, 2:14 PM

## 2021-08-16 NOTE — Plan of Care (Signed)
Pt alert.  Communicating by mouthing words and using eyes.  Unable to move any extremity purposefully.  Tolerating trach collar today and able to cough to expectorate secretions.  NSR.  BP controlled with scheduled meds.  Tolerating bolus tf.  Pt has liquid stool with laxitives held today.  Blood glucose controlled better with SSI, scheduled insulin, and Long acting insulin.  Voiding adequately via external cath.  Skin intact.  No s/s of any infection.

## 2021-08-16 NOTE — Progress Notes (Signed)
NAME:  Jaelyn Cloninger, MRN:  [id number], DOB:  Oct 20, 1955, LOS: 75 ADMISSION DATE:  08/03/2021, CONSULTATION DATE:  7/26 REFERRING MD:  Colvin Caroli FOR CONSULT:  CVA   History of Present Illness:  66 y/o male presented to [facility] on 7/24 with R hand numbness and weakness, found to have small brainstem strok in ventral medulla.  Symptoms worsened and required transfer to [facility] for neuro IR intervention where he had a stent placed in R vertebrovasilar junction.  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  He had a tracheostomy performed on 7/26.    Pertinent  Medical History  TBI with ICH, DM, HTN, HLD  Significant Hospital Events: Including procedures, antibiotic start and stop dates in addition to other pertinent events   7/24 presented to [facility], ventral medulla CVA 7/25 tx to [facility] 7/26 cerebral angiogram with stent placement to right vertebro-basilar junction, failed extubation, left radial aline MRI brain> mild extension of stroke, now involving bilateral medial medullary, patent basilar artery and R VBJ stent  7/28 underwent bedside percutaneous tracheostomy and PEG tube placement, tolerated well 7/29 no acute issues overnight currently on SBT trial this a.m. and tolerating well, Cleviprex resumed earlier this a.m. due to severe hypertension 7/30 Hypertension persist despite adding oral agents, glucose remains elevated as well 7/31 Bleeding around trach site, bright red blood. Copious bloody secretions. Cleviprex off, SBPs 150s-160s. Basal insulin/TF coverage increased. Cefepime deescalated to ceftriaxone. CXR stable. Later in afternoon, continued peritrach bleeding. Bronchoscopic eval completed with intratracheal bleeding associated with trach. Removed, patient orally reintubated, stoma packed. 8/1 ENT consulted for trach revision. Lightly sedated. Vent full support/PRVC, given fatigue following events of yesterday. ENT attempted to evaluate trach at bedside, could not pass  scope. Plan for OR 8/2. ASA/Brilinta held. 8/2 to OR for trach redo 8/6 trach collar during day shift, back on vent overnight  Interim History / Subjective:    trach collar during day shift, back on vent overnight  Objective   Blood pressure (!) 170/90, pulse 89, temperature 100.3 F (37.9 C), temperature source Axillary, resp. rate 13, height '5\' 7"'$  (1.702 m), weight 83.2 kg, SpO2 99 %.    Vent Mode: PRVC FiO2 (%):  [30 %-40 %] 30 % Set Rate:  [16 bmp] 16 bmp Vt Set:  [520 mL] 520 mL PEEP:  [5 cmH20] 5 cmH20 Plateau Pressure:  [13 cmH20] 13 cmH20   Intake/Output Summary (Last 24 hours) at 08/16/2021 0730 Last data filed at 08/16/2021 0604 Gross per 24 hour  Intake 2755.29 ml  Output 2400 ml  Net 355.29 ml   Filed Weights   08/15/21 0403 08/15/21 1000 08/16/21 0500  Weight: 85.3 kg 82.7 kg 83.2 kg    Examination:  General:  In bed on vent HENT: NCAT tracheostomy in place PULM: CTA B, vent supported breathing CV: RRR, no mgr GI: BS+, soft, nontender MSK: normal bulk and tone Neuro: awake on vent, blinks eyes to question  Resolved Hospital Problem list   Enterobacter HCAP  Assessment & Plan:  Acute hypoxemic respiratory failure in setting of stroke Tracheostomy status > bleeding from initial tracheostomy, s/p redo tracheostomy 8/2 by ENT Trach collar as long as tolerated VAP prevention Plan 24 hour off vent today Monitor tracheostomy site for bleeding  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 to R vertebro-basilar junction Hypertension> difficult to control ASA, brilinta Statin Goal SBP < 140 Coreg to continue amlodipine, coreg, hctz, clonidine  DM2 with  hyperglycemia Insulin dosing adjusted overnight Continue levemir 30 q12h SSI and tube feeding coverage  Dispo: LTAC this week  Best Practice (right click and "Reselect all SmartList Selections" daily)   Diet/type: tubefeeds DVT prophylaxis: prophylactic  heparin  GI prophylaxis: PPI Lines: N/A Foley:  N/A Code Status:  full code Last date of multidisciplinary goals of care discussion [8/1]  Critical care time: n/a     Roselie Awkward, MD [place] [facility] Pager: [phone] Cell: [phone] After 7:00 pm call Elink  [phone]

## 2021-08-16 NOTE — Progress Notes (Signed)
Occupational Therapy Treatment Patient Details Name: Joshua Donovan MRN: [id number] DOB: 01-24-1955 Today's Date: 08/16/2021   History of present illness Pt is a 66 y.o. male who presented 08/02/21 with R foot pain and R-sided weakness. Transferred to [facility] 7/25. 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 S/p stent placement to the R vertebrobasilar junction 7/26, failed extubation. Trach and PEG placed 7/28. S/p bronchoscopic evaluation, oral reintubation and tracheostomy removal with stoma packing 7/31 due to trach site bleeding. S/p trach revision 8/2. PMH: DM, GERD, TBI with prior ICH, HLD, HTN   OT comments  Joshua Donovan was seen with PT. He remains total A for all aspects of his care. He tolerated sitting EOB with dependent trunk and cervical PROM and communicating with yes/no mouthing while also using a yes/no visual gaze sign. Pt continues to benefit from acute OT and POC remains appropriate.  Inquired about tilt in space w/c with head, chest, and BUE supports/straps to allow Joshua Donovan to safety transfer OOB - pending response.    Recommendations for follow up therapy are one component of a multi-disciplinary discharge planning process, led by the attending physician.  Recommendations may be updated based on patient status, additional functional criteria and insurance authorization.    Follow Up Recommendations  OT at Long-term acute care hospital    Assistance Recommended at Discharge Frequent or constant Supervision/Assistance  Patient can return home with the following  Two people to help with walking and/or transfers;Two people to  help with bathing/dressing/bathroom;Assistance with cooking/housework;Assistance with feeding;Direct supervision/assist for medications management;Direct supervision/assist for financial management;Assist for transportation;Help with stairs or ramp for entrance   Equipment Recommendations  Other (comment)       Precautions / Restrictions Precautions Precautions: Fall;Other (comment) Precaution Comments: trach collar this session, peg, SBP < 180, prevalon boots Restrictions Weight Bearing Restrictions: No       Mobility Bed Mobility Overal bed mobility: Needs Assistance Bed Mobility: Rolling, Sidelying to Sit, Sit to Supine Rolling: Total assist, +2 for physical assistance, +2 for safety/equipment Sidelying to sit: Total assist, +2 for physical assistance   Sit to supine: Total assist, +2 for physical assistance   General bed mobility comments: totalA of 2 with head control    Transfers                   General transfer comment: unable to actively assist     Balance Overall balance assessment: Needs assistance Sitting-balance support: Single extremity supported, No upper extremity supported, Feet supported, Bilateral upper extremity supported Sitting balance-Leahy Scale: Zero                                     ADL either performed or assessed with clinical judgement   ADL Overall ADL's : Needs assistance/impaired     Grooming: Total assistance                                 General ADL Comments: total A for all aspects of his care    Extremity/Trunk Assessment Upper Extremity Assessment  Upper Extremity Assessment: RUE deficits/detail;LUE deficits/detail RUE Deficits / Details: No AROM noted.  PROM intact. RUE Coordination: decreased gross motor;decreased fine motor LUE Deficits / Details: No AROM noted.  PROM WFL LUE Coordination: decreased fine motor;decreased gross motor   Lower Extremity Assessment Lower Extremity  Assessment: Defer to PT evaluation        Vision   Vision Assessment?: Yes Eye Alignment: Within Functional Limits Ocular Range of Motion: Within Functional Limits Additional Comments: accurately locating yes/no sign for communication. constant nystagmus   Perception Perception Perception: Not tested   Praxis Praxis Praxis: Not tested    Cognition Arousal/Alertness: Awake/alert Behavior During Therapy: WFL for tasks assessed/performed Overall Cognitive Status: Difficult to assess                                 General Comments: Pt following cues to communicate via mouthing yes/no or looking at yes/no on asheet of paper. able to answer orientation questions appropriately, limited to yes/no questions. following commands with movement he has available (eyes and facial expressions)        Exercises Exercises: Other exercises Other Exercises Other Exercises: seated trunk rotations x5 each direction    Shoulder Instructions       General Comments VSS with SBP <180    Pertinent Vitals/ Pain       Pain Assessment Pain Assessment: No/denies pain Pain Intervention(s): Monitored during session  Home Living                                          Prior Functioning/Environment              Frequency  Min 2X/week        Progress Toward Goals  OT Goals(current goals can now be found in the care plan section)  Progress towards OT goals: Progressing toward goals  Acute Rehab OT Goals Patient Stated Goal: unable OT Goal Formulation: Patient unable to participate in goal setting Time For Goal Achievement: 08/19/21 Potential to Achieve Goals: Fair ADL Goals Pt/caregiver will Perform Home Exercise Program: Both right and left upper extremity;With written HEP provided Additional ADL Goal #1: Pt's family will be educated about bed positioning to avoid pressure sores and increase care of skin Additional ADL Goal #2: Pt will use soft  touch call bell to call nurses by moving head with min assist. Additional ADL Goal #3: Therapist to explore other options for call bell use if pt unable to move head to use soft touch system.  Plan Discharge plan remains appropriate    Co-evaluation    PT/OT/SLP Co-Evaluation/Treatment: Yes Reason for Co-Treatment: Complexity of the patient's impairments (multi-system involvement);For patient/therapist safety;To address functional/ADL transfers PT goals addressed during session: Mobility/safety with mobility;Balance;Strengthening/ROM OT goals addressed during session: ADL's and self-care      AM-PAC OT "6 Clicks" Daily Activity     Outcome Measure   Help from another person eating meals?: Total Help from another person taking care of personal grooming?: Total Help from another person toileting, which includes using toliet, bedpan, or urinal?: Total Help from another person bathing (including washing, rinsing, drying)?: Total Help from another person to put on and taking off regular upper body clothing?: Total Help from another person to put on and taking off regular lower body clothing?: Total 6 Click Score: 6  End of Session Equipment Utilized During Treatment: Oxygen  OT Visit Diagnosis: Other symptoms and signs involving the nervous system (R29.898)   Activity Tolerance Patient tolerated treatment well   Patient Left in bed;with bed alarm set   Nurse Communication Mobility status;Patient requests pain meds        Time: 7741-4239 OT Time Calculation (min): 36 min  Charges: OT General Charges $OT Visit: 1 Visit OT Treatments $Therapeutic Activity: 8-22 mins   Nuriyah Hanline A Ewelina Naves 08/16/2021, 4:42 PM

## 2021-08-17 LAB — CBC WITH DIFFERENTIAL/PLATELET
Abs Immature Granulocytes: 0.05 10*3/uL (ref 0.00–0.07)
Basophils Absolute: 0.1 10*3/uL (ref 0.0–0.1)
Basophils Relative: 1 %
Eosinophils Absolute: 0.2 10*3/uL (ref 0.0–0.5)
Eosinophils Relative: 2 %
HCT: 38.8 % — ABNORMAL LOW (ref 39.0–52.0)
Hemoglobin: 12.7 g/dL — ABNORMAL LOW (ref 13.0–17.0)
Immature Granulocytes: 0 %
Lymphocytes Relative: 11 %
Lymphs Abs: 1.3 10*3/uL (ref 0.7–4.0)
MCH: 29.1 pg (ref 26.0–34.0)
MCHC: 32.7 g/dL (ref 30.0–36.0)
MCV: 89 fL (ref 80.0–100.0)
Monocytes Absolute: 0.9 10*3/uL (ref 0.1–1.0)
Monocytes Relative: 8 %
Neutro Abs: 8.6 10*3/uL — ABNORMAL HIGH (ref 1.7–7.7)
Neutrophils Relative %: 78 %
Platelets: 429 10*3/uL — ABNORMAL HIGH (ref 150–400)
RBC: 4.36 MIL/uL (ref 4.22–5.81)
RDW: 13.2 % (ref 11.5–15.5)
WBC: 11.1 10*3/uL — ABNORMAL HIGH (ref 4.0–10.5)
nRBC: 0 % (ref 0.0–0.2)

## 2021-08-17 LAB — COMPREHENSIVE METABOLIC PANEL
ALT: 74 U/L — ABNORMAL HIGH (ref 0–44)
AST: 30 U/L (ref 15–41)
Albumin: 2.8 g/dL — ABNORMAL LOW (ref 3.5–5.0)
Alkaline Phosphatase: 37 U/L — ABNORMAL LOW (ref 38–126)
Anion gap: 9 (ref 5–15)
BUN: 16 mg/dL (ref 8–23)
CO2: 28 mmol/L (ref 22–32)
Calcium: 9.1 mg/dL (ref 8.9–10.3)
Chloride: 104 mmol/L (ref 98–111)
Creatinine, Ser: 0.73 mg/dL (ref 0.61–1.24)
GFR, Estimated: >60 mL/min (ref >60)
Glucose, Bld: 110 mg/dL — ABNORMAL HIGH (ref 70–99)
Potassium: 3.3 mmol/L — ABNORMAL LOW (ref 3.5–5.1)
Sodium: 141 mmol/L (ref 135–145)
Total Bilirubin: 0.5 mg/dL (ref 0.3–1.2)
Total Protein: 6.5 g/dL (ref 6.5–8.1)

## 2021-08-17 LAB — GLUCOSE, CAPILLARY
Glucose-Capillary: 123 mg/dL — ABNORMAL HIGH (ref 70–99)
Glucose-Capillary: 240 mg/dL — ABNORMAL HIGH (ref 70–99)
Glucose-Capillary: 194 mg/dL — ABNORMAL HIGH (ref 70–99)
Glucose-Capillary: 267 mg/dL — ABNORMAL HIGH (ref 70–99)

## 2021-08-17 LAB — MAGNESIUM: Magnesium: 1.9 mg/dL (ref 1.7–2.4)

## 2021-08-17 MED ORDER — MAGNESIUM SULFATE 2 GM/50ML IV SOLN
2.0000 g | Freq: Once | INTRAVENOUS | Status: AC
  Administered 2021-08-17: 2 g via INTRAVENOUS
  Filled 2021-08-17: qty 50

## 2021-08-17 MED ORDER — CARVEDILOL 25 MG PO TABS
25.0000 mg | ORAL_TABLET | Freq: Two times a day (BID) | ORAL | Status: DC
Start: 2021-08-17 — End: 2021-09-08
  Administered 2021-08-17 – 2021-09-08 (×44): 25 mg
  Filled 2021-08-17 (×11): qty 1
  Filled 2021-08-17: qty 2
  Filled 2021-08-17: qty 1
  Filled 2021-08-17: qty 2
  Filled 2021-08-17 (×30): qty 1

## 2021-08-17 MED ORDER — POTASSIUM CHLORIDE 20 MEQ PO PACK
20.0000 meq | PACK | ORAL | Status: AC
  Administered 2021-08-17 (×2): 20 meq
  Filled 2021-08-17: qty 1

## 2021-08-17 MED ORDER — POTASSIUM CHLORIDE 10 MEQ/100ML IV SOLN
10.0000 meq | INTRAVENOUS | Status: AC
  Administered 2021-08-17 (×4): 10 meq via INTRAVENOUS
  Filled 2021-08-17 (×4): qty 100

## 2021-08-17 MED ORDER — POTASSIUM CHLORIDE 20 MEQ PO PACK: 40.0000 meq | PACK | Freq: Once | ORAL | Status: DC

## 2021-08-17 MED ORDER — CARVEDILOL 12.5 MG PO TABS
12.5000 mg | ORAL_TABLET | Freq: Once | ORAL | Status: AC
  Administered 2021-08-17: 12.5 mg
  Filled 2021-08-17: qty 1

## 2021-08-17 NOTE — Progress Notes (Signed)
Per [facility], a P2P can be requested for patient by calling [phone] ext [phone]. MD updated.  Gilmore Laroche, MSW, [facility]

## 2021-08-17 NOTE — Progress Notes (Signed)
[facility] ADULT ICU REPLACEMENT PROTOCOL   The patient does apply for the [facility] Adult ICU Electrolyte Replacment Protocol based on the criteria listed below:   1.Exclusion criteria: TCTS patients, ECMO patients, and Dialysis patients 2. Is GFR >/= 30 ml/min? Yes.    Patient's GFR today is >60 3. Is SCr </= 2? Yes.   Patient's SCr is 0.73 mg/dL 4. Did SCr increase >/= 0.5 in 24 hours? No. 5.Pt's weight >40kg  Yes.   6. Abnormal electrolyte(s): K+ 3.3, mag 1.9  7. Electrolytes replaced per protocol 8.  Call MD STAT for K+ </= 2.5, Phos </= 1, or Mag </= 1 Physician:  n/a  Joshua Donovan 08/17/2021 4:36 AM

## 2021-08-17 NOTE — Progress Notes (Addendum)
NAME:  Michiah Mudry, MRN:  [id number], DOB:  July 13, 1955, LOS: 66 ADMISSION DATE:  08/03/2021, CONSULTATION DATE:  7/26 REFERRING MD:  Colvin Caroli FOR CONSULT:  CVA   History of Present Illness:  66 y/o male presented to [facility] on 7/24 with R hand numbness and weakness, found to have small brainstem strok in ventral medulla.  Symptoms worsened and required transfer to [facility] for neuro IR intervention where he had a stent placed in R vertebrovasilar junction.  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  He had a tracheostomy performed on 7/26.    Pertinent  Medical History  TBI with ICH, DM, HTN, HLD  Significant Hospital Events: Including procedures, antibiotic start and stop dates in addition to other pertinent events   7/24 presented to [facility], ventral medulla CVA 7/25 tx to [facility] 7/26 cerebral angiogram with stent placement to right vertebro-basilar junction, failed extubation, left radial aline MRI brain> mild extension of stroke, now involving bilateral medial medullary, patent basilar artery and R VBJ stent  7/28 underwent bedside percutaneous tracheostomy and PEG tube placement, tolerated well 7/29 no acute issues overnight currently on SBT trial this a.m. and tolerating well, Cleviprex resumed earlier this a.m. due to severe hypertension 7/30 Hypertension persist despite adding oral agents, glucose remains elevated as well 7/31 Bleeding around trach site, bright red blood. Copious bloody secretions. Cleviprex off, SBPs 150s-160s. Basal insulin/TF coverage increased. Cefepime deescalated to ceftriaxone. CXR stable. Later in afternoon, continued peritrach bleeding. Bronchoscopic eval completed with intratracheal bleeding associated with trach. Removed, patient orally reintubated, stoma packed. 8/1 ENT consulted for trach revision. Lightly sedated. Vent full support/PRVC, given fatigue following events of yesterday. ENT attempted to evaluate trach at bedside, could not pass  scope. Plan for OR 8/2. ASA/Brilinta held. 8/2 to OR for trach redo 8/6 trach collar during day shift, back on vent overnight  Interim History / Subjective:   Went 24 hours off vent Tmax 100.1 Moving his mouth some, extends right arm   Objective   Blood pressure (!) 170/99, pulse 84, temperature 99.1 F (37.3 C), temperature source Axillary, resp. rate (!) 25, height '5\' 7"'$  (1.702 m), weight 83.2 kg, SpO2 93 %.    Vent Mode: Stand-by FiO2 (%):  [30 %-35 %] 35 %   Intake/Output Summary (Last 24 hours) at 08/17/2021 0739 Last data filed at 08/17/2021 0600 Gross per 24 hour  Intake 1956.25 ml  Output 1500 ml  Net 456.25 ml   Filed Weights   08/15/21 0403 08/15/21 1000 08/16/21 0500  Weight: 85.3 kg 82.7 kg 83.2 kg    Examination:  General:  Resting comfortably in bed HENT: NCAT OP clear ETT in place PULM: CTA B, normal effort CV: RRR, no mgr GI: BS+, soft, nontender MSK: normal bulk and tone Neuro: awake, alert, no distress, will move his mouth as if he is trying to speak and extends his right arm   Resolved Hospital Problem list   Enterobacter HCAP  Assessment & Plan:  Acute hypoxemic respiratory failure in setting of stroke Tracheostomy status > bleeding from initial tracheostomy, s/p redo tracheostomy 8/2 by ENT Continue to monitor off vent Trach management per routine Pulm toilette Monitor for bleeding  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 to R vertebro-basilar junction Hypertension> difficult to control ASA, brilinta Statin Goal SBP < 140 Continue amlodipine, hctz, clonidine Increase coreg  DM2 with hyperglycemia Continue levemir 30 q12h SSI and tube feeding coverage  Dispo:  transfer to floor, TRH service, PCCM follow for tracheostomy management  Best Practice (right click and "Reselect all SmartList Selections" daily)   Diet/type: tubefeeds DVT prophylaxis: prophylactic heparin  GI prophylaxis:  PPI Lines: N/A Foley:  N/A Code Status:  full code Last date of multidisciplinary goals of care discussion [8/1]  Critical care time: n/a     Roselie Awkward, MD [place] [facility] Pager: [phone] Cell: [phone] After 7:00 pm call Elink  [phone]

## 2021-08-18 ENCOUNTER — Encounter (HOSPITAL_COMMUNITY): Payer: Self-pay | Admitting: Otolaryngology

## 2021-08-18 LAB — GLUCOSE, CAPILLARY
Glucose-Capillary: 327 mg/dL — ABNORMAL HIGH (ref 70–99)
Glucose-Capillary: 305 mg/dL — ABNORMAL HIGH (ref 70–99)
Glucose-Capillary: 118 mg/dL — ABNORMAL HIGH (ref 70–99)
Glucose-Capillary: 239 mg/dL — ABNORMAL HIGH (ref 70–99)
Glucose-Capillary: 172 mg/dL — ABNORMAL HIGH (ref 70–99)
Glucose-Capillary: 163 mg/dL — ABNORMAL HIGH (ref 70–99)

## 2021-08-18 LAB — COMPREHENSIVE METABOLIC PANEL
ALT: 58 U/L — ABNORMAL HIGH (ref 0–44)
AST: 23 U/L (ref 15–41)
Albumin: 2.6 g/dL — ABNORMAL LOW (ref 3.5–5.0)
Alkaline Phosphatase: 40 U/L (ref 38–126)
Anion gap: 11 (ref 5–15)
BUN: 21 mg/dL (ref 8–23)
CO2: 27 mmol/L (ref 22–32)
Calcium: 8.9 mg/dL (ref 8.9–10.3)
Chloride: 99 mmol/L (ref 98–111)
Creatinine, Ser: 0.76 mg/dL (ref 0.61–1.24)
GFR, Estimated: >60 mL/min (ref >60)
Glucose, Bld: 370 mg/dL — ABNORMAL HIGH (ref 70–99)
Potassium: 4 mmol/L (ref 3.5–5.1)
Sodium: 137 mmol/L (ref 135–145)
Total Bilirubin: 0.4 mg/dL (ref 0.3–1.2)
Total Protein: 6.2 g/dL — ABNORMAL LOW (ref 6.5–8.1)

## 2021-08-18 LAB — CBC WITH DIFFERENTIAL/PLATELET
Abs Immature Granulocytes: 0.06 10*3/uL (ref 0.00–0.07)
Basophils Absolute: 0.1 10*3/uL (ref 0.0–0.1)
Basophils Relative: 1 %
Eosinophils Absolute: 0.2 10*3/uL (ref 0.0–0.5)
Eosinophils Relative: 2 %
HCT: 36 % — ABNORMAL LOW (ref 39.0–52.0)
Hemoglobin: 11.9 g/dL — ABNORMAL LOW (ref 13.0–17.0)
Immature Granulocytes: 1 %
Lymphocytes Relative: 12 %
Lymphs Abs: 1.2 10*3/uL (ref 0.7–4.0)
MCH: 28.9 pg (ref 26.0–34.0)
MCHC: 33.1 g/dL (ref 30.0–36.0)
MCV: 87.4 fL (ref 80.0–100.0)
Monocytes Absolute: 0.9 10*3/uL (ref 0.1–1.0)
Monocytes Relative: 9 %
Neutro Abs: 7.4 10*3/uL (ref 1.7–7.7)
Neutrophils Relative %: 75 %
Platelets: 400 10*3/uL (ref 150–400)
RBC: 4.12 MIL/uL — ABNORMAL LOW (ref 4.22–5.81)
RDW: 13.4 % (ref 11.5–15.5)
WBC: 9.7 10*3/uL (ref 4.0–10.5)
nRBC: 0 % (ref 0.0–0.2)

## 2021-08-18 LAB — MAGNESIUM: Magnesium: 1.9 mg/dL (ref 1.7–2.4)

## 2021-08-18 MED ORDER — INSULIN ASPART 100 UNIT/ML IJ SOLN
18.0000 [IU] | Freq: Three times a day (TID) | INTRAMUSCULAR | Status: DC
Start: 2021-08-18 — End: 2021-08-22
  Administered 2021-08-18 – 2021-08-22 (×9): 18 [IU] via SUBCUTANEOUS

## 2021-08-18 NOTE — Progress Notes (Signed)
Physical Therapy Treatment Patient Details Name: Joshua Donovan MRN: [id number] DOB: 06-03-1955 Today's Date: 08/18/2021   History of Present Illness Pt is a 66 y.o. male who presented 08/02/21 with R foot pain and R-sided weakness. Transferred to [facility] 7/25. 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 S/p stent placement to the R vertebrobasilar junction 7/26, failed extubation. Trach and PEG placed 7/28. S/p bronchoscopic evaluation, oral reintubation and tracheostomy removal with stoma packing 7/31 due to trach site bleeding. S/p trach revision 8/2. PMH: DM, GERD, TBI with prior ICH, HLD, HTN    PT Comments    The pt was seen for continued mobility progression and OOB mobility. The pt remains unable to complete any voluntary or spontaneous movement in extremities, core, or neck. He is able to consistently communicate with eye movement and attempts to mouth yes/no. The pt required totalA of 2 to safely complete rolling  in bed for placement of lift pad and then was dependent on lift for transfer to recliner. Once in recliner, the pt needed trunk and head support to maintain position, but was able to tolerate with VSS. Will continue to progress with OOB activity tolerance and positioning.    Recommendations for follow up therapy are one component of a multi-disciplinary discharge planning process, led by the attending physician.  Recommendations may be updated based on patient status, additional functional criteria and insurance authorization.  Follow Up Recommendations  Skilled nursing-short term rehab (<3 hours/day) (vs LTACH) Can  patient physically be transported by private vehicle: No   Assistance Recommended at Discharge Frequent or constant Supervision/Assistance  Patient can return home with the following Assistance with cooking/housework;Two people to help with walking and/or transfers;Two people to help with bathing/dressing/bathroom;Assistance with feeding;Direct supervision/assist for medications management;Direct supervision/assist for financial management;Assist for transportation;Help with stairs or ramp for entrance   Equipment Recommendations  Other (comment) (TBD (likely tilt-in-space WC with head and trunk support))    Recommendations for Other Services       Precautions / Restrictions Precautions Precautions: Fall;Other (comment) Precaution Comments: trach collar this session, peg, SBP < 180, prevalon boots Restrictions Weight Bearing Restrictions: No     Mobility  Bed Mobility Overal bed mobility: Needs Assistance Bed Mobility: Rolling Rolling: Total assist, +2 for physical assistance, +2 for safety/equipment         General bed mobility comments: totalA to roll for placement of lift pad. Pt needing +2 assist to safely manage movement of extremities and head    Transfers Overall transfer level: Needs assistance Equipment used: Ambulation equipment used Transfers: Bed to chair/wheelchair/BSC             General transfer comment: dependent on lift, +2 to manage lines, assist and head position Transfer via Lift Equipment: Maximove     Balance Overall balance assessment: Needs assistance Sitting-balance support: No upper extremity supported Sitting balance-Leahy Scale: Zero Sitting balance - Comments: dependent on therapist for trunk and head support                                    Cognition Arousal/Alertness: Awake/alert Behavior During Therapy:  WFL for tasks assessed/performed Overall Cognitive Status: Difficult to assess                                  General Comments: pt following cues to communicate with yes/no sign. does look at answer and attempt to mouth.        Exercises      General Comments General comments (skin integrity, edema, etc.): VSS on trach collar. BP stable with change in position      Pertinent Vitals/Pain Pain Assessment Pain Assessment: No/denies pain Pain Intervention(s): Monitored during session     PT Goals (current goals can now be found in the care plan section) Acute Rehab PT Goals Patient Stated Goal: did not state PT Goal Formulation: Patient unable to participate in goal setting Time For Goal Achievement: 09/01/21 Potential to Achieve Goals: Fair Progress towards PT goals: Progressing toward goals    Frequency    Min 3X/week      PT Plan Current plan remains appropriate       AM-PAC PT "6 Clicks" Mobility   Outcome Measure  Help needed turning from your back to your side while in a flat bed without using bedrails?: Total Help needed moving from lying on your back to sitting on the side of a flat bed without using bedrails?: Total Help needed moving to and from a bed to a chair (including a wheelchair)?: Total Help needed standing up from a chair using your arms (e.g., wheelchair or bedside chair)?: Total Help needed to walk in hospital room?: Total Help needed climbing 3-5 steps with a railing? : Total 6 Click Score: 6    End of Session Equipment Utilized During Treatment: Oxygen (lift) Activity Tolerance: Patient tolerated treatment well Patient left: in chair;with call bell/phone within reach;with chair alarm set;with nursing/sitter in room Nurse Communication: Mobility status PT Visit Diagnosis: Muscle weakness (generalized) (M62.81);Difficulty in walking, not elsewhere classified (R26.2);Other symptoms and signs involving the nervous system (R29.898);Unsteadiness on feet (R26.81) Hemiplegia - Right/Left: Right Hemiplegia - dominant/non-dominant:  Dominant Hemiplegia - caused by: Unspecified     Time: 2505-3976 PT Time Calculation (min) (ACUTE ONLY): 29 min  Charges:  $Therapeutic Activity: 23-37 mins                     [name], PT, DPT   Acute Rehabilitation Department   [name] 08/18/2021, 6:02 PM

## 2021-08-18 NOTE — Progress Notes (Signed)
Patient transferred to 4NP10. Family called and updated. They will update other family and friends. Pt's belongings taken with him. [name], Rande Brunt, RN

## 2021-08-18 NOTE — TOC Progression Note (Signed)
Transition of Care [facility]) - Progression Note    Patient Details  Name: Joshua Donovan MRN: [id number] Date of Birth: Feb 28, 1955  [facility]) CM/SW Contact  Oren Section Cleta Alberts, RN Phone Number: 08/18/2021, 1200  Clinical Narrative:    Per [facility], patient's insurance has denied LTAC.  Appeal initiated; appeal letter signed by Dr. Lake Bells and faxed to [employer], [employer].  Fax #  [phone].  Will await appeal results; TOC to provide updates as available. Family updated.    Expected Discharge Plan: Long Term Acute Care (LTAC) Barriers to Discharge: Continued Medical Work up  Expected Discharge Plan and Services Expected Discharge Plan: Long Term Acute Care (LTAC)   Discharge Planning Services: CM Consult Post Acute Care Choice: Long Term Acute Care (LTAC) Living arrangements for the past 2 months: Single Family Home                                       Social Determinants of Health (SDOH) Interventions    Readmission Risk Interventions     No data to display         Reinaldo Raddle, RN, BSN  Trauma/Neuro ICU Case Manager [phone]

## 2021-08-18 NOTE — Progress Notes (Signed)
Physical Therapy Treatment Patient Details Name: Joshua Donovan MRN: [id number] DOB: [birth year] Today's Date: 08/18/2021   History of Present Illness Pt is a 66 y.o. male who presented 08/02/21 with R foot pain and R-sided weakness. Transferred to [facility] 7/25. 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 S/p stent placement to the R vertebrobasilar junction 7/26, failed extubation. Trach and PEG placed 7/28. S/p bronchoscopic evaluation, oral reintubation and tracheostomy removal with stoma packing 7/31 due to trach site bleeding. S/p trach revision 8/2. PMH: DM, GERD, TBI with prior ICH, HLD, HTN    PT Comments    PT returned to assist with return to bed. The pt had tolerated sitting in recliner for 1.5 hours with VSS, but markedly increased secretions with upright position. The pt tolerated return to bed via maximove lift, requires +2 to safely manage head position and line management with lift. Will continue to benefit from skilled PT to progress OOB mobility and activity tolerance.     Recommendations for follow up therapy are one component of a multi-disciplinary discharge planning process, led by the attending physician.  Recommendations may be updated based on patient status, additional functional criteria and insurance authorization.  Follow Up Recommendations  Skilled nursing-short term rehab (<3 hours/day) (vs LTACH) Can patient physically be transported by private vehicle: No   Assistance Recommended at Discharge Frequent or constant Supervision/Assistance  Patient can return home with the following Assistance with  cooking/housework;Two people to help with walking and/or transfers;Two people to help with bathing/dressing/bathroom;Assistance with feeding;Direct supervision/assist for medications management;Direct supervision/assist for financial management;Assist for transportation;Help with stairs or ramp for entrance   Equipment Recommendations  Other (comment) (TBD (likely tilt-in-space WC with head and trunk support))    Recommendations for Other Services       Precautions / Restrictions Precautions Precautions: Fall;Other (comment) Precaution Comments: trach collar this session, peg, SBP < 180, prevalon boots Restrictions Weight Bearing Restrictions: No     Mobility  Bed Mobility Overal bed mobility: Needs Assistance Bed Mobility: Rolling Rolling: Total assist, +2 for physical assistance, +2 for safety/equipment         General bed mobility comments: totalA to roll for removal of lift pad. Pt needing +2 assist to safely manage movement of extremities and head    Transfers Overall transfer level: Needs assistance Equipment used: Ambulation equipment used Transfers: Bed to chair/wheelchair/BSC             General transfer comment: dependent on lift, +2 to manage lines, assist and head position. tolerated chair for 1.5 hours before being returned to bed Transfer via Lift Equipment: [place] Overall balance assessment: Needs assistance Sitting-balance support: No upper extremity supported Sitting balance-Leahy Scale: Zero Sitting balance - Comments: dependent on therapist for trunk and head support                                    Cognition Arousal/Alertness: Awake/alert Behavior During Therapy: WFL for tasks assessed/performed Overall Cognitive Status: Difficult to assess  General Comments: pt following cues to communicate with yes/no sign. does look at answer and attempt to mouth words         Exercises      General Comments General comments (skin integrity, edema, etc.): VSS on trach collar      Pertinent Vitals/Pain Pain Assessment Pain Assessment: No/denies pain Pain Intervention(s): Monitored during session     PT Goals (current goals can now be found in the care plan section) Acute Rehab PT Goals Patient Stated Goal: did not state PT Goal Formulation: Patient unable to participate in goal setting Time For Goal Achievement: 09/01/21 Potential to Achieve Goals: Fair Progress towards PT goals: Progressing toward goals    Frequency    Min 3X/week      PT Plan Current plan remains appropriate       AM-PAC PT "6 Clicks" Mobility   Outcome Measure  Help needed turning from your back to your side while in a flat bed without using bedrails?: Total Help needed moving from lying on your back to sitting on the side of a flat bed without using bedrails?: Total Help needed moving to and from a bed to a chair (including a wheelchair)?: Total Help needed standing up from a chair using your arms (e.g., wheelchair or bedside chair)?: Total Help needed to walk in hospital room?: Total Help needed climbing 3-5 steps with a railing? : Total 6 Click Score: 6    End of Session Equipment Utilized During Treatment: Oxygen (lift) Activity Tolerance: Patient tolerated treatment well Patient left: in bed;with call bell/phone within reach;with bed alarm set;with nursing/sitter in room Nurse Communication: Mobility status PT Visit Diagnosis: Muscle weakness (generalized) (M62.81);Difficulty in walking, not elsewhere classified (R26.2);Other symptoms and signs involving the nervous system (R29.898);Unsteadiness on feet (R26.81) Hemiplegia - Right/Left: Right Hemiplegia - dominant/non-dominant: Dominant Hemiplegia - caused by: Unspecified     Time: 8832-5498 PT Time Calculation (min) (ACUTE ONLY): 19 min  Charges:  $Therapeutic Activity: 8-22 mins                      [name], PT, DPT   Acute Rehabilitation Department   [name] 08/18/2021, 6:09 PM

## 2021-08-18 NOTE — Progress Notes (Signed)
PROGRESS NOTE    Joshua Donovan  AUQ:[id number] DOB: 11/26/1955 DOA: 08/03/2021 PCP: [facility] [place]    Brief Narrative:  Patient with history of intracranial hemorrhage, type 2 diabetes, hypertension hyperlipidemia presented to [facility] on 7/24 with right hand numbness and weakness, initially found to have a small brainstem stroke and ventral medulla.  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  He was intubated and transferred to [facility] for interventional radiology intervention.  Remained in the intensive care unit.  Now with tracheostomy and PEG tube placement.  Pending LTAC transfer. 7/24, admitted to [facility] and then subsequent transfer to [facility]. 7/28, tracheostomy and PEG tube placement 8/6, trach collar during day shift, back on vent overnight. 8/9, transferred to medical floor on tracheostomy now.   Assessment & Plan:   Acute hypoxemic respiratory failure in the setting of brainstem stroke: Currently on trach collar.  Pulmonology following.  Tracheostomy care.  Monitor for further bleeding.  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  Seen by neurology. Currently on aspirin and Brilinta, continue dual antiplatelet therapy due to intracranial stenosis. Tolerating statin. Blood pressure stable on amlodipine, hydrochlorothiazide and clonidine.  Goal blood pressure less than 140.  Type 2 diabetes with hyperglycemia: Now with bolus tube feeding.  Blood sugars are greatly elevated.  On sliding scale insulin.  Start NovoLog along with bolus feeding.  Continue Levemir 45 units twice daily.  Dysphagia secondary to stroke: Has a PEG tube.  N.p.o.  Ongoing PT OT and speech follow-up.   DVT prophylaxis: heparin injection 5,000 Units Start: 08/05/21 1400 SCD's  Start: 08/03/21 2311   Code Status: Full code. Family Communication: None. Disposition Plan: Status is: Inpatient Remains inpatient appropriate because: Needs LTAC placement     Consultants:  Critical care Neurointervention Neurology  Procedures:  Tracheostomy PEG tube placement  Antimicrobials:  None   Subjective: Patient seen and examined.  He was just transferred out of ICU.  Blood pressure is stable.  Patient is able to speak with some muffled voice.  He is able to follow commands and blinks eyes on instructions to say yes and no.  He tells me that he has no trouble breathing.  He does feel some congestion.  Denies any other complaints.  Objective: Vitals:   08/18/21 0900 08/18/21 1000 08/18/21 1045 08/18/21 1132  BP: 119/67 137/75  (!) 142/79  Pulse: 79 85 85 86  Resp: (!) 38 '20 20 20  '$ Temp:  98.5 F (36.9 C)  98.2 F (36.8 C)  TempSrc:  Oral  Axillary  SpO2: 93% 95% 99% 99%  Weight:      Height:        Intake/Output Summary (Last 24 hours) at 08/18/2021 1413 Last data filed at 08/18/2021 1000 Gross per 24 hour  Intake 2178 ml  Output 1700 ml  Net 478 ml   Filed Weights   08/15/21 1000 08/16/21 0500 08/18/21 0500  Weight: 82.7 kg 83.2 kg 84.2 kg    Examination:  General: Frail looking gentleman.  Not in any distress. Cardiovascular: S1-S2 normal.  Regular rate rhythm. Respiratory: Trach collar with some clotted blood around the dressing.  He has plenty of conducted upper airway sounds.  Looks fairly comfortable. Gastrointestinal: Soft.  Nontender.  Distended.  PEG tube in place. Ext: No deformities or swelling. Neuro: Alert awake.  Follows commands and tries to voice  some words however unable to understand. He does not have any movements of the all 4 extremities.     Data Reviewed: I have personally reviewed following labs and imaging studies  CBC: Recent Labs  Lab 08/12/21 0222 08/13/21 0247 08/17/21 0252 08/18/21 0238  WBC 8.3 11.2* 11.1*  9.7  NEUTROABS  --   --  8.6* 7.4  HGB 11.8* 12.7* 12.7* 11.9*  HCT 36.2* 38.0* 38.8* 36.0*  MCV 90.0 86.4 89.0 87.4  PLT 333 407* 429* 323   Basic Metabolic Panel: Recent Labs  Lab 08/12/21 0222 08/13/21 0247 08/14/21 0139 08/17/21 0252 08/18/21 0238  NA 140 141 140 141 137  K 4.3 3.4* 3.7 3.3* 4.0  CL 105 104 104 104 99  CO2 '27 27 28 28 27  '$ GLUCOSE 251* 136* 229* 110* 370*  BUN '22 15 16 16 21  '$ CREATININE 0.78 0.72 0.78 0.73 0.76  CALCIUM 8.7* 8.8* 8.8* 9.1 8.9  MG 1.8 1.8 2.1 1.9 1.9  PHOS 4.5 3.0  --   --   --    GFR: Estimated Creatinine Clearance: 94.2 mL/min (by C-G formula based on SCr of 0.76 mg/dL). Liver Function Tests: Recent Labs  Lab 08/17/21 0252 08/18/21 0238  AST 30 23  ALT 74* 58*  ALKPHOS 37* 40  BILITOT 0.5 0.4  PROT 6.5 6.2*  ALBUMIN 2.8* 2.6*   No results for input(s): "LIPASE", "AMYLASE" in the last 168 hours. No results for input(s): "AMMONIA" in the last 168 hours. Coagulation Profile: No results for input(s): "INR", "PROTIME" in the last 168 hours. Cardiac Enzymes: No results for input(s): "CKTOTAL", "CKMB", "CKMBINDEX", "TROPONINI" in the last 168 hours. BNP (last 3 results) No results for input(s): "PROBNP" in the last 8760 hours. HbA1C: No results for input(s): "HGBA1C" in the last 72 hours. CBG: Recent Labs  Lab 08/17/21 1141 08/17/21 1654 08/17/21 2106 08/18/21 0829 08/18/21 1145  GLUCAP 240* 194* 267* 327* 305*   Lipid Profile: No results for input(s): "CHOL", "HDL", "LDLCALC", "TRIG", "CHOLHDL", "LDLDIRECT" in the last 72 hours. Thyroid Function Tests: No results for input(s): "TSH", "T4TOTAL", "FREET4", "T3FREE", "THYROIDAB" in the last 72 hours. Anemia Panel: No results for input(s): "VITAMINB12", "FOLATE", "FERRITIN", "TIBC", "IRON", "RETICCTPCT" in the last 72 hours. Sepsis Labs: No results for input(s): "PROCALCITON", "LATICACIDVEN" in the last 168 hours.  No results found for this or any previous visit (from  the past 240 hour(s)).       Radiology Studies: No results found.      Scheduled Meds:  amLODipine  10 mg Per Tube Daily   aspirin  81 mg Per Tube Daily   atorvastatin  80 mg Per Tube Daily   carvedilol  25 mg Per Tube BID WC   Chlorhexidine Gluconate Cloth  6 each Topical Q0600   cloNIDine  0.1 mg Per Tube TID   feeding supplement ([name])  474 mL Per Tube TID WC   feeding supplement (PROSource TF)  45 mL Per Tube Daily   fiber  1 packet Per Tube BID   folic acid  1 mg Per Tube Daily   free water  200 mL Per Tube Q6H   heparin injection (subcutaneous)  5,000 Units Subcutaneous Q8H   hydrochlorothiazide  12.5 mg Per Tube Daily   insulin aspart  0-20 Units Subcutaneous TID WC   insulin aspart  0-5 Units Subcutaneous QHS   insulin aspart  18 Units Subcutaneous TID WC   insulin detemir  45 Units Subcutaneous BID  loratadine  10 mg Per Tube Daily   multivitamin  15 mL Per Tube Daily   mouth rinse  15 mL Mouth Rinse Q2H   thiamine  100 mg Per Tube Daily   ticagrelor  90 mg Per Tube BID   Continuous Infusions:   LOS: 15 days    Time spent: 52 minutes    Barb Merino, MD [facility] Pager [phone]

## 2021-08-19 LAB — GLUCOSE, CAPILLARY
Glucose-Capillary: 209 mg/dL — ABNORMAL HIGH (ref 70–99)
Glucose-Capillary: 215 mg/dL — ABNORMAL HIGH (ref 70–99)
Glucose-Capillary: 202 mg/dL — ABNORMAL HIGH (ref 70–99)
Glucose-Capillary: 98 mg/dL (ref 70–99)
Glucose-Capillary: 289 mg/dL — ABNORMAL HIGH (ref 70–99)

## 2021-08-19 MED ORDER — FREE WATER
100.0000 mL | Freq: Three times a day (TID) | Status: DC
  Administered 2021-08-19 – 2021-08-22 (×8): 100 mL

## 2021-08-19 MED ORDER — PROSOURCE TF20 ENFIT COMPATIBL EN LIQD
60.0000 mL | Freq: Every day | ENTERAL | Status: DC
  Administered 2021-08-20 – 2021-11-26 (×99): 60 mL
  Filled 2021-08-19 (×98): qty 60

## 2021-08-19 MED ORDER — ADULT MULTIVITAMIN W/MINERALS CH
1.0000 | ORAL_TABLET | Freq: Every day | ORAL | Status: DC
  Administered 2021-08-20 – 2021-11-26 (×99): 1
  Filled 2021-08-19 (×99): qty 1

## 2021-08-19 MED ORDER — INSULIN DETEMIR 100 UNIT/ML ~~LOC~~ SOLN
50.0000 [IU] | Freq: Two times a day (BID) | SUBCUTANEOUS | Status: DC
  Administered 2021-08-19 – 2021-08-22 (×7): 50 [IU] via SUBCUTANEOUS
  Filled 2021-08-19 (×8): qty 0.5

## 2021-08-19 NOTE — Progress Notes (Signed)
Nutrition Follow-up  DOCUMENTATION CODES:   Not applicable  INTERVENTION:   Tube Feeding via PEG:  2 cartons of Jevity 1.5 TID Pro-Source TF 20 daily  Provides: 2210 kcal, 110 grams protein, and 1080 ml free water.   50 ml free water before and after each bolus feeding 200 ml free water bolus every 6 hours Total free water: 2180 ml   MVI with minerals daily   NUTRITION DIAGNOSIS:   Inadequate oral intake related to acute illness as evidenced by NPO status. Ongoing.   GOAL:   Patient will meet greater than or equal to 90% of their needs Met with TF at goal  MONITOR:   Vent status, Labs, Weight trends, TF tolerance  REASON FOR ASSESSMENT:   Consult Enteral/tube feeding initiation and management  ASSESSMENT:   66 yo male admitted post brain stem stroke involving ventral medulla requiring stent placement to right vertebro-basilar junction. PMH includes HTN, TBI, DM, HLD  Pt remains on trach collar.  Pt with medial medullary syndrome with quadriplegia/quadriparesis   7/24 Admitted to [facility], ventral medulla CVA 7/25 Tx to [facility] 7/26 Cerebral angiogram with stent placement to right vertebro-basilar junction, failed extubation, reintubated 7/28 s/p PEG placement 8/2 s/p trach revision with ENT in the OR 8/9 tx to floor  Labs: CBGs 118-305  Meds: nutrisource fiber BID, folic acid, ss novolog, 18 units novolog TID, 50 units levemir BID, liquid MVI, Thiamine   Diet Order:   Diet Order             Diet NPO time specified  Diet effective midnight                   EDUCATION NEEDS:   Not appropriate for education at this time  Skin:  Skin Assessment: Reviewed RN Assessment  Last BM:  8/9  Height:   Ht Readings from Last 1 Encounters:  08/04/21 5' 7"  (1.702 m)    Weight:   Wt Readings from Last 1 Encounters:  08/19/21 84.7 kg    BMI:  Body mass index is 29.25 kg/m.  Estimated Nutritional Needs:   Kcal:  2000-2200 kcals  Protein:   100-115 g  Fluid:  >/= 2 L  Darianna Amy P., RD, LDN, CNSC See AMiON for contact information

## 2021-08-19 NOTE — Progress Notes (Signed)
PROGRESS NOTE    Joshua Donovan  OFB:[id number] DOB: 04-Jul-1955 DOA: 08/03/2021 PCP: [facility] [place]    Brief Narrative:  Patient with history of intracranial hemorrhage, type 2 diabetes, hypertension hyperlipidemia presented to [facility] regional hospital on 7/24 with right hand numbness and weakness, initially found to have a small brainstem stroke and ventral medulla.  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  He was intubated and transferred to [facility] for interventional radiology intervention.  Remained in the intensive care unit.  Now with tracheostomy and PEG tube placement.  Pending LTAC transfer. 7/24, admitted to [facility] and then subsequent transfer to [facility]. 7/28, tracheostomy and PEG tube placement 8/6, trach collar during day shift, back on vent overnight. 8/9, transferred to medical floor on tracheostomy now.   Assessment & Plan:   Acute hypoxemic respiratory failure in the setting of brainstem stroke: Currently on trach collar.  Pulmonology following.  Tracheostomy care.  Monitor for further bleeding.  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  Seen by neurology. Currently on aspirin and Brilinta, continue dual antiplatelet therapy due to intracranial stenosis. Tolerating statin. Blood pressure stable on amlodipine, hydrochlorothiazide and clonidine.  Goal blood pressure less than 140.  Type 2 diabetes with hyperglycemia: Now with bolus tube feeding.  Blood sugars are greatly elevated.  On sliding scale insulin.  On Levemir 45 units twice daily, increase to 50 units twice daily.  NovoLog 15 units with bolus feeding.   Dysphagia secondary to stroke: Has a PEG tube.  N.p.o.  Ongoing PT OT and speech follow-up.  Nutrition following.   DVT prophylaxis: heparin  injection 5,000 Units Start: 08/05/21 1400 SCD's Start: 08/03/21 2311   Code Status: Full code. Family Communication: None. Disposition Plan: Status is: Inpatient Remains inpatient appropriate because: Needs LTAC placement/long-term care with trach and PEG.     Consultants:  Critical care Neurointervention Neurology  Procedures:  Tracheostomy PEG tube placement  Antimicrobials:  None   Subjective: Patient seen and examined.  No overnight events.  Blinks with his eyes that he is doing okay.  Tolerating tube feedings.  Objective: Vitals:   08/19/21 0819 08/19/21 1120 08/19/21 1210 08/19/21 1528  BP: (!) 154/87 (!) 156/82 (!) 151/87 136/79  Pulse: 87 80 86 78  Resp: (!) 33 (!) 32 (!) 35 (!) 29  Temp:   98.9 F (37.2 C) 98.4 F (36.9 C)  TempSrc:   Oral Oral  SpO2: 94% 95% 97% 96%  Weight:      Height:        Intake/Output Summary (Last 24 hours) at 08/19/2021 1533 Last data filed at 08/19/2021 1215 Gross per 24 hour  Intake --  Output 1400 ml  Net -1400 ml    Filed Weights   08/16/21 0500 08/18/21 0500 08/19/21 0500  Weight: 83.2 kg 84.2 kg 84.7 kg    Examination:  General: Frail looking gentleman.  Not in any distress. Unable to move any extremities. Cardiovascular: S1-S2 normal.  Regular rate rhythm. Respiratory: Trach collar ,conducted upper airway sounds.  Looks fairly comfortable. SpO2: 96 % O2 Flow Rate (L/min): 5 L/min FiO2 (%): 28 %  Gastrointestinal: Soft.  Nontender.  Distended.  PEG tube in place. Ext: No deformities or swelling. Neuro: Alert awake.  Follows commands and voices but unable to move any extremities.    Data Reviewed: I have personally reviewed following labs and  imaging studies  CBC: Recent Labs  Lab 08/13/21 0247 08/17/21 0252 08/18/21 0238  WBC 11.2* 11.1* 9.7  NEUTROABS  --  8.6* 7.4  HGB 12.7* 12.7* 11.9*  HCT 38.0* 38.8* 36.0*  MCV 86.4 89.0 87.4  PLT 407* 429* 376    Basic Metabolic Panel: Recent Labs  Lab  08/13/21 0247 08/14/21 0139 08/17/21 0252 08/18/21 0238  NA 141 140 141 137  K 3.4* 3.7 3.3* 4.0  CL 104 104 104 99  CO2 '27 28 28 27  '$ GLUCOSE 136* 229* 110* 370*  BUN '15 16 16 21  '$ CREATININE 0.72 0.78 0.73 0.76  CALCIUM 8.8* 8.8* 9.1 8.9  MG 1.8 2.1 1.9 1.9  PHOS 3.0  --   --   --     GFR: Estimated Creatinine Clearance: 94.4 mL/min (by C-G formula based on SCr of 0.76 mg/dL). Liver Function Tests: Recent Labs  Lab 08/17/21 0252 08/18/21 0238  AST 30 23  ALT 74* 58*  ALKPHOS 37* 40  BILITOT 0.5 0.4  PROT 6.5 6.2*  ALBUMIN 2.8* 2.6*    No results for input(s): "LIPASE", "AMYLASE" in the last 168 hours. No results for input(s): "AMMONIA" in the last 168 hours. Coagulation Profile: No results for input(s): "INR", "PROTIME" in the last 168 hours. Cardiac Enzymes: No results for input(s): "CKTOTAL", "CKMB", "CKMBINDEX", "TROPONINI" in the last 168 hours. BNP (last 3 results) No results for input(s): "PROBNP" in the last 8760 hours. HbA1C: No results for input(s): "HGBA1C" in the last 72 hours. CBG: Recent Labs  Lab 08/18/21 2315 08/19/21 0330 08/19/21 0742 08/19/21 1205 08/19/21 1527  GLUCAP 163* 209* 215* 202* 98    Lipid Profile: No results for input(s): "CHOL", "HDL", "LDLCALC", "TRIG", "CHOLHDL", "LDLDIRECT" in the last 72 hours. Thyroid Function Tests: No results for input(s): "TSH", "T4TOTAL", "FREET4", "T3FREE", "THYROIDAB" in the last 72 hours. Anemia Panel: No results for input(s): "VITAMINB12", "FOLATE", "FERRITIN", "TIBC", "IRON", "RETICCTPCT" in the last 72 hours. Sepsis Labs: No results for input(s): "PROCALCITON", "LATICACIDVEN" in the last 168 hours.  No results found for this or any previous visit (from the past 240 hour(s)).       Radiology Studies: No results found.      Scheduled Meds:  amLODipine  10 mg Per Tube Daily   aspirin  81 mg Per Tube Daily   atorvastatin  80 mg Per Tube Daily   carvedilol  25 mg Per Tube BID WC    Chlorhexidine Gluconate Cloth  6 each Topical Q0600   cloNIDine  0.1 mg Per Tube TID   feeding supplement ([name])  474 mL Per Tube TID WC   [START ON 08/20/2021] feeding supplement (PROSource TF20)  60 mL Per Tube Daily   fiber  1 packet Per Tube BID   folic acid  1 mg Per Tube Daily   free water  100 mL Per Tube TID WC   free water  200 mL Per Tube Q6H   heparin injection (subcutaneous)  5,000 Units Subcutaneous Q8H   hydrochlorothiazide  12.5 mg Per Tube Daily   insulin aspart  0-20 Units Subcutaneous TID WC   insulin aspart  0-5 Units Subcutaneous QHS   insulin aspart  18 Units Subcutaneous TID WC   insulin detemir  50 Units Subcutaneous BID   loratadine  10 mg Per Tube Daily   [START ON 08/20/2021] multivitamin with minerals  1 tablet Per Tube Daily   mouth rinse  15 mL Mouth Rinse Q2H  thiamine  100 mg Per Tube Daily   ticagrelor  90 mg Per Tube BID   Continuous Infusions:   LOS: 16 days    Time spent: 35 minutes    Barb Merino, MD [facility] Pager [phone]

## 2021-08-19 NOTE — Care Management Important Message (Signed)
Important Message  Patient Details  Name: Joshua Donovan MRN: [id number] Date of Birth: Jul 28, 1955   Medicare Important Message Given:  Yes     [name] 08/19/2021, 11:01 AM

## 2021-08-19 NOTE — Evaluation (Signed)
Occupational Therapy Evaluation Patient Details Name: Joshua Donovan MRN: [id number] DOB: 08/20/1955 Today's Date: 08/19/2021   History of Present Illness Pt is a 66 y.o. male who presented 08/02/21 with R foot pain and R-sided weakness. Transferred to [facility] 7/25. 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 S/p stent placement to the R vertebrobasilar junction 7/26, failed extubation. Trach and PEG placed 7/28. S/p bronchoscopic evaluation, oral reintubation and tracheostomy removal with stoma packing 7/31 due to trach site bleeding. S/p trach revision 8/2. PMH: DM, GERD, TBI with prior ICH, HLD, HTN   Clinical Impression   Pt seen to work on hold/steadying his eye gaze for potential communication devices, as well as PROM and chair position activity tolerance. He continues to have increased secretions when sitting upright, however he is able to cough them up well to be suctioned out. When working on answering yes/no questions with vision board, OT educated pt to make lateral gazes more prominent and hold them for 5+seconds before returned to neutral eye gaze, pt making good progress with this task throughout session. Additionally, pt continues to have WNL PROM of BUE. OT will continue to follow acutely.      Recommendations for follow up therapy are one component of a multi-disciplinary discharge planning process, led by the attending physician.  Recommendations may be updated based on patient status, additional functional criteria and insurance authorization.   Follow Up Recommendations  OT at Long-term acute care hospital    Assistance  Recommended at Discharge Frequent or constant Supervision/Assistance  Patient can return home with the following Two people to help with walking and/or transfers;Two people to help with bathing/dressing/bathroom;Assistance with cooking/housework;Assistance with feeding;Direct supervision/assist for medications management;Direct supervision/assist for financial management;Assist for transportation;Help with stairs or ramp for entrance    Functional Status Assessment     Equipment Recommendations  Other (comment) (TBD)    Recommendations for Other Services       Precautions / Restrictions Precautions Precautions: Fall;Other (comment) Precaution Comments: trach collar this session, peg, SBP < 180, prevalon boots Restrictions Weight Bearing Restrictions: No      Mobility Bed Mobility Overal bed mobility: Needs Assistance Bed Mobility: Rolling Rolling: Total assist, +2 for physical assistance, +2 for safety/equipment         General bed mobility comments: Total A +2 for rolling to either side for pressure relief and to scoot up in bed    Transfers                   General transfer comment: Deferred due to no +2 assist      Balance                                           ADL either performed or assessed with clinical judgement   ADL Overall ADL's : Needs assistance/impaired                                       General ADL Comments:  total A for all aspects of his care     Vision         Perception     Praxis      Pertinent Vitals/Pain Pain Assessment Pain Assessment: No/denies pain     Hand Dominance     Extremity/Trunk Assessment             Communication     Cognition Arousal/Alertness: Awake/alert Behavior During Therapy: WFL for tasks assessed/performed Overall Cognitive Status: Difficult to assess                                       General Comments  VSS on trach collar, RT in  room to suction increased secreations in chair position    Exercises Exercises: General Upper Extremity General Exercises - Upper Extremity Shoulder Flexion: PROM, Both, 10 reps, Supine Shoulder ABduction: PROM, Both, 10 reps, Supine Shoulder Horizontal ABduction: PROM, Both, 10 reps, Supine Shoulder Horizontal ADduction: PROM, Both, 10 reps, Supine Elbow Flexion: PROM, Both, 10 reps, Supine Elbow Extension: PROM, Both, 10 reps, Supine Wrist Flexion: PROM, Both, 10 reps, Supine Wrist Extension: PROM, Both, 10 reps, Supine Digit Composite Flexion: PROM, Both, 10 reps, Supine Composite Extension: PROM, Both, 10 reps, Supine Other Exercises Other Exercises: PROM Cervical tilts and rotations both sides x5 Other Exercises: Holding Eye gaze laterally for practice with hopes to use an eye gaze communication device.   Shoulder Instructions      Home Living                                          Prior Functioning/Environment                          OT Problem List:        OT Treatment/Interventions:      OT Goals(Current goals can be found in the care plan section) Acute Rehab OT Goals Patient Stated Goal: none stted OT Goal Formulation: Patient unable to participate in goal setting Time For Goal Achievement: 09/02/21 Potential to Achieve Goals: Fair ADL Goals Pt/caregiver will Perform Home Exercise Program: Both right and left upper extremity;With written HEP provided Additional ADL Goal #1: Pt's family will be educated about bed positioning to avoid pressure sores and increase care of skin Additional ADL Goal #2: Pt will use soft touch call bell to call nurses by moving head with min assist. Additional ADL Goal #3: Therapist to explore other options for call bell use if pt unable to move head to use soft touch system.  OT Frequency: Min 2X/week    Co-evaluation              AM-PAC OT "6 Clicks" Daily Activity     Outcome Measure Help from  another person eating meals?: Total Help from another person taking care of personal grooming?: Total Help from another person toileting, which includes using toliet, bedpan, or urinal?: Total Help from another person bathing (including washing, rinsing, drying)?: Total Help from another person to put on and taking off regular upper body clothing?: Total Help from another person to put on and taking off regular lower body clothing?: Total 6 Click Score: 6   End of Session Equipment Utilized During Treatment: Oxygen Nurse Communication: Mobility status  Activity Tolerance:  Patient tolerated treatment well Patient left: in bed;with bed alarm set  OT Visit Diagnosis: Other symptoms and signs involving the nervous system (P39.688)                Time: 6484-7207 OT Time Calculation (min): 29 min Charges:  OT General Charges $OT Visit: 1 Visit OT Treatments $Therapeutic Activity: 8-22 mins $Therapeutic Exercise: 8-22 mins  Brentlee Sciara H., OTR/L Acute Rehabilitation  Sixto Bowdish Elane Beckam Abdulaziz 08/19/2021, 2:02 PM

## 2021-08-20 LAB — GLUCOSE, CAPILLARY
Glucose-Capillary: 154 mg/dL — ABNORMAL HIGH (ref 70–99)
Glucose-Capillary: 293 mg/dL — ABNORMAL HIGH (ref 70–99)
Glucose-Capillary: 260 mg/dL — ABNORMAL HIGH (ref 70–99)
Glucose-Capillary: 104 mg/dL — ABNORMAL HIGH (ref 70–99)

## 2021-08-20 NOTE — Progress Notes (Signed)
NAME:  Joshua Donovan, MRN:  [id number], DOB:  09/08/1955, LOS: 20 ADMISSION DATE:  08/03/2021, CONSULTATION DATE:  7/26 REFERRING MD:  Colvin Caroli FOR CONSULT:  CVA   History of Present Illness:  66 y/o male presented to [facility] on 7/24 with R hand numbness and weakness, found to have small brainstem strok in ventral medulla.  Symptoms worsened and required transfer to [facility] for neuro IR intervention where he had a stent placed in R vertebrovasilar junction.  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  He had a tracheostomy performed on 7/26.    Pertinent  Medical History  TBI with ICH, DM, HTN, HLD  Significant Hospital Events: Including procedures, antibiotic start and stop dates in addition to other pertinent events   7/24 presented to [facility], ventral medulla CVA 7/25 tx to [facility] 7/26 cerebral angiogram with stent placement to right vertebro-basilar junction, failed extubation, left radial aline MRI brain> mild extension of stroke, now involving bilateral medial medullary, patent basilar artery and R VBJ stent  7/28 underwent bedside percutaneous tracheostomy and PEG tube placement, tolerated well 7/29 no acute issues overnight currently on SBT trial this a.m. and tolerating well, Cleviprex resumed earlier this a.m. due to severe hypertension 7/30 Hypertension persist despite adding oral agents, glucose remains elevated as well 7/31 Bleeding around trach site, bright red blood. Copious bloody secretions. Cleviprex off, SBPs 150s-160s. Basal insulin/TF coverage increased. Cefepime deescalated to ceftriaxone. CXR stable. Later in afternoon, continued peritrach bleeding. Bronchoscopic eval completed with intratracheal bleeding associated with trach. Removed, patient orally reintubated, stoma packed. 8/1 ENT consulted for trach revision. Lightly sedated. Vent full support/PRVC, given fatigue following events of yesterday. ENT attempted to evaluate trach at bedside, could not pass  scope. Plan for OR 8/2. ASA/Brilinta held. 8/2 to OR for trach redo 8/6 trach collar during day shift, back on vent overnight  Interim History / Subjective:  Had some hemoptysis / blood w suctioning 8/10, improved today Suctioning about every 4 -6 h, although he is able to clear most of his mucous out of trach    Objective   Blood pressure (!) 162/87, pulse 88, temperature 99.2 F (37.3 C), temperature source Oral, resp. rate (!) 32, height '5\' 7"'$  (1.702 m), weight 84.1 kg, SpO2 93 %.    FiO2 (%):  [28 %] 28 %   Intake/Output Summary (Last 24 hours) at 08/20/2021 1145 Last data filed at 08/19/2021 2316 Gross per 24 hour  Intake --  Output 1100 ml  Net -1100 ml   Filed Weights   08/18/21 0500 08/19/21 0500 08/20/21 0500  Weight: 84.2 kg 84.7 kg 84.1 kg    Examination:  General:  comfortable, ill appearing man HENT: trach site CDI, some green secretions, no blood PULM: decreased B bases, no wheeze CV: regular, no M GI: nondistended, + BS MSK: normal Neuro: awake, tries to mouth world, not really following commands, moves [facility] Problem list   Enterobacter HCAP  Assessment & Plan:  Acute hypoxemic respiratory failure in setting of stroke Tracheostomy status > bleeding from initial tracheostomy, s/p redo tracheostomy 8/2 by ENT -following on ATC ad lib -continue standard trach care -he does not look like a decanulation candidate at this time due to strength, secretion management -try to minimize suctioning as able to avoid bleeding  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 to R vertebro-basilar junction Hypertension> difficult to control -continue ASA, brilinta -continue Statin -SBP  goal < 140 -amlodipine, HCTZ, clonidine, coreg  DM2 with hyperglycemia -Continue levemir 30 q12h -continue SSI and tube feeding coverage  PCCM follow for tracheostomy management  Best Practice (right click and  "Reselect all SmartList Selections" daily)   Diet/type: tubefeeds DVT prophylaxis: prophylactic heparin  GI prophylaxis: PPI Lines: N/A Foley:  N/A Code Status:  full code Last date of multidisciplinary goals of care discussion [8/1]  Critical care time: n/a   Baltazar Apo, MD, PhD 08/20/2021, 11:50 AM [facility] [phone] or if no answer before 7:00PM call [phone] For any issues after 7:00PM please call eLink [phone]

## 2021-08-20 NOTE — Progress Notes (Signed)
Speech Language Pathology Treatment: [name] Speaking valve;Cognitive-Linquistic  Patient Details Name: Joshua Donovan MRN: [id number] DOB: 1955-05-14 Today's Date: 08/20/2021 Time: 1400-1430 SLP Time Calculation (min) (ACUTE ONLY): 30 min  Assessment / Plan / Recommendation Clinical Impression  Pt seen up in chair with two friends visiting. Pt consented to working with me with friends present. Attempted PMSV placement. Pt able to wear valve for about 20-30 seconds. If given cues to push he can move some air though upper airway with a slight sound that may be some voicing. He is unable to move a sufficient quantity of air past the trach to tolerate longer placement.  SLP introduced a series of wants/needs questions for auditory scanning. Pt able to respond yes to being cold and yes to needing to be cleaned up. When asked if he needed to call anyone, he looked me up and down and mouthed the words "I need your number" which delighted his friends and himself with lots of smiles. Pt still with good humor.  Updated pt about ongoing search for eye gaze device. WIll continue efforts new week.   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 He  presented 08/02/21 with R foot pain and R-sided weakness. Transferred to [facility] 7/25. 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 S/p stent placement to the R vertebrobasilar junction 7/26, failed extubation. Trach and PEG placed 7/28. PMH: DM, GERD, TBI with prior ICH, HLD, HTN      SLP Plan  Continue with current plan of care      Recommendations for follow up therapy are one component of  a multi-disciplinary discharge planning process, led by the attending physician.  Recommendations may be updated based on patient status, additional functional criteria and insurance authorization.    Recommendations         Patient may use Passy-Muir Speech Valve: with SLP only         Oral Care Recommendations: Oral care BID Plan: Continue with current plan of care           Joshua Donovan, [name]  08/20/2021, 2:58 PM

## 2021-08-20 NOTE — Progress Notes (Signed)
Physical Therapy Treatment Patient Details Name: Joshua Donovan MRN: [id number] DOB: 10/22/1955 Today's Date: 08/20/2021   History of Present Illness Pt is a 66 y.o. male who presented 08/02/21 with R foot pain and R-sided weakness. Transferred to [facility] 7/25. 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 S/p stent placement to the R vertebrobasilar junction 7/26, failed extubation. Trach and PEG placed 7/28. S/p bronchoscopic evaluation, oral reintubation and tracheostomy removal with stoma packing 7/31 due to trach site bleeding. S/p trach revision 8/2. PMH: DM, GERD, TBI with prior ICH, HLD, HTN    PT Comments    Patient progressing slowly towards goals.  Seems very eager and attempting to communicate mouthing words throughout session.  Still without active movement in extremities but possibly trace assistance during head rotation activities today.  Feel he will continue to benefit from skilled PT in the acute setting.  Appropriate for LTACH versus SNF at d/c.  Recommendations for follow up therapy are one component of a multi-disciplinary discharge planning process, led by the attending physician.  Recommendations may be updated based on patient status, additional functional criteria and insurance authorization.  Follow Up Recommendations  Skilled nursing-short term rehab (<3 hours/day) (vs LTACH) Can patient physically be transported by private vehicle: No   Assistance Recommended at Discharge Frequent or constant Supervision/Assistance  Patient can return home with the following Assistance with cooking/housework;Two people to help  with walking and/or transfers;Two people to help with bathing/dressing/bathroom;Assistance with feeding;Direct supervision/assist for medications management;Direct supervision/assist for financial management;Assist for transportation;Help with stairs or ramp for entrance   Equipment Recommendations  Other (comment) (TBA)    Recommendations for Other Services       Precautions / Restrictions Precautions Precautions: Fall Precaution Comments: trach collar, peg, SBP < 180, prevalon boots, copious trach secretions     Mobility  Bed Mobility   Bed Mobility: Rolling Rolling: Total assist, +2 for physical assistance, +2 for safety/equipment              Transfers Overall transfer level: Needs assistance   Transfers: Bed to chair/wheelchair/BSC             General transfer comment: up to chair via lift; increased time for positioning for good head support and for safety Transfer via Lift Equipment: Maximove  Ambulation/Gait                   Stairs             Wheelchair Mobility    Modified Rankin (Stroke Patients Only) Modified Rankin (Stroke Patients Only) Pre-Morbid Rankin Score: No symptoms Modified Rankin: Severe disability     Balance Overall balance assessment: Needs assistance   Sitting balance-Leahy Scale: Zero Sitting balance - Comments: dependent on therapist for trunk and head support                                    Cognition Arousal/Alertness: Awake/alert Behavior During Therapy: WFL for tasks assessed/performed Overall Cognitive Status: Difficult to assess  General Comments: mouths throughout session, not yet cleared for PMSV with copious secretions        Exercises General Exercises - Upper Extremity Shoulder Flexion: PROM, Both, 10 reps, Supine Shoulder Horizontal ABduction: PROM, Both, 10 reps, Supine Shoulder Horizontal ADduction: PROM, Both, 10 reps,  Supine Digit Composite Flexion: PROM, Both, 10 reps, Supine Composite Extension: PROM, Both, 10 reps, Supine General Exercises - Lower Extremity Ankle Circles/Pumps: PROM, Both, 10 reps, Supine Heel Slides: PROM, Both, 10 reps, Supine Hip ABduction/ADduction: PROM, Right, 10 reps, Supine Other Exercises Other Exercises: PROM Cervical tilts and rotations both sides x5    General Comments General comments (skin integrity, edema, etc.): VSS on trach collar; placed second yonker in room for oral secretions      Pertinent Vitals/Pain Pain Assessment Pain Assessment: No/denies pain    Home Living                          Prior Function            PT Goals (current goals can now be found in the care plan section) Acute Rehab PT Goals Patient Stated Goal: unable to state PT Goal Formulation: Patient unable to participate in goal setting Time For Goal Achievement: 09/03/21 Potential to Achieve Goals: Fair Additional Goals Additional Goal #1: Patient to demonstrate head control in sitting for 10 seconds prior to needing assist or cues. Progress towards PT goals: Progressing toward goals (goals updated this session)    Frequency    Min 3X/week      PT Plan Current plan remains appropriate    Co-evaluation              AM-PAC PT "6 Clicks" Mobility   Outcome Measure  Help needed turning from your back to your side while in a flat bed without using bedrails?: Total Help needed moving from lying on your back to sitting on the side of a flat bed without using bedrails?: Total Help needed moving to and from a bed to a chair (including a wheelchair)?: Total Help needed standing up from a chair using your arms (e.g., wheelchair or bedside chair)?: Total Help needed to walk in hospital room?: Total Help needed climbing 3-5 steps with a railing? : Total 6 Click Score: 6    End of Session Equipment Utilized During Treatment: Oxygen Activity Tolerance: Patient  tolerated treatment well Patient left: in chair;with call bell/phone within reach;with chair alarm set;with family/visitor present Nurse Communication: Need for lift equipment;Mobility status PT Visit Diagnosis: Muscle weakness (generalized) (M62.81);Difficulty in walking, not elsewhere classified (R26.2);Other symptoms and signs involving the nervous system (R29.898);Unsteadiness on feet (R26.81)     Time: 1779-3903 PT Time Calculation (min) (ACUTE ONLY): 33 min  Charges:  $Therapeutic Activity: 23-37 mins                     [name], PT Acute Rehabilitation Services Office:[phone] 08/20/2021    [name] 08/20/2021, 4:41 PM

## 2021-08-20 NOTE — Progress Notes (Signed)
PROGRESS NOTE    Joshua Donovan  GMW:[id number] DOB: [birth year] DOA: 08/03/2021 PCP: [facility] [place]    Brief Narrative:  Patient with history of intracranial hemorrhage, type 2 diabetes, hypertension hyperlipidemia presented to [facility] on 7/24 with right hand numbness and weakness, initially found to have a small brainstem stroke and ventral medulla.  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  He was intubated and transferred to [facility] for interventional radiology intervention.  Remained in the intensive care unit.  Now with tracheostomy and PEG tube placement.  Pending LTAC transfer. 7/24, admitted to [facility] and then subsequent transfer to [facility]. 7/28, tracheostomy and PEG tube placement 8/6, trach collar during day shift, back on vent overnight. 8/9, transferred to medical floor on tracheostomy now. Largely stable, occasional bleeding through the tracheostomy, suctioned.   Assessment & Plan:   Acute hypoxemic respiratory failure in the setting of brainstem stroke: Currently on trach collar.  Pulmonology following.  Tracheostomy care.  Monitor for further bleeding.  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  Seen by neurology. Currently on aspirin and Brilinta, continue dual antiplatelet therapy due to intracranial stenosis. Tolerating statin. Blood pressure stable on amlodipine, hydrochlorothiazide and clonidine.  Goal blood pressure less than 140.  Type 2 diabetes with hyperglycemia: Now with bolus tube feeding.  Blood sugars are greatly elevated.  On sliding scale insulin.  On Levemir 45 units twice daily, increased to 50 units twice daily.  Fairly stable today. NovoLog 15 units with bolus feeding.   Dysphagia secondary to stroke: Has a PEG tube.   N.p.o.  Ongoing PT OT and speech follow-up.  Nutrition following.   DVT prophylaxis: heparin injection 5,000 Units Start: 08/05/21 1400 SCD's Start: 08/03/21 2311   Code Status: Full code. Family Communication: None. Disposition Plan: Status is: Inpatient Remains inpatient appropriate because: Needs LTAC placement/long-term care with trach and PEG.     Consultants:  Critical care Neurointervention Neurology  Procedures:  Tracheostomy PEG tube placement  Antimicrobials:  None   Subjective: Patient seen and examined.  Episodic hemorrhagic secretions from the tracheostomy but now stable.  He responds with blinking eyes and he tells me he is doing fine.  Objective: Vitals:   08/20/21 0822 08/20/21 0856 08/20/21 1141 08/20/21 1149  BP: (!) 162/87  (!) 162/87   Pulse: 87  88   Resp: (!) 34 (!) 22 (!) 32 (!) 22  Temp:   99.2 F (37.3 C)   TempSrc:   Oral   SpO2: 94%  93%   Weight:      Height:        Intake/Output Summary (Last 24 hours) at 08/20/2021 1325 Last data filed at 08/20/2021 1245 Gross per 24 hour  Intake --  Output 1100 ml  Net -1100 ml   Filed Weights   08/18/21 0500 08/19/21 0500 08/20/21 0500  Weight: 84.2 kg 84.7 kg 84.1 kg    Examination:  General: Frail looking gentleman.  Not in any distress. Unable to move any extremities. Cardiovascular: S1-S2 normal.  Regular rate rhythm. Respiratory: Trach collar ,conducted upper airway sounds.  Not in any distress.  SpO2: 93 % (Simultaneous filing. User may not have seen previous data.) O2 Flow Rate (L/min): 5 L/min FiO2 (%): 28 %  Gastrointestinal: Soft.  Nontender.  Distended.  PEG tube in place. Ext: No deformities or swelling. Neuro: Alert awake.  Follows commands and voices but unable to move any extremities.    Data Reviewed: I have personally reviewed following labs and imaging studies  CBC: Recent Labs  Lab 08/17/21 0252 08/18/21 0238  WBC 11.1* 9.7  NEUTROABS 8.6* 7.4  HGB 12.7*  11.9*  HCT 38.8* 36.0*  MCV 89.0 87.4  PLT 429* 465   Basic Metabolic Panel: Recent Labs  Lab 08/14/21 0139 08/17/21 0252 08/18/21 0238  NA 140 141 137  K 3.7 3.3* 4.0  CL 104 104 99  CO2 '28 28 27  '$ GLUCOSE 229* 110* 370*  BUN '16 16 21  '$ CREATININE 0.78 0.73 0.76  CALCIUM 8.8* 9.1 8.9  MG 2.1 1.9 1.9   GFR: Estimated Creatinine Clearance: 94.2 mL/min (by C-G formula based on SCr of 0.76 mg/dL). Liver Function Tests: Recent Labs  Lab 08/17/21 0252 08/18/21 0238  AST 30 23  ALT 74* 58*  ALKPHOS 37* 40  BILITOT 0.5 0.4  PROT 6.5 6.2*  ALBUMIN 2.8* 2.6*   No results for input(s): "LIPASE", "AMYLASE" in the last 168 hours. No results for input(s): "AMMONIA" in the last 168 hours. Coagulation Profile: No results for input(s): "INR", "PROTIME" in the last 168 hours. Cardiac Enzymes: No results for input(s): "CKTOTAL", "CKMB", "CKMBINDEX", "TROPONINI" in the last 168 hours. BNP (last 3 results) No results for input(s): "PROBNP" in the last 8760 hours. HbA1C: No results for input(s): "HGBA1C" in the last 72 hours. CBG: Recent Labs  Lab 08/19/21 1205 08/19/21 1527 08/19/21 2108 08/20/21 0747 08/20/21 1139  GLUCAP 202* 98 289* 154* 293*   Lipid Profile: No results for input(s): "CHOL", "HDL", "LDLCALC", "TRIG", "CHOLHDL", "LDLDIRECT" in the last 72 hours. Thyroid Function Tests: No results for input(s): "TSH", "T4TOTAL", "FREET4", "T3FREE", "THYROIDAB" in the last 72 hours. Anemia Panel: No results for input(s): "VITAMINB12", "FOLATE", "FERRITIN", "TIBC", "IRON", "RETICCTPCT" in the last 72 hours. Sepsis Labs: No results for input(s): "PROCALCITON", "LATICACIDVEN" in the last 168 hours.  No results found for this or any previous visit (from the past 240 hour(s)).       Radiology Studies: No results found.      Scheduled Meds:  amLODipine  10 mg Per Tube Daily   aspirin  81 mg Per Tube Daily   atorvastatin  80 mg Per Tube Daily   carvedilol  25 mg  Per Tube BID WC   Chlorhexidine Gluconate Cloth  6 each Topical Q0600   cloNIDine  0.1 mg Per Tube TID   feeding supplement ([name])  474 mL Per Tube TID WC   feeding supplement (PROSource TF20)  60 mL Per Tube Daily   fiber  1 packet Per Tube BID   folic acid  1 mg Per Tube Daily   free water  100 mL Per Tube TID WC   heparin injection (subcutaneous)  5,000 Units Subcutaneous Q8H   hydrochlorothiazide  12.5 mg Per Tube Daily   insulin aspart  0-20 Units Subcutaneous TID WC   insulin aspart  0-5 Units Subcutaneous QHS   insulin aspart  18 Units Subcutaneous TID WC   insulin detemir  50 Units Subcutaneous BID   loratadine  10 mg Per Tube Daily   multivitamin with minerals  1 tablet Per Tube Daily   mouth rinse  15 mL Mouth Rinse Q2H   thiamine  100 mg Per Tube Daily   ticagrelor  90 mg Per Tube BID   Continuous Infusions:   LOS: 17 days    Time spent: 35  minutes    Barb Merino, MD [facility] Pager [phone]

## 2021-08-21 LAB — GLUCOSE, CAPILLARY
Glucose-Capillary: 70 mg/dL (ref 70–99)
Glucose-Capillary: 336 mg/dL — ABNORMAL HIGH (ref 70–99)
Glucose-Capillary: 175 mg/dL — ABNORMAL HIGH (ref 70–99)
Glucose-Capillary: 252 mg/dL — ABNORMAL HIGH (ref 70–99)

## 2021-08-21 MED ORDER — ONDANSETRON HCL 4 MG/2ML IJ SOLN
4.0000 mg | Freq: Four times a day (QID) | INTRAMUSCULAR | Status: AC | PRN
  Administered 2021-08-21: 4 mg via INTRAVENOUS

## 2021-08-21 NOTE — Progress Notes (Signed)
PROGRESS NOTE    Joshua Donovan  JEH:[id number] DOB: 03-Nov-1955 DOA: 08/03/2021 PCP: [facility] [place]    Brief Narrative:  Patient with history of intracranial hemorrhage, type 2 diabetes, hypertension hyperlipidemia presented to [facility] on 7/24 with right hand numbness and weakness, initially found to have a small brainstem stroke and ventral medulla.  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  He was intubated and transferred to [facility] for interventional radiology intervention.  Remained in the intensive care unit.  Now with tracheostomy and PEG tube placement.  Pending LTAC transfer. 7/24, admitted to [facility] and then subsequent transfer to [facility]. 7/28, tracheostomy and PEG tube placement 8/6, trach collar during day shift, back on vent overnight. 8/9, transferred to medical floor on tracheostomy now. Largely stable, occasional bleeding through the tracheostomy, suctioned.   Assessment & Plan:   Acute hypoxemic respiratory failure in the setting of brainstem stroke: Currently on trach collar.  Pulmonology following.  Tracheostomy care.  Monitor for further bleeding.  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  Seen by neurology. Currently on aspirin and Brilinta, continue dual antiplatelet therapy due to intracranial stenosis. Tolerating statin. Blood pressure stable on amlodipine, hydrochlorothiazide and clonidine.  Goal blood pressure less than 140.  Type 2 diabetes with hyperglycemia: Now with bolus tube feeding.  Blood sugars are greatly elevated.  On sliding scale insulin.  On Levemir 45 units twice daily, increased to 50 units twice daily.  Fairly stable today. NovoLog 15 units with bolus feeding.   Dysphagia secondary to stroke: Has a PEG tube.   N.p.o.  Ongoing PT OT and speech follow-up.  Nutrition following. Intermittently vomits formula, will ask nutrition to review    DVT prophylaxis: heparin injection 5,000 Units Start: 08/05/21 1400 SCD's Start: 08/03/21 2311   Code Status: Full code. Family Communication: None. Disposition Plan: Status is: Inpatient Remains inpatient appropriate because: Needs LTAC placement/long-term care with trach and PEG.     Consultants:  Critical care Neurointervention Neurology  Procedures:  Tracheostomy PEG tube placement  Antimicrobials:  None   Subjective: Patient seen and examined.  No overnight events.  Nursing reported that he has intolerance with bolus feeding and occasionally vomits.  This may be normal for him.  Will discuss with nutrition. Still has some bloody mucoid sputum production but better than earlier.  Objective: Vitals:   08/21/21 0820 08/21/21 1127 08/21/21 1200 08/21/21 1211  BP: (!) 160/89  131/74   Pulse: 82 88 83   Resp: 19 (!) 22 (!) 37 (!) 24  Temp:   99.1 F (37.3 C)   TempSrc:   Oral   SpO2: 95% 93% 91%   Weight:      Height:        Intake/Output Summary (Last 24 hours) at 08/21/2021 1341 Last data filed at 08/20/2021 1817 Gross per 24 hour  Intake --  Output 500 ml  Net -500 ml   Filed Weights   08/18/21 0500 08/19/21 0500 08/20/21 0500  Weight: 84.2 kg 84.7 kg 84.1 kg    Examination:  General: Frail looking gentleman.  Not in any distress. Unable to move any extremities. Cardiovascular: S1-S2 normal.  Regular rate rhythm. Respiratory: Trach collar , mucoid secretions on the tracheostomy with some old blood.  Conducted upper airway sounds.  Not in any distress.  SpO2: 91 % O2 Flow Rate (L/min): 5  L/min FiO2 (%): 28 %  Gastrointestinal: Soft.  Nontender.  Distended.  PEG tube in place. Ext: No deformities or swelling. Neuro: Alert awake.  Follows commands and voices but unable to move any extremities.    Data Reviewed: I have  personally reviewed following labs and imaging studies  CBC: Recent Labs  Lab 08/17/21 0252 08/18/21 0238  WBC 11.1* 9.7  NEUTROABS 8.6* 7.4  HGB 12.7* 11.9*  HCT 38.8* 36.0*  MCV 89.0 87.4  PLT 429* 109   Basic Metabolic Panel: Recent Labs  Lab 08/17/21 0252 08/18/21 0238  NA 141 137  K 3.3* 4.0  CL 104 99  CO2 28 27  GLUCOSE 110* 370*  BUN 16 21  CREATININE 0.73 0.76  CALCIUM 9.1 8.9  MG 1.9 1.9   GFR: Estimated Creatinine Clearance: 94.2 mL/min (by C-G formula based on SCr of 0.76 mg/dL). Liver Function Tests: Recent Labs  Lab 08/17/21 0252 08/18/21 0238  AST 30 23  ALT 74* 58*  ALKPHOS 37* 40  BILITOT 0.5 0.4  PROT 6.5 6.2*  ALBUMIN 2.8* 2.6*   No results for input(s): "LIPASE", "AMYLASE" in the last 168 hours. No results for input(s): "AMMONIA" in the last 168 hours. Coagulation Profile: No results for input(s): "INR", "PROTIME" in the last 168 hours. Cardiac Enzymes: No results for input(s): "CKTOTAL", "CKMB", "CKMBINDEX", "TROPONINI" in the last 168 hours. BNP (last 3 results) No results for input(s): "PROBNP" in the last 8760 hours. HbA1C: No results for input(s): "HGBA1C" in the last 72 hours. CBG: Recent Labs  Lab 08/20/21 1139 08/20/21 1538 08/20/21 2045 08/21/21 0801 08/21/21 1126  GLUCAP 293* 260* 104* 70 336*   Lipid Profile: No results for input(s): "CHOL", "HDL", "LDLCALC", "TRIG", "CHOLHDL", "LDLDIRECT" in the last 72 hours. Thyroid Function Tests: No results for input(s): "TSH", "T4TOTAL", "FREET4", "T3FREE", "THYROIDAB" in the last 72 hours. Anemia Panel: No results for input(s): "VITAMINB12", "FOLATE", "FERRITIN", "TIBC", "IRON", "RETICCTPCT" in the last 72 hours. Sepsis Labs: No results for input(s): "PROCALCITON", "LATICACIDVEN" in the last 168 hours.  No results found for this or any previous visit (from the past 240 hour(s)).       Radiology Studies: No results found.      Scheduled Meds:  amLODipine  10 mg  Per Tube Daily   aspirin  81 mg Per Tube Daily   atorvastatin  80 mg Per Tube Daily   carvedilol  25 mg Per Tube BID WC   Chlorhexidine Gluconate Cloth  6 each Topical Q0600   cloNIDine  0.1 mg Per Tube TID   feeding supplement ([name])  474 mL Per Tube TID WC   feeding supplement (PROSource TF20)  60 mL Per Tube Daily   fiber  1 packet Per Tube BID   folic acid  1 mg Per Tube Daily   free water  100 mL Per Tube TID WC   heparin injection (subcutaneous)  5,000 Units Subcutaneous Q8H   hydrochlorothiazide  12.5 mg Per Tube Daily   insulin aspart  0-20 Units Subcutaneous TID WC   insulin aspart  0-5 Units Subcutaneous QHS   insulin aspart  18 Units Subcutaneous TID WC   insulin detemir  50 Units Subcutaneous BID   loratadine  10 mg Per Tube Daily   multivitamin with minerals  1 tablet Per Tube Daily   mouth rinse  15 mL Mouth Rinse Q2H   thiamine  100 mg Per Tube Daily   ticagrelor  90 mg Per Tube BID  Continuous Infusions:   LOS: 18 days    Time spent: 35 minutes    Barb Merino, MD [facility] Pager [phone]

## 2021-08-21 NOTE — Plan of Care (Signed)
Problem: Education: Goal: Knowledge of General Education information will improve Description: Including pain rating scale, medication(s)/side effects and non-pharmacologic comfort measures Outcome: Progressing   Problem: Health Behavior/Discharge Planning: Goal: Ability to manage health-related needs will improve Outcome: Progressing   Problem: Clinical Measurements: Goal: Ability to maintain clinical measurements within normal limits will improve Outcome: Progressing Goal: Will remain free from infection Outcome: Progressing Goal: Diagnostic test results will improve Outcome: Progressing Goal: Respiratory complications will improve Outcome: Progressing Goal: Cardiovascular complication will be avoided Outcome: Progressing   Problem: Activity: Goal: Risk for activity intolerance will decrease Outcome: Progressing   Problem: Nutrition: Goal: Adequate nutrition will be maintained Outcome: Progressing   Problem: Coping: Goal: Level of anxiety will decrease Outcome: Progressing   Problem: Elimination: Goal: Will not experience complications related to bowel motility Outcome: Progressing Goal: Will not experience complications related to urinary retention Outcome: Progressing   Problem: Pain Managment: Goal: General experience of comfort will improve Outcome: Progressing   Problem: Safety: Goal: Ability to remain free from injury will improve Outcome: Progressing   Problem: Skin Integrity: Goal: Risk for impaired skin integrity will decrease Outcome: Progressing   Problem: Education: Goal: Ability to describe self-care measures that may prevent or decrease complications (Diabetes Survival Skills Education) will improve Outcome: Progressing Goal: Individualized Educational Video(s) Outcome: Progressing   Problem: Coping: Goal: Ability to adjust to condition or change in health will improve Outcome: Progressing   Problem: Fluid Volume: Goal: Ability to  maintain a balanced intake and output will improve Outcome: Progressing   Problem: Health Behavior/Discharge Planning: Goal: Ability to identify and utilize available resources and services will improve Outcome: Progressing Goal: Ability to manage health-related needs will improve Outcome: Progressing   Problem: Metabolic: Goal: Ability to maintain appropriate glucose levels will improve Outcome: Progressing   Problem: Nutritional: Goal: Maintenance of adequate nutrition will improve Outcome: Progressing Goal: Progress toward achieving an optimal weight will improve Outcome: Progressing   Problem: Skin Integrity: Goal: Risk for impaired skin integrity will decrease Outcome: Progressing   Problem: Tissue Perfusion: Goal: Adequacy of tissue perfusion will improve Outcome: Progressing   Problem: Education: Goal: Knowledge of disease or condition will improve Outcome: Progressing Goal: Knowledge of secondary prevention will improve (SELECT ALL) Outcome: Progressing Goal: Knowledge of patient specific risk factors will improve (INDIVIDUALIZE FOR PATIENT) Outcome: Progressing Goal: Individualized Educational Video(s) Outcome: Progressing   Problem: Coping: Goal: Will verbalize positive feelings about self Outcome: Progressing Goal: Will identify appropriate support needs Outcome: Progressing   Problem: Health Behavior/Discharge Planning: Goal: Ability to manage health-related needs will improve Outcome: Progressing   Problem: Self-Care: Goal: Ability to participate in self-care as condition permits will improve Outcome: Progressing Goal: Verbalization of feelings and concerns over difficulty with self-care will improve Outcome: Progressing Goal: Ability to communicate needs accurately will improve Outcome: Progressing   Problem: Nutrition: Goal: Risk of aspiration will decrease Outcome: Progressing Goal: Dietary intake will improve Outcome: Progressing    Problem: Ischemic Stroke/TIA Tissue Perfusion: Goal: Complications of ischemic stroke/TIA will be minimized Outcome: Progressing   Problem: Education: Goal: Understanding of CV disease, CV risk reduction, and recovery process will improve Outcome: Progressing Goal: Individualized Educational Video(s) Outcome: Progressing   Problem: Activity: Goal: Ability to return to baseline activity level will improve Outcome: Progressing   Problem: Cardiovascular: Goal: Ability to achieve and maintain adequate cardiovascular perfusion will improve Outcome: Progressing Goal: Vascular access site(s) Level 0-1 will be maintained Outcome: Progressing   Problem: Health Behavior/Discharge Planning: Goal: Ability to safely manage   health-related needs after discharge will improve Outcome: Progressing   Problem: Activity: Goal: Ability to tolerate increased activity will improve Outcome: Progressing   

## 2021-08-21 NOTE — Progress Notes (Signed)
Patient did not tolerate his bolus feed. Pt was vomiting Tube feed via Trach. No tracheal aspiration noted per Respiratory. MD notified and nutritional consult ordered. Daughter stated that pt had intolerance to milk in the past.

## 2021-08-22 ENCOUNTER — Inpatient Hospital Stay (HOSPITAL_COMMUNITY): Payer: Medicare PPO

## 2021-08-22 LAB — CBC WITH DIFFERENTIAL/PLATELET
Abs Immature Granulocytes: 0.06 10*3/uL (ref 0.00–0.07)
Abs Immature Granulocytes: 0.04 10*3/uL (ref 0.00–0.07)
Basophils Absolute: 0.1 10*3/uL (ref 0.0–0.1)
Basophils Absolute: 0.1 10*3/uL (ref 0.0–0.1)
Basophils Relative: 1 %
Basophils Relative: 1 %
Eosinophils Absolute: 0.2 10*3/uL (ref 0.0–0.5)
Eosinophils Absolute: 0.2 10*3/uL (ref 0.0–0.5)
Eosinophils Relative: 2 %
Eosinophils Relative: 2 %
HCT: 42 % (ref 39.0–52.0)
HCT: 42.7 % (ref 39.0–52.0)
Hemoglobin: 13.4 g/dL (ref 13.0–17.0)
Hemoglobin: 13.7 g/dL (ref 13.0–17.0)
Immature Granulocytes: 1 %
Immature Granulocytes: 0 %
Lymphocytes Relative: 8 %
Lymphocytes Relative: 13 %
Lymphs Abs: 0.9 10*3/uL (ref 0.7–4.0)
Lymphs Abs: 1.4 10*3/uL (ref 0.7–4.0)
MCH: 28.6 pg (ref 26.0–34.0)
MCH: 28.7 pg (ref 26.0–34.0)
MCHC: 31.9 g/dL (ref 30.0–36.0)
MCHC: 32.1 g/dL (ref 30.0–36.0)
MCV: 89.7 fL (ref 80.0–100.0)
MCV: 89.3 fL (ref 80.0–100.0)
Monocytes Absolute: 0.9 10*3/uL (ref 0.1–1.0)
Monocytes Absolute: 1.1 10*3/uL — ABNORMAL HIGH (ref 0.1–1.0)
Monocytes Relative: 7 %
Monocytes Relative: 11 %
Neutro Abs: 10 10*3/uL — ABNORMAL HIGH (ref 1.7–7.7)
Neutro Abs: 7.9 10*3/uL — ABNORMAL HIGH (ref 1.7–7.7)
Neutrophils Relative %: 81 %
Neutrophils Relative %: 73 %
Platelets: 478 10*3/uL — ABNORMAL HIGH (ref 150–400)
Platelets: 559 10*3/uL — ABNORMAL HIGH (ref 150–400)
RBC: 4.68 MIL/uL (ref 4.22–5.81)
RBC: 4.78 MIL/uL (ref 4.22–5.81)
RDW: 13.7 % (ref 11.5–15.5)
RDW: 13.5 % (ref 11.5–15.5)
WBC: 12.2 10*3/uL — ABNORMAL HIGH (ref 4.0–10.5)
WBC: 10.6 10*3/uL — ABNORMAL HIGH (ref 4.0–10.5)
nRBC: 0 % (ref 0.0–0.2)
nRBC: 0 % (ref 0.0–0.2)

## 2021-08-22 LAB — ABO/RH: ABO/RH(D): O POS

## 2021-08-22 LAB — GLUCOSE, CAPILLARY
Glucose-Capillary: 192 mg/dL — ABNORMAL HIGH (ref 70–99)
Glucose-Capillary: 145 mg/dL — ABNORMAL HIGH (ref 70–99)
Glucose-Capillary: 47 mg/dL — ABNORMAL LOW (ref 70–99)
Glucose-Capillary: 47 mg/dL — ABNORMAL LOW (ref 70–99)
Glucose-Capillary: 168 mg/dL — ABNORMAL HIGH (ref 70–99)
Glucose-Capillary: 113 mg/dL — ABNORMAL HIGH (ref 70–99)
Glucose-Capillary: 122 mg/dL — ABNORMAL HIGH (ref 70–99)

## 2021-08-22 LAB — COMPREHENSIVE METABOLIC PANEL
ALT: 44 U/L (ref 0–44)
AST: 28 U/L (ref 15–41)
Albumin: 2.9 g/dL — ABNORMAL LOW (ref 3.5–5.0)
Alkaline Phosphatase: 43 U/L (ref 38–126)
Anion gap: 11 (ref 5–15)
BUN: 30 mg/dL — ABNORMAL HIGH (ref 8–23)
CO2: 28 mmol/L (ref 22–32)
Calcium: 9.2 mg/dL (ref 8.9–10.3)
Chloride: 103 mmol/L (ref 98–111)
Creatinine, Ser: 0.89 mg/dL (ref 0.61–1.24)
GFR, Estimated: >60 mL/min (ref >60)
Glucose, Bld: 223 mg/dL — ABNORMAL HIGH (ref 70–99)
Potassium: 3.5 mmol/L (ref 3.5–5.1)
Sodium: 142 mmol/L (ref 135–145)
Total Bilirubin: 0.5 mg/dL (ref 0.3–1.2)
Total Protein: 7.1 g/dL (ref 6.5–8.1)

## 2021-08-22 LAB — TYPE AND SCREEN
ABO/RH(D): O POS
Antibody Screen: NEGATIVE

## 2021-08-22 LAB — MAGNESIUM: Magnesium: 2.1 mg/dL (ref 1.7–2.4)

## 2021-08-22 LAB — PHOSPHORUS: Phosphorus: 4.4 mg/dL (ref 2.5–4.6)

## 2021-08-22 LAB — PROTIME-INR
INR: 1.1 (ref 0.8–1.2)
Prothrombin Time: 14.1 seconds (ref 11.4–15.2)

## 2021-08-22 LAB — APTT: aPTT: 34 seconds (ref 24–36)

## 2021-08-22 MED ORDER — FREE WATER
150.0000 mL | Status: DC
  Administered 2021-08-22 – 2021-09-07 (×97): 150 mL

## 2021-08-22 MED ORDER — JEVITY 1.5 CAL/FIBER PO LIQD
1000.0000 mL | ORAL | Status: DC
  Administered 2021-08-22 – 2021-10-10 (×37): 1000 mL
  Filled 2021-08-22 (×74): qty 1000

## 2021-08-22 MED ORDER — DEXTROSE 50 % IV SOLN
1.0000 | Freq: Once | INTRAVENOUS | Status: AC
  Administered 2021-08-22: 50 mL via INTRAVENOUS
  Filled 2021-08-22: qty 50

## 2021-08-22 MED ORDER — INSULIN DETEMIR 100 UNIT/ML ~~LOC~~ SOLN
50.0000 [IU] | Freq: Every day | SUBCUTANEOUS | Status: DC
  Administered 2021-08-23: 50 [IU] via SUBCUTANEOUS
  Filled 2021-08-22 (×2): qty 0.5

## 2021-08-22 MED ORDER — DEXTROSE 10 % IV SOLN
INTRAVENOUS | Status: DC
Start: 2021-08-22 — End: 2021-08-23

## 2021-08-22 MED ORDER — INSULIN ASPART 100 UNIT/ML IJ SOLN
0.0000 [IU] | INTRAMUSCULAR | Status: DC
  Administered 2021-08-22: 2 [IU] via SUBCUTANEOUS
  Administered 2021-08-23: 3 [IU] via SUBCUTANEOUS
  Administered 2021-08-23 (×2): 5 [IU] via SUBCUTANEOUS
  Administered 2021-08-23: 8 [IU] via SUBCUTANEOUS
  Administered 2021-08-23: 3 [IU] via SUBCUTANEOUS
  Administered 2021-08-23: 5 [IU] via SUBCUTANEOUS
  Administered 2021-08-24 (×2): 3 [IU] via SUBCUTANEOUS
  Administered 2021-08-24: 5 [IU] via SUBCUTANEOUS
  Administered 2021-08-24 (×2): 3 [IU] via SUBCUTANEOUS
  Administered 2021-08-24: 2 [IU] via SUBCUTANEOUS
  Administered 2021-08-25: 3 [IU] via SUBCUTANEOUS
  Administered 2021-08-25: 5 [IU] via SUBCUTANEOUS
  Administered 2021-08-25 (×2): 3 [IU] via SUBCUTANEOUS
  Administered 2021-08-25: 5 [IU] via SUBCUTANEOUS
  Administered 2021-08-26: 3 [IU] via SUBCUTANEOUS
  Administered 2021-08-26: 5 [IU] via SUBCUTANEOUS
  Administered 2021-08-26 (×2): 3 [IU] via SUBCUTANEOUS

## 2021-08-22 NOTE — Progress Notes (Signed)
Brief Nutrition Note:  Consult received to reassess TF. Pt not tolerating bolus feedings.  Per MD note, pt intermittently vomits TF  Last BM 8/11, +flatus  Current order is for 2 cartons of Jevity 1.5 TID plus 100 mL of free water (50 mL before and after bolus): total volume 574 mL  It may be that pt can tolerate boluses but needs a smaller volume. Also need to check with nursing to see that boluses are administered appropriately. HOB >30 during bolus feeding and for at least 45 minutes after, bolus should be administered via gravity, not pushed/forced.   It also looks like patient was receiving the bolus feeding, flush before and after and additional flush all around the same time in AM, followed by med administration. It may be that total volume was just too much.  For now, plan to transition back to continuous feedings of the same formula. If he does not tolerate continuous feedings, then it may be that changing the formula would be appropriate or may need to evaluate abdomen Once he tolerates continuous feedings, can trial transitioning back to bolus feedings, maybe less volume at a time, more frequently.   Interventions:  -Transition back to continuous feedings: Jevity 1.5 at 20 ml/hr, titrate by 10 mL q 6 hours until goal rate of 60 ml/hr  Pro-Source TF20 60 mL daily This provides 2240 kcals, 117 g of protein,  -Change free water to flush to 150 mL q 4 hours; provides total of 2294 mL -All TF formulas on hospital formulary are suitable for lactose intolerance   Kerman Passey MS, RDN, LDN, CNSC Registered Dietitian 3 Clinical Nutrition RD Pager and On-Call Pager Number Located in [place]

## 2021-08-22 NOTE — Hospital Course (Addendum)
Patient with history of ICH, type II DM, HTN, HLD presented to [facility] hospital on 7/24 with right hand numbness and weakness, initially found to have a small brainstem stroke and ventral medulla.  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  He was intubated and transferred to [facility] for interventional radiology intervention.  Remained in the intensive care unit.  Now with tracheostomy and PEG tube placement.  Pending LTAC transfer.  7/24 presented to [facility], ventral medulla CVA 7/25 tx to [facility], treated with Cleviprex. 7/26 cerebral angiogram with stent placement to right vertebro-basilar junction, failed extubation, MRI brain> mild extension of stroke, now involving bilateral medial medullary, patent basilar artery and R VBJ stent  7/28 bedside tracheostomy by PCCM and PEG tube placement by general surgery 7/30 Hypertension persist despite adding oral agents, glucose remains elevated as well 7/31 Bleeding around trach site, bright red blood.  Underwent bronchoscopy, Trach Removed, patient orally reintubated, stoma packed. 8/2 ENT took patient to OR for trach redo 8/9 transferred to medical floor on tracheostomy now. 8/12 vomiting after bolus feeding with worsening hypoxia. 8/13 copious bleeding from the heparin injection site controlled with pressure dressing. 8/14 bleeding has resolved.  Trach secretions blood-tinged. 8/15 insurance denied LTAC appeal.  CSW working on SNF. 8/16>8/22: 25 beat run of NSVT on 8/21 8/28: peer to peer for LTAC looks like this was denied- Interval x-ray showed basilar opacities--respiratory culture ordered, hypertonic saline started scopolamine discontinued 8/29: V. tach runs-increased metoprolol Resolved 9/2: Slightly increased work of breathing-low-grade temps 99 for chest x-ray showed no interval changes 9/5:  Ancef started 2/2 secretions --no wbc or fever

## 2021-08-22 NOTE — Progress Notes (Signed)
Progress Note Patient: Joshua Donovan HCW:[id number] DOB: 1955/03/10 DOA: 08/03/2021  DOS: the patient was seen and examined on 08/22/2021  Brief hospital course: Patient with history of ICH, type II DM, HTN, HLD presented to [facility] regional hospital on 7/24 with right hand numbness and weakness, initially found to have a small brainstem stroke and ventral medulla.  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  He was intubated and transferred to [facility] for interventional radiology intervention.  Remained in the intensive care unit.  Now with tracheostomy and PEG tube placement.  Pending LTAC transfer.  7/24 presented to [facility], ventral medulla CVA 7/25 tx to [facility], treated with Cleviprex. 7/26 cerebral angiogram with stent placement to right vertebro-basilar junction, failed extubation, MRI brain> mild extension of stroke, now involving bilateral medial medullary, patent basilar artery and R VBJ stent  7/28 bedside tracheostomy by PCCM and PEG tube placement by general surgery 7/30 Hypertension persist despite adding oral agents, glucose remains elevated as well 7/31 Bleeding around trach site, bright red blood.  Underwent bronchoscopy, Trach Removed, patient orally reintubated, stoma packed. 8/2 ENT took patient to OR for trach redo 8/9 transferred to medical floor on tracheostomy now. 8/12 vomiting after bolus feeding with worsening hypoxia. 8/13 copious bleeding from the heparin injection site controlled with pressure dressing.  Assessment and Plan: Acute hypoxic respiratory failure. SP tracheostomy. Vomiting after bolus feeding. Possible aspiration pneumonitis. Required intubation after admission due to worsening mentation. Failed extubation. Required tracheostomy tube by PCCM. Developed bleeding around the tracheostomy tube and therefore required redo tracheostomy by ENT. Was able to come down to 21% FiO2 on trach collar. Developed vomiting on 8/12  after bolus feeding and now currently on 40% FiO2. Chest x-ray shows no pneumonia or any other inflammatory condition. For now we will monitor.  Prolonged dysphagia. S/P PEG tube placement. Presents with stroke and unable to swallow safely due to need for ventilatory assistance. Underwent PEG tube placement by general surgery. Patient was transitioned to bolus feeds although unsure the reason. Patient currently unable to tolerated very well and therefore on continuous feed.  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 On Brilinta. Patient was loaded with aspirin and Brilinta in [facility]. Underwent stent placement by neuroradiology here at [facility]. Patient has multiple episodes of bleeding which is concerning.  Will discuss with neurology to see if Brilinta can be downgraded to Plavix.  Hypertensive emergency. Grade 1 diastolic dysfunction. Blood pressure was severely elevated at the time of admission. Patient was requiring Drip Currently blood pressure stable. Monitor  HLD. LDL 116.  Continue statin.  Type 2 diabetes mellitus, uncontrolled with hyperglycemia with HLD. Hemoglobin A1c 8.3. Uncontrolled. Blood sugars are also elevated and currently uncontrolled. With both hyper and hypoglycemia. Insulin regimen adjusted for now. Consider addition of [place] tomorrow. Monitor  History of alcohol use. 1 beer per day. Patient was on CIWA protocol.  Currently not in the withdrawal window.  Bleeding from the tracheostomy tube. Bleeding from the heparin injection site. Continue pressure dressing. Hemoglobin stable. PT/PTT stable. We will discuss with neurology to see if Brilinta can be downgraded to Plavix.  Subjective: No acute complaint no nausea no vomiting.  Had an episode of bleeding from the heparin injection site.  Blood sugars were also low in the afternoon.  Physical Exam: Vitals:   08/22/21 1137  08/22/21 1140 08/22/21 1456 08/22/21 1513  BP: 123/76 123/76 128/75 128/75  Pulse: 85 86  81 84  Resp: (!) 33 (!) 32 (!) 32 (!) 29  Temp: 98.4 F (36.9 C)  98.6 F (37 C)   TempSrc: Axillary  Axillary   SpO2: 92% 92% 91% 92%  Weight:      Height:       General: Appear in mild distress; no visible Abnormal Neck Mass Or lumps, Conjunctiva normal Cardiovascular: S1 and S2 Present, no Murmur, Respiratory: increased respiratory effort, Bilateral Air entry present and  bilateral Crackles, no wheezes Abdomen: Bowel Sound present, Non tender  Extremities: no Pedal edema Neurology: alert and non verbal unable to move any extremities. Gait not checked due to patient safety concerns   Data Reviewed: I have Reviewed nursing notes, Vitals, and Lab results since pt's last encounter. Pertinent lab results CBC and BMP I have ordered test including CBC, BMP I have ordered imaging studies chest x-ray, x-ray abdomen. I have independently visualized and interpreted imaging x-ray abdomen which showed no severe constipation.   Family Communication: Niece at bedside  Disposition: Status is: Inpatient Remains inpatient appropriate because: Currently trach and PEG dependent.  Need LTAC.  Author: Berle Mull, MD 08/22/2021 7:03 PM  Please look on [email or web address] to find out who is on call.

## 2021-08-22 NOTE — Progress Notes (Signed)
Called to pt's room by RT with reports of bleeding from abd.  Large amount of blood present with large clots where Heparin injection given earlier.  Dr. Posey Pronto informed and came to bedside to assess.

## 2021-08-23 DIAGNOSIS — Z430 Encounter for attention to tracheostomy: Secondary | ICD-10-CM

## 2021-08-23 LAB — CBC WITH DIFFERENTIAL/PLATELET
Abs Immature Granulocytes: 0.04 10*3/uL (ref 0.00–0.07)
Basophils Absolute: 0.1 10*3/uL (ref 0.0–0.1)
Basophils Relative: 1 %
Eosinophils Absolute: 0.2 10*3/uL (ref 0.0–0.5)
Eosinophils Relative: 2 %
HCT: 40.5 % (ref 39.0–52.0)
Hemoglobin: 13 g/dL (ref 13.0–17.0)
Immature Granulocytes: 0 %
Lymphocytes Relative: 12 %
Lymphs Abs: 1.1 10*3/uL (ref 0.7–4.0)
MCH: 28.5 pg (ref 26.0–34.0)
MCHC: 32.1 g/dL (ref 30.0–36.0)
MCV: 88.8 fL (ref 80.0–100.0)
Monocytes Absolute: 1 10*3/uL (ref 0.1–1.0)
Monocytes Relative: 11 %
Neutro Abs: 6.5 10*3/uL (ref 1.7–7.7)
Neutrophils Relative %: 74 %
Platelets: 446 10*3/uL — ABNORMAL HIGH (ref 150–400)
RBC: 4.56 MIL/uL (ref 4.22–5.81)
RDW: 13.4 % (ref 11.5–15.5)
WBC: 8.9 10*3/uL (ref 4.0–10.5)
nRBC: 0 % (ref 0.0–0.2)

## 2021-08-23 LAB — BASIC METABOLIC PANEL
Anion gap: 8 (ref 5–15)
BUN: 24 mg/dL — ABNORMAL HIGH (ref 8–23)
CO2: 31 mmol/L (ref 22–32)
Calcium: 9.1 mg/dL (ref 8.9–10.3)
Chloride: 102 mmol/L (ref 98–111)
Creatinine, Ser: 0.79 mg/dL (ref 0.61–1.24)
GFR, Estimated: >60 mL/min (ref >60)
Glucose, Bld: 268 mg/dL — ABNORMAL HIGH (ref 70–99)
Potassium: 3.6 mmol/L (ref 3.5–5.1)
Sodium: 141 mmol/L (ref 135–145)

## 2021-08-23 LAB — GLUCOSE, CAPILLARY
Glucose-Capillary: 166 mg/dL — ABNORMAL HIGH (ref 70–99)
Glucose-Capillary: 172 mg/dL — ABNORMAL HIGH (ref 70–99)
Glucose-Capillary: 208 mg/dL — ABNORMAL HIGH (ref 70–99)
Glucose-Capillary: 208 mg/dL — ABNORMAL HIGH (ref 70–99)
Glucose-Capillary: 224 mg/dL — ABNORMAL HIGH (ref 70–99)
Glucose-Capillary: 287 mg/dL — ABNORMAL HIGH (ref 70–99)

## 2021-08-23 LAB — MAGNESIUM: Magnesium: 1.9 mg/dL (ref 1.7–2.4)

## 2021-08-23 MED ORDER — INSULIN ASPART 100 UNIT/ML IJ SOLN
6.0000 [IU] | INTRAMUSCULAR | Status: DC
Start: 2021-08-23 — End: 2021-08-26
  Administered 2021-08-23 – 2021-08-26 (×18): 6 [IU] via SUBCUTANEOUS

## 2021-08-23 NOTE — Progress Notes (Signed)
Physical Therapy Treatment Patient Details Name: Joshua Donovan MRN: [id number] DOB: 05/30/1955 Today's Date: 08/23/2021   History of Present Illness Pt is a 66 y.o. male who presented 08/02/21 with R foot pain and R-sided weakness. Transferred to [facility] 7/25. 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 S/p stent placement to the R vertebrobasilar junction 7/26, failed extubation. Trach and PEG placed 7/28. S/p bronchoscopic evaluation, oral reintubation and tracheostomy removal with stoma packing 7/31 due to trach site bleeding. S/p trach revision 8/2. PMH: DM, GERD, TBI with prior ICH, HLD, HTN    PT Comments    Patient with tight pecs and biceps noted during ROM activities today.  Also grimacing with initial movement of hips, but improved with further ROM.  More lethargic today, but still communicating via eye and mouth movements.  Feel he remains appropriate for LTACH vs SNF.  PT will follow with hopes for some motor recovery.    Recommendations for follow up therapy are one component of a multi-disciplinary discharge planning process, led by the attending physician.  Recommendations may be updated based on patient status, additional functional criteria and insurance authorization.  Follow Up Recommendations  Skilled nursing-short term rehab (<3 hours/day) (vs. LTACH) Can patient physically be transported by private vehicle: No   Assistance Recommended at Discharge Frequent or constant Supervision/Assistance  Patient can return home with the following Assistance with cooking/housework;Two people to help with walking and/or  transfers;Two people to help with bathing/dressing/bathroom;Assistance with feeding;Direct supervision/assist for medications management;Direct supervision/assist for financial management;Assist for transportation;Help with stairs or ramp for entrance   Equipment Recommendations  Other (comment) (TBA)    Recommendations for Other Services       Precautions / Restrictions Precautions Precautions: Fall Precaution Comments: trach collar, peg, SBP < 180, prevalon boots, copious trach secretions     Mobility  Bed Mobility Overal bed mobility: Needs Assistance Bed Mobility: Rolling Rolling: Total assist, +2 for physical assistance, +2 for safety/equipment              Transfers Overall transfer level: Needs assistance   Transfers: Bed to chair/wheelchair/BSC             General transfer comment: up to chair via lift; increased time for positioning for good head support and for safety Transfer via Lift Equipment: Maximove  Ambulation/Gait                   Stairs             Wheelchair Mobility    Modified Rankin (Stroke Patients Only) Modified Rankin (Stroke Patients Only) Pre-Morbid Rankin Score: No symptoms Modified Rankin: Severe disability     Balance Overall balance assessment: Needs assistance   Sitting balance-Leahy Scale: Zero Sitting balance - Comments: extra time for positioning in chair for proper head support                                    Cognition Arousal/Alertness: Awake/alert Behavior During Therapy: WFL for tasks assessed/performed Overall Cognitive Status: Difficult to assess  Exercises General Exercises - Upper Extremity Shoulder Flexion: PROM, Both, 10 reps, Supine Shoulder ABduction: PROM, Both, 10 reps, Supine Shoulder Horizontal ABduction: PROM, Both, 10 reps, Supine Shoulder Horizontal ADduction: PROM, Both, 10 reps, Supine Elbow Flexion:  PROM, Both, 10 reps, Supine Elbow Extension: PROM, Both, 10 reps, Supine Wrist Flexion: PROM, Both, 10 reps, Supine Wrist Extension: PROM, Both, 10 reps, Supine Digit Composite Flexion: PROM, Both, 10 reps, Supine Composite Extension: PROM, Both, 10 reps, Supine General Exercises - Lower Extremity Ankle Circles/Pumps: PROM, Both, 10 reps, Supine Heel Slides: PROM, Both, 10 reps, Supine Hip ABduction/ADduction: PROM, Right, 10 reps, Supine Other Exercises Other Exercises: stretches to hamstrings, gluts and heel cords    General Comments General comments (skin integrity, edema, etc.): VSS on trach collar      Pertinent Vitals/Pain Pain Assessment Faces Pain Scale: Hurts a little bit Pain Location: grimacing with initial hip PROM Pain Descriptors / Indicators: Grimacing Pain Intervention(s): Monitored during session    Home Living                          Prior Function            PT Goals (current goals can now be found in the care plan section) Progress towards PT goals: Progressing toward goals    Frequency    Min 3X/week      PT Plan Current plan remains appropriate    Co-evaluation              AM-PAC PT "6 Clicks" Mobility   Outcome Measure  Help needed turning from your back to your side while in a flat bed without using bedrails?: Total Help needed moving from lying on your back to sitting on the side of a flat bed without using bedrails?: Total Help needed moving to and from a bed to a chair (including a wheelchair)?: Total Help needed standing up from a chair using your arms (e.g., wheelchair or bedside chair)?: Total Help needed to walk in hospital room?: Total Help needed climbing 3-5 steps with a railing? : Total 6 Click Score: 6    End of Session Equipment Utilized During Treatment: Oxygen Activity Tolerance: Patient tolerated treatment well Patient left: in chair;with call bell/phone within reach;with chair alarm set Nurse  Communication: Need for lift equipment;Mobility status PT Visit Diagnosis: Muscle weakness (generalized) (M62.81);Difficulty in walking, not elsewhere classified (R26.2);Other symptoms and signs involving the nervous system (R29.898);Unsteadiness on feet (R26.81)     Time: 2751-7001 PT Time Calculation (min) (ACUTE ONLY): 32 min  Charges:  $Therapeutic Exercise: 8-22 mins $Therapeutic Activity: 8-22 mins                     [name], PT Acute Rehabilitation Services Office:[phone] 08/23/2021    [name] 08/23/2021, 2:13 PM

## 2021-08-23 NOTE — TOC Progression Note (Addendum)
Transition of Care [facility]) - Progression Note    Patient Details  Name: Joshua Donovan MRN: [id number] Date of Birth: 16-Jan-1955  Transition of Care [facility]) CM/SW [place], RN Phone Number:[phone]  08/23/2021, 12:39 PM  Clinical Narrative:    CM made aware that daughter has questions about LTACH appeal update. CM attempted to call daughter Joshua Donovan @ [phone] with no answer. Voicemail has been left and CM will await return call.   1300 Cm received return call from daughter Joshua Donovan. Daughter wanted update on LTACH placement. CM informed daughter that we are awaiting appeal update, Daughter verbalized understanding. 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  She will bring copy of cards in to be scanned in to his records. TOC will continue to follow.    Expected Discharge Plan: Long Term Acute Care (LTAC) Barriers to Discharge: Continued Medical Work up  Expected Discharge Plan and Services Expected Discharge Plan: Long Term Acute Care (LTAC)   Discharge Planning Services: CM Consult Post Acute Care Choice: Long Term Acute Care (LTAC) Living arrangements for the past 2 months: Single Family Home                                       Social Determinants of Health (SDOH) Interventions    Readmission Risk Interventions     No data to display

## 2021-08-23 NOTE — Progress Notes (Signed)
Progress Note Patient: Joshua Donovan WUJ:[id number] DOB: 04/12/1955 DOA: 08/03/2021  DOS: the patient was seen and examined on 08/23/2021  Brief hospital course: Patient with history of ICH, type II DM, HTN, HLD presented to [facility] on 7/24 with right hand numbness and weakness, initially found to have a small brainstem stroke and ventral medulla.  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  He was intubated and transferred to [facility] for interventional radiology intervention.  Remained in the intensive care unit.  Now with tracheostomy and PEG tube placement.  Pending LTAC transfer.  7/24 presented to [facility], ventral medulla CVA 7/25 tx to [facility], treated with Cleviprex. 7/26 cerebral angiogram with stent placement to right vertebro-basilar junction, failed extubation, MRI brain> mild extension of stroke, now involving bilateral medial medullary, patent basilar artery and R VBJ stent  7/28 bedside tracheostomy by PCCM and PEG tube placement by general surgery 7/30 Hypertension persist despite adding oral agents, glucose remains elevated as well 7/31 Bleeding around trach site, bright red blood.  Underwent bronchoscopy, Trach Removed, patient orally reintubated, stoma packed. 8/2 ENT took patient to OR for trach redo 8/9 transferred to medical floor on tracheostomy now. 8/12 vomiting after bolus feeding with worsening hypoxia. 8/13 copious bleeding from the heparin injection site controlled with pressure dressing. 8/14 bleeding has resolved.  Trach secretions blood-tinged. Assessment and Plan: Acute hypoxic respiratory failure S/P Tracheostomy Vomiting after bolus feeding Possible aspiration pneumonitis Required intubation after admission due to worsening mentation. Failed extubation. Required tracheostomy tube by PCCM. Developed bleeding around the tracheostomy tube and therefore required redo tracheostomy by ENT. Was able to come down to  21% FiO2 on trach collar. Developed vomiting on 8/12 after bolus feeding and now currently on 40% FiO2. Chest x-ray shows no pneumonia or any other inflammatory condition. For now we will monitor.   Prolonged dysphagia. S/P PEG tube placement. Presents with stroke and unable to swallow safely due to need for ventilatory assistance. Underwent PEG tube placement by general surgery. Patient was transitioned to bolus feeds although unsure the reason. Patient currently unable to tolerated very well and therefore on continuous feed.   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 On Brilinta. Patient was loaded with aspirin and Brilinta in [facility]. Underwent stent placement by neuroradiology here at [facility]. Per neurology patient can come off of Brilinta if continues to have bleeding but we have to discuss with neuroradiology for confirmation. Unable to add Farxiga due to PEG tube status.  Hypertensive emergency. Grade 1 diastolic dysfunction. Blood pressure was severely elevated at the time of admission. Patient was requiring Drip Currently blood pressure stable. Monitor   HLD. LDL 116.  Continue statin.   Type 2 diabetes mellitus, uncontrolled with hyperglycemia with HLD. Hemoglobin A1c 8.3. Uncontrolled. Blood sugars are also elevated and currently uncontrolled. With both hyper and hypoglycemia. Unable to add [place].   Insulin regimen adjusted.  Monitor   History of alcohol use. 1 beer per day. Patient was on CIWA protocol.  Currently not in the withdrawal window.   Bleeding from the tracheostomy tube. Bleeding from the heparin injection site. Continue pressure dressing. Hemoglobin stable. PT/PTT stable. We will discuss with neuroradiology regarding DAPT regimen.  Subjective: Denies any acute complaint.  No nausea no vomiting.  No chest pain.  No further bleeding episode.  Communicating with yes or no  with blinking.  Physical Exam: Vitals:   08/23/21 1122 08/23/21 1124  08/23/21 1542 08/23/21 1925  BP:  (!) 133/90 (!) 160/85 124/76  Pulse: 77 76 82 83  Resp: (!) 35 (!) 35 (!) 33 (!) 34  Temp:  98.4 F (36.9 C)  99.1 F (37.3 C)  TempSrc:  Oral  Oral  SpO2: 97% 97% 92% 93%  Weight:      Height:       General: Appear in mild distress; no visible Abnormal Neck Mass Or lumps, Conjunctiva normal Cardiovascular: S1 and S2 Present, no Murmur, Respiratory: increased respiratory effort, Bilateral Air entry present and bilateral  Crackles, no wheezes Abdomen: Bowel Sound present, Non tender Extremities: no Pedal edema Neurology: alert and non verbal unable to move extremities Gait not checked due to patient safety concerns   Data Reviewed: I have Reviewed nursing notes, Vitals, and Lab results since pt's last encounter. Pertinent lab results CBC and BMP I have ordered test including CBC and BMP I have discussed pt's care plan and test results with neurology and pulmonary.   Family Communication: No one at bedside  Disposition: Status is: Inpatient Remains inpatient appropriate because: Requiring frequent suctioning with a tracheostomy tube trach and PEG dependent.  Need LTAC.  Author: Berle Mull, MD 08/23/2021 8:23 PM  Please look on [email or web address] to find out who is on call.

## 2021-08-23 NOTE — Progress Notes (Signed)
Occupational Therapy Treatment Patient Details Name: Joshua Donovan MRN: [id number] DOB: 1955-12-18 Today's Date: 08/23/2021   History of present illness Pt is a 66 y.o. male who presented 08/02/21 with R foot pain and R-sided weakness. Transferred to [facility] 7/25. 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 S/p stent placement to the R vertebrobasilar junction 7/26, failed extubation. Trach and PEG placed 7/28. S/p bronchoscopic evaluation, oral reintubation and tracheostomy removal with stoma packing 7/31 due to trach site bleeding. S/p trach revision 8/2. PMH: DM, GERD, TBI with prior ICH, HLD, HTN   OT comments  This 66 yo male seen today to assess Bil UEs (full PROM WNL, no AROM noted), eye movements (full AROM) with attempt at head movements following eye movements (no AROM noted, full PROM). Pt smiling and open to try to "yell" words so that lip movements are more pronounced. He will continue to benefit from skilled acute OT with follow up at [facility].   Recommendations for follow up therapy are one component of a multi-disciplinary discharge planning process, led by the attending physician.  Recommendations may be updated based on patient status, additional functional criteria and insurance authorization.    Follow Up Recommendations  OT at Long-term acute care hospital    Assistance Recommended at Discharge Frequent or constant Supervision/Assistance  Patient can return home with the following  Two people to help with walking and/or transfers;Two people to help with bathing/dressing/bathroom;Assistance with  cooking/housework;Assistance with feeding;Direct supervision/assist for medications management;Direct supervision/assist for financial management;Assist for transportation;Help with stairs or ramp for entrance   Equipment Recommendations  Other (comment) (TBD next venue)       Precautions / Restrictions Precautions Precautions: Fall Precaution Comments: trach collar, peg, SBP < 180, prevalon boots, copious trach secretions Restrictions Weight Bearing Restrictions: No                Extremity/Trunk Assessment Upper Extremity Assessment RUE Deficits / Details: No AROM noted even for shoulder shrug.  PROM WNL intact. LUE Deficits / Details: No AROM noted even for shoulder shrug.  PROM WNL WFL            Vision Baseline Vision/History: 0 No visual deficits Ability to See in Adequate Light: 0 Adequate Patient Visual Report: No change from baseline Eye Alignment: Within Functional Limits Ocular Range of Motion: Within Functional Limits Additional Comments: Using YES/NO communication board more easily now. Nystagmus is noted(especially with looking upwards)--reports he does not notice this nor is he having double vision          Cognition Arousal/Alertness: Awake/alert Behavior During Therapy: WFL for tasks assessed/performed Overall Cognitive Status: Difficult to assess                         Following Commands: Follows one step commands consistently (for moving eyes (left, right, up, down and tracking))       General Comments: Mouths some words throughout session--when he "yells"words they are easier to understand due to increased mouth movements        Exercises Other Exercises Other Exercises: Had him work on eye movements in direction I was also asking him to turn his head (  left, right, up, down). Full AROM of eyes and can lift eye brows, but no movement of head/neck felt.            Pertinent Vitals/ Pain       Pain Assessment Pain Assessment:  No/denies pain         Frequency  Min 2X/week        Progress Toward Goals  OT Goals(current goals can now be found in the care plan section)  Progress towards OT goals: Not progressing toward goals - comment (due to still with limited movement (only has eye and facial movements))  Acute Rehab OT Goals Patient Stated Goal: Agreeable to work with me OT Goal Formulation: Patient unable to participate in goal setting Time For Goal Achievement: 09/02/21 Potential to Achieve Goals: Energy Discharge plan remains appropriate       AM-PAC OT "6 Clicks" Daily Activity     Outcome Measure   Help from another person eating meals?: Total Help from another person taking care of personal grooming?: Total Help from another person toileting, which includes using toliet, bedpan, or urinal?: Total Help from another person bathing (including washing, rinsing, drying)?: Total Help from another person to put on and taking off regular upper body clothing?: Total Help from another person to put on and taking off regular lower body clothing?: Total 6 Click Score: 6    End of Session Equipment Utilized During Treatment: Oxygen (trach collar)  OT Visit Diagnosis: Other symptoms and signs involving the nervous system (R29.898)   Activity Tolerance Patient tolerated treatment well (alot of secretions)   Patient Left in bed;with call bell/phone within reach;with family/visitor present (family entered as I was leaving room)           Time: 3748-2707 OT Time Calculation (min): 22 min  Charges: OT General Charges $OT Visit: 1 Visit OT Treatments $Therapeutic Activity: 8-22 mins  Golden Circle, OTR/L Acute Rehab Services [facility] [phone] Office [phone]    Almon Register 08/23/2021, 10:24 AM

## 2021-08-23 NOTE — Progress Notes (Addendum)
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  Symptoms worsened and required transfer to [facility] for neuro IR intervention where he had a stent placed in R vertebrovasilar junction.  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  He had a tracheostomy performed on 7/26.    Pertinent  Medical History  TBI with ICH, DM, HTN, HLD  Significant Hospital Events: Including procedures, antibiotic start and stop dates in addition to other pertinent events   7/24 presented to [facility], ventral medulla CVA 7/25 tx to [facility] 7/26 cerebral angiogram with stent placement to right vertebro-basilar junction, failed extubation, left radial aline MRI brain> mild extension of stroke, now involving bilateral medial medullary, patent basilar artery and R VBJ stent  7/28 underwent bedside percutaneous tracheostomy and PEG tube placement, tolerated well 7/29 no acute issues overnight currently on SBT trial this a.m. and tolerating well, Cleviprex resumed earlier this a.m. due to severe hypertension 7/30 Hypertension persist despite adding oral agents, glucose remains elevated as well 7/31 Bleeding around trach site, bright red blood. Copious bloody secretions. Cleviprex off, SBPs 150s-160s. Basal insulin/TF coverage increased. Cefepime deescalated to ceftriaxone. CXR stable. Later in afternoon, continued peritrach bleeding. Bronchoscopic eval completed with intratracheal bleeding associated with trach. Removed, patient orally reintubated, stoma packed. 8/1 ENT consulted for trach revision. Lightly sedated. Vent full support/PRVC, given fatigue following events of yesterday. ENT attempted to evaluate trach at bedside, could not pass  scope. Plan for OR 8/2. ASA/Brilinta held. 8/2 to OR for trach redo 8/6 trach collar during day shift, back on vent overnight 8/10 Hemoptysis, blood with suctioning > improved 8/11 8/14 on 8L / 35% ATC   Interim History / Subjective:  35%/8L ATC No bloody secretions overnight > some bloody this am RN reports heparin injection yesterday > kept bleeding pinpoint, added foam dressing, later covered in blood (lot for heparin shot).  Not as much suctioning from 8/13 - today.  NTS with thick copious secretions  Objective   Blood pressure (!) 156/89, pulse 84, temperature 98 F (36.7 C), temperature source Oral, resp. rate (!) 30, height '5\' 7"'$  (1.702 m), weight 82.7 kg, SpO2 100 %.    FiO2 (%):  [35 %-40 %] 35 %   Intake/Output Summary (Last 24 hours) at 08/23/2021 0857 Last data filed at 08/23/2021 0426 Gross per 24 hour  Intake 225.16 ml  Output 850 ml  Net -624.84 ml   Filed Weights   08/19/21 0500 08/20/21 0500 08/23/21 0500  Weight: 84.7 kg 84.1 kg 82.7 kg    Examination: General:  chronically ill appearing adult male lying in bed in NAD  HEENT: MM pink/moist, anicteric, trach midline c/d/i, sutures intact, scant old bloody dried secretions at site   Neuro: Eyes open, alert when name called, tracks provider, smiles, attempts to answer yes/no questions by mouthing the words CV: s1s2 RRR, no m/r/g PULM: non-labored at rest, lungs bilaterally clear GI: soft, bsx4 active  Extremities: warm/dry, trace dependent edema  Skin: no rashes or lesions  BMP - Na 141, K 3.6, glucose 268, BUN 24, sr cr 0.79, Mg 1.9 CBC - WBC 8.9, Hgb 13, platelets 446  CXR 8/13 - no focal infiltrate  Resolved Hospital Problem list   Enterobacter HCAP  Assessment & Plan:   Acute Hypoxemic Respiratory Failure in setting of Stroke Tracheostomy Status > bleeding from initial tracheostomy, s/p redo tracheostomy 8/2 by ENT -trach care per protocol  -clip trach sutures  -asked RN to replace protective  dressing under trach  -monitor bloody drainage  -minimize suction trauma as able  -ATC, wean O2 for sats >90% -pulmonary hygiene as able  -currently not a decannulation candidate due to immobility & secretion management  -follow intermittent CXR -appreciate ENT assistance with trach revision  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 Please call back sooner if new needs arise.   Best Practice (right click and "Reselect all SmartList Selections" daily)  Per Primary / TRH      Noe Gens, MSN, APRN, NP-C, AGACNP-BC [facility] 08/23/2021, 8:57 AM   Please see [email or web address] for pager details.   From 7A-7P if no response, please call [phone] After hours, please call ELink [phone]

## 2021-08-24 LAB — GLUCOSE, CAPILLARY
Glucose-Capillary: 137 mg/dL — ABNORMAL HIGH (ref 70–99)
Glucose-Capillary: 190 mg/dL — ABNORMAL HIGH (ref 70–99)
Glucose-Capillary: 216 mg/dL — ABNORMAL HIGH (ref 70–99)
Glucose-Capillary: 171 mg/dL — ABNORMAL HIGH (ref 70–99)
Glucose-Capillary: 178 mg/dL — ABNORMAL HIGH (ref 70–99)
Glucose-Capillary: 200 mg/dL — ABNORMAL HIGH (ref 70–99)

## 2021-08-24 MED ORDER — INSULIN DETEMIR 100 UNIT/ML ~~LOC~~ SOLN
60.0000 [IU] | Freq: Every day | SUBCUTANEOUS | Status: DC
  Administered 2021-08-24 – 2021-09-03 (×11): 60 [IU] via SUBCUTANEOUS
  Filled 2021-08-24 (×11): qty 0.6

## 2021-08-24 NOTE — Progress Notes (Signed)
Physical Therapy Treatment Patient Details Name: Joshua Donovan MRN: [id number] DOB: 1955/04/30 Today's Date: 08/24/2021   History of Present Illness Pt is a 66 y.o. male who presented 08/02/21 with R foot pain and R-sided weakness. Transferred to [facility] 7/25. 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 S/p stent placement to the R vertebrobasilar junction 7/26, failed extubation. Trach and PEG placed 7/28. S/p bronchoscopic evaluation, oral reintubation and tracheostomy removal with stoma packing 7/31 due to trach site bleeding. S/p trach revision 8/2. PMH: DM, GERD, TBI with prior ICH, HLD, HTN    PT Comments    Patient with some very trace movement when asked to push into therapist with hip and knee flexed on R more than L.  Still needing frequent suctioning during session coughing up secretions.  HR down to 48 when up in lift initially, but returned to 95 once lowered to chair.  Feel he remains appropriate for LTACH vs SNF.  PT to follow.   Recommendations for follow up therapy are one component of a multi-disciplinary discharge planning process, led by the attending physician.  Recommendations may be updated based on patient status, additional functional criteria and insurance authorization.  Follow Up Recommendations  Skilled nursing-short term rehab (<3 hours/day)     Assistance Recommended at Discharge Frequent or constant Supervision/Assistance  Patient can return home with the following Assistance with cooking/housework;Two people to help with walking and/or transfers;Two people to help with bathing/dressing/bathroom;Assistance  with feeding;Direct supervision/assist for medications management;Direct supervision/assist for financial management;Assist for transportation;Help with stairs or ramp for entrance   Equipment Recommendations  Other (comment) (TBA)    Recommendations for Other Services       Precautions / Restrictions Precautions Precautions: Fall Precaution Comments: trach collar, peg, SBP < 180, prevalon boots, copious trach secretions, watch HR     Mobility  Bed Mobility Overal bed mobility: Needs Assistance Bed Mobility: Rolling Rolling: Total assist, +2 for physical assistance, +2 for safety/equipment         General bed mobility comments: rolling to place lift sling    Transfers Overall transfer level: Needs assistance Equipment used: Ambulation equipment used Transfers: Bed to chair/wheelchair/BSC             General transfer comment: up to chair via lift; increased time for positioning for good head support and for safety Transfer via Lift Equipment: Maximove  Ambulation/Gait                   Stairs             Wheelchair Mobility    Modified Rankin (Stroke Patients Only) Modified Rankin (Stroke Patients Only) Pre-Morbid Rankin Score: No symptoms Modified Rankin: Severe disability     Balance     Sitting balance-Leahy Scale: Zero Sitting balance - Comments: extra time for positioning in chair for proper head support                                    Cognition Arousal/Alertness: Awake/alert Behavior During Therapy: WFL for tasks assessed/performed Overall Cognitive Status: Difficult to assess  Exercises General Exercises - Lower Extremity Ankle Circles/Pumps: PROM, 10 reps, Supine, Both Heel Slides: PROM, Both, 10 reps, Supine Other Exercises Other Exercises: stretches to hamstrings, gluts and heel cords    General Comments General comments (skin integrity, edema,  etc.): HR down to 48 when initially up in lift but pt without symptoms, once positioned in chair back to 95, RN aware.  Maintained on trach collar throughout @ 28%      Pertinent Vitals/Pain Pain Assessment Pain Assessment: Faces Faces Pain Scale: Hurts even more Pain Location: grimacing with hamstring stretch Pain Descriptors / Indicators: Grimacing Pain Intervention(s): Monitored during session, Repositioned, Limited activity within patient's tolerance    Home Living                          Prior Function            PT Goals (current goals can now be found in the care plan section) Progress towards PT goals: Progressing toward goals    Frequency    Min 3X/week      PT Plan Current plan remains appropriate    Co-evaluation              AM-PAC PT "6 Clicks" Mobility   Outcome Measure  Help needed turning from your back to your side while in a flat bed without using bedrails?: Total Help needed moving from lying on your back to sitting on the side of a flat bed without using bedrails?: Total Help needed moving to and from a bed to a chair (including a wheelchair)?: Total Help needed standing up from a chair using your arms (e.g., wheelchair or bedside chair)?: Total Help needed to walk in hospital room?: Total Help needed climbing 3-5 steps with a railing? : Total 6 Click Score: 6    End of Session Equipment Utilized During Treatment: Oxygen Activity Tolerance: Patient tolerated treatment well Patient left: in chair;with call bell/phone within reach;with chair alarm set Nurse Communication: Need for lift equipment;Mobility status PT Visit Diagnosis: Muscle weakness (generalized) (M62.81);Difficulty in walking, not elsewhere classified (R26.2);Other symptoms and signs involving the nervous system (R29.898);Unsteadiness on feet (R26.81)     Time: 5374-8270 PT Time Calculation (min) (ACUTE ONLY): 30 min  Charges:  $Therapeutic Exercise: 8-22  mins $Therapeutic Activity: 8-22 mins                     Joshua Donovan, PT Acute Rehabilitation Services [phone] 08/24/2021    Joshua Donovan 08/24/2021, 4:07 PM

## 2021-08-24 NOTE — TOC Progression Note (Addendum)
Transition of Care [facility]) - Progression Note    Patient Details  Name: Joshua Donovan MRN: [id number] Date of Birth: [birth year]  [facility] CM/SW [place], [place] Phone Number: 08/24/2021, 1:49 PM  Clinical Narrative:     Update: 2:00-pm- patient daughter, Joelene Millin called  back, inquired about denial. CSW advised will send message to LTACH/Jen- CSW was given permission to fax out referral- CSW discussed limited SNF facilities in the area that can admit and manage patient's w/trach. Family states understanding.   CSW contacted daughter, Renard Matter- informed of denial- informed will need to send out referral for SNF- family requested to talk w/patient's other daughters, before starting SNF process.   TOC will continue to follow and assist with discharge planning.  Thurmond Butts, MSW, LCSW Clinical Social Worker    Expected Discharge Plan: Long Term Acute Care (LTAC) Barriers to Discharge: Continued Medical Work up  Expected Discharge Plan and Services Expected Discharge Plan: Long Term Acute Care (LTAC)   Discharge Planning Services: CM Consult Post Acute Care Choice: Long Term Acute Care (LTAC) Living arrangements for the past 2 months: Single Family Home                                       Social Determinants of Health (SDOH) Interventions    Readmission Risk Interventions     No data to display

## 2021-08-24 NOTE — NC FL2 (Signed)
[place] LEVEL OF CARE SCREENING TOOL     IDENTIFICATION  Patient Name: Joshua Donovan Birthdate: 09-Dec-1955 Sex: male Admission Date (Current Location): 08/03/2021  [facility] and [place] Number:  Herbalist and Address:  [facility] [place]. [facility] [place] [address] [place], [place] [address]      Provider Number: [id number]  Attending Physician Name and Address:  Lavina Hamman, MD  Relative Name and Phone Number:       Current Level of Care: Hospital Recommended Level of Care: [place] Prior Approval Number:    Date Approved/Denied:   PASRR Number: [id number]  Discharge Plan: SNF    Current Diagnoses: Patient Active Problem List   Diagnosis Date Noted   Acute respiratory failure with hypoxia ([place])    Right foot pain 08/03/2021   Alcohol abuse 08/03/2021   CVA (cerebral vascular accident) ([place]) 08/03/2021   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[place]) 08/03/2021   Dysuria 01/23/2019   Encounter for general adult medical examination with abnormal findings 01/23/2019   Type 2 diabetes mellitus with hyperglycemia, without long-term current use of insulin ([place]) 10/31/2018   Erectile dysfunction 10/31/2018   Mixed hyperlipidemia 07/10/2018   Uncontrolled type 2 diabetes mellitus with hyperglycemia ([place]) 06/10/2018   Hypertension 05/24/2018   Encounter for screening colonoscopy    Benign neoplasm of descending colon    Polyp of sigmoid colon    Rectal polyp    Closed fracture of temporal bone ([place]) 09/09/2015   Right frontal lobe punctate hemorrhage (HCC) 09/09/2015   Angioedema 07/19/2015   Malignant essential hypertension 07/19/2015    Orientation RESPIRATION BLADDER Height & Weight     Self  Tracheostomy   Weight: 182 lb 12.2 oz (82.9 kg) Height:  '5\' 7"'$  (170.2 cm)  BEHAVIORAL SYMPTOMS/MOOD NEUROLOGICAL BOWEL NUTRITION STATUS        Diet (Peg tube)  AMBULATORY STATUS COMMUNICATION OF NEEDS Skin   Total Care Verbally  Surgical wounds (closed incision Neck, skin tear, left thigh posterior, wound incision skin tear scrotum)                       Personal Care Assistance Level of Assistance  Bathing, Feeding, Dressing Bathing Assistance: Maximum assistance Feeding assistance: Maximum assistance       Functional Limitations Info  Sight, Hearing, Speech Sight Info: Adequate Hearing Info: Adequate Speech Info: Impaired    SPECIAL CARE FACTORS FREQUENCY  PT (By licensed PT), OT (By licensed OT)     PT Frequency: 5x per week OT Frequency: 5x per week            Contractures Contractures Info: Not present    Additional Factors Info  Code Status, Allergies Code Status Info: FULL Allergies Info: Lisinopril,Anchovies,Sulfa Antibiotics           Current Medications (08/24/2021):  This is the current hospital active medication list Current Facility-Administered Medications  Medication Dose Route Frequency Provider Last Rate Last Admin   acetaminophen (TYLENOL) tablet 650 mg  650 mg Oral Q4H PRN Simonne Maffucci B, MD   650 mg at 08/16/21 0321   Or   acetaminophen (TYLENOL) 160 MG/5ML solution 650 mg  650 mg Per Tube Q4H PRN Simonne Maffucci B, MD   650 mg at 08/18/21 0530   Or   acetaminophen (TYLENOL) suppository 650 mg  650 mg Rectal Q4H PRN Juanito Doom, MD       amLODipine (NORVASC) tablet 10 mg  10 mg Per Tube Daily Juanito Doom, MD   10 mg at 08/24/21 2831   aspirin chewable tablet 81 mg  81 mg Per Tube Daily Simonne Maffucci B, MD   81 mg at 08/24/21 0843   atorvastatin (LIPITOR) tablet 80 mg  80 mg Per Tube Daily Simonne Maffucci B, MD   80 mg at 08/24/21 0843   bisacodyl (DULCOLAX) suppository 10 mg  10 mg Rectal Daily PRN Simonne Maffucci B, MD       carvedilol (COREG) tablet 25 mg  25 mg Per Tube BID WC Simonne Maffucci B, MD   25 mg at 08/24/21 0843   Chlorhexidine Gluconate Cloth 2 % PADS 6 each  6 each Topical Q0600 Juanito Doom, MD   6 each at 08/24/21 0513    cloNIDine (CATAPRES) tablet 0.1 mg  0.1 mg Per Tube TID Simonne Maffucci B, MD   0.1 mg at 08/24/21 1300   feeding supplement ([name]) liquid 1,000 mL  1,000 mL Per Tube Continuous Lavina Hamman, MD 60 mL/hr at 08/24/21 1148 1,000 mL at 08/24/21 1148   feeding supplement (PROSource TF20) liquid 60 mL  60 mL Per Tube Daily Barb Merino, MD   60 mL at 08/24/21 0843   fiber (NUTRISOURCE FIBER) 1 packet  1 packet Per Tube BID Juanito Doom, MD   1 packet at 51/76/16 0737   folic acid (FOLVITE) tablet 1 mg  1 mg Per Tube Daily Simonne Maffucci B, MD   1 mg at 08/24/21 0843   free water 150 mL  150 mL Per Tube Q4H Lavina Hamman, MD   150 mL at 08/24/21 1149   guaiFENesin-codeine 100-10 MG/5ML solution 5 mL  5 mL Per Tube Q4H PRN Simonne Maffucci B, MD   5 mL at 08/21/21 2157   hydrALAZINE (APRESOLINE) injection 10 mg  10 mg Intravenous Q6H PRN Simonne Maffucci B, MD   10 mg at 08/15/21 2121   hydrochlorothiazide (HYDRODIURIL) tablet 12.5 mg  12.5 mg Per Tube Daily Simonne Maffucci B, MD   12.5 mg at 08/24/21 0843   insulin aspart (novoLOG) injection 0-15 Units  0-15 Units Subcutaneous Q4H Lavina Hamman, MD   5 Units at 08/24/21 1153   insulin aspart (novoLOG) injection 6 Units  6 Units Subcutaneous Q4H Lavina Hamman, MD   6 Units at 08/24/21 1153   insulin detemir (LEVEMIR) injection 60 Units  60 Units Subcutaneous Daily Lavina Hamman, MD   60 Units at 08/24/21 0900   labetalol (NORMODYNE) injection 10-20 mg  10-20 mg Intravenous Q2H PRN Simonne Maffucci B, MD   20 mg at 08/13/21 1836   loratadine (CLARITIN) tablet 10 mg  10 mg Per Tube Daily Simonne Maffucci B, MD   10 mg at 08/24/21 0843   multivitamin with minerals tablet 1 tablet  1 tablet Per Tube Daily Barb Merino, MD   1 tablet at 08/24/21 0842   ondansetron (ZOFRAN) injection 4 mg  4 mg Intravenous Q6H PRN Stark Klein, MD   4 mg at 08/21/21 1245   Oral care mouth rinse  15 mL Mouth Rinse Q2H McQuaid, Nathaneil Canary  B, MD   15 mL at 08/24/21 1300   Oral care mouth rinse  15 mL Mouth Rinse PRN Simonne Maffucci B, MD       oxyCODONE (ROXICODONE) 5 MG/5ML solution 5-10 mg  5-10 mg Per Tube Q6H PRN Juanito Doom, MD   5 mg at 08/21/21 1846  senna-docusate (Senokot-S) tablet 1 tablet  1 tablet Per Tube QHS PRN Simonne Maffucci B, MD       thiamine (VITAMIN B1) tablet 100 mg  100 mg Per Tube Daily Simonne Maffucci B, MD   100 mg at 08/24/21 0843   ticagrelor (BRILINTA) tablet 90 mg  90 mg Per Tube BID Juanito Doom, MD   90 mg at 08/24/21 0737     Discharge Medications: Please see discharge summary for a list of discharge medications.  Relevant Imaging Results:  Relevant Lab Results:   Additional Information SSN [id number]  Vinie Sill, LCSW

## 2021-08-24 NOTE — Progress Notes (Signed)
Speech Language Pathology Treatment: Cognitive-Linquistic;Passy Muir Speaking valve  Patient Details Name: Joshua Donovan MRN: [id number] DOB: 03/08/1955 Today's Date: 08/24/2021 Time: 1702-1800 SLP Time Calculation (min) (ACUTE ONLY): 58 min  Assessment / Plan / Recommendation Clinical Impression  Joshua Donovan was participatory and made great efforts during session. His daughter, Joelene Millin, was at the bedside and was very involved and helpful. PMV was put in place for brief intervals - pt was able to achieve very low volume phonation today. Required max verbal cues to engage vocal folds; this allowed occasional intelligible speech at the single word level. Secretions were thick and unfortunately inhibitory. However, I think prognosis for using a PMV is good. Lip/jaw mobility is excellent; there is obvious tongue weakness but is improved from time of initial assessment.   We trialed use of a laser pen light that he held in his teeth to point to pictured items - he was able to move pen with some control vertically with his mouth, but lateral movement was challenging.  SLP will continue efforts to identify short-term alternative communication while we work on the long term goal of natural speech. Joelene Millin asked appropriate questions and they were answered to the best of my ability.     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 He  presented 08/02/21 with R foot pain and R-sided weakness. Transferred to [facility] 7/25. 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 S/p stent placement to the R vertebrobasilar junction 7/26, failed  extubation. Trach and PEG placed 7/28. PMH: DM, GERD, TBI with prior ICH, HLD, HTN      SLP Plan  Continue with current plan of care      Recommendations for follow up therapy are one component of a multi-disciplinary discharge planning process, led by the attending physician.  Recommendations may be updated based on patient status, additional functional criteria and insurance authorization.    Recommendations  Diet recommendations: NPO      Patient may use Passy-Muir Speech Valve: with SLP only         Oral Care Recommendations: Oral care BID;Oral care QID Assistance recommended at discharge: Frequent or constant Supervision/Assistance SLP Visit Diagnosis: Dysarthria and anarthria (R47.1) Plan: Continue with current plan of care        Joshua Duggar L. Tivis Ringer, MA CCC/SLP Clinical Specialist - Acute Care SLP Acute Rehabilitation Services Office number [phone]    Joshua Donovan  08/24/2021, 6:22 PM

## 2021-08-24 NOTE — Progress Notes (Addendum)
Progress Note Patient: Joshua Donovan JOA:[id number] DOB: 08-30-1955 DOA: 08/03/2021  DOS: the patient was seen and examined on 08/24/2021  Brief hospital course: Patient with history of ICH, type II DM, HTN, HLD presented to [facility] on 7/24 with right hand numbness and weakness, initially found to have a small brainstem stroke and ventral medulla.  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  He was intubated and transferred to [facility] for interventional radiology intervention.  Remained in the intensive care unit.  Now with tracheostomy and PEG tube placement.  Pending LTAC transfer.  7/24 presented to [facility], ventral medulla CVA 7/25 tx to [facility], treated with Cleviprex. 7/26 cerebral angiogram with stent placement to right vertebro-basilar junction, failed extubation, MRI brain> mild extension of stroke, now involving bilateral medial medullary, patent basilar artery and R VBJ stent  7/28 bedside tracheostomy by PCCM and PEG tube placement by general surgery 7/30 Hypertension persist despite adding oral agents, glucose remains elevated as well 7/31 Bleeding around trach site, bright red blood.  Underwent bronchoscopy, Trach Removed, patient orally reintubated, stoma packed. 8/2 ENT took patient to OR for trach redo 8/9 transferred to medical floor on tracheostomy now. 8/12 vomiting after bolus feeding with worsening hypoxia. 8/13 copious bleeding from the heparin injection site controlled with pressure dressing. 8/14 bleeding has resolved.  Trach secretions blood-tinged. 8/15 insurance denied LTAC appeal.  CSW working on SNF.  Assessment and Plan: Acute hypoxic respiratory failure S/P Tracheostomy Vomiting after bolus feeding Possible aspiration pneumonitis Required intubation after admission due to worsening mentation. Failed extubation. Required tracheostomy tube by PCCM. Developed bleeding around the tracheostomy tube and therefore  required redo tracheostomy by ENT. Was able to come down to 21% FiO2 on trach collar. Developed vomiting on 8/12 after bolus feeding and now currently on 40% FiO2. Chest x-ray shows no pneumonia or any other inflammatory condition. Management per PCCM.  Not a candidate for decannulation due to lack of mobility.   Prolonged dysphagia. S/P PEG tube placement. Presents with stroke and unable to swallow safely due to need for ventilatory assistance. Underwent PEG tube placement by general surgery. Patient was transitioned to bolus feeds although unsure the reason. Patient currently unable to tolerate bolus feedings and therefore will remain on continuous feed.   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 On Brilinta. Patient was loaded with aspirin and Brilinta in [facility]. Underwent stent placement by neuroradiology here at [facility]. Per neurology patient can come off of Brilinta if continues to have bleeding but we have to discuss with neuroradiology for confirmation. Unable to add Farxiga due to PEG tube status.  Hypertensive emergency. Grade 1 diastolic dysfunction. Blood pressure was severely elevated at the time of admission. Patient was requiring Drip Currently blood pressure stable. Monitor   HLD. LDL 116.  Continue statin.   Type 2 diabetes mellitus, uncontrolled with hyperglycemia with HLD. Hemoglobin A1c 8.3. Currently uncontrolled with both hyper and hypoglycemia. Unable to add [place].   Insulin regimen adjusted in response to hyperglycemia.  Monitor   History of alcohol use. 1 beer per day. Patient was on CIWA protocol.  Currently not in the withdrawal window.   Bleeding from the tracheostomy tube. Bleeding from the heparin injection site. Continue pressure dressing. Hemoglobin stable. PT/PTT stable. Discussed with neurology and recommend that if the patient continues to have bleeding episode  it would be okay to switch to aspirin plus Plavix or aspirin alone.  If the patient has any further bleeding, we will also discuss with neuroradiology regarding DAPT regimen.  Subjective: No acute complaint.  Patient is mouthing words.  Blink response is not that reliable as the patient blinks to tell me that he is in pain but he is not able to localize the pain despite his best effort.  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  Disposition: Status is: Inpatient Remains inpatient appropriate because: Trach and PEG dependent.  Author: Berle Mull, MD 08/24/2021 8:41 PM  Please look on [email or web address] to find out who is on call.

## 2021-08-24 NOTE — TOC Progression Note (Signed)
Transition of Care [facility]) - Progression Note    Patient Details  Name: Joshua Donovan MRN: [id number] Date of Birth: 1955/12/28  Transition of Care [facility]) CM/SW [place], RN Phone [phone]  08/24/2021, 8:45 AM  Clinical Narrative:    CM received voicemail from [facility] appeals requesting updated clinicals for the past 72 hours. [facility] [place] select liaison made aware and will fax clinicals.    Expected Discharge Plan: Long Term Acute Care (LTAC) Barriers to Discharge: Continued Medical Work up  Expected Discharge Plan and Services Expected Discharge Plan: Long Term Acute Care (LTAC)   Discharge Planning Services: CM Consult Post Acute Care Choice: Long Term Acute Care (LTAC) Living arrangements for the past 2 months: Single Family Home                                       Social Determinants of Health (SDOH) Interventions    Readmission Risk Interventions     No data to display

## 2021-08-25 DIAGNOSIS — R1310 Dysphagia, unspecified: Secondary | ICD-10-CM

## 2021-08-25 DIAGNOSIS — J9500 Unspecified tracheostomy complication: Secondary | ICD-10-CM

## 2021-08-25 DIAGNOSIS — Z930 Tracheostomy status: Secondary | ICD-10-CM

## 2021-08-25 LAB — CBC
HCT: 39.4 % (ref 39.0–52.0)
Hemoglobin: 12.9 g/dL — ABNORMAL LOW (ref 13.0–17.0)
MCH: 28.7 pg (ref 26.0–34.0)
MCHC: 32.7 g/dL (ref 30.0–36.0)
MCV: 87.6 fL (ref 80.0–100.0)
Platelets: 452 10*3/uL — ABNORMAL HIGH (ref 150–400)
RBC: 4.5 MIL/uL (ref 4.22–5.81)
RDW: 13.1 % (ref 11.5–15.5)
WBC: 8.2 10*3/uL (ref 4.0–10.5)
nRBC: 0 % (ref 0.0–0.2)

## 2021-08-25 LAB — GLUCOSE, CAPILLARY
Glucose-Capillary: 164 mg/dL — ABNORMAL HIGH (ref 70–99)
Glucose-Capillary: 180 mg/dL — ABNORMAL HIGH (ref 70–99)
Glucose-Capillary: 182 mg/dL — ABNORMAL HIGH (ref 70–99)
Glucose-Capillary: 204 mg/dL — ABNORMAL HIGH (ref 70–99)
Glucose-Capillary: 245 mg/dL — ABNORMAL HIGH (ref 70–99)
Glucose-Capillary: 247 mg/dL — ABNORMAL HIGH (ref 70–99)

## 2021-08-25 LAB — BASIC METABOLIC PANEL
Anion gap: 10 (ref 5–15)
BUN: 24 mg/dL — ABNORMAL HIGH (ref 8–23)
CO2: 29 mmol/L (ref 22–32)
Calcium: 9.1 mg/dL (ref 8.9–10.3)
Chloride: 100 mmol/L (ref 98–111)
Creatinine, Ser: 0.71 mg/dL (ref 0.61–1.24)
GFR, Estimated: >60 mL/min (ref >60)
Glucose, Bld: 177 mg/dL — ABNORMAL HIGH (ref 70–99)
Potassium: 3.3 mmol/L — ABNORMAL LOW (ref 3.5–5.1)
Sodium: 139 mmol/L (ref 135–145)

## 2021-08-25 NOTE — Progress Notes (Signed)
Nutrition Follow-up  DOCUMENTATION CODES:   Not applicable  INTERVENTION:   Tube Feeding via PEG:  Jevity 1.5 @ 60 ml/hr 60 ml Pro-Source TF 20 daily  Provides: 2240 kcal, 110 grams protein, and 1080 ml free water.   150 ml free water every 4 hours Total free water: 1980 ml   MVI with minerals daily   NUTRITION DIAGNOSIS:   Inadequate oral intake related to acute illness as evidenced by NPO status. Ongoing.   GOAL:   Patient will meet greater than or equal to 90% of their needs Met with TF at goal  MONITOR:   Vent status, Labs, Weight trends, TF tolerance  REASON FOR ASSESSMENT:   Consult Enteral/tube feeding initiation and management  ASSESSMENT:   66 yo male admitted post brain stem stroke involving ventral medulla requiring stent placement to right vertebro-basilar junction. PMH includes HTN, TBI, DM, HLD  Pt remains on trach collar with 5L Pt with medial medullary syndrome with quadriplegia/quadriparesis  [facility] to [facility], plan for SNF  7/24 Admitted to [facility], ventral medulla CVA 7/25 Tx to [facility] 7/26 Cerebral angiogram with stent placement to right vertebro-basilar junction, failed extubation, reintubated 7/28 s/p PEG placement 8/2 s/p trach revision with ENT in the OR 8/9 tx to floor  Labs: K 3.3 CBGs 164-247  Meds: nutrisource fiber BID, folic acid, ss novolog, 6 units novolog every 4 hours, 60 units levemir daily, MVI with minerals, Thiamine   Diet Order:   Diet Order             Diet NPO time specified  Diet effective midnight                   EDUCATION NEEDS:   Not appropriate for education at this time  Skin:  Skin Assessment: Reviewed RN Assessment  Last BM:  8/16 small; type 6  Height:   Ht Readings from Last 1 Encounters:  08/04/21 _0  (1.702 m)    Weight:   Wt Readings from Last 1 Encounters:  08/24/21 82.9 kg    BMI:  Body mass index is 28.62 kg/m.  Estimated Nutritional Needs:   Kcal:  2000-2200  kcals  Protein:  100-115 g  Fluid:  >/= 2 L  Valerya Maxton P., RD, LDN, CNSC See AMiON for contact information

## 2021-08-25 NOTE — Assessment & Plan Note (Deleted)
Most recent hemoglobin A1C of 8.3%. uncontrolled with hyper- and hypoglycemia. -Continue Levemir 60 units daily, Novolog 6 units every 4 hours and SSI

## 2021-08-25 NOTE — Assessment & Plan Note (Signed)
In setting of DAPT. ENT consulted for revision. Bleeding resolved.

## 2021-08-25 NOTE — TOC Progression Note (Signed)
Transition of Care [facility]) - Progression Note    Patient Details  Name: Dominick Morella MRN: [id number] Date of Birth: 1955-05-26  Transition of Care [facility]) CM/SW [place], [place] Phone Number: 08/25/2021, 10:46 AM  Clinical Narrative:     Patient has no bed offers  TOC continues to follow and assist with discharge planning  Expected Discharge Plan: Long Term Acute Care (LTAC) Barriers to Discharge: Continued Medical Work up  Expected Discharge Plan and Services Expected Discharge Plan: [place] (LTAC)   Discharge Planning Services: CM Consult Post Acute Care Choice: Long Term Acute Care (LTAC) Living arrangements for the past 2 months: Single Family Home                                       Social Determinants of Health (SDOH) Interventions    Readmission Risk Interventions     No data to display

## 2021-08-25 NOTE — Progress Notes (Signed)
PROGRESS NOTE    Joshua Donovan  ONG:[id number] DOB: August 31, 1955 DOA: 08/03/2021 PCP: [facility]   Brief Narrative: Patient with history of ICH, type II DM, HTN, HLD presented to [facility] on 7/24 with right hand numbness and weakness, initially found to have a small brainstem stroke and ventral medulla.  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  He was intubated and transferred to [facility] for interventional radiology intervention.  Remained in the intensive care unit.  Now with tracheostomy and PEG tube placement.  Pending LTAC transfer.  7/24 presented to [facility], ventral medulla CVA 7/25 tx to [facility], treated with Cleviprex. 7/26 cerebral angiogram with stent placement to right vertebro-basilar junction, failed extubation, MRI brain> mild extension of stroke, now involving bilateral medial medullary, patent basilar artery and R VBJ stent  7/28 bedside tracheostomy by PCCM and PEG tube placement by general surgery 7/30 Hypertension persist despite adding oral agents, glucose remains elevated as well 7/31 Bleeding around trach site, bright red blood.  Underwent bronchoscopy, Trach Removed, patient orally reintubated, stoma packed. 8/2 ENT took patient to OR for trach redo 8/9 transferred to medical floor on tracheostomy now. 8/12 vomiting after bolus feeding with worsening hypoxia. 8/13 copious bleeding from the heparin injection site controlled with pressure dressing. 8/14 bleeding has resolved.  Trach secretions blood-tinged. 8/15 insurance denied LTAC appeal.  CSW working on SNF.   Assessment and Plan: * Acute stroke of medulla oblongata [facility]) Neurology consulted. Patient initially loaded with aspirin and Brilinta. Cerebral angiogram performed on 7/26 with placement of a stent of right vertebro-basilar junction. -Continue aspirin and Brilinta  Alcohol abuse Initially managed on CIWA. -Continue thiamine,  multivitamin and folic acid  Mixed hyperlipidemia -Continue Lipitor  Uncontrolled type 2 diabetes mellitus with hyperglycemia (HCC) Most recent hemoglobin A1C of 8.3%. uncontrolled with hyper- and hypoglycemia. -Continue Levemir 60 units daily, Novolog 6 units every 4 hours and SSI  Hypertension Previously with hypertensive emergency which has now resolved. -Continue clonidine, hydrochlorothiazide and Coreg  Dysphagia Patient is s/p PEG tube and is on tube feeds via PEG. -Continue tube feeds and free water  Acute respiratory failure with hypoxia [facility]) Patient required ICU admission and mechanical ventilation after admission secondary to worsening mentation. Concern for possible aspiration pneumonitis. Patient unable to extubate. Tracheostomy placed on 7/31 and revised by ENT on 8/2 secondary to bleeding. Currently stable.  Tracheostomy complication (HCC)-resolved as of 08/25/2021 In setting of DAPT. ENT consulted for revision. Bleeding resolved.    DVT prophylaxis: SCDs Code Status:   Code Status: Full Code Family Communication: Daughter at bedside Disposition Plan: Acute inpatient rehabilitation   Consultants:    Procedures:    Antimicrobials:     Subjective: Patient reports no issues overnight. Daughter is concerned about some green colored sputum and possibly recurrent pneumonia.  Objective: BP 136/80 (BP Location: Left Arm)   Pulse 87   Temp 98.6 F (37 C) (Oral)   Resp (!) 33   Ht '5\' 7"'$  (1.702 m)   Wt 82.9 kg   SpO2 95%   BMI 28.62 kg/m   Examination:  General exam: Appears calm and comfortable Respiratory system: Clear to auscultation and diminished. Respiratory effort normal. Cardiovascular system: S1 & S2 heard, RRR. No murmurs. Gastrointestinal system: Abdomen is nondistended, soft and nontender. Normal bowel sounds heard. Central nervous system: Alert and oriented. Quadriplegia. Musculoskeletal: No significant edema noted.    Data Reviewed: I  have personally reviewed following labs and  imaging studies  CBC Lab Results  Component Value Date   WBC 8.2 08/25/2021   RBC 4.50 08/25/2021   HGB 12.9 (L) 08/25/2021   HCT 39.4 08/25/2021   MCV 87.6 08/25/2021   MCH 28.7 08/25/2021   PLT 452 (H) 08/25/2021   MCHC 32.7 08/25/2021   RDW 13.1 08/25/2021   LYMPHSABS 1.1 08/23/2021   MONOABS 1.0 08/23/2021   EOSABS 0.2 08/23/2021   BASOSABS 0.1 51/02/5850     Last metabolic panel Lab Results  Component Value Date   NA 139 08/25/2021   K 3.3 (L) 08/25/2021   CL 100 08/25/2021   CO2 29 08/25/2021   BUN 24 (H) 08/25/2021   CREATININE 0.71 08/25/2021   GLUCOSE 177 (H) 08/25/2021   GFRNONAA >60 08/25/2021   GFRAA >60 07/20/2015   CALCIUM 9.1 08/25/2021   PHOS 4.4 08/22/2021   PROT 7.1 08/22/2021   ALBUMIN 2.9 (L) 08/22/2021   BILITOT 0.5 08/22/2021   ALKPHOS 43 08/22/2021   AST 28 08/22/2021   ALT 44 08/22/2021   ANIONGAP 10 08/25/2021    GFR: Estimated Creatinine Clearance: 93.5 mL/min (by C-G formula based on SCr of 0.71 mg/dL).  No results found for this or any previous visit (from the past 240 hour(s)).    Radiology Studies: No results found.    LOS: 22 days    Cordelia Poche, MD [facility] 08/25/2021, 8:21 PM   If 7PM-7AM, please contact night-coverage [email or web address]

## 2021-08-25 NOTE — Assessment & Plan Note (Signed)
Patient is s/p PEG tube and is on tube feeds via PEG. -Continue tube feeds and free water

## 2021-08-25 NOTE — Assessment & Plan Note (Addendum)
Patient required ICU admission and mechanical ventilation after admission secondary to worsening mentation. Concern for possible aspiration pneumonitis. Patient unable to extubate. Tracheostomy placed on 7/31 and revised by ENT on 8/2 secondary to bleeding. Currently stable. -PCCM for trach management -Guaifenesin q4 hours to help with secretions -hypertonic saline neb BID for mucociliary clearance -follow resp culture, CXR today with new L basilar opacities, atelectasis or pneumonia.  Appreciate pulm, recommending hypertonic saline, guaifenesin.  Follow resp culture (resp flora), no current indication for abx.

## 2021-08-26 LAB — GLUCOSE, CAPILLARY
Glucose-Capillary: 157 mg/dL — ABNORMAL HIGH (ref 70–99)
Glucose-Capillary: 188 mg/dL — ABNORMAL HIGH (ref 70–99)
Glucose-Capillary: 179 mg/dL — ABNORMAL HIGH (ref 70–99)
Glucose-Capillary: 143 mg/dL — ABNORMAL HIGH (ref 70–99)
Glucose-Capillary: 130 mg/dL — ABNORMAL HIGH (ref 70–99)
Glucose-Capillary: 100 mg/dL — ABNORMAL HIGH (ref 70–99)

## 2021-08-26 MED ORDER — GUAIFENESIN 100 MG/5ML PO LIQD
5.0000 mL | Freq: Two times a day (BID) | ORAL | Status: DC
  Administered 2021-08-26 – 2021-08-28 (×6): 5 mL
  Filled 2021-08-26 (×6): qty 10

## 2021-08-26 MED ORDER — INSULIN ASPART 100 UNIT/ML IJ SOLN
8.0000 [IU] | INTRAMUSCULAR | Status: DC
  Administered 2021-08-26 – 2021-08-27 (×4): 8 [IU] via SUBCUTANEOUS

## 2021-08-26 NOTE — TOC Progression Note (Signed)
Transition of Care [facility]) - Progression Note    Patient Details  Name: Joshua Donovan MRN: [id number] Date of Birth: 04-08-1955  Transition of Care [facility]) CM/SW [place], [place] Phone Number: 08/26/2021, 10:01 AM  Clinical Narrative:     Patient has no bed offers  Expected Discharge Plan: Long Term Acute Care (LTAC) Barriers to Discharge: Continued Medical Work up  Expected Discharge Plan and Services Expected Discharge Plan: [place] (LTAC)   Discharge Planning Services: CM Consult Post Acute Care Choice: Long Term Acute Care (LTAC) Living arrangements for the past 2 months: Single Family Home                                       Social Determinants of Health (SDOH) Interventions    Readmission Risk Interventions     No data to display

## 2021-08-26 NOTE — Progress Notes (Signed)
Occupational Therapy Treatment Patient Details Name: Joshua Donovan MRN: [id number] DOB: Oct 29, 1955 Today's Date: 08/26/2021   History of present illness Pt is a 66 y.o. male who presented 08/02/21 with R foot pain and R-sided weakness. Transferred to [facility] 7/25. 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 S/p stent placement to the R vertebrobasilar junction 7/26, failed extubation. Trach and PEG placed 7/28. S/p bronchoscopic evaluation, oral reintubation and tracheostomy removal with stoma packing 7/31 due to trach site bleeding. S/p trach revision 8/2. PMH: DM, GERD, TBI with prior ICH, HLD, HTN   OT comments  Pt making limited progress. Pt continues to require total A for all aspects of care. This session attempted sip and puff  call bell, however pt is unable to produce breath to activate it. Pt continues to be locked in with no noticeable muscle activation. OT will continue to work on [place] mobility, communicating with AE, and motor recovery.    Recommendations for follow up therapy are one component of a multi-disciplinary discharge planning process, led by the attending physician.  Recommendations may be updated based on patient status, additional functional criteria and insurance authorization.    Follow Up Recommendations  OT at Long-term acute care hospital    Assistance Recommended at Discharge Frequent or constant Supervision/Assistance  Patient can return home with the following  Two people to help with walking and/or transfers;Two people to help with bathing/dressing/bathroom;Assistance with cooking/housework;Assistance  with feeding;Direct supervision/assist for medications management;Direct supervision/assist for financial management;Assist for transportation;Help with stairs or ramp for entrance   Equipment Recommendations  Other (comment) (TBD at next venue)    Recommendations for Other Services      Precautions / Restrictions Precautions Precautions: Fall Precaution Comments: trach collar, peg, SBP < 180, prevalon boots, copious trach secretions, watch HR Restrictions Weight Bearing Restrictions: No       Mobility Bed Mobility Overal bed mobility: Needs Assistance Bed Mobility: Rolling Rolling: Total assist, +2 for physical assistance, +2 for safety/equipment         General bed mobility comments: rolling to place lift sling    Transfers Overall transfer level: Needs assistance   Transfers: Bed to chair/wheelchair/BSC             General transfer comment: up to chair via lift; increased time for positioning for good head support and for safety Transfer via Lift Equipment: Maximove   Balance Overall balance assessment: Needs assistance   Sitting balance-Leahy Scale: Zero Sitting balance - Comments: extra time for positioning in chair for proper head support                                   ADL either performed or assessed with clinical judgement   ADL Overall ADL's : Needs assistance/impaired                                       General ADL Comments: total A for all aspects of his care    Extremity/Trunk Assessment  Vision       Perception     Praxis      Cognition Arousal/Alertness: Awake/alert Behavior During Therapy: WFL for tasks assessed/performed Overall Cognitive Status: Difficult to assess                                          Exercises Exercises: General Upper Extremity General Exercises - Upper Extremity Shoulder Flexion: PROM, Both, 10 reps, Supine Shoulder ABduction: PROM,  Both, 10 reps, Supine Shoulder Horizontal ABduction: PROM, Both, 10 reps, Supine Shoulder Horizontal ADduction: PROM, Both, 10 reps, Supine Elbow Flexion: PROM, Both, 10 reps, Supine Elbow Extension: PROM, Both, 10 reps, Supine Wrist Flexion: PROM, Both, 10 reps, Supine Wrist Extension: PROM, Both, 10 reps, Supine Digit Composite Flexion: PROM, Both, 10 reps, Supine Composite Extension: PROM, Both, 10 reps, Supine    Shoulder Instructions       General Comments VSS.  Attempted sip n puff call bell, but pt unable to use and noted no air flow from mouth.    Pertinent Vitals/ Pain       Pain Assessment Pain Assessment: Faces Faces Pain Scale: Hurts little more Pain Location: grimacing with hamstring stretch Pain Descriptors / Indicators: Grimacing Pain Intervention(s): Monitored during session, Repositioned  Home Living                                          Prior Functioning/Environment              Frequency  Min 2X/week        Progress Toward Goals  OT Goals(current goals can now be found in the care plan section)  Progress towards OT goals: Progressing toward goals  Acute Rehab OT Goals Patient Stated Goal: none stated this session OT Goal Formulation: Patient unable to participate in goal setting Time For Goal Achievement: 09/02/21 Potential to Achieve Goals: Fair ADL Goals Pt/caregiver will Perform Home Exercise Program: Both right and left upper extremity;With written HEP provided Additional ADL Goal #1: Pt's family will be educated about bed positioning to avoid pressure sores and increase care of skin Additional ADL Goal #2: Pt will use soft touch call bell to call nurses by moving head with min assist. Additional ADL Goal #3: Therapist to explore other options for call bell use if pt unable to move head to use soft touch system.  Plan Discharge plan remains appropriate    Co-evaluation    PT/OT/SLP Co-Evaluation/Treatment:  Yes Reason for Co-Treatment: For patient/therapist safety;To address functional/ADL transfers PT goals addressed during session: Mobility/safety with mobility;Strengthening/ROM OT goals addressed during session: ADL's and self-care      AM-PAC OT "6 Clicks" Daily Activity     Outcome Measure   Help from another person eating meals?: Total Help from another person taking care of personal grooming?: Total Help from another person toileting, which includes using toliet, bedpan, or urinal?: Total Help from another person bathing (including washing, rinsing, drying)?: Total Help from another person to put on and taking off regular upper body clothing?: Total Help from another person to put on and taking off regular lower body clothing?: Total 6 Click Score: 6    End of Session Equipment Utilized During Treatment: Oxygen  OT Visit Diagnosis: Other symptoms and signs involving the nervous system (R29.898)  Activity Tolerance Patient tolerated treatment well   Patient Left in chair;with call bell/phone within reach;with chair alarm set   Nurse Communication Mobility status        Time: 7282-0601 OT Time Calculation (min): 36 min  Charges: OT General Charges $OT Visit: 1 Visit OT Treatments $Therapeutic Activity: 8-22 mins  Kaizer Dissinger H., OTR/L Acute Rehabilitation  Lelania Bia Elane Ivannah Zody 08/26/2021, 7:24 PM

## 2021-08-26 NOTE — Progress Notes (Signed)
PROGRESS NOTE    Joshua Donovan  KGY:[id number] DOB: 1955-11-26 DOA: 08/03/2021 [facility]CP: [facility]   Brief Narrative: Patient with history of ICH, type II DM, HTN, HLD presented to [facility] on 7/24 with right hand numbness and weakness, initially found to have a small brainstem stroke and ventral medulla.  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  He was intubated and transferred to [facility] for interventional radiology intervention.  Remained in the intensive care unit.  Now with tracheostomy and PEG tube placement.  Pending LTAC transfer.  7/24 presented to [facility], ventral medulla CVA 7/25 tx to [facility], treated with Cleviprex. 7/26 cerebral angiogram with stent placement to right vertebro-basilar junction, failed extubation, MRI brain> mild extension of stroke, now involving bilateral medial medullary, patent basilar artery and R VBJ stent  7/28 bedside tracheostomy by PCCM and PEG tube placement by general surgery 7/30 Hypertension persist despite adding oral agents, glucose remains elevated as well 7/31 Bleeding around trach site, bright red blood.  Underwent bronchoscopy, Trach Removed, patient orally reintubated, stoma packed. 8/2 ENT took patient to OR for trach redo 8/9 transferred to medical floor on tracheostomy now. 8/12 vomiting after bolus feeding with worsening hypoxia. 8/13 copious bleeding from the heparin injection site controlled with pressure dressing. 8/14 bleeding has resolved.  Trach secretions blood-tinged. 8/15 insurance denied LTAC appeal.  CSW working on SNF.   Assessment and Plan: * Acute stroke of medulla oblongata [facility]) Neurology consulted. Patient initially loaded with aspirin and Brilinta. Cerebral angiogram performed on 7/26 with placement of a stent of right vertebro-basilar junction. -Continue aspirin and Brilinta  Alcohol abuse Initially managed on CIWA. -Continue thiamine,  multivitamin and folic acid  Mixed hyperlipidemia -Continue Lipitor  Uncontrolled type 2 diabetes mellitus with hyperglycemia (HCC) Most recent hemoglobin A1C of 8.3%. uncontrolled with hyper- and hypoglycemia. -Continue Levemir 60 units daily -Increase to Novolog 8 units every 4 hours -Discontinue SSI  Hypertension Previously with hypertensive emergency which has now resolved. -Continue clonidine, hydrochlorothiazide and Coreg  Dysphagia Patient is s/p PEG tube and is on tube feeds via PEG. -Continue tube feeds and free water  Acute respiratory failure with hypoxia [facility]) Patient required ICU admission and mechanical ventilation after admission secondary to worsening mentation. Concern for possible aspiration pneumonitis. Patient unable to extubate. Tracheostomy placed on 7/31 and revised by ENT on 8/2 secondary to bleeding. Currently stable.  Tracheostomy complication (HCC)-resolved as of 08/25/2021 In setting of DAPT. ENT consulted for revision. Bleeding resolved.    DVT prophylaxis: SCDs Code Status:   Code Status: Full Code Family Communication: None at bedside Disposition Plan: LTAC pending insurance appeal vs SNF.   Consultants:  PCCM Neurology ENT  Procedures:    Antimicrobials: Ceftriaxone Cefepime   Subjective: No apparent issues. Difficult to communicate with patient. Patient unable to phonate  Objective: BP 138/83 (BP Location: Left Arm)   Pulse 82   Temp 98.8 F (37.1 C) (Oral)   Resp 20   Ht '5\' 7"'$  (1.702 m)   Wt 85.4 kg   SpO2 96%   BMI 29.49 kg/m   Examination:  General exam: Appears calm and comfortable  Respiratory system: Diminished. Respiratory effort normal. Trach. Cardiovascular system: S1 & S2 heard, RRR. No murmurs, rubs, gallops or clicks. Gastrointestinal system: Abdomen is nondistended, soft and nontender. Normal bowel sounds heard. Central nervous system: Alert. Musculoskeletal: No calf tenderness Skin: No cyanosis. No  rashes   Data Reviewed: I have personally reviewed  following labs and imaging studies  CBC Lab Results  Component Value Date   WBC 8.2 08/25/2021   RBC 4.50 08/25/2021   HGB 12.9 (L) 08/25/2021   HCT 39.4 08/25/2021   MCV 87.6 08/25/2021   MCH 28.7 08/25/2021   PLT 452 (H) 08/25/2021   MCHC 32.7 08/25/2021   RDW 13.1 08/25/2021   LYMPHSABS 1.1 08/23/2021   MONOABS 1.0 08/23/2021   EOSABS 0.2 08/23/2021   BASOSABS 0.1 16/60/6004     Last metabolic panel Lab Results  Component Value Date   NA 139 08/25/2021   K 3.3 (L) 08/25/2021   CL 100 08/25/2021   CO2 29 08/25/2021   BUN 24 (H) 08/25/2021   CREATININE 0.71 08/25/2021   GLUCOSE 177 (H) 08/25/2021   GFRNONAA >60 08/25/2021   GFRAA >60 07/20/2015   CALCIUM 9.1 08/25/2021   PHOS 4.4 08/22/2021   PROT 7.1 08/22/2021   ALBUMIN 2.9 (L) 08/22/2021   BILITOT 0.5 08/22/2021   ALKPHOS 43 08/22/2021   AST 28 08/22/2021   ALT 44 08/22/2021   ANIONGAP 10 08/25/2021    GFR: Estimated Creatinine Clearance: 94.8 mL/min (by C-G formula based on SCr of 0.71 mg/dL).  No results found for this or any previous visit (from the past 240 hour(s)).    Radiology Studies: No results found.    LOS: 23 days    Cordelia Poche, MD [facility] 08/26/2021, 4:08 PM   If 7PM-7AM, please contact night-coverage [email or web address]

## 2021-08-26 NOTE — Progress Notes (Signed)
Speech Language Pathology Treatment: [name] Speaking valve;Cognitive-Linquistic  Patient Details Name: Joshua Donovan MRN: [id number] DOB: 03-04-1955 Today's Date: 08/26/2021 Time: 0240-9735 SLP Time Calculation (min) (ACUTE ONLY): 27 min  Assessment / Plan / Recommendation Clinical Impression  Pt was seen for ongoing communication and PMV tx. He had secretions sitting on his chest around his trach hub that were cleaned. PMV was donned with audible secretions noted. Pt has trouble coughing to command, but when he would cough spontaneously, it was primarily effective at clearing secretions tracheally. Pt communicated largely through mouthing yes/no responses. Daughter, Renard Matter, was present, and says that family primarily tries to make him mouth words, but that he does get frustrated. We reviewed materials already in room to facilitate communication when he may start to get frustrated, although trying to speak is certainly good practice. Phonation could not be achieved with PMV placed today - question in part due to secretions since he was able to produce some the other day. Pt/daughter updated that there is no update yet on a loaner device, but will continue efforts.    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 He  presented 08/02/21 with R foot pain and R-sided weakness. Transferred to [facility] 7/25. 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 S/p stent placement to the R vertebrobasilar junction 7/26, failed extubation. Trach and PEG placed 7/28. PMH: DM, GERD, TBI with prior ICH, HLD, HTN      SLP Plan  Continue with current  plan of care      Recommendations for follow up therapy are one component of a multi-disciplinary discharge planning process, led by the attending physician.  Recommendations may be updated based on patient status, additional functional criteria and insurance authorization.    Recommendations         Patient may use Passy-Muir Speech Valve: with SLP only         Oral Care Recommendations: Oral care QID Follow Up Recommendations: Skilled nursing-short term rehab (<3 hours/day) Assistance recommended at discharge: Frequent or constant Supervision/Assistance SLP Visit Diagnosis: Dysarthria and anarthria (R47.1) Plan: Continue with current plan of care           Osie Bond., M.A. [place] Office [phone]  Secure chat preferred   08/26/2021, 10:25 AM

## 2021-08-26 NOTE — Progress Notes (Signed)
Physical Therapy Treatment Patient Details Name: Joshua Donovan MRN: [id number] DOB: [birth year] Today's Date: 08/26/2021   History of Present Illness Pt is a 66 y.o. male who presented 08/02/21 with R foot pain and R-sided weakness. Transferred to [facility] 7/25. 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 S/p stent placement to the R vertebrobasilar junction 7/26, failed extubation. Trach and PEG placed 7/28. S/p bronchoscopic evaluation, oral reintubation and tracheostomy removal with stoma packing 7/31 due to trach site bleeding. S/p trach revision 8/2. PMH: DM, GERD, TBI with prior ICH, HLD, HTN    PT Comments    Patient with limited progress and feel LE activation felt previously may have been during inspiration efforts.  Patient unable to use sip n puff call bell with no air movement from mouth.  Also attempted to elicit muscle contraction with NMES, but unsuccessful this attempt.   Feel he is benefiting from attempts for mobilizing OOB and for progress with communication and motor recovery.  PT will continue to follow acutely.  Recommendations for follow up therapy are one component of a multi-disciplinary discharge planning process, led by the attending physician.  Recommendations may be updated based on patient status, additional functional criteria and insurance authorization.  Follow Up Recommendations  Skilled nursing-short term rehab (<3 hours/day)     Assistance Recommended at Discharge Frequent or constant Supervision/Assistance  Patient can return home with the following Assistance with cooking/housework;Two people to help  with walking and/or transfers;Two people to help with bathing/dressing/bathroom;Assistance with feeding;Direct supervision/assist for medications management;Direct supervision/assist for financial management;Assist for transportation;Help with stairs or ramp for entrance   Equipment Recommendations  Other (comment) (TBA)    Recommendations for Other Services       Precautions / Restrictions Precautions Precautions: Fall Precaution Comments: trach collar, peg, SBP < 180, prevalon boots, copious trach secretions, watch HR     Mobility  Bed Mobility Overal bed mobility: Needs Assistance Bed Mobility: Rolling Rolling: Total assist, +2 for physical assistance, +2 for safety/equipment         General bed mobility comments: rolling to place lift sling    Transfers Overall transfer level: Needs assistance   Transfers: Bed to chair/wheelchair/BSC             General transfer comment: up to chair via lift; increased time for positioning for good head support and for safety Transfer via Lift Equipment: Maximove  Ambulation/Gait                   Stairs             Wheelchair Mobility    Modified Rankin (Stroke Patients Only)       Balance Overall balance assessment: Needs assistance   Sitting balance-Leahy Scale: Zero Sitting balance - Comments: extra time for positioning in chair for proper head support                                    Cognition Arousal/Alertness: Awake/alert Behavior During Therapy: WFL for tasks assessed/performed Overall Cognitive Status: Difficult to assess  Exercises General Exercises - Lower Extremity Ankle Circles/Pumps: PROM, 10 reps, Supine, Both Heel Slides: PROM, Both, 10 reps, Supine Hip ABduction/ADduction: PROM, Right, 10 reps, Supine    General Comments General comments (skin integrity, edema, etc.): VSS.  Attempted sip n puff call bell,  but pt unable to use and noted no air flow from mouth.  Also attempted e-stim for muscle contraction, but unable to elicit on L forearm and L ant tib.  Will continue attempts.      Pertinent Vitals/Pain Pain Assessment Pain Assessment: Faces Faces Pain Scale: Hurts little more Pain Location: grimacing with hamstring stretch Pain Descriptors / Indicators: Grimacing Pain Intervention(s): Monitored during session, Limited activity within patient's tolerance    Home Living                          Prior Function            PT Goals (current goals can now be found in the care plan section) Progress towards PT goals: Progressing toward goals    Frequency    Min 3X/week      PT Plan Current plan remains appropriate    Co-evaluation PT/OT/SLP Co-Evaluation/Treatment: Yes Reason for Co-Treatment: For patient/therapist safety;To address functional/ADL transfers PT goals addressed during session: Mobility/safety with mobility;Strengthening/ROM        AM-PAC PT "6 Clicks" Mobility   Outcome Measure  Help needed turning from your back to your side while in a flat bed without using bedrails?: Total Help needed moving from lying on your back to sitting on the side of a flat bed without using bedrails?: Total Help needed moving to and from a bed to a chair (including a wheelchair)?: Total Help needed standing up from a chair using your arms (e.g., wheelchair or bedside chair)?: Total Help needed to walk in hospital room?: Total Help needed climbing 3-5 steps with a railing? : Total 6 Click Score: 6    End of Session Equipment Utilized During Treatment: Oxygen Activity Tolerance: Patient tolerated treatment well Patient left: in chair;with call bell/phone within reach;with chair alarm set Nurse Communication: Need for lift equipment;Mobility status PT Visit Diagnosis: Muscle weakness (generalized) (M62.81);Difficulty in walking, not elsewhere classified (R26.2);Other  symptoms and signs involving the nervous system (R29.898);Unsteadiness on feet (R26.81) Hemiplegia - Right/Left:  (quadriparesis) Hemiplegia - dominant/non-dominant:  (quadriparesis) Hemiplegia - caused by: Cerebral infarction     Time: 1500-1550 PT Time Calculation (min) (ACUTE ONLY): 50 min  Charges:  $Therapeutic Activity: 8-22 mins                     [name], PT Acute Rehabilitation Services [phone] 08/26/2021    [name] 08/26/2021, 6:16 PM

## 2021-08-26 NOTE — Progress Notes (Signed)
NAME:  Joshua Donovan, MRN:  [id number], DOB:  1955/04/17, LOS: 9 ADMISSION DATE:  08/03/2021, CONSULTATION DATE:  7/26 REFERRING MD:  Colvin Caroli FOR CONSULT:  CVA   History of Present Illness:  66 y/o male presented to [facility] on 7/24 with R hand numbness and weakness, found to have small brainstem stroke in ventral medulla.  Symptoms worsened and required transfer to [facility] for neuro IR intervention where he had a stent placed in R vertebrovasilar junction.  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  He had a tracheostomy performed on 7/26.    Pertinent  Medical History  TBI with ICH, DM, HTN, HLD  Significant Hospital Events: Including procedures, antibiotic start and stop dates in addition to other pertinent events   7/24 presented to [facility], ventral medulla CVA 7/25 tx to [facility] 7/26 cerebral angiogram with stent placement to right vertebro-basilar junction, failed extubation, left radial aline MRI brain> mild extension of stroke, now involving bilateral medial medullary, patent basilar artery and R VBJ stent  7/28 underwent bedside percutaneous tracheostomy and PEG tube placement, tolerated well 7/29 no acute issues overnight currently on SBT trial this a.m. and tolerating well, Cleviprex resumed earlier this a.m. due to severe hypertension 7/30 Hypertension persist despite adding oral agents, glucose remains elevated as well 7/31 Bleeding around trach site, bright red blood. Copious bloody secretions. Cleviprex off, SBPs 150s-160s. Basal insulin/TF coverage increased. Cefepime deescalated to ceftriaxone. CXR stable. Later in afternoon, continued peritrach bleeding. Bronchoscopic eval completed with intratracheal bleeding associated with trach. Removed, patient orally reintubated, stoma packed. 8/1 ENT consulted for trach revision. Lightly sedated. Vent full support/PRVC, given fatigue following events of yesterday. ENT attempted to evaluate trach at bedside, could not pass  scope. Plan for OR 8/2. ASA/Brilinta held. 8/2 to OR for trach redo 8/6 trach collar during day shift, back on vent overnight 8/10 Hemoptysis, blood with suctioning > improved 8/11 8/14 on 8L / 35% ATC   Interim History / Subjective:  No overnight issues.  Sister at bedside, notes worsening secretions.  Unable to phonate with PMV today  Objective   Blood pressure (!) 149/89, pulse 89, temperature 99.2 F (37.3 C), temperature source Oral, resp. rate (!) 22, height '5\' 7"'$  (1.702 m), weight 85.4 kg, SpO2 97 %.    FiO2 (%):  [28 %] 28 %   Intake/Output Summary (Last 24 hours) at 08/26/2021 1110 Last data filed at 08/26/2021 3762 Gross per 24 hour  Intake --  Output 1300 ml  Net -1300 ml   Filed Weights   08/23/21 0500 08/24/21 0500 08/26/21 0421  Weight: 82.7 kg 82.9 kg 85.4 kg    Examination: Chronically ill, awake, alert Laying flat in bed, no respiratory distress Copious non-bloody tracheal secretions, on trach collar, cuffless trach   Resolved Hospital Problem list   Enterobacter HCAP  Assessment & Plan:   Acute Hypoxemic Respiratory Failure in setting of Stroke Tracheostomy Status > bleeding from initial tracheostomy, s/p redo tracheostomy 8/2 by ENT 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 to R vertebro-basilar junction -trach care per protocol  -minimize suction trauma as able  -ATC, wean O2 for sats >90% -pulmonary hygiene as able  -currently not a decannulation candidate due to immobility & secretion management  -follow intermittent CXR -appreciate ENT assistance with trach revision - will try to order cough assist device to help with secretion management since he is awake and alert, but weak.  - I am  scheduling guaifenisen to help with with thinning out his secretions and am discontinuing the claritin for now which may be thickening them up.  - continue PMV trials with SLP as tolerated    PCCM will continue  to follow for trach care needs, will plan to see next 8/21, call us sooner if we can be of assistance.   Lenice Llamas, MD Pulmonary and [place] 08/26/2021 11:20 AM Pager: see AMION  If no response to pager, please call critical care on call (see AMION) until 7pm After 7:00 pm call Elink

## 2021-08-27 LAB — GLUCOSE, CAPILLARY
Glucose-Capillary: 169 mg/dL — ABNORMAL HIGH (ref 70–99)
Glucose-Capillary: 195 mg/dL — ABNORMAL HIGH (ref 70–99)
Glucose-Capillary: 226 mg/dL — ABNORMAL HIGH (ref 70–99)
Glucose-Capillary: 173 mg/dL — ABNORMAL HIGH (ref 70–99)
Glucose-Capillary: 199 mg/dL — ABNORMAL HIGH (ref 70–99)
Glucose-Capillary: 191 mg/dL — ABNORMAL HIGH (ref 70–99)

## 2021-08-27 LAB — BASIC METABOLIC PANEL
Anion gap: 11 (ref 5–15)
BUN: 20 mg/dL (ref 8–23)
CO2: 30 mmol/L (ref 22–32)
Calcium: 9.1 mg/dL (ref 8.9–10.3)
Chloride: 97 mmol/L — ABNORMAL LOW (ref 98–111)
Creatinine, Ser: 0.75 mg/dL (ref 0.61–1.24)
GFR, Estimated: >60 mL/min (ref >60)
Glucose, Bld: 169 mg/dL — ABNORMAL HIGH (ref 70–99)
Potassium: 3.5 mmol/L (ref 3.5–5.1)
Sodium: 138 mmol/L (ref 135–145)

## 2021-08-27 MED ORDER — INSULIN ASPART 100 UNIT/ML IJ SOLN
10.0000 [IU] | INTRAMUSCULAR | Status: DC
  Administered 2021-08-27 – 2021-10-10 (×238): 10 [IU] via SUBCUTANEOUS

## 2021-08-27 NOTE — Progress Notes (Signed)
PROGRESS NOTE    Joshua Donovan  OIZ:[id number] DOB: 05-24-1955 DOA: 08/03/2021 PCP: [facility] [place]   Brief Narrative: Patient with history of ICH, type II DM, HTN, HLD presented to [facility] on 7/24 with right hand numbness and weakness, initially found to have a small brainstem stroke and ventral medulla.  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  He was intubated and transferred to [facility] for interventional radiology intervention.  Remained in the intensive care unit.  Now with tracheostomy and PEG tube placement.  Pending LTAC transfer.  7/24 presented to [facility], ventral medulla CVA 7/25 tx to [facility], treated with Cleviprex. 7/26 cerebral angiogram with stent placement to right vertebro-basilar junction, failed extubation, MRI brain> mild extension of stroke, now involving bilateral medial medullary, patent basilar artery and R VBJ stent  7/28 bedside tracheostomy by PCCM and PEG tube placement by general surgery 7/30 Hypertension persist despite adding oral agents, glucose remains elevated as well 7/31 Bleeding around trach site, bright red blood.  Underwent bronchoscopy, Trach Removed, patient orally reintubated, stoma packed. 8/2 ENT took patient to OR for trach redo 8/9 transferred to medical floor on tracheostomy now. 8/12 vomiting after bolus feeding with worsening hypoxia. 8/13 copious bleeding from the heparin injection site controlled with pressure dressing. 8/14 bleeding has resolved.  Trach secretions blood-tinged. 8/15 insurance denied LTAC appeal.  CSW working on SNF.   Assessment and Plan: * Acute stroke of medulla oblongata [facility]) Neurology consulted. Patient initially loaded with aspirin and Brilinta. Cerebral angiogram performed on 7/26 with placement of a stent of right vertebro-basilar junction. -Continue aspirin and Brilinta  Alcohol abuse Initially managed on CIWA. -Continue thiamine,  multivitamin and folic acid  Mixed hyperlipidemia -Continue Lipitor  Uncontrolled type 2 diabetes mellitus with hyperglycemia (HCC) Most recent hemoglobin A1C of 8.3%. uncontrolled with hyper- and hypoglycemia. -Continue Levemir 60 units daily -Increase to Novolog 10 units every 4 hours  Hypertension Previously with hypertensive emergency which has now resolved. -Continue clonidine, hydrochlorothiazide and Coreg  Dysphagia Patient is s/p PEG tube and is on tube feeds via PEG. -Continue tube feeds and free water  Acute respiratory failure with hypoxia [facility]) Patient required ICU admission and mechanical ventilation after admission secondary to worsening mentation. Concern for possible aspiration pneumonitis. Patient unable to extubate. Tracheostomy placed on 7/31 and revised by ENT on 8/2 secondary to bleeding. Currently stable.  Tracheostomy complication (HCC)-resolved as of 08/25/2021 In setting of DAPT. ENT consulted for revision. Bleeding resolved.    DVT prophylaxis: SCDs Code Status:   Code Status: Full Code Family Communication: None at bedside Disposition Plan: LTAC pending insurance appeal vs SNF. Patient is medically stable for discharge.   Consultants:  PCCM Neurology ENT  Procedures:    Antimicrobials: Ceftriaxone Cefepime   Subjective: No issues noted overnight.  Objective: BP (!) 142/88   Pulse 86   Temp 98.9 F (37.2 C) (Axillary)   Resp (!) 27   Ht '5\' 7"'$  (1.702 m)   Wt 85.4 kg   SpO2 96%   BMI 29.49 kg/m   Examination:  General: No distress   Data Reviewed: I have personally reviewed following labs and imaging studies  CBC Lab Results  Component Value Date   WBC 8.2 08/25/2021   RBC 4.50 08/25/2021   HGB 12.9 (L) 08/25/2021   HCT 39.4 08/25/2021   MCV 87.6 08/25/2021   MCH 28.7 08/25/2021   PLT 452 (H) 08/25/2021   MCHC 32.7 08/25/2021  RDW 13.1 08/25/2021   LYMPHSABS 1.1 08/23/2021   MONOABS 1.0 08/23/2021   EOSABS 0.2  08/23/2021   BASOSABS 0.1 68/37/2902     Last metabolic panel Lab Results  Component Value Date   NA 138 08/27/2021   K 3.5 08/27/2021   CL 97 (L) 08/27/2021   CO2 30 08/27/2021   BUN 20 08/27/2021   CREATININE 0.75 08/27/2021   GLUCOSE 169 (H) 08/27/2021   GFRNONAA >60 08/27/2021   GFRAA >60 07/20/2015   CALCIUM 9.1 08/27/2021   PHOS 4.4 08/22/2021   PROT 7.1 08/22/2021   ALBUMIN 2.9 (L) 08/22/2021   BILITOT 0.5 08/22/2021   ALKPHOS 43 08/22/2021   AST 28 08/22/2021   ALT 44 08/22/2021   ANIONGAP 11 08/27/2021    GFR: Estimated Creatinine Clearance: 94.8 mL/min (by C-G formula based on SCr of 0.75 mg/dL).  No results found for this or any previous visit (from the past 240 hour(s)).    Radiology Studies: No results found.    LOS: 24 days    Cordelia Poche, MD [facility] 08/27/2021, 4:05 PM   If 7PM-7AM, please contact night-coverage [email or web address]

## 2021-08-27 NOTE — Progress Notes (Signed)
Speech Language Pathology Treatment: [name] Speaking valve  Patient Details Name: Joshua Donovan MRN: [id number] DOB: 09/16/1955 Today's Date: 08/27/2021 Time: 6599-3570 SLP Time Calculation (min) (ACUTE ONLY): 10 min  Assessment / Plan / Recommendation Clinical Impression  Pt was seen for PMV tx, still awaiting SGD. PMV was donned for only a few seconds at a time (although up to ~30 seconds Max). Pt was not able to vocalize with valve today, with back pressure building up quickly despite efforts to try to clear secretions (pt not able to cough to command). Given difficulties with PMV use, SLP also used multimodal communication during session to include Yes/No communication board with eye gaze in conjunction with mouthing. Will continue to follow.    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 He  presented 08/02/21 with R foot pain and R-sided weakness. Transferred to [facility] 7/25. 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 S/p stent placement to the R vertebrobasilar junction 7/26, failed extubation. Trach and PEG placed 7/28. PMH: DM, GERD, TBI with prior ICH, HLD, HTN      SLP Plan  Continue with current plan of care      Recommendations for follow up therapy are one component of a multi-disciplinary discharge planning process, led by the attending physician.  Recommendations may be updated based on patient status, additional functional criteria and insurance authorization.    Recommendations         Patient may use Passy-Muir Speech Valve: with SLP only         Oral Care Recommendations: Oral care  QID Follow Up Recommendations: Skilled nursing-short term rehab (<3 hours/day) Assistance recommended at discharge: Frequent or constant Supervision/Assistance SLP Visit Diagnosis: Dysarthria and anarthria (R47.1) Plan: Continue with current plan of care           Osie Bond., M.A. [place] Office [phone]  Secure chat preferred   08/27/2021, 3:49 PM

## 2021-08-27 NOTE — Plan of Care (Signed)
Problem: Education: Goal: Knowledge of General Education information will improve Description: Including pain rating scale, medication(s)/side effects and non-pharmacologic comfort measures Outcome: Progressing   Problem: Health Behavior/Discharge Planning: Goal: Ability to manage health-related needs will improve Outcome: Progressing   Problem: Clinical Measurements: Goal: Ability to maintain clinical measurements within normal limits will improve Outcome: Progressing Goal: Will remain free from infection Outcome: Progressing Goal: Diagnostic test results will improve Outcome: Progressing Goal: Respiratory complications will improve Outcome: Progressing Goal: Cardiovascular complication will be avoided Outcome: Progressing   Problem: Activity: Goal: Risk for activity intolerance will decrease Outcome: Progressing   Problem: Nutrition: Goal: Adequate nutrition will be maintained Outcome: Progressing   Problem: Coping: Goal: Level of anxiety will decrease Outcome: Progressing   Problem: Elimination: Goal: Will not experience complications related to bowel motility Outcome: Progressing Goal: Will not experience complications related to urinary retention Outcome: Progressing   Problem: Pain Managment: Goal: General experience of comfort will improve Outcome: Progressing   Problem: Safety: Goal: Ability to remain free from injury will improve Outcome: Progressing   Problem: Skin Integrity: Goal: Risk for impaired skin integrity will decrease Outcome: Progressing   Problem: Education: Goal: Ability to describe self-care measures that may prevent or decrease complications (Diabetes Survival Skills Education) will improve Outcome: Progressing Goal: Individualized Educational Video(s) Outcome: Progressing   Problem: Coping: Goal: Ability to adjust to condition or change in health will improve Outcome: Progressing   Problem: Fluid Volume: Goal: Ability to  maintain a balanced intake and output will improve Outcome: Progressing   Problem: Health Behavior/Discharge Planning: Goal: Ability to identify and utilize available resources and services will improve Outcome: Progressing Goal: Ability to manage health-related needs will improve Outcome: Progressing   Problem: Metabolic: Goal: Ability to maintain appropriate glucose levels will improve Outcome: Progressing   Problem: Nutritional: Goal: Maintenance of adequate nutrition will improve Outcome: Progressing Goal: Progress toward achieving an optimal weight will improve Outcome: Progressing   Problem: Skin Integrity: Goal: Risk for impaired skin integrity will decrease Outcome: Progressing   Problem: Tissue Perfusion: Goal: Adequacy of tissue perfusion will improve Outcome: Progressing   Problem: Education: Goal: Knowledge of disease or condition will improve Outcome: Progressing Goal: Knowledge of secondary prevention will improve (SELECT ALL) Outcome: Progressing Goal: Knowledge of patient specific risk factors will improve (INDIVIDUALIZE FOR PATIENT) Outcome: Progressing Goal: Individualized Educational Video(s) Outcome: Progressing   Problem: Coping: Goal: Will verbalize positive feelings about self Outcome: Progressing Goal: Will identify appropriate support needs Outcome: Progressing   Problem: Health Behavior/Discharge Planning: Goal: Ability to manage health-related needs will improve Outcome: Progressing   Problem: Self-Care: Goal: Ability to participate in self-care as condition permits will improve Outcome: Progressing Goal: Verbalization of feelings and concerns over difficulty with self-care will improve Outcome: Progressing Goal: Ability to communicate needs accurately will improve Outcome: Progressing   Problem: Nutrition: Goal: Risk of aspiration will decrease Outcome: Progressing Goal: Dietary intake will improve Outcome: Progressing    Problem: Ischemic Stroke/TIA Tissue Perfusion: Goal: Complications of ischemic stroke/TIA will be minimized Outcome: Progressing   Problem: Education: Goal: Understanding of CV disease, CV risk reduction, and recovery process will improve Outcome: Progressing Goal: Individualized Educational Video(s) Outcome: Progressing   Problem: Activity: Goal: Ability to return to baseline activity level will improve Outcome: Progressing   Problem: Cardiovascular: Goal: Ability to achieve and maintain adequate cardiovascular perfusion will improve Outcome: Progressing Goal: Vascular access site(s) Level 0-1 will be maintained Outcome: Progressing   Problem: Health Behavior/Discharge Planning: Goal: Ability to safely manage   health-related needs after discharge will improve Outcome: Progressing   Problem: Activity: Goal: Ability to tolerate increased activity will improve Outcome: Progressing   

## 2021-08-27 NOTE — Progress Notes (Signed)
Speech Language Pathology Treatment: Cognitive-Linquistic  Patient Details Name: Joshua Donovan MRN: [id number] DOB: 05/13/1955 Today's Date: 08/27/2021 Time: 0814-4818 SLP Time Calculation (min) (ACUTE ONLY): 42 min  Assessment / Plan / Recommendation Clinical Impression  SLP returned to see pt this afternoon with loaner AAC device obtained from [place]. Device was set up on his bedside table and calibrated to his eye gaze. Device was explored with pt, looking at various pages and also exploring how to clear his message and turn tracking on/off. Pt was given opportunities to practice, and he is getting close to his targets, but not always accurate. He will benefit from additional practice. His nurse (and additional nursing staff who work with pt) were brought to his room and educated on basics of device so that they may also help him use it over the weekend. Signage posted in room as well. Will continue to follow.    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 He  presented 08/02/21 with R foot pain and R-sided weakness. Transferred to [facility] 7/25. 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 S/p stent placement to the R vertebrobasilar junction 7/26, failed extubation. Trach and PEG placed 7/28. PMH: DM, GERD, TBI with prior ICH, HLD, HTN      SLP Plan  Continue with current plan of care      Recommendations for follow up therapy are one component of a multi-disciplinary discharge planning process, led by the attending physician.  Recommendations may be updated based on patient status, additional  functional criteria and insurance authorization.    Recommendations         Patient may use Passy-Muir Speech Valve: with SLP only         Oral Care Recommendations: Oral care QID Follow Up Recommendations: Skilled nursing-short term rehab (<3 hours/day) Assistance recommended at discharge: Frequent or constant Supervision/Assistance SLP Visit Diagnosis: Dysarthria and anarthria (R47.1) Plan: Continue with current plan of care           Osie Bond., M.A. [place] Office [phone]  Secure chat preferred   08/27/2021, 3:53 PM

## 2021-08-28 LAB — GLUCOSE, CAPILLARY
Glucose-Capillary: 203 mg/dL — ABNORMAL HIGH (ref 70–99)
Glucose-Capillary: 223 mg/dL — ABNORMAL HIGH (ref 70–99)
Glucose-Capillary: 165 mg/dL — ABNORMAL HIGH (ref 70–99)
Glucose-Capillary: 153 mg/dL — ABNORMAL HIGH (ref 70–99)
Glucose-Capillary: 204 mg/dL — ABNORMAL HIGH (ref 70–99)

## 2021-08-28 MED ORDER — GERHARDT'S BUTT CREAM
TOPICAL_CREAM | Freq: Two times a day (BID) | CUTANEOUS | Status: DC
  Administered 2021-08-29 – 2021-08-30 (×2): 1 via TOPICAL
  Filled 2021-08-28: qty 1

## 2021-08-28 NOTE — Progress Notes (Signed)
RN called me to assess pt.  Noted pt was coughing, but in no distress.  BBSH w/ good air movement, no distress noted. VSS.   I did suction pt for moderate thick tan secretions.  Trach care was done again.  Pt appears comfortable at this time.  RN in room and aware.

## 2021-08-28 NOTE — Progress Notes (Signed)
PROGRESS NOTE    Joshua Donovan  VOZ:[id number] DOB: Jan 26, 1955 DOA: 08/03/2021 PCP: [facility] [place]   Brief Narrative: Patient with history of ICH, type II DM, HTN, HLD presented to [facility] on 7/24 with right hand numbness and weakness, initially found to have a small brainstem stroke and ventral medulla.  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  He was intubated and transferred to [facility] for interventional radiology intervention.  Remained in the intensive care unit.  Now with tracheostomy and PEG tube placement.  Pending LTAC transfer.  7/24 presented to [facility], ventral medulla CVA 7/25 tx to [facility], treated with Cleviprex. 7/26 cerebral angiogram with stent placement to right vertebro-basilar junction, failed extubation, MRI brain> mild extension of stroke, now involving bilateral medial medullary, patent basilar artery and R VBJ stent  7/28 bedside tracheostomy by PCCM and PEG tube placement by general surgery 7/30 Hypertension persist despite adding oral agents, glucose remains elevated as well 7/31 Bleeding around trach site, bright red blood.  Underwent bronchoscopy, Trach Removed, patient orally reintubated, stoma packed. 8/2 ENT took patient to OR for trach redo 8/9 transferred to medical floor on tracheostomy now. 8/12 vomiting after bolus feeding with worsening hypoxia. 8/13 copious bleeding from the heparin injection site controlled with pressure dressing. 8/14 bleeding has resolved.  Trach secretions blood-tinged. 8/15 insurance denied LTAC appeal.  CSW working on SNF.   Assessment and Plan: * Acute stroke of medulla oblongata [facility]) Neurology consulted. Patient initially loaded with aspirin and Brilinta. Cerebral angiogram performed on 7/26 with placement of a stent of right vertebro-basilar junction. -Continue aspirin and Brilinta  Alcohol abuse Initially managed on CIWA. -Continue thiamine,  multivitamin and folic acid  Mixed hyperlipidemia -Continue Lipitor  Uncontrolled type 2 diabetes mellitus with hyperglycemia (HCC) Most recent hemoglobin A1C of 8.3%. uncontrolled with hyper- and hypoglycemia. -Continue Levemir 60 units daily and Novolog 10 units every 4 hours  Hypertension Previously with hypertensive emergency which has now resolved. -Continue clonidine, hydrochlorothiazide and Coreg  Dysphagia Patient is s/p PEG tube and is on tube feeds via PEG. -Continue tube feeds and free water  Acute respiratory failure with hypoxia [facility]) Patient required ICU admission and mechanical ventilation after admission secondary to worsening mentation. Concern for possible aspiration pneumonitis. Patient unable to extubate. Tracheostomy placed on 7/31 and revised by ENT on 8/2 secondary to bleeding. Currently stable.  Tracheostomy complication (HCC)-resolved as of 08/25/2021 In setting of DAPT. ENT consulted for revision. Bleeding resolved.    DVT prophylaxis: SCDs Code Status:   Code Status: Full Code Family Communication: None at bedside Disposition Plan: LTAC pending insurance appeal vs SNF. Patient is medically stable for discharge.   Consultants:  PCCM Neurology ENT  Procedures:    Antimicrobials: Ceftriaxone Cefepime   Subjective: No issues noted overnight.  Objective: BP 139/85 (BP Location: Left Arm)   Pulse 84   Temp 98.6 F (37 C) (Oral)   Resp (!) 25   Ht '5\' 7"'$  (1.702 m)   Wt 80.1 kg   SpO2 97%   BMI 27.66 kg/m   Examination:  General exam: Appears calm and comfortable Respiratory system: Respiratory effort normal.   Data Reviewed: I have personally reviewed following labs and imaging studies  CBC Lab Results  Component Value Date   WBC 8.2 08/25/2021   RBC 4.50 08/25/2021   HGB 12.9 (L) 08/25/2021   HCT 39.4 08/25/2021   MCV 87.6 08/25/2021   MCH 28.7 08/25/2021  PLT 452 (H) 08/25/2021   MCHC 32.7 08/25/2021   RDW 13.1  08/25/2021   LYMPHSABS 1.1 08/23/2021   MONOABS 1.0 08/23/2021   EOSABS 0.2 08/23/2021   BASOSABS 0.1 77/37/3668     Last metabolic panel Lab Results  Component Value Date   NA 138 08/27/2021   K 3.5 08/27/2021   CL 97 (L) 08/27/2021   CO2 30 08/27/2021   BUN 20 08/27/2021   CREATININE 0.75 08/27/2021   GLUCOSE 169 (H) 08/27/2021   GFRNONAA >60 08/27/2021   GFRAA >60 07/20/2015   CALCIUM 9.1 08/27/2021   PHOS 4.4 08/22/2021   PROT 7.1 08/22/2021   ALBUMIN 2.9 (L) 08/22/2021   BILITOT 0.5 08/22/2021   ALKPHOS 43 08/22/2021   AST 28 08/22/2021   ALT 44 08/22/2021   ANIONGAP 11 08/27/2021    GFR: Estimated Creatinine Clearance: 92.1 mL/min (by C-G formula based on SCr of 0.75 mg/dL).  No results found for this or any previous visit (from the past 240 hour(s)).    Radiology Studies: No results found.    LOS: 25 days    Cordelia Poche, MD [facility] 08/28/2021, 1:06 PM   If 7PM-7AM, please contact night-coverage [email or web address]

## 2021-08-28 NOTE — Plan of Care (Signed)
Problem: Education: Goal: Knowledge of General Education information will improve Description: Including pain rating scale, medication(s)/side effects and non-pharmacologic comfort measures Outcome: Progressing   Problem: Health Behavior/Discharge Planning: Goal: Ability to manage health-related needs will improve Outcome: Progressing   Problem: Clinical Measurements: Goal: Ability to maintain clinical measurements within normal limits will improve Outcome: Progressing Goal: Will remain free from infection Outcome: Progressing Goal: Diagnostic test results will improve Outcome: Progressing Goal: Respiratory complications will improve Outcome: Progressing Goal: Cardiovascular complication will be avoided Outcome: Progressing   Problem: Activity: Goal: Risk for activity intolerance will decrease Outcome: Progressing   Problem: Nutrition: Goal: Adequate nutrition will be maintained Outcome: Progressing   Problem: Coping: Goal: Level of anxiety will decrease Outcome: Progressing   Problem: Elimination: Goal: Will not experience complications related to bowel motility Outcome: Progressing Goal: Will not experience complications related to urinary retention Outcome: Progressing   Problem: Pain Managment: Goal: General experience of comfort will improve Outcome: Progressing   Problem: Safety: Goal: Ability to remain free from injury will improve Outcome: Progressing   Problem: Skin Integrity: Goal: Risk for impaired skin integrity will decrease Outcome: Progressing   Problem: Education: Goal: Ability to describe self-care measures that may prevent or decrease complications (Diabetes Survival Skills Education) will improve Outcome: Progressing Goal: Individualized Educational Video(s) Outcome: Progressing   Problem: Coping: Goal: Ability to adjust to condition or change in health will improve Outcome: Progressing   Problem: Fluid Volume: Goal: Ability to  maintain a balanced intake and output will improve Outcome: Progressing   Problem: Health Behavior/Discharge Planning: Goal: Ability to identify and utilize available resources and services will improve Outcome: Progressing Goal: Ability to manage health-related needs will improve Outcome: Progressing   Problem: Metabolic: Goal: Ability to maintain appropriate glucose levels will improve Outcome: Progressing   Problem: Nutritional: Goal: Maintenance of adequate nutrition will improve Outcome: Progressing Goal: Progress toward achieving an optimal weight will improve Outcome: Progressing   Problem: Skin Integrity: Goal: Risk for impaired skin integrity will decrease Outcome: Progressing   Problem: Tissue Perfusion: Goal: Adequacy of tissue perfusion will improve Outcome: Progressing   Problem: Education: Goal: Knowledge of disease or condition will improve Outcome: Progressing Goal: Knowledge of secondary prevention will improve (SELECT ALL) Outcome: Progressing Goal: Knowledge of patient specific risk factors will improve (INDIVIDUALIZE FOR PATIENT) Outcome: Progressing Goal: Individualized Educational Video(s) Outcome: Progressing   Problem: Coping: Goal: Will verbalize positive feelings about self Outcome: Progressing Goal: Will identify appropriate support needs Outcome: Progressing   Problem: Health Behavior/Discharge Planning: Goal: Ability to manage health-related needs will improve Outcome: Progressing   Problem: Self-Care: Goal: Ability to participate in self-care as condition permits will improve Outcome: Progressing Goal: Verbalization of feelings and concerns over difficulty with self-care will improve Outcome: Progressing Goal: Ability to communicate needs accurately will improve Outcome: Progressing   Problem: Nutrition: Goal: Risk of aspiration will decrease Outcome: Progressing Goal: Dietary intake will improve Outcome: Progressing    Problem: Ischemic Stroke/TIA Tissue Perfusion: Goal: Complications of ischemic stroke/TIA will be minimized Outcome: Progressing   Problem: Education: Goal: Understanding of CV disease, CV risk reduction, and recovery process will improve Outcome: Progressing Goal: Individualized Educational Video(s) Outcome: Progressing   Problem: Activity: Goal: Ability to return to baseline activity level will improve Outcome: Progressing   Problem: Cardiovascular: Goal: Ability to achieve and maintain adequate cardiovascular perfusion will improve Outcome: Progressing Goal: Vascular access site(s) Level 0-1 will be maintained Outcome: Progressing   Problem: Health Behavior/Discharge Planning: Goal: Ability to safely manage   health-related needs after discharge will improve Outcome: Progressing   Problem: Activity: Goal: Ability to tolerate increased activity will improve Outcome: Progressing   

## 2021-08-29 LAB — GLUCOSE, CAPILLARY
Glucose-Capillary: 223 mg/dL — ABNORMAL HIGH (ref 70–99)
Glucose-Capillary: 213 mg/dL — ABNORMAL HIGH (ref 70–99)
Glucose-Capillary: 181 mg/dL — ABNORMAL HIGH (ref 70–99)
Glucose-Capillary: 204 mg/dL — ABNORMAL HIGH (ref 70–99)
Glucose-Capillary: 256 mg/dL — ABNORMAL HIGH (ref 70–99)
Glucose-Capillary: 212 mg/dL — ABNORMAL HIGH (ref 70–99)
Glucose-Capillary: 144 mg/dL — ABNORMAL HIGH (ref 70–99)

## 2021-08-29 MED ORDER — GUAIFENESIN 100 MG/5ML PO LIQD
10.0000 mL | ORAL | Status: DC
  Administered 2021-08-29 – 2021-11-26 (×519): 10 mL
  Filled 2021-08-29 (×507): qty 10

## 2021-08-29 NOTE — Progress Notes (Signed)
PROGRESS NOTE    Joshua Donovan  JOA:[id number] DOB: 07-Jun-1955 DOA: 08/03/2021 PCP: [facility] [place]   Brief Narrative: Patient with history of ICH, type II DM, HTN, HLD presented to [facility] on 7/24 with right hand numbness and weakness, initially found to have a small brainstem stroke and ventral medulla.  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  He was intubated and transferred to [facility] for interventional radiology intervention.  Remained in the intensive care unit.  Now with tracheostomy and PEG tube placement.  Pending LTAC transfer.  7/24 presented to [facility], ventral medulla CVA 7/25 tx to [facility], treated with Cleviprex. 7/26 cerebral angiogram with stent placement to right vertebro-basilar junction, failed extubation, MRI brain> mild extension of stroke, now involving bilateral medial medullary, patent basilar artery and R VBJ stent  7/28 bedside tracheostomy by PCCM and PEG tube placement by general surgery 7/30 Hypertension persist despite adding oral agents, glucose remains elevated as well 7/31 Bleeding around trach site, bright red blood.  Underwent bronchoscopy, Trach Removed, patient orally reintubated, stoma packed. 8/2 ENT took patient to OR for trach redo 8/9 transferred to medical floor on tracheostomy now. 8/12 vomiting after bolus feeding with worsening hypoxia. 8/13 copious bleeding from the heparin injection site controlled with pressure dressing. 8/14 bleeding has resolved.  Trach secretions blood-tinged. 8/15 insurance denied LTAC appeal.  CSW working on SNF.   Assessment and Plan: * Acute stroke of medulla oblongata [facility]) Neurology consulted. Patient initially loaded with aspirin and Brilinta. Cerebral angiogram performed on 7/26 with placement of a stent of right vertebro-basilar junction. -Continue aspirin and Brilinta  Alcohol abuse Initially managed on CIWA. -Continue thiamine,  multivitamin and folic acid  Mixed hyperlipidemia -Continue Lipitor  Uncontrolled type 2 diabetes mellitus with hyperglycemia (HCC) Most recent hemoglobin A1C of 8.3%. uncontrolled with hyper- and hypoglycemia. -Continue Levemir 60 units daily and Novolog 10 units every 4 hours  Hypertension Previously with hypertensive emergency which has now resolved. -Continue clonidine, hydrochlorothiazide and Coreg  Dysphagia Patient is s/p PEG tube and is on tube feeds via PEG. -Continue tube feeds and free water  Acute respiratory failure with hypoxia [facility]) Patient required ICU admission and mechanical ventilation after admission secondary to worsening mentation. Concern for possible aspiration pneumonitis. Patient unable to extubate. Tracheostomy placed on 7/31 and revised by ENT on 8/2 secondary to bleeding. Currently stable. -PCCM for trach management -Guaifenesin q4 hours to help with secretions  Tracheostomy complication (HCC)-resolved as of 08/25/2021 In setting of DAPT. ENT consulted for revision. Bleeding resolved.    DVT prophylaxis: SCDs Code Status:   Code Status: Full Code Family Communication: None at bedside Disposition Plan: LTAC pending insurance appeal vs SNF. Patient is medically stable for discharge.   Consultants:  PCCM Neurology ENT  Procedures:    Antimicrobials: Ceftriaxone Cefepime   Subjective: No issues noted overnight per nursing.  Objective: BP 119/78   Pulse 89   Temp 98.7 F (37.1 C) (Axillary)   Resp (!) 22   Ht '5\' 7"'$  (1.702 m)   Wt 82.8 kg   SpO2 98%   BMI 28.59 kg/m   Examination:  General exam: Appears calm and comfortable Respiratory system: Some inspiratory rhonchi. Respiratory effort normal. Cardiovascular system: S1 & S2 heard, RRR. Gastrointestinal system: Abdomen is nondistended, soft and nontender. No organomegaly or masses felt. Normal bowel sounds heard. Central nervous system: Alert. Musculoskeletal: No edema. No  calf tenderness   Data Reviewed: I have personally  reviewed following labs and imaging studies  CBC Lab Results  Component Value Date   WBC 8.2 08/25/2021   RBC 4.50 08/25/2021   HGB 12.9 (L) 08/25/2021   HCT 39.4 08/25/2021   MCV 87.6 08/25/2021   MCH 28.7 08/25/2021   PLT 452 (H) 08/25/2021   MCHC 32.7 08/25/2021   RDW 13.1 08/25/2021   LYMPHSABS 1.1 08/23/2021   MONOABS 1.0 08/23/2021   EOSABS 0.2 08/23/2021   BASOSABS 0.1 85/02/7739     Last metabolic panel Lab Results  Component Value Date   NA 138 08/27/2021   K 3.5 08/27/2021   CL 97 (L) 08/27/2021   CO2 30 08/27/2021   BUN 20 08/27/2021   CREATININE 0.75 08/27/2021   GLUCOSE 169 (H) 08/27/2021   GFRNONAA >60 08/27/2021   GFRAA >60 07/20/2015   CALCIUM 9.1 08/27/2021   PHOS 4.4 08/22/2021   PROT 7.1 08/22/2021   ALBUMIN 2.9 (L) 08/22/2021   BILITOT 0.5 08/22/2021   ALKPHOS 43 08/22/2021   AST 28 08/22/2021   ALT 44 08/22/2021   ANIONGAP 11 08/27/2021    GFR: Estimated Creatinine Clearance: 93.5 mL/min (by C-G formula based on SCr of 0.75 mg/dL).  No results found for this or any previous visit (from the past 240 hour(s)).    Radiology Studies: No results found.    LOS: 26 days    Cordelia Poche, MD [facility] 08/29/2021, 6:40 AM   If 7PM-7AM, please contact night-coverage [email or web address]

## 2021-08-30 LAB — BASIC METABOLIC PANEL
Anion gap: 9 (ref 5–15)
BUN: 21 mg/dL (ref 8–23)
CO2: 31 mmol/L (ref 22–32)
Calcium: 9 mg/dL (ref 8.9–10.3)
Chloride: 99 mmol/L (ref 98–111)
Creatinine, Ser: 0.76 mg/dL (ref 0.61–1.24)
GFR, Estimated: >60 mL/min (ref >60)
Glucose, Bld: 152 mg/dL — ABNORMAL HIGH (ref 70–99)
Potassium: 3.3 mmol/L — ABNORMAL LOW (ref 3.5–5.1)
Sodium: 139 mmol/L (ref 135–145)

## 2021-08-30 LAB — GLUCOSE, CAPILLARY
Glucose-Capillary: 141 mg/dL — ABNORMAL HIGH (ref 70–99)
Glucose-Capillary: 201 mg/dL — ABNORMAL HIGH (ref 70–99)
Glucose-Capillary: 188 mg/dL — ABNORMAL HIGH (ref 70–99)
Glucose-Capillary: 150 mg/dL — ABNORMAL HIGH (ref 70–99)
Glucose-Capillary: 117 mg/dL — ABNORMAL HIGH (ref 70–99)

## 2021-08-30 LAB — TROPONIN I (HIGH SENSITIVITY): Troponin I (High Sensitivity): 7 ng/L (ref <18)

## 2021-08-30 LAB — MAGNESIUM: Magnesium: 1.8 mg/dL (ref 1.7–2.4)

## 2021-08-30 NOTE — Progress Notes (Signed)
Physical Therapy Treatment Patient Details Name: Joshua Donovan MRN: [id number] DOB: [birth year] Today's Date: 08/30/2021   History of Present Illness Pt is a 66 y.o. male who presented 08/02/21 with R foot pain and R-sided weakness. Transferred to [facility] 7/25. 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 S/p stent placement to the R vertebrobasilar junction 7/26, failed extubation. Trach and PEG placed 7/28. S/p bronchoscopic evaluation, oral reintubation and tracheostomy removal with stoma packing 7/31 due to trach site bleeding. S/p trach revision 8/2. PMH: DM, GERD, TBI with prior ICH, HLD, HTN    PT Comments    Patient seen for limited session in bed for ROM of extremities.  Initiated education with daughter on performing when she is able, however focus turned to communication device with SLP assisting to work on tailoring it for his needs.  Will continue with OOB and positioning next session.  Continue to recommend SNF level rehab at d/c.    Recommendations for follow up therapy are one component of a multi-disciplinary discharge planning process, led by the attending physician.  Recommendations may be updated based on patient status, additional functional criteria and insurance authorization.  Follow Up Recommendations  Skilled nursing-short term rehab (<3 hours/day) Can patient physically be transported by private vehicle: No   Assistance Recommended at Discharge Frequent or constant Supervision/Assistance  Patient can return home with the following Assistance with cooking/housework;Two people to help with walking and/or  transfers;Two people to help with bathing/dressing/bathroom;Assistance with feeding;Direct supervision/assist for medications management;Direct supervision/assist for financial management;Assist for transportation;Help with stairs or ramp for entrance   Equipment Recommendations  Other (comment) (TBA)    Recommendations for Other Services       Precautions / Restrictions Precautions Precautions: Fall Precaution Comments: trach collar, peg, SBP < 180, prevalon boots, copious trach secretions, watch HR     Mobility  Bed Mobility               General bed mobility comments: in bed therex today due to working with communication system and lots of family in the room    Transfers                        Ambulation/Gait                   Stairs             Wheelchair Mobility    Modified Rankin (Stroke Patients Only)       Balance                                            Cognition Arousal/Alertness: Awake/alert Behavior During Therapy: WFL for tasks assessed/performed Overall Cognitive Status: Difficult to assess                                 General Comments: initiating using eye gaze system for communication with speech and working to simplify for easiest use  Exercises Other Exercises Other Exercises: PROM L UE and LE in supine and initiated education with daughter starting at wrist, then elbow, then shoulder and moving in multiple planes; then to ankle, knee and hip    General Comments General comments (skin integrity, edema, etc.): Initiated education on ROM with daughter      Pertinent Vitals/Pain Pain Assessment Faces Pain Scale: Hurts even more Pain Location: grimacing with L wrist and knee ROM Pain Descriptors / Indicators: Grimacing Pain Intervention(s): Monitored during session, Repositioned    Home Living                          Prior Function            PT  Goals (current goals can now be found in the care plan section) Progress towards PT goals: Not progressing toward goals - comment (limited due to focus of session on communication with SLP)    Frequency    Min 3X/week      PT Plan Current plan remains appropriate    Co-evaluation              AM-PAC PT "6 Clicks" Mobility   Outcome Measure  Help needed turning from your back to your side while in a flat bed without using bedrails?: Total Help needed moving from lying on your back to sitting on the side of a flat bed without using bedrails?: Total Help needed moving to and from a bed to a chair (including a wheelchair)?: Total Help needed standing up from a chair using your arms (e.g., wheelchair or bedside chair)?: Total Help needed to walk in hospital room?: Total Help needed climbing 3-5 steps with a railing? : Total 6 Click Score: 6    End of Session Equipment Utilized During Treatment: Oxygen Activity Tolerance: Patient limited by pain Patient left: in bed;with family/visitor present   PT Visit Diagnosis: Muscle weakness (generalized) (M62.81);Difficulty in walking, not elsewhere classified (R26.2);Other symptoms and signs involving the nervous system (R29.898);Unsteadiness on feet (R26.81) Hemiplegia - caused by: Cerebral infarction     Time: 0962-8366 PT Time Calculation (min) (ACUTE ONLY): 9 min  Charges:  $Therapeutic Exercise: 8-22 mins                     [name], PT Acute Rehabilitation Services [phone] 08/30/2021    [name] 08/30/2021, 5:57 PM

## 2021-08-30 NOTE — Progress Notes (Signed)
PROGRESS NOTE    Joshua Donovan  GYK:[id number] DOB: 01/21/1955 DOA: 08/03/2021 PCP: [facility] [place]   Brief Narrative: Patient with history of ICH, type II DM, HTN, HLD presented to [facility] on 7/24 with right hand numbness and weakness, initially found to have a small brainstem stroke and ventral medulla.  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  He was intubated and transferred to [facility] for interventional radiology intervention.  Remained in the intensive care unit.  Now with tracheostomy and PEG tube placement.  Pending LTAC transfer.  7/24 presented to [facility], ventral medulla CVA 7/25 tx to [facility], treated with Cleviprex. 7/26 cerebral angiogram with stent placement to right vertebro-basilar junction, failed extubation, MRI brain> mild extension of stroke, now involving bilateral medial medullary, patent basilar artery and R VBJ stent  7/28 bedside tracheostomy by PCCM and PEG tube placement by general surgery 7/30 Hypertension persist despite adding oral agents, glucose remains elevated as well 7/31 Bleeding around trach site, bright red blood.  Underwent bronchoscopy, Trach Removed, patient orally reintubated, stoma packed. 8/2 ENT took patient to OR for trach redo 8/9 transferred to medical floor on tracheostomy now. 8/12 vomiting after bolus feeding with worsening hypoxia. 8/13 copious bleeding from the heparin injection site controlled with pressure dressing. 8/14 bleeding has resolved.  Trach secretions blood-tinged. 8/15 insurance denied LTAC appeal.  CSW working on SNF.   Assessment and Plan: * Acute stroke of medulla oblongata [facility]) Neurology consulted. Patient initially loaded with aspirin and Brilinta. Cerebral angiogram performed on 7/26 with placement of a stent of right vertebro-basilar junction. -Continue aspirin and Brilinta  Alcohol abuse Initially managed on CIWA. -Continue thiamine,  multivitamin and folic acid  Mixed hyperlipidemia -Continue Lipitor  Uncontrolled type 2 diabetes mellitus with hyperglycemia (HCC) Most recent hemoglobin A1C of 8.3%. uncontrolled with hyper- and hypoglycemia. -Continue Levemir 60 units daily and Novolog 10 units every 4 hours  Hypertension Previously with hypertensive emergency which has now resolved. -Continue clonidine, hydrochlorothiazide and Coreg  Dysphagia Patient is s/p PEG tube and is on tube feeds via PEG. -Continue tube feeds and free water  Acute respiratory failure with hypoxia [facility]) Patient required ICU admission and mechanical ventilation after admission secondary to worsening mentation. Concern for possible aspiration pneumonitis. Patient unable to extubate. Tracheostomy placed on 7/31 and revised by ENT on 8/2 secondary to bleeding. Currently stable. -PCCM for trach management -Guaifenesin q4 hours to help with secretions  Tracheostomy complication (HCC)-resolved as of 08/25/2021 In setting of DAPT. ENT consulted for revision. Bleeding resolved.    DVT prophylaxis: SCDs Code Status:   Code Status: Full Code Family Communication: None at bedside Disposition Plan: LTAC pending insurance appeal vs SNF. Patient is medically stable for discharge.   Consultants:  PCCM Neurology ENT  Procedures:    Antimicrobials: Ceftriaxone Cefepime   Subjective: No issues noted overnight.  Objective: BP 118/74   Pulse 91   Temp 98.5 F (36.9 C) (Axillary)   Resp (!) 24   Ht '5\' 7"'$  (1.702 m)   Wt 80.2 kg   SpO2 98%   BMI 27.69 kg/m   Examination:  General exam: Appears calm and comfortable Respiratory system: Rhonchi on auscultation. Respiratory effort normal. Cardiovascular system: S1 & S2 heard, RRR. Gastrointestinal system: Abdomen is nondistended, soft and nontender. Normal bowel sounds heard.   Data Reviewed: I have personally reviewed following labs and imaging studies  CBC Lab Results   Component Value Date   WBC  8.2 08/25/2021   RBC 4.50 08/25/2021   HGB 12.9 (L) 08/25/2021   HCT 39.4 08/25/2021   MCV 87.6 08/25/2021   MCH 28.7 08/25/2021   PLT 452 (H) 08/25/2021   MCHC 32.7 08/25/2021   RDW 13.1 08/25/2021   LYMPHSABS 1.1 08/23/2021   MONOABS 1.0 08/23/2021   EOSABS 0.2 08/23/2021   BASOSABS 0.1 95/63/8756     Last metabolic panel Lab Results  Component Value Date   NA 138 08/27/2021   K 3.5 08/27/2021   CL 97 (L) 08/27/2021   CO2 30 08/27/2021   BUN 20 08/27/2021   CREATININE 0.75 08/27/2021   GLUCOSE 169 (H) 08/27/2021   GFRNONAA >60 08/27/2021   GFRAA >60 07/20/2015   CALCIUM 9.1 08/27/2021   PHOS 4.4 08/22/2021   PROT 7.1 08/22/2021   ALBUMIN 2.9 (L) 08/22/2021   BILITOT 0.5 08/22/2021   ALKPHOS 43 08/22/2021   AST 28 08/22/2021   ALT 44 08/22/2021   ANIONGAP 11 08/27/2021    GFR: Estimated Creatinine Clearance: 92.1 mL/min (by C-G formula based on SCr of 0.75 mg/dL).  No results found for this or any previous visit (from the past 240 hour(s)).    Radiology Studies: No results found.    LOS: 27 days    Cordelia Poche, MD [facility] 08/30/2021, 9:45 AM   If 7PM-7AM, please contact night-coverage [email or web address]

## 2021-08-30 NOTE — TOC Progression Note (Signed)
Transition of Care [facility]) - Progression Note    Patient Details  Name: Joshua Donovan MRN: [id number] Date of Birth: 11-Jul-1955  Transition of Care [facility]) CM/SW [place], [place] Phone Number: 08/30/2021, 10:20 AM  Clinical Narrative:     Patient has no bed offers   TOC continue to follow and assist with discharge planning.   Thurmond Butts, MSW, LCSW Clinical Social Worker    Expected Discharge Plan: Long Term Acute Care (LTAC) Barriers to Discharge: Continued Medical Work up  Expected Discharge Plan and Services Expected Discharge Plan: Long Term Acute Care (LTAC)   Discharge Planning Services: CM Consult Post Acute Care Choice: Long Term Acute Care (LTAC) Living arrangements for the past 2 months: Single Family Home                                       Social Determinants of Health (SDOH) Interventions    Readmission Risk Interventions     No data to display

## 2021-08-30 NOTE — Progress Notes (Signed)
Speech Language Pathology Treatment: Cognitive-Linquistic (AAC)  Patient Details Name: Floy Riegler MRN: [id number] DOB: [birth year] Today's Date: 08/30/2021 Time: 3151-7616 SLP Time Calculation (min) (ACUTE ONLY): 30 min  Assessment / Plan / Recommendation Clinical Impression  Session focused on re-calibrating [employer] 1400 eye gaze system, lengthening time needed to dwell on targets to allow for more intentional selection, and helping Mr. Shinault to find items on page and select various letters/phrases. Accuracy is still hampered by number of choices and problems with access. Reached out to rep from [employer], who arranged for the loan, to arrange an education session with pt/staff.  Discussed basics of system/plan with daughter Joelene Millin, who was present. SLP will continue to follow.   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 He  presented 08/02/21 with R foot pain and R-sided weakness. Transferred to [facility] 7/25. 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 S/p stent placement to the R vertebrobasilar junction 7/26, failed extubation. Trach and PEG placed 7/28. PMH: DM, GERD, TBI with prior ICH, HLD, HTN      SLP Plan  Continue with current plan of care      Recommendations for follow up therapy are one component of a multi-disciplinary discharge planning process, led by the attending physician.  Recommendations may be updated based on patient status, additional functional criteria and insurance authorization.    Recommendations                   Oral Care Recommendations: Oral care QID Follow Up Recommendations:  Skilled nursing-short term rehab (<3 hours/day) Assistance recommended at discharge: Frequent or constant Supervision/Assistance SLP Visit Diagnosis: Dysarthria and anarthria (R47.1) Plan: Continue with current plan of care         Azrielle Springsteen L. Tivis Ringer, MA CCC/SLP Clinical Specialist - Acute Care SLP Acute Rehabilitation Services Office number [phone]   Juan Quam Laurice  08/30/2021, 6:01 PM

## 2021-08-30 NOTE — Progress Notes (Signed)
25 beat run Vtach.  Patient asymptomatic.  Spoke to Dr Lonny Prude on phone and notified.  Awaiting orders.  Will continue to monitor

## 2021-08-30 NOTE — Progress Notes (Signed)
NAME:  Joshua Donovan, MRN:  [id number], DOB:  13-Mar-1955, LOS: 37 ADMISSION DATE:  08/03/2021, CONSULTATION DATE:  7/26 REFERRING MD:  Colvin Caroli FOR CONSULT:  CVA   History of Present Illness:  66 y/o male presented to [facility] on 7/24 with R hand numbness and weakness, found to have small brainstem stroke in ventral medulla.  Symptoms worsened and required transfer to [facility] for neuro IR intervention where he had a stent placed in R vertebrovasilar junction.  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  He had a tracheostomy performed on 7/26.    Pertinent  Medical History  TBI with ICH, DM, HTN, HLD  Significant Hospital Events: Including procedures, antibiotic start and stop dates in addition to other pertinent events    7/24 presented to [facility], ventral medulla CVA 7/25 tx to [facility] 7/26 cerebral angiogram with stent placement to right vertebro-basilar junction, failed extubation, left radial aline MRI brain> mild extension of stroke, now involving bilateral medial medullary, patent basilar artery and R VBJ stent  7/28 underwent bedside percutaneous tracheostomy and PEG tube placement, tolerated well 7/29 no acute issues overnight currently on SBT trial this a.m. and tolerating well, Cleviprex resumed earlier this a.m. due to severe hypertension 7/30 Hypertension persist despite adding oral agents, glucose remains elevated as well 7/31 Bleeding around trach site, bright red blood. Copious bloody secretions. Cleviprex off, SBPs 150s-160s. Basal insulin/TF coverage increased. Cefepime deescalated to ceftriaxone. CXR stable. Later in afternoon, continued peritrach bleeding. Bronchoscopic eval completed with intratracheal bleeding associated with trach. Removed, patient orally reintubated, stoma packed. 8/1 ENT consulted for trach revision. Lightly sedated. Vent full support/PRVC, given fatigue following events of yesterday. ENT attempted to evaluate trach at bedside, could not pass  scope. Plan for OR 8/2. ASA/Brilinta held. 8/2 to OR for trach redo 8/6 trach collar during day shift, back on vent overnight 8/10 Hemoptysis, blood with suctioning > improved 8/11 8/14 on 8L / 35% ATC   Interim History / Subjective:   No acute changes.  Appears comfortable lying in bed.  Opens eyes to voice will follow basic commands with blinking of eyes and looking right to left at a yes/no symbols.  Objective   Blood pressure 118/74, pulse 91, temperature 98.5 F (36.9 C), temperature source Axillary, resp. rate (!) 24, height '5\' 7"'$  (1.702 m), weight 80.2 kg, SpO2 98 %.    FiO2 (%):  [28 %] 28 %   Intake/Output Summary (Last 24 hours) at 08/30/2021 0853 Last data filed at 08/30/2021 0400 Gross per 24 hour  Intake 1270 ml  Output 950 ml  Net 320 ml   Filed Weights   08/28/21 0413 08/29/21 0430 08/30/21 0350  Weight: 80.1 kg 82.8 kg 80.2 kg    Examination: Chronically ill-appearing gentleman lying in bed HEENT: Trach in place, he likes to keep his neck looking straight down and tends to apply quite a bit of pressure to the bolster of the tracheostomy.  I discussed this with his nurse and hopefully then try to keep his head lifted back to keep pressure off of the device in between the skin to prevent skin breakdown. 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 Not a candidate for decannulation at this time due to significant amount of secretion management and likely inability to protect airway if was  removed. Continue to wean O2 as tolerated to maintain sats >90% Amazing work by [employer] using the AAC Device.  I spoke with nursing to make sure to keep padding around the trach bolster and help keep his neck extended in an effort to prevent added pressure to the skin.    Garner Nash, DO [facility] 08/30/2021 8:54 AM

## 2021-08-31 DIAGNOSIS — I4729 Other ventricular tachycardia: Secondary | ICD-10-CM

## 2021-08-31 LAB — GLUCOSE, CAPILLARY
Glucose-Capillary: 128 mg/dL — ABNORMAL HIGH (ref 70–99)
Glucose-Capillary: 135 mg/dL — ABNORMAL HIGH (ref 70–99)
Glucose-Capillary: 137 mg/dL — ABNORMAL HIGH (ref 70–99)
Glucose-Capillary: 142 mg/dL — ABNORMAL HIGH (ref 70–99)
Glucose-Capillary: 145 mg/dL — ABNORMAL HIGH (ref 70–99)
Glucose-Capillary: 170 mg/dL — ABNORMAL HIGH (ref 70–99)

## 2021-08-31 LAB — POTASSIUM: Potassium: 3.2 mmol/L — ABNORMAL LOW (ref 3.5–5.1)

## 2021-08-31 MED ORDER — GUAIFENESIN 100 MG/5ML PO LIQD
5.0000 mL | ORAL | Status: DC | PRN
Start: 2021-08-31 — End: 2021-08-31

## 2021-08-31 MED ORDER — MAGNESIUM SULFATE 2 GM/50ML IV SOLN
2.0000 g | Freq: Once | INTRAVENOUS | Status: AC
  Administered 2021-08-31: 2 g via INTRAVENOUS
  Filled 2021-08-31: qty 50

## 2021-08-31 MED ORDER — POTASSIUM CHLORIDE 20 MEQ PO PACK
40.0000 meq | PACK | ORAL | Status: AC
  Administered 2021-08-31 (×2): 40 meq
  Filled 2021-08-31 (×2): qty 2

## 2021-08-31 MED ORDER — BANATROL TF EN LIQD
60.0000 mL | Freq: Two times a day (BID) | ENTERAL | Status: DC
  Administered 2021-08-31 – 2021-10-19 (×97): 60 mL
  Filled 2021-08-31 (×99): qty 60

## 2021-08-31 MED ORDER — ZINC OXIDE 40 % EX OINT
TOPICAL_OINTMENT | Freq: Three times a day (TID) | CUTANEOUS | Status: AC
Start: 2021-08-31 — End: 2021-09-20
  Administered 2021-08-31 – 2021-09-20 (×16): 1 via TOPICAL
  Filled 2021-08-31 (×4): qty 57

## 2021-08-31 NOTE — Progress Notes (Signed)
Nutrition Follow-up  DOCUMENTATION CODES:   Not applicable  INTERVENTION:   Tube Feeding via PEG:  Jevity 1.5 @ 60 ml/hr 60 ml Pro-Source TF 20 daily  Provides: 2240 kcal, 110 grams protein, and 1080 ml free water.   150 ml free water every 4 hours Total free water: 1980 ml   MVI with minerals daily   D/C nutrisource fiber Banatrol BID  NUTRITION DIAGNOSIS:   Inadequate oral intake related to acute illness as evidenced by NPO status. Ongoing.   GOAL:   Patient will meet greater than or equal to 90% of their needs Met with TF at goal  MONITOR:   Vent status, Labs, Weight trends, TF tolerance  REASON FOR ASSESSMENT:   Consult Enteral/tube feeding initiation and management  ASSESSMENT:   66 yo male admitted post brain stem stroke involving ventral medulla requiring stent placement to right vertebro-basilar junction. PMH includes HTN, TBI, DM, HLD  Pt remains on trach collar with 5L O2  Pt with medial medullary syndrome with quadriplegia/quadriparesis  Denied by [facility], plan for SNF after trach 30 days; social work is working with family for placement  7/24 Admitted to [facility], ventral medulla CVA 7/25 Tx to [facility] 7/26 Cerebral angiogram with stent placement to right vertebro-basilar junction, failed extubation, reintubated 7/28 s/p PEG placement 8/2 s/p trach revision with ENT in the OR 8/9 tx to floor  Labs: K 3.2 CBGs 137-170  Meds: nutrisource fiber BID, folic acid, ss novolog, 6 units novolog every 4 hours, 60 units levemir daily, MVI with minerals, Thiamine   Diet Order:   Diet Order             Diet NPO time specified  Diet effective midnight                   EDUCATION NEEDS:   Not appropriate for education at this time  Skin:  Skin Assessment: Reviewed RN Assessment  Last BM:  8/22 large  Height:   Ht Readings from Last 1 Encounters:  08/04/21 5' 7"  (1.702 m)    Weight:   Wt Readings from Last 1 Encounters:  08/31/21 80.5 kg     BMI:  Body mass index is 27.8 kg/m.  Estimated Nutritional Needs:   Kcal:  2000-2200 kcals  Protein:  100-115 g  Fluid:  >/= 2 L  Colin Ellers P., RD, LDN, CNSC See AMiON for contact information

## 2021-08-31 NOTE — Progress Notes (Signed)
Speech Language Pathology Treatment: Cognitive-Linquistic  Patient Details Name: Joshua Donovan MRN: [id number] DOB: 06-13-1955 Today's Date: 08/31/2021 Time: 1000-1115 SLP Time Calculation (min) (ACUTE ONLY): 75 min  Assessment / Plan / Recommendation Clinical Impression  SLP worked on device calibration with pt. Pt able to lock onto target in a game with 50% accuracy if device slightly right, 100% accuracy if fully at midline with table on pts left. Worked on Doctor, general practice. Will meet with AAC specialist tomorrow to further calibrate device to pts needs as he is unsuccessful with current set up.   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 He  presented 08/02/21 with R foot pain and R-sided weakness. Transferred to [facility] 7/25. 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 S/p stent placement to the R vertebrobasilar junction 7/26, failed extubation. Trach and PEG placed 7/28. PMH: DM, GERD, TBI with prior ICH, HLD, HTN      SLP Plan  Continue with current plan of care      Recommendations for follow up therapy are one component of a multi-disciplinary discharge planning process, led by the attending physician.  Recommendations may be updated based on patient status, additional functional criteria and insurance authorization.    Recommendations                   Plan: Continue with current plan of care           Tahirah Sara, [name]  08/31/2021, 11:35 AM

## 2021-08-31 NOTE — TOC Progression Note (Addendum)
Transition of Care [facility]) - Progression Note    Patient Details  Name: Joshua Donovan MRN: [id number] Date of Birth: 03-Dec-1955  Transition of Care [facility]) CM/SW [place], [place] Phone Number: 08/31/2021, 2:18 PM  Clinical Narrative:     Received call from patient's daughter- provided update no bed offers at this time, will re-submit referrals for SNF once patient has had Trach for 30 days. Family requested to be  re-considered for [facility]. CSW suggested family apply for Medicaid for secondary coverage. Famiy was agreeable and states understanding.  TOC will continue to follow and assist with discharge planning.  Thurmond Butts, MSW, LCSW Clinical Social Worker    Expected Discharge Plan: Long Term Acute Care (LTAC) Barriers to Discharge: Continued Medical Work up  Expected Discharge Plan and Services Expected Discharge Plan: Long Term Acute Care (LTAC)   Discharge Planning Services: CM Consult Post Acute Care Choice: Long Term Acute Care (LTAC) Living arrangements for the past 2 months: Single Family Home                                       Social Determinants of Health (SDOH) Interventions    Readmission Risk Interventions     No data to display

## 2021-08-31 NOTE — Progress Notes (Signed)
Physical Therapy Treatment Patient Details Name: Joshua Donovan MRN: [id number] DOB: 09-Sep-1955 Today's Date: 08/31/2021   History of Present Illness Pt is a 66 y.o. male who presented 08/02/21 with R foot pain and R-sided weakness. Transferred to [facility] 7/25. 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 S/p stent placement to the R vertebrobasilar junction 7/26, failed extubation. Trach and PEG placed 7/28. S/p bronchoscopic evaluation, oral reintubation and tracheostomy removal with stoma packing 7/31 due to trach site bleeding. S/p trach revision 8/2. PMH: DM, GERD, TBI with prior ICH, HLD, HTN    PT Comments    Patient progressing slowly.  Noted some spasticity L LE in responses to initial touch/movement.  Patient attempting to use eye gaze system, though difficult due to some vertical nystagmus today.  Doing well with mouthing single word responses with observer needing increased time and repeats for good understanding.  Remains appropriate for SNF level rehab.  May trial e-stim again next session.    Recommendations for follow up therapy are one component of a multi-disciplinary discharge planning process, led by the attending physician.  Recommendations may be updated based on patient status, additional functional criteria and insurance authorization.  Follow Up Recommendations  Skilled nursing-short term rehab (<3 hours/day) Can patient physically be transported by private vehicle: No   Assistance Recommended at Discharge Frequent or constant Supervision/Assistance  Patient can return home with the following Assistance with  cooking/housework;Two people to help with walking and/or transfers;Two people to help with bathing/dressing/bathroom;Assistance with feeding;Direct supervision/assist for medications management;Direct supervision/assist for financial management;Assist for transportation;Help with stairs or ramp for entrance   Equipment Recommendations  Other (comment) (TBA)    Recommendations for Other Services       Precautions / Restrictions Precautions Precautions: Fall Precaution Comments: trach collar, peg, SBP < 180, prevalon boots, copious trach secretions, watch HR     Mobility  Bed Mobility Overal bed mobility: Needs Assistance Bed Mobility: Rolling Rolling: Total assist, +2 for physical assistance, +2 for safety/equipment         General bed mobility comments: rolling to place lift pad    Transfers Overall transfer level: Needs assistance   Transfers: Bed to chair/wheelchair/BSC             General transfer comment: up to chair via lift; increased time for positioning for good head support and for safety Transfer via Lift Equipment: Maximove  Ambulation/Gait                   Stairs             Wheelchair Mobility    Modified Rankin (Stroke Patients Only) Modified Rankin (Stroke Patients Only) Pre-Morbid Rankin Score: No symptoms Modified Rankin: Severe disability     Balance Overall balance assessment: Needs assistance   Sitting balance-Leahy Scale: Zero Sitting balance - Comments: extra time for positioning in chair for proper head support                                    Cognition Arousal/Alertness: Awake/alert Behavior During Therapy: WFL for tasks assessed/performed Overall Cognitive Status: Difficult  to assess                                 General Comments: continues to attempt to use eye gaze system, but limited due to some vertical nystagmus today; mouthing response to questions and patient to insure  observers understanding        Exercises Other Exercises Other Exercises: Few ROM activities in supine for ankles and for rolling for pad placement; noted L LE with spasm x 1 into flexion in respose to touch    General Comments        Pertinent Vitals/Pain Pain Assessment Faces Pain Scale: Hurts a little bit Pain Location: generalized with mobility Pain Descriptors / Indicators: Grimacing Pain Intervention(s): Monitored during session    Home Living                          Prior Function            PT Goals (current goals can now be found in the care plan section) Progress towards PT goals: Progressing toward goals    Frequency    Min 3X/week      PT Plan Current plan remains appropriate    Co-evaluation              AM-PAC PT "6 Clicks" Mobility   Outcome Measure  Help needed turning from your back to your side while in a flat bed without using bedrails?: Total Help needed moving from lying on your back to sitting on the side of a flat bed without using bedrails?: Total Help needed moving to and from a bed to a chair (including a wheelchair)?: Total Help needed standing up from a chair using your arms (e.g., wheelchair or bedside chair)?: Total Help needed to walk in hospital room?: Total Help needed climbing 3-5 steps with a railing? : Total 6 Click Score: 6    End of Session Equipment Utilized During Treatment: Oxygen;Other (comment) (lift)   Patient left: in chair;with call bell/phone within reach;with chair alarm set Nurse Communication: Mobility status;Need for lift equipment PT Visit Diagnosis: Muscle weakness (generalized) (M62.81);Difficulty in walking, not elsewhere classified (R26.2);Other symptoms and signs involving the nervous system (R29.898) Hemiplegia - caused by: Cerebral infarction     Time: 0254-2706 PT Time Calculation (min) (ACUTE ONLY): 26 min  Charges:  $Therapeutic Activity: 23-37 mins                      [name], PT Acute Rehabilitation Services Office:[phone] 08/31/2021    [name] 08/31/2021, 5:03 PM

## 2021-08-31 NOTE — Assessment & Plan Note (Signed)
Patient with intermittent Vtach. Most recently with 25 beats of vtach on 8/21. QTc of 424 msec. Potassium of 3.3 > 3.2. Magnesium of 1.8. -Replete potassium and magnesium -Cardiology consult

## 2021-08-31 NOTE — Progress Notes (Signed)
PROGRESS NOTE    Joshua Donovan  CWC:[id number] DOB: 1955-02-24 DOA: 08/03/2021 PCP: [facility]   Brief Narrative: Patient with history of ICH, type II DM, HTN, HLD presented to [facility] on 7/24 with right hand numbness and weakness, initially found to have a small brainstem stroke and ventral medulla.  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  He was intubated and transferred to [facility] for interventional radiology intervention.  Remained in the intensive care unit.  Now with tracheostomy and PEG tube placement.  Pending LTAC transfer.  7/24 presented to [facility], ventral medulla CVA 7/25 tx to [facility], treated with Cleviprex. 7/26 cerebral angiogram with stent placement to right vertebro-basilar junction, failed extubation, MRI brain> mild extension of stroke, now involving bilateral medial medullary, patent basilar artery and R VBJ stent  7/28 bedside tracheostomy by PCCM and PEG tube placement by general surgery 7/30 Hypertension persist despite adding oral agents, glucose remains elevated as well 7/31 Bleeding around trach site, bright red blood.  Underwent bronchoscopy, Trach Removed, patient orally reintubated, stoma packed. 8/2 ENT took patient to OR for trach redo 8/9 transferred to medical floor on tracheostomy now. 8/12 vomiting after bolus feeding with worsening hypoxia. 8/13 copious bleeding from the heparin injection site controlled with pressure dressing. 8/14 bleeding has resolved.  Trach secretions blood-tinged. 8/15 insurance denied LTAC appeal.  CSW working on SNF. 8/16>8/22: 25 beat run of NSVT on 8/21   Assessment and Plan: * Acute stroke of medulla oblongata [facility]) Neurology consulted. Patient initially loaded with aspirin and Brilinta. Cerebral angiogram performed on 7/26 with placement of a stent of right vertebro-basilar junction. -Continue aspirin and Brilinta  Alcohol abuse Initially  managed on CIWA. -Continue thiamine, multivitamin and folic acid  Mixed hyperlipidemia -Continue Lipitor  Uncontrolled type 2 diabetes mellitus with hyperglycemia (HCC) Most recent hemoglobin A1C of 8.3%. uncontrolled with hyper- and hypoglycemia. -Continue Levemir 60 units daily and Novolog 10 units every 4 hours  Hypertension Previously with hypertensive emergency which has now resolved. -Continue clonidine, amlodipine, hydrochlorothiazide and Coreg  NSVT (nonsustained ventricular tachycardia) (HCC) Patient with intermittent Vtach. Most recently with 25 beats of vtach on 8/21. QTc of 424 msec. Potassium of 3.3 > 3.2. Magnesium of 1.8. -Replete potassium and magnesium -Cardiology consult  Dysphagia Patient is s/p PEG tube and is on tube feeds via PEG. -Continue tube feeds and free water  Acute respiratory failure with hypoxia [facility]) Patient required ICU admission and mechanical ventilation after admission secondary to worsening mentation. Concern for possible aspiration pneumonitis. Patient unable to extubate. Tracheostomy placed on 7/31 and revised by ENT on 8/2 secondary to bleeding. Currently stable. -PCCM for trach management -Guaifenesin q4 hours to help with secretions  Tracheostomy complication (HCC)-resolved as of 08/25/2021 In setting of DAPT. ENT consulted for revision. Bleeding resolved.    DVT prophylaxis: SCDs Code Status:   Code Status: Full Code Family Communication: Daughter on telephone Disposition Plan: LTAC pending insurance appeal vs SNF. Patient is medically stable for discharge.   Consultants:  PCCM Neurology ENT  Procedures:  Transthoracic Echocardiogram  Antimicrobials: Ceftriaxone Cefepime   Subjective: NSVT overnight. No other issues.  Objective: BP 139/81   Pulse 81   Temp 98.3 F (36.8 C) (Oral)   Resp (!) 30   Ht '5\' 7"'$  (1.702 m)   Wt 80.5 kg   SpO2 98%   BMI 27.80 kg/m   Examination:  General exam: Appears calm and  comfortable Respiratory system: Clear  to auscultation. Respiratory effort normal. Cardiovascular system: S1 & S2 heard, RRR. No murmurs. Gastrointestinal system: Abdomen is nondistended, soft and nontender. Normal bowel sounds heard. Central nervous system: Alert. Musculoskeletal: No edema. No calf tenderness    Data Reviewed: I have personally reviewed following labs and imaging studies  CBC Lab Results  Component Value Date   WBC 8.2 08/25/2021   RBC 4.50 08/25/2021   HGB 12.9 (L) 08/25/2021   HCT 39.4 08/25/2021   MCV 87.6 08/25/2021   MCH 28.7 08/25/2021   PLT 452 (H) 08/25/2021   MCHC 32.7 08/25/2021   RDW 13.1 08/25/2021   LYMPHSABS 1.1 08/23/2021   MONOABS 1.0 08/23/2021   EOSABS 0.2 08/23/2021   BASOSABS 0.1 02/58/5277     Last metabolic panel Lab Results  Component Value Date   NA 139 08/30/2021   K 3.2 (L) 08/31/2021   CL 99 08/30/2021   CO2 31 08/30/2021   BUN 21 08/30/2021   CREATININE 0.76 08/30/2021   GLUCOSE 152 (H) 08/30/2021   GFRNONAA >60 08/30/2021   GFRAA >60 07/20/2015   CALCIUM 9.0 08/30/2021   PHOS 4.4 08/22/2021   PROT 7.1 08/22/2021   ALBUMIN 2.9 (L) 08/22/2021   BILITOT 0.5 08/22/2021   ALKPHOS 43 08/22/2021   AST 28 08/22/2021   ALT 44 08/22/2021   ANIONGAP 9 08/30/2021    GFR: Estimated Creatinine Clearance: 92.4 mL/min (by C-G formula based on SCr of 0.76 mg/dL).  No results found for this or any previous visit (from the past 240 hour(s)).    Radiology Studies: No results found.    LOS: 28 days    Cordelia Poche, MD [facility] 08/31/2021, 12:25 PM   If 7PM-7AM, please contact night-coverage [email or web address]

## 2021-08-31 NOTE — Consult Note (Signed)
[facility] Nurse Consult Note: Reason for Consult:partial thickness wound noted to right buttock. Patient incontinent of large bowel movement at the time of my assessment and stool is obscuring wound.  Wound type: Pressure vs moisture associated skin damage/irritant contact dermatitis due to incontinence of stool vs shear incurred during positioning  ICD-10 CM Codes for Irritant Dermatitis L24A2 - Due to fecal, urinary or dual incontinence  Pressure Injury POA:N/A Measurement: 2cm x 1.6cm x 0.1cm Wound WIO:XBDZ, moist Drainage (amount, consistency, odor) scant serous Periwound: intact, macerated Dressing procedure/placement/frequency: Patient is on a mattress replacement with low air loss feature and is being turned and repositioned from side to side. Pressure redistribution heel boots are in place. Timely incontinence care is being performed and the patient is wearing an external urinary male catheter for urinary incontinence. A prophylactic foam dressing is in place over an intact sacrum.  I will provide Nursing guidance for use of Desitin moisture barrier cream to be applied three times daily and PRN soiling. I will discontinue the order for Gerhart's Butt Cream from 8/19 and provided at twice daily as the Desitin is a thicker moisture barrier and will provide added protection from stool.  [place] nursing team will not follow, but will remain available to this patient, the nursing and medical teams.  Please re-consult if needed.  Thank you for inviting [place] to participate in this patient's Plan of Care.  Maudie Flakes, MSN, RN, CNS, [place], Serita Grammes, [employer], [employer] phone:  [phone]

## 2021-08-31 NOTE — Consult Note (Addendum)
Cardiology Consultation:   Patient ID: Joshua Donovan MRN: [id number]; DOB: 03-28-1955  Admit date: 08/03/2021 Date of Consult: 08/31/2021  PCP:  [facility] [place] [facility] Cardiologist:  New (Dr. Gwenlyn Found)  Patient Profile:   Joshua Donovan is a 66 y.o. male with a history of CAD with remote stenting, hypertension, hyperlipidemia, type 2 diabetes mellitus, traumatic brain injury with ICH, GERD, and alcohol abuse who presented to [facility] ED on 08/02/2021 with right hand numbness and weakness and was found to have a small brainstem stroke involving the ventral medulla. He was transferred to [facility] for further management and possible IR intervention. He ultimately underwent stenting of right vertebrobasilar junction on 08/04/2021 but failed extubation and required reintubation in the PACU. He has had a prolonged hospitalization and underwent tracheostomy and PEG tube placement on 08/06/2021. He had developed bleeding around his tracheostomy requiring bronchoscopy and removal of tracheostomy and reintubation on 08/09/2021. He was then taken to the OR on 08/11/2021 with ENT for redo tracheostomy on 08/11/2021. Cardiology was consulted on 08/31/2021 for further evaluation of non-sustained VT at the request of Dr. Lonny Prude.   History of Present Illness:   Joshua Donovan is a 66 year old male with the above history. He presented to the [facility] ED on 08/02/2021 further evaluation of right foot pain and right hand numbness.  His work-up ended up revealing an acute ischemic infarcts of the ventral medulla on brain MRI.  Head/neck CTA also showed severe intracranial atherosclerosis with near occlusion of the vertebrobasilar junction, occluded distal left V4 segment, near occlusion bilateral PCA P2 segments and distal right ACA A2, moderate stenosis of mid basilar artery, mild to moderate right MCA M1 stenosis, and bulky soft plaque or thrombus in the supraclinoid left ICA with up to moderate  stenosis.  Of note, he was noted to be markedly elevated on admission with BP as high as 219/96.  He was initially started on DAPT with Aspirin and Brilinta and transferred to [facility] for possible IR intervention.  He was seen by IR and ultimately underwent stenting of the right vertebral basilar junction on 08/04/2021.  He tolerated the procedure well but unfortunately failed extubation and required reintubation in the PACU.  He had a prolonged hospital stay since that time and ultimately required placement of tracheostomy and PEG tube on 08/06/2021.  He then developed bleeding around his tracheostomy requiring bronchoscopy and removal of tracheostomy and reintubation on 08/09/2021.  He was taken back to the OR on 08/11/2021 with ENT.  Patient initially required Cleviprex drip for control of his hypertension but this was able to be weaned off and he was started on PO medications.  BP currently well controlled on loaded pain, Coreg, clonidine, and HCTZ.  Cardiology consulted today for further evaluation of nonsustained VT.  Echo earlier this admission on 08/03/2021 showed LVEF of 60-65% with no regional wall motion abnormalities, moderate LVH, and grade 1 diastolic dysfunction.   At the time of this evaluation, patient is resting comfortably in no acute distress. Patient's wife and daughter (on the phone) provided the history as patient is still unable to speak at this time (other than mouthing a couple of words). He has been working with Speech Therapy on using the [facility] device. Patient has of CAD and reportedly had a couple of stents placed close to 20 years ago at [facility] before they were associated with [facility]. No records are available at this time and wife/daughter are  not clear on the details. He also reportedly had "passing out spells" several years ago; however, no syncope over the last few years. No chest pain or shortness of breath prior to this admission. He has not seen a  Cardiologist in years.  Reviewed telemetry, he had one isolated episode of non-sustained VT of about 30 beats last night around 6:40pm. I do not see any other episodes of non-sustained VT over the last 2 days. Otherwise, patient is normal sinus rhythm with rates in the 80s. From what I can gather, patient was asymptomatic at this time.  Patient does have a family history of cardiovascular disease with his father dying from a MI at a relatively young age (patient's daughter unsure of exact age). Patient does have a history of tobacco use and daughter states he may intermittently smoke a cigar.  Past Medical History:  Diagnosis Date   Diabetes mellitus without complication (HCC)    GERD (gastroesophageal reflux disease)    Hemorrhage in the brain ([place]) 08/2015   High cholesterol    Hyperlipidemia    Hypertension    Hypertensive emergency 08/03/2021    Past Surgical History:  Procedure Laterality Date   COLONOSCOPY WITH PROPOFOL N/A 09/08/2017   Procedure: COLONOSCOPY WITH PROPOFOL;  Surgeon: Lucilla Lame, MD;  Location: [place];  Service: Endoscopy;  Laterality: N/A;  Diabetic - oral meds   IR ANGIO INTRA EXTRACRAN SEL COM CAROTID INNOMINATE BILAT MOD SED  08/04/2021   IR ANGIO VERTEBRAL SEL SUBCLAVIAN INNOMINATE UNI L MOD SED  08/04/2021   IR ANGIO VERTEBRAL SEL VERTEBRAL UNI R MOD SED  08/04/2021   IR CT HEAD LTD  08/04/2021   IR INTRA CRAN STENT  08/04/2021   IR US GUIDE VASC ACCESS RIGHT  08/04/2021   partial intestine removed     approx date: 1980   POLYPECTOMY  09/08/2017   Procedure: POLYPECTOMY;  Surgeon: Lucilla Lame, MD;  Location: [place];  Service: Endoscopy;;   RADIOLOGY WITH ANESTHESIA N/A 08/04/2021   Procedure: Angioplasty/stenting of vertebrobasilar stenosis;  Surgeon: Luanne Bras, MD;  Location: [place];  Service: Radiology;  Laterality: N/A;   TRACHEOSTOMY TUBE PLACEMENT N/A 08/11/2021   Procedure: TRACHEOSTOMY;  Surgeon: Skotnicki, Meghan A, DO;   Location: [facility] OR;  Service: ENT;  Laterality: N/A;     Home Medications:  Prior to Admission medications   Medication Sig Start Date End Date Taking? Authorizing Provider  atorvastatin (LIPITOR) 20 MG tablet Take 20 mg by mouth daily. 08/02/21  Yes [provider]  cetirizine (ZYRTEC) 10 MG tablet Take 1 tablet (10 mg total) by mouth daily. 02/20/20  Yes Lavera Guise, MD  hydrochlorothiazide (MICROZIDE) 12.5 MG capsule Take 12.5 mg by mouth daily. 10/20/18  Yes [provider]  lisinopril (ZESTRIL) 40 MG tablet Take 40 mg by mouth daily.   Yes [provider]  metFORMIN (GLUCOPHAGE) 1000 MG tablet Take 1,000 mg by mouth 2 (two) times daily with a meal.   Yes [provider]  ACCU-CHEK FASTCLIX LANCETS MISC Use as directed twice daily dia E11.65 08/03/17   Lavera Guise, MD  atorvastatin (LIPITOR) 80 MG tablet Take 1 tablet (80 mg total) by mouth daily. Patient not taking: Reported on 08/05/2021 08/04/21   Gwynne Edinger, MD  Blood Glucose Monitoring Suppl (ACCU-CHEK AVIVA PLUS) w/Device KIT Use as directed 10/04/17   Ronnell Freshwater, NP  glucose blood (ACCU-CHEK AVIVA PLUS) test strip Use two times daily to check blood sugar  10/05/17   Ronnell Freshwater, NP  metoprolol tartrate (LOPRESSOR) 25 MG tablet Take 1 tablet (25 mg total) by mouth daily. Patient not taking: Reported on 08/03/2021 02/20/20   Lavera Guise, MD    Inpatient Medications: Scheduled Meds:  amLODipine  10 mg Per Tube Daily   aspirin  81 mg Per Tube Daily   atorvastatin  80 mg Per Tube Daily   carvedilol  25 mg Per Tube BID WC   cloNIDine  0.1 mg Per Tube TID   feeding supplement (PROSource TF20)  60 mL Per Tube Daily   fiber  1 packet Per Tube BID   folic acid  1 mg Per Tube Daily   free water  150 mL Per Tube Q4H   guaiFENesin  10 mL Per Tube Q4H   hydrochlorothiazide  12.5 mg Per Tube Daily   insulin aspart  10 Units Subcutaneous Q4H   insulin detemir  60 Units Subcutaneous Daily    liver oil-zinc oxide   Topical TID   multivitamin with minerals  1 tablet Per Tube Daily   mouth rinse  15 mL Mouth Rinse Q2H   potassium chloride  40 mEq Per Tube Q4H   thiamine  100 mg Per Tube Daily   ticagrelor  90 mg Per Tube BID   Continuous Infusions:  feeding supplement ([name]) 60 mL/hr at 08/31/21 0628   PRN Meds: acetaminophen **OR** acetaminophen (TYLENOL) oral liquid 160 mg/5 mL **OR** acetaminophen, bisacodyl, hydrALAZINE, labetalol, mouth rinse, oxyCODONE, senna-docusate  Allergies:    Allergies  Allergen Reactions   Lisinopril Anaphylaxis and Swelling    Angioedema (filled but not taking per family, 08/08/21).  TDD.   Anchovies [Fish Allergy] Swelling   Sulfa Antibiotics Swelling    Social History:   Social History   Socioeconomic History   Marital status: Married    Spouse name: Not on file   Number of children: Not on file   Years of education: Not on file   Highest education level: Not on file  Occupational History   Not on file  Tobacco Use   Smoking status: Former    Types: Cigarettes    Quit date: 1990    Years since quitting: 33.6   Smokeless tobacco: Never  Vaping Use   Vaping Use: Never used  Substance and Sexual Activity   Alcohol use: Yes    Alcohol/week: 7.0 standard drinks of alcohol    Types: 7 Cans of beer per week    Comment: social   Drug use: Never   Sexual activity: Not on file  Other Topics Concern   Not on file  Social History Narrative   Not on file   Social Determinants of Health   Financial Resource Strain: Not on file  Food Insecurity: Not on file  Transportation Needs: Not on file  Physical Activity: Not on file  Stress: Not on file  Social Connections: Not on file  Intimate Partner Violence: Not on file    Family History:   Family History  Problem Relation Age of Onset   Hypertension Father    Diabetes Sister      ROS:  Please see the history of present illness.  Review of Systems   Unable to perform ROS: Other (s/p stroke with aphasia)    Physical Exam/Data:   Vitals:   08/31/21 0428 08/31/21 0757 08/31/21 0838 08/31/21 1122  BP:  134/83 134/83 139/81  Pulse:  82 91 81  Resp:  (!) 36 (!)  24 (!) 30  Temp:  98.6 F (37 C)  98.3 F (36.8 C)  TempSrc:  Oral  Oral  SpO2:  97% 96% 98%  Weight: 80.5 kg     Height:        Intake/Output Summary (Last 24 hours) at 08/31/2021 1506 Last data filed at 08/31/2021 1134 Gross per 24 hour  Intake 1640 ml  Output 1600 ml  Net 40 ml      08/31/2021    4:28 AM 08/30/2021    3:50 AM 08/29/2021    4:30 AM  Last 3 Weights  Weight (lbs) 177 lb 7.5 oz 176 lb 12.9 oz 182 lb 8.7 oz  Weight (kg) 80.5 kg 80.2 kg 82.8 kg     Body mass index is 27.8 kg/m.  General: 66 y.o. ill appearing [place] male s/p tracheostomy. HEENT: Normocephalic and atraumatic.  Neck: Supple.  Heart: RRR. Distinct S1 and S2. No murmurs, gallops, or rubs. Radial  pulses 2+ and equal bilaterally. Lungs: Mild increased work of breathing with coughing. Upper respiratory sounds but no wheezes or crackles appreciated. Abdomen: Soft, non-distended, and non-tender to palpation. Bowel sounds present. Extremities: No lower extremity edema.    Skin: Warm and dry. Neuro: Alert. Able to tell me who his wife is by mouthing her name.  EKG:  The EKG was personally reviewed and demonstrates:  Initial EKG on 08/02/2021 showed normal sinus rhythm, rate 72 bpm, with PVC and mild T wave inversions in leads III and aVF.  Repeat EKG on 08/30/2021 showed normal sinus rhythm, rate 79 bpm, with nonspecific T wave changes.  Telemetry:  Telemetry was personally reviewed and demonstrates:  Normal sinus rhythm with rates in the 80s. One isolated 30 beat run of NSVT last night.  Relevant CV Studies:  Echocardiogram 08/03/2021: Impressions: 1. Left ventricular ejection fraction, by estimation, is 60 to 65%. The  left ventricle has normal function. The left ventricle has no  regional  wall motion abnormalities. There is moderate concentric left ventricular  hypertrophy. Left ventricular  diastolic parameters are consistent with Grade I diastolic dysfunction  (impaired relaxation).   2. Right ventricular systolic function is normal. The right ventricular  size is normal.   3. The mitral valve is normal in structure. Trivial mitral valve  regurgitation. No evidence of mitral stenosis.   4. The aortic valve is normal in structure. Aortic valve regurgitation is  mild. No aortic stenosis is present.   5. The inferior vena cava is normal in size with greater than 50%  respiratory variability, suggesting right atrial pressure of 3 mmHg.    Laboratory Data:  High Sensitivity Troponin:   Recent Labs  Lab 08/30/21 1935  TROPONINIHS 7     Chemistry Recent Labs  Lab 08/25/21 0241 08/27/21 0223 08/30/21 1935 08/30/21 1949 08/31/21 0953  NA 139 138  --  139  --   K 3.3* 3.5  --  3.3* 3.2*  CL 100 97*  --  99  --   CO2 29 30  --  31  --   GLUCOSE 177* 169*  --  152*  --   BUN 24* 20  --  21  --   CREATININE 0.71 0.75  --  0.76  --   CALCIUM 9.1 9.1  --  9.0  --   MG  --   --  1.8  --   --   GFRNONAA >60 >60  --  >60  --   ANIONGAP 10 11  --  9  --     No results for input(s): "PROT", "ALBUMIN", "AST", "ALT", "ALKPHOS", "BILITOT" in the last 168 hours. Lipids No results for input(s): "CHOL", "TRIG", "HDL", "LABVLDL", "LDLCALC", "CHOLHDL" in the last 168 hours.  Hematology Recent Labs  Lab 08/25/21 0241  WBC 8.2  RBC 4.50  HGB 12.9*  HCT 39.4  MCV 87.6  MCH 28.7  MCHC 32.7  RDW 13.1  PLT 452*   Thyroid No results for input(s): "TSH", "FREET4" in the last 168 hours.  BNPNo results for input(s): "BNP", "PROBNP" in the last 168 hours.  DDimer No results for input(s): "DDIMER" in the last 168 hours.   Radiology/Studies:  No results found.   Assessment and Plan:   Non-Sustained VT Patient admitted with acute stroke of medulla s/p stenting  of right vertebrobasilar junction on 08/04/2021. He has had a prolonged hospitalization complicated by acute hypoxic respiratory failure s/p intubation and then tracheostomy and dysphagia s/p PEG tube. Cardiology consulted today for evaluation of NSVT. Reviewed telemetry - patient had one 30 beat run of NSVT on 08/30/2021 around 6:40pm. I see no other episodes of this over the last 2 days. Echo earlier this admission showed normal LV function. Potassium low at 3.3 yesterday. Magnesium 1.8 yesterday. Potassium was repleted today. Continue Coreg 73m twice daily. Please keep potassium >4.0 and magnesium >2.0. May need daily potassium supplement while on HCTZ. No additional cardiac work-up necessary.  CAD History of CAD and reportedly had a couple of stents placed about 20 years ago. No records available.  - No chest pain. - Now on DAPT with Aspirin and Brilinta after stroke. - Continue beta-blocker and high-intensity statin.  Hypertension BP markedly elevated on arrival with systolic BP >>409 Initially required Cleviprex drip. BP now well controlled on oral medication which are being given through PEG tube. - Continue current medications: Amlodipine 141mdaily, Coreg 2562mwice daily, HCTZ 12.5mg36mily, Clonidine 0.1mg 41mee time daily.  Otherwise, per primary team. - Acute stroke of medulla - Uncontrolled type 2 diabetes - Hyperlipidemia - Dysphagia s/p PEG tube - Acute hypoxic respiratory failure s/p tracheostomy   Risk Assessment/Risk Scores:   N/A  For questions or updates, please contact [facility] consult [email or web address] for contact info under    Signed, CalliDarreld McleanC  08/31/2021 3:06 PM  Agree with note by CalliSande RivesC  Asked to see this chronically ill man for 25 beats of asymptomatic NSVT. Pt has nl LV fxn by 2 D and is already on Coreg 25 mg BID. K and Mg were slightly low. Would replete but otherwise would not pursue further w/u given current  clinical situation.   JonatLorretta Harp., FACP,[place],[place]verta [place] [address] [id number]/[phone] 6:52 PM

## 2021-09-01 LAB — CBC WITH DIFFERENTIAL/PLATELET
Abs Immature Granulocytes: 0.03 10*3/uL (ref 0.00–0.07)
Basophils Absolute: 0.1 10*3/uL (ref 0.0–0.1)
Basophils Relative: 1 %
Eosinophils Absolute: 0.2 10*3/uL (ref 0.0–0.5)
Eosinophils Relative: 3 %
HCT: 39.8 % (ref 39.0–52.0)
Hemoglobin: 13 g/dL (ref 13.0–17.0)
Immature Granulocytes: 0 %
Lymphocytes Relative: 12 %
Lymphs Abs: 1 10*3/uL (ref 0.7–4.0)
MCH: 28.3 pg (ref 26.0–34.0)
MCHC: 32.7 g/dL (ref 30.0–36.0)
MCV: 86.7 fL (ref 80.0–100.0)
Monocytes Absolute: 0.6 10*3/uL (ref 0.1–1.0)
Monocytes Relative: 7 %
Neutro Abs: 6.3 10*3/uL (ref 1.7–7.7)
Neutrophils Relative %: 77 %
Platelets: 333 10*3/uL (ref 150–400)
RBC: 4.59 MIL/uL (ref 4.22–5.81)
RDW: 12.7 % (ref 11.5–15.5)
WBC: 8.2 10*3/uL (ref 4.0–10.5)
nRBC: 0 % (ref 0.0–0.2)

## 2021-09-01 LAB — GLUCOSE, CAPILLARY
Glucose-Capillary: 175 mg/dL — ABNORMAL HIGH (ref 70–99)
Glucose-Capillary: 201 mg/dL — ABNORMAL HIGH (ref 70–99)
Glucose-Capillary: 178 mg/dL — ABNORMAL HIGH (ref 70–99)
Glucose-Capillary: 136 mg/dL — ABNORMAL HIGH (ref 70–99)
Glucose-Capillary: 120 mg/dL — ABNORMAL HIGH (ref 70–99)
Glucose-Capillary: 117 mg/dL — ABNORMAL HIGH (ref 70–99)

## 2021-09-01 LAB — COMPREHENSIVE METABOLIC PANEL
ALT: 65 U/L — ABNORMAL HIGH (ref 0–44)
AST: 39 U/L (ref 15–41)
Albumin: 2.6 g/dL — ABNORMAL LOW (ref 3.5–5.0)
Alkaline Phosphatase: 45 U/L (ref 38–126)
Anion gap: 10 (ref 5–15)
BUN: 18 mg/dL (ref 8–23)
CO2: 27 mmol/L (ref 22–32)
Calcium: 9 mg/dL (ref 8.9–10.3)
Chloride: 101 mmol/L (ref 98–111)
Creatinine, Ser: 0.67 mg/dL (ref 0.61–1.24)
GFR, Estimated: >60 mL/min (ref >60)
Glucose, Bld: 220 mg/dL — ABNORMAL HIGH (ref 70–99)
Potassium: 3.7 mmol/L (ref 3.5–5.1)
Sodium: 138 mmol/L (ref 135–145)
Total Bilirubin: 0.4 mg/dL (ref 0.3–1.2)
Total Protein: 6.9 g/dL (ref 6.5–8.1)

## 2021-09-01 LAB — MAGNESIUM: Magnesium: 1.7 mg/dL (ref 1.7–2.4)

## 2021-09-01 MED ORDER — SCOPOLAMINE 1 MG/3DAYS TD PT72
1.0000 | MEDICATED_PATCH | TRANSDERMAL | Status: DC
Start: 2021-09-01 — End: 2021-09-08
  Administered 2021-09-01 – 2021-09-07 (×3): 1.5 mg via TRANSDERMAL
  Filled 2021-09-01 (×3): qty 1

## 2021-09-01 NOTE — Progress Notes (Signed)
Speech Language Pathology Treatment: Cognitive-Linquistic  Patient Details Name: Jakaiden Fill MRN: [id number] DOB: 01/17/1955 Today's Date: 09/01/2021 Time: 1015-1130 SLP Time Calculation (min) (ACUTE ONLY): 75 min  Assessment / Plan / Recommendation Clinical Impression  Session today with Sammie Bench SLP from [employer] who is an Insurance risk surveyor and loaned Ceaser his current eye gaze device the Accent 1400. Aldona Bar demonstrated appropriate Program choice ("Vocabulary that I modified" - Unity 15 Phrase) to accommodate a 15 set field given challenges with Branton's gaze. Aldona Bar also provided teaching in modifying the board to pts specific needs and hiding buttons that are not needed (go to [employer] - hide all). Therapists repositioned Salil and the device to maximize visual accuracy. Even in the best conditions, the device is often not reading Chin's gaze despite his best effort. Samantha increased the target time and changed the setting to a dot that hovers over the direction of his gaze. Though Kodee was able to select three specific targets with max verbal and visual cues in 5 trials, it was very effortful and not yet functional.   It was determined that a tabletop mount has multiple challenges in a hospital setting (frequent movement of the table and reduced mobility given bedframe). Aldona Bar also feels that the device is very limited for Eber's needs "like a square peg in a round hole" given that the format is best for  the pediatric population. Aldona Bar will attempt to procure a Dynavox with floor mount and also suggests a second oral access method to select targets with gaze and will facilitate exploring these options.  For now,  the device is set up and Gloria has been given basic instruction on use, but it not expected to functionally communicate. Joseandres can access the device as desired for practice. Will f/u.    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 He  presented  08/02/21 with R foot pain and R-sided weakness. Transferred to [facility] 7/25. 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 S/p stent placement to the R vertebrobasilar junction 7/26, failed extubation. Trach and PEG placed 7/28. PMH: DM, GERD, TBI with prior ICH, HLD, HTN      SLP Plan  Continue with current plan of care      Recommendations for follow up therapy are one component of a multi-disciplinary discharge planning process, led by the attending physician.  Recommendations may be updated based on patient status, additional functional criteria and insurance authorization.    Recommendations                   Plan: Continue with current plan of care           Clista Rainford, [name]  09/01/2021, 2:59 PM

## 2021-09-01 NOTE — Progress Notes (Signed)
PROGRESS NOTE    Joshua Donovan  NWG:[id number] DOB: 1955-04-07 DOA: 08/03/2021 PCP: [facility] [place]  No chief complaint on file.   Brief Narrative:  Patient with history of ICH, type II DM, HTN, HLD presented to [facility] on 7/24 with right hand numbness and weakness, initially found to have Reiss Mowrey small brainstem stroke and ventral medulla.  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  He was intubated and transferred to [facility] for interventional radiology intervention.  Remained in the intensive care unit.  Now with tracheostomy and PEG tube placement.  Pending LTAC transfer.  7/24 presented to [facility], ventral medulla CVA 7/25 tx to [facility], treated with Cleviprex. 7/26 cerebral angiogram with stent placement to right vertebro-basilar junction, failed extubation, MRI brain> mild extension of stroke, now involving bilateral medial medullary, patent basilar artery and R VBJ stent  7/28 bedside tracheostomy by PCCM and PEG tube placement by general surgery 7/30 Hypertension persist despite adding oral agents, glucose remains elevated as well 7/31 Bleeding around trach site, bright red blood.  Underwent bronchoscopy, Trach Removed, patient orally reintubated, stoma packed. 8/2 ENT took patient to OR for trach redo 8/9 transferred to medical floor on tracheostomy now. 8/12 vomiting after bolus feeding with worsening hypoxia. 8/13 copious bleeding from the heparin injection site controlled with pressure dressing. 8/14 bleeding has resolved.  Trach secretions blood-tinged. 8/15 insurance denied LTAC appeal.  CSW working on SNF. 8/16>8/22: 25 beat run of NSVT on 8/21    Assessment & Plan:   Principal Problem:   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[place]) Active Problems:   Acute respiratory failure with hypoxia (HCC)   Hypertension   Uncontrolled type 2 diabetes mellitus with hyperglycemia (HCC)   Mixed hyperlipidemia   Alcohol  abuse   NSVT (nonsustained ventricular tachycardia) ([place])   Dysphagia   Tracheostomy dependent (HCC)   Assessment and Plan: * Acute stroke of medulla oblongata [facility]) Neurology consulted. Patient initially loaded with aspirin and Brilinta. Cerebral angiogram performed on 7/26 with placement of Uliana Brinker stent of right vertebro-basilar junction. Continue aspirin and Brilinta With dense quadriplegia, unable to move extremities Last note from neurology 8/4  Acute respiratory failure with hypoxia [facility]) Patient required ICU admission and mechanical ventilation after admission secondary to worsening mentation. Concern for possible aspiration pneumonitis. Patient unable to extubate. Tracheostomy placed on 7/31 and revised by ENT on 8/2 secondary to bleeding. Currently stable. -PCCM for trach management -Guaifenesin q4 hours to help with secretions -will add scopolamine patch for secretions  Alcohol abuse Initially managed on CIWA. Continue thiamine, multivitamin and folic acid  Mixed hyperlipidemia -Continue Lipitor  Uncontrolled type 2 diabetes mellitus with hyperglycemia (HCC) Most recent hemoglobin A1C of 8.3%. uncontrolled with hyper- and hypoglycemia. -Continue Levemir 60 units daily and Novolog 10 units every 4 hours  Hypertension Previously with hypertensive emergency which has now resolved. -Continue clonidine, amlodipine, hydrochlorothiazide and Coreg  NSVT (nonsustained ventricular tachycardia) (HCC) Patient with intermittent Vtach. Most recently with 25 beats of vtach on 8/21. QTc of 424 msec -Replete potassium and magnesium -Cardiology consult, maintain mag >2 and k >4, no additioonal cardiac w/u   Dysphagia Patient is s/p PEG tube and is on tube feeds via PEG. -Continue tube feeds and free water  Tracheostomy complication (HCC)-resolved as of 08/25/2021 In setting of DAPT. ENT consulted for revision. Bleeding resolved.      DVT prophylaxis: SCD Code Status: full Family  Communication: none at bedside Disposition:   Status is:  Inpatient Remains inpatient appropriate because: no safe d/c plan   Consultants:  PCCM Neurology ENT  Procedures:  echo  Antimicrobials:  Anti-infectives (From admission, onward)    Start     Dose/Rate Route Frequency Ordered Stop   08/11/21 1400  ceFEPIme (MAXIPIME) 2 g in sodium chloride 0.9 % 100 mL IVPB        2 g 200 mL/hr over 30 Minutes Intravenous Every 8 hours 08/11/21 0957 08/16/21 0633   08/09/21 1300  cefTRIAXone (ROCEPHIN) 1 g in sodium chloride 0.9 % 100 mL IVPB  Status:  Discontinued        1 g 200 mL/hr over 30 Minutes Intravenous Every 24 hours 08/09/21 1151 08/11/21 0957   08/08/21 1200  ceFEPIme (MAXIPIME) 2 g in sodium chloride 0.9 % 100 mL IVPB  Status:  Discontinued        2 g 200 mL/hr over 30 Minutes Intravenous Every 8 hours 08/08/21 1104 08/09/21 1151   08/06/21 1500  cefTRIAXone (ROCEPHIN) 2 g in sodium chloride 0.9 % 100 mL IVPB  Status:  Discontinued        2 g 200 mL/hr over 30 Minutes Intravenous Every 24 hours 08/06/21 1421 08/08/21 1104   08/04/21 1006  ceFAZolin (ANCEF) 2-4 GM/100ML-% IVPB       Note to Pharmacy: Margaretmary Dys E: cabinet override      08/04/21 1006 08/04/21 2214      Subjective: Denies pain  Objective: Vitals:   09/01/21 1153 09/01/21 1154 09/01/21 1548 09/01/21 1603  BP:      Pulse:  84  84  Resp:  (!) 24  (!) 22  Temp: 97.8 F (36.6 C)  99.1 F (37.3 C)   TempSrc: Oral  Oral   SpO2:  96%  95%  Weight:      Height:        Intake/Output Summary (Last 24 hours) at 09/01/2021 1712 Last data filed at 09/01/2021 1528 Gross per 24 hour  Intake --  Output 1600 ml  Net -1600 ml   Filed Weights   08/29/21 0430 08/30/21 0350 08/31/21 0428  Weight: 82.8 kg 80.2 kg 80.5 kg    Examination:  General exam: Appears calm and comfortable  Respiratory system: unlabored Cardiovascular system: RRR Gastrointestinal system: Abdomen is nondistended, soft and  nontender.  Central nervous system: Alert, communicates with eyes and mouthing words.  Unable to move arms/legs. Extremities: no LEE    Data Reviewed: I have personally reviewed following labs and imaging studies  CBC: Recent Labs  Lab 09/01/21 0934  WBC 8.2  NEUTROABS 6.3  HGB 13.0  HCT 39.8  MCV 86.7  PLT 458    Basic Metabolic Panel: Recent Labs  Lab 08/27/21 0223 08/30/21 1935 08/30/21 1949 08/31/21 0953 09/01/21 0934  NA 138  --  139  --  138  K 3.5  --  3.3* 3.2* 3.7  CL 97*  --  99  --  101  CO2 30  --  31  --  27  GLUCOSE 169*  --  152*  --  220*  BUN 20  --  21  --  18  CREATININE 0.75  --  0.76  --  0.67  CALCIUM 9.1  --  9.0  --  9.0  MG  --  1.8  --   --  1.7    GFR: Estimated Creatinine Clearance: 92.4 mL/min (by C-G formula based on SCr of 0.67 mg/dL).  Liver Function Tests: Recent Labs  Lab 09/01/21 0934  AST 39  ALT 65*  ALKPHOS 45  BILITOT 0.4  PROT 6.9  ALBUMIN 2.6*    CBG: Recent Labs  Lab 08/31/21 2331 09/01/21 0321 09/01/21 0809 09/01/21 1152 09/01/21 1538  GLUCAP 135* 175* 201* 178* 136*     No results found for this or any previous visit (from the past 240 hour(s)).       Radiology Studies: No results found.      Scheduled Meds:  amLODipine  10 mg Per Tube Daily   aspirin  81 mg Per Tube Daily   atorvastatin  80 mg Per Tube Daily   carvedilol  25 mg Per Tube BID WC   cloNIDine  0.1 mg Per Tube TID   feeding supplement (PROSource TF20)  60 mL Per Tube Daily   fiber supplement (BANATROL TF)  60 mL Per Tube BID   folic acid  1 mg Per Tube Daily   free water  150 mL Per Tube Q4H   guaiFENesin  10 mL Per Tube Q4H   hydrochlorothiazide  12.5 mg Per Tube Daily   insulin aspart  10 Units Subcutaneous Q4H   insulin detemir  60 Units Subcutaneous Daily   liver oil-zinc oxide   Topical TID   multivitamin with minerals  1 tablet Per Tube Daily   mouth rinse  15 mL Mouth Rinse Q2H   scopolamine  1 patch  Transdermal Q72H   thiamine  100 mg Per Tube Daily   ticagrelor  90 mg Per Tube BID   Continuous Infusions:  feeding supplement ([name]) 60 mL/hr at 08/31/21 0628     LOS: 29 days    Time spent: over 30 min    Fayrene Helper, MD [facility]   To contact the attending provider between 7A-7P or the covering provider during after hours 7P-7A, please log into the web site [email or web address] and access using universal  password for that web site. If you do not have the password, please call the hospital operator.  09/01/2021, 5:12 PM

## 2021-09-01 NOTE — Progress Notes (Signed)
Occupational Therapy Treatment Patient Details Name: Joshua Donovan MRN: [id number] DOB: 07-01-1955 Today's Date: 09/01/2021   History of present illness Pt is a 66 y.o. male who presented 08/02/21 with R foot pain and R-sided weakness. Transferred to [facility] 7/25. 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 S/p stent placement to the R vertebrobasilar junction 7/26, failed extubation. Trach and PEG placed 7/28. S/p bronchoscopic evaluation, oral reintubation and tracheostomy removal with stoma packing 7/31 due to trach site bleeding. S/p trach revision 8/2. PMH: DM, GERD, TBI with prior ICH, HLD, HTN   OT comments  Pt continuing to work on eye gaze, however this session noted to have increased nystagmus. PROM continues to be [facility]. No noted spasticity or activation this session. OT will continue to follow acutely and recommend SNF.    Recommendations for follow up therapy are one component of a multi-disciplinary discharge planning process, led by the attending physician.  Recommendations may be updated based on patient status, additional functional criteria and insurance authorization.    Follow Up Recommendations  OT at Long-term acute care hospital    Assistance Recommended at Discharge Frequent or constant Supervision/Assistance  Patient can return home with the following  Two people to help with walking and/or transfers;Two people to help with bathing/dressing/bathroom;Assistance with cooking/housework;Assistance with feeding;Direct supervision/assist for medications management;Direct supervision/assist for financial management;Assist  for transportation;Help with stairs or ramp for entrance   Equipment Recommendations  Other (comment)    Recommendations for Other Services      Precautions / Restrictions Precautions Precautions: Fall Precaution Comments: trach collar, peg, SBP < 180, prevalon boots, copious trach secretions, watch HR Restrictions Weight Bearing Restrictions: No       Mobility Bed Mobility Overal bed mobility: Needs Assistance Bed Mobility: Rolling Rolling: Total assist, +2 for physical assistance, +2 for safety/equipment         General bed mobility comments: Rolling for repositioning in bed.    Transfers                   General transfer comment: Pt placed in chair position this session     Balance                                           ADL either performed or assessed with clinical judgement   ADL Overall ADL's : Needs assistance/impaired                                       General ADL Comments: total A for all aspects of his care    Extremity/Trunk Assessment              Vision   Additional Comments: Increased nystagmus noted this session compared with previous sesssion   Perception     Praxis      Cognition Arousal/Alertness: Awake/alert Behavior During Therapy: WFL for tasks assessed/performed Overall Cognitive Status: Difficult to assess  Exercises Exercises: General Upper Extremity, Other exercises General Exercises - Upper Extremity Shoulder Flexion: PROM, Both, 10 reps, Supine Shoulder ABduction: PROM, Both, 10 reps, Supine Shoulder Horizontal ABduction: PROM, Both, 10 reps, Supine Shoulder Horizontal ADduction: PROM, Both, 10 reps, Supine Elbow Flexion: PROM, Both, 10 reps, Supine Elbow Extension: PROM, Both, 10 reps, Supine Wrist Flexion: PROM, Both, 10 reps, Supine Wrist Extension: PROM, Both, 10 reps, Supine Digit Composite Flexion: PROM,  Both, 10 reps, Supine Composite Extension: PROM, Both, 10 reps, Supine Other Exercises Other Exercises: Cervical ROM: rotation L and R, tilting L and R, flexion and extension Other Exercises: Working on directing eye gaze    Shoulder Instructions       General Comments VSS on trach collar 5LPM 28%    Pertinent Vitals/ Pain       Pain Assessment Pain Assessment: No/denies pain Pain Intervention(s): Monitored during session  Home Living                                          Prior Functioning/Environment              Frequency  Min 2X/week        Progress Toward Goals  OT Goals(current goals can now be found in the care plan section)  Progress towards OT goals: Progressing toward goals  Acute Rehab OT Goals Patient Stated Goal: To go home OT Goal Formulation: Patient unable to participate in goal setting Time For Goal Achievement: 09/15/21 Potential to Achieve Goals: Fair ADL Goals Pt/caregiver will Perform Home Exercise Program: Both right and left upper extremity;With written HEP provided Additional ADL Goal #1: Pt's family will be educated about bed positioning to avoid pressure sores and increase care of skin Additional ADL Goal #2: Pt will use soft touch call bell to call nurses by moving head with min assist. Additional ADL Goal #3: Therapist to explore other options for call bell use if pt unable to move head to use soft touch system.  Plan Discharge plan remains appropriate    Co-evaluation                 AM-PAC OT "6 Clicks" Daily Activity     Outcome Measure   Help from another person eating meals?: Total Help from another person taking care of personal grooming?: Total Help from another person toileting, which includes using toliet, bedpan, or urinal?: Total Help from another person bathing (including washing, rinsing, drying)?: Total Help from another person to put on and taking off regular upper body clothing?:  Total Help from another person to put on and taking off regular lower body clothing?: Total 6 Click Score: 6    End of Session Equipment Utilized During Treatment: Oxygen  OT Visit Diagnosis: Other symptoms and signs involving the nervous system (R29.898)   Activity Tolerance Patient tolerated treatment well   Patient Left in bed;with bed alarm set;with call bell/phone within reach   Nurse Communication Mobility status        Time: 4132-4401 OT Time Calculation (min): 24 min  Charges: OT General Charges $OT Visit: 1 Visit OT Treatments $Therapeutic Activity: 8-22 mins $Therapeutic Exercise: 8-22 mins  Paulita Fujita, OTR/L  Alonso Gapinski Elane Erma Raiche 09/01/2021, 2:30 PM

## 2021-09-02 LAB — COMPREHENSIVE METABOLIC PANEL
ALT: 60 U/L — ABNORMAL HIGH (ref 0–44)
AST: 33 U/L (ref 15–41)
Albumin: 2.4 g/dL — ABNORMAL LOW (ref 3.5–5.0)
Alkaline Phosphatase: 39 U/L (ref 38–126)
Anion gap: 9 (ref 5–15)
BUN: 17 mg/dL (ref 8–23)
CO2: 28 mmol/L (ref 22–32)
Calcium: 8.8 mg/dL — ABNORMAL LOW (ref 8.9–10.3)
Chloride: 100 mmol/L (ref 98–111)
Creatinine, Ser: 0.71 mg/dL (ref 0.61–1.24)
GFR, Estimated: >60 mL/min (ref >60)
Glucose, Bld: 164 mg/dL — ABNORMAL HIGH (ref 70–99)
Potassium: 3.5 mmol/L (ref 3.5–5.1)
Sodium: 137 mmol/L (ref 135–145)
Total Bilirubin: 0.3 mg/dL (ref 0.3–1.2)
Total Protein: 6.3 g/dL — ABNORMAL LOW (ref 6.5–8.1)

## 2021-09-02 LAB — CBC WITH DIFFERENTIAL/PLATELET
Abs Immature Granulocytes: 0.03 10*3/uL (ref 0.00–0.07)
Basophils Absolute: 0.1 10*3/uL (ref 0.0–0.1)
Basophils Relative: 1 %
Eosinophils Absolute: 0.3 10*3/uL (ref 0.0–0.5)
Eosinophils Relative: 3 %
HCT: 37 % — ABNORMAL LOW (ref 39.0–52.0)
Hemoglobin: 12.1 g/dL — ABNORMAL LOW (ref 13.0–17.0)
Immature Granulocytes: 0 %
Lymphocytes Relative: 16 %
Lymphs Abs: 1.5 10*3/uL (ref 0.7–4.0)
MCH: 28.4 pg (ref 26.0–34.0)
MCHC: 32.7 g/dL (ref 30.0–36.0)
MCV: 86.9 fL (ref 80.0–100.0)
Monocytes Absolute: 0.9 10*3/uL (ref 0.1–1.0)
Monocytes Relative: 9 %
Neutro Abs: 6.4 10*3/uL (ref 1.7–7.7)
Neutrophils Relative %: 71 %
Platelets: 325 10*3/uL (ref 150–400)
RBC: 4.26 MIL/uL (ref 4.22–5.81)
RDW: 12.8 % (ref 11.5–15.5)
WBC: 9.1 10*3/uL (ref 4.0–10.5)
nRBC: 0 % (ref 0.0–0.2)

## 2021-09-02 LAB — GLUCOSE, CAPILLARY
Glucose-Capillary: 173 mg/dL — ABNORMAL HIGH (ref 70–99)
Glucose-Capillary: 130 mg/dL — ABNORMAL HIGH (ref 70–99)
Glucose-Capillary: 145 mg/dL — ABNORMAL HIGH (ref 70–99)
Glucose-Capillary: 120 mg/dL — ABNORMAL HIGH (ref 70–99)
Glucose-Capillary: 103 mg/dL — ABNORMAL HIGH (ref 70–99)
Glucose-Capillary: 166 mg/dL — ABNORMAL HIGH (ref 70–99)

## 2021-09-02 LAB — PHOSPHORUS: Phosphorus: 4.7 mg/dL — ABNORMAL HIGH (ref 2.5–4.6)

## 2021-09-02 LAB — MAGNESIUM: Magnesium: 1.7 mg/dL (ref 1.7–2.4)

## 2021-09-02 MED ORDER — POTASSIUM CHLORIDE CRYS ER 20 MEQ PO TBCR
60.0000 meq | EXTENDED_RELEASE_TABLET | Freq: Once | ORAL | Status: DC
Start: 2021-09-02 — End: 2021-09-02

## 2021-09-02 MED ORDER — MAGNESIUM SULFATE 2 GM/50ML IV SOLN
2.0000 g | Freq: Once | INTRAVENOUS | Status: AC
  Administered 2021-09-02: 2 g via INTRAVENOUS
  Filled 2021-09-02: qty 50

## 2021-09-02 MED ORDER — POTASSIUM CHLORIDE 20 MEQ PO PACK
60.0000 meq | PACK | Freq: Once | ORAL | Status: AC
Start: 2021-09-02 — End: 2021-09-02
  Administered 2021-09-02: 60 meq
  Filled 2021-09-02: qty 3

## 2021-09-02 NOTE — Progress Notes (Signed)
PROGRESS NOTE    Joshua Donovan  VVO:[id number] DOB: 02-Jul-1955 DOA: 08/03/2021 PCP: [facility] [place]  No chief complaint on file.   Brief Narrative:  Patient with history of ICH, type II DM, HTN, HLD presented to [facility] on 7/24 with right hand numbness and weakness, initially found to have Collier Bohnet small brainstem stroke and ventral medulla.  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  He was intubated and transferred to [facility] for interventional radiology intervention.  Remained in the intensive care unit.  Now with tracheostomy and PEG tube placement.  Pending LTAC transfer.  7/24 presented to [facility], ventral medulla CVA 7/25 tx to [facility], treated with Cleviprex. 7/26 cerebral angiogram with stent placement to right vertebro-basilar junction, failed extubation, MRI brain> mild extension of stroke, now involving bilateral medial medullary, patent basilar artery and R VBJ stent  7/28 bedside tracheostomy by PCCM and PEG tube placement by general surgery 7/30 Hypertension persist despite adding oral agents, glucose remains elevated as well 7/31 Bleeding around trach site, bright red blood.  Underwent bronchoscopy, Trach Removed, patient orally reintubated, stoma packed. 8/2 ENT took patient to OR for trach redo 8/9 transferred to medical floor on tracheostomy now. 8/12 vomiting after bolus feeding with worsening hypoxia. 8/13 copious bleeding from the heparin injection site controlled with pressure dressing. 8/14 bleeding has resolved.  Trach secretions blood-tinged. 8/15 insurance denied LTAC appeal.  CSW working on SNF. 8/16>8/22: 25 beat run of NSVT on 8/21    Assessment & Plan:   Principal Problem:   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[place]) Active Problems:   Acute respiratory failure with hypoxia (HCC)   Hypertension   Uncontrolled type 2 diabetes mellitus with hyperglycemia (HCC)   Mixed hyperlipidemia   Alcohol  abuse   NSVT (nonsustained ventricular tachycardia) ([place])   Dysphagia   Tracheostomy dependent (HCC)   Assessment and Plan: * Acute stroke of medulla oblongata [facility]) Neurology consulted. Patient initially loaded with aspirin and Brilinta. Cerebral angiogram performed on 7/26 with placement of Lajuanna Pompa stent of right vertebro-basilar junction. Continue aspirin and Brilinta With dense quadriplegia, unable to move extremities Last note from neurology 8/4  Acute respiratory failure with hypoxia [facility]) Patient required ICU admission and mechanical ventilation after admission secondary to worsening mentation. Concern for possible aspiration pneumonitis. Patient unable to extubate. Tracheostomy placed on 7/31 and revised by ENT on 8/2 secondary to bleeding. Currently stable. -PCCM for trach management -Guaifenesin q4 hours to help with secretions -will add scopolamine patch for secretions  Alcohol abuse Initially managed on CIWA. Continue thiamine, multivitamin and folic acid  Mixed hyperlipidemia -Continue Lipitor  Uncontrolled type 2 diabetes mellitus with hyperglycemia (HCC) Most recent hemoglobin A1C of 8.3%. uncontrolled with hyper- and hypoglycemia. -Continue Levemir 60 units daily and Novolog 10 units every 4 hours  Hypertension Previously with hypertensive emergency which has now resolved. -Continue clonidine, amlodipine, hydrochlorothiazide and Coreg  NSVT (nonsustained ventricular tachycardia) (HCC) Patient with intermittent Vtach. Most recently with 25 beats of vtach on 8/21. QTc of 424 msec -Replete potassium and magnesium -Cardiology consult, maintain mag >2 and k >4, no additioonal cardiac w/u   Dysphagia Patient is s/p PEG tube and is on tube feeds via PEG. -Continue tube feeds and free water  Tracheostomy complication (HCC)-resolved as of 08/25/2021 In setting of DAPT. ENT consulted for revision. Bleeding resolved.      DVT prophylaxis: SCD Code Status: full Family  Communication: none at bedside Disposition:   Status is:  Inpatient Remains inpatient appropriate because: no safe d/c plan   Consultants:  PCCM Neurology ENT  Procedures:  echo  Antimicrobials:  Anti-infectives (From admission, onward)    Start     Dose/Rate Route Frequency Ordered Stop   08/11/21 1400  ceFEPIme (MAXIPIME) 2 g in sodium chloride 0.9 % 100 mL IVPB        2 g 200 mL/hr over 30 Minutes Intravenous Every 8 hours 08/11/21 0957 08/16/21 0633   08/09/21 1300  cefTRIAXone (ROCEPHIN) 1 g in sodium chloride 0.9 % 100 mL IVPB  Status:  Discontinued        1 g 200 mL/hr over 30 Minutes Intravenous Every 24 hours 08/09/21 1151 08/11/21 0957   08/08/21 1200  ceFEPIme (MAXIPIME) 2 g in sodium chloride 0.9 % 100 mL IVPB  Status:  Discontinued        2 g 200 mL/hr over 30 Minutes Intravenous Every 8 hours 08/08/21 1104 08/09/21 1151   08/06/21 1500  cefTRIAXone (ROCEPHIN) 2 g in sodium chloride 0.9 % 100 mL IVPB  Status:  Discontinued        2 g 200 mL/hr over 30 Minutes Intravenous Every 24 hours 08/06/21 1421 08/08/21 1104   08/04/21 1006  ceFAZolin (ANCEF) 2-4 GM/100ML-% IVPB       Note to Pharmacy: Margaretmary Dys E: cabinet override      08/04/21 1006 08/04/21 2214      Subjective: Denies pain  Objective: Vitals:   09/02/21 0740 09/02/21 0910 09/02/21 1105 09/02/21 1140  BP: (!) 146/83   (!) 143/90  Pulse: 81 91 83 80  Resp: (!) 29 (!) 31 (!) 30 (!) 28  Temp: 98.8 F (37.1 C)   98.4 F (36.9 C)  TempSrc: Oral   Oral  SpO2: 95% 98% 98% 96%  Weight:      Height:        Intake/Output Summary (Last 24 hours) at 09/02/2021 1401 Last data filed at 09/02/2021 0610 Gross per 24 hour  Intake --  Output 1300 ml  Net -1300 ml   Filed Weights   08/29/21 0430 08/30/21 0350 08/31/21 0428  Weight: 82.8 kg 80.2 kg 80.5 kg    Examination:  General: No acute distress. Cardiovascular: RRR Lungs: unlabored Abdomen: Soft, nontender, nondistended  Neurological:  mouths words yes/no, difficult to understand, dense quadriplegia  Extremities: No clubbing or cyanosis. No edema.   Data Reviewed: I have personally reviewed following labs and imaging studies  CBC: Recent Labs  Lab 09/01/21 0934 09/02/21 0224  WBC 8.2 9.1  NEUTROABS 6.3 6.4  HGB 13.0 12.1*  HCT 39.8 37.0*  MCV 86.7 86.9  PLT 333 161    Basic Metabolic Panel: Recent Labs  Lab 08/27/21 0223 08/30/21 1935 08/30/21 1949 08/31/21 0953 09/01/21 0934 09/02/21 0224  NA 138  --  139  --  138 137  K 3.5  --  3.3* 3.2* 3.7 3.5  CL 97*  --  99  --  101 100  CO2 30  --  31  --  27 28  GLUCOSE 169*  --  152*  --  220* 164*  BUN 20  --  21  --  18 17  CREATININE 0.75  --  0.76  --  0.67 0.71  CALCIUM 9.1  --  9.0  --  9.0 8.8*  MG  --  1.8  --   --  1.7 1.7  PHOS  --   --   --   --   --  4.7*    GFR: Estimated Creatinine Clearance: 92.4 mL/min (by C-G formula based on SCr of 0.71 mg/dL).  Liver Function Tests: Recent Labs  Lab 09/01/21 0934 09/02/21 0224  AST 39 33  ALT 65* 60*  ALKPHOS 45 39  BILITOT 0.4 0.3  PROT 6.9 6.3*  ALBUMIN 2.6* 2.4*    CBG: Recent Labs  Lab 09/01/21 1952 09/01/21 2308 09/02/21 0316 09/02/21 0816 09/02/21 1143  GLUCAP 120* 117* 173* 130* 145*     No results found for this or any previous visit (from the past 240 hour(s)).       Radiology Studies: No results found.      Scheduled Meds:  amLODipine  10 mg Per Tube Daily   aspirin  81 mg Per Tube Daily   atorvastatin  80 mg Per Tube Daily   carvedilol  25 mg Per Tube BID WC   cloNIDine  0.1 mg Per Tube TID   feeding supplement (PROSource TF20)  60 mL Per Tube Daily   fiber supplement (BANATROL TF)  60 mL Per Tube BID   folic acid  1 mg Per Tube Daily   free water  150 mL Per Tube Q4H   guaiFENesin  10 mL Per Tube Q4H   hydrochlorothiazide  12.5 mg Per Tube Daily   insulin aspart  10 Units Subcutaneous Q4H   insulin detemir  60 Units Subcutaneous Daily   liver  oil-zinc oxide   Topical TID   multivitamin with minerals  1 tablet Per Tube Daily   mouth rinse  15 mL Mouth Rinse Q2H   scopolamine  1 patch Transdermal Q72H   thiamine  100 mg Per Tube Daily   ticagrelor  90 mg Per Tube BID   Continuous Infusions:  feeding supplement ([name]) 1,000 mL (09/02/21 0529)     LOS: 30 days    Time spent: over 30 min    Fayrene Helper, MD [facility]   To contact the attending provider between 7A-7P or the covering provider during after hours 7P-7A, please log into the web site [email or web address] and access using universal [place] password for that web site. If you do not have the password, please call the hospital operator.  09/02/2021, 2:01 PM

## 2021-09-03 LAB — CBC WITH DIFFERENTIAL/PLATELET
Abs Immature Granulocytes: 0.02 10*3/uL (ref 0.00–0.07)
Basophils Absolute: 0.1 10*3/uL (ref 0.0–0.1)
Basophils Relative: 1 %
Eosinophils Absolute: 0.3 10*3/uL (ref 0.0–0.5)
Eosinophils Relative: 3 %
HCT: 38.3 % — ABNORMAL LOW (ref 39.0–52.0)
Hemoglobin: 12.8 g/dL — ABNORMAL LOW (ref 13.0–17.0)
Immature Granulocytes: 0 %
Lymphocytes Relative: 18 %
Lymphs Abs: 1.5 10*3/uL (ref 0.7–4.0)
MCH: 28.8 pg (ref 26.0–34.0)
MCHC: 33.4 g/dL (ref 30.0–36.0)
MCV: 86.1 fL (ref 80.0–100.0)
Monocytes Absolute: 0.8 10*3/uL (ref 0.1–1.0)
Monocytes Relative: 9 %
Neutro Abs: 5.9 10*3/uL (ref 1.7–7.7)
Neutrophils Relative %: 69 %
Platelets: 324 10*3/uL (ref 150–400)
RBC: 4.45 MIL/uL (ref 4.22–5.81)
RDW: 12.8 % (ref 11.5–15.5)
WBC: 8.5 10*3/uL (ref 4.0–10.5)
nRBC: 0 % (ref 0.0–0.2)

## 2021-09-03 LAB — GLUCOSE, CAPILLARY
Glucose-Capillary: 76 mg/dL (ref 70–99)
Glucose-Capillary: 183 mg/dL — ABNORMAL HIGH (ref 70–99)
Glucose-Capillary: 173 mg/dL — ABNORMAL HIGH (ref 70–99)
Glucose-Capillary: 140 mg/dL — ABNORMAL HIGH (ref 70–99)
Glucose-Capillary: 126 mg/dL — ABNORMAL HIGH (ref 70–99)
Glucose-Capillary: 106 mg/dL — ABNORMAL HIGH (ref 70–99)

## 2021-09-03 LAB — MAGNESIUM: Magnesium: 2 mg/dL (ref 1.7–2.4)

## 2021-09-03 LAB — COMPREHENSIVE METABOLIC PANEL
ALT: 77 U/L — ABNORMAL HIGH (ref 0–44)
AST: 48 U/L — ABNORMAL HIGH (ref 15–41)
Albumin: 2.6 g/dL — ABNORMAL LOW (ref 3.5–5.0)
Alkaline Phosphatase: 39 U/L (ref 38–126)
Anion gap: 10 (ref 5–15)
BUN: 20 mg/dL (ref 8–23)
CO2: 29 mmol/L (ref 22–32)
Calcium: 9.4 mg/dL (ref 8.9–10.3)
Chloride: 102 mmol/L (ref 98–111)
Creatinine, Ser: 0.71 mg/dL (ref 0.61–1.24)
GFR, Estimated: >60 mL/min (ref >60)
Glucose, Bld: 92 mg/dL (ref 70–99)
Potassium: 3.7 mmol/L (ref 3.5–5.1)
Sodium: 141 mmol/L (ref 135–145)
Total Bilirubin: 0.4 mg/dL (ref 0.3–1.2)
Total Protein: 6.7 g/dL (ref 6.5–8.1)

## 2021-09-03 LAB — PHOSPHORUS: Phosphorus: 4.8 mg/dL — ABNORMAL HIGH (ref 2.5–4.6)

## 2021-09-03 MED ORDER — INSULIN DETEMIR 100 UNIT/ML ~~LOC~~ SOLN
55.0000 [IU] | Freq: Every day | SUBCUTANEOUS | Status: DC
Start: 2021-09-04 — End: 2021-09-15
  Administered 2021-09-04 – 2021-09-15 (×12): 55 [IU] via SUBCUTANEOUS
  Filled 2021-09-03 (×12): qty 0.55

## 2021-09-03 MED ORDER — POTASSIUM CHLORIDE 20 MEQ PO PACK
40.0000 meq | PACK | Freq: Every day | ORAL | Status: DC
  Administered 2021-09-03 – 2021-09-13 (×11): 40 meq
  Filled 2021-09-03 (×11): qty 2

## 2021-09-03 NOTE — Progress Notes (Signed)
PROGRESS NOTE    Joshua Donovan  CXK:[id number] DOB: July 19, 1955 DOA: 08/03/2021 PCP: [facility] [place]  No chief complaint on file.   Brief Narrative:  Patient with history of ICH, type II DM, HTN, HLD presented to [facility] on 7/24 with right hand numbness and weakness, initially found to have Riot Barrick small brainstem stroke and ventral medulla.  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  He was intubated and transferred to [facility] for interventional radiology intervention.  Remained in the intensive care unit.  Now with tracheostomy and PEG tube placement.  Pending LTAC transfer.  7/24 presented to [facility], ventral medulla CVA 7/25 tx to [facility], treated with Cleviprex. 7/26 cerebral angiogram with stent placement to right vertebro-basilar junction, failed extubation, MRI brain> mild extension of stroke, now involving bilateral medial medullary, patent basilar artery and R VBJ stent  7/28 bedside tracheostomy by PCCM and PEG tube placement by general surgery 7/30 Hypertension persist despite adding oral agents, glucose remains elevated as well 7/31 Bleeding around trach site, bright red blood.  Underwent bronchoscopy, Trach Removed, patient orally reintubated, stoma packed. 8/2 ENT took patient to OR for trach redo 8/9 transferred to medical floor on tracheostomy now. 8/12 vomiting after bolus feeding with worsening hypoxia. 8/13 copious bleeding from the heparin injection site controlled with pressure dressing. 8/14 bleeding has resolved.  Trach secretions blood-tinged. 8/15 insurance denied LTAC appeal.  CSW working on SNF. 8/16>8/22: 25 beat run of NSVT on 8/21    Assessment & Plan:   Principal Problem:   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[place]) Active Problems:   Acute respiratory failure with hypoxia (HCC)   Hypertension   Uncontrolled type 2 diabetes mellitus with hyperglycemia (HCC)   Mixed hyperlipidemia   Alcohol  abuse   NSVT (nonsustained ventricular tachycardia) ([place])   Dysphagia   Tracheostomy dependent (HCC)   Assessment and Plan: * Acute stroke of medulla oblongata [facility]) Neurology consulted. Patient initially loaded with aspirin and Brilinta. Cerebral angiogram performed on 7/26 with placement of Sian Joles stent of right vertebro-basilar junction. Continue aspirin and Brilinta With dense quadriplegia, unable to move extremities Last note from neurology 8/4  Acute respiratory failure with hypoxia [facility]) Patient required ICU admission and mechanical ventilation after admission secondary to worsening mentation. Concern for possible aspiration pneumonitis. Patient unable to extubate. Tracheostomy placed on 7/31 and revised by ENT on 8/2 secondary to bleeding. Currently stable. -PCCM for trach management -Guaifenesin q4 hours to help with secretions -will add scopolamine patch for secretions  Alcohol abuse Initially managed on CIWA. Continue thiamine, multivitamin and folic acid  Mixed hyperlipidemia -Continue Lipitor  Uncontrolled type 2 diabetes mellitus with hyperglycemia (HCC) Most recent hemoglobin A1C of 8.3%. uncontrolled with hyper- and hypoglycemia. -reduce dose of Levemir to 55 units daily and Novolog 10 units every 4 hours  Hypertension Previously with hypertensive emergency which has now resolved. -Continue clonidine, amlodipine, hydrochlorothiazide and Coreg  NSVT (nonsustained ventricular tachycardia) (HCC) Patient with intermittent Vtach. Most recently with 25 beats of vtach on 8/21. QTc of 424 msec -Replete potassium and magnesium -Cardiology consult, maintain mag >2 and k >4, no additioonal cardiac w/u   Dysphagia Patient is s/p PEG tube and is on tube feeds via PEG. -Continue tube feeds and free water  Tracheostomy complication (HCC)-resolved as of 08/25/2021 In setting of DAPT. ENT consulted for revision. Bleeding resolved.      DVT prophylaxis: SCD Code Status:  full Family Communication: none at bedside - daughter  over phone Disposition:   Status is: Inpatient Remains inpatient appropriate because: no safe d/c plan   Consultants:  PCCM Neurology ENT  Procedures:  echo  Antimicrobials:  Anti-infectives (From admission, onward)    Start     Dose/Rate Route Frequency Ordered Stop   08/11/21 1400  ceFEPIme (MAXIPIME) 2 g in sodium chloride 0.9 % 100 mL IVPB        2 g 200 mL/hr over 30 Minutes Intravenous Every 8 hours 08/11/21 0957 08/16/21 0633   08/09/21 1300  cefTRIAXone (ROCEPHIN) 1 g in sodium chloride 0.9 % 100 mL IVPB  Status:  Discontinued        1 g 200 mL/hr over 30 Minutes Intravenous Every 24 hours 08/09/21 1151 08/11/21 0957   08/08/21 1200  ceFEPIme (MAXIPIME) 2 g in sodium chloride 0.9 % 100 mL IVPB  Status:  Discontinued        2 g 200 mL/hr over 30 Minutes Intravenous Every 8 hours 08/08/21 1104 08/09/21 1151   08/06/21 1500  cefTRIAXone (ROCEPHIN) 2 g in sodium chloride 0.9 % 100 mL IVPB  Status:  Discontinued        2 g 200 mL/hr over 30 Minutes Intravenous Every 24 hours 08/06/21 1421 08/08/21 1104   08/04/21 1006  ceFAZolin (ANCEF) 2-4 GM/100ML-% IVPB       Note to Pharmacy: Margaretmary Dys E: cabinet override      08/04/21 1006 08/04/21 2214      Subjective: Denies pain or needs  Objective: Vitals:   09/03/21 1130 09/03/21 1213 09/03/21 1557 09/03/21 1618  BP:  127/82  139/86  Pulse:  81 85 84  Resp:  (!) 29 (!) 27 (!) 35  Temp:  98.6 F (37 C)  98.3 F (36.8 C)  TempSrc:  Oral  Oral  SpO2: 94% 98% 98% 97%  Weight:      Height:        Intake/Output Summary (Last 24 hours) at 09/03/2021 1746 Last data filed at 09/03/2021 1000 Gross per 24 hour  Intake --  Output 1300 ml  Net -1300 ml   Filed Weights   08/29/21 0430 08/30/21 0350 08/31/21 0428  Weight: 82.8 kg 80.2 kg 80.5 kg    Examination:  General: No acute distress. Cardiovascular: RRR Lungs: trach, unlabored Abdomen: Soft, nontender,  nondistended - peg Neurological: mouths yes or no, difficult to understand, dense quadriplegia Extremities: No clubbing or cyanosis. No edema   Data Reviewed: I have personally reviewed following labs and imaging studies  CBC: Recent Labs  Lab 09/01/21 0934 09/02/21 0224 09/03/21 0233  WBC 8.2 9.1 8.5  NEUTROABS 6.3 6.4 5.9  HGB 13.0 12.1* 12.8*  HCT 39.8 37.0* 38.3*  MCV 86.7 86.9 86.1  PLT 333 325 778    Basic Metabolic Panel: Recent Labs  Lab 08/30/21 1935 08/30/21 1949 08/31/21 0953 09/01/21 0934 09/02/21 0224 09/03/21 0233  NA  --  139  --  138 137 141  K  --  3.3* 3.2* 3.7 3.5 3.7  CL  --  99  --  101 100 102  CO2  --  31  --  '27 28 29  '$ GLUCOSE  --  152*  --  220* 164* 92  BUN  --  21  --  '18 17 20  '$ CREATININE  --  0.76  --  0.67 0.71 0.71  CALCIUM  --  9.0  --  9.0 8.8* 9.4  MG 1.8  --   --  1.7 1.7  2.0  PHOS  --   --   --   --  4.7* 4.8*    GFR: Estimated Creatinine Clearance: 92.4 mL/min (by C-G formula based on SCr of 0.71 mg/dL).  Liver Function Tests: Recent Labs  Lab 09/01/21 0934 09/02/21 0224 09/03/21 0233  AST 39 33 48*  ALT 65* 60* 77*  ALKPHOS 45 39 39  BILITOT 0.4 0.3 0.4  PROT 6.9 6.3* 6.7  ALBUMIN 2.6* 2.4* 2.6*    CBG: Recent Labs  Lab 09/02/21 2313 09/03/21 0325 09/03/21 0749 09/03/21 1211 09/03/21 1617  GLUCAP 166* 76 183* 173* 140*     No results found for this or any previous visit (from the past 240 hour(s)).       Radiology Studies: No results found.      Scheduled Meds:  amLODipine  10 mg Per Tube Daily   aspirin  81 mg Per Tube Daily   atorvastatin  80 mg Per Tube Daily   carvedilol  25 mg Per Tube BID WC   cloNIDine  0.1 mg Per Tube TID   feeding supplement (PROSource TF20)  60 mL Per Tube Daily   fiber supplement (BANATROL TF)  60 mL Per Tube BID   folic acid  1 mg Per Tube Daily   free water  150 mL Per Tube Q4H   guaiFENesin  10 mL Per Tube Q4H   hydrochlorothiazide  12.5 mg Per Tube Daily    insulin aspart  10 Units Subcutaneous Q4H   [START ON 09/04/2021] insulin detemir  55 Units Subcutaneous Daily   liver oil-zinc oxide   Topical TID   multivitamin with minerals  1 tablet Per Tube Daily   mouth rinse  15 mL Mouth Rinse Q2H   potassium chloride  40 mEq Oral Daily   scopolamine  1 patch Transdermal Q72H   thiamine  100 mg Per Tube Daily   ticagrelor  90 mg Per Tube BID   Continuous Infusions:  feeding supplement ([name]) 1,000 mL (09/02/21 0529)     LOS: 31 days    Time spent: over 30 min    Fayrene Helper, MD [facility]   To contact the attending provider between 7A-7P or the covering provider during after hours 7P-7A, please log into the web site [email or web address] and access using universal [place] password for that web site. If you do not have the password, please call the hospital operator.  09/03/2021, 5:46 PM

## 2021-09-03 NOTE — Progress Notes (Addendum)
Physical Therapy Treatment Patient Details Name: Joshua Donovan MRN: [id number] DOB: 18-Mar-1955 Today's Date: 09/03/2021   History of Present Illness Pt is a 66 y.o. male who presented 08/02/21 with R foot pain and R-sided weakness. Transferred to [facility] 7/25. 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 S/p stent placement to the R vertebrobasilar junction 7/26, failed extubation. Trach and PEG placed 7/28. S/p bronchoscopic evaluation, oral reintubation and tracheostomy removal with stoma packing 7/31 due to trach site bleeding. S/p trach revision 8/2. PMH: DM, GERD, TBI with prior ICH, HLD, HTN    PT Comments    Patient with limited progress.  Still has no active movement in extremities and needs extended time for head support due to no head control.  He is benefiting from skilled PT for ongoing mobility activities while in acute setting.  Patient remains appropriate for LTACH versus SNF level rehab at d/c.  PT goals downgraded this session.    Recommendations for follow up therapy are one component of a multi-disciplinary discharge planning process, led by the attending physician.  Recommendations may be updated based on patient status, additional functional criteria and insurance authorization.  Follow Up Recommendations  PT at Long-term acute care hospital Can patient physically be transported by private vehicle: No   Assistance Recommended at Discharge Frequent or constant Supervision/Assistance  Patient can return home with the following Assistance with cooking/housework;Two people to help with walking and/or transfers;Two people  to help with bathing/dressing/bathroom;Assistance with feeding;Direct supervision/assist for medications management;Direct supervision/assist for financial management;Assist for transportation;Help with stairs or ramp for entrance   Equipment Recommendations  Other (comment) (TBA)    Recommendations for Other Services       Precautions / Restrictions Precautions Precautions: Fall Precaution Comments: trach collar, peg, SBP < 180, prevalon boots, copious trach secretions, watch HR Restrictions Weight Bearing Restrictions: No     Mobility  Bed Mobility Overal bed mobility: Needs Assistance Bed Mobility: Rolling Rolling: Total assist, +2 for physical assistance, +2 for safety/equipment         General bed mobility comments: Rolling to place maxi pad under pt    Transfers Overall transfer level: Needs assistance   Transfers: Bed to chair/wheelchair/BSC             General transfer comment: transferred to chair via lift; +2 for positioning Transfer via Lift Equipment: Maximove  Ambulation/Gait                   Stairs             Wheelchair Mobility    Modified Rankin (Stroke Patients Only) Modified Rankin (Stroke Patients Only) Pre-Morbid Rankin Score: No symptoms Modified Rankin: Severe disability     Balance   Sitting-balance support: No upper extremity supported Sitting balance-Leahy Scale: Zero Sitting balance - Comments: extra time for positioning in chair for proper head support                                    Cognition Arousal/Alertness: Awake/alert Behavior During Therapy: WFL for tasks assessed/performed Overall Cognitive Status: Difficult to assess  General Comments: Mouthing reponses this session that are appropriate for conversation; SLP continuing to work to provide appropriate adaptive communication system        Exercises Other Exercises Other Exercises: LE  PROM hip flexion, knee flexion, ankle DF/PF, hip abd/add; IR/ER    General Comments General comments (skin integrity, edema, etc.): VSS on 5LPM at 28%      Pertinent Vitals/Pain Pain Assessment Pain Assessment: Faces Faces Pain Scale: Hurts little more Pain Location: grimacing with LE stretching Pain Descriptors / Indicators: Grimacing Pain Intervention(s): Monitored during session, Limited activity within patient's tolerance    Home Living                          Prior Function            PT Goals (current goals can now be found in the care plan section) Acute Rehab PT Goals Patient Stated Goal: unable to state PT Goal Formulation: Patient unable to participate in goal setting Time For Goal Achievement: 09/17/21 Potential to Achieve Goals: Fair Progress towards PT goals: Not progressing toward goals - comment;Goals downgraded-see care plan    Frequency    Min 3X/week      PT Plan Current plan remains appropriate    Co-evaluation              AM-PAC PT "6 Clicks" Mobility   Outcome Measure  Help needed turning from your back to your side while in a flat bed without using bedrails?: Total Help needed moving from lying on your back to sitting on the side of a flat bed without using bedrails?: Total Help needed moving to and from a bed to a chair (including a wheelchair)?: Total Help needed standing up from a chair using your arms (e.g., wheelchair or bedside chair)?: Total Help needed to walk in hospital room?: Total Help needed climbing 3-5 steps with a railing? : Total 6 Click Score: 6    End of Session Equipment Utilized During Treatment: Oxygen;Other (comment) (lift) Activity Tolerance: Patient tolerated treatment well Patient left: in chair;with call bell/phone within reach;Other (comment);with chair alarm set (OT in the room)   PT Visit Diagnosis: Muscle weakness (generalized) (M62.81);Difficulty in walking, not elsewhere classified  (R26.2);Other symptoms and signs involving the nervous system (R29.898) Hemiplegia - Right/Left:  (quadriparesis) Hemiplegia - dominant/non-dominant:  (quadriparesis) Hemiplegia - caused by: Cerebral infarction     Time: 5277-8242 PT Time Calculation (min) (ACUTE ONLY): 30 min  Charges:  $Therapeutic Exercise: 8-22 mins                     Joshua Donovan, PT Acute Rehabilitation Services [phone] 09/03/2021'   [name] 09/03/2021, 7:00 PM

## 2021-09-03 NOTE — Progress Notes (Signed)
Occupational Therapy Treatment Patient Details Name: Joshua Donovan MRN: [id number] DOB: 17-Dec-1955 Today's Date: 09/03/2021   History of present illness Pt is a 66 y.o. male who presented 08/02/21 with R foot pain and R-sided weakness. Transferred to [facility] 7/25. 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 S/p stent placement to the R vertebrobasilar junction 7/26, failed extubation. Trach and PEG placed 7/28. S/p bronchoscopic evaluation, oral reintubation and tracheostomy removal with stoma packing 7/31 due to trach site bleeding. S/p trach revision 8/2. PMH: DM, GERD, TBI with prior ICH, HLD, HTN   OT comments  Pt seen this session for transfer to recliner, then working on PROM of BUE and cervical muscles. Pt tolerating all transfers and bed mobility well, continuing to need total A +2. With PROM of BUE, pt having some tightness and pain/soreness noted on the end ranges of both shoulders. Both hands have mild increased swelling and were left propped up on pillows at end of session. Pt reporting that he needed to have a BM at end of session, with total A +2 RN and OT transferred pt back to bed with maximove for toileting on bed pan. OT will continue to follow.     Recommendations for follow up therapy are one component of a multi-disciplinary discharge planning process, led by the attending physician.  Recommendations may be updated based on patient status, additional functional criteria and insurance authorization.    Follow Up Recommendations  OT at Long-term acute care hospital    Assistance Recommended at Discharge Frequent or  constant Supervision/Assistance  Patient can return home with the following  Two people to help with walking and/or transfers;Two people to help with bathing/dressing/bathroom;Assistance with cooking/housework;Assistance with feeding;Direct supervision/assist for medications management;Direct supervision/assist for financial management;Assist for transportation;Help with stairs or ramp for entrance   Equipment Recommendations  Other (comment) (TBD)    Recommendations for Other Services      Precautions / Restrictions Precautions Precautions: Fall Precaution Comments: trach collar, peg, SBP < 180, prevalon boots, copious trach secretions, watch HR Restrictions Weight Bearing Restrictions: No       Mobility Bed Mobility Overal bed mobility: Needs Assistance Bed Mobility: Rolling Rolling: Total assist, +2 for physical assistance, +2 for safety/equipment         General bed mobility comments: Rolling to place maxi pad under pt    Transfers Overall transfer level: Needs assistance Equipment used: Ambulation equipment used Transfers: Bed to chair/wheelchair/BSC             General transfer comment: Transferred to chair then reported he had to have a BM and had to be transported back to bed for bed pan Transfer via Lift Equipment: Maximove   Balance Overall balance assessment: Needs assistance Sitting-balance support: No upper extremity supported Sitting balance-Leahy Scale: Zero Sitting balance - Comments: extra time for positioning in chair for proper head support                                   ADL either performed or assessed with clinical judgement   ADL Overall ADL's : Needs assistance/impaired  Toilet Transfer: Total assistance;+2 for physical assistance;+2 for safety/equipment Toilet Transfer Details (indicate cue type and reason): transferred from bed to chair and back to bed to be placed on bed pan for  toileting Toileting- Clothing Manipulation and Hygiene: Total assistance;+2 for physical assistance Toileting - Clothing Manipulation Details (indicate cue type and reason): bed level            Extremity/Trunk Assessment              Vision       Perception     Praxis      Cognition Arousal/Alertness: Awake/alert Behavior During Therapy: WFL for tasks assessed/performed Overall Cognitive Status: Difficult to assess                                 General Comments: Mouthing reponses this session that are appropriate for conversation        Exercises Exercises: General Upper Extremity, Other exercises General Exercises - Upper Extremity Shoulder Flexion: PROM, Both, 10 reps, Seated Shoulder Horizontal ABduction: PROM, Both, 10 reps, Seated Shoulder Horizontal ADduction: PROM, Both, 10 reps, Seated Elbow Flexion: PROM, Both, 10 reps, Supine Elbow Extension: PROM, Both, 10 reps, Supine Wrist Flexion: PROM, Both, 10 reps, Supine Wrist Extension: PROM, Both, 10 reps, Supine Digit Composite Flexion: PROM, Both, 10 reps, Supine Composite Extension: PROM, Both, 10 reps, Supine Other Exercises Other Exercises: Cervical ROM: rotation L and R, tilting L and R, flexion and extension    Shoulder Instructions       General Comments VSS on 5LPM at 28%    Pertinent Vitals/ Pain       Pain Assessment Pain Assessment: No/denies pain  Home Living                                          Prior Functioning/Environment              Frequency  Min 2X/week        Progress Toward Goals  OT Goals(current goals can now be found in the care plan section)  Progress towards OT goals: Progressing toward goals  Acute Rehab OT Goals Patient Stated Goal: none stated this session OT Goal Formulation: Patient unable to participate in goal setting Time For Goal Achievement: 09/15/21 Potential to Achieve Goals: Fair ADL Goals Pt/caregiver  will Perform Home Exercise Program: Both right and left upper extremity;With written HEP provided Additional ADL Goal #1: Pt's family will be educated about bed positioning to avoid pressure sores and increase care of skin Additional ADL Goal #2: Pt will use soft touch call bell to call nurses by moving head with min assist. Additional ADL Goal #3: Therapist to explore other options for call bell use if pt unable to move head to use soft touch system.  Plan Discharge plan remains appropriate    Co-evaluation                 AM-PAC OT "6 Clicks" Daily Activity     Outcome Measure   Help from another person eating meals?: Total Help from another person taking care of personal grooming?: Total Help from another person toileting, which includes using toliet, bedpan, or urinal?: Total Help from another person bathing (including washing, rinsing, drying)?: Total Help from another person to put on and taking off regular upper body  clothing?: Total Help from another person to put on and taking off regular lower body clothing?: Total 6 Click Score: 6    End of Session Equipment Utilized During Treatment: Oxygen  OT Visit Diagnosis: Other symptoms and signs involving the nervous system (R29.898)   Activity Tolerance Patient tolerated treatment well   Patient Left with bed alarm set;with call bell/phone within reach;in bed;with nursing/sitter in room   Nurse Communication Mobility status        Time: 9371-6967 OT Time Calculation (min): 40 min  Charges: OT General Charges $OT Visit: 1 Visit OT Treatments $Self Care/Home Management : 8-22 mins $Therapeutic Activity: 8-22 mins $Therapeutic Exercise: 8-22 mins  Paulita Fujita, OTR/L  Jerimie Mancuso Elane Coltan Spinello 09/03/2021, 4:09 PM

## 2021-09-04 LAB — COMPREHENSIVE METABOLIC PANEL
ALT: 75 U/L — ABNORMAL HIGH (ref 0–44)
AST: 38 U/L (ref 15–41)
Albumin: 2.5 g/dL — ABNORMAL LOW (ref 3.5–5.0)
Alkaline Phosphatase: 42 U/L (ref 38–126)
Anion gap: 9 (ref 5–15)
BUN: 19 mg/dL (ref 8–23)
CO2: 27 mmol/L (ref 22–32)
Calcium: 9.1 mg/dL (ref 8.9–10.3)
Chloride: 102 mmol/L (ref 98–111)
Creatinine, Ser: 0.68 mg/dL (ref 0.61–1.24)
GFR, Estimated: >60 mL/min (ref >60)
Glucose, Bld: 142 mg/dL — ABNORMAL HIGH (ref 70–99)
Potassium: 4 mmol/L (ref 3.5–5.1)
Sodium: 138 mmol/L (ref 135–145)
Total Bilirubin: 0.2 mg/dL — ABNORMAL LOW (ref 0.3–1.2)
Total Protein: 6.5 g/dL (ref 6.5–8.1)

## 2021-09-04 LAB — CBC WITH DIFFERENTIAL/PLATELET
Abs Immature Granulocytes: 0.02 10*3/uL (ref 0.00–0.07)
Basophils Absolute: 0.1 10*3/uL (ref 0.0–0.1)
Basophils Relative: 1 %
Eosinophils Absolute: 0.2 10*3/uL (ref 0.0–0.5)
Eosinophils Relative: 2 %
HCT: 38.7 % — ABNORMAL LOW (ref 39.0–52.0)
Hemoglobin: 12.3 g/dL — ABNORMAL LOW (ref 13.0–17.0)
Immature Granulocytes: 0 %
Lymphocytes Relative: 14 %
Lymphs Abs: 1.3 10*3/uL (ref 0.7–4.0)
MCH: 27.7 pg (ref 26.0–34.0)
MCHC: 31.8 g/dL (ref 30.0–36.0)
MCV: 87.2 fL (ref 80.0–100.0)
Monocytes Absolute: 0.8 10*3/uL (ref 0.1–1.0)
Monocytes Relative: 8 %
Neutro Abs: 7 10*3/uL (ref 1.7–7.7)
Neutrophils Relative %: 75 %
Platelets: 291 10*3/uL (ref 150–400)
RBC: 4.44 MIL/uL (ref 4.22–5.81)
RDW: 12.7 % (ref 11.5–15.5)
WBC: 9.4 10*3/uL (ref 4.0–10.5)
nRBC: 0 % (ref 0.0–0.2)

## 2021-09-04 LAB — GLUCOSE, CAPILLARY
Glucose-Capillary: 183 mg/dL — ABNORMAL HIGH (ref 70–99)
Glucose-Capillary: 147 mg/dL — ABNORMAL HIGH (ref 70–99)
Glucose-Capillary: 207 mg/dL — ABNORMAL HIGH (ref 70–99)
Glucose-Capillary: 199 mg/dL — ABNORMAL HIGH (ref 70–99)
Glucose-Capillary: 114 mg/dL — ABNORMAL HIGH (ref 70–99)

## 2021-09-04 LAB — PHOSPHORUS: Phosphorus: 4.7 mg/dL — ABNORMAL HIGH (ref 2.5–4.6)

## 2021-09-04 LAB — MAGNESIUM: Magnesium: 1.7 mg/dL (ref 1.7–2.4)

## 2021-09-04 NOTE — Procedures (Deleted)
Central Venous Catheter Insertion Procedure Note  Joelle Flessner  [id number]  24-Jun-1955  Date:09/04/21  Time:12:04 PM   Provider Performing:Camaria Gerald Cipriano Mile   Procedure: Insertion of Non-tunneled Central Venous [phone]) with US guidance ([id number])   Indication(s) Difficult access  Consent Risks of the procedure as well as the alternatives and risks of each were explained to the patient and/or caregiver.  Consent for the procedure was obtained and is signed in the bedside chart  Anesthesia Topical only with 1% lidocaine   Timeout Verified patient identification, verified procedure, site/side was marked, verified correct patient position, special equipment/implants available, medications/allergies/relevant history reviewed, required imaging and test results available.  Sterile Technique Maximal sterile technique including full sterile barrier drape, hand hygiene, sterile gown, sterile gloves, mask, hair covering, sterile ultrasound probe cover (if used).  Procedure Description Area of catheter insertion was cleaned with chlorhexidine and draped in sterile fashion.  With real-time ultrasound guidance a central venous catheter was placed into the right femoral vein. Nonpulsatile blood flow and easy flushing noted in all ports.  The catheter was sutured in place and sterile dressing applied.  Complications/Tolerance None; patient tolerated the procedure well. Chest X-ray is ordered to verify placement for internal jugular or subclavian cannulation.   Chest x-ray is not ordered for femoral cannulation.  EBL Minimal  Specimen(s) None

## 2021-09-04 NOTE — Progress Notes (Signed)
PROGRESS NOTE    Joshua Donovan  RUE:[id number] DOB: 1955/03/07 DOA: 08/03/2021 PCP: [facility] [place]  No chief complaint on file.   Brief Narrative:  Patient with history of ICH, type II DM, HTN, HLD presented to [facility] on 7/24 with right hand numbness and weakness, initially found to have Heaton Sarin small brainstem stroke and ventral medulla.  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  He was intubated and transferred to [facility] for interventional radiology intervention.  Remained in the intensive care unit.  Now with tracheostomy and PEG tube placement.  Pending LTAC transfer.  7/24 presented to [facility], ventral medulla CVA 7/25 tx to [facility], treated with Cleviprex. 7/26 cerebral angiogram with stent placement to right vertebro-basilar junction, failed extubation, MRI brain> mild extension of stroke, now involving bilateral medial medullary, patent basilar artery and R VBJ stent  7/28 bedside tracheostomy by PCCM and PEG tube placement by general surgery 7/30 Hypertension persist despite adding oral agents, glucose remains elevated as well 7/31 Bleeding around trach site, bright red blood.  Underwent bronchoscopy, Trach Removed, patient orally reintubated, stoma packed. 8/2 ENT took patient to OR for trach redo 8/9 transferred to medical floor on tracheostomy now. 8/12 vomiting after bolus feeding with worsening hypoxia. 8/13 copious bleeding from the heparin injection site controlled with pressure dressing. 8/14 bleeding has resolved.  Trach secretions blood-tinged. 8/15 insurance denied LTAC appeal.  CSW working on SNF. 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 Patient initially loaded with aspirin and Brilinta. Cerebral angiogram performed on 7/26 with placement of Werner Labella stent of right vertebro-basilar junction. Continue aspirin and Brilinta With dense quadriplegia, unable to move extremities Last note from neurology 8/4  Acute respiratory failure with hypoxia [facility]) Patient required ICU admission and mechanical ventilation after admission secondary to worsening mentation. Concern for possible aspiration pneumonitis. Patient unable to extubate. Tracheostomy placed on 7/31 and revised by ENT on 8/2 secondary to bleeding. Currently stable. -PCCM for trach management -Guaifenesin q4 hours to help with secretions -will add scopolamine patch for secretions  Alcohol abuse Initially managed on CIWA. Continue thiamine, multivitamin and folic acid  Mixed hyperlipidemia -Continue Lipitor  Uncontrolled type 2 diabetes mellitus with hyperglycemia (HCC) Most recent hemoglobin A1C of 8.3%. uncontrolled with hyper- and hypoglycemia. -reduce dose of Levemir to 55 units daily and Novolog 10 units every 4 hours  Hypertension Previously with hypertensive emergency which has now resolved. -Continue clonidine, amlodipine, hydrochlorothiazide and Coreg  NSVT (nonsustained ventricular tachycardia) (HCC) Patient with intermittent Vtach. Most recently with 25 beats of vtach on 8/21. QTc of 424 msec -Replete potassium and magnesium -Cardiology consult, maintain mag >2 and k >4, no additioonal cardiac w/u   Dysphagia Patient is s/p PEG tube and is on tube feeds via PEG. -Continue tube feeds and free water  Tracheostomy complication (HCC)-resolved as of 08/25/2021 In setting of DAPT. ENT consulted for revision. Bleeding resolved.      DVT prophylaxis: SCD Code Status:  full Family Communication: none at bedside - daughter  over phone 8/25 Disposition:   Status is: Inpatient Remains inpatient appropriate because: no safe d/c plan   Consultants:  PCCM Neurology ENT  Procedures:  echo  Antimicrobials:  Anti-infectives (From admission, onward)    Start     Dose/Rate Route Frequency Ordered Stop   08/11/21 1400  ceFEPIme (MAXIPIME) 2 g in sodium chloride 0.9 % 100 mL IVPB        2 g 200 mL/hr over 30 Minutes Intravenous Every 8 hours 08/11/21 0957 08/16/21 0633   08/09/21 1300  cefTRIAXone (ROCEPHIN) 1 g in sodium chloride 0.9 % 100 mL IVPB  Status:  Discontinued        1 g 200 mL/hr over 30 Minutes Intravenous Every 24 hours 08/09/21 1151 08/11/21 0957   08/08/21 1200  ceFEPIme (MAXIPIME) 2 g in sodium chloride 0.9 % 100 mL IVPB  Status:  Discontinued        2 g 200 mL/hr over 30 Minutes Intravenous Every 8 hours 08/08/21 1104 08/09/21 1151   08/06/21 1500  cefTRIAXone (ROCEPHIN) 2 g in sodium chloride 0.9 % 100 mL IVPB  Status:  Discontinued        2 g 200 mL/hr over 30 Minutes Intravenous Every 24 hours 08/06/21 1421 08/08/21 1104   08/04/21 1006  ceFAZolin (ANCEF) 2-4 GM/100ML-% IVPB       Note to Pharmacy: Margaretmary Dys E: cabinet override      08/04/21 1006 08/04/21 2214      Subjective: Denies any needs  Objective: Vitals:   09/04/21 1047 09/04/21 1125 09/04/21 1543 09/04/21 1620  BP:  (!) 150/88  113/71  Pulse: 90 85 91 86  Resp: 20 (!) 34 (!) 35 (!) 39  Temp:  98.3 F (36.8 C)  99 F (37.2 C)  TempSrc:  Oral  Oral  SpO2: 98% 95% 97% 98%  Weight:      Height:        Intake/Output Summary (Last 24 hours) at 09/04/2021 1827 Last data filed at 09/04/2021 1645 Gross per 24 hour  Intake --  Output 1300 ml  Net -1300 ml   Filed Weights   08/30/21 0350 08/31/21 0428 09/04/21 0500  Weight: 80.2 kg 80.5 kg 80.5 kg    Examination:  General: No acute distress. Cardiovascular: RRR Lungs: unlabored Abdomen: Soft,  nontender, nondistended - peg  Neurological: mouths words, difficult to understand, dense quadriplegia Extremities: No clubbing or cyanosis. No edema. Data Reviewed: I have personally reviewed following labs and imaging studies  CBC: Recent Labs  Lab 09/01/21 0934 09/02/21 0224 09/03/21 0233 09/04/21 0209  WBC 8.2 9.1 8.5 9.4  NEUTROABS 6.3 6.4 5.9 7.0  HGB 13.0 12.1* 12.8* 12.3*  HCT 39.8 37.0* 38.3* 38.7*  MCV 86.7 86.9 86.1 87.2  PLT 333 325 324 536    Basic Metabolic Panel: Recent Labs  Lab 08/30/21 1935 08/30/21 1949 08/30/21 1949 08/31/21 0953 09/01/21 0934 09/02/21 0224 09/03/21 0233 09/04/21 0209  NA  --  139  --   --  138 137 141 138  K  --  3.3*   < > 3.2* 3.7 3.5 3.7 4.0  CL  --  99  --   --  101 100 102 102  CO2  --  31  --   --  '27 28 29 27  '$ GLUCOSE  --  152*  --   --  220* 164* 92 142*  BUN  --  21  --   --  '18 17 20 '$ 19  CREATININE  --  0.76  --   --  0.67 0.71 0.71 0.68  CALCIUM  --  9.0  --   --  9.0 8.8* 9.4 9.1  MG 1.8  --   --   --  1.7 1.7 2.0 1.7  PHOS  --   --   --   --   --  4.7* 4.8* 4.7*   < > = values in this interval not displayed.    GFR: Estimated Creatinine Clearance: 92.4 mL/min (by C-G formula based on SCr of 0.68 mg/dL).  Liver Function Tests: Recent Labs  Lab 09/01/21 0934 09/02/21 0224 09/03/21 0233 09/04/21 0209  AST 39 33 48* 38  ALT 65* 60* 77* 75*  ALKPHOS 45 39 39 42  BILITOT 0.4 0.3 0.4 0.2*  PROT 6.9 6.3* 6.7 6.5  ALBUMIN 2.6* 2.4* 2.6* 2.5*    CBG: Recent Labs  Lab 09/03/21 2305 09/04/21 0323 09/04/21 0809 09/04/21 1123 09/04/21 1555  GLUCAP 106* 183* 147* 207* 199*     No results found for this or any previous visit (from the past 240 hour(s)).       Radiology Studies: No results found.      Scheduled Meds:  amLODipine  10 mg Per Tube Daily   aspirin  81 mg Per Tube Daily   atorvastatin  80 mg Per Tube Daily   carvedilol  25 mg Per Tube BID WC   cloNIDine  0.1 mg Per Tube TID    feeding supplement (PROSource TF20)  60 mL Per Tube Daily   fiber supplement (BANATROL TF)  60 mL Per Tube BID   folic acid  1 mg Per Tube Daily   free water  150 mL Per Tube Q4H   guaiFENesin  10 mL Per Tube Q4H   hydrochlorothiazide  12.5 mg Per Tube Daily   insulin aspart  10 Units Subcutaneous Q4H   insulin detemir  55 Units Subcutaneous Daily   liver oil-zinc oxide   Topical TID   multivitamin with minerals  1 tablet Per Tube Daily   mouth rinse  15 mL Mouth Rinse Q2H   potassium chloride  40 mEq Per Tube Daily   scopolamine  1 patch Transdermal Q72H   thiamine  100 mg Per Tube Daily   ticagrelor  90 mg Per Tube BID   Continuous Infusions:  feeding supplement ([name]) 1,000 mL (09/04/21 0328)     LOS: 32 days    Time spent: over 30 min    Fayrene Helper, MD [facility]   To contact the attending provider between 7A-7P or the covering provider during after hours 7P-7A, please log into the web site [email or web address] and access using universal [place] password for that web site. If you do not have the password, please call the hospital operator.  09/04/2021, 6:27 PM

## 2021-09-05 LAB — GLUCOSE, CAPILLARY
Glucose-Capillary: 126 mg/dL — ABNORMAL HIGH (ref 70–99)
Glucose-Capillary: 140 mg/dL — ABNORMAL HIGH (ref 70–99)
Glucose-Capillary: 143 mg/dL — ABNORMAL HIGH (ref 70–99)
Glucose-Capillary: 150 mg/dL — ABNORMAL HIGH (ref 70–99)
Glucose-Capillary: 193 mg/dL — ABNORMAL HIGH (ref 70–99)
Glucose-Capillary: 230 mg/dL — ABNORMAL HIGH (ref 70–99)
Glucose-Capillary: 94 mg/dL (ref 70–99)

## 2021-09-05 NOTE — Progress Notes (Signed)
PROGRESS NOTE    Joshua Donovan  PFX:[id number] DOB: January 15, 1955 DOA: 08/03/2021 PCP: [facility] [place]  No chief complaint on file.   Brief Narrative:  Patient with history of ICH, type II DM, HTN, HLD presented to [facility] hospital on 7/24 with right hand numbness and weakness, initially found to have Lamoine Fredricksen small brainstem stroke and ventral medulla.  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  He was intubated and transferred to [facility] for interventional radiology intervention.  Remained in the intensive care unit.  Now with tracheostomy and PEG tube placement.  Pending LTAC transfer.  7/24 presented to [facility], ventral medulla CVA 7/25 tx to [facility], treated with Cleviprex. 7/26 cerebral angiogram with stent placement to right vertebro-basilar junction, failed extubation, MRI brain> mild extension of stroke, now involving bilateral medial medullary, patent basilar artery and R VBJ stent  7/28 bedside tracheostomy by PCCM and PEG tube placement by general surgery 7/30 Hypertension persist despite adding oral agents, glucose remains elevated as well 7/31 Bleeding around trach site, bright red blood.  Underwent bronchoscopy, Trach Removed, patient orally reintubated, stoma packed. 8/2 ENT took patient to OR for trach redo 8/9 transferred to medical floor on tracheostomy now. 8/12 vomiting after bolus feeding with worsening hypoxia. 8/13 copious bleeding from the heparin injection site controlled with pressure dressing. 8/14 bleeding has resolved.  Trach secretions blood-tinged. 8/15 insurance denied LTAC appeal.  CSW working on SNF. 8/16>8/22: 25 beat run of NSVT on 8/21    Assessment & Plan:   Principal Problem:   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[place]) Active Problems:   Acute respiratory failure with hypoxia (HCC)   Hypertension   Uncontrolled type 2 diabetes mellitus with hyperglycemia (HCC)   Mixed hyperlipidemia   Alcohol  abuse   NSVT (nonsustained ventricular tachycardia) ([place])   Dysphagia   Tracheostomy dependent (HCC)   Assessment and Plan: * Acute stroke of medulla oblongata [facility]) Neurology consulted. Patient initially loaded with aspirin and Brilinta. Cerebral angiogram performed on 7/26 with placement of Taiesha Bovard stent of right vertebro-basilar junction. Continue aspirin and Brilinta With dense quadriplegia, unable to move extremities Last note from neurology 8/4  Acute respiratory failure with hypoxia [facility]) Patient required ICU admission and mechanical ventilation after admission secondary to worsening mentation. Concern for possible aspiration pneumonitis. Patient unable to extubate. Tracheostomy placed on 7/31 and revised by ENT on 8/2 secondary to bleeding. Currently stable. -PCCM for trach management -Guaifenesin q4 hours to help with secretions -will add scopolamine patch for secretions  Alcohol abuse Initially managed on CIWA. Continue thiamine, multivitamin and folic acid  Mixed hyperlipidemia -Continue Lipitor  Uncontrolled type 2 diabetes mellitus with hyperglycemia (HCC) Most recent hemoglobin A1C of 8.3%. uncontrolled with hyper- and hypoglycemia. -reduce dose of Levemir to 55 units daily and Novolog 10 units every 4 hours  Hypertension Previously with hypertensive emergency which has now resolved. -Continue clonidine, amlodipine, hydrochlorothiazide and Coreg  NSVT (nonsustained ventricular tachycardia) (HCC) Patient with intermittent Vtach. Most recently with 25 beats of vtach on 8/21. QTc of 424 msec -Replete potassium and magnesium -Cardiology consult, maintain mag >2 and k >4, no additioonal cardiac w/u   Dysphagia Patient is s/p PEG tube and is on tube feeds via PEG. -Continue tube feeds and free water  Tracheostomy complication (HCC)-resolved as of 08/25/2021 In setting of DAPT. ENT consulted for revision. Bleeding resolved.      DVT prophylaxis: SCD Code Status:  full Family Communication: none at bedside - daughter  over phone 8/25 Disposition:   Status is: Inpatient Remains inpatient appropriate because: no safe d/c plan   Consultants:  PCCM Neurology ENT  Procedures:  echo  Antimicrobials:  Anti-infectives (From admission, onward)    Start     Dose/Rate Route Frequency Ordered Stop   08/11/21 1400  ceFEPIme (MAXIPIME) 2 g in sodium chloride 0.9 % 100 mL IVPB        2 g 200 mL/hr over 30 Minutes Intravenous Every 8 hours 08/11/21 0957 08/16/21 0633   08/09/21 1300  cefTRIAXone (ROCEPHIN) 1 g in sodium chloride 0.9 % 100 mL IVPB  Status:  Discontinued        1 g 200 mL/hr over 30 Minutes Intravenous Every 24 hours 08/09/21 1151 08/11/21 0957   08/08/21 1200  ceFEPIme (MAXIPIME) 2 g in sodium chloride 0.9 % 100 mL IVPB  Status:  Discontinued        2 g 200 mL/hr over 30 Minutes Intravenous Every 8 hours 08/08/21 1104 08/09/21 1151   08/06/21 1500  cefTRIAXone (ROCEPHIN) 2 g in sodium chloride 0.9 % 100 mL IVPB  Status:  Discontinued        2 g 200 mL/hr over 30 Minutes Intravenous Every 24 hours 08/06/21 1421 08/08/21 1104   08/04/21 1006  ceFAZolin (ANCEF) 2-4 GM/100ML-% IVPB       Note to Pharmacy: Margaretmary Dys E: cabinet override      08/04/21 1006 08/04/21 2214      Subjective: sleeping  Objective: Vitals:   09/05/21 1014 09/05/21 1222 09/05/21 1433 09/05/21 1502  BP:  110/68  113/72  Pulse: 92 83 83 87  Resp: (!) 32 (!) 27 (!) 31 20  Temp:  98.7 F (37.1 C)  99.1 F (37.3 C)  TempSrc:  Axillary  Oral  SpO2: 92% 96% 96% 95%  Weight:      Height:        Intake/Output Summary (Last 24 hours) at 09/05/2021 1617 Last data filed at 09/05/2021 1500 Gross per 24 hour  Intake --  Output 1550 ml  Net -1550 ml   Filed Weights   08/31/21 0428 09/04/21 0500 09/05/21 0500  Weight: 80.5 kg 80.5 kg 80.5 kg    Examination:  General: No acute distress. Lungs: unlabored, trach Neurological: sleeping, I did not wake him  today Extremities: No clubbing or cyanosis. No edema.  Data Reviewed: I have personally reviewed following labs and imaging studies  CBC: Recent Labs  Lab 09/01/21 0934 09/02/21 0224 09/03/21 0233 09/04/21 0209  WBC 8.2 9.1 8.5 9.4  NEUTROABS 6.3 6.4 5.9 7.0  HGB 13.0 12.1* 12.8* 12.3*  HCT 39.8 37.0* 38.3* 38.7*  MCV 86.7 86.9 86.1 87.2  PLT 333 325 324 784    Basic Metabolic Panel: Recent Labs  Lab 08/30/21 1935 08/30/21 1949 08/30/21 1949 08/31/21 0953 09/01/21 0934 09/02/21 0224 09/03/21 0233 09/04/21 0209  NA  --  139  --   --  138 137 141 138  K  --  3.3*   < > 3.2* 3.7 3.5 3.7 4.0  CL  --  99  --   --  101 100 102 102  CO2  --  31  --   --  '27 28 29 27  '$ GLUCOSE  --  152*  --   --  220* 164* 92 142*  BUN  --  21  --   --  '18 17 20 19  '$ CREATININE  --  0.76  --   --  0.67 0.71 0.71 0.68  CALCIUM  --  9.0  --   --  9.0 8.8* 9.4 9.1  MG 1.8  --   --   --  1.7 1.7 2.0 1.7  PHOS  --   --   --   --   --  4.7* 4.8* 4.7*   < > = values in this interval not displayed.    GFR: Estimated Creatinine Clearance: 92.4 mL/min (by C-G formula based on SCr of 0.68 mg/dL).  Liver Function Tests: Recent Labs  Lab 09/01/21 0934 09/02/21 0224 09/03/21 0233 09/04/21 0209  AST 39 33 48* 38  ALT 65* 60* 77* 75*  ALKPHOS 45 39 39 42  BILITOT 0.4 0.3 0.4 0.2*  PROT 6.9 6.3* 6.7 6.5  ALBUMIN 2.6* 2.4* 2.6* 2.5*    CBG: Recent Labs  Lab 09/05/21 0007 09/05/21 0310 09/05/21 0744 09/05/21 1147 09/05/21 1517  GLUCAP 193* 126* 150* 230* 143*     No results found for this or any previous visit (from the past 240 hour(s)).       Radiology Studies: No results found.      Scheduled Meds:  amLODipine  10 mg Per Tube Daily   aspirin  81 mg Per Tube Daily   atorvastatin  80 mg Per Tube Daily   carvedilol  25 mg Per Tube BID WC   cloNIDine  0.1 mg Per Tube TID   feeding supplement (PROSource TF20)  60 mL Per Tube Daily   fiber supplement (BANATROL TF)  60 mL  Per Tube BID   folic acid  1 mg Per Tube Daily   free water  150 mL Per Tube Q4H   guaiFENesin  10 mL Per Tube Q4H   hydrochlorothiazide  12.5 mg Per Tube Daily   insulin aspart  10 Units Subcutaneous Q4H   insulin detemir  55 Units Subcutaneous Daily   liver oil-zinc oxide   Topical TID   multivitamin with minerals  1 tablet Per Tube Daily   mouth rinse  15 mL Mouth Rinse Q2H   potassium chloride  40 mEq Per Tube Daily   scopolamine  1 patch Transdermal Q72H   thiamine  100 mg Per Tube Daily   ticagrelor  90 mg Per Tube BID   Continuous Infusions:  feeding supplement ([name]) 1,000 mL (09/05/21 0304)     LOS: 33 days    Time spent: over 30 min    Fayrene Helper, MD [facility]   To contact the attending provider between 7A-7P or the covering provider during after hours 7P-7A, please log into the web site [email or web address] and access using universal  password for that web site. If you do not have the password, please call the hospital operator.  09/05/2021, 4:17 PM

## 2021-09-06 ENCOUNTER — Inpatient Hospital Stay (HOSPITAL_COMMUNITY): Payer: Medicare PPO

## 2021-09-06 LAB — CBC WITH DIFFERENTIAL/PLATELET
Abs Immature Granulocytes: 0.03 10*3/uL (ref 0.00–0.07)
Basophils Absolute: 0.1 10*3/uL (ref 0.0–0.1)
Basophils Relative: 1 %
Eosinophils Absolute: 0.2 10*3/uL (ref 0.0–0.5)
Eosinophils Relative: 3 %
HCT: 38.1 % — ABNORMAL LOW (ref 39.0–52.0)
Hemoglobin: 12.7 g/dL — ABNORMAL LOW (ref 13.0–17.0)
Immature Granulocytes: 0 %
Lymphocytes Relative: 18 %
Lymphs Abs: 1.5 10*3/uL (ref 0.7–4.0)
MCH: 28.3 pg (ref 26.0–34.0)
MCHC: 33.3 g/dL (ref 30.0–36.0)
MCV: 85 fL (ref 80.0–100.0)
Monocytes Absolute: 0.8 10*3/uL (ref 0.1–1.0)
Monocytes Relative: 10 %
Neutro Abs: 5.7 10*3/uL (ref 1.7–7.7)
Neutrophils Relative %: 68 %
Platelets: 278 10*3/uL (ref 150–400)
RBC: 4.48 MIL/uL (ref 4.22–5.81)
RDW: 12.7 % (ref 11.5–15.5)
WBC: 8.3 10*3/uL (ref 4.0–10.5)
nRBC: 0 % (ref 0.0–0.2)

## 2021-09-06 LAB — COMPREHENSIVE METABOLIC PANEL
ALT: 61 U/L — ABNORMAL HIGH (ref 0–44)
AST: 27 U/L (ref 15–41)
Albumin: 2.5 g/dL — ABNORMAL LOW (ref 3.5–5.0)
Alkaline Phosphatase: 39 U/L (ref 38–126)
Anion gap: 10 (ref 5–15)
BUN: 17 mg/dL (ref 8–23)
CO2: 28 mmol/L (ref 22–32)
Calcium: 8.8 mg/dL — ABNORMAL LOW (ref 8.9–10.3)
Chloride: 99 mmol/L (ref 98–111)
Creatinine, Ser: 0.63 mg/dL (ref 0.61–1.24)
GFR, Estimated: >60 mL/min (ref >60)
Glucose, Bld: 115 mg/dL — ABNORMAL HIGH (ref 70–99)
Potassium: 3.6 mmol/L (ref 3.5–5.1)
Sodium: 137 mmol/L (ref 135–145)
Total Bilirubin: 0.4 mg/dL (ref 0.3–1.2)
Total Protein: 6.5 g/dL (ref 6.5–8.1)

## 2021-09-06 LAB — GLUCOSE, CAPILLARY
Glucose-Capillary: 132 mg/dL — ABNORMAL HIGH (ref 70–99)
Glucose-Capillary: 137 mg/dL — ABNORMAL HIGH (ref 70–99)
Glucose-Capillary: 183 mg/dL — ABNORMAL HIGH (ref 70–99)
Glucose-Capillary: 120 mg/dL — ABNORMAL HIGH (ref 70–99)
Glucose-Capillary: 143 mg/dL — ABNORMAL HIGH (ref 70–99)
Glucose-Capillary: 171 mg/dL — ABNORMAL HIGH (ref 70–99)

## 2021-09-06 LAB — PHOSPHORUS: Phosphorus: 4.2 mg/dL (ref 2.5–4.6)

## 2021-09-06 LAB — MAGNESIUM: Magnesium: 1.7 mg/dL (ref 1.7–2.4)

## 2021-09-06 MED ORDER — SODIUM CHLORIDE 3 % IN NEBU
4.0000 mL | INHALATION_SOLUTION | Freq: Two times a day (BID) | RESPIRATORY_TRACT | Status: AC
  Administered 2021-09-07 – 2021-09-09 (×5): 4 mL via RESPIRATORY_TRACT
  Filled 2021-09-06 (×6): qty 4

## 2021-09-06 NOTE — Progress Notes (Signed)
Physical Therapy Treatment Patient Details Name: Joshua Donovan MRN: [id number] DOB: 08-12-1955 Today's Date: 09/06/2021   History of Present Illness Pt is a 66 y.o. male who presented 08/02/21 with R foot pain and R-sided weakness. Transferred to [facility] 7/25. 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 S/p stent placement to the R vertebrobasilar junction 7/26, failed extubation. Trach and PEG placed 7/28. S/p bronchoscopic evaluation, oral reintubation and tracheostomy removal with stoma packing 7/31 due to trach site bleeding. S/p trach revision 8/2. PMH: DM, GERD, TBI with prior ICH, HLD, HTN    PT Comments    Patient progressing with positioning efforts in tilt in space borrowed from rehab.  Able to position head with strap for improved safety and use of roho cushion as well for positioning but mainly prevention of pressure sores.  Noted R LE spasms at times during mobility this session.  Patient remains appropriate for long term acute care setting.  PT will continue to follow acutely.    Recommendations for follow up therapy are one component of a multi-disciplinary discharge planning process, led by the attending physician.  Recommendations may be updated based on patient status, additional functional criteria and insurance authorization.  Follow Up Recommendations  PT at Long-term acute care hospital     Assistance Recommended at Discharge Frequent or constant Supervision/Assistance  Patient can return home with the following Assistance with cooking/housework;Two people to help with walking and/or transfers;Two people to help  with bathing/dressing/bathroom;Assistance with feeding;Direct supervision/assist for medications management;Direct supervision/assist for financial management;Assist for transportation;Help with stairs or ramp for entrance   Equipment Recommendations  Other (comment) (TBA)    Recommendations for Other Services       Precautions / Restrictions Precautions Precautions: Fall Precaution Comments: trach collar, peg, SBP < 180, prevalon boots, copious trach secretions, watch HR     Mobility  Bed Mobility Overal bed mobility: Needs Assistance   Rolling: Total assist, +2 for physical assistance, +2 for safety/equipment         General bed mobility comments: Rolling to place maxi pad under pt    Transfers     Transfers: Bed to chair/wheelchair/BSC             General transfer comment: transferred to tilt in space wheelchair via lift; +2 for positioning Transfer via Lift Equipment: Maximove  Ambulation/Gait                   Stairs             Wheelchair Mobility    Modified Rankin (Stroke Patients Only) Modified Rankin (Stroke Patients Only) Pre-Morbid Rankin Score: No symptoms Modified Rankin: Severe disability     Balance Overall balance assessment: Needs assistance   Sitting balance-Leahy Scale: Zero Sitting balance - Comments: assist for positioning in tilt in space for safety and pressure relief                                    Cognition Arousal/Alertness: Awake/alert Behavior During Therapy: WFL for tasks assessed/performed Overall Cognitive Status: Difficult to assess  General Comments: awake and mouths single word responses appropriately, but when attempting more, hard to understand        Exercises Other Exercises Other Exercises: LE PROM hip flexion, knee flexion, ankle DF/PF, hip abd/add; IR/ER    General Comments        Pertinent Vitals/Pain Pain  Assessment Faces Pain Scale: No hurt    Home Living                          Prior Function            PT Goals (current goals can now be found in the care plan section) Progress towards PT goals: Progressing toward goals    Frequency    Min 3X/week      PT Plan Current plan remains appropriate    Co-evaluation              AM-PAC PT "6 Clicks" Mobility   Outcome Measure  Help needed turning from your back to your side while in a flat bed without using bedrails?: Total Help needed moving from lying on your back to sitting on the side of a flat bed without using bedrails?: Total Help needed moving to and from a bed to a chair (including a wheelchair)?: Total Help needed standing up from a chair using your arms (e.g., wheelchair or bedside chair)?: Total Help needed to walk in hospital room?: Total Help needed climbing 3-5 steps with a railing? : Total 6 Click Score: 6    End of Session Equipment Utilized During Treatment: Oxygen Activity Tolerance: Patient tolerated treatment well Patient left: in chair;with call bell/phone within reach;with chair alarm set Nurse Communication: Mobility status;Need for lift equipment PT Visit Diagnosis: Muscle weakness (generalized) (M62.81);Other symptoms and signs involving the nervous system (R29.898);Other abnormalities of gait and mobility (R26.89);Other (comment) Hemiplegia - Right/Left:  (Quadriparesis.) Hemiplegia - caused by: Cerebral infarction     Time: 0981-1914 PT Time Calculation (min) (ACUTE ONLY): 40 min  Charges:  $Therapeutic Exercise: 8-22 mins $Therapeutic Activity: 23-37 mins                     [name], PT Acute Rehabilitation Services [phone] 09/06/2021    [name] 09/06/2021, 6:12 PM

## 2021-09-06 NOTE — Progress Notes (Signed)
Attending:   Seen and examined with Dr. Simeon Craft, discussed with her  Subjective: Thick, copious secretions  Objective: Vitals:   09/06/21 0400 09/06/21 0500 09/06/21 0822 09/06/21 0832  BP: 131/76  (!) 144/82 (!) 144/82  Pulse:   85 84  Resp: (!) 22  (!) 33 (!) 22  Temp: 99 F (37.2 C)  98.6 F (37 C)   TempSrc: Oral  Axillary   SpO2:   94% 95%  Weight:  80.5 kg    Height:       FiO2 (%):  [28 %] 28 %  Intake/Output Summary (Last 24 hours) at 09/06/2021 1110 Last data filed at 09/05/2021 2300 Gross per 24 hour  Intake 5250 ml  Output 1500 ml  Net 3750 ml    General:  Resting comfortably in bed HENT: NCAT OP clear tracheostmy with tenacious secretions, grey in color PULM: CTA B, normal effort CV: RRR, no mgr GI: BS+, soft, nontender MSK: diminished bulk and tone Neuro: awake, communicates with eyes    CBC    Component Value Date/Time   WBC 8.3 09/06/2021 0247   RBC 4.48 09/06/2021 0247   HGB 12.7 (L) 09/06/2021 0247   HCT 38.1 (L) 09/06/2021 0247   PLT 278 09/06/2021 0247   MCV 85.0 09/06/2021 0247   MCH 28.3 09/06/2021 0247   MCHC 33.3 09/06/2021 0247   RDW 12.7 09/06/2021 0247   LYMPHSABS 1.5 09/06/2021 0247   MONOABS 0.8 09/06/2021 0247   EOSABS 0.2 09/06/2021 0247   BASOSABS 0.1 09/06/2021 0247    BMET    Component Value Date/Time   NA 137 09/06/2021 0247   K 3.6 09/06/2021 0247   CL 99 09/06/2021 0247   CO2 28 09/06/2021 0247   GLUCOSE 115 (H) 09/06/2021 0247   BUN 17 09/06/2021 0247   CREATININE 0.63 09/06/2021 0247   CALCIUM 8.8 (L) 09/06/2021 0247   GFRNONAA >60 09/06/2021 0247   GFRAA >60 07/20/2015 0335    CXR images none recent  Impression: Ventral medullary stroke Tracheostomy status Mucopurulent bronchitis Tracheostomy site bleeding and dislodgment, s/p ENT revision HCAP> several episodes this admission  Discussion: The patient reports no preceding chronic lung disease so I think that the secretions he is having is  related to colonization of his airways from a prolonged hospitalization, tracheostomy status and prior pneumonia.    Plan: Would stop scopolamine, will likely make secretions thicker Start hypertonic saline neb scheduled bid for mucociliary clearance Guaifenesin Resp culture CXR Trach care per routine otherwise  Rest per resident note  My cc time n/a minutes  Roselie Awkward, MD [place] [facility] Pager: [phone] Cell: [phone] After 7pm: [phone]

## 2021-09-06 NOTE — Progress Notes (Signed)
PROGRESS NOTE    Joshua Donovan  DXI:[id number] DOB: 1955-08-04 DOA: 08/03/2021 PCP: [facility] [place]  No chief complaint on file.   Brief Narrative:  Patient with history of ICH, type II DM, HTN, HLD presented to [facility] on 7/24 with right hand numbness and weakness, initially found to have Joshua Donovan small brainstem stroke and ventral medulla.  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  He was intubated and transferred to [facility] for interventional radiology intervention.  Remained in the intensive care unit.  Now with tracheostomy and PEG tube placement.  Pending LTAC transfer.  7/24 presented to [facility], ventral medulla CVA 7/25 tx to [facility], treated with Cleviprex. 7/26 cerebral angiogram with stent placement to right vertebro-basilar junction, failed extubation, MRI brain> mild extension of stroke, now involving bilateral medial medullary, patent basilar artery and R VBJ stent  7/28 bedside tracheostomy by PCCM and PEG tube placement by general surgery 7/30 Hypertension persist despite adding oral agents, glucose remains elevated as well 7/31 Bleeding around trach site, bright red blood.  Underwent bronchoscopy, Trach Removed, patient orally reintubated, stoma packed. 8/2 ENT took patient to OR for trach redo 8/9 transferred to medical floor on tracheostomy now. 8/12 vomiting after bolus feeding with worsening hypoxia. 8/13 copious bleeding from the heparin injection site controlled with pressure dressing. 8/14 bleeding has resolved.  Trach secretions blood-tinged. 8/15 insurance denied LTAC appeal.  CSW working on SNF. 8/16>8/22: 25 beat run of NSVT on 8/21    Assessment & Plan:   Principal Problem:   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Joshua Donovan) Active Problems:   Acute respiratory failure with hypoxia (HCC)   Hypertension   Uncontrolled type 2 diabetes mellitus with hyperglycemia (HCC)   Mixed hyperlipidemia   Alcohol  abuse   NSVT (nonsustained ventricular tachycardia) ([place])   Dysphagia   Tracheostomy dependent (HCC)   Assessment and Plan: * Acute stroke of medulla oblongata [facility]) Neurology consulted. Patient initially loaded with aspirin and Brilinta. Cerebral angiogram performed on 7/26 with placement of Joshua Donovan stent of right vertebro-basilar junction. 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 Concern for possible aspiration pneumonitis. Patient unable to extubate. Tracheostomy placed on 7/31 and revised by ENT on 8/2 secondary to bleeding. Currently stable. -PCCM for trach management -Guaifenesin q4 hours to help with secretions -hypertonic saline neb BID for mucociliary clearance -follow resp culture, CXR today with new L basilar opacities, atelectasis or pneumonia (will hold off on abx for now, follow cx, fever curve, wbc count)  Alcohol abuse Initially managed on CIWA. Continue thiamine, multivitamin and folic acid  Mixed hyperlipidemia -Continue Lipitor  Uncontrolled type 2 diabetes mellitus with hyperglycemia (HCC) Most recent hemoglobin A1C of 8.3%. uncontrolled with hyper- and hypoglycemia. -reduce dose of Levemir to 55 units daily and Novolog 10 units every 4 hours  Hypertension Previously with hypertensive emergency which has now resolved. -Continue clonidine, amlodipine, hydrochlorothiazide and Coreg  NSVT (nonsustained ventricular tachycardia) (HCC) Patient with intermittent Vtach. Most recently with 25 beats of vtach on 8/21. QTc of 424 msec -Replete potassium and magnesium -Cardiology consult, maintain mag >2 and k >4, no additioonal cardiac w/u   Dysphagia Patient is s/p PEG tube and is on tube feeds via PEG. -Continue tube feeds and free  water  Tracheostomy complication (HCC)-resolved as of 08/25/2021 In  setting of DAPT. ENT consulted for revision. Bleeding resolved.      DVT prophylaxis: SCD Code Status: full Family Communication: none at bedside - daughter over phone 8/25 - neighbor at bedside 8/28 Disposition:   Status is: Inpatient Remains inpatient appropriate because: no safe d/c plan   Consultants:  PCCM Neurology ENT  Procedures:  echo  Antimicrobials:  Anti-infectives (From admission, onward)    Start     Dose/Rate Route Frequency Ordered Stop   08/11/21 1400  ceFEPIme (MAXIPIME) 2 g in sodium chloride 0.9 % 100 mL IVPB        2 g 200 mL/hr over 30 Minutes Intravenous Every 8 hours 08/11/21 0957 08/16/21 0633   08/09/21 1300  cefTRIAXone (ROCEPHIN) 1 g in sodium chloride 0.9 % 100 mL IVPB  Status:  Discontinued        1 g 200 mL/hr over 30 Minutes Intravenous Every 24 hours 08/09/21 1151 08/11/21 0957   08/08/21 1200  ceFEPIme (MAXIPIME) 2 g in sodium chloride 0.9 % 100 mL IVPB  Status:  Discontinued        2 g 200 mL/hr over 30 Minutes Intravenous Every 8 hours 08/08/21 1104 08/09/21 1151   08/06/21 1500  cefTRIAXone (ROCEPHIN) 2 g in sodium chloride 0.9 % 100 mL IVPB  Status:  Discontinued        2 g 200 mL/hr over 30 Minutes Intravenous Every 24 hours 08/06/21 1421 08/08/21 1104   08/04/21 1006  ceFAZolin (ANCEF) 2-4 GM/100ML-% IVPB       Note to Pharmacy: Joshua Donovan E: cabinet override      08/04/21 1006 08/04/21 2214      Subjective: In good spirits today, his neighbor is here, helping care for him Denies pain/discomfort  Objective: Vitals:   09/06/21 1104 09/06/21 1137 09/06/21 1550 09/06/21 1607  BP:  129/78 118/80   Pulse:  85 80   Resp:  (!) 21 (!) 24   Temp:  98.5 F (36.9 C) 98.1 F (36.7 C)   TempSrc:  Oral Oral   SpO2: 93% 93% 98% 97%  Weight:      Height:        Intake/Output Summary (Last 24 hours) at 09/06/2021 1729 Last data filed at 09/06/2021 1650 Gross  per 24 hour  Intake 6150 ml  Output 1750 ml  Net 4400 ml   Filed Weights   09/04/21 0500 09/05/21 0500 09/06/21 0500  Weight: 80.5 kg 80.5 kg 80.5 kg    Examination:  General: No acute distress. Cardiovascular: RRR Lungs: unlabored Abdomen: Soft, nontender, nondistended Neurological: mouths words, dense quadriplegia, able to lift his head today Extremities: No clubbing or cyanosis. No edema.   Data Reviewed: I have personally reviewed following labs and imaging studies  CBC: Recent Labs  Lab 09/01/21 0934 09/02/21 0224 09/03/21 0233 09/04/21 0209 09/06/21 0247  WBC 8.2 9.1 8.5 9.4 8.3  NEUTROABS 6.3 6.4 5.9 7.0 5.7  HGB 13.0 12.1* 12.8* 12.3* 12.7*  HCT 39.8 37.0* 38.3* 38.7* 38.1*  MCV 86.7 86.9 86.1 87.2 85.0  PLT 333 325 324 291 456    Basic Metabolic Panel: Recent Labs  Lab 09/01/21 0934 09/02/21 0224 09/03/21 0233 09/04/21 0209 09/06/21 0247  NA 138 137 141 138 137  K 3.7 3.5 3.7 4.0 3.6  CL 101 100 102 102 99  CO2 '27 28 29 27 28  '$ GLUCOSE 220* 164* 92 142* 115*  BUN '18 17 20 19 17  '$ CREATININE 0.67 0.71 0.71 0.68 0.63  CALCIUM 9.0 8.8* 9.4 9.1 8.8*  MG 1.7 1.7 2.0 1.7 1.7  PHOS  --  4.7* 4.8* 4.7* 4.2    GFR: Estimated Creatinine Clearance: 92.4 mL/min (by C-G formula based on SCr of 0.63 mg/dL).  Liver Function Tests: Recent Labs  Lab 09/01/21 0934 09/02/21 0224 09/03/21 0233 09/04/21 0209 09/06/21 0247  AST 39 33 48* 38 27  ALT 65* 60* 77* 75* 61*  ALKPHOS 45 39 39 42 39  BILITOT 0.4 0.3 0.4 0.2* 0.4  PROT 6.9 6.3* 6.7 6.5 6.5  ALBUMIN 2.6* 2.4* 2.6* 2.5* 2.5*    CBG: Recent Labs  Lab 09/05/21 2318 09/06/21 0411 09/06/21 0727 09/06/21 1145 09/06/21 1656  GLUCAP 140* 132* 137* 183* 120*     No results found for this or any previous visit (from the past 240 hour(s)).       Radiology Studies: DG CHEST PORT 1 VIEW  Result Date: 09/06/2021 CLINICAL DATA:  Bronchitis EXAM: PORTABLE CHEST 1 VIEW COMPARISON:  Radiograph  08/22/2021 FINDINGS: Tracheostomy tube overlies the trachea. Unchanged cardiomediastinal silhouette. New left basilar opacities. No large effusion. No pneumothorax. Bones are unchanged. IMPRESSION: New left basilar opacities, which could be atelectasis or pneumonia. Electronically Signed   By: Maurine Simmering M.D.   On: 09/06/2021 16:49        Scheduled Meds:  amLODipine  10 mg Per Tube Daily   aspirin  81 mg Per Tube Daily   atorvastatin  80 mg Per Tube Daily   carvedilol  25 mg Per Tube BID WC   cloNIDine  0.1 mg Per Tube TID   feeding supplement (PROSource TF20)  60 mL Per Tube Daily   fiber supplement (BANATROL TF)  60 mL Per Tube BID   folic acid  1 mg Per Tube Daily   free water  150 mL Per Tube Q4H   guaiFENesin  10 mL Per Tube Q4H   hydrochlorothiazide  12.5 mg Per Tube Daily   insulin aspart  10 Units Subcutaneous Q4H   insulin detemir  55 Units Subcutaneous Daily   liver oil-zinc oxide   Topical TID   multivitamin with minerals  1 tablet Per Tube Daily   mouth rinse  15 mL Mouth Rinse Q2H   potassium chloride  40 mEq Per Tube Daily   scopolamine  1 patch Transdermal Q72H   sodium chloride HYPERTONIC  4 mL Nebulization BID   thiamine  100 mg Per Tube Daily   ticagrelor  90 mg Per Tube BID   Continuous Infusions:  feeding supplement ([name]) 1,000 mL (09/06/21 0225)     LOS: 34 days    Time spent: over 30 min    Fayrene Helper, MD [facility]   To contact the attending provider between 7A-7P or the covering provider during after hours 7P-7A, please log into the web site [email or web address] and access using universal [place] password for that web site. If you do not have the password, please call the hospital operator.  09/06/2021, 5:29 PM

## 2021-09-06 NOTE — Progress Notes (Signed)
Pt having copious/continuous pink-tinged sputum pour out of his trach. Used yankauer to suction. Pt also desating to 88%-90%. This RN called RT. 02 was increased from 5L 28% to 8L 35%. RT agrees to relay to MD.  Justice Rocher, RN

## 2021-09-06 NOTE — Progress Notes (Signed)
NAME:  Joshua Donovan, MRN:  [id number], DOB:  March 30, 1955, LOS: 54 ADMISSION DATE:  08/03/2021, CONSULTATION DATE:  7/26 REFERRING MD:  Colvin Caroli FOR CONSULT:  CVA   History of Present Illness:  66 y/o male presented to [facility] on 7/24 with R hand numbness and weakness, found to have small brainstem stroke in ventral medulla.  Symptoms worsened and required transfer to [facility] for neuro IR intervention where he had a stent placed in R vertebrovasilar junction.  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  He had a tracheostomy performed on 7/26 c/b dislodgement and ENT revision on 8/2.  Pertinent  Medical History  TBI with ICH, DM, HTN, HLD  Significant Hospital Events: Including procedures, antibiotic start and stop dates in addition to other pertinent events    7/24 presented to [facility], ventral medulla CVA 7/25 tx to [facility] 7/26 cerebral angiogram with stent placement to right vertebro-basilar junction, failed extubation, left radial aline MRI brain> mild extension of stroke, now involving bilateral medial medullary, patent basilar artery and R VBJ stent  7/28 underwent bedside percutaneous tracheostomy and PEG tube placement, tolerated well 7/29 no acute issues overnight currently on SBT trial this a.m. and tolerating well, Cleviprex resumed earlier this a.m. due to severe hypertension 7/30 Hypertension persist despite adding oral agents, glucose remains elevated as well 7/31 Bleeding around trach site, bright red blood. Copious bloody secretions. Cleviprex off, SBPs 150s-160s. Basal insulin/TF coverage increased. Cefepime deescalated to ceftriaxone. CXR stable. Later in afternoon, continued peritrach bleeding. Bronchoscopic eval completed with intratracheal bleeding associated with trach. Removed, patient orally reintubated, stoma packed. 8/1 ENT consulted for trach revision. Lightly sedated. Vent full support/PRVC, given fatigue following events of yesterday. ENT attempted to  evaluate trach at bedside, could not pass scope. Plan for OR 8/2. ASA/Brilinta held. 8/2 to OR for trach redo 8/6 trach collar during day shift, back on vent overnight 8/10 Hemoptysis, blood with suctioning > improved 8/11 8/14 on 8L / 35% ATC  8/28: Stable vitals, increased tenacious secretions from trach. CXR suggesting new LLL basilar opacities atelectasis vs pna  Interim History / Subjective:   Patient appears comfortable in bed, lying with eyes open. Able to understand and communicates by mouthing words, blinking to respond to YES/NO questions.   Patient with distant history of smoking <5 cigarettes a day, has not used for over 30 years. Denies chills, sweats, chest pain, difficulty breathing or sleeping. No pain from tracheostomy site.   Per nursing, increased secretions, asking for recommendations.  Objective   Blood pressure 118/80, pulse 80, temperature 98.1 F (36.7 C), temperature source Oral, resp. rate (!) 24, height '5\' 7"'$  (1.702 m), weight 80.5 kg, SpO2 98 %.    FiO2 (%):  [28 %-35 %] 35 %   Intake/Output Summary (Last 24 hours) at 09/06/2021 1658 Last data filed at 09/06/2021 1650 Gross per 24 hour  Intake 6150 ml  Output 1750 ml  Net 4400 ml   Filed Weights   09/04/21 0500 09/05/21 0500 09/06/21 0500  Weight: 80.5 kg 80.5 kg 80.5 kg    Examination: Chronically ill-appearing gentleman lying in bed HEENT: Tracheostomy in place, no purulence, erythema, skin breakdown noted on exam. Bandage in place for protection. Increased off-white, tenacious secretions noted in suction cup and trach collar. No yellow or green sputum noted Heart: Regular heart and rhythm.  Lungs: Not increased work of breathing today. Mild ronchi noted anteriorly. Abdomen: Soft nontender nondistended Neuro: Alert, communicating with full EOM.  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 Stable otherwise. CXR repeat today showed L lower lobe opacities suggesting atelectasis vs pneumonia; however, difficult to compare to previous CXR on 8/13 given positioning. Cultures were collected today. Will wait for speciation to guide antibiotic therapy if required. Patient recently completes 5 day course of CTX for enterobacter pna. Patient is likely colonized given prolonged hospitalization. If clinical presentation deteriorates or increased oxygen requirement, will consider empiric coverage.   For secretion management, do not favor inhaled steroids or SABA as there is no chronic history of parenchymal lung disease or significant smoking history. Will add 3% hypertonic saline solution nebs for mucolytic properties.    Plan: -Continue routine trach care -Appreciate help with RT, speech. -Start 3%hypertonic saline nebs BID -Continue Guaifenesin -Follow respiratory cultures -Consider antibiotic therapy if clinical decline -Stop scopolamine patch   Romana Juniper, MD 09/06/2021 4:58 PM

## 2021-09-07 LAB — CBC WITH DIFFERENTIAL/PLATELET
Abs Immature Granulocytes: 0.02 10*3/uL (ref 0.00–0.07)
Basophils Absolute: 0.1 10*3/uL (ref 0.0–0.1)
Basophils Relative: 1 %
Eosinophils Absolute: 0.2 10*3/uL (ref 0.0–0.5)
Eosinophils Relative: 2 %
HCT: 37.9 % — ABNORMAL LOW (ref 39.0–52.0)
Hemoglobin: 12.3 g/dL — ABNORMAL LOW (ref 13.0–17.0)
Immature Granulocytes: 0 %
Lymphocytes Relative: 14 %
Lymphs Abs: 1.2 10*3/uL (ref 0.7–4.0)
MCH: 28.3 pg (ref 26.0–34.0)
MCHC: 32.5 g/dL (ref 30.0–36.0)
MCV: 87.1 fL (ref 80.0–100.0)
Monocytes Absolute: 0.7 10*3/uL (ref 0.1–1.0)
Monocytes Relative: 9 %
Neutro Abs: 6.1 10*3/uL (ref 1.7–7.7)
Neutrophils Relative %: 74 %
Platelets: 263 10*3/uL (ref 150–400)
RBC: 4.35 MIL/uL (ref 4.22–5.81)
RDW: 12.8 % (ref 11.5–15.5)
WBC: 8.3 10*3/uL (ref 4.0–10.5)
nRBC: 0 % (ref 0.0–0.2)

## 2021-09-07 LAB — COMPREHENSIVE METABOLIC PANEL
ALT: 64 U/L — ABNORMAL HIGH (ref 0–44)
AST: 31 U/L (ref 15–41)
Albumin: 2.5 g/dL — ABNORMAL LOW (ref 3.5–5.0)
Alkaline Phosphatase: 39 U/L (ref 38–126)
Anion gap: 10 (ref 5–15)
BUN: 15 mg/dL (ref 8–23)
CO2: 26 mmol/L (ref 22–32)
Calcium: 8.7 mg/dL — ABNORMAL LOW (ref 8.9–10.3)
Chloride: 97 mmol/L — ABNORMAL LOW (ref 98–111)
Creatinine, Ser: 0.57 mg/dL — ABNORMAL LOW (ref 0.61–1.24)
GFR, Estimated: >60 mL/min (ref >60)
Glucose, Bld: 157 mg/dL — ABNORMAL HIGH (ref 70–99)
Potassium: 3.3 mmol/L — ABNORMAL LOW (ref 3.5–5.1)
Sodium: 133 mmol/L — ABNORMAL LOW (ref 135–145)
Total Bilirubin: 0.3 mg/dL (ref 0.3–1.2)
Total Protein: 6.2 g/dL — ABNORMAL LOW (ref 6.5–8.1)

## 2021-09-07 LAB — PHOSPHORUS: Phosphorus: 3.7 mg/dL (ref 2.5–4.6)

## 2021-09-07 LAB — GLUCOSE, CAPILLARY
Glucose-Capillary: 128 mg/dL — ABNORMAL HIGH (ref 70–99)
Glucose-Capillary: 161 mg/dL — ABNORMAL HIGH (ref 70–99)
Glucose-Capillary: 151 mg/dL — ABNORMAL HIGH (ref 70–99)
Glucose-Capillary: 118 mg/dL — ABNORMAL HIGH (ref 70–99)
Glucose-Capillary: 120 mg/dL — ABNORMAL HIGH (ref 70–99)
Glucose-Capillary: 161 mg/dL — ABNORMAL HIGH (ref 70–99)

## 2021-09-07 LAB — MAGNESIUM: Magnesium: 1.6 mg/dL — ABNORMAL LOW (ref 1.7–2.4)

## 2021-09-07 MED ORDER — DICLOFENAC SODIUM 1 % EX GEL
2.0000 g | Freq: Four times a day (QID) | CUTANEOUS | Status: DC
  Administered 2021-09-07 – 2021-11-26 (×306): 2 g via TOPICAL
  Filled 2021-09-07 (×5): qty 100

## 2021-09-07 MED ORDER — FREE WATER
100.0000 mL | Status: DC
Start: 2021-09-07 — End: 2021-11-26
  Administered 2021-09-07 – 2021-11-26 (×479): 100 mL

## 2021-09-07 MED ORDER — MAGNESIUM SULFATE 2 GM/50ML IV SOLN
2.0000 g | Freq: Once | INTRAVENOUS | Status: AC
Start: 2021-09-07 — End: 2021-09-07
  Administered 2021-09-07: 2 g via INTRAVENOUS
  Filled 2021-09-07: qty 50

## 2021-09-07 MED ORDER — POTASSIUM CHLORIDE 20 MEQ PO PACK
40.0000 meq | PACK | Freq: Once | ORAL | Status: AC
  Administered 2021-09-07: 40 meq
  Filled 2021-09-07: qty 2

## 2021-09-07 NOTE — Progress Notes (Signed)
NAME:  Joshua Donovan, MRN:  [id number], DOB:  November 23, 1955, LOS: 86 ADMISSION DATE:  08/03/2021, CONSULTATION DATE:  7/26 REFERRING MD:  Colvin Caroli FOR CONSULT:  CVA   History of Present Illness:  66 y/o male presented to [facility] on 7/24 with R hand numbness and weakness, found to have small brainstem stroke in ventral medulla.  Symptoms worsened and required transfer to [facility] for neuro IR intervention where he had a stent placed in R vertebrovasilar junction.  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  He had a tracheostomy performed on 7/26 c/b dislodgement and ENT revision on 8/2.  Pertinent  Medical History  TBI with ICH, DM, HTN, HLD  Significant Hospital Events: Including procedures, antibiotic start and stop dates in addition to other pertinent events    7/24 presented to [facility], ventral medulla CVA 7/25 tx to [facility] 7/26 cerebral angiogram with stent placement to right vertebro-basilar junction, failed extubation, left radial aline MRI brain> mild extension of stroke, now involving bilateral medial medullary, patent basilar artery and R VBJ stent  7/28 underwent bedside percutaneous tracheostomy and PEG tube placement, tolerated well 7/29 no acute issues overnight currently on SBT trial this a.m. and tolerating well, Cleviprex resumed earlier this a.m. due to severe hypertension 7/30 Hypertension persist despite adding oral agents, glucose remains elevated as well 7/31 Bleeding around trach site, bright red blood. Copious bloody secretions. Cleviprex off, SBPs 150s-160s. Basal insulin/TF coverage increased. Cefepime deescalated to ceftriaxone. CXR stable. Later in afternoon, continued peritrach bleeding. Bronchoscopic eval completed with intratracheal bleeding associated with trach. Removed, patient orally reintubated, stoma packed. 8/1 ENT consulted for trach revision. Lightly sedated. Vent full support/PRVC, given fatigue following events of yesterday. ENT attempted to  evaluate trach at bedside, could not pass scope. Plan for OR 8/2. ASA/Brilinta held. 8/2 to OR for trach redo 8/6 trach collar during day shift, back on vent overnight 8/10 Hemoptysis, blood with suctioning > improved 8/11 8/14 on 8L / 35% ATC  8/28: Stable vitals, increased tenacious secretions from trach. CXR suggesting new LLL basilar opacities atelectasis vs pna 09/07/2021 Decreased volume and thickness of secretions. No fevers. Tracheal aspirate from 8/28 with mod gram positive cocci and few gram negative rods today, culture pending.  Interim History / Subjective:   Patient appears comfortable in bed, lying with eyes open. Able to understand and communicates by mouthing words, blinking to respond to YES/NO questions.   Slept better with less cough. Denies chills, sweats, chest pain, difficulty breathing. No pain from tracheostomy site.   Per nursing, patient with decreased and looser secretions compared to the past two days. They have also repositioned patient to be more upright to facilitate breathing and coughing efforts.  Objective   Blood pressure (!) 126/1, pulse 80, temperature 98.9 F (37.2 C), temperature source Oral, resp. rate (!) 28, height '5\' 7"'$  (1.702 m), weight 80.5 kg, SpO2 94 %.    FiO2 (%):  [28 %-35 %] 28 %   Intake/Output Summary (Last 24 hours) at 09/07/2021 1123 Last data filed at 09/07/2021 0420 Gross per 24 hour  Intake 900 ml  Output 1200 ml  Net -300 ml   Filed Weights   09/05/21 0500 09/06/21 0500 09/07/21 0500  Weight: 80.5 kg 80.5 kg 80.5 kg    Examination: Chronically ill-appearing man comfortably lying in bed HEENT: Tracheostomy in place, no purulence, erythema, skin breakdown noted on exam. Bandage in place for protection.  Less secretions in suction receptacle.  No yellow  or green sputum noted. Heart: Regular heart and rhythm.  Lungs: Not increased work of breathing today. Mild ronchi noted anteriorly. Abdomen: Soft nontender  nondistended Neuro: Alert, communicating with full EOM.  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 No fevers or worsening oxygen requirement today.  Tracheal aspirate from 8/28 with mod gram positive cocci and few gram negative rods today, culture pending. Will continue to follow results but not inclined to add antibiotics at this time.  Plan: -Continue routine trach care -Appreciate help with RT, speech. -Continue 3%hypertonic saline nebs BID -Continue Guaifenesin q4HR -Follow respiratory cultures -Consider antibiotic therapy if clinical decline: fever, increased oxygen requirement, leukocytosis.   Romana Juniper, MD 09/07/2021 11:23 AM

## 2021-09-07 NOTE — Progress Notes (Addendum)
PROGRESS NOTE    Joshua Donovan  UKG:[id number] DOB: Mar 07, 1955 DOA: 08/03/2021 PCP: [facility] [place]  No chief complaint on file.   Brief Narrative:  Patient with history of ICH, type II DM, HTN, HLD presented to [facility] on 7/24 with right hand numbness and weakness, initially found to have Joshua Donovan small brainstem stroke and ventral medulla.  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  He was intubated and transferred to [facility] for interventional radiology intervention.  Remained in the intensive care unit.  Now with tracheostomy and PEG tube placement.  Pending LTAC transfer.  7/24 presented to [facility], ventral medulla CVA 7/25 tx to [facility], treated with Cleviprex. 7/26 cerebral angiogram with stent placement to right vertebro-basilar junction, failed extubation, MRI brain> mild extension of stroke, now involving bilateral medial medullary, patent basilar artery and R VBJ stent  7/28 bedside tracheostomy by PCCM and PEG tube placement by general surgery 7/30 Hypertension persist despite adding oral agents, glucose remains elevated as well 7/31 Bleeding around trach site, bright red blood.  Underwent bronchoscopy, Trach Removed, patient orally reintubated, stoma packed. 8/2 ENT took patient to OR for trach redo 8/9 transferred to medical floor on tracheostomy now. 8/12 vomiting after bolus feeding with worsening hypoxia. 8/13 copious bleeding from the heparin injection site controlled with pressure dressing. 8/14 bleeding has resolved.  Trach secretions blood-tinged. 8/15 insurance denied LTAC appeal.  CSW working on SNF. 8/16>8/22: 25 beat run of NSVT on 8/21 8/28: peer to peer for LTAC, no call back yet    Assessment & Plan:   Principal Problem:   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[place]) Active Problems:   Acute respiratory failure with hypoxia ([place])   Hypertension   Uncontrolled type 2 diabetes mellitus with  hyperglycemia ([place])   Mixed hyperlipidemia   Alcohol abuse   NSVT (nonsustained ventricular tachycardia) ([place])   Dysphagia   Tracheostomy dependent ([place])  8/28, called for peer to peer and left my number.  Called back x2 to ensure they had correct number.  Told on 8/28 that they may not call back if he was approved for ltach or they may call 8/29.  I've received no call today 8/29.  Assessment and Plan: * Acute stroke of medulla oblongata [facility]) Neurology consulted. Patient initially loaded with aspirin and Brilinta. Cerebral angiogram performed on 7/26 with placement of Joshua Donovan stent of right vertebro-basilar junction. Continue aspirin and Brilinta With dense quadriplegia, unable to move extremities Last note from neurology 8/4  Acute respiratory failure with hypoxia [facility]) Patient required ICU admission and mechanical ventilation after admission secondary to worsening mentation. Concern for possible aspiration pneumonitis. Patient unable to extubate. Tracheostomy placed on 7/31 and revised by ENT on 8/2 secondary to bleeding. Currently stable. -PCCM for trach management -Guaifenesin q4 hours to help with secretions -hypertonic saline neb BID for mucociliary clearance -follow resp culture, CXR today with new L basilar opacities, atelectasis or pneumonia.  Appreciate pulm, recommending hypertonic saline, guaifenesin.  Follow resp culture (resp flora), no current indication for abx.  Alcohol abuse Initially managed on CIWA. Continue thiamine, multivitamin and folic acid  Mixed hyperlipidemia -Continue Lipitor  Uncontrolled type 2 diabetes mellitus with hyperglycemia (HCC) Most recent hemoglobin A1C of 8.3%. uncontrolled with hyper- and hypoglycemia. -reduce dose of Levemir to 55 units daily and Novolog 10 units every 4 hours  Hypertension Previously with hypertensive emergency which has now resolved. -Continue clonidine, amlodipine, hydrochlorothiazide and Coreg  NSVT (nonsustained  ventricular tachycardia) (  [place]) Patient with intermittent Vtach. Most recently with 25 beats of vtach on 8/21. QTc of 424 msec -Replete potassium and magnesium -Cardiology consult, maintain mag >2 and k >4, no additioonal cardiac w/u   Dysphagia Patient is s/p PEG tube and is on tube feeds via PEG. -Continue tube feeds and free water  Tracheostomy complication (HCC)-resolved as of 08/25/2021 In setting of DAPT. ENT consulted for revision. Bleeding resolved.      DVT prophylaxis: SCD Code Status: full Family Communication: friend at bedside Disposition:   Status is: Inpatient Remains inpatient appropriate because: no safe d/c plan   Consultants:  PCCM Neurology ENT  Procedures:  echo  Antimicrobials:  Anti-infectives (From admission, onward)    Start     Dose/Rate Route Frequency Ordered Stop   08/11/21 1400  ceFEPIme (MAXIPIME) 2 g in sodium chloride 0.9 % 100 mL IVPB        2 g 200 mL/hr over 30 Minutes Intravenous Every 8 hours 08/11/21 0957 08/16/21 0633   08/09/21 1300  cefTRIAXone (ROCEPHIN) 1 g in sodium chloride 0.9 % 100 mL IVPB  Status:  Discontinued        1 g 200 mL/hr over 30 Minutes Intravenous Every 24 hours 08/09/21 1151 08/11/21 0957   08/08/21 1200  ceFEPIme (MAXIPIME) 2 g in sodium chloride 0.9 % 100 mL IVPB  Status:  Discontinued        2 g 200 mL/hr over 30 Minutes Intravenous Every 8 hours 08/08/21 1104 08/09/21 1151   08/06/21 1500  cefTRIAXone (ROCEPHIN) 2 g in sodium chloride 0.9 % 100 mL IVPB  Status:  Discontinued        2 g 200 mL/hr over 30 Minutes Intravenous Every 24 hours 08/06/21 1421 08/08/21 1104   08/04/21 1006  ceFAZolin (ANCEF) 2-4 GM/100ML-% IVPB       Note to Pharmacy: Joshua Donovan E: cabinet override      08/04/21 1006 08/04/21 2214      Subjective: No c/o pain, discomfort  Objective: Vitals:   09/07/21 1115 09/07/21 1200 09/07/21 1527 09/07/21 1545  BP: 123/80  119/79   Pulse: 80 83  84  Resp: (!) 28 (!) 32  (!) 28   Temp: 98.5 F (36.9 C)  98.3 F (36.8 C)   TempSrc: Oral  Oral   SpO2:  95%  96%  Weight:      Height:        Intake/Output Summary (Last 24 hours) at 09/07/2021 1824 Last data filed at 09/07/2021 1700 Gross per 24 hour  Intake 650 ml  Output 2000 ml  Net -1350 ml   Filed Weights   09/05/21 0500 09/06/21 0500 09/07/21 0500  Weight: 80.5 kg 80.5 kg 80.5 kg    Examination:  General: No acute distress. Cardiovascular: RRR Lungs: unlabored Abdomen: Soft, nontender, nondistended Neurological: communicating with eyes, mouthing wsords, dense quadriplegia Extremities: No clubbing or cyanosis. No edema.   Data Reviewed: I have personally reviewed following labs and imaging studies  CBC: Recent Labs  Lab 09/02/21 0224 09/03/21 0233 09/04/21 0209 09/06/21 0247 09/07/21 0155  WBC 9.1 8.5 9.4 8.3 8.3  NEUTROABS 6.4 5.9 7.0 5.7 6.1  HGB 12.1* 12.8* 12.3* 12.7* 12.3*  HCT 37.0* 38.3* 38.7* 38.1* 37.9*  MCV 86.9 86.1 87.2 85.0 87.1  PLT 325 324 291 278 953    Basic Metabolic Panel: Recent Labs  Lab 09/02/21 0224 09/03/21 0233 09/04/21 0209 09/06/21 0247 09/07/21 0155  NA 137 141 138 137 133*  K  3.5 3.7 4.0 3.6 3.3*  CL 100 102 102 99 97*  CO2 '28 29 27 28 26  '$ GLUCOSE 164* 92 142* 115* 157*  BUN '17 20 19 17 15  '$ CREATININE 0.71 0.71 0.68 0.63 0.57*  CALCIUM 8.8* 9.4 9.1 8.8* 8.7*  MG 1.7 2.0 1.7 1.7 1.6*  PHOS 4.7* 4.8* 4.7* 4.2 3.7    GFR: Estimated Creatinine Clearance: 92.4 mL/min (Joshua Donovan) (by C-G formula based on SCr of 0.57 mg/dL (L)).  Liver Function Tests: Recent Labs  Lab 09/02/21 0224 09/03/21 0233 09/04/21 0209 09/06/21 0247 09/07/21 0155  AST 33 48* 38 27 31  ALT 60* 77* 75* 61* 64*  ALKPHOS 39 39 42 39 39  BILITOT 0.3 0.4 0.2* 0.4 0.3  PROT 6.3* 6.7 6.5 6.5 6.2*  ALBUMIN 2.4* 2.6* 2.5* 2.5* 2.5*    CBG: Recent Labs  Lab 09/06/21 2328 09/07/21 0416 09/07/21 0749 09/07/21 1118 09/07/21 1524  GLUCAP 171* 128* 161* 151* 118*      Recent Results (from the past 240 hour(s))  Culture, Respiratory w Gram Stain     Status: None (Preliminary result)   Collection Time: 09/06/21  7:21 PM   Specimen: Tracheal Aspirate; Respiratory  Result Value Ref Range Status   Specimen Description TRACHEAL ASPIRATE  Final   Special Requests NONE  Final   Gram Stain   Final    FEW WBC PRESENT, PREDOMINANTLY PMN MODERATE GRAM POSITIVE COCCI FEW GRAM NEGATIVE RODS RARE GRAM POSITIVE RODS    Culture   Final    MODERATE Normal respiratory flora-no Staph aureus or Pseudomonas seen Performed at [facility], [address], [place], [place] [address]    Report Status PENDING  Incomplete         Radiology Studies: DG CHEST PORT 1 VIEW  Result Date: 09/06/2021 CLINICAL DATA:  Bronchitis EXAM: PORTABLE CHEST 1 VIEW COMPARISON:  Radiograph 08/22/2021 FINDINGS: Tracheostomy tube overlies the trachea. Unchanged cardiomediastinal silhouette. New left basilar opacities. No large effusion. No pneumothorax. Bones are unchanged. IMPRESSION: New left basilar opacities, which could be atelectasis or pneumonia. Electronically Signed   By: Maurine Simmering M.D.   On: 09/06/2021 16:49        Scheduled Meds:  amLODipine  10 mg Per Tube Daily   aspirin  81 mg Per Tube Daily   atorvastatin  80 mg Per Tube Daily   carvedilol  25 mg Per Tube BID WC   cloNIDine  0.1 mg Per Tube TID   diclofenac Sodium  2 g Topical QID   feeding supplement (PROSource TF20)  60 mL Per Tube Daily   fiber supplement (BANATROL TF)  60 mL Per Tube BID   folic acid  1 mg Per Tube Daily   free water  100 mL Per Tube Q4H   guaiFENesin  10 mL Per Tube Q4H   hydrochlorothiazide  12.5 mg Per Tube Daily   insulin aspart  10 Units Subcutaneous Q4H   insulin detemir  55 Units Subcutaneous Daily   liver oil-zinc oxide   Topical TID   multivitamin with minerals  1 tablet Per Tube Daily   mouth rinse  15 mL Mouth Rinse Q2H   potassium chloride  40 mEq Per Tube Daily    scopolamine  1 patch Transdermal Q72H   sodium chloride HYPERTONIC  4 mL Nebulization BID   thiamine  100 mg Per Tube Daily   ticagrelor  90 mg Per Tube BID   Continuous Infusions:  feeding supplement (JEVITY  1.5 CAL/FIBER) 1,000 mL (09/07/21 0306)     LOS: 35 days    Time spent: over 30 min    Fayrene Helper, MD [facility]   To contact the attending provider between 7A-7P or the covering provider during after hours 7P-7A, please log into the web site [email or web address] and access using universal [place] password for that web site. If you do not have the password, please call the hospital operator.  09/07/2021, 6:24 PM

## 2021-09-08 LAB — BASIC METABOLIC PANEL
Anion gap: 11 (ref 5–15)
BUN: 16 mg/dL (ref 8–23)
CO2: 26 mmol/L (ref 22–32)
Calcium: 9.4 mg/dL (ref 8.9–10.3)
Chloride: 100 mmol/L (ref 98–111)
Creatinine, Ser: 0.65 mg/dL (ref 0.61–1.24)
GFR, Estimated: >60 mL/min (ref >60)
Glucose, Bld: 177 mg/dL — ABNORMAL HIGH (ref 70–99)
Potassium: 4.4 mmol/L (ref 3.5–5.1)
Sodium: 137 mmol/L (ref 135–145)

## 2021-09-08 LAB — GLUCOSE, CAPILLARY
Glucose-Capillary: 97 mg/dL (ref 70–99)
Glucose-Capillary: 222 mg/dL — ABNORMAL HIGH (ref 70–99)
Glucose-Capillary: 158 mg/dL — ABNORMAL HIGH (ref 70–99)
Glucose-Capillary: 158 mg/dL — ABNORMAL HIGH (ref 70–99)
Glucose-Capillary: 139 mg/dL — ABNORMAL HIGH (ref 70–99)
Glucose-Capillary: 146 mg/dL — ABNORMAL HIGH (ref 70–99)

## 2021-09-08 LAB — MAGNESIUM: Magnesium: 1.8 mg/dL (ref 1.7–2.4)

## 2021-09-08 MED ORDER — CARVEDILOL 12.5 MG PO TABS
37.5000 mg | ORAL_TABLET | Freq: Two times a day (BID) | ORAL | Status: DC
  Administered 2021-09-08 – 2021-11-26 (×158): 37.5 mg
  Filled 2021-09-08 (×160): qty 1

## 2021-09-08 NOTE — Progress Notes (Signed)
Nutrition Follow-up  DOCUMENTATION CODES:   Not applicable  INTERVENTION:  Tube Feeding via PEG:   Jevity 1.5 @ 60 ml/hr 60 ml Pro-Source TF 20 daily   Provides: 2240 kcal, 110 grams protein, and 1080 ml free water.    100 ml free water every 4 hours Total free water: 1680 ml    MVI with minerals daily   Banatrol BID  NUTRITION DIAGNOSIS:   Inadequate oral intake related to acute illness as evidenced by NPO status.  Ongoing  GOAL:   Patient will meet greater than or equal to 90% of their needs  Goal met via TF  MONITOR:   Vent status, Labs, Weight trends, TF tolerance  REASON FOR ASSESSMENT:   Consult Enteral/tube feeding initiation and management  ASSESSMENT:   66 yo male admitted post brain stem stroke involving ventral medulla requiring stent placement to right vertebro-basilar junction. PMH includes HTN, TBI, DM, HLD  Pt remains on trach collar with 5L O Increased secretions from trach. CXR suggesting new LLL basilar opacities atelectasis vs PNA. Pt with medial medullary syndrome with quadriplegia/quadriparesis  Peer to peer denied today for LTAC.   Free water flushes decreased to 158m q4h on 8/29  [facility] Cerebral angiogram with stent placement to right vertebro-basilar junction, failed extubation, reintubated 7/28 s/p PEG placement 8/2 s/p trach revision with ENT in the OR 8/9 tx to floor   Weight remains stable.   Medications: folvite, SSI 10 units q4h, levemir 55 units daily, MVI, klor-con, thiamine  Labs: sodium 133, potassium 3.3, Cr 0.57, Mg 1.6, ALT 64, CBG's 97-222 x24 hours  UOP: 2.1L x24 hours + [id number] hours I/O's: +[id number] admission  Diet Order:   Diet Order             Diet NPO time specified  Diet effective midnight                   EDUCATION NEEDS:   Not appropriate for education at this time  Skin:  Skin Assessment: Skin Integrity Issues: Skin  Integrity Issues:: Other (Comment) Other: non-pressure wound bilateral buttock  Last BM:  8/28  Height:   Ht Readings from Last 1 Encounters:  08/04/21 5' 7"  (1.702 m)    Weight:   Wt Readings from Last 1 Encounters:  09/08/21 80.5 kg   BMI:  Body mass index is 27.8 kg/m.  Estimated Nutritional Needs:   Kcal:  2000-2200 kcals  Protein:  100-115 g  Fluid:  >/= 2 L  AllClayborne DanaDN, LDN Clinical Nutrition

## 2021-09-08 NOTE — Progress Notes (Signed)
PROGRESS NOTE    Joshua Donovan  EGB:[id number] DOB: 1955/09/06 DOA: 08/03/2021 PCP: [facility] [place]  No chief complaint on file.   Brief Narrative:  Patient with history of ICH, type II DM, HTN, HLD presented to [facility] on 7/24 with right hand numbness and weakness, initially found to have a small brainstem stroke and ventral medulla.  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  He was intubated and transferred to [facility] for interventional radiology intervention.  Remained in the intensive care unit.  Now with tracheostomy and PEG tube placement.  Pending LTAC transfer.  7/24 presented to [facility], ventral medulla CVA 7/25 tx to [facility], treated with Cleviprex. 7/26 cerebral angiogram with stent placement to right vertebro-basilar junction, failed extubation, MRI brain> mild extension of stroke, now involving bilateral medial medullary, patent basilar artery and R VBJ stent  7/28 bedside tracheostomy by PCCM and PEG tube placement by general surgery 7/30 Hypertension persist despite adding oral agents, glucose remains elevated as well 7/31 Bleeding around trach site, bright red blood.  Underwent bronchoscopy, Trach Removed, patient orally reintubated, stoma packed. 8/2 ENT took patient to OR for trach redo 8/9 transferred to medical floor on tracheostomy now. 8/12 vomiting after bolus feeding with worsening hypoxia. 8/13 copious bleeding from the heparin injection site controlled with pressure dressing. 8/14 bleeding has resolved.  Trach secretions blood-tinged. 8/15 insurance denied LTAC appeal.  CSW working on SNF. 8/16>8/22: 25 beat run of NSVT on 8/21 8/28: peer to peer for LTAC looks like this was denied- Interval x-ray showed basilar opacities--respiratory culture ordered, hypertonic saline started scopolamine discontinued 8/29: V. tach runs    Assessment & Plan:   Principal Problem:   Acute stroke of medulla  oblongata ([place]) Active Problems:   Acute respiratory failure with hypoxia ([place])   Hypertension   Uncontrolled type 2 diabetes mellitus with hyperglycemia (HCC)   Mixed hyperlipidemia   Alcohol abuse   NSVT (nonsustained ventricular tachycardia) ([place])   Dysphagia   Tracheostomy dependent ([place])  SUBJECTIVE Attempts to mouth words but is unsuccessful although I cannot understand him if the room is very quiet-he asks me "when will I speak again"-I explained to him I am not sure He has absolutely no ability to move his extremities and is pretty locked in.  I received word from his prior physician that he got a call about the peer to peer being denied-social work is trying to investigate the next steps  Assessment and Plan: * 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[place]) initially loaded with aspirin and Brilinta.  Cerebral angiogram performed on 7/26 with placement of a stent of right vertebro-basilar junction. Continue aspirin and Brilinta With dense quadriplegia, unable to move extremities Last note from neurology 8/4  Acute respiratory failure with hypoxia ([place]) required ICU admission and mechanical ventilation after admission secondary to worsening mentation. Concern for possible aspiration pneumonitis. Patient unable to extubate. Tracheostomy placed on 7/31 and revised by ENT on 8/2 secondary to bleeding. Currently stable. -PCCM for trach management -Guaifenesin q4 hours to help with secretions -hypertonic saline neb BID for mucociliary clearance -Respiratory cultures showed staph, likely colonization (no fever curve no leukocytosis)  Alcohol abuse Initially managed on CIWA--now completely resolved Continue thiamine, multivitamin and folic acid  Mixed hyperlipidemia -Continue Lipitor  Uncontrolled type 2 diabetes mellitus with hyperglycemia/hypoglycemia (HCC)A1C of 8.3%.= -Continue Levemir to 55 units daily and Novolog 10 units every 4 hours -Aim for blood sugars below  250  Hypertension Previously  with hypertensive emergency which has now resolved. -Continue amlodipine 10, hydrochlorothiazide 12.5 and Cor 37.5 twice daily eg  NSVT (nonsustained ventricular tachycardia) (HCC) Patient with intermittent Vtach. Most recently with 25 beats of vtach on 8/29. QTc of 424 msec -Replete potassium and magnesium -Cardiology saw patient previously 8/2 recommended-- maintain mag >2 and k >4, no additional cardiac w/u   Dysphagia Patient is s/p PEG tube and is on tube feeds via PEG. -Continue tube feeds and free water 100 cc  Tracheostomy complication (HCC)-resolved as of 08/25/2021 In setting of DAPT. ENT consulted for revision. Bleeding resolved.      DVT prophylaxis: SCD Code Status: full Family Communication: friend at bedside Disposition:   Status is: Inpatient Remains inpatient appropriate because: no safe d/c plan   Consultants:  PCCM Neurology ENT  Procedures:  echo  Antimicrobials:  Anti-infectives (From admission, onward)    Start     Dose/Rate Route Frequency Ordered Stop   08/11/21 1400  ceFEPIme (MAXIPIME) 2 g in sodium chloride 0.9 % 100 mL IVPB        2 g 200 mL/hr over 30 Minutes Intravenous Every 8 hours 08/11/21 0957 08/16/21 0633   08/09/21 1300  cefTRIAXone (ROCEPHIN) 1 g in sodium chloride 0.9 % 100 mL IVPB  Status:  Discontinued        1 g 200 mL/hr over 30 Minutes Intravenous Every 24 hours 08/09/21 1151 08/11/21 0957   08/08/21 1200  ceFEPIme (MAXIPIME) 2 g in sodium chloride 0.9 % 100 mL IVPB  Status:  Discontinued        2 g 200 mL/hr over 30 Minutes Intravenous Every 8 hours 08/08/21 1104 08/09/21 1151   08/06/21 1500  cefTRIAXone (ROCEPHIN) 2 g in sodium chloride 0.9 % 100 mL IVPB  Status:  Discontinued        2 g 200 mL/hr over 30 Minutes Intravenous Every 24 hours 08/06/21 1421 08/08/21 1104   08/04/21 1006  ceFAZolin (ANCEF) 2-4 GM/100ML-% IVPB       Note to Pharmacy: Margaretmary Dys E: cabinet override       08/04/21 1006 08/04/21 2214      Subjective: No c/o pain, discomfort  Objective: Vitals:   09/08/21 0758 09/08/21 0810 09/08/21 1129 09/08/21 1139  BP: (!) 147/79   (!) 140/83  Pulse: 85 87 82 84  Resp: (!) 32 (!) 28 (!) 26 (!) 36  Temp: 98.4 F (36.9 C)   98.5 F (36.9 C)  TempSrc: Oral   Oral  SpO2: 99% 99% 96% 97%  Weight:      Height:        Intake/Output Summary (Last 24 hours) at 09/08/2021 1546 Last data filed at 09/08/2021 1000 Gross per 24 hour  Intake 3250 ml  Output 2000 ml  Net 1250 ml    Filed Weights   09/06/21 0500 09/07/21 0500 09/08/21 0500  Weight: 80.5 kg 80.5 kg 80.5 kg    Examination:  Tends to be able to mouth words but cannot use the device at the bedside Seems a little frustrated Overall otherwise fair Chest anterolaterally is clear Abdomen is soft with PEG tube in place Absolutely no polyp  Response and mild severity  Data Reviewed: I have personally reviewed following labs and imaging studies  CBC: Recent Labs  Lab 09/02/21 0224 09/03/21 0233 09/04/21 0209 09/06/21 0247 09/07/21 0155  WBC 9.1 8.5 9.4 8.3 8.3  NEUTROABS 6.4 5.9 7.0 5.7 6.1  HGB 12.1* 12.8* 12.3* 12.7* 12.3*  HCT 37.0*  38.3* 38.7* 38.1* 37.9*  MCV 86.9 86.1 87.2 85.0 87.1  PLT 325 324 291 278 263     Basic Metabolic Panel: Recent Labs  Lab 09/02/21 0224 09/03/21 0233 09/04/21 0209 09/06/21 0247 09/07/21 0155 09/08/21 1259  NA 137 141 138 137 133* 137  K 3.5 3.7 4.0 3.6 3.3* 4.4  CL 100 102 102 99 97* 100  CO2 '28 29 27 28 26 26  '$ GLUCOSE 164* 92 142* 115* 157* 177*  BUN '17 20 19 17 15 16  '$ CREATININE 0.71 0.71 0.68 0.63 0.57* 0.65  CALCIUM 8.8* 9.4 9.1 8.8* 8.7* 9.4  MG 1.7 2.0 1.7 1.7 1.6* 1.8  PHOS 4.7* 4.8* 4.7* 4.2 3.7  --      GFR: Estimated Creatinine Clearance: 92.4 mL/min (by C-G formula based on SCr of 0.65 mg/dL).  Liver Function Tests: Recent Labs  Lab 09/02/21 0224 09/03/21 0233 09/04/21 0209 09/06/21 0247 09/07/21 0155   AST 33 48* 38 27 31  ALT 60* 77* 75* 61* 64*  ALKPHOS 39 39 42 39 39  BILITOT 0.3 0.4 0.2* 0.4 0.3  PROT 6.3* 6.7 6.5 6.5 6.2*  ALBUMIN 2.4* 2.6* 2.5* 2.5* 2.5*     CBG: Recent Labs  Lab 09/07/21 1926 09/07/21 2316 09/08/21 0325 09/08/21 0757 09/08/21 1137  GLUCAP 120* 161* 97 222* 158*      Recent Results (from the past 240 hour(s))  Culture, Respiratory w Gram Stain     Status: None (Preliminary result)   Collection Time: 09/06/21  7:21 PM   Specimen: Tracheal Aspirate; Respiratory  Result Value Ref Range Status   Specimen Description TRACHEAL ASPIRATE  Final   Special Requests NONE  Final   Gram Stain   Final    FEW WBC PRESENT, PREDOMINANTLY PMN MODERATE GRAM POSITIVE COCCI FEW GRAM NEGATIVE RODS RARE GRAM POSITIVE RODS    Culture   Final    MODERATE STAPHYLOCOCCUS AUREUS CULTURE REINCUBATED FOR BETTER GROWTH Performed at [facility], [place] [address] [place], [place] [address]    Report Status PENDING  Incomplete         Radiology Studies: DG CHEST PORT 1 VIEW  Result Date: 09/06/2021 CLINICAL DATA:  Bronchitis EXAM: PORTABLE CHEST 1 VIEW COMPARISON:  Radiograph 08/22/2021 FINDINGS: Tracheostomy tube overlies the trachea. Unchanged cardiomediastinal silhouette. New left basilar opacities. No large effusion. No pneumothorax. Bones are unchanged. IMPRESSION: New left basilar opacities, which could be atelectasis or pneumonia. Electronically Signed   By: Maurine Simmering M.D.   On: 09/06/2021 16:49        Scheduled Meds:  amLODipine  10 mg Per Tube Daily   aspirin  81 mg Per Tube Daily   atorvastatin  80 mg Per Tube Daily   carvedilol  37.5 mg Per Tube BID WC   diclofenac Sodium  2 g Topical QID   feeding supplement (PROSource TF20)  60 mL Per Tube Daily   fiber supplement (BANATROL TF)  60 mL Per Tube BID   folic acid  1 mg Per Tube Daily   free water  100 mL Per Tube Q4H   guaiFENesin  10 mL Per Tube Q4H   hydrochlorothiazide  12.5 mg Per Tube  Daily   insulin aspart  10 Units Subcutaneous Q4H   insulin detemir  55 Units Subcutaneous Daily   liver oil-zinc oxide   Topical TID   multivitamin with minerals  1 tablet Per Tube Daily   mouth rinse  15 mL Mouth Rinse Q2H  potassium chloride  40 mEq Per Tube Daily   scopolamine  1 patch Transdermal Q72H   sodium chloride HYPERTONIC  4 mL Nebulization BID   thiamine  100 mg Per Tube Daily   ticagrelor  90 mg Per Tube BID   Continuous Infusions:  feeding supplement ([name]) 1,000 mL (09/08/21 0252)     LOS: 36 days    Time spent: [place], MD [facility]   To contact the attending provider between 7A-7P or the covering provider during after hours 7P-7A, please log into the web site [email or web address] and access using universal [place] password for that web site. If you do not have the password, please call the hospital operator.  09/08/2021, 3:46 PM

## 2021-09-08 NOTE — Progress Notes (Signed)
Occupational Therapy Treatment Patient Details Name: Joshua Donovan MRN: [id number] DOB: 22-Jan-1955 Today's Date: 09/08/2021   History of present illness Pt is a 66 y.o. male who presented 08/02/21 with R foot pain and R-sided weakness. Transferred to [facility] 7/25. 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 S/p stent placement to the R vertebrobasilar junction 7/26, failed extubation. Trach and PEG placed 7/28. S/p bronchoscopic evaluation, oral reintubation and tracheostomy removal with stoma packing 7/31 due to trach site bleeding. S/p trach revision 8/2. PMH: DM, GERD, TBI with prior ICH, HLD, HTN   OT comments  Pt seen with PT for safe EOB and OOB progression. He remains total A for care and mobility but notable improvements in mouthing for communication and head control. PROM exercises completed at supine and in sitting for joint integrity. Pt left setting in tilt in space with head supported. OT to continue to follow. POC remains appropriate.    Recommendations for follow up therapy are one component of a multi-disciplinary discharge planning process, led by the attending physician.  Recommendations may be updated based on patient status, additional functional criteria and insurance authorization.    Follow Up Recommendations  OT at Long-term acute care hospital    Assistance Recommended at Discharge Frequent or constant Supervision/Assistance  Patient can return home with the following  Two people to help with walking and/or transfers;Two people to help with bathing/dressing/bathroom;Assistance with cooking/housework;Assistance  with feeding;Direct supervision/assist for medications management;Direct supervision/assist for financial management;Assist for transportation;Help with stairs or ramp for entrance   Equipment Recommendations  Other (comment)    Recommendations for Other Services      Precautions / Restrictions Precautions Precautions: Fall Precaution Comments: trach collar, peg, SBP < 180, prevalon boots, copious trach secretions, watch HR Restrictions Weight Bearing Restrictions: No Other Position/Activity Restrictions: splint on LUE to manage lines       Mobility Bed Mobility Overal bed mobility: Needs Assistance Bed Mobility: Rolling   Sidelying to sit: Total assist, +2 for physical assistance Supine to sit: Total assist, +2 for physical assistance, +2 for safety/equipment, HOB elevated Sit to supine: Total assist, +2 for physical assistance   General bed mobility comments: total A to transfer to sitting EOB for exercises    Transfers Overall transfer level: Needs assistance Equipment used: Ambulation equipment used Transfers: Bed to chair/wheelchair/BSC               Transfer via Lift Equipment: Maximove   Balance Overall balance assessment: Needs assistance Sitting-balance support: No upper extremity supported Sitting balance-Leahy Scale: Poor Sitting balance - Comments: minA-total A for static sitting balance                                   ADL either performed or assessed with clinical judgement   ADL Overall ADL's : Needs assistance/impaired                                       General ADL Comments: total A for all aspects    Extremity/Trunk  Assessment Upper Extremity Assessment Upper Extremity Assessment: Defer to OT evaluation RUE Deficits / Details: No AROM noted even for shoulder shrug.  PROM WNL intact. RUE Coordination: decreased gross motor;decreased fine motor LUE Deficits / Details: No AROM noted even for shoulder shrug.   PROM WNL WFL LUE Coordination: decreased fine motor;decreased gross motor   Lower Extremity Assessment Lower Extremity Assessment: Defer to PT evaluation        Vision   Vision Assessment?: Yes Additional Comments: vertical nystagmus   Perception Perception Perception: Not tested   Praxis Praxis Praxis: Not tested    Cognition Arousal/Alertness: Awake/alert Behavior During Therapy: WFL for tasks assessed/performed Overall Cognitive Status: Difficult to assess Area of Impairment: Memory, Following commands                 Orientation Level: Person, Place, Time   Memory: Decreased short-term memory Following Commands: Follows one step commands consistently       General Comments: awake and mouths single word responses appropriately, but when attempting more, hard to understand        Exercises Exercises: General Upper Extremity, Other exercises General Exercises - Upper Extremity Elbow Flexion: PROM, Both, 10 reps, Supine Elbow Extension: PROM, Both, 10 reps, Supine Wrist Flexion: PROM, Both, 10 reps, Supine Wrist Extension: PROM, Both, 10 reps, Supine Digit Composite Flexion: PROM, Both, 10 reps, Seated Composite Extension: PROM, Both, 10 reps, Seated Other Exercises Other Exercises: PROM cervical rotation L&R 10 second holds Other Exercises: L&R trunk rotation PROM stretching 5 second holds Other Exercises: WB through bilar elbow 2x each    Shoulder Instructions       General Comments VSS on 5L at 28% tc    Pertinent Vitals/ Pain       Pain Assessment Pain Assessment: Faces Pain Location: grimacing with LE stretching Pain Descriptors / Indicators: Grimacing Pain Intervention(s): Limited activity within patient's tolerance  Home Living                                          Prior Functioning/Environment              Frequency  Min 2X/week        Progress Toward Goals  OT Goals(current goals can now be found in  the care plan section)  Progress towards OT goals: Progressing toward goals  Acute Rehab OT Goals Patient Stated Goal: none stated OT Goal Formulation: Patient unable to participate in goal setting Time For Goal Achievement: 09/15/21 Potential to Achieve Goals: Fair ADL Goals Pt/caregiver will Perform Home Exercise Program: Both right and left upper extremity;With written HEP provided Additional ADL Goal #1: Pt's family will be educated about bed positioning to avoid pressure sores and increase care of skin Additional ADL Goal #2: Pt will use soft touch call bell to call nurses by moving head with min assist. Additional ADL Goal #3: Therapist to explore other options for call bell use if pt unable to move head to use soft touch system.  Plan Discharge plan remains appropriate    Co-evaluation    PT/OT/SLP Co-Evaluation/Treatment: Yes Reason for Co-Treatment: Complexity of the patient's impairments (multi-system involvement);To address functional/ADL transfers;For patient/therapist safety   OT goals addressed during session: ADL's and self-care      AM-PAC OT "6 Clicks" Daily Activity     Outcome Measure   Help from another person eating meals?: Total Help  from another person taking care of personal grooming?: Total Help from another person toileting, which includes using toliet, bedpan, or urinal?: Total Help from another person bathing (including washing, rinsing, drying)?: Total Help from another person to put on and taking off regular upper body clothing?: Total Help from another person to put on and taking off regular lower body clothing?: Total 6 Click Score: 6    End of Session Equipment Utilized During Treatment: Oxygen  OT Visit Diagnosis: Other symptoms and signs involving the nervous system (R29.898)   Activity Tolerance Patient tolerated treatment well   Patient Left in chair;with call bell/phone within reach;with chair alarm set   Nurse Communication Mobility  status        Time: 1886-7737 OT Time Calculation (min): 56 min  Charges: OT General Charges $OT Visit: 1 Visit OT Treatments $Therapeutic Activity: 23-37 mins    Damondre Pfeifle A Shandria Clinch 09/08/2021, 3:26 PM

## 2021-09-08 NOTE — Progress Notes (Signed)
Noted pt had a runs of V-tach around 0130 per previous nurses noted. Potassium 8/29- 3.3. Dr. Verlon Au notified see new orders for labs and increased coreg.

## 2021-09-08 NOTE — Progress Notes (Signed)
Pt had a runs of V-Tach. Will continue monitoring.

## 2021-09-08 NOTE — Progress Notes (Signed)
Physical Therapy Treatment Patient Details Name: Joshua Donovan MRN: [id number] DOB: 04-07-1955 Today's Date: 09/08/2021   History of Present Illness Pt is a 66 y.o. male who presented 08/02/21 with R foot pain and R-sided weakness. Transferred to [facility] 7/25. 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 S/p stent placement to the R vertebrobasilar junction 7/26, failed extubation. Trach and PEG placed 7/28. S/p bronchoscopic evaluation, oral reintubation and tracheostomy removal with stoma packing 7/31 due to trach site bleeding. S/p trach revision 8/2. PMH: DM, GERD, TBI with prior ICH, HLD, HTN    PT Comments    Patient progressing and noted able to lift his head to neutral when sitting on EOB.  Minor movements for rotation or lateral flexion, but tolerated trunk and cervical stretches while at EOB.  Noted sitting balance at times with mod A posteriorly, but not flaccid nor having to give as much support as anticipated.  Patient benefited as well from getting out of the room in tilt in space wheelchair looking out window in hallway.  He will continue to benefit from skilled PT in the acute setting.  Continue to recommend SNF level rehab at d/c.   Recommendations for follow up therapy are one component of a multi-disciplinary discharge planning process, led by the attending physician.  Recommendations may be updated based on patient status, additional functional criteria and insurance authorization.  Follow Up Recommendations  PT at Long-term acute care hospital Can patient physically be transported by private vehicle: No   Assistance  Recommended at Discharge Frequent or constant Supervision/Assistance  Patient can return home with the following Assistance with cooking/housework;Two people to help with walking and/or transfers;Two people to help with bathing/dressing/bathroom;Assistance with feeding;Direct supervision/assist for medications management;Direct supervision/assist for financial management;Assist for transportation;Help with stairs or ramp for entrance   Equipment Recommendations  Other (comment) (TBA at next venue)    Recommendations for Other Services       Precautions / Restrictions Precautions Precautions: Fall Precaution Comments: trach collar, peg, SBP < 180, prevalon boots, copious trach secretions, watch HR Restrictions Weight Bearing Restrictions: No Other Position/Activity Restrictions: splint on LUE to manage lines     Mobility  Bed Mobility Overal bed mobility: Needs Assistance Bed Mobility: Rolling Rolling: Total assist, +2 for physical assistance, +2 for safety/equipment Sidelying to sit: Total assist, +2 for physical assistance Supine to sit: Total assist, +2 for physical assistance, +2 for safety/equipment, HOB elevated     General bed mobility comments: total A to transfer to sitting EOB for exercises    Transfers   Equipment used: Ambulation equipment used Transfers: Bed to chair/wheelchair/BSC               Transfer via Lift Equipment: Maximove  Ambulation/Gait                   Stairs             Wheelchair Mobility    Modified Rankin (Stroke Patients Only) Modified Rankin (Stroke Patients Only) Pre-Morbid Rankin Score: No symptoms Modified Rankin: Severe disability     Balance Overall balance assessment: Needs assistance Sitting-balance support: No upper extremity supported Sitting balance-Leahy Scale: Poor Sitting balance -  Comments: mod A to total A for balance on EOB; worked on trunk mobility with rotation and lateral leans for UE weight  bearing seated EOB about 10-15 minutes; noted pt lifting head to command for looking out window, etc                                    Cognition Arousal/Alertness: Awake/alert Behavior During Therapy: WFL for tasks assessed/performed Overall Cognitive Status: Difficult to assess                         Following Commands: Follows one step commands consistently       General Comments: awake and mouths single word responses appropriately, but when attempting more, hard to understand        Exercises Other Exercises Other Exercises: PROM cervical rotation L&R 10 second holds Other Exercises: L&R trunk rotation PROM stretching 5 second holds    General Comments General comments (skin integrity, edema, etc.): on trach collar '@28'$ %      Pertinent Vitals/Pain Pain Assessment Faces Pain Scale: Hurts little more Pain Location: grimacing with LE stretching Pain Descriptors / Indicators: Grimacing Pain Intervention(s): Monitored during session, Repositioned    Home Living                          Prior Function            PT Goals (current goals can now be found in the care plan section) Progress towards PT goals: Progressing toward goals    Frequency    Min 3X/week      PT Plan Current plan remains appropriate    Co-evaluation PT/OT/SLP Co-Evaluation/Treatment: Yes Reason for Co-Treatment: Complexity of the patient's impairments (multi-system involvement);For patient/therapist safety;Necessary to address cognition/behavior during functional activity PT goals addressed during session: Mobility/safety with mobility;Proper use of DME OT goals addressed during session: ADL's and self-care      AM-PAC PT "6 Clicks" Mobility   Outcome Measure  Help needed turning from your back to your side while in a flat bed without using bedrails?: Total Help needed moving from lying on your back to sitting on the side of a flat bed without using  bedrails?: Total Help needed moving to and from a bed to a chair (including a wheelchair)?: Total Help needed standing up from a chair using your arms (e.g., wheelchair or bedside chair)?: Total Help needed to walk in hospital room?: Total Help needed climbing 3-5 steps with a railing? : Total 6 Click Score: 6    End of Session Equipment Utilized During Treatment: Oxygen Activity Tolerance: Patient tolerated treatment well Patient left: in chair;with call bell/phone within reach;with chair alarm set Nurse Communication: Mobility status;Need for lift equipment PT Visit Diagnosis: Muscle weakness (generalized) (M62.81);Other symptoms and signs involving the nervous system (R29.898);Other abnormalities of gait and mobility (R26.89);Other (comment) Hemiplegia - caused by: Cerebral infarction     Time: 1130-1226 PT Time Calculation (min) (ACUTE ONLY): 56 min  Charges:  $Therapeutic Activity: 23-37 mins                     [name], PT Acute Rehabilitation Services Office:[phone] 09/08/2021    [name] 09/08/2021, 3:55 PM

## 2021-09-09 LAB — GLUCOSE, CAPILLARY
Glucose-Capillary: 159 mg/dL — ABNORMAL HIGH (ref 70–99)
Glucose-Capillary: 117 mg/dL — ABNORMAL HIGH (ref 70–99)
Glucose-Capillary: 199 mg/dL — ABNORMAL HIGH (ref 70–99)
Glucose-Capillary: 198 mg/dL — ABNORMAL HIGH (ref 70–99)
Glucose-Capillary: 134 mg/dL — ABNORMAL HIGH (ref 70–99)
Glucose-Capillary: 128 mg/dL — ABNORMAL HIGH (ref 70–99)

## 2021-09-09 NOTE — Progress Notes (Addendum)
PROGRESS NOTE    Joshua Donovan  ZTI:[id number] DOB: [birth year] DOA: 08/03/2021 PCP: [facility] [place]  No chief complaint on file.   Brief Narrative:  Patient with history of ICH, type II DM, HTN, HLD presented to [facility] on 7/24 with right hand numbness and weakness, initially found to have a small brainstem stroke and ventral medulla.  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  He was intubated and transferred to [facility] for interventional radiology intervention.  Remained in the intensive care unit.  Now with tracheostomy and PEG tube placement.  Pending LTAC transfer.  7/24 presented to [facility], ventral medulla CVA 7/25 tx to [facility], treated with Cleviprex. 7/26 cerebral angiogram with stent placement to right vertebro-basilar junction, failed extubation, MRI brain> mild extension of stroke, now involving bilateral medial medullary, patent basilar artery and R VBJ stent  7/28 bedside tracheostomy by PCCM and PEG tube placement by general surgery 7/30 Hypertension persist despite adding oral agents, glucose remains elevated as well 7/31 Bleeding around trach site, bright red blood.  Underwent bronchoscopy, Trach Removed, patient orally reintubated, stoma packed. 8/2 ENT took patient to OR for trach redo 8/9 transferred to medical floor on tracheostomy now. 8/12 vomiting after bolus feeding with worsening hypoxia. 8/13 copious bleeding from the heparin injection site controlled with pressure dressing. 8/14 bleeding has resolved.  Trach secretions blood-tinged. 8/15 insurance denied LTAC appeal.  CSW working on SNF. 8/16>8/22: 25 beat run of NSVT on 8/21 8/28: peer to peer for LTAC looks like this was denied- Interval x-ray showed basilar opacities--respiratory culture ordered, hypertonic saline started scopolamine discontinued 8/29: V. tach runs    Assessment & Plan:   Principal Problem:   Acute stroke of medulla  oblongata ([place]) Active Problems:   Acute respiratory failure with hypoxia ([place])   Hypertension   Uncontrolled type 2 diabetes mellitus with hyperglycemia (HCC)   Mixed hyperlipidemia   Alcohol abuse   NSVT (nonsustained ventricular tachycardia) (HCC)   Dysphagia   Tracheostomy dependent (HCC)   Assessment and Plan: * 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HCC) initially loaded with aspirin and Brilinta.  Cerebral angiogram performed on 7/26 with placement of a stent of right vertebro-basilar junction. 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 Concern for possible aspiration pneumonitis. Patient unable to extubate. Tracheostomy placed on 7/31 and revised by ENT on 8/2 secondary to bleeding-? Decanulation vs downsize per CCM Currently stable. -PCCM for trach management--last seen 8/28 -Guaifenesin q4 hours to help with secretions, Scopolamine stopped--CXR more atelectasis> PNA -hypertonic saline neb BID for mucociliary clearance -Respiratory cultures showed staph, likely colonization (no fever curve no leukocytosis)  Alcohol abuse Initially managed on CIWA--now completely resolved Continue thiamine, multivitamin and folic acid  Mixed hyperlipidemia -Continue Lipitor  Uncontrolled type 2 diabetes mellitus with hyperglycemia/hypoglycemia (HCC)A1C of 8.3%.= -Continue Levemir to 55 units daily and Novolog 10 units every 4 hours -CBG 100-200  Hypertension Previously with hypertensive emergency which has now resolved. -Continue amlodipine 10, hydrochlorothiazide 12.5 and Cor 37.5 twice daily eg  NSVT (nonsustained ventricular tachycardia) (HCC) Patient with intermittent Vtach. Most recently with 25 beats of vtach on 8/29. QTc of 424 msec -Replete potassium and  magnesium--periodic re-checks -Cardiology saw patient previously 8/2 recommended-- maintain mag >2 and k >4, no additional cardiac w/u   Dysphagia Patient is  s/p PEG tube and is on tube feeds via PEG. -Continue tube feeds and free water 100 cc  Tracheostomy complication (HCC)-resolved as of 08/25/2021 In setting of DAPT. ENT consulted for revision. Bleeding resolved.      DVT prophylaxis: SCD Code Status: full Family Communication: discussed with daughter bedside Disposition:   Status is: Inpatient Remains inpatient appropriate because: no safe d/c plan   Consultants:  PCCM Neurology ENT  Procedures:  echo  Antimicrobials:  Anti-infectives (From admission, onward)    Start     Dose/Rate Route Frequency Ordered Stop   08/11/21 1400  ceFEPIme (MAXIPIME) 2 g in sodium chloride 0.9 % 100 mL IVPB        2 g 200 mL/hr over 30 Minutes Intravenous Every 8 hours 08/11/21 0957 08/16/21 0633   08/09/21 1300  cefTRIAXone (ROCEPHIN) 1 g in sodium chloride 0.9 % 100 mL IVPB  Status:  Discontinued        1 g 200 mL/hr over 30 Minutes Intravenous Every 24 hours 08/09/21 1151 08/11/21 0957   08/08/21 1200  ceFEPIme (MAXIPIME) 2 g in sodium chloride 0.9 % 100 mL IVPB  Status:  Discontinued        2 g 200 mL/hr over 30 Minutes Intravenous Every 8 hours 08/08/21 1104 08/09/21 1151   08/06/21 1500  cefTRIAXone (ROCEPHIN) 2 g in sodium chloride 0.9 % 100 mL IVPB  Status:  Discontinued        2 g 200 mL/hr over 30 Minutes Intravenous Every 24 hours 08/06/21 1421 08/08/21 1104   08/04/21 1006  ceFAZolin (ANCEF) 2-4 GM/100ML-% IVPB       Note to Pharmacy: Margaretmary Dys E: cabinet override      08/04/21 1006 08/04/21 2214      Subjective: No c/o pain, discomfort--seen with daughter in room  Objective: Vitals:   09/09/21 0805 09/09/21 1039 09/09/21 1148 09/09/21 1429  BP:   (!) 143/87   Pulse: 88 85 86 88  Resp: (!) 34 (!) 25 (!) 31 (!) 28  Temp:   98.5 F (36.9 C)   TempSrc:    Oral   SpO2: 95% 95% 96% 95%  Weight:      Height:        Intake/Output Summary (Last 24 hours) at 09/09/2021 1607 Last data filed at 09/09/2021 0945 Gross per 24 hour  Intake 900 ml  Output 1950 ml  Net -1050 ml    Filed Weights   09/06/21 0500 09/07/21 0500 09/08/21 0500  Weight: 80.5 kg 80.5 kg 80.5 kg    Examination:  Tends to be able to mouth words but cannot use the device at the bedside Overall otherwise fair Chest anterolaterally is clear S1 s2  no m Abdomen is soft with PEG tube in place   Data Reviewed: I have personally reviewed following labs and imaging studies  CBC: Recent Labs  Lab 09/03/21 0233 09/04/21 0209 09/06/21 0247 09/07/21 0155  WBC 8.5 9.4 8.3 8.3  NEUTROABS 5.9 7.0 5.7 6.1  HGB 12.8* 12.3* 12.7* 12.3*  HCT 38.3* 38.7* 38.1* 37.9*  MCV 86.1 87.2 85.0 87.1  PLT 324 291 278 263     Basic Metabolic Panel: Recent Labs  Lab 09/03/21 0233 09/04/21 0209 09/06/21 0247 09/07/21 0155 09/08/21 1259  NA 141 138 137 133* 137  K 3.7 4.0 3.6 3.3* 4.4  CL 102 102 99 97* 100  CO2 '29 27 28 26 26  '$ GLUCOSE 92 142* 115* 157* 177*  BUN 20 19 17  15 16  CREATININE 0.71 0.68 0.63 0.57* 0.65  CALCIUM 9.4 9.1 8.8* 8.7* 9.4  MG 2.0 1.7 1.7 1.6* 1.8  PHOS 4.8* 4.7* 4.2 3.7  --      GFR: Estimated Creatinine Clearance: 92.4 mL/min (by C-G formula based on SCr of 0.65 mg/dL).  Liver Function Tests: Recent Labs  Lab 09/03/21 0233 09/04/21 0209 09/06/21 0247 09/07/21 0155  AST 48* 38 27 31  ALT 77* 75* 61* 64*  ALKPHOS 39 42 39 39  BILITOT 0.4 0.2* 0.4 0.3  PROT 6.7 6.5 6.5 6.2*  ALBUMIN 2.6* 2.5* 2.5* 2.5*     CBG: Recent Labs  Lab 09/08/21 2312 09/09/21 0349 09/09/21 0800 09/09/21 1147 09/09/21 1603  GLUCAP 146* 159* 117* 199* 198*      Recent Results (from the past 240 hour(s))  Culture, Respiratory w Gram Stain     Status: None (Preliminary result)   Collection Time: 09/06/21  7:21 PM   Specimen: Tracheal Aspirate;  Respiratory  Result Value Ref Range Status   Specimen Description TRACHEAL ASPIRATE  Final   Special Requests NONE  Final   Gram Stain   Final    FEW WBC PRESENT, PREDOMINANTLY PMN MODERATE GRAM POSITIVE COCCI FEW GRAM NEGATIVE RODS RARE GRAM POSITIVE RODS    Culture   Final    MODERATE STAPHYLOCOCCUS AUREUS SUSCEPTIBILITIES TO FOLLOW MODERATE STREPTOCOCCUS,BETA HEMOLYTIC NOT GROUP A Beta hemolytic streptococci are predictably susceptible to penicillin and other beta lactams. Susceptibility testing not routinely performed. Performed at [facility], [place] [address] [place], [place] [address]    Report Status PENDING  Incomplete     Radiology Studies: No results found.   Scheduled Meds:  amLODipine  10 mg Per Tube Daily   aspirin  81 mg Per Tube Daily   atorvastatin  80 mg Per Tube Daily   carvedilol  37.5 mg Per Tube BID WC   diclofenac Sodium  2 g Topical QID   feeding supplement (PROSource TF20)  60 mL Per Tube Daily   fiber supplement (BANATROL TF)  60 mL Per Tube BID   folic acid  1 mg Per Tube Daily   free water  100 mL Per Tube Q4H   guaiFENesin  10 mL Per Tube Q4H   hydrochlorothiazide  12.5 mg Per Tube Daily   insulin aspart  10 Units Subcutaneous Q4H   insulin detemir  55 Units Subcutaneous Daily   liver oil-zinc oxide   Topical TID   multivitamin with minerals  1 tablet Per Tube Daily   mouth rinse  15 mL Mouth Rinse Q2H   potassium chloride  40 mEq Per Tube Daily   sodium chloride HYPERTONIC  4 mL Nebulization BID   thiamine  100 mg Per Tube Daily   ticagrelor  90 mg Per Tube BID   Continuous Infusions:  feeding supplement ([name]) 1,000 mL (09/09/21 0116)     LOS: 37 days    Time spent: [place], MD [facility]   To contact the attending provider between 7A-7P or the covering provider during after hours 7P-7A, please log into the web site [email or web address] and access using universal [place] password for  that web site. If you do not have the password, please call the hospital operator.  09/09/2021, 4:07 PM

## 2021-09-10 LAB — GLUCOSE, CAPILLARY
Glucose-Capillary: 137 mg/dL — ABNORMAL HIGH (ref 70–99)
Glucose-Capillary: 149 mg/dL — ABNORMAL HIGH (ref 70–99)
Glucose-Capillary: 169 mg/dL — ABNORMAL HIGH (ref 70–99)
Glucose-Capillary: 155 mg/dL — ABNORMAL HIGH (ref 70–99)
Glucose-Capillary: 117 mg/dL — ABNORMAL HIGH (ref 70–99)
Glucose-Capillary: 165 mg/dL — ABNORMAL HIGH (ref 70–99)

## 2021-09-10 LAB — BASIC METABOLIC PANEL
Anion gap: 10 (ref 5–15)
BUN: 16 mg/dL (ref 8–23)
CO2: 27 mmol/L (ref 22–32)
Calcium: 8.9 mg/dL (ref 8.9–10.3)
Chloride: 100 mmol/L (ref 98–111)
Creatinine, Ser: 0.59 mg/dL — ABNORMAL LOW (ref 0.61–1.24)
GFR, Estimated: >60 mL/min (ref >60)
Glucose, Bld: 115 mg/dL — ABNORMAL HIGH (ref 70–99)
Potassium: 3.5 mmol/L (ref 3.5–5.1)
Sodium: 137 mmol/L (ref 135–145)

## 2021-09-10 LAB — CULTURE, RESPIRATORY W GRAM STAIN

## 2021-09-10 LAB — MAGNESIUM: Magnesium: 1.6 mg/dL — ABNORMAL LOW (ref 1.7–2.4)

## 2021-09-10 MED ORDER — MAGNESIUM SULFATE 2 GM/50ML IV SOLN
2.0000 g | Freq: Once | INTRAVENOUS | Status: AC
  Administered 2021-09-10: 2 g via INTRAVENOUS
  Filled 2021-09-10: qty 50

## 2021-09-10 NOTE — Progress Notes (Signed)
PROGRESS NOTE    Joshua Donovan  OVF:[id number] DOB: 05-09-1955 DOA: 08/03/2021 PCP: [facility] [place]  No chief complaint on file.   Brief Narrative:  Patient with history of ICH, type II DM, HTN, HLD presented to [facility] on 7/24 with right hand numbness and weakness, initially found to have a small brainstem stroke and ventral medulla.  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  He was intubated and transferred to [facility] for interventional radiology intervention.  Remained in the intensive care unit.  Now with tracheostomy and PEG tube placement.  Pending LTAC transfer.  7/24 presented to [facility], ventral medulla CVA 7/25 tx to [facility], treated with Cleviprex. 7/26 cerebral angiogram with stent placement to right vertebro-basilar junction, failed extubation, MRI brain> mild extension of stroke, now involving bilateral medial medullary, patent basilar artery and R VBJ stent  7/28 bedside tracheostomy by PCCM and PEG tube placement by general surgery 7/30 Hypertension persist despite adding oral agents, glucose remains elevated as well 7/31 Bleeding around trach site, bright red blood.  Underwent bronchoscopy, Trach Removed, patient orally reintubated, stoma packed. 8/2 ENT took patient to OR for trach redo 8/9 transferred to medical floor on tracheostomy now. 8/12 vomiting after bolus feeding with worsening hypoxia. 8/13 copious bleeding from the heparin injection site controlled with pressure dressing. 8/14 bleeding has resolved.  Trach secretions blood-tinged. 8/15 insurance denied LTAC appeal.  CSW working on SNF. 8/16>8/22: 25 beat run of NSVT on 8/21 8/28: peer to peer for LTAC looks like this was denied- Interval x-ray showed basilar opacities--respiratory culture ordered, hypertonic saline started scopolamine discontinued 8/29: V. tach runs    Assessment & Plan:   Principal Problem:   Acute stroke of medulla  oblongata ([place]) Active Problems:   Acute respiratory failure with hypoxia ([place])   Hypertension   Uncontrolled type 2 diabetes mellitus with hyperglycemia (HCC)   Mixed hyperlipidemia   Alcohol abuse   NSVT (nonsustained ventricular tachycardia) (HCC)   Dysphagia   Tracheostomy dependent (HCC)  SUBJECTIVE Overall unchanged no new events overnight   Assessment and Plan: * 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HCC) initially loaded with aspirin and Brilinta.  Cerebral angiogram performed on 7/26 with placement of a stent of right vertebro-basilar junction. Continues aspirin and Brilinta With dense quadriplegia, unable to move extremities Last note from neurology 8/4  Acute respiratory failure with hypoxia ([place]) required ICU admission and mechanical ventilation after admission secondary to worsening mentation. Concern for possible aspiration pneumonitis. Patient unable to extubate. Tracheostomy placed on 7/31 and revised by ENT on 8/2 secondary to bleeding-? Decanulation vs downsize per CCM when next seen Currently stable. -PCCM for trach management--last seen 8/28 -Guaifenesin q4 hours to help with secretions, Scopolamine stopped--CXR more atelectasis> PNA-periodic x-ray -hypertonic saline neb BID for mucociliary clearance -Respiratory cultures showed staph, likely colonization (no fever curve no leukocytosis)  Alcohol abuse Initially managed on CIWA--now completely resolved Continue thiamine, multivitamin and folic acid  Mixed hyperlipidemia -Continue Lipitor  Uncontrolled type 2 diabetes mellitus with hyperglycemia/hypoglycemia (HCC)A1C of 8.3%.= -Continue Levemir to 55 units daily and Novolog 10 units every 4 hours -CBG 150s to 180s  Hypertension moderately controlled Previously with hypertensive emergency which has now resolved. -Continue amlodipine 10, hydrochlorothiazide 12.5 and Cor 37.5 twice daily  As needed labetalol  NSVT (nonsustained ventricular tachycardia)  (HCC) Patient with intermittent Vtach. Most recently with 25 beats of vtach on 8/29. QTc of 424 msec -Replete potassium and magnesium--periodic re-checks -Cardiology saw  patient previously 8/2 recommended-- maintain mag >2 and k >4, no additional cardiac w/u  No further symptoms since sinus predominant  Dysphagia Patient is s/p PEG tube and is on tube feeds via PEG. -Continue tube feeds and free water 100 cc  Tracheostomy complication (HCC)-resolved as of 08/25/2021 In setting of DAPT. ENT consulted for revision. Bleeding resolved.  Small sacral decubitus prior to admission Has pressure injury sacrum and mild pressure injury on left thigh     DVT prophylaxis: SCD Code Status: full Family Communication: discussed with daughter bedside Disposition:   Status is: Inpatient Remains inpatient appropriate because: no safe d/c plan   Consultants:  PCCM Neurology ENT  Procedures:  echo  Antimicrobials:  Anti-infectives (From admission, onward)    Start     Dose/Rate Route Frequency Ordered Stop   08/11/21 1400  ceFEPIme (MAXIPIME) 2 g in sodium chloride 0.9 % 100 mL IVPB        2 g 200 mL/hr over 30 Minutes Intravenous Every 8 hours 08/11/21 0957 08/16/21 0633   08/09/21 1300  cefTRIAXone (ROCEPHIN) 1 g in sodium chloride 0.9 % 100 mL IVPB  Status:  Discontinued        1 g 200 mL/hr over 30 Minutes Intravenous Every 24 hours 08/09/21 1151 08/11/21 0957   08/08/21 1200  ceFEPIme (MAXIPIME) 2 g in sodium chloride 0.9 % 100 mL IVPB  Status:  Discontinued        2 g 200 mL/hr over 30 Minutes Intravenous Every 8 hours 08/08/21 1104 08/09/21 1151   08/06/21 1500  cefTRIAXone (ROCEPHIN) 2 g in sodium chloride 0.9 % 100 mL IVPB  Status:  Discontinued        2 g 200 mL/hr over 30 Minutes Intravenous Every 24 hours 08/06/21 1421 08/08/21 1104   08/04/21 1006  ceFAZolin (ANCEF) 2-4 GM/100ML-% IVPB       Note to Pharmacy: Margaretmary Dys E: cabinet override      08/04/21 1006 08/04/21  2214      Subjective: No c/o pain, discomfort Communicated by mouthing  Objective: Vitals:   09/10/21 1108 09/10/21 1200 09/10/21 1453 09/10/21 1609  BP: (!) 138/90 131/87    Pulse:  86 87 84  Resp: (!) 26 (!) 36 (!) 28 (!) 28  Temp:  98.6 F (37 C)  98.7 F (37.1 C)  TempSrc:  Oral  Oral  SpO2:  95% 98% 96%  Weight:      Height:        Intake/Output Summary (Last 24 hours) at 09/10/2021 1745 Last data filed at 09/10/2021 0415 Gross per 24 hour  Intake 800 ml  Output 500 ml  Net 300 ml    Filed Weights   09/06/21 0500 09/07/21 0500 09/08/21 0500  Weight: 80.5 kg 80.5 kg 80.5 kg    Examination:  Tends to be able to mouth words but cannot use the device at the bedside Overall otherwise fair Chest anterolaterally is clear Abdomen is soft with PEG tube in place Skin intact but didn't examine sacral area  Response and mild severity  Data Reviewed: I have personally reviewed following labs and imaging studies  CBC: Recent Labs  Lab 09/04/21 0209 09/06/21 0247 09/07/21 0155  WBC 9.4 8.3 8.3  NEUTROABS 7.0 5.7 6.1  HGB 12.3* 12.7* 12.3*  HCT 38.7* 38.1* 37.9*  MCV 87.2 85.0 87.1  PLT 291 278 263     Basic Metabolic Panel: Recent Labs  Lab 09/04/21 0209 09/06/21 0247 09/07/21 0155 09/08/21 1259  09/10/21 0352  NA 138 137 133* 137 137  K 4.0 3.6 3.3* 4.4 3.5  CL 102 99 97* 100 100  CO2 '27 28 26 26 27  '$ GLUCOSE 142* 115* 157* 177* 115*  BUN '19 17 15 16 16  '$ CREATININE 0.68 0.63 0.57* 0.65 0.59*  CALCIUM 9.1 8.8* 8.7* 9.4 8.9  MG 1.7 1.7 1.6* 1.8 1.6*  PHOS 4.7* 4.2 3.7  --   --      GFR: Estimated Creatinine Clearance: 92.4 mL/min (A) (by C-G formula based on SCr of 0.59 mg/dL (L)).  Liver Function Tests: Recent Labs  Lab 09/04/21 0209 09/06/21 0247 09/07/21 0155  AST 38 27 31  ALT 75* 61* 64*  ALKPHOS 42 39 39  BILITOT 0.2* 0.4 0.3  PROT 6.5 6.5 6.2*  ALBUMIN 2.5* 2.5* 2.5*     CBG: Recent Labs  Lab 09/09/21 2305 09/10/21 0300  09/10/21 0805 09/10/21 1201 09/10/21 1607  GLUCAP 128* 137* 149* 169* 155*      Recent Results (from the past 240 hour(s))  Culture, Respiratory w Gram Stain     Status: None   Collection Time: 09/06/21  7:21 PM   Specimen: Tracheal Aspirate; Respiratory  Result Value Ref Range Status   Specimen Description TRACHEAL ASPIRATE  Final   Special Requests NONE  Final   Gram Stain   Final    FEW WBC PRESENT, PREDOMINANTLY PMN MODERATE GRAM POSITIVE COCCI FEW GRAM NEGATIVE RODS RARE GRAM POSITIVE RODS    Culture   Final    MODERATE STAPHYLOCOCCUS AUREUS MODERATE STREPTOCOCCUS,BETA HEMOLYTIC NOT GROUP A Beta hemolytic streptococci are predictably susceptible to penicillin and other beta lactams. Susceptibility testing not routinely performed. Performed at [facility], [place] [address], [place], [place] [address]    Report Status 09/10/2021 FINAL  Final   Organism ID, Bacteria STAPHYLOCOCCUS AUREUS  Final      Susceptibility   Staphylococcus aureus - MIC*    CIPROFLOXACIN >=8 RESISTANT Resistant     ERYTHROMYCIN >=8 RESISTANT Resistant     GENTAMICIN <=0.5 SENSITIVE Sensitive     OXACILLIN 0.5 SENSITIVE Sensitive     TETRACYCLINE <=1 SENSITIVE Sensitive     VANCOMYCIN <=0.5 SENSITIVE Sensitive     TRIMETH/SULFA <=10 SENSITIVE Sensitive     CLINDAMYCIN <=0.25 SENSITIVE Sensitive     RIFAMPIN <=0.5 SENSITIVE Sensitive     Inducible Clindamycin NEGATIVE Sensitive     * MODERATE STAPHYLOCOCCUS AUREUS     Radiology Studies: No results found.   Scheduled Meds:  amLODipine  10 mg Per Tube Daily   aspirin  81 mg Per Tube Daily   atorvastatin  80 mg Per Tube Daily   carvedilol  37.5 mg Per Tube BID WC   diclofenac Sodium  2 g Topical QID   feeding supplement (PROSource TF20)  60 mL Per Tube Daily   fiber supplement (BANATROL TF)  60 mL Per Tube BID   folic acid  1 mg Per Tube Daily   free water  100 mL Per Tube Q4H   guaiFENesin  10 mL Per Tube Q4H   hydrochlorothiazide   12.5 mg Per Tube Daily   insulin aspart  10 Units Subcutaneous Q4H   insulin detemir  55 Units Subcutaneous Daily   liver oil-zinc oxide   Topical TID   multivitamin with minerals  1 tablet Per Tube Daily   mouth rinse  15 mL Mouth Rinse Q2H   potassium chloride  40 mEq Per Tube Daily  thiamine  100 mg Per Tube Daily   ticagrelor  90 mg Per Tube BID   Continuous Infusions:  feeding supplement ([name]) 1,000 mL (09/09/21 0116)     LOS: 38 days    Time spent: 25  Nita Sells, MD [facility]   To contact the attending provider between 7A-7P or the covering provider during after hours 7P-7A, please log into the web site [email or web address] and access using universal [place] password for that web site. If you do not have the password, please call the hospital operator.  09/10/2021, 5:45 PM

## 2021-09-10 NOTE — Progress Notes (Signed)
PT Cancellation Note  Patient Details Name: Joshua Donovan MRN: [id number] DOB: [birth year]   Cancelled Treatment:    Reason Eval/Treat Not Completed: Patient declined, no reason specified despite attempts educate and various mobility offers. Pt denies pain, states he is slightly fatigued at this time and continued to ask for therapy to return at later time. Will continue to follow and progress OOB mobility as tolerated.   [name], PT, DPT   Acute Rehabilitation Department   [name] 09/10/2021, 4:24 PM

## 2021-09-10 NOTE — Progress Notes (Signed)
Speech Language Pathology Treatment: Cognitive-Linquistic;Passy Muir Speaking valve  Patient Details Name: Joshua Donovan MRN: [id number] DOB: 18-Sep-1955 Today's Date: 09/10/2021 Time: 3762-8315 SLP Time Calculation (min) (ACUTE ONLY): 100 min  Assessment / Plan / Recommendation Clinical Impression  Pt seen with Joshua Donovan. Eye gaze easily calibrated. Pt able to sustain gaze to select a button on keyboard page to spell the first three letters of his name. Pt fatiguing quickly, session interrupted x2 for toileting needs. Worked on education with family and setting up device for best gaze and easy access. Pt needs further attempts to use functionally. Improving.  Also attempted PMSV placement, pressure after 5 seconds, no phonation. Pt does redirect air orally. Has potential for sip and puff for calling. Will f/u   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 He  presented 08/02/21 with R foot pain and R-sided weakness. Transferred to [facility] 7/25. 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 S/p stent placement to the R vertebrobasilar junction 7/26, failed extubation. Trach and PEG placed 7/28. PMH: DM, GERD, TBI with prior ICH, HLD, HTN      SLP Plan  Continue with current plan of care      Recommendations for follow up therapy are one component of a multi-disciplinary discharge planning process, led by the attending physician.  Recommendations may be updated based on patient status, additional functional criteria and insurance authorization.    Recommendations         Patient may use Passy-Muir Speech Valve: with  SLP only         Follow Up Recommendations: Skilled nursing-short term rehab (<3 hours/day) Assistance recommended at discharge: Frequent or constant Supervision/Assistance SLP Visit Diagnosis: Dysarthria and anarthria (R47.1) Plan: Continue with current plan of care           Seldon Barrell, [name]  09/10/2021, 12:23 PM

## 2021-09-11 LAB — GLUCOSE, CAPILLARY
Glucose-Capillary: 103 mg/dL — ABNORMAL HIGH (ref 70–99)
Glucose-Capillary: 189 mg/dL — ABNORMAL HIGH (ref 70–99)
Glucose-Capillary: 202 mg/dL — ABNORMAL HIGH (ref 70–99)
Glucose-Capillary: 129 mg/dL — ABNORMAL HIGH (ref 70–99)
Glucose-Capillary: 149 mg/dL — ABNORMAL HIGH (ref 70–99)
Glucose-Capillary: 170 mg/dL — ABNORMAL HIGH (ref 70–99)

## 2021-09-11 NOTE — Progress Notes (Signed)
PROGRESS NOTE    Joshua Donovan  DTO:[id number] DOB: 09-09-1955 DOA: 08/03/2021 PCP: [facility] [place]  No chief complaint on file.   Brief Narrative:  Patient with history of ICH, type II DM, HTN, HLD presented to [facility] on 7/24 with right hand numbness and weakness, initially found to have a small brainstem stroke and ventral medulla.  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  He was intubated and transferred to [facility] for interventional radiology intervention.  Remained in the intensive care unit.  Now with tracheostomy and PEG tube placement.  Pending LTAC transfer.  7/24 presented to [facility], ventral medulla CVA 7/25 tx to [facility], treated with Cleviprex. 7/26 cerebral angiogram with stent placement to right vertebro-basilar junction, failed extubation, MRI brain> mild extension of stroke, now involving bilateral medial medullary, patent basilar artery and R VBJ stent  7/28 bedside tracheostomy by PCCM and PEG tube placement by general surgery 7/30 Hypertension persist despite adding oral agents, glucose remains elevated as well 7/31 Bleeding around trach site, bright red blood.  Underwent bronchoscopy, Trach Removed, patient orally reintubated, stoma packed. 8/2 ENT took patient to OR for trach redo 8/9 transferred to medical floor on tracheostomy now. 8/12 vomiting after bolus feeding with worsening hypoxia. 8/13 copious bleeding from the heparin injection site controlled with pressure dressing. 8/14 bleeding has resolved.  Trach secretions blood-tinged. 8/15 insurance denied LTAC appeal.  CSW working on SNF. 8/16>8/22: 25 beat run of NSVT on 8/21 8/28: peer to peer for LTAC looks like this was denied- Interval x-ray showed basilar opacities--respiratory culture ordered, hypertonic saline started scopolamine discontinued 8/29: V. tach runs-increased metoprolol Resolved 9/2: Slightly increased work of  breathing-low-grade temps 99 for chest x-ray ordered    Assessment & Plan:   Principal Problem:   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Joshua Donovan) Active Problems:   Acute respiratory failure with hypoxia (Joshua Donovan)   Hypertension   Uncontrolled type 2 diabetes mellitus with hyperglycemia (HCC)   Mixed hyperlipidemia   Alcohol abuse   NSVT (nonsustained ventricular tachycardia) ([place])   Dysphagia   Tracheostomy dependent (HCC)    Assessment and Plan: * 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Joshua Donovan) initially loaded with aspirin and Brilinta.  Cerebral angiogram performed on 7/26 with placement of a stent of right vertebro-basilar junction. 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 Concern for possible aspiration pneumonitis. Patient unable to extubate. Tracheostomy placed on 7/31 and revised by ENT on 8/2 secondary to bleeding-? Decanulation vs downsize per CCM when next seen Currently stable. -PCCM for trach management--last seen 8/28 -Guaifenesin q4 hours to help with secretions, Scopolamine stopped--CXR more atelectasis> PNA-periodic x-ray -hypertonic saline neb BID for mucociliary clearance -Respiratory cultures 8/28 showed staph and felt to be likely colonized -Low-grade temps 9/2 with therefore getting chest x-ray  Alcohol abuse Initially managed on CIWA--now completely resolved Continue thiamine, multivitamin and folic acid  Mixed hyperlipidemia -Continue Lipitor  Uncontrolled type 2 diabetes mellitus with hyperglycemia/hypoglycemia (HCC)A1C of 8.3%.= -Continue Levemir to 55 units daily and Novolog 10 units every 4 hours -CBG 1 89-202  Hypertension moderately controlled Previously with hypertensive emergency which has now resolved. -Continue amlodipine 10,  hydrochlorothiazide 12.5 and Cor 37.5 twice daily  As needed labetalol  NSVT (nonsustained ventricular tachycardia) (HCC) Patient with intermittent Vtach. Most recently with 25 beats of  vtach on 8/29. QTc of 424 msec -Replete potassium and magnesium--periodic re-checks -Cardiology saw patient previously 8/2 recommended-- maintain mag >2 and k >4, no additional cardiac w/u  No further symptoms since sinus predominant  Dysphagia Patient is s/p PEG tube and is on tube feeds via PEG. -Continue tube feeds and free water 100 cc Repeat labs in the morning  Tracheostomy complication (HCC)-resolved as of 08/25/2021 In setting of DAPT. ENT consulted for revision. Bleeding resolved.  Small sacral decubitus prior to admission Has pressure injury sacrum and mild pressure injury on left thigh     DVT prophylaxis: SCD Code Status: full Family Communication: discussed with daughter [name] bedside previously on 8/29 Disposition:   Status is: Inpatient Remains inpatient appropriate because: no safe d/c plan   Consultants:  PCCM Neurology ENT  Procedures:  echo  Antimicrobials:    Subjective: No c/o pain, discomfort Communicated by mouthing Noted slightly increased work of breathing-nursing reports low-grade temps in the 99 range with thick secretions He does not seem in distress however  Objective: Vitals:   09/11/21 0730 09/11/21 0810 09/11/21 1105 09/11/21 1128  BP: (!) 166/87   (!) 148/81  Pulse: 92 92 90 88  Resp: (!) 26 (!) 32 (!) 33 (!) 30  Temp: 99.1 F (37.3 C)   98.8 F (37.1 C)  TempSrc: Oral   Axillary  SpO2: 94% 95% 99% 95%  Weight:      Height:        Intake/Output Summary (Last 24 hours) at 09/11/2021 1611 Last data filed at 09/11/2021 0333 Gross per 24 hour  Intake --  Output 1400 ml  Net -1400 ml   Filed Weights   09/07/21 0500 09/08/21 0500 09/11/21 0437  Weight: 80.5 kg 80.5 kg 84 kg    Examination:  Mouths words-coherent Chest anterolaterally is  clear-cannot appreciate any rales rhonchi posterolaterally increased WOB Abdomen is soft with PEG tube in place S1-S2 no murmur no rub no gallop Skin intact but didn't examine sacral area today Swollen bilaterally which is chronic  Data Reviewed: I have personally reviewed following labs and imaging studies  CBC: Recent Labs  Lab 09/06/21 0247 09/07/21 0155  WBC 8.3 8.3  NEUTROABS 5.7 6.1  HGB 12.7* 12.3*  HCT 38.1* 37.9*  MCV 85.0 87.1  PLT 278 263     Basic Metabolic Panel: Recent Labs  Lab 09/06/21 0247 09/07/21 0155 09/08/21 1259 09/10/21 0352  NA 137 133* 137 137  K 3.6 3.3* 4.4 3.5  CL 99 97* 100 100  CO2 '28 26 26 27  '$ GLUCOSE 115* 157* 177* 115*  BUN '17 15 16 16  '$ CREATININE 0.63 0.57* 0.65 0.59*  CALCIUM 8.8* 8.7* 9.4 8.9  MG 1.7 1.6* 1.8 1.6*  PHOS 4.2 3.7  --   --      GFR: Estimated Creatinine Clearance: 94.2 mL/min (A) (by C-G formula based on SCr of 0.59 mg/dL (L)).  Liver Function Tests: Recent Labs  Lab 09/06/21 0247 09/07/21 0155  AST 27 31  ALT 61* 64*  ALKPHOS 39 39  BILITOT 0.4 0.3  PROT 6.5 6.2*  ALBUMIN 2.5* 2.5*     CBG: Recent Labs  Lab 09/10/21 1951 09/10/21 2311 09/11/21 0330 09/11/21 0857 09/11/21 1135  GLUCAP 117* 165* 103* 189* 202*      Recent Results (from the past 240 hour(s))  Culture, Respiratory w Gram Stain     Status: None   Collection Time: 09/06/21  7:21 PM   Specimen: Tracheal Aspirate; Respiratory  Result Value Ref Range Status   Specimen Description TRACHEAL ASPIRATE  Final   Special Requests NONE  Final   Gram Stain   Final    FEW WBC PRESENT, PREDOMINANTLY PMN MODERATE GRAM POSITIVE COCCI FEW GRAM NEGATIVE RODS RARE GRAM POSITIVE RODS    Culture   Final    MODERATE STAPHYLOCOCCUS AUREUS MODERATE STREPTOCOCCUS,BETA HEMOLYTIC NOT GROUP A Beta hemolytic streptococci are predictably susceptible to penicillin and other beta lactams. Susceptibility testing not routinely performed. Performed at  [facility], [place] [address], [place], [place] [address]    Report Status 09/10/2021 FINAL  Final   Organism ID, Bacteria STAPHYLOCOCCUS AUREUS  Final      Susceptibility   Staphylococcus aureus - MIC*    CIPROFLOXACIN >=8 RESISTANT Resistant     ERYTHROMYCIN >=8 RESISTANT Resistant     GENTAMICIN <=0.5 SENSITIVE Sensitive     OXACILLIN 0.5 SENSITIVE Sensitive     TETRACYCLINE <=1 SENSITIVE Sensitive     VANCOMYCIN <=0.5 SENSITIVE Sensitive     TRIMETH/SULFA <=10 SENSITIVE Sensitive     CLINDAMYCIN <=0.25 SENSITIVE Sensitive     RIFAMPIN <=0.5 SENSITIVE Sensitive     Inducible Clindamycin NEGATIVE Sensitive     * MODERATE STAPHYLOCOCCUS AUREUS     Radiology Studies: No results found.   Scheduled Meds:  amLODipine  10 mg Per Tube Daily   aspirin  81 mg Per Tube Daily   atorvastatin  80 mg Per Tube Daily   carvedilol  37.5 mg Per Tube BID WC   diclofenac Sodium  2 g Topical QID   feeding supplement (PROSource TF20)  60 mL Per Tube Daily   fiber supplement (BANATROL TF)  60 mL Per Tube BID   folic acid  1 mg Per Tube Daily   free water  100 mL Per Tube Q4H   guaiFENesin  10 mL Per Tube Q4H   hydrochlorothiazide  12.5 mg Per Tube Daily   insulin aspart  10 Units Subcutaneous Q4H   insulin detemir  55 Units Subcutaneous Daily   liver oil-zinc oxide   Topical TID   multivitamin with minerals  1 tablet Per Tube Daily   mouth rinse  15 mL Mouth Rinse Q2H   potassium chloride  40 mEq Per Tube Daily   thiamine  100 mg Per Tube Daily   ticagrelor  90 mg Per Tube BID   Continuous Infusions:  feeding supplement ([name]) 1,000 mL (09/11/21 1342)     LOS: 39 days    Time spent: [place], MD [facility]   To contact the attending provider between 7A-7P or the covering provider during after hours 7P-7A, please log into the web site [email or web address] and access using universal [place] password for that web site. If you do not have the  password, please call the hospital operator.  09/11/2021, 4:06 PM

## 2021-09-12 ENCOUNTER — Inpatient Hospital Stay (HOSPITAL_COMMUNITY): Payer: Medicare PPO

## 2021-09-12 LAB — GLUCOSE, CAPILLARY
Glucose-Capillary: 125 mg/dL — ABNORMAL HIGH (ref 70–99)
Glucose-Capillary: 194 mg/dL — ABNORMAL HIGH (ref 70–99)
Glucose-Capillary: 187 mg/dL — ABNORMAL HIGH (ref 70–99)
Glucose-Capillary: 140 mg/dL — ABNORMAL HIGH (ref 70–99)
Glucose-Capillary: 142 mg/dL — ABNORMAL HIGH (ref 70–99)
Glucose-Capillary: 142 mg/dL — ABNORMAL HIGH (ref 70–99)

## 2021-09-12 LAB — BASIC METABOLIC PANEL
Anion gap: 10 (ref 5–15)
BUN: 17 mg/dL (ref 8–23)
CO2: 28 mmol/L (ref 22–32)
Calcium: 9 mg/dL (ref 8.9–10.3)
Chloride: 101 mmol/L (ref 98–111)
Creatinine, Ser: 0.62 mg/dL (ref 0.61–1.24)
GFR, Estimated: >60 mL/min (ref >60)
Glucose, Bld: 140 mg/dL — ABNORMAL HIGH (ref 70–99)
Potassium: 3.4 mmol/L — ABNORMAL LOW (ref 3.5–5.1)
Sodium: 139 mmol/L (ref 135–145)

## 2021-09-12 LAB — CBC WITH DIFFERENTIAL/PLATELET
Abs Immature Granulocytes: 0.03 10*3/uL (ref 0.00–0.07)
Basophils Absolute: 0.1 10*3/uL (ref 0.0–0.1)
Basophils Relative: 1 %
Eosinophils Absolute: 0.2 10*3/uL (ref 0.0–0.5)
Eosinophils Relative: 2 %
HCT: 38.5 % — ABNORMAL LOW (ref 39.0–52.0)
Hemoglobin: 12.8 g/dL — ABNORMAL LOW (ref 13.0–17.0)
Immature Granulocytes: 0 %
Lymphocytes Relative: 16 %
Lymphs Abs: 1.3 10*3/uL (ref 0.7–4.0)
MCH: 28.4 pg (ref 26.0–34.0)
MCHC: 33.2 g/dL (ref 30.0–36.0)
MCV: 85.6 fL (ref 80.0–100.0)
Monocytes Absolute: 0.7 10*3/uL (ref 0.1–1.0)
Monocytes Relative: 9 %
Neutro Abs: 5.7 10*3/uL (ref 1.7–7.7)
Neutrophils Relative %: 72 %
Platelets: 314 10*3/uL (ref 150–400)
RBC: 4.5 MIL/uL (ref 4.22–5.81)
RDW: 13.2 % (ref 11.5–15.5)
WBC: 8 10*3/uL (ref 4.0–10.5)
nRBC: 0 % (ref 0.0–0.2)

## 2021-09-12 NOTE — Progress Notes (Signed)
PROGRESS NOTE    Joshua Donovan  AOZ:[id number] DOB: Feb 09, 1955 DOA: 08/03/2021 PCP: [facility] [place]  No chief complaint on file.   Brief Narrative:  Patient with history of ICH, type II DM, HTN, HLD presented to [facility] on 7/24 with right hand numbness and weakness, initially found to have a small brainstem stroke and ventral medulla.  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  He was intubated and transferred to [facility] for interventional radiology intervention.  Remained in the intensive care unit.  Now with tracheostomy and PEG tube placement.  Pending LTAC transfer.  7/24 presented to [facility], ventral medulla CVA 7/25 tx to [facility], treated with Cleviprex. 7/26 cerebral angiogram with stent placement to right vertebro-basilar junction, failed extubation, MRI brain> mild extension of stroke, now involving bilateral medial medullary, patent basilar artery and R VBJ stent  7/28 bedside tracheostomy by PCCM and PEG tube placement by general surgery 7/30 Hypertension persist despite adding oral agents, glucose remains elevated as well 7/31 Bleeding around trach site, bright red blood.  Underwent bronchoscopy, Trach Removed, patient orally reintubated, stoma packed. 8/2 ENT took patient to OR for trach redo 8/9 transferred to medical floor on tracheostomy now. 8/12 vomiting after bolus feeding with worsening hypoxia. 8/13 copious bleeding from the heparin injection site controlled with pressure dressing. 8/14 bleeding has resolved.  Trach secretions blood-tinged. 8/15 insurance denied LTAC appeal.  CSW working on SNF. 8/16>8/22: 25 beat run of NSVT on 8/21 8/28: peer to peer for LTAC looks like this was denied- Interval x-ray showed basilar opacities--respiratory culture ordered, hypertonic saline started scopolamine discontinued 8/29: V. tach runs-increased metoprolol Resolved 9/2: Slightly increased work of  breathing-low-grade temps 99 for chest x-ray ordered    Assessment & Plan:   Principal Problem:   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[place]) Active Problems:   Acute respiratory failure with hypoxia ([place])   Hypertension   Uncontrolled type 2 diabetes mellitus with hyperglycemia (HCC)   Mixed hyperlipidemia   Alcohol abuse   NSVT (nonsustained ventricular tachycardia) ([place])   Dysphagia   Tracheostomy dependent (HCC)    Assessment and Plan: * 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[place]) initially loaded with aspirin and Brilinta.  Cerebral angiogram performed on 7/26 with placement of a stent of right vertebro-basilar junction. 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 Concern for possible aspiration pneumonitis. Tracheostomy placed on 7/31 and revised by ENT on 8/2 secondary to bleeding-? Decanulation vs downsize per CCM when next seen -PCCM for trach management--last seen 8/28 -Guaifenesin q4 hours to help with secretions, Scopolamine stopped--CXR more atelectasis> PNA-periodic x-ray--last 1 on 9/2 showed no specific changes no concerns-just monitor trends at this time -hypertonic saline neb BID for mucociliary clearance -Respiratory cultures 8/28 showed staph and felt to be likely colonized  Alcohol abuse Initially managed on CIWA--now completely resolved Continue thiamine, multivitamin and folic acid  Mixed hyperlipidemia -Continue Lipitor  Uncontrolled type 2 diabetes mellitus with hyperglycemia/hypoglycemia (HCC)A1C of 8.3%.= -Continue Levemir to 55 units daily and Novolog 10 units every 4 hours -CBG 1 20-1 90  Hypertension moderately controlled Previously with hypertensive emergency which has now resolved. -Continue amlodipine 10, hydrochlorothiazide 12.5 and Cor 37.5 twice daily  As needed  labetalol  NSVT (nonsustained ventricular tachycardia) (HCC) Patient with intermittent Vtach. Most recently with 25 beats of vtach on 8/29. QTc of  424 msec -Replete potassium and magnesium--periodic re-checks -Cardiology saw patient previously 8/2 recommended-- maintain mag >2 and k >4, no additional cardiac w/u  No further symptoms since--- stable on monitors  Dysphagia Patient is s/p PEG tube and is on tube feeds via PEG. -Continue tube feeds and free water 100 cc Repeat labs in the morning  Tracheostomy complication (HCC)-resolved as of 08/25/2021 In setting of DAPT. ENT consulted for revision. Bleeding resolved.  Small sacral decubitus prior to admission Has pressure injury sacrum and mild pressure injury on left thigh-these are all stable and reviewed last on 9/3     DVT prophylaxis: SCD Code Status: full Family Communication: discussed with daughter [name] bedside previously on 8/29 Called to discuss with her on 9/2 but did not get her  disposition:   Status is: Inpatient Remains inpatient appropriate because: no safe d/c plan--- awaiting LTAC reevaluation discomfort   Consultants:  PCCM Neurology ENT  Procedures:  echo  Antimicrobials:    Subjective:  Work of breathing better since yesterday seems comfortable Mouthing words overall unchanged  Objective: Vitals:   09/12/21 0500 09/12/21 0714 09/12/21 0735 09/12/21 0930  BP:  (!) 166/84  (!) 152/84  Pulse:  85 87 88  Resp:  (!) 33 (!) 30 (!) 30  Temp:  98.5 F (36.9 C)  98.4 F (36.9 C)  TempSrc:  Oral  Oral  SpO2:  97% 97% 97%  Weight: 84.8 kg     Height:        Intake/Output Summary (Last 24 hours) at 09/12/2021 1144 Last data filed at 09/11/2021 2315 Gross per 24 hour  Intake --  Output 900 ml  Net -900 ml    Filed Weights   09/08/21 0500 09/11/21 0437 09/12/21 0500  Weight: 80.5 kg 84 kg 84.8 kg    Examination:  Mouths words-coherent Chest anterolaterally is clear-cannot appreciate any  rales rhonchi posterolaterally increased WOB Abdomen is soft with PEG tube in place S1-S2 no murmur no rub no gallop Skin is intact he is overall slightly swollen Sacrum has area with stage I but healing has stage I which is clean  Data Reviewed: I have personally reviewed following labs and imaging studies  CBC: Recent Labs  Lab 09/06/21 0247 09/07/21 0155 09/12/21 0112  WBC 8.3 8.3 8.0  NEUTROABS 5.7 6.1 5.7  HGB 12.7* 12.3* 12.8*  HCT 38.1* 37.9* 38.5*  MCV 85.0 87.1 85.6  PLT 278 263 314     Basic Metabolic Panel: Recent Labs  Lab 09/06/21 0247 09/07/21 0155 09/08/21 1259 09/10/21 0352 09/12/21 0112  NA 137 133* 137 137 139  K 3.6 3.3* 4.4 3.5 3.4*  CL 99 97* 100 100 101  CO2 '28 26 26 27 28  '$ GLUCOSE 115* 157* 177* 115* 140*  BUN '17 15 16 16 17  '$ CREATININE 0.63 0.57* 0.65 0.59* 0.62  CALCIUM 8.8* 8.7* 9.4 8.9 9.0  MG 1.7 1.6* 1.8 1.6*  --   PHOS 4.2 3.7  --   --   --      GFR: Estimated Creatinine Clearance: 94.6 mL/min (by C-G formula based on SCr of 0.62 mg/dL).  Liver Function Tests: Recent Labs  Lab 09/06/21 0247 09/07/21 0155  AST 27 31  ALT 61* 64*  ALKPHOS 39 39  BILITOT 0.4 0.3  PROT 6.5 6.2*  ALBUMIN 2.5* 2.5*     CBG: Recent Labs  Lab 09/11/21 1944 09/11/21 2316 09/12/21 0325 09/12/21 0736 09/12/21 1123  GLUCAP 149* 170* 125* 194* 187*  Radiology Studies: DG CHEST PORT 1 VIEW  Result Date: 09/12/2021 CLINICAL DATA:  Pneumonia EXAM: PORTABLE CHEST 1 VIEW COMPARISON:  September 06, 2021 FINDINGS: Stable tracheostomy tube. No pneumothorax. Mild atelectasis in the bases. No focal infiltrates. The cardiomediastinal silhouette is stable. No pulmonary nodules or masses. IMPRESSION: Mild bibasilar opacities likely represent atelectasis. No other interval changes. Electronically Signed   By: Dorise Bullion III M.D.   On: 09/12/2021 08:43     Scheduled Meds:  amLODipine  10 mg Per Tube Daily   aspirin  81 mg Per Tube Daily    atorvastatin  80 mg Per Tube Daily   carvedilol  37.5 mg Per Tube BID WC   diclofenac Sodium  2 g Topical QID   feeding supplement (PROSource TF20)  60 mL Per Tube Daily   fiber supplement (BANATROL TF)  60 mL Per Tube BID   folic acid  1 mg Per Tube Daily   free water  100 mL Per Tube Q4H   guaiFENesin  10 mL Per Tube Q4H   hydrochlorothiazide  12.5 mg Per Tube Daily   insulin aspart  10 Units Subcutaneous Q4H   insulin detemir  55 Units Subcutaneous Daily   liver oil-zinc oxide   Topical TID   multivitamin with minerals  1 tablet Per Tube Daily   mouth rinse  15 mL Mouth Rinse Q2H   potassium chloride  40 mEq Per Tube Daily   thiamine  100 mg Per Tube Daily   ticagrelor  90 mg Per Tube BID   Continuous Infusions:  feeding supplement ([name]) 1,000 mL (09/12/21 0543)     LOS: 40 days    Time spent: 25  Nita Sells, MD [facility]   To contact the attending provider between 7A-7P or the covering provider during after hours 7P-7A, please log into the web site [email or web address] and access using universal  password for that web site. If you do not have the password, please call the hospital operator.  09/12/2021, 11:44 AM

## 2021-09-13 LAB — GLUCOSE, CAPILLARY
Glucose-Capillary: 141 mg/dL — ABNORMAL HIGH (ref 70–99)
Glucose-Capillary: 162 mg/dL — ABNORMAL HIGH (ref 70–99)
Glucose-Capillary: 163 mg/dL — ABNORMAL HIGH (ref 70–99)
Glucose-Capillary: 198 mg/dL — ABNORMAL HIGH (ref 70–99)
Glucose-Capillary: 145 mg/dL — ABNORMAL HIGH (ref 70–99)

## 2021-09-13 NOTE — Progress Notes (Signed)
PROGRESS NOTE  Joshua Donovan  AST:[id number] DOB: 25-Jun-1955 DOA: 08/03/2021 PCP: [facility] [place]  No chief complaint on file.  Brief Narrative:  Patient with history of ICH, type II DM, HTN, HLD presented to [facility] hospital on 7/24 with right hand numbness and weakness, initially found to have a small brainstem stroke and ventral medulla.  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  He was intubated and transferred to [facility] for interventional radiology intervention.  Remained in the intensive care unit.  Now with tracheostomy and PEG tube placement.  Pending LTAC transfer.  7/24 presented to [facility], ventral medulla CVA 7/25 tx to [facility], treated with Cleviprex. 7/26 cerebral angiogram with stent placement to right vertebro-basilar junction, failed extubation, MRI brain> mild extension of stroke, now involving bilateral medial medullary, patent basilar artery and R VBJ stent  7/28 bedside tracheostomy by PCCM and PEG tube placement by general surgery 7/30 Hypertension persist despite adding oral agents, glucose remains elevated as well 7/31 Bleeding around trach site, bright red blood.  Underwent bronchoscopy, Trach Removed, patient orally reintubated, stoma packed. 8/2 ENT took patient to OR for trach redo 8/9 transferred to medical floor on tracheostomy now. 8/12 vomiting after bolus feeding with worsening hypoxia. 8/13 copious bleeding from the heparin injection site controlled with pressure dressing. 8/14 bleeding has resolved.  Trach secretions blood-tinged. 8/15 insurance denied LTAC appeal.  CSW working on SNF. 8/16>8/22: 25 beat run of NSVT on 8/21 8/28: peer to peer for LTAC looks like this was denied- Interval x-ray showed basilar opacities--respiratory culture ordered, hypertonic saline started scopolamine discontinued 8/29: V. tach runs-increased metoprolol Resolved 9/2: Slightly increased work of  breathing-low-grade temps 99 for chest x-ray showed no interval changes   Assessment & Plan:   Principal Problem:   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[place]) Active Problems:   Acute respiratory failure with hypoxia ([place])   Hypertension   Uncontrolled type 2 diabetes mellitus with hyperglycemia (HCC)   Mixed hyperlipidemia   Alcohol abuse   NSVT (nonsustained ventricular tachycardia) ([place])   Dysphagia   Tracheostomy dependent (HCC)  Assessment and Plan: 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[place]) initially loaded with aspirin and Brilinta.  Cerebral angiogram performed on 7/26 with placement of a stent of right vertebro-basilar junction. 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 Concern for possible aspiration pneumonitis. Tracheostomy placed on 7/31 and revised by ENT on 8/2 secondary to bleeding-? Decanulation vs downsize per CCM when next seen PCCM for trach management--last seen 8/28 Guaifenesin q4 hours to help with secretions, Scopolamine stopped--CXR more atelectasis> PNA-periodic x-ray--last 1 on 9/2 showed no specific changes no concerns hypertonic saline neb BID for mucociliary clearance Respiratory cultures 8/28 showed staph and felt to be likely colonized  Alcohol abuse Initially managed on CIWA--now completely resolved Continue thiamine, multivitamin and folic acid  Mixed hyperlipidemia Continue Lipitor  Uncontrolled type 2 diabetes mellitus with hyperglycemia/hypoglycemia (HCC)A1C of 8.3%.= Continue Levemir to 55 units daily and Novolog 10 units every 4 hours CBG 1 20-1 90  Hypertension moderately controlled Previously with hypertensive emergency which has now resolved. Continue amlodipine 10, hydrochlorothiazide 12.5 and Coreg 37.5 twice daily  As needed labetalol  NSVT (nonsustained  ventricular tachycardia) (HCC) Patient with intermittent Vtach. Most recently with 25 beats of vtach on 8/29. QTc of 424 msec Replete potassium and magnesium--periodic re-checks Cardiology saw  patient previously 8/2 recommended-- maintain mag >2 and k >4, no additional cardiac w/u  No further symptoms-- stable on monitors  Dysphagia Patient is s/p PEG tube and is on tube feeds via PEG. Continue tube feeds and free water 100 cc Repeat labs in the morning  Tracheostomy complication (HCC)-resolved as of 08/25/2021 In setting of DAPT. ENT consulted for revision. Bleeding resolved.  Small sacral decubitus prior to admission Has pressure injury sacrum and mild pressure injury on left thigh-these are all stable and reviewed last on 9/3     DVT prophylaxis: SCD Code Status: full Family Communication: discussed with daughter [name] bedside previously on 8/29 Called to discuss with her on 9/2 but did not get her  disposition:   Status is: Inpatient Remains inpatient appropriate because: no safe d/c plan--- awaiting LTAC reevaluation discomfort   Consultants:  PCCM Neurology ENT  Procedures:  echo  Antimicrobials:   Subjective:  Some secretions and diaphoretic earlier today-no central temp elevation-now seems resolved after NT suction Otherwise no new issues  Objective: Vitals:   09/13/21 0700 09/13/21 0833 09/13/21 1116 09/13/21 1135  BP: (!) 143/84  (!) 192/97 (!) 160/93  Pulse: 91 90 86 83  Resp: (!) 30 (!) 28 (!) 30 (!) 33  Temp: 98.1 F (36.7 C)  98.2 F (36.8 C)   TempSrc: Axillary  Tympanic   SpO2: 99% 98% 99% 98%  Weight:      Height:        Intake/Output Summary (Last 24 hours) at 09/13/2021 1202 Last data filed at 09/13/2021 0606 Gross per 24 hour  Intake 3500 ml  Output 1450 ml  Net 2050 ml    Filed Weights   09/11/21 0437 09/12/21 0500 09/13/21 0500  Weight: 84 kg 84.8 kg 83.9 kg    Examination:  Diaphoretic--Mouths words-coherent--some thick  secretions Chest anterolaterally is clear Abdomen is soft with PEG tube in place S1-S2 no murmur no rub no gallop overall slightly swollen  Data Reviewed: I have personally reviewed following labs and imaging studies  CBC: Recent Labs  Lab 09/07/21 0155 09/12/21 0112  WBC 8.3 8.0  NEUTROABS 6.1 5.7  HGB 12.3* 12.8*  HCT 37.9* 38.5*  MCV 87.1 85.6  PLT 263 308    Basic Metabolic Panel: Recent Labs  Lab 09/07/21 0155 09/08/21 1259 09/10/21 0352 09/12/21 0112  NA 133* 137 137 139  K 3.3* 4.4 3.5 3.4*  CL 97* 100 100 101  CO2 '26 26 27 28  '$ GLUCOSE 157* 177* 115* 140*  BUN '15 16 16 17  '$ CREATININE 0.57* 0.65 0.59* 0.62  CALCIUM 8.7* 9.4 8.9 9.0  MG 1.6* 1.8 1.6*  --   PHOS 3.7  --   --   --     GFR: Estimated Creatinine Clearance: 94 mL/min (by C-G formula based on SCr of 0.62 mg/dL).  Liver Function Tests: Recent Labs  Lab 09/07/21 0155  AST 31  ALT 64*  ALKPHOS 39  BILITOT 0.3  PROT 6.2*  ALBUMIN 2.5*    CBG: Recent Labs  Lab 09/12/21 1927 09/12/21 2317 09/13/21 0341 09/13/21 0730 09/13/21 1159  GLUCAP 142* 142* 141* 162* 163*    Radiology Studies: DG CHEST PORT 1 VIEW  Result Date: 09/12/2021 CLINICAL DATA:  Pneumonia EXAM: PORTABLE CHEST 1 VIEW COMPARISON:  September 06, 2021 FINDINGS: Stable tracheostomy tube. No pneumothorax. Mild atelectasis in the bases. No focal infiltrates. The cardiomediastinal silhouette is stable. No pulmonary nodules or masses. IMPRESSION: Mild bibasilar opacities likely represent atelectasis. No  other interval changes. Electronically Signed   By: Dorise Bullion III M.D.   On: 09/12/2021 08:43     Scheduled Meds:  amLODipine  10 mg Per Tube Daily   aspirin  81 mg Per Tube Daily   atorvastatin  80 mg Per Tube Daily   carvedilol  37.5 mg Per Tube BID WC   diclofenac Sodium  2 g Topical QID   feeding supplement (PROSource TF20)  60 mL Per Tube Daily   fiber supplement (BANATROL TF)  60 mL Per Tube BID   folic acid  1 mg  Per Tube Daily   free water  100 mL Per Tube Q4H   guaiFENesin  10 mL Per Tube Q4H   hydrochlorothiazide  12.5 mg Per Tube Daily   insulin aspart  10 Units Subcutaneous Q4H   insulin detemir  55 Units Subcutaneous Daily   liver oil-zinc oxide   Topical TID   multivitamin with minerals  1 tablet Per Tube Daily   mouth rinse  15 mL Mouth Rinse Q2H   potassium chloride  40 mEq Per Tube Daily   thiamine  100 mg Per Tube Daily   ticagrelor  90 mg Per Tube BID   Continuous Infusions:  feeding supplement ([name]) 1,000 mL (09/13/21 0048)     LOS: 41 days    Time spent: 25  Nita Sells, MD [facility]   To contact the attending provider between 7A-7P or the covering provider during after hours 7P-7A, please log into the web site [email or web address] and access using universal [place] password for that web site. If you do not have the password, please call the hospital operator.  09/13/2021, 12:02 PM

## 2021-09-13 NOTE — Plan of Care (Signed)
Problem: Education: Goal: Knowledge of General Education information will improve Description: Including pain rating scale, medication(s)/side effects and non-pharmacologic comfort measures Outcome: Progressing   Problem: Health Behavior/Discharge Planning: Goal: Ability to manage health-related needs will improve Outcome: Progressing   Problem: Clinical Measurements: Goal: Ability to maintain clinical measurements within normal limits will improve Outcome: Progressing Goal: Will remain free from infection Outcome: Progressing Goal: Diagnostic test results will improve Outcome: Progressing Goal: Respiratory complications will improve Outcome: Progressing Goal: Cardiovascular complication will be avoided Outcome: Progressing   Problem: Activity: Goal: Risk for activity intolerance will decrease Outcome: Progressing   Problem: Nutrition: Goal: Adequate nutrition will be maintained Outcome: Progressing   Problem: Coping: Goal: Level of anxiety will decrease Outcome: Progressing   Problem: Elimination: Goal: Will not experience complications related to bowel motility Outcome: Progressing Goal: Will not experience complications related to urinary retention Outcome: Progressing   Problem: Pain Managment: Goal: General experience of comfort will improve Outcome: Progressing   Problem: Safety: Goal: Ability to remain free from injury will improve Outcome: Progressing   Problem: Skin Integrity: Goal: Risk for impaired skin integrity will decrease Outcome: Progressing   Problem: Education: Goal: Ability to describe self-care measures that may prevent or decrease complications (Diabetes Survival Skills Education) will improve Outcome: Progressing Goal: Individualized Educational Video(s) Outcome: Progressing   Problem: Coping: Goal: Ability to adjust to condition or change in health will improve Outcome: Progressing   Problem: Fluid Volume: Goal: Ability to  maintain a balanced intake and output will improve Outcome: Progressing   Problem: Health Behavior/Discharge Planning: Goal: Ability to identify and utilize available resources and services will improve Outcome: Progressing Goal: Ability to manage health-related needs will improve Outcome: Progressing   Problem: Metabolic: Goal: Ability to maintain appropriate glucose levels will improve Outcome: Progressing   Problem: Nutritional: Goal: Maintenance of adequate nutrition will improve Outcome: Progressing Goal: Progress toward achieving an optimal weight will improve Outcome: Progressing   Problem: Skin Integrity: Goal: Risk for impaired skin integrity will decrease Outcome: Progressing   Problem: Tissue Perfusion: Goal: Adequacy of tissue perfusion will improve Outcome: Progressing   Problem: Education: Goal: Knowledge of disease or condition will improve Outcome: Progressing Goal: Knowledge of secondary prevention will improve (SELECT ALL) Outcome: Progressing Goal: Knowledge of patient specific risk factors will improve (INDIVIDUALIZE FOR PATIENT) Outcome: Progressing Goal: Individualized Educational Video(s) Outcome: Progressing   Problem: Coping: Goal: Will verbalize positive feelings about self Outcome: Progressing Goal: Will identify appropriate support needs Outcome: Progressing   Problem: Health Behavior/Discharge Planning: Goal: Ability to manage health-related needs will improve Outcome: Progressing   Problem: Self-Care: Goal: Ability to participate in self-care as condition permits will improve Outcome: Progressing Goal: Verbalization of feelings and concerns over difficulty with self-care will improve Outcome: Progressing Goal: Ability to communicate needs accurately will improve Outcome: Progressing   Problem: Nutrition: Goal: Risk of aspiration will decrease Outcome: Progressing Goal: Dietary intake will improve Outcome: Progressing    Problem: Ischemic Stroke/TIA Tissue Perfusion: Goal: Complications of ischemic stroke/TIA will be minimized Outcome: Progressing   Problem: Education: Goal: Understanding of CV disease, CV risk reduction, and recovery process will improve Outcome: Progressing Goal: Individualized Educational Video(s) Outcome: Progressing   Problem: Activity: Goal: Ability to return to baseline activity level will improve Outcome: Progressing   Problem: Cardiovascular: Goal: Ability to achieve and maintain adequate cardiovascular perfusion will improve Outcome: Progressing Goal: Vascular access site(s) Level 0-1 will be maintained Outcome: Progressing   Problem: Health Behavior/Discharge Planning: Goal: Ability to safely manage   health-related needs after discharge will improve Outcome: Progressing   Problem: Activity: Goal: Ability to tolerate increased activity will improve Outcome: Progressing   

## 2021-09-14 DIAGNOSIS — J189 Pneumonia, unspecified organism: Secondary | ICD-10-CM

## 2021-09-14 DIAGNOSIS — Y95 Nosocomial condition: Secondary | ICD-10-CM

## 2021-09-14 LAB — GLUCOSE, CAPILLARY
Glucose-Capillary: 139 mg/dL — ABNORMAL HIGH (ref 70–99)
Glucose-Capillary: 144 mg/dL — ABNORMAL HIGH (ref 70–99)
Glucose-Capillary: 173 mg/dL — ABNORMAL HIGH (ref 70–99)
Glucose-Capillary: 185 mg/dL — ABNORMAL HIGH (ref 70–99)
Glucose-Capillary: 207 mg/dL — ABNORMAL HIGH (ref 70–99)
Glucose-Capillary: 209 mg/dL — ABNORMAL HIGH (ref 70–99)
Glucose-Capillary: 218 mg/dL — ABNORMAL HIGH (ref 70–99)

## 2021-09-14 LAB — BASIC METABOLIC PANEL
Anion gap: 9 (ref 5–15)
BUN: 16 mg/dL (ref 8–23)
CO2: 27 mmol/L (ref 22–32)
Calcium: 8.7 mg/dL — ABNORMAL LOW (ref 8.9–10.3)
Chloride: 98 mmol/L (ref 98–111)
Creatinine, Ser: 0.59 mg/dL — ABNORMAL LOW (ref 0.61–1.24)
GFR, Estimated: >60 mL/min (ref >60)
Glucose, Bld: 140 mg/dL — ABNORMAL HIGH (ref 70–99)
Potassium: 3.3 mmol/L — ABNORMAL LOW (ref 3.5–5.1)
Sodium: 134 mmol/L — ABNORMAL LOW (ref 135–145)

## 2021-09-14 LAB — CBC
HCT: 36.9 % — ABNORMAL LOW (ref 39.0–52.0)
Hemoglobin: 12.2 g/dL — ABNORMAL LOW (ref 13.0–17.0)
MCH: 28.2 pg (ref 26.0–34.0)
MCHC: 33.1 g/dL (ref 30.0–36.0)
MCV: 85.2 fL (ref 80.0–100.0)
Platelets: 318 10*3/uL (ref 150–400)
RBC: 4.33 MIL/uL (ref 4.22–5.81)
RDW: 13 % (ref 11.5–15.5)
WBC: 7.8 10*3/uL (ref 4.0–10.5)
nRBC: 0 % (ref 0.0–0.2)

## 2021-09-14 MED ORDER — POTASSIUM CHLORIDE 20 MEQ PO PACK
40.0000 meq | PACK | Freq: Two times a day (BID) | ORAL | Status: DC
  Administered 2021-09-14 – 2021-10-19 (×72): 40 meq
  Filled 2021-09-14 (×70): qty 2

## 2021-09-14 MED ORDER — CEFAZOLIN SODIUM-DEXTROSE 2-4 GM/100ML-% IV SOLN
2.0000 g | Freq: Three times a day (TID) | INTRAVENOUS | Status: AC
  Administered 2021-09-14 – 2021-09-21 (×21): 2 g via INTRAVENOUS
  Filled 2021-09-14 (×21): qty 100

## 2021-09-14 NOTE — Progress Notes (Signed)
OT Cancellation Note  Patient Details Name: Joshua Donovan MRN: [id number] DOB: 1955-06-07   Cancelled Treatment:    Reason Eval/Treat Not Completed: Patient declined, no reason specified (Pt politely declining, mothing "no" and "tomorrow." Plan session for tomorrow with PT and SLP to address goals)  Aldona Bar A Jasim Harari 09/14/2021, 3:34 PM

## 2021-09-14 NOTE — Progress Notes (Signed)
Pharmacy Antibiotic Note  Joshua Donovan is a 66 y.o. male admitted on 08/03/2021 with stroke now on day 42 of hospital admission.  Pharmacy has been consulted for cefazolin dosing for MSSA HAP.  Renal function is stable with CrCl >60 mL/min.  Plan: Start cefazolin 2g Q8H Monitor renal function for dose adjustments Anticipate 5-7 days course pending secretions  Height: '5\' 7"'$  (170.2 cm) Weight: 85.1 kg (187 lb 9.8 oz) IBW/kg (Calculated) : 66.1  Temp (24hrs), Avg:98.3 F (36.8 C), Min:98 F (36.7 C), Max:98.6 F (37 C)  Recent Labs  Lab 09/08/21 1259 09/10/21 0352 09/12/21 0112 09/14/21 0140  WBC  --   --  8.0 7.8  CREATININE 0.65 0.59* 0.62 0.59*    Estimated Creatinine Clearance: 94.7 mL/min (A) (by C-G formula based on SCr of 0.59 mg/dL (L)).    Allergies  Allergen Reactions   Lisinopril Anaphylaxis and Swelling    Angioedema (filled but not taking per family, 08/08/21).  TDD.   Anchovies [Fish Allergy] Swelling   Sulfa Antibiotics Swelling    Antimicrobials this admission: CTX 7/28 >> 7/30, 7/31 >> 8/2 Cefepime 7/30 >> 7/31, 8/3 >> 8/6 Cefazolin 9/5 >>  Microbiology results: 7/28 TA - GNR on Gram stain 7/28 TA bronch - AmpC Enterobacter aerogenes  8/28 TA: MSSA, Strep (beta-hemolytic)  Merrilee Jansky, PharmD Clinical Pharmacist 09/14/2021 1:56 PM

## 2021-09-14 NOTE — Progress Notes (Signed)
PROGRESS NOTE  Joshua Donovan  TIR:[id number] DOB: October 13, 1955 DOA: 08/03/2021 PCP: [facility] [place]  No chief complaint on file.  Brief Narrative:  Patient with history of ICH, type II DM, HTN, HLD presented to [facility] hospital on 7/24 with right hand numbness and weakness, initially found to have a small brainstem stroke and ventral medulla.  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  He was intubated and transferred to [facility] for interventional radiology intervention.  Remained in the intensive care unit.  Now with tracheostomy and PEG tube placement.  Pending LTAC transfer.  7/24 presented to [facility], ventral medulla CVA 7/25 tx to [facility], treated with Cleviprex. 7/26 cerebral angiogram with stent placement to right vertebro-basilar junction, failed extubation, MRI brain> mild extension of stroke, now involving bilateral medial medullary, patent basilar artery and R VBJ stent  7/28 bedside tracheostomy by PCCM and PEG tube placement by general surgery 7/30 Hypertension persist despite adding oral agents, glucose remains elevated as well 7/31 Bleeding around trach site, bright red blood.  Underwent bronchoscopy, Trach Removed, patient orally reintubated, stoma packed. 8/2 ENT took patient to OR for trach redo 8/9 transferred to medical floor on tracheostomy now. 8/12 vomiting after bolus feeding with worsening hypoxia. 8/13 copious bleeding from the heparin injection site controlled with pressure dressing. 8/14 bleeding has resolved.  Trach secretions blood-tinged. 8/15 insurance denied LTAC appeal.  CSW working on SNF. 8/16>8/22: 25 beat run of NSVT on 8/21 8/28: peer to peer for LTAC looks like this was denied- Interval x-ray showed basilar opacities--respiratory culture ordered, hypertonic saline started scopolamine discontinued 8/29: V. tach runs-increased metoprolol Resolved 9/2: Slightly increased work of  breathing-low-grade temps 99 for chest x-ray showed no interval changes   Assessment & Plan:   Principal Problem:   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[place]) Active Problems:   Acute respiratory failure with hypoxia ([place])   Hypertension   Uncontrolled type 2 diabetes mellitus with hyperglycemia (HCC)   Mixed hyperlipidemia   Alcohol abuse   NSVT (nonsustained ventricular tachycardia) ([place])   Dysphagia   Tracheostomy dependent (HCC)  Assessment and Plan: 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[place]) initially loaded with aspirin and Brilinta.  Cerebral angiogram performed on 7/26 with placement of a stent of right vertebro-basilar junction. 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 Concern for possible aspiration pneumonitis. Tracheostomy placed on 7/31 and revised by ENT on 8/2 secondary to bleeding-? Decanulation vs downsize per CCM when next seen PCCM for trach management--last seen 8/28 Guaifenesin q4 hours to help with secretions, Scopolamine stopped--CXR more atelectasis> PNA-periodic x-ray--last 1 on 9/2 showed no specific changes no concerns hypertonic saline neb BID for mucociliary clearance Respiratory cultures 8/28 showed staph --ancef started per pulmonary on 9/5  Alcohol abuse Initially managed on CIWA--now completely resolved Continue thiamine, multivitamin and folic acid  Mixed hyperlipidemia Continue Lipitor  Uncontrolled type 2 diabetes mellitus with hyperglycemia/hypoglycemia (HCC)A1C of 8.3%.= Continue Levemir to 55 units daily and Novolog 10 units every 4 hours CBG 139-218  Hypertension moderately controlled Previously with hypertensive emergency which has now resolved. Continue amlodipine 10, hydrochlorothiazide 12.5 and Coreg 37.5 twice daily  As needed labetalol  NSVT (nonsustained  ventricular tachycardia) (HCC) Patient with intermittent Vtach. Most recently with 25 beats of vtach on 8/29. QTc of 424 msec Replete potassium and magnesium--periodic re-checks Cardiology saw patient previously  8/2 recommended-- maintain mag >2 and k >4, no additional cardiac w/u  No further symptoms-- stable on monitors  Dysphagia Patient is s/p PEG tube and is on tube feeds via PEG. Continue tube feeds and free water 100 cc Repeat labs in the morning  Mild Hypokalemia Potassium was increased to 40 bid  Tracheostomy complication (HCC)-resolved as of 08/25/2021 In setting of DAPT. ENT consulted for revision. Bleeding resolved.  Small sacral decubitus prior to admission Has pressure injury sacrum and mild pressure injury on left thigh-these are all stable and reviewed last on 9/3     DVT prophylaxis: SCD Code Status: full Family Communication: discussed with daughter Forbes Cellar on phone 9/5  disposition:   Status is: Inpatient Remains inpatient appropriate because: no safe d/c plan--- awaiting LTAC reevaluation discomfort   Consultants:  PCCM Neurology ENT  Procedures:  echo  Antimicrobials:   Subjective:  Overall fair no issues Some hallucinations yesterday--but was given oxy around that time  Objective: Vitals:   09/14/21 0723 09/14/21 0908 09/14/21 1117 09/14/21 1130  BP: 132/80  (!) 153/90   Pulse: 82 94 86 87  Resp: (!) 25 (!) 31 (!) 28 (!) 28  Temp: 98.2 F (36.8 C)  98.5 F (36.9 C)   TempSrc: Oral  Oral   SpO2: 99% 99% 100% 100%  Weight:      Height:        Intake/Output Summary (Last 24 hours) at 09/14/2021 1453 Last data filed at 09/14/2021 1057 Gross per 24 hour  Intake 2500 ml  Output 1500 ml  Net 1000 ml    Filed Weights   09/12/21 0500 09/13/21 0500 09/14/21 0500  Weight: 84.8 kg 83.9 kg 85.1 kg    Examination:  Looks fair Chest anterolaterally is clear Abdomen is soft with PEG tube in place S1-S2 no murmur no rub no gallop overall  slightly swollen  Data Reviewed: I have personally reviewed following labs and imaging studies  CBC: Recent Labs  Lab 09/12/21 0112 09/14/21 0140  WBC 8.0 7.8  NEUTROABS 5.7  --   HGB 12.8* 12.2*  HCT 38.5* 36.9*  MCV 85.6 85.2  PLT 314 505    Basic Metabolic Panel: Recent Labs  Lab 09/08/21 1259 09/10/21 0352 09/12/21 0112 09/14/21 0140  NA 137 137 139 134*  K 4.4 3.5 3.4* 3.3*  CL 100 100 101 98  CO2 '26 27 28 27  '$ GLUCOSE 177* 115* 140* 140*  BUN '16 16 17 16  '$ CREATININE 0.65 0.59* 0.62 0.59*  CALCIUM 9.4 8.9 9.0 8.7*  MG 1.8 1.6*  --   --     GFR: Estimated Creatinine Clearance: 94.7 mL/min (A) (by C-G formula based on SCr of 0.59 mg/dL (L)).  Liver Function Tests: No results for input(s): "AST", "ALT", "ALKPHOS", "BILITOT", "PROT", "ALBUMIN" in the last 168 hours.  CBG: Recent Labs  Lab 09/13/21 1950 09/13/21 2333 09/14/21 0308 09/14/21 0813 09/14/21 1115  GLUCAP 145* 144* 139* 185* 218*    Radiology Studies: No results found.   Scheduled Meds:  amLODipine  10 mg Per Tube Daily   aspirin  81 mg Per Tube Daily   atorvastatin  80 mg Per Tube Daily   carvedilol  37.5 mg Per Tube BID WC   diclofenac Sodium  2 g Topical QID   feeding supplement (PROSource TF20)  60 mL Per Tube Daily   fiber supplement (BANATROL TF)  60 mL Per Tube BID   folic acid  1 mg Per Tube Daily  free water  100 mL Per Tube Q4H   guaiFENesin  10 mL Per Tube Q4H   hydrochlorothiazide  12.5 mg Per Tube Daily   insulin aspart  10 Units Subcutaneous Q4H   insulin detemir  55 Units Subcutaneous Daily   liver oil-zinc oxide   Topical TID   multivitamin with minerals  1 tablet Per Tube Daily   mouth rinse  15 mL Mouth Rinse Q2H   potassium chloride  40 mEq Per Tube BID   thiamine  100 mg Per Tube Daily   ticagrelor  90 mg Per Tube BID   Continuous Infusions:   ceFAZolin (ANCEF) IV     feeding supplement ([name]) 1,000 mL (09/13/21 0048)     LOS: 42 days     Time spent: 25  Nita Sells, MD [facility]   To contact the attending provider between 7A-7P or the covering provider during after hours 7P-7A, please log into the web site [email or web address] and access using universal [place] password for that web site. If you do not have the password, please call the hospital operator.  09/14/2021, 2:53 PM

## 2021-09-14 NOTE — Progress Notes (Signed)
PT Cancellation Note  Patient Details Name: Joshua Donovan MRN: [id number] DOB: 1955/10/15   Cancelled Treatment:    Reason Eval/Treat Not Completed: Patient declined, no reason specified; patient refused mobility and ROM today.  Agreed to wait till tomorrow since SLP wants to see pt in chair for working on communication device.    [name] 09/14/2021, 3:33 PM [name], PT Acute Rehabilitation Services Office:[phone] 09/14/2021

## 2021-09-14 NOTE — Progress Notes (Signed)
NAME:  Joshua Donovan, MRN:  [id number], DOB:  June 28, 1955, LOS: 67 ADMISSION DATE:  08/03/2021, CONSULTATION DATE:  7/26 REFERRING MD:  Colvin Caroli FOR CONSULT:  CVA   History of Present Illness:  66 y/o male presented to [facility] on 7/24 with R hand numbness and weakness, found to have small brainstem stroke in ventral medulla.  Symptoms worsened and required transfer to [facility] for neuro IR intervention where he had a stent placed in R vertebrovasilar junction.  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  He had a tracheostomy performed on 7/26 c/b dislodgement and ENT revision on 8/2.  Pertinent  Medical History  TBI with ICH, DM, HTN, HLD  Significant Hospital Events: Including procedures, antibiotic start and stop dates in addition to other pertinent events    7/24 presented to [facility], ventral medulla CVA 7/25 tx to [facility] 7/26 cerebral angiogram with stent placement to right vertebro-basilar junction, failed extubation, left radial aline MRI brain> mild extension of stroke, now involving bilateral medial medullary, patent basilar artery and R VBJ stent  7/28 underwent bedside percutaneous tracheostomy and PEG tube placement, tolerated well 7/29 no acute issues overnight currently on SBT trial this a.m. and tolerating well, Cleviprex resumed earlier this a.m. due to severe hypertension 7/30 Hypertension persist despite adding oral agents, glucose remains elevated as well 7/31 Bleeding around trach site, bright red blood. Copious bloody secretions. Cleviprex off, SBPs 150s-160s. Basal insulin/TF coverage increased. Cefepime deescalated to ceftriaxone. CXR stable. Later in afternoon, continued peritrach bleeding. Bronchoscopic eval completed with intratracheal bleeding associated with trach. Removed, patient orally reintubated, stoma packed. 8/1 ENT consulted for trach revision. Lightly sedated. Vent full support/PRVC, given fatigue following events of yesterday. ENT attempted to  evaluate trach at bedside, could not pass scope. Plan for OR 8/2. ASA/Brilinta held. 8/2 to OR for trach redo 8/6 trach collar during day shift, back on vent overnight 8/10 Hemoptysis, blood with suctioning > improved 8/11 8/14 on 8L / 35% ATC  8/28: Stable vitals, increased tenacious secretions from trach. CXR suggesting new LLL basilar opacities atelectasis vs pna 09/07/2021 Decreased volume and thickness of secretions. No fevers. Tracheal aspirate from 8/28 with mod gram positive cocci and few gram negative rods today, culture pending.  Interim History / Subjective:   Patient appears comfortable in bed, lying with eyes open. Able to understand and communicates by mouthing words, blinking to respond to YES/NO questions.   Slept better with less cough. Denies chills, sweats, chest pain, difficulty breathing. No pain from tracheostomy site.   Per nursing, patient with decreased and looser secretions compared to the past two days. They have also repositioned patient to be more upright to facilitate breathing and coughing efforts.  Objective   Blood pressure 132/80, pulse 82, temperature 98.2 F (36.8 C), temperature source Oral, resp. rate (!) 25, height '5\' 7"'$  (1.702 m), weight 85.1 kg, SpO2 99 %.    FiO2 (%):  [28 %] 28 %   Intake/Output Summary (Last 24 hours) at 09/14/2021 0834 Last data filed at 09/14/2021 0510 Gross per 24 hour  Intake 2100 ml  Output 1500 ml  Net 600 ml   Filed Weights   09/12/21 0500 09/13/21 0500 09/14/21 0500  Weight: 84.8 kg 83.9 kg 85.1 kg    Examination: General: 66 year old male in no acute distress HEENT: MM pink/moist tracheostomy in place unremarkable Neuro: Tracks and follows with eyes CV: Heart sounds regular PULM: Mild rhonchi   GI: soft, bsx4 active  Extremities:  warm/dry, 1+ edema  Skin: no rashes or lesions   Resolved Hospital Problem list   Enterobacter HCAP 7/28  Assessment & Plan:    Brainstem Stroke involving ventral medulla  with basilar artery stenosis s/p stent placement to R vertebro-basilar junction Acute Hypoxemic Respiratory Failure in setting of Stroke Tracheostomy Status: bleeding from initial tracheostomy, s/p  tracheostomy revision 8/2 by ENT, now with increased mucus secretions from site   Plan: Continue routine trach care Continue mucolytic Consider antimicrobial therapy if declines Pulmonary critical care will see weekly    Richardson Landry Johannah Rozas ACNP Acute Care Nurse Practitioner [place] Please consult Amion 09/14/2021, 8:34 AM

## 2021-09-14 NOTE — Progress Notes (Signed)
Speech Language Pathology Treatment: Cognitive-Linquistic  Patient Details Name: Caton Popowski MRN: [id number] DOB: [birth year] Today's Date: 09/14/2021 Time: 1050-1100 SLP Time Calculation (min) (ACUTE ONLY): 10 min  Assessment / Plan / Recommendation Clinical Impression  Brief session as pt reported he did not want to work on eye gaze device at this time. Could not read lips for phrase pt saying. But pt said no to working on device and appeared to say later. Pt coughing up lots of secretions and trying to sleep. SLP did stay in room and worked on device setting. SLP unable to use eye gaze with current setting. Increased dwell time to max, positioned device level with my own head and pushed away at least 2 feet. Attempted a second trial and able to easily spell my name. Hopeful that current settings will be easier for Giordano and less frusterating. I continue to problem solve this device and I hope to catch Sharron on a good day, up in his chair and alert and eager to participate at least once this week. Discussed use of sip and puff with PT/OT. Pt has minimal airflow to upper airway with current trach (6 cuffed). Pt may have increased access to airflow to upper airway, even for some voice if he had a cuffless trach.   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 He  presented 08/02/21 with R foot pain and R-sided weakness. Transferred to [facility] 7/25. 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 S/p stent placement to the R vertebrobasilar junction 7/26, failed extubation. Trach and PEG placed 7/28. PMH: DM, GERD, TBI with prior ICH, HLD,  HTN      SLP Plan  Continue with current plan of care      Recommendations for follow up therapy are one component of a multi-disciplinary discharge planning process, led by the attending physician.  Recommendations may be updated based on patient status, additional functional criteria and insurance authorization.    Recommendations                   Plan: Continue with current plan of care           Jontez Redfield, [name]  09/14/2021, 11:14 AM

## 2021-09-14 NOTE — Plan of Care (Signed)
  Problem: Education: Goal: Knowledge of General Education information will improve Description Including pain rating scale, medication(s)/side effects and non-pharmacologic comfort measures Outcome: Progressing   

## 2021-09-15 DIAGNOSIS — J9501 Hemorrhage from tracheostomy stoma: Secondary | ICD-10-CM

## 2021-09-15 DIAGNOSIS — G8250 Quadriplegia, unspecified: Secondary | ICD-10-CM

## 2021-09-15 LAB — GLUCOSE, CAPILLARY
Glucose-Capillary: 160 mg/dL — ABNORMAL HIGH (ref 70–99)
Glucose-Capillary: 168 mg/dL — ABNORMAL HIGH (ref 70–99)
Glucose-Capillary: 197 mg/dL — ABNORMAL HIGH (ref 70–99)
Glucose-Capillary: 120 mg/dL — ABNORMAL HIGH (ref 70–99)
Glucose-Capillary: 144 mg/dL — ABNORMAL HIGH (ref 70–99)
Glucose-Capillary: 125 mg/dL — ABNORMAL HIGH (ref 70–99)

## 2021-09-15 LAB — BASIC METABOLIC PANEL
Anion gap: 10 (ref 5–15)
BUN: 16 mg/dL (ref 8–23)
CO2: 27 mmol/L (ref 22–32)
Calcium: 9.1 mg/dL (ref 8.9–10.3)
Chloride: 100 mmol/L (ref 98–111)
Creatinine, Ser: 0.6 mg/dL — ABNORMAL LOW (ref 0.61–1.24)
GFR, Estimated: >60 mL/min (ref >60)
Glucose, Bld: 164 mg/dL — ABNORMAL HIGH (ref 70–99)
Potassium: 3.8 mmol/L (ref 3.5–5.1)
Sodium: 137 mmol/L (ref 135–145)

## 2021-09-15 LAB — CBC
HCT: 39.3 % (ref 39.0–52.0)
Hemoglobin: 13.2 g/dL (ref 13.0–17.0)
MCH: 28.3 pg (ref 26.0–34.0)
MCHC: 33.6 g/dL (ref 30.0–36.0)
MCV: 84.2 fL (ref 80.0–100.0)
Platelets: 339 10*3/uL (ref 150–400)
RBC: 4.67 MIL/uL (ref 4.22–5.81)
RDW: 13.1 % (ref 11.5–15.5)
WBC: 8.4 10*3/uL (ref 4.0–10.5)
nRBC: 0 % (ref 0.0–0.2)

## 2021-09-15 MED ORDER — INSULIN ASPART 100 UNIT/ML IJ SOLN: 0.0000 [IU] | Freq: Every day | INTRAMUSCULAR | Status: DC

## 2021-09-15 MED ORDER — INSULIN ASPART 100 UNIT/ML IJ SOLN: 0.0000 [IU] | Freq: Three times a day (TID) | INTRAMUSCULAR | Status: DC

## 2021-09-15 MED ORDER — INSULIN ASPART 100 UNIT/ML IJ SOLN
0.0000 [IU] | INTRAMUSCULAR | Status: DC
  Administered 2021-09-15 – 2021-09-16 (×3): 1 [IU] via SUBCUTANEOUS
  Administered 2021-09-16: 2 [IU] via SUBCUTANEOUS
  Administered 2021-09-16: 1 [IU] via SUBCUTANEOUS
  Administered 2021-09-16 (×2): 2 [IU] via SUBCUTANEOUS
  Administered 2021-09-17 (×2): 1 [IU] via SUBCUTANEOUS
  Administered 2021-09-17: 2 [IU] via SUBCUTANEOUS
  Administered 2021-09-17 – 2021-09-18 (×2): 1 [IU] via SUBCUTANEOUS
  Administered 2021-09-18 (×2): 2 [IU] via SUBCUTANEOUS
  Administered 2021-09-19 (×5): 1 [IU] via SUBCUTANEOUS
  Administered 2021-09-19: 2 [IU] via SUBCUTANEOUS
  Administered 2021-09-20: 1 [IU] via SUBCUTANEOUS
  Administered 2021-09-20 (×3): 2 [IU] via SUBCUTANEOUS
  Administered 2021-09-21: 1 [IU] via SUBCUTANEOUS
  Administered 2021-09-21 (×4): 2 [IU] via SUBCUTANEOUS
  Administered 2021-09-21 – 2021-09-22 (×4): 1 [IU] via SUBCUTANEOUS
  Administered 2021-09-22 (×2): 2 [IU] via SUBCUTANEOUS
  Administered 2021-09-22 (×2): 1 [IU] via SUBCUTANEOUS
  Administered 2021-09-23: 2 [IU] via SUBCUTANEOUS
  Administered 2021-09-23: 1 [IU] via SUBCUTANEOUS
  Administered 2021-09-23: 2 [IU] via SUBCUTANEOUS
  Administered 2021-09-23: 1 [IU] via SUBCUTANEOUS
  Administered 2021-09-24: 2 [IU] via SUBCUTANEOUS
  Administered 2021-09-24: 1 [IU] via SUBCUTANEOUS
  Administered 2021-09-24: 2 [IU] via SUBCUTANEOUS
  Administered 2021-09-24: 1 [IU] via SUBCUTANEOUS
  Administered 2021-09-24: 2 [IU] via SUBCUTANEOUS
  Administered 2021-09-24: 1 [IU] via SUBCUTANEOUS
  Administered 2021-09-25 (×2): 2 [IU] via SUBCUTANEOUS
  Administered 2021-09-25 (×3): 1 [IU] via SUBCUTANEOUS
  Administered 2021-09-26: 3 [IU] via SUBCUTANEOUS
  Administered 2021-09-26: 2 [IU] via SUBCUTANEOUS
  Administered 2021-09-27: 1 [IU] via SUBCUTANEOUS
  Administered 2021-09-27: 2 [IU] via SUBCUTANEOUS
  Administered 2021-09-27 (×2): 1 [IU] via SUBCUTANEOUS
  Administered 2021-09-28: 2 [IU] via SUBCUTANEOUS
  Administered 2021-09-28 (×2): 1 [IU] via SUBCUTANEOUS
  Administered 2021-09-29 (×2): 2 [IU] via SUBCUTANEOUS
  Administered 2021-09-29: 1 [IU] via SUBCUTANEOUS
  Administered 2021-09-29: 2 [IU] via SUBCUTANEOUS
  Administered 2021-09-30: 1 [IU] via SUBCUTANEOUS
  Administered 2021-09-30 – 2021-10-01 (×3): 2 [IU] via SUBCUTANEOUS
  Administered 2021-10-01 – 2021-10-02 (×4): 1 [IU] via SUBCUTANEOUS
  Administered 2021-10-02 (×2): 2 [IU] via SUBCUTANEOUS
  Administered 2021-10-02: 1 [IU] via SUBCUTANEOUS
  Administered 2021-10-03 (×2): 2 [IU] via SUBCUTANEOUS
  Administered 2021-10-03: 1 [IU] via SUBCUTANEOUS
  Administered 2021-10-04 (×3): 2 [IU] via SUBCUTANEOUS
  Administered 2021-10-04: 1 [IU] via SUBCUTANEOUS
  Administered 2021-10-05: 2 [IU] via SUBCUTANEOUS
  Administered 2021-10-05 – 2021-10-06 (×4): 1 [IU] via SUBCUTANEOUS
  Administered 2021-10-06 (×2): 2 [IU] via SUBCUTANEOUS
  Administered 2021-10-07 (×2): 1 [IU] via SUBCUTANEOUS
  Administered 2021-10-07: 2 [IU] via SUBCUTANEOUS
  Administered 2021-10-08: 1 [IU] via SUBCUTANEOUS
  Administered 2021-10-08: 2 [IU] via SUBCUTANEOUS
  Administered 2021-10-08: 1 [IU] via SUBCUTANEOUS
  Administered 2021-10-09 – 2021-10-10 (×6): 2 [IU] via SUBCUTANEOUS
  Administered 2021-10-11: 1 [IU] via SUBCUTANEOUS
  Administered 2021-10-11 (×2): 2 [IU] via SUBCUTANEOUS
  Administered 2021-10-11: 1 [IU] via SUBCUTANEOUS
  Administered 2021-10-11: 2 [IU] via SUBCUTANEOUS
  Administered 2021-10-12: 1 [IU] via SUBCUTANEOUS
  Administered 2021-10-12 (×2): 2 [IU] via SUBCUTANEOUS
  Administered 2021-10-13: 3 [IU] via SUBCUTANEOUS
  Administered 2021-10-13 (×2): 2 [IU] via SUBCUTANEOUS
  Administered 2021-10-14 (×3): 1 [IU] via SUBCUTANEOUS
  Administered 2021-10-14 – 2021-10-15 (×2): 3 [IU] via SUBCUTANEOUS
  Administered 2021-10-15 (×2): 2 [IU] via SUBCUTANEOUS
  Administered 2021-10-15: 3 [IU] via SUBCUTANEOUS
  Administered 2021-10-15: 1 [IU] via SUBCUTANEOUS
  Administered 2021-10-16: 2 [IU] via SUBCUTANEOUS
  Administered 2021-10-16 – 2021-10-17 (×3): 3 [IU] via SUBCUTANEOUS
  Administered 2021-10-17 (×2): 1 [IU] via SUBCUTANEOUS
  Administered 2021-10-18: 3 [IU] via SUBCUTANEOUS
  Administered 2021-10-18: 2 [IU] via SUBCUTANEOUS
  Administered 2021-10-18: 3 [IU] via SUBCUTANEOUS
  Administered 2021-10-18 – 2021-10-19 (×3): 2 [IU] via SUBCUTANEOUS
  Administered 2021-10-19 (×2): 1 [IU] via SUBCUTANEOUS
  Administered 2021-10-19 – 2021-10-20 (×4): 3 [IU] via SUBCUTANEOUS
  Administered 2021-10-20: 5 [IU] via SUBCUTANEOUS
  Administered 2021-10-20: 3 [IU] via SUBCUTANEOUS
  Administered 2021-10-21: 1 [IU] via SUBCUTANEOUS
  Administered 2021-10-21 (×2): 2 [IU] via SUBCUTANEOUS
  Administered 2021-10-21: 1 [IU] via SUBCUTANEOUS
  Administered 2021-10-21: 3 [IU] via SUBCUTANEOUS
  Administered 2021-10-22: 2 [IU] via SUBCUTANEOUS
  Administered 2021-10-22: 3 [IU] via SUBCUTANEOUS
  Administered 2021-10-22: 1 [IU] via SUBCUTANEOUS
  Administered 2021-10-22 (×2): 3 [IU] via SUBCUTANEOUS

## 2021-09-15 MED ORDER — INSULIN DETEMIR 100 UNIT/ML ~~LOC~~ SOLN
30.0000 [IU] | Freq: Two times a day (BID) | SUBCUTANEOUS | Status: DC
Start: 2021-09-16 — End: 2021-10-10
  Administered 2021-09-16 – 2021-10-09 (×48): 30 [IU] via SUBCUTANEOUS
  Filled 2021-09-15 (×51): qty 0.3

## 2021-09-15 NOTE — Plan of Care (Signed)
  Problem: Education: Goal: Knowledge of General Education information will improve Description Including pain rating scale, medication(s)/side effects and non-pharmacologic comfort measures Outcome: Progressing   Problem: Health Behavior/Discharge Planning: Goal: Ability to manage health-related needs will improve Outcome: Progressing   

## 2021-09-15 NOTE — Progress Notes (Signed)
Occupational Therapy Treatment Patient Details Name: Joshua Donovan MRN: [id number] DOB: March 24, 1955 Today's Date: 09/15/2021   History of present illness Pt is a 66 y.o. male who presented 08/02/21 with R foot pain and R-sided weakness. Transferred to [facility] 7/25. 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 S/p stent placement to the R vertebrobasilar junction 7/26, failed extubation. Trach and PEG placed 7/28. S/p bronchoscopic evaluation, oral reintubation and tracheostomy removal with stoma packing 7/31 due to trach site bleeding. S/p trach revision 8/2. PMH: DM, GERD, TBI with prior ICH, HLD, HTN   OT comments  Joshua Donovan was seen with PT for safety. He continues to require total A for all aspects of his care. He had increased secretions this date, coughing with any  movement and increased RR with sitting EOB. He tolerated BUE PROM supine, and cervical AAROM in sitting. He accurately mouthed yes/no throughout and he attempted to mouth sentences, but difficult to read lips. Bilat distal UEs have increased edema, placed pt's hand in elevated position while in the chair for edema control. OT to continue to follow, goals updated in care plan. POC is appropriate.    Recommendations for follow up therapy are one component of a multi-disciplinary discharge planning process, led by the attending physician.  Recommendations may be updated based on patient status, additional functional criteria and insurance authorization.    Follow Up Recommendations  OT at Long-term acute care hospital    Assistance Recommended at Discharge Frequent or constant  Supervision/Assistance  Patient can return home with the following  Two people to help with walking and/or transfers;Two people to help with bathing/dressing/bathroom;Assistance with cooking/housework;Assistance with feeding;Direct supervision/assist for medications management;Direct supervision/assist for financial management;Assist for transportation;Help with stairs or ramp for entrance   Equipment Recommendations  Other (comment)    Recommendations for Other Services      Precautions / Restrictions Precautions Precautions: Fall Precaution Comments: trach collar, peg, SBP < 180, prevalon boots, copious trach secretions, watch HR Restrictions Weight Bearing Restrictions: No       Mobility Bed Mobility Overal bed mobility: Needs Assistance Bed Mobility: Supine to Sit     Supine to sit: Total assist, +2 for physical assistance, HOB elevated Sit to supine: Total assist, +2 for physical assistance   General bed mobility comments: total A to transfer to sitting EOB for exercises    Transfers Overall transfer level: Needs assistance Equipment used: Ambulation equipment used Transfers: Bed to chair/wheelchair/BSC             General transfer comment: transferred to tilt in space wheelchair via lift; +2 for positioning Transfer via Lift Equipment: Maximove   Balance Overall balance assessment: Needs assistance Sitting-balance support: No upper extremity supported Sitting balance-Leahy Scale: Zero Sitting balance - Comments: total A for balance at EOB working on head control, but pt unable to maintain, coughing a lot needing rest break leaning back due to RR up to 40's.                                   ADL either performed or assessed with clinical judgement   ADL  Overall ADL's : Needs assistance/impaired                                     Functional mobility during ADLs: Total assistance;+2 for physical assistance General ADL Comments:  total A for all aspects    Extremity/Trunk Assessment Upper Extremity Assessment Upper Extremity Assessment: RUE deficits/detail;LUE deficits/detail RUE Deficits / Details: No AROM noted even for shoulder shrug.  PROM WNL intact with increased time for pain managment. incr edema RUE Coordination: decreased gross motor;decreased fine motor LUE Deficits / Details: No AROM noted even for shoulder shrug.  PROM WNL with increased time for pain mgmt. incr edema LUE Coordination: decreased fine motor;decreased gross motor   Lower Extremity Assessment Lower Extremity Assessment: Defer to PT evaluation        Vision   Vision Assessment?: Yes Eye Alignment: Within Functional Limits Ocular Range of Motion: Within Functional Limits Alignment/Gaze Preference: Gaze left Tracking/Visual Pursuits: Decreased smoothness of horizontal tracking;Decreased smoothness of eye movement to LEFT inferior field;Decreased smoothness of eye movement to RIGHT inferior field;Decreased smoothness of eye movement to LEFT superior field;Decreased smoothness of eye movement to RIGHT superior field;Impaired - to be further tested in functional context;Other (comment) Visual Fields: No apparent deficits Additional Comments: no nystagmus noted this date   Perception Perception Perception: Not tested   Praxis Praxis Praxis: Not tested    Cognition Arousal/Alertness: Awake/alert Behavior During Therapy: Flat affect Overall Cognitive Status: Difficult to assess Area of Impairment: Memory, Following commands                 Orientation Level: Person, Place, Time   Memory: Decreased short-term memory Following Commands: Follows one step commands consistently       General Comments: attempting to mouth full sentences despite education needing to respond with yes/no        Exercises Exercises: General Upper Extremity, Other exercises General Exercises - Upper Extremity Shoulder Flexion: PROM, Both, 10  reps, Seated Shoulder ABduction: PROM, Both, 10 reps, Supine Shoulder Horizontal ABduction: PROM, Both, 10 reps, Seated Shoulder Horizontal ADduction: PROM, Both, 10 reps, Seated Elbow Flexion: PROM, Both, 10 reps, Supine Elbow Extension: PROM, Both, 10 reps, Supine Wrist Flexion: PROM, Both, 10 reps, Supine Wrist Extension: PROM, Both, 10 reps, Supine Digit Composite Flexion: PROM, Both, 10 reps, Seated Composite Extension: PROM, Both, 10 reps, Seated Other Exercises Other Exercises: head control in sitting    Shoulder Instructions       General Comments trach collar '@28'$ %.  RR 20's-40's, HR 101 BP 142/90 sitting EOB    Pertinent Vitals/ Pain       Pain Assessment Pain Assessment: Faces Faces Pain Scale: Hurts little more Pain Location: grimacing with hand ROM Pain Descriptors / Indicators: Grimacing Pain Intervention(s): Limited activity within patient's tolerance, Monitored during session  Home Living                                          Prior Functioning/Environment              Frequency  Min 2X/week        Progress Toward Goals  OT Goals(current goals can now be found in the care plan section)  Progress towards OT goals: Progressing toward goals  Acute Rehab OT Goals Patient Stated Goal: not stated OT Goal  Formulation: Patient unable to participate in goal setting Time For Goal Achievement: 09/29/21 Potential to Achieve Goals: Fair ADL Goals Pt/caregiver will Perform Home Exercise Program: Both right and left upper extremity;With written HEP provided Additional ADL Goal #1: Pt's family will be educated about bed positioning to avoid pressure sores and increase care of skin Additional ADL Goal #2: Pt will use soft touch call bell to call nurses by moving head with min assist. Additional ADL Goal #3: Therapist to explore other options for call bell use if pt unable to move head to use soft touch system.  Plan Discharge plan remains  appropriate    Co-evaluation    PT/OT/SLP Co-Evaluation/Treatment: Yes Reason for Co-Treatment: Complexity of the patient's impairments (multi-system involvement);For patient/therapist safety;To address functional/ADL transfers PT goals addressed during session: Mobility/safety with mobility;Balance;Strengthening/ROM        AM-PAC OT "6 Clicks" Daily Activity     Outcome Measure   Help from another person eating meals?: Total Help from another person taking care of personal grooming?: Total Help from another person toileting, which includes using toliet, bedpan, or urinal?: Total Help from another person bathing (including washing, rinsing, drying)?: Total Help from another person to put on and taking off regular upper body clothing?: Total Help from another person to put on and taking off regular lower body clothing?: Total 6 Click Score: 6    End of Session Equipment Utilized During Treatment: Oxygen  OT Visit Diagnosis: Other symptoms and signs involving the nervous system (R29.898)   Activity Tolerance Patient tolerated treatment well   Patient Left in chair;with call bell/phone within reach;with chair alarm set   Nurse Communication Mobility status        Time: 3734-2876 OT Time Calculation (min): 45 min  Charges: OT General Charges $OT Visit: 1 Visit OT Treatments $Therapeutic Activity: 8-22 mins    Joshua Donovan 09/15/2021, 2:21 PM

## 2021-09-15 NOTE — TOC Progression Note (Signed)
Transition of Care [facility]) - Progression Note    Patient Details  Name: Joshua Donovan MRN: [id number] Date of Birth: 1955/11/15  Transition of Care [facility]) CM/SW [place], RN Phone Number:[phone]  09/15/2021, 1:03 PM  Clinical Narrative:    CM just received confirmation of denial for LTAC from [place] for [facility].   Expected Discharge Plan: Long Term Acute Care (LTAC) Barriers to Discharge: Continued Medical Work up  Expected Discharge Plan and Services Expected Discharge Plan: Long Term Acute Care (LTAC)   Discharge Planning Services: CM Consult Post Acute Care Choice: Long Term Acute Care (LTAC) Living arrangements for the past 2 months: Single Family Home                                       Social Determinants of Health (SDOH) Interventions    Readmission Risk Interventions     No data to display

## 2021-09-15 NOTE — Progress Notes (Signed)
Physical Therapy Treatment Patient Details Name: Joshua Donovan MRN: [id number] DOB: [birth year] Today's Date: 09/15/2021   History of Present Illness Pt is a 66 y.o. male who presented 08/02/21 with R foot pain and R-sided weakness. Transferred to [facility] 7/25. 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 S/p stent placement to the R vertebrobasilar junction 7/26, failed extubation. Trach and PEG placed 7/28. S/p bronchoscopic evaluation, oral reintubation and tracheostomy removal with stoma packing 7/31 due to trach site bleeding. S/p trach revision 8/2. PMH: DM, GERD, TBI with prior ICH, HLD, HTN    PT Comments    Patient seen with OT and focus to get pt upright to attempt communication device with SLP.  Provided total A up to EOB and working some on head control and upright tolerance, but pt with coughing spells and RR up to 40 at times needing reclined rest to recover.  Lifted to recliner and time spent for positioning for success with SLP.  Spoke by phone with pt's daughter, Renard Matter, to provide update on PT/OT sessions and how they can support him during visits.  PT will continue to follow acutely.  Continue to recommend LTACH at d/c.  Recommendations for follow up therapy are one component of a multi-disciplinary discharge planning process, led by the attending physician.  Recommendations may be updated based on patient status, additional functional criteria and insurance authorization.  Follow Up Recommendations  PT at Long-term acute care hospital Can patient physically be transported by private vehicle: No   Assistance Recommended at  Discharge Frequent or constant Supervision/Assistance  Patient can return home with the following Assistance with cooking/housework;Two people to help with walking and/or transfers;Two people to help with bathing/dressing/bathroom;Assistance with feeding;Direct supervision/assist for medications management;Direct supervision/assist for financial management;Assist for transportation;Help with stairs or ramp for entrance   Equipment Recommendations  Other (comment) (TBA)    Recommendations for Other Services       Precautions / Restrictions Precautions Precautions: Fall Precaution Comments: trach collar, peg, SBP < 180, prevalon boots, copious trach secretions, watch HR     Mobility  Bed Mobility Overal bed mobility: Needs Assistance Bed Mobility: Supine to Sit     Supine to sit: Total assist, +2 for physical assistance, HOB elevated          Transfers Overall transfer level: Needs assistance   Transfers: Bed to chair/wheelchair/BSC             General transfer comment: transferred to tilt in space wheelchair via lift; +2 for positioning Transfer via Lift Equipment: Maximove  Ambulation/Gait                   Stairs             Wheelchair Mobility    Modified Rankin (Stroke Patients Only) Modified Rankin (Stroke Patients Only) Pre-Morbid Rankin Score: No symptoms Modified Rankin: Severe disability     Balance Overall balance assessment: Needs assistance   Sitting balance-Leahy Scale: Zero Sitting balance - Comments: total A for balance at EOB working on head control, but pt unable to maintain, coughing a lot needing rest break leaning back due to RR up to 40's.  Cognition Arousal/Alertness: Awake/alert Behavior During Therapy: Flat affect Overall Cognitive Status: Difficult to assess                                 General Comments: attempting to mouth full sentences despite  education needing to respond with yes/no        Exercises Other Exercises Other Exercises: PROM UE's/LE's in supine    General Comments General comments (skin integrity, edema, etc.): trach collar '@28'$ %.  RR 20's-40's, HR 101 BP 142/90 sitting EOB      Pertinent Vitals/Pain Pain Assessment Pain Assessment: Faces Faces Pain Scale: Hurts little more Pain Location: grimacing with hand ROM Pain Descriptors / Indicators: Grimacing Pain Intervention(s): Monitored during session, Repositioned    Home Living                          Prior Function            PT Goals (current goals can now be found in the care plan section) Progress towards PT goals: Progressing toward goals (slowly)    Frequency    Min 3X/week      PT Plan Current plan remains appropriate    Co-evaluation PT/OT/SLP Co-Evaluation/Treatment: Yes Reason for Co-Treatment: Complexity of the patient's impairments (multi-system involvement);For patient/therapist safety;Necessary to address cognition/behavior during functional activity PT goals addressed during session: Mobility/safety with mobility;Balance;Strengthening/ROM        AM-PAC PT "6 Clicks" Mobility   Outcome Measure  Help needed turning from your back to your side while in a flat bed without using bedrails?: Total Help needed moving from lying on your back to sitting on the side of a flat bed without using bedrails?: Total Help needed moving to and from a bed to a chair (including a wheelchair)?: Total Help needed standing up from a chair using your arms (e.g., wheelchair or bedside chair)?: Total Help needed to walk in hospital room?: Total Help needed climbing 3-5 steps with a railing? 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

## 2021-09-15 NOTE — Progress Notes (Signed)
Speech Language Pathology Treatment: Cognitive-Linquistic  Patient Details Name: Joshua Donovan MRN: [id number] DOB: [birth year] Today's Date: 09/15/2021 Time: 2637-8588 SLP Time Calculation (min) (ACUTE ONLY): 30 min  Assessment / Plan / Recommendation Clinical Impression  Pt up in chair for brief session. SLP attempted varius position changes of device and Joshua Donovan's chair to maximize accuracy with selecting one button on command. Pt unable to select the button. Device consistently reading gaze above or below target. Pt only attempted this for a minute or two, but was barely opening eyes due to lethargy so session paused. A few minutes later, attempted again, cueing pt to select a single button on command. Pt successful in 3/6 attempts. Joshua Donovan also expressed some concern today that though Joshua Donovan is literate, he is not a strong reader. Spelling words, even with predictive text may be difficult based on the additional stress on his eyes and attention to complete the task. SLP took a moment to program some simple wants/needs phrases to try next session. Pt reported toileting needs so will f/u later this week.   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 He  presented 08/02/21 with R foot pain and R-sided weakness. Transferred to [facility] 7/25. 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 S/p stent placement to the R vertebrobasilar junction 7/26, failed extubation. Trach and PEG placed 7/28. PMH: DM, GERD, TBI with prior ICH, HLD, HTN      SLP Plan         Recommendations for follow up therapy are one component of a multi-disciplinary  discharge planning process, led by the attending physician.  Recommendations may be updated based on patient status, additional functional criteria and insurance authorization.    Recommendations                               Joshua Donovan, Joshua Donovan  09/15/2021, 1:19 PM

## 2021-09-15 NOTE — Progress Notes (Signed)
PROGRESS NOTE  Joshua Donovan EQA:[id number] DOB: August 18, 1955   PCP: [facility] [place]  Patient is from: Home  DOA: 08/03/2021 LOS: 56  Chief complaints No chief complaint on file.    Brief Narrative / Interim history: 66 year old M with PMH of ICH, DM-2, HTN, HLD, and EtOH abuse presented to [facility] on 7/24 with right hand numbness and weakness and found to have a small brainstem stroke in the ventral medulla.  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  He was intubated, started on Cleviprex and transferred to [facility] on 7/25.   7/26-stent to right ventral basilar junction.  Repeat MRI brain on 7/26 with mild extension of stroke involving bilateral medial medulla but patent basilar artery and right VBG stent.  Failed extubation.   7/28- bedside tracheostomy by PCCM and PEG tube by general surgery 7/31-developed bleeding around trach.  Bronchoscopy.  Trach removed.  Stoma packed.  Orally reintubated.  8/2-trach redo by ENT  8/9-transfer to medical floor 8/15-insurance denied LTAC appeal 8/21-25 beat run of NSVT 8/28-peer to peer review for LTAC unsuccessful 8/29-V. tach runs.  Coreg increased. 9/2-slightly increased work of breathing, low-grade temp and CXR without interval change  PCCM following for trach.   Subjective: Seen and examined earlier this morning.  No major events overnight of this morning.  No complaints but he is not able to verbalize.  He nods no to pain.  Follows command but not able to move extremities due to quadriplegia.  Able to smile and close eyes when asked.  No facial asymmetry.  Objective: Vitals:   09/15/21 0717 09/15/21 0835 09/15/21 1110 09/15/21 1228  BP: (!) 140/85 (!) 140/85 129/87 129/87  Pulse: 92 92 90 91  Resp: (!) 30 (!) 34 (!) 29 (!) 27  Temp: 98.2 F (36.8 C)  98 F (36.7 C)   TempSrc: Oral  Oral   SpO2: 99% 99% 96% 96%  Weight:      Height:        Examination:  GENERAL: No apparent distress.   Nontoxic. HEENT: MMM.  Vision and hearing grossly intact.  PERRL. NECK: Tracheostomy in place. RESP:  No IWOB.  Fair aeration bilaterally. CVS:  RRR. Heart sounds normal.  ABD/GI/GU: BS+. Abd soft, NTND.  G-tube in place. MSK/EXT: No able to move extremities.  SKIN: no apparent skin lesion or wound NEURO: Awake awake and alert.  Follows commands.  Quadriplegia. PSYCH: Calm. Normal affect.    Microbiology summarized: 7/26-surgical PCR with Staph aureus.  MRSA negative. 7/28-respiratory culture with Enterobacter aerogenes 8/28-respiratory culture with staph auris  Assessment and plan: Principal Problem:   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HCC) Active Problems:   Acute respiratory failure with hypoxia (HCC)   Hypertension   Uncontrolled type 2 diabetes mellitus with hyperglycemia (HCC)   Mixed hyperlipidemia   Alcohol abuse   NSVT (nonsustained ventricular tachycardia) (HCC)   Dysphagia   Tracheostomy dependent (HCC)  Acute stroke of medulla oblongata: Initially loaded with aspirin and Brilinta.  Stent to right vertebrobasilar junction on 7/26.  Still with dense quadriplegia -Continue aspirin, Brilinta and Lipitor -Neurology signed off on 8/4. -Continue PT/OT/SLP  Acute respiratory failure with hypoxia due to aspiration pneumonia/hospital-acquired pneumonia.  CXR on 9/3 shows bibasilar atelectasis/infiltrate.  Concern about Enterobacter/MSSA HAP -Tracheostomy since 7/31. -PCCM managing-started Ancef on and hypertonic saline nebs and chest physiotherapy by PCCM on 9/5 -Continue scopolamine patch for secretion -Trach care per respiratory and PCCM.  PCCM following weekly. -Pulmonary toilet  Alcohol abuse: Should be outside withdrawal window. -continue thiamine, multivitamin and folic acid   Uncontrolled DM-2 with hyperglycemia, hyperglycemia and hyperlipidemia: A1c 8.3% on 7/25.  Recent Labs  Lab 09/14/21 1943 09/14/21 2314 09/15/21 0315 09/15/21 0801 09/15/21 1154   GLUCAP 207* 209* 160* 168* 197*  -Change Levemir from 55 units daily to 30 units twice daily -Continue NovoLog 10 units every 4 hours -Continue statin. -Further adjustment as appropriate   Essential hypertension: Normotensive. -Continue amlodipine 10, hydrochlorothiazide 12.5 and Coreg 37.5 twice daily  -IV labetalol as needed   NSVT: -Coreg increased on 9/2. -Optimize Mg and K. -Cardiology saw patient previously on 8/2   Oropharyngeal dysphagia -Continue tube feed and free water via PEG. -SLP following   Hypokalemia: Resolved.   Tracheostomy complication/bleeding: In the setting of DAPT.  Resolved. -Tracheostomy revised by ENT   Small sacral decubitus prior to admission -Continue monitoring   Body mass index is 29.38 kg/m.  Inadequate oral intake/oropharyngeal dysphagia Nutrition Problem: Inadequate oral intake Etiology: acute illness Signs/Symptoms: NPO status Interventions: Refer to RD note for recommendations, Tube feeding   DVT prophylaxis:  Place TED hose Start: 08/22/21 1546 SCD's Start: 08/03/21 2311  Code Status: Full code Family Communication: Attempted to call patient's daughter over the phone but no answer. Level of care: Progressive Status is: Inpatient Remains inpatient appropriate because: Lack of safe disposition   Final disposition: SNF? Consultants:  Neurology-signed off ENT IR PCCM-following weekly  Sch Meds:  Scheduled Meds:  amLODipine  10 mg Per Tube Daily   aspirin  81 mg Per Tube Daily   atorvastatin  80 mg Per Tube Daily   carvedilol  37.5 mg Per Tube BID WC   diclofenac Sodium  2 g Topical QID   feeding supplement (PROSource TF20)  60 mL Per Tube Daily   fiber supplement (BANATROL TF)  60 mL Per Tube BID   folic acid  1 mg Per Tube Daily   free water  100 mL Per Tube Q4H   guaiFENesin  10 mL Per Tube Q4H   hydrochlorothiazide  12.5 mg Per Tube Daily   insulin aspart  10 Units Subcutaneous Q4H   [START ON 09/16/2021]  insulin detemir  30 Units Subcutaneous BID   liver oil-zinc oxide   Topical TID   multivitamin with minerals  1 tablet Per Tube Daily   mouth rinse  15 mL Mouth Rinse Q2H   potassium chloride  40 mEq Per Tube BID   thiamine  100 mg Per Tube Daily   ticagrelor  90 mg Per Tube BID   Continuous Infusions:   ceFAZolin (ANCEF) IV 2 g (09/15/21 0516)   feeding supplement ([name]) 1,000 mL (09/14/21 1637)   PRN Meds:.acetaminophen **OR** acetaminophen (TYLENOL) oral liquid 160 mg/5 mL **OR** acetaminophen, bisacodyl, hydrALAZINE, labetalol, mouth rinse, senna-docusate  Antimicrobials: Anti-infectives (From admission, onward)    Start     Dose/Rate Route Frequency Ordered Stop   09/14/21 1445  ceFAZolin (ANCEF) IVPB 2g/100 mL premix        2 g 200 mL/hr over 30 Minutes Intravenous Every 8 hours 09/14/21 1356     08/11/21 1400  ceFEPIme (MAXIPIME) 2 g in sodium chloride 0.9 % 100 mL IVPB        2 g 200 mL/hr over 30 Minutes Intravenous Every 8 hours 08/11/21 0957 08/16/21 0633   08/09/21 1300  cefTRIAXone (ROCEPHIN) 1 g in sodium chloride 0.9 % 100 mL IVPB  Status:  Discontinued  1 g 200 mL/hr over 30 Minutes Intravenous Every 24 hours 08/09/21 1151 08/11/21 0957   08/08/21 1200  ceFEPIme (MAXIPIME) 2 g in sodium chloride 0.9 % 100 mL IVPB  Status:  Discontinued        2 g 200 mL/hr over 30 Minutes Intravenous Every 8 hours 08/08/21 1104 08/09/21 1151   08/06/21 1500  cefTRIAXone (ROCEPHIN) 2 g in sodium chloride 0.9 % 100 mL IVPB  Status:  Discontinued        2 g 200 mL/hr over 30 Minutes Intravenous Every 24 hours 08/06/21 1421 08/08/21 1104   08/04/21 1006  ceFAZolin (ANCEF) 2-4 GM/100ML-% IVPB       Note to Pharmacy: Margaretmary Dys E: cabinet override      08/04/21 1006 08/04/21 2214        I have personally reviewed the following labs and images: CBC: Recent Labs  Lab 09/12/21 0112 09/14/21 0140 09/15/21 0256  WBC 8.0 7.8 8.4  NEUTROABS 5.7  --   --    HGB 12.8* 12.2* 13.2  HCT 38.5* 36.9* 39.3  MCV 85.6 85.2 84.2  PLT 314 318 339   BMP &GFR Recent Labs  Lab 09/08/21 1259 09/10/21 0352 09/12/21 0112 09/14/21 0140 09/15/21 0256  NA 137 137 139 134* 137  K 4.4 3.5 3.4* 3.3* 3.8  CL 100 100 101 98 100  CO2 '26 27 28 27 27  '$ GLUCOSE 177* 115* 140* 140* 164*  BUN '16 16 17 16 16  '$ CREATININE 0.65 0.59* 0.62 0.59* 0.60*  CALCIUM 9.4 8.9 9.0 8.7* 9.1  MG 1.8 1.6*  --   --   --    Estimated Creatinine Clearance: 94.7 mL/min (A) (by C-G formula based on SCr of 0.6 mg/dL (L)). Liver & Pancreas: No results for input(s): "AST", "ALT", "ALKPHOS", "BILITOT", "PROT", "ALBUMIN" in the last 168 hours. No results for input(s): "LIPASE", "AMYLASE" in the last 168 hours. No results for input(s): "AMMONIA" in the last 168 hours. Diabetic: No results for input(s): "HGBA1C" in the last 72 hours. Recent Labs  Lab 09/14/21 1943 09/14/21 2314 09/15/21 0315 09/15/21 0801 09/15/21 1154  GLUCAP 207* 209* 160* 168* 197*   Cardiac Enzymes: No results for input(s): "CKTOTAL", "CKMB", "CKMBINDEX", "TROPONINI" in the last 168 hours. No results for input(s): "PROBNP" in the last 8760 hours. Coagulation Profile: No results for input(s): "INR", "PROTIME" in the last 168 hours. Thyroid Function Tests: No results for input(s): "TSH", "T4TOTAL", "FREET4", "T3FREE", "THYROIDAB" in the last 72 hours. Lipid Profile: No results for input(s): "CHOL", "HDL", "LDLCALC", "TRIG", "CHOLHDL", "LDLDIRECT" in the last 72 hours. Anemia Panel: No results for input(s): "VITAMINB12", "FOLATE", "FERRITIN", "TIBC", "IRON", "RETICCTPCT" in the last 72 hours. Urine analysis:    Component Value Date/Time   COLORURINE RED (A) 08/06/2021 1811   APPEARANCEUR CLOUDY (A) 08/06/2021 1811   APPEARANCEUR Clear 01/22/2019 0000   LABSPEC 1.016 08/06/2021 1811   PHURINE 6.0 08/06/2021 1811   GLUCOSEU 50 (A) 08/06/2021 1811   HGBUR LARGE (A) 08/06/2021 1811   BILIRUBINUR NEGATIVE  08/06/2021 1811   BILIRUBINUR Negative 01/22/2019 0000   KETONESUR NEGATIVE 08/06/2021 1811   PROTEINUR 100 (A) 08/06/2021 1811   NITRITE NEGATIVE 08/06/2021 1811   LEUKOCYTESUR NEGATIVE 08/06/2021 1811   Sepsis Labs: Invalid input(s): "PROCALCITONIN", "LACTICIDVEN"  Microbiology: Recent Results (from the past 240 hour(s))  Culture, Respiratory w Gram Stain     Status: None   Collection Time: 09/06/21  7:21 PM   Specimen: Tracheal Aspirate; Respiratory  Result  Value Ref Range Status   Specimen Description TRACHEAL ASPIRATE  Final   Special Requests NONE  Final   Gram Stain   Final    FEW WBC PRESENT, PREDOMINANTLY PMN MODERATE GRAM POSITIVE COCCI FEW GRAM NEGATIVE RODS RARE GRAM POSITIVE RODS    Culture   Final    MODERATE STAPHYLOCOCCUS AUREUS MODERATE STREPTOCOCCUS,BETA HEMOLYTIC NOT GROUP A Beta hemolytic streptococci are predictably susceptible to penicillin and other beta lactams. Susceptibility testing not routinely performed. Performed at [facility], [place] [address] [place], [place] [address]    Report Status 09/10/2021 FINAL  Final   Organism ID, Bacteria STAPHYLOCOCCUS AUREUS  Final      Susceptibility   Staphylococcus aureus - MIC*    CIPROFLOXACIN >=8 RESISTANT Resistant     ERYTHROMYCIN >=8 RESISTANT Resistant     GENTAMICIN <=0.5 SENSITIVE Sensitive     OXACILLIN 0.5 SENSITIVE Sensitive     TETRACYCLINE <=1 SENSITIVE Sensitive     VANCOMYCIN <=0.5 SENSITIVE Sensitive     TRIMETH/SULFA <=10 SENSITIVE Sensitive     CLINDAMYCIN <=0.25 SENSITIVE Sensitive     RIFAMPIN <=0.5 SENSITIVE Sensitive     Inducible Clindamycin NEGATIVE Sensitive     * MODERATE STAPHYLOCOCCUS AUREUS    Radiology Studies: No results found.    Geovanie Winnett T. [place]  If 7PM-7AM, please contact night-coverage [email or web address] 09/15/2021, 12:52 PM

## 2021-09-16 LAB — GLUCOSE, CAPILLARY
Glucose-Capillary: 113 mg/dL — ABNORMAL HIGH (ref 70–99)
Glucose-Capillary: 194 mg/dL — ABNORMAL HIGH (ref 70–99)
Glucose-Capillary: 188 mg/dL — ABNORMAL HIGH (ref 70–99)
Glucose-Capillary: 144 mg/dL — ABNORMAL HIGH (ref 70–99)
Glucose-Capillary: 141 mg/dL — ABNORMAL HIGH (ref 70–99)
Glucose-Capillary: 172 mg/dL — ABNORMAL HIGH (ref 70–99)

## 2021-09-16 NOTE — Progress Notes (Addendum)
PROGRESS NOTE  Joshua Donovan BWL:[id number] DOB: 1955-07-25   PCP: [facility] [place]  Patient is from: Home  DOA: 08/03/2021 LOS: 47  Chief complaints No chief complaint on file.    Brief Narrative / Interim history: 66 year old M with PMH of ICH, DM-2, HTN, HLD, and EtOH abuse presented to [facility] on 7/24 with right hand numbness and weakness and found to have a small brainstem stroke in the ventral medulla.  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  He was intubated, started on Cleviprex and transferred to [facility] on 7/25.   7/26-stent to right ventral basilar junction.  Repeat MRI brain on 7/26 with mild extension of stroke involving bilateral medial medulla but patent basilar artery and right VBG stent.  Failed extubation.   7/28- bedside tracheostomy by PCCM and PEG tube by general surgery 7/31-developed bleeding around trach.  Bronchoscopy.  Trach removed.  Stoma packed.  Orally reintubated.  8/2-trach redo by ENT  8/9-transfer to medical floor 8/15-insurance denied LTAC appeal 8/21-25 beat run of NSVT 8/28-peer to peer review for LTAC unsuccessful 8/29-V. tach runs.  Coreg increased. 9/2-slightly increased work of breathing, low-grade temp and CXR without interval change  PCCM following for trach.   Subjective: Seen and examined earlier this morning.  No major events overnight of this morning.  Has no complaints.  He mumbles no to pain.   Objective: Vitals:   09/16/21 0830 09/16/21 0843 09/16/21 1228 09/16/21 1235  BP: 139/83 (!) 137/90  112/69  Pulse: 92 94 88 85  Resp: (!) 27 (!) 29 (!) 28 (!) 29  Temp:  99 F (37.2 C)  98.8 F (37.1 C)  TempSrc:  Oral  Oral  SpO2: 97% 97% 98% 97%  Weight:      Height:        Examination:  GENERAL: No apparent distress.  Nontoxic. HEENT: MMM.  Vision and hearing grossly intact.  PERRL. NECK: Tracheostomy in place. RESP:  No IWOB.  Fair aeration bilaterally. CVS:  RRR. Heart sounds  normal.  ABD/GI/GU: BS+. Abd soft, NTND.  G-tube in place. MSK/EXT:  Moves extremities. No apparent deformity. No edema.  SKIN: no apparent skin lesion or wound NEURO: Awake and alert.  Follows commands.  Quadriplegic. PSYCH: Calm. Normal affect.    Microbiology summarized: 7/26-surgical PCR with Staph aureus.  MRSA negative. 7/28-respiratory culture with Enterobacter aerogenes 8/28-respiratory culture with staph auris  Assessment and plan: Principal Problem:   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HCC) Active Problems:   Acute respiratory failure with hypoxia (HCC)   Hypertension   Uncontrolled type 2 diabetes mellitus with hyperglycemia (HCC)   Mixed hyperlipidemia   Alcohol abuse   NSVT (nonsustained ventricular tachycardia) (HCC)   Dysphagia   Tracheostomy dependent (HCC)  Acute stroke of medulla oblongata: Initially loaded with aspirin and Brilinta.  Stent to right vertebrobasilar junction on 7/26.  Still with dense quadriplegia -Continue aspirin, Brilinta and Lipitor -Neurology signed off on 8/4. -Continue PT/OT/SLP  Acute respiratory failure with hypoxia due to aspiration pneumonia/hospital-acquired pneumonia.  CXR on 9/3 shows bibasilar atelectasis/infiltrate.  Concern about Enterobacter/MSSA HAP -Tracheostomy since 7/31. -PCCM managing-started Ancef on and hypertonic saline nebs and chest physiotherapy by PCCM on 9/5 -Continue scopolamine patch for secretion -Trach care per respiratory and PCCM.  PCCM following weekly. -Pulmonary toilet   Alcohol abuse: Should be outside withdrawal window. -continue thiamine, multivitamin and folic acid   Uncontrolled DM-2 with hyperglycemia, hyperglycemia and hyperlipidemia: A1c 8.3% on 7/25.  Recent Labs  Lab 09/15/21 1919 09/15/21 2319 09/16/21 0358 09/16/21 0842 09/16/21 1235  GLUCAP 144* 125* 113* 194* 188*  -Change Levemir from 55 units daily to 30 units twice daily -Continue NovoLog 10 units every 4 hours -Added SSI  every 4 hours -Continue statin. -Further adjustment as appropriate   Essential hypertension: Normotensive. -Continue amlodipine 10, hydrochlorothiazide 12.5 and Coreg 37.5 twice daily  -IV labetalol as needed   NSVT: -Coreg increased on 9/2. -Optimize Mg and K. -Cardiology saw patient previously on 8/2   Oropharyngeal dysphagia -Continue tube feed and free water via PEG. -SLP following   Hypokalemia: Resolved.   Tracheostomy complication/bleeding: In the setting of DAPT.  Resolved. -Tracheostomy revised by ENT   Small sacral decubitus prior to admission -Continue monitoring  Body mass index is 29.45 kg/m.  Inadequate oral intake/oropharyngeal dysphagia Nutrition Problem: Inadequate oral intake Etiology: acute illness Signs/Symptoms: NPO status Interventions: Refer to RD note for recommendations, Tube feeding   DVT prophylaxis:  Place TED hose Start: 08/22/21 1546 SCD's Start: 08/03/21 2311  Code Status: Full code Family Communication: Attempted to call patient's daughter, Renard Matter on 9/6 and 9/7 but no answer. Updated his other daughter, Joelene Millin over the phone.  Level of care: Progressive Status is: Inpatient Remains inpatient appropriate because: Lack of safe disposition   Final disposition: SNF.  Insurance denied LTAC. Consultants:  Neurology-signed off ENT IR PCCM-following weekly  Sch Meds:  Scheduled Meds:  amLODipine  10 mg Per Tube Daily   aspirin  81 mg Per Tube Daily   atorvastatin  80 mg Per Tube Daily   carvedilol  37.5 mg Per Tube BID WC   diclofenac Sodium  2 g Topical QID   feeding supplement (PROSource TF20)  60 mL Per Tube Daily   fiber supplement (BANATROL TF)  60 mL Per Tube BID   folic acid  1 mg Per Tube Daily   free water  100 mL Per Tube Q4H   guaiFENesin  10 mL Per Tube Q4H   hydrochlorothiazide  12.5 mg Per Tube Daily   insulin aspart  0-9 Units Subcutaneous Q4H   insulin aspart  10 Units Subcutaneous Q4H   insulin detemir  30  Units Subcutaneous BID   liver oil-zinc oxide   Topical TID   multivitamin with minerals  1 tablet Per Tube Daily   mouth rinse  15 mL Mouth Rinse Q2H   potassium chloride  40 mEq Per Tube BID   thiamine  100 mg Per Tube Daily   ticagrelor  90 mg Per Tube BID   Continuous Infusions:   ceFAZolin (ANCEF) IV Stopped (09/16/21 0539)   feeding supplement ([name]) 1,000 mL (09/14/21 1637)   PRN Meds:.acetaminophen **OR** acetaminophen (TYLENOL) oral liquid 160 mg/5 mL **OR** acetaminophen, bisacodyl, hydrALAZINE, labetalol, mouth rinse, senna-docusate  Antimicrobials: Anti-infectives (From admission, onward)    Start     Dose/Rate Route Frequency Ordered Stop   09/14/21 1445  ceFAZolin (ANCEF) IVPB 2g/100 mL premix        2 g 200 mL/hr over 30 Minutes Intravenous Every 8 hours 09/14/21 1356     08/11/21 1400  ceFEPIme (MAXIPIME) 2 g in sodium chloride 0.9 % 100 mL IVPB        2 g 200 mL/hr over 30 Minutes Intravenous Every 8 hours 08/11/21 0957 08/16/21 0633   08/09/21 1300  cefTRIAXone (ROCEPHIN) 1 g in sodium chloride 0.9 % 100 mL IVPB  Status:  Discontinued        1 g  200 mL/hr over 30 Minutes Intravenous Every 24 hours 08/09/21 1151 08/11/21 0957   08/08/21 1200  ceFEPIme (MAXIPIME) 2 g in sodium chloride 0.9 % 100 mL IVPB  Status:  Discontinued        2 g 200 mL/hr over 30 Minutes Intravenous Every 8 hours 08/08/21 1104 08/09/21 1151   08/06/21 1500  cefTRIAXone (ROCEPHIN) 2 g in sodium chloride 0.9 % 100 mL IVPB  Status:  Discontinued        2 g 200 mL/hr over 30 Minutes Intravenous Every 24 hours 08/06/21 1421 08/08/21 1104   08/04/21 1006  ceFAZolin (ANCEF) 2-4 GM/100ML-% IVPB       Note to Pharmacy: Margaretmary Dys E: cabinet override      08/04/21 1006 08/04/21 2214        I have personally reviewed the following labs and images: CBC: Recent Labs  Lab 09/12/21 0112 09/14/21 0140 09/15/21 0256  WBC 8.0 7.8 8.4  NEUTROABS 5.7  --   --   HGB 12.8* 12.2*  13.2  HCT 38.5* 36.9* 39.3  MCV 85.6 85.2 84.2  PLT 314 318 339   BMP &GFR Recent Labs  Lab 09/10/21 0352 09/12/21 0112 09/14/21 0140 09/15/21 0256  NA 137 139 134* 137  K 3.5 3.4* 3.3* 3.8  CL 100 101 98 100  CO2 '27 28 27 27  '$ GLUCOSE 115* 140* 140* 164*  BUN '16 17 16 16  '$ CREATININE 0.59* 0.62 0.59* 0.60*  CALCIUM 8.9 9.0 8.7* 9.1  MG 1.6*  --   --   --    Estimated Creatinine Clearance: 94.8 mL/min (A) (by C-G formula based on SCr of 0.6 mg/dL (L)). Liver & Pancreas: No results for input(s): "AST", "ALT", "ALKPHOS", "BILITOT", "PROT", "ALBUMIN" in the last 168 hours. No results for input(s): "LIPASE", "AMYLASE" in the last 168 hours. No results for input(s): "AMMONIA" in the last 168 hours. Diabetic: No results for input(s): "HGBA1C" in the last 72 hours. Recent Labs  Lab 09/15/21 1919 09/15/21 2319 09/16/21 0358 09/16/21 0842 09/16/21 1235  GLUCAP 144* 125* 113* 194* 188*   Cardiac Enzymes: No results for input(s): "CKTOTAL", "CKMB", "CKMBINDEX", "TROPONINI" in the last 168 hours. No results for input(s): "PROBNP" in the last 8760 hours. Coagulation Profile: No results for input(s): "INR", "PROTIME" in the last 168 hours. Thyroid Function Tests: No results for input(s): "TSH", "T4TOTAL", "FREET4", "T3FREE", "THYROIDAB" in the last 72 hours. Lipid Profile: No results for input(s): "CHOL", "HDL", "LDLCALC", "TRIG", "CHOLHDL", "LDLDIRECT" in the last 72 hours. Anemia Panel: No results for input(s): "VITAMINB12", "FOLATE", "FERRITIN", "TIBC", "IRON", "RETICCTPCT" in the last 72 hours. Urine analysis:    Component Value Date/Time   COLORURINE RED (A) 08/06/2021 1811   APPEARANCEUR CLOUDY (A) 08/06/2021 1811   APPEARANCEUR Clear 01/22/2019 0000   LABSPEC 1.016 08/06/2021 1811   PHURINE 6.0 08/06/2021 1811   GLUCOSEU 50 (A) 08/06/2021 1811   HGBUR LARGE (A) 08/06/2021 1811   BILIRUBINUR NEGATIVE 08/06/2021 1811   BILIRUBINUR Negative 01/22/2019 0000   KETONESUR  NEGATIVE 08/06/2021 1811   PROTEINUR 100 (A) 08/06/2021 1811   NITRITE NEGATIVE 08/06/2021 1811   LEUKOCYTESUR NEGATIVE 08/06/2021 1811   Sepsis Labs: Invalid input(s): "PROCALCITONIN", "LACTICIDVEN"  Microbiology: Recent Results (from the past 240 hour(s))  Culture, Respiratory w Gram Stain     Status: None   Collection Time: 09/06/21  7:21 PM   Specimen: Tracheal Aspirate; Respiratory  Result Value Ref Range Status   Specimen Description TRACHEAL ASPIRATE  Final  Special Requests NONE  Final   Gram Stain   Final    FEW WBC PRESENT, PREDOMINANTLY PMN MODERATE GRAM POSITIVE COCCI FEW GRAM NEGATIVE RODS RARE GRAM POSITIVE RODS    Culture   Final    MODERATE STAPHYLOCOCCUS AUREUS MODERATE STREPTOCOCCUS,BETA HEMOLYTIC NOT GROUP A Beta hemolytic streptococci are predictably susceptible to penicillin and other beta lactams. Susceptibility testing not routinely performed. Performed at [facility], [place] [address] [place], [place] [address]    Report Status 09/10/2021 FINAL  Final   Organism ID, Bacteria STAPHYLOCOCCUS AUREUS  Final      Susceptibility   Staphylococcus aureus - MIC*    CIPROFLOXACIN >=8 RESISTANT Resistant     ERYTHROMYCIN >=8 RESISTANT Resistant     GENTAMICIN <=0.5 SENSITIVE Sensitive     OXACILLIN 0.5 SENSITIVE Sensitive     TETRACYCLINE <=1 SENSITIVE Sensitive     VANCOMYCIN <=0.5 SENSITIVE Sensitive     TRIMETH/SULFA <=10 SENSITIVE Sensitive     CLINDAMYCIN <=0.25 SENSITIVE Sensitive     RIFAMPIN <=0.5 SENSITIVE Sensitive     Inducible Clindamycin NEGATIVE Sensitive     * MODERATE STAPHYLOCOCCUS AUREUS    Radiology Studies: No results found.    Clois Montavon T. [place]  If 7PM-7AM, please contact night-coverage [email or web address] 09/16/2021, 12:46 PM

## 2021-09-16 NOTE — Plan of Care (Signed)
  Problem: Education: Goal: Knowledge of General Education information will improve Description: Including pain rating scale, medication(s)/side effects and non-pharmacologic comfort measures Outcome: Progressing   Problem: Clinical Measurements: Goal: Ability to maintain clinical measurements within normal limits will improve Outcome: Progressing Goal: Will remain free from infection Outcome: Progressing Goal: Diagnostic test results will improve Outcome: Progressing Goal: Respiratory complications will improve Outcome: Progressing Goal: Cardiovascular complication will be avoided Outcome: Progressing   Problem: Nutrition: Goal: Adequate nutrition will be maintained Outcome: Progressing   Problem: Elimination: Goal: Will not experience complications related to bowel motility Outcome: Progressing Goal: Will not experience complications related to urinary retention Outcome: Progressing   Problem: Safety: Goal: Ability to remain free from injury will improve Outcome: Progressing

## 2021-09-17 LAB — GLUCOSE, CAPILLARY
Glucose-Capillary: 108 mg/dL — ABNORMAL HIGH (ref 70–99)
Glucose-Capillary: 109 mg/dL — ABNORMAL HIGH (ref 70–99)
Glucose-Capillary: 121 mg/dL — ABNORMAL HIGH (ref 70–99)
Glucose-Capillary: 132 mg/dL — ABNORMAL HIGH (ref 70–99)
Glucose-Capillary: 133 mg/dL — ABNORMAL HIGH (ref 70–99)
Glucose-Capillary: 187 mg/dL — ABNORMAL HIGH (ref 70–99)

## 2021-09-17 MED ORDER — FUROSEMIDE 10 MG/ML IJ SOLN
20.0000 mg | Freq: Once | INTRAMUSCULAR | Status: AC
  Administered 2021-09-17: 20 mg via INTRAVENOUS
  Filled 2021-09-17: qty 2

## 2021-09-17 NOTE — Progress Notes (Signed)
SLP Cancellation Note  Patient Details Name: Joshua Donovan MRN: [id number] DOB: [birth year]   Cancelled treatment:       Reason Eval/Treat Not Completed: Medical issues which prohibited therapy. Visited pt for communication session, but [name] is constantly coughing up secretions, tears streaming down his face, looks miserable. SLP repositioned, suctioned mouth, put a cool washcloth on his face and when he settled down, let him rest.    Denelle Capurro, [name] 09/17/2021, 9:39 AM

## 2021-09-17 NOTE — Progress Notes (Signed)
Was bedside for trach change  Tolerated well

## 2021-09-17 NOTE — Progress Notes (Signed)
Progress Note  Patient: Joshua Donovan LNL:[id number] DOB: Dec 23, 1955  DOA: 08/03/2021  DOS: 09/17/2021    Brief hospital course: Patient with history of ICH, type II DM, HTN, HLD presented to [facility] on 7/24 with right hand numbness and weakness, initially found to have a small brainstem stroke and ventral medulla.  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  He was intubated and transferred to [facility] for interventional radiology intervention.  Remained in the intensive care unit.  Now with tracheostomy and PEG tube placement.  Pending LTAC transfer.  7/24 presented to [facility], ventral medulla CVA 7/25 tx to [facility], treated with Cleviprex. 7/26 cerebral angiogram with stent placement to right vertebro-basilar junction, failed extubation, MRI brain> mild extension of stroke, now involving bilateral medial medullary, patent basilar artery and R VBJ stent  7/28 bedside tracheostomy by PCCM and PEG tube placement by general surgery 7/30 Hypertension persist despite adding oral agents, glucose remains elevated as well 7/31 Bleeding around trach site, bright red blood.  Underwent bronchoscopy, Trach Removed, patient orally reintubated, stoma packed. 8/2 ENT took patient to OR for trach redo 8/9 transferred to medical floor on tracheostomy now. 8/12 vomiting after bolus feeding with worsening hypoxia. 8/13 copious bleeding from the heparin injection site controlled with pressure dressing. 8/14 bleeding has resolved.  Trach secretions blood-tinged. 8/15 insurance denied LTAC appeal.  CSW working on SNF. 8/16>8/22: 25 beat run of NSVT on 8/21 8/28: peer to peer for LTAC looks like this was denied- Interval x-ray showed basilar opacities--respiratory culture ordered, hypertonic saline started scopolamine discontinued 8/29: V. tach runs-increased metoprolol Resolved 9/2: Slightly increased work of breathing-low-grade temps 99 for chest x-ray showed no  interval changes 9/5:  Ancef started 2/2 secretions --no wbc or fever  Assessment and Plan: Acute stroke of medulla oblongata: Initially loaded with aspirin and Brilinta.  Stent to right vertebrobasilar junction on 7/26.  Still with dense quadriplegia -Continue aspirin, Brilinta and Lipitor -Neurology signed off on 8/4. -Continue PT/OT/SLP   Acute respiratory failure with hypoxia due to aspiration pneumonia/hospital-acquired MSSA pneumonia.  CXR on 9/3 shows bibasilar atelectasis/infiltrate.  Concern about Enterobacter/MSSA HAP -Tracheostomy since 7/31, exchanged 9/8. - Continue ancef started 9/5 (defer duration to PCCM, or plan 7 days)  - Continue hypertonic saline nebs and chest physiotherapy per PCCM. -Continue scopolamine patch for secretion -Trach care per respiratory and PCCM.  -Pulmonary toilet   Alcohol abuse: Should be outside withdrawal window. - Continue thiamine, multivitamin and folic acid   Uncontrolled DM-2 with hyperglycemia, and hyperlipidemia: A1c 8.3% on 7/25.  Last Labs          Recent Labs  Lab 09/15/21 1919 09/15/21 2319 09/16/21 0358 09/16/21 0842 09/16/21 1235  GLUCAP 144* 125* 113* 194* 188*    -Continue levemir 30u BID  -Continue NovoLog 10 units + SSI every 4 hours -Continue statin. -Further adjustment as appropriate   Essential hypertension: Normotensive. - Continue amlodipine 10, hydrochlorothiazide 12.5 and Coreg 37.5 twice daily  - IV labetalol as needed   NSVT: Stable. -Coreg increased on 9/2. -Optimize Mg and K. -Cardiology saw patient previously on 8/2   Oropharyngeal dysphagia -Continue tube feed and free water via PEG. -SLP following   Hypokalemia: Resolved. Recheck in AM and q72h.   Tracheostomy complication/bleeding: In the setting of DAPT.  Resolved. -Tracheostomy revised by ENT   Small sacral decubitus prior to admission -Continue monitoring  Diffuse swelling, suspected to be due to inactivity. Trial lasix and monitor  clinically.  Subjective: Uses single words to communicate, denies pain or any acute issues important to him this morning.  Objective: Vitals:   09/17/21 0737 09/17/21 0856 09/17/21 1108 09/17/21 1130  BP: 129/75 129/75 129/75 129/81  Pulse: 84 85 91 90  Resp: (!) 33 (!) 24 (!) 30 (!) 29  Temp: 98.7 F (37.1 C)   98.3 F (36.8 C)  TempSrc: Oral   Oral  SpO2: 91%  95% 93%  Weight:      Height:       Gen: Quadriplegic 66 y.o. male in no distress Pulm: Nonlabored, diminished thru trach with moderate thick secretions. CV: Regular rate and rhythm. No murmur, rub, or gallop. No JVD, diffuse dependent edema. GI: Abdomen soft, non-tender, non-distended, with normoactive bowel sounds.  Ext: Warm, dry, no deformities Skin: No rashes, lesions or ulcers on visualized skin. Neuro: Alert, appears to be oriented, dense quadriplegia without new focal neurological deficits. Psych: Judgement and insight appear fair. Mood euthymic & affect congruent. Behavior is appropriate.    Data Personally reviewed: CBC: Recent Labs  Lab 09/12/21 0112 09/14/21 0140 09/15/21 0256  WBC 8.0 7.8 8.4  NEUTROABS 5.7  --   --   HGB 12.8* 12.2* 13.2  HCT 38.5* 36.9* 39.3  MCV 85.6 85.2 84.2  PLT 314 318 242   Basic Metabolic Panel: Recent Labs  Lab 09/12/21 0112 09/14/21 0140 09/15/21 0256  NA 139 134* 137  K 3.4* 3.3* 3.8  CL 101 98 100  CO2 '28 27 27  '$ GLUCOSE 140* 140* 164*  BUN '17 16 16  '$ CREATININE 0.62 0.59* 0.60*  CALCIUM 9.0 8.7* 9.1   GFR: Estimated Creatinine Clearance: 94.9 mL/min (A) (by C-G formula based on SCr of 0.6 mg/dL (L)). Liver Function Tests: No results for input(s): "AST", "ALT", "ALKPHOS", "BILITOT", "PROT", "ALBUMIN" in the last 168 hours. No results for input(s): "LIPASE", "AMYLASE" in the last 168 hours. No results for input(s): "AMMONIA" in the last 168 hours. Coagulation Profile: No results for input(s): "INR", "PROTIME" in the last 168 hours. Cardiac Enzymes: No  results for input(s): "CKTOTAL", "CKMB", "CKMBINDEX", "TROPONINI" in the last 168 hours. BNP (last 3 results) No results for input(s): "PROBNP" in the last 8760 hours. HbA1C: No results for input(s): "HGBA1C" in the last 72 hours. CBG: Recent Labs  Lab 09/16/21 2315 09/17/21 0303 09/17/21 0736 09/17/21 1128 09/17/21 1501  GLUCAP 172* 132* 108* 187* 121*   Lipid Profile: No results for input(s): "CHOL", "HDL", "LDLCALC", "TRIG", "CHOLHDL", "LDLDIRECT" in the last 72 hours. Thyroid Function Tests: No results for input(s): "TSH", "T4TOTAL", "FREET4", "T3FREE", "THYROIDAB" in the last 72 hours. Anemia Panel: No results for input(s): "VITAMINB12", "FOLATE", "FERRITIN", "TIBC", "IRON", "RETICCTPCT" in the last 72 hours. Urine analysis:    Component Value Date/Time   COLORURINE RED (A) 08/06/2021 1811   APPEARANCEUR CLOUDY (A) 08/06/2021 1811   APPEARANCEUR Clear 01/22/2019 0000   LABSPEC 1.016 08/06/2021 1811   PHURINE 6.0 08/06/2021 1811   GLUCOSEU 50 (A) 08/06/2021 1811   HGBUR LARGE (A) 08/06/2021 1811   BILIRUBINUR NEGATIVE 08/06/2021 1811   BILIRUBINUR Negative 01/22/2019 0000   KETONESUR NEGATIVE 08/06/2021 1811   PROTEINUR 100 (A) 08/06/2021 1811   NITRITE NEGATIVE 08/06/2021 1811   LEUKOCYTESUR NEGATIVE 08/06/2021 1811   Family Communication: None at bedside  Disposition: Status is: Inpatient Remains inpatient appropriate because: Awaiting adequate placement at SNF [facility] denied LTAC). Patient is medically stable for discharge. Planned Discharge Destination: [place]  Bonner Puna, MD 09/17/2021 3:43 PM Page by [email or web address]

## 2021-09-17 NOTE — Progress Notes (Signed)
Pharmacy Antibiotic Note  Joshua Donovan is a 66 y.o. male admitted on 08/03/2021 with stroke now on day 42 of hospital admission.  Pharmacy has been consulted for cefazolin dosing for MSSA HAP.  Renal function is stable with CrCl >60 mL/min.  Plan: Continue cefazolin 2g Q8H Monitor renal function for dose adjustments Anticipate 5-7 days course pending secretions. Will follow-up on planned duration of therapy.  Height: '5\' 7"'$  (170.2 cm) Weight: 85.7 kg (188 lb 15 oz) IBW/kg (Calculated) : 66.1  Temp (24hrs), Avg:98.5 F (36.9 C), Min:98.3 F (36.8 C), Max:98.9 F (37.2 C)  Recent Labs  Lab 09/12/21 0112 09/14/21 0140 09/15/21 0256  WBC 8.0 7.8 8.4  CREATININE 0.62 0.59* 0.60*     Estimated Creatinine Clearance: 94.9 mL/min (A) (by C-G formula based on SCr of 0.6 mg/dL (L)).    Allergies  Allergen Reactions   Lisinopril Anaphylaxis and Swelling    Angioedema (filled but not taking per family, 08/08/21).  TDD.   Anchovies [Fish Allergy] Swelling   Sulfa Antibiotics Swelling    Antimicrobials this admission: CTX 7/28 >> 7/30, 7/31 >> 8/2 Cefepime 7/30 >> 7/31, 8/3 >> 8/6 Cefazolin 9/5 >>  Microbiology results: 7/28 TA - GNR on Gram stain 7/28 TA bronch - AmpC Enterobacter aerogenes  8/28 TA: MSSA, Strep (beta-hemolytic)  Merrilee Jansky, PharmD Clinical Pharmacist 09/17/2021 2:04 PM

## 2021-09-17 NOTE — Procedures (Addendum)
Tracheostomy Change Note  Patient Details:   Name: Joshua Donovan DOB: 1955-09-26 MRN: [id number]    Airway Documentation:     Evaluation  O2 sats: stable throughout Complications: No apparent complications Patient did tolerate procedure well. Bilateral Breath Sounds: Diminished, Rhonchi   Trach was changed without any complications over a bougie. Two RT's and CCM MD were at the bedside. Positive color change noted on CO2 detector. Lurline Idol was secured with trach ties.   Beula Joyner L 09/17/2021, 11:05 AM

## 2021-09-17 NOTE — Progress Notes (Signed)
Physical Therapy Treatment Patient Details Name: Joshua Donovan MRN: [id number] DOB: 1955/01/25 Today's Date: 09/17/2021   History of Present Illness Pt is a 66 y.o. male who presented 08/02/21 with R foot pain and R-sided weakness. Transferred to [facility] 7/25. 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 S/p stent placement to the R vertebrobasilar junction 7/26, failed extubation. Trach and PEG placed 7/28. S/p bronchoscopic evaluation, oral reintubation and tracheostomy removal with stoma packing 7/31 due to trach site bleeding. S/p trach revision 8/2. PMH: DM, GERD, TBI with prior ICH, HLD, HTN    PT Comments    Session focused on transferring pt to and positioning pt in tilt in space w/c along with neuromuscular re-education to try to facilitate muscular activation throughout his body. Noted active movement of his neck musculature, but no activation below his neck. Requested RN order an air mattress for pressure relief to prevent pressure wounds. Updated PT goals. Will continue to follow acutely. Current recommendations remain appropriate.  For positioning in tilt in space: place x2 pillows side by side vertically at back support of chair to make a V to control pt's trunk. Place pillow under his feet and pads between his legs and the metal frame of the chair for comfort and ankle dorsiflexion stretch. Place towel roll on each side of head and strap across forehead to hold neck in place. Place bil arms up on pillows. May need to place pillow on seat under his buttocks.      Recommendations for follow up therapy are one component of a  multi-disciplinary discharge planning process, led by the attending physician.  Recommendations may be updated based on patient status, additional functional criteria and insurance authorization.  Follow Up Recommendations  PT at Long-term acute care hospital Can patient physically be transported by private vehicle: No   Assistance Recommended at Discharge Frequent or constant Supervision/Assistance  Patient can return home with the following Assistance with cooking/housework;Two people to help with walking and/or transfers;Two people to help with bathing/dressing/bathroom;Assistance with feeding;Direct supervision/assist for medications management;Direct supervision/assist for financial management;Assist for transportation;Help with stairs or ramp for entrance   Equipment Recommendations  Other (comment) (TBA)    Recommendations for Other Services       Precautions / Restrictions Precautions Precautions: Fall Precaution Comments: trach collar, peg, SBP < 180, prevalon boots, copious trach secretions, watch HR Restrictions Weight Bearing Restrictions: No     Mobility  Bed Mobility Overal bed mobility: Needs Assistance Bed Mobility: Rolling Rolling: Total assist, +2 for physical assistance, +2 for safety/equipment         General bed mobility comments: TAx2 to roll either direction, cuing pt to look to each direction and try to rotate neck with AAROM and then pt's leg dependently flexed and pt rolled with bed pads.    Transfers Overall transfer level: Needs assistance Equipment used: Ambulation equipment used Transfers: Bed to chair/wheelchair/BSC             General transfer comment: Maximove used to lift pt bed > w/c with head supported throughout. For positioning in tilt in space: place x2 pillows side by side vertically at back support of chair to make a V to  control pt's trunk. Place pillow under his feet and pads between his legs and the metal frame of the chair for  comfort and ankle dorsiflexion stretch. Place towel roll on each side of head and strap across forehead to hold neck in place. Place bil arms up on pillows. Transfer via Lift Equipment: Maximove  Ambulation/Gait               General Gait Details: unable   Marine scientist Rankin (Stroke Patients Only) Modified Rankin (Stroke Patients Only) Pre-Morbid Rankin Score: No symptoms Modified Rankin: Severe disability     Balance Overall balance assessment: Needs assistance Sitting-balance support: No upper extremity supported, Feet supported Sitting balance-Leahy Scale: Zero Sitting balance - Comments: TA to bring trunk off back rest of w/c momentarily       Standing balance comment: unable                            Cognition Arousal/Alertness: Awake/alert Behavior During Therapy: WFL for tasks assessed/performed Overall Cognitive Status: Difficult to assess                                 General Comments: attempting to mouth several word responses but does mouth yes/no when indicated also. Follows simple cues with extra time for head control.        Exercises Other Exercises Other Exercises: AAROM neck flexion/extension and bil rotation >15x each Other Exercises: cues to adduct scapulas, move legs, squeeze hands but no success Other Exercises: PNF D1 flexion/extension performed with bil UEs 5x (no muscle activation noted by pt)    General Comments General comments (skin integrity, edema, etc.): trach collar at 5L 28% FiO2, VSS      Pertinent Vitals/Pain Pain Assessment Pain Assessment: Faces Faces Pain Scale: Hurts little more Pain Location: grimacing with finger extension ROM Pain Descriptors / Indicators: Grimacing Pain Intervention(s): Monitored during session, Limited activity within patient's tolerance, Repositioned    Home Living                          Prior Function             PT Goals (current goals can now be found in the care plan section) Acute Rehab PT Goals Patient Stated Goal: agreeable to OOB to chair PT Goal Formulation: Patient unable to participate in goal setting Time For Goal Achievement: 10/01/21 Potential to Achieve Goals: Fair Additional Goals Additional Goal #1: Pt to demonstrate some core muscle activation to work towards sitting balance/control. Progress towards PT goals: Progressing toward goals (slowly)    Frequency    Min 3X/week      PT Plan Current plan remains appropriate    Co-evaluation              AM-PAC PT "6 Clicks" Mobility   Outcome Measure  Help needed turning from your back to your side while in a flat bed without using bedrails?: Total Help needed moving from lying on your back to sitting on the side of a flat bed without using bedrails?: Total Help needed moving to and from a bed to a chair (including a wheelchair)?: Total Help needed standing up from a chair using your arms (e.g., wheelchair or bedside chair)?: Total Help needed  to walk in hospital room?: Total Help needed climbing 3-5 steps with a railing? : Total 6 Click Score: 6    End of Session Equipment Utilized During Treatment: Oxygen Activity Tolerance: Patient tolerated treatment well Patient left: in chair;with call bell/phone within reach;with chair alarm set Nurse Communication: Mobility status;Other (comment) (need for air mattress with noted wounds on buttocks) PT Visit Diagnosis: Muscle weakness (generalized) (M62.81);Other symptoms and signs involving the nervous system (R29.898);Other abnormalities of gait and mobility (R26.89);Other (comment) Hemiplegia - Right/Left:  (quadriparesis) Hemiplegia - dominant/non-dominant:  (quadriparesis) Hemiplegia - caused by: Cerebral infarction     Time: 1307-1400 PT Time Calculation (min) (ACUTE ONLY): 53 min  Charges:  $Therapeutic Activity: 23-37 mins $Neuromuscular Re-education:  23-37 mins                     Moishe Spice, PT, DPT Acute Rehabilitation Services  Office: [place] 09/17/2021, 3:09 PM

## 2021-09-18 ENCOUNTER — Inpatient Hospital Stay (HOSPITAL_COMMUNITY): Payer: Medicare PPO

## 2021-09-18 LAB — COMPREHENSIVE METABOLIC PANEL
ALT: 72 U/L — ABNORMAL HIGH (ref 0–44)
AST: 49 U/L — ABNORMAL HIGH (ref 15–41)
Albumin: 2.6 g/dL — ABNORMAL LOW (ref 3.5–5.0)
Alkaline Phosphatase: 41 U/L (ref 38–126)
Anion gap: 9 (ref 5–15)
BUN: 13 mg/dL (ref 8–23)
CO2: 26 mmol/L (ref 22–32)
Calcium: 8.8 mg/dL — ABNORMAL LOW (ref 8.9–10.3)
Chloride: 100 mmol/L (ref 98–111)
Creatinine, Ser: 0.5 mg/dL — ABNORMAL LOW (ref 0.61–1.24)
GFR, Estimated: >60 mL/min (ref >60)
Glucose, Bld: 148 mg/dL — ABNORMAL HIGH (ref 70–99)
Potassium: 3.6 mmol/L (ref 3.5–5.1)
Sodium: 135 mmol/L (ref 135–145)
Total Bilirubin: 0.5 mg/dL (ref 0.3–1.2)
Total Protein: 6.2 g/dL — ABNORMAL LOW (ref 6.5–8.1)

## 2021-09-18 LAB — GLUCOSE, CAPILLARY
Glucose-Capillary: 140 mg/dL — ABNORMAL HIGH (ref 70–99)
Glucose-Capillary: 170 mg/dL — ABNORMAL HIGH (ref 70–99)
Glucose-Capillary: 154 mg/dL — ABNORMAL HIGH (ref 70–99)
Glucose-Capillary: 127 mg/dL — ABNORMAL HIGH (ref 70–99)
Glucose-Capillary: 110 mg/dL — ABNORMAL HIGH (ref 70–99)
Glucose-Capillary: 150 mg/dL — ABNORMAL HIGH (ref 70–99)

## 2021-09-18 NOTE — Progress Notes (Signed)
Progress Note  Patient: Joshua Donovan ATF:[id number] DOB: 1955/10/24  DOA: 08/03/2021  DOS: 09/18/2021    Brief hospital course: Patient with history of ICH, type II DM, HTN, HLD presented to [facility] on 7/24 with right hand numbness and weakness, initially found to have a small brainstem stroke and ventral medulla.  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  He was intubated and transferred to [facility] for interventional radiology intervention.  Remained in the intensive care unit.  Now with tracheostomy and PEG tube placement.  Pending LTAC transfer.  7/24 presented to [facility], ventral medulla CVA 7/25 tx to [facility], treated with Cleviprex. 7/26 cerebral angiogram with stent placement to right vertebro-basilar junction, failed extubation, MRI brain> mild extension of stroke, now involving bilateral medial medullary, patent basilar artery and R VBJ stent  7/28 bedside tracheostomy by PCCM and PEG tube placement by general surgery 7/30 Hypertension persist despite adding oral agents, glucose remains elevated as well 7/31 Bleeding around trach site, bright red blood.  Underwent bronchoscopy, Trach Removed, patient orally reintubated, stoma packed. 8/2 ENT took patient to OR for trach redo 8/9 transferred to medical floor on tracheostomy now. 8/12 vomiting after bolus feeding with worsening hypoxia. 8/13 copious bleeding from the heparin injection site controlled with pressure dressing. 8/14 bleeding has resolved.  Trach secretions blood-tinged. 8/15 insurance denied LTAC appeal.  CSW working on SNF. 8/16>8/22: 25 beat run of NSVT on 8/21 8/28: peer to peer for LTAC looks like this was denied- Interval x-ray showed basilar opacities--respiratory culture ordered, hypertonic saline started scopolamine discontinued 8/29: V. tach runs-increased metoprolol Resolved 9/2: Slightly increased work of breathing-low-grade temps 99 for chest x-ray showed no  interval changes 9/5:  Ancef started 2/2 secretions --no wbc or fever  Assessment and Plan: Acute stroke of medulla oblongata: Initially loaded with aspirin and Brilinta.  Stent to right vertebrobasilar junction on 7/26.  Still with dense quadriplegia - Continue aspirin, Brilinta and Lipitor - Neurology signed off on 8/4. Will need outpatient follow up. - Continue PT/OT/SLP > seeking SNF   Acute respiratory failure with hypoxia due to aspiration pneumonia/hospital-acquired MSSA pneumonia.  CXR on 9/3 shows bibasilar atelectasis/infiltrate.  Concern about Enterobacter/MSSA HAP -Tracheostomy since 7/31, exchanged 9/8. - Continue ancef started 9/5 (defer duration to PCCM, or plan 7 days)  - Continue hypertonic saline nebs and chest physiotherapy per PCCM. -Continue scopolamine patch for secretions, if too thick, consider stopping. -Trach care per respiratory and PCCM.  -Pulmonary toilet   Alcohol abuse:   - Continue thiamine, multivitamin and folic acid   Uncontrolled DM-2 with hyperglycemia, and hyperlipidemia: A1c 8.3% on 7/25.  -Continue levemir 30u BID  -Continue NovoLog 10 units + SSI every 4 hours -Continue statin. -Further adjustment as appropriate   Essential hypertension: Normotensive. - Continue amlodipine 10, hydrochlorothiazide 12.5 and Coreg 37.5 twice daily  - IV labetalol as needed  LFT elevation: Relatively stable AST and ALT with normal Bili/AlkPhos.  - Check RUS U/S.   NSVT: Stable. -Coreg increased on 9/2. -Optimize Mg and K. -Cardiology saw patient previously on 8/2   Oropharyngeal dysphagia -Continue tube feed and free water via PEG. -SLP following   Hypokalemia: Resolved. Recheck in AM and q72h.   Tracheostomy complication/bleeding: In the setting of DAPT.  Resolved. -Tracheostomy revised by ENT   Small sacral decubitus prior to admission -Continue monitoring  Diffuse swelling, suspected to be due to inactivity. Trial lasix and monitor  clinically.  Subjective: Denies any pain or other  new issues or questions this morning.   Objective: Vitals:   09/18/21 0500 09/18/21 0829 09/18/21 0844 09/18/21 1116  BP:  (!) 145/89  115/71  Pulse:  80 79 82  Resp:  (!) 26 (!) 30 (!) 23  Temp:  98.2 F (36.8 C)  97.7 F (36.5 C)  TempSrc:  Oral  Oral  SpO2:  97% 98% 96%  Weight: 85.7 kg     Height:       Gen: 66 y.o. male in no distress Pulm: Nonlabored breathing room air. Clear thru trach. CV: Regular rate and rhythm. No murmur, rub, or gallop. No JVD, no pitting dependent edema. GI: Abdomen soft, non-tender, non-distended, with normoactive bowel sounds.  Ext: Warm, no deformities Skin: No new rashes, lesions or ulcers on visualized skin. Neuro: Alert, oriented, stable quadriplegia with reportedly intact sensation without new focal neurological deficits. Psych: Judgement and insight appear fair. Mood euthymic & affect congruent. Behavior is appropriate.    Data Personally reviewed: CBC: Recent Labs  Lab 09/12/21 0112 09/14/21 0140 09/15/21 0256  WBC 8.0 7.8 8.4  NEUTROABS 5.7  --   --   HGB 12.8* 12.2* 13.2  HCT 38.5* 36.9* 39.3  MCV 85.6 85.2 84.2  PLT 314 318 174   Basic Metabolic Panel: Recent Labs  Lab 09/12/21 0112 09/14/21 0140 09/15/21 0256 09/18/21 0620  NA 139 134* 137 135  K 3.4* 3.3* 3.8 3.6  CL 101 98 100 100  CO2 '28 27 27 26  '$ GLUCOSE 140* 140* 164* 148*  BUN '17 16 16 13  '$ CREATININE 0.62 0.59* 0.60* 0.50*  CALCIUM 9.0 8.7* 9.1 8.8*   GFR: Estimated Creatinine Clearance: 94.9 mL/min (A) (by C-G formula based on SCr of 0.5 mg/dL (L)). Liver Function Tests: Recent Labs  Lab 09/18/21 0620  AST 49*  ALT 72*  ALKPHOS 41  BILITOT 0.5  PROT 6.2*  ALBUMIN 2.6*   Urine analysis:    Component Value Date/Time   COLORURINE RED (A) 08/06/2021 1811   APPEARANCEUR CLOUDY (A) 08/06/2021 1811   APPEARANCEUR Clear 01/22/2019 0000   LABSPEC 1.016 08/06/2021 1811   PHURINE 6.0 08/06/2021 1811    GLUCOSEU 50 (A) 08/06/2021 1811   HGBUR LARGE (A) 08/06/2021 1811   BILIRUBINUR NEGATIVE 08/06/2021 1811   BILIRUBINUR Negative 01/22/2019 0000   KETONESUR NEGATIVE 08/06/2021 1811   PROTEINUR 100 (A) 08/06/2021 1811   NITRITE NEGATIVE 08/06/2021 1811   LEUKOCYTESUR NEGATIVE 08/06/2021 1811   Family Communication: None at bedside  Disposition: Status is: Inpatient Remains inpatient appropriate because: Awaiting adequate placement at SNF [facility] denied LTAC). Patient is medically stable for discharge. Planned Discharge Destination: Skilled nursing facility  Patrecia Pour, MD 09/18/2021 2:16 PM Page by [email or web address]

## 2021-09-19 LAB — GLUCOSE, CAPILLARY
Glucose-Capillary: 112 mg/dL — ABNORMAL HIGH (ref 70–99)
Glucose-Capillary: 137 mg/dL — ABNORMAL HIGH (ref 70–99)
Glucose-Capillary: 144 mg/dL — ABNORMAL HIGH (ref 70–99)
Glucose-Capillary: 146 mg/dL — ABNORMAL HIGH (ref 70–99)
Glucose-Capillary: 147 mg/dL — ABNORMAL HIGH (ref 70–99)
Glucose-Capillary: 161 mg/dL — ABNORMAL HIGH (ref 70–99)

## 2021-09-19 NOTE — Progress Notes (Signed)
Progress Note  Patient: Joshua Donovan JEH:[id number] DOB: 02/19/1955  DOA: 08/03/2021  DOS: 09/19/2021    Brief hospital course: Patient with history of ICH, type II DM, HTN, HLD presented to [facility] on 7/24 with right hand numbness and weakness, initially found to have a small brainstem stroke and ventral medulla.  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  He was intubated and transferred to [facility] for interventional radiology intervention.  Remained in the intensive care unit.  Now with tracheostomy and PEG tube placement.  Pending LTAC transfer.  7/24 presented to [facility], ventral medulla CVA 7/25 tx to [facility], treated with Cleviprex. 7/26 cerebral angiogram with stent placement to right vertebro-basilar junction, failed extubation, MRI brain> mild extension of stroke, now involving bilateral medial medullary, patent basilar artery and R VBJ stent  7/28 bedside tracheostomy by PCCM and PEG tube placement by general surgery 7/30 Hypertension persist despite adding oral agents, glucose remains elevated as well 7/31 Bleeding around trach site, bright red blood.  Underwent bronchoscopy, Trach Removed, patient orally reintubated, stoma packed. 8/2 ENT took patient to OR for trach redo 8/9 transferred to medical floor on tracheostomy now. 8/12 vomiting after bolus feeding with worsening hypoxia. 8/13 copious bleeding from the heparin injection site controlled with pressure dressing. 8/14 bleeding has resolved.  Trach secretions blood-tinged. 8/15 insurance denied LTAC appeal.  CSW working on SNF. 8/16>8/22: 25 beat run of NSVT on 8/21 8/28: peer to peer for LTAC looks like this was denied- Interval x-ray showed basilar opacities--respiratory culture ordered, hypertonic saline started scopolamine discontinued 8/29: V. tach runs-increased metoprolol Resolved 9/2: Slightly increased work of breathing-low-grade temps 99 for chest x-ray showed no  interval changes 9/5:  Ancef started 2/2 secretions --no wbc or fever  Assessment and Plan: Acute stroke of medulla oblongata: Initially loaded with aspirin and Brilinta.  Stent to right vertebrobasilar junction on 7/26.  Still with dense quadriplegia - Continue aspirin, Brilinta and Lipitor - Neurology signed off on 8/4. Will need outpatient follow up. - Continue PT/OT/SLP > seeking SNF   Acute respiratory failure with hypoxia due to aspiration pneumonia/hospital-acquired MSSA pneumonia.  CXR on 9/3 shows bibasilar atelectasis/infiltrate.  Concern about Enterobacter/MSSA HAP - Tracheostomy since 7/31, exchanged 9/8. - Continue ancef started 9/5 (defer duration to PCCM, or plan 7 days) Tmax over past 24 hours is 99.49F  - Continue hypertonic saline nebs and chest physiotherapy per PCCM. - Continue scopolamine patch for secretions, if too thick, consider stopping. - Trach care per respiratory and PCCM.  - Pulmonary toilet   Alcohol abuse:   - Continue thiamine, multivitamin and folic acid   Uncontrolled DM-2 with hyperglycemia, and hyperlipidemia: A1c 8.3% on 7/25.  - Continue levemir 30u BID  - Continue NovoLog 10 units + SSI every 4 hours - Continue statin. - Further adjustment as appropriate. At inpatient goal.   Essential hypertension: Better controlled. - Continue amlodipine 10, hydrochlorothiazide 12.5 and Coreg 37.5 twice daily  - IV labetalol as needed  LFT elevation: Relatively stable AST and ALT with normal Bili/AlkPhos.  - Check RUS U/S.   NSVT: Stable. - Coreg increased on 9/2. - Optimize Mg and K. - Cardiology saw patient previously on 8/2   Oropharyngeal dysphagia - Continue tube feed and free water via PEG. - SLP following   Hypokalemia: Resolved. Recheck in AM and q72h.   Tracheostomy complication/bleeding: In the setting of DAPT.  Resolved. -Tracheostomy revised by ENT   Small sacral decubitus prior to admission -  Continue monitoring  Diffuse  swelling, suspected to be due to inactivity. Not particularly responsive to lasix. Hypoalbuminemia contributing as well.  - Optimize nutrition.  Subjective: Again whispers "no" when asked about pain or other concerns. No events overnight were reported.  Objective: Vitals:   09/19/21 0846 09/19/21 0850 09/19/21 1116 09/19/21 1143  BP:   122/79   Pulse: 89 85 82   Resp: (!) '29 20 18   '$ Temp:    98.7 F (37.1 C)  TempSrc:    Oral  SpO2: 96% 96% 93%   Weight:      Height:       Gen: 66 y.o. male in no distress Pulm: Nonlabored breathing thru trach. Rhonchi/upper airway. CV: Regular rate and rhythm. No murmur, rub, or gallop. No JVD, trace pitting dependent edema. GI: Abdomen soft, non-tender, non-distended, with normoactive bowel sounds. PEG site c/d/i Ext: Warm, no deformities Skin: No new rashes, lesions or ulcers on visualized skin. Neuro: Alert and oriented, some control of facial muscles, otherwise quadriplegic. Psych: Judgement and insight appear fair. Mood euthymic & affect congruent. Behavior is appropriate.    Data Personally reviewed: CBC: Recent Labs  Lab 09/14/21 0140 09/15/21 0256  WBC 7.8 8.4  HGB 12.2* 13.2  HCT 36.9* 39.3  MCV 85.2 84.2  PLT 318 433   Basic Metabolic Panel: Recent Labs  Lab 09/14/21 0140 09/15/21 0256 09/18/21 0620  NA 134* 137 135  K 3.3* 3.8 3.6  CL 98 100 100  CO2 '27 27 26  '$ GLUCOSE 140* 164* 148*  BUN '16 16 13  '$ CREATININE 0.59* 0.60* 0.50*  CALCIUM 8.7* 9.1 8.8*   GFR: Estimated Creatinine Clearance: 94.9 mL/min (A) (by C-G formula based on SCr of 0.5 mg/dL (L)). Liver Function Tests: Recent Labs  Lab 09/18/21 0620  AST 49*  ALT 72*  ALKPHOS 41  BILITOT 0.5  PROT 6.2*  ALBUMIN 2.6*   Urine analysis:    Component Value Date/Time   COLORURINE RED (A) 08/06/2021 1811   APPEARANCEUR CLOUDY (A) 08/06/2021 1811   APPEARANCEUR Clear 01/22/2019 0000   LABSPEC 1.016 08/06/2021 1811   PHURINE 6.0 08/06/2021 1811    GLUCOSEU 50 (A) 08/06/2021 1811   HGBUR LARGE (A) 08/06/2021 1811   BILIRUBINUR NEGATIVE 08/06/2021 1811   BILIRUBINUR Negative 01/22/2019 0000   KETONESUR NEGATIVE 08/06/2021 1811   PROTEINUR 100 (A) 08/06/2021 1811   NITRITE NEGATIVE 08/06/2021 1811   LEUKOCYTESUR NEGATIVE 08/06/2021 1811   Family Communication: None at bedside  Disposition: Status is: Inpatient Remains inpatient appropriate because: Awaiting adequate placement at SNF [facility] denied LTAC). Patient is medically stable for discharge. Planned Discharge Destination: Skilled nursing facility  Patrecia Pour, MD 09/19/2021 12:16 PM Page by [email or web address]

## 2021-09-20 ENCOUNTER — Inpatient Hospital Stay (HOSPITAL_COMMUNITY): Payer: Medicare PPO

## 2021-09-20 DIAGNOSIS — J15211 Pneumonia due to Methicillin susceptible Staphylococcus aureus: Secondary | ICD-10-CM

## 2021-09-20 LAB — GLUCOSE, CAPILLARY
Glucose-Capillary: 130 mg/dL — ABNORMAL HIGH (ref 70–99)
Glucose-Capillary: 155 mg/dL — ABNORMAL HIGH (ref 70–99)
Glucose-Capillary: 155 mg/dL — ABNORMAL HIGH (ref 70–99)
Glucose-Capillary: 160 mg/dL — ABNORMAL HIGH (ref 70–99)
Glucose-Capillary: 170 mg/dL — ABNORMAL HIGH (ref 70–99)
Glucose-Capillary: 59 mg/dL — ABNORMAL LOW (ref 70–99)
Glucose-Capillary: 80 mg/dL (ref 70–99)

## 2021-09-20 MED ORDER — HYDROCHLOROTHIAZIDE 25 MG PO TABS
25.0000 mg | ORAL_TABLET | Freq: Every day | ORAL | Status: DC
Start: 2021-09-21 — End: 2021-10-21
  Administered 2021-09-21 – 2021-10-20 (×30): 25 mg
  Filled 2021-09-20 (×30): qty 1

## 2021-09-20 NOTE — Progress Notes (Signed)
NAME:  Joshua Donovan, MRN:  [id number], DOB:  Sep 05, 1955, LOS: 53 ADMISSION DATE:  08/03/2021, CONSULTATION DATE:  7/26 REFERRING MD:  Colvin Caroli FOR CONSULT:  CVA   History of Present Illness:  66 y/o male presented to [facility] on 7/24 with R hand numbness and weakness, found to have small brainstem stroke in ventral medulla.  Symptoms worsened and required transfer to [facility] for neuro IR intervention where he had a stent placed in R vertebrovasilar junction.  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  He had a tracheostomy performed on 7/26 c/b dislodgement and ENT revision on 8/2.  Pertinent  Medical History  TBI with ICH, DM, HTN, HLD  Significant Hospital Events: Including procedures, antibiotic start and stop dates in addition to other pertinent events    7/24 presented to [facility], ventral medulla CVA 7/25 tx to [facility] 7/26 cerebral angiogram with stent placement to right vertebro-basilar junction, failed extubation, left radial aline MRI brain> mild extension of stroke, now involving bilateral medial medullary, patent basilar artery and R VBJ stent  7/28 underwent bedside percutaneous tracheostomy and PEG tube placement, tolerated well 7/29 no acute issues overnight currently on SBT trial this a.m. and tolerating well, Cleviprex resumed earlier this a.m. due to severe hypertension 7/30 Hypertension persist despite adding oral agents, glucose remains elevated as well 7/31 Bleeding around trach site, bright red blood. Copious bloody secretions. Cleviprex off, SBPs 150s-160s. Basal insulin/TF coverage increased. Cefepime deescalated to ceftriaxone. CXR stable. Later in afternoon, continued peritrach bleeding. Bronchoscopic eval completed with intratracheal bleeding associated with trach. Removed, patient orally reintubated, stoma packed. 8/1 ENT consulted for trach revision. Lightly sedated. Vent full support/PRVC, given fatigue following events of yesterday. ENT attempted to  evaluate trach at bedside, could not pass scope. Plan for OR 8/2. ASA/Brilinta held. 8/2 to OR for trach redo 8/6 trach collar during day shift, back on vent overnight 8/10 Hemoptysis, blood with suctioning > improved 8/11 8/14 on 8L / 35% ATC  8/28: Stable vitals, increased tenacious secretions from trach. CXR suggesting new LLL basilar opacities atelectasis vs pna 09/07/2021 Decreased volume and thickness of secretions. No fevers. Tracheal aspirate from 8/28 with mod gram positive cocci and few gram negative rods today, culture pending. 9/5 start cefazolin x 7 days for MSSA pneumonia  Interim History / Subjective:  Denies complaints, no SOB, copious secretions.   Objective   Blood pressure (!) 138/94, pulse 80, temperature 98.9 F (37.2 C), temperature source Oral, resp. rate (!) 22, height '5\' 7"'$  (1.702 m), weight 85.7 kg, SpO2 95 %.    FiO2 (%):  [28 %] 28 %   Intake/Output Summary (Last 24 hours) at 09/20/2021 0744 Last data filed at 09/19/2021 2335 Gross per 24 hour  Intake 220 ml  Output 1550 ml  Net -1330 ml    Filed Weights   09/18/21 0500 09/19/21 0500 09/20/21 0500  Weight: 85.7 kg 85.7 kg 85.7 kg    Examination: General: chronically ill appearing man lying in bed in NAD watching TV HEENT: Basye/AT, eyes anicteric Neuro: awake, alert, moving head side to side. Trying to talk to communicate. CV: S1S2, RRR PULM: rhales bilaterally, copious purulent secretions from trach  GI: soft, NT. No erythema around PEG  Extremities: no cyanosis Skin: warm, dry, no diffuse rashes  9/9 labs reviewed.  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 MSSA pneumonia   Plan: Con't routine trach care CXR today Con't cefazolin to complete 1 week course Con't mucolytics Resp culture if febrile or infiltrate on CXR Con't supporitve care  Rest of care per primary. PCCM will con't to follow weekly. Please call in the interim with questions.   Julian Hy, DO 09/20/21 8:40 AM [facility]

## 2021-09-20 NOTE — Progress Notes (Signed)
SLP Cancellation Note  Patient Details Name: Joshua Donovan MRN: [id number] DOB: 03-Apr-1955   Cancelled treatment:       Reason Eval/Treat Not Completed: Patient at procedure or test/unavailable. Pt getting cleaned up. Will f/u tomorrow   Annaly Skop, [name] 09/20/2021, 2:42 PM

## 2021-09-20 NOTE — Progress Notes (Signed)
Progress Note  Patient: Joshua Donovan UXL:[id number] DOB: 1955/07/10  DOA: 08/03/2021  DOS: 09/20/2021    Brief hospital course: Patient with history of ICH, type II DM, HTN, HLD presented to [facility] regional hospital on 7/24 with right hand numbness and weakness, initially found to have a small brainstem stroke and ventral medulla.  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  He was intubated and transferred to [facility] for interventional radiology intervention.  Remained in the intensive care unit.  Now with tracheostomy and PEG tube placement.  Pending LTAC transfer.  7/24 presented to [facility], ventral medulla CVA 7/25 tx to [facility], treated with Cleviprex. 7/26 cerebral angiogram with stent placement to right vertebro-basilar junction, failed extubation, MRI brain> mild extension of stroke, now involving bilateral medial medullary, patent basilar artery and R VBJ stent  7/28 bedside tracheostomy by PCCM and PEG tube placement by general surgery 7/30 Hypertension persist despite adding oral agents, glucose remains elevated as well 7/31 Bleeding around trach site, bright red blood.  Underwent bronchoscopy, Trach Removed, patient orally reintubated, stoma packed. 8/2 ENT took patient to OR for trach redo 8/9 transferred to medical floor on tracheostomy now. 8/12 vomiting after bolus feeding with worsening hypoxia. 8/13 copious bleeding from the heparin injection site controlled with pressure dressing. 8/14 bleeding has resolved.  Trach secretions blood-tinged. 8/15 insurance denied LTAC appeal.  CSW working on SNF. 8/16>8/22: 25 beat run of NSVT on 8/21 8/28: peer to peer for LTAC looks like this was denied- Interval x-ray showed basilar opacities--respiratory culture ordered, hypertonic saline started scopolamine discontinued 8/29: V. tach runs-increased metoprolol Resolved 9/2: Slightly increased work of breathing-low-grade temps 99 for chest x-ray showed no  interval changes 9/5:  Ancef started 2/2 secretions --no wbc or fever  Assessment and Plan: Acute stroke of medulla oblongata: Initially loaded with aspirin and Brilinta.  Stent to right vertebrobasilar junction on 7/26.  Still with dense quadriplegia - Continue aspirin, brilinta and lipitor - Neurology signed off on 8/4. Will need outpatient follow up. - Continue PT/OT/SLP > seeking SNF   Acute respiratory failure with hypoxia due to aspiration pneumonia/hospital-acquired MSSA pneumonia.  CXR on 9/3 shows bibasilar atelectasis/infiltrate.  Concern about Enterobacter/MSSA HAP - Tracheostomy since 7/31, exchanged 9/8. Followed by PCCM. - Continue ancef 9/15 - 9/12. Repeat CXR today. - Continue hypertonic saline nebs and chest physiotherapy per PCCM. - Continue scopolamine patch for secretions, if too thick, consider stopping. - Trach care per respiratory and PCCM.  - Pulmonary toilet   Alcohol abuse:   - Continue thiamine, multivitamin and folic acid   Uncontrolled DM-2 with hyperglycemia, and hyperlipidemia: A1c 8.3% on 7/25.  - Continue levemir 30u BID  - Continue NovoLog 10 units + SSI every 4 hours - Continue statin. - Further adjustment as appropriate. At inpatient goal.   Essential hypertension: Better controlled. - Continue amlodipine '10mg'$  daily, coreg 37.'5mg'$  BID - Increase HCTZ to '25mg'$  daily  - IV labetalol as needed  LFT elevation: Relatively stable AST and ALT with normal Bili/AlkPhos. RUQ U/S unremarkable.  - Monitor intermittently.    NSVT: Stable. - Coreg increased on 9/2. - Optimize Mg and K. - Cardiology saw patient previously on 8/2   Oropharyngeal dysphagia - Continue tube feed 51m/hr and free water 1033mq4h via PEG. - SLP following   Hypokalemia: Resolved.  - Recheck q72h.   Tracheostomy complication/bleeding: In the setting of DAPT.  Resolved. -Tracheostomy revised by ENT   Small sacral decubitus prior to admission -  Continue monitoring  Diffuse  swelling, suspected to be due to inactivity. Not particularly responsive to lasix. Hypoalbuminemia contributing as well.  - Optimize nutrition.  Subjective: Whispers short answers and has no issues to speak of today.   Objective: Vitals:   09/20/21 0500 09/20/21 0716 09/20/21 0826 09/20/21 1221  BP:  (!) 172/98    Pulse:  90 84 87  Resp:  (!) 23 (!) 33 (!) 31  Temp:  98.8 F (37.1 C)    TempSrc:      SpO2:  93% 90% 93%  Weight: 85.7 kg     Height:       Gen: Chronically ill-appearing male in no distress Pulm: Nonlabored breathing thru trach. Rhonchi. Trach with copious secretions. CV: Regular rate and rhythm. Narrow complex. No murmur, rub, or gallop. No JVD, no pitting dependent edema. GI: Abdomen soft, non-tender, non-distended, with normoactive bowel sounds.  Ext: Warm, no deformities Skin: No new rashes, lesions or ulcers on visualized skin. PEG site c/d/i Neuro: Alert, oriented, without movement below neck.  Psych: Judgement and insight appear fair. Mood euthymic & affect congruent. Behavior is appropriate.    Data Personally reviewed: CBC: Recent Labs  Lab 09/14/21 0140 09/15/21 0256  WBC 7.8 8.4  HGB 12.2* 13.2  HCT 36.9* 39.3  MCV 85.2 84.2  PLT 318 379   Basic Metabolic Panel: Recent Labs  Lab 09/14/21 0140 09/15/21 0256 09/18/21 0620  NA 134* 137 135  K 3.3* 3.8 3.6  CL 98 100 100  CO2 '27 27 26  '$ GLUCOSE 140* 164* 148*  BUN '16 16 13  '$ CREATININE 0.59* 0.60* 0.50*  CALCIUM 8.7* 9.1 8.8*   GFR: Estimated Creatinine Clearance: 94.9 mL/min (A) (by C-G formula based on SCr of 0.5 mg/dL (L)). Liver Function Tests: Recent Labs  Lab 09/18/21 0620  AST 49*  ALT 72*  ALKPHOS 41  BILITOT 0.5  PROT 6.2*  ALBUMIN 2.6*   Urine analysis:    Component Value Date/Time   COLORURINE RED (A) 08/06/2021 1811   APPEARANCEUR CLOUDY (A) 08/06/2021 1811   APPEARANCEUR Clear 01/22/2019 0000   LABSPEC 1.016 08/06/2021 1811   PHURINE 6.0 08/06/2021 1811    GLUCOSEU 50 (A) 08/06/2021 1811   HGBUR LARGE (A) 08/06/2021 1811   BILIRUBINUR NEGATIVE 08/06/2021 1811   BILIRUBINUR Negative 01/22/2019 0000   KETONESUR NEGATIVE 08/06/2021 1811   PROTEINUR 100 (A) 08/06/2021 1811   NITRITE NEGATIVE 08/06/2021 1811   LEUKOCYTESUR NEGATIVE 08/06/2021 1811   Family Communication: None at bedside  Disposition: Status is: Inpatient Remains inpatient appropriate because: Awaiting adequate placement at SNF [facility] denied LTAC). Patient is medically stable for discharge to appropriate venue. Planned Discharge Destination: Skilled nursing facility  Patrecia Pour, MD 09/20/2021 12:50 PM Page by [email or web address]

## 2021-09-20 NOTE — Progress Notes (Signed)
Pharmacy Antibiotic Note  Joshua Donovan is a 66 y.o. male admitted on 08/03/2021 with stroke now on day 42 of hospital admission.  Pharmacy has been consulted for cefazolin dosing for MSSA HAP.  Renal function is stable with CrCl >60 mL/min.  Plan: Continue cefazolin 2g Q8H x 7 days Monitor renal function for dose adjustments   Height: '5\' 7"'$  (170.2 cm) Weight: 85.7 kg (188 lb 15 oz) IBW/kg (Calculated) : 66.1  Temp (24hrs), Avg:98.4 F (36.9 C), Min:98 F (36.7 C), Max:98.9 F (37.2 C)  Recent Labs  Lab 09/14/21 0140 09/15/21 0256 09/18/21 0620  WBC 7.8 8.4  --   CREATININE 0.59* 0.60* 0.50*     Estimated Creatinine Clearance: 94.9 mL/min (A) (by C-G formula based on SCr of 0.5 mg/dL (L)).    Allergies  Allergen Reactions   Lisinopril Anaphylaxis and Swelling    Angioedema (filled but not taking per family, 08/08/21).  TDD.   Anchovies [Fish Allergy] Swelling   Sulfa Antibiotics Swelling    Antimicrobials this admission: CTX 7/28 >> 7/30, 7/31 >> 8/2 Cefepime 7/30 >> 7/31, 8/3 >> 8/6 Cefazolin 9/5 >>  Microbiology results: 7/28 TA - GNR on Gram stain 7/28 TA bronch - AmpC Enterobacter aerogenes  8/28 TA: MSSA, Strep (beta-hemolytic)  Cristela Felt, PharmD, BCPS Clinical Pharmacist 09/20/2021 1:14 PM

## 2021-09-20 NOTE — Progress Notes (Signed)
Physical Therapy Treatment Patient Details Name: Joshua Donovan MRN: [id number] DOB: 1955-02-15 Today's Date: 09/20/2021   History of Present Illness Pt is a 66 y.o. male who presented 08/02/21 with R foot pain and R-sided weakness. Transferred to [facility] 7/25. 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 S/p stent placement to the R vertebrobasilar junction 7/26, failed extubation. Trach and PEG placed 7/28. S/p bronchoscopic evaluation, oral reintubation and tracheostomy removal with stoma packing 7/31 due to trach site bleeding. S/p trach revision 8/2. PMH: DM, GERD, TBI with prior ICH, HLD, HTN    PT Comments    Patient progressing slowly.  Noting some bilat UE involuntary extension when placed in chair via lift.  Unable to elicit actively during ROM activities.  Patient with brighter mood/affect today.  He was easier to position in chair today with trunk and head/neck extension.  He remains appropriate for LTACH at d/c.    Recommendations for follow up therapy are one component of a multi-disciplinary discharge planning process, led by the attending physician.  Recommendations may be updated based on patient status, additional functional criteria and insurance authorization.  Follow Up Recommendations  PT at Long-term acute care hospital Can patient physically be transported by private vehicle: No   Assistance Recommended at Discharge Frequent or constant Supervision/Assistance  Patient can return home with the following Assistance with cooking/housework;Two people to help with walking and/or transfers;Two people to help with  bathing/dressing/bathroom;Assistance with feeding;Direct supervision/assist for medications management;Direct supervision/assist for financial management;Assist for transportation;Help with stairs or ramp for entrance   Equipment Recommendations  Other (comment) (TBA)    Recommendations for Other Services       Precautions / Restrictions Precautions Precautions: Fall Precaution Comments: trach collar, peg, SBP < 180, prevalon boots, copious trach secretions, watch HR     Mobility  Bed Mobility Overal bed mobility: Needs Assistance Bed Mobility: Rolling Rolling: Total assist, +2 for physical assistance, +2 for safety/equipment         General bed mobility comments: rolling with A for all aspects to place lift pad (pt cued to try to turn head, but turns eyes only)    Transfers Overall transfer level: Needs assistance   Transfers: Bed to chair/wheelchair/BSC             General transfer comment: lifted to tilt in space with roho cushion.  Positioning for comfort and upright tolerance with two pillows angled in toward back in "V" shape and pillow under neck below headrest and towel roll to L side of head and rolled bed pad under head; bed pads along both calves to cushion leg rest hangers and towel rolls under feet, pillows under arms. Transfer via Lift Equipment: Maximove  Ambulation/Gait                   Stairs             Wheelchair Mobility    Modified Rankin (Stroke Patients Only) Modified Rankin (Stroke Patients Only) Pre-Morbid Rankin Score: No symptoms Modified Rankin: Severe disability     Balance Overall balance assessment: Needs assistance   Sitting balance-Leahy Scale: Zero Sitting balance - Comments: repositioned in w/c for symmetry with  total A.                                    Cognition Arousal/Alertness: Awake/alert Behavior During Therapy: WFL for tasks assessed/performed Overall Cognitive Status: Difficult to  assess                                 General Comments: responds with yes/no well, difficulty with eye gaze communication device        Exercises Other Exercises Other Exercises: PROM LE's and UE's in supine    General Comments General comments (skin integrity, edema, etc.): on trach collar 28% FiO2 with VSS; noted pt able to extend neck, and noted some bilat UE extension with arms in his lap, but unable to reproduce to command      Pertinent Vitals/Pain Pain Assessment Pain Assessment: Faces Faces Pain Scale: No hurt    Home Living                          Prior Function            PT Goals (current goals can now be found in the care plan section) Progress towards PT goals: Progressing toward goals (slowly)    Frequency    Min 3X/week      PT Plan Current plan remains appropriate    Co-evaluation              AM-PAC PT "6 Clicks" Mobility   Outcome Measure  Help needed turning from your back to your side while in a flat bed without using bedrails?: Total Help needed moving from lying on your back to sitting on the side of a flat bed without using bedrails?: Total Help needed moving to and from a bed to a chair (including a wheelchair)?: Total Help needed standing up from a chair using your arms (e.g., wheelchair or bedside chair)?: Total Help needed to walk in hospital room?: Total Help needed climbing 3-5 steps with a railing? : Total 6 Click Score: 6    End of Session Equipment Utilized During Treatment: Oxygen Activity Tolerance: Patient tolerated treatment well Patient left: in chair;with call bell/phone within reach;with chair alarm set   PT Visit Diagnosis: Muscle weakness (generalized) (M62.81);Other symptoms and signs involving the nervous system (R29.898);Other abnormalities of gait and mobility (R26.89);Other (comment) Hemiplegia - Right/Left:  (quadriparesis)     Time: 7209-4709 PT Time Calculation (min) (ACUTE  ONLY): 32 min  Charges:  $Therapeutic Activity: 23-37 mins                     Joshua Donovan, PT Acute Rehabilitation Services Office:[phone] 09/20/2021    Joshua Donovan 09/20/2021, 5:36 PM

## 2021-09-20 NOTE — Progress Notes (Signed)
Mobility Specialist Progress Note   09/20/21 1811  Mobility  Activity Transferred from chair to bed  Level of Assistance +2 (takes two people) Educational psychologist)  Assistive Device MaxiMove  Activity Response Tolerated well  $Mobility charge 1 Mobility   Pt able to communicate they were ready to get back to bed d/t fatigue. Transferred pt from chair to bed w/o fault. Left in bed w/ needs met.    Holland Falling Mobility Specialist [facility] #:  [phone] Acute Rehab Office:  [phone]

## 2021-09-21 LAB — COMPREHENSIVE METABOLIC PANEL
ALT: 91 U/L — ABNORMAL HIGH (ref 0–44)
AST: 65 U/L — ABNORMAL HIGH (ref 15–41)
Albumin: 2.6 g/dL — ABNORMAL LOW (ref 3.5–5.0)
Alkaline Phosphatase: 47 U/L (ref 38–126)
Anion gap: 9 (ref 5–15)
BUN: 14 mg/dL (ref 8–23)
CO2: 25 mmol/L (ref 22–32)
Calcium: 8.9 mg/dL (ref 8.9–10.3)
Chloride: 103 mmol/L (ref 98–111)
Creatinine, Ser: 0.58 mg/dL — ABNORMAL LOW (ref 0.61–1.24)
GFR, Estimated: >60 mL/min (ref >60)
Glucose, Bld: 191 mg/dL — ABNORMAL HIGH (ref 70–99)
Potassium: 4.4 mmol/L (ref 3.5–5.1)
Sodium: 137 mmol/L (ref 135–145)
Total Bilirubin: 0.3 mg/dL (ref 0.3–1.2)
Total Protein: 6.2 g/dL — ABNORMAL LOW (ref 6.5–8.1)

## 2021-09-21 LAB — GLUCOSE, CAPILLARY
Glucose-Capillary: 133 mg/dL — ABNORMAL HIGH (ref 70–99)
Glucose-Capillary: 164 mg/dL — ABNORMAL HIGH (ref 70–99)
Glucose-Capillary: 171 mg/dL — ABNORMAL HIGH (ref 70–99)
Glucose-Capillary: 139 mg/dL — ABNORMAL HIGH (ref 70–99)
Glucose-Capillary: 164 mg/dL — ABNORMAL HIGH (ref 70–99)
Glucose-Capillary: 145 mg/dL — ABNORMAL HIGH (ref 70–99)

## 2021-09-21 NOTE — TOC Progression Note (Signed)
Transition of Care [facility]) - Progression Note    Patient Details  Name: Joshua Donovan MRN: [id number] Date of Birth: [birth year]  Transition of Care [facility]) CM/SW [place], [place] Phone Number: 09/21/2021, 12:31 PM  Clinical Narrative:    Called patient's daughter,[name], left voice message to return call.   Expected Discharge Plan: Long Term Acute Care (LTAC) Barriers to Discharge: Continued Medical Work up  Expected Discharge Plan and Services Expected Discharge Plan: Long Term Acute Care (LTAC)   Discharge Planning Services: CM Consult Post Acute Care Choice: Long Term Acute Care (LTAC) Living arrangements for the past 2 months: Single Family Home                                       Social Determinants of Health (SDOH) Interventions    Readmission Risk Interventions     No data to display

## 2021-09-21 NOTE — Progress Notes (Signed)
Progress Note  Patient: Joshua Donovan [id number]:[id number] DOB: 10/12/1955  DOA: 08/03/2021  DOS: 09/21/2021    Brief hospital course: Patient with history of ICH, type II DM, HTN, HLD presented to [facility] hospital on 7/24 with right hand numbness and weakness, initially found to have a small brainstem stroke and ventral medulla.  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  He was intubated and transferred to [facility] for interventional radiology intervention.  Remained in the intensive care unit.  Now with tracheostomy and PEG tube placement.  Pending LTAC transfer.  7/24 presented to [facility], ventral medulla CVA 7/25 tx to [facility], treated with Cleviprex. 7/26 cerebral angiogram with stent placement to right vertebro-basilar junction, failed extubation, MRI brain> mild extension of stroke, now involving bilateral medial medullary, patent basilar artery and R VBJ stent  7/28 bedside tracheostomy by PCCM and PEG tube placement by general surgery 7/30 Hypertension persist despite adding oral agents, glucose remains elevated as well 7/31 Bleeding around trach site, bright red blood.  Underwent bronchoscopy, Trach Removed, patient orally reintubated, stoma packed. 8/2 ENT took patient to OR for trach redo 8/9 transferred to medical floor on tracheostomy now. 8/12 vomiting after bolus feeding with worsening hypoxia. 8/13 copious bleeding from the heparin injection site controlled with pressure dressing. 8/14 bleeding has resolved.  Trach secretions blood-tinged. 8/15 insurance denied LTAC appeal.  CSW working on SNF. 8/16>8/22: 25 beat run of NSVT on 8/21 8/28: peer to peer for LTAC looks like this was denied- Interval x-ray showed basilar opacities--respiratory culture ordered, hypertonic saline started scopolamine discontinued 8/29: V. tach runs-increased metoprolol Resolved 9/2: Slightly increased work of breathing-low-grade temps 99 for chest x-ray showed no  interval changes 9/5:  Ancef started 2/2 secretions --no wbc or fever  Assessment and Plan: Acute stroke of medulla oblongata: Initially loaded with aspirin and Brilinta.  Stent to right vertebrobasilar junction on 7/26.  Still with dense quadriplegia - Continue aspirin, brilinta and lipitor - Neurology signed off on 8/4. Will need outpatient follow up. - Continue PT/OT/SLP > seeking SNF   Acute respiratory failure with hypoxia due to aspiration pneumonia/hospital-acquired MSSA pneumonia.  CXR on 9/3 shows bibasilar atelectasis/infiltrate.  Concern about Enterobacter/MSSA HAP - Tracheostomy since 7/31, exchanged 9/8. Followed by PCCM. - Continue ancef 9/15 - 9/12. Repeat CXR 9/12 shows only atelectasis. - Continue hypertonic saline nebs and chest physiotherapy per PCCM. - Continue scopolamine patch for secretions, if too thick, consider stopping. - Trach care per respiratory and PCCM.  - Pulmonary toilet   Alcohol abuse:   - Continue thiamine, multivitamin and folic acid   Uncontrolled DM-2 with hyperglycemia, and hyperlipidemia: A1c 8.3% on 7/25.  - Continue levemir 30u BID  - Continue NovoLog 10 units + SSI every 4 hours - Continue statin. - Further adjustment as appropriate. At inpatient goal.   Essential hypertension: Better controlled but may consider additional agent e.g. scheduled hydralazine. - Continue amlodipine '10mg'$  daily, coreg 37.'5mg'$  BID - Increased HCTZ to '25mg'$  daily  - IV labetalol as needed  LFT elevation: Relatively stable AST and ALT with normal Bili/AlkPhos. RUQ U/S unremarkable.  - Monitor intermittently.    NSVT: Stable. - Coreg increased on 9/2. - Optimize Mg and K. - Cardiology saw patient previously on 8/2   Oropharyngeal dysphagia - Continue tube feed 35m/hr and free water 1040mq4h via PEG. - SLP following   Hypokalemia: Resolved.  - Recheck q72h. Pending this AM.   Tracheostomy complication/bleeding: In the setting of DAPT.  Resolved. -Tracheostomy revised by ENT   Small sacral decubitus prior to admission - Continue monitoring  Diffuse swelling, suspected to be due to inactivity. Not particularly responsive to lasix. Hypoalbuminemia contributing as well.  - Optimize nutrition.  Subjective: No new complaints.   Objective: Vitals:   09/21/21 0400 09/21/21 0500 09/21/21 0730 09/21/21 0814  BP:   (!) 165/96   Pulse:   87 90  Resp: (!) 22  (!) 32 (!) 30  Temp:   98 F (36.7 C)   TempSrc:   Oral   SpO2:   97% 97%  Weight:  85.7 kg    Height:       Gen: 66 y.o. male in no distress Pulm: Nonlabored breathing thru trach. Clear at bases. CV: Regular rate and rhythm. No murmur, rub, or gallop. No JVD, trace diffuse edema. GI: Abdomen soft, non-tender, non-distended, with normoactive bowel sounds.  Ext: Warm, no deformities Skin: PEG site c/d/i without new rashes, lesions or ulcers on visualized skin. Neuro: Alert and oriented. Stable quadriplegia without new focal neurological deficits. Psych: Judgement and insight appear fair. Mood euthymic & affect congruent. Behavior is appropriate.    Data Personally reviewed: CBC: Recent Labs  Lab 09/15/21 0256  WBC 8.4  HGB 13.2  HCT 39.3  MCV 84.2  PLT 423   Basic Metabolic Panel: Recent Labs  Lab 09/15/21 0256 09/18/21 0620  NA 137 135  K 3.8 3.6  CL 100 100  CO2 27 26  GLUCOSE 164* 148*  BUN 16 13  CREATININE 0.60* 0.50*  CALCIUM 9.1 8.8*   GFR: Estimated Creatinine Clearance: 94.9 mL/min (A) (by C-G formula based on SCr of 0.5 mg/dL (L)). Liver Function Tests: Recent Labs  Lab 09/18/21 0620  AST 49*  ALT 72*  ALKPHOS 41  BILITOT 0.5  PROT 6.2*  ALBUMIN 2.6*   Urine analysis:    Component Value Date/Time   COLORURINE RED (A) 08/06/2021 1811   APPEARANCEUR CLOUDY (A) 08/06/2021 1811   APPEARANCEUR Clear 01/22/2019 0000   LABSPEC 1.016 08/06/2021 1811   PHURINE 6.0 08/06/2021 1811   GLUCOSEU 50 (A) 08/06/2021 1811   HGBUR LARGE  (A) 08/06/2021 1811   BILIRUBINUR NEGATIVE 08/06/2021 1811   BILIRUBINUR Negative 01/22/2019 0000   KETONESUR NEGATIVE 08/06/2021 1811   PROTEINUR 100 (A) 08/06/2021 1811   NITRITE NEGATIVE 08/06/2021 1811   LEUKOCYTESUR NEGATIVE 08/06/2021 1811   Family Communication: None at bedside  Disposition: Status is: Inpatient Remains inpatient appropriate because: Awaiting adequate placement at SNF [facility] denied LTAC). Patient is medically stable for discharge to appropriate venue. Planned Discharge Destination: Skilled nursing facility  Patrecia Pour, MD 09/21/2021 10:47 AM Page by [email or web address]

## 2021-09-21 NOTE — Progress Notes (Signed)
Speech Language Pathology Treatment: Cognitive-Linquistic;Passy Muir Speaking valve  Patient Details Name: Joshua Donovan MRN: [id number] DOB: 02-06-1955 Today's Date: 09/21/2021 Time: 4481-8563 SLP Time Calculation (min) (ACUTE ONLY): 22 min  Assessment / Plan / Recommendation Clinical Impression  Pt demonstrated much improved attention today, did not have any trouble with his secretions and was pleasant and willing to participate. Pt repositioned, device placed almost directly at his gaze level. Pt unable to select phrases on top row of 4/3 grid so the device was tilted a bit, turned lights on, both of which helped. Pts fine motor control of his eye gaze does not appear refined enough for more articulate selections. However, with assist pt did select each of the 11 buttons that were indicated. May improve with a shorter dwell time in future attempt. SLP again reviewed the choices and asked the pt to respond Y/N do you want to tell me something you need from this board? He articulated Yes and selected "I need to use the bathroom." SLP called the NT and pt requested the bedpan. While waiting for NT, SLP placed the PMSV and pt tolerated placement for several minutes. Pt able to redirect air comfortably and had a soft throat clear. Pt did not achieve any further phonation, but attempts were brief. Will continue to work on this as voicing seems promising. Communicated pts success today to his daughter Joshua Donovan and asked her to contact me if she is present at the hospital this week so we can work together with Joshua Donovan.   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 He  presented 08/02/21 with R foot pain and R-sided weakness. Transferred to [facility] 7/25. MRI 7/26 revealed interval expansion of previously identified ventral medullary infarct, extending posteriorly to traverse the medulla to the floor of the fourth ventricle, new scattered  small volume ischemic infarcts involving the right cerebellum as well  as the cortical aspects of the right greater than left occipital lobes, and single punctate focus of associated petechial hemorrhage at the right cerebellum. S/p stent placement to the R vertebrobasilar junction 7/26, failed extubation. Trach and PEG placed 7/28. PMH: DM, GERD, TBI with prior ICH, HLD, HTN      SLP Plan  Continue with current plan of care      Recommendations for follow up therapy are one component of a multi-disciplinary discharge planning process, led by the attending physician.  Recommendations may be updated based on patient status, additional functional criteria and insurance authorization.    Recommendations         Patient may use Passy-Muir Speech Valve: with SLP only PMSV Supervision: Full         Follow Up Recommendations: Skilled nursing-short term rehab (<3 hours/day) Assistance recommended at discharge: Frequent or constant Supervision/Assistance SLP Visit Diagnosis: Dysarthria and anarthria (R47.1) Plan: Continue with current plan of care           Kaede Clendenen, [name]  09/21/2021, 11:14 AM

## 2021-09-21 NOTE — TOC Progression Note (Addendum)
Transition of Care [facility]) - Progression Note    Patient Details  Name: Joshua Donovan MRN: [id number] Date of Birth: [birth year]  Transition of Care [facility]) CM/SW [place], [place] Phone Number: 09/21/2021, 10:05 AM  Clinical Narrative:     Patient has no bed offers at this time- [facility] does not have availability-  TOC will continue to follow and assist with discharge planning.  Thurmond Butts, MSW, LCSW Clinical Social Worker    Expected Discharge Plan: Long Term Acute Care (LTAC) Barriers to Discharge: Continued Medical Work up  Expected Discharge Plan and Services Expected Discharge Plan: Long Term Acute Care (LTAC)   Discharge Planning Services: CM Consult Post Acute Care Choice: Long Term Acute Care (LTAC) Living arrangements for the past 2 months: Single Family Home                                       Social Determinants of Health (SDOH) Interventions    Readmission Risk Interventions     No data to display

## 2021-09-22 LAB — GLUCOSE, CAPILLARY
Glucose-Capillary: 121 mg/dL — ABNORMAL HIGH (ref 70–99)
Glucose-Capillary: 125 mg/dL — ABNORMAL HIGH (ref 70–99)
Glucose-Capillary: 130 mg/dL — ABNORMAL HIGH (ref 70–99)
Glucose-Capillary: 139 mg/dL — ABNORMAL HIGH (ref 70–99)
Glucose-Capillary: 155 mg/dL — ABNORMAL HIGH (ref 70–99)
Glucose-Capillary: 162 mg/dL — ABNORMAL HIGH (ref 70–99)

## 2021-09-22 NOTE — Progress Notes (Signed)
Physical Therapy Treatment Patient Details Name: Joshua Donovan MRN: [id number] DOB: [birth year] Today's Date: 09/22/2021   History of Present Illness Pt is a 66 y.o. male who presented 08/02/21 with R foot pain and R-sided weakness. Transferred to [facility] 7/25. 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 S/p stent placement to the R vertebrobasilar junction 7/26, failed extubation. Trach and PEG placed 7/28. S/p bronchoscopic evaluation, oral reintubation and tracheostomy removal with stoma packing 7/31 due to trach site bleeding. S/p trach revision 8/2. PMH: DM, GERD, TBI with prior ICH, HLD, HTN    PT Comments    Patient up to EOB today with +2 A and noted extension of L LE and bilat UE's in response to cervical extension, but unable to perform voluntarily.  He tolerated cervical stretches at EOB and supine PROM and stretching except painful with hand and wrist movements with edema noted.  Pt remains appropriate for LTACH.  PT will continue to follow.    Recommendations for follow up therapy are one component of a multi-disciplinary discharge planning process, led by the attending physician.  Recommendations may be updated based on patient status, additional functional criteria and insurance authorization.  Follow Up Recommendations  PT at Long-term acute care hospital     Assistance Recommended at Discharge Frequent or constant Supervision/Assistance  Patient can return home with the following Assistance with cooking/housework;Two people to help with walking and/or transfers;Two people to help with bathing/dressing/bathroom;Assistance  with feeding;Direct supervision/assist for medications management;Direct supervision/assist for financial management;Assist for transportation;Help with stairs or ramp for entrance   Equipment Recommendations  Other (comment) (TBA)    Recommendations for Other Services       Precautions / Restrictions Precautions Precautions: Fall Precaution Comments: trach collar, peg, SBP < 180, prevalon boots, copious trach secretions, watch HR Restrictions Weight Bearing Restrictions: No     Mobility  Bed Mobility Overal bed mobility: Needs Assistance Bed Mobility: Supine to Sit     Supine to sit: HOB elevated, Total assist, +2 for physical assistance     General bed mobility comments: up to EOB for work on sitting balance    Transfers Overall transfer level: Needs assistance   Transfers: Bed to chair/wheelchair/BSC             General transfer comment: assist for placing lift pad while in sitting and working on head control and noting LE and UE extensor tone at times with head extension Transfer via Lift Equipment: Maximove  Ambulation/Gait                   Stairs             Wheelchair Mobility    Modified Rankin (Stroke Patients Only) Modified Rankin (Stroke Patients Only) Pre-Morbid Rankin Score: No symptoms Modified Rankin: Severe disability     Balance Overall balance assessment: Needs assistance Sitting-balance support: Feet supported Sitting balance-Leahy Scale: Zero Sitting balance - Comments: max A for sitting EOB; positioned for comfort in tilt in space wheelchair  Cognition Arousal/Alertness: Awake/alert Behavior During Therapy: WFL for tasks assessed/performed Overall Cognitive Status: Difficult to assess                         Following Commands: Follows one step commands consistently       General Comments: responds with yes/no well, difficulty with eye gaze  communication device        Exercises Other Exercises Other Exercises: sitting balance/tolerance for ~8 minutes Other Exercises: cervical rotation, flexion, extension PROM stretching Other Exercises: PROM UE and LE in supine prior to sitting up    General Comments General comments (skin integrity, edema, etc.): on trach collar 28% VSS      Pertinent Vitals/Pain Pain Assessment Pain Assessment: Faces Faces Pain Scale: Hurts even more Pain Location: grimacing with finger/wrist flexion/extension ROM Pain Descriptors / Indicators: Discomfort, Grimacing Pain Intervention(s): Monitored during session, Limited activity within patient's tolerance    Home Living                          Prior Function            PT Goals (current goals can now be found in the care plan section) Progress towards PT goals: Progressing toward goals (slowly)    Frequency    Min 3X/week      PT Plan Current plan remains appropriate    Co-evaluation       OT goals addressed during session: ADL's and self-care      AM-PAC PT "6 Clicks" Mobility   Outcome Measure  Help needed turning from your back to your side while in a flat bed without using bedrails?: Total Help needed moving from lying on your back to sitting on the side of a flat bed without using bedrails?: Total Help needed moving to and from a bed to a chair (including a wheelchair)?: Total Help needed standing up from a chair using your arms (e.g., wheelchair or bedside chair)?: Total Help needed to walk in hospital room?: Total Help needed climbing 3-5 steps with a railing? : Total 6 Click Score: 6    End of Session Equipment Utilized During Treatment: Oxygen Activity Tolerance: Patient tolerated treatment well Patient left: in chair;with call bell/phone within reach;with chair alarm set Nurse Communication: Mobility status;Other (comment) PT Visit Diagnosis: Muscle weakness (generalized) (M62.81);Other symptoms  and signs involving the nervous system (R29.898);Other abnormalities of gait and mobility (R26.89);Other (comment) Hemiplegia - caused by: Cerebral infarction     Time: 1322-1409 PT Time Calculation (min) (ACUTE ONLY): 47 min  Charges:  $Therapeutic Activity: 8-22 mins                     Joshua Donovan, PT Acute Rehabilitation Services [phone] 09/22/2021    [name] 09/22/2021, 4:24 PM

## 2021-09-22 NOTE — Progress Notes (Addendum)
Nutrition Follow-up  DOCUMENTATION CODES:   Not applicable  INTERVENTION:   Continue TF via PEG: Jevity 1.5 at 60 ml/h (1440 ml per day) Prosource TF20 60 ml once daily  Provides 2240 kcal, 112 gm protein, 1094 ml free water daily.  Free water flushes 100 ml every 4 hours for a total of 1694 ml free water daily. Adjust as needed based on sodium level and fluid status.   Banatrol TF 60 ml BID.  MVI with minerals 1 tablet daily via tube.   NUTRITION DIAGNOSIS:   Inadequate oral intake related to acute illness as evidenced by NPO status.  Ongoing   GOAL:   Patient will meet greater than or equal to 90% of their needs  Met with TF via PEG  MONITOR:   Vent status, Labs, Weight trends, TF tolerance  REASON FOR ASSESSMENT:   Consult Enteral/tube feeding initiation and management  ASSESSMENT:   66 yo male admitted post brain stem stroke involving ventral medulla requiring stent placement to right vertebro-basilar junction. PMH includes HTN, TBI, DM, HLD  Patient remains on trach collar and NPO. SLP is working with patient on PMSV use; not evaluated for POs yet. PEG in place and patient is tolerating TF at goal rate to meet nutrition needs. He is receiving Banatrol fiber supplement BID. LBM large type 6 overnight.   TOC team working on SNF placement, no bed offers at this time.   Labs reviewed.  CBG: 162-130  Medications reviewed and include Banatrol TF, folic acid, Novolog, Levemir, MVI with minerals, Klor-con, thiamine.  Weight stable for the past week at 84-85 kg. Admission weight 89.1 kg. Decrease in weight since admission can be attributed to net negative fluid balance since admission.  Net IO Since Admission: -3,701.56 mL [09/22/21 1119]  Diet Order:   Diet Order             Diet NPO time specified  Diet effective midnight                   EDUCATION NEEDS:   Not appropriate for education at this time  Skin:  Skin Assessment: Skin Integrity  Issues: Skin Integrity Issues:: Other (Comment) Other: non-pressure wound bilateral buttock  Last BM:  9/13 large type 6  Height:   Ht Readings from Last 1 Encounters:  08/04/21 _0  (1.702 m)    Weight:   Wt Readings from Last 1 Encounters:  09/22/21 85.7 kg    BMI:  Body mass index is 29.59 kg/m.  Estimated Nutritional Needs:   Kcal:  2000-2200 kcals  Protein:  100-115 g  Fluid:  >/= 2 L   Lucas Mallow RD, LDN, CNSC Please refer to Amion for contact information.

## 2021-09-22 NOTE — Progress Notes (Signed)
Occupational Therapy Treatment Patient Details Name: Joshua Donovan MRN: [id number] DOB: [birth year] Today's Date: 09/22/2021   History of present illness Pt is a 66 y.o. male who presented 08/02/21 with R foot pain and R-sided weakness. Transferred to [facility] 7/25. 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 S/p stent placement to the R vertebrobasilar junction 7/26, failed extubation. Trach and PEG placed 7/28. S/p bronchoscopic evaluation, oral reintubation and tracheostomy removal with stoma packing 7/31 due to trach site bleeding. S/p trach revision 8/2. PMH: DM, GERD, TBI with prior ICH, HLD, HTN   OT comments  [place] continues to work towards his goals. Overall he remains total A for all aspects of his care. He tolerated PROM in supine, BUEs noted to be swollen and are painful for wrist and hand ROM. Pt sat EOB with min-total A for support and tolerated cervical PROM. Pt had some BUE extension with cervical extension. OT to continue to follow acutely. POC remains appropriate.    Recommendations for follow up therapy are one component of a multi-disciplinary discharge planning process, led by the attending physician.  Recommendations may be updated based on patient status, additional functional criteria and insurance authorization.    Follow Up Recommendations  OT at Long-term acute care hospital    Assistance Recommended at Discharge Frequent or constant Supervision/Assistance  Patient can return home with the following  Two people to help with walking and/or transfers;Two people to help with bathing/dressing/bathroom;Assistance with  cooking/housework;Assistance with feeding;Direct supervision/assist for medications management;Direct supervision/assist for financial management;Assist for transportation;Help with stairs or ramp for entrance   Equipment Recommendations  Other (comment)    Recommendations for Other Services      Precautions / Restrictions Precautions Precautions: Fall Precaution Comments: trach collar, peg, SBP < 180, prevalon boots, copious trach secretions, watch HR Restrictions Weight Bearing Restrictions: No       Mobility Bed Mobility Overal bed mobility: Needs Assistance Bed Mobility: Supine to Sit     Supine to sit: Total assist, +2 for physical assistance, +2 for safety/equipment          Transfers Overall transfer level: Needs assistance Equipment used: Ambulation equipment used Transfers: Bed to chair/wheelchair/BSC               Transfer via Lift Equipment: Maximove   Balance Overall balance assessment: Needs assistance Sitting-balance support: No upper extremity supported, Feet supported Sitting balance-Leahy Scale: Zero                                     ADL either performed or assessed with clinical judgement   ADL Overall ADL's : Needs assistance/impaired                         Toilet Transfer: Total assistance;+2 for physical assistance;+2 for safety/equipment Toilet Transfer Details (indicate cue type and reason): simulated         Functional mobility during ADLs: Total assistance;+2 for physical assistance General ADL Comments: bed level PROM, sitting EOB tolerance and stretching, hoyer lift to tilt in space recliner    Extremity/Trunk Assessment  Upper Extremity Assessment Upper Extremity Assessment: RUE deficits/detail;LUE deficits/detail RUE Deficits / Details: unable to obtain full PROM of hand and wirst, pt grimancing with pain. Increased edema. Noted shoulder flexion with cervical extension RUE Sensation: WNL RUE  Coordination: decreased fine motor;decreased gross motor LUE Deficits / Details: unable to obtain full PROM of hand and wirst, pt grimancing with pain. Increased edema. Noted shoulder flexion with cervical extension LUE Sensation: WNL LUE Coordination: decreased fine motor;decreased gross motor   Lower Extremity Assessment Lower Extremity Assessment: Defer to PT evaluation        Vision   Vision Assessment?: Yes Additional Comments: minimal vertical nystagmus this session   Perception Perception Perception: Not tested   Praxis Praxis Praxis: Not tested    Cognition Arousal/Alertness: Awake/alert Behavior During Therapy: WFL for tasks assessed/performed Overall Cognitive Status: Difficult to assess                         Following Commands: Follows one step commands consistently       General Comments: responds with yes/no well, difficulty with eye gaze communication device        Exercises Exercises: Other exercises General Exercises - Upper Extremity Elbow Flexion: PROM, Both, 10 reps, Supine Elbow Extension: PROM, Both, 10 reps, Supine Wrist Flexion: PROM, Both, 10 reps, Supine Wrist Extension: PROM, Both, 10 reps, Supine Digit Composite Flexion: PROM, Both, 10 reps, Seated Composite Extension: PROM, Both, 10 reps, Seated Other Exercises Other Exercises: sitting balance/tolerance for ~8 minutes Other Exercises: cervical rotation, flexion, extension PROM stretching    Shoulder Instructions       General Comments on trach collar 28% FiO2 with VSS; noted pt able to extend neck, and noted some bilat UE extension with arms in his lap, but unable to reproduce to command    Pertinent Vitals/ Pain       Pain Assessment Pain Assessment: Faces Pain Location: grimacing with finger/wrist flexion/extension ROM Pain Descriptors / Indicators: Discomfort, Grimacing Pain Intervention(s): Limited activity within patient's tolerance, Monitored during  session  Home Living                                          Prior Functioning/Environment              Frequency  Min 2X/week        Progress Toward Goals  OT Goals(current goals can now be found in the care plan section)  Progress towards OT goals: Progressing toward goals  Acute Rehab OT Goals Patient Stated Goal: not stated OT Goal Formulation: Patient unable to participate in goal setting Time For Goal Achievement: 09/29/21 Potential to Achieve Goals: Fair ADL Goals Pt/caregiver will Perform Home Exercise Program: Both right and left upper extremity;With written HEP provided Additional ADL Goal #1: Pt's family will be educated about bed positioning to avoid pressure sores and increase care of skin Additional ADL Goal #2: Pt will use soft touch call bell to call nurses by moving head with min assist. Additional ADL Goal #3: Therapist to explore other options for call bell use if pt unable to move head to use soft touch system.  Plan Discharge plan remains appropriate    Co-evaluation    PT/OT/SLP Co-Evaluation/Treatment: Yes     OT goals addressed during session: ADL's and self-care      AM-PAC OT "6 Clicks" Daily Activity  Outcome Measure   Help from another person eating meals?: Total Help from another person taking care of personal grooming?: Total Help from another person toileting, which includes using toliet, bedpan, or urinal?: Total Help from another person bathing (including washing, rinsing, drying)?: Total Help from another person to put on and taking off regular upper body clothing?: Total Help from another person to put on and taking off regular lower body clothing?: Total 6 Click Score: 6    End of Session Equipment Utilized During Treatment: Oxygen  OT Visit Diagnosis: Other symptoms and signs involving the nervous system (R29.898)   Activity Tolerance Patient tolerated treatment well   Patient Left in chair;with  call bell/phone within reach;with chair alarm set   Nurse Communication Mobility status        Time: 8875-7972 OT Time Calculation (min): 47 min  Charges: OT General Charges $OT Visit: 1 Visit OT Treatments $Therapeutic Activity: 23-37 mins    Elliot Cousin 09/22/2021, 3:15 PM

## 2021-09-22 NOTE — Progress Notes (Signed)
Progress Note  Patient: Joshua Donovan UUV:[id number] DOB: [birth year]  DOA: 08/03/2021  DOS: 09/22/2021    Brief hospital course: Patient with history of ICH, type II DM, HTN, HLD presented to [facility] on 7/24 with right hand numbness and weakness, initially found to have a small brainstem stroke and ventral medulla.  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  He was intubated and transferred to [facility] for interventional radiology intervention.  Remained in the intensive care unit.  Now with tracheostomy and PEG tube placement.  Pending LTAC transfer.  7/24 presented to [facility], ventral medulla CVA 7/25 tx to [facility], treated with Cleviprex. 7/26 cerebral angiogram with stent placement to right vertebro-basilar junction, failed extubation, MRI brain> mild extension of stroke, now involving bilateral medial medullary, patent basilar artery and R VBJ stent  7/28 bedside tracheostomy by PCCM and PEG tube placement by general surgery 7/30 Hypertension persist despite adding oral agents, glucose remains elevated as well 7/31 Bleeding around trach site, bright red blood.  Underwent bronchoscopy, Trach Removed, patient orally reintubated, stoma packed. 8/2 ENT took patient to OR for trach redo 8/9 transferred to medical floor on tracheostomy now. 8/12 vomiting after bolus feeding with worsening hypoxia. 8/13 copious bleeding from the heparin injection site controlled with pressure dressing. 8/14 bleeding has resolved.  Trach secretions blood-tinged. 8/15 insurance denied LTAC appeal.  CSW working on SNF. 8/16>8/22: 25 beat run of NSVT on 8/21 8/28: peer to peer for LTAC looks like this was denied- Interval x-ray showed basilar opacities--respiratory culture ordered, hypertonic saline started scopolamine discontinued 8/29: V. tach runs-increased metoprolol Resolved 9/2: Slightly increased work of breathing-low-grade temps 99 for chest x-ray showed no  interval changes 9/5:  Ancef started 2/2 secretions --no wbc or fever  Assessment and Plan: Acute stroke of medulla oblongata: Initially loaded with aspirin and Brilinta.  Stent to right vertebrobasilar junction on 7/26.  Still with dense quadriplegia - Continue aspirin, brilinta and lipitor - Neurology signed off on 8/4. Will need outpatient follow up. - Continue PT/OT/SLP > seeking SNF   Acute respiratory failure with hypoxia due to aspiration pneumonia/hospital-acquired MSSA pneumonia.  CXR on 9/3 shows bibasilar atelectasis/infiltrate.  Concern about Enterobacter/MSSA HAP - Tracheostomy since 7/31, exchanged 9/8. Followed by PCCM. - Completed ancef 9/15 - 9/12. Repeat CXR 9/12 showed only atelectasis. - Continue hypertonic saline nebs and chest physiotherapy per PCCM. - Continue scopolamine patch for secretions, if too thick, consider stopping. - Trach care per respiratory and PCCM.  - Pulmonary toilet   Alcohol abuse:   - Continue thiamine, multivitamin and folic acid   Uncontrolled DM-2 with hyperglycemia, and hyperlipidemia: A1c 8.3% on 7/25.  - Continue levemir 30u BID  - Continue NovoLog 10 units + SSI every 4 hours - Continue statin. - Further adjustment as appropriate. At inpatient goal.   Essential hypertension: Better controlled but may consider additional agent e.g. scheduled hydralazine. - Continue amlodipine '10mg'$  daily, coreg 37.'5mg'$  BID - Increased HCTZ to '25mg'$  daily  - IV labetalol as needed  LFT elevation: Relatively stable AST and ALT with normal Bili/AlkPhos. RUQ U/S unremarkable.  - Monitor intermittently.    NSVT: Stable. - Coreg increased on 9/2. - Optimize Mg and K. - Cardiology saw patient previously on 8/2   Oropharyngeal dysphagia - Continue tube feed 3m/hr and free water 1031mq4h via PEG. - SLP following   Hypokalemia: Resolved.  - Recheck q72h. Pending this AM.   Tracheostomy complication/bleeding: In the setting of DAPT.  Resolved. -Tracheostomy revised by ENT   Small sacral decubitus prior to admission - Continue monitoring  Diffuse swelling, suspected to be due to inactivity. Not particularly responsive to lasix. Hypoalbuminemia contributing as well.  - Optimize nutrition.  Subjective: No overnight events or new issues.  Objective: Vitals:   09/22/21 0459 09/22/21 0722 09/22/21 0730 09/22/21 1050  BP:   127/79   Pulse:  98 90 96  Resp:  (!) 28 (!) 25 (!) 28  Temp:   98.2 F (36.8 C)   TempSrc:   Oral   SpO2:  96% 97% 96%  Weight: 85.7 kg     Height:       Gen: 66 y.o. male in no distress Pulm: Nonlabored. Clear, RT at bedside suctioning. CV: Regular rate and rhythm. No murmur, rub, or gallop. No JVD, no pitting dependent edema. GI: Abdomen soft, non-tender, non-distended, with normoactive bowel sounds.  Ext: Warm, no deformities Skin: No rashes, lesions or ulcers on visualized skin. Neuro: Alert and oriented, stable quadriplegia. Psych: Judgement and insight appear fair. Mood euthymic & affect congruent. Behavior is appropriate.    Data Personally reviewed: Basic Metabolic Panel: Recent Labs  Lab 09/18/21 0620 09/21/21 0938  NA 135 137  K 3.6 4.4  CL 100 103  CO2 26 25  GLUCOSE 148* 191*  BUN 13 14  CREATININE 0.50* 0.58*  CALCIUM 8.8* 8.9   GFR: Estimated Creatinine Clearance: 94.9 mL/min (A) (by C-G formula based on SCr of 0.58 mg/dL (L)). Liver Function Tests: Recent Labs  Lab 09/18/21 0620 09/21/21 0938  AST 49* 65*  ALT 72* 91*  ALKPHOS 41 47  BILITOT 0.5 0.3  PROT 6.2* 6.2*  ALBUMIN 2.6* 2.6*   Urine analysis:    Component Value Date/Time   COLORURINE RED (A) 08/06/2021 1811   APPEARANCEUR CLOUDY (A) 08/06/2021 1811   APPEARANCEUR Clear 01/22/2019 0000   LABSPEC 1.016 08/06/2021 1811   PHURINE 6.0 08/06/2021 1811   GLUCOSEU 50 (A) 08/06/2021 1811   HGBUR LARGE (A) 08/06/2021 1811   BILIRUBINUR NEGATIVE 08/06/2021 1811   BILIRUBINUR Negative 01/22/2019  0000   KETONESUR NEGATIVE 08/06/2021 1811   PROTEINUR 100 (A) 08/06/2021 1811   NITRITE NEGATIVE 08/06/2021 1811   LEUKOCYTESUR NEGATIVE 08/06/2021 1811   Family Communication: None at bedside  Disposition: Status is: Inpatient Remains inpatient appropriate because: Awaiting adequate placement at SNF [facility] denied LTAC). Patient is medically stable for discharge to appropriate venue. Planned Discharge Destination: Skilled nursing facility  Patrecia Pour, MD 09/22/2021 11:02 AM Page by [email or web address]

## 2021-09-22 NOTE — TOC Progression Note (Signed)
Transition of Care [facility]) - Progression Note    Patient Details  Name: Joshua Donovan MRN: [id number] Date of Birth: 30-Apr-1955  Transition of Care [facility]) CM/SW [place], [place] Phone Number: 09/22/2021, 11:13 AM  Clinical Narrative:     Patient has no bed offers at this time.   Adonis Brook - sent message patient may meet LTACH  criteria still with increased secretions.  She will try to get authorization.  TOC wull continue to follow and assist with discharge planning.   Thurmond Butts, MSW, LCSW Clinical Social Worker     Expected Discharge Plan: Skilled Nursing Facility Barriers to Discharge: SNF Pending bed offer  Expected Discharge Plan and Services Expected Discharge Plan: [place]   Discharge Planning Services: CM Consult Post Acute Care Choice: Long Term Acute Care (LTAC) Living arrangements for the past 2 months: Single Family Home                                       Social Determinants of Health (SDOH) Interventions    Readmission Risk Interventions     No data to display

## 2021-09-22 NOTE — TOC Progression Note (Signed)
Transition of Care [facility]) - Progression Note    Patient Details  Name: Makayla Confer MRN: [id number] Date of Birth: [birth year]  Transition of Care [facility]) CM/SW [place], [place] Phone Number: 09/22/2021, 10:57 AM  Clinical Narrative:     Received returned call from patient's daughter,[name]- CSW provided update, no SNF bed offer at this time. Informed, [facility]/SNF does not have availability at this time. CSW inquired if she applied for medicaid. She reports, patient would not eligible for medicaid because " he has too much money in his bank account". She states family will have to use insurance and private pay once SNF placement has been secured. CSW explained, unfortunately limited SNFs that has  staff that can manage patient's with trach. She states understanding.  TOC will continue to follow and assist with discharge planning.  Expected Discharge Plan: Long Term Acute Care (LTAC) Barriers to Discharge: Continued Medical Work up  Expected Discharge Plan and Services Expected Discharge Plan: Long Term Acute Care (LTAC)   Discharge Planning Services: CM Consult Post Acute Care Choice: Long Term Acute Care (LTAC) Living arrangements for the past 2 months: Single Family Home                                       Social Determinants of Health (SDOH) Interventions    Readmission Risk Interventions     No data to display

## 2021-09-23 LAB — GLUCOSE, CAPILLARY
Glucose-Capillary: 131 mg/dL — ABNORMAL HIGH (ref 70–99)
Glucose-Capillary: 168 mg/dL — ABNORMAL HIGH (ref 70–99)
Glucose-Capillary: 180 mg/dL — ABNORMAL HIGH (ref 70–99)
Glucose-Capillary: 137 mg/dL — ABNORMAL HIGH (ref 70–99)
Glucose-Capillary: 115 mg/dL — ABNORMAL HIGH (ref 70–99)
Glucose-Capillary: 166 mg/dL — ABNORMAL HIGH (ref 70–99)

## 2021-09-23 NOTE — Progress Notes (Signed)
Speech Language Pathology Treatment: [name] Speaking valve  Patient Details Name: Joie Reamer MRN: [id number] DOB: 1955-08-10 Today's Date: 09/23/2021 Time: 9518-8416 SLP Time Calculation (min) (ACUTE ONLY): 22 min  Assessment / Plan / Recommendation Clinical Impression   Pt was seen for ongoing PMV trials with now cuffless trach. He achieved immediate, strong phonation as soon as PMV was placed, and was able to phonate again intermittently as valve was left in place for >10 minutes but without the same intensity. He cued be cued at times to increase his volume, primarily when focusing at the phoneme level (especially with /a/). Suspect that secretions may be contributing; however, he also appeared to be having trouble coordinating phonation with speech. SLP played a familiar song for him to try to practice humming/vocalizing too, although he mostly tried to sing along. Phonation was heard minimally during attempts at singing, but again when cued to produce single phonemes, he could do this in between verses. Pt shows improving potential for use of PMV to facilitate speech intelligibility. Will continue to practice with SLP.    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 He  presented 08/02/21 with R foot pain and R-sided weakness. Transferred to [facility] 7/25. 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 S/p stent placement to the R vertebrobasilar junction 7/26, failed extubation. Trach and PEG placed 7/28. PMH: DM, GERD, TBI with prior ICH, HLD, HTN      SLP Plan  Continue with current plan of care      Recommendations  for follow up therapy are one component of a multi-disciplinary discharge planning process, led by the attending physician.  Recommendations may be updated based on patient status, additional functional criteria and insurance authorization.    Recommendations         Patient may use Passy-Muir Speech Valve: with SLP only PMSV Supervision: Full         Oral Care Recommendations: Oral care QID Follow Up Recommendations: Skilled nursing-short term rehab (<3 hours/day) Assistance recommended at discharge: Frequent or constant Supervision/Assistance SLP Visit Diagnosis: Dysarthria and anarthria (R47.1) Plan: Continue with current plan of care           Osie Bond., M.A. [place] Office [phone]  Secure chat preferred   09/23/2021, 3:24 PM

## 2021-09-23 NOTE — Progress Notes (Signed)
Progress Note  Patient: Joshua Donovan MPN:[id number] DOB: 07/12/1955  DOA: 08/03/2021  DOS: 09/23/2021    Brief hospital course: Patient with history of ICH, type II DM, HTN, HLD presented to [facility] on 7/24 with right hand numbness and weakness, initially found to have a small brainstem stroke and ventral medulla.  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  He was intubated and transferred to [facility] for interventional radiology intervention.  Remained in the intensive care unit.  Now with tracheostomy and PEG tube placement.  Pending LTAC transfer.  7/24 presented to [facility], ventral medulla CVA 7/25 tx to [facility], treated with Cleviprex. 7/26 cerebral angiogram with stent placement to right vertebro-basilar junction, failed extubation, MRI brain> mild extension of stroke, now involving bilateral medial medullary, patent basilar artery and R VBJ stent  7/28 bedside tracheostomy by PCCM and PEG tube placement by general surgery 7/30 Hypertension persist despite adding oral agents, glucose remains elevated as well 7/31 Bleeding around trach site, bright red blood.  Underwent bronchoscopy, Trach Removed, patient orally reintubated, stoma packed. 8/2 ENT took patient to OR for trach redo 8/9 transferred to medical floor on tracheostomy now. 8/12 vomiting after bolus feeding with worsening hypoxia. 8/13 copious bleeding from the heparin injection site controlled with pressure dressing. 8/14 bleeding has resolved.  Trach secretions blood-tinged. 8/15 insurance denied LTAC appeal.  CSW working on SNF. 8/16>8/22: 25 beat run of NSVT on 8/21 8/28: peer to peer for LTAC looks like this was denied- Interval x-ray showed basilar opacities--respiratory culture ordered, hypertonic saline started scopolamine discontinued 8/29: V. tach runs-increased metoprolol Resolved 9/2: Slightly increased work of breathing-low-grade temps 99 for chest x-ray showed no  interval changes 9/5:  Ancef started 2/2 secretions --no wbc or fever  Assessment and Plan: Acute stroke of medulla oblongata: Initially loaded with aspirin and Brilinta.  Stent to right vertebrobasilar junction on 7/26.  Still with dense quadriplegia - Continue aspirin, brilinta and lipitor - Neurology signed off on 8/4. Will need outpatient follow up. - Continue PT/OT/SLP > seeking SNF   Acute respiratory failure with hypoxia due to aspiration pneumonia/hospital-acquired MSSA pneumonia.  CXR on 9/3 shows bibasilar atelectasis/infiltrate.  Concern about Enterobacter/MSSA HAP - Tracheostomy since 7/31, last exchanged 9/8. Followed by PCCM. - Completed ancef 9/15 - 9/12. Repeat CXR 9/12 showed only atelectasis. - Continue hypertonic saline nebs and chest physiotherapy per PCCM. - Continue scopolamine patch for secretions which remain significant. - Trach care per respiratory and PCCM.  - Pulmonary toilet   Alcohol abuse:   - Continue thiamine, multivitamin and folic acid   Uncontrolled DM-2 with hyperglycemia, and hyperlipidemia: A1c 8.3% on 7/25.  - Continue levemir 30u BID  - Continue NovoLog 10 units + SSI every 4 hours - Continue statin. - Further adjustment as appropriate. At inpatient goal.   Essential hypertension: Better controlled but may consider additional agent e.g. scheduled hydralazine. - Continue amlodipine '10mg'$  daily, coreg 37.'5mg'$  BID - Increased HCTZ to '25mg'$  daily  - IV labetalol as needed  LFT elevation: Relatively stable AST and ALT with normal Bili/AlkPhos. RUQ U/S unremarkable.  - Monitor intermittently.    NSVT: Stable. - Coreg increased on 9/2. - Optimize Mg and K. - Cardiology saw patient previously on 8/2   Oropharyngeal dysphagia - Continue tube feed 59m/hr and free water 1011mq4h via PEG. - SLP following   Hypokalemia: Resolved.  - Recheck intermittently.   Tracheostomy complication/bleeding: In the setting of DAPT.  Resolved. -Tracheostomy  revised  by ENT   Small sacral decubitus prior to admission - Continue monitoring  Diffuse swelling, suspected to be due to inactivity. Not particularly responsive to lasix. Hypoalbuminemia contributing as well.  - Optimize nutrition. Improved.  Subjective: No overnight issues, no acute complaints per patient. Continues to have significant secretions.  Objective: Vitals:   09/23/21 0400 09/23/21 0740 09/23/21 0804 09/23/21 1127  BP:  (!) 140/86  129/85  Pulse:  90 90 83  Resp:  (!) 29 (!) 34 (!) 25  Temp:  98.4 F (36.9 C)  98.4 F (36.9 C)  TempSrc:  Oral  Oral  SpO2: 96% 95% 95% 94%  Weight:      Height:       Gen: 66 y.o. male in no distress Pulm: Nonlabored breathing thru trach with copious secretions, no lower wheezing/crackles. CV: Regular rate and rhythm. No murmur, rub, or gallop. No JVD, no dependent edema. GI: Abdomen soft, non-tender, non-distended, with normoactive bowel sounds.  Ext: Warm, no deformities Skin: PEG c/d/i without new rashes, lesions or ulcers on visualized skin. Neuro: Alert and oriented. Quadriplegic. Psych: Judgement and insight appear fair. Mood euthymic & affect congruent. Behavior is appropriate.    Data Personally reviewed: Basic Metabolic Panel: Recent Labs  Lab 09/18/21 0620 09/21/21 0938  NA 135 137  K 3.6 4.4  CL 100 103  CO2 26 25  GLUCOSE 148* 191*  BUN 13 14  CREATININE 0.50* 0.58*  CALCIUM 8.8* 8.9   GFR: Estimated Creatinine Clearance: 94.9 mL/min (A) (by C-G formula based on SCr of 0.58 mg/dL (L)). Liver Function Tests: Recent Labs  Lab 09/18/21 0620 09/21/21 0938  AST 49* 65*  ALT 72* 91*  ALKPHOS 41 47  BILITOT 0.5 0.3  PROT 6.2* 6.2*  ALBUMIN 2.6* 2.6*   Urine analysis:    Component Value Date/Time   COLORURINE RED (A) 08/06/2021 1811   APPEARANCEUR CLOUDY (A) 08/06/2021 1811   APPEARANCEUR Clear 01/22/2019 0000   LABSPEC 1.016 08/06/2021 1811   PHURINE 6.0 08/06/2021 1811   GLUCOSEU 50 (A) 08/06/2021  1811   HGBUR LARGE (A) 08/06/2021 1811   BILIRUBINUR NEGATIVE 08/06/2021 1811   BILIRUBINUR Negative 01/22/2019 0000   KETONESUR NEGATIVE 08/06/2021 1811   PROTEINUR 100 (A) 08/06/2021 1811   NITRITE NEGATIVE 08/06/2021 1811   LEUKOCYTESUR NEGATIVE 08/06/2021 1811   Family Communication: None at bedside  Disposition: Status is: Inpatient Remains inpatient appropriate because: Awaiting insurance decision regarding LTACH on repeat request 9/13. Patient is medically stable for discharge to appropriate venue. Planned Discharge Destination: Skilled nursing facility  Patrecia Pour, MD 09/23/2021 3:02 PM Page by [email or web address]

## 2021-09-23 NOTE — TOC Progression Note (Signed)
Transition of Care [facility]) - Progression Note    Patient Details  Name: Joshua Donovan MRN: [id number] Date of Birth: 15-Apr-1955  Transition of Care [facility]) CM/SW [place], [place] Phone Number:[phone]  09/23/2021, 11:29 AM  Clinical Narrative:    Pt does meet LTACH criteria. [employer] has submitted  for auth x2, P2P x 2 and written appeal x 2 all with denials.    Expected Discharge Plan: Skilled Nursing Facility Barriers to Discharge: SNF Pending bed offer  Expected Discharge Plan and Services Expected Discharge Plan: [place]   Discharge Planning Services: CM Consult Post Acute Care Choice: Long Term Acute Care (LTAC) Living arrangements for the past 2 months: Single Family Home                                       Social Determinants of Health (SDOH) Interventions    Readmission Risk Interventions     No data to display

## 2021-09-23 NOTE — TOC Progression Note (Signed)
Transition of Care [facility]) - Progression Note    Patient Details  Name: Joshua Donovan MRN: [id number] Date of Birth: 07-Jun-1955  Transition of Care [facility]) CM/SW [place], [place] Phone Number: 09/23/2021, 10:47 AM  Clinical Narrative:     Patient has no bed offers at this time  Expected Discharge Plan: [place] Barriers to Discharge: SNF Pending bed offer  Expected Discharge Plan and Services Expected Discharge Plan: [place]   Discharge Planning Services: CM Consult Post Acute Care Choice: Long Term Acute Care (LTAC) Living arrangements for the past 2 months: Single Family Home                                       Social Determinants of Health (SDOH) Interventions    Readmission Risk Interventions     No data to display

## 2021-09-24 LAB — GLUCOSE, CAPILLARY
Glucose-Capillary: 147 mg/dL — ABNORMAL HIGH (ref 70–99)
Glucose-Capillary: 152 mg/dL — ABNORMAL HIGH (ref 70–99)
Glucose-Capillary: 152 mg/dL — ABNORMAL HIGH (ref 70–99)
Glucose-Capillary: 142 mg/dL — ABNORMAL HIGH (ref 70–99)
Glucose-Capillary: 129 mg/dL — ABNORMAL HIGH (ref 70–99)
Glucose-Capillary: 117 mg/dL — ABNORMAL HIGH (ref 70–99)

## 2021-09-24 LAB — COMPREHENSIVE METABOLIC PANEL
ALT: 92 U/L — ABNORMAL HIGH (ref 0–44)
AST: 41 U/L (ref 15–41)
Albumin: 2.7 g/dL — ABNORMAL LOW (ref 3.5–5.0)
Alkaline Phosphatase: 43 U/L (ref 38–126)
Anion gap: 10 (ref 5–15)
BUN: 13 mg/dL (ref 8–23)
CO2: 26 mmol/L (ref 22–32)
Calcium: 9 mg/dL (ref 8.9–10.3)
Chloride: 102 mmol/L (ref 98–111)
Creatinine, Ser: 0.53 mg/dL — ABNORMAL LOW (ref 0.61–1.24)
GFR, Estimated: >60 mL/min (ref >60)
Glucose, Bld: 170 mg/dL — ABNORMAL HIGH (ref 70–99)
Potassium: 3.9 mmol/L (ref 3.5–5.1)
Sodium: 138 mmol/L (ref 135–145)
Total Bilirubin: 0.3 mg/dL (ref 0.3–1.2)
Total Protein: 6.2 g/dL — ABNORMAL LOW (ref 6.5–8.1)

## 2021-09-24 NOTE — Progress Notes (Signed)
[facility] PROGRESS NOTE   Joshua Donovan MHD:[id number] DOB: [birth year] DOA: 08/03/2021  PCP: [facility] [place]  Brief History/Interval Summary: Patient with history of ICH, type II DM, HTN, HLD presented to [facility] on 7/24 with right hand numbness and weakness, initially found to have a small brainstem stroke and ventral medulla.  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  He was intubated and transferred to [facility] for interventional radiology intervention.  Remained in the intensive care unit.  Now with tracheostomy and PEG tube placement.    7/24 presented to [facility], ventral medulla CVA 7/25 tx to [facility], treated with Cleviprex. 7/26 cerebral angiogram with stent placement to right vertebro-basilar junction, failed extubation, MRI brain> mild extension of stroke, now involving bilateral medial medullary, patent basilar artery and R VBJ stent  7/28 bedside tracheostomy by PCCM and PEG tube placement by general surgery 7/30 Hypertension persist despite adding oral agents, glucose remains elevated as well 7/31 Bleeding around trach site, bright red blood.  Underwent bronchoscopy, Trach Removed, patient orally reintubated, stoma packed. 8/2 ENT took patient to OR for trach redo 8/9 transferred to medical floor on tracheostomy now. 8/12 vomiting after bolus feeding with worsening hypoxia. 8/13 copious bleeding from the heparin injection site controlled with pressure dressing. 8/14 bleeding has resolved.  Trach secretions blood-tinged. 8/15 insurance denied LTAC appeal.  CSW working on SNF. 8/16>8/22: 25 beat run of NSVT on 8/21 8/28: peer to peer for LTAC looks like this was denied- Interval x-ray showed basilar opacities--respiratory culture ordered, hypertonic saline started scopolamine discontinued 8/29: V. tach runs-increased metoprolol Resolved 9/2: Slightly increased work of breathing-low-grade temps 99 for  chest x-ray showed no interval changes 9/5:  Ancef started 2/2 secretions --no wbc or fever  Consultants: Neurology.  Critical care medicine.     Subjective/Interval History: Patient with tracheostomy.  Awake.  Denies any complaints by shaking his head.  Denies pain specifically.    Assessment/Plan:  Acute stroke of medulla oblongata Initially loaded with aspirin and Brilinta.  Stent to right vertebrobasilar junction on 7/26.  Still with dense quadriplegia - Continue aspirin, brilinta and lipitor - Neurology signed off on 8/4. Will need outpatient follow up. - Continue PT/OT/SLP > seeking SNF   Acute respiratory failure with hypoxia due to aspiration pneumonia/hospital-acquired MSSA pneumonia.   CXR on 9/3 shows bibasilar atelectasis/infiltrate.  Concern about Enterobacter/MSSA HAP - Tracheostomy since 7/31, last exchanged 9/8. Followed by PCCM. - Completed ancef 9/15 - 9/12. Repeat CXR 9/12 showed only atelectasis. - hypertonic saline nebs and chest physiotherapy per PCCM. - scopolamine patch for secretions. Lurline Idol care per respiratory and PCCM.  - Pulmonary toilet   Alcohol abuse:   - Continue thiamine, multivitamin and folic acid   Uncontrolled DM-2 with hyperglycemia, and hyperlipidemia A1c 8.3% on 7/25.  Patient noted to be on Levemir and SSI.  Also on statin.  Monitor CBGs.   Essential hypertension Reasonably well controlled.  Noted to be on amlodipine, carvedilol, HCTZ.    LFT elevation Relatively stable AST and ALT with normal Bili/AlkPhos. RUQ U/S unremarkable.  - Monitor intermittently.  Seems to have improved.   NSVT Stable. - Coreg increased on 9/2. - Optimize Mg and K. - Cardiology saw patient previously on 8/2   Oropharyngeal dysphagia status post PEG tube placement - Continue tube feed 45m/hr and free water [id number] via PEG. - SLP following   Hypokalemia Resolved.  - Recheck intermittently.   Tracheostomy complication/bleeding In  the  setting of DAPT.  Resolved. -Tracheostomy revised by ENT   Small sacral decubitus prior to admission - Continue monitoring    DVT Prophylaxis: SCDs Code Status: Full code Family Communication: No family at bedside Disposition Plan: Waiting on placement  Status is: Inpatient Remains inpatient appropriate because: Acute stroke with respiratory failure status post tracheostomy and PEG tube      Medications: Scheduled:  amLODipine  10 mg Per Tube Daily   aspirin  81 mg Per Tube Daily   atorvastatin  80 mg Per Tube Daily   carvedilol  37.5 mg Per Tube BID WC   diclofenac Sodium  2 g Topical QID   feeding supplement (PROSource TF20)  60 mL Per Tube Daily   fiber supplement (BANATROL TF)  60 mL Per Tube BID   folic acid  1 mg Per Tube Daily   free water  100 mL Per Tube Q4H   guaiFENesin  10 mL Per Tube Q4H   hydrochlorothiazide  25 mg Per Tube Daily   insulin aspart  0-9 Units Subcutaneous Q4H   insulin aspart  10 Units Subcutaneous Q4H   insulin detemir  30 Units Subcutaneous BID   multivitamin with minerals  1 tablet Per Tube Daily   mouth rinse  15 mL Mouth Rinse Q2H   potassium chloride  40 mEq Per Tube BID   thiamine  100 mg Per Tube Daily   ticagrelor  90 mg Per Tube BID   Continuous:  feeding supplement ([name]) 1,000 mL (09/24/21 0625)   GEX:[id number] **OR** acetaminophen (TYLENOL) oral liquid 160 mg/5 mL **OR** acetaminophen, bisacodyl, hydrALAZINE, labetalol, mouth rinse, senna-docusate  Antibiotics: Anti-infectives (From admission, onward)    Start     Dose/Rate Route Frequency Ordered Stop   09/14/21 1445  ceFAZolin (ANCEF) IVPB 2g/100 mL premix        2 g 200 mL/hr over 30 Minutes Intravenous Every 8 hours 09/14/21 1356 09/21/21 0709   08/11/21 1400  ceFEPIme (MAXIPIME) 2 g in sodium chloride 0.9 % 100 mL IVPB        2 g 200 mL/hr over 30 Minutes Intravenous Every 8 hours 08/11/21 0957 08/16/21 0633   08/09/21 1300  cefTRIAXone  (ROCEPHIN) 1 g in sodium chloride 0.9 % 100 mL IVPB  Status:  Discontinued        1 g 200 mL/hr over 30 Minutes Intravenous Every 24 hours 08/09/21 1151 08/11/21 0957   08/08/21 1200  ceFEPIme (MAXIPIME) 2 g in sodium chloride 0.9 % 100 mL IVPB  Status:  Discontinued        2 g 200 mL/hr over 30 Minutes Intravenous Every 8 hours 08/08/21 1104 08/09/21 1151   08/06/21 1500  cefTRIAXone (ROCEPHIN) 2 g in sodium chloride 0.9 % 100 mL IVPB  Status:  Discontinued        2 g 200 mL/hr over 30 Minutes Intravenous Every 24 hours 08/06/21 1421 08/08/21 1104   08/04/21 1006  ceFAZolin (ANCEF) 2-4 GM/100ML-% IVPB       Note to Pharmacy: Margaretmary Dys E: cabinet override      08/04/21 1006 08/04/21 2214       Objective:  Vital Signs  Vitals:   09/24/21 0307 09/24/21 0431 09/24/21 0744 09/24/21 0825  BP: 130/86 136/80 136/88   Pulse: 89 88 86 90  Resp: (!) 28 (!) 37 (!) 32 (!) 32  Temp: 98.6 F (37 C)  98.8 F (37.1 C)   TempSrc: Oral  Oral  SpO2: 91% 94% 95% 96%  Weight:      Height:        Intake/Output Summary (Last 24 hours) at 09/24/2021 1054 Last data filed at 09/24/2021 0310 Gross per 24 hour  Intake --  Output 1850 ml  Net -1850 ml   Filed Weights   09/20/21 0500 09/21/21 0500 09/22/21 0459  Weight: 85.7 kg 85.7 kg 85.7 kg    General appearance: Awake alert.  In no distress Tracheostomy noted. Resp: S.  Diminished air entry at the bases.  No wheezing or rhonchi. Cardio: S1-S2 is normal regular.  No S3-S4.  No rubs murmurs or bruit GI: Abdomen is soft.  Nontender nondistended.  Bowel sounds are present normal.  No masses organomegaly.  PEG tube is noted Neurologic: Quadriplegic   Lab Results:  Data Reviewed: I have personally reviewed following labs and reports of the imaging studies  CBC: No results for input(s): "WBC", "NEUTROABS", "HGB", "HCT", "MCV", "PLT" in the last 168 hours.  Basic Metabolic Panel: Recent Labs  Lab 09/18/21 0620 09/21/21 0938  09/24/21 0633  NA 135 137 138  K 3.6 4.4 3.9  CL 100 103 102  CO2 '26 25 26  '$ GLUCOSE 148* 191* 170*  BUN '13 14 13  '$ CREATININE 0.50* 0.58* 0.53*  CALCIUM 8.8* 8.9 9.0    GFR: Estimated Creatinine Clearance: 94.9 mL/min (A) (by C-G formula based on SCr of 0.53 mg/dL (L)).  Liver Function Tests: Recent Labs  Lab 09/18/21 0620 09/21/21 0938 09/24/21 0633  AST 49* 65* 41  ALT 72* 91* 92*  ALKPHOS 41 47 43  BILITOT 0.5 0.3 0.3  PROT 6.2* 6.2* 6.2*  ALBUMIN 2.6* 2.6* 2.7*     CBG: Recent Labs  Lab 09/23/21 1600 09/23/21 1956 09/23/21 2310 09/24/21 0309 09/24/21 0743  GLUCAP 137* 115* 166* 147* 152*     Radiology Studies: No results found.     LOS: 26 days   Bravery Ketcham [employer] on [email or web address]  09/24/2021, 10:54 AM

## 2021-09-24 NOTE — Progress Notes (Signed)
SLP Cancellation Note  Patient Details Name: Ronny Korff MRN: [id number] DOB: [birth year]   Cancelled treatment:       Reason Eval/Treat Not Completed: Other (comment) Pt working with PT/OT - will f/u as able.     Osie Bond., M.A. [place] Office [phone]  Secure chat preferred  09/24/2021, 12:01 PM

## 2021-09-24 NOTE — Progress Notes (Signed)
Physical Therapy Treatment Patient Details Name: Joshua Donovan MRN: [id number] DOB: December 28, 1955 Today's Date: 09/24/2021   History of Present Illness Pt is a 66 y.o. male who presented 08/02/21 with R foot pain and R-sided weakness. Transferred to [facility] 7/25. 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 S/p stent placement to the R vertebrobasilar junction 7/26, failed extubation. Trach and PEG placed 7/28. S/p bronchoscopic evaluation, oral reintubation and tracheostomy removal with stoma packing 7/31 due to trach site bleeding. S/p trach revision 8/2. PMH: DM, GERD, TBI with prior ICH, HLD, HTN    PT Comments    The pt was agreeable to session, making good progress with seated tolerance at EOB, but continues to need significant assist due to poor core strength. The pt continues to require increased assist to initiate cervical extension against gravity, but is able to maintain for short bouts (up to 10 seconds), and was able to initiate horizontal movements once assisted into neutral position. The pt was then assisted to tilt-in-space WC through dependent transfer, tolerated well with VSS. Continue to recommend acute PT and LTACH when medically stable.     Recommendations for follow up therapy are one component of a multi-disciplinary discharge planning process, led by the attending physician.  Recommendations may be updated based on patient status, additional functional criteria and insurance authorization.  Follow Up Recommendations  PT at Long-term acute care hospital Can patient physically be transported by private vehicle: No    Assistance Recommended at Discharge Frequent or constant Supervision/Assistance  Patient can return home with the following Assistance with cooking/housework;Two people to help with walking and/or transfers;Two people to help with bathing/dressing/bathroom;Assistance with feeding;Direct supervision/assist for medications management;Direct supervision/assist for financial management;Assist for transportation;Help with stairs or ramp for entrance   Equipment Recommendations  Other (comment) (defer to next venue of care)    Recommendations for Other Services       Precautions / Restrictions Precautions Precautions: Fall Precaution Comments: trach collar, peg, SBP < 180, prevalon boots, copious trach secretions, watch HR Restrictions Weight Bearing Restrictions: No     Mobility  Bed Mobility Overal bed mobility: Needs Assistance Bed Mobility: Supine to Sit     Supine to sit: Total assist, +2 for physical assistance, +2 for safety/equipment     General bed mobility comments: totalA of 2 to roll and reposition. assist to all extremities to reposition, trunk, and head. sat EOB for 25 min with max-totalA posterior support and intermittent assist to scoot hips back, reposition legs or arms, and assist with cervical extension    Transfers Overall transfer level: Needs assistance Equipment used: 1 person hand held assist Transfers: Bed to chair/wheelchair/BSC       Squat pivot transfers: Total assist, +2 safety/equipment     General transfer comment: 1 person front-facing transfer with pt dependent on therapist for all aspects of transfer. bilateral knees blocked, trunk supported. pivot from EOB to [facility] with armrest removed    Ambulation/Gait               General Gait Details: unable   Modified Rankin (Stroke Patients Only) Modified Rankin (Stroke Patients Only) Pre-Morbid Rankin Score: No symptoms Modified Rankin: Severe disability  Balance Overall balance  assessment: Needs assistance Sitting-balance support: No upper extremity supported, Feet supported Sitting balance-Leahy Scale: Zero Sitting balance - Comments: dependent on support from 1-2 therapists. maintained for 25 min with cues for position Postural control: Posterior lean                                  Cognition Arousal/Alertness: Awake/alert Behavior During Therapy: WFL for tasks assessed/performed Overall Cognitive Status: Difficult to assess                         Following Commands: Follows one step commands consistently       General Comments: able to follow simple commands, at times limited by strength deficits more than lack of understanding.        Exercises General Exercises - Lower Extremity Ankle Circles/Pumps: PROM, 10 reps, Supine, Both Heel Slides: PROM, Both, 10 reps, Supine, Limitations Heel Slides Limitations: painful for R knee Hip ABduction/ADduction: PROM, Both, 10 reps, Supine Other Exercises Other Exercises: sitting balance/tolerance for 25 min. maxA posterior support and to correct L lean Other Exercises: cervical rotation, flexion, extension, AAROM for cervical extension with up to 10 sec holds x5 Other Exercises: scapular retraction, assisted by therapists with assist to initiate cervical extension, shoulder retraction, and upright posture. x5    General Comments General comments (skin integrity, edema, etc.): VSS with seated position      Pertinent Vitals/Pain Pain Assessment Pain Assessment: Faces Faces Pain Scale: Hurts little more Pain Location: grimacing with flexion of R knee Pain Descriptors / Indicators: Discomfort, Grimacing Pain Intervention(s): Limited activity within patient's tolerance, Monitored during session, Repositioned     PT Goals (current goals can now be found in the care plan section) Acute Rehab PT Goals Patient Stated Goal: agreeable to OOB to chair PT Goal Formulation: Patient unable  to participate in goal setting Time For Goal Achievement: 10/01/21 Potential to Achieve Goals: Fair Progress towards PT goals: Progressing toward goals    Frequency    Min 3X/week      PT Plan Current plan remains appropriate    Co-evaluation PT/OT/SLP Co-Evaluation/Treatment: Yes Reason for Co-Treatment: Complexity of the patient's impairments (multi-system involvement);Necessary to address cognition/behavior during functional activity;For patient/therapist safety;To address functional/ADL transfers          AM-PAC PT "6 Clicks" Mobility   Outcome Measure  Help needed turning from your back to your side while in a flat bed without using bedrails?: Total Help needed moving from lying on your back to sitting on the side of a flat bed without using bedrails?: Total Help needed moving to and from a bed to a chair (including a wheelchair)?: Total Help needed standing up from a chair using your arms (e.g., wheelchair or bedside chair)?: Total Help needed to walk in hospital room?: Total Help needed climbing 3-5 steps with a railing? : Total 6 Click Score: 6    End of Session Equipment Utilized During Treatment: Oxygen Activity Tolerance: Patient tolerated treatment well Patient left: in chair;with call bell/phone within reach;with chair alarm set Nurse Communication: Mobility status;Other (comment) PT Visit Diagnosis: Muscle weakness (generalized) (M62.81);Other symptoms and signs involving the nervous system (R29.898);Other abnormalities of gait and mobility (R26.89);Other (comment) Hemiplegia - Right/Left: Right Hemiplegia - dominant/non-dominant: Dominant Hemiplegia - caused by: Cerebral infarction     Time: 9509-3267 PT Time Calculation (min) (ACUTE ONLY): 43 min  Charges:  $  Therapeutic Exercise: 8-22 mins $Therapeutic Activity: 8-22 mins                     [name], PT, DPT   Acute Rehabilitation Department   [name] 09/24/2021, 11:24 AM

## 2021-09-24 NOTE — Progress Notes (Signed)
Occupational Therapy Treatment Patient Details Name: Joshua Donovan MRN: [id number] DOB: 05/28/1955 Today's Date: 09/24/2021   History of present illness Pt is a 66 y.o. male who presented 08/02/21 with R foot pain and R-sided weakness. Transferred to [facility] 7/25. 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 S/p stent placement to the R vertebrobasilar junction 7/26, failed extubation. Trach and PEG placed 7/28. S/p bronchoscopic evaluation, oral reintubation and tracheostomy removal with stoma packing 7/31 due to trach site bleeding. S/p trach revision 8/2. PMH: DM, GERD, TBI with prior ICH, HLD, HTN   OT comments  Pt making progress with some active cervical extension EOB this session.  Overall total assist +2 for supine to sit with total assist for static sitting balance.  He is able to exhibit some active cervical extension to neutral for intervals of up to 10 seconds with min facilitation for initiation.  Slight small head movements yes/no seen as well.  Total +2 for squat pivot transfer to the tilt in space wheelchair with positioning of UEs on pillows and chair tilted back to help provide for adequate head support.  Recommend continued acute care OT with transition to [facility] for continued rehab.     Recommendations for follow up therapy are one component of a multi-disciplinary discharge planning process, led by the attending physician.  Recommendations may be updated based on patient status, additional functional criteria and insurance authorization.    Follow Up Recommendations  OT at Long-term acute care hospital    Assistance  Recommended at Discharge Frequent or constant Supervision/Assistance  Patient can return home with the following  Two people to help with walking and/or transfers;Two people to help with bathing/dressing/bathroom;Assistance with cooking/housework;Assistance with feeding;Direct supervision/assist for medications management;Direct supervision/assist for financial management;Assist for transportation;Help with stairs or ramp for entrance   Equipment Recommendations  Other (comment) (TBD next venue of care)       Precautions / Restrictions Precautions Precautions: Fall Precaution Comments: trach collar, peg, SBP < 180, prevalon boots, copious trach secretions, watch HR Restrictions Weight Bearing Restrictions: No       Mobility Bed Mobility Overal bed mobility: Needs Assistance Bed Mobility: Supine to Sit Rolling: Total assist, +2 for physical assistance, +2 for safety/equipment              Transfers Overall transfer level: Needs assistance Equipment used: None Transfers: Bed to chair/wheelchair/BSC     Squat pivot transfers: Total assist, +2 safety/equipment       General transfer comment: 1 person front-facing transfer with pt dependent on therapist for all aspects of transfer. bilateral knees blocked, trunk supported. pivot from EOB to [facility] with armrest removed     Balance Overall balance assessment: Needs assistance Sitting-balance support: No upper extremity supported, Feet supported Sitting balance-Leahy Scale: Zero Sitting balance - Comments: Total assist for sitting balance EOB.                                   ADL either performed or assessed with clinical judgement   ADL Overall ADL's : Needs assistance/impaired     Grooming: Wash/dry face;Total assistance Grooming Details (  indicate cue type and reason): hand over hand with the RUE             Lower Body Dressing: Total assistance Lower Body Dressing Details (indicate cue type and  reason): total assist for gripper socks in bed               General ADL Comments: Pt needing total assist +2 for supine to sit and sat EOB for 25 mins while working on trunk and head control.  He needs total assist to maintain sitting balance with some AROM cervical extension.  He is able to lift head up with min facilitation to neutral and maintain for up to 10 seconds before falling into flexion.  Max facilitation to work on anterior pelvic tilt with scapular retraction in sitting with BUEs in weightbearing and being maintained by second therapist.  Completed squat pivot transfer to the tilt in space wheelchair with total +2 assist.  Vitals stable throughout session.    Extremity/Trunk Assessment Upper Extremity Assessment RUE Deficits / Details: unable to obtain full PROM of hand and wirst, pt grimancing with pain. Increased edema. Noted shoulder flexion with cervical extension RUE Sensation: WNL RUE Coordination: decreased fine motor;decreased gross motor LUE Deficits / Details: unable to obtain full PROM of hand and wirst, pt grimancing with pain. Increased edema. Noted shoulder flexion with cervical extension LUE Sensation: WNL LUE Coordination: decreased fine motor;decreased gross motor                      Cognition Arousal/Alertness: Awake/alert Behavior During Therapy: WFL for tasks assessed/performed Overall Cognitive Status: Difficult to assess (pt with limited commnication with eye blinking, mouthing words without vocalization, and inconsistent head movements)                                                General Comments VSS with seated position    Pertinent Vitals/ Pain       Pain Assessment Pain Assessment: Faces Faces Pain Scale: No hurt         Frequency  Min 2X/week        Progress Toward Goals  OT Goals(current goals can now be found in the care plan section)  Progress towards OT goals: Progressing toward goals  Acute  Rehab OT Goals OT Goal Formulation: Patient unable to participate in goal setting Time For Goal Achievement: 09/29/21 Potential to Achieve Goals: [place] Discharge plan remains appropriate    Co-evaluation    PT/OT/SLP Co-Evaluation/Treatment: Yes Reason for Co-Treatment: Complexity of the patient's impairments (multi-system involvement);For patient/therapist safety;To address functional/ADL transfers   OT goals addressed during session: ADL's and self-care;Strengthening/ROM;Other (comment)      AM-PAC OT "6 Clicks" Daily Activity     Outcome Measure   Help from another person eating meals?: Total Help from another person taking care of personal grooming?: Total Help from another person toileting, which includes using toliet, bedpan, or urinal?: Total Help from another person bathing (including washing, rinsing, drying)?: Total Help from another person to put on and taking off regular upper body clothing?: Total Help from another person to put on and taking off regular lower body clothing?: Total 6 Click Score: 6    End of Session Equipment Utilized During Treatment: Oxygen  OT Visit Diagnosis: Other symptoms and signs involving the nervous system (R29.898);Muscle weakness (  generalized) (M62.81)   Activity Tolerance Patient tolerated treatment well   Patient Left in chair;with call bell/phone within reach;with chair alarm set   Nurse Communication Need for lift equipment        Time: [phone] OT Time Calculation (min): 43 min  Charges: OT General Charges $OT Visit: 1 Visit OT Treatments $Therapeutic Activity: 23-37 mins  Ivelisse Culverhouse OTR/L 09/24/2021, 1:31 PM

## 2021-09-25 DIAGNOSIS — R7989 Other specified abnormal findings of blood chemistry: Secondary | ICD-10-CM

## 2021-09-25 LAB — GLUCOSE, CAPILLARY
Glucose-Capillary: 145 mg/dL — ABNORMAL HIGH (ref 70–99)
Glucose-Capillary: 180 mg/dL — ABNORMAL HIGH (ref 70–99)
Glucose-Capillary: 187 mg/dL — ABNORMAL HIGH (ref 70–99)
Glucose-Capillary: 135 mg/dL — ABNORMAL HIGH (ref 70–99)
Glucose-Capillary: 137 mg/dL — ABNORMAL HIGH (ref 70–99)

## 2021-09-25 NOTE — Progress Notes (Signed)
[facility] PROGRESS NOTE   Joshua Donovan IWL:[id number] DOB: 02-05-1955 DOA: 08/03/2021  PCP: [facility] [place]  Brief History/Interval Summary: Patient with history of ICH, type II DM, HTN, HLD presented to [facility] hospital on 7/24 with right hand numbness and weakness, initially found to have a small brainstem stroke and ventral medulla.  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  He was intubated and transferred to [facility] for interventional radiology intervention.  Remained in the intensive care unit.  Now with tracheostomy and PEG tube placement.    7/24 presented to [facility], ventral medulla CVA 7/25 tx to [facility], treated with Cleviprex. 7/26 cerebral angiogram with stent placement to right vertebro-basilar junction, failed extubation, MRI brain> mild extension of stroke, now involving bilateral medial medullary, patent basilar artery and R VBJ stent  7/28 bedside tracheostomy by PCCM and PEG tube placement by general surgery 7/30 Hypertension persist despite adding oral agents, glucose remains elevated as well 7/31 Bleeding around trach site, bright red blood.  Underwent bronchoscopy, Trach Removed, patient orally reintubated, stoma packed. 8/2 ENT took patient to OR for trach redo 8/9 transferred to medical floor on tracheostomy now. 8/12 vomiting after bolus feeding with worsening hypoxia. 8/13 copious bleeding from the heparin injection site controlled with pressure dressing. 8/14 bleeding has resolved.  Trach secretions blood-tinged. 8/15 insurance denied LTAC appeal.  CSW working on SNF. 8/16>8/22: 25 beat run of NSVT on 8/21 8/28: peer to peer for LTAC looks like this was denied- Interval x-ray showed basilar opacities--respiratory culture ordered, hypertonic saline started scopolamine discontinued 8/29: V. tach runs-increased metoprolol Resolved 9/2: Slightly increased work of breathing-low-grade temps 99 for  chest x-ray showed no interval changes 9/5:  Ancef started 2/2 secretions --no wbc or fever   Consultants: Neurology.  Critical care medicine.   Subjective/Interval History: No complaints offered by patient.  He shook his head when I asked if he has any pain.     Assessment/Plan:  Acute stroke of medulla oblongata Initially loaded with aspirin and Brilinta.  Stent to right vertebrobasilar junction on 7/26. Still with dense quadriplegia.  Neurological status is stable. - Continue aspirin, brilinta and lipitor - Neurology signed off on 8/4. Will need outpatient follow up. - Continue PT/OT/SLP > seeking SNF   Acute respiratory failure with hypoxia due to aspiration pneumonia/hospital-acquired MSSA pneumonia.   CXR on 9/3 shows bibasilar atelectasis/infiltrate.  Concern about Enterobacter/MSSA HAP - Tracheostomy since 7/31, last exchanged 9/8. Followed by PCCM. - Completed ancef 9/15 - 9/12. Repeat CXR 9/12 showed only atelectasis. - hypertonic saline nebs and chest physiotherapy per PCCM. - scopolamine patch for secretions. Lurline Idol care per respiratory and PCCM.  - Pulmonary toilet Respiratory status is stable.   Alcohol abuse:   - Continue thiamine, multivitamin and folic acid No evidence for withdrawal symptoms.   Uncontrolled DM-2 with hyperglycemia, and hyperlipidemia A1c 8.3% on 7/25.  Patient noted to be on Levemir and SSI.  Also on statin.  Monitor CBGs.   Essential hypertension Reasonably well controlled.  Noted to be on amlodipine, carvedilol, HCTZ.    LFT elevation Relatively stable AST and ALT with normal Bili/AlkPhos. RUQ U/S unremarkable.  Seems to have improved.   NSVT Stable. - Coreg increased on 9/2. - Cardiology saw patient previously on 8/2   Oropharyngeal dysphagia status post PEG tube placement - Continue tube feed 18m/hr and free water 1037mq4h via PEG. - SLP following   Hypokalemia Resolved.    Tracheostomy  complication/bleeding In the  setting of DAPT.  Resolved. -Tracheostomy revised by ENT   Small sacral decubitus prior to admission - Continue monitoring    DVT Prophylaxis: SCDs Code Status: Full code Family Communication: No family at bedside Disposition Plan: Waiting on placement  Status is: Inpatient Remains inpatient appropriate because: Acute stroke with respiratory failure status post tracheostomy and PEG tube      Medications: Scheduled:  amLODipine  10 mg Per Tube Daily   aspirin  81 mg Per Tube Daily   atorvastatin  80 mg Per Tube Daily   carvedilol  37.5 mg Per Tube BID WC   diclofenac Sodium  2 g Topical QID   feeding supplement (PROSource TF20)  60 mL Per Tube Daily   fiber supplement (BANATROL TF)  60 mL Per Tube BID   folic acid  1 mg Per Tube Daily   free water  100 mL Per Tube Q4H   guaiFENesin  10 mL Per Tube Q4H   hydrochlorothiazide  25 mg Per Tube Daily   insulin aspart  0-9 Units Subcutaneous Q4H   insulin aspart  10 Units Subcutaneous Q4H   insulin detemir  30 Units Subcutaneous BID   multivitamin with minerals  1 tablet Per Tube Daily   mouth rinse  15 mL Mouth Rinse Q2H   potassium chloride  40 mEq Per Tube BID   thiamine  100 mg Per Tube Daily   ticagrelor  90 mg Per Tube BID   Continuous:  feeding supplement ([name]) 1,000 mL (09/24/21 0625)   HMC:[id number] **OR** acetaminophen (TYLENOL) oral liquid 160 mg/5 mL **OR** acetaminophen, bisacodyl, hydrALAZINE, labetalol, mouth rinse, senna-docusate  Antibiotics: Anti-infectives (From admission, onward)    Start     Dose/Rate Route Frequency Ordered Stop   09/14/21 1445  ceFAZolin (ANCEF) IVPB 2g/100 mL premix        2 g 200 mL/hr over 30 Minutes Intravenous Every 8 hours 09/14/21 1356 09/21/21 0709   08/11/21 1400  ceFEPIme (MAXIPIME) 2 g in sodium chloride 0.9 % 100 mL IVPB        2 g 200 mL/hr over 30 Minutes Intravenous Every 8 hours 08/11/21 0957 08/16/21 0633   08/09/21 1300  cefTRIAXone  (ROCEPHIN) 1 g in sodium chloride 0.9 % 100 mL IVPB  Status:  Discontinued        1 g 200 mL/hr over 30 Minutes Intravenous Every 24 hours 08/09/21 1151 08/11/21 0957   08/08/21 1200  ceFEPIme (MAXIPIME) 2 g in sodium chloride 0.9 % 100 mL IVPB  Status:  Discontinued        2 g 200 mL/hr over 30 Minutes Intravenous Every 8 hours 08/08/21 1104 08/09/21 1151   08/06/21 1500  cefTRIAXone (ROCEPHIN) 2 g in sodium chloride 0.9 % 100 mL IVPB  Status:  Discontinued        2 g 200 mL/hr over 30 Minutes Intravenous Every 24 hours 08/06/21 1421 08/08/21 1104   08/04/21 1006  ceFAZolin (ANCEF) 2-4 GM/100ML-% IVPB       Note to Pharmacy: Margaretmary Dys E: cabinet override      08/04/21 1006 08/04/21 2214       Objective:  Vital Signs  Vitals:   09/24/21 2317 09/25/21 0238 09/25/21 0310 09/25/21 0824  BP: (!) 142/86  (!) 142/85   Pulse: 85  84 89  Resp: (!) 30 (!) 29 (!) 29 (!) 27  Temp: 98.3 F (36.8 C)  98.1 F (36.7 C)   TempSrc:  Oral  Oral   SpO2: 91%  95% 96%  Weight:      Height:        Intake/Output Summary (Last 24 hours) at 09/25/2021 1030 Last data filed at 09/25/2021 0630 Gross per 24 hour  Intake 1320 ml  Output 1350 ml  Net -30 ml    Filed Weights   09/20/21 0500 09/21/21 0500 09/22/21 0459  Weight: 85.7 kg 85.7 kg 85.7 kg    General appearance: Awake alert.  In no distress Tracheostomy noted. Resp: Coarse breath bilaterally with few crackles at the bases.  No wheezing or rhonchi. Cardio: S1-S2 is normal regular.  No S3-S4.  No rubs murmurs or bruit GI: Abdomen is soft.  Nontender nondistended.  Bowel sounds are present normal.  No masses organomegaly.  PEG tube noted. Extremities: No edema.      Lab Results:  Data Reviewed: I have personally reviewed following labs and reports of the imaging studies  CBC: No results for input(s): "WBC", "NEUTROABS", "HGB", "HCT", "MCV", "PLT" in the last 168 hours.  Basic Metabolic Panel: Recent Labs  Lab 09/21/21 0938  09/24/21 0633  NA 137 138  K 4.4 3.9  CL 103 102  CO2 25 26  GLUCOSE 191* 170*  BUN 14 13  CREATININE 0.58* 0.53*  CALCIUM 8.9 9.0     GFR: Estimated Creatinine Clearance: 94.9 mL/min (A) (by C-G formula based on SCr of 0.53 mg/dL (L)).  Liver Function Tests: Recent Labs  Lab 09/21/21 0938 09/24/21 0633  AST 65* 41  ALT 91* 92*  ALKPHOS 47 43  BILITOT 0.3 0.3  PROT 6.2* 6.2*  ALBUMIN 2.6* 2.7*      CBG: Recent Labs  Lab 09/24/21 1558 09/24/21 1941 09/24/21 2316 09/25/21 0327 09/25/21 0815  GLUCAP 142* 129* 117* 145* 180*      Radiology Studies: No results found.     LOS: 45 days   Linkyn Gobin [employer] on [email or web address]  09/25/2021, 10:30 AM

## 2021-09-26 LAB — GLUCOSE, CAPILLARY
Glucose-Capillary: 104 mg/dL — ABNORMAL HIGH (ref 70–99)
Glucose-Capillary: 107 mg/dL — ABNORMAL HIGH (ref 70–99)
Glucose-Capillary: 108 mg/dL — ABNORMAL HIGH (ref 70–99)
Glucose-Capillary: 117 mg/dL — ABNORMAL HIGH (ref 70–99)
Glucose-Capillary: 170 mg/dL — ABNORMAL HIGH (ref 70–99)
Glucose-Capillary: 213 mg/dL — ABNORMAL HIGH (ref 70–99)
Glucose-Capillary: 221 mg/dL — ABNORMAL HIGH (ref 70–99)
Glucose-Capillary: 88 mg/dL (ref 70–99)

## 2021-09-26 LAB — CBC
HCT: 38.6 % — ABNORMAL LOW (ref 39.0–52.0)
Hemoglobin: 12.7 g/dL — ABNORMAL LOW (ref 13.0–17.0)
MCH: 27.9 pg (ref 26.0–34.0)
MCHC: 32.9 g/dL (ref 30.0–36.0)
MCV: 84.6 fL (ref 80.0–100.0)
Platelets: 339 10*3/uL (ref 150–400)
RBC: 4.56 MIL/uL (ref 4.22–5.81)
RDW: 13.6 % (ref 11.5–15.5)
WBC: 8.2 10*3/uL (ref 4.0–10.5)
nRBC: 0 % (ref 0.0–0.2)

## 2021-09-26 LAB — COMPREHENSIVE METABOLIC PANEL
ALT: 93 U/L — ABNORMAL HIGH (ref 0–44)
AST: 49 U/L — ABNORMAL HIGH (ref 15–41)
Albumin: 2.7 g/dL — ABNORMAL LOW (ref 3.5–5.0)
Alkaline Phosphatase: 43 U/L (ref 38–126)
Anion gap: 9 (ref 5–15)
BUN: 17 mg/dL (ref 8–23)
CO2: 28 mmol/L (ref 22–32)
Calcium: 9.1 mg/dL (ref 8.9–10.3)
Chloride: 103 mmol/L (ref 98–111)
Creatinine, Ser: 0.5 mg/dL — ABNORMAL LOW (ref 0.61–1.24)
GFR, Estimated: >60 mL/min (ref >60)
Glucose, Bld: 124 mg/dL — ABNORMAL HIGH (ref 70–99)
Potassium: 3.6 mmol/L (ref 3.5–5.1)
Sodium: 140 mmol/L (ref 135–145)
Total Bilirubin: 0.2 mg/dL — ABNORMAL LOW (ref 0.3–1.2)
Total Protein: 6.2 g/dL — ABNORMAL LOW (ref 6.5–8.1)

## 2021-09-26 NOTE — Progress Notes (Addendum)
[facility] PROGRESS NOTE   Joshua Donovan RKY:[id number] DOB: August 01, 1955 DOA: 08/03/2021  PCP: [facility] [place]  Brief History/Interval Summary: Patient with history of ICH, type II DM, HTN, HLD presented to [facility] hospital on 7/24 with right hand numbness and weakness, initially found to have a small brainstem stroke and ventral medulla.  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  He was intubated and transferred to [facility] for interventional radiology intervention.  Remained in the intensive care unit.  Now with tracheostomy and PEG tube placement.    7/24 presented to [facility], ventral medulla CVA 7/25 tx to [facility], treated with Cleviprex. 7/26 cerebral angiogram with stent placement to right vertebro-basilar junction, failed extubation, MRI brain> mild extension of stroke, now involving bilateral medial medullary, patent basilar artery and R VBJ stent  7/28 bedside tracheostomy by PCCM and PEG tube placement by general surgery 7/30 Hypertension persist despite adding oral agents, glucose remains elevated as well 7/31 Bleeding around trach site, bright red blood.  Underwent bronchoscopy, Trach Removed, patient orally reintubated, stoma packed. 8/2 ENT took patient to OR for trach redo 8/9 transferred to medical floor on tracheostomy now. 8/12 vomiting after bolus feeding with worsening hypoxia. 8/13 copious bleeding from the heparin injection site controlled with pressure dressing. 8/14 bleeding has resolved.  Trach secretions blood-tinged. 8/15 insurance denied LTAC appeal.  CSW working on SNF. 8/16>8/22: 25 beat run of NSVT on 8/21 8/28: peer to peer for LTAC looks like this was denied- Interval x-ray showed basilar opacities--respiratory culture ordered, hypertonic saline started scopolamine discontinued 8/29: V. tach runs-increased metoprolol Resolved 9/2: Slightly increased work of breathing-low-grade temps 99 for  chest x-ray showed no interval changes 9/5:  Ancef started 2/2 secretions --no wbc or fever   Consultants: Neurology.  Critical care medicine.   Subjective/Interval History: Denies any pain.  Unable to verbalize due to tracheostomy but was able to shake his head.   Assessment/Plan:  Acute stroke of medulla oblongata Stent to right vertebrobasilar junction on 7/26.  Denies to have quadriplegia.  Neurological status has been stable for the last several days.   Continue aspirin, brilinta and lipitor Neurology signed off on 8/4. Will need outpatient follow up. Continue PT/OT/SLP > seeking SNF   Acute respiratory failure with hypoxia due to aspiration pneumonia/hospital-acquired MSSA pneumonia.   CXR on 9/3 shows bibasilar atelectasis/infiltrate.  Concern about Enterobacter/MSSA HAP Tracheostomy since 7/31, last exchanged 9/8. Followed by PCCM. Completed ancef 9/15 - 9/12. Repeat CXR 9/12 showed only atelectasis. Hypertonic saline nebs and chest physiotherapy per PCCM. Scopolamine patch for secretions. Trach care per respiratory and PCCM.  Pulmonary toilet Respiratory status remains stable.   Alcohol abuse:   Continue thiamine, multivitamin and folic acid No evidence for withdrawal symptoms.   Uncontrolled DM-2 with hyperglycemia, and hyperlipidemia A1c 8.3% on 7/25.  Patient noted to be on Levemir and SSI.  CBGs are reasonably well controlled.   Essential hypertension Reasonably well controlled.  Noted to be on amlodipine, carvedilol, HCTZ.    Mildly abnormal LFTs Relatively stable AST and ALT with normal Bili/AlkPhos. RUQ U/S unremarkable.  Check periodically.  No hepatitis panel results noted.  Will order for tomorrow.   NSVT Stable. Coreg increased on 9/2. Cardiology saw patient previously on 8/2   Oropharyngeal dysphagia status post PEG tube placement Continue tube feed 29m/hr and free water 1053mq4h via PEG. SLP following   Hypokalemia  Remains on scheduled  potassium twice a day.  Tracheostomy complication/bleeding In the setting of DAPT.  Resolved. Tracheostomy revised by ENT   Small sacral decubitus prior to admission Continue monitoring    DVT Prophylaxis: SCDs Code Status: Full code Family Communication: No family at bedside Disposition Plan: Waiting on placement  Status is: Inpatient Remains inpatient appropriate because: Acute stroke with respiratory failure status post tracheostomy and PEG tube      Medications: Scheduled:  amLODipine  10 mg Per Tube Daily   aspirin  81 mg Per Tube Daily   atorvastatin  80 mg Per Tube Daily   carvedilol  37.5 mg Per Tube BID WC   diclofenac Sodium  2 g Topical QID   feeding supplement (PROSource TF20)  60 mL Per Tube Daily   fiber supplement (BANATROL TF)  60 mL Per Tube BID   folic acid  1 mg Per Tube Daily   free water  100 mL Per Tube Q4H   guaiFENesin  10 mL Per Tube Q4H   hydrochlorothiazide  25 mg Per Tube Daily   insulin aspart  0-9 Units Subcutaneous Q4H   insulin aspart  10 Units Subcutaneous Q4H   insulin detemir  30 Units Subcutaneous BID   multivitamin with minerals  1 tablet Per Tube Daily   mouth rinse  15 mL Mouth Rinse Q2H   potassium chloride  40 mEq Per Tube BID   thiamine  100 mg Per Tube Daily   ticagrelor  90 mg Per Tube BID   Continuous:  feeding supplement ([name]) 1,000 mL (09/25/21 2327)   OEV:[id number] **OR** acetaminophen (TYLENOL) oral liquid 160 mg/5 mL **OR** acetaminophen, bisacodyl, hydrALAZINE, labetalol, mouth rinse, senna-docusate  Antibiotics: Anti-infectives (From admission, onward)    Start     Dose/Rate Route Frequency Ordered Stop   09/14/21 1445  ceFAZolin (ANCEF) IVPB 2g/100 mL premix        2 g 200 mL/hr over 30 Minutes Intravenous Every 8 hours 09/14/21 1356 09/21/21 0709   08/11/21 1400  ceFEPIme (MAXIPIME) 2 g in sodium chloride 0.9 % 100 mL IVPB        2 g 200 mL/hr over 30 Minutes Intravenous Every 8 hours  08/11/21 0957 08/16/21 0633   08/09/21 1300  cefTRIAXone (ROCEPHIN) 1 g in sodium chloride 0.9 % 100 mL IVPB  Status:  Discontinued        1 g 200 mL/hr over 30 Minutes Intravenous Every 24 hours 08/09/21 1151 08/11/21 0957   08/08/21 1200  ceFEPIme (MAXIPIME) 2 g in sodium chloride 0.9 % 100 mL IVPB  Status:  Discontinued        2 g 200 mL/hr over 30 Minutes Intravenous Every 8 hours 08/08/21 1104 08/09/21 1151   08/06/21 1500  cefTRIAXone (ROCEPHIN) 2 g in sodium chloride 0.9 % 100 mL IVPB  Status:  Discontinued        2 g 200 mL/hr over 30 Minutes Intravenous Every 24 hours 08/06/21 1421 08/08/21 1104   08/04/21 1006  ceFAZolin (ANCEF) 2-4 GM/100ML-% IVPB       Note to Pharmacy: Margaretmary Dys E: cabinet override      08/04/21 1006 08/04/21 2214       Objective:  Vital Signs  Vitals:   09/26/21 0000 09/26/21 0355 09/26/21 0708 09/26/21 0853  BP: 125/80 125/80 138/89   Pulse: 81 86 84 96  Resp: (!) 25 (!) 30 (!) 32 (!) 24  Temp: 98.5 F (36.9 C) 98 F (36.7 C) 98.2 F (36.8 C)   TempSrc:  Oral Oral Oral   SpO2: 96% 98% 96% 94%  Weight:      Height:        Intake/Output Summary (Last 24 hours) at 09/26/2021 0932 Last data filed at 09/26/2021 0000 Gross per 24 hour  Intake --  Output 600 ml  Net -600 ml    Filed Weights   09/20/21 0500 09/21/21 0500 09/22/21 0459  Weight: 85.7 kg 85.7 kg 85.7 kg    General appearance: Awake alert.  In no distress Tracheostomy noted Resp: Coarse breath sounds bilaterally with few crackles at the bases.  No wheezing or rhonchi. Cardio: S1-S2 is normal regular.  No S3-S4.  No rubs murmurs or bruit GI: Abdomen is soft.  Nontender nondistended.  Bowel sounds are present normal.  No masses organomegaly    Lab Results:  Data Reviewed: I have personally reviewed following labs and reports of the imaging studies  CBC: Recent Labs  Lab 09/26/21 0048  WBC 8.2  HGB 12.7*  HCT 38.6*  MCV 84.6  PLT 827    Basic Metabolic  Panel: Recent Labs  Lab 09/21/21 0938 09/24/21 0633 09/26/21 0048  NA 137 138 140  K 4.4 3.9 3.6  CL 103 102 103  CO2 '25 26 28  '$ GLUCOSE 191* 170* 124*  BUN '14 13 17  '$ CREATININE 0.58* 0.53* 0.50*  CALCIUM 8.9 9.0 9.1     GFR: Estimated Creatinine Clearance: 94.9 mL/min (A) (by C-G formula based on SCr of 0.5 mg/dL (L)).  Liver Function Tests: Recent Labs  Lab 09/21/21 0938 09/24/21 0633 09/26/21 0048  AST 65* 41 49*  ALT 91* 92* 93*  ALKPHOS 47 43 43  BILITOT 0.3 0.3 0.2*  PROT 6.2* 6.2* 6.2*  ALBUMIN 2.6* 2.7* 2.7*      CBG: Recent Labs  Lab 09/25/21 1521 09/25/21 2024 09/26/21 0022 09/26/21 0405 09/26/21 0736  GLUCAP 135* 137* 107* 108* 117*      Radiology Studies: No results found.     LOS: 87 days   Asanti Craigo [employer] on [email or web address]  09/26/2021, 9:32 AM

## 2021-09-27 ENCOUNTER — Inpatient Hospital Stay (HOSPITAL_COMMUNITY): Payer: Medicare PPO

## 2021-09-27 LAB — GLUCOSE, CAPILLARY
Glucose-Capillary: 145 mg/dL — ABNORMAL HIGH (ref 70–99)
Glucose-Capillary: 147 mg/dL — ABNORMAL HIGH (ref 70–99)
Glucose-Capillary: 169 mg/dL — ABNORMAL HIGH (ref 70–99)
Glucose-Capillary: 102 mg/dL — ABNORMAL HIGH (ref 70–99)
Glucose-Capillary: 112 mg/dL — ABNORMAL HIGH (ref 70–99)
Glucose-Capillary: 124 mg/dL — ABNORMAL HIGH (ref 70–99)

## 2021-09-27 LAB — HEPATITIS PANEL, ACUTE
HCV Ab: NONREACTIVE
Hep A IgM: NONREACTIVE
Hep B C IgM: NONREACTIVE
Hepatitis B Surface Ag: NONREACTIVE

## 2021-09-27 MED ORDER — POLYETHYLENE GLYCOL 3350 17 G PO PACK
17.0000 g | PACK | Freq: Every day | ORAL | Status: DC
  Administered 2021-09-28: 17 g
  Filled 2021-09-27: qty 1

## 2021-09-27 MED ORDER — SODIUM CHLORIDE 3 % IN NEBU
4.0000 mL | INHALATION_SOLUTION | Freq: Two times a day (BID) | RESPIRATORY_TRACT | Status: DC
  Administered 2021-09-28 – 2021-10-02 (×9): 4 mL via RESPIRATORY_TRACT
  Filled 2021-09-27 (×13): qty 4

## 2021-09-27 NOTE — TOC Progression Note (Addendum)
Transition of Care [facility]) - Progression Note    Patient Details  Name: Joshua Donovan MRN: [id number] Date of Birth: 1955/10/10  Transition of Care [facility]) CM/SW [place], [place] Phone Number: 09/27/2021, 10:16 AM  Clinical Narrative:     Patient has no bed offers. Message sent to local SNFs to assist in locating SNFs that can manage patient w/trach. Milus Glazier, will send referral to [place].   TOC continue to follow   Thurmond Butts, MSW, LCSW Clinical Social Worker    Expected Discharge Plan: Skilled Nursing Facility Barriers to Discharge: SNF Pending bed offer  Expected Discharge Plan and Services Expected Discharge Plan: [place]   Discharge Planning Services: CM Consult Post Acute Care Choice: Long Term Acute Care (LTAC) Living arrangements for the past 2 months: Single Family Home                                       Social Determinants of Health (SDOH) Interventions    Readmission Risk Interventions     No data to display

## 2021-09-27 NOTE — Progress Notes (Signed)
Occupational Therapy Treatment Patient Details Name: Joshua Donovan MRN: [id number] DOB: 29-Sep-1955 Today's Date: 09/27/2021   History of present illness Pt is a 66 y.o. male who presented 08/02/21 with R foot pain and R-sided weakness. Transferred to [facility] 7/25. 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 S/p stent placement to the R vertebrobasilar junction 7/26, failed extubation. Trach and PEG placed 7/28. S/p bronchoscopic evaluation, oral reintubation and tracheostomy removal with stoma packing 7/31 due to trach site bleeding. S/p trach revision 8/2. PMH: DM, GERD, TBI with prior ICH, HLD, HTN   OT comments  Pt worked on cervical AAROM and BUE PROM exercises as well as positioning during session.  Pt overall max assist for cervical rotation to both sides and then back to midline.  He needs total assist for all UE ROM as well as for rolling side to side in the bed for toileting tasks and positioning.  Recommend continued acute care OT to address these deficits.     Recommendations for follow up therapy are one component of a multi-disciplinary discharge planning process, led by the attending physician.  Recommendations may be updated based on patient status, additional functional criteria and insurance authorization.    Follow Up Recommendations  OT at Long-term acute care hospital    Assistance Recommended at Discharge Frequent or constant Supervision/Assistance  Patient can return home with the following  Two people to help with walking and/or transfers;Two people to help with bathing/dressing/bathroom;Assistance with  cooking/housework;Assistance with feeding;Direct supervision/assist for medications management;Direct supervision/assist for financial management;Assist for transportation;Help with stairs or ramp for entrance   Equipment Recommendations  Other (comment) (TBD next venue of care)       Precautions / Restrictions Precautions Precautions: Fall Precaution Comments: trach collar, peg, SBP < 180, prevalon boots, copious trach secretions, watch HR Restrictions Weight Bearing Restrictions: No       Mobility Bed Mobility Overal bed mobility: Needs Assistance Bed Mobility: Rolling Rolling: Total assist              Transfers                             ADL either performed or assessed with clinical judgement   ADL Overall ADL's : Needs assistance/impaired Eating/Feeding: NPO Eating/Feeding Details (indicate cue type and reason): PEG tube Grooming: Wash/dry face;Total assistance Grooming Details (indicate cue type and reason): hand over hand with the RUE                     Toileting- Clothing Manipulation and Hygiene: Total assistance;+2 for physical assistance;Bed level Toileting - Clothing Manipulation Details (indicate cue type and reason): cleaning up bowel incontinence rolling in the bed     Functional mobility during ADLs: Total assistance;+2 for physical assistance General ADL Comments: Worked on cervical rotation supine with HOB up 35 degrees.  Pt currently max assist for turning head to the right and left as well as back to center.  Completed 4 sets of 5 reps for both the right and left.  Also completed 1 set of 10 reps for PROM shoulder flexion bilaterally, elbow flexion/extension and digit flexion/extension.  Completed rolling with total assist to both sides for clean up of bowel incontinence and changing out bed pad.  He was positioned more onto his left side once session was completed.               Cognition Arousal/Alertness:  Awake/alert Behavior During Therapy: WFL for tasks assessed/performed Overall Cognitive Status: Difficult to assess (pt communicates mostly through blinks, difficult to determine cognition)                                                     Pertinent Vitals/ Pain       Pain Assessment Pain Assessment: Faces Pain Score: 0-No pain         Frequency  Min 2X/week        Progress Toward Goals  OT Goals(current goals can now be found in the care plan section)  Progress towards OT goals: Not progressing toward goals - comment (Pt still with severe limitations in active movement and no family present for education during session.)  Acute Rehab OT Goals OT Goal Formulation: Patient unable to participate in goal setting Time For Goal Achievement: 10/11/21 Potential to Achieve Goals: Good  Plan Discharge plan remains appropriate       AM-PAC OT "6 Clicks" Daily Activity     Outcome Measure   Help from another person eating meals?: Total Help from another person taking care of personal grooming?: Total Help from another person toileting, which includes using toliet, bedpan, or urinal?: Total Help from another person bathing (including washing, rinsing, drying)?: Total Help from another person to put on and taking off regular upper body clothing?: Total Help from another person to put on and taking off regular lower body clothing?: Total 6 Click Score: 6    End of Session Equipment Utilized During Treatment: Oxygen  OT Visit Diagnosis: Other symptoms and signs involving the nervous system (R29.898);Muscle weakness (generalized) (M62.81);Feeding difficulties (R63.3)   Activity Tolerance Patient tolerated treatment well   Patient Left in bed;with call bell/phone within reach   Nurse Communication Mobility status        Time: 5732-2025 OT Time Calculation (min): 58 min  Charges: OT General Charges $OT Visit: 1 Visit OT Treatments $Therapeutic  Activity: 23-37 mins $Therapeutic Exercise: 23-37 mins  Akina Maish OTR/L 09/27/2021, 4:01 PM

## 2021-09-27 NOTE — Progress Notes (Addendum)
NAME:  Joshua Donovan, MRN:  [id number], DOB:  1955/08/04, LOS: 76 ADMISSION DATE:  08/03/2021, CONSULTATION DATE:  7/26 REFERRING MD:  Colvin Caroli FOR CONSULT:  CVA   History of Present Illness:  66 y/o male presented to [facility] on 7/24 with R hand numbness and weakness, found to have small brainstem stroke in ventral medulla.  Symptoms worsened and required transfer to [facility] for neuro IR intervention where he had a stent placed in R vertebrovasilar junction.  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  He had a tracheostomy performed on 7/26 c/b dislodgement and ENT revision on 8/2.  Pertinent  Medical History  TBI with ICH, DM, HTN, HLD  Significant Hospital Events: Including procedures, antibiotic start and stop dates in addition to other pertinent events    7/24 presented to [facility], ventral medulla CVA 7/25 tx to [facility] 7/26 cerebral angiogram with stent placement to right vertebro-basilar junction, failed extubation, left radial aline MRI brain> mild extension of stroke, now involving bilateral medial medullary, patent basilar artery and R VBJ stent  7/28 underwent bedside percutaneous tracheostomy and PEG tube placement, tolerated well 7/29 no acute issues overnight currently on SBT trial this a.m. and tolerating well, Cleviprex resumed earlier this a.m. due to severe hypertension 7/30 Hypertension persist despite adding oral agents, glucose remains elevated as well 7/31 Bleeding around trach site, bright red blood. Copious bloody secretions. Cleviprex off, SBPs 150s-160s. Basal insulin/TF coverage increased. Cefepime deescalated to ceftriaxone. CXR stable. Later in afternoon, continued peritrach bleeding. Bronchoscopic eval completed with intratracheal bleeding associated with trach. Removed, patient orally reintubated, stoma packed. 8/1 ENT consulted for trach revision. Lightly sedated. Vent full support/PRVC, given fatigue following events of yesterday. ENT attempted to  evaluate trach at bedside, could not pass scope. Plan for OR 8/2. ASA/Brilinta held. 8/2 to OR for trach redo 8/6 trach collar during day shift, back on vent overnight 8/10 Hemoptysis, blood with suctioning > improved 8/11 8/14 on 8L / 35% ATC  8/28: Stable vitals, increased tenacious secretions from trach. CXR suggesting new LLL basilar opacities atelectasis vs pna 09/07/2021 Decreased volume and thickness of secretions. No fevers. Tracheal aspirate from 8/28 with mod gram positive cocci and few gram negative rods today, culture pending. 9/5 start cefazolin x 7 days for MSSA pneumonia 9/8 Trach exchange   Interim History / Subjective:  Denies complaints, no SOB, continued copious thick secretions. Remains on 28% Trach collar with good oxygen saturations  Afebrile, WBC 8.2,   Objective   Blood pressure (!) 135/97, pulse 98, temperature 98.7 F (37.1 C), temperature source Oral, resp. rate (!) 24, height '5\' 7"'$  (1.702 m), weight 85.7 kg, SpO2 97 %.    FiO2 (%):  [28 %] 28 %   Intake/Output Summary (Last 24 hours) at 09/27/2021 1243 Last data filed at 09/27/2021 0619 Gross per 24 hour  Intake --  Output 950 ml  Net -950 ml   Filed Weights   09/20/21 0500 09/21/21 0500 09/22/21 0459  Weight: 85.7 kg 85.7 kg 85.7 kg    Examination: General: chronically ill appearing man lying in bed in NAD watching TV, on ATC HEENT: [place]/AT, eyes anicteric, No LAD, No JVD Neuro: awake, alert, moving head side to side. Lip speaks to communicate, tracks and follows commands as able CV: S1S2, RRR PULM: Bilateral chest excursion, Coarse throughout, Thick copious secretions  GI: soft, NT. No erythema around PEG , BS +, Body mass index is 29.59 kg/m.  Extremities: no cyanosis, no  obvious deformities Skin: warm, dry, no diffuse rashes  9/17 labs reviewed.  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 MSSA pneumonia   Plan: Con't routine trach care CXR today ( last was 9/11) Cefazolin coarse completed Con't mucolytics Resp culture if febrile or infiltrate on CXR Con't supporitve care Will add 3% saline nebs and Chest PT for mucus thinning and mobilization   Rest of care per primary. PCCM will con't to follow weekly. Please call in the interim with questions.  Magdalen Spatz, MSN, AGACNP-BC [place] for personal pager PCCM on call pager [phone]   09/27/21 12:43 PM

## 2021-09-27 NOTE — TOC Progression Note (Addendum)
Transition of Care [facility]) - Progression Note    Patient Details  Name: Joshua Donovan MRN: [id number] Date of Birth: December 08, 1955  Transition of Care [facility]) CM/SW [place], [place] Phone Number: 09/27/2021, 1:03 PM  Clinical Narrative:     Sent referral to [facility]- they are unable to offer  left voice message w/[facility] [place] - awaiting return call  [facility] will continue to follow and assist with discharge planning.  Thurmond Butts, MSW, LCSW Clinical Social Worker    Expected Discharge Plan: Skilled Nursing Facility Barriers to Discharge: SNF Pending bed offer  Expected Discharge Plan and Services Expected Discharge Plan: [place]   Discharge Planning Services: CM Consult Post Acute Care Choice: Long Term Acute Care (LTAC) Living arrangements for the past 2 months: Single Family Home                                       Social Determinants of Health (SDOH) Interventions    Readmission Risk Interventions     No data to display

## 2021-09-27 NOTE — Progress Notes (Signed)
[facility] PROGRESS NOTE   Joshua Donovan IRW:[id number] DOB: 1955/07/30 DOA: 08/03/2021  PCP: [facility] [place]  Brief History/Interval Summary: Patient with history of ICH, type II DM, HTN, HLD presented to [facility] on 7/24 with right hand numbness and weakness, initially found to have a small brainstem stroke and ventral medulla.  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  He was intubated and transferred to [facility] for interventional radiology intervention.  Remained in the intensive care unit.  Now with tracheostomy and PEG tube placement.    7/24 presented to [facility], ventral medulla CVA 7/25 tx to [facility], treated with Cleviprex. 7/26 cerebral angiogram with stent placement to right vertebro-basilar junction, failed extubation, MRI brain> mild extension of stroke, now involving bilateral medial medullary, patent basilar artery and R VBJ stent  7/28 bedside tracheostomy by PCCM and PEG tube placement by general surgery 7/30 Hypertension persist despite adding oral agents, glucose remains elevated as well 7/31 Bleeding around trach site, bright red blood.  Underwent bronchoscopy, Trach Removed, patient orally reintubated, stoma packed. 8/2 ENT took patient to OR for trach redo 8/9 transferred to medical floor on tracheostomy now. 8/12 vomiting after bolus feeding with worsening hypoxia. 8/13 copious bleeding from the heparin injection site controlled with pressure dressing. 8/14 bleeding has resolved.  Trach secretions blood-tinged. 8/15 insurance denied LTAC appeal.  CSW working on SNF. 8/16>8/22: 25 beat run of NSVT on 8/21 8/28: peer to peer for LTAC looks like this was denied- Interval x-ray showed basilar opacities--respiratory culture ordered, hypertonic saline started scopolamine discontinued 8/29: V. tach runs-increased metoprolol Resolved 9/2: Slightly increased work of breathing-low-grade temps 99 for  chest x-ray showed no interval changes 9/5:  Ancef started 2/2 secretions --no wbc or fever   Consultants: Neurology.  Critical care medicine.   Subjective/Interval History: Denies any complaints.  Unable to verbalize due to tracheostomy but was able to move his head.     Assessment/Plan:  Acute stroke of medulla oblongata Stent to right vertebrobasilar junction on 7/26.  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  Neurological status has been stable for the last several days.   Continue aspirin, brilinta and lipitor Neurology signed off on 8/4. Will need outpatient follow up. Continue PT/OT/SLP > seeking SNF   Acute respiratory failure with hypoxia due to aspiration pneumonia/hospital-acquired MSSA pneumonia.   CXR on 9/3 shows bibasilar atelectasis/infiltrate.  Concern about Enterobacter/MSSA HAP Tracheostomy since 7/31, last exchanged 9/8. Followed by PCCM. Completed ancef 9/15 - 9/12. Repeat CXR 9/12 showed only atelectasis. Hypertonic saline nebs and chest physiotherapy per PCCM. Scopolamine patch for secretions. Trach care per respiratory and PCCM.  Pulmonary toilet Respiratory status remains stable.   Alcohol abuse:   Continue thiamine, multivitamin and folic acid No evidence for withdrawal symptoms.   Uncontrolled DM-2 with hyperglycemia, and hyperlipidemia A1c 8.3% on 7/25.  Patient noted to be on Levemir and SSI.  CBGs are reasonably well controlled.   Essential hypertension Reasonably well controlled.  Noted to be on amlodipine, carvedilol, HCTZ.    Mildly abnormal LFTs Relatively stable AST and ALT with normal Bili/AlkPhos. RUQ U/S unremarkable.  Check periodically.   Hepatitis panel is unremarkable.   NSVT Stable. Coreg increased on 9/2. Cardiology saw patient previously on 8/2   Oropharyngeal dysphagia status post PEG tube placement Continue tube feed 41m/hr and free water [id number] via PEG. SLP following   Hypokalemia  Remains on scheduled potassium twice a  day.   Tracheostomy complication/bleeding In  the setting of DAPT.  Resolved. Tracheostomy revised by ENT   Small sacral decubitus prior to admission Continue monitoring    DVT Prophylaxis: SCDs Code Status: Full code Family Communication: No family at bedside Disposition Plan: Waiting on placement  Status is: Inpatient Remains inpatient appropriate because: Acute stroke with respiratory failure status post tracheostomy and PEG tube      Medications: Scheduled:  amLODipine  10 mg Per Tube Daily   aspirin  81 mg Per Tube Daily   atorvastatin  80 mg Per Tube Daily   carvedilol  37.5 mg Per Tube BID WC   diclofenac Sodium  2 g Topical QID   feeding supplement (PROSource TF20)  60 mL Per Tube Daily   fiber supplement (BANATROL TF)  60 mL Per Tube BID   folic acid  1 mg Per Tube Daily   free water  100 mL Per Tube Q4H   guaiFENesin  10 mL Per Tube Q4H   hydrochlorothiazide  25 mg Per Tube Daily   insulin aspart  0-9 Units Subcutaneous Q4H   insulin aspart  10 Units Subcutaneous Q4H   insulin detemir  30 Units Subcutaneous BID   multivitamin with minerals  1 tablet Per Tube Daily   mouth rinse  15 mL Mouth Rinse Q2H   polyethylene glycol  17 g Per Tube Daily   potassium chloride  40 mEq Per Tube BID   thiamine  100 mg Per Tube Daily   ticagrelor  90 mg Per Tube BID   Continuous:  feeding supplement ([name]) 1,000 mL (09/25/21 2327)   PXT:GGYIRSWNIOEVO **OR** acetaminophen (TYLENOL) oral liquid 160 mg/5 mL **OR** acetaminophen, bisacodyl, hydrALAZINE, labetalol, mouth rinse, senna-docusate  Antibiotics: Anti-infectives (From admission, onward)    Start     Dose/Rate Route Frequency Ordered Stop   09/14/21 1445  ceFAZolin (ANCEF) IVPB 2g/100 mL premix        2 g 200 mL/hr over 30 Minutes Intravenous Every 8 hours 09/14/21 1356 09/21/21 0709   08/11/21 1400  ceFEPIme (MAXIPIME) 2 g in sodium chloride 0.9 % 100 mL IVPB        2 g 200 mL/hr over 30 Minutes  Intravenous Every 8 hours 08/11/21 0957 08/16/21 0633   08/09/21 1300  cefTRIAXone (ROCEPHIN) 1 g in sodium chloride 0.9 % 100 mL IVPB  Status:  Discontinued        1 g 200 mL/hr over 30 Minutes Intravenous Every 24 hours 08/09/21 1151 08/11/21 0957   08/08/21 1200  ceFEPIme (MAXIPIME) 2 g in sodium chloride 0.9 % 100 mL IVPB  Status:  Discontinued        2 g 200 mL/hr over 30 Minutes Intravenous Every 8 hours 08/08/21 1104 08/09/21 1151   08/06/21 1500  cefTRIAXone (ROCEPHIN) 2 g in sodium chloride 0.9 % 100 mL IVPB  Status:  Discontinued        2 g 200 mL/hr over 30 Minutes Intravenous Every 24 hours 08/06/21 1421 08/08/21 1104   08/04/21 1006  ceFAZolin (ANCEF) 2-4 GM/100ML-% IVPB       Note to Pharmacy: Margaretmary Dys E: cabinet override      08/04/21 1006 08/04/21 2214       Objective:  Vital Signs  Vitals:   09/27/21 0315 09/27/21 0353 09/27/21 0748 09/27/21 0819  BP:  135/79 (!) 153/80   Pulse:  83 83 85  Resp:  (!) 28 20 (!) 24  Temp:  98.2 F (36.8 C) 98.7 F (37.1 C)  TempSrc:  Oral Axillary   SpO2: 94% 95% 92% 93%  Weight:      Height:        Intake/Output Summary (Last 24 hours) at 09/27/2021 1101 Last data filed at 09/27/2021 0619 Gross per 24 hour  Intake --  Output 950 ml  Net -950 ml    Filed Weights   09/20/21 0500 09/21/21 0500 09/22/21 0459  Weight: 85.7 kg 85.7 kg 85.7 kg    General appearance: Awake alert.  In no distress Tracheostomy noted Resp: coarse breath sounds bilaterally.  Few crackles at the bases. Cardio: S1-S2 is normal regular.  No S3-S4.  No rubs murmurs or bruit GI: Abdomen is soft.  Nontender nondistended.  Bowel sounds are present normal.  No masses organomegaly    Lab Results:  Data Reviewed: I have personally reviewed following labs and reports of the imaging studies  CBC: Recent Labs  Lab 09/26/21 0048  WBC 8.2  HGB 12.7*  HCT 38.6*  MCV 84.6  PLT 339     Basic Metabolic Panel: Recent Labs  Lab  09/21/21 0938 09/24/21 0633 09/26/21 0048  NA 137 138 140  K 4.4 3.9 3.6  CL 103 102 103  CO2 '25 26 28  '$ GLUCOSE 191* 170* 124*  BUN '14 13 17  '$ CREATININE 0.58* 0.53* 0.50*  CALCIUM 8.9 9.0 9.1     GFR: Estimated Creatinine Clearance: 94.9 mL/min (A) (by C-G formula based on SCr of 0.5 mg/dL (L)).  Liver Function Tests: Recent Labs  Lab 09/21/21 0938 09/24/21 0633 09/26/21 0048  AST 65* 41 49*  ALT 91* 92* 93*  ALKPHOS 47 43 43  BILITOT 0.3 0.3 0.2*  PROT 6.2* 6.2* 6.2*  ALBUMIN 2.6* 2.7* 2.7*      CBG: Recent Labs  Lab 09/26/21 1640 09/26/21 1937 09/26/21 2323 09/27/21 0352 09/27/21 0755  GLUCAP 170* 104* 88 145* 147*      Radiology Studies: No results found.     LOS: 44 days   Nyara Capell [employer] on [email or web address]  09/27/2021, 11:01 AM

## 2021-09-28 LAB — GLUCOSE, CAPILLARY
Glucose-Capillary: 111 mg/dL — ABNORMAL HIGH (ref 70–99)
Glucose-Capillary: 117 mg/dL — ABNORMAL HIGH (ref 70–99)
Glucose-Capillary: 156 mg/dL — ABNORMAL HIGH (ref 70–99)
Glucose-Capillary: 129 mg/dL — ABNORMAL HIGH (ref 70–99)
Glucose-Capillary: 116 mg/dL — ABNORMAL HIGH (ref 70–99)
Glucose-Capillary: 127 mg/dL — ABNORMAL HIGH (ref 70–99)

## 2021-09-28 NOTE — Progress Notes (Signed)
Speech Language Pathology Treatment: [name] Speaking valve  Patient Details Name: Joshua Donovan MRN: [id number] DOB: 03-10-1955 Today's Date: 09/28/2021 Time: 0930-1000 SLP Time Calculation (min) (ACUTE ONLY): 30 min  Assessment / Plan / Recommendation Clinical Impression  Pt making excellent progress today. Pt wore PMSV over 90 minutes with intermittent supervision and during PT session with no change in vital signs of discomfort. Pt able to coordinate deep breath with phonation and articulation with 80% accuracy during session to stay single words. SLP branched from bilabial syllables to 2 syllable bilabial words to family names to short phrases. Pt able to communicate with daughter via face time. Pt communicated wants and needs several times during session with breathy low volume phonation. Pt appears to use accessory muscles to activate deep inspiration for speech. Likely does not have diaphragmatic control. Pt encouraged to wear PMSV all waking hours with intermittent supervision for better secretion management, use of upper airway and opportunities to speak. Discussed with RN and placed sign. PMSV to be removed for heavy sleeping.    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 He  presented 08/02/21 with R foot pain and R-sided weakness. Transferred to [facility] 7/25. 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 S/p stent placement to the R vertebrobasilar junction 7/26, failed extubation. Trach and PEG placed 7/28. PMH: DM, GERD, TBI with prior ICH, HLD, HTN      SLP Plan  Continue with current plan of care       Recommendations for follow up therapy are one component of a multi-disciplinary discharge planning process, led by the attending physician.  Recommendations may be updated based on patient status, additional functional criteria and insurance authorization.    Recommendations         Patient may use Passy-Muir Speech Valve: During all waking hours (remove during sleep) PMSV Supervision: Intermittent         Plan: Continue with current plan of care           Nikia Mangino, [name]  09/28/2021, 11:27 AM

## 2021-09-28 NOTE — Progress Notes (Signed)
Physical Therapy Treatment Patient Details Name: Joshua Donovan MRN: [id number] DOB: 03-15-1955 Today's Date: 09/28/2021   History of Present Illness Pt is a 66 y.o. male who presented 08/02/21 with R foot pain and R-sided weakness. Transferred to [facility] 7/25. 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 S/p stent placement to the R vertebrobasilar junction 7/26, failed extubation. Trach and PEG placed 7/28. S/p bronchoscopic evaluation, oral reintubation and tracheostomy removal with stoma packing 7/31 due to trach site bleeding. S/p trach revision 8/2. PMH: DM, GERD, TBI with prior ICH, HLD, HTN    PT Comments    PROM BUE/LE. Bed placed in chair position to attempt activation of core muscles. Bed remained in chair position at end of session. Pillows placed under BUE for support/positioning. Towel roll on right side of head/neck to maintain neutral.    Recommendations for follow up therapy are one component of a multi-disciplinary discharge planning process, led by the attending physician.  Recommendations may be updated based on patient status, additional functional criteria and insurance authorization.  Follow Up Recommendations  PT at Long-term acute care hospital Can patient physically be transported by private vehicle: No   Assistance Recommended at Discharge Frequent or constant Supervision/Assistance  Patient can return home with the following Assistance with cooking/housework;Two people to help with walking and/or transfers;Two people to help with bathing/dressing/bathroom;Assistance with feeding;Direct supervision/assist for  medications management;Direct supervision/assist for financial management;Assist for transportation;Help with stairs or ramp for entrance   Equipment Recommendations  Other (comment) (defer to next venue)    Recommendations for Other Services       Precautions / Restrictions Precautions Precautions: Fall;Other (comment) Precaution Comments: trach collar, PEG, SBP < 180, prevalon boots, copious trach secretions, watch HR     Mobility  Bed Mobility Overal bed mobility: Needs Assistance Bed Mobility: Rolling Rolling: Total assist         General bed mobility comments: Bed placed in chair position.    Transfers                        Ambulation/Gait                   Stairs             Wheelchair Mobility    Modified Rankin (Stroke Patients Only)       Balance                                            Cognition Arousal/Alertness: Awake/alert Behavior During Therapy: WFL for tasks assessed/performed Overall Cognitive Status: Difficult to assess                                 General Comments: PSV in place but little to no phonation. Pt primarily mouthing words.        Exercises Other Exercises Other Exercises: PROM BUE/LE    General Comments General comments (skin integrity, edema, etc.): VSS on 5L via trach collar with PMV  in place      Pertinent Vitals/Pain Pain Assessment Pain Assessment: Faces Faces Pain Scale: Hurts a little bit Pain Location: R knee and R elbow/wrist with ROM Pain Descriptors / Indicators: Discomfort, Grimacing Pain Intervention(s): Limited activity within patient's tolerance    Home Living                          Prior Function            PT Goals (current goals can now be found in the care plan section) Acute Rehab PT Goals Patient Stated Goal: not stated Progress towards PT goals: Progressing toward goals    Frequency    Min 3X/week       PT Plan Current plan remains appropriate    Co-evaluation              AM-PAC PT "6 Clicks" Mobility   Outcome Measure  Help needed turning from your back to your side while in a flat bed without using bedrails?: Total Help needed moving from lying on your back to sitting on the side of a flat bed without using bedrails?: Total Help needed moving to and from a bed to a chair (including a wheelchair)?: Total Help needed standing up from a chair using your arms (e.g., wheelchair or bedside chair)?: Total Help needed to walk in hospital room?: Total Help needed climbing 3-5 steps with a railing? : Total 6 Click Score: 6    End of Session Equipment Utilized During Treatment: Oxygen Activity Tolerance: Patient tolerated treatment well Patient left: in bed;with call bell/phone within reach;with bed alarm set Nurse Communication: Mobility status;Need for lift equipment PT Visit Diagnosis: Muscle weakness (generalized) (M62.81);Other symptoms and signs involving the nervous system (R29.898);Other abnormalities of gait and mobility (R26.89);Other (comment) Hemiplegia - Right/Left: Right Hemiplegia - dominant/non-dominant: Dominant Hemiplegia - caused by: Cerebral infarction     Time: 1030-1102 PT Time Calculation (min) (ACUTE ONLY): 32 min  Charges:  $Therapeutic Exercise: 23-37 mins                     Joshua Donovan, PT  Office # [phone] Pager [phone]    Joshua Donovan 09/28/2021, 12:59 PM

## 2021-09-28 NOTE — Progress Notes (Signed)
[facility] PROGRESS NOTE   Joshua Donovan PJK:[id number] DOB: 1955-10-13 DOA: 08/03/2021  PCP: [facility] [place]  Brief History/Interval Summary: Patient with history of ICH, type II DM, HTN, HLD presented to [facility] on 7/24 with right hand numbness and weakness, initially found to have a small brainstem stroke and ventral medulla.  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  He was intubated and transferred to [facility] for interventional radiology intervention.  Remained in the intensive care unit.  Now with tracheostomy and PEG tube placement.    7/24 presented to [facility], ventral medulla CVA 7/25 tx to [facility], treated with Cleviprex. 7/26 cerebral angiogram with stent placement to right vertebro-basilar junction, failed extubation, MRI brain> mild extension of stroke, now involving bilateral medial medullary, patent basilar artery and R VBJ stent  7/28 bedside tracheostomy by PCCM and PEG tube placement by general surgery 7/30 Hypertension persist despite adding oral agents, glucose remains elevated as well 7/31 Bleeding around trach site, bright red blood.  Underwent bronchoscopy, Trach Removed, patient orally reintubated, stoma packed. 8/2 ENT took patient to OR for trach redo 8/9 transferred to medical floor on tracheostomy now. 8/12 vomiting after bolus feeding with worsening hypoxia. 8/13 copious bleeding from the heparin injection site controlled with pressure dressing. 8/14 bleeding has resolved.  Trach secretions blood-tinged. 8/15 insurance denied LTAC appeal.  CSW working on SNF. 8/16>8/22: 25 beat run of NSVT on 8/21 8/28: peer to peer for LTAC looks like this was denied- Interval x-ray showed basilar opacities--respiratory culture ordered, hypertonic saline started scopolamine discontinued 8/29: V. tach runs-increased metoprolol Resolved 9/2: Slightly increased work of breathing-low-grade temps 99 for  chest x-ray showed no interval changes 9/5:  Ancef started 2/2 secretions --no wbc or fever   Consultants: Neurology.  Critical care medicine.   Subjective/Interval History: No complaints offered.  Unable to verbalize due to tracheostomy but was able to move his head.     Assessment/Plan:  Acute stroke of medulla oblongata Stent to right vertebrobasilar junction on 7/26.  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   Continue aspirin, brilinta and lipitor Neurology signed off on 8/4. Will need outpatient follow up. Continue PT/OT/SLP > seeking SNF   Acute respiratory failure with hypoxia due to aspiration pneumonia/hospital-acquired MSSA pneumonia.   CXR on 9/3 shows bibasilar atelectasis/infiltrate.  Concern about Enterobacter/MSSA HAP Tracheostomy since 7/31, last exchanged 9/8. Followed by PCCM. Completed ancef 9/15 - 9/12. Repeat CXR 9/12 showed only atelectasis. Hypertonic saline nebs and chest physiotherapy per PCCM. Scopolamine patch for secretions. Trach care per respiratory and PCCM.  Pulmonary toilet Respiratory status remained stable.   Alcohol abuse:   Continue thiamine, multivitamin and folic acid No evidence for withdrawal symptoms.   Uncontrolled DM-2 with hyperglycemia, and hyperlipidemia A1c 8.3% on 7/25.  Patient noted to be on Levemir and SSI.  CBGs are reasonably well controlled.   Essential hypertension Reasonably well controlled.  Noted to be on amlodipine, carvedilol, HCTZ.    Mildly abnormal LFTs Relatively stable AST and ALT with normal Bili/AlkPhos. RUQ U/S unremarkable.  Check periodically.   Hepatitis panel is unremarkable.   NSVT Stable. Coreg increased on 9/2. Cardiology saw patient previously on 8/2   Oropharyngeal dysphagia status post PEG tube placement Continue tube feed 82m/hr and free water [id number] via PEG. SLP following   Hypokalemia  Remains on scheduled potassium twice a day.  Check potassium levels periodically.  Last checked on 9/17  when it was 3.6.  Tracheostomy complication/bleeding In the setting of DAPT.  Resolved. Tracheostomy revised by ENT   Small sacral decubitus prior to admission Continue monitoring    DVT Prophylaxis: SCDs Code Status: Full code Family Communication: No family at bedside Disposition Plan: Waiting on placement  Status is: Inpatient Remains inpatient appropriate because: Acute stroke with respiratory failure status post tracheostomy and PEG tube      Medications: Scheduled:  amLODipine  10 mg Per Tube Daily   aspirin  81 mg Per Tube Daily   atorvastatin  80 mg Per Tube Daily   carvedilol  37.5 mg Per Tube BID WC   diclofenac Sodium  2 g Topical QID   feeding supplement (PROSource TF20)  60 mL Per Tube Daily   fiber supplement (BANATROL TF)  60 mL Per Tube BID   folic acid  1 mg Per Tube Daily   free water  100 mL Per Tube Q4H   guaiFENesin  10 mL Per Tube Q4H   hydrochlorothiazide  25 mg Per Tube Daily   insulin aspart  0-9 Units Subcutaneous Q4H   insulin aspart  10 Units Subcutaneous Q4H   insulin detemir  30 Units Subcutaneous BID   multivitamin with minerals  1 tablet Per Tube Daily   mouth rinse  15 mL Mouth Rinse Q2H   polyethylene glycol  17 g Per Tube Daily   potassium chloride  40 mEq Per Tube BID   sodium chloride HYPERTONIC  4 mL Nebulization BID   thiamine  100 mg Per Tube Daily   ticagrelor  90 mg Per Tube BID   Continuous:  feeding supplement ([name]) 1,000 mL (09/27/21 1726)   JXB:JYNWGNFAOZHYQ **OR** acetaminophen (TYLENOL) oral liquid 160 mg/5 mL **OR** acetaminophen, bisacodyl, hydrALAZINE, labetalol, mouth rinse, senna-docusate  Antibiotics: Anti-infectives (From admission, onward)    Start     Dose/Rate Route Frequency Ordered Stop   09/14/21 1445  ceFAZolin (ANCEF) IVPB 2g/100 mL premix        2 g 200 mL/hr over 30 Minutes Intravenous Every 8 hours 09/14/21 1356 09/21/21 0709   08/11/21 1400  ceFEPIme (MAXIPIME) 2 g in sodium  chloride 0.9 % 100 mL IVPB        2 g 200 mL/hr over 30 Minutes Intravenous Every 8 hours 08/11/21 0957 08/16/21 0633   08/09/21 1300  cefTRIAXone (ROCEPHIN) 1 g in sodium chloride 0.9 % 100 mL IVPB  Status:  Discontinued        1 g 200 mL/hr over 30 Minutes Intravenous Every 24 hours 08/09/21 1151 08/11/21 0957   08/08/21 1200  ceFEPIme (MAXIPIME) 2 g in sodium chloride 0.9 % 100 mL IVPB  Status:  Discontinued        2 g 200 mL/hr over 30 Minutes Intravenous Every 8 hours 08/08/21 1104 08/09/21 1151   08/06/21 1500  cefTRIAXone (ROCEPHIN) 2 g in sodium chloride 0.9 % 100 mL IVPB  Status:  Discontinued        2 g 200 mL/hr over 30 Minutes Intravenous Every 24 hours 08/06/21 1421 08/08/21 1104   08/04/21 1006  ceFAZolin (ANCEF) 2-4 GM/100ML-% IVPB       Note to Pharmacy: Margaretmary Dys E: cabinet override      08/04/21 1006 08/04/21 2214       Objective:  Vital Signs  Vitals:   09/28/21 0323 09/28/21 0722 09/28/21 0849 09/28/21 0851  BP: (!) 142/82 130/79    Pulse: 86 84 84   Resp: (!) 28 (!) 26  15   Temp: 97.9 F (36.6 C) 98.1 F (36.7 C)    TempSrc: Oral Oral    SpO2: 96% 97% 98% 95%  Weight:      Height:        Intake/Output Summary (Last 24 hours) at 09/28/2021 0955 Last data filed at 09/28/2021 0337 Gross per 24 hour  Intake --  Output 400 ml  Net -400 ml    Filed Weights   09/20/21 0500 09/21/21 0500 09/22/21 0459  Weight: 85.7 kg 85.7 kg 85.7 kg    General appearance: Awake alert.  In no distress Tracheostomy noted Resp: Normal effort.  Coarse breath sounds bilaterally.  No wheezing or rhonchi. Cardio: S1-S2 is normal regular.  No S3-S4.  No rubs murmurs or bruit GI: Abdomen is soft.  Nontender nondistended.  Bowel sounds are present normal.  No masses organomegaly     Lab Results:  Data Reviewed: I have personally reviewed following labs and reports of the imaging studies  CBC: Recent Labs  Lab 09/26/21 0048  WBC 8.2  HGB 12.7*  HCT 38.6*  MCV  84.6  PLT 339     Basic Metabolic Panel: Recent Labs  Lab 09/24/21 0633 09/26/21 0048  NA 138 140  K 3.9 3.6  CL 102 103  CO2 26 28  GLUCOSE 170* 124*  BUN 13 17  CREATININE 0.53* 0.50*  CALCIUM 9.0 9.1     GFR: Estimated Creatinine Clearance: 94.9 mL/min (A) (by C-G formula based on SCr of 0.5 mg/dL (L)).  Liver Function Tests: Recent Labs  Lab 09/24/21 0633 09/26/21 0048  AST 41 49*  ALT 92* 93*  ALKPHOS 43 43  BILITOT 0.3 0.2*  PROT 6.2* 6.2*  ALBUMIN 2.7* 2.7*      CBG: Recent Labs  Lab 09/27/21 1723 09/27/21 1947 09/27/21 2313 09/28/21 0336 09/28/21 0749  GLUCAP 102* 112* 124* 111* 117*      Radiology Studies: DG CHEST PORT 1 VIEW  Result Date: 09/27/2021 CLINICAL DATA:  Pneumonia. EXAM: PORTABLE CHEST 1 VIEW COMPARISON:  September 20, 2021. FINDINGS: Stable cardiomediastinal silhouette. Tracheostomy tube is unchanged in position. Minimal bibasilar subsegmental atelectasis is noted. Bony thorax is unremarkable. IMPRESSION: Minimal bibasilar subsegmental atelectasis. Electronically Signed   By: Marijo Conception M.D.   On: 09/27/2021 13:52       LOS: 12 days   Guynell Kleiber [employer] on [email or web address]  09/28/2021, 9:55 AM

## 2021-09-28 NOTE — Evaluation (Signed)
Clinical/Bedside Swallow Evaluation Patient Details  Name: Joshua Donovan MRN: [id number] Date of Birth: August 08, 1955  Today's Date: 09/28/2021 Time: SLP Start Time (ACUTE ONLY): 0930 SLP Stop Time (ACUTE ONLY): 1000 SLP Time Calculation (min) (ACUTE ONLY): 30 min  Past Medical History:  Past Medical History:  Diagnosis Date   Diabetes mellitus without complication ([place])    GERD (gastroesophageal reflux disease)    Hemorrhage in the brain ([place]) 08/2015   High cholesterol    Hyperlipidemia    Hypertension    Hypertensive emergency 08/03/2021   Past Surgical History:  Past Surgical History:  Procedure Laterality Date   COLONOSCOPY WITH PROPOFOL N/A 09/08/2017   Procedure: COLONOSCOPY WITH PROPOFOL;  Surgeon: Lucilla Lame, MD;  Location: [place];  Service: Endoscopy;  Laterality: N/A;  Diabetic - oral meds   IR ANGIO INTRA EXTRACRAN SEL COM CAROTID INNOMINATE BILAT MOD SED  08/04/2021   IR ANGIO VERTEBRAL SEL SUBCLAVIAN INNOMINATE UNI L MOD SED  08/04/2021   IR ANGIO VERTEBRAL SEL VERTEBRAL UNI R MOD SED  08/04/2021   IR CT HEAD LTD  08/04/2021   IR INTRA CRAN STENT  08/04/2021   IR US GUIDE VASC ACCESS RIGHT  08/04/2021   partial intestine removed     approx date: 1980   POLYPECTOMY  09/08/2017   Procedure: POLYPECTOMY;  Surgeon: Lucilla Lame, MD;  Location: [place];  Service: Endoscopy;;   RADIOLOGY WITH ANESTHESIA N/A 08/04/2021   Procedure: Angioplasty/stenting of vertebrobasilar stenosis;  Surgeon: Luanne Bras, MD;  Location: [place];  Service: Radiology;  Laterality: N/A;   TRACHEOSTOMY TUBE PLACEMENT N/A 08/11/2021   Procedure: TRACHEOSTOMY;  Surgeon: Skotnicki, Meghan A, DO;  Location: [facility];  Service: ENT;  Laterality: N/A;   HPI:  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 He  presented 08/02/21 with R foot pain and R-sided weakness. Transferred to [facility] 7/25. MRI 7/26 revealed interval expansion of previously identified ventral medullary infarct,  extending posteriorly to traverse the medulla to the floor of the fourth ventricle, new scattered  small volume ischemic infarcts involving the right cerebellum as well as the cortical aspects of the right greater than left occipital lobes, and single punctate focus of associated petechial hemorrhage at the right cerebellum. S/p stent placement to the R vertebrobasilar junction 7/26, failed extubation. Trach and PEG placed 7/28. PMH: DM, GERD, TBI with prior ICH, HLD, HTN    Assessment / Plan / Recommendation  Clinical Impression  Pt demonstrates promising ability that could indicate potential to trial modified diet. Pt does have reduced lingual ROM and strength, but was able to achieve posterior propulsion of ice and initaite swallow. Swallow did appear weak with very little laryngeal elevation palpable. Hyoid excursion present. Will need instrumental assessment, FEES to determine ability to swallow as no signs of aspiration observed though impairment suspected. SLP Visit Diagnosis: Dysphagia, oropharyngeal phase (R13.12)    Aspiration Risk  Severe aspiration risk    Diet Recommendation NPO   Medication Administration: Via alternative means    Other  Recommendations      Recommendations for follow up therapy are one component of a multi-disciplinary discharge planning process, led by the attending physician.  Recommendations may be updated based on patient status, additional functional criteria and insurance authorization.  Follow up Recommendations        Assistance Recommended at Discharge    Functional Status Assessment    Frequency and Duration  Prognosis        Swallow Study   General HPI: 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 He  presented 08/02/21 with R foot pain and R-sided weakness. Transferred to [facility] 7/25. MRI 7/26 revealed interval expansion of previously identified ventral medullary infarct, extending posteriorly to traverse the medulla to the  floor of the fourth ventricle, new scattered  small volume ischemic infarcts involving the right cerebellum as well as the cortical aspects of the right greater than left occipital lobes, and single punctate focus of associated petechial hemorrhage at the right cerebellum. S/p stent placement to the R vertebrobasilar junction 7/26, failed extubation. Trach and PEG placed 7/28. PMH: DM, GERD, TBI with prior ICH, HLD, HTN Type of Study: Bedside Swallow Evaluation Diet Prior to this Study: PEG tube Temperature Spikes Noted: No Respiratory Status: Trach Collar History of Recent Intubation: No Behavior/Cognition: Alert;Cooperative;Pleasant mood Oral Cavity Assessment: Within Functional Limits Oral Care Completed by SLP: Yes Oral Cavity - Dentition: Adequate natural dentition Self-Feeding Abilities: Total assist Patient Positioning: Upright in bed Baseline Vocal Quality: Breathy Volitional Cough: Weak Volitional Swallow: Able to elicit    Oral/Motor/Sensory Function Overall Oral Motor/Sensory Function: Moderate impairment Facial ROM: Reduced left;Suspected CN VII (facial) dysfunction Facial Symmetry: Abnormal symmetry left;Suspected CN VII (facial) dysfunction Facial Strength: Reduced left;Suspected CN VII (facial) dysfunction Lingual ROM: Suspected CN XII (hypoglossal) dysfunction Lingual Symmetry: Within Functional Limits Lingual Strength: Reduced Mandible: Within Functional Limits   Ice Chips Ice chips: Impaired Presentation: Spoon Oral Phase Impairments: Impaired mastication Pharyngeal Phase Impairments: Multiple swallows;Decreased hyoid-laryngeal movement   Thin Liquid Thin Liquid: Not tested    Nectar Thick Nectar Thick Liquid: Not tested   Honey Thick Honey Thick Liquid: Not tested   Puree Puree: Not tested   Solid     Solid: Not tested      [name] 09/28/2021,11:39 AM

## 2021-09-29 LAB — GLUCOSE, CAPILLARY
Glucose-Capillary: 108 mg/dL — ABNORMAL HIGH (ref 70–99)
Glucose-Capillary: 166 mg/dL — ABNORMAL HIGH (ref 70–99)
Glucose-Capillary: 182 mg/dL — ABNORMAL HIGH (ref 70–99)
Glucose-Capillary: 127 mg/dL — ABNORMAL HIGH (ref 70–99)
Glucose-Capillary: 91 mg/dL (ref 70–99)
Glucose-Capillary: 165 mg/dL — ABNORMAL HIGH (ref 70–99)

## 2021-09-29 MED ORDER — POLYETHYLENE GLYCOL 3350 17 G PO PACK
17.0000 g | PACK | Freq: Two times a day (BID) | ORAL | Status: DC
  Administered 2021-09-29 – 2021-10-10 (×20): 17 g
  Filled 2021-09-29 (×24): qty 1

## 2021-09-29 MED ORDER — SENNOSIDES-DOCUSATE SODIUM 8.6-50 MG PO TABS
2.0000 | ORAL_TABLET | Freq: Two times a day (BID) | ORAL | Status: DC
  Administered 2021-09-29 – 2021-10-10 (×23): 2
  Filled 2021-09-29 (×25): qty 2

## 2021-09-29 NOTE — Progress Notes (Signed)
Physical Therapy Treatment Patient Details Name: Joshua Donovan MRN: [id number] DOB: 12-20-1955 Today's Date: 09/29/2021   History of Present Illness Pt is a 66 y.o. male who presented 08/02/21 with R foot pain and R-sided weakness. Transferred to [facility] 7/25. 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 S/p stent placement to the R vertebrobasilar junction 7/26, failed extubation. Trach and PEG placed 7/28. S/p bronchoscopic evaluation, oral reintubation and tracheostomy removal with stoma packing 7/31 due to trach site bleeding. S/p trach revision 8/2. PMH: DM, GERD, TBI with prior ICH, HLD, HTN    PT Comments    Focused session on increasing pt's core and cervical strength through cervical AAROM exercises while seated EOB. Pt requiring TA x2 for all bed mobility and sitting balance still due to lack of core musculature activation, but he is displaying improved cervical extension strength and endurance. Will continue to follow acutely. Current recommendations remain appropriate.    Recommendations for follow up therapy are one component of a multi-disciplinary discharge planning process, led by the attending physician.  Recommendations may be updated based on patient status, additional functional criteria and insurance authorization.  Follow Up Recommendations  PT at Long-term acute care hospital Can patient physically be transported by private vehicle: No   Assistance Recommended at Discharge Frequent or constant Supervision/Assistance  Patient can return home with the following Assistance with cooking/housework;Two people to help  with walking and/or transfers;Two people to help with bathing/dressing/bathroom;Assistance with feeding;Direct supervision/assist for medications management;Direct supervision/assist for financial management;Assist for transportation;Help with stairs or ramp for entrance   Equipment Recommendations  Other (comment) (defer to next venue)    Recommendations for Other Services       Precautions / Restrictions Precautions Precautions: Fall;Other (comment) Precaution Comments: trach collar, PEG, SBP < 180, prevalon boots, copious trach secretions, watch HR Restrictions Weight Bearing Restrictions: No     Mobility  Bed Mobility Overal bed mobility: Needs Assistance Bed Mobility: Supine to Sit, Sit to Supine     Supine to sit: Total assist, +2 for physical assistance, +2 for safety/equipment Sit to supine: Total assist, +2 for physical assistance   General bed mobility comments: TAx2 for supine <> sit transition EOB, supporting neck throughout    Transfers                   General transfer comment: deferred    Ambulation/Gait               General Gait Details: unable   Stairs             Wheelchair Mobility    Modified Rankin (Stroke Patients Only) Modified Rankin (Stroke Patients Only) Pre-Morbid Rankin Score: No symptoms Modified Rankin: Severe disability     Balance Overall balance assessment: Needs assistance Sitting-balance support: No upper extremity supported, Feet supported Sitting balance-Leahy Scale: Zero Sitting balance - Comments: TA for sitting balance EOB       Standing balance comment: unable                            Cognition Arousal/Alertness: Awake/alert Behavior During Therapy: WFL for tasks assessed/performed Overall Cognitive  Status: Difficult to assess                                 General Comments: Follows commands for neck movement with extra time. Difficult to assess due to trach         Exercises Other Exercises Other Exercises: AAROM cervical extension sitting EOB, sustaining position for up to ~15 sec at points until pt fatigued Other Exercises: AAROM cervical lateral flexion sitting EOB Other Exercises: AAROM cervical rotation sitting EOB    General Comments General comments (skin integrity, edema, etc.): VSS with trach collar      Pertinent Vitals/Pain Pain Assessment Pain Assessment: Faces Faces Pain Scale: Hurts a little bit Pain Location: generalized grimacing with mobility Pain Descriptors / Indicators: Discomfort, Grimacing Pain Intervention(s): Limited activity within patient's tolerance, Monitored during session, Repositioned    Home Living                          Prior Function            PT Goals (current goals can now be found in the care plan section) Acute Rehab PT Goals Patient Stated Goal: agreeable to session PT Goal Formulation: Patient unable to participate in goal setting Time For Goal Achievement: 10/01/21 Potential to Achieve Goals: Fair Progress towards PT goals: Progressing toward goals    Frequency    Min 3X/week      PT Plan Current plan remains appropriate    Co-evaluation              AM-PAC PT "6 Clicks" Mobility   Outcome Measure  Help needed turning from your back to your side while in a flat bed without using bedrails?: Total Help needed moving from lying on your back to sitting on the side of a flat bed without using bedrails?: Total Help needed moving to and from a bed to a chair (including a wheelchair)?: Total Help needed standing up from a chair using your arms (e.g., wheelchair or bedside chair)?: Total Help needed to walk in hospital room?: Total Help needed climbing 3-5 steps with a railing? : Total 6 Click Score: 6    End of Session Equipment Utilized During Treatment: Oxygen Activity Tolerance: Patient tolerated treatment well Patient left: in bed;with call bell/phone  within reach   PT Visit Diagnosis: Muscle weakness (generalized) (M62.81);Other symptoms and signs involving the nervous system (R29.898);Other abnormalities of gait and mobility (R26.89);Other (comment) Hemiplegia - Right/Left: Right Hemiplegia - dominant/non-dominant: Dominant Hemiplegia - caused by: Cerebral infarction     Time: 5102-5852 PT Time Calculation (min) (ACUTE ONLY): 18 min  Charges:  $Therapeutic Exercise: 8-22 mins                     Moishe Spice, PT, DPT Acute Rehabilitation Services  Office: [place] 09/29/2021, 5:53 PM

## 2021-09-29 NOTE — Progress Notes (Signed)
[facility] PROGRESS NOTE   Joshua Donovan NMM:[id number] DOB: 01-01-1956 DOA: 08/03/2021  PCP: [facility] [place]  Brief History/Interval Summary: Patient with history of ICH, type II DM, HTN, HLD presented to [facility] on 7/24 with right hand numbness and weakness, initially found to have a small brainstem stroke and ventral medulla.  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  He was intubated and transferred to [facility] for interventional radiology intervention.  Remained in the intensive care unit.  Now with tracheostomy and PEG tube placement.    7/24 presented to [facility], ventral medulla CVA 7/25 tx to [facility], treated with Cleviprex. 7/26 cerebral angiogram with stent placement to right vertebro-basilar junction, failed extubation, MRI brain> mild extension of stroke, now involving bilateral medial medullary, patent basilar artery and R VBJ stent  7/28 bedside tracheostomy by PCCM and PEG tube placement by general surgery 7/30 Hypertension persist despite adding oral agents, glucose remains elevated as well 7/31 Bleeding around trach site, bright red blood.  Underwent bronchoscopy, Trach Removed, patient orally reintubated, stoma packed. 8/2 ENT took patient to OR for trach redo 8/9 transferred to medical floor on tracheostomy now. 8/12 vomiting after bolus feeding with worsening hypoxia. 8/13 copious bleeding from the heparin injection site controlled with pressure dressing. 8/14 bleeding has resolved.  Trach secretions blood-tinged. 8/15 insurance denied LTAC appeal.  CSW working on SNF. 8/16>8/22: 25 beat run of NSVT on 8/21 8/28: peer to peer for LTAC looks like this was denied- Interval x-ray showed basilar opacities--respiratory culture ordered, hypertonic saline started scopolamine discontinued 8/29: V. tach runs-increased metoprolol Resolved 9/2: Slightly increased work of breathing-low-grade temps 99 for  chest x-ray showed no interval changes 9/5:  Ancef started 2/2 secretions --no wbc or fever   Consultants: Neurology.  Critical care medicine.   Subjective/Interval History: No overnight events.  Patient denies any complaints.  Unable to verbalize due to tracheostomy but is able to shake his head.  I am able to read his lips at times.   Assessment/Plan:  Acute stroke of medulla oblongata Stent to right vertebrobasilar junction on 7/26.  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   Continue aspirin, brilinta and lipitor Neurology signed off on 8/4. Will need outpatient follow up. Continue PT/OT/SLP > seeking SNF   Acute respiratory failure with hypoxia due to aspiration pneumonia/hospital-acquired MSSA pneumonia.   CXR on 9/3 shows bibasilar atelectasis/infiltrate.  Concern about Enterobacter/MSSA HAP Tracheostomy since 7/31, last exchanged 9/8. Followed by PCCM. Completed ancef 9/15 - 9/12.  Hypertonic saline nebs and chest physiotherapy per PCCM. Scopolamine patch for secretions. Trach care per respiratory and PCCM.  Pulmonary toilet Respiratory status remains stable.  Chest x-ray showed atelectasis.   Alcohol abuse:   Continue thiamine, multivitamin and folic acid No evidence for withdrawal symptoms.   Uncontrolled DM-2 with hyperglycemia, and hyperlipidemia A1c 8.3% on 7/25.  Patient noted to be on Levemir and SSI.  CBGs are reasonably well controlled.   Essential hypertension Reasonably well controlled.  Noted to be on amlodipine, carvedilol, HCTZ.    Mildly abnormal LFTs Relatively stable AST and ALT with normal Bili/AlkPhos. RUQ U/S unremarkable.  Check periodically.   Hepatitis panel is unremarkable.   NSVT Stable. Coreg increased on 9/2. Cardiology saw patient last on 8/2   Oropharyngeal dysphagia status post PEG tube placement Continue tube feedings.  He is also getting free water.   Hypokalemia  Remains on scheduled potassium twice a day.  Check potassium levels  periodically.  Last checked on 9/17 when it was 3.6.  Check labs tomorrow.   Tracheostomy complication/bleeding In the setting of DAPT.  Resolved. Tracheostomy revised by ENT   Small sacral decubitus prior to admission Continue monitoring    DVT Prophylaxis: SCDs Code Status: Full code Family Communication: No family at bedside Disposition Plan: Waiting on placement  Status is: Inpatient Remains inpatient appropriate because: Acute stroke with respiratory failure status post tracheostomy and PEG tube      Medications: Scheduled:  amLODipine  10 mg Per Tube Daily   aspirin  81 mg Per Tube Daily   atorvastatin  80 mg Per Tube Daily   carvedilol  37.5 mg Per Tube BID WC   diclofenac Sodium  2 g Topical QID   feeding supplement (PROSource TF20)  60 mL Per Tube Daily   fiber supplement (BANATROL TF)  60 mL Per Tube BID   folic acid  1 mg Per Tube Daily   free water  100 mL Per Tube Q4H   guaiFENesin  10 mL Per Tube Q4H   hydrochlorothiazide  25 mg Per Tube Daily   insulin aspart  0-9 Units Subcutaneous Q4H   insulin aspart  10 Units Subcutaneous Q4H   insulin detemir  30 Units Subcutaneous BID   multivitamin with minerals  1 tablet Per Tube Daily   mouth rinse  15 mL Mouth Rinse Q2H   polyethylene glycol  17 g Per Tube BID   potassium chloride  40 mEq Per Tube BID   senna-docusate  2 tablet Per Tube BID   sodium chloride HYPERTONIC  4 mL Nebulization BID   thiamine  100 mg Per Tube Daily   ticagrelor  90 mg Per Tube BID   Continuous:  feeding supplement ([name]) 1,000 mL (09/29/21 0200)   PFX:[id number] **OR** acetaminophen (TYLENOL) oral liquid 160 mg/5 mL **OR** acetaminophen, bisacodyl, hydrALAZINE, labetalol, mouth rinse  Antibiotics: Anti-infectives (From admission, onward)    Start     Dose/Rate Route Frequency Ordered Stop   09/14/21 1445  ceFAZolin (ANCEF) IVPB 2g/100 mL premix        2 g 200 mL/hr over 30 Minutes Intravenous Every 8 hours  09/14/21 1356 09/21/21 0709   08/11/21 1400  ceFEPIme (MAXIPIME) 2 g in sodium chloride 0.9 % 100 mL IVPB        2 g 200 mL/hr over 30 Minutes Intravenous Every 8 hours 08/11/21 0957 08/16/21 0633   08/09/21 1300  cefTRIAXone (ROCEPHIN) 1 g in sodium chloride 0.9 % 100 mL IVPB  Status:  Discontinued        1 g 200 mL/hr over 30 Minutes Intravenous Every 24 hours 08/09/21 1151 08/11/21 0957   08/08/21 1200  ceFEPIme (MAXIPIME) 2 g in sodium chloride 0.9 % 100 mL IVPB  Status:  Discontinued        2 g 200 mL/hr over 30 Minutes Intravenous Every 8 hours 08/08/21 1104 08/09/21 1151   08/06/21 1500  cefTRIAXone (ROCEPHIN) 2 g in sodium chloride 0.9 % 100 mL IVPB  Status:  Discontinued        2 g 200 mL/hr over 30 Minutes Intravenous Every 24 hours 08/06/21 1421 08/08/21 1104   08/04/21 1006  ceFAZolin (ANCEF) 2-4 GM/100ML-% IVPB       Note to Pharmacy: Margaretmary Dys E: cabinet override      08/04/21 1006 08/04/21 2214       Objective:  Vital Signs  Vitals:   09/29/21 0318 09/29/21 0323 09/29/21  0349 09/29/21 0723  BP: 130/85 130/85  133/80  Pulse: 81 85 84 88  Resp: (!) 21 (!) 30 19 (!) 29  Temp: 98.7 F (37.1 C)   98.1 F (36.7 C)  TempSrc: Axillary   Oral  SpO2: 93% 94%  91%  Weight:      Height:       No intake or output data in the 24 hours ending 09/29/21 0929  Filed Weights   09/20/21 0500 09/21/21 0500 09/22/21 0459  Weight: 85.7 kg 85.7 kg 85.7 kg    General appearance: Awake alert.  In no distress Tracheostomy noted Resp: Normal effort.  Diminished air entry at the bases.  No wheezing or rhonchi. Cardio: S1-S2 is normal regular.  No S3-S4.  No rubs murmurs or bruit GI: Abdomen is soft.  Nontender nondistended.  Bowel sounds are present normal.  No masses organomegaly PEG tube noted     Lab Results:  Data Reviewed: I have personally reviewed following labs and reports of the imaging studies  CBC: Recent Labs  Lab 09/26/21 0048  WBC 8.2  HGB 12.7*   HCT 38.6*  MCV 84.6  PLT 339     Basic Metabolic Panel: Recent Labs  Lab 09/24/21 0633 09/26/21 0048  NA 138 140  K 3.9 3.6  CL 102 103  CO2 26 28  GLUCOSE 170* 124*  BUN 13 17  CREATININE 0.53* 0.50*  CALCIUM 9.0 9.1     GFR: Estimated Creatinine Clearance: 94.9 mL/min (A) (by C-G formula based on SCr of 0.5 mg/dL (L)).  Liver Function Tests: Recent Labs  Lab 09/24/21 0633 09/26/21 0048  AST 41 49*  ALT 92* 93*  ALKPHOS 43 43  BILITOT 0.3 0.2*  PROT 6.2* 6.2*  ALBUMIN 2.7* 2.7*      CBG: Recent Labs  Lab 09/28/21 1525 09/28/21 1917 09/28/21 2316 09/29/21 0319 09/29/21 0723  GLUCAP 129* 116* 127* 108* 166*      Radiology Studies: DG CHEST PORT 1 VIEW  Result Date: 09/27/2021 CLINICAL DATA:  Pneumonia. EXAM: PORTABLE CHEST 1 VIEW COMPARISON:  September 20, 2021. FINDINGS: Stable cardiomediastinal silhouette. Tracheostomy tube is unchanged in position. Minimal bibasilar subsegmental atelectasis is noted. Bony thorax is unremarkable. IMPRESSION: Minimal bibasilar subsegmental atelectasis. Electronically Signed   By: Marijo Conception M.D.   On: 09/27/2021 13:52       LOS: 37 days   Dracen Reigle [employer] on [email or web address]  09/29/2021, 9:29 AM

## 2021-09-29 NOTE — Progress Notes (Signed)
Assumed care of patient, focused assessment complete

## 2021-09-30 LAB — GLUCOSE, CAPILLARY
Glucose-Capillary: 165 mg/dL — ABNORMAL HIGH (ref 70–99)
Glucose-Capillary: 142 mg/dL — ABNORMAL HIGH (ref 70–99)
Glucose-Capillary: 154 mg/dL — ABNORMAL HIGH (ref 70–99)
Glucose-Capillary: 96 mg/dL (ref 70–99)
Glucose-Capillary: 114 mg/dL — ABNORMAL HIGH (ref 70–99)
Glucose-Capillary: 129 mg/dL — ABNORMAL HIGH (ref 70–99)

## 2021-09-30 LAB — CBC
HCT: 42.1 % (ref 39.0–52.0)
Hemoglobin: 13.6 g/dL (ref 13.0–17.0)
MCH: 27.5 pg (ref 26.0–34.0)
MCHC: 32.3 g/dL (ref 30.0–36.0)
MCV: 85.1 fL (ref 80.0–100.0)
Platelets: 356 10*3/uL (ref 150–400)
RBC: 4.95 MIL/uL (ref 4.22–5.81)
RDW: 13.8 % (ref 11.5–15.5)
WBC: 10 10*3/uL (ref 4.0–10.5)
nRBC: 0 % (ref 0.0–0.2)

## 2021-09-30 LAB — COMPREHENSIVE METABOLIC PANEL
ALT: 101 U/L — ABNORMAL HIGH (ref 0–44)
AST: 39 U/L (ref 15–41)
Albumin: 2.9 g/dL — ABNORMAL LOW (ref 3.5–5.0)
Alkaline Phosphatase: 46 U/L (ref 38–126)
Anion gap: 10 (ref 5–15)
BUN: 16 mg/dL (ref 8–23)
CO2: 26 mmol/L (ref 22–32)
Calcium: 9.3 mg/dL (ref 8.9–10.3)
Chloride: 104 mmol/L (ref 98–111)
Creatinine, Ser: 0.54 mg/dL — ABNORMAL LOW (ref 0.61–1.24)
GFR, Estimated: >60 mL/min (ref >60)
Glucose, Bld: 155 mg/dL — ABNORMAL HIGH (ref 70–99)
Potassium: 3.7 mmol/L (ref 3.5–5.1)
Sodium: 140 mmol/L (ref 135–145)
Total Bilirubin: 0.6 mg/dL (ref 0.3–1.2)
Total Protein: 6.5 g/dL (ref 6.5–8.1)

## 2021-09-30 LAB — MAGNESIUM: Magnesium: 1.8 mg/dL (ref 1.7–2.4)

## 2021-09-30 MED ORDER — ZINC OXIDE 40 % EX OINT
TOPICAL_OINTMENT | CUTANEOUS | Status: DC | PRN
  Administered 2021-11-07: 1 via TOPICAL
  Filled 2021-09-30 (×10): qty 57

## 2021-09-30 NOTE — Progress Notes (Signed)
[facility] PROGRESS NOTE   Joshua Donovan YOV:[id number] DOB: 01/16/1955 DOA: 08/03/2021  PCP: [facility] [place]  Brief History/Interval Summary: Patient with history of ICH, type II DM, HTN, HLD presented to [facility] hospital on 7/24 with right hand numbness and weakness, initially found to have a small brainstem stroke and ventral medulla.  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  He was intubated and transferred to [facility] for interventional radiology intervention.  Remained in the intensive care unit.  Now with tracheostomy and PEG tube placement.    7/24 presented to [facility], ventral medulla CVA 7/25 tx to [facility], treated with Cleviprex. 7/26 cerebral angiogram with stent placement to right vertebro-basilar junction, failed extubation, MRI brain> mild extension of stroke, now involving bilateral medial medullary, patent basilar artery and R VBJ stent  7/28 bedside tracheostomy by PCCM and PEG tube placement by general surgery 7/30 Hypertension persist despite adding oral agents, glucose remains elevated as well 7/31 Bleeding around trach site, bright red blood.  Underwent bronchoscopy, Trach Removed, patient orally reintubated, stoma packed. 8/2 ENT took patient to OR for trach redo 8/9 transferred to medical floor on tracheostomy now. 8/12 vomiting after bolus feeding with worsening hypoxia. 8/13 copious bleeding from the heparin injection site controlled with pressure dressing. 8/14 bleeding has resolved.  Trach secretions blood-tinged. 8/15 insurance denied LTAC appeal.  CSW working on SNF. 8/16>8/22: 25 beat run of NSVT on 8/21 8/28: peer to peer for LTAC looks like this was denied- Interval x-ray showed basilar opacities--respiratory culture ordered, hypertonic saline started scopolamine discontinued 8/29: V. tach runs-increased metoprolol Resolved 9/2: Slightly increased work of breathing-low-grade temps 99 for  chest x-ray showed no interval changes 9/5:  Ancef started 2/2 secretions --no wbc or fever   Consultants: Neurology.  Critical care medicine.   Subjective/Interval History: No overnight events noted.  Patient shakes his head when asked if he had any pain issues or any other complaints.     Assessment/Plan:  Acute stroke of medulla oblongata Stent to right vertebrobasilar junction on 7/26. 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   Continue aspirin, brilinta and lipitor Neurology signed off on 8/4. Will need outpatient follow up. Continue PT/OT/SLP > seeking SNF   Acute respiratory failure with hypoxia due to aspiration pneumonia/hospital-acquired MSSA pneumonia.   CXR on 9/3 shows bibasilar atelectasis/infiltrate.  Concern about Enterobacter/MSSA HAP Tracheostomy since 7/31, last exchanged 9/8. Followed by PCCM. Completed ancef 9/15 - 9/12.  Hypertonic saline nebs and chest physiotherapy per PCCM. Scopolamine patch for secretions. Trach care per respiratory and PCCM.  Pulmonary toilet Respiratory status remains stable.  Chest x-ray showed atelectasis.   Alcohol abuse:   Continue thiamine, multivitamin and folic acid No evidence for withdrawal symptoms.   Uncontrolled DM-2 with hyperglycemia, and hyperlipidemia A1c 8.3% on 7/25.  Patient noted to be on Levemir and SSI.  CBGs are reasonably well controlled.   Essential hypertension Reasonably well controlled.  Noted to be on amlodipine, carvedilol, HCTZ.    Mildly abnormal LFTs Relatively stable AST and ALT with normal Bili/AlkPhos. RUQ U/S unremarkable.  Check periodically.   Hepatitis panel is unremarkable.   NSVT Stable. Coreg increased on 9/2. Cardiology saw patient last on 8/2   Oropharyngeal dysphagia status post PEG tube placement Continue tube feedings.  He is also getting free water.   Hypokalemia  Remains on scheduled potassium twice a day.  Check potassium levels periodically.  Noted to be 3.7 this morning.    Tracheostomy complication/bleeding  In the setting of DAPT.  Resolved. Tracheostomy revised by ENT   Small sacral decubitus prior to admission Continue monitoring    DVT Prophylaxis: SCDs Code Status: Full code Family Communication: No family at bedside Disposition Plan: Waiting on placement.  LTAC has been denied twice by insurance.  Status is: Inpatient Remains inpatient appropriate because: Acute stroke with respiratory failure status post tracheostomy and PEG tube      Medications: Scheduled:  amLODipine  10 mg Per Tube Daily   aspirin  81 mg Per Tube Daily   atorvastatin  80 mg Per Tube Daily   carvedilol  37.5 mg Per Tube BID WC   diclofenac Sodium  2 g Topical QID   feeding supplement (PROSource TF20)  60 mL Per Tube Daily   fiber supplement (BANATROL TF)  60 mL Per Tube BID   folic acid  1 mg Per Tube Daily   free water  100 mL Per Tube Q4H   guaiFENesin  10 mL Per Tube Q4H   hydrochlorothiazide  25 mg Per Tube Daily   insulin aspart  0-9 Units Subcutaneous Q4H   insulin aspart  10 Units Subcutaneous Q4H   insulin detemir  30 Units Subcutaneous BID   multivitamin with minerals  1 tablet Per Tube Daily   mouth rinse  15 mL Mouth Rinse Q2H   polyethylene glycol  17 g Per Tube BID   potassium chloride  40 mEq Per Tube BID   senna-docusate  2 tablet Per Tube BID   sodium chloride HYPERTONIC  4 mL Nebulization BID   thiamine  100 mg Per Tube Daily   ticagrelor  90 mg Per Tube BID   Continuous:  feeding supplement ([name]) 1,000 mL (09/29/21 0200)   VHQ:[id number] **OR** acetaminophen (TYLENOL) oral liquid 160 mg/5 mL **OR** acetaminophen, bisacodyl, hydrALAZINE, labetalol, liver oil-zinc oxide, mouth rinse  Antibiotics: Anti-infectives (From admission, onward)    Start     Dose/Rate Route Frequency Ordered Stop   09/14/21 1445  ceFAZolin (ANCEF) IVPB 2g/100 mL premix        2 g 200 mL/hr over 30 Minutes Intravenous Every 8 hours 09/14/21 1356  09/21/21 0709   08/11/21 1400  ceFEPIme (MAXIPIME) 2 g in sodium chloride 0.9 % 100 mL IVPB        2 g 200 mL/hr over 30 Minutes Intravenous Every 8 hours 08/11/21 0957 08/16/21 0633   08/09/21 1300  cefTRIAXone (ROCEPHIN) 1 g in sodium chloride 0.9 % 100 mL IVPB  Status:  Discontinued        1 g 200 mL/hr over 30 Minutes Intravenous Every 24 hours 08/09/21 1151 08/11/21 0957   08/08/21 1200  ceFEPIme (MAXIPIME) 2 g in sodium chloride 0.9 % 100 mL IVPB  Status:  Discontinued        2 g 200 mL/hr over 30 Minutes Intravenous Every 8 hours 08/08/21 1104 08/09/21 1151   08/06/21 1500  cefTRIAXone (ROCEPHIN) 2 g in sodium chloride 0.9 % 100 mL IVPB  Status:  Discontinued        2 g 200 mL/hr over 30 Minutes Intravenous Every 24 hours 08/06/21 1421 08/08/21 1104   08/04/21 1006  ceFAZolin (ANCEF) 2-4 GM/100ML-% IVPB       Note to Pharmacy: Margaretmary Dys E: cabinet override      08/04/21 1006 08/04/21 2214       Objective:  Vital Signs  Vitals:   09/30/21 0305 09/30/21 0828 09/30/21 0907 09/30/21 0924  BP:  137/78 138/86 138/86   Pulse: 86 87 90   Resp: (!) 25 (!) 27 (!) 32   Temp: 97.8 F (36.6 C) 99.2 F (37.3 C)    TempSrc: Oral Oral    SpO2: 95% 97%  98%  Weight:      Height:        Intake/Output Summary (Last 24 hours) at 09/30/2021 1021 Last data filed at 09/29/2021 1958 Gross per 24 hour  Intake --  Output 200 ml  Net -200 ml    Filed Weights   09/20/21 0500 09/21/21 0500 09/22/21 0459  Weight: 85.7 kg 85.7 kg 85.7 kg    General appearance: Noted to be awake.  No distress. Tracheostomy is noted. Resp: Diminished air entry at the bases.  No wheezing or rhonchi. Cardio: S1-S2 is normal regular.  No S3-S4.  No rubs murmurs or bruit GI: Abdomen is soft.  Nontender nondistended.  Bowel sounds are present normal.  No masses organomegaly.  PEG tube is noted    Lab Results:  Data Reviewed: I have personally reviewed following labs and reports of the imaging  studies  CBC: Recent Labs  Lab 09/26/21 0048 09/30/21 0150  WBC 8.2 10.0  HGB 12.7* 13.6  HCT 38.6* 42.1  MCV 84.6 85.1  PLT 339 356     Basic Metabolic Panel: Recent Labs  Lab 09/24/21 0633 09/26/21 0048 09/30/21 0150  NA 138 140 140  K 3.9 3.6 3.7  CL 102 103 104  CO2 '26 28 26  '$ GLUCOSE 170* 124* 155*  BUN '13 17 16  '$ CREATININE 0.53* 0.50* 0.54*  CALCIUM 9.0 9.1 9.3  MG  --   --  1.8     GFR: Estimated Creatinine Clearance: 94.9 mL/min (A) (by C-G formula based on SCr of 0.54 mg/dL (L)).  Liver Function Tests: Recent Labs  Lab 09/24/21 0633 09/26/21 0048 09/30/21 0150  AST 41 49* 39  ALT 92* 93* 101*  ALKPHOS 43 43 46  BILITOT 0.3 0.2* 0.6  PROT 6.2* 6.2* 6.5  ALBUMIN 2.7* 2.7* 2.9*      CBG: Recent Labs  Lab 09/29/21 1500 09/29/21 2003 09/29/21 2311 09/30/21 0346 09/30/21 0829  GLUCAP 127* 91 165* 165* 142*      Radiology Studies: No results found.     LOS: 11 days   Korbin Mapps [employer] on [email or web address]  09/30/2021, 10:21 AM

## 2021-09-30 NOTE — Progress Notes (Signed)
   09/29/21 2136  SLP Visit Information  SLP Received On 09/30/21  General Information  HPI 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 He  presented 08/02/21 with R foot pain and R-sided weakness. Transferred to [facility] 7/25. 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 S/p stent placement to the R vertebrobasilar junction 7/26, failed extubation. Trach and PEG placed 7/28. PMH: DM, GERD, TBI with prior ICH, HLD, HTN  Treatment Provided  Treatment provided Passy Muir Speaking valve  Cognitive-Linquistic Treatment  Treatment focused on Voice;Patient/family/caregiver education  Skilled Treatment see impression:  Visited pt briefly while Joelene Millin and another visitor were chatting with [name]. Repositioned, suctioned mouth, placed valve and cues Jovin to phonate and then branch to words. Pt able to communicate at word level with low breathy voice to visitors. Demonstrated placement and removal to [place]. Ok for her to place and remove valve as Geovanni has a cuffless trach and is tolerating well with intermittent supervision. Will f/u for further education and training.   Family/Caregiver Educated daughter Joelene Millin  Tracheostomy Shiley Flexible 6 mm Uncuffed  Placement Date/Time: 09/17/21 1105   Placed By: Self  Brand: Shiley Flexible  Size (mm): 6 mm  Style: Uncuffed  Status Secured  Site Assessment Clean;Dry  Site Care Dressing applied  Inner Cannula Care Changed/new  Ties Assessment Clean, Dry, Secure  Tracheostomy Equipment at bedside Yes and checklist posted at head of bed  Additional Tracheostomy Tube Assessment  Trach Collar  Period 24 hours a day  Secretion Description thin runny white  Level of Secretion Expectoration Tracheal  Ventilator Dependency  FiO2 (%) 28 %  PMSV Trial  PMSV was placed for 15 minutes  Able to redirect subglottic air through upper airway Yes  Able to Attain Phonation Yes  Voice Quality Low vocal intensity  Level of Secretion Expectoration with PMSV Oral  Breath Support for Phonation Severely decreased  Respirations During Trial 20  SpO2 During Trial 96 %  Intelligibility Intelligibility reduced  Able to Expectorate Secretions Yes  Word 50-74% accurate  Phrase Not tested  Sentence Not tested  Conversation Not tested  Assessment / Recommendations / Plan  Plan Continue with current plan of care  PMSV Recommendations  Patient may use Passy-Muir Speech Valve During all waking hours (remove during sleep);Caregiver trained to provide supervision  PMSV Supervision Intermittent  General Recommendations  Follow Up Recommendations Skilled nursing-short term rehab (<3 hours/day)  Assistance recommended at discharge Frequent or constant Supervision/Assistance  SLP Visit Diagnosis Dysphagia, oropharyngeal phase (R13.12)  Progression Toward Goals  Progression toward goals Progressing toward goals  Potential to Achieve Goals (ACUTE ONLY) Good  SLP Time Calculation  SLP Start Time (ACUTE ONLY) 1400  SLP Stop Time (ACUTE ONLY) 1410  SLP Time Calculation (min) (ACUTE ONLY) 10 min  SLP Evaluations  $ SLP Speech Visit 1 Visit  SLP Evaluations  $Speech Treatment for Individual 1 Procedure

## 2021-10-01 LAB — GLUCOSE, CAPILLARY
Glucose-Capillary: 91 mg/dL (ref 70–99)
Glucose-Capillary: 112 mg/dL — ABNORMAL HIGH (ref 70–99)
Glucose-Capillary: 187 mg/dL — ABNORMAL HIGH (ref 70–99)
Glucose-Capillary: 124 mg/dL — ABNORMAL HIGH (ref 70–99)
Glucose-Capillary: 117 mg/dL — ABNORMAL HIGH (ref 70–99)
Glucose-Capillary: 153 mg/dL — ABNORMAL HIGH (ref 70–99)

## 2021-10-01 NOTE — Progress Notes (Signed)
RT discussed trach change due today with Fonnie Mu, CCM. Hold off changing trach at this time due to secretions requiring CPT and hypertonic.  CCM will re-assess on 10/04/21.

## 2021-10-01 NOTE — Progress Notes (Signed)
CPT delayed until patient was done with bedside therapy.

## 2021-10-01 NOTE — Plan of Care (Signed)
  Problem: Clinical Measurements: Goal: Ability to maintain clinical measurements within normal limits will improve Outcome: Progressing Goal: Will remain free from infection Outcome: Progressing Goal: Diagnostic test results will improve Outcome: Progressing   Problem: Nutrition: Goal: Adequate nutrition will be maintained Outcome: Progressing   Problem: Elimination: Goal: Will not experience complications related to bowel motility Outcome: Progressing

## 2021-10-01 NOTE — Progress Notes (Signed)
Physical Therapy Treatment Patient Details Name: Joshua Donovan MRN: [id number] DOB: [birth year] Today's Date: 10/01/2021   History of Present Illness Pt is a 66 y.o. male who presented 08/02/21 with R foot pain and R-sided weakness. Transferred to [facility] 7/25. 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 S/p stent placement to the R vertebrobasilar junction 7/26, failed extubation. Trach and PEG placed 7/28. S/p bronchoscopic evaluation, oral reintubation and tracheostomy removal with stoma packing 7/31 due to trach site bleeding. S/p trach revision 8/2. PMH: DM, GERD, TBI with prior ICH, HLD, HTN    PT Comments    Pt was able to tolerate sitting EOB > 25 min with TA for balance while trying to communicate via utilizing the eye gaze machine. Pt also performing therapeutic exercises while sitting EOB, still only demonstrating activation in his cervical musculature. Pt is displaying improve cervical extension endurance and improved cervical rotation AROM, but remains limited in isolating lateral flexion of his neck. Noted trunk and all limb (stronger on R leg compared to L) extensor tone when his neck is actively extended. Will continue to follow acutely. Current recommendations remain appropriate.     Recommendations for follow up therapy are one component of a multi-disciplinary discharge planning process, led by the attending physician.  Recommendations may be updated based on patient status, additional functional criteria and insurance authorization.  Follow Up Recommendations  PT at Long-term acute care hospital Can patient physically  be transported by private vehicle: No   Assistance Recommended at Discharge Frequent or constant Supervision/Assistance  Patient can return home with the following Assistance with cooking/housework;Two people to help with walking and/or transfers;Two people to help with bathing/dressing/bathroom;Assistance with feeding;Direct supervision/assist for medications management;Direct supervision/assist for financial management;Assist for transportation;Help with stairs or ramp for entrance   Equipment Recommendations  Other (comment) (defer to next venue)    Recommendations for Other Services       Precautions / Restrictions Precautions Precautions: Fall;Other (comment) Precaution Comments: trach collar, PEG, SBP < 180, prevalon boots, copious trach secretions, watch HR Restrictions Weight Bearing Restrictions: No     Mobility  Bed Mobility Overal bed mobility: Needs Assistance Bed Mobility: Supine to Sit, Sit to Supine, Rolling Rolling: Total assist, +2 for physical assistance, +2 for safety/equipment   Supine to sit: Total assist, +2 for physical assistance, +2 for safety/equipment Sit to supine: Total assist, +2 for physical assistance   General bed mobility comments: TAx2 for supine <> sit transition EOB, supporting neck throughout. TAx2 to roll either direction for pericare    Transfers                   General transfer comment: deferred    Ambulation/Gait               General Gait Details: unable   Stairs             Wheelchair Mobility    Modified Rankin (Stroke Patients Only) Modified Rankin (Stroke Patients Only) Pre-Morbid Rankin Score: No symptoms Modified Rankin: Severe disability     Balance Overall balance assessment: Needs assistance Sitting-balance support: No upper extremity supported, Feet supported, Bilateral upper extremity supported Sitting balance-Leahy Scale: Zero  Sitting balance - Comments: TA for sitting balance EOB >25  min. Noted trunk and limb extensor tone when neck was actively extended. Unable to prop efficiently on extended arms with neck extended to not need TA still for balance. TA to lean laterally and return to midline. Pt drops head from midlione position intermittently and needs assistance to initiate lifting head again but then can actively lift majority fo the remaining of the way.       Standing balance comment: unable                            Cognition Arousal/Alertness: Awake/alert Behavior During Therapy: WFL for tasks assessed/performed Overall Cognitive Status: Difficult to assess                                 General Comments: Follows commands for neck movement with extra time. Difficult to assess due to trach. Tries to use eye gaze machine, but appears to need calibration for improved accuracy        Exercises Other Exercises Other Exercises: AAROM cervical extension sitting EOB, sustaining position for up to ~15 sec at points until pt fatigued Other Exercises: AAROM cervical lateral flexion sitting EOB Other Exercises: AAROM cervical rotation sitting EOB Other Exercises: use of eye gaze machine to try to communicate while sitting EOB >25 min Other Exercises: cues (but no activation noted) to adduct scapulas and use core to lean laterally EOB    General Comments General comments (skin integrity, edema, etc.): VSS with trach collar      Pertinent Vitals/Pain Pain Assessment Pain Assessment: Faces Faces Pain Scale: Hurts a little bit Pain Location: generalized grimacing with mobility Pain Descriptors / Indicators: Discomfort, Grimacing Pain Intervention(s): Limited activity within patient's tolerance, Monitored during session, Repositioned    Home Living                          Prior Function            PT Goals (current goals can now be found in the care plan section) Acute Rehab PT Goals Patient Stated Goal: agreeable to  session PT Goal Formulation: With patient Time For Goal Achievement: 10/15/21 Potential to Achieve Goals: Fair Progress towards PT goals: Progressing toward goals    Frequency    Min 3X/week      PT Plan Current plan remains appropriate    Co-evaluation PT/OT/SLP Co-Evaluation/Treatment: Yes Reason for Co-Treatment: Complexity of the patient's impairments (multi-system involvement);For patient/therapist safety;To address functional/ADL transfers PT goals addressed during session: Mobility/safety with mobility;Balance;Strengthening/ROM        AM-PAC PT "6 Clicks" Mobility   Outcome Measure  Help needed turning from your back to your side while in a flat bed without using bedrails?: Total Help needed moving from lying on your back to sitting on the side of a flat bed without using bedrails?: Total Help needed moving to and from a bed to a chair (including a wheelchair)?: Total Help needed standing up from a chair using your arms (e.g., wheelchair or bedside chair)?: Total Help needed to walk in hospital room?: Total Help needed climbing 3-5 steps with a railing? : Total 6 Click Score: 6    End of Session Equipment Utilized During Treatment: Oxygen Activity Tolerance: Patient tolerated treatment well Patient left: in bed;with call bell/phone within reach Nurse Communication: Mobility  status PT Visit Diagnosis: Muscle weakness (generalized) (M62.81);Other symptoms and signs involving the nervous system (R29.898);Other abnormalities of gait and mobility (R26.89);Other (comment) Hemiplegia - Right/Left: Right Hemiplegia - dominant/non-dominant: Dominant Hemiplegia - caused by: Cerebral infarction     Time: 4210-3128 PT Time Calculation (min) (ACUTE ONLY): 55 min  Charges:  $Therapeutic Exercise: 8-22 mins $Therapeutic Activity: 8-22 mins                     Moishe Spice, PT, DPT Acute Rehabilitation Services  Office: [place] 10/01/2021,  3:19 PM

## 2021-10-01 NOTE — Progress Notes (Signed)
[facility] PROGRESS NOTE   Valin Massie GMW:[id number] DOB: [birth year] DOA: 08/03/2021  PCP: [facility] [place]  Brief History/Interval Summary: Patient with history of ICH, type II DM, HTN, HLD presented to [facility] on 7/24 with right hand numbness and weakness, initially found to have a small brainstem stroke and ventral medulla.  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  He was intubated and transferred to [facility] for interventional radiology intervention.  Remained in the intensive care unit.  Now with tracheostomy and PEG tube placement.    7/24 presented to [facility], ventral medulla CVA 7/25 tx to [facility], treated with Cleviprex. 7/26 cerebral angiogram with stent placement to right vertebro-basilar junction, failed extubation, MRI brain> mild extension of stroke, now involving bilateral medial medullary, patent basilar artery and R VBJ stent  7/28 bedside tracheostomy by PCCM and PEG tube placement by general surgery 7/30 Hypertension persist despite adding oral agents, glucose remains elevated as well 7/31 Bleeding around trach site, bright red blood.  Underwent bronchoscopy, Trach Removed, patient orally reintubated, stoma packed. 8/2 ENT took patient to OR for trach redo 8/9 transferred to medical floor on tracheostomy now. 8/12 vomiting after bolus feeding with worsening hypoxia. 8/13 copious bleeding from the heparin injection site controlled with pressure dressing. 8/14 bleeding has resolved.  Trach secretions blood-tinged. 8/15 insurance denied LTAC appeal.  CSW working on SNF. 8/16>8/22: 25 beat run of NSVT on 8/21 8/28: peer to peer for LTAC looks like this was denied- Interval x-ray showed basilar opacities--respiratory culture ordered, hypertonic saline started scopolamine discontinued 8/29: V. tach runs-increased metoprolol Resolved 9/2: Slightly increased work of breathing-low-grade temps 99 for  chest x-ray showed no interval changes 9/5:  Ancef started 2/2 secretions --no wbc or fever    Consultants: Neurology.  Critical care medicine.   Subjective/Interval History: No overnight events noted.  Patient shakes his head when asked if he had any pain issues or any other complaints.    Assessment/Plan:  Acute stroke of medulla oblongata - Stent to right vertebrobasilar junction on 7/26. 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   - Continue aspirin, brilinta and lipitor - Neurology signed off on 8/4. Will need outpatient follow up. Continue PT/OT/SLP > seeking SNF   Acute respiratory failure with hypoxia due to aspiration pneumonia/hospital-acquired MSSA pneumonia.   - CXR on 9/3 shows bibasilar atelectasis/infiltrate.  Concern about Enterobacter/MSSA HAP - Tracheostomy since 7/31, last exchanged 9/8. Followed by PCCM. - Completed ancef 9/15 - 9/12.  - Hypertonic saline nebs and chest physiotherapy per PCCM. - Scopolamine patch for secretions. - Respiratory status remains stable  Alcohol abuse, history of:   Continue thiamine, multivitamin and folic acid supplement While outside the window for withdrawals at this point.   Uncontrolled DM-2 with hyperglycemia, and hyperlipidemia A1c 8.3% on 7/25.  Patient noted to be on Levemir and SSI.  CBGs are reasonably well controlled.   Essential hypertension Reasonably well controlled on amlodipine, carvedilol, HCTZ.    Mildly abnormal LFTs Remain within normal limits, hepatitis panel negative   NSVT Stable. Coreg increased on 9/2. Cardiology saw patient last on 8/2   Oropharyngeal dysphagia status post PEG tube placement Continue tube feedings.  He is also getting free water.   Hypokalemia  Remains on scheduled potassium twice a day.  Check potassium levels periodically.  Noted to be 3.7 this morning.   Tracheostomy complication/bleeding In the setting of DAPT.  Resolved. Tracheostomy revised by ENT   Small sacral decubitus  prior to admission Continue monitoring   DVT Prophylaxis: SCDs Code Status: Full code Family Communication: No family at bedside Disposition Plan: Waiting on placement.  LTAC has been denied twice by insurance.  Awaiting SNF bed availability.  Status is: Inpatient Remains inpatient appropriate because: Acute stroke with respiratory failure status post tracheostomy and PEG tube   Medications: Scheduled:  amLODipine  10 mg Per Tube Daily   aspirin  81 mg Per Tube Daily   atorvastatin  80 mg Per Tube Daily   carvedilol  37.5 mg Per Tube BID WC   diclofenac Sodium  2 g Topical QID   feeding supplement (PROSource TF20)  60 mL Per Tube Daily   fiber supplement (BANATROL TF)  60 mL Per Tube BID   folic acid  1 mg Per Tube Daily   free water  100 mL Per Tube Q4H   guaiFENesin  10 mL Per Tube Q4H   hydrochlorothiazide  25 mg Per Tube Daily   insulin aspart  0-9 Units Subcutaneous Q4H   insulin aspart  10 Units Subcutaneous Q4H   insulin detemir  30 Units Subcutaneous BID   multivitamin with minerals  1 tablet Per Tube Daily   mouth rinse  15 mL Mouth Rinse Q2H   polyethylene glycol  17 g Per Tube BID   potassium chloride  40 mEq Per Tube BID   senna-docusate  2 tablet Per Tube BID   sodium chloride HYPERTONIC  4 mL Nebulization BID   thiamine  100 mg Per Tube Daily   ticagrelor  90 mg Per Tube BID   Continuous:  feeding supplement ([name]) 1,000 mL (09/29/21 0200)   SJG:[id number] **OR** acetaminophen (TYLENOL) oral liquid 160 mg/5 mL **OR** acetaminophen, bisacodyl, hydrALAZINE, labetalol, liver oil-zinc oxide, mouth rinse  Antibiotics: Anti-infectives (From admission, onward)    Start     Dose/Rate Route Frequency Ordered Stop   09/14/21 1445  ceFAZolin (ANCEF) IVPB 2g/100 mL premix        2 g 200 mL/hr over 30 Minutes Intravenous Every 8 hours 09/14/21 1356 09/21/21 0709   08/11/21 1400  ceFEPIme (MAXIPIME) 2 g in sodium chloride 0.9 % 100 mL IVPB        2  g 200 mL/hr over 30 Minutes Intravenous Every 8 hours 08/11/21 0957 08/16/21 0633   08/09/21 1300  cefTRIAXone (ROCEPHIN) 1 g in sodium chloride 0.9 % 100 mL IVPB  Status:  Discontinued        1 g 200 mL/hr over 30 Minutes Intravenous Every 24 hours 08/09/21 1151 08/11/21 0957   08/08/21 1200  ceFEPIme (MAXIPIME) 2 g in sodium chloride 0.9 % 100 mL IVPB  Status:  Discontinued        2 g 200 mL/hr over 30 Minutes Intravenous Every 8 hours 08/08/21 1104 08/09/21 1151   08/06/21 1500  cefTRIAXone (ROCEPHIN) 2 g in sodium chloride 0.9 % 100 mL IVPB  Status:  Discontinued        2 g 200 mL/hr over 30 Minutes Intravenous Every 24 hours 08/06/21 1421 08/08/21 1104   08/04/21 1006  ceFAZolin (ANCEF) 2-4 GM/100ML-% IVPB       Note to Pharmacy: Margaretmary Dys E: cabinet override      08/04/21 1006 08/04/21 2214       Objective:  Vital Signs  Vitals:   09/30/21 1922 09/30/21 2007 09/30/21 2300 10/01/21 0300  BP: 129/82  (!) 133/95 133/83  Pulse: 84 85 76 82  Resp: 16 16  20 20  Temp: (!) 97.5 F (36.4 C)   98.1 F (36.7 C)  TempSrc: Oral   Oral  SpO2: 92% 95% 93% 91%  Weight:      Height:        Intake/Output Summary (Last 24 hours) at 10/01/2021 0739 Last data filed at 10/01/2021 0346 Gross per 24 hour  Intake --  Output 1150 ml  Net -1150 ml    Filed Weights   09/20/21 0500 09/21/21 0500 09/22/21 0459  Weight: 85.7 kg 85.7 kg 85.7 kg    General appearance: Noted to be awake.  No distress. Tracheostomy is noted. Resp: Diminished air entry at the bases.  No wheezing or rhonchi. Cardio: S1-S2 is normal regular.  No S3-S4.  No rubs murmurs or bruit GI: Abdomen is soft.  Nontender nondistended.  Bowel sounds are present normal.  No masses organomegaly.  PEG tube is noted    Lab Results:  Data Reviewed: I have personally reviewed following labs and reports of the imaging studies  CBC: Recent Labs  Lab 09/26/21 0048 09/30/21 0150  WBC 8.2 10.0  HGB 12.7* 13.6  HCT  38.6* 42.1  MCV 84.6 85.1  PLT 339 356     Basic Metabolic Panel: Recent Labs  Lab 09/26/21 0048 09/30/21 0150  NA 140 140  K 3.6 3.7  CL 103 104  CO2 28 26  GLUCOSE 124* 155*  BUN 17 16  CREATININE 0.50* 0.54*  CALCIUM 9.1 9.3  MG  --  1.8     GFR: Estimated Creatinine Clearance: 94.9 mL/min (A) (by C-G formula based on SCr of 0.54 mg/dL (L)).  Liver Function Tests: Recent Labs  Lab 09/26/21 0048 09/30/21 0150  AST 49* 39  ALT 93* 101*  ALKPHOS 43 46  BILITOT 0.2* 0.6  PROT 6.2* 6.5  ALBUMIN 2.7* 2.9*      CBG: Recent Labs  Lab 09/30/21 1130 09/30/21 1519 09/30/21 1924 09/30/21 2313 10/01/21 0327  GLUCAP 154* 96 114* 129* 91      Radiology Studies: No results found.     LOS: 72 days   [facility] Pager on [email or web address]  10/01/2021, 7:39 AM

## 2021-10-01 NOTE — Progress Notes (Signed)
Occupational Therapy Treatment Patient Details Name: Joshua Donovan MRN: [id number] DOB: 03/08/1955 Today's Date: 10/01/2021   History of present illness Pt is a 66 y.o. male who presented 08/02/21 with R foot pain and R-sided weakness. Transferred to [facility] 7/25. 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 S/p stent placement to the R vertebrobasilar junction 7/26, failed extubation. Trach and PEG placed 7/28. S/p bronchoscopic evaluation, oral reintubation and tracheostomy removal with stoma packing 7/31 due to trach site bleeding. S/p trach revision 8/2. PMH: DM, GERD, TBI with prior ICH, HLD, HTN   OT comments  Pt completed supine to sit EOB for greater than 20 mins with total +2 assist. He is still needing total assist for sitting balance while focusing on trunk and head control with incorporation of eye case communication device.  He is able to perform some cervical extension with mod assist and maintain upright head to midline for 10-20 second intervals. Recommend continued acute level OT at this time.     Recommendations for follow up therapy are one component of a multi-disciplinary discharge planning process, led by the attending physician.  Recommendations may be updated based on patient status, additional functional criteria and insurance authorization.    Follow Up Recommendations  OT at Long-term acute care hospital    Assistance Recommended at Discharge Frequent or constant Supervision/Assistance  Patient can return home with the following  Two people to help with walking and/or transfers;Two people to help with  bathing/dressing/bathroom;Assistance with cooking/housework;Assistance with feeding;Direct supervision/assist for medications management;Direct supervision/assist for financial management;Assist for transportation;Help with stairs or ramp for entrance   Equipment Recommendations  Other (comment) (TBD next venue of care)       Precautions / Restrictions Precautions Precautions: Fall;Other (comment) Precaution Comments: trach collar, PEG, SBP < 180, prevalon boots, copious trach secretions, watch HR Restrictions Weight Bearing Restrictions: No Other Position/Activity Restrictions: splint on LUE to manage lines       Mobility Bed Mobility Overal bed mobility: Needs Assistance Bed Mobility: Supine to Sit, Sit to Supine, Rolling Rolling: Total assist, +2 for physical assistance, +2 for safety/equipment Sidelying to sit: Total assist, +2 for physical assistance Supine to sit: Total assist, +2 for physical assistance, +2 for safety/equipment Sit to supine: Total assist, +2 for physical assistance   General bed mobility comments: Total +2 for all aspects of supine to sit and sit to supine.    Transfers                         Balance Overall balance assessment: Needs assistance Sitting-balance support: No upper extremity supported, Feet supported, Bilateral upper extremity supported Sitting balance-Leahy Scale: Zero                                     ADL either performed or assessed with clinical judgement   ADL Overall ADL's : Needs assistance/impaired Eating/Feeding: NPO Eating/Feeding Details (indicate cue type and reason): PEG tube  Functional mobility during ADLs: Total assistance;+2 for physical assistance (supine to sit EOB) General ADL Comments: Pt worked on sitting balance and head control EOB while integrating eye gaze assistive technology.  Total assist for sitting balance for greater than 20 mins  with max assist for head control.  Pt is able to complete some cervical extension with mod assist and maintain neutral position for intervals of 10-20 second intervals.  Noted some extensor posturing in trunk and extremites when attempting to initiate cervical movements.  Vitals remained stable throughout session with HR in the 80s and O2 sats               Cognition Arousal/Alertness: Awake/alert Behavior During Therapy: WFL for tasks assessed/performed Overall Cognitive Status: Difficult to assess (pt non-verbal overall and cannot reliably use assistive technology programmed for eye gaze)                                                General Comments VSS with trach collar    Pertinent Vitals/ Pain       Pain Assessment Pain Assessment: Faces Pain Score: 0-No pain         Frequency  Min 2X/week        Progress Toward Goals  OT Goals(current goals can now be found in the care plan section)  Progress towards OT goals: Progressing toward goals  Acute Rehab OT Goals Patient Stated Goal: None stated OT Goal Formulation: With patient Potential to Achieve Goals: Good  Plan Discharge plan remains appropriate    Co-evaluation    PT/OT/SLP Co-Evaluation/Treatment: Yes Reason for Co-Treatment: Complexity of the patient's impairments (multi-system involvement);For patient/therapist safety PT goals addressed during session: Mobility/safety with mobility;Balance;Strengthening/ROM OT goals addressed during session: ADL's and self-care      AM-PAC OT "6 Clicks" Daily Activity     Outcome Measure   Help from another person eating meals?: Total Help from another person taking care of personal grooming?: Total Help from another person toileting, which includes using toliet, bedpan, or urinal?: Total Help from another person bathing (including washing, rinsing, drying)?: Total Help from another person to put on and taking off regular upper body clothing?:  Total Help from another person to put on and taking off regular lower body clothing?: Total 6 Click Score: 6    End of Session Equipment Utilized During Treatment: Oxygen  OT Visit Diagnosis: Other symptoms and signs involving the nervous system (R29.898);Muscle weakness (generalized) (M62.81);Feeding difficulties (R63.3)   Activity Tolerance Patient tolerated treatment well   Patient Left in bed;with call bell/phone within reach   Nurse Communication Mobility status        Time: 5374-8270 OT Time Calculation (min): 55 min  Charges: OT General Charges $OT Visit: 1 Visit OT Treatments $Therapeutic Activity: 23-37 mins  Garold Sheeler OTR/L 10/01/2021, 4:50 PM

## 2021-10-02 LAB — GLUCOSE, CAPILLARY
Glucose-Capillary: 148 mg/dL — ABNORMAL HIGH (ref 70–99)
Glucose-Capillary: 101 mg/dL — ABNORMAL HIGH (ref 70–99)
Glucose-Capillary: 174 mg/dL — ABNORMAL HIGH (ref 70–99)
Glucose-Capillary: 96 mg/dL (ref 70–99)
Glucose-Capillary: 125 mg/dL — ABNORMAL HIGH (ref 70–99)
Glucose-Capillary: 149 mg/dL — ABNORMAL HIGH (ref 70–99)

## 2021-10-02 NOTE — Progress Notes (Signed)
[facility] PROGRESS NOTE   Joshua Donovan BMW:[id number] DOB: [birth year] DOA: 08/03/2021  PCP: [facility], [place]  Brief History/Interval Summary: Patient with history of ICH, type II DM, HTN, HLD presented to [facility] regional hospital on 7/24 with right hand numbness and weakness, initially found to have a small brainstem stroke and ventral medulla.  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  He was intubated and transferred to [facility] for interventional radiology intervention.  Remained in the intensive care unit.  Now with tracheostomy and PEG tube placement.    7/24 presented to [facility], ventral medulla CVA 7/25 tx to [facility], treated with Cleviprex. 7/26 cerebral angiogram with stent placement to right vertebro-basilar junction, failed extubation, MRI brain> mild extension of stroke, now involving bilateral medial medullary, patent basilar artery and R VBJ stent  7/28 bedside tracheostomy by PCCM and PEG tube placement by general surgery 7/30 Hypertension persist despite adding oral agents, glucose remains elevated as well 7/31 Bleeding around trach site, bright red blood.  Underwent bronchoscopy, Trach Removed, patient orally reintubated, stoma packed. 8/2 ENT took patient to OR for trach redo 8/9 transferred to medical floor on tracheostomy now. 8/12 vomiting after bolus feeding with worsening hypoxia. 8/13 copious bleeding from the heparin injection site controlled with pressure dressing. 8/14 bleeding has resolved.  Trach secretions blood-tinged. 8/15 insurance denied LTAC appeal.  CSW working on SNF. 8/16>8/22: 25 beat run of NSVT on 8/21 8/28: peer to peer for LTAC looks like this was denied- Interval x-ray showed basilar opacities--respiratory culture ordered, hypertonic saline started scopolamine discontinued 8/29: V. tach runs-increased metoprolol Resolved 9/2: Slightly increased work of breathing-low-grade temps 99 for  chest x-ray showed no interval changes 9/5:  Ancef started 2/2 secretions --no wbc or fever 9/12: Completed antibiotics - Medically stable for discharge -requires LTAC at discharge due to ongoing needs as outlined below. Continue to await safe disposition.  Consultants: Neurology.  Critical care medicine.   Subjective/Interval History: No overnight events noted.  Patient shakes his head when asked if he had any pain issues or any other complaints.    Assessment/Plan:  Acute stroke of medulla oblongata - Stent to right vertebrobasilar junction on 7/26. 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   - Continue aspirin, brilinta and lipitor - Neurology signed off on 8/4. Will need outpatient follow up. - Continue PT/OT/SLP > seeking SNF   Acute respiratory failure with hypoxia due to aspiration pneumonia/hospital-acquired MSSA pneumonia.   - CXR on 9/3 shows bibasilar atelectasis/infiltrate.  Concern about Enterobacter/MSSA HAP - Tracheostomy since 7/31, last exchanged 9/8. Followed by PCCM. - Completed ancef 9/5 - 9/12.  - Hypertonic saline nebs and chest physiotherapy per PCCM. - Scopolamine patch for secretions. - Respiratory status remains stable  Alcohol abuse, history of:   Continue thiamine, multivitamin and folic acid supplement While outside the window for withdrawals at this point.   Uncontrolled DM-2 with hyperglycemia, and hyperlipidemia A1c 8.3% on 7/25.  Patient noted to be on Levemir and SSI.  CBGs are reasonably well controlled.   Essential hypertension Reasonably well controlled on amlodipine, carvedilol, HCTZ.    Mildly abnormal LFTs Remain within normal limits, hepatitis panel negative   NSVT Stable. Coreg increased on 9/2. Cardiology saw patient last on 8/2   Oropharyngeal dysphagia status post PEG tube placement Continue tube feedings.  He is also getting free water.   Hypokalemia  Remains on scheduled potassium twice a day.  Check potassium levels periodically.   Noted to be  3.7 this morning.   Tracheostomy complication/bleeding In the setting of DAPT.  Resolved. Tracheostomy revised by ENT   Small sacral decubitus prior to admission Continue monitoring   DVT Prophylaxis: SCDs Code Status: Full code Family Communication: No family at bedside Disposition Plan: Waiting on placement.  LTAC has been denied twice by insurance.  Awaiting SNF bed availability.  Status is: Inpatient Remains inpatient appropriate because: Acute stroke with respiratory failure status post tracheostomy and PEG tube   Medications: Scheduled:  amLODipine  10 mg Per Tube Daily   aspirin  81 mg Per Tube Daily   atorvastatin  80 mg Per Tube Daily   carvedilol  37.5 mg Per Tube BID WC   diclofenac Sodium  2 g Topical QID   feeding supplement (PROSource TF20)  60 mL Per Tube Daily   fiber supplement (BANATROL TF)  60 mL Per Tube BID   folic acid  1 mg Per Tube Daily   free water  100 mL Per Tube Q4H   guaiFENesin  10 mL Per Tube Q4H   hydrochlorothiazide  25 mg Per Tube Daily   insulin aspart  0-9 Units Subcutaneous Q4H   insulin aspart  10 Units Subcutaneous Q4H   insulin detemir  30 Units Subcutaneous BID   multivitamin with minerals  1 tablet Per Tube Daily   mouth rinse  15 mL Mouth Rinse Q2H   polyethylene glycol  17 g Per Tube BID   potassium chloride  40 mEq Per Tube BID   senna-docusate  2 tablet Per Tube BID   sodium chloride HYPERTONIC  4 mL Nebulization BID   thiamine  100 mg Per Tube Daily   ticagrelor  90 mg Per Tube BID   Continuous:  feeding supplement ([name]) 1,000 mL (10/02/21 0520)   [id number] **OR** acetaminophen (TYLENOL) oral liquid 160 mg/5 mL **OR** acetaminophen, bisacodyl, hydrALAZINE, labetalol, liver oil-zinc oxide, mouth rinse  Antibiotics: Anti-infectives (From admission, onward)    Start     Dose/Rate Route Frequency Ordered Stop   09/14/21 1445  ceFAZolin (ANCEF) IVPB 2g/100 mL premix        2 g 200 mL/hr  over 30 Minutes Intravenous Every 8 hours 09/14/21 1356 09/21/21 0709   08/11/21 1400  ceFEPIme (MAXIPIME) 2 g in sodium chloride 0.9 % 100 mL IVPB        2 g 200 mL/hr over 30 Minutes Intravenous Every 8 hours 08/11/21 0957 08/16/21 0633   08/09/21 1300  cefTRIAXone (ROCEPHIN) 1 g in sodium chloride 0.9 % 100 mL IVPB  Status:  Discontinued        1 g 200 mL/hr over 30 Minutes Intravenous Every 24 hours 08/09/21 1151 08/11/21 0957   08/08/21 1200  ceFEPIme (MAXIPIME) 2 g in sodium chloride 0.9 % 100 mL IVPB  Status:  Discontinued        2 g 200 mL/hr over 30 Minutes Intravenous Every 8 hours 08/08/21 1104 08/09/21 1151   08/06/21 1500  cefTRIAXone (ROCEPHIN) 2 g in sodium chloride 0.9 % 100 mL IVPB  Status:  Discontinued        2 g 200 mL/hr over 30 Minutes Intravenous Every 24 hours 08/06/21 1421 08/08/21 1104   08/04/21 1006  ceFAZolin (ANCEF) 2-4 GM/100ML-% IVPB       Note to Pharmacy: Margaretmary Dys E: cabinet override      08/04/21 1006 08/04/21 2214      Objective: Vital Signs Vitals:   10/01/21 2332 10/02/21 0300 10/02/21  0320 10/02/21 0500  BP:  132/82    Pulse:  77 79   Resp:  (!) 25 (!) 26   Temp:  98.1 F (36.7 C)    TempSrc:  Oral    SpO2: 98% 98% 98%   Weight:    79.8 kg  Height:        Intake/Output Summary (Last 24 hours) at 10/02/2021 0751 Last data filed at 10/01/2021 1603 Gross per 24 hour  Intake --  Output 400 ml  Net -400 ml    Filed Weights   09/21/21 0500 09/22/21 0459 10/02/21 0500  Weight: 85.7 kg 85.7 kg 79.8 kg    General appearance: Noted to be awake.  No distress. Tracheostomy is noted. Resp: Diminished air entry at the bases.  No wheezing or rhonchi. Cardio: S1-S2 is normal regular.  No S3-S4.  No rubs murmurs or bruit GI: Abdomen is soft.  Nontender nondistended.  Bowel sounds are present normal.  No masses organomegaly.  PEG tube is noted    Lab Results:  Data Reviewed: I have personally reviewed following labs and reports of the  imaging studies  CBC: Recent Labs  Lab 09/26/21 0048 09/30/21 0150  WBC 8.2 10.0  HGB 12.7* 13.6  HCT 38.6* 42.1  MCV 84.6 85.1  PLT 339 356     Basic Metabolic Panel: Recent Labs  Lab 09/26/21 0048 09/30/21 0150  NA 140 140  K 3.6 3.7  CL 103 104  CO2 28 26  GLUCOSE 124* 155*  BUN 17 16  CREATININE 0.50* 0.54*  CALCIUM 9.1 9.3  MG  --  1.8     GFR: Estimated Creatinine Clearance: 92 mL/min (A) (by C-G formula based on SCr of 0.54 mg/dL (L)).  Liver Function Tests: Recent Labs  Lab 09/26/21 0048 09/30/21 0150  AST 49* 39  ALT 93* 101*  ALKPHOS 43 46  BILITOT 0.2* 0.6  PROT 6.2* 6.5  ALBUMIN 2.7* 2.9*      CBG: Recent Labs  Lab 10/01/21 1136 10/01/21 1600 10/01/21 1941 10/01/21 2324 10/02/21 0307  GLUCAP 187* 124* 117* 153* 148*      Radiology Studies: No results found.     LOS: 1 days   [facility] Pager on [email or web address]  10/02/2021, 7:51 AM

## 2021-10-03 LAB — GLUCOSE, CAPILLARY
Glucose-Capillary: 175 mg/dL — ABNORMAL HIGH (ref 70–99)
Glucose-Capillary: 163 mg/dL — ABNORMAL HIGH (ref 70–99)
Glucose-Capillary: 131 mg/dL — ABNORMAL HIGH (ref 70–99)
Glucose-Capillary: 150 mg/dL — ABNORMAL HIGH (ref 70–99)
Glucose-Capillary: 87 mg/dL (ref 70–99)
Glucose-Capillary: 114 mg/dL — ABNORMAL HIGH (ref 70–99)
Glucose-Capillary: 183 mg/dL — ABNORMAL HIGH (ref 70–99)

## 2021-10-03 NOTE — Progress Notes (Signed)
[facility] PROGRESS NOTE   Joshua Donovan HWE:[id number] DOB: 05/28/1955 DOA: 08/03/2021  PCP: [facility], [place]  Brief History/Interval Summary: Patient with history of ICH, type II DM, HTN, HLD presented to [facility] hospital on 7/24 with right hand numbness and weakness, initially found to have a small brainstem stroke and ventral medulla.  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  He was intubated and transferred to [facility] for interventional radiology intervention.  Remained in the intensive care unit.  Now with tracheostomy and PEG tube placement.    7/24 presented to [place], ventral medulla CVA 7/25 tx to [facility], treated with Cleviprex. 7/26 cerebral angiogram with stent placement to right vertebro-basilar junction, failed extubation, MRI brain> mild extension of stroke, now involving bilateral medial medullary, patent basilar artery and R VBJ stent  7/28 bedside tracheostomy by PCCM and PEG tube placement by general surgery 7/30 Hypertension persist despite adding oral agents, glucose remains elevated as well 7/31 Bleeding around trach site, bright red blood.  Underwent bronchoscopy, Trach Removed, patient orally reintubated, stoma packed. 8/2 ENT took patient to OR for trach redo 8/9 transferred to medical floor on tracheostomy now. 8/12 vomiting after bolus feeding with worsening hypoxia. 8/13 copious bleeding from the heparin injection site controlled with pressure dressing. 8/14 bleeding has resolved.  Trach secretions blood-tinged. 8/15 insurance denied LTAC appeal.  CSW working on SNF. 8/16>8/22: 25 beat run of NSVT on 8/21 8/28: peer to peer for LTAC looks like this was denied- Interval x-ray showed basilar opacities--respiratory culture ordered, hypertonic saline started scopolamine discontinued 8/29: V. tach runs-increased metoprolol Resolved 9/2: Slightly increased work of breathing-low-grade temps 99 for  chest x-ray showed no interval changes 9/5:  Ancef started 2/2 secretions --no wbc or fever 9/12: Completed antibiotics - Medically stable for discharge -requires LTAC at discharge due to ongoing needs as outlined below. Continue to await safe disposition.  Consultants: Neurology.  Critical care medicine.   Subjective/Interval History: No overnight events noted.  Patient shakes his head when asked if he had any pain issues or any other complaints.    Assessment/Plan:  Acute stroke of medulla oblongata - Stent to right vertebrobasilar junction on 7/26. 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   - Continue aspirin, brilinta and lipitor - Neurology signed off on 8/4. Will need outpatient follow up. - Continue PT/OT/SLP > seeking SNF   Acute respiratory failure with hypoxia due to aspiration pneumonia/hospital-acquired MSSA pneumonia.   - CXR on 9/3 shows bibasilar atelectasis/infiltrate.  Concern about Enterobacter/MSSA HAP - Tracheostomy since 7/31, last exchanged 9/8. Followed by PCCM. - Completed ancef 9/5 - 9/12.  - Hypertonic saline nebs and chest physiotherapy per PCCM. - Scopolamine patch for secretions. - Respiratory status remains stable  Alcohol abuse, history of:   Continue thiamine, multivitamin and folic acid supplement While outside the window for withdrawals at this point.   Uncontrolled DM-2 with hyperglycemia, and hyperlipidemia A1c 8.3% on 7/25.  Patient noted to be on Levemir and SSI.  CBGs are reasonably well controlled.   Essential hypertension Reasonably well controlled on amlodipine, carvedilol, HCTZ.    Mildly abnormal LFTs Remain within normal limits, hepatitis panel negative   NSVT Stable. Coreg increased on 9/2. Cardiology saw patient last on 8/2   Oropharyngeal dysphagia status post PEG tube placement Continue tube feedings.  He is also getting free water.   Hypokalemia  Remains on scheduled potassium twice a day.  Check potassium levels periodically.   Noted to be  3.7 this morning.   Tracheostomy complication/bleeding In the setting of DAPT.  Resolved. Tracheostomy revised by ENT   Small sacral decubitus prior to admission Continue monitoring   DVT Prophylaxis: SCDs Code Status: Full code Family Communication: No family at bedside Disposition Plan: Waiting on placement.  LTAC has been denied twice by insurance.  Awaiting SNF bed availability.  Status is: Inpatient Remains inpatient appropriate because: Acute stroke with respiratory failure status post tracheostomy and PEG tube   Medications: Scheduled:  amLODipine  10 mg Per Tube Daily   aspirin  81 mg Per Tube Daily   atorvastatin  80 mg Per Tube Daily   carvedilol  37.5 mg Per Tube BID WC   diclofenac Sodium  2 g Topical QID   feeding supplement (PROSource TF20)  60 mL Per Tube Daily   fiber supplement (BANATROL TF)  60 mL Per Tube BID   folic acid  1 mg Per Tube Daily   free water  100 mL Per Tube Q4H   guaiFENesin  10 mL Per Tube Q4H   hydrochlorothiazide  25 mg Per Tube Daily   insulin aspart  0-9 Units Subcutaneous Q4H   insulin aspart  10 Units Subcutaneous Q4H   insulin detemir  30 Units Subcutaneous BID   multivitamin with minerals  1 tablet Per Tube Daily   mouth rinse  15 mL Mouth Rinse Q2H   polyethylene glycol  17 g Per Tube BID   potassium chloride  40 mEq Per Tube BID   senna-docusate  2 tablet Per Tube BID   thiamine  100 mg Per Tube Daily   ticagrelor  90 mg Per Tube BID   Continuous:  feeding supplement ([name]) 1,000 mL (10/03/21 0052)   [id number] **OR** acetaminophen (TYLENOL) oral liquid 160 mg/5 mL **OR** acetaminophen, bisacodyl, hydrALAZINE, labetalol, liver oil-zinc oxide, mouth rinse  Antibiotics: Anti-infectives (From admission, onward)    Start     Dose/Rate Route Frequency Ordered Stop   09/14/21 1445  ceFAZolin (ANCEF) IVPB 2g/100 mL premix        2 g 200 mL/hr over 30 Minutes Intravenous Every 8 hours 09/14/21  1356 09/21/21 0709   08/11/21 1400  ceFEPIme (MAXIPIME) 2 g in sodium chloride 0.9 % 100 mL IVPB        2 g 200 mL/hr over 30 Minutes Intravenous Every 8 hours 08/11/21 0957 08/16/21 0633   08/09/21 1300  cefTRIAXone (ROCEPHIN) 1 g in sodium chloride 0.9 % 100 mL IVPB  Status:  Discontinued        1 g 200 mL/hr over 30 Minutes Intravenous Every 24 hours 08/09/21 1151 08/11/21 0957   08/08/21 1200  ceFEPIme (MAXIPIME) 2 g in sodium chloride 0.9 % 100 mL IVPB  Status:  Discontinued        2 g 200 mL/hr over 30 Minutes Intravenous Every 8 hours 08/08/21 1104 08/09/21 1151   08/06/21 1500  cefTRIAXone (ROCEPHIN) 2 g in sodium chloride 0.9 % 100 mL IVPB  Status:  Discontinued        2 g 200 mL/hr over 30 Minutes Intravenous Every 24 hours 08/06/21 1421 08/08/21 1104   08/04/21 1006  ceFAZolin (ANCEF) 2-4 GM/100ML-% IVPB       Note to Pharmacy: Margaretmary Dys E: cabinet override      08/04/21 1006 08/04/21 2214      Objective: Vital Signs Vitals:   10/02/21 2005 10/02/21 2307 10/03/21 0017 10/03/21 0354  BP:  124/78  (!) 148/98  Pulse: 79 82 86 90  Resp: (!) 28 (!) 21 (!) 27 (!) 22  Temp:  98.5 F (36.9 C)    TempSrc:  Oral    SpO2:  97%  97%  Weight:      Height:        Intake/Output Summary (Last 24 hours) at 10/03/2021 0735 Last data filed at 10/03/2021 0356 Gross per 24 hour  Intake --  Output 800 ml  Net -800 ml    Filed Weights   09/21/21 0500 09/22/21 0459 10/02/21 0500  Weight: 85.7 kg 85.7 kg 79.8 kg    General appearance: Noted to be awake.  No distress. Tracheostomy is noted. Resp: Diminished air entry at the bases.  No wheezing or rhonchi. Cardio: S1-S2 is normal regular.  No S3-S4.  No rubs murmurs or bruit GI: Abdomen is soft.  Nontender nondistended.  Bowel sounds are present normal.  No masses organomegaly.  PEG tube is noted    Lab Results:  Data Reviewed: I have personally reviewed following labs and reports of the imaging studies  CBC: Recent Labs   Lab 09/30/21 0150  WBC 10.0  HGB 13.6  HCT 42.1  MCV 85.1  PLT 356     Basic Metabolic Panel: Recent Labs  Lab 09/30/21 0150  NA 140  K 3.7  CL 104  CO2 26  GLUCOSE 155*  BUN 16  CREATININE 0.54*  CALCIUM 9.3  MG 1.8     GFR: Estimated Creatinine Clearance: 92 mL/min (A) (by C-G formula based on SCr of 0.54 mg/dL (L)).  Liver Function Tests: Recent Labs  Lab 09/30/21 0150  AST 39  ALT 101*  ALKPHOS 46  BILITOT 0.6  PROT 6.5  ALBUMIN 2.9*      CBG: Recent Labs  Lab 10/02/21 1149 10/02/21 1606 10/02/21 1926 10/02/21 2306 10/03/21 0354  GLUCAP 174* 96 125* 149* 175*      Radiology Studies: No results found.     LOS: 56 days   [facility] Pager on [email or web address]  10/03/2021, 7:35 AM

## 2021-10-04 LAB — GLUCOSE, CAPILLARY
Glucose-Capillary: 130 mg/dL — ABNORMAL HIGH (ref 70–99)
Glucose-Capillary: 97 mg/dL (ref 70–99)
Glucose-Capillary: 98 mg/dL (ref 70–99)
Glucose-Capillary: 165 mg/dL — ABNORMAL HIGH (ref 70–99)
Glucose-Capillary: 65 mg/dL — ABNORMAL LOW (ref 70–99)
Glucose-Capillary: 67 mg/dL — ABNORMAL LOW (ref 70–99)
Glucose-Capillary: 76 mg/dL (ref 70–99)
Glucose-Capillary: 92 mg/dL (ref 70–99)
Glucose-Capillary: 97 mg/dL (ref 70–99)
Glucose-Capillary: 151 mg/dL — ABNORMAL HIGH (ref 70–99)

## 2021-10-04 NOTE — Progress Notes (Signed)
[facility] PROGRESS NOTE   Joshua Donovan STM:[id number] DOB: [birth year] DOA: 08/03/2021  PCP: [facility] [place]  Brief History/Interval Summary: Patient with history of ICH, type II DM, HTN, HLD presented to [facility] on 7/24 with right hand numbness and weakness, initially found to have a small brainstem stroke and ventral medulla.  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  He was intubated and transferred to [facility] for interventional radiology intervention.  Remained in the intensive care unit.  Now with tracheostomy and PEG tube placement.    7/24 presented to [facility], ventral medulla CVA 7/25 tx to [facility], treated with Cleviprex. 7/26 cerebral angiogram with stent placement to right vertebro-basilar junction, failed extubation, MRI brain> mild extension of stroke, now involving bilateral medial medullary, patent basilar artery and R VBJ stent  7/28 bedside tracheostomy by PCCM and PEG tube placement by general surgery 7/30 Hypertension persist despite adding oral agents, glucose remains elevated as well 7/31 Bleeding around trach site, bright red blood.  Underwent bronchoscopy, Trach Removed, patient orally reintubated, stoma packed. 8/2 ENT took patient to OR for trach redo 8/9 transferred to medical floor on tracheostomy now. 8/12 vomiting after bolus feeding with worsening hypoxia. 8/13 copious bleeding from the heparin injection site controlled with pressure dressing. 8/14 bleeding has resolved.  Trach secretions blood-tinged. 8/15 insurance denied LTAC appeal.  CSW working on SNF. 8/16>8/22: 25 beat run of NSVT on 8/21 8/28: peer to peer for LTAC looks like this was denied- Interval x-ray showed basilar opacities--respiratory culture ordered, hypertonic saline started scopolamine discontinued 8/29: V. tach runs-increased metoprolol Resolved 9/2: Slightly increased work of breathing-low-grade temps 99 for  chest x-ray showed no interval changes 9/5:  Ancef started 2/2 secretions --no wbc or fever 9/12: Completed antibiotics - Medically stable for discharge -requires LTAC at discharge due to ongoing needs as outlined below. Continue to await safe disposition.  Consultants: Neurology.  Critical care medicine.   Subjective/Interval History: No overnight events noted.  Patient shakes his head when asked if he had any pain issues or any other complaints.    Assessment/Plan:  Acute stroke of medulla oblongata - Stent to right vertebrobasilar junction on 7/26. 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   - Continue aspirin, brilinta and lipitor - Neurology signed off on 8/4. Will need outpatient follow up. - Continue PT/OT/SLP > seeking SNF   Acute respiratory failure with hypoxia due to aspiration pneumonia/hospital-acquired MSSA pneumonia.   - CXR on 9/3 shows bibasilar atelectasis/infiltrate.  Concern about Enterobacter/MSSA HAP - Tracheostomy since 7/31, last exchanged 9/8. Followed by PCCM. - Completed ancef 9/5 - 9/12.  - Hypertonic saline nebs and chest physiotherapy per PCCM. - Scopolamine patch for secretions. - Respiratory status remains stable  Alcohol abuse, history of:   Continue thiamine, multivitamin and folic acid supplement While outside the window for withdrawals at this point.   Uncontrolled DM-2 with hyperglycemia, and hyperlipidemia A1c 8.3% on 7/25.  Patient noted to be on Levemir and SSI.  CBGs are reasonably well controlled.   Essential hypertension Reasonably well controlled on amlodipine, carvedilol, HCTZ.    Mildly abnormal LFTs Remain within normal limits, hepatitis panel negative   NSVT Stable. Coreg increased on 9/2. Cardiology saw patient last on 8/2   Oropharyngeal dysphagia status post PEG tube placement Continue tube feedings.  He is also getting free water.   Hypokalemia  Remains on scheduled potassium twice a day.  Check potassium levels periodically.   Noted to be  3.7 this morning.   Tracheostomy complication/bleeding In the setting of DAPT.  Resolved. Tracheostomy revised by ENT   Small sacral decubitus prior to admission Continue monitoring   DVT Prophylaxis: SCDs Code Status: Full code Family Communication: No family at bedside Disposition Plan: Waiting on placement.  LTAC has been denied twice by insurance.  Awaiting SNF bed availability.  Status is: Inpatient Remains inpatient appropriate because: Acute stroke with respiratory failure status post tracheostomy and PEG tube   Medications: Scheduled:  amLODipine  10 mg Per Tube Daily   aspirin  81 mg Per Tube Daily   atorvastatin  80 mg Per Tube Daily   carvedilol  37.5 mg Per Tube BID WC   diclofenac Sodium  2 g Topical QID   feeding supplement (PROSource TF20)  60 mL Per Tube Daily   fiber supplement (BANATROL TF)  60 mL Per Tube BID   folic acid  1 mg Per Tube Daily   free water  100 mL Per Tube Q4H   guaiFENesin  10 mL Per Tube Q4H   hydrochlorothiazide  25 mg Per Tube Daily   insulin aspart  0-9 Units Subcutaneous Q4H   insulin aspart  10 Units Subcutaneous Q4H   insulin detemir  30 Units Subcutaneous BID   multivitamin with minerals  1 tablet Per Tube Daily   mouth rinse  15 mL Mouth Rinse Q2H   polyethylene glycol  17 g Per Tube BID   potassium chloride  40 mEq Per Tube BID   senna-docusate  2 tablet Per Tube BID   thiamine  100 mg Per Tube Daily   ticagrelor  90 mg Per Tube BID   Continuous:  feeding supplement ([name]) 1,000 mL (10/03/21 0052)   NOM:[id number] **OR** acetaminophen (TYLENOL) oral liquid 160 mg/5 mL **OR** acetaminophen, bisacodyl, hydrALAZINE, labetalol, liver oil-zinc oxide, mouth rinse  Antibiotics: Anti-infectives (From admission, onward)    Start     Dose/Rate Route Frequency Ordered Stop   09/14/21 1445  ceFAZolin (ANCEF) IVPB 2g/100 mL premix        2 g 200 mL/hr over 30 Minutes Intravenous Every 8 hours 09/14/21  1356 09/21/21 0709   08/11/21 1400  ceFEPIme (MAXIPIME) 2 g in sodium chloride 0.9 % 100 mL IVPB        2 g 200 mL/hr over 30 Minutes Intravenous Every 8 hours 08/11/21 0957 08/16/21 0633   08/09/21 1300  cefTRIAXone (ROCEPHIN) 1 g in sodium chloride 0.9 % 100 mL IVPB  Status:  Discontinued        1 g 200 mL/hr over 30 Minutes Intravenous Every 24 hours 08/09/21 1151 08/11/21 0957   08/08/21 1200  ceFEPIme (MAXIPIME) 2 g in sodium chloride 0.9 % 100 mL IVPB  Status:  Discontinued        2 g 200 mL/hr over 30 Minutes Intravenous Every 8 hours 08/08/21 1104 08/09/21 1151   08/06/21 1500  cefTRIAXone (ROCEPHIN) 2 g in sodium chloride 0.9 % 100 mL IVPB  Status:  Discontinued        2 g 200 mL/hr over 30 Minutes Intravenous Every 24 hours 08/06/21 1421 08/08/21 1104   08/04/21 1006  ceFAZolin (ANCEF) 2-4 GM/100ML-% IVPB       Note to Pharmacy: Margaretmary Dys E: cabinet override      08/04/21 1006 08/04/21 2214      Objective: Vital Signs Vitals:   10/03/21 2345 10/04/21 0306 10/04/21 0354 10/04/21 0500  BP:  127/87  Pulse:  84    Resp:  (!) 23    Temp:  98.3 F (36.8 C)    TempSrc:  Oral    SpO2: 99% 98% 99%   Weight:    79.8 kg  Height:        Intake/Output Summary (Last 24 hours) at 10/04/2021 0741 Last data filed at 10/04/2021 0359 Gross per 24 hour  Intake 5820 ml  Output 1000 ml  Net 4820 ml    Filed Weights   09/22/21 0459 10/02/21 0500 10/04/21 0500  Weight: 85.7 kg 79.8 kg 79.8 kg    General appearance: Noted to be awake.  No distress. Tracheostomy is noted. Resp: Diminished air entry at the bases.  No wheezing or rhonchi. Cardio: S1-S2 is normal regular.  No S3-S4.  No rubs murmurs or bruit GI: Abdomen is soft.  Nontender nondistended.  Bowel sounds are present normal.  No masses organomegaly.  PEG tube is noted    Lab Results:  Data Reviewed: I have personally reviewed following labs and reports of the imaging studies  CBC: Recent Labs  Lab  09/30/21 0150  WBC 10.0  HGB 13.6  HCT 42.1  MCV 85.1  PLT 356     Basic Metabolic Panel: Recent Labs  Lab 09/30/21 0150  NA 140  K 3.7  CL 104  CO2 26  GLUCOSE 155*  BUN 16  CREATININE 0.54*  CALCIUM 9.3  MG 1.8     GFR: Estimated Creatinine Clearance: 92 mL/min (A) (by C-G formula based on SCr of 0.54 mg/dL (L)).  Liver Function Tests: Recent Labs  Lab 09/30/21 0150  AST 39  ALT 101*  ALKPHOS 46  BILITOT 0.6  PROT 6.5  ALBUMIN 2.9*      CBG: Recent Labs  Lab 10/03/21 1308 10/03/21 1627 10/03/21 1919 10/03/21 2322 10/04/21 0401  GLUCAP 150* 87 114* 183* 130*      Radiology Studies: No results found.     LOS: 29 days   [facility] Pager on [email or web address]  10/04/2021, 7:41 AM

## 2021-10-04 NOTE — Progress Notes (Signed)
NAME:  Joshua Donovan, MRN:  [id number], DOB:  11/04/1955, LOS: 39 ADMISSION DATE:  08/03/2021, CONSULTATION DATE:  7/26 REFERRING MD:  Colvin Caroli FOR CONSULT:  CVA   History of Present Illness:  66 y/o male presented to [facility] on 7/24 with R hand numbness and weakness, found to have small brainstem stroke in ventral medulla.  Symptoms worsened and required transfer to [facility] for neuro IR intervention where he had a stent placed in R vertebrovasilar junction.  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  He had a tracheostomy performed on 7/26 c/b dislodgement and ENT revision on 8/2.  Pertinent  Medical History  TBI with ICH, DM, HTN, HLD  Significant Hospital Events: Including procedures, antibiotic start and stop dates in addition to other pertinent events    7/24 presented to [facility], ventral medulla CVA 7/25 tx to [facility] 7/26 cerebral angiogram with stent placement to right vertebro-basilar junction, failed extubation, left radial aline MRI brain> mild extension of stroke, now involving bilateral medial medullary, patent basilar artery and R VBJ stent  7/28 underwent bedside percutaneous tracheostomy and PEG tube placement, tolerated well 7/29 no acute issues overnight currently on SBT trial this a.m. and tolerating well, Cleviprex resumed earlier this a.m. due to severe hypertension 7/30 Hypertension persist despite adding oral agents, glucose remains elevated as well 7/31 Bleeding around trach site, bright red blood. Copious bloody secretions. Cleviprex off, SBPs 150s-160s. Basal insulin/TF coverage increased. Cefepime deescalated to ceftriaxone. CXR stable. Later in afternoon, continued peritrach bleeding. Bronchoscopic eval completed with intratracheal bleeding associated with trach. Removed, patient orally reintubated, stoma packed. 8/1 ENT consulted for trach revision. Lightly sedated. Vent full support/PRVC, given fatigue following events of yesterday. ENT attempted to  evaluate trach at bedside, could not pass scope. Plan for OR 8/2. ASA/Brilinta held. 8/2 to OR for trach redo 8/6 trach collar during day shift, back on vent overnight 8/10 Hemoptysis, blood with suctioning > improved 8/11 8/14 on 8L / 35% ATC  8/28: Stable vitals, increased tenacious secretions from trach. CXR suggesting new LLL basilar opacities atelectasis vs pna 09/07/2021 Decreased volume and thickness of secretions. No fevers. Tracheal aspirate from 8/28 with mod gram positive cocci and few gram negative rods today, culture pending. 9/5 start cefazolin x 7 days for MSSA pneumonia 9/8 Trach exchange   Interim History / Subjective:   No complaints. On 28% trach collar Able to phonate with Passy-Muir valve  Objective   Blood pressure (!) 153/89, pulse 84, temperature 98.4 F (36.9 C), temperature source Oral, resp. rate 20, height '5\' 7"'$  (1.702 m), weight 79.8 kg, SpO2 100 %.    FiO2 (%):  [28 %] 28 %   Intake/Output Summary (Last 24 hours) at 10/04/2021 0941 Last data filed at 10/04/2021 0359 Gross per 24 hour  Intake 5820 ml  Output 1000 ml  Net 4820 ml    Filed Weights   09/22/21 0459 10/02/21 0500 10/04/21 0500  Weight: 85.7 kg 79.8 kg 79.8 kg    Examination: General: chronically ill appearing man lying in bed in NAD  on ATC HEENT: [place]/AT, eyes anicteric, No LAD, No JVD Neuro: awake, alert, can mouth words, weak all 4 extremities CV: S1S2, RRR PULM: Decreased secretions, bilateral clear breath sounds GI: soft, NT. 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 MSSA pneumonia -treated with cefazolin   Plan: Con't routine trach care Secretions seem to have decreased, can change trach per protocol Continue PMV valve and tube feeds Not a candidate for decannulation unless he makes remarkable progress with PT   Kara Mead MD. FCCP. [facility] Pager : [phone]  If no response to pager , please call [phone] until 7 pm After 7:00 pm call Elink  [phone]     10/04/21 9:41 AM

## 2021-10-04 NOTE — Progress Notes (Signed)
Pt's CBG was 67 while on tube feeding, pt asymptomatic. MD was notified and MD ordered to recheck blood sugar in an hour, insulin not given. CBG was rechecked to be 76 and now 92. Pt remains on continuous feeding. MD notified, pt remains asymptomatic. Will continue to closely monitor. Delia Heady RN

## 2021-10-04 NOTE — Progress Notes (Signed)
Physical Therapy Treatment Patient Details Name: Joshua Donovan MRN: [id number] DOB: 15-Jul-1955 Today's Date: 10/04/2021   History of Present Illness Pt is a 66 y.o. male who presented 08/02/21 with R foot pain and R-sided weakness. Transferred to [facility] 7/25. 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 S/p stent placement to the R vertebrobasilar junction 7/26, failed extubation. Trach and PEG placed 7/28. S/p bronchoscopic evaluation, oral reintubation and tracheostomy removal with stoma packing 7/31 due to trach site bleeding. S/p trach revision 8/2. PMH: DM, GERD, TBI with prior ICH, HLD, HTN    PT Comments    Patient demonstrating spasms into extension on all 4 extremities in supine.  Unable to activate to command, but talking with PMSV though weakly.  Patient limited this session due to liquid stool and breakdown on bottom, had to replace sacral dressing.  Pt remains eager to progress and continue to recommend LTACH.  PT will follow.    Recommendations for follow up therapy are one component of a multi-disciplinary discharge planning process, led by the attending physician.  Recommendations may be updated based on patient status, additional functional criteria and insurance authorization.  Follow Up Recommendations  PT at Long-term acute care hospital Can patient physically be transported by private vehicle: No   Assistance Recommended at Discharge Frequent or constant Supervision/Assistance  Patient can return home with the following Assistance with cooking/housework;Two people to help with walking and/or transfers;Two people to  help with bathing/dressing/bathroom;Assistance with feeding;Direct supervision/assist for medications management;Direct supervision/assist for financial management;Assist for transportation;Help with stairs or ramp for entrance   Equipment Recommendations  Other (comment) (TBA)    Recommendations for Other Services       Precautions / Restrictions Precautions Precautions: Fall;Other (comment) Precaution Comments: trach collar, PEG, SBP < 180, prevalon boots, copious trach secretions, watch HR     Mobility  Bed Mobility Overal bed mobility: Needs Assistance Bed Mobility: Rolling Rolling: Total assist, +2 for physical assistance         General bed mobility comments: rolling in bed to place lift pad, then noted pt with runny diarrhea so assisted for hygiene and deferred OOB    Transfers                   General transfer comment: deferred due to diarrhea    Ambulation/Gait                   Stairs             Wheelchair Mobility    Modified Rankin (Stroke Patients Only) Modified Rankin (Stroke Patients Only) Pre-Morbid Rankin Score: No symptoms Modified Rankin: Severe disability     Balance                                            Cognition Arousal/Alertness: Awake/alert Behavior During Therapy: WFL for tasks assessed/performed Overall Cognitive Status: No family/caregiver present to determine baseline cognitive functioning Area of Impairment: Following commands, Problem solving  Following Commands: Follows one step commands consistently, Follows one step commands with increased time     Problem Solving: Slow processing General Comments: using PMSV but with weak voice, slower processing, but appropriate otherwise, did not specifically test orientation        Exercises Other Exercises Other Exercises: LE PROM in supine    General Comments        Pertinent Vitals/Pain Pain  Assessment Faces Pain Scale: Hurts a little bit Pain Location: generalized grimacing with mobility Pain Descriptors / Indicators: Discomfort, Grimacing Pain Intervention(s): Monitored during session, Repositioned    Home Living                          Prior Function            PT Goals (current goals can now be found in the care plan section) Progress towards PT goals: Progressing toward goals    Frequency    Min 3X/week      PT Plan Current plan remains appropriate    Co-evaluation              AM-PAC PT "6 Clicks" Mobility   Outcome Measure  Help needed turning from your back to your side while in a flat bed without using bedrails?: Total Help needed moving from lying on your back to sitting on the side of a flat bed without using bedrails?: Total Help needed moving to and from a bed to a chair (including a wheelchair)?: Total Help needed standing up from a chair using your arms (e.g., wheelchair or bedside chair)?: Total Help needed to walk in hospital room?: Total Help needed climbing 3-5 steps with a railing? : Total 6 Click Score: 6    End of Session Equipment Utilized During Treatment: Oxygen Activity Tolerance: Other (comment) (limited due to diarrhea) Patient left: in bed   PT Visit Diagnosis: Muscle weakness (generalized) (M62.81);Other symptoms and signs involving the nervous system (R29.898);Other abnormalities of gait and mobility (R26.89);Other (comment) (quadriparesis) Hemiplegia - caused by: Cerebral infarction     Time: 9381-8299 PT Time Calculation (min) (ACUTE ONLY): 28 min  Charges:  $Therapeutic Activity: 23-37 mins                     [name], PT Acute Rehabilitation Services Office:[phone] 10/04/2021    [name] 10/04/2021, 6:15 PM

## 2021-10-05 LAB — GLUCOSE, CAPILLARY
Glucose-Capillary: 156 mg/dL — ABNORMAL HIGH (ref 70–99)
Glucose-Capillary: 102 mg/dL — ABNORMAL HIGH (ref 70–99)
Glucose-Capillary: 136 mg/dL — ABNORMAL HIGH (ref 70–99)
Glucose-Capillary: 99 mg/dL (ref 70–99)
Glucose-Capillary: 127 mg/dL — ABNORMAL HIGH (ref 70–99)
Glucose-Capillary: 103 mg/dL — ABNORMAL HIGH (ref 70–99)

## 2021-10-05 NOTE — Progress Notes (Signed)
[facility] PROGRESS NOTE   Joshua Donovan KNL:[id number] DOB: November 17, 1955 DOA: 08/03/2021  PCP: [facility] [place]  Brief History/Interval Summary: Patient with history of ICH, type II DM, HTN, HLD presented to [facility] on 7/24 with right hand numbness and weakness, initially found to have a small brainstem stroke and ventral medulla.  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  He was intubated and transferred to [facility] for interventional radiology intervention.  Remained in the intensive care unit.  Now with tracheostomy and PEG tube placement.    7/24 presented to [facility], ventral medulla CVA 7/25 tx to [facility], treated with Cleviprex. 7/26 cerebral angiogram with stent placement to right vertebro-basilar junction, failed extubation, MRI brain> mild extension of stroke, now involving bilateral medial medullary, patent basilar artery and R VBJ stent  7/28 bedside tracheostomy by PCCM and PEG tube placement by general surgery 7/30 Hypertension persist despite adding oral agents, glucose remains elevated as well 7/31 Bleeding around trach site, bright red blood.  Underwent bronchoscopy, Trach Removed, patient orally reintubated, stoma packed. 8/2 ENT took patient to OR for trach redo 8/9 transferred to medical floor on tracheostomy now. 8/12 vomiting after bolus feeding with worsening hypoxia. 8/13 copious bleeding from the heparin injection site controlled with pressure dressing. 8/14 bleeding has resolved.  Trach secretions blood-tinged. 8/15 insurance denied LTAC appeal.  CSW working on SNF. 8/16>8/22: 25 beat run of NSVT on 8/21 8/28: peer to peer for LTAC looks like this was denied- Interval x-ray showed basilar opacities--respiratory culture ordered, hypertonic saline started scopolamine discontinued 8/29: V. tach runs-increased metoprolol Resolved 9/2: Slightly increased work of breathing-low-grade temps 99 for  chest x-ray showed no interval changes 9/5:  Ancef started 2/2 secretions --no wbc or fever 9/12: Completed antibiotics - Medically stable for discharge -requires LTAC at discharge due to ongoing needs as outlined below. Continue to await safe disposition.  Consultants: Neurology.  Critical care medicine.   Subjective/Interval History: No overnight events noted.  Patient shakes his head when asked if he had any pain issues or any other complaints.    Assessment/Plan:  Acute stroke of medulla oblongata - Stent to right vertebrobasilar junction on 7/26. 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   - Continue aspirin, brilinta and lipitor - Neurology signed off on 8/4. Will need outpatient follow up. - Continue PT/OT/SLP > seeking SNF   Acute respiratory failure with hypoxia due to aspiration pneumonia/hospital-acquired MSSA pneumonia.   - CXR on 9/3 shows bibasilar atelectasis/infiltrate.  Concern about Enterobacter/MSSA HAP - Tracheostomy since 7/31, last exchanged 9/8. Followed by PCCM. - Completed ancef 9/5 - 9/12.  - Hypertonic saline nebs and chest physiotherapy per PCCM. - Scopolamine patch for secretions. - Respiratory status remains stable  Alcohol abuse, history of:   Continue thiamine, multivitamin and folic acid supplement While outside the window for withdrawals at this point.   Uncontrolled DM-2 with hyperglycemia, and hyperlipidemia A1c 8.3% on 7/25.  Patient noted to be on Levemir and SSI.  CBGs are reasonably well controlled.   Essential hypertension Reasonably well controlled on amlodipine, carvedilol, HCTZ.    Mildly abnormal LFTs Remain within normal limits, hepatitis panel negative   NSVT Stable. Coreg increased on 9/2. Cardiology saw patient last on 8/2   Oropharyngeal dysphagia status post PEG tube placement Continue tube feedings.  He is also getting free water.   Hypokalemia  Remains on scheduled potassium twice a day.  Check potassium levels periodically.   Noted to be  3.7 this morning.   Tracheostomy complication/bleeding In the setting of DAPT.  Resolved. Tracheostomy revised by ENT   Small sacral decubitus prior to admission Continue monitoring   DVT Prophylaxis: SCDs Code Status: Full code Family Communication: No family at bedside Disposition Plan: Waiting on placement.  LTAC has been denied twice by insurance.  Awaiting SNF bed availability.  Status is: Inpatient Remains inpatient appropriate because: Acute stroke with respiratory failure status post tracheostomy and PEG tube   Medications: Scheduled:  amLODipine  10 mg Per Tube Daily   aspirin  81 mg Per Tube Daily   atorvastatin  80 mg Per Tube Daily   carvedilol  37.5 mg Per Tube BID WC   diclofenac Sodium  2 g Topical QID   feeding supplement (PROSource TF20)  60 mL Per Tube Daily   fiber supplement (BANATROL TF)  60 mL Per Tube BID   folic acid  1 mg Per Tube Daily   free water  100 mL Per Tube Q4H   guaiFENesin  10 mL Per Tube Q4H   hydrochlorothiazide  25 mg Per Tube Daily   insulin aspart  0-9 Units Subcutaneous Q4H   insulin aspart  10 Units Subcutaneous Q4H   insulin detemir  30 Units Subcutaneous BID   multivitamin with minerals  1 tablet Per Tube Daily   mouth rinse  15 mL Mouth Rinse Q2H   polyethylene glycol  17 g Per Tube BID   potassium chloride  40 mEq Per Tube BID   senna-docusate  2 tablet Per Tube BID   thiamine  100 mg Per Tube Daily   ticagrelor  90 mg Per Tube BID   Continuous:  feeding supplement ([name]) 1,000 mL (10/04/21 2214)   EPP:IRJJOACZYSAYT **OR** acetaminophen (TYLENOL) oral liquid 160 mg/5 mL **OR** acetaminophen, bisacodyl, hydrALAZINE, labetalol, liver oil-zinc oxide, mouth rinse  Antibiotics: Anti-infectives (From admission, onward)    Start     Dose/Rate Route Frequency Ordered Stop   09/14/21 1445  ceFAZolin (ANCEF) IVPB 2g/100 mL premix        2 g 200 mL/hr over 30 Minutes Intravenous Every 8 hours 09/14/21  1356 09/21/21 0709   08/11/21 1400  ceFEPIme (MAXIPIME) 2 g in sodium chloride 0.9 % 100 mL IVPB        2 g 200 mL/hr over 30 Minutes Intravenous Every 8 hours 08/11/21 0957 08/16/21 0633   08/09/21 1300  cefTRIAXone (ROCEPHIN) 1 g in sodium chloride 0.9 % 100 mL IVPB  Status:  Discontinued        1 g 200 mL/hr over 30 Minutes Intravenous Every 24 hours 08/09/21 1151 08/11/21 0957   08/08/21 1200  ceFEPIme (MAXIPIME) 2 g in sodium chloride 0.9 % 100 mL IVPB  Status:  Discontinued        2 g 200 mL/hr over 30 Minutes Intravenous Every 8 hours 08/08/21 1104 08/09/21 1151   08/06/21 1500  cefTRIAXone (ROCEPHIN) 2 g in sodium chloride 0.9 % 100 mL IVPB  Status:  Discontinued        2 g 200 mL/hr over 30 Minutes Intravenous Every 24 hours 08/06/21 1421 08/08/21 1104   08/04/21 1006  ceFAZolin (ANCEF) 2-4 GM/100ML-% IVPB       Note to Pharmacy: Margaretmary Dys E: cabinet override      08/04/21 1006 08/04/21 2214      Objective: Vital Signs Vitals:   10/04/21 2309 10/05/21 0014 10/05/21 0320 10/05/21 0427  BP: (!) 142/85 (!) 156/94 131/88  131/88  Pulse: 87 86 85 84  Resp: (!) 37 (!) 25 (!) 28 (!) 25  Temp: 98.1 F (36.7 C)  98.3 F (36.8 C)   TempSrc: Oral  Oral   SpO2: 95% 93% 94% 96%  Weight:      Height:        Intake/Output Summary (Last 24 hours) at 10/05/2021 0745 Last data filed at 10/04/2021 2316 Gross per 24 hour  Intake 1320 ml  Output 1000 ml  Net 320 ml    Filed Weights   09/22/21 0459 10/02/21 0500 10/04/21 0500  Weight: 85.7 kg 79.8 kg 79.8 kg    General appearance: Noted to be awake.  No distress. Tracheostomy is noted. Resp: Diminished air entry at the bases.  No wheezing or rhonchi. Cardio: S1-S2 is normal regular.  No S3-S4.  No rubs murmurs or bruit GI: Abdomen is soft.  Nontender nondistended.  Bowel sounds are present normal.  No masses organomegaly.  PEG tube is noted    Lab Results:  Data Reviewed: I have personally reviewed following labs and  reports of the imaging studies  CBC: Recent Labs  Lab 09/30/21 0150  WBC 10.0  HGB 13.6  HCT 42.1  MCV 85.1  PLT 356     Basic Metabolic Panel: Recent Labs  Lab 09/30/21 0150  NA 140  K 3.7  CL 104  CO2 26  GLUCOSE 155*  BUN 16  CREATININE 0.54*  CALCIUM 9.3  MG 1.8     GFR: Estimated Creatinine Clearance: 92 mL/min (A) (by C-G formula based on SCr of 0.54 mg/dL (L)).  Liver Function Tests: Recent Labs  Lab 09/30/21 0150  AST 39  ALT 101*  ALKPHOS 46  BILITOT 0.6  PROT 6.5  ALBUMIN 2.9*      CBG: Recent Labs  Lab 10/04/21 1747 10/04/21 1844 10/04/21 1913 10/04/21 2314 10/05/21 0325  GLUCAP 76 92 97 151* 156*      Radiology Studies: No results found.     LOS: 17 days   [facility] Pager on [email or web address]  10/05/2021, 7:45 AM

## 2021-10-05 NOTE — Progress Notes (Signed)
Speech Language Pathology Treatment: Dysphagia;Passy Muir Speaking valve  Patient Details Name: Joshua Donovan MRN: [id number] DOB: 1955/04/20 Today's Date: 10/05/2021 Time: 1000-1030 SLP Time Calculation (min) (ACUTE ONLY): 30 min  Assessment / Plan / Recommendation Clinical Impression  Pt demonstrates improving phonation and communication ability. Provided cues for pt to chunk words with a breath for increased volume. Noted that pt wearing abdominal binder which is helpful for breath support. Pt also seemed to be belching and swallowing, he did say he felt like he throws up sometimes. Likely refluxing. Positioned pt more upright. Provided trials of 1/4 teaspoon of ice cream x3. Pt orally manipulated and swallowed. Very delayed coughing with brown tinged mucus noted suggested some degree of aspiration. Will proceed with FEES tomorrow at 1 for instrumental assessment.   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 He  presented 08/02/21 with R foot pain and R-sided weakness. Transferred to [facility] 7/25. 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 S/p stent placement to the R vertebrobasilar junction 7/26, failed extubation. Trach and PEG placed 7/28. PMH: DM, GERD, TBI with prior ICH, HLD, HTN      SLP Plan  Continue with current plan of care;Other (Comment) (FEES)      Recommendations for follow up therapy are one component of a multi-disciplinary discharge planning process, led by the attending physician.  Recommendations may be updated based on patient status, additional functional criteria and insurance authorization.     Recommendations         Patient may use Passy-Muir Speech Valve: During all waking hours (remove during sleep) PMSV Supervision: Intermittent MD: Please consider changing trach tube to : Smaller size         Plan: Continue with current plan of care;Other (Comment) (FEES)           Love Chowning, [name]  10/05/2021, 10:43 AM

## 2021-10-06 LAB — GLUCOSE, CAPILLARY
Glucose-Capillary: 83 mg/dL (ref 70–99)
Glucose-Capillary: 147 mg/dL — ABNORMAL HIGH (ref 70–99)
Glucose-Capillary: 126 mg/dL — ABNORMAL HIGH (ref 70–99)
Glucose-Capillary: 154 mg/dL — ABNORMAL HIGH (ref 70–99)
Glucose-Capillary: 175 mg/dL — ABNORMAL HIGH (ref 70–99)
Glucose-Capillary: 150 mg/dL — ABNORMAL HIGH (ref 70–99)

## 2021-10-06 NOTE — Progress Notes (Signed)
Physical Therapy Treatment Patient Details Name: Joshua Donovan MRN: [id number] DOB: [birth year] Today's Date: 10/06/2021   History of Present Illness Pt is a 66 y.o. male who presented 08/02/21 with R foot pain and R-sided weakness. Transferred to [facility] 7/25. 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 S/p stent placement to the R vertebrobasilar junction 7/26, failed extubation. Trach and PEG placed 7/28. S/p bronchoscopic evaluation, oral reintubation and tracheostomy removal with stoma packing 7/31 due to trach site bleeding. S/p trach revision 8/2. PMH: DM, GERD, TBI with prior ICH, HLD, HTN    PT Comments    Patient able to demonstrate some isolated head control apart from extensor pattern, but slow and difficulty to maintain.  Patient vocalizing some with PMSV, but weak and only uses when cued.  Patient with open areas on buttocks but cleaned and applied Mepilex pads prior to transition to chair.  Patient remains appropriate for LTACH at d/c.   Recommendations for follow up therapy are one component of a multi-disciplinary discharge planning process, led by the attending physician.  Recommendations may be updated based on patient status, additional functional criteria and insurance authorization.  Follow Up Recommendations  PT at Long-term acute care hospital Can patient physically be transported by private vehicle: No   Assistance Recommended at Discharge Frequent or constant Supervision/Assistance  Patient can return home with the following Assistance with cooking/housework;Two people to help with walking and/or transfers;Two  people to help with bathing/dressing/bathroom;Assistance with feeding;Direct supervision/assist for medications management;Direct supervision/assist for financial management;Assist for transportation;Help with stairs or ramp for entrance   Equipment Recommendations  Other (comment) (TBA)    Recommendations for Other Services       Precautions / Restrictions Precautions Precautions: Fall Precaution Comments: trach collar, PEG, SBP < 180, abdominal binder, copious trach secretions, watch HR Restrictions Weight Bearing Restrictions: No     Mobility  Bed Mobility Overal bed mobility: Needs Assistance Bed Mobility: Rolling, Sidelying to Sit Rolling: Total assist, +2 for physical assistance Sidelying to sit: Total assist, +2 for physical assistance       General bed mobility comments: Total assist +2 for all aspects of bed mobility.    Transfers Overall transfer level: Needs assistance Equipment used: None Transfers: Bed to chair/wheelchair/BSC       Squat pivot transfers: Total assist, +2 safety/equipment          Ambulation/Gait                   Stairs             Wheelchair Mobility    Modified Rankin (Stroke Patients Only) Modified Rankin (Stroke Patients Only) Pre-Morbid Rankin Score: No symptoms Modified Rankin: Severe disability     Balance Overall balance assessment: Needs assistance Sitting-balance support: Feet supported Sitting balance-Leahy Scale: Zero Sitting balance - Comments: total assist needed for sitting EOB; working on head control with pt able to rotate few degrees to L and R with increased time, also able to right his head without using full extension  Cognition Arousal/Alertness: Awake/alert Behavior During Therapy: WFL for tasks assessed/performed Overall Cognitive Status: Impaired/Different from baseline Area of Impairment: Following commands                        Following Commands: Follows one step commands consistently, Follows one step commands with increased time       General Comments: Pt with use of PMSV during session but still with soft voice and difficulty at times to understand.  Attempts initiation to movement with commands but limited secondary to motor impariment.        Exercises General Exercises - Lower Extremity Ankle Circles/Pumps: PROM, 10 reps, Supine, Both Heel Slides: PROM, Both, 10 reps, Supine    General Comments        Pertinent Vitals/Pain Pain Assessment Pain Assessment: Faces Faces Pain Scale: Hurts little more Pain Location: grimacing with ROM activities Pain Descriptors / Indicators: Grimacing Pain Intervention(s): Monitored during session, Repositioned    Home Living                          Prior Function            PT Goals (current goals can now be found in the care plan section) Progress towards PT goals: Progressing toward goals    Frequency    Min 3X/week      PT Plan Current plan remains appropriate    Co-evaluation PT/OT/SLP Co-Evaluation/Treatment: Yes Reason for Co-Treatment: Complexity of the patient's impairments (multi-system involvement);To address functional/ADL transfers PT goals addressed during session: Mobility/safety with mobility;Balance OT goals addressed during session: ADL's and self-care      AM-PAC PT "6 Clicks" Mobility   Outcome Measure  Help needed turning from your back to your side while in a flat bed without using bedrails?: Total Help needed moving from lying on your back to sitting on the side of a flat bed without using bedrails?: Total Help needed moving to and from a bed to a chair (including a wheelchair)?: Total Help needed standing up from a chair using your arms (e.g., wheelchair or bedside chair)?: Total Help needed to walk in hospital room?: Total Help needed climbing 3-5 steps with a railing? : Total 6 Click Score: 6     End of Session Equipment Utilized During Treatment: Oxygen;Gait belt Activity Tolerance: Patient tolerated treatment well Patient left: in chair;with call bell/phone within reach;with chair alarm set Nurse Communication: Mobility status PT Visit Diagnosis: Muscle weakness (generalized) (M62.81);Other symptoms and signs involving the nervous system (R29.898);Other abnormalities of gait and mobility (R26.89);Other (comment) (quadriparesis) Hemiplegia - Right/Left: Right Hemiplegia - caused by: Cerebral infarction     Time: 1128-1226 PT Time Calculation (min) (ACUTE ONLY): 58 min  Charges:  $Therapeutic Activity: 23-37 mins                     [name], PT Acute Rehabilitation Services [phone] 10/06/2021    Joshua Donovan 10/06/2021, 2:48 PM

## 2021-10-06 NOTE — Procedures (Signed)
Objective Swallowing Evaluation: Type of Study: FEES-Fiberoptic Endoscopic Evaluation of Swallow   Patient Details  Name: Joshua Donovan MRN: [id number] Date of Birth: May 10, 1955  Today's Date: 10/06/2021 Time: SLP Start Time (ACUTE ONLY): 38 -SLP Stop Time (ACUTE ONLY): 1400  SLP Time Calculation (min) (ACUTE ONLY): 25 min   Past Medical History:  Past Medical History:  Diagnosis Date   Diabetes mellitus without complication ([place])    GERD (gastroesophageal reflux disease)    Hemorrhage in the brain ([place]) 08/2015   High cholesterol    Hyperlipidemia    Hypertension    Hypertensive emergency 08/03/2021   Past Surgical History:  Past Surgical History:  Procedure Laterality Date   COLONOSCOPY WITH PROPOFOL N/A 09/08/2017   Procedure: COLONOSCOPY WITH PROPOFOL;  Surgeon: Lucilla Lame, MD;  Location: [place];  Service: Endoscopy;  Laterality: N/A;  Diabetic - oral meds   IR ANGIO INTRA EXTRACRAN SEL COM CAROTID INNOMINATE BILAT MOD SED  08/04/2021   IR ANGIO VERTEBRAL SEL SUBCLAVIAN INNOMINATE UNI L MOD SED  08/04/2021   IR ANGIO VERTEBRAL SEL VERTEBRAL UNI R MOD SED  08/04/2021   IR CT HEAD LTD  08/04/2021   IR INTRA CRAN STENT  08/04/2021   IR US GUIDE VASC ACCESS RIGHT  08/04/2021   partial intestine removed     approx date: 1980   POLYPECTOMY  09/08/2017   Procedure: POLYPECTOMY;  Surgeon: Lucilla Lame, MD;  Location: [place];  Service: Endoscopy;;   RADIOLOGY WITH ANESTHESIA N/A 08/04/2021   Procedure: Angioplasty/stenting of vertebrobasilar stenosis;  Surgeon: Luanne Bras, MD;  Location: [place];  Service: Radiology;  Laterality: N/A;   TRACHEOSTOMY TUBE PLACEMENT N/A 08/11/2021   Procedure: TRACHEOSTOMY;  Surgeon: Skotnicki, Meghan A, DO;  Location: [facility];  Service: ENT;  Laterality: N/A;   HPI: 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 He  presented 08/02/21 with R foot pain and R-sided weakness. Transferred to [facility] 7/25. MRI 7/26 revealed interval  expansion of previously identified ventral medullary infarct, extending posteriorly to traverse the medulla to the floor of the fourth ventricle, new scattered  small volume ischemic infarcts involving the right cerebellum as well as the cortical aspects of the right greater than left occipital lobes, and single punctate focus of associated petechial hemorrhage at the right cerebellum. S/p stent placement to the R vertebrobasilar junction 7/26, failed extubation. Trach and PEG placed 7/28. PMH: DM, GERD, TBI with prior ICH, HLD, HTN   Subjective: alert    Recommendations for follow up therapy are one component of a multi-disciplinary discharge planning process, led by the attending physician.  Recommendations may be updated based on patient status, additional functional criteria and insurance authorization.  Assessment / Plan / Recommendation     10/06/2021    2:00 PM  Clinical Impressions  Clinical Impression Pt demonstrates severe oropharyngeal dysphagia. PMSV in place and tolerated well during throughout. Non swallowing motor tasks show good base of tongue tension and movement, good right arytenoid movement and decreased left arytenoid movement though pt can achieve at least partial adduction for phonation. Left lateral pharyngeal wall appears slightly weak as well. Pt can initiate a dry swallow, but it does not clear secretions. Oral phase relatively good; pt able to masticate and propel ice chip and puree boluses. However when swallow initiated there is almost no laryngeal elevation and minimal UES opening. Pt aspirates most of ice chip and puree bolus. He does cough and reswallow many times but aspirate just  moves in and out of glottis. Cough was too weak to fully eject until SLP applied suction to base of tongue to trigger a strong involuntary cough. Pt recommended to remain NPO at this time. Will start IMST and EMST for increased respiratory support and glottic closure.  SLP Visit Diagnosis  Dysphagia, oropharyngeal phase (R13.12)  Impact on safety and function Severe aspiration risk         10/06/2021    2:00 PM  Treatment Recommendations  Treatment Recommendations Therapy as outlined in treatment plan below         No data to display             10/06/2021    2:00 PM  Diet Recommendations  SLP Diet Recommendations NPO;Alternative means - long-term  Medication Administration Via alternative means         10/06/2021    2:00 PM  Other Recommendations  Follow Up Recommendations Skilled nursing-short term rehab (<3 hours/day)  Assistance recommended at discharge Frequent or constant Supervision/Assistance  Functional Status Assessment Patient has had a recent decline in their functional status and/or demonstrates limited ability to make significant improvements in function in a reasonable and predictable amount of time       10/06/2021    2:00 PM  Frequency and Duration   Speech Therapy Frequency (ACUTE ONLY) min 2x/week  Treatment Duration 2 weeks         10/06/2021    2:00 PM  Oral Phase  Oral Phase [facility]       10/06/2021    2:00 PM  Pharyngeal Phase  Pharyngeal Phase Impaired  Pharyngeal- Puree Reduced laryngeal elevation;Penetration/Aspiration during swallow;Penetration/Apiration after swallow;Significant aspiration (Amount);Inter-arytenoid space residue;Lateral channel residue  Pharyngeal Material enters airway, passes BELOW cords then ejected out;Material enters airway, passes BELOW cords and not ejected out despite cough attempt by patient        10/06/2021    2:00 PM  Cervical Esophageal Phase   Cervical Esophageal Phase Impaired  Puree Reduced cricopharyngeal relaxation     Merikay Lesniewski, [name] 10/06/2021, 2:38 PM

## 2021-10-06 NOTE — Progress Notes (Signed)
Joshua Donovan  FOY:[id number] DOB: [birth year] DOA: 08/03/2021 PCP: [facility] [place]    Brief Narrative:  66 year old with a history of ICH, DM 2, HTN, and HLD who presented to [facility] 7/24 with right hand numbness and weakness and was diagnosed with a small brainstem stroke.  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  He required intubation and was transferred to [facility] for interventional radiology intervention.  He ultimately required tracheostomy and PEG tube placement, and has subsequently suffered a prolonged hospital stay.  Significant events:  7/24 presented to [facility], ventral medulla CVA 7/25 tx to [facility], treated with Cleviprex 7/26 cerebral angiogram with stent placement to right vertebro-basilar junction, failed extubation, MRI brain> mild extension of stroke, now involving bilateral medial medullary, patent basilar artery and R VBJ stent  7/28 bedside tracheostomy by PCCM and PEG tube placement by General Surgery 7/31 Bleeding around trach site, bright red blood.  Underwent bronchoscopy, Trach Removed, patient orally reintubated, stoma packed. 8/2 ENT took patient to OR for trach redo 8/9 transferred to medical floor on tracheostomy 8/12 vomiting after bolus feeding with worsening hypoxia 8/13 copious bleeding from the heparin injection site controlled with pressure dressing 8/14 bleeding has resolved.  Trach secretions blood-tinged. 8/15 insurance denied LTAC appeal.  CSW working on SNF. 8/28: peer to peer for LTAC looks like this was denied 8/29: V. tach runs - increased metoprolol  9/2: Slightly increased work of [place] 99 for chest x-ray showed no interval changes 9/5:  Ancef started 2/2 secretions --no wbc or fever 9/12 completed abx course    Consultants:  Neurology Interventional Radiology PCCM Cardiology  Goals of Care:  Code Status: Full Code   DVT prophylaxis: SCDs  Interim Hx: Alert and  pleasant at the time of my visit.  Working with therapy.  No new complaints.  Denies chest pain shortness of breath or abdominal pain.  Assessment & Plan:  Acute stroke of medulla oblongata Status post stent to right vertebrobasilar junction 7/26 -persisting quadriplegia -continue aspirin Brilinta and Lipitor per neurology - Neurology signed off 8/4 -will need outpatient neurology follow-up -continue PT/OT/SLP -seeking SNF placement as insurance denied LTAC  Aspiration pneumonia -hospital-acquired MSSA pneumonia  CXR 9/3 confirmed bibasilar infiltrates -treated with Ancef 9/5 > 9/12  Tracheostomy dependence Tracheostomy originally placed 7/67 -this was complicated with significant bleeding requiring reintubation and discontinuation of trach -ENT revised tracheostomy 8/2  History of alcohol abuse Continue empiric thiamine, multivitamin, and folic acid  Uncontrolled DM2 with hyperglycemia A1c 8.3 during this hospital stay  HTN Continue amlodipine, Coreg, HCTZ -blood pressure reasonably controlled  Mildly elevated LFTs Now resolved -viral hepatitis panel negative  NSVT Responded well to Coreg -cardiology has evaluated  Oropharyngeal dysphagia -PEG tube dependent Due to above -continue tube feeds and free water via tube  Hypokalemia Corrected with supplementation  Small sacral decubitus POA Continue wound care/RN directed monitoring  Family Communication: No family present at time of exam Disposition: Awaiting SNF placement   Objective: Blood pressure (!) 149/88, pulse 91, temperature 98.6 F (37 C), temperature source Oral, resp. rate (!) 28, height '5\' 7"'$  (1.702 m), weight 79.8 kg, SpO2 97 %.  Intake/Output Summary (Last 24 hours) at 10/06/2021 0920 Last data filed at 10/05/2021 1203 Gross per 24 hour  Intake --  Output 500 ml  Net -500 ml   Filed Weights   09/22/21 0459 10/02/21 0500 10/04/21 0500  Weight: 85.7 kg 79.8 kg 79.8 kg  Examination: General: No acute  respiratory distress Lungs: Clear to auscultation bilaterally without wheezes or crackles Cardiovascular: Regular rate and rhythm without murmur gallop or rub normal S1 and S2 Abdomen: Nontender, nondistended, soft, bowel sounds positive, no rebound, PEG tube insertion site clean and dry Extremities: No significant cyanosis, clubbing, or edema bilateral lower extremities  CBC: Recent Labs  Lab 09/30/21 0150  WBC 10.0  HGB 13.6  HCT 42.1  MCV 85.1  PLT 003   Basic Metabolic Panel: Recent Labs  Lab 09/30/21 0150  NA 140  K 3.7  CL 104  CO2 26  GLUCOSE 155*  BUN 16  CREATININE 0.54*  CALCIUM 9.3  MG 1.8   GFR: Estimated Creatinine Clearance: 92 mL/min (A) (by C-G formula based on SCr of 0.54 mg/dL (L)).   Scheduled Meds:  amLODipine  10 mg Per Tube Daily   aspirin  81 mg Per Tube Daily   atorvastatin  80 mg Per Tube Daily   carvedilol  37.5 mg Per Tube BID WC   diclofenac Sodium  2 g Topical QID   feeding supplement (PROSource TF20)  60 mL Per Tube Daily   fiber supplement (BANATROL TF)  60 mL Per Tube BID   folic acid  1 mg Per Tube Daily   free water  100 mL Per Tube Q4H   guaiFENesin  10 mL Per Tube Q4H   hydrochlorothiazide  25 mg Per Tube Daily   insulin aspart  0-9 Units Subcutaneous Q4H   insulin aspart  10 Units Subcutaneous Q4H   insulin detemir  30 Units Subcutaneous BID   multivitamin with minerals  1 tablet Per Tube Daily   mouth rinse  15 mL Mouth Rinse Q2H   polyethylene glycol  17 g Per Tube BID   potassium chloride  40 mEq Per Tube BID   senna-docusate  2 tablet Per Tube BID   thiamine  100 mg Per Tube Daily   ticagrelor  90 mg Per Tube BID   Continuous Infusions:  feeding supplement ([name]) 1,000 mL (10/05/21 1953)     LOS: 6 days   [name], MD [facility] Office  [phone] Pager - Text Page per Amion  If 7PM-7AM, please contact night-coverage per Amion 10/06/2021, 9:20 AM

## 2021-10-06 NOTE — Progress Notes (Addendum)
Occupational Therapy Treatment Patient Details Name: Joshua Donovan MRN: [id number] DOB: Nov 08, 1955 Today's Date: 10/06/2021   History of present illness Pt is a 66 y.o. male who presented 08/02/21 with R foot pain and R-sided weakness. Transferred to [facility] 7/25. 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 S/p stent placement to the R vertebrobasilar junction 7/26, failed extubation. Trach and PEG placed 7/28. S/p bronchoscopic evaluation, oral reintubation and tracheostomy removal with stoma packing 7/31 due to trach site bleeding. S/p trach revision 8/2. PMH: DM, GERD, TBI with prior ICH, HLD, HTN   OT comments  Pt currently still total assist +2 for all bed mobility, supine to sit, sitting balance, and transfers squat pivot to the tilt in space wheelchair.  Pt still with decreased overall active movement but is making slight improvements with cervical extension and rotation to both sides.  Increased synergy patterns noted with helping pt initiate cervical extension.  Recommend continued acute care OT with transition to [facility] for further rehab.     Recommendations for follow up therapy are one component of a multi-disciplinary discharge planning process, led by the attending physician.  Recommendations may be updated based on patient status, additional functional criteria and insurance authorization.    Follow Up Recommendations  OT at Long-term acute care hospital    Assistance Recommended at Discharge Frequent or constant Supervision/Assistance  Patient can return home with the following  Two people to help with walking and/or  transfers;Two people to help with bathing/dressing/bathroom;Assistance with cooking/housework;Assistance with feeding;Direct supervision/assist for medications management;Direct supervision/assist for financial management;Assist for transportation;Help with stairs or ramp for entrance   Equipment Recommendations  Other (comment) (TBD next venue of care)       Precautions / Restrictions Precautions Precautions: Fall;Other (comment) Precaution Comments: trach collar, PEG, SBP < 180, abdominal binder, copious trach secretions, watch HR Restrictions Weight Bearing Restrictions: No       Mobility Bed Mobility Overal bed mobility: Needs Assistance Bed Mobility: Rolling, Sidelying to Sit Rolling: Total assist, +2 for physical assistance Sidelying to sit: Total assist, +2 for physical assistance       General bed mobility comments: Total assist +2 for all aspects of bed mobility.    Transfers Overall transfer level: Needs assistance Equipment used: None Transfers: Bed to chair/wheelchair/BSC     Squat pivot transfers: Total assist, +2 safety/equipment             Balance Overall balance assessment: Needs assistance Sitting-balance support: No upper extremity supported, Feet supported, Bilateral upper extremity supported Sitting balance-Leahy Scale: Zero Sitting balance - Comments: total assist needed for sitting EOB                                   ADL either performed or assessed with clinical judgement   ADL Overall ADL's : Needs assistance/impaired                 Upper Body Dressing : Total assistance Upper Body Dressing Details (indicate cue type and reason): abdominal binder Lower Body Dressing: Total assistance Lower Body Dressing Details (indicate cue type and reason): gripper socks  Toileting- Clothing Manipulation and Hygiene: Total assistance;+2 for physical assistance;Bed level       Functional mobility during ADLs: Total  assistance;+2 for physical assistance (supine to sit and for squat pivot to the wheelchair) General ADL Comments: Completed PROM shoulder flexion bilaterally for one set of 10 reps to begin session with work on rolling side to side in bed to clean up bowel incontinence and place dressings over small buttocks wounds.  Pt worked in sitting for over 10 mins on head control for movements of cervical extension and slight rotation.  Pt still severly limited but can extend head to neutral and maintain with min facilitation for intervals of 10-20 seconds.  Slight rotations actively noted to the left and right as well, less than 25 degrees each direction.  Extensor synergy noted at times with AAROM extension of the head resulting in BUE extension and BLE extension patterns (mostly LLE).  Squat pivot transfer over to the tilt in space wheelchair to finish session with positioning in slight tilt with pillows and bed pads provided for positioning of LEs, UEs, head and trunk.  PMV in place throughout session with encouragement to talk.  Soft whispers noted mostly with increased coughing and secretions in sitting.            Cognition Arousal/Alertness: Awake/alert Behavior During Therapy: WFL for tasks assessed/performed Overall Cognitive Status: No family/caregiver present to determine baseline cognitive functioning Area of Impairment: Following commands                 Orientation Level: Person, Place, Time     Following Commands: Follows one step commands consistently, Follows one step commands with increased time       General Comments: Pt with use of PMSV during session but still with soft voice and difficulty at times to understand.  Attempts initiation to movement with commands but limited secondary to motor impariment.                   Pertinent Vitals/ Pain       Pain Assessment Pain Assessment: Faces Pain Score: 0-No pain         Frequency  Min 2X/week        Progress  Toward Goals  OT Goals(current goals can now be found in the care plan section)  Progress towards OT goals: Goals drowngraded-see care plan  Acute Rehab OT Goals Patient Stated Goal: Pt did not state but agreeable to working in therapy. OT Goal Formulation: With patient Time For Goal Achievement: 10/20/21 Potential to Achieve Goals: Good  Plan Discharge plan remains appropriate    Co-evaluation    PT/OT/SLP Co-Evaluation/Treatment: Yes Reason for Co-Treatment: Complexity of the patient's impairments (multi-system involvement)   OT goals addressed during session: ADL's and self-care      AM-PAC OT "6 Clicks" Daily Activity     Outcome Measure   Help from another person eating meals?: Total Help from another person taking care of personal grooming?: Total Help from another person toileting, which includes using toliet, bedpan, or urinal?: Total Help from another person bathing (including washing, rinsing, drying)?: Total Help from another person to put on and taking off regular upper body clothing?: Total Help from another person to put on and taking off regular lower body clothing?: Total 6 Click Score: 6    End of Session Equipment Utilized During Treatment: Oxygen;Other (comment) (abdominal binder)  OT Visit Diagnosis: Other symptoms and signs involving the nervous system (R29.898);Muscle weakness (generalized) (M62.81);Feeding difficulties (  R63.3)   Activity Tolerance Patient tolerated treatment well   Patient Left with call bell/phone within reach;in chair   Nurse Communication Mobility status        Time: 9444-6190 OT Time Calculation (min): 58 min  Charges: OT General Charges $OT Visit: 1 Visit OT Treatments $Self Care/Home Management : 23-37 mins  Ishanvi Mcquitty OTR/L 10/06/2021, 2:20 PM

## 2021-10-07 LAB — BASIC METABOLIC PANEL
Anion gap: 12 (ref 5–15)
BUN: 13 mg/dL (ref 8–23)
CO2: 25 mmol/L (ref 22–32)
Calcium: 9.4 mg/dL (ref 8.9–10.3)
Chloride: 99 mmol/L (ref 98–111)
Creatinine, Ser: 0.5 mg/dL — ABNORMAL LOW (ref 0.61–1.24)
GFR, Estimated: >60 mL/min (ref >60)
Glucose, Bld: 138 mg/dL — ABNORMAL HIGH (ref 70–99)
Potassium: 3.7 mmol/L (ref 3.5–5.1)
Sodium: 136 mmol/L (ref 135–145)

## 2021-10-07 LAB — GLUCOSE, CAPILLARY
Glucose-Capillary: 137 mg/dL — ABNORMAL HIGH (ref 70–99)
Glucose-Capillary: 123 mg/dL — ABNORMAL HIGH (ref 70–99)
Glucose-Capillary: 110 mg/dL — ABNORMAL HIGH (ref 70–99)
Glucose-Capillary: 155 mg/dL — ABNORMAL HIGH (ref 70–99)
Glucose-Capillary: 141 mg/dL — ABNORMAL HIGH (ref 70–99)
Glucose-Capillary: 99 mg/dL (ref 70–99)

## 2021-10-07 LAB — CBC
HCT: 40.6 % (ref 39.0–52.0)
Hemoglobin: 13.2 g/dL (ref 13.0–17.0)
MCH: 27.5 pg (ref 26.0–34.0)
MCHC: 32.5 g/dL (ref 30.0–36.0)
MCV: 84.6 fL (ref 80.0–100.0)
Platelets: 323 10*3/uL (ref 150–400)
RBC: 4.8 MIL/uL (ref 4.22–5.81)
RDW: 14 % (ref 11.5–15.5)
WBC: 7.4 10*3/uL (ref 4.0–10.5)
nRBC: 0 % (ref 0.0–0.2)

## 2021-10-07 LAB — PHOSPHORUS: Phosphorus: 4.3 mg/dL (ref 2.5–4.6)

## 2021-10-07 LAB — MAGNESIUM: Magnesium: 1.6 mg/dL — ABNORMAL LOW (ref 1.7–2.4)

## 2021-10-07 MED ORDER — MAGNESIUM SULFATE 2 GM/50ML IV SOLN
2.0000 g | Freq: Once | INTRAVENOUS | Status: AC
  Administered 2021-10-07: 2 g via INTRAVENOUS
  Filled 2021-10-07: qty 50

## 2021-10-07 NOTE — Progress Notes (Signed)
Joshua Donovan  ZSW:[id number] DOB: [birth year] DOA: 08/03/2021 PCP: [facility] [place]    Brief Narrative:  66 year old with a history of ICH, DM 2, HTN, and HLD who presented to [facility] 7/24 with right hand numbness and weakness and was diagnosed with a small brainstem stroke.  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  He required intubation and was transferred to [facility] for an interventional radiology intervention.  He ultimately required tracheostomy and PEG tube placement, and has subsequently suffered a prolonged hospital stay.  Significant events:  7/24 presented to [facility], ventral medulla CVA 7/25 tx to [facility], treated with Cleviprex 7/26 cerebral angiogram with stent placement to right vertebro-basilar junction, failed extubation, MRI brain> mild extension of stroke, now involving bilateral medial medullary, patent basilar artery and R VBJ stent  7/28 bedside tracheostomy by PCCM and PEG tube placement by General Surgery 7/31 Bleeding around trach site, bright red blood.  Underwent bronchoscopy, Trach Removed, patient orally reintubated, stoma packed. 8/2 ENT took patient to OR for trach redo 8/9 transferred to medical floor on tracheostomy 8/12 vomiting after bolus feeding with worsening hypoxia 8/13 copious bleeding from the heparin injection site controlled with pressure dressing 8/14 bleeding has resolved.  Trach secretions blood-tinged. 8/15 insurance denied LTAC appeal.  CSW working on SNF. 8/28: peer to peer for LTAC looks like this was denied 8/29: V. tach runs - increased metoprolol  9/2: Slightly increased work of [place] 99 for chest x-ray showed no interval changes 9/5:  Ancef started 2/2 secretions --no wbc or fever 9/12 completed abx course    Consultants:  Neurology Interventional Radiology PCCM Cardiology  Goals of Care:  Code Status: Full Code   DVT prophylaxis: SCDs  Interim Hx: No  acute events reported overnight.  Patient underwent FEES eval yesterday resulting in persisting recommendation for n.p.o. status only.  Afebrile.  Vital signs stable.  Resting comfortably in bed.  Assessment & Plan:  Acute stroke of medulla oblongata Status post stent to right vertebrobasilar junction 7/26 -persisting quadriplegia -continue aspirin Brilinta and Lipitor per Neurology - Neurology signed off 8/4 -will need outpatient Neurology follow-up -continue PT/OT/SLP -seeking SNF placement as insurance denied LTAC  Aspiration pneumonia -hospital-acquired MSSA pneumonia  CXR 9/3 confirmed bibasilar infiltrates -treated with Ancef 9/5 > 9/12 -no clinical evidence of active infection at present  Tracheostomy dependence Tracheostomy originally placed 3/22 -this was complicated by significant bleeding requiring reintubation and discontinuation of trach - ENT revised tracheostomy 8/2  History of alcohol abuse Continue empiric thiamine, multivitamin, and folic acid  Uncontrolled DM2 with hyperglycemia A1c 8.3 during this hospital stay -CBG well controlled  HTN Continue amlodipine, Coreg, HCTZ -blood pressure controlled  Mildly elevated LFTs Now resolved -viral hepatitis panel negative  NSVT Responded well to [place] - Cardiology has evaluated  Oropharyngeal dysphagia -PEG tube dependent Due to above -continue tube feeds and free water via tube  Hypokalemia Corrected with supplementation  Hypomagnesemia Supplement and follow  Small sacral decubitus POA Continue wound care/RN directed monitoring  Family Communication: No family present at time of exam Disposition: Awaiting SNF placement   Objective: Blood pressure (!) 152/95, pulse 92, temperature 98.1 F (36.7 C), temperature source Oral, resp. rate (!) 25, height '5\' 7"'$  (1.702 m), weight 79.8 kg, SpO2 94 %.  Intake/Output Summary (Last 24 hours) at 10/07/2021 0829 Last data filed at 10/07/2021 0715 Gross per 24 hour  Intake  --  Output 1400 ml  Net -1400 ml  Filed Weights   09/22/21 0459 10/02/21 0500 10/04/21 0500  Weight: 85.7 kg 79.8 kg 79.8 kg    Examination: General: No acute respiratory distress Lungs: Clear to auscultation bilaterally without wheezes or crackles Cardiovascular: Regular rate and rhythm without murmur  Abdomen: Nontender, nondistended, soft, bowel sounds positive, no rebound, PEG tube insertion site clean and dry Extremities: No significant cyanosis, clubbing, or edema bilateral lower extremities  CBC: Recent Labs  Lab 10/07/21 0409  WBC 7.4  HGB 13.2  HCT 40.6  MCV 84.6  PLT 449    Basic Metabolic Panel: Recent Labs  Lab 10/07/21 0409  NA 136  K 3.7  CL 99  CO2 25  GLUCOSE 138*  BUN 13  CREATININE 0.50*  CALCIUM 9.4  MG 1.6*  PHOS 4.3    GFR: Estimated Creatinine Clearance: 92 mL/min (A) (by C-G formula based on SCr of 0.5 mg/dL (L)).   Scheduled Meds:  amLODipine  10 mg Per Tube Daily   aspirin  81 mg Per Tube Daily   atorvastatin  80 mg Per Tube Daily   carvedilol  37.5 mg Per Tube BID WC   diclofenac Sodium  2 g Topical QID   feeding supplement (PROSource TF20)  60 mL Per Tube Daily   fiber supplement (BANATROL TF)  60 mL Per Tube BID   folic acid  1 mg Per Tube Daily   free water  100 mL Per Tube Q4H   guaiFENesin  10 mL Per Tube Q4H   hydrochlorothiazide  25 mg Per Tube Daily   insulin aspart  0-9 Units Subcutaneous Q4H   insulin aspart  10 Units Subcutaneous Q4H   insulin detemir  30 Units Subcutaneous BID   multivitamin with minerals  1 tablet Per Tube Daily   mouth rinse  15 mL Mouth Rinse Q2H   polyethylene glycol  17 g Per Tube BID   potassium chloride  40 mEq Per Tube BID   senna-docusate  2 tablet Per Tube BID   thiamine  100 mg Per Tube Daily   ticagrelor  90 mg Per Tube BID   Continuous Infusions:  feeding supplement ([name]) 1,000 mL (10/06/21 1718)     LOS: 65 days   [name], MD [facility] Office  [phone] Pager - Text Page per Shea Evans  If 7PM-7AM, please contact night-coverage per Amion 10/07/2021, 8:29 AM

## 2021-10-08 LAB — GLUCOSE, CAPILLARY
Glucose-Capillary: 114 mg/dL — ABNORMAL HIGH (ref 70–99)
Glucose-Capillary: 120 mg/dL — ABNORMAL HIGH (ref 70–99)
Glucose-Capillary: 149 mg/dL — ABNORMAL HIGH (ref 70–99)
Glucose-Capillary: 107 mg/dL — ABNORMAL HIGH (ref 70–99)
Glucose-Capillary: 149 mg/dL — ABNORMAL HIGH (ref 70–99)
Glucose-Capillary: 171 mg/dL — ABNORMAL HIGH (ref 70–99)

## 2021-10-08 LAB — MAGNESIUM: Magnesium: 1.8 mg/dL (ref 1.7–2.4)

## 2021-10-08 LAB — BASIC METABOLIC PANEL
Anion gap: 12 (ref 5–15)
BUN: 14 mg/dL (ref 8–23)
CO2: 24 mmol/L (ref 22–32)
Calcium: 8.9 mg/dL (ref 8.9–10.3)
Chloride: 100 mmol/L (ref 98–111)
Creatinine, Ser: 0.56 mg/dL — ABNORMAL LOW (ref 0.61–1.24)
GFR, Estimated: >60 mL/min (ref >60)
Glucose, Bld: 138 mg/dL — ABNORMAL HIGH (ref 70–99)
Potassium: 3.6 mmol/L (ref 3.5–5.1)
Sodium: 136 mmol/L (ref 135–145)

## 2021-10-08 NOTE — Consult Note (Addendum)
[place] Nurse Re-consult Note: Reason for Consult: WOC consult was performed on 8/22 for moisture associated skin damage. Requested to re-consult related to a decline in the appearance.  Wound type: Bilat buttocks are red, moist and macerated.  Appearance is consistent with moisture associated skin damage which has progressed to full thickness skin loss on bilat buttocks.   ICD-10 CM Codes for Irritant Dermatitis L24A2 - Due to fecal, urinary or dual incontinence  These are NOT pressure injuries; it is full thickness skin loss related to moisture.  Left buttock 2X2X.1cm red and moist, right buttock red and moist 4X3X.1cm, loose peeling skin surrounding both sites.  Pt is already on an air mattress to reduce pressure and has Desitin ordered to protect skin and assist with repelling moisture.  I have no further recommendations for the moisture associated skin damage at this time.  Please re-consult if further assistance is needed.  Thank-you,  Julien Girt MSN, [place], [place], [place], [place]

## 2021-10-08 NOTE — Progress Notes (Signed)
Nutrition Follow-up  DOCUMENTATION CODES:   Not applicable  INTERVENTION:  Continue EN at goal rate: -Jevity 1.5 @ 43m/hr x 24hrs (14493mtotal volume) -Prosource TF20 q day -Banatrol BID -FWF  NUTRITION DIAGNOSIS:  Inadequate oral intake related to acute illness as evidenced by NPO status.  GOAL:  Patient will meet greater than or equal to 90% of their needs progressing  MONITOR:  Vent status, Labs, Weight trends, TF tolerance  REASON FOR ASSESSMENT:  Follow Up Enteral/tube feeding initiation and management  ASSESSMENT:   [age]o male admitted post brain stem stroke involving ventral medulla requiring stent placement to right vertebro-basilar junction. PMH includes HTN, TBI, DM, HLD  Spoke with RN, pt is tolerating EN at goal rate with no nutrition related concerns at this time.   EN goal: Jevity 1.5 @ 6040mr x 24hrs (1440m104mtal volume) w/ Prosource TF20 daily, banatrol BID and 100mL49me water flush q 4hrs to provide 2330kcal (29kcal/kg), 112g protein (1.4g/kg) and 1694mL 56m water (21mL/k43m  Weight loss noted during hospitalization. Weight appears relatively stable at a~80kg.   Labs reviewed and within acceptable limits.  Diet Order:   Diet Order             Diet NPO time specified  Diet effective midnight                   EDUCATION NEEDS:  Not appropriate for education at this time  Skin:  Skin Assessment: Skin Integrity Issues: Skin Integrity Issues:: Other (Comment) Other: non-pressure wound bilateral buttock  Last BM:  9/27  Height:  Ht Readings from Last 1 Encounters:  08/04/21 '5\' 7"'$  (1.702 m)    Weight:  Wt Readings from Last 1 Encounters:  10/04/21 79.8 kg   BMI:  Body mass index is 27.57 kg/m.  Estimated Nutritional Needs:   Kcal:  2000-2200 kcals  Protein:  100-115 g  Fluid:  >/= 2 L   Katie BCandise BowensD, LDN, CNSC See AMiON for contact information

## 2021-10-08 NOTE — Progress Notes (Signed)
RN place consult for wound care to reevaluate patients stage 2 pressure injury to determine if needing updated wound care instructions, as wound appears worse.

## 2021-10-08 NOTE — Progress Notes (Signed)
Occupational Therapy Treatment Patient Details Name: Joshua Donovan MRN: [id number] DOB: 1955/06/13 Today's Date: 10/08/2021   History of present illness Pt is a 66 y.o. male who presented 08/02/21 with R foot pain and R-sided weakness. Transferred to [facility] 7/25. 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 S/p stent placement to the R vertebrobasilar junction 7/26, failed extubation. Trach and PEG placed 7/28. S/p bronchoscopic evaluation, oral reintubation and tracheostomy removal with stoma packing 7/31 due to trach site bleeding. S/p trach revision 8/2. PMH: DM, GERD, TBI with prior ICH, HLD, HTN   OT comments  Pt completed supine to sit EOB with total assist and then worked on sitting balance with head control EOB.  He was able to initiate and complete cervical extension to neutral on two occasions without physical assist but then needed min to mod for other attempts.  Vitals stable throughout with O2 sats greater than 92% on 5 Ls medical air at 28% trach collar.  Feel he will continue to benefit from acute care OT at this time, will continue to follow.     Recommendations for follow up therapy are one component of a multi-disciplinary discharge planning process, led by the attending physician.  Recommendations may be updated based on patient status, additional functional criteria and insurance authorization.    Follow Up Recommendations  OT at Long-term acute care hospital    Assistance Recommended at Discharge Frequent or constant Supervision/Assistance  Patient can return home with the following  Two people to help with walking  and/or transfers;Two people to help with bathing/dressing/bathroom;Assistance with cooking/housework;Assistance with feeding;Direct supervision/assist for medications management;Direct supervision/assist for financial management;Assist for transportation;Help with stairs or ramp for entrance   Equipment Recommendations  Other (comment) (TBD next venue of care)       Precautions / Restrictions Precautions Precautions: Fall Precaution Comments: trach collar, PEG, SBP < 180, abdominal binder, copious trach secretions, watch HR Restrictions Weight Bearing Restrictions: No       Mobility Bed Mobility Overal bed mobility: Needs Assistance   Rolling: Total assist, +2 for physical assistance Sidelying to sit: Total assist, +2 for physical assistance            Transfers Overall transfer level: Needs assistance   Transfers: Bed to chair/wheelchair/BSC     Squat pivot transfers: Total assist, +2 safety/equipment       General transfer comment: Squat pivot to the wheelchair with use of bed pad underneath.     Balance Overall balance assessment: Needs assistance Sitting-balance support: Feet supported Sitting balance-Leahy Scale: Zero Sitting balance - Comments: total assist for static sitting                                   ADL either performed or assessed with clinical judgement   ADL Overall ADL's : Needs assistance/impaired                             Toileting- Clothing Manipulation and Hygiene: Total assistance;+2 for physical assistance;Bed level Toileting - Clothing Manipulation Details (indicate cue type and reason): cleaning up  bowel incontinence rolling in the bed     Functional mobility during ADLs: Total assistance;+2 for physical assistance (total +2 supine to sit EOB and for transfer squat pivot) General ADL Comments: Educated pt's daughter on PROM exercises for the shoulder, elbow, wrist, and digits to start session.  Will need  to provide handout next visit as well.  Worked on sitting balance and head control EOB during session.  Still needing total assist for static sitting balance.  He was able to complete two intervals of cervical extension to neutral without therapist assist and with some extensor tone noted.  Most attempts still require min to mod assist for initiation from cervical flexion to extension.  Decreased ability to keep head at midline laterally with slight tilt to the right.  Transferred squat pivot to the tilt in space wheelchair at end of session with total +2 assist.  Positioned UEs, trunk, and head with pillows.               Cognition Arousal/Alertness: Awake/alert Behavior During Therapy: WFL for tasks assessed/performed Overall Cognitive Status: Impaired/Different from baseline Area of Impairment: Following commands                       Following Commands: Follows one step commands with increased time       General Comments: PMV in place with mod instructional cueing to try and talk louder as voice is still a whisper              General Comments VSS on medical air through trach collar. SpO2 90-97%    Pertinent Vitals/ Pain       Pain Assessment Pain Assessment: Faces Pain Score: 0-No pain         Frequency  Min 2X/week        Progress Toward Goals  OT Goals(current goals can now be found in the care plan section)  Progress towards OT goals: Progressing toward goals  Acute Rehab OT Goals Patient Stated Goal: Pt did not state but agreeable to working in therapy OT Goal Formulation: With patient Time For Goal Achievement: 10/20/21 Potential to Achieve Goals: Good  Plan Discharge plan remains appropriate    Co-evaluation    PT/OT/SLP Co-Evaluation/Treatment: Yes Reason for Co-Treatment: Complexity of the patient's impairments (multi-system involvement);For patient/therapist safety PT goals addressed during session: Mobility/safety with  mobility;Balance;Proper use of DME;Strengthening/ROM OT goals addressed during session: ADL's and self-care      AM-PAC OT "6 Clicks" Daily Activity     Outcome Measure   Help from another person eating meals?: Total Help from another person taking care of personal grooming?: Total Help from another person toileting, which includes using toliet, bedpan, or urinal?: Total Help from another person bathing (including washing, rinsing, drying)?: Total   Help from another person to put on and taking off regular lower body clothing?: Total 6 Click Score: 5    End of Session Equipment Utilized During Treatment: Oxygen  OT Visit Diagnosis: Other symptoms and signs involving the nervous system (R29.898);Muscle weakness (generalized) (M62.81);Feeding difficulties (R63.3)   Activity Tolerance Patient tolerated treatment well   Patient Left with call bell/phone within reach;in chair;with chair alarm set   Nurse Communication Mobility status;Need for lift equipment        Time: 8588-5027 OT Time Calculation (min): 58 min  Charges: OT General Charges $OT Visit: 1 Visit OT Treatments $Self Care/Home Management : 23-37 mins  Tyonna Talerico OTR/L 10/08/2021, 2:35 PM

## 2021-10-08 NOTE — Progress Notes (Signed)
Physical Therapy Treatment Patient Details Name: Joshua Donovan MRN: [id number] DOB: 09-19-1955 Today's Date: 10/08/2021   History of Present Illness Pt is a 66 y.o. male who presented 08/02/21 with R foot pain and R-sided weakness. Transferred to [facility] 7/25. 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 S/p stent placement to the R vertebrobasilar junction 7/26, failed extubation. Trach and PEG placed 7/28. S/p bronchoscopic evaluation, oral reintubation and tracheostomy removal with stoma packing 7/31 due to trach site bleeding. S/p trach revision 8/2. PMH: DM, GERD, TBI with prior ICH, HLD, HTN    PT Comments    The pt was agreeable to session, continues to present with limitations due to deficits in strength in core and extremities. The pt required totalA to complete bed mobility and dependent transfer from sitting EOB to tilt-in-space WC. The pt required maxA to maintain static sitting EOB due to strong L and posterior lean with both max cues and  physical assist to correct head/neck positioning. The pt will continue to benefit from skilled PT acutely to progress activity tolerance and improvements in strength.    Recommendations for follow up therapy are one component of a multi-disciplinary discharge planning process, led by the attending physician.  Recommendations may be updated based on patient status, additional functional criteria and insurance authorization.  Follow Up Recommendations  PT at Long-term acute care hospital Can patient physically be transported by private vehicle: No   Assistance Recommended at Discharge  Frequent or constant Supervision/Assistance  Patient can return home with the following Assistance with cooking/housework;Two people to help with walking and/or transfers;Two people to help with bathing/dressing/bathroom;Assistance with feeding;Direct supervision/assist for medications management;Direct supervision/assist for financial management;Assist for transportation;Help with stairs or ramp for entrance   Equipment Recommendations  Other (comment) (defer to post acute)    Recommendations for Other Services       Precautions / Restrictions Precautions Precautions: Fall Precaution Comments: trach collar, PEG, SBP < 180, abdominal binder, copious trach secretions, watch HR Restrictions Weight Bearing Restrictions: No     Mobility  Bed Mobility Overal bed mobility: Needs Assistance Bed Mobility: Rolling, Sidelying to Sit Rolling: Total assist, +2 for physical assistance Sidelying to sit: Total assist, +2 for physical assistance       General bed mobility comments: totalA to complete rolling for cleaning in bed, assist to steady trunk and reposition hips at EOB    Transfers Overall transfer level: Needs assistance Equipment used: 1 person hand held assist Transfers: Bed to chair/wheelchair/BSC       Squat pivot transfers: Total assist, +2 safety/equipment    Lateral/Scoot Transfers: Total assist, +2 safety/equipment General transfer comment: lateral scoot along EOB to position, then dependent transfer to tilt-in-space WC    Ambulation/Gait               General Gait Details: unable   Modified Rankin (Stroke Patients Only) Modified Rankin (Stroke Patients Only) Pre-Morbid Rankin Score: No symptoms Modified Rankin: Severe disability     Balance Overall balance assessment: Needs assistance Sitting-balance support: Feet supported Sitting balance-Leahy Scale: Zero Sitting balance - Comments: total assist needed for sitting EOB; working on head control with  pt able to rotate few  degrees to L and R with increased time, also able to right his head without using full extension Postural control: Left lateral lean, Posterior lean                                  Cognition Arousal/Alertness: Awake/alert Behavior During Therapy: WFL for tasks assessed/performed Overall Cognitive Status: Impaired/Different from baseline Area of Impairment: Following commands                               General Comments: attempted to use PSMVwith pt needing increased cues to increase volume of voice. attempts to follow commands with head and neck, limited by motor impairments        Exercises Other Exercises Other Exercises: cervical lateral flexion and rotation AAROM    General Comments General comments (skin integrity, edema, etc.): VSS on medical air through trach collar. SpO2 90-97%      Pertinent Vitals/Pain Pain Assessment Pain Assessment: No/denies pain Faces Pain Scale: No hurt Pain Intervention(s): Monitored during session     PT Goals (current goals can now be found in the care plan section) Acute Rehab PT Goals Patient Stated Goal: agreeable to session PT Goal Formulation: With patient Time For Goal Achievement: 10/15/21 Potential to Achieve Goals: Fair Progress towards PT goals: Progressing toward goals    Frequency    Min 3X/week      PT Plan Current plan remains appropriate    Co-evaluation PT/OT/SLP Co-Evaluation/Treatment: Yes Reason for Co-Treatment: Complexity of the patient's impairments (multi-system involvement);Necessary to address cognition/behavior during functional activity;To address functional/ADL transfers;For patient/therapist safety PT goals addressed during session: Mobility/safety with mobility;Balance;Proper use of DME;Strengthening/ROM        AM-PAC PT "6 Clicks" Mobility   Outcome Measure  Help needed turning from your back to your side while in a flat bed without using  bedrails?: Total Help needed moving from lying on your back to sitting on the side of a flat bed without using bedrails?: Total Help needed moving to and from a bed to a chair (including a wheelchair)?: Total Help needed standing up from a chair using your arms (e.g., wheelchair or bedside chair)?: Total Help needed to walk in hospital room?: Total Help needed climbing 3-5 steps with a railing? : Total 6 Click Score: 6    End of Session Equipment Utilized During Treatment: Gait belt Activity Tolerance: Patient tolerated treatment well Patient left: in chair;with call bell/phone within reach;with chair alarm set Nurse Communication: Mobility status PT Visit Diagnosis: Muscle weakness (generalized) (M62.81);Other symptoms and signs involving the nervous system (R29.898);Other abnormalities of gait and mobility (R26.89);Other (comment) (quadriparesis) Hemiplegia - Right/Left: Right Hemiplegia - dominant/non-dominant: Dominant Hemiplegia - caused by: Cerebral infarction     Time: 3151-7616 PT Time Calculation (min) (ACUTE ONLY): 44 min  Charges:  $Therapeutic Exercise: 8-22 mins $Therapeutic Activity: 8-22 mins                     [name], PT, DPT   Acute Rehabilitation Department   [name] 10/08/2021, 2:06 PM

## 2021-10-08 NOTE — Progress Notes (Signed)
Joshua Donovan  ASN:[id number] DOB: [birth year] DOA: 08/03/2021 PCP: [facility]    Brief Narrative:  66 year old with a history of ICH, DM 2, HTN, and HLD who presented to [facility] 7/24 with right hand numbness and weakness and was diagnosed with a small brainstem stroke.  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  He required intubation and was transferred to [facility] for an interventional radiology intervention.  He ultimately required tracheostomy and PEG tube placement, and has subsequently suffered a prolonged hospital stay.  Significant events:  7/24 presented to [facility], ventral medulla CVA 7/25 tx to [facility], treated with Cleviprex 7/26 cerebral angiogram with stent placement to right vertebro-basilar junction, failed extubation, MRI brain> mild extension of stroke, now involving bilateral medial medullary, patent basilar artery and R VBJ stent  7/28 bedside tracheostomy by PCCM and PEG tube placement by General Surgery 7/31 Bleeding around trach site, bright red blood.  Underwent bronchoscopy, Trach Removed, patient orally reintubated, stoma packed. 8/2 ENT took patient to OR for trach redo 8/9 transferred to medical floor on tracheostomy 8/12 vomiting after bolus feeding with worsening hypoxia 8/13 copious bleeding from the heparin injection site controlled with pressure dressing 8/14 bleeding has resolved.  Trach secretions blood-tinged. 8/15 insurance denied LTAC appeal.  CSW working on SNF. 8/28: peer to peer for LTAC looks like this was denied 8/29: V. tach runs - increased metoprolol  9/2: Slightly increased work of [place] for chest x-ray showed no interval changes 9/5:  Ancef started 2/2 secretions --no wbc or fever 9/12 completed abx course    Consultants:  Neurology Interventional Radiology PCCM Cardiology  Goals of Care:  Code Status: Full Code   DVT prophylaxis: SCDs  Interim  Hx: Afebrile.  Vital signs stable.  Resting comfortably in bed.  Pleasant and interactive.  No complaints.  Assessment & Plan:  Acute stroke of medulla oblongata Status post stent to right vertebrobasilar junction 7/26 -persisting quadriplegia -continue aspirin Brilinta and Lipitor per Neurology - Neurology signed off 8/4 -will need outpatient Neurology follow-up -continue PT/OT/SLP -seeking SNF placement as insurance denied LTAC  Aspiration pneumonia - hospital-acquired MSSA pneumonia  CXR 9/3 confirmed bibasilar infiltrates -treated with Ancef 9/5 > 9/12 -no clinical evidence of active infection at present  Tracheostomy dependence Tracheostomy originally placed 1/93 - this was complicated by significant bleeding requiring reintubation and discontinuation of trach - ENT revised tracheostomy 8/2  History of alcohol abuse Continue empiric thiamine, multivitamin, and folic acid  Uncontrolled DM2 with hyperglycemia A1c 8.3 during this hospital stay -CBG remains well controlled  HTN Continue amlodipine, Coreg, HCTZ - blood pressure remains controlled  Mildly elevated LFTs Now resolved -viral hepatitis panel negative  NSVT Responded well to [place] - Cardiology has evaluated  Oropharyngeal dysphagia -PEG tube dependent Due to above -continue tube feeds and free water via tube  Hypokalemia Corrected with supplementation  Hypomagnesemia Corrected with supplementation  Small sacral decubitus POA Continue wound care/RN directed monitoring   Family Communication: No family present at time of exam Disposition: Awaiting SNF placement   Objective: Blood pressure 137/80, pulse 86, temperature 98.3 F (36.8 C), temperature source Oral, resp. rate (!) 33, height '5\' 7"'$  (1.702 m), weight 79.8 kg, SpO2 95 %.  Intake/Output Summary (Last 24 hours) at 10/08/2021 0838 Last data filed at 10/08/2021 7902 Gross per 24 hour  Intake --  Output 1300 ml  Net -1300 ml    Filed Weights    09/22/21 0459  10/02/21 0500 10/04/21 0500  Weight: 85.7 kg 79.8 kg 79.8 kg    Examination: General: No acute respiratory distress Lungs: Clear to auscultation bilaterally  Cardiovascular: Regular rate and rhythm  Abdomen: Nontender, nondistended, soft, bowel sounds positive Extremities: No significant edema bilateral lower extremities  CBC: Recent Labs  Lab 10/07/21 0409  WBC 7.4  HGB 13.2  HCT 40.6  MCV 84.6  PLT 017    Basic Metabolic Panel: Recent Labs  Lab 10/07/21 0409 10/08/21 0325  NA 136 136  K 3.7 3.6  CL 99 100  CO2 25 24  GLUCOSE 138* 138*  BUN 13 14  CREATININE 0.50* 0.56*  CALCIUM 9.4 8.9  MG 1.6* 1.8  PHOS 4.3  --     GFR: Estimated Creatinine Clearance: 92 mL/min (A) (by C-G formula based on SCr of 0.56 mg/dL (L)).   Scheduled Meds:  amLODipine  10 mg Per Tube Daily   aspirin  81 mg Per Tube Daily   atorvastatin  80 mg Per Tube Daily   carvedilol  37.5 mg Per Tube BID WC   diclofenac Sodium  2 g Topical QID   feeding supplement (PROSource TF20)  60 mL Per Tube Daily   fiber supplement (BANATROL TF)  60 mL Per Tube BID   folic acid  1 mg Per Tube Daily   free water  100 mL Per Tube Q4H   guaiFENesin  10 mL Per Tube Q4H   hydrochlorothiazide  25 mg Per Tube Daily   insulin aspart  0-9 Units Subcutaneous Q4H   insulin aspart  10 Units Subcutaneous Q4H   insulin detemir  30 Units Subcutaneous BID   multivitamin with minerals  1 tablet Per Tube Daily   mouth rinse  15 mL Mouth Rinse Q2H   polyethylene glycol  17 g Per Tube BID   potassium chloride  40 mEq Per Tube BID   senna-docusate  2 tablet Per Tube BID   thiamine  100 mg Per Tube Daily   ticagrelor  90 mg Per Tube BID   Continuous Infusions:  feeding supplement ([name]) 1,000 mL (10/07/21 2239)     LOS: 66 days   [name], MD [facility] Office  [phone] Pager - Text Page per Shea Evans  If 7PM-7AM, please contact night-coverage per  Amion 10/08/2021, 8:38 AM

## 2021-10-09 LAB — GLUCOSE, CAPILLARY
Glucose-Capillary: 162 mg/dL — ABNORMAL HIGH (ref 70–99)
Glucose-Capillary: 97 mg/dL (ref 70–99)
Glucose-Capillary: 153 mg/dL — ABNORMAL HIGH (ref 70–99)
Glucose-Capillary: 112 mg/dL — ABNORMAL HIGH (ref 70–99)
Glucose-Capillary: 105 mg/dL — ABNORMAL HIGH (ref 70–99)
Glucose-Capillary: 95 mg/dL (ref 70–99)

## 2021-10-09 NOTE — Progress Notes (Signed)
Joshua Donovan  OMV:[id number] DOB: May 01, 1955 DOA: 08/03/2021 PCP: [facility] [place]    Brief Narrative:  66 year old with a history of ICH, DM 2, HTN, and HLD who presented to [facility] 7/24 with right hand numbness and weakness and was diagnosed with a small brainstem stroke.  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  He required intubation and was transferred to [facility] for an interventional radiology intervention.  He ultimately required tracheostomy and PEG tube placement, and has subsequently suffered a prolonged hospital stay.  Significant events:  7/24 presented to [facility], ventral medulla CVA 7/25 tx to [facility], treated with Cleviprex 7/26 cerebral angiogram with stent placement to right vertebro-basilar junction, failed extubation, MRI brain> mild extension of stroke, now involving bilateral medial medullary, patent basilar artery and R VBJ stent  7/28 bedside tracheostomy by PCCM and PEG tube placement by General Surgery 7/31 Bleeding around trach site, bright red blood.  Underwent bronchoscopy, Trach Removed, patient orally reintubated, stoma packed. 8/2 ENT took patient to OR for trach redo 8/9 transferred to medical floor on tracheostomy 8/12 vomiting after bolus feeding with worsening hypoxia 8/13 copious bleeding from the heparin injection site controlled with pressure dressing 8/14 bleeding has resolved.  Trach secretions blood-tinged. 8/15 insurance denied LTAC appeal.  CSW working on SNF. 8/28: peer to peer for LTAC looks like this was denied 8/29: V. tach runs - increased metoprolol  9/2: Slightly increased work of [place] 99 for chest x-ray showed no interval changes 9/5:  Ancef started 2/2 secretions --no wbc or fever 9/12 completed abx course    Consultants:  Neurology Interventional Radiology PCCM Cardiology  Goals of Care:  Code Status: Full Code   DVT prophylaxis: SCDs  Interim Hx: No  acute events reported since my last visit.  Afebrile.  Vital signs stable. Resting comfortably in bed.   Assessment & Plan:  Acute stroke of medulla oblongata Status post stent to right vertebrobasilar junction 7/26 - persisting quadriplegia - continue aspirin Brilinta and Lipitor per Neurology - Neurology signed off 8/4 - will need outpatient Neurology follow-up - continue PT/OT/SLP - seeking SNF placement as insurance denied LTAC  Aspiration pneumonia - hospital-acquired MSSA pneumonia - resolved CXR 9/3 confirmed bibasilar infiltrates - treated with Ancef 9/5 > 9/12 - no clinical evidence of active infection at present  Tracheostomy dependence Tracheostomy originally placed 6/28 - this was complicated by significant bleeding requiring reintubation and discontinuation of trach - ENT revised tracheostomy 8/2  History of alcohol abuse Continue empiric thiamine, multivitamin, and folic acid  Uncontrolled DM2 with hyperglycemia A1c 8.3 during this hospital stay -CBG well controlled  HTN blood pressure remains controlled  Mildly elevated LFTs Now resolved -viral hepatitis panel negative  NSVT Responded well to [place] - Cardiology has evaluated  Oropharyngeal dysphagia -PEG tube dependent Due to above - continue tube feeds and free water via tube  Hypokalemia Corrected with supplementation  Hypomagnesemia Corrected with supplementation  Small sacral decubitus POA Continue wound care/RN directed monitoring  Moisture associted skin damage B buttocks  Continue care as suggested by WOC   Family Communication: Spoke with daughter at bedside Disposition: Awaiting SNF placement   Objective: Blood pressure (!) 141/95, pulse 89, temperature 98.1 F (36.7 C), temperature source Oral, resp. rate (!) 30, height '5\' 7"'$  (1.702 m), weight 79.8 kg, SpO2 95 %.  Intake/Output Summary (Last 24 hours) at 10/09/2021 0842 Last data filed at 10/09/2021 3662 Gross per 24 hour  Intake --  Output 800 ml  Net -800 ml    Filed Weights   09/22/21 0459 10/02/21 0500 10/04/21 0500  Weight: 85.7 kg 79.8 kg 79.8 kg    Examination: General: No acute respiratory distress -alert pleasant and interactive Lungs: Clear to auscultation bilaterally  Cardiovascular: Regular rate and rhythm  Abdomen: Nontender, nondistended, soft, bowel sounds positive Extremities: No significant edema bilateral lower extremities  CBC: Recent Labs  Lab 10/07/21 0409  WBC 7.4  HGB 13.2  HCT 40.6  MCV 84.6  PLT 673    Basic Metabolic Panel: Recent Labs  Lab 10/07/21 0409 10/08/21 0325  NA 136 136  K 3.7 3.6  CL 99 100  CO2 25 24  GLUCOSE 138* 138*  BUN 13 14  CREATININE 0.50* 0.56*  CALCIUM 9.4 8.9  MG 1.6* 1.8  PHOS 4.3  --     GFR: Estimated Creatinine Clearance: 92 mL/min (A) (by C-G formula based on SCr of 0.56 mg/dL (L)).   Scheduled Meds:  amLODipine  10 mg Per Tube Daily   aspirin  81 mg Per Tube Daily   atorvastatin  80 mg Per Tube Daily   carvedilol  37.5 mg Per Tube BID WC   diclofenac Sodium  2 g Topical QID   feeding supplement (PROSource TF20)  60 mL Per Tube Daily   fiber supplement (BANATROL TF)  60 mL Per Tube BID   folic acid  1 mg Per Tube Daily   free water  100 mL Per Tube Q4H   guaiFENesin  10 mL Per Tube Q4H   hydrochlorothiazide  25 mg Per Tube Daily   insulin aspart  0-9 Units Subcutaneous Q4H   insulin aspart  10 Units Subcutaneous Q4H   insulin detemir  30 Units Subcutaneous BID   multivitamin with minerals  1 tablet Per Tube Daily   mouth rinse  15 mL Mouth Rinse Q2H   polyethylene glycol  17 g Per Tube BID   potassium chloride  40 mEq Per Tube BID   senna-docusate  2 tablet Per Tube BID   thiamine  100 mg Per Tube Daily   ticagrelor  90 mg Per Tube BID   Continuous Infusions:  feeding supplement ([name]) 1,000 mL (10/09/21 0649)     LOS: 42 days   [name], MD [facility] Office  [phone] Pager  - Text Page per Shea Evans  If 7PM-7AM, please contact night-coverage per Amion 10/09/2021, 8:42 AM

## 2021-10-10 LAB — GLUCOSE, CAPILLARY
Glucose-Capillary: 130 mg/dL — ABNORMAL HIGH (ref 70–99)
Glucose-Capillary: 158 mg/dL — ABNORMAL HIGH (ref 70–99)
Glucose-Capillary: 177 mg/dL — ABNORMAL HIGH (ref 70–99)
Glucose-Capillary: 187 mg/dL — ABNORMAL HIGH (ref 70–99)
Glucose-Capillary: 194 mg/dL — ABNORMAL HIGH (ref 70–99)
Glucose-Capillary: 95 mg/dL (ref 70–99)

## 2021-10-10 MED ORDER — INSULIN DETEMIR 100 UNIT/ML ~~LOC~~ SOLN: 34.0000 [IU] | Freq: Two times a day (BID) | SUBCUTANEOUS | Status: DC

## 2021-10-10 MED ORDER — INSULIN DETEMIR 100 UNIT/ML ~~LOC~~ SOLN
40.0000 [IU] | Freq: Two times a day (BID) | SUBCUTANEOUS | Status: DC
  Administered 2021-10-10 – 2021-10-19 (×20): 40 [IU] via SUBCUTANEOUS
  Filled 2021-10-10 (×23): qty 0.4

## 2021-10-10 NOTE — Progress Notes (Signed)
Joshua Donovan  [name]:[id number] DOB: Apr 14, 1955 DOA: 08/03/2021 PCP: [facility] [place]    Brief Narrative:  66 year old with a history of ICH, DM 2, HTN, and HLD who presented to [facility] 7/24 with right hand numbness and weakness and was diagnosed with a small brainstem stroke.  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  He required intubation and was transferred to [facility] for an interventional radiology intervention.  He ultimately required tracheostomy and PEG tube placement, and has subsequently suffered a prolonged hospital stay.  Significant events:  7/24 presented to [facility], ventral medulla CVA 7/25 tx to [facility], treated with Cleviprex 7/26 cerebral angiogram with stent placement to right vertebro-basilar junction, failed extubation, MRI brain> mild extension of stroke, now involving bilateral medial medullary, patent basilar artery and R VBJ stent  7/28 bedside tracheostomy by PCCM and PEG tube placement by General Surgery 7/31 Bleeding around trach site, bright red blood.  Underwent bronchoscopy, Trach Removed, patient orally reintubated, stoma packed. 8/2 ENT took patient to OR for trach redo 8/9 transferred to medical floor on tracheostomy 8/12 vomiting after bolus feeding with worsening hypoxia 8/13 copious bleeding from the heparin injection site controlled with pressure dressing 8/14 bleeding has resolved.  Trach secretions blood-tinged. 8/15 insurance denied LTAC appeal.  CSW working on SNF. 8/28: peer to peer for LTAC looks like this was denied 8/29: Joshua Donovan runs - increased metoprolol  9/2: Slightly increased work of [place] 99 for chest x-ray showed no interval changes 9/5:  Ancef started 2/2 secretions --no wbc or fever 9/12 completed abx course    Consultants:  Neurology Interventional Radiology PCCM Cardiology  Goals of Care:  Code Status: Full Code   DVT prophylaxis: SCDs  Interim  Hx: Afebrile.  Vital signs stable.  No acute events noted overnight.  Resting comfortably in bed in no distress.  Assessment & Plan:  Acute stroke of medulla oblongata Status post stent to right vertebrobasilar junction 7/26 - persisting quadriplegia - continue aspirin Brilinta and Lipitor per Neurology - Neurology signed off 8/4 - will need outpatient Neurology follow-up - continue PT/OT/SLP - seeking SNF placement as insurance denied LTAC  Aspiration pneumonia - hospital-acquired MSSA pneumonia - resolved CXR 9/3 confirmed bibasilar infiltrates - treated with Ancef 9/5 > 9/12 - no clinical evidence of active infection at present  Tracheostomy dependence Tracheostomy originally placed 5/63 - this was complicated by significant bleeding requiring reintubation and discontinuation of trach - ENT revised tracheostomy 8/2  History of alcohol abuse Continue empiric thiamine, multivitamin, and folic acid  Uncontrolled DM2 with hyperglycemia A1c 8.3 during this hospital stay -CBG well controlled  HTN blood pressure remains controlled  Mildly elevated LFTs - resolved  Now resolved -viral hepatitis panel negative  NSVT - resolved  Responded well to [place] - Cardiology has evaluated   Oropharyngeal dysphagia - PEG tube dependent Due to above - continue tube feeds and free water via tube  Hypokalemia Corrected with supplementation  Hypomagnesemia Corrected with supplementation  Small sacral decubitus POA Continue wound care/RN directed monitoring  Moisture associted skin damage B buttocks  Continue care as suggested by WOC   Family Communication:  Disposition: Awaiting SNF placement   Objective: Blood pressure 130/85, pulse 92, temperature 98.3 F (36.8 C), temperature source Oral, resp. rate 16, height '5\' 7"'$  (1.702 m), weight 79.8 kg, SpO2 96 %.  Intake/Output Summary (Last 24 hours) at 10/10/2021 0901 Last data filed at 10/10/2021 0327 Gross per 24 hour  Intake --  Output  900 ml  Net -900 ml    Filed Weights   09/22/21 0459 10/02/21 0500 10/04/21 0500  Weight: 85.7 kg 79.8 kg 79.8 kg    Examination: General: No acute respiratory distress  Lungs: No increased work of breathing/increased respiratory effort Cardiovascular: Regular rate   CBC: Recent Labs  Lab 10/07/21 0409  WBC 7.4  HGB 13.2  HCT 40.6  MCV 84.6  PLT 885    Basic Metabolic Panel: Recent Labs  Lab 10/07/21 0409 10/08/21 0325  NA 136 136  K 3.7 3.6  CL 99 100  CO2 25 24  GLUCOSE 138* 138*  BUN 13 14  CREATININE 0.50* 0.56*  CALCIUM 9.4 8.9  MG 1.6* 1.8  PHOS 4.3  --     GFR: Estimated Creatinine Clearance: 92 mL/min (A) (by C-G formula based on SCr of 0.56 mg/dL (L)).   Scheduled Meds:  amLODipine  10 mg Per Tube Daily   aspirin  81 mg Per Tube Daily   atorvastatin  80 mg Per Tube Daily   carvedilol  37.5 mg Per Tube BID WC   diclofenac Sodium  2 g Topical QID   feeding supplement (PROSource TF20)  60 mL Per Tube Daily   fiber supplement (BANATROL TF)  60 mL Per Tube BID   folic acid  1 mg Per Tube Daily   free water  100 mL Per Tube Q4H   guaiFENesin  10 mL Per Tube Q4H   hydrochlorothiazide  25 mg Per Tube Daily   insulin aspart  0-9 Units Subcutaneous Q4H   insulin aspart  10 Units Subcutaneous Q4H   insulin detemir  30 Units Subcutaneous BID   multivitamin with minerals  1 tablet Per Tube Daily   mouth rinse  15 mL Mouth Rinse Q2H   polyethylene glycol  17 g Per Tube BID   potassium chloride  40 mEq Per Tube BID   senna-docusate  2 tablet Per Tube BID   thiamine  100 mg Per Tube Daily   ticagrelor  90 mg Per Tube BID   Continuous Infusions:  feeding supplement ([name]) 1,000 mL (10/10/21 0054)     LOS: 84 days   [name], MD [facility] Office  [phone] Pager - Text Page per Amion  If 7PM-7AM, please contact night-coverage per Amion 10/10/2021, 9:01 AM

## 2021-10-10 NOTE — Plan of Care (Signed)
Problem: Education: Goal: Knowledge of General Education information will improve Description: Including pain rating scale, medication(s)/side effects and non-pharmacologic comfort measures Outcome: Progressing   Problem: Health Behavior/Discharge Planning: Goal: Ability to manage health-related needs will improve Outcome: Progressing   Problem: Clinical Measurements: Goal: Ability to maintain clinical measurements within normal limits will improve Outcome: Progressing Goal: Will remain free from infection Outcome: Progressing Goal: Diagnostic test results will improve Outcome: Progressing Goal: Respiratory complications will improve Outcome: Progressing Goal: Cardiovascular complication will be avoided Outcome: Progressing   Problem: Activity: Goal: Risk for activity intolerance will decrease Outcome: Progressing   Problem: Nutrition: Goal: Adequate nutrition will be maintained Outcome: Progressing   Problem: Coping: Goal: Level of anxiety will decrease Outcome: Progressing   Problem: Elimination: Goal: Will not experience complications related to bowel motility Outcome: Progressing Goal: Will not experience complications related to urinary retention Outcome: Progressing   Problem: Pain Managment: Goal: General experience of comfort will improve Outcome: Progressing   Problem: Safety: Goal: Ability to remain free from injury will improve Outcome: Progressing   Problem: Skin Integrity: Goal: Risk for impaired skin integrity will decrease Outcome: Progressing   Problem: Education: Goal: Ability to describe self-care measures that may prevent or decrease complications (Diabetes Survival Skills Education) will improve Outcome: Progressing Goal: Individualized Educational Video(s) Outcome: Progressing   Problem: Coping: Goal: Ability to adjust to condition or change in health will improve Outcome: Progressing   Problem: Fluid Volume: Goal: Ability to  maintain a balanced intake and output will improve Outcome: Progressing   Problem: Health Behavior/Discharge Planning: Goal: Ability to identify and utilize available resources and services will improve Outcome: Progressing Goal: Ability to manage health-related needs will improve Outcome: Progressing   Problem: Metabolic: Goal: Ability to maintain appropriate glucose levels will improve Outcome: Progressing   Problem: Nutritional: Goal: Maintenance of adequate nutrition will improve Outcome: Progressing Goal: Progress toward achieving an optimal weight will improve Outcome: Progressing   Problem: Skin Integrity: Goal: Risk for impaired skin integrity will decrease Outcome: Progressing   Problem: Tissue Perfusion: Goal: Adequacy of tissue perfusion will improve Outcome: Progressing   Problem: Education: Goal: Knowledge of disease or condition will improve Outcome: Progressing Goal: Knowledge of secondary prevention will improve (SELECT ALL) Outcome: Progressing Goal: Knowledge of patient specific risk factors will improve (INDIVIDUALIZE FOR PATIENT) Outcome: Progressing Goal: Individualized Educational Video(s) Outcome: Progressing   Problem: Coping: Goal: Will verbalize positive feelings about self Outcome: Progressing Goal: Will identify appropriate support needs Outcome: Progressing   Problem: Health Behavior/Discharge Planning: Goal: Ability to manage health-related needs will improve Outcome: Progressing   Problem: Self-Care: Goal: Ability to participate in self-care as condition permits will improve Outcome: Progressing Goal: Verbalization of feelings and concerns over difficulty with self-care will improve Outcome: Progressing Goal: Ability to communicate needs accurately will improve Outcome: Progressing   Problem: Nutrition: Goal: Risk of aspiration will decrease Outcome: Progressing Goal: Dietary intake will improve Outcome: Progressing    Problem: Ischemic Stroke/TIA Tissue Perfusion: Goal: Complications of ischemic stroke/TIA will be minimized Outcome: Progressing   Problem: Education: Goal: Understanding of CV disease, CV risk reduction, and recovery process will improve Outcome: Progressing Goal: Individualized Educational Video(s) Outcome: Progressing   Problem: Activity: Goal: Ability to return to baseline activity level will improve Outcome: Progressing   Problem: Cardiovascular: Goal: Ability to achieve and maintain adequate cardiovascular perfusion will improve Outcome: Progressing Goal: Vascular access site(s) Level 0-1 will be maintained Outcome: Progressing   Problem: Health Behavior/Discharge Planning: Goal: Ability to safely manage   health-related needs after discharge will improve Outcome: Progressing   Problem: Activity: Goal: Ability to tolerate increased activity will improve Outcome: Progressing   

## 2021-10-10 NOTE — Progress Notes (Signed)
   10/10/21 1947  Assess: MEWS Score  BP 127/83  MAP (mmHg) 98  Pulse Rate 83  ECG Heart Rate 83  Resp (!) 38  Level of Consciousness Alert  SpO2 91 %  O2 Device Nasal Cannula  O2 Flow Rate (L/min) 5 L/min  Assess: MEWS Score  MEWS Temp 0  MEWS Systolic 0  MEWS Pulse 0  MEWS RR 3  MEWS LOC 0  MEWS Score 3  MEWS Score Color Yellow  Assess: if the MEWS score is Yellow or Red  Were vital signs taken at a resting state? No  Focused Assessment No change from prior assessment  Does the patient meet 2 or more of the SIRS criteria? No  Does the patient have a confirmed or suspected source of infection? No  Provider and Rapid Response Notified? No  MEWS guidelines implemented *See Row Information* Yes  Treat  Pain Scale Faces  Pain Score 0  Take Vital Signs  Increase Vital Sign Frequency  Yellow: Q 2hr X 2 then Q 4hr X 2, if remains yellow, continue Q 4hrs  Escalate  MEWS: Escalate Yellow: discuss with charge nurse/RN and consider discussing with provider and RRT  Notify: Charge Nurse/RN  Name of Charge Nurse/RN Notified Gladys, RN  Date Charge Nurse/RN Notified 10/10/21  Time Charge Nurse/RN Notified 2053  Document  Patient Outcome Stabilized after interventions  Progress note created (see row info) Yes  Assess: SIRS CRITERIA  SIRS Temperature  0  SIRS Pulse 0  SIRS Respirations  1  SIRS WBC 1  SIRS Score Sum  2

## 2021-10-11 ENCOUNTER — Inpatient Hospital Stay (HOSPITAL_COMMUNITY): Payer: Medicare PPO

## 2021-10-11 LAB — GLUCOSE, CAPILLARY
Glucose-Capillary: 131 mg/dL — ABNORMAL HIGH (ref 70–99)
Glucose-Capillary: 172 mg/dL — ABNORMAL HIGH (ref 70–99)
Glucose-Capillary: 151 mg/dL — ABNORMAL HIGH (ref 70–99)
Glucose-Capillary: 72 mg/dL (ref 70–99)
Glucose-Capillary: 125 mg/dL — ABNORMAL HIGH (ref 70–99)
Glucose-Capillary: 171 mg/dL — ABNORMAL HIGH (ref 70–99)

## 2021-10-11 LAB — BASIC METABOLIC PANEL
Anion gap: 12 (ref 5–15)
BUN: 13 mg/dL (ref 8–23)
CO2: 25 mmol/L (ref 22–32)
Calcium: 9.2 mg/dL (ref 8.9–10.3)
Chloride: 104 mmol/L (ref 98–111)
Creatinine, Ser: 0.62 mg/dL (ref 0.61–1.24)
GFR, Estimated: >60 mL/min (ref >60)
Glucose, Bld: 151 mg/dL — ABNORMAL HIGH (ref 70–99)
Potassium: 3.7 mmol/L (ref 3.5–5.1)
Sodium: 141 mmol/L (ref 135–145)

## 2021-10-11 LAB — CBC
HCT: 39 % (ref 39.0–52.0)
Hemoglobin: 12.4 g/dL — ABNORMAL LOW (ref 13.0–17.0)
MCH: 27.1 pg (ref 26.0–34.0)
MCHC: 31.8 g/dL (ref 30.0–36.0)
MCV: 85.2 fL (ref 80.0–100.0)
Platelets: 346 10*3/uL (ref 150–400)
RBC: 4.58 MIL/uL (ref 4.22–5.81)
RDW: 14.1 % (ref 11.5–15.5)
WBC: 9 10*3/uL (ref 4.0–10.5)
nRBC: 0 % (ref 0.0–0.2)

## 2021-10-11 LAB — MAGNESIUM: Magnesium: 1.7 mg/dL (ref 1.7–2.4)

## 2021-10-11 MED ORDER — POLYETHYLENE GLYCOL 3350 17 G PO PACK
17.0000 g | PACK | Freq: Two times a day (BID) | ORAL | Status: DC | PRN
  Filled 2021-10-11: qty 1

## 2021-10-11 MED ORDER — BISACODYL 10 MG RE SUPP
10.0000 mg | Freq: Every day | RECTAL | Status: DC | PRN
  Filled 2021-10-11 (×2): qty 1

## 2021-10-11 MED ORDER — JEVITY 1.5 CAL/FIBER PO LIQD
237.0000 mL | ORAL | Status: DC
  Administered 2021-10-11 – 2021-10-19 (×48): 237 mL
  Filled 2021-10-11 (×51): qty 237

## 2021-10-11 MED ORDER — SODIUM CHLORIDE 3 % IN NEBU
4.0000 mL | INHALATION_SOLUTION | Freq: Every day | RESPIRATORY_TRACT | Status: AC
  Administered 2021-10-12 – 2021-10-13 (×2): 4 mL via RESPIRATORY_TRACT
  Filled 2021-10-11 (×3): qty 4

## 2021-10-11 NOTE — Plan of Care (Signed)
Problem: Education: Goal: Knowledge of General Education information will improve Description: Including pain rating scale, medication(s)/side effects and non-pharmacologic comfort measures Outcome: Progressing   Problem: Health Behavior/Discharge Planning: Goal: Ability to manage health-related needs will improve Outcome: Progressing   Problem: Clinical Measurements: Goal: Ability to maintain clinical measurements within normal limits will improve Outcome: Progressing Goal: Will remain free from infection Outcome: Progressing Goal: Diagnostic test results will improve Outcome: Progressing Goal: Respiratory complications will improve Outcome: Progressing Goal: Cardiovascular complication will be avoided Outcome: Progressing   Problem: Activity: Goal: Risk for activity intolerance will decrease Outcome: Progressing   Problem: Nutrition: Goal: Adequate nutrition will be maintained Outcome: Progressing   Problem: Coping: Goal: Level of anxiety will decrease Outcome: Progressing   Problem: Elimination: Goal: Will not experience complications related to bowel motility Outcome: Progressing Goal: Will not experience complications related to urinary retention Outcome: Progressing   Problem: Pain Managment: Goal: General experience of comfort will improve Outcome: Progressing   Problem: Safety: Goal: Ability to remain free from injury will improve Outcome: Progressing   Problem: Skin Integrity: Goal: Risk for impaired skin integrity will decrease Outcome: Progressing   Problem: Education: Goal: Ability to describe self-care measures that may prevent or decrease complications (Diabetes Survival Skills Education) will improve Outcome: Progressing Goal: Individualized Educational Video(s) Outcome: Progressing   Problem: Coping: Goal: Ability to adjust to condition or change in health will improve Outcome: Progressing   Problem: Fluid Volume: Goal: Ability to  maintain a balanced intake and output will improve Outcome: Progressing   Problem: Health Behavior/Discharge Planning: Goal: Ability to identify and utilize available resources and services will improve Outcome: Progressing Goal: Ability to manage health-related needs will improve Outcome: Progressing   Problem: Metabolic: Goal: Ability to maintain appropriate glucose levels will improve Outcome: Progressing   Problem: Nutritional: Goal: Maintenance of adequate nutrition will improve Outcome: Progressing Goal: Progress toward achieving an optimal weight will improve Outcome: Progressing   Problem: Skin Integrity: Goal: Risk for impaired skin integrity will decrease Outcome: Progressing   Problem: Tissue Perfusion: Goal: Adequacy of tissue perfusion will improve Outcome: Progressing   Problem: Education: Goal: Knowledge of disease or condition will improve Outcome: Progressing Goal: Knowledge of secondary prevention will improve (SELECT ALL) Outcome: Progressing Goal: Knowledge of patient specific risk factors will improve (INDIVIDUALIZE FOR PATIENT) Outcome: Progressing Goal: Individualized Educational Video(s) Outcome: Progressing   Problem: Coping: Goal: Will verbalize positive feelings about self Outcome: Progressing Goal: Will identify appropriate support needs Outcome: Progressing   Problem: Health Behavior/Discharge Planning: Goal: Ability to manage health-related needs will improve Outcome: Progressing   Problem: Self-Care: Goal: Ability to participate in self-care as condition permits will improve Outcome: Progressing Goal: Verbalization of feelings and concerns over difficulty with self-care will improve Outcome: Progressing Goal: Ability to communicate needs accurately will improve Outcome: Progressing   Problem: Nutrition: Goal: Risk of aspiration will decrease Outcome: Progressing Goal: Dietary intake will improve Outcome: Progressing    Problem: Ischemic Stroke/TIA Tissue Perfusion: Goal: Complications of ischemic stroke/TIA will be minimized Outcome: Progressing   Problem: Education: Goal: Understanding of CV disease, CV risk reduction, and recovery process will improve Outcome: Progressing Goal: Individualized Educational Video(s) Outcome: Progressing   Problem: Activity: Goal: Ability to return to baseline activity level will improve Outcome: Progressing   Problem: Cardiovascular: Goal: Ability to achieve and maintain adequate cardiovascular perfusion will improve Outcome: Progressing Goal: Vascular access site(s) Level 0-1 will be maintained Outcome: Progressing   Problem: Health Behavior/Discharge Planning: Goal: Ability to safely manage   health-related needs after discharge will improve Outcome: Progressing   Problem: Activity: Goal: Ability to tolerate increased activity will improve Outcome: Progressing   

## 2021-10-11 NOTE — Progress Notes (Signed)
Joshua Donovan  CHY:[id number] DOB: 28-Jan-1955 DOA: 08/03/2021 PCP: [facility] [place]    Brief Narrative:  66 year old with a history of ICH, DM 2, HTN, and HLD who presented to [facility] 7/24 with right hand numbness and weakness and was diagnosed with a small brainstem stroke.  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  He required intubation and was transferred to [facility] for an interventional radiology intervention.  He ultimately required tracheostomy and PEG tube placement, and has subsequently suffered a prolonged hospital stay.  Significant events:  7/24 presented to [facility] ventral medulla CVA 7/25 tx to [facility], treated with Cleviprex 7/26 cerebral angiogram with stent placement to right vertebro-basilar junction, failed extubation, MRI brain> mild extension of stroke, now involving bilateral medial medullary, patent basilar artery and R VBJ stent  7/28 bedside tracheostomy by PCCM and PEG tube placement by General Surgery 7/31 Bleeding around trach site, bright red blood.  Underwent bronchoscopy, Trach Removed, patient orally reintubated, stoma packed. 8/2 ENT took patient to OR for trach redo 8/9 transferred to medical floor on tracheostomy 8/12 vomiting after bolus feeding with worsening hypoxia 8/13 copious bleeding from the heparin injection site controlled with pressure dressing 8/14 bleeding has resolved.  Trach secretions blood-tinged. 8/15 insurance denied LTAC appeal.  CSW working on SNF. 8/28: peer to peer for LTAC looks like this was denied 8/29: V. tach runs - increased metoprolol  9/2: Slightly increased work of [place] 99 for chest x-ray showed no interval changes 9/5:  Ancef started 2/2 secretions --no wbc or fever 9/12 completed abx course    Consultants:  Neurology Interventional Radiology PCCM Cardiology  Goals of Care:  Code Status: Full Code   DVT prophylaxis: SCDs  Interim Hx: No  acute events recorded overnight.  Patient did have a modest episode of desaturation this morning requiring changing of his inner cannula and upward titration of his oxygen support.  He was mildly tachypneic at that time.  When I examined the patient his respirations are comfortable and unlabored.  Follow-up chest x-ray later this morning notes no acute findings.  He has no new complaints.  Assessment & Plan:  Acute stroke of medulla oblongata Status post stent to right vertebrobasilar junction 7/26 - persisting quadriplegia - continue aspirin, Brilinta, and Lipitor per Neurology - Neurology signed off 8/4 - will need outpatient Neurology follow-up - continue PT/OT/SLP - seeking SNF placement as insurance denied LTAC  Aspiration pneumonia - hospital-acquired MSSA pneumonia - resolved CXR 9/3 confirmed bibasilar infiltrates - treated with Ancef 9/5 > 9/12 - no clinical evidence of active infection at present  Mild acute hypoxic respiratory failure As evidenced by tachypnea and desaturation to 89% despite 5 L trach collar oxygen support -follow-up CXR today  Tracheostomy dependence Tracheostomy originally placed 8/78 - this was complicated by significant bleeding requiring reintubation and discontinuation of trach - ENT revised tracheostomy 8/2  History of alcohol abuse Continue empiric thiamine, multivitamin, and folic acid  Uncontrolled DM2 with hyperglycemia A1c 8.3 during this hospital stay -CBG well controlled  HTN blood pressure remains controlled  Mildly elevated LFTs - resolved  Now resolved -viral hepatitis panel negative  NSVT - resolved  Responded well to [place] - Cardiology has evaluated   Oropharyngeal dysphagia - PEG tube dependent Due to above - continue tube feeds and free water via tube  Hypokalemia Corrected with supplementation  Hypomagnesemia Corrected with supplementation  Small sacral decubitus POA Continue wound care/RN directed monitoring  Moisture  associted skin damage B buttocks  Continue care as suggested by WOC   Family Communication: No family present at time of exam Disposition: Awaiting SNF placement   Objective: Blood pressure 137/82, pulse 84, temperature 97.6 F (36.4 C), temperature source Oral, resp. rate (!) 25, height '5\' 7"'$  (1.702 m), weight 79.8 kg, SpO2 94 %.  Intake/Output Summary (Last 24 hours) at 10/11/2021 0917 Last data filed at 10/10/2021 2000 Gross per 24 hour  Intake --  Output 1100 ml  Net -1100 ml    Filed Weights   09/22/21 0459 10/02/21 0500 10/04/21 0500  Weight: 85.7 kg 79.8 kg 79.8 kg    Examination: General: No acute respiratory distress -alert and pleasant Lungs: No increased work of breathing/increased respiratory effort Cardiovascular: Regular rate  Abdom: Soft, bowel sounds positive, PEG insertion site clean and dry, no rebound, no mass Ext: No significant edema bilateral lower extremities  CBC: Recent Labs  Lab 10/07/21 0409 10/11/21 0217  WBC 7.4 9.0  HGB 13.2 12.4*  HCT 40.6 39.0  MCV 84.6 85.2  PLT 323 588    Basic Metabolic Panel: Recent Labs  Lab 10/07/21 0409 10/08/21 0325 10/11/21 0217  NA 136 136 141  K 3.7 3.6 3.7  CL 99 100 104  CO2 '25 24 25  '$ GLUCOSE 138* 138* 151*  BUN '13 14 13  '$ CREATININE 0.50* 0.56* 0.62  CALCIUM 9.4 8.9 9.2  MG 1.6* 1.8 1.7  PHOS 4.3  --   --     GFR: Estimated Creatinine Clearance: 92 mL/min (by C-G formula based on SCr of 0.62 mg/dL).   Scheduled Meds:  amLODipine  10 mg Per Tube Daily   aspirin  81 mg Per Tube Daily   atorvastatin  80 mg Per Tube Daily   carvedilol  37.5 mg Per Tube BID WC   diclofenac Sodium  2 g Topical QID   feeding supplement ([name])  237 mL Per Tube Q4H   feeding supplement (PROSource TF20)  60 mL Per Tube Daily   fiber supplement (BANATROL TF)  60 mL Per Tube BID   folic acid  1 mg Per Tube Daily   free water  100 mL Per Tube Q4H   guaiFENesin  10 mL Per Tube Q4H    hydrochlorothiazide  25 mg Per Tube Daily   insulin aspart  0-9 Units Subcutaneous Q4H   insulin detemir  40 Units Subcutaneous BID   multivitamin with minerals  1 tablet Per Tube Daily   mouth rinse  15 mL Mouth Rinse Q2H   polyethylene glycol  17 g Per Tube BID   potassium chloride  40 mEq Per Tube BID   senna-docusate  2 tablet Per Tube BID   thiamine  100 mg Per Tube Daily   ticagrelor  90 mg Per Tube BID     LOS: 79 days   [name], MD [facility] Office  [phone] Pager - Text Page per Shea Evans  If 7PM-7AM, please contact night-coverage per Amion 10/11/2021, 9:17 AM

## 2021-10-11 NOTE — Progress Notes (Signed)
Speech Language Pathology Treatment: Joshua Donovan Speaking valve  Patient Details Name: Joshua Donovan MRN: [id number] DOB: 1955/03/22 Today's Date: 10/11/2021 Time: 1210-1226 SLP Time Calculation (min) (ACUTE ONLY): 16 min  Assessment / Plan / Recommendation Clinical Impression  Session focused on breath support for cough, speech. Pt has decreased awareness of breathing pattern and needs visual and auditory feedback to recognize how to increase inspiration and time with phonation or for EMST. Pt could not automatically achieve airflow through 5 cm H20 on EMST. After 20 repetitions of puffing through a straw to move a paper towel pt was able to achieve airflow in 5/10 reps on EMST. Will continue efforts.   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 He  presented 08/02/21 with R foot pain and R-sided weakness. Transferred to [facility] 7/25. 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 S/p stent placement to the R vertebrobasilar junction 7/26, failed extubation. Trach and PEG placed 7/28. PMH: DM, GERD, TBI with prior ICH, HLD, HTN      SLP Plan  Continue with current plan of care;Other (Comment)      Recommendations for follow up therapy are one component of a multi-disciplinary discharge planning process, led by the attending physician.  Recommendations may be updated based on patient status, additional functional criteria and insurance authorization.    Recommendations         Patient may use Passy-Muir Speech Valve: During all waking hours (remove during sleep)         Follow Up Recommendations: Skilled nursing-short  term rehab (<3 hours/day) Assistance recommended at discharge: Frequent or constant Supervision/Assistance SLP Visit Diagnosis: Dysphagia, oropharyngeal phase (R13.12) Plan: Continue with current plan of care;Other (Comment)           Shameek Nyquist, [name]  10/11/2021, 12:33 PM

## 2021-10-11 NOTE — Progress Notes (Signed)
Brief Nutrition Note  Hospital is out of stock of Jevity 1.5 ready to hang. Recommend transitioning to bolus feeds. Pt will require 6 cartons (1442m) Jevity 1.5/day to meet estimated nutrient needs. Initial bolus schedule of 2361mq 4hrs. Monitor tolerance and condense to q 6hr schedule as tolerated.   Made RN aware of changes.  Nutrition order: 6 cartons Jevity 1.5/day (2133kcal, 91g protein, 108148mree water) Prosource TF20 q day (80kcal, 20g protein) Banatrol BID (90kcal) 100m62mF q 4hrs (600mL68me water) DAILY NUTRIENT TOTAL: 2303kcal, 111g protein, 1681mL 35m water  Katie Candise BowensRD, LDN, CNSC See AMiON for contact information

## 2021-10-11 NOTE — Progress Notes (Signed)
On RT arrival RN was suctioning patient and sats were 88%. Inner cannula changed. BS clear, diminished. Sats around 90%. RT placed patient on 5L/28%. If sats are better next rounds will transition back to room air.

## 2021-10-11 NOTE — Progress Notes (Signed)
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  Symptoms worsened and required transfer to [facility] for neuro IR intervention where he had a stent placed in R vertebrovasilar junction.  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  He had a tracheostomy performed on 7/26 c/b dislodgement and ENT revision on 8/2.  Pertinent  Medical History  TBI with ICH, DM, HTN, HLD  Significant Hospital Events: Including procedures, antibiotic start and stop dates in addition to other pertinent events    7/24 presented to [facility], ventral medulla CVA 7/25 tx to [facility] 7/26 cerebral angiogram with stent placement to right vertebro-basilar junction, failed extubation, left radial aline MRI brain> mild extension of stroke, now involving bilateral medial medullary, patent basilar artery and R VBJ stent  7/28 underwent bedside percutaneous tracheostomy and PEG tube placement, tolerated well 7/29 no acute issues overnight currently on SBT trial this a.m. and tolerating well, Cleviprex resumed earlier this a.m. due to severe hypertension 7/30 Hypertension persist despite adding oral agents, glucose remains elevated as well 7/31 Bleeding around trach site, bright red blood. Copious bloody secretions. Cleviprex off, SBPs 150s-160s. Basal insulin/TF coverage increased. Cefepime deescalated to ceftriaxone. CXR stable. Later in afternoon, continued peritrach bleeding. Bronchoscopic eval completed with intratracheal bleeding associated with trach. Removed, patient orally reintubated, stoma packed. 8/1 ENT consulted for trach revision. Lightly sedated. Vent full support/PRVC, given fatigue following events of yesterday. ENT attempted to  evaluate trach at bedside, could not pass scope. Plan for OR 8/2. ASA/Brilinta held. 8/2 to OR for trach redo 8/6 trach collar during day shift, back on vent overnight 8/10 Hemoptysis, blood with suctioning > improved 8/11 8/14 on 8L / 35% ATC  8/28: Stable vitals, increased tenacious secretions from trach. CXR suggesting new LLL basilar opacities atelectasis vs pna 09/07/2021 Decreased volume and thickness of secretions. No fevers. Tracheal aspirate from 8/28 with mod gram positive cocci and few gram negative rods today, culture pending. 9/5 start cefazolin x 7 days for MSSA pneumonia 9/8 Trach exchange   Interim History / Subjective: Remains on 28% ATC Had O2 desat this am to 87%, but according to nursing he was on 21% at the time. He has been placed on 28%, inner cannula was changed , he was suctioned and sats are now 93%, RR is 34. He does not appear to be in any distress.  CXR 10/2  is clear with mild chronic volume loss at the bases Per nursing secretions are thick, but less in volume than in the past    Objective   Blood pressure 137/82, pulse 84, temperature 97.6 F (36.4 C), temperature source Oral, resp. rate (!) 25, height '5\' 7"'$  (1.702 m), weight 79.8 kg, SpO2 94 %.    FiO2 (%):  [21 %-28 %] 28 %   Intake/Output Summary (Last 24 hours) at 10/11/2021 0943 Last data filed at 10/10/2021 2000 Gross per 24 hour  Intake --  Output 1100 ml  Net -1100 ml   Filed Weights   09/22/21 0459 10/02/21 0500 10/04/21 0500  Weight: 85.7 kg 79.8 kg 79.8 kg    Examination: General: chronically ill appearing man lying in bed in NAD  on ATC HEENT: [place]/AT,  eyes anicteric, No LAD, No JVD Neuro: awake, alert, can mouth words, weak all 4 extremities, following commands CV: S1S2, RRR PULM: Decreased secretions, bilateral chest excursion with scattered rhonchi GI: soft, NT. No erythema around PEG , BS +, Body mass index is 27.57 kg/m.  Extremities: no cyanosis, no obvious deformities Skin:  warm, dry, no diffuse rashes, no lesions   Labs from 10/1 , no leukocytosis, normal electrolytes with exception of Glucose which is 151, HGB is 12.4 which is a 0.8 gram drop in 4 days.    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 MSSA pneumonia -treated with cefazolin Thick secretions , but they have seemed to decrease in volume    Plan: Con't routine trach care Routine Trach care  Continue PMV valve and tube feeds Will add 3% saline x 3 days for thick secretions  OOB to chair with PT as able Not a candidate for decannulation unless he makes remarkable progress with PT  APP Time 20 minutes  Magdalen Spatz, MSN, AGACNP-BC [place] for personal pager PCCM on call pager [phone]  If no response to pager , please call [phone] until 7 pm After 7:00 pm call Elink  [phone]  10/11/21 9:43 AM

## 2021-10-11 NOTE — Progress Notes (Signed)
Physical Therapy Treatment Patient Details Name: Joshua Donovan MRN: [id number] DOB: [birth year] Today's Date: 10/11/2021   History of Present Illness Pt is a 66 y.o. male who presented 08/02/21 with R foot pain and R-sided weakness. Transferred to [facility] 7/25. 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 S/p stent placement to the R vertebrobasilar junction 7/26, failed extubation. Trach and PEG placed 7/28. S/p bronchoscopic evaluation, oral reintubation and tracheostomy removal with stoma packing 7/31 due to trach site bleeding. S/p trach revision 8/2. PMH: DM, GERD, TBI with prior ICH, HLD, HTN    PT Comments    Patient progressing slowly.  Still with extensor synergy kicking in when laying flat.  Patient attempting communication via Joshua Donovan and RN eager for OOB due to CXR with some lower lobe atelectasis.  Patient without coverage for LTACH, so switching recommendations to SNF.  PT will continue to follow acutely.   Recommendations for follow up therapy are one component of a multi-disciplinary discharge planning process, led by the attending physician.  Recommendations may be updated based on patient status, additional functional criteria and insurance authorization.  Follow Up Recommendations  Skilled nursing-short term rehab (<3 hours/day) Can patient physically be transported by private vehicle: No   Assistance Recommended at Discharge Frequent or constant Supervision/Assistance  Patient can return home with the following Assistance with cooking/housework;Two people to help with walking and/or transfers;Two people to help with  bathing/dressing/bathroom;Assistance with feeding;Direct supervision/assist for medications management;Direct supervision/assist for financial management;Assist for transportation;Help with stairs or ramp for entrance   Equipment Recommendations  Other (comment) (defer to post acute)    Recommendations for Other Services       Precautions / Restrictions Precautions Precautions: Fall Precaution Comments: trach collar, PEG, SBP < 180, abdominal binder     Mobility  Bed Mobility Overal bed mobility: Needs Assistance Bed Mobility: Rolling Rolling: Total assist, +2 for physical assistance         General bed mobility comments: totalA to complete rolling for cleaning in bed, and to place lift pad    Transfers                   General transfer comment: use of lift for up to chair Transfer via Lift Equipment: Maximove  Ambulation/Gait                   Stairs             Wheelchair Mobility    Modified Rankin (Stroke Patients Only) Modified Rankin (Stroke Patients Only) Pre-Morbid Rankin Score: No symptoms Modified Rankin: Severe disability     Balance Overall balance assessment: Needs assistance   Sitting balance-Leahy Scale: Zero                                      Cognition Arousal/Alertness: Awake/alert Behavior During Therapy: WFL for tasks assessed/performed Overall Cognitive Status: Impaired/Different from baseline Area of Impairment: Following commands                       Following Commands: Follows one step commands with  increased time       General Comments: PMV in place with mod instructional cueing to try and talk louder as voice is still a whisper        Exercises Other Exercises Other Exercises: PROM UE's & LE's in supine, focus on finger flexion/extension and tendon gliding with multiple restrictions noted    General Comments General comments (skin integrity, edema, etc.): VSS on trach  collar 28% FiO2, RR up to 42 while placing lift pad, but improved to 20's up in chair      Pertinent Vitals/Pain Pain Assessment Faces Pain Scale: Hurts little more Pain Location: grimacing with ROM activities Pain Descriptors / Indicators: Grimacing, Discomfort Pain Intervention(s): Monitored during session    Home Living                          Prior Function            PT Goals (current goals can now be found in the care plan section) Progress towards PT goals: Progressing toward goals    Frequency    Min 2X/week      PT Plan Discharge plan needs to be updated;Frequency needs to be updated    Co-evaluation              AM-PAC PT "6 Clicks" Mobility   Outcome Measure  Help needed turning from your back to your side while in a flat bed without using bedrails?: Total Help needed moving from lying on your back to sitting on the side of a flat bed without using bedrails?: Total Help needed moving to and from a bed to a chair (including a wheelchair)?: Total Help needed standing up from a chair using your arms (e.g., wheelchair or bedside chair)?: Total Help needed to walk in hospital room?: Total Help needed climbing 3-5 steps with a railing? : Total 6 Click Score: 6    End of Session   Activity Tolerance: Patient tolerated treatment well Patient left: in chair;with call bell/phone within reach Nurse Communication: Mobility status;Need for lift equipment PT Visit Diagnosis: Muscle weakness (generalized) (M62.81);Other symptoms and signs involving the nervous system (R29.898);Other abnormalities of gait and mobility (R26.89);Other (comment) Hemiplegia - Right/Left: Right Hemiplegia - dominant/non-dominant: Dominant Hemiplegia - caused by: Cerebral infarction     Time: 1022-1105 PT Time Calculation (min) (ACUTE ONLY): 43 min  Charges:  $Therapeutic Exercise: 8-22 mins $Therapeutic Activity: 23-37 mins                     Joshua Donovan, PT Acute  Rehabilitation Services [phone] 10/11/2021    Joshua Donovan 10/11/2021, 1:38 PM

## 2021-10-11 NOTE — TOC Progression Note (Addendum)
Transition of Care [facility]) - Progression Note    Patient Details  Name: Joshua Donovan MRN: [id number] Date of Birth: 04/01/1955  Transition of Care [facility]) CM/SW [place], [place] Phone Number: 10/11/2021, 3:33 PM  Clinical Narrative:     No bed offers at this time-  [facility]- no answer [place] Left voice message [facility]- unable to offer [facility] [place]- no answer [facility], [place] - faxed referral for review   Expected Discharge Plan: [place] Barriers to Discharge: SNF Pending bed offer  Expected Discharge Plan and Services Expected Discharge Plan: [place]   Discharge Planning Services: CM Consult Post Acute Care Choice: Long Term Acute Care (LTAC) Living arrangements for the past 2 months: Single Family Home                                       Social Determinants of Health (SDOH) Interventions    Readmission Risk Interventions     No data to display

## 2021-10-12 LAB — GLUCOSE, CAPILLARY
Glucose-Capillary: 120 mg/dL — ABNORMAL HIGH (ref 70–99)
Glucose-Capillary: 89 mg/dL (ref 70–99)
Glucose-Capillary: 196 mg/dL — ABNORMAL HIGH (ref 70–99)
Glucose-Capillary: 160 mg/dL — ABNORMAL HIGH (ref 70–99)
Glucose-Capillary: 125 mg/dL — ABNORMAL HIGH (ref 70–99)

## 2021-10-12 NOTE — Progress Notes (Signed)
Speech Language Pathology Treatment: [name] Speaking valve  Patient Details Name: Joshua Donovan MRN: [id number] DOB: [birth year] Today's Date: 10/12/2021 Time: 1015-1030 SLP Time Calculation (min) (ACUTE ONLY): 15 min  Assessment / Plan / Recommendation Clinical Impression  Pt again trying to communicate with rapid phrase level communication with no voice. SLP lead pt through one word per breath strategy with counting to 10 then days of the week, then trying the phrase "How was your weekend?" Pt did have some coordination problems, but did well and was eventually successful with all tasks. SLP able to guide pt through 10 reps of straw puffs to move a tissue, then 5 reps on EMST device set at 5 cm H20. Pt needed max cues and multiple repetitions to be successful with this. Lots of encouragement given about progress. Will continue efforts.   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 He  presented 08/02/21 with R foot pain and R-sided weakness. Transferred to [facility] 7/25. 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 S/p stent placement to the R vertebrobasilar junction 7/26, failed extubation. Trach and PEG placed 7/28. PMH: DM, GERD, TBI with prior ICH, HLD, HTN      SLP Plan  Continue with current plan of care      Recommendations for follow up therapy are one component of a multi-disciplinary discharge planning process, led by the attending physician.  Recommendations may be updated based on patient status, additional functional criteria and insurance authorization.    Recommendations                   Plan:  Continue with current plan of care         Joshua Baltimore, MA CCC-SLP  Acute Rehabilitation Services Secure Chat Preferred Office [phone]   [name]  10/12/2021, 11:12 AM

## 2021-10-12 NOTE — Progress Notes (Signed)
Joshua Donovan  CBJ:[id number] DOB: [birth year] DOA: 08/03/2021 PCP: [facility] [place]    Brief Narrative:  66 year old with a history of ICH, DM 2, HTN, and HLD who presented to [facility] 7/24 with right hand numbness and weakness and was diagnosed with a small brainstem stroke.  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  He required intubation and was transferred to [facility] for an interventional radiology intervention.  He ultimately required tracheostomy and PEG tube placement, and has subsequently suffered a prolonged hospital stay.  Significant events:  7/24 presented to [facility], ventral medulla CVA 7/25 tx to [facility], treated with Cleviprex 7/26 cerebral angiogram with stent placement to right vertebro-basilar junction, failed extubation, MRI brain> mild extension of stroke, now involving bilateral medial medullary, patent basilar artery and R VBJ stent  7/28 bedside tracheostomy by PCCM and PEG tube placement by General Surgery 7/31 Bleeding around trach site, bright red blood.  Underwent bronchoscopy, Trach Removed, patient orally reintubated, stoma packed. 8/2 ENT took patient to OR for trach redo 8/9 transferred to medical floor on tracheostomy 8/12 vomiting after bolus feeding with worsening hypoxia 8/13 copious bleeding from the heparin injection site controlled with pressure dressing 8/14 bleeding has resolved.  Trach secretions blood-tinged. 8/15 insurance denied LTAC appeal.  CSW working on SNF. 8/28: peer to peer for LTAC looks like this was denied 8/29: V. tach runs - increased metoprolol  9/2: Slightly increased work of [place] [address] 99 for chest x-ray showed no interval changes 9/5:  Ancef started 2/2 secretions --no wbc or fever 9/12 completed abx course    Consultants:  Neurology Interventional Radiology PCCM Cardiology  Goals of Care:  Code Status: Full Code   DVT prophylaxis: SCDs  Interim Hx: No  current options for disposition.  Vital signs stable.  Some intermittent tachypnea persists.  Saturations stable.  Appears comfortable.  Assessment & Plan:  Acute stroke of medulla oblongata Status post stent to right vertebrobasilar junction 7/26 - persisting quadriplegia - continue aspirin, Brilinta, and Lipitor per Neurology - Neurology signed off 8/4 - will need outpatient Neurology follow-up - continue PT/OT/SLP - seeking SNF placement as insurance denied LTAC  Aspiration pneumonia - hospital-acquired MSSA pneumonia - resolved CXR 9/3 confirmed bibasilar infiltrates - treated with Ancef 9/5 > 9/12 - no clinical evidence of active infection at present  Mild acute hypoxic respiratory failure As evidenced by tachypnea and desaturation to 89% despite 5 L trach collar oxygen support -follow-up CXR 10/2 without acute findings -some intermittent tachypnea persists but patient does not appear to be in significant distress -continue consistent trach care -monitor clinically  Tracheostomy dependence Tracheostomy originally placed 1/60 - this was complicated by significant bleeding requiring reintubation and discontinuation of trach - ENT revised tracheostomy 8/2  History of alcohol abuse Continue empiric thiamine, multivitamin, and folic acid  Uncontrolled DM2 with hyperglycemia A1c 8.3 during this hospital stay - CBG remains well controlled  HTN blood pressure controlled  Mildly elevated LFTs - resolved  Now resolved -viral hepatitis panel negative  NSVT - resolved  Responded well to [facility] - Cardiology has evaluated   Oropharyngeal dysphagia - PEG tube dependent Due to above - continue tube feeds and free water via tube  Hypokalemia - resolved Corrected with supplementation  Hypomagnesemia - resolved Corrected with supplementation  Small sacral decubitus POA Continue wound care/RN directed monitoring  Moisture associted skin damage B buttocks  Continue care as suggested by WOC    Family Communication: No  family present at time of exam Disposition: Awaiting SNF placement but it is unlikely this will happen anytime soon   Objective: Blood pressure 137/83, pulse 86, temperature 98.5 F (36.9 C), temperature source Oral, resp. rate (!) 26, height '5\' 7"'$  (1.702 m), weight 79.8 kg, SpO2 95 %.  Intake/Output Summary (Last 24 hours) at 10/12/2021 0921 Last data filed at 10/11/2021 2000 Gross per 24 hour  Intake --  Output 0 ml  Net 0 ml    Filed Weights   09/22/21 0459 10/02/21 0500 10/04/21 0500  Weight: 85.7 kg 79.8 kg 79.8 kg    Examination: General: No acute respiratory distress  Lungs: Good air movement bilateral fields Cardiovascular: Regular rate without murmur Abdom: Soft, bowel sounds positive, PEG insertion site clean and dry Ext: No signif edema bilateral lower extremities  CBC: Recent Labs  Lab 10/07/21 0409 10/11/21 0217  WBC 7.4 9.0  HGB 13.2 12.4*  HCT 40.6 39.0  MCV 84.6 85.2  PLT 323 814    Basic Metabolic Panel: Recent Labs  Lab 10/07/21 0409 10/08/21 0325 10/11/21 0217  NA 136 136 141  K 3.7 3.6 3.7  CL 99 100 104  CO2 '25 24 25  '$ GLUCOSE 138* 138* 151*  BUN '13 14 13  '$ CREATININE 0.50* 0.56* 0.62  CALCIUM 9.4 8.9 9.2  MG 1.6* 1.8 1.7  PHOS 4.3  --   --     GFR: Estimated Creatinine Clearance: 92 mL/min (by C-G formula based on SCr of 0.62 mg/dL).   Scheduled Meds:  amLODipine  10 mg Per Tube Daily   aspirin  81 mg Per Tube Daily   atorvastatin  80 mg Per Tube Daily   carvedilol  37.5 mg Per Tube BID WC   diclofenac Sodium  2 g Topical QID   feeding supplement ([name])  237 mL Per Tube Q4H   feeding supplement (PROSource TF20)  60 mL Per Tube Daily   fiber supplement (BANATROL TF)  60 mL Per Tube BID   folic acid  1 mg Per Tube Daily   free water  100 mL Per Tube Q4H   guaiFENesin  10 mL Per Tube Q4H   hydrochlorothiazide  25 mg Per Tube Daily   insulin aspart  0-9 Units Subcutaneous Q4H    insulin detemir  40 Units Subcutaneous BID   multivitamin with minerals  1 tablet Per Tube Daily   mouth rinse  15 mL Mouth Rinse Q2H   potassium chloride  40 mEq Per Tube BID   senna-docusate  2 tablet Per Tube BID   sodium chloride HYPERTONIC  4 mL Nebulization Daily   thiamine  100 mg Per Tube Daily   ticagrelor  90 mg Per Tube BID     LOS: 70 days   [name], MD [facility] Office  [phone] Pager - Text Page per Shea Evans  If 7PM-7AM, please contact night-coverage per Amion 10/12/2021, 9:21 AM

## 2021-10-12 NOTE — Progress Notes (Signed)
   10/11/21 1922  Assess: MEWS Score  Temp 98.7 F (37.1 C)  BP 136/84  MAP (mmHg) 100  Pulse Rate 83  ECG Heart Rate 84  Resp (!) 36  SpO2 (!) 27 %  Assess: MEWS Score  MEWS Temp 0  MEWS Systolic 0  MEWS Pulse 0  MEWS RR 3  MEWS LOC 0  MEWS Score 3  MEWS Score Color Yellow  Assess: if the MEWS score is Yellow or Red  Were vital signs taken at a resting state? No  Focused Assessment No change from prior assessment  Does the patient meet 2 or more of the SIRS criteria? No  Does the patient have a confirmed or suspected source of infection? No  Provider and Rapid Response Notified? No  MEWS guidelines implemented *See Row Information* Yes  Treat  MEWS Interventions Administered scheduled meds/treatments  Pain Scale Faces  Pain Score 0  Take Vital Signs  Increase Vital Sign Frequency  Yellow: Q 2hr X 2 then Q 4hr X 2, if remains yellow, continue Q 4hrs  Escalate  MEWS: Escalate Yellow: discuss with charge nurse/RN and consider discussing with provider and RRT  Notify: Charge Nurse/RN  Name of Charge Nurse/RN Notified Gladys, RN  Date Charge Nurse/RN Notified 10/11/21  Time Charge Nurse/RN Notified 1949  Document  Patient Outcome Stabilized after interventions  Progress note created (see row info) Yes  Assess: SIRS CRITERIA  SIRS Temperature  0  SIRS Pulse 0  SIRS Respirations  1  SIRS WBC 1  SIRS Score Sum  2

## 2021-10-13 LAB — GLUCOSE, CAPILLARY
Glucose-Capillary: 98 mg/dL (ref 70–99)
Glucose-Capillary: 105 mg/dL — ABNORMAL HIGH (ref 70–99)
Glucose-Capillary: 166 mg/dL — ABNORMAL HIGH (ref 70–99)
Glucose-Capillary: 115 mg/dL — ABNORMAL HIGH (ref 70–99)
Glucose-Capillary: 163 mg/dL — ABNORMAL HIGH (ref 70–99)
Glucose-Capillary: 224 mg/dL — ABNORMAL HIGH (ref 70–99)

## 2021-10-13 NOTE — Progress Notes (Signed)
Joshua Donovan  HAL:[id number] DOB: 1955-06-08 DOA: 08/03/2021 PCP: [facility] [place]    Brief Narrative:  66 year old with a history of ICH, DM 2, HTN, and HLD who presented to [facility] 7/24 with right hand numbness and weakness and was diagnosed with a small brainstem stroke.  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  He required intubation and was transferred to [facility] for an interventional radiology intervention.  He ultimately required tracheostomy and PEG tube placement, and has subsequently suffered a prolonged hospital stay.  Significant events:  7/24 presented to [facility], ventral medulla CVA 7/25 tx to [facility], treated with Cleviprex 7/26 cerebral angiogram with stent placement to right vertebro-basilar junction, failed extubation, MRI brain> mild extension of stroke, now involving bilateral medial medullary, patent basilar artery and R VBJ stent  7/28 bedside tracheostomy by PCCM and PEG tube placement by General Surgery 7/31 Bleeding around trach site, bright red blood.  Underwent bronchoscopy, Trach Removed, patient orally reintubated, stoma packed. 8/2 ENT took patient to OR for trach redo 8/9 transferred to medical floor on tracheostomy 8/12 vomiting after bolus feeding with worsening hypoxia 8/13 copious bleeding from the heparin injection site controlled with pressure dressing 8/14 bleeding has resolved.  Trach secretions blood-tinged. 8/15 insurance denied LTAC appeal.  CSW working on SNF. 8/28: peer to peer for LTAC looks like this was denied 8/29: V. tach runs - increased metoprolol  9/2: Slightly increased work of breathing-low-grade temps 99 for chest x-ray showed no interval changes 9/5:  Ancef started 2/2 secretions --no wbc or fever 9/12 completed abx course  Awaiting placement   Consultants:  Neurology Interventional Radiology PCCM Cardiology  Goals of Care:  Code Status: Full Code   DVT  prophylaxis: SCDs  Interim Hx: No complaints  Assessment & Plan:  Acute stroke of medulla oblongata Status post stent to right vertebrobasilar junction 7/26 - persisting quadriplegia - continue aspirin, Brilinta, and Lipitor per Neurology - Neurology signed off 8/4 - will need outpatient Neurology follow-up - continue PT/OT/SLP - seeking SNF placement as insurance denied LTAC  Aspiration pneumonia - hospital-acquired MSSA pneumonia - resolved CXR 9/3 confirmed bibasilar infiltrates - treated with Ancef 9/5 > 9/12 - no clinical evidence of active infection at present  Mild acute hypoxic respiratory failure As evidenced by tachypnea and desaturation to 89% despite 5 L trach collar oxygen support -follow-up CXR 10/2 without acute findings  Tracheostomy dependence Tracheostomy originally placed 7/35 - this was complicated by significant bleeding requiring reintubation and discontinuation of trach - ENT revised tracheostomy 8/2  History of alcohol abuse Continue empiric thiamine, multivitamin, and folic acid  Uncontrolled DM2 with hyperglycemia A1c 8.3 during this hospital stay - CBG remains well controlled  HTN blood pressure controlled  Mildly elevated LFTs - resolved  Now resolved -viral hepatitis panel negative  NSVT - resolved  Responded well to [place] - Cardiology has evaluated   Oropharyngeal dysphagia - PEG tube dependent Due to above - continue tube feeds and free water via tube  Hypokalemia - resolved Corrected with supplementation  Hypomagnesemia - resolved Corrected with supplementation  Small sacral decubitus POA Continue wound care/RN directed monitoring   Moisture associted skin damage B buttocks  Continue care as suggested by WOC   Family Communication: No family present at time of exam Disposition: Awaiting SNF placement but it is unlikely this will happen anytime soon   Objective: Blood pressure (!) 146/90, pulse 93, temperature 98.2 F (36.8 C),  temperature source  Oral, resp. rate (!) 26, height '5\' 7"'$  (1.702 m), weight 79.8 kg, SpO2 94 %.  Intake/Output Summary (Last 24 hours) at 10/13/2021 0953 Last data filed at 10/12/2021 1910 Gross per 24 hour  Intake 0 ml  Output 900 ml  Net -900 ml   Filed Weights   09/22/21 0459 10/02/21 0500 10/04/21 0500  Weight: 85.7 kg 79.8 kg 79.8 kg    Examination:  General: Appearance:     Overweight male in no acute distress     Lungs:     Trach in place, diminished, respirations unlabored  Heart:    Normal heart rate.   MS:   All extremities are intact.   Neurologic:   Awake, alert     CBC: Recent Labs  Lab 10/07/21 0409 10/11/21 0217  WBC 7.4 9.0  HGB 13.2 12.4*  HCT 40.6 39.0  MCV 84.6 85.2  PLT 323 562   Basic Metabolic Panel: Recent Labs  Lab 10/07/21 0409 10/08/21 0325 10/11/21 0217  NA 136 136 141  K 3.7 3.6 3.7  CL 99 100 104  CO2 '25 24 25  '$ GLUCOSE 138* 138* 151*  BUN '13 14 13  '$ CREATININE 0.50* 0.56* 0.62  CALCIUM 9.4 8.9 9.2  MG 1.6* 1.8 1.7  PHOS 4.3  --   --    GFR: Estimated Creatinine Clearance: 92 mL/min (by C-G formula based on SCr of 0.62 mg/dL).   Scheduled Meds:  amLODipine  10 mg Per Tube Daily   aspirin  81 mg Per Tube Daily   atorvastatin  80 mg Per Tube Daily   carvedilol  37.5 mg Per Tube BID WC   diclofenac Sodium  2 g Topical QID   feeding supplement ([name])  237 mL Per Tube Q4H   feeding supplement (PROSource TF20)  60 mL Per Tube Daily   fiber supplement (BANATROL TF)  60 mL Per Tube BID   folic acid  1 mg Per Tube Daily   free water  100 mL Per Tube Q4H   guaiFENesin  10 mL Per Tube Q4H   hydrochlorothiazide  25 mg Per Tube Daily   insulin aspart  0-9 Units Subcutaneous Q4H   insulin detemir  40 Units Subcutaneous BID   multivitamin with minerals  1 tablet Per Tube Daily   mouth rinse  15 mL Mouth Rinse Q2H   potassium chloride  40 mEq Per Tube BID   sodium chloride HYPERTONIC  4 mL Nebulization Daily    thiamine  100 mg Per Tube Daily   ticagrelor  90 mg Per Tube BID     LOS: 71 days   [name] DO  If 7PM-7AM, please contact night-coverage per Amion 10/13/2021, 9:53 AM

## 2021-10-14 LAB — GLUCOSE, CAPILLARY
Glucose-Capillary: 106 mg/dL — ABNORMAL HIGH (ref 70–99)
Glucose-Capillary: 134 mg/dL — ABNORMAL HIGH (ref 70–99)
Glucose-Capillary: 145 mg/dL — ABNORMAL HIGH (ref 70–99)
Glucose-Capillary: 150 mg/dL — ABNORMAL HIGH (ref 70–99)
Glucose-Capillary: 210 mg/dL — ABNORMAL HIGH (ref 70–99)
Glucose-Capillary: 82 mg/dL (ref 70–99)
Glucose-Capillary: 85 mg/dL (ref 70–99)

## 2021-10-14 MED ORDER — FUROSEMIDE 10 MG/ML IJ SOLN
20.0000 mg | Freq: Once | INTRAMUSCULAR | Status: AC
  Administered 2021-10-14: 20 mg via INTRAVENOUS
  Filled 2021-10-14: qty 2

## 2021-10-14 NOTE — Progress Notes (Signed)
Physical Therapy Treatment Patient Details Name: Joshua Donovan MRN: [id number] DOB: 1955/09/19 Today's Date: 10/14/2021   History of Present Illness Pt is a 66 y.o. male who presented 08/02/21 with R foot pain and R-sided weakness. Transferred to [facility] 7/25. 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 S/p stent placement to the R vertebrobasilar junction 7/26, failed extubation. Trach and PEG placed 7/28. S/p bronchoscopic evaluation, oral reintubation and tracheostomy removal with stoma packing 7/31 due to trach site bleeding. S/p trach revision 8/2. PMH: DM, GERD, TBI with prior ICH, HLD, HTN    PT Comments    Progressing slowly, though today able to tolerate 10 min at EOB working on head movements, utilizing core musculature to phonate to communicate names of his family in photos.  He has been utilizing sip-n-puff call bell and demonstrated x 4 while up in chair.  Patient will continue to benefit from skilled PT in the acute setting.  Feel follow up STSNF level rehab remains appropriate.   Goals updated this session.   Recommendations for follow up therapy are one component of a multi-disciplinary discharge planning process, led by the attending physician.  Recommendations may be updated based on patient status, additional functional criteria and insurance authorization.  Follow Up Recommendations  Skilled nursing-short term rehab (<3 hours/day) Can patient physically be transported by private vehicle: No   Assistance Recommended at Discharge Frequent or constant Supervision/Assistance  Patient can return home with the following  Assistance with cooking/housework;Two people to help with walking and/or transfers;Two people to help with bathing/dressing/bathroom;Assistance with feeding;Direct supervision/assist for medications management;Direct supervision/assist for financial management;Assist for transportation;Help with stairs or ramp for entrance   Equipment Recommendations  Other (comment) (TBA)    Recommendations for Other Services       Precautions / Restrictions Precautions Precautions: Fall Precaution Comments: trach collar, PEG, SBP < 180, abdominal binder     Mobility  Bed Mobility Overal bed mobility: Needs Assistance Bed Mobility: Rolling Rolling: Total assist, +2 for physical assistance Sidelying to sit: Total assist, +2 for physical assistance       General bed mobility comments: assist to roll for hygiene to clean BM, then rolled for side to sit, cues for pt to initiate with head turn prior to rolling.    Transfers Overall transfer level: Needs assistance   Transfers: Bed to chair/wheelchair/BSC       Squat pivot transfers: Total assist, +2 safety/equipment    Lateral/Scoot Transfers: Total assist, +2 safety/equipment General transfer comment: scooting to EOB then to tilt in space w/c dependent transfer    Ambulation/Gait                   Stairs             Wheelchair Mobility    Modified Rankin (Stroke Patients Only) Modified Rankin (Stroke Patients Only) Pre-Morbid Rankin Score: No symptoms Modified Rankin: Severe disability     Balance Overall balance assessment: Needs assistance Sitting-balance support: Feet supported Sitting balance-Leahy Scale: Zero Sitting balance - Comments: on EOB x 10 minutes working on head rotation, head elevation (accompanied often with full body extension) and on communication for Joshua Donovan to recite the names of  members in photos on his closet door.  Patient able to phonate very softly with cues for annunciation as well.                                     Cognition Arousal/Alertness: Awake/alert Behavior During Therapy: WFL for tasks assessed/performed Overall Cognitive Status: Impaired/Different from baseline Area of Impairment: Memory                     Memory: Decreased short-term memory         General Comments: needs reminders for communication via [place] comments (skin integrity, edema, etc.): on TC at 28% FiO2 wearing PMSV throughout clearing secretions x 1 with cough while in supine and assist to replace valve x 2      Pertinent Vitals/Pain Pain Assessment Faces Pain Scale: Hurts little more Pain Location: grimaced with L LE flexion Pain Descriptors / Indicators: Grimacing, Discomfort Pain Intervention(s): Monitored during session    Home Living                          Prior Function            PT Goals (current goals can now be found in the care plan section) Acute Rehab PT Goals PT Goal Formulation: With patient/family Time For Goal Achievement: 10/28/21 Potential to Achieve Goals: Fair Additional Goals Additional Goal #1: Patient to demonstrate active head control for lifting with minimal extensor tone activation. Progress towards PT goals: Progressing toward goals (goals extended)    Frequency    Min 2X/week      PT Plan Current plan remains appropriate    Co-evaluation PT/OT/SLP Co-Evaluation/Treatment: Yes Reason for Co-Treatment: Complexity of the patient's impairments (multi-system involvement);For patient/therapist safety;To address functional/ADL transfers PT goals addressed during session: Mobility/safety with mobility;Balance        AM-PAC PT "6 Clicks" Mobility   Outcome Measure  Help needed turning from your back to your side while in a flat bed without using bedrails?: Total Help needed moving from lying on your back to sitting on the side of a flat bed without using  bedrails?: Total Help needed moving to and from a bed to a chair (including a wheelchair)?: Total Help needed standing up from a chair using your arms (e.g., wheelchair or bedside chair)?: Total Help needed to walk in hospital room?: Total Help needed climbing 3-5 steps with a railing? : Total 6 Click Score: 6    End of Session   Activity Tolerance: Patient tolerated treatment well Patient left: in chair;with call bell/phone within reach (sip n puff call bell adjusted to allow pt to access on R side of his mouth) Nurse Communication: Need for lift equipment;Mobility status PT Visit Diagnosis: Muscle weakness (generalized) (M62.81);Other symptoms and signs involving the nervous system (R29.898);Other abnormalities of gait and mobility (R26.89);Other (comment) Hemiplegia - dominant/non-dominant: Dominant Hemiplegia - caused by: Cerebral infarction     Time: 1320-1400 PT Time Calculation (min) (ACUTE ONLY): 40 min  Charges:  $Therapeutic Activity: 8-22 mins                     [name], PT Acute Rehabilitation Services Office:[phone] 10/14/2021    [name] 10/14/2021, 2:38 PM

## 2021-10-14 NOTE — Progress Notes (Signed)
Occupational Therapy Treatment Patient Details Name: Joshua Donovan MRN: [id number] DOB: January 11, 1956 Today's Date: 10/14/2021   History of present illness Pt is a 66 y.o. male who presented 08/02/21 with R foot pain and R-sided weakness. Transferred to [facility] 7/25. 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 S/p stent placement to the R vertebrobasilar junction 7/26, failed extubation. Trach and PEG placed 7/28. S/p bronchoscopic evaluation, oral reintubation and tracheostomy removal with stoma packing 7/31 due to trach site bleeding. S/p trach revision 8/2. PMH: DM, GERD, TBI with prior ICH, HLD, HTN   OT comments  Pt currently still making slow progress with OT at this time.  She still needs total assist for rolling with total +2 for toileting in bed, sidelying to sit, and for squat pivot transfers to the bed.  Currently, he is able to demonstrate some active cervical movements in rotation and extension but still severely limited.  Recommend continued acute care rehab at this time to increase independence with sitting balance, head control, and general strengthening in preparation for future ADL tasks.    Recommendations for follow up therapy are one component of a multi-disciplinary discharge planning process, led by the attending physician.  Recommendations may be updated based on patient status, additional functional criteria and insurance authorization.    Follow Up Recommendations  OT at Long-term acute care hospital    Assistance Recommended at Discharge Frequent or constant Supervision/Assistance  Patient can return home with the  following  Two people to help with walking and/or transfers;Two people to help with bathing/dressing/bathroom;Assistance with cooking/housework;Assistance with feeding;Direct supervision/assist for medications management;Direct supervision/assist for financial management;Assist for transportation;Help with stairs or ramp for entrance   Equipment Recommendations  Other (comment)       Precautions / Restrictions Precautions Precautions: Fall Precaution Comments: trach collar, PEG, SBP < 180, abdominal binder, Restrictions Weight Bearing Restrictions: No       Mobility Bed Mobility Overal bed mobility: Needs Assistance Bed Mobility: Rolling Rolling: Total assist, +2 for physical assistance Sidelying to sit: Total assist, +2 for physical assistance Supine to sit: Total assist, +2 for physical assistance, +2 for safety/equipment Sit to supine: Total assist, +2 for physical assistance   General bed mobility comments: Total assist for all aspects of rolling and transiton to sitting    Transfers Overall transfer level: Needs assistance   Transfers: Bed to chair/wheelchair/BSC     Squat pivot transfers: Total assist, +2 safety/equipment             Balance Overall balance assessment: Needs assistance Sitting-balance support: Feet supported Sitting balance-Leahy Scale: Zero Sitting balance - Comments: total assist to maintain sitting balance EOB while working on cervical extension and head movements.                                   ADL either performed or assessed with clinical judgement   ADL Overall ADL's : Needs assistance/impaired                             Toileting- Clothing Manipulation and Hygiene: Total  assistance;Bed level;+2 for safety/equipment Toileting - Clothing Manipulation Details (indicate cue type and reason): cleaning up bowel incontinence rolling in the bed     Functional mobility during ADLs: Total assistance;+2 for  physical assistance (squat pivot from bed to tilt in space wheelchair) General ADL Comments: Pt worked on sitting balance and head control EOB for 10 mins.  Still total assist for sitting balance with mod assist for head control.  He can help to extend and rotate head to both sides but is developing moderate extensor patterns in his arms, trunk, and LEs when initiating some of these movements.  He was able to demonstrate use of the sip and puff call button system after being transferred over to the tilt in space wheelchair and positioned correctly.               Cognition Arousal/Alertness: Awake/alert Behavior During Therapy: WFL for tasks assessed/performed Overall Cognitive Status: Impaired/Different from baseline                         Following Commands: Follows one step commands with increased time       General Comments: Overall feel like he attempts to follow one step commands consistently but exhibits significant motor impairments which limit his ability to successfully complete them.              General Comments on TC at 28% FiO2 wearing PMSV throughout clearing secretions x 1 with cough while in supine and assist to replace valve x 2            Frequency  Min 2X/week        Progress Toward Goals  OT Goals(current goals can now be found in the care plan section)  Progress towards OT goals: Progressing toward goals  Acute Rehab OT Goals Patient Stated Goal: Pt did not state but agreeable to working in therapy. OT Goal Formulation: With patient Time For Goal Achievement: 10/20/21 Potential to Achieve Goals: Good  Plan Discharge plan remains appropriate    Co-evaluation      Reason for Co-Treatment: Complexity of the patient's impairments (multi-system involvement);For patient/therapist safety;To address functional/ADL transfers PT goals addressed during session: Mobility/safety with mobility;Balance        AM-PAC OT "6 Clicks" Daily  Activity     Outcome Measure   Help from another person eating meals?: Total Help from another person taking care of personal grooming?: Total Help from another person toileting, which includes using toliet, bedpan, or urinal?: Total Help from another person bathing (including washing, rinsing, drying)?: Total Help from another person to put on and taking off regular upper body clothing?: Total Help from another person to put on and taking off regular lower body clothing?: Total 6 Click Score: 6    End of Session Equipment Utilized During Treatment: Oxygen  OT Visit Diagnosis: Other symptoms and signs involving the nervous system (R29.898);Muscle weakness (generalized) (M62.81);Feeding difficulties (R63.3);Other abnormalities of gait and mobility (R26.89);Unsteadiness on feet (R26.81);Other (comment) (quadriparesis)   Activity Tolerance Patient tolerated treatment well   Patient Left with call bell/phone within reach;in chair   Nurse Communication Mobility status;Need for lift equipment        Time: 1320-1403 OT Time Calculation (min): 43 min  Charges: OT General Charges $OT Visit: 1 Visit OT Treatments $Self Care/Home Management : 23-37 mins  Breslyn Abdo OTR/L 10/14/2021, 3:56 PM

## 2021-10-14 NOTE — TOC Progression Note (Signed)
Transition of Care [facility]) - Progression Note    Patient Details  Name: Joshua Donovan MRN: [id number] Date of Birth: 29-May-1955  Transition of Care [facility]) CM/SW [place], [place] Phone Number: 10/14/2021, 11:46 AM  Clinical Narrative:     No bed offers at this time-   TOC continues to follow   Expected Discharge Plan: [place] Barriers to Discharge: SNF Pending bed offer  Expected Discharge Plan and Services Expected Discharge Plan: [place]   Discharge Planning Services: CM Consult Post Acute Care Choice: Long Term Acute Care (LTAC) Living arrangements for the past 2 months: Single Family Home                                       Social Determinants of Health (SDOH) Interventions    Readmission Risk Interventions     No data to display

## 2021-10-14 NOTE — Progress Notes (Signed)
Speech Language Pathology Treatment: [name] Speaking valve  Patient Details Name: Joshua Donovan MRN: [id number] DOB: [birth year] Today's Date: 10/14/2021 Time: 1240-1300 SLP Time Calculation (min) (ACUTE ONLY): 20 min  Assessment / Plan / Recommendation Clinical Impression  Guided pt through breathing exercises and ENST with [name] at bedside. Pt able to count intelligibly 1-5 with voice and self corrections as needed. Pt not able to carry over to phrases to communicate independently. Easily frustrated by effort and trying to talk too quickly. New PMSV given as pt felt that something was not working with the prior one, which was washed and stored on shelf. Will continue efforts.    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 He  presented 08/02/21 with R foot pain and R-sided weakness. Transferred to [facility] 7/25. 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 S/p stent placement to the R vertebrobasilar junction 7/26, failed extubation. Trach and PEG placed 7/28. PMH: DM, GERD, TBI with prior ICH, HLD, HTN      SLP Plan  Continue with current plan of care      Recommendations for follow up therapy are one component of a multi-disciplinary discharge planning process, led by the attending physician.  Recommendations may be updated based on patient status, additional functional criteria and insurance authorization.    Recommendations         Patient may use Passy-Muir Speech Valve: During all waking hours (remove during sleep) PMSV Supervision: Intermittent MD: Please consider changing trach tube to : Smaller size          Plan: Continue with current plan of care           Keijuan Schellhase, [name]  10/14/2021, 2:41 PM

## 2021-10-14 NOTE — Progress Notes (Signed)
Joshua Donovan  OIZ:[id number] DOB: [birth year] DOA: 08/03/2021 PCP: [facility] [place]    Brief Narrative:  66 year old with a history of ICH, DM 2, HTN, and HLD who presented to [facility] 7/24 with right hand numbness and weakness and was diagnosed with a small brainstem stroke.  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  He required intubation and was transferred to [facility] for an interventional radiology intervention.  He ultimately required tracheostomy and PEG tube placement, and has subsequently suffered a prolonged hospital stay.  Significant events:  7/24 presented to [facility], ventral medulla CVA 7/25 tx to [facility], treated with Cleviprex 7/26 cerebral angiogram with stent placement to right vertebro-basilar junction, failed extubation, MRI brain> mild extension of stroke, now involving bilateral medial medullary, patent basilar artery and R VBJ stent  7/28 bedside tracheostomy by PCCM and PEG tube placement by General Surgery 7/31 Bleeding around trach site, bright red blood.  Underwent bronchoscopy, Trach Removed, patient orally reintubated, stoma packed. 8/2 ENT took patient to OR for trach redo 8/9 transferred to medical floor on tracheostomy 8/12 vomiting after bolus feeding with worsening hypoxia 8/13 copious bleeding from the heparin injection site controlled with pressure dressing 8/14 bleeding has resolved.  Trach secretions blood-tinged. 8/15 insurance denied LTAC appeal.  CSW working on SNF. 8/28: peer to peer for LTAC looks like this was denied 8/29: V. tach runs - increased metoprolol  9/2: Slightly increased work of breathing-low-grade temps 99 for chest x-ray showed no interval changes 9/5:  Ancef started 2/2 secretions --no wbc or fever 9/12 completed abx course  Awaiting placement   Consultants:  Neurology Interventional Radiology PCCM Cardiology  Goals of Care:  Code Status: Full Code   DVT  prophylaxis: SCDs  Interim Hx: Nursing reports lots of secretions   Assessment & Plan:  Acute stroke of medulla oblongata Status post stent to right vertebrobasilar junction 7/26 - persisting quadriplegia - continue aspirin, Brilinta, and Lipitor per Neurology - Neurology signed off 8/4 - will need outpatient Neurology follow-up - continue PT/OT/SLP - seeking SNF placement as insurance denied LTAC  Aspiration pneumonia - hospital-acquired MSSA pneumonia - resolved CXR 9/3 confirmed bibasilar infiltrates - treated with Ancef 9/5 > 9/12 - no clinical evidence of active infection at present  Mild acute hypoxic respiratory failure As evidenced by tachypnea and desaturation to 89% despite 5 L trach collar oxygen support -follow-up CXR 10/2 without acute findings -trial of lasix IV x1  Tracheostomy dependence Tracheostomy originally placed 3/38 - this was complicated by significant bleeding requiring reintubation and discontinuation of trach - ENT revised tracheostomy 8/2  History of alcohol abuse Continue empiric thiamine, multivitamin, and folic acid  Uncontrolled DM2 with hyperglycemia A1c 8.3 during this hospital stay - CBG remains well controlled  HTN blood pressure controlled  Mildly elevated LFTs - resolved  Now resolved -viral hepatitis panel negative  NSVT - resolved  Responded well to [place] - Cardiology has evaluated   Oropharyngeal dysphagia - PEG tube dependent Due to above - continue tube feeds and free water via tube  Hypokalemia - resolved Corrected with supplementation  Hypomagnesemia - resolved Corrected with supplementation  Small sacral decubitus POA Continue wound care/RN directed monitoring   Moisture associted skin damage B buttocks  Continue care as suggested by WOC   Family Communication: No family present at time of exam Disposition: Awaiting SNF placement but it is unlikely this will happen anytime soon   Objective: Blood pressure 120/70,  pulse  80, temperature 98.8 F (37.1 C), temperature source Oral, resp. rate (!) 28, height '5\' 7"'$  (1.702 m), weight 79.8 kg, SpO2 97 %.  Intake/Output Summary (Last 24 hours) at 10/14/2021 1154 Last data filed at 10/14/2021 0500 Gross per 24 hour  Intake --  Output 1900 ml  Net -1900 ml   Filed Weights   09/22/21 0459 10/02/21 0500 10/04/21 0500  Weight: 85.7 kg 79.8 kg 79.8 kg    Examination:   General: Appearance:     Overweight male in no acute distress     Lungs:     respirations unlabored  Heart:    Normal heart rate.   MS:   All extremities are intact.   Neurologic:   Awake, alert, oriented x 3. No apparent focal neurological           defect.        CBC: Recent Labs  Lab 10/11/21 0217  WBC 9.0  HGB 12.4*  HCT 39.0  MCV 85.2  PLT 564   Basic Metabolic Panel: Recent Labs  Lab 10/08/21 0325 10/11/21 0217  NA 136 141  K 3.6 3.7  CL 100 104  CO2 24 25  GLUCOSE 138* 151*  BUN 14 13  CREATININE 0.56* 0.62  CALCIUM 8.9 9.2  MG 1.8 1.7   GFR: Estimated Creatinine Clearance: 92 mL/min (by C-G formula based on SCr of 0.62 mg/dL).   Scheduled Meds:  amLODipine  10 mg Per Tube Daily   aspirin  81 mg Per Tube Daily   atorvastatin  80 mg Per Tube Daily   carvedilol  37.5 mg Per Tube BID WC   diclofenac Sodium  2 g Topical QID   feeding supplement ([name])  237 mL Per Tube Q4H   feeding supplement (PROSource TF20)  60 mL Per Tube Daily   fiber supplement (BANATROL TF)  60 mL Per Tube BID   folic acid  1 mg Per Tube Daily   free water  100 mL Per Tube Q4H   furosemide  20 mg Intravenous Once   guaiFENesin  10 mL Per Tube Q4H   hydrochlorothiazide  25 mg Per Tube Daily   insulin aspart  0-9 Units Subcutaneous Q4H   insulin detemir  40 Units Subcutaneous BID   multivitamin with minerals  1 tablet Per Tube Daily   mouth rinse  15 mL Mouth Rinse Q2H   potassium chloride  40 mEq Per Tube BID   thiamine  100 mg Per Tube Daily   ticagrelor  90 mg  Per Tube BID     LOS: 72 days   [name] DO  If 7PM-7AM, please contact night-coverage per Amion 10/14/2021, 11:54 AM

## 2021-10-15 LAB — GLUCOSE, CAPILLARY
Glucose-Capillary: 139 mg/dL — ABNORMAL HIGH (ref 70–99)
Glucose-Capillary: 111 mg/dL — ABNORMAL HIGH (ref 70–99)
Glucose-Capillary: 246 mg/dL — ABNORMAL HIGH (ref 70–99)
Glucose-Capillary: 209 mg/dL — ABNORMAL HIGH (ref 70–99)
Glucose-Capillary: 191 mg/dL — ABNORMAL HIGH (ref 70–99)
Glucose-Capillary: 170 mg/dL — ABNORMAL HIGH (ref 70–99)

## 2021-10-15 NOTE — Plan of Care (Signed)
Problem: Education: Goal: Knowledge of General Education information will improve Description: Including pain rating scale, medication(s)/side effects and non-pharmacologic comfort measures Outcome: Progressing   Problem: Health Behavior/Discharge Planning: Goal: Ability to manage health-related needs will improve Outcome: Progressing   Problem: Clinical Measurements: Goal: Ability to maintain clinical measurements within normal limits will improve Outcome: Progressing Goal: Will remain free from infection Outcome: Progressing Goal: Diagnostic test results will improve Outcome: Progressing Goal: Respiratory complications will improve Outcome: Progressing Goal: Cardiovascular complication will be avoided Outcome: Progressing   Problem: Activity: Goal: Risk for activity intolerance will decrease Outcome: Progressing   Problem: Nutrition: Goal: Adequate nutrition will be maintained Outcome: Progressing   Problem: Coping: Goal: Level of anxiety will decrease Outcome: Progressing   Problem: Elimination: Goal: Will not experience complications related to bowel motility Outcome: Progressing Goal: Will not experience complications related to urinary retention Outcome: Progressing   Problem: Pain Managment: Goal: General experience of comfort will improve Outcome: Progressing   Problem: Safety: Goal: Ability to remain free from injury will improve Outcome: Progressing   Problem: Skin Integrity: Goal: Risk for impaired skin integrity will decrease Outcome: Progressing   Problem: Education: Goal: Ability to describe self-care measures that may prevent or decrease complications (Diabetes Survival Skills Education) will improve Outcome: Progressing Goal: Individualized Educational Video(s) Outcome: Progressing   Problem: Coping: Goal: Ability to adjust to condition or change in health will improve Outcome: Progressing   Problem: Fluid Volume: Goal: Ability to  maintain a balanced intake and output will improve Outcome: Progressing   Problem: Health Behavior/Discharge Planning: Goal: Ability to identify and utilize available resources and services will improve Outcome: Progressing Goal: Ability to manage health-related needs will improve Outcome: Progressing   Problem: Metabolic: Goal: Ability to maintain appropriate glucose levels will improve Outcome: Progressing   Problem: Nutritional: Goal: Maintenance of adequate nutrition will improve Outcome: Progressing Goal: Progress toward achieving an optimal weight will improve Outcome: Progressing   Problem: Skin Integrity: Goal: Risk for impaired skin integrity will decrease Outcome: Progressing   Problem: Tissue Perfusion: Goal: Adequacy of tissue perfusion will improve Outcome: Progressing   Problem: Education: Goal: Knowledge of disease or condition will improve Outcome: Progressing Goal: Knowledge of secondary prevention will improve (SELECT ALL) Outcome: Progressing Goal: Knowledge of patient specific risk factors will improve (INDIVIDUALIZE FOR PATIENT) Outcome: Progressing Goal: Individualized Educational Video(s) Outcome: Progressing   Problem: Coping: Goal: Will verbalize positive feelings about self Outcome: Progressing Goal: Will identify appropriate support needs Outcome: Progressing   Problem: Health Behavior/Discharge Planning: Goal: Ability to manage health-related needs will improve Outcome: Progressing   Problem: Self-Care: Goal: Ability to participate in self-care as condition permits will improve Outcome: Progressing Goal: Verbalization of feelings and concerns over difficulty with self-care will improve Outcome: Progressing Goal: Ability to communicate needs accurately will improve Outcome: Progressing   Problem: Nutrition: Goal: Risk of aspiration will decrease Outcome: Progressing Goal: Dietary intake will improve Outcome: Progressing    Problem: Ischemic Stroke/TIA Tissue Perfusion: Goal: Complications of ischemic stroke/TIA will be minimized Outcome: Progressing   Problem: Education: Goal: Understanding of CV disease, CV risk reduction, and recovery process will improve Outcome: Progressing Goal: Individualized Educational Video(s) Outcome: Progressing   Problem: Activity: Goal: Ability to return to baseline activity level will improve Outcome: Progressing   Problem: Cardiovascular: Goal: Ability to achieve and maintain adequate cardiovascular perfusion will improve Outcome: Progressing Goal: Vascular access site(s) Level 0-1 will be maintained Outcome: Progressing   Problem: Health Behavior/Discharge Planning: Goal: Ability to safely manage   health-related needs after discharge will improve Outcome: Progressing   Problem: Activity: Goal: Ability to tolerate increased activity will improve Outcome: Progressing   

## 2021-10-15 NOTE — Progress Notes (Signed)
Joshua Donovan  WEX:[id number] DOB: 17-Jan-1955 DOA: 08/03/2021 PCP: [facility] [place]    Brief Narrative:  66 year old with a history of ICH, DM 2, HTN, and HLD who presented to [facility] 7/24 with right hand numbness and weakness and was diagnosed with a small brainstem stroke.  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  He required intubation and was transferred to [facility] for an interventional radiology intervention.  He ultimately required tracheostomy and PEG tube placement, and has subsequently suffered a prolonged hospital stay.  Significant events:  7/24 presented to [facility], ventral medulla CVA 7/25 tx to [facility], treated with Cleviprex 7/26 cerebral angiogram with stent placement to right vertebro-basilar junction, failed extubation, MRI brain> mild extension of stroke, now involving bilateral medial medullary, patent basilar artery and R VBJ stent  7/28 bedside tracheostomy by PCCM and PEG tube placement by General Surgery 7/31 Bleeding around trach site, bright red blood.  Underwent bronchoscopy, Trach Removed, patient orally reintubated, stoma packed. 8/2 ENT took patient to OR for trach redo 8/9 transferred to medical floor on tracheostomy 8/12 vomiting after bolus feeding with worsening hypoxia 8/13 copious bleeding from the heparin injection site controlled with pressure dressing 8/14 bleeding has resolved.  Trach secretions blood-tinged. 8/15 insurance denied LTAC appeal.  CSW working on SNF. 8/28: peer to peer for LTAC looks like this was denied 8/29: V. tach runs - increased metoprolol  9/2: Slightly increased work of breathing-low-grade temps 99 for chest x-ray showed no interval changes 9/5:  Ancef started 2/2 secretions --no wbc or fever 9/12 completed abx course  Awaiting placement   Consultants:  Neurology Interventional Radiology PCCM Cardiology  Goals of Care:  Code Status: Full Code   DVT  prophylaxis: SCDs  Interim Hx: No overnight events -discussed with PCCM downsizing of trach-- would not currently recommend  Assessment & Plan:  Acute stroke of medulla oblongata Status post stent to right vertebrobasilar junction 7/26 - persisting quadriplegia - continue aspirin, Brilinta, and Lipitor per Neurology - Neurology signed off 8/4 - will need outpatient Neurology follow-up - continue PT/OT/SLP - seeking SNF placement as insurance denied LTAC  Aspiration pneumonia - hospital-acquired MSSA pneumonia - resolved CXR 9/3 confirmed bibasilar infiltrates - treated with Ancef 9/5 > 9/12 - no clinical evidence of active infection at present  Mild acute hypoxic respiratory failure As evidenced by tachypnea and desaturation to 89% despite 5 L trach collar oxygen support -follow-up CXR 10/2 without acute findings -trial of lasix IV x1  Tracheostomy dependence Tracheostomy originally placed 9/38 - this was complicated by significant bleeding requiring reintubation and discontinuation of trach - ENT revised tracheostomy 8/2  History of alcohol abuse Continue empiric thiamine, multivitamin, and folic acid  Uncontrolled DM2 with hyperglycemia A1c 8.3 during this hospital stay - CBG remains well controlled  HTN blood pressure controlled  Mildly elevated LFTs - resolved  Now resolved -viral hepatitis panel negative  NSVT - resolved  Responded well to [place] - Cardiology has evaluated   Oropharyngeal dysphagia - PEG tube dependent Due to above - continue tube feeds and free water via tube  Hypokalemia - resolved Corrected with supplementation  Hypomagnesemia - resolved Corrected with supplementation  Small sacral decubitus POA Continue wound care/RN directed monitoring   Moisture associted skin damage B buttocks  Continue care as suggested by WOC   Family Communication: No family present at time of exam Disposition: Awaiting SNF placement but it is unlikely this will  happen anytime soon  Objective: Blood pressure 116/71, pulse 87, temperature 98.5 F (36.9 C), temperature source Oral, resp. rate 20, height '5\' 7"'$  (1.702 m), weight 79.8 kg, SpO2 96 %.  Intake/Output Summary (Last 24 hours) at 10/15/2021 1210 Last data filed at 10/14/2021 1500 Gross per 24 hour  Intake --  Output 1515 ml  Net -1515 ml   Filed Weights   09/22/21 0459 10/02/21 0500 10/04/21 0500  Weight: 85.7 kg 79.8 kg 79.8 kg    Examination:   General: Appearance:     Overweight male in no acute distress     Lungs:      respirations unlabored  Heart:    Normal heart rate.    MS:   All extremities are intact.   Neurologic:   Awake, alert         CBC: Recent Labs  Lab 10/11/21 0217  WBC 9.0  HGB 12.4*  HCT 39.0  MCV 85.2  PLT 254   Basic Metabolic Panel: Recent Labs  Lab 10/11/21 0217  NA 141  K 3.7  CL 104  CO2 25  GLUCOSE 151*  BUN 13  CREATININE 0.62  CALCIUM 9.2  MG 1.7   GFR: Estimated Creatinine Clearance: 92 mL/min (by C-G formula based on SCr of 0.62 mg/dL).   Scheduled Meds:  amLODipine  10 mg Per Tube Daily   aspirin  81 mg Per Tube Daily   atorvastatin  80 mg Per Tube Daily   carvedilol  37.5 mg Per Tube BID WC   diclofenac Sodium  2 g Topical QID   feeding supplement ([name])  237 mL Per Tube Q4H   feeding supplement (PROSource TF20)  60 mL Per Tube Daily   fiber supplement (BANATROL TF)  60 mL Per Tube BID   folic acid  1 mg Per Tube Daily   free water  100 mL Per Tube Q4H   guaiFENesin  10 mL Per Tube Q4H   hydrochlorothiazide  25 mg Per Tube Daily   insulin aspart  0-9 Units Subcutaneous Q4H   insulin detemir  40 Units Subcutaneous BID   multivitamin with minerals  1 tablet Per Tube Daily   mouth rinse  15 mL Mouth Rinse Q2H   potassium chloride  40 mEq Per Tube BID   thiamine  100 mg Per Tube Daily   ticagrelor  90 mg Per Tube BID     LOS: 73 days   [name] DO  If 7PM-7AM, please contact  night-coverage per Amion 10/15/2021, 12:10 PM

## 2021-10-16 LAB — GLUCOSE, CAPILLARY
Glucose-Capillary: 108 mg/dL — ABNORMAL HIGH (ref 70–99)
Glucose-Capillary: 116 mg/dL — ABNORMAL HIGH (ref 70–99)
Glucose-Capillary: 222 mg/dL — ABNORMAL HIGH (ref 70–99)
Glucose-Capillary: 203 mg/dL — ABNORMAL HIGH (ref 70–99)
Glucose-Capillary: 184 mg/dL — ABNORMAL HIGH (ref 70–99)

## 2021-10-16 NOTE — Plan of Care (Signed)
Problem: Education: Goal: Knowledge of General Education information will improve Description: Including pain rating scale, medication(s)/side effects and non-pharmacologic comfort measures Outcome: Progressing   Problem: Health Behavior/Discharge Planning: Goal: Ability to manage health-related needs will improve Outcome: Progressing   Problem: Clinical Measurements: Goal: Ability to maintain clinical measurements within normal limits will improve Outcome: Progressing Goal: Will remain free from infection Outcome: Progressing Goal: Diagnostic test results will improve Outcome: Progressing Goal: Respiratory complications will improve Outcome: Progressing Goal: Cardiovascular complication will be avoided Outcome: Progressing   Problem: Activity: Goal: Risk for activity intolerance will decrease Outcome: Progressing   Problem: Nutrition: Goal: Adequate nutrition will be maintained Outcome: Progressing   Problem: Coping: Goal: Level of anxiety will decrease Outcome: Progressing   Problem: Elimination: Goal: Will not experience complications related to bowel motility Outcome: Progressing Goal: Will not experience complications related to urinary retention Outcome: Progressing   Problem: Pain Managment: Goal: General experience of comfort will improve Outcome: Progressing   Problem: Safety: Goal: Ability to remain free from injury will improve Outcome: Progressing   Problem: Skin Integrity: Goal: Risk for impaired skin integrity will decrease Outcome: Progressing   Problem: Education: Goal: Ability to describe self-care measures that may prevent or decrease complications (Diabetes Survival Skills Education) will improve Outcome: Progressing Goal: Individualized Educational Video(s) Outcome: Progressing   Problem: Coping: Goal: Ability to adjust to condition or change in health will improve Outcome: Progressing   Problem: Fluid Volume: Goal: Ability to  maintain a balanced intake and output will improve Outcome: Progressing   Problem: Health Behavior/Discharge Planning: Goal: Ability to identify and utilize available resources and services will improve Outcome: Progressing Goal: Ability to manage health-related needs will improve Outcome: Progressing   Problem: Metabolic: Goal: Ability to maintain appropriate glucose levels will improve Outcome: Progressing   Problem: Nutritional: Goal: Maintenance of adequate nutrition will improve Outcome: Progressing Goal: Progress toward achieving an optimal weight will improve Outcome: Progressing   Problem: Skin Integrity: Goal: Risk for impaired skin integrity will decrease Outcome: Progressing   Problem: Tissue Perfusion: Goal: Adequacy of tissue perfusion will improve Outcome: Progressing   Problem: Education: Goal: Knowledge of disease or condition will improve Outcome: Progressing Goal: Knowledge of secondary prevention will improve (SELECT ALL) Outcome: Progressing Goal: Knowledge of patient specific risk factors will improve (INDIVIDUALIZE FOR PATIENT) Outcome: Progressing Goal: Individualized Educational Video(s) Outcome: Progressing   Problem: Coping: Goal: Will verbalize positive feelings about self Outcome: Progressing Goal: Will identify appropriate support needs Outcome: Progressing   Problem: Health Behavior/Discharge Planning: Goal: Ability to manage health-related needs will improve Outcome: Progressing   Problem: Self-Care: Goal: Ability to participate in self-care as condition permits will improve Outcome: Progressing Goal: Verbalization of feelings and concerns over difficulty with self-care will improve Outcome: Progressing Goal: Ability to communicate needs accurately will improve Outcome: Progressing   Problem: Nutrition: Goal: Risk of aspiration will decrease Outcome: Progressing Goal: Dietary intake will improve Outcome: Progressing    Problem: Ischemic Stroke/TIA Tissue Perfusion: Goal: Complications of ischemic stroke/TIA will be minimized Outcome: Progressing   Problem: Education: Goal: Understanding of CV disease, CV risk reduction, and recovery process will improve Outcome: Progressing Goal: Individualized Educational Video(s) Outcome: Progressing   Problem: Activity: Goal: Ability to return to baseline activity level will improve Outcome: Progressing   Problem: Cardiovascular: Goal: Ability to achieve and maintain adequate cardiovascular perfusion will improve Outcome: Progressing Goal: Vascular access site(s) Level 0-1 will be maintained Outcome: Progressing   Problem: Health Behavior/Discharge Planning: Goal: Ability to safely manage   health-related needs after discharge will improve Outcome: Progressing   Problem: Activity: Goal: Ability to tolerate increased activity will improve Outcome: Progressing   

## 2021-10-16 NOTE — Progress Notes (Signed)
Joshua Donovan  OZH:[id number] DOB: March 10, 1955 DOA: 08/03/2021 PCP: [facility] [place]    Brief Narrative:  66 year old with a history of ICH, DM 2, HTN, and HLD who presented to [facility] 7/24 with right hand numbness and weakness and was diagnosed with a small brainstem stroke.  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  He required intubation and was transferred to [facility] for an interventional radiology intervention.  He ultimately required tracheostomy and PEG tube placement, and has subsequently suffered a prolonged hospital stay.  Significant events:  7/24 presented to [facility], ventral medulla CVA 7/25 tx to [facility], treated with Cleviprex 7/26 cerebral angiogram with stent placement to right vertebro-basilar junction, failed extubation, MRI brain> mild extension of stroke, now involving bilateral medial medullary, patent basilar artery and R VBJ stent  7/28 bedside tracheostomy by PCCM and PEG tube placement by General Surgery 7/31 Bleeding around trach site, bright red blood.  Underwent bronchoscopy, Trach Removed, patient orally reintubated, stoma packed. 8/2 ENT took patient to OR for trach redo 8/9 transferred to medical floor on tracheostomy 8/12 vomiting after bolus feeding with worsening hypoxia 8/13 copious bleeding from the heparin injection site controlled with pressure dressing 8/14 bleeding has resolved.  Trach secretions blood-tinged. 8/15 insurance denied LTAC appeal.  CSW working on SNF. 8/28: peer to peer for LTAC looks like this was denied 8/29: V. tach runs - increased metoprolol  9/2: Slightly increased work of breathing-low-grade temps 99 for chest x-ray showed no interval changes 9/5:  Ancef started 2/2 secretions --no wbc or fever 9/12 completed abx course  Awaiting placement   Consultants:  Neurology Interventional Radiology PCCM Cardiology  Goals of Care:  Code Status: Full Code   DVT  prophylaxis: SCDs  Interim Hx: resting -discussed with PCCM downsizing of trach-- would not currently recommend  Assessment & Plan:  Acute stroke of medulla oblongata Status post stent to right vertebrobasilar junction 7/26 - persisting quadriplegia - continue aspirin, Brilinta, and Lipitor per Neurology - Neurology signed off 8/4 - will need outpatient Neurology follow-up - continue PT/OT/SLP - seeking SNF placement as insurance denied LTAC  Aspiration pneumonia - hospital-acquired MSSA pneumonia - resolved CXR 9/3 confirmed bibasilar infiltrates - treated with Ancef 9/5 > 9/12 - no clinical evidence of active infection at present  Mild acute hypoxic respiratory failure As evidenced by tachypnea and desaturation to 89% despite 5 L trach collar oxygen support -follow-up CXR 10/2 without acute findings -trial of lasix IV x1  Tracheostomy dependence Tracheostomy originally placed 6/29 - this was complicated by significant bleeding requiring reintubation and discontinuation of trach - ENT revised tracheostomy 8/2  History of alcohol abuse Continue empiric thiamine, multivitamin, and folic acid  Uncontrolled DM2 with hyperglycemia A1c 8.3 during this hospital stay - CBG remains well controlled  HTN blood pressure controlled  Mildly elevated LFTs - resolved  Now resolved -viral hepatitis panel negative  NSVT - resolved  Responded well to [place] - Cardiology has evaluated   Oropharyngeal dysphagia - PEG tube dependent Due to above - continue tube feeds and free water via tube  Hypokalemia - resolved Corrected with supplementation  Hypomagnesemia - resolved Corrected with supplementation  Small sacral decubitus POA Continue wound care/RN directed monitoring   Moisture associted skin damage B buttocks  Continue care as suggested by WOC   Family Communication: No family present at time of exam Disposition: Awaiting SNF placement but it is unlikely this will happen anytime  soon  Objective: Blood pressure (!) 134/91, pulse 89, temperature 98.8 F (37.1 C), temperature source Axillary, resp. rate (!) 22, height '5\' 7"'$  (1.702 m), weight 79.8 kg, SpO2 97 %.  Intake/Output Summary (Last 24 hours) at 10/16/2021 1133 Last data filed at 10/16/2021 0934 Gross per 24 hour  Intake --  Output 4600 ml  Net -4600 ml   Filed Weights   09/22/21 0459 10/02/21 0500 10/04/21 0500  Weight: 85.7 kg 79.8 kg 79.8 kg    Examination:  In bed, resting with eyes closed  CBC: Recent Labs  Lab 10/11/21 0217  WBC 9.0  HGB 12.4*  HCT 39.0  MCV 85.2  PLT 275   Basic Metabolic Panel: Recent Labs  Lab 10/11/21 0217  NA 141  K 3.7  CL 104  CO2 25  GLUCOSE 151*  BUN 13  CREATININE 0.62  CALCIUM 9.2  MG 1.7   GFR: Estimated Creatinine Clearance: 92 mL/min (by C-G formula based on SCr of 0.62 mg/dL).   Scheduled Meds:  amLODipine  10 mg Per Tube Daily   aspirin  81 mg Per Tube Daily   atorvastatin  80 mg Per Tube Daily   carvedilol  37.5 mg Per Tube BID WC   diclofenac Sodium  2 g Topical QID   feeding supplement ([name])  237 mL Per Tube Q4H   feeding supplement (PROSource TF20)  60 mL Per Tube Daily   fiber supplement (BANATROL TF)  60 mL Per Tube BID   folic acid  1 mg Per Tube Daily   free water  100 mL Per Tube Q4H   guaiFENesin  10 mL Per Tube Q4H   hydrochlorothiazide  25 mg Per Tube Daily   insulin aspart  0-9 Units Subcutaneous Q4H   insulin detemir  40 Units Subcutaneous BID   multivitamin with minerals  1 tablet Per Tube Daily   mouth rinse  15 mL Mouth Rinse Q2H   potassium chloride  40 mEq Per Tube BID   thiamine  100 mg Per Tube Daily   ticagrelor  90 mg Per Tube BID     LOS: 74 days   [name] DO  If 7PM-7AM, please contact night-coverage per Amion 10/16/2021, 11:33 AM

## 2021-10-17 ENCOUNTER — Inpatient Hospital Stay (HOSPITAL_COMMUNITY): Payer: Medicare PPO

## 2021-10-17 LAB — GLUCOSE, CAPILLARY
Glucose-Capillary: 109 mg/dL — ABNORMAL HIGH (ref 70–99)
Glucose-Capillary: 115 mg/dL — ABNORMAL HIGH (ref 70–99)
Glucose-Capillary: 222 mg/dL — ABNORMAL HIGH (ref 70–99)
Glucose-Capillary: 125 mg/dL — ABNORMAL HIGH (ref 70–99)
Glucose-Capillary: 132 mg/dL — ABNORMAL HIGH (ref 70–99)
Glucose-Capillary: 202 mg/dL — ABNORMAL HIGH (ref 70–99)

## 2021-10-17 NOTE — Progress Notes (Signed)
Joshua Donovan  DUK:[id number] DOB: 1955-05-17 DOA: 08/03/2021 PCP: [facility] [place]    Brief Narrative:  66 year old with a history of ICH, DM 2, HTN, and HLD who presented to [facility] 7/24 with right hand numbness and weakness and was diagnosed with a small brainstem stroke.  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  He required intubation and was transferred to [facility] for an interventional radiology intervention.  He ultimately required tracheostomy and PEG tube placement, and has subsequently suffered a prolonged hospital stay.  Significant events:  7/24 presented to [facility], ventral medulla CVA 7/25 tx to [facility], treated with Cleviprex 7/26 cerebral angiogram with stent placement to right vertebro-basilar junction, failed extubation, MRI brain> mild extension of stroke, now involving bilateral medial medullary, patent basilar artery and R VBJ stent  7/28 bedside tracheostomy by PCCM and PEG tube placement by General Surgery 7/31 Bleeding around trach site, bright red blood.  Underwent bronchoscopy, Trach Removed, patient orally reintubated, stoma packed. 8/2 ENT took patient to OR for trach redo 8/9 transferred to medical floor on tracheostomy 8/12 vomiting after bolus feeding with worsening hypoxia 8/13 copious bleeding from the heparin injection site controlled with pressure dressing 8/14 bleeding has resolved.  Trach secretions blood-tinged. 8/15 insurance denied LTAC appeal.  CSW working on SNF. 8/28: peer to peer for LTAC looks like this was denied 8/29: V. tach runs - increased metoprolol  9/2: Slightly increased work of breathing-low-grade temps 99 for chest x-ray showed no interval changes 9/5:  Ancef started 2/2 secretions --no wbc or fever 9/12 completed abx course  Awaiting placement   Consultants:  Neurology Interventional Radiology PCCM Cardiology  Goals of Care:  Code Status: Full Code   DVT  prophylaxis: SCDs  Interim Hx: Lots of secretions today  Assessment & Plan:  Acute stroke of medulla oblongata Status post stent to right vertebrobasilar junction 7/26 - persisting quadriplegia - continue aspirin, Brilinta, and Lipitor per Neurology - Neurology signed off 8/4 - will need outpatient Neurology follow-up - continue PT/OT/SLP - seeking SNF placement as insurance denied LTAC  Aspiration pneumonia - hospital-acquired MSSA pneumonia - resolved CXR 9/3 confirmed bibasilar infiltrates - treated with Ancef 9/5 > 9/12 - no clinical evidence of active infection at present -recheck x ray 10/8  Mild acute hypoxic respiratory failure As evidenced by tachypnea and desaturation to 89% despite 5 L trach collar oxygen support -follow-up CXR 10/2 without acute findings -trial of lasix IV x1  Tracheostomy dependence Tracheostomy originally placed 3/76 - this was complicated by significant bleeding requiring reintubation and discontinuation of trach - ENT revised tracheostomy 8/2  History of alcohol abuse Continue empiric thiamine, multivitamin, and folic acid  Uncontrolled DM2 with hyperglycemia A1c 8.3 during this hospital stay - CBG remains well controlled  HTN blood pressure controlled  Mildly elevated LFTs - resolved  Now resolved -viral hepatitis panel negative  NSVT - resolved  Responded well to [place] - Cardiology has evaluated   Oropharyngeal dysphagia - PEG tube dependent Due to above - continue tube feeds and free water via tube  Hypokalemia - resolved Corrected with supplementation  Hypomagnesemia - resolved Corrected with supplementation  Small sacral decubitus POA Continue wound care/RN directed monitoring   Moisture associted skin damage B buttocks  Continue care as suggested by WOC   Family Communication: family at bedside Disposition: Awaiting SNF placement but it is unlikely this will happen anytime soon   Objective: Blood pressure (!) 159/95,  pulse 99,  temperature 99.1 F (37.3 C), temperature source Axillary, resp. rate (!) 35, height '5\' 7"'$  (1.702 m), weight 79.8 kg, SpO2 97 %.  Intake/Output Summary (Last 24 hours) at 10/17/2021 1258 Last data filed at 10/17/2021 8101 Gross per 24 hour  Intake 720 ml  Output 2500 ml  Net -1780 ml   Filed Weights   09/22/21 0459 10/02/21 0500 10/04/21 0500  Weight: 85.7 kg 79.8 kg 79.8 kg    Examination:  General: Appearance:     Overweight male in no acute distress     Lungs:     respirations slightly labored, lots of secretions   Heart:    Normal heart rate.   MS:   All extremities are intact.   Neurologic:   Awake, alert,     CBC: Recent Labs  Lab 10/11/21 0217  WBC 9.0  HGB 12.4*  HCT 39.0  MCV 85.2  PLT 751   Basic Metabolic Panel: Recent Labs  Lab 10/11/21 0217  NA 141  K 3.7  CL 104  CO2 25  GLUCOSE 151*  BUN 13  CREATININE 0.62  CALCIUM 9.2  MG 1.7   GFR: Estimated Creatinine Clearance: 92 mL/min (by C-G formula based on SCr of 0.62 mg/dL).   Scheduled Meds:  amLODipine  10 mg Per Tube Daily   aspirin  81 mg Per Tube Daily   atorvastatin  80 mg Per Tube Daily   carvedilol  37.5 mg Per Tube BID WC   diclofenac Sodium  2 g Topical QID   feeding supplement ([name])  237 mL Per Tube Q4H   feeding supplement (PROSource TF20)  60 mL Per Tube Daily   fiber supplement (BANATROL TF)  60 mL Per Tube BID   folic acid  1 mg Per Tube Daily   free water  100 mL Per Tube Q4H   guaiFENesin  10 mL Per Tube Q4H   hydrochlorothiazide  25 mg Per Tube Daily   insulin aspart  0-9 Units Subcutaneous Q4H   insulin detemir  40 Units Subcutaneous BID   multivitamin with minerals  1 tablet Per Tube Daily   mouth rinse  15 mL Mouth Rinse Q2H   potassium chloride  40 mEq Per Tube BID   thiamine  100 mg Per Tube Daily   ticagrelor  90 mg Per Tube BID     LOS: 75 days   [name] DO  If 7PM-7AM, please contact night-coverage per Amion 10/17/2021,  12:58 PM

## 2021-10-18 LAB — GLUCOSE, CAPILLARY
Glucose-Capillary: 224 mg/dL — ABNORMAL HIGH (ref 70–99)
Glucose-Capillary: 107 mg/dL — ABNORMAL HIGH (ref 70–99)
Glucose-Capillary: 179 mg/dL — ABNORMAL HIGH (ref 70–99)
Glucose-Capillary: 179 mg/dL — ABNORMAL HIGH (ref 70–99)
Glucose-Capillary: 114 mg/dL — ABNORMAL HIGH (ref 70–99)

## 2021-10-18 MED ORDER — FUROSEMIDE 10 MG/ML IJ SOLN
20.0000 mg | Freq: Once | INTRAMUSCULAR | Status: AC
  Administered 2021-10-18: 20 mg via INTRAVENOUS
  Filled 2021-10-18: qty 2

## 2021-10-18 NOTE — TOC Progression Note (Addendum)
Transition of Care [facility]) - Progression Note    Patient Details  Name: Joshua Donovan MRN: [id number] Date of Birth: Nov 07, 1955  Transition of Care [facility]) CM/SW [place], [place] Phone Number: 10/18/2021, 4:18 PM  Clinical Narrative:     [facility] in [place],  [place] states they can offer but we need MD to schedule nebulizer treatment for every 6 hrs    Received call back form patient's daughter, Joelene Millin- informed of bed offer with [facility] located in [place]. She expressed concerns with the patient being located so faraway from family but states she will discuss with family and call CSW back this week.   TOC continues to follow and assist with discharge planning.   Thurmond Butts, MSW, LCSW Clinical Social Worker    Expected Discharge Plan: Skilled Nursing Facility Barriers to Discharge: SNF Pending bed offer  Expected Discharge Plan and Services Expected Discharge Plan: [place]   Discharge Planning Services: CM Consult Post Acute Care Choice: Long Term Acute Care (LTAC) Living arrangements for the past 2 months: Single Family Home                                       Social Determinants of Health (SDOH) Interventions    Readmission Risk Interventions     No data to display

## 2021-10-18 NOTE — Progress Notes (Signed)
Physical Therapy Treatment Patient Details Name: Joshua Donovan MRN: [id number] DOB: 1955-03-09 Today's Date: 10/18/2021   History of Present Illness Pt is a 66 y.o. male who presented 08/02/21 with R foot pain and R-sided weakness. Transferred to [facility] 7/25. 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 S/p stent placement to the R vertebrobasilar junction 7/26, failed extubation. Trach and PEG placed 7/28. S/p bronchoscopic evaluation, oral reintubation and tracheostomy removal with stoma packing 7/31 due to trach site bleeding. S/p trach revision 8/2. PMH: DM, GERD, TBI with prior ICH, HLD, HTN    PT Comments    Patient seen with focus of session on ROM and positioning.  Noted hands and fingers tighter with R hand more swollen so positioned to encourage flexion with towel rolls and elevation; also noted painful and limited shoulder ROM so approximated joint during movement to help limit pain.  Patient with increased secretions again today and noted some diaphoresis.  RN and RT in to address.  Limited to in bed session with no +2 for OOB today.  Will continue and plan for OOB next session.  Discharge recommendations remains appropriate.    Recommendations for follow up therapy are one component of a multi-disciplinary discharge planning process, led by the attending physician.  Recommendations may be updated based on patient status, additional functional criteria and insurance authorization.  Follow Up Recommendations  Skilled nursing-short term rehab (<3 hours/day) Can patient physically be transported by private vehicle: No    Assistance Recommended at Discharge Frequent or constant Supervision/Assistance  Patient can return home with the following Assistance with cooking/housework;Two people to help with walking and/or transfers;Two people to help with bathing/dressing/bathroom;Assistance with feeding;Direct supervision/assist for medications management;Direct supervision/assist for financial management;Assist for transportation;Help with stairs or ramp for entrance   Equipment Recommendations  Other (comment) (TBA)    Recommendations for Other Services       Precautions / Restrictions Precautions Precautions: Fall Precaution Comments: trach collar, PEG, SBP < 180, abdominal binder,     Mobility  Bed Mobility               General bed mobility comments: total A to scoot up in bed (RN assisting)    Transfers                        Ambulation/Gait                   Stairs             Wheelchair Mobility    Modified Rankin (Stroke Patients Only) Modified Rankin (Stroke Patients Only) Pre-Morbid Rankin Score: No symptoms Modified Rankin: Severe disability     Balance                                            Cognition Arousal/Alertness: Awake/alert Behavior During Therapy: WFL for tasks assessed/performed Overall Cognitive Status: Difficult to assess                       Memory: Decreased  short-term memory Following Commands: Follows one step commands with increased time                Exercises General Exercises - Upper Extremity Shoulder Flexion: PROM, Both, 10 reps, Supine Shoulder ABduction: PROM, Both, 10 reps, Supine Elbow Flexion: PROM, Both, 10 reps, Supine Elbow Extension: PROM, Both, 10 reps, Supine Wrist Flexion: PROM, Both, 10 reps, Supine Wrist Extension: PROM, Both, 10 reps, Supine Digit Composite Flexion: PROM, Both, 10 reps, Supine Composite Extension: PROM, Both, 10 reps, Supine General Exercises -  Lower Extremity Ankle Circles/Pumps: PROM, 10 reps, Supine, Both Heel Slides: PROM, Both, 10 reps, Supine Hip ABduction/ADduction: PROM, Both, 10 reps, Supine Other Exercises Other Exercises: stretch to hamstrings and heel cords bilat Other Exercises: in supine cervical rotation AROM to L and AAROM to R x 5 Other Exercises: cervical flexion/extension in supine Other Exercises: PROM lateral cervical flexion in supine    General Comments General comments (skin integrity, edema, etc.): on TC with medical air; noted with tan colored secretions coating inside of his trach collar, RN replaced; states having copious secretions, SpO2 100%, but RR range 20's-40's      Pertinent Vitals/Pain      Home Living                          Prior Function            PT Goals (current goals can now be found in the care plan section) Progress towards PT goals: Progressing toward goals (slowly)    Frequency    Min 2X/week      PT Plan Current plan remains appropriate    Co-evaluation              AM-PAC PT "6 Clicks" Mobility   Outcome Measure  Help needed turning from your back to your side while in a flat bed without using bedrails?: Total Help needed moving from lying on your back to sitting on the side of a flat bed without using bedrails?: Total Help needed moving to and from a bed to a chair (including a wheelchair)?: Total Help needed standing up from a chair using your arms (e.g., wheelchair or bedside chair)?: Total Help needed to walk in hospital room?: Total Help needed climbing 3-5 steps with a railing? : Total 6 Click Score: 6    End of Session Equipment Utilized During Treatment: Oxygen Activity Tolerance: Patient tolerated treatment well Patient left: in bed;with call bell/phone within reach   PT Visit Diagnosis: Muscle weakness (generalized) (M62.81);Other symptoms and signs involving the nervous system (R29.898);Other abnormalities of gait and  mobility (R26.89);Other (comment) Hemiplegia - caused by: Cerebral infarction     Time: 1415-1445 PT Time Calculation (min) (ACUTE ONLY): 30 min  Charges:  $Therapeutic Exercise: 8-22 mins $Therapeutic Activity: 8-22 mins                     [name], PT Acute Rehabilitation Services Office:[phone] 10/18/2021    [name] 10/18/2021, 3:40 PM

## 2021-10-18 NOTE — Progress Notes (Signed)
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  Symptoms worsened and required transfer to [facility] for neuro IR intervention where he had a stent placed in R vertebrovasilar junction.  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  He had a tracheostomy performed on 7/26 c/b dislodgement and ENT revision on 8/2.  Pertinent  Medical History  TBI with ICH, DM, HTN, HLD  Significant Hospital Events: Including procedures, antibiotic start and stop dates in addition to other pertinent events    7/24 presented to [facility], ventral medulla CVA 7/25 tx to [facility] 7/26 cerebral angiogram with stent placement to right vertebro-basilar junction, failed extubation, left radial aline MRI brain> mild extension of stroke, now involving bilateral medial medullary, patent basilar artery and R VBJ stent  7/28 underwent bedside percutaneous tracheostomy and PEG tube placement, tolerated well 7/29 no acute issues overnight currently on SBT trial this a.m. and tolerating well, Cleviprex resumed earlier this a.m. due to severe hypertension 7/30 Hypertension persist despite adding oral agents, glucose remains elevated as well 7/31 Bleeding around trach site, bright red blood. Copious bloody secretions. Cleviprex off, SBPs 150s-160s. Basal insulin/TF coverage increased. Cefepime deescalated to ceftriaxone. CXR stable. Later in afternoon, continued peritrach bleeding. Bronchoscopic eval completed with intratracheal bleeding associated with trach. Removed, patient orally reintubated, stoma packed. 8/1 ENT consulted for trach revision. Lightly sedated. Vent full support/PRVC, given fatigue following events of yesterday. ENT attempted to  evaluate trach at bedside, could not pass scope. Plan for OR 8/2. ASA/Brilinta held. 8/2 to OR for trach redo 8/6 trach collar during day shift, back on vent overnight 8/10 Hemoptysis, blood with suctioning > improved 8/11 8/14 on 8L / 35% ATC  8/28: Stable vitals, increased tenacious secretions from trach. CXR suggesting new LLL basilar opacities atelectasis vs pna 09/07/2021 Decreased volume and thickness of secretions. No fevers. Tracheal aspirate from 8/28 with mod gram positive cocci and few gram negative rods today, culture pending. 9/5 start cefazolin x 7 days for MSSA pneumonia 9/8 Trach exchange   Interim History / Subjective:   Patient remains on trach collar No overnight issues  Objective   Blood pressure 131/89, pulse 86, temperature 98.6 F (37 C), temperature source Oral, resp. rate (!) 25, height '5\' 7"'$  (1.702 m), weight 79.8 kg, SpO2 97 %.    FiO2 (%):  [21 %] 21 %   Intake/Output Summary (Last 24 hours) at 10/18/2021 1816 Last data filed at 10/18/2021 0900 Gross per 24 hour  Intake 1200 ml  Output 1400 ml  Net -200 ml   Filed Weights   09/22/21 0459 10/02/21 0500 10/04/21 0500  Weight: 85.7 kg 79.8 kg 79.8 kg    Examination: Physical exam: General: Chronically ill-appearing male, lying on the bed, s/p trach on trach collar with Passy-Muir valve present HEENT: [place]/AT, eyes anicteric.  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

## 2021-10-18 NOTE — TOC Progression Note (Signed)
Transition of Care [facility]) - Progression Note    Patient Details  Name: Joshua Donovan MRN: [id number] Date of Birth: [birth year]  Transition of Care [facility]) CM/SW [place], [place] Phone Number: 10/18/2021, 3:22 PM  Clinical Narrative:     Called patient's daughter,[name] ,left voice message to return call.   Expected Discharge Plan: Skilled Nursing Facility Barriers to Discharge: SNF Pending bed offer  Expected Discharge Plan and Services Expected Discharge Plan: [place]   Discharge Planning Services: CM Consult Post Acute Care Choice: Long Term Acute Care (LTAC) Living arrangements for the past 2 months: Single Family Home                                       Social Determinants of Health (SDOH) Interventions    Readmission Risk Interventions     No data to display

## 2021-10-18 NOTE — Progress Notes (Signed)
Joshua Donovan  FIE:[id number] DOB: 16-Nov-1955 DOA: 08/03/2021 PCP: [facility], [place]    Brief Narrative:  66 year old with a history of ICH, DM 2, HTN, and HLD who presented to [facility] 7/24 with right hand numbness and weakness and was diagnosed with a small brainstem stroke.  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  He required intubation and was transferred to [facility] for an interventional radiology intervention.  He ultimately required tracheostomy and PEG tube placement, and has subsequently suffered a prolonged hospital stay.  Significant events:  7/24 presented to [facility], ventral medulla CVA 7/25 tx to [facility], treated with Cleviprex 7/26 cerebral angiogram with stent placement to right vertebro-basilar junction, failed extubation, MRI brain> mild extension of stroke, now involving bilateral medial medullary, patent basilar artery and R VBJ stent  7/28 bedside tracheostomy by PCCM and PEG tube placement by General Surgery 7/31 Bleeding around trach site, bright red blood.  Underwent bronchoscopy, Trach Removed, patient orally reintubated, stoma packed. 8/2 ENT took patient to OR for trach redo 8/9 transferred to medical floor on tracheostomy 8/12 vomiting after bolus feeding with worsening hypoxia 8/13 copious bleeding from the heparin injection site controlled with pressure dressing 8/14 bleeding has resolved.  Trach secretions blood-tinged. 8/15 insurance denied LTAC appeal.  CSW working on SNF. 8/28: peer to peer for LTAC looks like this was denied 8/29: V. tach runs - increased metoprolol  9/2: Slightly increased work of breathing-low-grade temps 99 for chest x-ray showed no interval changes 9/5:  Ancef started 2/2 secretions --no wbc or fever 9/12 completed abx course  Awaiting placement   Consultants:  Neurology Interventional Radiology PCCM Cardiology  Goals of Care:  Code Status: Full Code   DVT  prophylaxis: SCDs  Interim Hx: Stronger cough today, still with secretions  Assessment & Plan:  Acute stroke of medulla oblongata Status post stent to right vertebrobasilar junction 7/26 - persisting quadriplegia - continue aspirin, Brilinta, and Lipitor per Neurology - Neurology signed off 8/4 - will need outpatient Neurology follow-up - continue PT/OT/SLP - seeking SNF placement as insurance denied LTAC  Aspiration pneumonia - hospital-acquired MSSA pneumonia - resolved CXR 9/3 confirmed bibasilar infiltrates - treated with Ancef 9/5 > 9/12 - no clinical evidence of active infection at present -recheck x ray 10/8  Mild acute hypoxic respiratory failure As evidenced by tachypnea and desaturation to 89% despite 5 L trach collar oxygen support -follow-up CXR 10/2 without acute findings -trial of lasix IV again 10/9  Tracheostomy dependence Tracheostomy originally placed 1/66 - this was complicated by significant bleeding requiring reintubation and discontinuation of trach - ENT revised tracheostomy 8/2  History of alcohol abuse Continue empiric thiamine, multivitamin, and folic acid  Uncontrolled DM2 with hyperglycemia A1c 8.3 during this hospital stay - CBG remains well controlled  HTN blood pressure controlled  Mildly elevated LFTs - resolved  Now resolved -viral hepatitis panel negative  NSVT - resolved  Responded well to [place] - Cardiology has evaluated   Oropharyngeal dysphagia - PEG tube dependent Due to above - continue tube feeds and free water via tube  Hypokalemia - resolved Corrected with supplementation  Hypomagnesemia - resolved Corrected with supplementation  Small sacral decubitus POA Continue wound care/RN directed monitoring   Moisture associted skin damage B buttocks  Continue care as suggested by WOC   Family Communication: family at bedside Disposition: Awaiting SNF placement but it is unlikely this will happen anytime  soon   Objective: Blood pressure Marland Kitchen)  157/88, pulse 89, temperature 98.4 F (36.9 C), temperature source Oral, resp. rate 18, height '5\' 7"'$  (1.702 m), weight 79.8 kg, SpO2 96 %.  Intake/Output Summary (Last 24 hours) at 10/18/2021 1104 Last data filed at 10/18/2021 0900 Gross per 24 hour  Intake 1200 ml  Output 1400 ml  Net -200 ml   Filed Weights   09/22/21 0459 10/02/21 0500 10/04/21 0500  Weight: 85.7 kg 79.8 kg 79.8 kg    Examination:  General: Appearance:     Overweight male in no acute distress     Lungs:     respirations slightly labored, lots of secretions   Heart:    Normal heart rate.   MS:   All extremities are intact.   Neurologic:   Awake, alert      GFR: Estimated Creatinine Clearance: 92 mL/min (by C-G formula based on SCr of 0.62 mg/dL).   Scheduled Meds:  amLODipine  10 mg Per Tube Daily   aspirin  81 mg Per Tube Daily   atorvastatin  80 mg Per Tube Daily   carvedilol  37.5 mg Per Tube BID WC   diclofenac Sodium  2 g Topical QID   feeding supplement ([name])  237 mL Per Tube Q4H   feeding supplement (PROSource TF20)  60 mL Per Tube Daily   fiber supplement (BANATROL TF)  60 mL Per Tube BID   folic acid  1 mg Per Tube Daily   free water  100 mL Per Tube Q4H   guaiFENesin  10 mL Per Tube Q4H   hydrochlorothiazide  25 mg Per Tube Daily   insulin aspart  0-9 Units Subcutaneous Q4H   insulin detemir  40 Units Subcutaneous BID   multivitamin with minerals  1 tablet Per Tube Daily   mouth rinse  15 mL Mouth Rinse Q2H   potassium chloride  40 mEq Per Tube BID   thiamine  100 mg Per Tube Daily   ticagrelor  90 mg Per Tube BID     LOS: 76 days   [name] DO  If 7PM-7AM, please contact night-coverage per Amion 10/18/2021, 11:04 AM

## 2021-10-19 LAB — GLUCOSE, CAPILLARY
Glucose-Capillary: 110 mg/dL — ABNORMAL HIGH (ref 70–99)
Glucose-Capillary: 130 mg/dL — ABNORMAL HIGH (ref 70–99)
Glucose-Capillary: 137 mg/dL — ABNORMAL HIGH (ref 70–99)
Glucose-Capillary: 193 mg/dL — ABNORMAL HIGH (ref 70–99)
Glucose-Capillary: 193 mg/dL — ABNORMAL HIGH (ref 70–99)
Glucose-Capillary: 225 mg/dL — ABNORMAL HIGH (ref 70–99)
Glucose-Capillary: 235 mg/dL — ABNORMAL HIGH (ref 70–99)

## 2021-10-19 MED ORDER — JEVITY 1.5 CAL/FIBER PO LIQD
237.0000 mL | Freq: Every day | ORAL | Status: DC
  Administered 2021-10-19 – 2021-11-26 (×227): 237 mL
  Filled 2021-10-19 (×8): qty 237
  Filled 2021-10-19: qty 1000
  Filled 2021-10-19 (×3): qty 237
  Filled 2021-10-19: qty 1000
  Filled 2021-10-19 (×15): qty 237
  Filled 2021-10-19: qty 1000
  Filled 2021-10-19 (×20): qty 237
  Filled 2021-10-19 (×2): qty 1000
  Filled 2021-10-19 (×64): qty 237
  Filled 2021-10-19: qty 1000
  Filled 2021-10-19 (×2): qty 237
  Filled 2021-10-19: qty 1000
  Filled 2021-10-19 (×13): qty 237
  Filled 2021-10-19: qty 1000
  Filled 2021-10-19 (×5): qty 237
  Filled 2021-10-19: qty 1000
  Filled 2021-10-19 (×3): qty 237
  Filled 2021-10-19: qty 1000
  Filled 2021-10-19 (×8): qty 237
  Filled 2021-10-19 (×2): qty 1000
  Filled 2021-10-19 (×2): qty 237
  Filled 2021-10-19: qty 1000
  Filled 2021-10-19 (×13): qty 237
  Filled 2021-10-19: qty 1000
  Filled 2021-10-19 (×5): qty 237
  Filled 2021-10-19: qty 1000
  Filled 2021-10-19 (×2): qty 237
  Filled 2021-10-19: qty 1000
  Filled 2021-10-19 (×17): qty 237
  Filled 2021-10-19: qty 1000
  Filled 2021-10-19: qty 237
  Filled 2021-10-19: qty 1000
  Filled 2021-10-19 (×16): qty 237
  Filled 2021-10-19: qty 1000
  Filled 2021-10-19 (×15): qty 237
  Filled 2021-10-19: qty 1000
  Filled 2021-10-19 (×10): qty 237

## 2021-10-19 MED ORDER — SODIUM CHLORIDE 0.9 % IN NEBU: 3.0000 mL | INHALATION_SOLUTION | Freq: Three times a day (TID) | RESPIRATORY_TRACT | Status: DC | PRN

## 2021-10-19 NOTE — Progress Notes (Signed)
Mobility Specialist Progress Note   10/19/21 1630  Mobility  Activity Transferred from chair to bed  Level of Assistance +2 (takes two people) (Total care)  Assistive Device MaxiMove  Activity Response Tolerated well  $Mobility charge 1 Mobility   RN requesting assistance to get pt back in bed. No faults on transfer but pt having BM once in bed. Assisted in pericare and left w/ RN still present in room.  Holland Falling Mobility Specialist MS [facility] #:  [phone] Acute Rehab Office:  [phone]

## 2021-10-19 NOTE — Progress Notes (Signed)
Occupational Therapy Treatment Patient Details Name: Joshua Donovan MRN: [id number] DOB: Apr 10, 1955 Today's Date: 10/19/2021   History of present illness Pt is a 66 y.o. male who presented 08/02/21 with R foot pain and R-sided weakness. Transferred to [facility] 7/25. 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 S/p stent placement to the R vertebrobasilar junction 7/26, failed extubation. Trach and PEG placed 7/28. S/p bronchoscopic evaluation, oral reintubation and tracheostomy removal with stoma packing 7/31 due to trach site bleeding. S/p trach revision 8/2. PMH: DM, GERD, TBI with prior ICH, HLD, HTN   OT comments  Pt still with significant limitations in all selfcare tasks and active movement.  Increased extensor posturing noted in trunk, UEs, and LEs with attempted AAROM cervical extension.  Total assist for bed mobility with total +2 for transfers to EOB and to the tilt in space wheelchair.  Recommend continued acute care OT to continue progression with head control and AROM as well as positioning, bed mobility, and in order to help decrease dependency with basic ADLs.     Recommendations for follow up therapy are one component of a multi-disciplinary discharge planning process, led by the attending physician.  Recommendations may be updated based on patient status, additional functional criteria and insurance authorization.    Follow Up Recommendations  Skilled nursing-short term rehab (<3 hours/day)    Assistance Recommended at Discharge Frequent or constant Supervision/Assistance  Patient can return home with the following  Two  people to help with walking and/or transfers;Two people to help with bathing/dressing/bathroom;Assistance with cooking/housework;Assistance with feeding;Direct supervision/assist for medications management;Direct supervision/assist for financial management;Assist for transportation;Help with stairs or ramp for entrance   Equipment Recommendations  Other (comment) (TBD next venue of care)       Precautions / Restrictions Precautions Precautions: Fall Precaution Comments: trach collar, PEG, SBP < 180, abdominal binder, PMV on during waking hours if O2 sats are good Restrictions Weight Bearing Restrictions: No       Mobility Bed Mobility Overal bed mobility: Needs Assistance Bed Mobility: Rolling Rolling: Total assist Sidelying to sit: Total assist       General bed mobility comments: Total assist for all aspects of bed mobility    Transfers Overall transfer level: Needs assistance   Transfers: Bed to chair/wheelchair/BSC     Squat pivot transfers: Total assist      Lateral/Scoot Transfers: Total assist       Balance Overall balance assessment: Needs assistance Sitting-balance support: Feet supported, No upper extremity supported Sitting balance-Leahy Scale: Zero Sitting balance - Comments: Pt needs total assist for sitting balance.                                   ADL either performed or assessed with clinical judgement   ADL Overall ADL's : Needs assistance/impaired                                       General ADL Comments: Pt's daughter Joelene Millin present for part of session.  Provided hands on education  for PROM exercises for BUEs and had her return demonstrate.  Handout provided for reference as well.  PMV in place throughout session with sats at 94% or better on medical air at 5Ls and 28% trach collar.  Increased tightness noted in digit flexion with cueing to provide gentle place and hold with ROM.  Also, stressed importance of not  flexing shoulders past 90 degrees at most and only to a level that there was not a sign of pain if below this.  Transferred to EOB and then to tilt in space wheelchair with total assist squat pivot.  Pt left up with body and UEs positioned and nursing made aware that pt cannot activate call button.  He has sip and puff button but did not want it sticking in the edge of his mouth to use and lacks head control to move his head any direction actively to reach it if not placed in his mouth at this time.               Cognition Arousal/Alertness: Awake/alert Behavior During Therapy: WFL for tasks assessed/performed Overall Cognitive Status: Difficult to assess                                 General Comments: Attempts to follow one step commands consistently but exhibits significant motor impairments which limit his ability to successfully complete them.        Exercises General Exercises - Upper Extremity Shoulder Flexion: PROM, Supine, 10 reps, Both Elbow Extension: PROM, Both, 10 reps, Supine Wrist Flexion: PROM, Both, 10 reps, Supine Wrist Extension: PROM, Both, 10 reps, Supine Digit Composite Flexion: PROM, Both, 10 reps, Supine            Pertinent Vitals/ Pain       Pain Assessment Pain Assessment: Faces Pain Score: 0-No pain         Frequency  Min 2X/week        Progress Toward Goals  OT Goals(current goals can now be found in the care plan section)  Progress towards OT goals: Progressing toward goals (goals updated)  Acute Rehab OT Goals OT Goal Formulation: With patient Time For Goal Achievement: 11/02/21 Potential to Achieve Goals: [place] Discharge plan needs to be updated       AM-PAC OT "6 Clicks" Daily Activity     Outcome Measure   Help from another person eating meals?: Total Help from another person taking care of personal grooming?: Total Help from another person toileting, which includes using toliet, bedpan, or urinal?:  Total Help from another person bathing (including washing, rinsing, drying)?: Total Help from another person to put on and taking off regular upper body clothing?: Total   6 Click Score: 5    End of Session Equipment Utilized During Treatment: Oxygen  OT Visit Diagnosis: Other symptoms and signs involving the nervous system (R29.898);Muscle weakness (generalized) (M62.81);Feeding difficulties (R63.3);Other abnormalities of gait and mobility (R26.89);Unsteadiness on feet (R26.81);Other (comment)   Activity Tolerance Patient tolerated treatment well   Patient Left with call bell/phone within reach;in chair   Nurse Communication Mobility status;Need for lift equipment        Time: 2878-6767 OT Time Calculation (min): 66 min  Charges: OT General Charges $OT Visit: 1 Visit OT Treatments $Self Care/Home Management : 8-22 mins $Therapeutic Activity: 8-22 mins $Therapeutic Exercise: 23-37 mins  Delmos Velaquez OTR/L 10/19/2021, 3:46 PM

## 2021-10-19 NOTE — Progress Notes (Signed)
Misty Rago  ZOX:[id number] DOB: [birth year] DOA: 08/03/2021 PCP: [facility]    Brief Narrative:  66 year old with a history of ICH, DM 2, HTN, and HLD who presented to [facility] 7/24 with right hand numbness and weakness and was diagnosed with a small brainstem stroke.  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  He required intubation and was transferred to [facility] for an interventional radiology intervention.  He ultimately required tracheostomy and PEG tube placement, and has subsequently suffered a prolonged hospital stay.  Significant events:  7/24 presented to [facility], ventral medulla CVA 7/25 tx to [facility], treated with Cleviprex 7/26 cerebral angiogram with stent placement to right vertebro-basilar junction, failed extubation, MRI brain> mild extension of stroke, now involving bilateral medial medullary, patent basilar artery and R VBJ stent  7/28 bedside tracheostomy by PCCM and PEG tube placement by General Surgery 7/31 Bleeding around trach site, bright red blood.  Underwent bronchoscopy, Trach Removed, patient orally reintubated, stoma packed. 8/2 ENT took patient to OR for trach redo 8/9 transferred to medical floor on tracheostomy 8/12 vomiting after bolus feeding with worsening hypoxia 8/13 copious bleeding from the heparin injection site controlled with pressure dressing 8/14 bleeding has resolved.  Trach secretions blood-tinged. 8/15 insurance denied LTAC appeal.  CSW working on SNF. 8/28: peer to peer for LTAC looks like this was denied 8/29: V. tach runs - increased metoprolol  9/2: Slightly increased work of breathing-low-grade temps 99 for chest x-ray showed no interval changes 9/5:  Ancef started 2/2 secretions --no wbc or fever 9/12 completed abx course  Awaiting placement (doing PRN IV lasix)   Consultants:  Neurology Interventional Radiology PCCM Cardiology  Goals of Care:  Code Status: Full Code   DVT  prophylaxis: SCDs  Interim Hx: Stronger cough today, still with secretions  Assessment & Plan:  Acute stroke of medulla oblongata Status post stent to right vertebrobasilar junction 7/26 - persisting quadriplegia - continue aspirin, Brilinta, and Lipitor per Neurology - Neurology signed off 8/4 - will need outpatient Neurology follow-up - continue PT/OT/SLP - seeking SNF placement as insurance denied LTAC  Aspiration pneumonia - hospital-acquired MSSA pneumonia - resolved CXR 9/3 confirmed bibasilar infiltrates - treated with Ancef 9/5 > 9/12 - no clinical evidence of active infection at present -recheck x ray 10/8  Mild acute hypoxic respiratory failure As evidenced by tachypnea and desaturation to 89% despite 5 L trach collar oxygen support -follow-up CXR 10/2 without acute findings -trials of lasix IV again 10/9  Tracheostomy dependence Tracheostomy originally placed 8/11 - this was complicated by significant bleeding requiring reintubation and discontinuation of trach - ENT revised tracheostomy 8/2  History of alcohol abuse Continue empiric thiamine, multivitamin, and folic acid  Uncontrolled DM2 with hyperglycemia A1c 8.3 during this hospital stay - CBG remains well controlled  HTN blood pressure controlled  Mildly elevated LFTs - resolved  Now resolved -viral hepatitis panel negative  NSVT - resolved  Responded well to [place] - Cardiology has evaluated   Oropharyngeal dysphagia - PEG tube dependent Due to above - continue tube feeds and free water via tube  Hypokalemia - resolved Corrected with supplementation  Hypomagnesemia - resolved Corrected with supplementation  Small sacral decubitus POA Continue wound care/RN directed monitoring   Moisture associted skin damage B buttocks  Continue care as suggested by WOC   Family Communication: family at bedside Disposition: Awaiting SNF placement but it is unlikely this will happen anytime  soon  Objective: Blood pressure (!) 134/91, pulse 83, temperature 98.1 F (36.7 C), temperature source Axillary, resp. rate (!) 27, height '5\' 7"'$  (1.702 m), weight 79.8 kg, SpO2 96 %.  Intake/Output Summary (Last 24 hours) at 10/19/2021 1126 Last data filed at 10/19/2021 0830 Gross per 24 hour  Intake 0 ml  Output 700 ml  Net -700 ml   Filed Weights   09/22/21 0459 10/02/21 0500 10/04/21 0500  Weight: 85.7 kg 79.8 kg 79.8 kg    Examination:   General: Appearance:     Overweight male in no acute distress   Arms swollen, legs less swollen  Lungs:     respirations unlabored, secretions   Heart:    Normal heart rate.    MS:   All extremities are intact.   Neurologic:   Awake, alert        GFR: Estimated Creatinine Clearance: 92 mL/min (by C-G formula based on SCr of 0.62 mg/dL).   Scheduled Meds:  amLODipine  10 mg Per Tube Daily   aspirin  81 mg Per Tube Daily   atorvastatin  80 mg Per Tube Daily   carvedilol  37.5 mg Per Tube BID WC   diclofenac Sodium  2 g Topical QID   feeding supplement ([name])  237 mL Per Tube 6 X Daily   feeding supplement (PROSource TF20)  60 mL Per Tube Daily   folic acid  1 mg Per Tube Daily   free water  100 mL Per Tube Q4H   guaiFENesin  10 mL Per Tube Q4H   hydrochlorothiazide  25 mg Per Tube Daily   insulin aspart  0-9 Units Subcutaneous Q4H   insulin detemir  40 Units Subcutaneous BID   multivitamin with minerals  1 tablet Per Tube Daily   mouth rinse  15 mL Mouth Rinse Q2H   potassium chloride  40 mEq Per Tube BID   thiamine  100 mg Per Tube Daily   ticagrelor  90 mg Per Tube BID     LOS: 77 days   [name] DO  If 7PM-7AM, please contact night-coverage per Amion 10/19/2021, 11:26 AM

## 2021-10-19 NOTE — Progress Notes (Signed)
Nutrition Follow-up  DOCUMENTATION CODES:   Not applicable  INTERVENTION:   Continue TF via PEG:  6 cartons Jevity 1.5/day (2133kcal, 91g protein, 1086m free water) Prosource TF20 q day (80kcal, 20g protein) 1092mFWF q 4hrs (60037mree water)  DAILY NUTRIENT TOTAL: 2213kcal, 111g protein, 1681m67mee water  D/C Banatrol TF 60 ml BID.  MVI with minerals 1 tablet daily via tube.   NUTRITION DIAGNOSIS:   Inadequate oral intake related to acute illness as evidenced by NPO status.  Ongoing   GOAL:   Patient will meet greater than or equal to 90% of their needs  Met with TF via PEG  MONITOR:   Vent status, Labs, Weight trends, TF tolerance  REASON FOR ASSESSMENT:   Consult Enteral/tube feeding initiation and management  ASSESSMENT:   66 y73male admitted post brain stem stroke involving ventral medulla requiring stent placement to right vertebro-basilar junction. PMH includes HTN, TBI, DM, HLD  [facility] [place]  [employer].  CBG: 110-202  Medications reviewed and include Banatrol TF, folic acid, Novolog, Levemir, MVI with minerals, Klor-con, thiamine.  Weight stable ~ 80 kg   Diet Order:   Diet Order             Diet NPO time specified  Diet effective midnight                   EDUCATION NEEDS:   Not appropriate for education at this time  Skin:  Skin Assessment: Skin Integrity Issues: Skin Integrity Issues:: Other (Comment) Other: non-pressure wound bilateral buttock  Last BM:  10/7  Height:   Ht Readings from Last 1 Encounters:  08/04/21 _0  (1.702 m)    Weight:   Wt Readings from Last 1 Encounters:  10/04/21 79.8 kg    BMI:  Body mass index is 27.57 kg/m.  Estimated Nutritional Needs:   Kcal:  2000-2200 kcals  Protein:  100-115 g  Fluid:  >/= 2 L  Romeka Scifres P., RD, LDN, CNSC See AMiON for contact information

## 2021-10-20 LAB — COMPREHENSIVE METABOLIC PANEL
ALT: 46 U/L — ABNORMAL HIGH (ref 0–44)
AST: 21 U/L (ref 15–41)
Albumin: 2.7 g/dL — ABNORMAL LOW (ref 3.5–5.0)
Alkaline Phosphatase: 38 U/L (ref 38–126)
Anion gap: 9 (ref 5–15)
BUN: 16 mg/dL (ref 8–23)
CO2: 26 mmol/L (ref 22–32)
Calcium: 8.9 mg/dL (ref 8.9–10.3)
Chloride: 101 mmol/L (ref 98–111)
Creatinine, Ser: 0.55 mg/dL — ABNORMAL LOW (ref 0.61–1.24)
GFR, Estimated: >60 mL/min (ref >60)
Glucose, Bld: 195 mg/dL — ABNORMAL HIGH (ref 70–99)
Potassium: 3.4 mmol/L — ABNORMAL LOW (ref 3.5–5.1)
Sodium: 136 mmol/L (ref 135–145)
Total Bilirubin: 0.7 mg/dL (ref 0.3–1.2)
Total Protein: 6 g/dL — ABNORMAL LOW (ref 6.5–8.1)

## 2021-10-20 LAB — MAGNESIUM: Magnesium: 1.5 mg/dL — ABNORMAL LOW (ref 1.7–2.4)

## 2021-10-20 LAB — CBC WITH DIFFERENTIAL/PLATELET
Abs Immature Granulocytes: 0.02 10*3/uL (ref 0.00–0.07)
Basophils Absolute: 0.1 10*3/uL (ref 0.0–0.1)
Basophils Relative: 1 %
Eosinophils Absolute: 0.5 10*3/uL (ref 0.0–0.5)
Eosinophils Relative: 6 %
HCT: 36.7 % — ABNORMAL LOW (ref 39.0–52.0)
Hemoglobin: 12.4 g/dL — ABNORMAL LOW (ref 13.0–17.0)
Immature Granulocytes: 0 %
Lymphocytes Relative: 17 %
Lymphs Abs: 1.3 10*3/uL (ref 0.7–4.0)
MCH: 27.5 pg (ref 26.0–34.0)
MCHC: 33.8 g/dL (ref 30.0–36.0)
MCV: 81.4 fL (ref 80.0–100.0)
Monocytes Absolute: 0.7 10*3/uL (ref 0.1–1.0)
Monocytes Relative: 9 %
Neutro Abs: 5 10*3/uL (ref 1.7–7.7)
Neutrophils Relative %: 67 %
Platelets: 308 10*3/uL (ref 150–400)
RBC: 4.51 MIL/uL (ref 4.22–5.81)
RDW: 14.5 % (ref 11.5–15.5)
WBC: 7.5 10*3/uL (ref 4.0–10.5)
nRBC: 0 % (ref 0.0–0.2)

## 2021-10-20 LAB — GLUCOSE, CAPILLARY
Glucose-Capillary: 72 mg/dL (ref 70–99)
Glucose-Capillary: 118 mg/dL — ABNORMAL HIGH (ref 70–99)
Glucose-Capillary: 210 mg/dL — ABNORMAL HIGH (ref 70–99)
Glucose-Capillary: 237 mg/dL — ABNORMAL HIGH (ref 70–99)
Glucose-Capillary: 256 mg/dL — ABNORMAL HIGH (ref 70–99)
Glucose-Capillary: 209 mg/dL — ABNORMAL HIGH (ref 70–99)

## 2021-10-20 LAB — PHOSPHORUS: Phosphorus: 4.2 mg/dL (ref 2.5–4.6)

## 2021-10-20 LAB — BRAIN NATRIURETIC PEPTIDE: B Natriuretic Peptide: 10.9 pg/mL (ref 0.0–100.0)

## 2021-10-20 MED ORDER — FAMOTIDINE 40 MG/5ML PO SUSR
20.0000 mg | Freq: Every day | ORAL | Status: DC
  Administered 2021-10-20 – 2021-11-26 (×38): 20 mg
  Filled 2021-10-20 (×38): qty 2.5

## 2021-10-20 MED ORDER — INSULIN DETEMIR 100 UNIT/ML ~~LOC~~ SOLN
35.0000 [IU] | Freq: Two times a day (BID) | SUBCUTANEOUS | Status: DC
  Administered 2021-10-20 – 2021-10-22 (×6): 35 [IU] via SUBCUTANEOUS
  Filled 2021-10-20 (×7): qty 0.35

## 2021-10-20 MED ORDER — ENOXAPARIN SODIUM 40 MG/0.4ML IJ SOSY: 40.0000 mg | PREFILLED_SYRINGE | Freq: Every day | INTRAMUSCULAR | Status: DC

## 2021-10-20 MED ORDER — POTASSIUM CHLORIDE 20 MEQ PO PACK: 40.0000 meq | PACK | Freq: Two times a day (BID) | ORAL | Status: DC

## 2021-10-20 MED ORDER — MAGNESIUM SULFATE 4 GM/100ML IV SOLN
4.0000 g | Freq: Once | INTRAVENOUS | Status: AC
  Administered 2021-10-20: 4 g via INTRAVENOUS
  Filled 2021-10-20: qty 100

## 2021-10-20 MED ORDER — POTASSIUM CHLORIDE 10 MEQ/100ML IV SOLN
10.0000 meq | INTRAVENOUS | Status: AC
  Administered 2021-10-20 (×4): 10 meq via INTRAVENOUS
  Filled 2021-10-20 (×4): qty 100

## 2021-10-20 NOTE — Progress Notes (Signed)
   10/20/21 0953  Assess: MEWS Score  Pulse Rate 88  Resp (!) 31  SpO2 97 %  O2 Device Tracheostomy Collar  O2 Flow Rate (L/min) 5 L/min  FiO2 (%) 21 %  Assess: MEWS Score  MEWS Temp 0  MEWS Systolic 0  MEWS Pulse 0  MEWS RR 2  MEWS LOC 0  MEWS Score 2  MEWS Score Color Yellow  Assess: if the MEWS score is Yellow or Red  Were vital signs taken at a resting state? Yes  Focused Assessment Change from prior assessment (see assessment flowsheet)  Does the patient meet 2 or more of the SIRS criteria? No  Does the patient have a confirmed or suspected source of infection? No  Provider and Rapid Response Notified? No  MEWS guidelines implemented *See Row Information* No, vital signs rechecked  Assess: SIRS CRITERIA  SIRS Temperature  0  SIRS Pulse 0  SIRS Respirations  1  SIRS WBC 1  SIRS Score Sum  2

## 2021-10-20 NOTE — Progress Notes (Signed)
ANTICOAGULATION CONSULT NOTE - Initial Consult  Pharmacy Consult for Lovenox Indication: VTE prophylaxis  Allergies  Allergen Reactions   Lisinopril Anaphylaxis and Swelling    Angioedema (filled but not taking per family, 08/08/21).  TDD.   Anchovies [Fish Allergy] Swelling   Sulfa Antibiotics Swelling    Patient Measurements: Height: '5\' 7"'$  (170.2 cm) Weight: 79.8 kg (176 lb) IBW/kg (Calculated) : 66.1  Vital Signs: Temp: 98.3 F (36.8 C) (10/11 0800) Temp Source: Oral (10/11 0800) BP: 134/88 (10/11 0800) Pulse Rate: 83 (10/11 0800)  Labs: Recent Labs    10/20/21 0629  HGB 12.4*  HCT 36.7*  PLT 308  CREATININE 0.55*    Estimated Creatinine Clearance: 92 mL/min (A) (by C-G formula based on SCr of 0.55 mg/dL (L)).   Medical History: Past Medical History:  Diagnosis Date   Diabetes mellitus without complication ([place])    GERD (gastroesophageal reflux disease)    Hemorrhage in the brain ([place]) 08/2015   High cholesterol    Hyperlipidemia    Hypertension    Hypertensive emergency 08/03/2021    Medications:  Scheduled:   amLODipine  10 mg Per Tube Daily   aspirin  81 mg Per Tube Daily   atorvastatin  80 mg Per Tube Daily   carvedilol  37.5 mg Per Tube BID WC   diclofenac Sodium  2 g Topical QID   enoxaparin (LOVENOX) injection  40 mg Subcutaneous Daily   feeding supplement ([name])  237 mL Per Tube 6 X Daily   feeding supplement (PROSource TF20)  60 mL Per Tube Daily   folic acid  1 mg Per Tube Daily   free water  100 mL Per Tube Q4H   guaiFENesin  10 mL Per Tube Q4H   hydrochlorothiazide  25 mg Per Tube Daily   insulin aspart  0-9 Units Subcutaneous Q4H   insulin detemir  40 Units Subcutaneous BID   multivitamin with minerals  1 tablet Per Tube Daily   mouth rinse  15 mL Mouth Rinse Q2H   [START ON 10/21/2021] potassium chloride  40 mEq Per Tube BID   thiamine  100 mg Per Tube Daily   ticagrelor  90 mg Per Tube BID    Assessment: 66 y.o.   M presents with small brainstem stroke. Pharmacy consulted for Lovenox for VTE prophylaxis. CBC stable.  Goal of Therapy:  Prevention of VTE Monitor platelets by anticoagulation protocol: Yes   Plan:  Lovenox '40mg'$  SQ q24h Pharmacy will sign off - please reconsult if needed  Sherlon Handing, PharmD, BCPS Please see amion for complete clinical pharmacist phone list 10/20/2021,8:28 AM

## 2021-10-20 NOTE — Progress Notes (Addendum)
PROGRESS NOTE                                                                                                                                                                                                             Patient Demographics:    Joshua Donovan, is a 66 y.o. male, DOB - 11-20-1955, PPI:[id number]  Outpatient Primary MD for the patient is [facility]    LOS - 06  Admit date - 08/03/2021    No chief complaint on file.      Brief Narrative (HPI from H&P)   66 year old with a history of ICH, DM 2, HTN, and HLD who presented to [facility] 7/24 with right hand numbness and weakness and was diagnosed with a small brainstem stroke.  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  He required intubation and was transferred to [facility] for an interventional radiology intervention.  He ultimately required tracheostomy and PEG tube placement, and has subsequently suffered a prolonged hospital stay.   Significant events:  7/24 presented to [facility], ventral medulla CVA 7/25 tx to [facility], treated with Cleviprex 7/26 cerebral angiogram with stent placement to right vertebro-basilar junction, failed extubation, MRI brain> mild extension of stroke, now involving bilateral medial medullary, patent basilar artery and R VBJ stent  7/28 bedside tracheostomy by PCCM and PEG tube placement by General Surgery 7/31 Bleeding around trach site, bright red blood.  Underwent bronchoscopy, Trach Removed, patient orally reintubated, stoma packed. 8/2 ENT took patient to OR for trach redo 8/9 transferred to medical floor on tracheostomy 8/12 vomiting after bolus feeding with worsening hypoxia 8/13 copious bleeding from the heparin injection site controlled with pressure dressing 8/14 bleeding has resolved.  Trach secretions blood-tinged. 8/15 insurance denied LTAC appeal.  CSW working on SNF. 8/28:  peer to peer for LTAC looks like this was denied 8/29: V. tach runs - increased metoprolol  9/2: Slightly increased work of [place] [facility] for chest x-ray showed no interval changes 9/5:  Ancef started 2/2 secretions --no wbc or fever 9/12 completed abx course  Awaiting placement (doing PRN IV lasix)   Subjective:    Joshua Donovan today in bed appears to be in no distress denies any headache chest or abdominal pain.  No shortness of breath.   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  Continue supportive care.  Continue PT OT as tolerated, await SNF bed.  Discussed the case with stroke team MD [name] on 10/20/2021 duration of dual antiplatelet therapy is 6 months starting from 08/04/2021 thereafter aspirin alone.  Acute respiratory failure with hypoxia (HCC) along with aspiration pneumonia. Patient required ICU admission and mechanical ventilation after admission secondary to worsening mentation. Concern for possible aspiration pneumonitis. Patient unable to extubate. Tracheostomy placed on 7/31 and revised by ENT on 08/11/21 secondary to bleeding. Currently stable. -PCCM for trach management and he has finished antibiotic treatment for his infections.  Continue supportive care and trach hygiene per RT.  Alcohol abuse - Initially managed on CIWA.  No signs of DTs now, Continue thiamine, multivitamin and folic acid.  Mixed hyperlipidemia - -Continue Lipitor.  Hypertension - - Continue clonidine, amlodipine, hydrochlorothiazide and Coreg  NSVT (nonsustained ventricular tachycardia) (HCC) EF 60% - Patient with intermittent Vtach. Most recently with 25 beats of vtach on 08/30/21. QTc of 424 msec, -Replete potassium and magnesium, -Cardiology consult,  maintain mag >2 and k >4, no additioonal cardiac w/u.  Continue beta-blocker.  Dysphagia - Patient is s/p PEG tube and is on tube feeds via PEG. Continue tube feeds and free water.  Tracheostomy complication (HCC)-resolved as of 08/25/2021 - In setting of DAPT. ENT consulted for revision. Bleeding resolved.  Hypokalemia and hypomagnesemia.  Replace.    Uncontrolled type 2 diabetes mellitus with hyperglycemia (HCC) - Most recent hemoglobin A1C of 8.3%. uncontrolled with hyper- and hypoglycemia.  Levemir along with NovoLog every 4 hours, dose adjusted on 10/20/2021.    Lab Results  Component Value Date   HGBA1C 8.3 (H) 08/03/2021   CBG (last 3)  Recent Labs    10/19/21 2319 10/20/21 0337 10/20/21 0758  GLUCAP 225* 72 118*           Condition - Extremely Guarded  Family Communication  : None present  Code Status : Full code  Consults  : ENT, PCCM, neurology  PUD Prophylaxis : Pepcid   Procedures  :            Disposition Plan  :    Status is: Inpatient  DVT Prophylaxis  :     Place TED hose Start: 08/22/21 1546 SCD's Start: 08/03/21 2311   Lab Results  Component Value Date   PLT 308 10/20/2021    Diet :  Diet Order             Diet NPO time specified  Diet effective midnight                    Inpatient Medications  Scheduled Meds:  amLODipine  10 mg Per Tube Daily   aspirin  81 mg Per Tube Daily   atorvastatin  80 mg Per Tube Daily   carvedilol  37.5 mg Per Tube BID WC   diclofenac Sodium  2 g Topical QID   feeding supplement ([name])  237 mL Per Tube 6 X Daily   feeding supplement (PROSource TF20)  60 mL Per Tube Daily   folic acid  1 mg Per Tube Daily   free water  100 mL Per Tube Q4H   guaiFENesin  10 mL Per Tube Q4H   hydrochlorothiazide  25 mg  Per Tube Daily   insulin aspart  0-9 Units Subcutaneous Q4H   insulin detemir  40 Units Subcutaneous BID   multivitamin with minerals  1 tablet Per Tube Daily   mouth rinse   15 mL Mouth Rinse Q2H   potassium chloride  40 mEq Per Tube BID   thiamine  100 mg Per Tube Daily   ticagrelor  90 mg Per Tube BID   Continuous Infusions: PRN Meds:.[DISCONTINUED] acetaminophen **OR** acetaminophen (TYLENOL) oral liquid 160 mg/5 mL **OR** [DISCONTINUED] acetaminophen, bisacodyl, labetalol, liver oil-zinc oxide, mouth rinse, polyethylene glycol, sodium chloride     Objective:   Vitals:   10/19/21 2038 10/19/21 2320 10/20/21 0335 10/20/21 0800  BP:  (!) 150/88 119/81 134/88  Pulse: 86 92 84 83  Resp: (!) 24 (!) 39 (!) 28 (!) 25  Temp:  98.4 F (36.9 C) 98.2 F (36.8 C) 98.3 F (36.8 C)  TempSrc:  Oral Oral Oral  SpO2: 95% 94% 90% 96%  Weight:      Height:        Wt Readings from Last 3 Encounters:  10/04/21 79.8 kg  08/02/21 83.9 kg  02/20/20 88.6 kg     Intake/Output Summary (Last 24 hours) at 10/20/2021 0815 Last data filed at 10/19/2021 0830 Gross per 24 hour  Intake 0 ml  Output --  Net 0 ml     Physical Exam  Awake Alert, continues to have functional quadriplegia, PEG tube in place, tracheostomy in place with trach collar, male purewick [place].AT,PERRAL Supple Neck, No JVD,   Symmetrical Chest wall movement, Good air movement bilaterally, CTAB RRR,No Gallops,Rubs or new Murmurs,  +ve B.Sounds, Abd Soft, No tenderness,   No Cyanosis, Clubbing or edema      Data Review:    CBC Recent Labs  Lab 10/20/21 0629  WBC 7.5  HGB 12.4*  HCT 36.7*  PLT 308  MCV 81.4  MCH 27.5  MCHC 33.8  RDW 14.5  LYMPHSABS 1.3  MONOABS 0.7  EOSABS 0.5  BASOSABS 0.1    Electrolytes Recent Labs  Lab 10/20/21 0629  NA 136  K 3.4*  CL 101  CO2 26  GLUCOSE 195*  BUN 16  CREATININE 0.55*  CALCIUM 8.9  AST 21  ALT 46*  ALKPHOS 38  BILITOT 0.7  ALBUMIN 2.7*  MG 1.5*  BNP 10.9    ------------------------------------------------------------------------------------------------------------------ No results for input(s): "CHOL", "HDL",  "LDLCALC", "TRIG", "CHOLHDL", "LDLDIRECT" in the last 72 hours.  Lab Results  Component Value Date   HGBA1C 8.3 (H) 08/03/2021    No results for input(s): "TSH", "T4TOTAL", "T3FREE", "THYROIDAB" in the last 72 hours.  Invalid input(s): "FREET3" ------------------------------------------------------------------------------------------------------------------ ID Labs Recent Labs  Lab 10/20/21 0629  WBC 7.5  PLT 308  CREATININE 0.55*   Cardiac Enzymes No results for input(s): "CKMB", "TROPONINI", "MYOGLOBIN" in the last 168 hours.  Invalid input(s): "CK"       Micro Results No results found for this or any previous visit (from the past 240 hour(s)).  Radiology Reports DG CHEST PORT 1 VIEW  Result Date: 10/17/2021 CLINICAL DATA:  Cough EXAM: PORTABLE CHEST 1 VIEW COMPARISON:  Chest x-ray October 11, 2021 FINDINGS: Per technologist report, limitations in patient positioning and best image possible obtained. Bilateral costophrenic angles are excluded from the field of view. Tracheostomy tube in place. The cardiomediastinal silhouette is unchanged in contour. Low lung volumes and bronchovascular crowding. Unchanged bibasilar bandlike opacities, likely atelectasis. No new focal pulmonary opacity. No pleural effusion or pneumothorax.  Visualized upper abdomen is unremarkable. No acute osseous abnormality. IMPRESSION: No significant change from prior examination. Unchanged bibasilar atelectasis. Electronically Signed   By: Beryle Flock M.D.   On: 10/17/2021 14:07      Signature  Lala Lund M.D on 10/20/2021 at 8:15 AM   -  To page go to [email or web address]

## 2021-10-20 NOTE — Progress Notes (Signed)
Speech Language Pathology Treatment: [name] Speaking valve  Patient Details Name: Giovoni Bunch MRN: [id number] DOB: [birth year] Today's Date: 10/20/2021 Time: 1105-1130 SLP Time Calculation (min) (ACUTE ONLY): 25 min  Assessment / Plan / Recommendation Clinical Impression  Pt demonstrates improvement in coordination of breathing and phonation today. Pt able to phonate at word level when cued in 100% of attempts. Pt also able to complete 5 reps of EMST set at 5 cm H2O without struggle, then 5 more with occasional groping behavior. Pt completed one puff at 7 cm H2O with max effort. Daughter at bedside, SLP encouraged her to place and remove valve and provided instruction. Signs replaced reiterating all waking hours, intermittent supervision, Ok to wear valve if lightly napping while alone if Sats say in 90s, remove valve at night for full sleep. Daughter also cueing pt and requiring voice with pt responding.   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 He  presented 08/02/21 with R foot pain and R-sided weakness. Transferred to [facility] 7/25. 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 S/p stent placement to the R vertebrobasilar junction 7/26, failed extubation. Trach and PEG placed 7/28. PMH: DM, GERD, TBI with prior ICH, HLD, HTN      SLP Plan  Continue with current plan of care      Recommendations for follow up therapy are one component of a multi-disciplinary discharge planning process, led by the attending physician.  Recommendations may be updated based on patient status, additional functional criteria and insurance  authorization.    Recommendations         Patient may use Passy-Muir Speech Valve: Intermittently with supervision;Caregiver trained to provide supervision;During all waking hours (remove during sleep);During all therapies with supervision PMSV Supervision: Intermittent         Plan: Continue with current plan of care          [name], MA CCC-SLP  Acute Rehabilitation Services Secure Chat Preferred Office [phone]  [name]  10/20/2021, 1:00 PM

## 2021-10-20 NOTE — Progress Notes (Signed)
Physical Therapy Treatment Patient Details Name: Joshua Donovan MRN: [id number] DOB: November 21, 1955 Today's Date: 10/20/2021   History of Present Illness Pt is a 66 y.o. male who presented 08/02/21 with R foot pain and R-sided weakness. Transferred to [facility] 7/25. 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 S/p stent placement to the R vertebrobasilar junction 7/26, failed extubation. Trach and PEG placed 7/28. S/p bronchoscopic evaluation, oral reintubation and tracheostomy removal with stoma packing 7/31 due to trach site bleeding. S/p trach revision 8/2. PMH: DM, GERD, TBI with prior ICH, HLD, HTN    PT Comments    Patient with more frequent episodes of extensor tone exhibited during head movement especially posterior.  He enjoyed going outside and participated in UE PROM while outside.  He communicates still with one word bursts, but mouths more than one word at time.  Feel he will continue to progress very slowly with skilled PT.  Recommend SNF level rehab at d/c.    Recommendations for follow up therapy are one component of a multi-disciplinary discharge planning process, led by the attending physician.  Recommendations may be updated based on patient status, additional functional criteria and insurance authorization.  Follow Up Recommendations  Skilled nursing-short term rehab (<3 hours/day) Can patient physically be transported by private vehicle: No   Assistance Recommended at Discharge Frequent or constant Supervision/Assistance  Patient can return home with the following Assistance with cooking/housework;Two people to help with  walking and/or transfers;Two people to help with bathing/dressing/bathroom;Assistance with feeding;Direct supervision/assist for medications management;Direct supervision/assist for financial management;Assist for transportation;Help with stairs or ramp for entrance   Equipment Recommendations  Other (comment) (TBA)    Recommendations for Other Services       Precautions / Restrictions Precautions Precautions: Fall Precaution Comments: trach collar, PEG, SBP < 180, abdominal binder, PMV on during waking hours if O2 sats are good     Mobility  Bed Mobility Overal bed mobility: Needs Assistance Bed Mobility: Rolling Rolling: Total assist         General bed mobility comments: assist for placing lift pad    Transfers Overall transfer level: Needs assistance   Transfers: Bed to chair/wheelchair/BSC             General transfer comment: lift to tilt in space w/c Transfer via Lift Equipment: Maximove  Ambulation/Gait                   Stairs             Wheelchair Mobility    Modified Rankin (Stroke Patients Only) Modified Rankin (Stroke Patients Only) Pre-Morbid Rankin Score: No symptoms Modified Rankin: Severe disability     Balance Overall balance assessment: Needs assistance Sitting-balance support: Feet supported Sitting balance-Leahy Scale: Zero Sitting balance - Comments: total A for positioning in tilt in space chair, frequent extensor tone exhibited with head extension                                    Cognition Arousal/Alertness: Awake/alert Behavior During Therapy: WFL for tasks assessed/performed Overall Cognitive Status: Difficult to assess  General Comments: using PMSV, but limited vocalizations to about one word at a time, attempting to follow commands to the extent he can.        Exercises General Exercises - Upper Extremity Shoulder Flexion: PROM, 10 reps,  Both, Seated Shoulder ABduction: PROM, Both, 10 reps, Seated Elbow Flexion: PROM, Both, 10 reps, Supine, Seated Elbow Extension: PROM, Both, 10 reps, Seated Wrist Flexion: PROM, Both, 10 reps, Seated Wrist Extension: PROM, Both, 10 reps, Seated Digit Composite Flexion: PROM, Both, 10 reps, Seated Composite Extension: PROM, Both, 10 reps, Seated General Exercises - Lower Extremity Ankle Circles/Pumps: PROM, 10 reps, Supine, Both Heel Slides: PROM, Both, 10 reps, Supine Straight Leg Raises: PROM, 10 reps, Supine, Both Other Exercises Other Exercises: scapular mobiliziation bilateral x 8 for retraction/protraction    General Comments General comments (skin integrity, edema, etc.): VSS up in tilt in space chair and pt transported outside for few minutes      Pertinent Vitals/Pain Pain Assessment Pain Score: 0-No pain    Home Living                          Prior Function            PT Goals (current goals can now be found in the care plan section) Progress towards PT goals: Progressing toward goals (slowly)    Frequency    Min 2X/week      PT Plan Current plan remains appropriate    Co-evaluation              AM-PAC PT "6 Clicks" Mobility   Outcome Measure  Help needed turning from your back to your side while in a flat bed without using bedrails?: Total Help needed moving from lying on your back to sitting on the side of a flat bed without using bedrails?: Total Help needed moving to and from a bed to a chair (including a wheelchair)?: Total Help needed standing up from a chair using your arms (e.g., wheelchair or bedside chair)?: Total Help needed to walk in hospital room?: Total Help needed climbing 3-5 steps with a railing? : Total 6 Click Score: 6    End of Session Equipment Utilized During Treatment: Oxygen Activity Tolerance: Patient tolerated treatment well Patient left: in chair;with call bell/phone within reach Nurse Communication:  Need for lift equipment;Mobility status PT Visit Diagnosis: Muscle weakness (generalized) (M62.81);Other symptoms and signs involving the nervous system (R29.898);Other abnormalities of gait and mobility (R26.89);Other (comment) Hemiplegia - caused by: Cerebral infarction     Time: 1232-1322 PT Time Calculation (min) (ACUTE ONLY): 50 min  Charges:  $Therapeutic Exercise: 8-22 mins $Therapeutic Activity: 23-37 mins                     [name], PT Acute Rehabilitation Services [phone] 10/20/2021    [name] 10/20/2021, 2:55 PM

## 2021-10-20 NOTE — TOC Progression Note (Signed)
Transition of Care [facility]) - Progression Note    Patient Details  Name: Joshua Donovan MRN: [id number] Date of Birth: 1955/02/06  Transition of Care [facility]) CM/SW [place], [place] Phone Number: 10/20/2021, 2:46 PM  Clinical Narrative:     Sent SNF referrals to-  [facility] in [place] - in [place] will continue to follow and assist with discharge planning.  Thurmond Butts, MSW, LCSW Clinical Social Worker    Expected Discharge Plan: Skilled Nursing Facility Barriers to Discharge: SNF Pending bed offer  Expected Discharge Plan and Services Expected Discharge Plan: [place]   Discharge Planning Services: CM Consult Post Acute Care Choice: Long Term Acute Care (LTAC) Living arrangements for the past 2 months: Single Family Home                                       Social Determinants of Health (SDOH) Interventions    Readmission Risk Interventions     No data to display

## 2021-10-21 LAB — BASIC METABOLIC PANEL
Anion gap: 13 (ref 5–15)
BUN: 14 mg/dL (ref 8–23)
CO2: 27 mmol/L (ref 22–32)
Calcium: 9.4 mg/dL (ref 8.9–10.3)
Chloride: 100 mmol/L (ref 98–111)
Creatinine, Ser: 0.55 mg/dL — ABNORMAL LOW (ref 0.61–1.24)
GFR, Estimated: >60 mL/min (ref >60)
Glucose, Bld: 65 mg/dL — ABNORMAL LOW (ref 70–99)
Potassium: 3.1 mmol/L — ABNORMAL LOW (ref 3.5–5.1)
Sodium: 140 mmol/L (ref 135–145)

## 2021-10-21 LAB — GLUCOSE, CAPILLARY
Glucose-Capillary: 83 mg/dL (ref 70–99)
Glucose-Capillary: 133 mg/dL — ABNORMAL HIGH (ref 70–99)
Glucose-Capillary: 146 mg/dL — ABNORMAL HIGH (ref 70–99)
Glucose-Capillary: 152 mg/dL — ABNORMAL HIGH (ref 70–99)
Glucose-Capillary: 193 mg/dL — ABNORMAL HIGH (ref 70–99)
Glucose-Capillary: 223 mg/dL — ABNORMAL HIGH (ref 70–99)

## 2021-10-21 LAB — PHOSPHORUS: Phosphorus: 3.7 mg/dL (ref 2.5–4.6)

## 2021-10-21 LAB — MAGNESIUM: Magnesium: 2 mg/dL (ref 1.7–2.4)

## 2021-10-21 MED ORDER — POTASSIUM CHLORIDE 20 MEQ PO PACK
40.0000 meq | PACK | Freq: Two times a day (BID) | ORAL | Status: AC
  Administered 2021-10-21 (×2): 40 meq
  Filled 2021-10-21 (×2): qty 2

## 2021-10-21 MED ORDER — POTASSIUM CHLORIDE 10 MEQ/100ML IV SOLN
10.0000 meq | INTRAVENOUS | Status: AC
  Administered 2021-10-21 (×4): 10 meq via INTRAVENOUS
  Filled 2021-10-21 (×4): qty 100

## 2021-10-21 MED ORDER — HYDRALAZINE HCL 20 MG/ML IJ SOLN: 10.0000 mg | Freq: Four times a day (QID) | INTRAMUSCULAR | Status: DC | PRN

## 2021-10-21 MED ORDER — HYDRALAZINE HCL 50 MG PO TABS: 50.0000 mg | ORAL_TABLET | Freq: Three times a day (TID) | ORAL | Status: DC

## 2021-10-21 NOTE — Progress Notes (Signed)
PROGRESS NOTE                                                                                                                                                                                                             Patient Demographics:    Joshua Donovan, is a 66 y.o. male, DOB - [birth year], GBT:[id number]  Outpatient Primary MD for the patient is [facility] [place]    LOS - 71  Admit date - 08/03/2021    No chief complaint on file.      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  He required intubation and was transferred to [facility] for an interventional radiology intervention.  He ultimately required tracheostomy and PEG tube placement, and has subsequently suffered a prolonged hospital stay.   Significant events:  7/24 presented to [facility], ventral medulla CVA 7/25 tx to [facility], treated with Cleviprex 7/26 cerebral angiogram with stent placement to right vertebro-basilar junction, failed extubation, MRI brain> mild extension of stroke, now involving bilateral medial medullary, patent basilar artery and R VBJ stent  7/28 bedside tracheostomy by PCCM and PEG tube placement by General Surgery 7/31 Bleeding around trach site, bright red blood.  Underwent bronchoscopy, Trach Removed, patient orally reintubated, stoma packed. 8/2 ENT took patient to OR for trach redo 8/9 transferred to medical floor on tracheostomy 8/12 vomiting after bolus feeding with worsening hypoxia 8/13 copious bleeding from the heparin injection site controlled with pressure dressing 8/14 bleeding has resolved.  Trach secretions blood-tinged. 8/15 insurance denied LTAC appeal.  CSW working on SNF. 8/28:  peer to peer for LTAC looks like this was denied 8/29: V. tach runs - increased metoprolol  9/2: Slightly increased work of [place] [address] for chest x-ray showed no interval changes 9/5:  Ancef started 2/2 secretions --no wbc or fever 9/12 completed abx course  Awaiting placement (doing PRN IV lasix)   Subjective:   Patient in bed communicates with head nod and lip movement denies any headache chest or abdominal pain.   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  Continue supportive care.  Continue PT OT as tolerated, await SNF bed.  Discussed the case with stroke team MD [name] on 10/20/2021 duration of dual antiplatelet therapy is 6 months starting from 08/04/2021 thereafter aspirin alone.  Acute respiratory failure with hypoxia (HCC) along with aspiration pneumonia. Patient required ICU admission and mechanical ventilation after admission secondary to worsening mentation. Concern for possible aspiration pneumonitis. Patient unable to extubate. Tracheostomy placed on 7/31 and revised by ENT on 08/11/21 secondary to bleeding. Currently stable. -PCCM for trach management and he has finished antibiotic treatment for his infections.  Continue supportive care and trach hygiene per RT.  Alcohol abuse - Initially managed on CIWA.  No signs of DTs now, Continue thiamine, multivitamin and folic acid.  Mixed hyperlipidemia - -Continue Lipitor.  Hypertension - - Continue clonidine, amlodipine, hydrochlorothiazide and Coreg  NSVT (nonsustained ventricular tachycardia) (HCC) EF 60% - Patient with intermittent Vtach. Most recently with 25 beats of vtach on 08/30/21. QTc of 424 msec, -Replete potassium and magnesium, -Cardiology consult, maintain mag >2 and k >4,  no additioonal cardiac w/u.  Continue beta-blocker.  Dysphagia - Patient is s/p PEG tube and is on tube feeds via PEG. Continue tube feeds and free water.  Tracheostomy complication (HCC)-resolved as of 08/25/2021 - In setting of DAPT. ENT consulted for revision. Bleeding resolved.  Hypokalemia and hypomagnesemia.  Replaced, stopped HCTZ which likely is causing persistent hypokalemia.    Uncontrolled type 2 diabetes mellitus with hyperglycemia (HCC) - Most recent hemoglobin A1C of 8.3%. uncontrolled with hyper- and hypoglycemia.  Levemir along with NovoLog every 4 hours, dose adjusted on 10/20/2021.    Lab Results  Component Value Date   HGBA1C 8.3 (H) 08/03/2021   CBG (last 3)  Recent Labs    10/20/21 2315 10/21/21 0347 10/21/21 0807  GLUCAP 209* 83 133*           Condition - Extremely Guarded  Family Communication  : None present  Code Status : Full code  Consults  : ENT, PCCM, neurology  PUD Prophylaxis : Pepcid   Procedures  :            Disposition Plan  :    Status is: Inpatient  DVT Prophylaxis  :     Place TED hose Start: 08/22/21 1546 SCD's Start: 08/03/21 2311   Lab Results  Component Value Date   PLT 308 10/20/2021    Diet :  Diet Order             Diet NPO time specified  Diet effective now                    Inpatient Medications  Scheduled Meds:  amLODipine  10 mg Per Tube Daily   aspirin  81 mg Per Tube Daily   atorvastatin  80 mg Per Tube Daily   carvedilol  37.5 mg Per Tube BID WC   diclofenac Sodium  2 g Topical QID   famotidine  20 mg Per Tube Daily   feeding supplement ([name])  237 mL Per Tube 6 X Daily   feeding supplement (PROSource TF20)  60 mL Per Tube Daily   folic acid  1 mg Per Tube Daily   free water  100 mL Per Tube Q4H  guaiFENesin  10 mL Per Tube Q4H   insulin aspart  0-9 Units Subcutaneous Q4H   insulin detemir  35 Units Subcutaneous BID   multivitamin with minerals  1 tablet Per  Tube Daily   mouth rinse  15 mL Mouth Rinse Q2H   potassium chloride  40 mEq Per Tube BID   thiamine  100 mg Per Tube Daily   ticagrelor  90 mg Per Tube BID   Continuous Infusions:  potassium chloride 10 mEq (10/21/21 1029)   PRN Meds:.[DISCONTINUED] acetaminophen **OR** acetaminophen (TYLENOL) oral liquid 160 mg/5 mL **OR** [DISCONTINUED] acetaminophen, bisacodyl, hydrALAZINE, labetalol, liver oil-zinc oxide, mouth rinse, polyethylene glycol, sodium chloride     Objective:   Vitals:   10/21/21 0100 10/21/21 0337 10/21/21 0730 10/21/21 0919  BP: 138/84 120/81 (!) 159/98   Pulse: 81 80 86 95  Resp: (!) 30 (!) 29 (!) 35 (!) 25  Temp:  98.8 F (37.1 C) 97.8 F (36.6 C)   TempSrc:  Oral Oral   SpO2: 96% 97% 92% 98%  Weight:      Height:        Wt Readings from Last 3 Encounters:  10/04/21 79.8 kg  08/02/21 83.9 kg  02/20/20 88.6 kg     Intake/Output Summary (Last 24 hours) at 10/21/2021 1032 Last data filed at 10/21/2021 0357 Gross per 24 hour  Intake 400 ml  Output 1000 ml  Net -600 ml     Physical Exam  Awake Alert, continues to have functional quadriplegia, PEG tube in place, tracheostomy in place with trach collar, male [place].AT,PERRAL Supple Neck, No JVD,   Symmetrical Chest wall movement, Good air movement bilaterally, CTAB RRR,No Gallops, Rubs or new Murmurs,  +ve B.Sounds, Abd Soft, No tenderness,   No Cyanosis, Clubbing or edema       Data Review:    CBC Recent Labs  Lab 10/20/21 0629  WBC 7.5  HGB 12.4*  HCT 36.7*  PLT 308  MCV 81.4  MCH 27.5  MCHC 33.8  RDW 14.5  LYMPHSABS 1.3  MONOABS 0.7  EOSABS 0.5  BASOSABS 0.1    Electrolytes Recent Labs  Lab 10/20/21 0629 10/21/21 0305  NA 136 140  K 3.4* 3.1*  CL 101 100  CO2 26 27  GLUCOSE 195* 65*  BUN 16 14  CREATININE 0.55* 0.55*  CALCIUM 8.9 9.4  AST 21  --   ALT 46*  --   ALKPHOS 38  --   BILITOT 0.7  --   ALBUMIN 2.7*  --   MG 1.5* 2.0  BNP 10.9  --      ------------------------------------------------------------------------------------------------------------------ No results for input(s): "CHOL", "HDL", "LDLCALC", "TRIG", "CHOLHDL", "LDLDIRECT" in the last 72 hours.  Lab Results  Component Value Date   HGBA1C 8.3 (H) 08/03/2021   ID Labs Recent Labs  Lab 10/20/21 0629 10/21/21 0305  WBC 7.5  --   PLT 308  --   CREATININE 0.55* 0.55*   Cardiac Enzymes No results for input(s): "CKMB", "TROPONINI", "MYOGLOBIN" in the last 168 hours.  Invalid input(s): "CK"   Micro Results No results found for this or any previous visit (from the past 240 hour(s)).  Radiology Reports DG CHEST PORT 1 VIEW  Result Date: 10/17/2021 CLINICAL DATA:  Cough EXAM: PORTABLE CHEST 1 VIEW COMPARISON:  Chest x-ray October 11, 2021 FINDINGS: Per technologist report, limitations in patient positioning and best image possible obtained. Bilateral costophrenic angles are excluded from the field of view. Tracheostomy tube in place.  The cardiomediastinal silhouette is unchanged in contour. Low lung volumes and bronchovascular crowding. Unchanged bibasilar bandlike opacities, likely atelectasis. No new focal pulmonary opacity. No pleural effusion or pneumothorax. Visualized upper abdomen is unremarkable. No acute osseous abnormality. IMPRESSION: No significant change from prior examination. Unchanged bibasilar atelectasis. Electronically Signed   By: Beryle Flock M.D.   On: 10/17/2021 14:07      Signature  Lala Lund M.D on 10/21/2021 at 10:32 AM   -  To page go to [email or web address]

## 2021-10-21 NOTE — Progress Notes (Signed)
SLP Cancellation Note  Patient Details Name: Natale Thoma MRN: [id number] DOB: 1955/06/17   Cancelled treatment:       Reason Eval/Treat Not Completed: Other (comment). Pt occupied with staff   Elley Harp, [name] 10/21/2021, 10:58 AM

## 2021-10-21 NOTE — Progress Notes (Signed)
Occupational Therapy Treatment Patient Details Name: Joshua Donovan MRN: [id number] DOB: [birth year] Today's Date: 10/21/2021   History of present illness Pt is a 66 y.o. male who presented 08/02/21 with R foot pain and R-sided weakness. Transferred to [facility] 7/25. 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 S/p stent placement to the R vertebrobasilar junction 7/26, failed extubation. Trach and PEG placed 7/28. S/p bronchoscopic evaluation, oral reintubation and tracheostomy removal with stoma packing 7/31 due to trach site bleeding. S/p trach revision 8/2. PMH: DM, GERD, TBI with prior ICH, HLD, HTN   OT comments  Pt currently still total assist for all bed mobility and squat pivot transfers.  Continues to make some gains with head control and activation of cervical extension and rotation with some increased extensor tone noted.  Recommend continued OT at this time to continue working on ROM gains, sitting balance, and decreasing caregiver dependency for basic ADLs.     Recommendations for follow up therapy are one component of a multi-disciplinary discharge planning process, led by the attending physician.  Recommendations may be updated based on patient status, additional functional criteria and insurance authorization.    Follow Up Recommendations  Skilled nursing-short term rehab (<3 hours/day)    Assistance Recommended at Discharge Frequent or constant Supervision/Assistance  Patient can return home with the following  Two people to help with walking and/or transfers;Two people to help with bathing/dressing/bathroom;Assistance with  cooking/housework;Assistance with feeding;Direct supervision/assist for medications management;Direct supervision/assist for financial management;Assist for transportation;Help with stairs or ramp for entrance   Equipment Recommendations  Other (comment) (TBD next venue of care)       Precautions / Restrictions Precautions Precautions: Fall Precaution Comments: trach collar, PEG, SBP < 180, abdominal binder, PMV on during waking hours if O2 sats are good Restrictions Weight Bearing Restrictions: No       Mobility Bed Mobility Overal bed mobility: Needs Assistance Bed Mobility: Rolling Rolling: Total assist Sidelying to sit: Total assist       General bed mobility comments: Assist for all aspects of bed mobility and supine to sit    Transfers Overall transfer level: Needs assistance   Transfers: Bed to chair/wheelchair/BSC     Squat pivot transfers: Total assist             Balance Overall balance assessment: Needs assistance   Sitting balance-Leahy Scale: Zero Sitting balance - Comments: Total assist for static sitting balance.     Standing balance-Leahy Scale: Zero                             ADL either performed or assessed with clinical judgement   ADL Overall ADL's : Needs assistance/impaired                                     Functional mobility during ADLs: Total assistance (squat pivot transfer to the wheelchair) General ADL Comments: Pt in bed to start agreeable to OOB.  Worked on rolling and siselying to sit with total assist.  While sitting BP checked at around 145/90.  Oxygen sats at 94% on 5Ls medical air 28% trach.  He worked on head control in sitting with min assist to extend his head to midline and maintain for up to 30 second intervals.  Still with increased extensor patterns noted in trunk, UEs, and LEs at time with movement.  Total assist for sitting balance and for squat pivot transfer to the tilt in space  wheelchair.      Cognition Arousal/Alertness: Awake/alert Behavior During Therapy: WFL for tasks assessed/performed Overall Cognitive Status: Difficult to assess                                 General Comments: PMSV in place, pt vocalizing yes and no in a soft voice to simple questioning.                   Pertinent Vitals/ Pain       Pain Assessment Pain Assessment: Faces Pain Score: 0-No pain         Frequency  Min 2X/week        Progress Toward Goals  OT Goals(current goals can now be found in the care plan section)  Progress towards OT goals: Progressing toward goals  Acute Rehab OT Goals Potential to Achieve Goals: [place] Discharge plan remains appropriate       AM-PAC OT "6 Clicks" Daily Activity     Outcome Measure   Help from another person eating meals?: Total Help from another person taking care of personal grooming?: Total Help from another person toileting, which includes using toliet, bedpan, or urinal?: Total Help from another person bathing (including washing, rinsing, drying)?: Total Help from another person to put on and taking off regular upper body clothing?: Total Help from another person to put on and taking off regular lower body clothing?: Total 6 Click Score: 6    End of Session Equipment Utilized During Treatment: Oxygen  OT Visit Diagnosis: Other symptoms and signs involving the nervous system (R29.898);Muscle weakness (generalized) (M62.81);Feeding difficulties (R63.3);Other abnormalities of gait and mobility (R26.89);Unsteadiness on feet (R26.81);Other (comment)   Activity Tolerance Patient tolerated treatment well   Patient Left with call bell/phone within reach;in chair   Nurse Communication Mobility status;Need for lift equipment        Time: 6015-6153 OT Time Calculation (min): 55 min  Charges: OT General Charges $OT Visit: 1 Visit OT Treatments $Self Care/Home Management : 23-37  mins $Therapeutic Activity: 23-37 mins  Joshua Donovan OTR/L 10/21/2021, 4:32 PM

## 2021-10-22 LAB — BASIC METABOLIC PANEL
Anion gap: 9 (ref 5–15)
BUN: 11 mg/dL (ref 8–23)
CO2: 25 mmol/L (ref 22–32)
Calcium: 8.9 mg/dL (ref 8.9–10.3)
Chloride: 102 mmol/L (ref 98–111)
Creatinine, Ser: 0.49 mg/dL — ABNORMAL LOW (ref 0.61–1.24)
GFR, Estimated: >60 mL/min (ref >60)
Glucose, Bld: 169 mg/dL — ABNORMAL HIGH (ref 70–99)
Potassium: 3.7 mmol/L (ref 3.5–5.1)
Sodium: 136 mmol/L (ref 135–145)

## 2021-10-22 LAB — GLUCOSE, CAPILLARY
Glucose-Capillary: 94 mg/dL (ref 70–99)
Glucose-Capillary: 138 mg/dL — ABNORMAL HIGH (ref 70–99)
Glucose-Capillary: 186 mg/dL — ABNORMAL HIGH (ref 70–99)
Glucose-Capillary: 226 mg/dL — ABNORMAL HIGH (ref 70–99)
Glucose-Capillary: 207 mg/dL — ABNORMAL HIGH (ref 70–99)
Glucose-Capillary: 224 mg/dL — ABNORMAL HIGH (ref 70–99)

## 2021-10-22 NOTE — TOC Progression Note (Signed)
Transition of Care [facility]) - Progression Note    Patient Details  Name: Zai Chmiel MRN: [id number] Date of Birth: 11-07-1955  Transition of Care [facility]) CM/SW [place], [place] Phone Number: 10/22/2021, 3:40 PM  Clinical Narrative:     Spoke with patient's daughter,-family declined bed offer in [place], [place] - expressed it was too far away and family would not be able to monitor and be engaged in his care. CSW updated [facility] not in network and [facility] (closer than [place]) has not responded to referral at this time.   CSW called [facility] 2x  - left voice message to return call.  TOC will continue to follow and assist with discharge planning.  Thurmond Butts, MSW, LCSW Clinical Social Worker    Expected Discharge Plan: Skilled Nursing Facility Barriers to Discharge: SNF Pending bed offer  Expected Discharge Plan and Services Expected Discharge Plan: [place]   Discharge Planning Services: CM Consult Post Acute Care Choice: Long Term Acute Care (LTAC) Living arrangements for the past 2 months: Single Family Home                                       Social Determinants of Health (SDOH) Interventions    Readmission Risk Interventions     No data to display

## 2021-10-22 NOTE — Progress Notes (Signed)
Speech Language Pathology Treatment: [name] Speaking valve  Patient Details Name: Efrain Clauson MRN: [id number] DOB: 01/04/1956 Today's Date: 10/22/2021 Time: 0973-5329 SLP Time Calculation (min) (ACUTE ONLY): 13 min  Assessment / Plan / Recommendation Clinical Impression  Pt continues to demonstrates improvement with breath support for voice. Pt almost 100% intelligible during session with only 2 revision needed. Pt still needs occasional cues to increase depth of breath for audible voice at times. Now able to prolong breath for short phrases. Pt completed 5 reps on EMST set to 7 and 5 reps at 6 cm H20. Flow through device was only really good on one rep. Will continue efforts.   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 He  presented 08/02/21 with R foot pain and R-sided weakness. Transferred to [facility] 7/25. 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 S/p stent placement to the R vertebrobasilar junction 7/26, failed extubation. Trach and PEG placed 7/28. PMH: DM, GERD, TBI with prior ICH, HLD, HTN      SLP Plan  Continue with current plan of care      Recommendations for follow up therapy are one component of a multi-disciplinary discharge planning process, led by the attending physician.  Recommendations may be updated based on patient status, additional functional criteria and insurance authorization.    Recommendations                   Plan: Continue with current plan of care           [place], [name]  10/22/2021, 9:56 AM

## 2021-10-22 NOTE — Progress Notes (Signed)
Per order, RT x2 at bedside and removed #6 shiley flex cuffless trach and replaced with a new #6 shiley flex cuffless. Pt tolerated well with with no complications. Positive color change was noted. Head of bed sheet was updated as well. All necessary trach equipment at bedside. RT will continue to monitor pt.

## 2021-10-22 NOTE — Progress Notes (Signed)
PROGRESS NOTE                                                                                                                                                                                                             Patient Demographics:    Joshua Donovan, is a 66 y.o. male, DOB - Feb 18, 1955, EMV:[id number]  Outpatient Primary MD for the patient is [facility] [place]    LOS - 80  Admit date - 08/03/2021    No chief complaint on file.      Brief Narrative (HPI from H&P)   66 year old with a history of ICH, DM 2, HTN, and HLD who presented to [facility] 7/24 with right hand numbness and weakness and was diagnosed with a small brainstem stroke.  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  He required intubation and was transferred to [facility] for an interventional radiology intervention.  He ultimately required tracheostomy and PEG tube placement, and has subsequently suffered a prolonged hospital stay.   Significant events:  7/24 presented to [facility], ventral medulla CVA 7/25 tx to [facility], treated with Cleviprex 7/26 cerebral angiogram with stent placement to right vertebro-basilar junction, failed extubation, MRI brain> mild extension of stroke, now involving bilateral medial medullary, patent basilar artery and R VBJ stent  7/28 bedside tracheostomy by PCCM and PEG tube placement by General Surgery 7/31 Bleeding around trach site, bright red blood.  Underwent bronchoscopy, Trach Removed, patient orally reintubated, stoma packed. 8/2 ENT took patient to OR for trach redo 8/9 transferred to medical floor on tracheostomy 8/12 vomiting after bolus feeding with worsening hypoxia 8/13 copious bleeding from the heparin injection site controlled with pressure dressing 8/14 bleeding has resolved.  Trach secretions blood-tinged. 8/15 insurance denied LTAC appeal.  CSW working on SNF. 8/28:  peer to peer for LTAC looks like this was denied 8/29: V. tach runs - increased metoprolol  9/2: Slightly increased work of [place] 99 for chest x-ray showed no interval changes 9/5:  Ancef started 2/2 secretions --no wbc or fever 9/12 completed abx course  Awaiting placement     Subjective:   Patient in bed communicates with head nod and lip movement, denies any headache chest or abdominal pain, no distress.   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  Continue supportive care.  Continue PT OT as tolerated, await SNF bed.  Discussed the case with stroke team MD [name] on 10/20/2021 duration of dual antiplatelet therapy is 6 months starting from 08/04/2021 thereafter aspirin alone.  Acute respiratory failure with hypoxia (HCC) along with aspiration pneumonia. Patient required ICU admission and mechanical ventilation after admission secondary to worsening mentation. Concern for possible aspiration pneumonitis. Patient unable to extubate. Tracheostomy placed on 7/31 and revised by ENT on 08/11/21 secondary to bleeding. Currently stable. -PCCM for trach management and he has finished antibiotic treatment for his infections.  Continue supportive care and trach hygiene per RT.  Alcohol abuse - Initially managed on CIWA.  No signs of DTs now, Continue thiamine, multivitamin and folic acid.  Mixed hyperlipidemia - -Continue Lipitor.  Hypertension - - Continue clonidine, amlodipine, hydrochlorothiazide and Coreg  NSVT (nonsustained ventricular tachycardia) (HCC) EF 60% - Patient with intermittent Vtach. Most recently with 25 beats of vtach on 08/30/21. QTc of 424 msec, -Replete potassium and magnesium, -Cardiology consult, maintain mag >2 and k >4, no  additioonal cardiac w/u.  Continue beta-blocker.  Dysphagia - Patient is s/p PEG tube and is on tube feeds via PEG. Continue tube feeds and free water.  Tracheostomy complication (HCC)-resolved as of 08/25/2021 - In setting of DAPT. ENT consulted for revision. Bleeding resolved.  Hypokalemia and hypomagnesemia.  Replaced, stopped HCTZ which likely is causing persistent hypokalemia.    Uncontrolled type 2 diabetes mellitus with hyperglycemia (HCC) - Most recent hemoglobin A1C of 8.3%. uncontrolled with hyper- and hypoglycemia.  Levemir along with NovoLog every 4 hours, dose adjusted on 10/20/2021.    Lab Results  Component Value Date   HGBA1C 8.3 (H) 08/03/2021   CBG (last 3)  Recent Labs    10/21/21 2303 10/22/21 0344 10/22/21 0813  GLUCAP 223* 94 138*           Condition - Extremely Guarded  Family Communication  : None present  Code Status : Full code  Consults  : ENT, PCCM, neurology  PUD Prophylaxis : Pepcid   Procedures  :            Disposition Plan  :    Status is: Inpatient  DVT Prophylaxis  :     Place TED hose Start: 08/22/21 1546 SCD's Start: 08/03/21 2311   Lab Results  Component Value Date   PLT 308 10/20/2021    Diet :  Diet Order             Diet NPO time specified  Diet effective now                    Inpatient Medications  Scheduled Meds:  amLODipine  10 mg Per Tube Daily   aspirin  81 mg Per Tube Daily   atorvastatin  80 mg Per Tube Daily   carvedilol  37.5 mg Per Tube BID WC   diclofenac Sodium  2 g Topical QID   famotidine  20 mg Per Tube Daily   feeding supplement ([name])  237 mL Per Tube 6 X Daily   feeding supplement (PROSource TF20)  60 mL Per Tube Daily   folic acid  1 mg Per Tube Daily   free water  100 mL Per Tube Q4H  guaiFENesin  10 mL Per Tube Q4H   insulin aspart  0-9 Units Subcutaneous Q4H   insulin detemir  35 Units Subcutaneous BID   multivitamin with minerals  1 tablet Per Tube  Daily   mouth rinse  15 mL Mouth Rinse Q2H   thiamine  100 mg Per Tube Daily   ticagrelor  90 mg Per Tube BID   Continuous Infusions:   PRN Meds:.[DISCONTINUED] acetaminophen **OR** acetaminophen (TYLENOL) oral liquid 160 mg/5 mL **OR** [DISCONTINUED] acetaminophen, bisacodyl, hydrALAZINE, labetalol, liver oil-zinc oxide, mouth rinse, polyethylene glycol, sodium chloride     Objective:   Vitals:   10/22/21 0309 10/22/21 0421 10/22/21 0745 10/22/21 0800  BP: 133/85 (!) 140/86 111/73 111/73  Pulse: 82  84 84  Resp: 20 (!) 22 (!) 25 (!) 28  Temp:  98.4 F (36.9 C) 98.8 F (37.1 C)   TempSrc:  Axillary Oral   SpO2: 98%  96% 99%  Weight:      Height:        Wt Readings from Last 3 Encounters:  10/04/21 79.8 kg  08/02/21 83.9 kg  02/20/20 88.6 kg     Intake/Output Summary (Last 24 hours) at 10/22/2021 1110 Last data filed at 10/21/2021 1650 Gross per 24 hour  Intake --  Output 1200 ml  Net -1200 ml     Physical Exam  Awake Alert, continues to have functional quadriplegia, PEG tube in place, tracheostomy in place with trach collar, male purewick [place].AT,PERRAL Supple Neck, No JVD,   Symmetrical Chest wall movement, Good air movement bilaterally, CTAB RRR,No Gallops, Rubs or new Murmurs,  +ve B.Sounds, Abd Soft, No tenderness,   No Cyanosis, Clubbing or edema        Data Review:    CBC Recent Labs  Lab 10/20/21 0629  WBC 7.5  HGB 12.4*  HCT 36.7*  PLT 308  MCV 81.4  MCH 27.5  MCHC 33.8  RDW 14.5  LYMPHSABS 1.3  MONOABS 0.7  EOSABS 0.5  BASOSABS 0.1    Electrolytes Recent Labs  Lab 10/20/21 0629 10/21/21 0305 10/22/21 0555  NA 136 140 136  K 3.4* 3.1* 3.7  CL 101 100 102  CO2 '26 27 25  '$ GLUCOSE 195* 65* 169*  BUN '16 14 11  '$ CREATININE 0.55* 0.55* 0.49*  CALCIUM 8.9 9.4 8.9  AST 21  --   --   ALT 46*  --   --   ALKPHOS 38  --   --   BILITOT 0.7  --   --   ALBUMIN 2.7*  --   --   MG 1.5* 2.0  --   BNP 10.9  --   --      ------------------------------------------------------------------------------------------------------------------ No results for input(s): "CHOL", "HDL", "LDLCALC", "TRIG", "CHOLHDL", "LDLDIRECT" in the last 72 hours.  Lab Results  Component Value Date   HGBA1C 8.3 (H) 08/03/2021   ID Labs Recent Labs  Lab 10/20/21 0629 10/21/21 0305 10/22/21 0555  WBC 7.5  --   --   PLT 308  --   --   CREATININE 0.55* 0.55* 0.49*   Cardiac Enzymes No results for input(s): "CKMB", "TROPONINI", "MYOGLOBIN" in the last 168 hours.  Invalid input(s): "CK"   Micro Results No results found for this or any previous visit (from the past 240 hour(s)).  Radiology Reports No results found.    Signature  Lala Lund M.D on 10/22/2021 at 11:10 AM   -  To page go to [email or web address]

## 2021-10-23 ENCOUNTER — Inpatient Hospital Stay (HOSPITAL_COMMUNITY): Payer: Medicare PPO

## 2021-10-23 LAB — BASIC METABOLIC PANEL
Anion gap: 9 (ref 5–15)
BUN: 11 mg/dL (ref 8–23)
CO2: 26 mmol/L (ref 22–32)
Calcium: 9.1 mg/dL (ref 8.9–10.3)
Chloride: 103 mmol/L (ref 98–111)
Creatinine, Ser: 0.5 mg/dL — ABNORMAL LOW (ref 0.61–1.24)
GFR, Estimated: >60 mL/min (ref >60)
Glucose, Bld: 72 mg/dL (ref 70–99)
Potassium: 3.4 mmol/L — ABNORMAL LOW (ref 3.5–5.1)
Sodium: 138 mmol/L (ref 135–145)

## 2021-10-23 LAB — GLUCOSE, CAPILLARY
Glucose-Capillary: 64 mg/dL — ABNORMAL LOW (ref 70–99)
Glucose-Capillary: 68 mg/dL — ABNORMAL LOW (ref 70–99)
Glucose-Capillary: 108 mg/dL — ABNORMAL HIGH (ref 70–99)
Glucose-Capillary: 129 mg/dL — ABNORMAL HIGH (ref 70–99)
Glucose-Capillary: 164 mg/dL — ABNORMAL HIGH (ref 70–99)
Glucose-Capillary: 167 mg/dL — ABNORMAL HIGH (ref 70–99)
Glucose-Capillary: 151 mg/dL — ABNORMAL HIGH (ref 70–99)

## 2021-10-23 MED ORDER — INSULIN ASPART 100 UNIT/ML IJ SOLN
0.0000 [IU] | Freq: Four times a day (QID) | INTRAMUSCULAR | Status: DC
  Administered 2021-10-23 (×2): 2 [IU] via SUBCUTANEOUS
  Administered 2021-10-24 (×2): 3 [IU] via SUBCUTANEOUS
  Administered 2021-10-24: 2 [IU] via SUBCUTANEOUS
  Administered 2021-10-24: 3 [IU] via SUBCUTANEOUS
  Administered 2021-10-25 – 2021-10-26 (×4): 2 [IU] via SUBCUTANEOUS
  Administered 2021-10-26: 1 [IU] via SUBCUTANEOUS
  Administered 2021-10-26 – 2021-10-27 (×3): 2 [IU] via SUBCUTANEOUS
  Administered 2021-10-27: 3 [IU] via SUBCUTANEOUS
  Administered 2021-10-27: 5 [IU] via SUBCUTANEOUS
  Administered 2021-10-27: 3 [IU] via SUBCUTANEOUS
  Administered 2021-10-27: 5 [IU] via SUBCUTANEOUS
  Administered 2021-10-28 (×2): 3 [IU] via SUBCUTANEOUS
  Administered 2021-10-28 – 2021-10-29 (×2): 1 [IU] via SUBCUTANEOUS
  Administered 2021-10-29 (×3): 3 [IU] via SUBCUTANEOUS
  Administered 2021-10-30: 1 [IU] via SUBCUTANEOUS
  Administered 2021-10-30 – 2021-10-31 (×4): 3 [IU] via SUBCUTANEOUS
  Administered 2021-10-31: 2 [IU] via SUBCUTANEOUS
  Administered 2021-10-31: 3 [IU] via SUBCUTANEOUS
  Administered 2021-11-01: 2 [IU] via SUBCUTANEOUS
  Administered 2021-11-01: 3 [IU] via SUBCUTANEOUS
  Administered 2021-11-01 (×2): 1 [IU] via SUBCUTANEOUS
  Administered 2021-11-02 (×2): 3 [IU] via SUBCUTANEOUS
  Administered 2021-11-02: 2 [IU] via SUBCUTANEOUS
  Administered 2021-11-03: 3 [IU] via SUBCUTANEOUS
  Administered 2021-11-03: 2 [IU] via SUBCUTANEOUS
  Administered 2021-11-03: 1 [IU] via SUBCUTANEOUS
  Administered 2021-11-03: 2 [IU] via SUBCUTANEOUS
  Administered 2021-11-04: 3 [IU] via SUBCUTANEOUS
  Administered 2021-11-04: 2 [IU] via SUBCUTANEOUS
  Administered 2021-11-05 – 2021-11-07 (×7): 3 [IU] via SUBCUTANEOUS
  Administered 2021-11-07: 2 [IU] via SUBCUTANEOUS
  Administered 2021-11-07: 3 [IU] via SUBCUTANEOUS
  Administered 2021-11-07: 5 [IU] via SUBCUTANEOUS
  Administered 2021-11-08: 2 [IU] via SUBCUTANEOUS
  Administered 2021-11-08: 3 [IU] via SUBCUTANEOUS
  Administered 2021-11-08: 1 [IU] via SUBCUTANEOUS
  Administered 2021-11-08: 2 [IU] via SUBCUTANEOUS
  Administered 2021-11-09: 3 [IU] via SUBCUTANEOUS
  Administered 2021-11-09: 1 [IU] via SUBCUTANEOUS
  Administered 2021-11-09: 3 [IU] via SUBCUTANEOUS
  Administered 2021-11-09: 7 [IU] via SUBCUTANEOUS
  Administered 2021-11-10: 1 [IU] via SUBCUTANEOUS
  Administered 2021-11-10: 3 [IU] via SUBCUTANEOUS
  Administered 2021-11-10: 2 [IU] via SUBCUTANEOUS
  Administered 2021-11-10: 5 [IU] via SUBCUTANEOUS
  Administered 2021-11-11: 3 [IU] via SUBCUTANEOUS
  Administered 2021-11-11 – 2021-11-12 (×3): 5 [IU] via SUBCUTANEOUS
  Administered 2021-11-12: 1 [IU] via SUBCUTANEOUS
  Administered 2021-11-12: 3 [IU] via SUBCUTANEOUS
  Administered 2021-11-12: 2 [IU] via SUBCUTANEOUS
  Administered 2021-11-13: 5 [IU] via SUBCUTANEOUS
  Administered 2021-11-13: 1 [IU] via SUBCUTANEOUS
  Administered 2021-11-13 – 2021-11-14 (×2): 2 [IU] via SUBCUTANEOUS
  Administered 2021-11-14: 3 [IU] via SUBCUTANEOUS
  Administered 2021-11-14 – 2021-11-15 (×3): 2 [IU] via SUBCUTANEOUS
  Administered 2021-11-15: 3 [IU] via SUBCUTANEOUS
  Administered 2021-11-15 – 2021-11-16 (×4): 2 [IU] via SUBCUTANEOUS
  Administered 2021-11-16 (×2): 3 [IU] via SUBCUTANEOUS
  Administered 2021-11-17 (×2): 5 [IU] via SUBCUTANEOUS
  Administered 2021-11-18: 3 [IU] via SUBCUTANEOUS
  Administered 2021-11-18: 5 [IU] via SUBCUTANEOUS
  Administered 2021-11-18: 1 [IU] via SUBCUTANEOUS
  Administered 2021-11-18: 3 [IU] via SUBCUTANEOUS
  Administered 2021-11-18: 5 [IU] via SUBCUTANEOUS
  Administered 2021-11-19: 3 [IU] via SUBCUTANEOUS
  Administered 2021-11-19: 2 [IU] via SUBCUTANEOUS
  Administered 2021-11-19: 3 [IU] via SUBCUTANEOUS
  Administered 2021-11-20 (×2): 1 [IU] via SUBCUTANEOUS
  Administered 2021-11-20: 5 [IU] via SUBCUTANEOUS
  Administered 2021-11-20: 3 [IU] via SUBCUTANEOUS
  Administered 2021-11-21 – 2021-11-22 (×3): 2 [IU] via SUBCUTANEOUS
  Administered 2021-11-22: 1 [IU] via SUBCUTANEOUS
  Administered 2021-11-22: 5 [IU] via SUBCUTANEOUS
  Administered 2021-11-23 (×2): 2 [IU] via SUBCUTANEOUS
  Administered 2021-11-23: 3 [IU] via SUBCUTANEOUS
  Administered 2021-11-24: 2 [IU] via SUBCUTANEOUS
  Administered 2021-11-24: 3 [IU] via SUBCUTANEOUS
  Administered 2021-11-24 (×2): 2 [IU] via SUBCUTANEOUS
  Administered 2021-11-24: 3 [IU] via SUBCUTANEOUS
  Administered 2021-11-25: 5 [IU] via SUBCUTANEOUS
  Administered 2021-11-25: 2 [IU] via SUBCUTANEOUS
  Administered 2021-11-26 (×2): 3 [IU] via SUBCUTANEOUS

## 2021-10-23 MED ORDER — POTASSIUM CHLORIDE 20 MEQ PO PACK
60.0000 meq | PACK | Freq: Every day | ORAL | Status: DC
  Administered 2021-10-23 – 2021-10-24 (×2): 60 meq via ORAL
  Filled 2021-10-23 (×2): qty 3

## 2021-10-23 MED ORDER — NICOTINE 21 MG/24HR TD PT24: 21.0000 mg | MEDICATED_PATCH | Freq: Every day | TRANSDERMAL | Status: DC

## 2021-10-23 MED ORDER — INSULIN DETEMIR 100 UNIT/ML ~~LOC~~ SOLN
30.0000 [IU] | Freq: Two times a day (BID) | SUBCUTANEOUS | Status: DC
  Administered 2021-10-23 – 2021-11-26 (×69): 30 [IU] via SUBCUTANEOUS
  Filled 2021-10-23 (×72): qty 0.3

## 2021-10-23 NOTE — Progress Notes (Signed)
PROGRESS NOTE                                                                                                                                                                                                             Patient Demographics:    Joshua Donovan, is a 66 y.o. male, DOB - 1955-12-16, MWU:[id number]  Outpatient Primary MD for the patient is [facility] [place]    LOS - 72  Admit date - 08/03/2021    No chief complaint on file.      Brief Narrative (HPI from H&P)   67 year old with a history of ICH, DM 2, HTN, and HLD who presented to [facility] 7/24 with right hand numbness and weakness and was diagnosed with a small brainstem stroke.  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  He required intubation and was transferred to [facility] for an interventional radiology intervention.  He ultimately required tracheostomy and PEG tube placement, and has subsequently suffered a prolonged hospital stay.   Significant events:  7/24 presented to [facility], ventral medulla CVA 7/25 tx to [facility], treated with Cleviprex 7/26 cerebral angiogram with stent placement to right vertebro-basilar junction, failed extubation, MRI brain> mild extension of stroke, now involving bilateral medial medullary, patent basilar artery and R VBJ stent  7/28 bedside tracheostomy by PCCM and PEG tube placement by General Surgery 7/31 Bleeding around trach site, bright red blood.  Underwent bronchoscopy, Trach Removed, patient orally reintubated, stoma packed. 8/2 ENT took patient to OR for trach redo 8/9 transferred to medical floor on tracheostomy 8/12 vomiting after bolus feeding with worsening hypoxia 8/13 copious bleeding from the heparin injection site controlled with pressure dressing 8/14 bleeding has resolved.  Trach secretions blood-tinged. 8/15 insurance denied LTAC appeal.  CSW working on SNF. 8/28:  peer to peer for LTAC looks like this was denied 8/29: V. tach runs - increased metoprolol  9/2: Slightly increased work of breathing-low-grade temps 99 for chest x-ray showed no interval changes 9/5:  Ancef started 2/2 secretions --no wbc or fever 9/12 completed abx course  Awaiting placement     Subjective:   Patient in bed communicates with head nod and lip movement, he appears comfortable denies any headache chest or abdominal pain, no shortness of breath.   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  Continue supportive care.  Continue PT OT as tolerated, await SNF bed.  Discussed the case with stroke team MD [name] on 10/20/2021 duration of dual antiplatelet therapy is 6 months starting from 08/04/2021 thereafter aspirin alone.  Acute respiratory failure with hypoxia (HCC) along with aspiration pneumonia. Patient required ICU admission and mechanical ventilation after admission secondary to worsening mentation. Concern for possible aspiration pneumonitis. Patient unable to extubate. Tracheostomy placed on 7/31 and revised by ENT on 08/11/21 secondary to bleeding. Currently stable.  PCCM for trach management and he has finished antibiotic treatment for his infections.  Continue supportive care and trach hygiene per RT.  Alcohol abuse - Initially managed on CIWA.  No signs of DTs now, Continue thiamine, multivitamin and folic acid.  Mixed hyperlipidemia - Continue Lipitor.  Hypertension - Continue clonidine, amlodipine, hydrochlorothiazide and Coreg  NSVT (nonsustained ventricular tachycardia) (HCC) EF 60% - Patient with intermittent Vtach. Most recently with 25 beats of vtach on 08/30/21. QTc of 424 msec, -Replete potassium and magnesium, Cardiology consult,  maintain mag >2 and k >4, no additioonal cardiac w/u.  Continue beta-blocker.  Dysphagia - Patient is s/p PEG tube and is on tube feeds via PEG. Continue tube feeds and free water.  Tracheostomy complication (HCC)-resolved as of 08/25/2021 - In setting of DAPT. ENT consulted for revision. Bleeding resolved.  Hypokalemia and hypomagnesemia.  Replaced, stopped HCTZ which likely is causing persistent hypokalemia.    Uncontrolled type 2 diabetes mellitus with hyperglycemia (HCC) - Most recent hemoglobin A1C of 8.3%. uncontrolled with hyper- and hypoglycemia.  Levemir along with NovoLog every 4 hours, dose adjusted on 10/20/2021.    Lab Results  Component Value Date   HGBA1C 8.3 (H) 08/03/2021   CBG (last 3)  Recent Labs    10/23/21 0315 10/23/21 0339 10/23/21 0812  GLUCAP 68* 108* 129*           Condition - Extremely Guarded  Family Communication  : None present  Code Status : Full code  Consults  : ENT, PCCM, neurology  PUD Prophylaxis : Pepcid   Procedures  :            Disposition Plan  :    Status is: Inpatient  DVT Prophylaxis  :     Place TED hose Start: 08/22/21 1546 SCD's Start: 08/03/21 2311   Lab Results  Component Value Date   PLT 308 10/20/2021    Diet :  Diet Order             Diet NPO time specified  Diet effective now                    Inpatient Medications  Scheduled Meds:  amLODipine  10 mg Per Tube Daily   aspirin  81 mg Per Tube Daily   atorvastatin  80 mg Per Tube Daily   carvedilol  37.5 mg Per Tube BID WC   diclofenac Sodium  2 g Topical QID   famotidine  20 mg Per Tube Daily   feeding supplement ([name])  237 mL Per Tube 6 X Daily   feeding supplement (PROSource TF20)  60 mL Per Tube Daily   folic acid  1 mg Per Tube Daily   free water  100 mL Per  Tube Q4H   guaiFENesin  10 mL Per Tube Q4H   insulin aspart  0-9 Units Subcutaneous Q6H   insulin detemir  30 Units Subcutaneous BID   multivitamin with  minerals  1 tablet Per Tube Daily   nicotine  21 mg Transdermal Daily   mouth rinse  15 mL Mouth Rinse Q2H   potassium chloride  60 mEq Oral Daily   thiamine  100 mg Per Tube Daily   ticagrelor  90 mg Per Tube BID   Continuous Infusions:   PRN Meds:.[DISCONTINUED] acetaminophen **OR** acetaminophen (TYLENOL) oral liquid 160 mg/5 mL **OR** [DISCONTINUED] acetaminophen, bisacodyl, hydrALAZINE, labetalol, liver oil-zinc oxide, mouth rinse, polyethylene glycol, sodium chloride     Objective:   Vitals:   10/23/21 0259 10/23/21 0345 10/23/21 0700 10/23/21 0826  BP: 112/78  (!) 155/87 (!) 155/87  Pulse: 80 81 89 84  Resp: (!) 30 (!) 25 (!) 24 (!) 24  Temp: 98.4 F (36.9 C)   99 F (37.2 C)  TempSrc: Oral   Oral  SpO2: 96% 96% 97% 94%  Weight:      Height:        Wt Readings from Last 3 Encounters:  10/04/21 79.8 kg  08/02/21 83.9 kg  02/20/20 88.6 kg     Intake/Output Summary (Last 24 hours) at 10/23/2021 0947 Last data filed at 10/23/2021 4665 Gross per 24 hour  Intake --  Output 2200 ml  Net -2200 ml     Physical Exam  Awake Alert, continues to have functional quadriplegia, PEG tube in place, tracheostomy in place with trach collar, male purewick [place].AT,PERRAL Supple Neck, No JVD,   Symmetrical Chest wall movement, Good air movement bilaterally, CTAB RRR,No Gallops, Rubs or new Murmurs,  +ve B.Sounds, Abd Soft, No tenderness,   No Cyanosis, Clubbing or edema      Data Review:    CBC Recent Labs  Lab 10/20/21 0629  WBC 7.5  HGB 12.4*  HCT 36.7*  PLT 308  MCV 81.4  MCH 27.5  MCHC 33.8  RDW 14.5  LYMPHSABS 1.3  MONOABS 0.7  EOSABS 0.5  BASOSABS 0.1    Electrolytes Recent Labs  Lab 10/20/21 0629 10/21/21 0305 10/22/21 0555 10/23/21 0328  NA 136 140 136 138  K 3.4* 3.1* 3.7 3.4*  CL 101 100 102 103  CO2 '26 27 25 26  '$ GLUCOSE 195* 65* 169* 72  BUN '16 14 11 11  '$ CREATININE 0.55* 0.55* 0.49* 0.50*  CALCIUM 8.9 9.4 8.9 9.1  AST 21  --   --    --   ALT 46*  --   --   --   ALKPHOS 38  --   --   --   BILITOT 0.7  --   --   --   ALBUMIN 2.7*  --   --   --   MG 1.5* 2.0  --   --   BNP 10.9  --   --   --     Signature  Lala Lund M.D on 10/23/2021 at 9:47 AM   -  To page go to [email or web address]

## 2021-10-24 LAB — CBC WITH DIFFERENTIAL/PLATELET
Abs Immature Granulocytes: 0.02 10*3/uL (ref 0.00–0.07)
Basophils Absolute: 0.1 10*3/uL (ref 0.0–0.1)
Basophils Relative: 1 %
Eosinophils Absolute: 0.5 10*3/uL (ref 0.0–0.5)
Eosinophils Relative: 7 %
HCT: 38.6 % — ABNORMAL LOW (ref 39.0–52.0)
Hemoglobin: 13.1 g/dL (ref 13.0–17.0)
Immature Granulocytes: 0 %
Lymphocytes Relative: 18 %
Lymphs Abs: 1.3 10*3/uL (ref 0.7–4.0)
MCH: 27.8 pg (ref 26.0–34.0)
MCHC: 33.9 g/dL (ref 30.0–36.0)
MCV: 82 fL (ref 80.0–100.0)
Monocytes Absolute: 0.7 10*3/uL (ref 0.1–1.0)
Monocytes Relative: 9 %
Neutro Abs: 4.9 10*3/uL (ref 1.7–7.7)
Neutrophils Relative %: 65 %
Platelets: 348 10*3/uL (ref 150–400)
RBC: 4.71 MIL/uL (ref 4.22–5.81)
RDW: 14.6 % (ref 11.5–15.5)
WBC: 7.5 10*3/uL (ref 4.0–10.5)
nRBC: 0 % (ref 0.0–0.2)

## 2021-10-24 LAB — GLUCOSE, CAPILLARY
Glucose-Capillary: 105 mg/dL — ABNORMAL HIGH (ref 70–99)
Glucose-Capillary: 176 mg/dL — ABNORMAL HIGH (ref 70–99)
Glucose-Capillary: 190 mg/dL — ABNORMAL HIGH (ref 70–99)
Glucose-Capillary: 206 mg/dL — ABNORMAL HIGH (ref 70–99)
Glucose-Capillary: 220 mg/dL — ABNORMAL HIGH (ref 70–99)
Glucose-Capillary: 222 mg/dL — ABNORMAL HIGH (ref 70–99)

## 2021-10-24 LAB — BASIC METABOLIC PANEL
Anion gap: 9 (ref 5–15)
BUN: 11 mg/dL (ref 8–23)
CO2: 25 mmol/L (ref 22–32)
Calcium: 9.1 mg/dL (ref 8.9–10.3)
Chloride: 104 mmol/L (ref 98–111)
Creatinine, Ser: 0.56 mg/dL — ABNORMAL LOW (ref 0.61–1.24)
GFR, Estimated: >60 mL/min (ref >60)
Glucose, Bld: 155 mg/dL — ABNORMAL HIGH (ref 70–99)
Potassium: 3.4 mmol/L — ABNORMAL LOW (ref 3.5–5.1)
Sodium: 138 mmol/L (ref 135–145)

## 2021-10-24 LAB — BRAIN NATRIURETIC PEPTIDE: B Natriuretic Peptide: 28.7 pg/mL (ref 0.0–100.0)

## 2021-10-24 MED ORDER — POTASSIUM CHLORIDE 20 MEQ PO PACK
40.0000 meq | PACK | Freq: Once | ORAL | Status: AC
  Administered 2021-10-24: 40 meq via ORAL
  Filled 2021-10-24: qty 2

## 2021-10-24 MED ORDER — FUROSEMIDE 10 MG/ML IJ SOLN
40.0000 mg | Freq: Once | INTRAMUSCULAR | Status: AC
  Administered 2021-10-24: 40 mg via INTRAVENOUS
  Filled 2021-10-24: qty 4

## 2021-10-24 NOTE — Progress Notes (Signed)
PROGRESS NOTE                                                                                                                                                                                                             Patient Demographics:    Joshua Donovan, is a 66 y.o. male, DOB - 11-22-1955, WUJ:[id number]  Outpatient Primary MD for the patient is [facility] [place]    LOS - 95  Admit date - 08/03/2021    No chief complaint on file.      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  He required intubation and was transferred to [facility] for an interventional radiology intervention.  He ultimately required tracheostomy and PEG tube placement, and has subsequently suffered a prolonged hospital stay.   Significant events:  7/24 presented to [facility], ventral medulla CVA 7/25 tx to [facility], treated with Cleviprex 7/26 cerebral angiogram with stent placement to right vertebro-basilar junction, failed extubation, MRI brain> mild extension of stroke, now involving bilateral medial medullary, patent basilar artery and R VBJ stent  7/28 bedside tracheostomy by PCCM and PEG tube placement by General Surgery 7/31 Bleeding around trach site, bright red blood.  Underwent bronchoscopy, Trach Removed, patient orally reintubated, stoma packed. 8/2 ENT took patient to OR for trach redo 8/9 transferred to medical floor on tracheostomy 8/12 vomiting after bolus feeding with worsening hypoxia 8/13 copious bleeding from the heparin injection site controlled with pressure dressing 8/14 bleeding has resolved.  Trach secretions blood-tinged. 8/15 insurance denied LTAC appeal.  CSW working on SNF. 8/28:  peer to peer for LTAC looks like this was denied 8/29: V. tach runs - increased metoprolol  9/2: Slightly increased work of [place] 99 for chest x-ray showed no interval changes 9/5:  Ancef started 2/2 secretions --no wbc or fever 9/12 completed abx course  Awaiting placement     Subjective:   Patient in bed communicates with head nod and lip movement, remains comfortable denies any headache chest or abdominal pain.  No shortness of breath.   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  Continue supportive care.  Continue PT OT as tolerated, await SNF bed.  Discussed the case with stroke team MD [name] on 10/20/2021 duration of dual antiplatelet therapy is 6 months starting from 08/04/2021 thereafter aspirin alone.  Acute respiratory failure with hypoxia (HCC) along with aspiration pneumonia. Patient required ICU admission and mechanical ventilation after admission secondary to worsening mentation. Concern for possible aspiration pneumonitis. Patient unable to extubate. Tracheostomy placed on 7/31 and revised by ENT on 08/11/21 secondary to bleeding. Currently stable.  PCCM for trach management and he has finished antibiotic treatment for his infections.  Continue supportive care and trach hygiene per RT.  Alcohol abuse - Initially managed on CIWA.  No signs of DTs now, Continue thiamine, multivitamin and folic acid.  Mixed hyperlipidemia - Continue Lipitor.  Hypertension - Continue clonidine, amlodipine, hydrochlorothiazide and Coreg  NSVT (nonsustained ventricular tachycardia) (HCC) EF 60% - Patient with intermittent Vtach. Most recently with 25 beats of vtach on 08/30/21. QTc of 424 msec, -Replete potassium and magnesium, Cardiology consult,  maintain mag >2 and k >4, no additioonal cardiac w/u.  Continue beta-blocker.  Dysphagia - Patient is s/p PEG tube and is on tube feeds via PEG. Continue tube feeds and free water.  Tracheostomy complication (HCC)-resolved as of 08/25/2021 - In setting of DAPT. ENT consulted for revision. Bleeding resolved.  Hypokalemia and hypomagnesemia.  Replaced, stopped HCTZ which likely is causing persistent hypokalemia.    Uncontrolled type 2 diabetes mellitus with hyperglycemia (HCC) - Most recent hemoglobin A1C of 8.3%. uncontrolled with hyper- and hypoglycemia.  Levemir along with NovoLog every 4 hours, dose adjusted on 10/20/2021.    Lab Results  Component Value Date   HGBA1C 8.3 (H) 08/03/2021   CBG (last 3)  Recent Labs    10/24/21 0032 10/24/21 0603 10/24/21 0820  GLUCAP 222* 105* 190*           Condition - Extremely Guarded  Family Communication  : None present  Code Status : Full code  Consults  : ENT, PCCM, neurology  PUD Prophylaxis : Pepcid   Procedures  :            Disposition Plan  :    Status is: Inpatient  DVT Prophylaxis  :     Place TED hose Start: 08/22/21 1546 SCD's Start: 08/03/21 2311   Lab Results  Component Value Date   PLT 348 10/24/2021    Diet :  Diet Order             Diet NPO time specified  Diet effective now                    Inpatient Medications  Scheduled Meds:  amLODipine  10 mg Per Tube Daily   aspirin  81 mg Per Tube Daily   atorvastatin  80 mg Per Tube Daily   carvedilol  37.5 mg Per Tube BID WC   diclofenac Sodium  2 g Topical QID   famotidine  20 mg Per Tube Daily   feeding supplement ([name])  237 mL Per Tube 6 X Daily   feeding supplement (PROSource TF20)  60 mL Per Tube Daily   folic acid  1 mg Per Tube Daily   free water  100 mL Per  Tube Q4H   furosemide  40 mg Intravenous Once   guaiFENesin  10 mL Per Tube Q4H   insulin aspart  0-9 Units Subcutaneous Q6H   insulin detemir  30  Units Subcutaneous BID   multivitamin with minerals  1 tablet Per Tube Daily   mouth rinse  15 mL Mouth Rinse Q2H   potassium chloride  40 mEq Oral Once   potassium chloride  60 mEq Oral Daily   thiamine  100 mg Per Tube Daily   ticagrelor  90 mg Per Tube BID   Continuous Infusions:   PRN Meds:.[DISCONTINUED] acetaminophen **OR** acetaminophen (TYLENOL) oral liquid 160 mg/5 mL **OR** [DISCONTINUED] acetaminophen, bisacodyl, hydrALAZINE, labetalol, liver oil-zinc oxide, mouth rinse, polyethylene glycol, sodium chloride     Objective:   Vitals:   10/24/21 0300 10/24/21 0422 10/24/21 0700 10/24/21 0823  BP: (!) 145/88     Pulse: 83 81 85   Resp: (!) 26 (!) 36 (!) 25   Temp: 98.5 F (36.9 C)   98 F (36.7 C)  TempSrc: Oral   Oral  SpO2: 94% 95% 94%   Weight:      Height:        Wt Readings from Last 3 Encounters:  10/04/21 79.8 kg  08/02/21 83.9 kg  02/20/20 88.6 kg     Intake/Output Summary (Last 24 hours) at 10/24/2021 0927 Last data filed at 10/24/2021 0600 Gross per 24 hour  Intake 237 ml  Output 850 ml  Net -613 ml     Physical Exam  Awake Alert, continues to have functional quadriplegia, PEG tube in place, tracheostomy in place with trach collar, male purewick [place].AT,PERRAL Supple Neck, No JVD,   Symmetrical Chest wall movement, Good air movement bilaterally, CTAB RRR,No Gallops, Rubs or new Murmurs,  +ve B.Sounds, Abd Soft, No tenderness,   No Cyanosis, Clubbing or edema       Data Review:    CBC Recent Labs  Lab 10/20/21 0629 10/24/21 0649  WBC 7.5 7.5  HGB 12.4* 13.1  HCT 36.7* 38.6*  PLT 308 348  MCV 81.4 82.0  MCH 27.5 27.8  MCHC 33.8 33.9  RDW 14.5 14.6  LYMPHSABS 1.3 1.3  MONOABS 0.7 0.7  EOSABS 0.5 0.5  BASOSABS 0.1 0.1    Electrolytes Recent Labs  Lab 10/20/21 0629 10/21/21 0305 10/22/21 0555 10/23/21 0328 10/24/21 0649  NA 136 140 136 138 138  K 3.4* 3.1* 3.7 3.4* 3.4*  CL 101 100 102 103 104  CO2 '26 27 25 26 25   '$ GLUCOSE 195* 65* 169* 72 155*  BUN '16 14 11 11 11  '$ CREATININE 0.55* 0.55* 0.49* 0.50* 0.56*  CALCIUM 8.9 9.4 8.9 9.1 9.1  AST 21  --   --   --   --   ALT 46*  --   --   --   --   ALKPHOS 38  --   --   --   --   BILITOT 0.7  --   --   --   --   ALBUMIN 2.7*  --   --   --   --   MG 1.5* 2.0  --   --   --   BNP 10.9  --   --   --  28.7    Signature  Lala Lund M.D on 10/24/2021 at 9:27 AM   -  To page go to [email or web address]

## 2021-10-25 LAB — BASIC METABOLIC PANEL
Anion gap: 9 (ref 5–15)
BUN: 15 mg/dL (ref 8–23)
CO2: 26 mmol/L (ref 22–32)
Calcium: 9 mg/dL (ref 8.9–10.3)
Chloride: 106 mmol/L (ref 98–111)
Creatinine, Ser: 0.55 mg/dL — ABNORMAL LOW (ref 0.61–1.24)
GFR, Estimated: >60 mL/min (ref >60)
Glucose, Bld: 224 mg/dL — ABNORMAL HIGH (ref 70–99)
Potassium: 3.5 mmol/L (ref 3.5–5.1)
Sodium: 141 mmol/L (ref 135–145)

## 2021-10-25 LAB — GLUCOSE, CAPILLARY
Glucose-Capillary: 171 mg/dL — ABNORMAL HIGH (ref 70–99)
Glucose-Capillary: 170 mg/dL — ABNORMAL HIGH (ref 70–99)
Glucose-Capillary: 174 mg/dL — ABNORMAL HIGH (ref 70–99)

## 2021-10-25 MED ORDER — POTASSIUM CHLORIDE 20 MEQ PO PACK
40.0000 meq | PACK | Freq: Two times a day (BID) | ORAL | Status: DC
  Administered 2021-10-25 – 2021-11-26 (×64): 40 meq
  Filled 2021-10-25 (×65): qty 2

## 2021-10-25 NOTE — Progress Notes (Signed)
NAME:  Joshua Donovan, MRN:  [id number], DOB:  [birth year], LOS: 8 ADMISSION DATE:  08/03/2021, CONSULTATION DATE:  7/26 REFERRING MD:  Colvin Caroli FOR CONSULT:  CVA   History of Present Illness:  66 y/o male presented to [facility] on 7/24 with R hand numbness and weakness, found to have small brainstem stroke in ventral medulla.  Symptoms worsened and required transfer to [facility] for neuro IR intervention where he had a stent placed in R vertebrovasilar junction.  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  He had a tracheostomy performed on 7/26 c/b dislodgement and ENT revision on 8/2.  Pertinent  Medical History  TBI with ICH, DM, HTN, HLD  Significant Hospital Events: Including procedures, antibiotic start and stop dates in addition to other pertinent events    7/24 presented to [facility], ventral medulla CVA 7/25 tx to [facility] 7/26 cerebral angiogram with stent placement to right vertebro-basilar junction, failed extubation, left radial aline MRI brain> mild extension of stroke, now involving bilateral medial medullary, patent basilar artery and R VBJ stent  7/28 underwent bedside percutaneous tracheostomy and PEG tube placement, tolerated well 7/29 no acute issues overnight currently on SBT trial this a.m. and tolerating well, Cleviprex resumed earlier this a.m. due to severe hypertension 7/30 Hypertension persist despite adding oral agents, glucose remains elevated as well 7/31 Bleeding around trach site, bright red blood. Copious bloody secretions. Cleviprex off, SBPs 150s-160s. Basal insulin/TF coverage increased. Cefepime deescalated to ceftriaxone. CXR stable. Later in afternoon, continued peritrach bleeding. Bronchoscopic eval completed with intratracheal bleeding associated with trach. Removed, patient orally reintubated, stoma packed. 8/1 ENT consulted for trach revision. Lightly sedated. Vent full support/PRVC, given fatigue following events of yesterday. ENT attempted to  evaluate trach at bedside, could not pass scope. Plan for OR 8/2. ASA/Brilinta held. 8/2 to OR for trach redo 8/6 trach collar during day shift, back on vent overnight 8/10 Hemoptysis, blood with suctioning > improved 8/11 8/14 on 8L / 35% ATC  8/28: Stable vitals, increased tenacious secretions from trach. CXR suggesting new LLL basilar opacities atelectasis vs pna 09/07/2021 Decreased volume and thickness of secretions. No fevers. Tracheal aspirate from 8/28 with mod gram positive cocci and few gram negative rods today, culture pending. 9/5 start cefazolin x 7 days for MSSA pneumonia 9/8 Trach exchange   Interim History / Subjective:   Secretions worse today per nursing. Otherwise stable week.  Objective   Blood pressure (!) 172/94, pulse 89, temperature 98.8 F (37.1 C), temperature source Oral, resp. rate (!) 23, height '5\' 7"'$  (1.702 m), weight 79.8 kg, SpO2 91 %.    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

## 2021-10-25 NOTE — Progress Notes (Signed)
PROGRESS NOTE                                                                                                                                                                                                             Patient Demographics:    Joshua Donovan, is a 66 y.o. male, DOB - June 24, 1955, HGD:[id number]  Outpatient Primary MD for the patient is [facility] [place]    LOS - 96  Admit date - 08/03/2021    No chief complaint on file.      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  He required intubation and was transferred to [facility] for an interventional radiology intervention.  He ultimately required tracheostomy and PEG tube placement, and has subsequently suffered a prolonged hospital stay.   Significant events:  7/24 presented to [facility], ventral medulla CVA 7/25 tx to [facility], treated with Cleviprex 7/26 cerebral angiogram with stent placement to right vertebro-basilar junction, failed extubation, MRI brain> mild extension of stroke, now involving bilateral medial medullary, patent basilar artery and R VBJ stent  7/28 bedside tracheostomy by PCCM and PEG tube placement by General Surgery 7/31 Bleeding around trach site, bright red blood.  Underwent bronchoscopy, Trach Removed, patient orally reintubated, stoma packed. 8/2 ENT took patient to OR for trach redo 8/9 transferred to medical floor on tracheostomy 8/12 vomiting after bolus feeding with worsening hypoxia 8/13 copious bleeding from the heparin injection site controlled with pressure dressing 8/14 bleeding has resolved.  Trach secretions blood-tinged. 8/15 insurance denied LTAC appeal.  CSW working on SNF. 8/28:  peer to peer for LTAC looks like this was denied 8/29: V. tach runs - increased metoprolol  9/2: Slightly increased work of [place] 99 for chest x-ray showed no interval changes 9/5:  Ancef started 2/2 secretions --no wbc or fever 9/12 completed abx course  Awaiting placement     Subjective:   Patient in bed communicates with head nod and lip movement, he remains comfortable denies any headache, no chest or abdominal pain.  Denies any shortness of breath.   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  Continue supportive care.  Continue PT OT as tolerated, await SNF bed.  Discussed the case with stroke team MD [name] on 10/20/2021 duration of dual antiplatelet therapy is 6 months starting from 08/04/2021 thereafter aspirin alone.  Acute respiratory failure with hypoxia (HCC) along with aspiration pneumonia. Patient required ICU admission and mechanical ventilation after admission secondary to worsening mentation. Concern for possible aspiration pneumonitis. Patient unable to extubate. Tracheostomy placed on 7/31 and revised by ENT on 08/11/21 secondary to bleeding. Currently stable.  PCCM for trach management and he has finished antibiotic treatment for his infections.  Continue supportive care and trach hygiene per RT.  Alcohol abuse - Initially managed on CIWA.  No signs of DTs now, Continue thiamine, multivitamin and folic acid.  Mixed hyperlipidemia - Continue Lipitor.  Hypertension - Continue clonidine, amlodipine, hydrochlorothiazide and Coreg  NSVT (nonsustained ventricular tachycardia) (HCC) EF 60% - Patient with intermittent Vtach. Most recently with 25 beats of vtach on 08/30/21. QTc of 424 msec, -Replete potassium and magnesium, Cardiology  consult, maintain mag >2 and k >4, no additioonal cardiac w/u.  Continue beta-blocker.  Dysphagia - Patient is s/p PEG tube and is on tube feeds via PEG. Continue tube feeds and free water.  Tracheostomy complication (HCC)-resolved as of 08/25/2021 - In setting of DAPT. ENT consulted for revision. Bleeding resolved.  Hypokalemia and hypomagnesemia.  Replaced, stopped HCTZ which likely is worsening his hypokalemia, still on scheduled potassium supplementation monitor potassium levels closely.  Uncontrolled type 2 diabetes mellitus with hyperglycemia (HCC) - Most recent hemoglobin A1C of 8.3%. uncontrolled with hyper- and hypoglycemia.  Levemir along with NovoLog every 4 hours, dose adjusted on 10/20/2021.    Lab Results  Component Value Date   HGBA1C 8.3 (H) 08/03/2021   CBG (last 3)  Recent Labs    10/24/21 1810 10/24/21 2342 10/25/21 0601  GLUCAP 176* 220* 171*           Condition - Extremely Guarded  Family Communication  : None present  Code Status : Full code  Consults  : ENT, PCCM, neurology  PUD Prophylaxis : Pepcid   Procedures  :            Disposition Plan  :    Status is: Inpatient  DVT Prophylaxis  :     Place TED hose Start: 08/22/21 1546 SCD's Start: 08/03/21 2311   Lab Results  Component Value Date   PLT 348 10/24/2021    Diet :  Diet Order             Diet NPO time specified  Diet effective now                    Inpatient Medications  Scheduled Meds:  amLODipine  10 mg Per Tube Daily   aspirin  81 mg Per Tube Daily   atorvastatin  80 mg Per Tube Daily   carvedilol  37.5 mg Per Tube BID WC   diclofenac Sodium  2 g Topical QID   famotidine  20 mg Per Tube Daily   feeding supplement ([name])  237 mL Per Tube 6 X Daily   feeding supplement (PROSource TF20)  60 mL Per Tube Daily   folic acid  1 mg Per Tube Daily  free water  100 mL Per Tube Q4H   guaiFENesin  10 mL Per Tube Q4H   insulin aspart  0-9 Units  Subcutaneous Q6H   insulin detemir  30 Units Subcutaneous BID   multivitamin with minerals  1 tablet Per Tube Daily   mouth rinse  15 mL Mouth Rinse Q2H   potassium chloride  40 mEq Per Tube BID   thiamine  100 mg Per Tube Daily   ticagrelor  90 mg Per Tube BID   Continuous Infusions:   PRN Meds:.[DISCONTINUED] acetaminophen **OR** acetaminophen (TYLENOL) oral liquid 160 mg/5 mL **OR** [DISCONTINUED] acetaminophen, bisacodyl, hydrALAZINE, labetalol, liver oil-zinc oxide, mouth rinse, polyethylene glycol, sodium chloride     Objective:   Vitals:   10/24/21 1954 10/24/21 2340 10/25/21 0303 10/25/21 0332  BP: (!) 155/77 (!) 158/85  (!) 150/85  Pulse: 90 92 81 84  Resp: (!) 27 (!) 29 (!) 24 (!) 29  Temp:  98.3 F (36.8 C)  98.5 F (36.9 C)  TempSrc:  Oral  Oral  SpO2: 93% 95% 91% 93%  Weight:      Height:        Wt Readings from Last 3 Encounters:  10/04/21 79.8 kg  08/02/21 83.9 kg  02/20/20 88.6 kg     Intake/Output Summary (Last 24 hours) at 10/25/2021 0828 Last data filed at 10/24/2021 2346 Gross per 24 hour  Intake --  Output 1900 ml  Net -1900 ml     Physical Exam  Awake Alert, continues to have functional quadriplegia, PEG tube in place, tracheostomy in place with trach collar, male purewick [place].AT,PERRAL Supple Neck, No JVD,   Symmetrical Chest wall movement, Good air movement bilaterally, CTAB RRR,No Gallops, Rubs or new Murmurs,  +ve B.Sounds, Abd Soft, No tenderness,   No Cyanosis, Clubbing or edema        Data Review:    CBC Recent Labs  Lab 10/20/21 0629 10/24/21 0649  WBC 7.5 7.5  HGB 12.4* 13.1  HCT 36.7* 38.6*  PLT 308 348  MCV 81.4 82.0  MCH 27.5 27.8  MCHC 33.8 33.9  RDW 14.5 14.6  LYMPHSABS 1.3 1.3  MONOABS 0.7 0.7  EOSABS 0.5 0.5  BASOSABS 0.1 0.1    Electrolytes Recent Labs  Lab 10/20/21 0629 10/21/21 0305 10/22/21 0555 10/23/21 0328 10/24/21 0649 10/25/21 0656  NA 136 140 136 138 138 141  K 3.4* 3.1* 3.7 3.4*  3.4* 3.5  CL 101 100 102 103 104 106  CO2 '26 27 25 26 25 26  '$ GLUCOSE 195* 65* 169* 72 155* 224*  BUN '16 14 11 11 11 15  '$ CREATININE 0.55* 0.55* 0.49* 0.50* 0.56* 0.55*  CALCIUM 8.9 9.4 8.9 9.1 9.1 9.0  AST 21  --   --   --   --   --   ALT 46*  --   --   --   --   --   ALKPHOS 38  --   --   --   --   --   BILITOT 0.7  --   --   --   --   --   ALBUMIN 2.7*  --   --   --   --   --   MG 1.5* 2.0  --   --   --   --   BNP 10.9  --   --   --  28.7  --     Signature  Lala Lund M.D on 10/25/2021 at 8:28  AM   -  To page go to [email or web address]

## 2021-10-26 LAB — BASIC METABOLIC PANEL
Anion gap: 10 (ref 5–15)
BUN: 15 mg/dL (ref 8–23)
CO2: 25 mmol/L (ref 22–32)
Calcium: 9.4 mg/dL (ref 8.9–10.3)
Chloride: 105 mmol/L (ref 98–111)
Creatinine, Ser: 0.57 mg/dL — ABNORMAL LOW (ref 0.61–1.24)
GFR, Estimated: >60 mL/min (ref >60)
Glucose, Bld: 123 mg/dL — ABNORMAL HIGH (ref 70–99)
Potassium: 3.6 mmol/L (ref 3.5–5.1)
Sodium: 140 mmol/L (ref 135–145)

## 2021-10-26 LAB — GLUCOSE, CAPILLARY
Glucose-Capillary: 194 mg/dL — ABNORMAL HIGH (ref 70–99)
Glucose-Capillary: 122 mg/dL — ABNORMAL HIGH (ref 70–99)
Glucose-Capillary: 167 mg/dL — ABNORMAL HIGH (ref 70–99)
Glucose-Capillary: 150 mg/dL — ABNORMAL HIGH (ref 70–99)
Glucose-Capillary: 263 mg/dL — ABNORMAL HIGH (ref 70–99)

## 2021-10-26 LAB — BRAIN NATRIURETIC PEPTIDE: B Natriuretic Peptide: 15.9 pg/mL (ref 0.0–100.0)

## 2021-10-26 LAB — MAGNESIUM: Magnesium: 1.8 mg/dL (ref 1.7–2.4)

## 2021-10-26 MED ORDER — FUROSEMIDE 10 MG/ML IJ SOLN
40.0000 mg | Freq: Once | INTRAMUSCULAR | Status: AC
  Administered 2021-10-26: 40 mg via INTRAVENOUS
  Filled 2021-10-26: qty 4

## 2021-10-26 MED ORDER — SCOPOLAMINE 1 MG/3DAYS TD PT72
1.0000 | MEDICATED_PATCH | TRANSDERMAL | Status: DC
  Administered 2021-10-26 – 2021-11-25 (×11): 1.5 mg via TRANSDERMAL
  Filled 2021-10-26 (×11): qty 1

## 2021-10-26 NOTE — Progress Notes (Signed)
PROGRESS NOTE                                                                                                                                                                                                             Patient Demographics:    Joshua Donovan, is a 66 y.o. male, DOB - 10/14/1955, JME:[id number]  Outpatient Primary MD for the patient is [facility] [place]    LOS - 22  Admit date - 08/03/2021    No chief complaint on file.      Brief Narrative (HPI from H&P)   66 year old with a history of ICH, DM 2, HTN, and HLD who presented to [facility] 7/24 with right hand numbness and weakness and was diagnosed with a small brainstem stroke.  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  He required intubation and was transferred to [facility] for an interventional radiology intervention.  He ultimately required tracheostomy and PEG tube placement, and has subsequently suffered a prolonged hospital stay.   Significant events:  7/24 presented to [facility], ventral medulla CVA 7/25 tx to [facility], treated with Cleviprex 7/26 cerebral angiogram with stent placement to right vertebro-basilar junction, failed extubation, MRI brain> mild extension of stroke, now involving bilateral medial medullary, patent basilar artery and R VBJ stent  7/28 bedside tracheostomy by PCCM and PEG tube placement by General Surgery 7/31 Bleeding around trach site, bright red blood.  Underwent bronchoscopy, Trach Removed, patient orally reintubated, stoma packed. 8/2 ENT took patient to OR for trach redo 8/9 transferred to medical floor on tracheostomy 8/12 vomiting after bolus feeding with worsening hypoxia 8/13 copious bleeding from the heparin injection site controlled with pressure dressing 8/14 bleeding has resolved.  Trach secretions blood-tinged. 8/15 insurance denied LTAC appeal.  CSW working on SNF. 8/28:  peer to peer for LTAC looks like this was denied 8/29: V. 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  Continue supportive care.  Continue PT OT as tolerated, await SNF bed.  Discussed the case with stroke team MD [name] on 10/20/2021 duration of dual antiplatelet therapy is 6 months starting from 08/04/2021 thereafter aspirin alone.  Acute respiratory failure with hypoxia (HCC) along with aspiration pneumonia. Patient required ICU admission and mechanical ventilation after admission secondary to worsening mentation. Concern for possible aspiration pneumonitis. Patient unable to extubate. Tracheostomy placed on 7/31 and revised by ENT on 08/11/21 secondary to bleeding. Currently stable.  PCCM for trach management and he has finished antibiotic treatment for his infections.  Continue supportive care and trach hygiene per RT, his trach secretions continue to be persistently high, repeat trach aspiration and cultures done by PCCM on 10/25/2021, trial of low-dose Lasix and scopolamine patch started on 10/26/2021.  PCCM monitoring.  Alcohol abuse - Initially managed on CIWA.  No signs of DTs now, Continue thiamine, multivitamin and folic acid.  Mixed hyperlipidemia - Continue Lipitor.  Hypertension - Continue clonidine, amlodipine,  hydrochlorothiazide and Coreg  NSVT (nonsustained ventricular tachycardia) (HCC) EF 60% - Patient with intermittent Vtach. Most recently with 25 beats of vtach on 08/30/21. QTc of 424 msec, -Replete potassium and magnesium, Cardiology consult, maintain mag >2 and k >4, no additioonal cardiac w/u.  Continue beta-blocker.  Dysphagia - Patient is s/p PEG tube and is on tube feeds via PEG. Continue tube feeds and free water.  Tracheostomy complication (HCC)-resolved as of 08/25/2021 - In setting of DAPT. ENT consulted for revision. Bleeding resolved.  Hypokalemia and hypomagnesemia.  Replaced, stopped HCTZ which likely is worsening his hypokalemia, still on scheduled potassium supplementation monitor potassium levels closely.  Uncontrolled type 2 diabetes mellitus with hyperglycemia (HCC) - Most recent hemoglobin A1C of 8.3%. uncontrolled with hyper- and hypoglycemia.  Levemir along with NovoLog every 4 hours, dose adjusted on 10/20/2021.    Lab Results  Component Value Date   HGBA1C 8.3 (H) 08/03/2021   CBG (last 3)  Recent Labs    10/25/21 1615 10/26/21 0108 10/26/21 0644  GLUCAP 174* 194* 122*           Condition - Extremely Guarded  Family Communication  : None present  Code Status : Full code  Consults  : ENT, PCCM, neurology  PUD Prophylaxis : Pepcid   Procedures  :            Disposition Plan  :    Status is: Inpatient  DVT Prophylaxis  :     Place TED hose Start: 08/22/21 1546 SCD's Start: 08/03/21 2311   Lab Results  Component Value Date   PLT 348 10/24/2021    Diet :  Diet Order             Diet NPO time specified  Diet effective now                    Inpatient Medications  Scheduled Meds:  amLODipine  10 mg Per Tube Daily   aspirin  81 mg Per Tube Daily   atorvastatin  80 mg Per Tube Daily   carvedilol  37.5 mg Per Tube BID WC   diclofenac Sodium  2 g Topical QID   famotidine  20 mg Per Tube Daily  feeding supplement (JEVITY  1.5 CAL/FIBER)  237 mL Per Tube 6 X Daily   feeding supplement (PROSource TF20)  60 mL Per Tube Daily   folic acid  1 mg Per Tube Daily   free water  100 mL Per Tube Q4H   guaiFENesin  10 mL Per Tube Q4H   insulin aspart  0-9 Units Subcutaneous Q6H   insulin detemir  30 Units Subcutaneous BID   multivitamin with minerals  1 tablet Per Tube Daily   mouth rinse  15 mL Mouth Rinse Q2H   potassium chloride  40 mEq Per Tube BID   scopolamine  1 patch Transdermal Q72H   thiamine  100 mg Per Tube Daily   ticagrelor  90 mg Per Tube BID   Continuous Infusions:   PRN Meds:.[DISCONTINUED] acetaminophen **OR** acetaminophen (TYLENOL) oral liquid 160 mg/5 mL **OR** [DISCONTINUED] acetaminophen, bisacodyl, hydrALAZINE, labetalol, liver oil-zinc oxide, mouth rinse, polyethylene glycol, sodium chloride     Objective:   Vitals:   10/26/21 0200 10/26/21 0337 10/26/21 0734 10/26/21 0745  BP:  (!) 147/86 (!) 154/79 (!) 154/79  Pulse:  94 89 90  Resp: (!) 22 20 (!) 24 (!) 24  Temp:  98.8 F (37.1 C) 97.9 F (36.6 C)   TempSrc:  Oral Oral   SpO2:  92% 93% 94%  Weight:      Height:        Wt Readings from Last 3 Encounters:  10/04/21 79.8 kg  08/02/21 83.9 kg  02/20/20 88.6 kg     Intake/Output Summary (Last 24 hours) at 10/26/2021 9675 Last data filed at 10/25/2021 2300 Gross per 24 hour  Intake --  Output 800 ml  Net -800 ml     Physical Exam  Awake Alert, continues to have functional quadriplegia, PEG tube in place, tracheostomy in place with trach collar, increased trach secretions, male purewick .AT,PERRAL Supple Neck, No JVD,   Symmetrical Chest wall movement, Good air movement bilaterally, coarse bilateral breath sounds today RRR,No Gallops, Rubs or new Murmurs,  +ve B.Sounds, Abd Soft, No tenderness,   No Cyanosis, Clubbing or edema       Data Review:    CBC Recent Labs  Lab 10/20/21 0629 10/24/21 0649  WBC 7.5 7.5  HGB 12.4* 13.1  HCT 36.7* 38.6*  PLT  308 348  MCV 81.4 82.0  MCH 27.5 27.8  MCHC 33.8 33.9  RDW 14.5 14.6  LYMPHSABS 1.3 1.3  MONOABS 0.7 0.7  EOSABS 0.5 0.5  BASOSABS 0.1 0.1    Electrolytes Recent Labs  Lab 10/20/21 0629 10/21/21 0305 10/22/21 0555 10/23/21 0328 10/24/21 0649 10/25/21 0656 10/26/21 0346  NA 136 140 136 138 138 141 140  K 3.4* 3.1* 3.7 3.4* 3.4* 3.5 3.6  CL 101 100 102 103 104 106 105  CO2 '26 27 25 26 25 26 25  '$ GLUCOSE 195* 65* 169* 72 155* 224* 123*  BUN '16 14 11 11 11 15 15  '$ CREATININE 0.55* 0.55* 0.49* 0.50* 0.56* 0.55* 0.57*  CALCIUM 8.9 9.4 8.9 9.1 9.1 9.0 9.4  AST 21  --   --   --   --   --   --   ALT 46*  --   --   --   --   --   --   ALKPHOS 38  --   --   --   --   --   --   BILITOT 0.7  --   --   --   --   --   --  ALBUMIN 2.7*  --   --   --   --   --   --   MG 1.5* 2.0  --   --   --   --  1.8  BNP 10.9  --   --   --  28.7  --   --     Signature  Lala Lund M.D on 10/26/2021 at 9:42 AM   -  To page go to [email or web address]

## 2021-10-26 NOTE — Progress Notes (Addendum)
Speech Language Pathology Treatment: [name] Speaking valve  Patient Details Name: Joshua Donovan MRN: [id number] DOB: 08-12-1955 Today's Date: 10/26/2021 Time: 1150-1200 SLP Time Calculation (min) (ACUTE ONLY): 10 min  Assessment / Plan / Recommendation Clinical Impression  Pt completed 10 reps of EMST set to 7 cm H20 with less effort, will increase pressure next session. Pt phonating but talking too fast, poorly intelligible to me. Did not improve with cues. Will need to provide more structured speech tasks- repetition instead of open ended communication.   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 He  presented 08/02/21 with R foot pain and R-sided weakness. Transferred to [facility] 7/25. 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 S/p stent placement to the R vertebrobasilar junction 7/26, failed extubation. Trach and PEG placed 7/28. PMH: DM, GERD, TBI with prior ICH, HLD, HTN      SLP Plan  Continue with current plan of care      Recommendations for follow up therapy are one component of a multi-disciplinary discharge planning process, led by the attending physician.  Recommendations may be updated based on patient status, additional functional criteria and insurance authorization.    Recommendations         Patient may use Passy-Muir Speech Valve: During all waking hours (remove during sleep)         Plan: Continue with current plan of care           Shirely Toren, [name]  10/26/2021, 12:15 PM

## 2021-10-26 NOTE — Progress Notes (Signed)
Nutrition Follow-up  DOCUMENTATION CODES:   Not applicable  INTERVENTION:   Continue TF via PEG:  6 cartons Jevity 1.5/day (2133kcal, 91g protein, 103m free water) Prosource TF20 q day (80kcal, 20g protein) 1041mFWF q 4hrs (6006mree water)  DAILY NUTRIENT TOTAL: 2213kcal, 111g protein, 1681m40mee water  MVI with minerals 1 tablet daily via tube.   100 ml free water every 4 hours Total free water: 2281 ml  NUTRITION DIAGNOSIS:   Inadequate oral intake related to acute illness as evidenced by NPO status.  Ongoing   GOAL:   Patient will meet greater than or equal to 90% of their needs  Met with TF via PEG  MONITOR:   Vent status, Labs, Weight trends, TF tolerance  REASON FOR ASSESSMENT:   Consult Enteral/tube feeding initiation and management  ASSESSMENT:   66 y37male admitted post brain stem stroke involving ventral medulla requiring stent placement to right vertebro-basilar junction. PMH includes HTN, TBI, DM, HLD  [place]  Labs reviewed CBG: 122-174  Medications reviewed and include pepcid, folic acid, Novolog, Levemir, MVI with minerals, Klor-con, thiamine.   Diet Order:   Diet Order             Diet NPO time specified  Diet effective now                   EDUCATION NEEDS:   Not appropriate for education at this time  Skin:  Skin Assessment: Skin Integrity Issues: Skin Integrity Issues:: Other (Comment) Other: non-pressure wound bilateral buttock  Last BM:  10/15  Height:   Ht Readings from Last 1 Encounters:  08/04/21 5' 7"  (1.702 m)    Weight:   Wt Readings from Last 1 Encounters:  10/04/21 79.8 kg    BMI:  Body mass index is 27.57 kg/m.  Estimated Nutritional Needs:   Kcal:  2000-2200 kcals  Protein:  100-115 g  Fluid:  >/= 2 L  Sherylann Vangorden P., RD, LDN, CNSC See AMiON for contact information

## 2021-10-26 NOTE — Progress Notes (Signed)
Occupational Therapy Treatment Patient Details Name: Joshua Donovan MRN: [id number] DOB: 27-Nov-1955 Today's Date: 10/26/2021   History of present illness Pt is a 66 y.o. male who presented 08/02/21 with R foot pain and R-sided weakness. Transferred to [facility] 7/25. 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 S/p stent placement to the R vertebrobasilar junction 7/26, failed extubation. Trach and PEG placed 7/28. S/p bronchoscopic evaluation, oral reintubation and tracheostomy removal with stoma packing 7/31 due to trach site bleeding. S/p trach revision 8/2. PMH: DM, GERD, TBI with prior ICH, HLD, HTN   OT comments  Pt currently still needing total assist +2 for rolling in bed during peri care as well as for sidelying to sit and transfer squat pivot to the tilt in space wheelchair.  Vitals stable on 28% medical air via trach.  Working on head control and AROM while supported sitting EOB.  Pt able to initiate and complete cervical extension from flexed position with mod assist and maintain for 15-30 seconds.  Still with limited ability for active cervical rotation to either side.  Pt will continue to benefit from acute care OT at this time to continue progression with head control for greater use with AE for communication and selfcare.      Recommendations for follow up therapy are one component of a multi-disciplinary discharge planning process, led by the attending physician.  Recommendations may be updated based on patient status, additional functional criteria and insurance authorization.    Follow Up Recommendations  Skilled nursing-short  term rehab (<3 hours/day)    Assistance Recommended at Discharge Frequent or constant Supervision/Assistance  Patient can return home with the following  Two people to help with walking and/or transfers;Two people to help with bathing/dressing/bathroom;Assistance with cooking/housework;Assistance with feeding;Direct supervision/assist for medications management;Direct supervision/assist for financial management;Assist for transportation;Help with stairs or ramp for entrance   Equipment Recommendations  Other (comment) (TBD next venue of care)       Precautions / Restrictions Precautions Precautions: Fall Precaution Comments: trach collar, PEG, SBP < 180, abdominal binder, PMV on during waking hours if O2 sats are good Restrictions Weight Bearing Restrictions: No Other Position/Activity Restrictions: splint on LUE to manage lines       Mobility Bed Mobility Overal bed mobility: Needs Assistance Bed Mobility: Rolling Rolling: Total assist, +2 for physical assistance Sidelying to sit: Total assist, +2 for physical assistance       General bed mobility comments: Assist for all aspects of bed mobility and supine to sit secondary to not being able to demonstrate any AROM in the extremities    Transfers                         Balance Overall balance assessment: Needs assistance   Sitting balance-Leahy Scale: Zero Sitting balance - Comments: Total assist for static sitting balance.                                   ADL either performed or assessed with clinical judgement   ADL Overall ADL's : Needs assistance/impaired  Toileting- Clothing Manipulation and Hygiene: Total assistance;Bed level;+2 for safety/equipment Toileting - Clothing Manipulation Details (indicate cue type and reason): total assist for cleaning up buttocks and applying more barrier cream.     Functional mobility during ADLs: Total assistance  (for supine to sit) General ADL Comments: Pt sat EOB for 15 mins during session with O2 sats at 90-96% on 28% trach collar medical air.  Less secretions noted this session compared to previous session.  Total assist for static sitting balance while working on holding head up and at midline.  Mod assist for initiation of cervical extension and then he could maintain for 15-30 second intervals with slight head tilt to the right.  Total + 2 for squat pivot transfer to the tilt in space wheelchair at completion of session.               Cognition Arousal/Alertness: Awake/alert Behavior During Therapy: WFL for tasks assessed/performed Overall Cognitive Status: Difficult to assess                         Following Commands: Follows one step commands with increased time                      General Comments SpO2 briefly 90% during seated activities and transfer to EOB, however maintained >95% majority of visit on medical hair.    Pertinent Vitals/ Pain       Pain Assessment Pain Assessment: Faces Faces Pain Scale: No hurt         Frequency  Min 2X/week        Progress Toward Goals  OT Goals(current goals can now be found in the care plan section)  Progress towards OT goals: Progressing toward goals  Acute Rehab OT Goals Patient Stated Goal: Pt did not state Time For Goal Achievement: 11/02/21 Potential to Achieve Goals: [place] Discharge plan remains appropriate    Co-evaluation    PT/OT/SLP Co-Evaluation/Treatment: Yes Reason for Co-Treatment: Complexity of the patient's impairments (multi-system involvement);For patient/therapist safety PT goals addressed during session: Mobility/safety with mobility;Balance;Strengthening/ROM OT goals addressed during session: ADL's and self-care;Strengthening/ROM      AM-PAC OT "6 Clicks" Daily Activity     Outcome Measure   Help from another person eating meals?: Total Help from another person taking care of  personal grooming?: Total Help from another person toileting, which includes using toliet, bedpan, or urinal?: Total Help from another person bathing (including washing, rinsing, drying)?: Total Help from another person to put on and taking off regular upper body clothing?: Total Help from another person to put on and taking off regular lower body clothing?: Total 6 Click Score: 6    End of Session Equipment Utilized During Treatment: Oxygen  OT Visit Diagnosis: Other symptoms and signs involving the nervous system (R29.898);Muscle weakness (generalized) (M62.81);Feeding difficulties (R63.3);Other abnormalities of gait and mobility (R26.89);Unsteadiness on feet (R26.81);Other (comment)   Activity Tolerance Patient tolerated treatment well   Patient Left with call bell/phone within reach;in chair   Nurse Communication Mobility status;Need for lift equipment        Time: [phone] OT Time Calculation (min): 41 min  Charges: OT General Charges $OT Visit: 1 Visit OT Treatments $Self Care/Home Management : 8-22 mins $Therapeutic Activity: 8-22 mins  Tayari Yankee OTR/L 10/26/2021, 12:47 PM

## 2021-10-26 NOTE — Progress Notes (Signed)
Physical Therapy Treatment Patient Details Name: Joshua Donovan MRN: [id number] DOB: January 18, 1955 Today's Date: 10/26/2021   History of Present Illness Pt is a 66 y.o. male who presented 08/02/21 with R foot pain and R-sided weakness. Transferred to [facility] 7/25. 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 S/p stent placement to the R vertebrobasilar junction 7/26, failed extubation. Trach and PEG placed 7/28. S/p bronchoscopic evaluation, oral reintubation and tracheostomy removal with stoma packing 7/31 due to trach site bleeding. S/p trach revision 8/2. PMH: DM, GERD, TBI with prior ICH, HLD, HTN    PT Comments    Tolerated treatment well, focused on seated balance at EOB with cervical extension and rotation as tolerated. Total assist +2 for squat pivot transfer with bil knee block, and positioning in tilt-in-space w/c. Requested RN provided tilt every 30-45 minutes for pressure relief >3 minutes. Total +2 for bed mobility training, rolling and sidelying to sit transitions. Patient will continue to benefit from skilled physical therapy services to further improve independence with functional mobility.    Recommendations for follow up therapy are one component of a multi-disciplinary discharge planning process, led by the attending physician.  Recommendations may be updated based on patient status, additional functional criteria and insurance authorization.  Follow Up Recommendations  Skilled nursing-short term rehab (<3 hours/day) Can patient physically be transported by private vehicle: No   Assistance Recommended at Discharge Frequent  or constant Supervision/Assistance  Patient can return home with the following Assistance with cooking/housework;Two people to help with walking and/or transfers;Two people to help with bathing/dressing/bathroom;Assistance with feeding;Direct supervision/assist for medications management;Direct supervision/assist for financial management;Assist for transportation;Help with stairs or ramp for entrance   Equipment Recommendations  Other (comment) (TBA)    Recommendations for Other Services       Precautions / Restrictions Precautions Precautions: Fall Precaution Comments: trach collar, PEG, SBP < 180, abdominal binder, PMV on during waking hours if O2 sats are good Restrictions Weight Bearing Restrictions: No Other Position/Activity Restrictions: splint on LUE to manage lines     Mobility  Bed Mobility Overal bed mobility: Needs Assistance Bed Mobility: Rolling Rolling: Total assist, +2 for physical assistance Sidelying to sit: Total assist, +2 for physical assistance       General bed mobility comments: Assist for all aspects of bed mobility and supine to sit. Cues to facilitate Rt knee flexion.    Transfers Overall transfer level: Needs assistance   Transfers: Bed to chair/wheelchair/BSC       Squat pivot transfers: Total assist, +2 physical assistance     General transfer comment: Squat pivot transfer with bil knee block, total assist with +2 to stablize w/c and guard. Pt with difficulty keeping head extended independently, rested on therapist shoulder during transfer.    Ambulation/Gait               General Gait Details: unable   Stairs             Wheelchair Mobility    Modified Rankin (Stroke Patients Only) Modified Rankin (Stroke Patients Only) Pre-Morbid Rankin Score: No symptoms Modified Rankin: Severe disability     Balance Overall balance assessment: Needs assistance Sitting-balance support: Feet supported Sitting  balance-Leahy  Scale: Zero Sitting balance - Comments: Total assist for static sitting balance. Postural control: Left lateral lean, Posterior lean     Standing balance comment: unable                            Cognition Arousal/Alertness: Awake/alert Behavior During Therapy: WFL for tasks assessed/performed Overall Cognitive Status: Difficult to assess                                 General Comments: PMSV in place, pt vocalizing yes and no in a soft voice to simple questioning.        Exercises General Exercises - Lower Extremity Ankle Circles/Pumps: PROM, Both, 10 reps, Seated (Passive stretch) Long Arc Quad: PROM, Both, 5 reps, Seated    General Comments General comments (skin integrity, edema, etc.): SpO2 briefly 90% during seated activities and transfer to EOB, however maintained >95% majority of visit on medical hair.      Pertinent Vitals/Pain Pain Assessment Pain Assessment: Faces Faces Pain Scale: Hurts little more Pain Location: grimaced with left cervical rotation, but otherwise no issues reported. Pain Descriptors / Indicators: Grimacing Pain Intervention(s): Monitored during session, Repositioned    Home Living                          Prior Function            PT Goals (current goals can now be found in the care plan section) Acute Rehab PT Goals Patient Stated Goal: agreeable to session PT Goal Formulation: With patient/family Time For Goal Achievement: 10/28/21 Potential to Achieve Goals: Fair Progress towards PT goals: Progressing toward goals    Frequency    Min 2X/week      PT Plan Current plan remains appropriate    Co-evaluation PT/OT/SLP Co-Evaluation/Treatment: Yes Reason for Co-Treatment: Complexity of the patient's impairments (multi-system involvement);For patient/therapist safety;To address functional/ADL transfers PT goals addressed during session: Mobility/safety with  mobility;Balance;Strengthening/ROM        AM-PAC PT "6 Clicks" Mobility   Outcome Measure  Help needed turning from your back to your side while in a flat bed without using bedrails?: Total Help needed moving from lying on your back to sitting on the side of a flat bed without using bedrails?: Total Help needed moving to and from a bed to a chair (including a wheelchair)?: Total Help needed standing up from a chair using your arms (e.g., wheelchair or bedside chair)?: Total Help needed to walk in hospital room?: Total Help needed climbing 3-5 steps with a railing? : Total 6 Click Score: 6    End of Session Equipment Utilized During Treatment: Other (comment) (Medical air) Activity Tolerance: Patient tolerated treatment well Patient left: in chair;with call bell/phone within reach Nurse Communication: Need for lift equipment;Mobility status PT Visit Diagnosis: Muscle weakness (generalized) (M62.81);Other symptoms and signs involving the nervous system (R29.898);Other abnormalities of gait and mobility (R26.89);Other (comment) Hemiplegia - Right/Left: Right Hemiplegia - dominant/non-dominant: Dominant Hemiplegia - caused by: Cerebral infarction     Time: 7322-0254 PT Time Calculation (min) (ACUTE ONLY): 42 min  Charges:  $Therapeutic Activity: 8-22 mins                     Candie Mile, PT, DPT Physical Therapist Acute Rehabilitation Services [facility]  [place] 10/26/2021, 10:16 AM

## 2021-10-27 LAB — CBC WITH DIFFERENTIAL/PLATELET
Abs Immature Granulocytes: 0.04 10*3/uL (ref 0.00–0.07)
Basophils Absolute: 0.1 10*3/uL (ref 0.0–0.1)
Basophils Relative: 1 %
Eosinophils Absolute: 0.4 10*3/uL (ref 0.0–0.5)
Eosinophils Relative: 5 %
HCT: 42.8 % (ref 39.0–52.0)
Hemoglobin: 14 g/dL (ref 13.0–17.0)
Immature Granulocytes: 1 %
Lymphocytes Relative: 18 %
Lymphs Abs: 1.5 10*3/uL (ref 0.7–4.0)
MCH: 27.1 pg (ref 26.0–34.0)
MCHC: 32.7 g/dL (ref 30.0–36.0)
MCV: 82.9 fL (ref 80.0–100.0)
Monocytes Absolute: 0.7 10*3/uL (ref 0.1–1.0)
Monocytes Relative: 9 %
Neutro Abs: 5.6 10*3/uL (ref 1.7–7.7)
Neutrophils Relative %: 66 %
Platelets: 373 10*3/uL (ref 150–400)
RBC: 5.16 MIL/uL (ref 4.22–5.81)
RDW: 14.9 % (ref 11.5–15.5)
WBC: 8.3 10*3/uL (ref 4.0–10.5)
nRBC: 0 % (ref 0.0–0.2)

## 2021-10-27 LAB — MAGNESIUM: Magnesium: 1.8 mg/dL (ref 1.7–2.4)

## 2021-10-27 LAB — GLUCOSE, CAPILLARY
Glucose-Capillary: 101 mg/dL — ABNORMAL HIGH (ref 70–99)
Glucose-Capillary: 154 mg/dL — ABNORMAL HIGH (ref 70–99)
Glucose-Capillary: 222 mg/dL — ABNORMAL HIGH (ref 70–99)

## 2021-10-27 LAB — COMPREHENSIVE METABOLIC PANEL
ALT: 47 U/L — ABNORMAL HIGH (ref 0–44)
AST: 28 U/L (ref 15–41)
Albumin: 3 g/dL — ABNORMAL LOW (ref 3.5–5.0)
Alkaline Phosphatase: 41 U/L (ref 38–126)
Anion gap: 10 (ref 5–15)
BUN: 22 mg/dL (ref 8–23)
CO2: 26 mmol/L (ref 22–32)
Calcium: 9.7 mg/dL (ref 8.9–10.3)
Chloride: 104 mmol/L (ref 98–111)
Creatinine, Ser: 0.6 mg/dL — ABNORMAL LOW (ref 0.61–1.24)
GFR, Estimated: >60 mL/min (ref >60)
Glucose, Bld: 188 mg/dL — ABNORMAL HIGH (ref 70–99)
Potassium: 3.9 mmol/L (ref 3.5–5.1)
Sodium: 140 mmol/L (ref 135–145)
Total Bilirubin: 0.7 mg/dL (ref 0.3–1.2)
Total Protein: 6.6 g/dL (ref 6.5–8.1)

## 2021-10-27 NOTE — TOC Progression Note (Signed)
Transition of Care [facility]) - Progression Note    Patient Details  Name: Joshua Donovan MRN: [id number] Date of Birth: 08/12/1955  [facility] CM/SW [place],  Phone Number: 10/27/2021, 3:49 PM  Clinical Narrative:     Sent  SNF referrals  to [place] and [place], MSW, LCSW Clinical Social Worker    Expected Discharge Plan: Skilled Nursing Facility Barriers to Discharge: SNF Pending bed offer  Expected Discharge Plan and Services Expected Discharge Plan: [place]   Discharge Planning Services: CM Consult Post Acute Care Choice: Long Term Acute Care (LTAC) Living arrangements for the past 2 months: Single Family Home                                       Social Determinants of Health (SDOH) Interventions    Readmission Risk Interventions     No data to display

## 2021-10-27 NOTE — Progress Notes (Signed)
PROGRESS NOTE                                                                                                                                                                                                             Patient Demographics:    Joshua Donovan, is a 66 y.o. male, DOB - 09-Oct-1955, GXQ:[id number]  Outpatient Primary MD for the patient is [facility], [place]    LOS - 16  Admit date - 08/03/2021       Brief Narrative (HPI from H&P)   66 year old with a history of ICH, DM 2, HTN, and HLD who presented to [facility] 7/24 with right hand numbness and weakness and was diagnosed with a small brainstem stroke.  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  He required intubation and was transferred to [facility] for an interventional radiology intervention.  He ultimately required tracheostomy and PEG tube placement, and has subsequently suffered a prolonged hospital stay.   Significant events:  7/24 presented to [facility], ventral medulla CVA 7/25 tx to [facility], treated with Cleviprex 7/26 cerebral angiogram with stent placement to right vertebro-basilar junction, failed extubation, MRI brain> mild extension of stroke, now involving bilateral medial medullary, patent basilar artery and R VBJ stent  7/28 bedside tracheostomy by PCCM and PEG tube placement by General Surgery 7/31 Bleeding around trach site, bright red blood.  Underwent bronchoscopy, Trach Removed, patient orally reintubated, stoma packed. 8/2 ENT took patient to OR for trach redo 8/9 transferred to medical floor on tracheostomy 8/12 vomiting after bolus feeding with worsening hypoxia 8/13 copious bleeding from the heparin injection site controlled with pressure dressing 8/14 bleeding has resolved.  Trach secretions blood-tinged. 8/15 insurance denied LTAC appeal.  CSW working on SNF. 8/28: peer to peer for LTAC looks like  this was denied 8/29: V. 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  Continue supportive care.  Continue PT OT as tolerated, await SNF bed.  -PER MD [name] on 10/20/2021 duration of dual antiplatelet therapy is 6 months starting from 08/04/2021 thereafter aspirin alone.  Acute respiratory failure with hypoxia (HCC) along with aspiration pneumonia. Patient required ICU admission and mechanical ventilation after admission secondary to worsening mentation. Concern for possible aspiration pneumonitis. Patient unable to extubate. Tracheostomy placed on 7/31 and revised by ENT on 08/11/21 secondary to bleeding. Currently stable.  PCCM for trach management and he has finished antibiotic treatment for his infections.  Continue supportive care and trach hygiene per RT, his trach secretions continue to be persistently high, repeat trach aspiration and cultures done by PCCM on 10/25/2021, trial of low-dose Lasix and scopolamine patch started on 10/26/2021.  PCCM monitoring. -culture showing few serratia   Alcohol abuse - Initially managed on CIWA.  No signs of DTs now, Continue thiamine, multivitamin and folic acid.  Mixed hyperlipidemia - Continue Lipitor.  Hypertension - Continue clonidine, amlodipine, hydrochlorothiazide and Coreg  NSVT (nonsustained ventricular tachycardia) (HCC) EF 60% - Patient with intermittent Vtach. Most recently with 25 beats of vtach on 08/30/21. QTc of 424 msec,  -Replete potassium and magnesium, Cardiology consult, maintain mag >2 and k >4, no additioonal cardiac w/u.  Continue beta-blocker.  Dysphagia - Patient is s/p PEG tube and is on tube feeds via PEG. Continue tube feeds and free water.  Tracheostomy complication (HCC)-resolved as of 08/25/2021 - In setting of DAPT. ENT consulted for revision. Bleeding resolved.  Hypokalemia and hypomagnesemia.   -stopped HCTZ which likely is worsening his hypokalemia, still on scheduled potassium supplementation monitor potassium levels closely.  Uncontrolled type 2 diabetes mellitus with hyperglycemia (HCC) - Most recent hemoglobin A1C of 8.3%. uncontrolled with hyper- and hypoglycemia.  Levemir along with NovoLog every 4 hours, dose adjusted on 10/20/2021.    Lab Results  Component Value Date   HGBA1C 8.3 (H) 08/03/2021   CBG (last 3)  Recent Labs    10/26/21 1251 10/26/21 1730 10/26/21 2324  GLUCAP 167* 150* 263*           Condition - Extremely Guarded  Family Communication  : None present  Code Status : Full code  Consults  : ENT, PCCM, neurology  PUD Prophylaxis : Pepcid     Inpatient Medications  Scheduled Meds:  amLODipine  10 mg Per Tube Daily   aspirin  81 mg Per Tube Daily   atorvastatin  80 mg Per Tube Daily   carvedilol  37.5 mg Per Tube BID WC   diclofenac Sodium  2 g Topical QID   famotidine  20 mg Per Tube Daily   feeding supplement ([name])  237 mL Per Tube 6 X Daily   feeding supplement (PROSource TF20)  60 mL Per Tube Daily   folic acid  1 mg Per Tube Daily   free water  100 mL Per Tube Q4H   guaiFENesin  10 mL Per Tube Q4H   insulin aspart  0-9 Units Subcutaneous Q6H   insulin detemir  30 Units Subcutaneous BID   multivitamin with minerals  1 tablet Per Tube Daily   mouth rinse  15 mL Mouth Rinse Q2H   potassium chloride  40 mEq Per Tube BID   scopolamine  1 patch Transdermal Q72H   thiamine  100 mg Per Tube Daily   ticagrelor  90 mg Per  Tube BID   Continuous  Infusions:   PRN Meds:.[DISCONTINUED] acetaminophen **OR** acetaminophen (TYLENOL) oral liquid 160 mg/5 mL **OR** [DISCONTINUED] acetaminophen, bisacodyl, hydrALAZINE, labetalol, liver oil-zinc oxide, mouth rinse, polyethylene glycol, sodium chloride     Objective:   Vitals:   10/26/21 1958 10/26/21 2321 10/27/21 0151 10/27/21 0300  BP: 111/78 98/77 116/88 107/81  Pulse: 86 88 83 81  Resp: (!) '23 20 19 '$ (!) 28  Temp: 98.1 F (36.7 C) 98.1 F (36.7 C)  97.8 F (36.6 C)  TempSrc: Oral Oral  Oral  SpO2: 94% 93% 95% 94%  Weight:      Height:        Wt Readings from Last 3 Encounters:  10/04/21 79.8 kg  08/02/21 83.9 kg  02/20/20 88.6 kg     Intake/Output Summary (Last 24 hours) at 10/27/2021 1037 Last data filed at 10/26/2021 1132 Gross per 24 hour  Intake --  Output 900 ml  Net -900 ml     Physical Exam   General: Appearance:     Overweight male in no acute distress     Lungs:      respirations unlabored  Heart:    Normal heart rate.   MS:   All extremities are intact.   Neurologic:   sleeping         Data Review:    CBC Recent Labs  Lab 10/24/21 0649 10/27/21 0728  WBC 7.5 8.3  HGB 13.1 14.0  HCT 38.6* 42.8  PLT 348 373  MCV 82.0 82.9  MCH 27.8 27.1  MCHC 33.9 32.7  RDW 14.6 14.9  LYMPHSABS 1.3 1.5  MONOABS 0.7 0.7  EOSABS 0.5 0.4  BASOSABS 0.1 0.1    Electrolytes Recent Labs  Lab 10/21/21 0305 10/22/21 0555 10/23/21 0328 10/24/21 0649 10/25/21 0656 10/26/21 0346 10/26/21 0817 10/27/21 0728  NA 140   < > 138 138 141 140  --  140  K 3.1*   < > 3.4* 3.4* 3.5 3.6  --  3.9  CL 100   < > 103 104 106 105  --  104  CO2 27   < > '26 25 26 25  '$ --  26  GLUCOSE 65*   < > 72 155* 224* 123*  --  188*  BUN 14   < > '11 11 15 15  '$ --  22  CREATININE 0.55*   < > 0.50* 0.56* 0.55* 0.57*  --  0.60*  CALCIUM 9.4   < > 9.1 9.1 9.0 9.4  --  9.7  AST  --   --   --   --   --   --   --  28  ALT  --   --   --   --   --   --   --   47*  ALKPHOS  --   --   --   --   --   --   --  41  BILITOT  --   --   --   --   --   --   --  0.7  ALBUMIN  --   --   --   --   --   --   --  3.0*  MG 2.0  --   --   --   --  1.8  --  1.8  BNP  --   --   --  28.7  --   --  15.9  --    < > = values in this  interval not displayed.    Signature  Geradine Girt DO on 10/27/2021 at 10:37 AM   -  To page go to [email or web address]

## 2021-10-28 LAB — GLUCOSE, CAPILLARY
Glucose-Capillary: 139 mg/dL — ABNORMAL HIGH (ref 70–99)
Glucose-Capillary: 219 mg/dL — ABNORMAL HIGH (ref 70–99)
Glucose-Capillary: 237 mg/dL — ABNORMAL HIGH (ref 70–99)
Glucose-Capillary: 287 mg/dL — ABNORMAL HIGH (ref 70–99)
Glucose-Capillary: 90 mg/dL (ref 70–99)

## 2021-10-28 NOTE — TOC Progression Note (Signed)
Transition of Care [facility]) - Progression Note    Patient Details  Name: Elam Ellis MRN: [id number] Date of Birth: [birth year]  Transition [facility] CM/SW [place], [place] Phone Number: 10/28/2021, 2:58 PM  Clinical Narrative:     Resent referral to [facility] as requested by patient's daughter, Joelene Millin.   Called [facility] informed- resending referral for review   Thurmond Butts, MSW, LCSW Clinical Social Worker    Expected Discharge Plan: Skilled Nursing Facility Barriers to Discharge: SNF Pending bed offer  Expected Discharge Plan and Services Expected Discharge Plan: [place]   Discharge Planning Services: CM Consult Post Acute Care Choice: Long Term Acute Care (LTAC) Living arrangements for the past 2 months: Single Family Home                                       Social Determinants of Health (SDOH) Interventions    Readmission Risk Interventions     No data to display

## 2021-10-28 NOTE — Progress Notes (Signed)
PROGRESS NOTE                                                                                                                                                                                                             Patient Demographics:    Joshua Donovan, is a 66 y.o. male, DOB - 1955/05/13, CXK:[id number]  Outpatient Primary MD for the patient is [facility], [place]    LOS - 97  Admit date - 08/03/2021       Brief Narrative (HPI from H&P)   66 year old with a history of ICH, DM 2, HTN, and HLD who presented to [facility] 7/24 with right hand numbness and weakness and was diagnosed with a small brainstem stroke.  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  He required intubation and was transferred to [facility] for an interventional radiology intervention.  He ultimately required tracheostomy and PEG tube placement, and has subsequently suffered a prolonged hospital stay.   Significant events:  7/24 presented to [facility] ventral medulla CVA 7/25 tx to [facility], treated with Cleviprex 7/26 cerebral angiogram with stent placement to right vertebro-basilar junction, failed extubation, MRI brain> mild extension of stroke, now involving bilateral medial medullary, patent basilar artery and R VBJ stent  7/28 bedside tracheostomy by PCCM and PEG tube placement by General Surgery 7/31 Bleeding around trach site, bright red blood.  Underwent bronchoscopy, Trach Removed, patient orally reintubated, stoma packed. 8/2 ENT took patient to OR for trach redo 8/9 transferred to medical floor on tracheostomy 8/12 vomiting after bolus feeding with worsening hypoxia 8/13 copious bleeding from the heparin injection site controlled with pressure dressing 8/14 bleeding has resolved.  Trach secretions blood-tinged. 8/15 insurance denied LTAC appeal.  CSW working on SNF. 8/28: peer to peer for LTAC looks like  this was denied 8/29: V. 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  Continue supportive care.  Continue PT OT as tolerated, await SNF bed.  -PER MD [name] on 10/20/2021 duration of dual antiplatelet therapy is 6 months starting from 08/04/2021 thereafter aspirin alone.  Acute respiratory failure with hypoxia (HCC) along with aspiration pneumonia. Patient required ICU admission and mechanical ventilation after admission secondary to worsening mentation. Concern for possible aspiration pneumonitis. Patient unable to extubate. Tracheostomy placed on 7/31 and revised by ENT on 08/11/21 secondary to bleeding. Currently stable.  PCCM for trach management and he has finished antibiotic treatment for his infections.  Continue supportive care and trach hygiene per RT, his trach secretions continue to be persistently high, repeat trach aspiration and cultures done by PCCM on 10/25/2021,  scopolamine patch started on 10/26/2021. PRN lasix,  PCCM monitoring. -culture showing few serratia - await final  Alcohol abuse - Initially managed on CIWA.  No signs of DTs now, Continue thiamine, multivitamin and folic acid.  Mixed hyperlipidemia - Continue Lipitor.  Hypertension - Continue clonidine, amlodipine, hydrochlorothiazide and Coreg  NSVT (nonsustained ventricular tachycardia) (HCC) EF 60% - Patient with intermittent Vtach. Most recently with 25 beats of vtach on 08/30/21. QTc of 424 msec, -Replete  potassium and magnesium, Cardiology consult, maintain mag >2 and k >4, no additioonal cardiac w/u.  Continue beta-blocker.  Dysphagia - Patient is s/p PEG tube and is on tube feeds via PEG. Continue tube feeds and free water.  Tracheostomy complication (HCC)-resolved as of 08/25/2021 - In setting of DAPT. ENT consulted for revision. Bleeding resolved.  Hypokalemia and hypomagnesemia.   -stopped HCTZ which likely is worsening his hypokalemia, still on scheduled potassium supplementation monitor potassium levels closely.  Uncontrolled type 2 diabetes mellitus with hyperglycemia (HCC) - Most recent hemoglobin A1C of 8.3%. uncontrolled with hyper- and hypoglycemia.  Levemir along with NovoLog every 4 hours, dose adjusted on 10/20/2021.    Lab Results  Component Value Date   HGBA1C 8.3 (H) 08/03/2021   CBG (last 3)  Recent Labs    10/27/21 1704 10/27/21 2316 10/28/21 0546  GLUCAP 154* 222* 90           Condition - Extremely Guarded  Family Communication  : None present  Code Status : Full code  Consults  : ENT, PCCM, neurology  PUD Prophylaxis : Pepcid     Inpatient Medications  Scheduled Meds:  amLODipine  10 mg Per Tube Daily   aspirin  81 mg Per Tube Daily   atorvastatin  80 mg Per Tube Daily   carvedilol  37.5 mg Per Tube BID WC   diclofenac Sodium  2 g Topical QID   famotidine  20 mg Per Tube Daily   feeding supplement ([name])  237 mL Per Tube 6 X Daily   feeding supplement (PROSource TF20)  60 mL Per Tube Daily   folic acid  1 mg Per Tube Daily   free water  100 mL Per Tube Q4H   guaiFENesin  10 mL Per Tube Q4H   insulin aspart  0-9 Units Subcutaneous Q6H   insulin detemir  30 Units Subcutaneous BID   multivitamin with minerals  1 tablet Per Tube Daily   mouth rinse  15 mL Mouth Rinse Q2H   potassium chloride  40 mEq Per Tube BID   scopolamine  1 patch Transdermal Q72H   thiamine  100 mg Per Tube Daily   ticagrelor  90 mg Per Tube BID    Continuous  Infusions:   PRN Meds:.[DISCONTINUED] acetaminophen **OR** acetaminophen (TYLENOL) oral liquid 160 mg/5 mL **OR** [DISCONTINUED] acetaminophen, bisacodyl, hydrALAZINE, labetalol, liver oil-zinc oxide, mouth rinse, polyethylene glycol, sodium chloride     Objective:   Vitals:   10/27/21 2317 10/27/21 2338 10/28/21 0325 10/28/21 0747  BP: 129/81  139/87 135/80  Pulse:  92 79 87  Resp:  (!) 26 (!) 25 20  Temp: 98.7 F (37.1 C)  98.1 F (36.7 C) 99 F (37.2 C)  TempSrc: Axillary  Oral Oral  SpO2:  99% 94% 95%  Weight:      Height:        Wt Readings from Last 3 Encounters:  10/04/21 79.8 kg  08/02/21 83.9 kg  02/20/20 88.6 kg     Intake/Output Summary (Last 24 hours) at 10/28/2021 1116 Last data filed at 10/28/2021 0325 Gross per 24 hour  Intake --  Output 1200 ml  Net -1200 ml     Physical Exam    General: Appearance:     Overweight male in no acute distress     Lungs:     Stronger cough, respirations unlabored  Heart:    Normal heart rate. .   MS:   All extremities are intact.   Neurologic:   Awake, alert           Data Review:    CBC Recent Labs  Lab 10/24/21 0649 10/27/21 0728  WBC 7.5 8.3  HGB 13.1 14.0  HCT 38.6* 42.8  PLT 348 373  MCV 82.0 82.9  MCH 27.8 27.1  MCHC 33.9 32.7  RDW 14.6 14.9  LYMPHSABS 1.3 1.5  MONOABS 0.7 0.7  EOSABS 0.5 0.4  BASOSABS 0.1 0.1    Electrolytes Recent Labs  Lab 10/23/21 0328 10/24/21 0649 10/25/21 0656 10/26/21 0346 10/26/21 0817 10/27/21 0728  NA 138 138 141 140  --  140  K 3.4* 3.4* 3.5 3.6  --  3.9  CL 103 104 106 105  --  104  CO2 '26 25 26 25  '$ --  26  GLUCOSE 72 155* 224* 123*  --  188*  BUN '11 11 15 15  '$ --  22  CREATININE 0.50* 0.56* 0.55* 0.57*  --  0.60*  CALCIUM 9.1 9.1 9.0 9.4  --  9.7  AST  --   --   --   --   --  28  ALT  --   --   --   --   --  47*  ALKPHOS  --   --   --   --   --  41  BILITOT  --   --   --   --   --  0.7  ALBUMIN  --   --   --   --   --  3.0*   MG  --   --   --  1.8  --  1.8  BNP  --  28.7  --   --  15.9  --     Signature  Geradine Girt DO on 10/28/2021 at 11:16 AM   -  To page go to [email or web address]

## 2021-10-28 NOTE — TOC Progression Note (Signed)
Transition of Care [facility]) - Progression Note    Patient Details  Name: Joshua Donovan MRN: [id number] Date of Birth: Jun 09, 1955  Transition of Care [facility]) CM/SW [place], [place] Phone Number: 10/28/2021, 11:18 AM  Clinical Narrative:     [place] declined [facility]- left voice message   Thurmond Butts, MSW, LCSW Clinical Social Worker    Expected Discharge Plan: Skilled Nursing Facility Barriers to Discharge: SNF Pending bed offer  Expected Discharge Plan and Services Expected Discharge Plan: [place]   Discharge Planning Services: CM Consult Post Acute Care Choice: Long Term Acute Care (LTAC) Living arrangements for the past 2 months: Single Family Home                                       Social Determinants of Health (SDOH) Interventions    Readmission Risk Interventions     No data to display

## 2021-10-29 LAB — GLUCOSE, CAPILLARY
Glucose-Capillary: 136 mg/dL — ABNORMAL HIGH (ref 70–99)
Glucose-Capillary: 229 mg/dL — ABNORMAL HIGH (ref 70–99)
Glucose-Capillary: 229 mg/dL — ABNORMAL HIGH (ref 70–99)
Glucose-Capillary: 87 mg/dL (ref 70–99)

## 2021-10-29 NOTE — Progress Notes (Signed)
Occupational Therapy Treatment Patient Details Name: Joshua Donovan MRN: [id number] DOB: 1955/03/04 Today's Date: 10/29/2021   History of present illness Pt is a 66 y.o. male who presented 08/02/21 with R foot pain and R-sided weakness. Transferred to [facility] 7/25. 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 S/p stent placement to the R vertebrobasilar junction 7/26, failed extubation. Trach and PEG placed 7/28. S/p bronchoscopic evaluation, oral reintubation and tracheostomy removal with stoma packing 7/31 due to trach site bleeding. S/p trach revision 8/2. PMH: DM, GERD, TBI with prior ICH, HLD, HTN   OT comments  Pt still at total assist for rolling in bed and sitting balance.  He needs total +2 for sidelying to sit and for squat pivot transfers.  Increased extensor tone emerging in UEs, trunk, and LEs with cervical AAROM/AROM.  Vitals stable throughout with O2 sats at 96% on 5Ls trach collar medical air.  Feel progress remains slow, but warranted for increased AROM and balance in order to integrate AE for communication, selfcare, and mobility.     Recommendations for follow up therapy are one component of a multi-disciplinary discharge planning process, led by the attending physician.  Recommendations may be updated based on patient status, additional functional criteria and insurance authorization.    Follow Up Recommendations  Skilled nursing-short term rehab (<3 hours/day)    Assistance Recommended at Discharge Frequent or constant Supervision/Assistance  Patient can return home with the following  Two people to help with walking  and/or transfers;Two people to help with bathing/dressing/bathroom;Assistance with cooking/housework;Assistance with feeding;Direct supervision/assist for medications management;Direct supervision/assist for financial management;Assist for transportation;Help with stairs or ramp for entrance   Equipment Recommendations  Other (comment) (TBD next venue of care)       Precautions / Restrictions Precautions Precautions: Fall Precaution Comments: trach collar, PEG, SBP < 180,  PMV on during waking hours if O2 sats are good Restrictions Weight Bearing Restrictions: No       Mobility Bed Mobility Overal bed mobility: Needs Assistance Bed Mobility: Rolling Rolling: Total assist Sidelying to sit: Total assist, +2 for physical assistance            Transfers Overall transfer level: Needs assistance Equipment used: None Transfers: Bed to chair/wheelchair/BSC     Squat pivot transfers: Total assist, +2 physical assistance       General transfer comment: Squat pivot to the wheelchair with use of the bed pad to assist with lifting.     Balance Overall balance assessment: Needs assistance Sitting-balance support: Feet supported Sitting balance-Leahy Scale: Zero Sitting balance - Comments: Total assist for static sitting balance.                                   ADL either performed or assessed with clinical judgement   ADL Overall ADL's : Needs assistance/impaired                     Lower Body Dressing: Total assistance;Bed level Lower Body Dressing Details (indicate cue type and reason): gripper socks Toilet Transfer: Total assistance;+2 for physical assistance Toilet Transfer Details (indicate cue type  and reason): simulated         Functional mobility during ADLs: Total assistance;+2 for safety/equipment;+2 for physical assistance (scoot pivot to the wheelchair) General ADL Comments: Pt sat EOB for 18 mins during session with O2 sats at 96-98%  on 28% trach collar medical air.   Total assist for static sitting balance while working on holding head up and at midline.  Min to Mod assist for initiation of cervical extension and then he could maintain for 15-30 second intervals while engaged in working on vocalizations. Worked on small head turns right and left while holding head up to neutral cervical extension.  He was able to demonstrate approximately 10-15 degrees of active motion.  Increased extensor tone noted in trunk and LEs with cervical movements.  Total + 2 for squat pivot transfer to the tilt in space wheelchair.               Cognition Arousal/Alertness: Awake/alert Behavior During Therapy: WFL for tasks assessed/performed Overall Cognitive Status: Difficult to assess                     Current Attention Level: Sustained   Following Commands: Follows one step commands consistently (Consistently follows simple one step commands related to initiation of head movement secondadry to motor impairments)                           Pertinent Vitals/ Pain       Pain Assessment Pain Assessment: Faces Pain Score: 0-No pain         Frequency  Min 2X/week        Progress Toward Goals  OT Goals(current goals can now be found in the care plan section)  Progress towards OT goals: Progressing toward goals  Acute Rehab OT Goals Patient Stated Goal: Pt did not state OT Goal Formulation: With patient Time For Goal Achievement: 11/02/21 Potential to Achieve Goals: [place] Discharge plan remains appropriate    Co-evaluation    PT/OT/SLP Co-Evaluation/Treatment: Yes Reason for Co-Treatment: Complexity of the patient's impairments (multi-system involvement);For patient/therapist safety   OT goals addressed during session: Strengthening/ROM;Other (comment) (head control)      AM-PAC OT "6 Clicks" Daily Activity     Outcome Measure   Help from another person eating meals?: Total Help from  another person taking care of personal grooming?: Total Help from another person toileting, which includes using toliet, bedpan, or urinal?: Total Help from another person bathing (including washing, rinsing, drying)?: Total Help from another person to put on and taking off regular upper body clothing?: Total Help from another person to put on and taking off regular lower body clothing?: Total 6 Click Score: 6    End of Session Equipment Utilized During Treatment: Oxygen  OT Visit Diagnosis: Other symptoms and signs involving the nervous system (R29.898);Muscle weakness (generalized) (M62.81);Feeding difficulties (R63.3);Other abnormalities of gait and mobility (R26.89);Unsteadiness on feet (R26.81);Other (comment)   Activity Tolerance Patient tolerated treatment well   Patient Left with call bell/phone within reach;in chair;with nursing/sitter in room;with family/visitor present   Nurse Communication Need for lift equipment        Time: 1601-0932 OT Time Calculation (min): 39 min  Charges: OT General Charges $OT Visit: 1 Visit OT Treatments $Self Care/Home Management : 23-37 mins  Kyndle Schlender OTR/L 10/29/2021, 1:07 PM

## 2021-10-29 NOTE — Progress Notes (Signed)
PROGRESS NOTE                                                                                                                                                                                                             Patient Demographics:    Joshua Donovan, is a 66 y.o. 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  He required intubation and was transferred to [facility] for an interventional radiology intervention.  He ultimately required tracheostomy and PEG tube placement, and has subsequently suffered a prolonged hospital stay.   Significant events:  7/24 presented to [facility], ventral medulla CVA 7/25 tx to [facility], treated with Cleviprex 7/26 cerebral angiogram with stent placement to right vertebro-basilar junction, failed extubation, MRI brain> mild extension of stroke, now involving bilateral medial medullary, patent basilar artery and R VBJ stent  7/28 bedside tracheostomy by PCCM and PEG tube placement by General Surgery 7/31 Bleeding around trach site, bright red blood.  Underwent bronchoscopy, Trach Removed, patient orally reintubated, stoma packed. 8/2 ENT took patient to OR for trach redo 8/9 transferred to medical floor on tracheostomy 8/12 vomiting after bolus feeding with worsening hypoxia 8/13 copious bleeding from the heparin injection site controlled with pressure dressing 8/14 bleeding has resolved.  Trach secretions blood-tinged. 8/15 insurance denied LTAC appeal.  CSW working on SNF. 8/28: peer to peer for LTAC looks like  this was denied 8/29: V. 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  Continue supportive care.  Continue PT OT as tolerated, await SNF bed.  -PER MD [name] on 10/20/2021 duration of dual antiplatelet therapy is 6 months starting from 08/04/2021 thereafter aspirin alone.  Acute respiratory failure with hypoxia (HCC) along with aspiration pneumonia. Patient required ICU admission and mechanical ventilation after admission secondary to worsening mentation. Concern for possible aspiration pneumonitis. Patient unable to extubate. Tracheostomy placed on 7/31 and revised by ENT on 08/11/21 secondary to bleeding. Currently stable.  PCCM for trach management and he has finished antibiotic treatment for his infections.  Continue supportive care and trach hygiene per RT, his trach secretions continue to be persistently high, repeat trach aspiration and cultures done by PCCM on 10/25/2021,  scopolamine patch started on 10/26/2021. PRN lasix,  PCCM monitoring. -culture showing few serratia - await final  Alcohol abuse - Initially managed on CIWA.  No signs of DTs now, Continue thiamine, multivitamin and folic acid.  Mixed hyperlipidemia - Continue Lipitor.  Hypertension - Continue clonidine, amlodipine, hydrochlorothiazide and Coreg  NSVT (nonsustained ventricular tachycardia) (HCC) EF 60% - Patient with intermittent Vtach. Most recently with 25 beats of vtach on 08/30/21. QTc of 424 msec, -Replete  potassium and magnesium, Cardiology consult, maintain mag >2 and k >4, no additioonal cardiac w/u.  Continue beta-blocker.  Dysphagia - Patient is s/p PEG tube and is on tube feeds via PEG. Continue tube feeds and free water.  Tracheostomy complication (HCC)-resolved as of 08/25/2021 - In setting of DAPT. ENT consulted for revision. Bleeding resolved.  Hypokalemia and hypomagnesemia.   -stopped HCTZ which likely is worsening his hypokalemia, still on scheduled potassium supplementation monitor potassium levels closely.  Uncontrolled type 2 diabetes mellitus with hyperglycemia (HCC) - Most recent hemoglobin A1C of 8.3%. uncontrolled with hyper- and hypoglycemia.  Levemir along with NovoLog every 4 hours, dose adjusted on 10/20/2021.    Lab Results  Component Value Date   HGBA1C 8.3 (H) 08/03/2021   CBG (last 3)  Recent Labs    10/28/21 1953 10/28/21 2338 10/29/21 0542  GLUCAP 287* 237* 87           Condition - Extremely Guarded  Family Communication  : None present  Code Status : Full code  Consults  : ENT, PCCM, neurology  PUD Prophylaxis : Pepcid     Inpatient Medications  Scheduled Meds:  amLODipine  10 mg Per Tube Daily   aspirin  81 mg Per Tube Daily   atorvastatin  80 mg Per Tube Daily   carvedilol  37.5 mg Per Tube BID WC   diclofenac Sodium  2 g Topical QID   famotidine  20 mg Per Tube Daily   feeding supplement ([name])  237 mL Per Tube 6 X Daily   feeding supplement (PROSource TF20)  60 mL Per Tube Daily   folic acid  1 mg Per Tube Daily   free water  100 mL Per Tube Q4H   guaiFENesin  10 mL Per Tube Q4H   insulin aspart  0-9 Units Subcutaneous Q6H   insulin detemir  30 Units Subcutaneous BID   multivitamin with minerals  1 tablet Per Tube Daily   mouth rinse  15 mL Mouth Rinse Q2H   potassium chloride  40 mEq Per Tube BID   scopolamine  1 patch Transdermal Q72H   thiamine  100 mg Per Tube Daily   ticagrelor  90 mg Per Tube BID    Continuous  Infusions:   PRN Meds:.[DISCONTINUED] acetaminophen **OR** acetaminophen (TYLENOL) oral liquid 160 mg/5 mL **OR** [DISCONTINUED] acetaminophen, bisacodyl, hydrALAZINE, labetalol, liver oil-zinc oxide, mouth rinse, polyethylene glycol, sodium chloride     Objective:   Vitals:   10/29/21 0320 10/29/21 0329 10/29/21 0735 10/29/21 0755  BP: (!) 147/91  120/79 120/79  Pulse: 85 85 81 89  Resp: (!) 28 (!) 29 (!) 30 (!) 24  Temp: 98.2 F (36.8 C)  98.5 F (36.9 C)   TempSrc: Oral  Oral   SpO2: 92% 95% 94% 95%  Weight:      Height:        Wt Readings from Last 3 Encounters:  10/04/21 79.8 kg  08/02/21 83.9 kg  02/20/20 88.6 kg     Intake/Output Summary (Last 24 hours) at 10/29/2021 1011 Last data filed at 10/29/2021 0448 Gross per 24 hour  Intake --  Output 300 ml  Net -300 ml     Physical Exam     General: Appearance:     Overweight male in no acute distress     Lungs:     respirations unlabored  Heart:    Normal heart rate.   MS:   All extremities are intact.   Neurologic:   Awake, alert             Data Review:    CBC Recent Labs  Lab 10/24/21 0649 10/27/21 0728  WBC 7.5 8.3  HGB 13.1 14.0  HCT 38.6* 42.8  PLT 348 373  MCV 82.0 82.9  MCH 27.8 27.1  MCHC 33.9 32.7  RDW 14.6 14.9  LYMPHSABS 1.3 1.5  MONOABS 0.7 0.7  EOSABS 0.5 0.4  BASOSABS 0.1 0.1    Electrolytes Recent Labs  Lab 10/23/21 0328 10/24/21 0649 10/25/21 0656 10/26/21 0346 10/26/21 0817 10/27/21 0728  NA 138 138 141 140  --  140  K 3.4* 3.4* 3.5 3.6  --  3.9  CL 103 104 106 105  --  104  CO2 '26 25 26 25  '$ --  26  GLUCOSE 72 155* 224* 123*  --  188*  BUN '11 11 15 15  '$ --  22  CREATININE 0.50* 0.56* 0.55* 0.57*  --  0.60*  CALCIUM 9.1 9.1 9.0 9.4  --  9.7  AST  --   --   --   --   --  28  ALT  --   --   --   --   --  47*  ALKPHOS  --   --   --   --   --  41  BILITOT  --   --   --   --   --  0.7  ALBUMIN  --   --   --   --   --  3.0*  MG  --   --   --   1.8  --  1.8  BNP  --  28.7  --   --  15.9  --     Signature  Geradine Girt DO on 10/29/2021 at 10:11 AM   -  To page go to [email or web address]

## 2021-10-29 NOTE — Progress Notes (Signed)
Physical Therapy Treatment Patient Details Name: Joshua Donovan MRN: [id number] DOB: 1955/10/02 Today's Date: 10/29/2021   History of Present Illness Pt is a 66 y.o. male who presented 08/02/21 with R foot pain and R-sided weakness. Transferred to [facility] 7/25. 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 S/p stent placement to the R vertebrobasilar junction 7/26, failed extubation. Trach and PEG placed 7/28. S/p bronchoscopic evaluation, oral reintubation and tracheostomy removal with stoma packing 7/31 due to trach site bleeding. S/p trach revision 8/2. PMH: DM, GERD, TBI with prior ICH, HLD, HTN    PT Comments    The pt was agreeable to session, demos continued slow progress with mobility at this time. The session was focused on seated balance activities, and the pt tolerated sitting EOB for ~18 min with VSS and needing various assist levels ranging from [place] to [place]. The pt will continue to benefit from skilled PT to progress cervical ROM and strength to allow for greater independence with head position and accessibility to adaptive tools.     Recommendations for follow up therapy are one component of a multi-disciplinary discharge planning process, led by the attending physician.  Recommendations may be updated based on patient status, additional functional criteria and insurance authorization.  Follow Up Recommendations  Skilled nursing-short term rehab (<3 hours/day) Can patient physically be transported by private vehicle: No   Assistance Recommended at Discharge Frequent or constant Supervision/Assistance  Patient can return  home with the following Assistance with cooking/housework;Two people to help with walking and/or transfers;Two people to help with bathing/dressing/bathroom;Assistance with feeding;Direct supervision/assist for medications management;Direct supervision/assist for financial management;Assist for transportation;Help with stairs or ramp for entrance   Equipment Recommendations  Other (comment) (defer to post acute)    Recommendations for Other Services       Precautions / Restrictions Precautions Precautions: Fall Precaution Comments: trach collar, PEG, SBP < 180,  PMV on during waking hours if O2 sats are good Restrictions Weight Bearing Restrictions: No     Mobility  Bed Mobility Overal bed mobility: Needs Assistance Bed Mobility: Rolling Rolling: Total assist Sidelying to sit: Total assist, +2 for physical assistance       General bed mobility comments: pt unable to provide assistance    Transfers Overall transfer level: Needs assistance Equipment used: None Transfers: Bed to chair/wheelchair/BSC       Squat pivot transfers: Total assist, +2 physical assistance     General transfer comment: Squat pivot to the wheelchair with use of the bed pad to assist with lifting.    Ambulation/Gait               General Gait Details: unable     Balance Overall balance assessment: Needs assistance Sitting-balance support: Feet supported Sitting balance-Leahy Scale: Zero Sitting balance - Comments: Total assist for static sitting balance. max cues for head positioning but pt with frequent extensor tone when extending at neck                                    Cognition Arousal/Alertness: Awake/alert Behavior During Therapy: [facility] for tasks assessed/performed Overall Cognitive  Status: Difficult to assess                                 General Comments: PMSV in place, pt vocalizing yes and no in a soft voice to simple questioning.         Exercises Other Exercises Other Exercises: cervical rotation x2 each direction, minA to maintain head in neutral position (not flexed)    General Comments General comments (skin integrity, edema, etc.): VSS with all movement      Pertinent Vitals/Pain Pain Assessment Pain Assessment: Faces Pain Score: 0-No pain Pain Intervention(s): Monitored during session     PT Goals (current goals can now be found in the care plan section) Acute Rehab PT Goals Patient Stated Goal: agreeable to session PT Goal Formulation: With patient/family Time For Goal Achievement: 11/12/21 Potential to Achieve Goals: Fair Progress towards PT goals: Progressing toward goals    Frequency    Min 2X/week      PT Plan Current plan remains appropriate    Co-evaluation PT/OT/SLP Co-Evaluation/Treatment: Yes Reason for Co-Treatment: Complexity of the patient's impairments (multi-system involvement);Necessary to address cognition/behavior during functional activity;For patient/therapist safety;To address functional/ADL transfers PT goals addressed during session: Mobility/safety with mobility;Balance;Strengthening/ROM OT goals addressed during session: Strengthening/ROM;Other (comment) (head control)      AM-PAC PT "6 Clicks" Mobility   Outcome Measure  Help needed turning from your back to your side while in a flat bed without using bedrails?: Total Help needed moving from lying on your back to sitting on the side of a flat bed without using bedrails?: Total Help needed moving to and from a bed to a chair (including a wheelchair)?: Total Help needed standing up from a chair using your arms (e.g., wheelchair or bedside chair)?: Total Help needed to walk in hospital room?: Total Help needed climbing 3-5 steps with a railing? : Total 6 Click Score: 6    End of Session Equipment Utilized During Treatment: Other (comment) (medical air) Activity Tolerance: Patient tolerated treatment well Patient  left: in chair;with call bell/phone within reach Nurse Communication: Need for lift equipment;Mobility status PT Visit Diagnosis: Muscle weakness (generalized) (M62.81);Other symptoms and signs involving the nervous system (R29.898);Other abnormalities of gait and mobility (R26.89);Other (comment) Hemiplegia - Right/Left: Right Hemiplegia - dominant/non-dominant: Dominant Hemiplegia - caused by: Cerebral infarction     Time: 5170-0174 PT Time Calculation (min) (ACUTE ONLY): 39 min  Charges:  $Therapeutic Activity: 8-22 mins                     [name], PT, DPT   Acute Rehabilitation Department   [name] 10/29/2021, 3:09 PM

## 2021-10-29 NOTE — Progress Notes (Signed)
Mobility Specialist Progress Note   10/29/21 1430  Mobility  Activity Transferred from chair to bed  Level of Assistance +2 (takes two people)  Assistive Device MaxiMove  Activity Response Tolerated well  $Mobility charge 1 Mobility   NT requesting assistance to get pt back in bed. No faults on transfer but needing pericare once in bed. Assisted in pericare and left w/ all needs met.  Holland Falling Mobility Specialist Acute Rehab Office:  [phone]

## 2021-10-30 LAB — CULTURE, RESPIRATORY W GRAM STAIN

## 2021-10-30 LAB — GLUCOSE, CAPILLARY
Glucose-Capillary: 136 mg/dL — ABNORMAL HIGH (ref 70–99)
Glucose-Capillary: 202 mg/dL — ABNORMAL HIGH (ref 70–99)
Glucose-Capillary: 222 mg/dL — ABNORMAL HIGH (ref 70–99)
Glucose-Capillary: 226 mg/dL — ABNORMAL HIGH (ref 70–99)
Glucose-Capillary: 249 mg/dL — ABNORMAL HIGH (ref 70–99)

## 2021-10-30 NOTE — Progress Notes (Signed)
PROGRESS NOTE                                                                                                                                                                                                             Patient Demographics:    Joshua Donovan, is a 65 y.o. male, DOB - 1955-06-19, ZOX:[id number]  Outpatient Primary MD for the patient is [facility], [employer]    LOS - 17  Admit date - 08/03/2021       Brief Narrative (HPI from H&P)   66 year old with a history of ICH, DM 2, HTN, and HLD who presented to [facility] 7/24 with right hand numbness and weakness and was diagnosed with a small brainstem stroke.  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  He required intubation and was transferred to [facility] for an interventional radiology intervention.  He ultimately required tracheostomy and PEG tube placement, and has subsequently suffered a prolonged hospital stay.   Significant events:  7/24 presented to [facility], ventral medulla CVA 7/25 tx to [facility], treated with Cleviprex 7/26 cerebral angiogram with stent placement to right vertebro-basilar junction, failed extubation, MRI brain> mild extension of stroke, now involving bilateral medial medullary, patent basilar artery and R VBJ stent  7/28 bedside tracheostomy by PCCM and PEG tube placement by General Surgery 7/31 Bleeding around trach site, bright red blood.  Underwent bronchoscopy, Trach Removed, patient orally reintubated, stoma packed. 8/2 ENT took patient to OR for trach redo 8/9 transferred to medical floor on tracheostomy 8/12 vomiting after bolus feeding with worsening hypoxia 8/13 copious bleeding from the heparin injection site controlled with pressure dressing 8/14 bleeding has resolved.  Trach secretions blood-tinged. 8/15 insurance denied LTAC appeal.  CSW working on SNF. 8/28: peer to peer for LTAC looks like  this was denied 8/29: V. 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  Continue supportive care.  Continue PT OT as tolerated, await SNF bed.  -PER MD [name] on 10/20/2021 duration of dual antiplatelet therapy is 6 months starting from 08/04/2021 thereafter aspirin alone.  Acute respiratory failure with hypoxia (HCC) along with aspiration pneumonia. Patient required ICU admission and mechanical ventilation after admission secondary to worsening mentation. Concern for possible aspiration pneumonitis. Patient unable to extubate. Tracheostomy placed on 7/31 and revised by ENT on 08/11/21 secondary to bleeding. Currently stable.  PCCM for trach management and he has finished antibiotic treatment for his infections.  Continue supportive care and trach hygiene per RT, his trach secretions continue to be persistently high, repeat trach aspiration and cultures done by PCCM on 10/25/2021,  scopolamine patch started on 10/26/2021. PRN lasix,  PCCM monitoring. -culture showing few serratia - await final culture  Alcohol abuse - Initially managed on CIWA.  No signs of DTs now, Continue thiamine, multivitamin and folic acid.  Mixed hyperlipidemia - Continue Lipitor.  Hypertension - Continue clonidine, amlodipine, hydrochlorothiazide and Coreg  NSVT (nonsustained ventricular tachycardia) (HCC) EF 60% - Patient with intermittent Vtach. Most recently with 25 beats of vtach on 08/30/21. QTc of 424 msec, -Replete  potassium and magnesium, Cardiology consult, maintain mag >2 and k >4, no additioonal cardiac w/u.  Continue beta-blocker.  Dysphagia - Patient is s/p PEG tube and is on tube feeds via PEG. Continue tube feeds and free water.  Tracheostomy complication (HCC)-resolved as of 08/25/2021 - In setting of DAPT. ENT consulted for revision. Bleeding resolved.  Hypokalemia and hypomagnesemia.   -stopped HCTZ which likely is worsening his hypokalemia, still on scheduled potassium supplementation monitor potassium levels closely.  Uncontrolled type 2 diabetes mellitus with hyperglycemia (HCC) - Most recent hemoglobin A1C of 8.3%. uncontrolled with hyper- and hypoglycemia.  Levemir along with NovoLog every 4 hours, dose adjusted on 10/20/2021.    Lab Results  Component Value Date   HGBA1C 8.3 (H) 08/03/2021   CBG (last 3)  Recent Labs    10/29/21 2338 10/30/21 0537 10/30/21 1206  GLUCAP 229* 136* 222*           Condition - Extremely Guarded  Family Communication  : None present  Code Status : Full code  Consults  : ENT, PCCM, neurology  PUD Prophylaxis : Pepcid     Inpatient Medications  Scheduled Meds:  amLODipine  10 mg Per Tube Daily   aspirin  81 mg Per Tube Daily   atorvastatin  80 mg Per Tube Daily   carvedilol  37.5 mg Per Tube BID WC   diclofenac Sodium  2 g Topical QID   famotidine  20 mg Per Tube Daily   feeding supplement ([name])  237 mL Per Tube 6 X Daily   feeding supplement (PROSource TF20)  60 mL Per Tube Daily   folic acid  1 mg Per Tube Daily   free water  100 mL Per Tube Q4H   guaiFENesin  10 mL Per Tube Q4H   insulin aspart  0-9 Units Subcutaneous Q6H   insulin detemir  30 Units Subcutaneous BID   multivitamin with minerals  1 tablet Per Tube Daily   mouth rinse  15 mL Mouth Rinse Q2H   potassium chloride  40 mEq Per Tube BID   scopolamine  1 patch Transdermal Q72H   thiamine  100 mg Per Tube Daily   ticagrelor  90 mg Per Tube BID  Continuous Infusions:   PRN Meds:.[DISCONTINUED] acetaminophen **OR** acetaminophen (TYLENOL) oral liquid 160 mg/5 mL **OR** [DISCONTINUED] acetaminophen, bisacodyl, hydrALAZINE, labetalol, liver oil-zinc oxide, mouth rinse, polyethylene glycol, sodium chloride     Objective:   Vitals:   10/30/21 0719 10/30/21 0825 10/30/21 1119 10/30/21 1251  BP: (!) 146/80 (!) 146/80 (!) 141/94   Pulse: 86 92 90 87  Resp: 20 (!) 30 (!) 24 (!) 26  Temp: 98.2 F (36.8 C)  98.8 F (37.1 C)   TempSrc: Oral  Oral   SpO2: 95% 98% 97% 98%  Weight:      Height:        Wt Readings from Last 3 Encounters:  10/04/21 79.8 kg  08/02/21 83.9 kg  02/20/20 88.6 kg     Intake/Output Summary (Last 24 hours) at 10/30/2021 1327 Last data filed at 10/30/2021 1230 Gross per 24 hour  Intake --  Output 1400 ml  Net -1400 ml     Physical Exam     General: Appearance:     Overweight male in no acute distress     Lungs:     respirations unlabored, trach in place  Heart:    Normal heart rate.   MS:   All extremities are intact.   Neurologic:   Awake, alert             Data Review:    CBC Recent Labs  Lab 10/24/21 0649 10/27/21 0728  WBC 7.5 8.3  HGB 13.1 14.0  HCT 38.6* 42.8  PLT 348 373  MCV 82.0 82.9  MCH 27.8 27.1  MCHC 33.9 32.7  RDW 14.6 14.9  LYMPHSABS 1.3 1.5  MONOABS 0.7 0.7  EOSABS 0.5 0.4  BASOSABS 0.1 0.1    Electrolytes Recent Labs  Lab 10/24/21 0649 10/25/21 0656 10/26/21 0346 10/26/21 0817 10/27/21 0728  NA 138 141 140  --  140  K 3.4* 3.5 3.6  --  3.9  CL 104 106 105  --  104  CO2 '25 26 25  '$ --  26  GLUCOSE 155* 224* 123*  --  188*  BUN '11 15 15  '$ --  22  CREATININE 0.56* 0.55* 0.57*  --  0.60*  CALCIUM 9.1 9.0 9.4  --  9.7  AST  --   --   --   --  28  ALT  --   --   --   --  47*  ALKPHOS  --   --   --   --  41  BILITOT  --   --   --   --  0.7  ALBUMIN  --   --   --   --  3.0*  MG  --   --  1.8  --  1.8  BNP 28.7  --   --  15.9  --      Signature  Geradine Girt DO on 10/30/2021 at 1:27 PM   -  To page go to [email or web address]

## 2021-10-31 LAB — GLUCOSE, CAPILLARY
Glucose-Capillary: 198 mg/dL — ABNORMAL HIGH (ref 70–99)
Glucose-Capillary: 221 mg/dL — ABNORMAL HIGH (ref 70–99)
Glucose-Capillary: 91 mg/dL (ref 70–99)

## 2021-10-31 MED ORDER — ORAL CARE MOUTH RINSE
15.0000 mL | OROMUCOSAL | Status: DC
  Administered 2021-10-31 – 2021-11-26 (×104): 15 mL via OROMUCOSAL

## 2021-10-31 MED ORDER — SODIUM CHLORIDE 0.9 % IV SOLN
2.0000 g | Freq: Three times a day (TID) | INTRAVENOUS | Status: AC
  Administered 2021-10-31 – 2021-11-07 (×21): 2 g via INTRAVENOUS
  Filled 2021-10-31 (×21): qty 12.5

## 2021-10-31 NOTE — Progress Notes (Signed)
PROGRESS NOTE                                                                                                                                                                                                             Patient Demographics:    Joshua Donovan, is a 66 y.o. male, DOB - 1955-11-15, PPJ:[id number]  Outpatient Primary MD for the patient is [facility], [place]    LOS - 58  Admit date - 08/03/2021       Brief Narrative (HPI from H&P)   66 year old with a history of ICH, DM 2, HTN, and HLD who presented to [facility] 7/24 with right hand numbness and weakness and was diagnosed with a small brainstem stroke.  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  He required intubation and was transferred to [facility] for an interventional radiology intervention.  He ultimately required tracheostomy and PEG tube placement, and has subsequently suffered a prolonged hospital stay.   Significant events:  7/24 presented to [facility], ventral medulla CVA 7/25 tx to [facility], treated with Cleviprex 7/26 cerebral angiogram with stent placement to right vertebro-basilar junction, failed extubation, MRI brain> mild extension of stroke, now involving bilateral medial medullary, patent basilar artery and R VBJ stent  7/28 bedside tracheostomy by PCCM and PEG tube placement by General Surgery 7/31 Bleeding around trach site, bright red blood.  Underwent bronchoscopy, Trach Removed, patient orally reintubated, stoma packed. 8/2 ENT took patient to OR for trach redo 8/9 transferred to medical floor on tracheostomy 8/12 vomiting after bolus feeding with worsening hypoxia 8/13 copious bleeding from the heparin injection site controlled with pressure dressing 8/14 bleeding has resolved.  Trach secretions blood-tinged. 8/15 insurance denied LTAC appeal.  CSW working on SNF. 8/28: peer to peer for LTAC looks like  this was denied 8/29: V. tach runs - increased metoprolol  9/2: Slightly increased work of breathing-low-grade temps 99 for chest x-ray showed no interval changes 9/5:  Ancef started 2/2 secretions --no wbc or fever 9/12 completed abx course  Awaiting placement     Subjective:   More sputum this AM   Assessment  & Plan :    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[place]) with persistent quadriplegia, dysphagia - s/p stent to right vertebral basilar junction on 08/04/2021, seen by neurology, currently on aspirin,  Brilinta and statin for secondary prevention, seen by stroke team.  Continue supportive care.  Continue PT OT as tolerated, await SNF bed.  -PER MD [name] on 10/20/2021 duration of dual antiplatelet therapy is 6 months starting from 08/04/2021 thereafter aspirin alone.  Acute respiratory failure with hypoxia (HCC) along with aspiration pneumonia. Patient required ICU admission and mechanical ventilation after admission secondary to worsening mentation. Concern for possible aspiration pneumonitis. Patient unable to extubate. Tracheostomy placed on 7/31 and revised by ENT on 08/11/21 secondary to bleeding. Currently stable.  PCCM for trach management and he has finished antibiotic treatment for his infections.  Continue supportive care and trach hygiene per RT, his trach secretions continue to be persistently high, repeat trach aspiration and cultures done by PCCM on 10/25/2021,  scopolamine patch started on 10/26/2021. PRN lasix,  PCCM monitoring. -culture showing few serratia/entero.staph-- will treat with short course of IV abx  Alcohol abuse - Initially managed on CIWA.  No signs of DTs now, Continue thiamine, multivitamin and folic acid.  Mixed hyperlipidemia - Continue Lipitor.  Hypertension - Continue clonidine, amlodipine, hydrochlorothiazide and Coreg  NSVT (nonsustained ventricular tachycardia) (HCC) EF 60% - Patient with intermittent Vtach. Most recently with 25 beats of vtach on  08/30/21. QTc of 424 msec, -Replete potassium and magnesium, Cardiology consult, maintain mag >2 and k >4, no additioonal cardiac w/u.  Continue beta-blocker.  Dysphagia - Patient is s/p PEG tube and is on tube feeds via PEG. Continue tube feeds and free water.  Tracheostomy complication (HCC)-resolved as of 08/25/2021 - In setting of DAPT. ENT consulted for revision. Bleeding resolved.  Hypokalemia and hypomagnesemia.   -stopped HCTZ which likely is worsening his hypokalemia, still on scheduled potassium supplementation monitor potassium levels closely.  Uncontrolled type 2 diabetes mellitus with hyperglycemia (HCC) - Most recent hemoglobin A1C of 8.3%. uncontrolled with hyper- and hypoglycemia.  Levemir along with NovoLog every 4 hours, dose adjusted on 10/20/2021.    Lab Results  Component Value Date   HGBA1C 8.3 (H) 08/03/2021   CBG (last 3)  Recent Labs    10/30/21 2353 10/31/21 0430 10/31/21 1116  GLUCAP 226* 91 221*           Condition - Extremely Guarded  Family Communication  : None present  Code Status : Full code  Consults  : ENT, PCCM, neurology  PUD Prophylaxis : Pepcid     Inpatient Medications  Scheduled Meds:  amLODipine  10 mg Per Tube Daily   aspirin  81 mg Per Tube Daily   atorvastatin  80 mg Per Tube Daily   carvedilol  37.5 mg Per Tube BID WC   diclofenac Sodium  2 g Topical QID   famotidine  20 mg Per Tube Daily   feeding supplement ([name])  237 mL Per Tube 6 X Daily   feeding supplement (PROSource TF20)  60 mL Per Tube Daily   folic acid  1 mg Per Tube Daily   free water  100 mL Per Tube Q4H   guaiFENesin  10 mL Per Tube Q4H   insulin aspart  0-9 Units Subcutaneous Q6H   insulin detemir  30 Units Subcutaneous BID   multivitamin with minerals  1 tablet Per Tube Daily   mouth rinse  15 mL Mouth Rinse 4 times per day   potassium chloride  40 mEq Per Tube BID   scopolamine  1 patch Transdermal Q72H   thiamine  100 mg Per  Tube Daily   ticagrelor  90 mg Per Tube BID   Continuous Infusions:  ceFEPime (MAXIPIME) IV 2 g (10/31/21 1006)    PRN Meds:.[DISCONTINUED] acetaminophen **OR** acetaminophen (TYLENOL) oral liquid 160 mg/5 mL **OR** [DISCONTINUED] acetaminophen, bisacodyl, hydrALAZINE, labetalol, liver oil-zinc oxide, mouth rinse, polyethylene glycol, sodium chloride     Objective:   Vitals:   10/31/21 0419 10/31/21 0719 10/31/21 0859 10/31/21 1117  BP:  121/78  130/75  Pulse: 85 82 78 84  Resp: (!) 33 (!) 26 (!) 26 (!) 29  Temp:  98.3 F (36.8 C)  98.3 F (36.8 C)  TempSrc:  Oral  Oral  SpO2: 98% 94% 94% 97%  Weight:      Height:        Wt Readings from Last 3 Encounters:  10/04/21 79.8 kg  08/02/21 83.9 kg  02/20/20 88.6 kg     Intake/Output Summary (Last 24 hours) at 10/31/2021 1159 Last data filed at 10/31/2021 0331 Gross per 24 hour  Intake --  Output 1300 ml  Net -1300 ml     Physical Exam   General: Appearance:     Overweight male in no acute distress     Lungs:     +sputum, cough stronger, respirations unlabored  Heart:    Normal heart rate. Normal rhythm. No murmurs, rubs, or gallops.   MS:   All extremities are intact.   Neurologic:   Awake, alert           Data Review:    CBC Recent Labs  Lab 10/27/21 0728  WBC 8.3  HGB 14.0  HCT 42.8  PLT 373  MCV 82.9  MCH 27.1  MCHC 32.7  RDW 14.9  LYMPHSABS 1.5  MONOABS 0.7  EOSABS 0.4  BASOSABS 0.1    Electrolytes Recent Labs  Lab 10/25/21 0656 10/26/21 0346 10/26/21 0817 10/27/21 0728  NA 141 140  --  140  K 3.5 3.6  --  3.9  CL 106 105  --  104  CO2 26 25  --  26  GLUCOSE 224* 123*  --  188*  BUN 15 15  --  22  CREATININE 0.55* 0.57*  --  0.60*  CALCIUM 9.0 9.4  --  9.7  AST  --   --   --  28  ALT  --   --   --  47*  ALKPHOS  --   --   --  41  BILITOT  --   --   --  0.7  ALBUMIN  --   --   --  3.0*  MG  --  1.8  --  1.8  BNP  --   --  15.9  --     Signature  Geradine Girt DO on  10/31/2021 at 11:59 AM   -  To page go to [email or web address]

## 2021-10-31 NOTE — Progress Notes (Addendum)
Pharmacy Antibiotic Note  Joshua Donovan is a 66 y.o. male admitted on 08/03/2021 with pneumonia.  Pharmacy has been consulted for cefepime dosing. Tracheal aspirate from 10/16 grew enterobacter aerogenes, serratia marcescens, and MSSA. MD initially was not going to treat, but patient has increased secretions today. SCr stable 0.6 (last from 10/18 - will recheck in AM to confirm)  Plan: Cefepime 2g IV q8h Monitor clinical progress, c/s, renal function F/u LOT - short course recommended per MD   Height: '5\' 7"'$  (170.2 cm) Weight: 79.8 kg (176 lb) IBW/kg (Calculated) : 66.1  Temp (24hrs), Avg:98.7 F (37.1 C), Min:98.3 F (36.8 C), Max:99.1 F (37.3 C)  Recent Labs  Lab 10/25/21 0656 10/26/21 0346 10/27/21 0728  WBC  --   --  8.3  CREATININE 0.55* 0.57* 0.60*    Estimated Creatinine Clearance: 92 mL/min (A) (by C-G formula based on SCr of 0.6 mg/dL (L)).    Allergies  Allergen Reactions   Lisinopril Anaphylaxis and Swelling    Angioedema (filled but not taking per family, 08/08/21).  TDD.   Anchovies [Fish Allergy] Swelling   Sulfa Antibiotics Swelling    Arturo Morton, PharmD, BCPS Please check AMION for all [place] contact numbers Clinical Pharmacist 10/31/2021 8:18 AM

## 2021-11-01 DIAGNOSIS — Y95 Nosocomial condition: Secondary | ICD-10-CM

## 2021-11-01 LAB — BASIC METABOLIC PANEL
Anion gap: 10 (ref 5–15)
BUN: 12 mg/dL (ref 8–23)
CO2: 23 mmol/L (ref 22–32)
Calcium: 8.9 mg/dL (ref 8.9–10.3)
Chloride: 105 mmol/L (ref 98–111)
Creatinine, Ser: 0.51 mg/dL — ABNORMAL LOW (ref 0.61–1.24)
GFR, Estimated: >60 mL/min (ref >60)
Glucose, Bld: 116 mg/dL — ABNORMAL HIGH (ref 70–99)
Potassium: 3.7 mmol/L (ref 3.5–5.1)
Sodium: 138 mmol/L (ref 135–145)

## 2021-11-01 LAB — GLUCOSE, CAPILLARY
Glucose-Capillary: 138 mg/dL — ABNORMAL HIGH (ref 70–99)
Glucose-Capillary: 149 mg/dL — ABNORMAL HIGH (ref 70–99)
Glucose-Capillary: 183 mg/dL — ABNORMAL HIGH (ref 70–99)
Glucose-Capillary: 225 mg/dL — ABNORMAL HIGH (ref 70–99)

## 2021-11-01 NOTE — Progress Notes (Signed)
PROGRESS NOTE                                                                                                                                                                                                             Patient Demographics:    Joshua Donovan, is a 66 y.o. 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  He required intubation and was transferred to [facility] for an interventional radiology intervention.  He ultimately required tracheostomy and PEG tube placement, and has subsequently suffered a prolonged hospital stay.   Significant events:  7/24 presented to [facility], ventral medulla CVA 7/25 tx to [facility], treated with Cleviprex 7/26 cerebral angiogram with stent placement to right vertebro-basilar junction, failed extubation, MRI brain> mild extension of stroke, now involving bilateral medial medullary, patent basilar artery and R VBJ stent  7/28 bedside tracheostomy by PCCM and PEG tube placement by General Surgery 7/31 Bleeding around trach site, bright red blood.  Underwent bronchoscopy, Trach Removed, patient orally reintubated, stoma packed. 8/2 ENT took patient to OR for trach redo 8/9 transferred to medical floor on tracheostomy 8/12 vomiting after bolus feeding with worsening hypoxia 8/13 copious bleeding from the heparin injection site controlled with pressure dressing 8/14 bleeding has resolved.  Trach secretions blood-tinged. 8/15 insurance denied LTAC appeal.  CSW working on SNF. 8/28: peer to peer for LTAC looks like  this was denied 8/29: V. 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  Continue supportive care.  Continue PT OT as tolerated, await SNF bed.  -PER MD [name] on 10/20/2021 duration of dual antiplatelet therapy is 6 months starting from 08/04/2021 thereafter aspirin alone.  Acute respiratory failure with hypoxia (HCC) along with aspiration pneumonia. Patient required ICU admission and mechanical ventilation after admission secondary to worsening mentation. Concern for possible aspiration pneumonitis. Patient unable to extubate. Tracheostomy placed on 7/31 and revised by ENT on 08/11/21 secondary to bleeding. Currently stable.  PCCM for trach management and he has finished antibiotic treatment for his infections.  Continue supportive care and trach hygiene per RT, his trach secretions continue to be persistently high, repeat trach aspiration and cultures done by PCCM on 10/25/2021,  scopolamine patch started on 10/26/2021. PRN lasix,  PCCM monitoring. -culture showing few serratia/entero.staph-- will treat with short course of IV abx  Alcohol abuse - Initially managed on CIWA.  No signs of DTs now, Continue thiamine, multivitamin and folic acid.  Mixed hyperlipidemia - Continue Lipitor.  Hypertension - Continue clonidine, amlodipine, hydrochlorothiazide and Coreg  NSVT (nonsustained ventricular tachycardia) (HCC) EF 60% - Patient with intermittent Vtach. Most recently with 25 beats of vtach on  08/30/21. QTc of 424 msec, -Replete potassium and magnesium, Cardiology consult, maintain mag >2 and k >4, no additioonal cardiac w/u.  Continue beta-blocker.  Dysphagia - Patient is s/p PEG tube and is on tube feeds via PEG. Continue tube feeds and free water.  Tracheostomy complication (HCC)-resolved as of 08/25/2021 - In setting of DAPT. ENT consulted for revision. Bleeding resolved.  Hypokalemia and hypomagnesemia.   -stopped HCTZ which likely is worsening his hypokalemia, still on scheduled potassium supplementation monitor potassium levels closely.  Uncontrolled type 2 diabetes mellitus with hyperglycemia (HCC) - Most recent hemoglobin A1C of 8.3%. uncontrolled with hyper- and hypoglycemia.  Levemir along with NovoLog every 4 hours, dose adjusted on 10/20/2021.    Lab Results  Component Value Date   HGBA1C 8.3 (H) 08/03/2021   CBG (last 3)  Recent Labs    10/31/21 1116 10/31/21 2347 11/01/21 0614  GLUCAP 221* 198* 138*           Condition - Extremely Guarded  Family Communication  : None present  Code Status : Full code  Consults  : ENT, PCCM, neurology  PUD Prophylaxis : Pepcid     Inpatient Medications  Scheduled Meds:  amLODipine  10 mg Per Tube Daily   aspirin  81 mg Per Tube Daily   atorvastatin  80 mg Per Tube Daily   carvedilol  37.5 mg Per Tube BID WC   diclofenac Sodium  2 g Topical QID   famotidine  20 mg Per Tube Daily   feeding supplement ([name])  237 mL Per Tube 6 X Daily   feeding supplement (PROSource TF20)  60 mL Per Tube Daily   folic acid  1 mg Per Tube Daily   free water  100 mL Per Tube Q4H   guaiFENesin  10 mL Per Tube Q4H   insulin aspart  0-9 Units Subcutaneous Q6H   insulin detemir  30 Units Subcutaneous BID   multivitamin with minerals  1 tablet Per Tube Daily   mouth rinse  15 mL Mouth Rinse 4 times per day   potassium chloride  40 mEq Per Tube BID   scopolamine  1 patch Transdermal Q72H   thiamine  100 mg Per  Tube Daily   ticagrelor  90 mg Per Tube BID   Continuous Infusions:  ceFEPime (MAXIPIME) IV 2 g (11/01/21 0830)    PRN Meds:.[DISCONTINUED] acetaminophen **OR** acetaminophen (TYLENOL) oral liquid 160 mg/5 mL **OR** [DISCONTINUED] acetaminophen, bisacodyl, hydrALAZINE, labetalol, liver oil-zinc oxide, mouth rinse, polyethylene glycol, sodium chloride     Objective:   Vitals:   11/01/21 0102 11/01/21 0319 11/01/21 0419 11/01/21 0840  BP:  133/84    Pulse: 82 82 81 96  Resp: (!) 22 (!) 22 (!) 23 (!) 28  Temp:  98.3 F (36.8 C)    TempSrc:  Axillary    SpO2: 98% 96% 96% 96%  Weight:      Height:        Wt Readings from Last 3 Encounters:  10/04/21 79.8 kg  08/02/21 83.9 kg  02/20/20 88.6 kg     Intake/Output Summary (Last 24 hours) at 11/01/2021 1049 Last data filed at 11/01/2021 0319 Gross per 24 hour  Intake 774 ml  Output 1200 ml  Net -426 ml     Physical Exam   General: Appearance:     Overweight male in no acute distress     Lungs:     respirations unlabored, trach in place  Heart:    Normal heart rate.  MS:   All extremities are intact.   Neurologic:   Awake, alert, will mouth words           Data Review:    CBC Recent Labs  Lab 10/27/21 0728  WBC 8.3  HGB 14.0  HCT 42.8  PLT 373  MCV 82.9  MCH 27.1  MCHC 32.7  RDW 14.9  LYMPHSABS 1.5  MONOABS 0.7  EOSABS 0.4  BASOSABS 0.1    Electrolytes Recent Labs  Lab 10/26/21 0346 10/26/21 0817 10/27/21 0728 11/01/21 0314  NA 140  --  140 138  K 3.6  --  3.9 3.7  CL 105  --  104 105  CO2 25  --  26 23  GLUCOSE 123*  --  188* 116*  BUN 15  --  22 12  CREATININE 0.57*  --  0.60* 0.51*  CALCIUM 9.4  --  9.7 8.9  AST  --   --  28  --   ALT  --   --  47*  --   ALKPHOS  --   --  41  --   BILITOT  --   --  0.7  --   ALBUMIN  --   --  3.0*  --   MG 1.8  --  1.8  --   BNP  --  15.9  --   --     Signature  Geradine Girt DO on 11/01/2021 at 10:49 AM   -  To page go to [email or web address]

## 2021-11-01 NOTE — Progress Notes (Signed)
NAME:  Joshua Donovan, MRN:  [id number], DOB:  [birth year], LOS: 33 ADMISSION DATE:  08/03/2021, CONSULTATION DATE:  7/26 REFERRING MD:  Colvin Caroli FOR CONSULT:  CVA   History of Present Illness:  66 y/o male presented to [facility] on 7/24 with R hand numbness and weakness, found to have small brainstem stroke in ventral medulla.  Symptoms worsened and required transfer to [facility] for neuro IR intervention where he had a stent placed in R vertebrovasilar junction.  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  He had a tracheostomy performed on 7/26 c/b dislodgement and ENT revision on 8/2.  Pertinent  Medical History  TBI with ICH, DM, HTN, HLD  Significant Hospital Events: Including procedures, antibiotic start and stop dates in addition to other pertinent events    7/24 presented to [facility], ventral medulla CVA 7/25 tx to [facility] 7/26 cerebral angiogram with stent placement to right vertebro-basilar junction, failed extubation, left radial aline MRI brain> mild extension of stroke, now involving bilateral medial medullary, patent basilar artery and R VBJ stent  7/28 underwent bedside percutaneous tracheostomy and PEG tube placement, tolerated well 7/29 no acute issues overnight currently on SBT trial this a.m. and tolerating well, Cleviprex resumed earlier this a.m. due to severe hypertension 7/30 Hypertension persist despite adding oral agents, glucose remains elevated as well 7/31 Bleeding around trach site, bright red blood. Copious bloody secretions. Cleviprex off, SBPs 150s-160s. Basal insulin/TF coverage increased. Cefepime deescalated to ceftriaxone. CXR stable. Later in afternoon, continued peritrach bleeding. Bronchoscopic eval completed with intratracheal bleeding associated with trach. Removed, patient orally reintubated, stoma packed. 8/1 ENT consulted for trach revision. Lightly sedated. Vent full support/PRVC, given fatigue following events of yesterday. ENT attempted to  evaluate trach at bedside, could not pass scope. Plan for OR 8/2. ASA/Brilinta held. 8/2 to OR for trach redo 8/6 trach collar during day shift, back on vent overnight 8/10 Hemoptysis, blood with suctioning > improved 8/11 8/14 on 8L / 35% ATC  8/28: Stable vitals, increased tenacious secretions from trach. CXR suggesting new LLL basilar opacities atelectasis vs pna 09/07/2021 Decreased volume and thickness of secretions. No fevers. Tracheal aspirate from 8/28 with mod gram positive cocci and few gram negative rods today, culture pending. 9/5 start cefazolin x 7 days for MSSA pneumonia 9/8 Trach exchange 10/16 trach aspirate -- few enterobacter aerogenes, few serratia marcescens, moderate staph aureus  10/22 started on cefepime    Interim History / Subjective:    Started on cefepime 10/22    Objective   Blood pressure (!) 146/83, pulse 83, temperature (!) 97.5 F (36.4 C), temperature source Oral, resp. rate (!) 27, height '5\' 7"'$  (1.702 m), weight 79.8 kg, SpO2 97 %.    FiO2 (%):  [21 %] 21 %   Intake/Output Summary (Last 24 hours) at 11/01/2021 1206 Last data filed at 11/01/2021 1205 Gross per 24 hour  Intake 574 ml  Output 1700 ml  Net -1126 ml   Filed Weights   09/22/21 0459 10/02/21 0500 10/04/21 0500  Weight: 85.7 kg 79.8 kg 79.8 kg    Examination: Pleasant, chronically ill middle aged M supine in bed NAD  NCAT. 6 cuffless Trach secure with some thick tan secretions Even unlabored respirations. Scattered rhonchi RRR cap refill < 3 sec  Soft abdomen No acute joint deformity Extremity edema, heels floated  Following commands. Cannot move extremities. 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

## 2021-11-02 LAB — GLUCOSE, CAPILLARY
Glucose-Capillary: 90 mg/dL (ref 70–99)
Glucose-Capillary: 179 mg/dL — ABNORMAL HIGH (ref 70–99)
Glucose-Capillary: 232 mg/dL — ABNORMAL HIGH (ref 70–99)
Glucose-Capillary: 271 mg/dL — ABNORMAL HIGH (ref 70–99)
Glucose-Capillary: 222 mg/dL — ABNORMAL HIGH (ref 70–99)

## 2021-11-02 MED ORDER — FUROSEMIDE 10 MG/ML IJ SOLN
20.0000 mg | Freq: Once | INTRAMUSCULAR | Status: AC
  Administered 2021-11-02: 20 mg via INTRAVENOUS
  Filled 2021-11-02: qty 2

## 2021-11-02 NOTE — Progress Notes (Signed)
Occupational Therapy Treatment Patient Details Name: Joshua Donovan MRN: [id number] DOB: [birth year] Today's Date: 11/02/2021   History of present illness Pt is a 66 y.o. male who presented 08/02/21 with R foot pain and R-sided weakness. Transferred to [facility] 7/25. 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 S/p stent placement to the R vertebrobasilar junction 7/26, failed extubation. Trach and PEG placed 7/28. S/p bronchoscopic evaluation, oral reintubation and tracheostomy removal with stoma packing 7/31 due to trach site bleeding. S/p trach revision 8/2. PMH: DM, GERD, TBI with prior ICH, HLD, HTN   OT comments  Pt still with slow progress toward sitting, balance, head control, transfers, and simple selfcare tasks.  Able to sit EOB for over 10 mins during session with work on activation of cervical extension, rotation, and lateral flexion to each side.  Overall mod assist for cervical rotation small degrees side to side with min to mod for cervical extension.  Still needs total +2 for all bed mobility and transfers at this time.  Recommend continued acute care OT to help progress toward updated LTGs.    Recommendations for follow up therapy are one component of a multi-disciplinary discharge planning process, led by the attending physician.  Recommendations may be updated based on patient status, additional functional criteria and insurance authorization.    Follow Up Recommendations  Skilled nursing-short term rehab (<3 hours/day)    Assistance Recommended at Discharge Frequent or constant Supervision/Assistance  Patient can return  home with the following  Two people to help with walking and/or transfers;Two people to help with bathing/dressing/bathroom;Assistance with cooking/housework;Assistance with feeding;Direct supervision/assist for medications management;Direct supervision/assist for financial management;Assist for transportation;Help with stairs or ramp for entrance   Equipment Recommendations  Other (comment) (TBD next venue of care)       Precautions / Restrictions Precautions Precautions: Fall Precaution Comments: trach collar, PEG, SBP < 180,  PMV on during waking hours if O2 sats are good Restrictions Weight Bearing Restrictions: No       Mobility Bed Mobility Overal bed mobility: Needs Assistance Bed Mobility: Rolling Rolling: Total assist, +2 for physical assistance Sidelying to sit: Total assist, +2 for physical assistance            Transfers Overall transfer level: Needs assistance Equipment used: None Transfers: Bed to chair/wheelchair/BSC     Squat pivot transfers: Total assist, +2 physical assistance       General transfer comment: Squat pivot to the wheelchair with use of the bed pad.     Balance Overall balance assessment: Needs assistance Sitting-balance support: Feet supported Sitting balance-Leahy Scale: Zero Sitting balance - Comments: total assist is needed for static sitting balance     Standing balance-Leahy Scale: Zero Standing balance comment: unable to complete at this time                           ADL either performed or assessed with clinical judgement   ADL Overall ADL's : Needs assistance/impaired                     Lower Body Dressing: Total assistance;Bed level Lower Body Dressing Details (indicate  cue type and reason): gripper socks     Toileting- Clothing Manipulation and Hygiene: Total assistance;Bed level;+2 for safety/equipment Toileting - Clothing Manipulation Details (indicate cue type and reason): total assist for  cleaning up buttocks and applying foam dressing     Functional mobility during ADLs: Total assistance;+2 for safety/equipment;+2 for physical assistance General ADL Comments: Pt transitioned to EOB with total +2 assist and worked on head control and sitting posture.  Focused on activation of cervical extension and being able to hold in that position at midline as well as holding with slight rotations left to right.  Pt overall needing mod assist for maintaining cervical extension over approximately 30 seconds, but demonstrates some short intervals of being able to maintain without assist once positioned for less than 30 seconds.  Max assist for head tilts side to side as well with total assist for static sitting balance.  Completed squat pivot transfer to the tilt in space wheelchair at end of session with pt tilted back and pillows positined for midline orientation of trunk, head, and pelvis.      Cognition Arousal/Alertness: Awake/alert Behavior During Therapy: WFL for tasks assessed/performed Overall Cognitive Status: Difficult to assess                         Following Commands: Follows one step commands consistently (Pt attempts to follow commands but is limited secondary to decreased motor output.)       General Comments: PMSV in place, pt vocalizing words with decreased volume.                   Pertinent Vitals/ Pain       Pain Assessment Pain Assessment: Faces Pain Score: 0-No pain         Frequency  Min 2X/week        Progress Toward Goals  OT Goals(current goals can now be found in the care plan section)  Progress towards OT goals: Not progressing toward goals - comment (Pt slower progress than expected will update goals)  Acute Rehab OT Goals Patient Stated Goal: Pt agreeable to working in OT OT Goal Formulation: With patient Time For Goal Achievement: 11/16/21 Potential to Achieve Goals: [place] Discharge plan remains appropriate     Co-evaluation    PT/OT/SLP Co-Evaluation/Treatment: Yes Reason for Co-Treatment: For patient/therapist safety;Complexity of the patient's impairments (multi-system involvement)   OT goals addressed during session: Strengthening/ROM         End of Session Equipment Utilized During Treatment: Oxygen  OT Visit Diagnosis: Other symptoms and signs involving the nervous system (R29.898);Muscle weakness (generalized) (M62.81);Feeding difficulties (R63.3);Other abnormalities of gait and mobility (R26.89);Unsteadiness on feet (R26.81);Other (comment)   Activity Tolerance Patient tolerated treatment well   Patient Left with call bell/phone within reach;in chair;with nursing/sitter in room;with family/visitor present   Nurse Communication Need for lift equipment        Time: 1210-1257 OT Time Calculation (min): 47 min  Charges: OT General Charges $OT Visit: 1 Visit OT Treatments $Self Care/Home Management : 23-37 mins  Jereld Presti OTR/L 11/02/2021, 2:14 PM

## 2021-11-02 NOTE — Progress Notes (Signed)
Nutrition Follow-up  DOCUMENTATION CODES:   Not applicable  INTERVENTION:   Continue TF via PEG: Jevity 1.5 237 ml (1 carton) 6 times per day. Prosource TF20 60 ml daily. Free water flushes 100 ml every 4 hours.  Provides 2210 kcal, 111 gm protein, 1680 ml free water daily.  MVI with minerals 1 tablet via tube once daily.  NUTRITION DIAGNOSIS:   Inadequate oral intake related to acute illness as evidenced by NPO status.  Ongoing   GOAL:   Patient will meet greater than or equal to 90% of their needs  Met with TF  MONITOR:   Vent status, Labs, Weight trends, TF tolerance  REASON FOR ASSESSMENT:   Consult Enteral/tube feeding initiation and management  ASSESSMENT:   66 yo male admitted post brain stem stroke involving ventral medulla requiring stent placement to right vertebro-basilar junction. PMH includes HTN, TBI, DM, HLD  Patient is currently on trach collar. Remains NPO.   PEG in place. Receiving bolus feedings of Jevity 1.5 1 carton (237 ml) 6 times per day with Prosource TF20 60 ml once daily. Free water flushes 100 ml every 4 hours. Patient is tolerating well to meet 100% of nutrition needs.  Labs reviewed.  CBG: [id number]  Medications reviewed and include folic acid, Novolog, Levemir, MVI with minerals, Klor-Con, thiamine.  UOP 800 ml x 24 hours  Admission weight 83.9 kg No new weight since 9/25 (79.8 kg)  Diet Order:   Diet Order             Diet NPO time specified  Diet effective now                   EDUCATION NEEDS:   Not appropriate for education at this time  Skin:  Skin Assessment: Skin Integrity Issues: Skin Integrity Issues:: Other (Comment) Other: non-pressure wound bilateral buttock  Last BM:  10/23  Height:   Ht Readings from Last 1 Encounters:  08/04/21 _0  (1.702 m)    Weight:   Wt Readings from Last 1 Encounters:  10/04/21 79.8 kg    BMI:  Body mass index is 27.57 kg/m.  Estimated Nutritional Needs:    Kcal:  2000-2200 kcals  Protein:  100-115 g  Fluid:  >/= 2 L   Lucas Mallow RD, LDN, CNSC Please refer to Amion for contact information.

## 2021-11-02 NOTE — Progress Notes (Signed)
Speech Language Pathology Treatment: Cognitive-Linquistic  Patient Details Name: Joshua Donovan MRN: [id number] DOB: 1955/12/31 Today's Date: 11/02/2021 Time: 0922-0938 SLP Time Calculation (min) (ACUTE ONLY): 16 min  Assessment / Plan / Recommendation Clinical Impression  Joshua Donovan still unintelligible at phrase level. SLP cued Joshua Donovan in reciting the alphabet, repeating functional phrases one word, on breath with a goal of audible phonation on each word. Joshua Donovan achieved this repeatedly. Joshua Donovan also able to spell his name audibly. Again when trying to express a novel phrase Joshua Donovan did not carry over and was unintelligible. Joshua Donovan completed 10 reps of EMST set to 10Cm H20. Making progress but communication deficits persist.   HPI HPI: Joshua Donovan is a 66 y.o. 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 He  presented 08/02/21 with R foot pain and R-sided weakness. Transferred to [facility] 7/25. 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 S/p stent placement to the R vertebrobasilar junction 7/26, failed extubation. Trach and PEG placed 7/28. PMH: DM, GERD, TBI with prior ICH, HLD, HTN      SLP Plan  Continue with current plan of care      Recommendations for follow up therapy are one component of a multi-disciplinary discharge planning process, led by the attending physician.  Recommendations may be updated based on patient status, additional functional criteria and insurance authorization.    Recommendations         Patient may use Passy-Muir Speech Valve: During all waking hours (remove during sleep) PMSV Supervision: Intermittent         Plan: Continue with current plan of  care           Elis Sauber, [name]  11/02/2021, 11:41 AM

## 2021-11-02 NOTE — Progress Notes (Signed)
PROGRESS NOTE                                                                                                                                                                                                             Patient Demographics:    Joshua Donovan, is a 66 y.o. male, DOB - 1955/03/16, EPP:[id number]  Outpatient Primary MD for the patient is [facility], [facility]    LOS - 67  Admit date - 08/03/2021       Brief Narrative (HPI from H&P)   66 year old with a history of ICH, DM 2, HTN, and HLD who presented to [facility] 7/24 with right hand numbness and weakness and was diagnosed with a small brainstem stroke.  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  He required intubation and was transferred to [facility] for an interventional radiology intervention.  He ultimately required tracheostomy and PEG tube placement, and has subsequently suffered a prolonged hospital stay.   Significant events:  7/24 presented to [facility], ventral medulla CVA 7/25 tx to [facility], treated with Cleviprex 7/26 cerebral angiogram with stent placement to right vertebro-basilar junction, failed extubation, MRI brain> mild extension of stroke, now involving bilateral medial medullary, patent basilar artery and R VBJ stent  7/28 bedside tracheostomy by PCCM and PEG tube placement by General Surgery 7/31 Bleeding around trach site, bright red blood.  Underwent bronchoscopy, Trach Removed, patient orally reintubated, stoma packed. 8/2 ENT took patient to OR for trach redo 8/9 transferred to medical floor on tracheostomy 8/12 vomiting after bolus feeding with worsening hypoxia 8/13 copious bleeding from the heparin injection site controlled with pressure dressing 8/14 bleeding has resolved.  Trach secretions blood-tinged. 8/15 insurance denied LTAC appeal.  CSW working on SNF. 8/28: peer to peer for LTAC looks like  this was denied 8/29: V. 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  Continue supportive care.  Continue PT OT as tolerated, await SNF bed.  -PER MD [name] on 10/20/2021 duration of dual antiplatelet therapy is 6 months starting from 08/04/2021 thereafter aspirin alone.  Acute respiratory failure with hypoxia (HCC) along with aspiration pneumonia. Patient required ICU admission and mechanical ventilation after admission secondary to worsening mentation. Concern for possible aspiration pneumonitis. Patient unable to extubate. Tracheostomy placed on 7/31 and revised by ENT on 08/11/21 secondary to bleeding. Currently stable.  PCCM for trach management and he has finished antibiotic treatment for his infections.  Continue supportive care and trach hygiene per RT, his trach secretions continue to be persistently high, repeat trach aspiration and cultures done by PCCM on 10/25/2021,  scopolamine patch started on 10/26/2021. PRN lasix,  PCCM monitoring. -sputum culture showing few serratia/entero.staph-- will treat with short course of IV abx (started 10/22)  Alcohol abuse - Initially managed on CIWA.  No signs of DTs now, Continue thiamine, multivitamin and folic acid.  Mixed hyperlipidemia - Continue Lipitor.  Hypertension - Continue clonidine, amlodipine, hydrochlorothiazide and Coreg  NSVT (nonsustained ventricular tachycardia) (HCC) EF 60% - Patient  with intermittent Vtach. Most recently with 25 beats of vtach on 08/30/21. QTc of 424 msec, -Replete potassium and magnesium, Cardiology consult, maintain mag >2 and k >4, no additioonal cardiac w/u.  Continue beta-blocker.  Dysphagia - Patient is s/p PEG tube and is on tube feeds via PEG. Continue tube feeds and free water.  Tracheostomy complication (HCC)-resolved as of 08/25/2021 - In setting of DAPT. ENT consulted for revision. Bleeding resolved.  Hypokalemia and hypomagnesemia.   -stopped HCTZ which likely is worsening his hypokalemia, still on scheduled potassium supplementation monitor potassium levels closely.  Uncontrolled type 2 diabetes mellitus with hyperglycemia (HCC) - Most recent hemoglobin A1C of 8.3%. uncontrolled with hyper- and hypoglycemia.  Levemir along with NovoLog every 4 hours, dose adjusted on 10/20/2021.        Condition - Extremely Guarded  Family Communication  : None present  Code Status : Full code  Consults  : ENT, PCCM, neurology  PUD Prophylaxis : Pepcid     Inpatient Medications  Scheduled Meds:  amLODipine  10 mg Per Tube Daily   aspirin  81 mg Per Tube Daily   atorvastatin  80 mg Per Tube Daily   carvedilol  37.5 mg Per Tube BID WC   diclofenac Sodium  2 g Topical QID   famotidine  20 mg Per Tube Daily   feeding supplement ([name])  237 mL Per Tube 6 X Daily   feeding supplement (PROSource TF20)  60 mL Per Tube Daily   folic acid  1 mg Per Tube Daily   free water  100 mL Per Tube Q4H   guaiFENesin  10 mL Per Tube Q4H   insulin aspart  0-9 Units Subcutaneous Q6H   insulin detemir  30 Units Subcutaneous BID   multivitamin with minerals  1 tablet Per Tube Daily   mouth rinse  15 mL Mouth Rinse 4 times per day   potassium chloride  40 mEq Per Tube BID   scopolamine  1 patch Transdermal Q72H   thiamine  100 mg Per Tube Daily   ticagrelor  90 mg Per Tube BID   Continuous Infusions:  ceFEPime (MAXIPIME) IV 2 g (11/02/21  0159)    PRN Meds:.[DISCONTINUED] acetaminophen **OR** acetaminophen (TYLENOL) oral liquid 160  mg/5 mL **OR** [DISCONTINUED] acetaminophen, bisacodyl, hydrALAZINE, labetalol, liver oil-zinc oxide, mouth rinse, polyethylene glycol, sodium chloride     Objective:   Vitals:   11/01/21 2331 11/02/21 0321 11/02/21 0323 11/02/21 0324  BP:  (!) 152/88    Pulse: 91 77 78 78  Resp: (!) 32 (!) 33 (!) 22 (!) 39  Temp:  98.5 F (36.9 C)    TempSrc:  Oral    SpO2: 96% 92%  96%  Weight:      Height:        Wt Readings from Last 3 Encounters:  10/04/21 79.8 kg  08/02/21 83.9 kg  02/20/20 88.6 kg     Intake/Output Summary (Last 24 hours) at 11/02/2021 0800 Last data filed at 11/01/2021 2206 Gross per 24 hour  Intake --  Output 800 ml  Net -800 ml     Physical Exam   General: Appearance:     Overweight male in no acute distress     Lungs:     respirations unlabored, trach in place  Heart:    Normal heart rate.  MS:   All extremities are intact.   Neurologic:   Awake, alert, will mouth words           Data Review:    CBC Recent Labs  Lab 10/27/21 0728  WBC 8.3  HGB 14.0  HCT 42.8  PLT 373  MCV 82.9  MCH 27.1  MCHC 32.7  RDW 14.9  LYMPHSABS 1.5  MONOABS 0.7  EOSABS 0.4  BASOSABS 0.1    Electrolytes Recent Labs  Lab 10/26/21 0817 10/27/21 0728 11/01/21 0314  NA  --  140 138  K  --  3.9 3.7  CL  --  104 105  CO2  --  26 23  GLUCOSE  --  188* 116*  BUN  --  22 12  CREATININE  --  0.60* 0.51*  CALCIUM  --  9.7 8.9  AST  --  28  --   ALT  --  47*  --   ALKPHOS  --  41  --   BILITOT  --  0.7  --   ALBUMIN  --  3.0*  --   MG  --  1.8  --   BNP 15.9  --   --     Signature  Geradine Girt DO on 11/02/2021 at 8:00 AM   -  To page go to [email or web address]

## 2021-11-02 NOTE — Progress Notes (Signed)
Physical Therapy Treatment Patient Details Name: Joshua Donovan MRN: [id number] DOB: 22-Jul-1955 Today's Date: 11/02/2021   History of Present Illness Pt is a 66 y.o. male who presented 08/02/21 with R foot pain and R-sided weakness. Transferred to [facility] 7/25. 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 S/p stent placement to the R vertebrobasilar junction 7/26, failed extubation. Trach and PEG placed 7/28. S/p bronchoscopic evaluation, oral reintubation and tracheostomy removal with stoma packing 7/31 due to trach site bleeding. S/p trach revision 8/2. PMH: DM, GERD, TBI with prior ICH, HLD, HTN    PT Comments    Emphasis this session on sitting balance, cervical ROM/head control, and transfers out of bed. Pt attempting to vocalize with PMSV donned, but continues with low volume and therefore difficult to understand. Pt continues to require two person total assist for functional mobility. Continue to recommend SNF for ongoing Physical Therapy.      Recommendations for follow up therapy are one component of a multi-disciplinary discharge planning process, led by the attending physician.  Recommendations may be updated based on patient status, additional functional criteria and insurance authorization.  Follow Up Recommendations  Skilled nursing-short term rehab (<3 hours/day) Can patient physically be transported by private vehicle: No   Assistance Recommended at Discharge Frequent or constant Supervision/Assistance  Patient can return home with the following Assistance with cooking/housework;Two people to help with walking and/or  transfers;Two people to help with bathing/dressing/bathroom;Assistance with feeding;Direct supervision/assist for medications management;Direct supervision/assist for financial management;Assist for transportation;Help with stairs or ramp for entrance   Equipment Recommendations  Other (comment) (defer)    Recommendations for Other Services       Precautions / Restrictions Precautions Precautions: Fall Precaution Comments: trach collar, PEG, SBP < 180,  PMV on during waking hours if O2 sats are good Restrictions Weight Bearing Restrictions: No     Mobility  Bed Mobility Overal bed mobility: Needs Assistance Bed Mobility: Rolling Rolling: Total assist, +2 for physical assistance Sidelying to sit: Total assist, +2 for physical assistance            Transfers Overall transfer level: Needs assistance Equipment used: None Transfers: Bed to chair/wheelchair/BSC       Squat pivot transfers: Total assist, +2 physical assistance     General transfer comment: Squat pivot to the wheelchair with use of the bed pad.    Ambulation/Gait                   Stairs             Wheelchair Mobility    Modified Rankin (Stroke Patients Only)       Balance Overall balance assessment: Needs assistance Sitting-balance support: Feet supported Sitting balance-Leahy Scale: Zero Sitting balance - Comments: total assist is needed for static sitting balance                                    Cognition Arousal/Alertness: Awake/alert Behavior During Therapy: WFL for tasks assessed/performed Overall Cognitive Status: Difficult to assess  General Comments: PMSV in place, pt vocalizing words with decreased volume.        Exercises Other Exercises Other Exercises: Sitting: cervical rotation to R/L, cervical extension, cervical lateral flexion Other Exercises: Supine: PROM to BLE's including knee flexion and  ankle dorsiflexion x 10 each    General Comments        Pertinent Vitals/Pain Pain Assessment Pain Assessment: Faces Faces Pain Scale: No hurt    Home Living                          Prior Function            PT Goals (current goals can now be found in the care plan section) Acute Rehab PT Goals Potential to Achieve Goals: Fair    Frequency    Min 2X/week      PT Plan Current plan remains appropriate    Co-evaluation PT/OT/SLP Co-Evaluation/Treatment: Yes Reason for Co-Treatment: For patient/therapist safety;To address functional/ADL transfers;Complexity of the patient's impairments (multi-system involvement) PT goals addressed during session: Mobility/safety with mobility;Balance;Strengthening/ROM OT goals addressed during session: Strengthening/ROM      AM-PAC PT "6 Clicks" Mobility   Outcome Measure  Help needed turning from your back to your side while in a flat bed without using bedrails?: Total Help needed moving from lying on your back to sitting on the side of a flat bed without using bedrails?: Total Help needed moving to and from a bed to a chair (including a wheelchair)?: Total Help needed standing up from a chair using your arms (e.g., wheelchair or bedside chair)?: Total Help needed to walk in hospital room?: Total Help needed climbing 3-5 steps with a railing? : Total 6 Click Score: 6    End of Session Equipment Utilized During Treatment: Other (comment) (medical air) Activity Tolerance: Patient tolerated treatment well Patient left: in chair;with call bell/phone within reach Nurse Communication: Need for lift equipment;Mobility status PT Visit Diagnosis: Muscle weakness (generalized) (M62.81);Other symptoms and signs involving the nervous system (R29.898);Other abnormalities of gait and mobility (R26.89);Other (comment) Hemiplegia - Right/Left: Right Hemiplegia - dominant/non-dominant: Dominant Hemiplegia - caused by: Cerebral  infarction     Time: 1208-1257 PT Time Calculation (min) (ACUTE ONLY): 49 min  Charges:  $Therapeutic Activity: 8-22 mins                     Wyona Almas, PT, DPT Acute Rehabilitation Services Office [phone]    Deno Etienne 11/02/2021, 4:48 PM

## 2021-11-03 LAB — CBC WITH DIFFERENTIAL/PLATELET
Abs Immature Granulocytes: 0.03 10*3/uL (ref 0.00–0.07)
Basophils Absolute: 0 10*3/uL (ref 0.0–0.1)
Basophils Relative: 1 %
Eosinophils Absolute: 0.5 10*3/uL (ref 0.0–0.5)
Eosinophils Relative: 6 %
HCT: 37 % — ABNORMAL LOW (ref 39.0–52.0)
Hemoglobin: 12.3 g/dL — ABNORMAL LOW (ref 13.0–17.0)
Immature Granulocytes: 0 %
Lymphocytes Relative: 14 %
Lymphs Abs: 1.1 10*3/uL (ref 0.7–4.0)
MCH: 27.1 pg (ref 26.0–34.0)
MCHC: 33.2 g/dL (ref 30.0–36.0)
MCV: 81.5 fL (ref 80.0–100.0)
Monocytes Absolute: 0.7 10*3/uL (ref 0.1–1.0)
Monocytes Relative: 9 %
Neutro Abs: 5.6 10*3/uL (ref 1.7–7.7)
Neutrophils Relative %: 70 %
Platelets: 369 10*3/uL (ref 150–400)
RBC: 4.54 MIL/uL (ref 4.22–5.81)
RDW: 14.7 % (ref 11.5–15.5)
WBC: 8 10*3/uL (ref 4.0–10.5)
nRBC: 0 % (ref 0.0–0.2)

## 2021-11-03 LAB — COMPREHENSIVE METABOLIC PANEL
ALT: 42 U/L (ref 0–44)
AST: 24 U/L (ref 15–41)
Albumin: 2.7 g/dL — ABNORMAL LOW (ref 3.5–5.0)
Alkaline Phosphatase: 37 U/L — ABNORMAL LOW (ref 38–126)
Anion gap: 9 (ref 5–15)
BUN: 12 mg/dL (ref 8–23)
CO2: 25 mmol/L (ref 22–32)
Calcium: 9 mg/dL (ref 8.9–10.3)
Chloride: 105 mmol/L (ref 98–111)
Creatinine, Ser: 0.59 mg/dL — ABNORMAL LOW (ref 0.61–1.24)
GFR, Estimated: >60 mL/min (ref >60)
Glucose, Bld: 229 mg/dL — ABNORMAL HIGH (ref 70–99)
Potassium: 3.9 mmol/L (ref 3.5–5.1)
Sodium: 139 mmol/L (ref 135–145)
Total Bilirubin: 0.4 mg/dL (ref 0.3–1.2)
Total Protein: 6 g/dL — ABNORMAL LOW (ref 6.5–8.1)

## 2021-11-03 LAB — GLUCOSE, CAPILLARY
Glucose-Capillary: 121 mg/dL — ABNORMAL HIGH (ref 70–99)
Glucose-Capillary: 191 mg/dL — ABNORMAL HIGH (ref 70–99)
Glucose-Capillary: 234 mg/dL — ABNORMAL HIGH (ref 70–99)
Glucose-Capillary: 153 mg/dL — ABNORMAL HIGH (ref 70–99)

## 2021-11-03 LAB — MAGNESIUM: Magnesium: 1.7 mg/dL (ref 1.7–2.4)

## 2021-11-03 NOTE — Progress Notes (Signed)
PROGRESS NOTE  Joshua Donovan DZH:[id number] DOB: December 20, 1955 DOA: 08/03/2021 PCP: [facility] [place]   LOS: 104 days   Brief Narrative / Interim history: 66 year old with a history of ICH, DM 2, HTN, and HLD who presented to [facility] 7/24 with right hand numbness and weakness and was diagnosed with a small brainstem stroke.  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  He required intubation and was transferred to [facility] for an interventional radiology intervention.  He ultimately required tracheostomy and PEG tube placement, and has subsequently suffered a prolonged hospital stay.  Significant events: 7/24 presented to [facility], ventral medulla CVA 7/25 tx to [facility], treated with Cleviprex 7/26 cerebral angiogram with stent placement to right vertebro-basilar junction, failed extubation, MRI brain> mild extension of stroke, now involving bilateral medial medullary, patent basilar artery and R VBJ stent  7/28 bedside tracheostomy by PCCM and PEG tube placement by General Surgery 7/31 Bleeding around trach site, bright red blood.  Underwent bronchoscopy, Trach Removed, patient orally reintubated, stoma packed. 8/2 ENT took patient to OR for trach redo 8/9 transferred to medical floor on tracheostomy 8/12 vomiting after bolus feeding with worsening hypoxia 8/13 copious bleeding from the heparin injection site controlled with pressure dressing 8/14 bleeding has resolved.  Trach secretions blood-tinged. 8/15 insurance denied LTAC appeal.  CSW working on SNF. 8/28: peer to peer for LTAC looks like this was denied 8/29: V. tach runs - increased metoprolol  9/2: Slightly increased work of breathing-low-grade temps 99 for chest x-ray showed no interval changes 9/5:  Ancef started 2/2 secretions --no wbc or fever 9/12 completed abx course  Currently Awaiting placement  - [name]  Subjective / 24h Interval events: Alert, mouthing  words but unintelligible  Assesement and Plan: Principal Problem:   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[place]) Active Problems:   Acute respiratory failure with hypoxia ([place])   Hypertension   Uncontrolled type 2 diabetes mellitus with hyperglycemia (HCC)   Mixed hyperlipidemia   Alcohol abuse   NSVT (nonsustained ventricular tachycardia) (HCC)   Dysphagia   Tracheostomy in place ([place])   HAP (hospital-acquired pneumonia)   Principal problem 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  Continue supportive care.  Continue PT OT as tolerated, await SNF bed.  -PER MD [name] on 10/20/2021 duration of dual antiplatelet therapy is 6 months starting from 08/04/2021 thereafter aspirin alone.   Active problems Acute respiratory failure with hypoxia (HCC) along with aspiration pneumonia-Patient required ICU admission and mechanical ventilation after admission secondary to worsening mentation. Concern for possible aspiration pneumonitis. Patient unable to extubate. Tracheostomy placed on 7/31 and revised by ENT on 08/11/21 secondary to bleeding. Currently stable. He has finished antibiotic treatment for his infections.  Continue supportive care and trach hygiene per RT, his trach secretions continue to be persistently high, repeat trach aspiration and cultures done by PCCM on 10/25/2021,  scopolamine patch started on 10/26/2021. PRN lasix,  PCCM monitoring. -sputum culture showing few serratia/entero.staph-- will treat with short course of IV abx (started 10/22)   Alcohol abuse - Initially managed on CIWA.  No signs of DTs now, Continue thiamine, multivitamin and folic acid.   Mixed hyperlipidemia - Continue Lipitor.   Hypertension - Continue clonidine, amlodipine, hydrochlorothiazide and Coreg    NSVT (nonsustained  ventricular tachycardia) (HCC) EF 60% - Patient with intermittent Vtach. Most recently with 25 beats of vtach on 08/30/21. QTc of 424 msec, -Replete potassium and magnesium, Cardiology consult, maintain mag >2 and k >4, no additioonal cardiac w/u.  Continue beta-blocker.   Dysphagia - Patient is s/p PEG tube and is on tube feeds via PEG. Continue tube feeds and free water.   Tracheostomy complication (HCC)-resolved as of 08/25/2021 - In setting of DAPT. ENT consulted for revision. Bleeding resolved.   Hypokalemia and hypomagnesemia -stopped HCTZ which likely is worsening his hypokalemia, still on scheduled potassium supplementation monitor potassium levels closely.   Uncontrolled type 2 diabetes mellitus with hyperglycemia (HCC) - Most recent hemoglobin A1C of 8.3%. uncontrolled with hyper- and hypoglycemia.  Levemir along with NovoLog every 4 hours  CBG (last 3)  Recent Labs    11/02/21 2237 11/03/21 0537 11/03/21 1142  GLUCAP 222* 121* 191*    Scheduled Meds:  amLODipine  10 mg Per Tube Daily   aspirin  81 mg Per Tube Daily   atorvastatin  80 mg Per Tube Daily   carvedilol  37.5 mg Per Tube BID WC   diclofenac Sodium  2 g Topical QID   famotidine  20 mg Per Tube Daily   feeding supplement ([name])  237 mL Per Tube 6 X Daily   feeding supplement (PROSource TF20)  60 mL Per Tube Daily   folic acid  1 mg Per Tube Daily   free water  100 mL Per Tube Q4H   guaiFENesin  10 mL Per Tube Q4H   insulin aspart  0-9 Units Subcutaneous Q6H   insulin detemir  30 Units Subcutaneous BID   multivitamin with minerals  1 tablet Per Tube Daily   mouth rinse  15 mL Mouth Rinse 4 times per day   potassium chloride  40 mEq Per Tube BID   scopolamine  1 patch Transdermal Q72H   thiamine  100 mg Per Tube Daily   ticagrelor  90 mg Per Tube BID   Continuous Infusions:  ceFEPime (MAXIPIME) IV 2 g (11/03/21 1005)   PRN Meds:.[DISCONTINUED] acetaminophen **OR**  acetaminophen (TYLENOL) oral liquid 160 mg/5 mL **OR** [DISCONTINUED] acetaminophen, bisacodyl, hydrALAZINE, labetalol, liver oil-zinc oxide, mouth rinse, polyethylene glycol, sodium chloride  Current Outpatient Medications  Medication Instructions   ACCU-CHEK FASTCLIX LANCETS MISC Use as directed twice daily dia E11.65    atorvastatin (LIPITOR) 20 mg, Oral, Daily   atorvastatin (LIPITOR) 80 mg, Oral, Daily   Blood Glucose Monitoring Suppl (ACCU-CHEK AVIVA PLUS) w/Device KIT Use as directed   cetirizine (ZYRTEC) 10 mg, Oral, Daily   glucose blood (ACCU-CHEK AVIVA PLUS) test strip Use two times daily to check blood sugar   hydrochlorothiazide (MICROZIDE) 12.5 mg, Oral, Daily   lisinopril (ZESTRIL) 40 mg, Oral, Daily   metFORMIN (GLUCOPHAGE) 1,000 mg, Oral, 2 times daily with meals   metoprolol tartrate (LOPRESSOR) 25 mg, Oral, Daily    Diet Orders (From admission, onward)     Start     Ordered   10/20/21 0819  Diet NPO time specified  Diet effective now       Comments: All medications and diet via feeding tube   10/20/21 0818            DVT prophylaxis: Place TED hose Start: 08/22/21 1546 SCD's Start: 08/03/21 2311   Lab Results  Component Value Date   PLT 369 11/03/2021      Code Status: Full Code  Family Communication:  no family at bedside   Status is: Inpatient  Remains inpatient appropriate because: placement pending   Level of care: Med-Surg  Objective: Vitals:   11/03/21 0309 11/03/21 0839 11/03/21 0903 11/03/21 1143  BP:  (!) 144/93 (!) 144/93 131/86  Pulse: 82 83 82 85  Resp: (!) 31 (!) 25 (!) 28 19  Temp:  99 F (37.2 C)  98.9 F (37.2 C)  TempSrc:  Oral  Oral  SpO2: 97% 97% 98% 97%  Weight:      Height:        Intake/Output Summary (Last 24 hours) at 11/03/2021 1246 Last data filed at 11/03/2021 5379 Gross per 24 hour  Intake --  Output 800 ml  Net -800 ml   Wt Readings from Last 3 Encounters:  10/04/21 79.8 kg  08/02/21 83.9 kg   02/20/20 88.6 kg    Examination:  Constitutional: NAD Eyes: no scleral icterus ENMT: Mucous membranes are moist.  Neck: normal, supple Respiratory: clear to auscultation bilaterally, no wheezing, no crackles. Normal respiratory effort. Cardiovascular: Regular rate and rhythm, no murmurs / rubs / gallops.  Abdomen: non distended, no tenderness. Bowel sounds positive.  Musculoskeletal: no clubbing / cyanosis.  Skin: no rashes Neurologic: non focal   Data Reviewed: I have independently reviewed following labs and imaging studies   CBC Recent Labs  Lab 11/03/21 0726  WBC 8.0  HGB 12.3*  HCT 37.0*  PLT 369  MCV 81.5  MCH 27.1  MCHC 33.2  RDW 14.7  LYMPHSABS 1.1  MONOABS 0.7  EOSABS 0.5  BASOSABS 0.0    Recent Labs  Lab 11/01/21 0314 11/03/21 0726  NA 138 139  K 3.7 3.9  CL 105 105  CO2 23 25  GLUCOSE 116* 229*  BUN 12 12  CREATININE 0.51* 0.59*  CALCIUM 8.9 9.0  AST  --  24  ALT  --  42  ALKPHOS  --  37*  BILITOT  --  0.4  ALBUMIN  --  2.7*  MG  --  1.7    ------------------------------------------------------------------------------------------------------------------ No results for input(s): "CHOL", "HDL", "LDLCALC", "TRIG", "CHOLHDL", "LDLDIRECT" in the last 72 hours.  Lab Results  Component Value Date   HGBA1C 8.3 (H) 08/03/2021   ------------------------------------------------------------------------------------------------------------------ No results for input(s): "TSH", "T4TOTAL", "T3FREE", "THYROIDAB" in the last 72 hours.  Invalid input(s): "FREET3"  Cardiac Enzymes No results for input(s): "CKMB", "TROPONINI", "MYOGLOBIN" in the last 168 hours.  Invalid input(s): "CK" ------------------------------------------------------------------------------------------------------------------    Component Value Date/Time   BNP 15.9 10/26/2021 0817    CBG: Recent Labs  Lab 11/02/21 1855 11/02/21 1924 11/02/21 2237 11/03/21 0537  11/03/21 1142  GLUCAP 232* 271* 222* 121* 191*    Recent Results (from the past 240 hour(s))  Culture, Respiratory w Gram Stain     Status: None   Collection Time: 10/25/21  9:29 AM   Specimen: Tracheal Aspirate; Respiratory  Result Value Ref Range Status   Specimen Description TRACHEAL ASPIRATE  Final   Special Requests NONE  Final   Gram Stain   Final    FEW SQUAMOUS EPITHELIAL CELLS PRESENT FEW WBC PRESENT,BOTH PMN AND MONONUCLEAR MODERATE GRAM POSITIVE COCCI IN CLUSTERS MODERATE GRAM POSITIVE COCCI IN PAIRS AND CHAINS FEW GRAM NEGATIVE RODS Performed at [facility], [place] [address] [place],  [address]    Culture   Final    FEW ENTEROBACTER AEROGENES FEW SERRATIA MARCESCENS MODERATE STAPHYLOCOCCUS AUREUS    Report Status 10/30/2021 FINAL  Final   Organism ID,  Bacteria ENTEROBACTER AEROGENES  Final   Organism ID, Bacteria SERRATIA MARCESCENS  Final   Organism ID, Bacteria STAPHYLOCOCCUS AUREUS  Final      Susceptibility   Enterobacter aerogenes - MIC*    CEFAZOLIN >=64 RESISTANT Resistant     CEFEPIME 0.25 SENSITIVE Sensitive     CEFTAZIDIME >=64 RESISTANT Resistant     CEFTRIAXONE >=64 RESISTANT Resistant     CIPROFLOXACIN <=0.25 SENSITIVE Sensitive     GENTAMICIN <=1 SENSITIVE Sensitive     IMIPENEM 1 SENSITIVE Sensitive     TRIMETH/SULFA <=20 SENSITIVE Sensitive     PIP/TAZO >=128 RESISTANT Resistant     * FEW ENTEROBACTER AEROGENES   Staphylococcus aureus - MIC*    CIPROFLOXACIN >=8 RESISTANT Resistant     ERYTHROMYCIN >=8 RESISTANT Resistant     GENTAMICIN <=0.5 SENSITIVE Sensitive     OXACILLIN 0.5 SENSITIVE Sensitive     TETRACYCLINE <=1 SENSITIVE Sensitive     VANCOMYCIN 1 SENSITIVE Sensitive     TRIMETH/SULFA <=10 SENSITIVE Sensitive     CLINDAMYCIN <=0.25 SENSITIVE Sensitive     RIFAMPIN <=0.5 SENSITIVE Sensitive     Inducible Clindamycin NEGATIVE Sensitive     * MODERATE STAPHYLOCOCCUS AUREUS   Serratia marcescens - MIC*    CEFAZOLIN  >=64 RESISTANT Resistant     CEFEPIME <=0.12 SENSITIVE Sensitive     CEFTAZIDIME <=1 SENSITIVE Sensitive     CEFTRIAXONE <=0.25 SENSITIVE Sensitive     CIPROFLOXACIN <=0.25 SENSITIVE Sensitive     GENTAMICIN <=1 SENSITIVE Sensitive     TRIMETH/SULFA <=20 SENSITIVE Sensitive     * FEW SERRATIA MARCESCENS     Radiology Studies: No results found.   Marzetta Board, MD, PhD [facility]  Between 7 am - 7 pm I am available, please contact me via Amion (for emergencies) or Securechat (non urgent messages)  Between 7 pm - 7 am I am not available, please contact night coverage MD/APP via Amion

## 2021-11-03 NOTE — TOC Progression Note (Addendum)
Transition of Care [facility]) - Progression Note    Patient Details  Name: Joshua Donovan MRN: [id number] Date of Birth: 20-Sep-1955  Transition of Care [facility]) CM/SW [place], [place] Phone Number: 11/03/2021, 10:53 AM  Clinical Narrative:     CSW spoke with patient's daughter, Joelene Millin- informed [employer], [place] and [employer] unable to offer.  No reply from [facility] and [facility], MSW, LCSW Clinical Social Worker    Expected Discharge Plan: [place] Barriers to Discharge: SNF Pending bed offer  Expected Discharge Plan and Services Expected Discharge Plan: [place]   Discharge Planning Services: CM Consult Post Acute Care Choice: Long Term Acute Care (LTAC) Living arrangements for the past 2 months: Single Family Home                                       Social Determinants of Health (SDOH) Interventions    Readmission Risk Interventions     No data to display

## 2021-11-03 NOTE — Progress Notes (Signed)
Mobility Specialist Progress Note   11/03/21 1755  Mobility  Activity Turned to left side;Turned to right side;Turned to back - supine  Level of Assistance +2 (takes two people)  Assistive Device Other (Comment) (HHA)  Activity Response Tolerated well  $Mobility charge 1 Mobility   NT requesting assistance to adjust and reposition pt in bed. Left w/ needs met and NT still present.  Holland Falling Mobility Specialist Acute Rehab Office:  [phone]

## 2021-11-04 LAB — GLUCOSE, CAPILLARY
Glucose-Capillary: 119 mg/dL — ABNORMAL HIGH (ref 70–99)
Glucose-Capillary: 179 mg/dL — ABNORMAL HIGH (ref 70–99)
Glucose-Capillary: 211 mg/dL — ABNORMAL HIGH (ref 70–99)
Glucose-Capillary: 219 mg/dL — ABNORMAL HIGH (ref 70–99)
Glucose-Capillary: 276 mg/dL — ABNORMAL HIGH (ref 70–99)

## 2021-11-04 LAB — BASIC METABOLIC PANEL
Anion gap: 12 (ref 5–15)
BUN: 13 mg/dL (ref 8–23)
CO2: 23 mmol/L (ref 22–32)
Calcium: 9.3 mg/dL (ref 8.9–10.3)
Chloride: 107 mmol/L (ref 98–111)
Creatinine, Ser: 0.53 mg/dL — ABNORMAL LOW (ref 0.61–1.24)
GFR, Estimated: >60 mL/min (ref >60)
Glucose, Bld: 130 mg/dL — ABNORMAL HIGH (ref 70–99)
Potassium: 3.7 mmol/L (ref 3.5–5.1)
Sodium: 142 mmol/L (ref 135–145)

## 2021-11-04 NOTE — Progress Notes (Signed)
Physical Therapy Treatment Patient Details Name: Joshua Donovan MRN: [id number] DOB: 02-09-1955 Today's Date: 11/04/2021   History of Present Illness Pt is a 66 y.o. male who presented 08/02/21 with R foot pain and R-sided weakness. Transferred to [facility] 7/25. 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 S/p stent placement to the R vertebrobasilar junction 7/26, failed extubation. Trach and PEG placed 7/28. S/p bronchoscopic evaluation, oral reintubation and tracheostomy removal with stoma packing 7/31 due to trach site bleeding. S/p trach revision 8/2. PMH: DM, GERD, TBI with prior ICH, HLD, HTN    PT Comments    Pt will be slow to improve toward limited goals.  Emphasis on warm up, rolling, transition to sitting, work EOB x>10 min on sitting balance, cervical/spinal extension and upright posture, controlled sit to stand and transfer to the tilt in space with intentional positioning.    Recommendations for follow up therapy are one component of a multi-disciplinary discharge planning process, led by the attending physician.  Recommendations may be updated based on patient status, additional functional criteria and insurance authorization.  Follow Up Recommendations  Skilled nursing-short term rehab (<3 hours/day) Can patient physically be transported by private vehicle: No   Assistance Recommended at Discharge Frequent or constant Supervision/Assistance  Patient can return home with the following Assistance with cooking/housework;Two people to help with walking and/or transfers;Two people to help with  bathing/dressing/bathroom;Assistance with feeding;Direct supervision/assist for medications management;Direct supervision/assist for financial management;Assist for transportation;Help with stairs or ramp for entrance   Equipment Recommendations   (TBD)    Recommendations for Other Services       Precautions / Restrictions Precautions Precautions: Fall Precaution Comments: trach collar, PEG, SBP < 180,  PMV on during waking hours if O2 sats are good     Mobility  Bed Mobility Overal bed mobility: Needs Assistance Bed Mobility: Rolling, Sidelying to Sit Rolling: Total assist, +2 for physical assistance Sidelying to sit: Total assist, +2 for physical assistance            Transfers Overall transfer level: Needs assistance   Transfers: Bed to chair/wheelchair/BSC       Squat pivot transfers: Total assist, +2 physical assistance     General transfer comment: Use of tone to pivot to the tilt in space w/c with face to face assist and full LE blocking    Ambulation/Gait               General Gait Details: unable   Stairs             Wheelchair Mobility    Modified Rankin (Stroke Patients Only) Modified Rankin (Stroke Patients Only) Pre-Morbid Rankin Score: No symptoms Modified Rankin: Severe disability     Balance Overall balance assessment: Needs assistance Sitting-balance support: Feet supported Sitting balance-Leahy Scale: Zero Sitting balance - Comments: total assist, pt able to extend cervically with min then min guard assist, but initially 4/5 times cervical extension threw pt into whole body spasm into extension.  With control able to work on upright sitting safely     Standing balance-Leahy Scale: Zero Standing balance comment: standing x2 for pericare and positioning of pad  Cognition Arousal/Alertness: Awake/alert Behavior During Therapy: WFL for tasks assessed/performed Overall Cognitive Status:   (NT formally, but appears to answer questions appropriately)                                          Exercises Other Exercises Other Exercises: bil LE and bil UE PROM    General Comments General comments (skin integrity, edema, etc.): VSS, pt positioned with pillows/towel rolls in the tilt in space. until he looked and related he felt good and safe.      Pertinent Vitals/Pain Pain Assessment Pain Assessment: No/denies pain Pain Intervention(s): Monitored during session    Home Living                          Prior Function            PT Goals (current goals can now be found in the care plan section) Acute Rehab PT Goals Patient Stated Goal: agreeable to session PT Goal Formulation: With patient/family Time For Goal Achievement: 11/12/21 Potential to Achieve Goals: Fair Progress towards PT goals: Progressing toward goals    Frequency    Min 2X/week      PT Plan Current plan remains appropriate    Co-evaluation              AM-PAC PT "6 Clicks" Mobility   Outcome Measure  Help needed turning from your back to your side while in a flat bed without using bedrails?: Total Help needed moving from lying on your back to sitting on the side of a flat bed without using bedrails?: Total Help needed moving to and from a bed to a chair (including a wheelchair)?: Total Help needed standing up from a chair using your arms (e.g., wheelchair or bedside chair)?: Total Help needed to walk in hospital room?: Total Help needed climbing 3-5 steps with a railing? : Total 6 Click Score: 6    End of Session Equipment Utilized During Treatment: Oxygen Activity Tolerance: Patient tolerated treatment well Patient left: in chair;with call bell/phone within reach Nurse Communication: Need for lift equipment;Mobility status PT Visit Diagnosis: Muscle weakness (generalized) (M62.81);Other symptoms and signs involving the nervous system (R29.898);Other  abnormalities of gait and mobility (R26.89);Other (comment)     Time: 1093-2355 PT Time Calculation (min) (ACUTE ONLY): 51 min  Charges:  $Therapeutic Exercise: 8-22 mins $Therapeutic Activity: 8-22 mins $Neuromuscular Re-education: 8-22 mins                     11/04/2021  Ginger Carne., PT Acute Rehabilitation Services [phone]  (office)   Tessie Fass Disa Riedlinger 11/04/2021, 6:29 PM

## 2021-11-04 NOTE — Progress Notes (Signed)
PROGRESS NOTE  Joshua Donovan OLM:[id number] DOB: Jun 07, 1955 DOA: 08/03/2021 PCP: [facility] [place]   LOS: 86 days   Brief Narrative / Interim history: 66 year old with a history of ICH, DM 2, HTN, and HLD who presented to [facility] 7/24 with right hand numbness and weakness and was diagnosed with a small brainstem stroke.  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  He required intubation and was transferred to [facility] for an interventional radiology intervention.  He ultimately required tracheostomy and PEG tube placement, and has subsequently suffered a prolonged hospital stay.  Significant events: 7/24 presented to [facility], ventral medulla CVA 7/25 tx to [facility], treated with Cleviprex 7/26 cerebral angiogram with stent placement to right vertebro-basilar junction, failed extubation, MRI brain> mild extension of stroke, now involving bilateral medial medullary, patent basilar artery and R VBJ stent  7/28 bedside tracheostomy by PCCM and PEG tube placement by General Surgery 7/31 Bleeding around trach site, bright red blood.  Underwent bronchoscopy, Trach Removed, patient orally reintubated, stoma packed. 8/2 ENT took patient to OR for trach redo 8/9 transferred to medical floor on tracheostomy 8/12 vomiting after bolus feeding with worsening hypoxia 8/13 copious bleeding from the heparin injection site controlled with pressure dressing 8/14 bleeding has resolved.  Trach secretions blood-tinged. 8/15 insurance denied LTAC appeal.  CSW working on SNF. 8/28: peer to peer for LTAC looks like this was denied 8/29: V. 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[place]) Active Problems:   Acute respiratory failure with hypoxia (HCC)   Hypertension   Uncontrolled type 2 diabetes mellitus with hyperglycemia (HCC)   Mixed hyperlipidemia   Alcohol abuse   NSVT (nonsustained ventricular tachycardia) (HCC)   Dysphagia   Tracheostomy in place ([place])   HAP (hospital-acquired pneumonia)   Principal problem 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  Continue supportive care.  Continue PT OT as tolerated, await SNF bed.  -PER MD [name] on 10/20/2021 duration of dual antiplatelet therapy is 6 months starting from 08/04/2021 thereafter aspirin alone.   Active problems Acute respiratory failure with hypoxia (HCC) along with aspiration pneumonia-Patient required ICU admission and mechanical ventilation after admission secondary to worsening mentation. Concern for possible aspiration pneumonitis. Patient unable to extubate. Tracheostomy placed on 7/31 and revised by ENT on 08/11/21 secondary to bleeding. Currently stable. He has finished antibiotic treatment for his infections.  Continue supportive care and trach hygiene per RT, his trach secretions continue to be persistently high, repeat trach aspiration and cultures done by PCCM on 10/25/2021,  scopolamine patch started on 10/26/2021. PRN lasix,  PCCM monitoring. -sputum culture showing few serratia/entero.staph-- will treat with short course of IV abx (started 10/22)   Alcohol abuse - Initially managed on CIWA.  No signs of DTs now, Continue thiamine, multivitamin and folic acid.   Mixed hyperlipidemia - Continue Lipitor.   Hypertension - Continue clonidine, amlodipine, hydrochlorothiazide and Coreg   NSVT  (nonsustained ventricular tachycardia) (HCC)  EF 60% - Patient with intermittent Vtach. Most recently with 25 beats of vtach on 08/30/21. QTc of 424 msec, -Replete potassium and magnesium, Cardiology consult, maintain mag >2 and k >4, no additioonal cardiac w/u.  Continue beta-blocker.   Dysphagia - Patient is s/p PEG tube and is on tube feeds via PEG. Continue tube feeds and free water.   Tracheostomy complication (HCC)-resolved as of 08/25/2021 - In setting of DAPT. ENT consulted for revision. Bleeding resolved.   Hypokalemia and hypomagnesemia -stopped HCTZ which likely is worsening his hypokalemia, still on scheduled potassium supplementation monitor potassium levels closely.   Uncontrolled type 2 diabetes mellitus with hyperglycemia (HCC) - Most recent hemoglobin A1C of 8.3%. uncontrolled with hyper- and hypoglycemia.  Levemir along with NovoLog every 4 hours.  CBGs monitored as below  CBG (last 3)  Recent Labs    11/03/21 1953 11/03/21 2255 11/04/21 0514  GLUCAP 276* 153* 119*     Scheduled Meds:  amLODipine  10 mg Per Tube Daily   aspirin  81 mg Per Tube Daily   atorvastatin  80 mg Per Tube Daily   carvedilol  37.5 mg Per Tube BID WC   diclofenac Sodium  2 g Topical QID   famotidine  20 mg Per Tube Daily   feeding supplement ([name])  237 mL Per Tube 6 X Daily   feeding supplement (PROSource TF20)  60 mL Per Tube Daily   folic acid  1 mg Per Tube Daily   free water  100 mL Per Tube Q4H   guaiFENesin  10 mL Per Tube Q4H   insulin aspart  0-9 Units Subcutaneous Q6H   insulin detemir  30 Units Subcutaneous BID   multivitamin with minerals  1 tablet Per Tube Daily   mouth rinse  15 mL Mouth Rinse 4 times per day   potassium chloride  40 mEq Per Tube BID   scopolamine  1 patch Transdermal Q72H   thiamine  100 mg Per Tube Daily   ticagrelor  90 mg Per Tube BID   Continuous Infusions:  ceFEPime (MAXIPIME) IV 2 g (11/04/21 0833)   PRN Meds:.[DISCONTINUED]  acetaminophen **OR** acetaminophen (TYLENOL) oral liquid 160 mg/5 mL **OR** [DISCONTINUED] acetaminophen, bisacodyl, hydrALAZINE, labetalol, liver oil-zinc oxide, mouth rinse, polyethylene glycol, sodium chloride  Current Outpatient Medications  Medication Instructions   ACCU-CHEK FASTCLIX LANCETS MISC Use as directed twice daily dia E11.65    atorvastatin (LIPITOR) 20 mg, Oral, Daily   atorvastatin (LIPITOR) 80 mg, Oral, Daily   Blood Glucose Monitoring Suppl (ACCU-CHEK AVIVA PLUS) w/Device KIT Use as directed   cetirizine (ZYRTEC) 10 mg, Oral, Daily   glucose blood (ACCU-CHEK AVIVA PLUS) test strip Use two times daily to check blood sugar   hydrochlorothiazide (MICROZIDE) 12.5 mg, Oral, Daily   lisinopril (ZESTRIL) 40 mg, Oral, Daily   metFORMIN (GLUCOPHAGE) 1,000 mg, Oral, 2 times daily with meals   metoprolol tartrate (LOPRESSOR) 25 mg, Oral, Daily    Diet Orders (From admission, onward)     Start     Ordered   10/20/21 0819  Diet NPO time specified  Diet effective now       Comments: All medications and diet via feeding tube   10/20/21 0818            DVT prophylaxis: Place TED hose Start: 08/22/21 1546 SCD's Start: 08/03/21 2311   Lab Results  Component Value Date   PLT 369 11/03/2021      Code Status: Full Code  Family Communication: no family at bedside   Status is: Inpatient  Remains inpatient appropriate because: placement pending   Level of care: Med-Surg  Objective: Vitals:   11/03/21 2314 11/04/21 0024 11/04/21 0801 11/04/21 0851  BP: (!) 147/85  (!) 155/90 (!) 155/90  Pulse: 87 85 88 87  Resp: (!) 23 (!) 24 (!) 24 (!) 26  Temp: 98.8 F (37.1 C)  98.8 F (37.1 C)   TempSrc: Oral  Oral   SpO2: 97% 96% 96% 96%  Weight:      Height:        Intake/Output Summary (Last 24 hours) at 11/04/2021 1053 Last data filed at 11/04/2021 0543 Gross per 24 hour  Intake --  Output 1500 ml  Net -1500 ml    Wt Readings from Last 3 Encounters:   10/04/21 79.8 kg  08/02/21 83.9 kg  02/20/20 88.6 kg    Examination:  Constitutional: nad Respiratory: CTA Cardiovascular: RRR  Data Reviewed: I have independently reviewed following labs and imaging studies   CBC Recent Labs  Lab 11/03/21 0726  WBC 8.0  HGB 12.3*  HCT 37.0*  PLT 369  MCV 81.5  MCH 27.1  MCHC 33.2  RDW 14.7  LYMPHSABS 1.1  MONOABS 0.7  EOSABS 0.5  BASOSABS 0.0     Recent Labs  Lab 11/01/21 0314 11/03/21 0726 11/04/21 0430  NA 138 139 142  K 3.7 3.9 3.7  CL 105 105 107  CO2 _0 GLUCOSE 116* 229* 130*  BUN _1 CREATININE 0.51* 0.59* 0.53*  CALCIUM 8.9 9.0 9.3  AST  --  24  --   ALT  --  42  --   ALKPHOS  --  37*  --   BILITOT  --  0.4  --   ALBUMIN  --  2.7*  --   MG  --  1.7  --      ------------------------------------------------------------------------------------------------------------------ No results for input(s): "CHOL", "HDL", "LDLCALC", "TRIG", "CHOLHDL", "LDLDIRECT" in the last 72 hours.  Lab Results  Component Value Date   HGBA1C 8.3 (H) 08/03/2021   ------------------------------------------------------------------------------------------------------------------ No results for input(s): "TSH", "T4TOTAL", "T3FREE", "THYROIDAB" in the last 72 hours.  Invalid input(s): "FREET3"  Cardiac Enzymes No results for input(s): "CKMB", "TROPONINI", "MYOGLOBIN" in the last 168 hours.  Invalid input(s): "CK" ------------------------------------------------------------------------------------------------------------------    Component Value Date/Time   BNP 15.9 10/26/2021 0817    CBG: Recent Labs  Lab 11/03/21 1142 11/03/21 1756 11/03/21 1953 11/03/21 2255 11/04/21 0514  GLUCAP 191* 234* 276* 153* 119*     No results found for this or any previous visit (from the past 240 hour(s)).    Radiology Studies: No results found.   Marzetta Board, MD, PhD [facility]  Between 7 am - 7 pm I am  available, please contact me via Amion (for emergencies) or Securechat (non urgent messages)  Between 7 pm - 7 am I am not available, please contact night coverage MD/APP via Amion

## 2021-11-04 NOTE — Progress Notes (Signed)
Speech Language Pathology Treatment: Joshua Donovan Speaking valve  Patient Details Name: Joshua Donovan MRN: [id number] DOB: 08-20-1955 Today's Date: 11/04/2021 Time: 1000-1015 SLP Time Calculation (min) (ACUTE ONLY): 15 min  Assessment / Plan / Recommendation Clinical Impression  Pt visiting with ex wife. PMSV not in place. SLP placed PMSV and instructed her on placement. Joshua Donovan then able to communicate at word level if a visual cue given when voice was inadequate. Pt corrected in 100% of opportunities. Completed 5 reps on EMST at 10 cm H20 and 5 at 11 cm H20. Pt and SLP played a game in which Joshua Donovan had to say five words in the category of animals and say them clearly enough for SLP to comprehend. Pt successful in 3/5 and then revised the other 2 attempts with a cue. Continue to work on open ended speech intelligibility strategies.   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 He  presented 08/02/21 with R foot pain and R-sided weakness. Transferred to [facility] 7/25. 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 S/p stent placement to the R vertebrobasilar junction 7/26, failed extubation. Trach and PEG placed 7/28. PMH: DM, GERD, TBI with prior ICH, HLD, HTN      SLP Plan  Continue with current plan of care      Recommendations for follow up therapy are one component of a multi-disciplinary discharge planning process, led by the attending physician.  Recommendations may be updated based on patient status, additional functional criteria and insurance authorization.    Recommendations         Patient may use Passy-Muir Speech  Valve: During all waking hours (remove during sleep) PMSV Supervision: Intermittent MD: Please consider changing trach tube to : Smaller size         Plan: Continue with current plan of care           Dilan Fullenwider, [name]  11/04/2021, 11:38 AM

## 2021-11-04 NOTE — Plan of Care (Signed)
Problem: Education: Goal: Knowledge of General Education information will improve Description: Including pain rating scale, medication(s)/side effects and non-pharmacologic comfort measures Outcome: Progressing   Problem: Health Behavior/Discharge Planning: Goal: Ability to manage health-related needs will improve Outcome: Progressing   Problem: Clinical Measurements: Goal: Ability to maintain clinical measurements within normal limits will improve Outcome: Progressing Goal: Will remain free from infection Outcome: Progressing Goal: Diagnostic test results will improve Outcome: Progressing Goal: Respiratory complications will improve Outcome: Progressing Goal: Cardiovascular complication will be avoided Outcome: Progressing   Problem: Activity: Goal: Risk for activity intolerance will decrease Outcome: Progressing   Problem: Nutrition: Goal: Adequate nutrition will be maintained Outcome: Progressing   Problem: Coping: Goal: Level of anxiety will decrease Outcome: Progressing   Problem: Elimination: Goal: Will not experience complications related to bowel motility Outcome: Progressing Goal: Will not experience complications related to urinary retention Outcome: Progressing   Problem: Pain Managment: Goal: General experience of comfort will improve Outcome: Progressing   Problem: Safety: Goal: Ability to remain free from injury will improve Outcome: Progressing   Problem: Skin Integrity: Goal: Risk for impaired skin integrity will decrease Outcome: Progressing   Problem: Education: Goal: Ability to describe self-care measures that may prevent or decrease complications (Diabetes Survival Skills Education) will improve Outcome: Progressing Goal: Individualized Educational Video(s) Outcome: Progressing   Problem: Coping: Goal: Ability to adjust to condition or change in health will improve Outcome: Progressing   Problem: Fluid Volume: Goal: Ability to  maintain a balanced intake and output will improve Outcome: Progressing   Problem: Health Behavior/Discharge Planning: Goal: Ability to identify and utilize available resources and services will improve Outcome: Progressing Goal: Ability to manage health-related needs will improve Outcome: Progressing   Problem: Metabolic: Goal: Ability to maintain appropriate glucose levels will improve Outcome: Progressing   Problem: Nutritional: Goal: Maintenance of adequate nutrition will improve Outcome: Progressing Goal: Progress toward achieving an optimal weight will improve Outcome: Progressing   Problem: Skin Integrity: Goal: Risk for impaired skin integrity will decrease Outcome: Progressing   Problem: Tissue Perfusion: Goal: Adequacy of tissue perfusion will improve Outcome: Progressing   Problem: Education: Goal: Knowledge of disease or condition will improve Outcome: Progressing Goal: Knowledge of secondary prevention will improve (SELECT ALL) Outcome: Progressing Goal: Knowledge of patient specific risk factors will improve (INDIVIDUALIZE FOR PATIENT) Outcome: Progressing Goal: Individualized Educational Video(s) Outcome: Progressing   Problem: Coping: Goal: Will verbalize positive feelings about self Outcome: Progressing Goal: Will identify appropriate support needs Outcome: Progressing   Problem: Health Behavior/Discharge Planning: Goal: Ability to manage health-related needs will improve Outcome: Progressing   Problem: Self-Care: Goal: Ability to participate in self-care as condition permits will improve Outcome: Progressing Goal: Verbalization of feelings and concerns over difficulty with self-care will improve Outcome: Progressing Goal: Ability to communicate needs accurately will improve Outcome: Progressing   Problem: Nutrition: Goal: Risk of aspiration will decrease Outcome: Progressing Goal: Dietary intake will improve Outcome: Progressing    Problem: Ischemic Stroke/TIA Tissue Perfusion: Goal: Complications of ischemic stroke/TIA will be minimized Outcome: Progressing   Problem: Education: Goal: Understanding of CV disease, CV risk reduction, and recovery process will improve Outcome: Progressing Goal: Individualized Educational Video(s) Outcome: Progressing   Problem: Activity: Goal: Ability to return to baseline activity level will improve Outcome: Progressing   Problem: Cardiovascular: Goal: Ability to achieve and maintain adequate cardiovascular perfusion will improve Outcome: Progressing Goal: Vascular access site(s) Level 0-1 will be maintained Outcome: Progressing   Problem: Health Behavior/Discharge Planning: Goal: Ability to safely manage  health-related needs after discharge will improve Outcome: Progressing   Problem: Activity: Goal: Ability to tolerate increased activity will improve Outcome: Progressing

## 2021-11-05 LAB — GLUCOSE, CAPILLARY
Glucose-Capillary: 95 mg/dL (ref 70–99)
Glucose-Capillary: 215 mg/dL — ABNORMAL HIGH (ref 70–99)
Glucose-Capillary: 232 mg/dL — ABNORMAL HIGH (ref 70–99)

## 2021-11-05 NOTE — Progress Notes (Signed)
PROGRESS NOTE  Joshua Donovan PPJ:[id number] DOB: December 26, 1955 DOA: 08/03/2021 PCP: [facility] [place]   LOS: 37 days   Brief Narrative / Interim history: 66 year old with a history of ICH, DM 2, HTN, and HLD who presented to [facility] 7/24 with right hand numbness and weakness and was diagnosed with a small brainstem stroke.  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  He required intubation and was transferred to [facility] for an interventional radiology intervention.  He ultimately required tracheostomy and PEG tube placement, and has subsequently suffered a prolonged hospital stay.  Significant events: 7/24 presented to [facility], ventral medulla CVA 7/25 tx to [facility], treated with Cleviprex 7/26 cerebral angiogram with stent placement to right vertebro-basilar junction, failed extubation, MRI brain> mild extension of stroke, now involving bilateral medial medullary, patent basilar artery and R VBJ stent  7/28 bedside tracheostomy by PCCM and PEG tube placement by General Surgery 7/31 Bleeding around trach site, bright red blood.  Underwent bronchoscopy, Trach Removed, patient orally reintubated, stoma packed. 8/2 ENT took patient to OR for trach redo 8/9 transferred to medical floor on tracheostomy 8/12 vomiting after bolus feeding with worsening hypoxia 8/13 copious bleeding from the heparin injection site controlled with pressure dressing 8/14 bleeding has resolved.  Trach secretions blood-tinged. 8/15 insurance denied LTAC appeal.  CSW working on SNF. 8/28: peer to peer for LTAC looks like this was denied 8/29: V. 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[place]) Active Problems:   Acute respiratory failure with hypoxia ([place])   Hypertension   Uncontrolled type 2 diabetes mellitus with hyperglycemia (HCC)   Mixed hyperlipidemia   Alcohol abuse   NSVT (nonsustained ventricular tachycardia) (HCC)   Dysphagia   Tracheostomy in place ([place])   HAP (hospital-acquired pneumonia)   Principal problem 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  Continue supportive care.  Continue PT OT as tolerated, await SNF bed.  -PER MD [name] on 10/20/2021 duration of dual antiplatelet therapy is 6 months starting from 08/04/2021 thereafter aspirin alone.   Active problems Acute respiratory failure with hypoxia (HCC) along with aspiration pneumonia-Patient required ICU admission and mechanical ventilation after admission secondary to worsening mentation. Concern for possible aspiration pneumonitis. Patient unable to extubate. Tracheostomy placed on 7/31 and revised by ENT on 08/11/21 secondary to bleeding. Currently stable. He has finished antibiotic treatment for his infections.  Continue supportive care and trach hygiene per RT, his trach secretions continue to be persistently high, repeat trach aspiration and cultures done by PCCM on 10/25/2021,  scopolamine patch started on 10/26/2021. PRN lasix,  PCCM monitoring. -sputum culture showing few serratia/entero.staph-- will treat with short course of IV abx (started 10/22). End date for cefepime is tomorrow   Alcohol abuse - Initially managed on CIWA.  No signs of DTs now, Continue thiamine, multivitamin and folic acid.   Mixed hyperlipidemia - Continue Lipitor.   Hypertension - Continue clonidine, amlodipine,  hydrochlorothiazide and Coreg  NSVT (nonsustained ventricular tachycardia) (HCC) EF 60% - Patient with intermittent Vtach. Most recently with 25 beats of vtach on 08/30/21. QTc of 424 msec, -Replete potassium and magnesium, Cardiology consult, maintain mag >2 and k >4, no additioonal cardiac w/u.  Continue beta-blocker.   Dysphagia - Patient is s/p PEG tube and is on tube feeds via PEG. Continue tube feeds and free water.   Tracheostomy complication (HCC)-resolved as of 08/25/2021 - In setting of DAPT. ENT consulted for revision. Bleeding resolved.   Hypokalemia and hypomagnesemia -stopped HCTZ which likely is worsening his hypokalemia, still on scheduled potassium supplementation monitor potassium levels closely.   Uncontrolled type 2 diabetes mellitus with hyperglycemia (HCC) - Most recent hemoglobin A1C of 8.3%. uncontrolled with hyper- and hypoglycemia.  Levemir along with NovoLog every 4 hours.  CBGs monitored as below  CBG (last 3)  Recent Labs    11/04/21 2349 11/05/21 0459 11/05/21 1157  GLUCAP 211* 95 215*     Scheduled Meds:  amLODipine  10 mg Per Tube Daily   aspirin  81 mg Per Tube Daily   atorvastatin  80 mg Per Tube Daily   carvedilol  37.5 mg Per Tube BID WC   diclofenac Sodium  2 g Topical QID   famotidine  20 mg Per Tube Daily   feeding supplement ([name])  237 mL Per Tube 6 X Daily   feeding supplement (PROSource TF20)  60 mL Per Tube Daily   folic acid  1 mg Per Tube Daily   free water  100 mL Per Tube Q4H   guaiFENesin  10 mL Per Tube Q4H   insulin aspart  0-9 Units Subcutaneous Q6H   insulin detemir  30 Units Subcutaneous BID   multivitamin with minerals  1 tablet Per Tube Daily   mouth rinse  15 mL Mouth Rinse 4 times per day   potassium chloride  40 mEq Per Tube BID   scopolamine  1 patch Transdermal Q72H   thiamine  100 mg Per Tube Daily   ticagrelor  90 mg Per Tube BID   Continuous Infusions:  ceFEPime (MAXIPIME) IV 2 g (11/05/21 0926)    PRN Meds:.[DISCONTINUED] acetaminophen **OR** acetaminophen (TYLENOL) oral liquid 160 mg/5 mL **OR** [DISCONTINUED] acetaminophen, bisacodyl, hydrALAZINE, labetalol, liver oil-zinc oxide, mouth rinse, polyethylene glycol, sodium chloride  Current Outpatient Medications  Medication Instructions   ACCU-CHEK FASTCLIX LANCETS MISC Use as directed twice daily dia E11.65    atorvastatin (LIPITOR) 20 mg, Oral, Daily   atorvastatin (LIPITOR) 80 mg, Oral, Daily   Blood Glucose Monitoring Suppl (ACCU-CHEK AVIVA PLUS) w/Device KIT Use as directed   cetirizine (ZYRTEC) 10 mg, Oral, Daily   glucose blood (ACCU-CHEK AVIVA PLUS) test strip Use two times daily to check blood sugar   hydrochlorothiazide (MICROZIDE) 12.5 mg, Oral, Daily   lisinopril (ZESTRIL) 40 mg, Oral, Daily   metFORMIN (GLUCOPHAGE) 1,000 mg, Oral, 2 times daily with meals   metoprolol tartrate (LOPRESSOR) 25 mg, Oral, Daily    Diet Orders (From admission, onward)     Start     Ordered   10/20/21 0819  Diet NPO time specified  Diet effective now       Comments: All medications and diet via feeding tube   10/20/21 0818            DVT prophylaxis: Place TED hose Start: 08/22/21 1546 SCD's Start: 08/03/21 2311   Lab Results  Component Value Date   PLT 369 11/03/2021  Code Status: Full Code  Family Communication: no family at bedside   Status is: Inpatient  Remains inpatient appropriate because: placement pending   Level of care: Med-Surg  Objective: Vitals:   11/05/21 0307 11/05/21 0735 11/05/21 0910 11/05/21 1201  BP:  (!) 138/93 (!) 138/93 (!) 145/86  Pulse: 83 91 83 86  Resp: (!) 31 20 (!) 28 (!) 23  Temp:  98.4 F (36.9 C)  98.4 F (36.9 C)  TempSrc:  Oral  Oral  SpO2:  97% 96% 97%  Weight:      Height:        Intake/Output Summary (Last 24 hours) at 11/05/2021 1243 Last data filed at 11/04/2021 2346 Gross per 24 hour  Intake --  Output 1500 ml  Net -1500 ml    Wt Readings from Last 3  Encounters:  10/04/21 79.8 kg  08/02/21 83.9 kg  02/20/20 88.6 kg    Examination:  Constitutional: nad Respiratory: cta Cardiovascular: rrr  Data Reviewed: I have independently reviewed following labs and imaging studies   CBC Recent Labs  Lab 11/03/21 0726  WBC 8.0  HGB 12.3*  HCT 37.0*  PLT 369  MCV 81.5  MCH 27.1  MCHC 33.2  RDW 14.7  LYMPHSABS 1.1  MONOABS 0.7  EOSABS 0.5  BASOSABS 0.0     Recent Labs  Lab 11/01/21 0314 11/03/21 0726 11/04/21 0430  NA 138 139 142  K 3.7 3.9 3.7  CL 105 105 107  CO2 _0 GLUCOSE 116* 229* 130*  BUN _1 CREATININE 0.51* 0.59* 0.53*  CALCIUM 8.9 9.0 9.3  AST  --  24  --   ALT  --  42  --   ALKPHOS  --  37*  --   BILITOT  --  0.4  --   ALBUMIN  --  2.7*  --   MG  --  1.7  --      ------------------------------------------------------------------------------------------------------------------ No results for input(s): "CHOL", "HDL", "LDLCALC", "TRIG", "CHOLHDL", "LDLDIRECT" in the last 72 hours.  Lab Results  Component Value Date   HGBA1C 8.3 (H) 08/03/2021   ------------------------------------------------------------------------------------------------------------------ No results for input(s): "TSH", "T4TOTAL", "T3FREE", "THYROIDAB" in the last 72 hours.  Invalid input(s): "FREET3"  Cardiac Enzymes No results for input(s): "CKMB", "TROPONINI", "MYOGLOBIN" in the last 168 hours.  Invalid input(s): "CK" ------------------------------------------------------------------------------------------------------------------    Component Value Date/Time   BNP 15.9 10/26/2021 0817    CBG: Recent Labs  Lab 11/04/21 1121 11/04/21 1741 11/04/21 2349 11/05/21 0459 11/05/21 1157  GLUCAP 179* 219* 211* 95 215*     No results found for this or any previous visit (from the past 240 hour(s)).    Radiology Studies: No results found.   Marzetta Board, MD, PhD [facility]  Between 7 am - 7  pm I am available, please contact me via Amion (for emergencies) or Securechat (non urgent messages)  Between 7 pm - 7 am I am not available, please contact night coverage MD/APP via Amion

## 2021-11-05 NOTE — Progress Notes (Signed)
Occupational Therapy Treatment Patient Details Name: Joshua Donovan MRN: [id number] DOB: 1955/09/19 Today's Date: 11/05/2021   History of present illness Pt is a 66 y.o. male who presented 08/02/21 with R foot pain and R-sided weakness. Transferred to [facility] 7/25. 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 S/p stent placement to the R vertebrobasilar junction 7/26, failed extubation. Trach and PEG placed 7/28. S/p bronchoscopic evaluation, oral reintubation and tracheostomy removal with stoma packing 7/31 due to trach site bleeding. S/p trach revision 8/2. PMH: DM, GERD, TBI with prior ICH, HLD, HTN   OT comments  Patient received in supine and assisted to EOB with total assist of 2. Sitting balance/tolerance performed with patient tolerating ~10 minutes of sitting on EOB with instructions for head alignment and trunk control. Patient was total assist of 2 to squat pivot transfer to tilt in space and positioned for comfort and safety. Patient to continue to be followed by acute OT.    Recommendations for follow up therapy are one component of a multi-disciplinary discharge planning process, led by the attending physician.  Recommendations may be updated based on patient status, additional functional criteria and insurance authorization.    Follow Up Recommendations  Skilled nursing-short term rehab (<3 hours/day)    Assistance Recommended at Discharge Frequent or constant Supervision/Assistance  Patient can return home with the following  Two people to help with walking and/or transfers;Two people to help with  bathing/dressing/bathroom;Assistance with cooking/housework;Assistance with feeding;Direct supervision/assist for medications management;Direct supervision/assist for financial management;Assist for transportation;Help with stairs or ramp for entrance   Equipment Recommendations  Other (comment) (TBD, defer to next venue)    Recommendations for Other Services      Precautions / Restrictions Precautions Precautions: Fall Precaution Comments: trach collar, PEG, SBP < 180,  PMV on during waking hours if O2 sats are good Restrictions Weight Bearing Restrictions: No       Mobility Bed Mobility Overal bed mobility: Needs Assistance Bed Mobility: Rolling, Sidelying to Sit Rolling: Total assist, +2 for physical assistance Sidelying to sit: Total assist, +2 for physical assistance       General bed mobility comments: Patient unable to assist, bed pad used to assist    Transfers Overall transfer level: Needs assistance Equipment used: None Transfers: Bed to chair/wheelchair/BSC     Squat pivot transfers: Total assist, +2 physical assistance       General transfer comment: Extension tone during transfer aide with transfer     Balance Overall balance assessment: Needs assistance Sitting-balance support: Feet supported Sitting balance-Leahy Scale: Zero Sitting balance - Comments: cues on head alignment and trunk strengthening while seated on EOB Postural control: Left lateral lean, Posterior lean   Standing balance-Leahy Scale: Zero Standing balance comment: partial stand during transfer                           ADL either performed or assessed with clinical judgement   ADL Overall ADL's : Needs assistance/impaired     Grooming: Wash/dry face;Total assistance  General ADL Comments: focused on EOB sitting balance and a transfer to tilt in space. Patient requied total assist to wipe face    Extremity/Trunk  Assessment Upper Extremity Assessment RUE Deficits / Details: unable to obtain full PROM of hand and wirst, pt grimancing with pain. Increased edema. Noted shoulder flexion with cervical extension RUE Sensation: WNL RUE Coordination: decreased fine motor;decreased gross motor LUE Deficits / Details: unable to obtain full PROM of hand and wirst, pt grimancing with pain. Increased edema. Noted shoulder flexion with cervical extension LUE Sensation: WNL LUE Coordination: decreased fine motor;decreased gross motor            Vision       Perception     Praxis      Cognition Arousal/Alertness: Awake/alert Behavior During Therapy: WFL for tasks assessed/performed Overall Cognitive Status: Difficult to assess                   Orientation Level: Person, Place, Time Current Attention Level: Sustained Memory: Decreased short-term memory Following Commands: Follows multi-step commands consistently     Problem Solving: Slow processing General Comments: PMSV in place with patient having difficulty recalling names of friends visiting        Exercises      Shoulder Instructions       General Comments VSS    Pertinent Vitals/ Pain       Pain Assessment Pain Assessment: Faces Faces Pain Scale: No hurt Pain Intervention(s): Monitored during session  Home Living                                          Prior Functioning/Environment              Frequency  Min 2X/week        Progress Toward Goals  OT Goals(current goals can now be found in the care plan section)  Progress towards OT goals: Progressing toward goals  Acute Rehab OT Goals Patient Stated Goal: none stated OT Goal Formulation: With patient Time For Goal Achievement: 11/16/21 Potential to Achieve Goals: Fair ADL Goals Pt/caregiver will Perform Home Exercise Program: Both right and left upper extremity;With written HEP provided;Increased ROM Additional ADL Goal #1: Pt  will return demonstrate proper positioning to avoid pressure sores and decrease edema. Additional ADL Goal #2: Pt will use sip and puff call button adapater with min assist demonstrating slight active cervical rotation in order to activate call light switch. Additional ADL Goal #3: Pt will maintain neutral cervical extension at midline for 1 minute while sitting EOB, demonstrating increased cervical strength needed for use of AE for communication.  Plan Discharge plan remains appropriate    Co-evaluation    PT/OT/SLP Co-Evaluation/Treatment: Yes Reason for Co-Treatment: Complexity of the patient's impairments (multi-system involvement);For patient/therapist safety;To address functional/ADL transfers   OT goals addressed during session: Strengthening/ROM      AM-PAC OT "6 Clicks" Daily Activity     Outcome Measure   Help from another person eating meals?: Total Help from another person taking care of personal grooming?: Total Help from another person toileting, which includes using toliet, bedpan, or urinal?: Total Help from another person bathing (including washing, rinsing, drying)?: Total Help from another person to put on and taking off regular upper body clothing?: Total Help from another person to put on and taking off regular lower body clothing?: Total 6 Click Score: 6  End of Session Equipment Utilized During Treatment: Oxygen;Other (comment) (tilt in space wheelchair)  OT Visit Diagnosis: Other symptoms and signs involving the nervous system (R29.898);Muscle weakness (generalized) (M62.81);Feeding difficulties (R63.3);Other abnormalities of gait and mobility (R26.89);Unsteadiness on feet (R26.81);Other (comment)   Activity Tolerance Patient tolerated treatment well   Patient Left in chair;with call bell/phone within reach;with chair alarm set;with family/visitor present   Nurse Communication Need for lift equipment        Time: 3094-0768 OT Time Calculation (min):  31 min  Charges: OT General Charges $OT Visit: 1 Visit OT Treatments $Therapeutic Activity: 8-22 mins  Lodema Hong, [place]  Office [place] 11/05/2021, 1:14 PM

## 2021-11-05 NOTE — Progress Notes (Signed)
Physical Therapy Treatment Patient Details Name: Joshua Donovan MRN: [id number] DOB: [birth year] Today's Date: 11/05/2021   History of Present Illness Pt is a 66 y.o. male who presented 08/02/21 with R foot pain and R-sided weakness. Transferred to [facility] 7/25. 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 S/p stent placement to the R vertebrobasilar junction 7/26, failed extubation. Trach and PEG placed 7/28. S/p bronchoscopic evaluation, oral reintubation and tracheostomy removal with stoma packing 7/31 due to trach site bleeding. S/p trach revision 8/2. PMH: DM, GERD, TBI with prior ICH, HLD, HTN    PT Comments    Expecting slow progress, pt very agreeable to participation and getting OOB.  Emphasis on transitions to EOB, sitting balance/truncal activation and control with squat pivot transfer to the tilt in space w/c, well positioned for continued time up.    Recommendations for follow up therapy are one component of a multi-disciplinary discharge planning process, led by the attending physician.  Recommendations may be updated based on patient status, additional functional criteria and insurance authorization.  Follow Up Recommendations  Skilled nursing-short term rehab (<3 hours/day) Can patient physically be transported by private vehicle: No   Assistance Recommended at Discharge Frequent or constant Supervision/Assistance  Patient can return home with the following Assistance with cooking/housework;Two people to help with walking and/or transfers;Two people to help with bathing/dressing/bathroom;Assistance with feeding;Direct  supervision/assist for medications management;Direct supervision/assist for financial management;Assist for transportation;Help with stairs or ramp for entrance   Equipment Recommendations  Other (comment)    Recommendations for Other Services       Precautions / Restrictions Precautions Precautions: Fall Precaution Comments: trach collar, PEG, SBP < 180,  PMV on during waking hours if O2 sats are good Restrictions Weight Bearing Restrictions: No     Mobility  Bed Mobility Overal bed mobility: Needs Assistance Bed Mobility: Rolling, Sidelying to Sit Rolling: Total assist, +2 for physical assistance Sidelying to sit: Total assist, +2 for physical assistance       General bed mobility comments: Patient unable to assist, bed pad used to assist    Transfers Overall transfer level: Needs assistance Equipment used: None Transfers: Bed to chair/wheelchair/BSC       Squat pivot transfers: Total assist, +2 physical assistance     General transfer comment: Extension tone during transfer aided with transfer    Ambulation/Gait                   Stairs             Wheelchair Mobility    Modified Rankin (Stroke Patients Only)       Balance Overall balance assessment: Needs assistance Sitting-balance support: Feet supported Sitting balance-Leahy Scale: Zero Sitting balance - Comments: cues on head alignment and trunk strengthening while seated on EOB.  Worked on upright positioning and controlled extension and truncal activation without eliciting extensor tone. Postural control: Left lateral lean, Posterior lean   Standing balance-Leahy Scale: Zero Standing balance comment: partial stand during transfer                            Cognition Arousal/Alertness: Awake/alert Behavior During Therapy:  WFL for tasks assessed/performed Overall Cognitive Status: Difficult to assess                   Orientation Level: Person, Place,  Time Current Attention Level: Sustained Memory: Decreased short-term memory Following Commands: Follows multi-step commands consistently     Problem Solving: Slow processing General Comments: PMSV in place with patient having difficulty recalling names of friends visiting        Exercises Other Exercises Other Exercises: bil LE and bil UE PROM Other Exercises: PROM lateral cervical flexion and rotation in supine    General Comments General comments (skin integrity, edema, etc.): VSS      Pertinent Vitals/Pain Pain Assessment Pain Assessment: Faces Faces Pain Scale: No hurt Pain Intervention(s): Monitored during session    Home Living                          Prior Function            PT Goals (current goals can now be found in the care plan section) Acute Rehab PT Goals PT Goal Formulation: With patient/family Time For Goal Achievement: 11/12/21 Potential to Achieve Goals: Fair Progress towards PT goals: Progressing toward goals    Frequency    Min 2X/week      PT Plan Current plan remains appropriate    Co-evaluation PT/OT/SLP Co-Evaluation/Treatment: Yes Reason for Co-Treatment: Complexity of the patient's impairments (multi-system involvement) PT goals addressed during session: Mobility/safety with mobility;Strengthening/ROM OT goals addressed during session: Strengthening/ROM      AM-PAC PT "6 Clicks" Mobility   Outcome Measure  Help needed turning from your back to your side while in a flat bed without using bedrails?: Total Help needed moving from lying on your back to sitting on the side of a flat bed without using bedrails?: Total Help needed moving to and from a bed to a chair (including a wheelchair)?: Total Help needed standing up from a chair using your arms (e.g., wheelchair or bedside chair)?: Total Help needed to walk in hospital room?: Total Help needed climbing 3-5 steps with a railing? : Total 6 Click Score: 6    End of  Session         PT Visit Diagnosis: Muscle weakness (generalized) (M62.81);Other symptoms and signs involving the nervous system (R29.898);Other abnormalities of gait and mobility (R26.89);Other (comment) Hemiplegia - Right/Left: Right Hemiplegia - dominant/non-dominant: Dominant Hemiplegia - caused by: Cerebral infarction     Time: 0354-6568 PT Time Calculation (min) (ACUTE ONLY): 31 min  Charges:  $Therapeutic Activity: 8-22 mins                     11/05/2021  Ginger Carne., PT Acute Rehabilitation Services [phone]  (office)   Tessie Fass Awais Cobarrubias 11/05/2021, 1:31 PM

## 2021-11-05 NOTE — TOC Progression Note (Signed)
Transition of Care [place]) - Progression Note    Patient Details  Name: Kimari Coudriet MRN: [id number] Date of Birth: 1955/10/18  Transition of Care [facility]) CM/SW Contact  Joanne Chars, LCSW Phone Number: 11/05/2021, 11:09 AM  Clinical Narrative:  Bed offer noted from [facility] in [place].  CSW spoke with Merrilyn Puma at [facility], she reviewed (initial offer was made by corporate office), she can accept pt, aware of trach, needs to know what the plan would be after rehab.  CSW spoke with pt daughter Joelene Millin, informed her of offer.  She would need to talk with other siblings due to the distance to [place].  Plan for after rehab is to private pay for LTC.  She had questions as to what those costs would be.  CSW called Merrilyn Puma back and asked her to reach out to [place].       Expected Discharge Plan: Skilled Nursing Facility Barriers to Discharge: SNF Pending bed offer  Expected Discharge Plan and Services Expected Discharge Plan: [place]   Discharge Planning Services: CM Consult Post Acute Care Choice: Long Term Acute Care (LTAC) Living arrangements for the past 2 months: Single Family Home                                       Social Determinants of Health (SDOH) Interventions    Readmission Risk Interventions     No data to display

## 2021-11-06 LAB — GLUCOSE, CAPILLARY
Glucose-Capillary: 183 mg/dL — ABNORMAL HIGH (ref 70–99)
Glucose-Capillary: 206 mg/dL — ABNORMAL HIGH (ref 70–99)
Glucose-Capillary: 210 mg/dL — ABNORMAL HIGH (ref 70–99)
Glucose-Capillary: 236 mg/dL — ABNORMAL HIGH (ref 70–99)
Glucose-Capillary: 94 mg/dL (ref 70–99)

## 2021-11-06 NOTE — Progress Notes (Signed)
PROGRESS NOTE  Joshua Donovan WJX:[id number] DOB: 08/09/1955 DOA: 08/03/2021 PCP: [facility] [place]   LOS: 95 days   Brief Narrative / Interim history: 66 year old with a history of ICH, DM 2, HTN, and HLD who presented to [facility] 7/24 with right hand numbness and weakness and was diagnosed with a small brainstem stroke.  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  He required intubation and was transferred to [facility] for an interventional radiology intervention.  He ultimately required tracheostomy and PEG tube placement, and has subsequently suffered a prolonged hospital stay.  Significant events: 7/24 presented to [facility], ventral medulla CVA 7/25 tx to [facility], treated with Cleviprex 7/26 cerebral angiogram with stent placement to right vertebro-basilar junction, failed extubation, MRI brain> mild extension of stroke, now involving bilateral medial medullary, patent basilar artery and R VBJ stent  7/28 bedside tracheostomy by PCCM and PEG tube placement by General Surgery 7/31 Bleeding around trach site, bright red blood.  Underwent bronchoscopy, Trach Removed, patient orally reintubated, stoma packed. 8/2 ENT took patient to OR for trach redo 8/9 transferred to medical floor on tracheostomy 8/12 vomiting after bolus feeding with worsening hypoxia 8/13 copious bleeding from the heparin injection site controlled with pressure dressing 8/14 bleeding has resolved.  Trach secretions blood-tinged. 8/15 insurance denied LTAC appeal.  CSW working on SNF. 8/28: peer to peer for LTAC looks like this was denied 8/29: V. tach runs - increased metoprolol  9/2: Slightly increased work of [place] 99 for chest x-ray showed no interval changes 9/5:  Ancef started 2/2 secretions --no wbc or fever 9/12 completed abx course  Currently Awaiting placement  - [name]  Subjective / 24h Interval events: His eyes are open,  appears comfortable.  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  Continue supportive care.  Continue PT OT as tolerated, await SNF bed.  -PER MD [name] on 10/20/2021 duration of dual antiplatelet therapy is 6 months starting from 08/04/2021 thereafter aspirin alone.   Active problems Acute respiratory failure with hypoxia (HCC) along with aspiration pneumonia-Patient required ICU admission and mechanical ventilation after admission secondary to worsening mentation. Concern for possible aspiration pneumonitis. Patient unable to extubate. Tracheostomy placed on 7/31 and revised by ENT on 08/11/21 secondary to bleeding. Currently stable. He has finished antibiotic treatment for his infections.  Continue supportive care and trach hygiene per RT, his trach secretions continue to be persistently high, repeat trach aspiration and cultures done by PCCM on 10/25/2021,  scopolamine patch started on 10/26/2021. PRN lasix,  PCCM monitoring. -sputum culture showing few serratia/entero.staph-- will treat with short course of IV abx (started 10/22).  Finish cefepime today   Alcohol abuse - Initially managed on CIWA.  No signs of DTs now, Continue thiamine, multivitamin and folic acid.   Mixed hyperlipidemia - Continue Lipitor.   Hypertension - Continue clonidine, amlodipine,  hydrochlorothiazide and  Coreg   NSVT (nonsustained ventricular tachycardia) (HCC) EF 60% - Patient with intermittent Vtach. Most recently with 25 beats of vtach on 08/30/21. QTc of 424 msec, -Replete potassium and magnesium, Cardiology consult, maintain mag >2 and k >4, no additioonal cardiac w/u.  Continue beta-blocker.   Dysphagia - Patient is s/p PEG tube and is on tube feeds via PEG. Continue tube feeds and free water.   Tracheostomy complication (HCC)-resolved as of 08/25/2021 - In setting of DAPT. ENT consulted for revision. Bleeding resolved.   Hypokalemia and hypomagnesemia -stopped HCTZ which likely is worsening his hypokalemia, still on scheduled potassium supplementation monitor potassium levels closely.  Recheck tomorrow morning   Uncontrolled type 2 diabetes mellitus with hyperglycemia (HCC) - Most recent hemoglobin A1C of 8.3%. uncontrolled with hyper- and hypoglycemia.  Levemir along with NovoLog every 4 hours.  CBGs monitored as below  CBG (last 3)  Recent Labs    11/05/21 1835 11/06/21 0009 11/06/21 0506  GLUCAP 232* 206* 94     Scheduled Meds:  amLODipine  10 mg Per Tube Daily   aspirin  81 mg Per Tube Daily   atorvastatin  80 mg Per Tube Daily   carvedilol  37.5 mg Per Tube BID WC   diclofenac Sodium  2 g Topical QID   famotidine  20 mg Per Tube Daily   feeding supplement ([name])  237 mL Per Tube 6 X Daily   feeding supplement (PROSource TF20)  60 mL Per Tube Daily   folic acid  1 mg Per Tube Daily   free water  100 mL Per Tube Q4H   guaiFENesin  10 mL Per Tube Q4H   insulin aspart  0-9 Units Subcutaneous Q6H   insulin detemir  30 Units Subcutaneous BID   multivitamin with minerals  1 tablet Per Tube Daily   mouth rinse  15 mL Mouth Rinse 4 times per day   potassium chloride  40 mEq Per Tube BID   scopolamine  1 patch Transdermal Q72H   thiamine  100 mg Per Tube Daily   ticagrelor  90 mg Per Tube BID   Continuous Infusions:  ceFEPime  (MAXIPIME) IV 2 g (11/06/21 0104)   PRN Meds:.[DISCONTINUED] acetaminophen **OR** acetaminophen (TYLENOL) oral liquid 160 mg/5 mL **OR** [DISCONTINUED] acetaminophen, bisacodyl, hydrALAZINE, labetalol, liver oil-zinc oxide, mouth rinse, polyethylene glycol, sodium chloride  Current Outpatient Medications  Medication Instructions   ACCU-CHEK FASTCLIX LANCETS MISC Use as directed twice daily dia E11.65    atorvastatin (LIPITOR) 20 mg, Oral, Daily   atorvastatin (LIPITOR) 80 mg, Oral, Daily   Blood Glucose Monitoring Suppl (ACCU-CHEK AVIVA PLUS) w/Device KIT Use as directed   cetirizine (ZYRTEC) 10 mg, Oral, Daily   glucose blood (ACCU-CHEK AVIVA PLUS) test strip Use two times daily to check blood sugar   hydrochlorothiazide (MICROZIDE) 12.5 mg, Oral, Daily   lisinopril (ZESTRIL) 40 mg, Oral, Daily   metFORMIN (GLUCOPHAGE) 1,000 mg, Oral, 2 times daily with meals   metoprolol tartrate (LOPRESSOR) 25 mg, Oral, Daily    Diet Orders (From admission, onward)     Start     Ordered   10/20/21 0819  Diet NPO time specified  Diet effective now       Comments: All medications and diet via feeding tube   10/20/21 0818            DVT prophylaxis: Place TED hose Start: 08/22/21 1546 SCD's Start: 08/03/21 2311   Lab Results  Component Value Date  PLT 369 11/03/2021      Code Status: Full Code  Family Communication: no family at bedside   Status is: Inpatient  Remains inpatient appropriate because: placement pending   Level of care: Med-Surg  Objective: Vitals:   11/06/21 0228 11/06/21 0305 11/06/21 0742 11/06/21 0746  BP:  122/79  (!) 143/86  Pulse: 84 83 89 90  Resp: (!) 29 (!) 24 (!) 24 (!) 22  Temp:  98.8 F (37.1 C)  98 F (36.7 C)  TempSrc:  Oral  Oral  SpO2:  96% 96% 96%  Weight:      Height:        Intake/Output Summary (Last 24 hours) at 11/06/2021 1017 Last data filed at 11/06/2021 0320 Gross per 24 hour  Intake --  Output 2400 ml  Net -2400 ml     Wt Readings from Last 3 Encounters:  10/04/21 79.8 kg  08/02/21 83.9 kg  02/20/20 88.6 kg    Examination:  Constitutional: NAD Respiratory: CTA Cardiovascular: RRR  Data Reviewed: I have independently reviewed following labs and imaging studies   CBC Recent Labs  Lab 11/03/21 0726  WBC 8.0  HGB 12.3*  HCT 37.0*  PLT 369  MCV 81.5  MCH 27.1  MCHC 33.2  RDW 14.7  LYMPHSABS 1.1  MONOABS 0.7  EOSABS 0.5  BASOSABS 0.0     Recent Labs  Lab 11/01/21 0314 11/03/21 0726 11/04/21 0430  NA 138 139 142  K 3.7 3.9 3.7  CL 105 105 107  CO2 _0 GLUCOSE 116* 229* 130*  BUN _1 CREATININE 0.51* 0.59* 0.53*  CALCIUM 8.9 9.0 9.3  AST  --  24  --   ALT  --  42  --   ALKPHOS  --  37*  --   BILITOT  --  0.4  --   ALBUMIN  --  2.7*  --   MG  --  1.7  --      ------------------------------------------------------------------------------------------------------------------ No results for input(s): "CHOL", "HDL", "LDLCALC", "TRIG", "CHOLHDL", "LDLDIRECT" in the last 72 hours.  Lab Results  Component Value Date   HGBA1C 8.3 (H) 08/03/2021   ------------------------------------------------------------------------------------------------------------------ No results for input(s): "TSH", "T4TOTAL", "T3FREE", "THYROIDAB" in the last 72 hours.  Invalid input(s): "FREET3"  Cardiac Enzymes No results for input(s): "CKMB", "TROPONINI", "MYOGLOBIN" in the last 168 hours.  Invalid input(s): "CK" ------------------------------------------------------------------------------------------------------------------    Component Value Date/Time   BNP 15.9 10/26/2021 0817    CBG: Recent Labs  Lab 11/05/21 0459 11/05/21 1157 11/05/21 1835 11/06/21 0009 11/06/21 0506  GLUCAP 95 215* 232* 206* 94     No results found for this or any previous visit (from the past 240 hour(s)).    Radiology Studies: No results found.   Marzetta Board, MD, PhD [facility]  Between 7 am - 7 pm I am available, please contact me via Amion (for emergencies) or Securechat (non urgent messages)  Between 7 pm - 7 am I am not available, please contact night coverage MD/APP via Amion

## 2021-11-07 LAB — COMPREHENSIVE METABOLIC PANEL
ALT: 50 U/L — ABNORMAL HIGH (ref 0–44)
AST: 25 U/L (ref 15–41)
Albumin: 2.9 g/dL — ABNORMAL LOW (ref 3.5–5.0)
Alkaline Phosphatase: 36 U/L — ABNORMAL LOW (ref 38–126)
Anion gap: 9 (ref 5–15)
BUN: 11 mg/dL (ref 8–23)
CO2: 25 mmol/L (ref 22–32)
Calcium: 9.1 mg/dL (ref 8.9–10.3)
Chloride: 104 mmol/L (ref 98–111)
Creatinine, Ser: 0.45 mg/dL — ABNORMAL LOW (ref 0.61–1.24)
GFR, Estimated: >60 mL/min (ref >60)
Glucose, Bld: 129 mg/dL — ABNORMAL HIGH (ref 70–99)
Potassium: 3.9 mmol/L (ref 3.5–5.1)
Sodium: 138 mmol/L (ref 135–145)
Total Bilirubin: 0.5 mg/dL (ref 0.3–1.2)
Total Protein: 6.2 g/dL — ABNORMAL LOW (ref 6.5–8.1)

## 2021-11-07 LAB — GLUCOSE, CAPILLARY
Glucose-Capillary: 109 mg/dL — ABNORMAL HIGH (ref 70–99)
Glucose-Capillary: 196 mg/dL — ABNORMAL HIGH (ref 70–99)
Glucose-Capillary: 217 mg/dL — ABNORMAL HIGH (ref 70–99)
Glucose-Capillary: 221 mg/dL — ABNORMAL HIGH (ref 70–99)
Glucose-Capillary: 253 mg/dL — ABNORMAL HIGH (ref 70–99)

## 2021-11-07 LAB — CBC
HCT: 38.2 % — ABNORMAL LOW (ref 39.0–52.0)
Hemoglobin: 12.8 g/dL — ABNORMAL LOW (ref 13.0–17.0)
MCH: 27.4 pg (ref 26.0–34.0)
MCHC: 33.5 g/dL (ref 30.0–36.0)
MCV: 81.6 fL (ref 80.0–100.0)
Platelets: 330 10*3/uL (ref 150–400)
RBC: 4.68 MIL/uL (ref 4.22–5.81)
RDW: 15 % (ref 11.5–15.5)
WBC: 8.1 10*3/uL (ref 4.0–10.5)
nRBC: 0 % (ref 0.0–0.2)

## 2021-11-07 LAB — MAGNESIUM: Magnesium: 1.6 mg/dL — ABNORMAL LOW (ref 1.7–2.4)

## 2021-11-07 MED ORDER — MAGNESIUM SULFATE 2 GM/50ML IV SOLN
2.0000 g | Freq: Once | INTRAVENOUS | Status: AC
  Administered 2021-11-07: 2 g via INTRAVENOUS
  Filled 2021-11-07: qty 50

## 2021-11-07 NOTE — Progress Notes (Signed)
This RN and the NT were giving Swain a bath, a stage two pressure injury was found on R. Heel, no drainage, wound pink, cleansed with mild soap and water dried and placed heel foam on both left and right heels, heels elevated

## 2021-11-07 NOTE — Progress Notes (Addendum)
PROGRESS NOTE  Joshua Donovan ZOX:[id number] DOB: 12-02-1955 DOA: 08/03/2021 PCP: [facility] [place]   LOS: 79 days   Brief Narrative / Interim history: 66 year old with a history of ICH, DM 2, HTN, and HLD who presented to [facility] 7/24 with right hand numbness and weakness and was diagnosed with a small brainstem stroke.  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  He required intubation and was transferred to [facility] for an interventional radiology intervention.  He ultimately required tracheostomy and PEG tube placement, and has subsequently suffered a prolonged hospital stay.  Significant events: 7/24 presented to [facility], ventral medulla CVA 7/25 tx to [facility], treated with Cleviprex 7/26 cerebral angiogram with stent placement to right vertebro-basilar junction, failed extubation, MRI brain> mild extension of stroke, now involving bilateral medial medullary, patent basilar artery and R VBJ stent  7/28 bedside tracheostomy by PCCM and PEG tube placement by General Surgery 7/31 Bleeding around trach site, bright red blood.  Underwent bronchoscopy, Trach Removed, patient orally reintubated, stoma packed. 8/2 ENT took patient to OR for trach redo 8/9 transferred to medical floor on tracheostomy 8/12 vomiting after bolus feeding with worsening hypoxia 8/13 copious bleeding from the heparin injection site controlled with pressure dressing 8/14 bleeding has resolved.  Trach secretions blood-tinged. 8/15 insurance denied LTAC appeal.  CSW working on SNF. 8/28: peer to peer for LTAC looks like this was denied 8/29: V. tach runs - increased metoprolol  9/2: Slightly increased work of breathing-low-grade temps 99 for chest x-ray showed no interval changes 9/5:  Ancef started 2/2 secretions --no wbc or fever 9/12 completed abx course  Currently Awaiting placement  - [name]  Subjective / 24h Interval events: His eyes are open,  appears comfortable  Assesement and Plan: Principal Problem:   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[place]) Active Problems:   Acute respiratory failure with hypoxia ([place])   Hypertension   Uncontrolled type 2 diabetes mellitus with hyperglycemia (HCC)   Mixed hyperlipidemia   Alcohol abuse   NSVT (nonsustained ventricular tachycardia) (HCC)   Dysphagia   Tracheostomy in place ([place])   HAP (hospital-acquired pneumonia)   Principal problem 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[place]) with persistent quadriplegia, dysphagia - s/p stent to right vertebral basilar junction on 08/04/2021, seen by neurology, currently on aspirin, Brilinta and statin for secondary prevention, seen by stroke team.  Continue supportive care.  Continue PT OT as tolerated, await SNF bed.  -PER MD [name] on 10/20/2021 duration of dual antiplatelet therapy is 6 months starting from 08/04/2021 thereafter aspirin alone.   Active problems Acute respiratory failure with hypoxia (HCC) along with aspiration pneumonia-Patient required ICU admission and mechanical ventilation after admission secondary to worsening mentation. Concern for possible aspiration pneumonitis. Patient unable to extubate. Tracheostomy placed on 7/31 and revised by ENT on 08/11/21 secondary to bleeding. Currently stable. He has finished antibiotic treatment for his infections.  Continue supportive care and trach hygiene per RT, his trach secretions continue to be persistently high, repeat trach aspiration and cultures done by PCCM on 10/25/2021,  scopolamine patch started on 10/26/2021. PRN lasix,  PCCM monitoring. -sputum culture showing few serratia/entero.staph-- will treat with short course of IV abx (started 10/22).  Status post completed course of cefepime, last dose 10/28   Alcohol abuse - Initially managed on CIWA.  No signs of DTs now, Continue thiamine, multivitamin and folic acid.   Mixed hyperlipidemia - Continue Lipitor.   Hypertension -  Continue  clonidine, amlodipine, hydrochlorothiazide and Coreg   NSVT (nonsustained ventricular tachycardia) (HCC) EF 60% - Patient with intermittent Vtach. Most recently with 25 beats of vtach on 08/30/21. QTc of 424 msec, -Replete potassium and magnesium, Cardiology consult, maintain mag >2 and k >4, no additioonal cardiac w/u.  Continue beta-blocker.   Dysphagia - Patient is s/p PEG tube and is on tube feeds via PEG. Continue tube feeds and free water.   Tracheostomy complication (HCC)-resolved as of 08/25/2021 - In setting of DAPT. ENT consulted for revision. Bleeding resolved.   Hypokalemia and hypomagnesemia -stopped HCTZ which likely is worsening his hypokalemia, still on scheduled potassium supplementation monitor potassium levels closely.  Recheck tomorrow morning   Uncontrolled type 2 diabetes mellitus with hyperglycemia (HCC) - Most recent hemoglobin A1C of 8.3%. uncontrolled with hyper- and hypoglycemia.  Levemir along with NovoLog every 4 hours.  CBGs monitored as below  CBG (last 3)  Recent Labs    11/06/21 1740 11/07/21 0034 11/07/21 0505  GLUCAP 236* 221* 109*     Scheduled Meds:  amLODipine  10 mg Per Tube Daily   aspirin  81 mg Per Tube Daily   atorvastatin  80 mg Per Tube Daily   carvedilol  37.5 mg Per Tube BID WC   diclofenac Sodium  2 g Topical QID   famotidine  20 mg Per Tube Daily   feeding supplement ([name])  237 mL Per Tube 6 X Daily   feeding supplement (PROSource TF20)  60 mL Per Tube Daily   folic acid  1 mg Per Tube Daily   free water  100 mL Per Tube Q4H   guaiFENesin  10 mL Per Tube Q4H   insulin aspart  0-9 Units Subcutaneous Q6H   insulin detemir  30 Units Subcutaneous BID   multivitamin with minerals  1 tablet Per Tube Daily   mouth rinse  15 mL Mouth Rinse 4 times per day   potassium chloride  40 mEq Per Tube BID   scopolamine  1 patch Transdermal Q72H   thiamine  100 mg Per Tube Daily   ticagrelor  90 mg Per Tube BID   Continuous  Infusions:   PRN Meds:.[DISCONTINUED] acetaminophen **OR** acetaminophen (TYLENOL) oral liquid 160 mg/5 mL **OR** [DISCONTINUED] acetaminophen, bisacodyl, hydrALAZINE, labetalol, liver oil-zinc oxide, mouth rinse, polyethylene glycol, sodium chloride  Current Outpatient Medications  Medication Instructions   ACCU-CHEK FASTCLIX LANCETS MISC Use as directed twice daily dia E11.65    atorvastatin (LIPITOR) 20 mg, Oral, Daily   atorvastatin (LIPITOR) 80 mg, Oral, Daily   Blood Glucose Monitoring Suppl (ACCU-CHEK AVIVA PLUS) w/Device KIT Use as directed   cetirizine (ZYRTEC) 10 mg, Oral, Daily   glucose blood (ACCU-CHEK AVIVA PLUS) test strip Use two times daily to check blood sugar   hydrochlorothiazide (MICROZIDE) 12.5 mg, Oral, Daily   lisinopril (ZESTRIL) 40 mg, Oral, Daily   metFORMIN (GLUCOPHAGE) 1,000 mg, Oral, 2 times daily with meals   metoprolol tartrate (LOPRESSOR) 25 mg, Oral, Daily    Diet Orders (From admission, onward)     Start     Ordered   10/20/21 0819  Diet NPO time specified  Diet effective now       Comments: All medications and diet via feeding tube   10/20/21 0818            DVT prophylaxis: Place TED hose Start: 08/22/21 1546 SCD's Start: 08/03/21 2311   Lab Results  Component Value Date   PLT 330  11/07/2021      Code Status: Full Code  Family Communication: no family at bedside   Status is: Inpatient  Remains inpatient appropriate because: placement pending   Level of care: Med-Surg  Objective: Vitals:   11/06/21 2300 11/07/21 0156 11/07/21 0302 11/07/21 0806  BP: 117/76  (!) 150/86 (!) 146/89  Pulse: 83 79 80 92  Resp: 20 (!) 30 20 (!) 22  Temp: 99.4 F (37.4 C)  98.9 F (37.2 C) 99.4 F (37.4 C)  TempSrc: Axillary  Oral Oral  SpO2: 95% 97% 96% 97%  Weight:      Height:        Intake/Output Summary (Last 24 hours) at 11/07/2021 0944 Last data filed at 11/07/2021 0453 Gross per 24 hour  Intake --  Output 1825 ml  Net -1825  ml    Wt Readings from Last 3 Encounters:  10/04/21 79.8 kg  08/02/21 83.9 kg  02/20/20 88.6 kg    Examination:  Constitutional: nad Respiratory: cta Cardiovascular: rrr  Data Reviewed: I have independently reviewed following labs and imaging studies   CBC Recent Labs  Lab 11/03/21 0726 11/07/21 0324  WBC 8.0 8.1  HGB 12.3* 12.8*  HCT 37.0* 38.2*  PLT 369 330  MCV 81.5 81.6  MCH 27.1 27.4  MCHC 33.2 33.5  RDW 14.7 15.0  LYMPHSABS 1.1  --   MONOABS 0.7  --   EOSABS 0.5  --   BASOSABS 0.0  --      Recent Labs  Lab 11/01/21 0314 11/03/21 0726 11/04/21 0430 11/07/21 0324  NA 138 139 142 138  K 3.7 3.9 3.7 3.9  CL 105 105 107 104  CO2 _0 GLUCOSE 116* 229* 130* 129*  BUN _1 CREATININE 0.51* 0.59* 0.53* 0.45*  CALCIUM 8.9 9.0 9.3 9.1  AST  --  24  --  25  ALT  --  42  --  50*  ALKPHOS  --  37*  --  36*  BILITOT  --  0.4  --  0.5  ALBUMIN  --  2.7*  --  2.9*  MG  --  1.7  --  1.6*     ------------------------------------------------------------------------------------------------------------------ No results for input(s): "CHOL", "HDL", "LDLCALC", "TRIG", "CHOLHDL", "LDLDIRECT" in the last 72 hours.  Lab Results  Component Value Date   HGBA1C 8.3 (H) 08/03/2021   ------------------------------------------------------------------------------------------------------------------ No results for input(s): "TSH", "T4TOTAL", "T3FREE", "THYROIDAB" in the last 72 hours.  Invalid input(s): "FREET3"  Cardiac Enzymes No results for input(s): "CKMB", "TROPONINI", "MYOGLOBIN" in the last 168 hours.  Invalid input(s): "CK" ------------------------------------------------------------------------------------------------------------------    Component Value Date/Time   BNP 15.9 10/26/2021 0817    CBG: Recent Labs  Lab 11/06/21 1310 11/06/21 1549 11/06/21 1740 11/07/21 0034 11/07/21 0505  GLUCAP 210* 183* 236* 221* 109*     No  results found for this or any previous visit (from the past 240 hour(s)).    Radiology Studies: No results found.   Marzetta Board, MD, PhD [facility]  Between 7 am - 7 pm I am available, please contact me via Amion (for emergencies) or Securechat (non urgent messages)  Between 7 pm - 7 am I am not available, please contact night coverage MD/APP via Amion

## 2021-11-08 DIAGNOSIS — L8990 Pressure ulcer of unspecified site, unspecified stage: Secondary | ICD-10-CM | POA: Insufficient documentation

## 2021-11-08 LAB — GLUCOSE, CAPILLARY
Glucose-Capillary: 124 mg/dL — ABNORMAL HIGH (ref 70–99)
Glucose-Capillary: 153 mg/dL — ABNORMAL HIGH (ref 70–99)
Glucose-Capillary: 185 mg/dL — ABNORMAL HIGH (ref 70–99)
Glucose-Capillary: 230 mg/dL — ABNORMAL HIGH (ref 70–99)

## 2021-11-08 NOTE — Progress Notes (Signed)
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  Symptoms worsened and required transfer to [facility] for neuro IR intervention where he had a stent placed in R vertebrovasilar junction.  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  He had a tracheostomy performed on 7/26 c/b dislodgement and ENT revision on 8/2.  Pertinent  Medical History  TBI with ICH, DM, HTN, HLD  Significant Hospital Events: Including procedures, antibiotic start and stop dates in addition to other pertinent events    7/24 presented to [facility], ventral medulla CVA 7/25 tx to [facility] 7/26 cerebral angiogram with stent placement to right vertebro-basilar junction, failed extubation, left radial aline MRI brain> mild extension of stroke, now involving bilateral medial medullary, patent basilar artery and R VBJ stent  7/28 underwent bedside percutaneous tracheostomy and PEG tube placement, tolerated well 7/29 no acute issues overnight currently on SBT trial this a.m. and tolerating well, Cleviprex resumed earlier this a.m. due to severe hypertension 7/30 Hypertension persist despite adding oral agents, glucose remains elevated as well 7/31 Bleeding around trach site, bright red blood. Copious bloody secretions. Cleviprex off, SBPs 150s-160s. Basal insulin/TF coverage increased. Cefepime deescalated to ceftriaxone. CXR stable. Later in afternoon, continued peritrach bleeding. Bronchoscopic eval completed with intratracheal bleeding associated with trach. Removed, patient orally reintubated, stoma packed. 8/1 ENT consulted for trach revision. Lightly sedated. Vent full support/PRVC, given fatigue following events of yesterday. ENT attempted to  evaluate trach at bedside, could not pass scope. Plan for OR 8/2. ASA/Brilinta held. 8/2 to OR for trach redo 8/6 trach collar during day shift, back on vent overnight 8/10 Hemoptysis, blood with suctioning > improved 8/11 8/14 on 8L / 35% ATC  8/28: Stable vitals, increased tenacious secretions from trach. CXR suggesting new LLL basilar opacities atelectasis vs pna 09/07/2021 Decreased volume and thickness of secretions. No fevers. Tracheal aspirate from 8/28 with mod gram positive cocci and few gram negative rods today, culture pending. 9/5 start cefazolin x 7 days for MSSA pneumonia 9/8 Trach exchange   Interim History / Subjective:   No significant changes.  Tolerating physical therapy.  Still very weak Objective   Blood pressure (!) 172/94, pulse 89, temperature 98.8 F (37.1 C), temperature source Oral, resp. rate (!) 23, height '5\' 7"'$  (1.702 m), weight 79.8 kg, SpO2 91 %.    FiO2 (%):  [21 %] 21 %   Intake/Output Summary (Last 24 hours) at 10/25/2021 1113 Last data filed at 10/24/2021 2346 Gross per 24 hour  Intake --  Output 1900 ml  Net -1900 ml    Filed Weights   09/22/21 0459 10/02/21 0500 10/04/21 0500  Weight: 85.7 kg 79.8 kg 79.8 kg    Examination:  General debilitated 66 year old male resting in bed currently no acute distress he just finished physical therapy so he is a little more tachypneic HEENT size 6 trach.  Rhonchorous upper airway noises phonation quality weak Pulmonary tachypneic, but just finished getting back in bed with nursing staff.  Scattered rhonchi Cardiac: Regular rate and rhythm without murmur rub or gallop Abdomen: Soft nontender no organomegaly Extremities: Warm dry Neuro: Awake following commands.  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  Still has fairly significant secretions but tolerating PMV under observation He completed antibiotics on 10/28  Plan Continue routine tracheostomy care Encourage PMV under direct observation during daytime hours Not a candidate for decannulation at this point We will see again in 1 week  Erick Colace ACNP-BC [place] Pager # [phone] OR # [phone] if no answer

## 2021-11-08 NOTE — Progress Notes (Signed)
Physical Therapy Treatment Patient Details Name: Joshua Donovan MRN: [id number] DOB: 1955-12-30 Today's Date: 11/08/2021   History of Present Illness Pt is a 66 y.o. male who presented 08/02/21 with R foot pain and R-sided weakness. Transferred to [facility] 7/25. 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 S/p stent placement to the R vertebrobasilar junction 7/26, failed extubation. Trach and PEG placed 7/28. S/p bronchoscopic evaluation, oral reintubation and tracheostomy removal with stoma packing 7/31 due to trach site bleeding. S/p trach revision 8/2. PMH: DM, GERD, TBI with prior ICH, HLD, HTN    PT Comments    Pt pleasant and agreeable to sessions. Pt transferred to tilt in space w/c with maximove and then taken outside to improve spirits. Completed PROM x 4 extremities. Pt c/o shld pain with ROM bilaterally. Pt reports "that feels good" when doing LE ROM. Pt very appreciative of therapies. Acute PT to cont to follow.    Recommendations for follow up therapy are one component of a multi-disciplinary discharge planning process, led by the attending physician.  Recommendations may be updated based on patient status, additional functional criteria and insurance authorization.  Follow Up Recommendations  Skilled nursing-short term rehab (<3 hours/day) Can patient physically be transported by private vehicle: No   Assistance Recommended at Discharge Frequent or constant Supervision/Assistance  Patient can return home with the following Assistance with cooking/housework;Two people to help with walking and/or transfers;Two people to help  with bathing/dressing/bathroom;Assistance with feeding;Direct supervision/assist for medications management;Direct supervision/assist for financial management;Assist for transportation;Help with stairs or ramp for entrance   Equipment Recommendations  Other (comment)    Recommendations for Other Services       Precautions / Restrictions Precautions Precautions: Fall Precaution Comments: trach collar, PEG, SBP < 180,  PMV on during waking hours if O2 sats are good Restrictions Weight Bearing Restrictions: No     Mobility  Bed Mobility Overal bed mobility: Needs Assistance Bed Mobility: Rolling Rolling: Total assist, +2 for physical assistance         General bed mobility comments: pt requested use of bed pan, pt rolled L/R with totalX2, noted onset of extensor tone, again when rolling L/R to place lift bad under patient    Transfers Overall transfer level: Needs assistance Equipment used:  (maximove) Transfers: Bed to chair/wheelchair/BSC             General transfer comment: pt tolerated transfer well to tilt in space w/c Transfer via Lift Equipment: Maximove  Ambulation/Gait               General Gait Details: unable   Stairs             Wheelchair Mobility    Modified Rankin (Stroke Patients Only) Modified Rankin (Stroke Patients Only) Pre-Morbid Rankin Score: No symptoms Modified Rankin: Severe disability     Balance Overall balance assessment: Needs assistance Sitting-balance support: Feet supported Sitting balance-Leahy Scale: Zero Sitting balance - Comments: cues on head alignment and trunk strengthening while seated on EOB.  Worked on upright positioning and controlled extension and truncal activation without eliciting extensor tone.  Cognition Arousal/Alertness: Awake/alert Behavior During Therapy: WFL for tasks assessed/performed Overall Cognitive Status: Difficult to assess                    Orientation Level: Person, Place, Time Current Attention Level: Selective   Following Commands: Follows one step commands consistently       General Comments: PMSV in place        Exercises General Exercises - Lower Extremity Ankle Circles/Pumps: PROM, Both, 10 reps, Seated (Passive stretch) Short Arc Quad: PROM, Both, 10 reps, Seated Long Arc Quad: PROM, Both, 5 reps, Seated Heel Slides: PROM, Both, 10 reps, Supine Heel Slides Limitations: painful for R knee Hip ABduction/ADduction: PROM, Both, 10 reps, Supine Straight Leg Raises: PROM, 10 reps, Supine, Both Other Exercises Other Exercises: cervical rotation to the L    General Comments General comments (skin integrity, edema, etc.): VSS      Pertinent Vitals/Pain Pain Assessment Pain Assessment: Faces Faces Pain Scale: No hurt    Home Living                          Prior Function            PT Goals (current goals can now be found in the care plan section) Acute Rehab PT Goals Patient Stated Goal: agreeable to session PT Goal Formulation: With patient/family Time For Goal Achievement: 11/12/21 Potential to Achieve Goals: Fair Progress towards PT goals: Progressing toward goals    Frequency    Min 2X/week      PT Plan Current plan remains appropriate    Co-evaluation              AM-PAC PT "6 Clicks" Mobility   Outcome Measure  Help needed turning from your back to your side while in a flat bed without using bedrails?: Total Help needed moving from lying on your back to sitting on the side of a flat bed without using bedrails?: Total Help needed moving to and from a bed to a chair (including a wheelchair)?: Total Help needed standing up from a chair using your arms (e.g., wheelchair or bedside chair)?: Total Help needed to walk in hospital room?: Total Help needed climbing 3-5 steps with a railing? : Total 6 Click Score: 6    End of Session Equipment  Utilized During Treatment: Oxygen (28% via trach collar) Activity Tolerance: Patient tolerated treatment well Patient left: in chair;with call bell/phone within reach;with nursing/sitter in room Nurse Communication: Need for lift equipment;Mobility status PT Visit Diagnosis: Muscle weakness (generalized) (M62.81);Other symptoms and signs involving the nervous system (R29.898);Other abnormalities of gait and mobility (R26.89);Other (comment) Hemiplegia - Right/Left: Right Hemiplegia - dominant/non-dominant: Dominant Hemiplegia - caused by: Cerebral infarction     Time: 1119-1200 PT Time Calculation (min) (ACUTE ONLY): 41 min  Charges:  $Therapeutic Exercise: 23-37 mins $Therapeutic Activity: 8-22 mins                     Kittie Plater, PT, DPT Acute Rehabilitation Services Secure chat preferred Office #: [phone]    Berline Lopes 11/08/2021, 12:54 PM

## 2021-11-08 NOTE — TOC Progression Note (Signed)
Transition of Care [facility]) - Progression Note    Patient Details  Name: Joshua Donovan MRN: [id number] Date of Birth: 07-17-1955  Transition of Care [facility]) CM/SW [place], [place] Phone Number: 11/08/2021, 10:38 AM  Clinical Narrative:     Spoke with patient's daughter Joelene Millin, she confirmed plans to contact [place] and discuss disposition plans with SNF. After conversation with SNF and family, Joelene Millin will follow up with CSW by Wednesday.   TOC will continue to follow and assist with discharge planning.   Thurmond Butts, MSW, LCSW Clinical Social Worker    Expected Discharge Plan: Skilled Nursing Facility Barriers to Discharge: SNF Pending bed offer  Expected Discharge Plan and Services Expected Discharge Plan: [place]   Discharge Planning Services: CM Consult Post Acute Care Choice: Long Term Acute Care (LTAC) Living arrangements for the past 2 months: Single Family Home                                       Social Determinants of Health (SDOH) Interventions    Readmission Risk Interventions     No data to display

## 2021-11-08 NOTE — Progress Notes (Addendum)
PROGRESS NOTE  Joshua Donovan TLX:[id number] DOB: 04-12-1955 DOA: 08/03/2021 PCP: [facility] [place]   LOS: 11 days   Brief Narrative / Interim history: 66 year old with a history of ICH, DM 2, HTN, and HLD who presented to [facility] 7/24 with right hand numbness and weakness and was diagnosed with a small brainstem stroke.  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  He required intubation and was transferred to [facility] for an interventional radiology intervention.  He ultimately required tracheostomy and PEG tube placement, and has subsequently suffered a prolonged hospital stay.  Significant events: 7/24 presented to [facility], ventral medulla CVA 7/25 tx to [facility], treated with Cleviprex 7/26 cerebral angiogram with stent placement to right vertebro-basilar junction, failed extubation, MRI brain> mild extension of stroke, now involving bilateral medial medullary, patent basilar artery and R VBJ stent  7/28 bedside tracheostomy by PCCM and PEG tube placement by General Surgery 7/31 Bleeding around trach site, bright red blood.  Underwent bronchoscopy, Trach Removed, patient orally reintubated, stoma packed. 8/2 ENT took patient to OR for trach redo 8/9 transferred to medical floor on tracheostomy 8/12 vomiting after bolus feeding with worsening hypoxia 8/13 copious bleeding from the heparin injection site controlled with pressure dressing 8/14 bleeding has resolved.  Trach secretions blood-tinged. 8/15 insurance denied LTAC appeal.  CSW working on SNF. 8/28: peer to peer for LTAC looks like this was denied 8/29: V. tach runs - increased metoprolol  9/2: Slightly increased work of breathing-low-grade temps 99 for chest x-ray showed no interval changes 9/5:  Ancef started 2/2 secretions --no wbc or fever 9/12 completed abx course  Currently Awaiting placement  - [name]  Subjective / 24h Interval  events: Comfortable  Assesement and Plan: Principal Problem:   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[place]) Active Problems:   Acute respiratory failure with hypoxia ([place])   Hypertension   Uncontrolled type 2 diabetes mellitus with hyperglycemia (HCC)   Mixed hyperlipidemia   Alcohol abuse   NSVT (nonsustained ventricular tachycardia) (HCC)   Dysphagia   Tracheostomy in place ([place])   HAP (hospital-acquired pneumonia)   Pressure injury of skin   Principal problem 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  Continue supportive care.  Continue PT OT as tolerated, await SNF bed.  -PER MD [name] on 10/20/2021 duration of dual antiplatelet therapy is 6 months starting from 08/04/2021 thereafter aspirin alone.   Active problems Acute respiratory failure with hypoxia (HCC) along with aspiration pneumonia-Patient required ICU admission and mechanical ventilation after admission secondary to worsening mentation. Concern for possible aspiration pneumonitis. Patient unable to extubate. Tracheostomy placed on 7/31 and revised by ENT on 08/11/21 secondary to bleeding. Currently stable. He has finished antibiotic treatment for his infections.  Continue supportive care and trach hygiene per RT, his trach secretions continue to be persistently high, repeat trach aspiration and cultures done by PCCM on 10/25/2021,  scopolamine patch started on 10/26/2021. PRN lasix,  PCCM monitoring. -sputum culture showing few serratia/entero.staph-- will treat with short course of IV abx (started 10/22).  Status post completed course of cefepime, last dose 10/28   Alcohol abuse - Initially managed on CIWA.  No signs of DTs now, Continue thiamine, multivitamin and folic acid.   Mixed hyperlipidemia - Continue Lipitor.  Hypertension - Continue clonidine, amlodipine, hydrochlorothiazide and Coreg   NSVT (nonsustained ventricular tachycardia) (HCC) EF 60% - Patient with intermittent Vtach. Most recently with 25 beats of vtach on 08/30/21. QTc of 424 msec, -Replete potassium and magnesium, Cardiology consult, maintain mag >2 and k >4, no additioonal cardiac w/u.  Continue beta-blocker.   Dysphagia - Patient is s/p PEG tube and is on tube feeds via PEG. Continue tube feeds and free water.   Tracheostomy complication (HCC)-resolved as of 08/25/2021 - In setting of DAPT. ENT consulted for revision. Bleeding resolved.   Hypokalemia and hypomagnesemia -stopped HCTZ which likely is worsening his hypokalemia, still on scheduled potassium supplementation monitor potassium levels closely.  Recheck tomorrow morning   Uncontrolled type 2 diabetes mellitus with hyperglycemia (HCC) - Most recent hemoglobin A1C of 8.3%. uncontrolled with hyper- and hypoglycemia.  Levemir along with NovoLog every 4 hours.  CBGs monitored as below  CBG (last 3)  Recent Labs    11/07/21 1817 11/07/21 2316 11/08/21 0527  GLUCAP 217* 253* 124*     Scheduled Meds:  amLODipine  10 mg Per Tube Daily   aspirin  81 mg Per Tube Daily   atorvastatin  80 mg Per Tube Daily   carvedilol  37.5 mg Per Tube BID WC   diclofenac Sodium  2 g Topical QID   famotidine  20 mg Per Tube Daily   feeding supplement ([name])  237 mL Per Tube 6 X Daily   feeding supplement (PROSource TF20)  60 mL Per Tube Daily   folic acid  1 mg Per Tube Daily   free water  100 mL Per Tube Q4H   guaiFENesin  10 mL Per Tube Q4H   insulin aspart  0-9 Units Subcutaneous Q6H   insulin detemir  30 Units Subcutaneous BID   multivitamin with minerals  1 tablet Per Tube Daily   mouth rinse  15 mL Mouth Rinse 4 times per day   potassium chloride  40 mEq Per Tube BID   scopolamine  1 patch Transdermal Q72H   thiamine  100 mg Per Tube Daily   ticagrelor  90 mg Per  Tube BID   Continuous Infusions:   PRN Meds:.[DISCONTINUED] acetaminophen **OR** acetaminophen (TYLENOL) oral liquid 160 mg/5 mL **OR** [DISCONTINUED] acetaminophen, bisacodyl, hydrALAZINE, labetalol, liver oil-zinc oxide, mouth rinse, polyethylene glycol, sodium chloride  Current Outpatient Medications  Medication Instructions   ACCU-CHEK FASTCLIX LANCETS MISC Use as directed twice daily dia E11.65    atorvastatin (LIPITOR) 20 mg, Oral, Daily   atorvastatin (LIPITOR) 80 mg, Oral, Daily   Blood Glucose Monitoring Suppl (ACCU-CHEK AVIVA PLUS) w/Device KIT Use as directed   cetirizine (ZYRTEC) 10 mg, Oral, Daily   glucose blood (ACCU-CHEK AVIVA PLUS) test strip Use two times daily to check blood sugar   hydrochlorothiazide (MICROZIDE) 12.5 mg, Oral, Daily   lisinopril (ZESTRIL) 40 mg, Oral, Daily   metFORMIN (GLUCOPHAGE) 1,000 mg, Oral, 2 times daily with meals   metoprolol tartrate (LOPRESSOR) 25 mg, Oral, Daily    Diet Orders (From admission, onward)     Start     Ordered   10/20/21 0819  Diet NPO time specified  Diet effective now       Comments: All medications and diet via feeding tube   10/20/21 0818            DVT prophylaxis: Place TED hose Start: 08/22/21 1546 SCD's Start: 08/03/21 2311   Lab Results  Component Value Date   PLT  330 11/07/2021      Code Status: Full Code  Family Communication: no family at bedside   Status is: Inpatient  Remains inpatient appropriate because: placement pending   Level of care: Med-Surg  Objective: Vitals:   11/08/21 0219 11/08/21 0418 11/08/21 0826 11/08/21 0845  BP:  (!) 162/93 (!) 160/90   Pulse:  82 82   Resp:  20 (!) 28 20  Temp:  98.3 F (36.8 C)  98.1 F (36.7 C)  TempSrc:  Axillary  Oral  SpO2: 98% 97% 96%   Weight:      Height:        Intake/Output Summary (Last 24 hours) at 11/08/2021 0922 Last data filed at 11/08/2021 0500 Gross per 24 hour  Intake 1180 ml  Output 1750 ml  Net -570 ml    Wt  Readings from Last 3 Encounters:  10/04/21 79.8 kg  08/02/21 83.9 kg  02/20/20 88.6 kg    Examination:  Constitutional: NAD Respiratory: CTA Cardiovascular: RRR  Data Reviewed: I have independently reviewed following labs and imaging studies   CBC Recent Labs  Lab 11/03/21 0726 11/07/21 0324  WBC 8.0 8.1  HGB 12.3* 12.8*  HCT 37.0* 38.2*  PLT 369 330  MCV 81.5 81.6  MCH 27.1 27.4  MCHC 33.2 33.5  RDW 14.7 15.0  LYMPHSABS 1.1  --   MONOABS 0.7  --   EOSABS 0.5  --   BASOSABS 0.0  --      Recent Labs  Lab 11/03/21 0726 11/04/21 0430 11/07/21 0324  NA 139 142 138  K 3.9 3.7 3.9  CL 105 107 104  CO2 _0 GLUCOSE 229* 130* 129*  BUN _1 CREATININE 0.59* 0.53* 0.45*  CALCIUM 9.0 9.3 9.1  AST 24  --  25  ALT 42  --  50*  ALKPHOS 37*  --  36*  BILITOT 0.4  --  0.5  ALBUMIN 2.7*  --  2.9*  MG 1.7  --  1.6*     ------------------------------------------------------------------------------------------------------------------ No results for input(s): "CHOL", "HDL", "LDLCALC", "TRIG", "CHOLHDL", "LDLDIRECT" in the last 72 hours.  Lab Results  Component Value Date   HGBA1C 8.3 (H) 08/03/2021   ------------------------------------------------------------------------------------------------------------------ No results for input(s): "TSH", "T4TOTAL", "T3FREE", "THYROIDAB" in the last 72 hours.  Invalid input(s): "FREET3"  Cardiac Enzymes No results for input(s): "CKMB", "TROPONINI", "MYOGLOBIN" in the last 168 hours.  Invalid input(s): "CK" ------------------------------------------------------------------------------------------------------------------    Component Value Date/Time   BNP 15.9 10/26/2021 0817    CBG: Recent Labs  Lab 11/07/21 0505 11/07/21 1150 11/07/21 1817 11/07/21 2316 11/08/21 0527  GLUCAP 109* 196* 217* 253* 124*     No results found for this or any previous visit (from the past 240 hour(s)).    Radiology  Studies: No results found.   Marzetta Board, MD, PhD [facility]  Between 7 am - 7 pm I am available, please contact me via Amion (for emergencies) or Securechat (non urgent messages)  Between 7 pm - 7 am I am not available, please contact night coverage MD/APP via Amion

## 2021-11-09 LAB — COMPREHENSIVE METABOLIC PANEL
ALT: 46 U/L — ABNORMAL HIGH (ref 0–44)
AST: 24 U/L (ref 15–41)
Albumin: 2.9 g/dL — ABNORMAL LOW (ref 3.5–5.0)
Alkaline Phosphatase: 37 U/L — ABNORMAL LOW (ref 38–126)
Anion gap: 11 (ref 5–15)
BUN: 11 mg/dL (ref 8–23)
CO2: 26 mmol/L (ref 22–32)
Calcium: 9.3 mg/dL (ref 8.9–10.3)
Chloride: 100 mmol/L (ref 98–111)
Creatinine, Ser: 0.5 mg/dL — ABNORMAL LOW (ref 0.61–1.24)
GFR, Estimated: >60 mL/min (ref >60)
Glucose, Bld: 121 mg/dL — ABNORMAL HIGH (ref 70–99)
Potassium: 3.8 mmol/L (ref 3.5–5.1)
Sodium: 137 mmol/L (ref 135–145)
Total Bilirubin: 0.5 mg/dL (ref 0.3–1.2)
Total Protein: 6.3 g/dL — ABNORMAL LOW (ref 6.5–8.1)

## 2021-11-09 LAB — MAGNESIUM: Magnesium: 1.6 mg/dL — ABNORMAL LOW (ref 1.7–2.4)

## 2021-11-09 LAB — CBC
HCT: 37.4 % — ABNORMAL LOW (ref 39.0–52.0)
Hemoglobin: 12.4 g/dL — ABNORMAL LOW (ref 13.0–17.0)
MCH: 27 pg (ref 26.0–34.0)
MCHC: 33.2 g/dL (ref 30.0–36.0)
MCV: 81.5 fL (ref 80.0–100.0)
Platelets: 321 10*3/uL (ref 150–400)
RBC: 4.59 MIL/uL (ref 4.22–5.81)
RDW: 15.1 % (ref 11.5–15.5)
WBC: 8.1 10*3/uL (ref 4.0–10.5)
nRBC: 0 % (ref 0.0–0.2)

## 2021-11-09 LAB — GLUCOSE, CAPILLARY
Glucose-Capillary: 134 mg/dL — ABNORMAL HIGH (ref 70–99)
Glucose-Capillary: 207 mg/dL — ABNORMAL HIGH (ref 70–99)
Glucose-Capillary: 204 mg/dL — ABNORMAL HIGH (ref 70–99)

## 2021-11-09 MED ORDER — MAGNESIUM SULFATE 2 GM/50ML IV SOLN
2.0000 g | Freq: Once | INTRAVENOUS | Status: AC
  Administered 2021-11-09: 2 g via INTRAVENOUS
  Filled 2021-11-09: qty 50

## 2021-11-09 NOTE — Progress Notes (Signed)
Occupational Therapy Treatment Patient Details Name: Joshua Donovan MRN: [id number] DOB: 1955-10-26 Today's Date: 11/09/2021   History of present illness Pt is a 66 y.o. male who presented 08/02/21 with R foot pain and R-sided weakness. Transferred to [facility] 7/25. 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 S/p stent placement to the R vertebrobasilar junction 7/26, failed extubation. Trach and PEG placed 7/28. S/p bronchoscopic evaluation, oral reintubation and tracheostomy removal with stoma packing 7/31 due to trach site bleeding. S/p trach revision 8/2. PMH: DM, GERD, TBI with prior ICH, HLD, HTN   OT comments  Pt currently still at total +2 for bed mobility during toileting tasks.  Tolerates PROM of BUEs but is exhibiting some increased edema in the hands as well as PROM restrictions at the shoulders, elbows, wrists, and digits.  Will continue to provide PROM and patients daughter has been educated on completion of this during earlier session with handout provided.  Pt declined OOB this session.  Will continue to follow for acute care OT to continue progression toward established OT goals.     Recommendations for follow up therapy are one component of a multi-disciplinary discharge planning process, led by the attending physician.  Recommendations may be updated based on patient status, additional functional criteria and insurance authorization.    Follow Up Recommendations  Skilled nursing-short term rehab (<3 hours/day)    Assistance Recommended at Discharge Frequent or constant Supervision/Assistance  Patient can return home with  the following  Two people to help with walking and/or transfers;Two people to help with bathing/dressing/bathroom;Assistance with cooking/housework;Assistance with feeding;Direct supervision/assist for medications management;Direct supervision/assist for financial management;Assist for transportation;Help with stairs or ramp for entrance   Equipment Recommendations  Other (comment) (TBD next venue of care)       Precautions / Restrictions Precautions Precautions: Fall Precaution Comments: trach collar, PEG, SBP < 180,  PMV on during waking hours if O2 sats are good Restrictions Weight Bearing Restrictions: No       Mobility Bed Mobility Overal bed mobility: Needs Assistance Bed Mobility: Rolling Rolling: Total assist, +2 for physical assistance         General bed mobility comments: Pt needs assist with all aspects of rolling.    Transfers                         Balance                                           ADL either performed or assessed with clinical judgement   ADL Overall ADL's : Needs assistance/impaired                             Toileting- Clothing Manipulation and Hygiene: Total assistance;Bed level;+2 for safety/equipment;Maximal assistance Toileting - Clothing Manipulation Details (indicate cue type and reason): total assist for cleaning up buttocks and applying foam dressing     Functional mobility during ADLs: +2 for physical assistance;Maximal assistance (for bed mobility during toileting tasks) General ADL  Comments: Therapist worked on PROM stretching for BUEs with shoulder flexion, elbow flexion, wrist extension, and digit flexion/extension.  Noted increased tightness with all movements.  Moderate swelling also noted in the hands as well.  Retrograde massage completed on each hand with positioning of elevation on each pillow.  He was able to tolerate approximately 0-100 degrees for RUE shoulder flexion, 0-80  degrees for abduction.  Elbow flexion was 0-130 degrees with increased time with wrist extension limited to approximately 0-40 degrees.  Digit flexion limited to around 60% of normal with increased tightness and resistance at all joints of the fingers.  Joint mobilizations completed along with PROM.  LUE shoulder flexion 0-110 degrees with abduction at 0-70 degrees.  Limited PROM external rotation noted with shoulder flexion to 60-70 degrees as well. Wrist extension PROM 0-60 with digit extension limited secondary to PIP contractures and tightness.  Therapist completed joint mobilizations to the PIP joints with progression to PROM extension and flexion.  PROM extension limited by 10 degrees at PIP and overall pt is able to exhibit 75% of full flexion with tightness.  Pt declined OOB this session but toileting tasks were completed in supine rolling with donning of new foam dressing.  Pillow was placed underneath the pt's right hip to help with pressure relief.      Cognition Arousal/Alertness: Awake/alert Behavior During Therapy: WFL for tasks assessed/performed Overall Cognitive Status: Difficult to assess                                 General Comments: PMSV in place.                   Pertinent Vitals/ Pain       Pain Assessment Pain Assessment: Faces Faces Pain Scale: Hurts a little bit Pain Location: arms with PROM exercises Pain Descriptors / Indicators: Grimacing Pain Intervention(s): Repositioned, Monitored during session, Limited activity within patient's tolerance         Frequency  Min 2X/week        Progress Toward Goals  OT Goals(current goals can now be found in the care plan section)  Progress towards OT goals: Progressing toward goals  Acute Rehab OT Goals OT Goal Formulation: With patient Potential to Achieve Goals: [place] Discharge plan remains appropriate       AM-PAC OT "6 Clicks" Daily Activity     Outcome Measure   Help from  another person eating meals?: Total Help from another person taking care of personal grooming?: Total Help from another person toileting, which includes using toliet, bedpan, or urinal?: Total Help from another person bathing (including washing, rinsing, drying)?: Total Help from another person to put on and taking off regular upper body clothing?: Total Help from another person to put on and taking off regular lower body clothing?: Total 6 Click Score: 6    End of Session Equipment Utilized During Treatment: Oxygen;Other (comment)  OT Visit Diagnosis: Other symptoms and signs involving the nervous system (R29.898);Muscle weakness (generalized) (M62.81);Feeding difficulties (R63.3);Other abnormalities of gait and mobility (R26.89);Unsteadiness on feet (R26.81);Other (comment)   Activity Tolerance Patient tolerated treatment well   Patient Left in bed;with call bell/phone within reach           Time: 1100-1156 OT Time Calculation (min): 56 min  Charges: OT General Charges $OT Visit: 1 Visit OT Treatments $Self Care/Home Management : 23-37 mins $Therapeutic Exercise: 23-37 mins  Haiven Nardone OTR/L  11/09/2021, 2:34 PM

## 2021-11-09 NOTE — Progress Notes (Addendum)
Nutrition Follow-up  DOCUMENTATION CODES:   Not applicable  INTERVENTION:   Continue bolus tube feeds via PEG: - Jevity 1.5 cal 237 ml (1 carton) 6 times per day - PROSource TF20 60 ml daily - Free water flushes 100 ml q 4 hours  Tube feeding regimen provides 2210 kcal, 111 grams of protein, and 1680 ml of H2O.   - MVI with minerals daily  NUTRITION DIAGNOSIS:   Inadequate oral intake related to acute illness as evidenced by NPO status.  Ongoing, being addressed via TF  GOAL:   Patient will meet greater than or equal to 90% of their needs  Met with TF  MONITOR:   Vent status, Labs, Weight trends, TF tolerance  REASON FOR ASSESSMENT:   Consult Enteral/tube feeding initiation and management  ASSESSMENT:   66 yo male admitted post brain stem stroke involving ventral medulla requiring stent placement to right vertebro-basilar junction. PMH includes HTN, TBI, DM, HLD  Patient remains on trach collar and remains NPO. Per CCM note, pt not a candidate for decannulation at this point. Pt awaiting placement.  PEG in place. Receiving bolus feedings of Jevity 1.5 cal 1 carton (237 ml) 6 times per day with PROSource TF20 60 ml once daily. Free water flushes 100 ml every 4 hours. Patient is tolerating well to meet 100% of nutrition needs.  Met with pt at bedside. Pt indicates that he is tolerating his TF without issue. Pt denies any N/V or abdominal discomfort at this time. Will continue with current TF regimen.  Admit weight: 83.9 kg Last weight: 79.8 kg on 10/04/21  Medications reviewed and include: pepcid, folic acid, SSI q 6 hours, levemir 30 units BID, MVI with minerals, klor-con 40 mEq BID, scopolamine patch, thiamine, IV magnesium sulfate 2 grams once   Labs reviewed: magnesium 1.6 CBG's: 134-230 x 24 hours  UOP: 1200 ml x 24 hours  Diet Order:   Diet Order             Diet NPO time specified  Diet effective now                   EDUCATION NEEDS:   Not  appropriate for education at this time  Skin:  Skin Assessment: Skin Integrity Issues: Stage II: R heel Other: non-pressure wound bilateral buttock  Last BM:  11/07/21  Height:   Ht Readings from Last 1 Encounters:  08/04/21 _0  (1.702 m)    Weight:   Wt Readings from Last 1 Encounters:  10/04/21 79.8 kg    BMI:  Body mass index is 27.57 kg/m.  Estimated Nutritional Needs:   Kcal:  2000-2200 kcals  Protein:  100-115 g  Fluid:  >/= 2 L   Gustavus Bryant, MS, RD, LDN Inpatient Clinical Dietitian Please see AMiON for contact information.

## 2021-11-09 NOTE — Progress Notes (Signed)
PROGRESS NOTE  Joshua Donovan NTZ:[id number] DOB: 10-Mar-1955 DOA: 08/03/2021 PCP: [facility]   LOS: 31 days   Brief Narrative / Interim history: 66 year old with a history of ICH, DM 2, HTN, and HLD who presented to [facility] 7/24 with right hand numbness and weakness and was diagnosed with a small brainstem stroke.  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  He required intubation and was transferred to [facility] for an interventional radiology intervention.  He ultimately required tracheostomy and PEG tube placement, and has subsequently suffered a prolonged hospital stay.  Significant events: 7/24 presented to [facility], ventral medulla CVA 7/25 tx to [facility], treated with Cleviprex 7/26 cerebral angiogram with stent placement to right vertebro-basilar junction, failed extubation, MRI brain> mild extension of stroke, now involving bilateral medial medullary, patent basilar artery and R VBJ stent  7/28 bedside tracheostomy by PCCM and PEG tube placement by General Surgery 7/31 Bleeding around trach site, bright red blood.  Underwent bronchoscopy, Trach Removed, patient orally reintubated, stoma packed. 8/2 ENT took patient to OR for trach redo 8/9 transferred to medical floor on tracheostomy 8/12 vomiting after bolus feeding with worsening hypoxia 8/13 copious bleeding from the heparin injection site controlled with pressure dressing 8/14 bleeding has resolved.  Trach secretions blood-tinged. 8/15 insurance denied LTAC appeal.  CSW working on SNF. 8/28: peer to peer for LTAC looks like this was denied 8/29: V. 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[place]) Active Problems:   Acute respiratory failure with hypoxia ([place])   Hypertension   Uncontrolled type 2 diabetes mellitus with hyperglycemia (HCC)   Mixed hyperlipidemia   Alcohol abuse   NSVT (nonsustained ventricular tachycardia) (HCC)   Dysphagia   Tracheostomy in place ([place])   HAP (hospital-acquired pneumonia)   Pressure injury of skin   Principal problem 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  Continue supportive care.  Continue PT OT as tolerated, await SNF bed.  -PER MD [name] on 10/20/2021 duration of dual antiplatelet therapy is 6 months starting from 08/04/2021 thereafter aspirin alone.   Active problems Acute respiratory failure with hypoxia (HCC) along with aspiration pneumonia-Patient required ICU admission and mechanical ventilation after admission secondary to worsening mentation. Concern for possible aspiration pneumonitis. Patient unable to extubate. Tracheostomy placed on 7/31 and revised by ENT on 08/11/21 secondary to bleeding. Currently stable. He has finished antibiotic treatment for his infections.  Most recently grew Serratia / enterobacter / staph aureus, and for next antibiotics on 10/28.  Now monitor off antibiotics.  Afebrile  Alcohol abuse - Initially managed on CIWA.  No signs of DTs now, Continue thiamine, multivitamin and folic acid.   Mixed hyperlipidemia - Continue Lipitor.   Hypertension - Continue antihypertensives as below   NSVT (nonsustained ventricular tachycardia) (HCC) EF 60% -on beta-blocker.  Cardiology consulted one-point but not actively following.   Dysphagia - Patient is s/p PEG tube and is on  tube feeds via PEG.  Continue tube feeds and free water.   Tracheostomy complication (HCC)-resolved as of 08/25/2021 - In setting of DAPT. ENT consulted for revision. Bleeding resolved.   Hypokalemia and hypomagnesemia -stopped HCTZ which likely is worsening his hypokalemia, still on scheduled potassium supplementation monitor potassium levels closely.  Potassium has remained stable   Uncontrolled type 2 diabetes mellitus with hyperglycemia (HCC) - Most recent hemoglobin A1C of 8.3%. uncontrolled with hyper- and hypoglycemia.  Levemir along with NovoLog every 4 hours.  CBGs monitored as below  CBG (last 3)  Recent Labs    11/08/21 1753 11/08/21 2338 11/09/21 0520  GLUCAP 185* 230* 134*     Scheduled Meds:  amLODipine  10 mg Per Tube Daily   aspirin  81 mg Per Tube Daily   atorvastatin  80 mg Per Tube Daily   carvedilol  37.5 mg Per Tube BID WC   diclofenac Sodium  2 g Topical QID   famotidine  20 mg Per Tube Daily   feeding supplement ([name])  237 mL Per Tube 6 X Daily   feeding supplement (PROSource TF20)  60 mL Per Tube Daily   folic acid  1 mg Per Tube Daily   free water  100 mL Per Tube Q4H   guaiFENesin  10 mL Per Tube Q4H   insulin aspart  0-9 Units Subcutaneous Q6H   insulin detemir  30 Units Subcutaneous BID   multivitamin with minerals  1 tablet Per Tube Daily   mouth rinse  15 mL Mouth Rinse 4 times per day   potassium chloride  40 mEq Per Tube BID   scopolamine  1 patch Transdermal Q72H   thiamine  100 mg Per Tube Daily   ticagrelor  90 mg Per Tube BID   Continuous Infusions:  magnesium sulfate bolus IVPB      PRN Meds:.[DISCONTINUED] acetaminophen **OR** acetaminophen (TYLENOL) oral liquid 160 mg/5 mL **OR** [DISCONTINUED] acetaminophen, bisacodyl, hydrALAZINE, labetalol, liver oil-zinc oxide, mouth rinse, polyethylene glycol, sodium chloride  Current Outpatient Medications  Medication Instructions   ACCU-CHEK FASTCLIX LANCETS MISC Use as directed twice daily dia  E11.65    atorvastatin (LIPITOR) 20 mg, Oral, Daily   atorvastatin (LIPITOR) 80 mg, Oral, Daily   Blood Glucose Monitoring Suppl (ACCU-CHEK AVIVA PLUS) w/Device KIT Use as directed   cetirizine (ZYRTEC) 10 mg, Oral, Daily   glucose blood (ACCU-CHEK AVIVA PLUS) test strip Use two times daily to check blood sugar   hydrochlorothiazide (MICROZIDE) 12.5 mg, Oral, Daily   lisinopril (ZESTRIL) 40 mg, Oral, Daily   metFORMIN (GLUCOPHAGE) 1,000 mg, Oral, 2 times daily with meals   metoprolol tartrate (LOPRESSOR) 25 mg, Oral, Daily    Diet Orders (From admission, onward)     Start     Ordered   10/20/21 0819  Diet NPO time specified  Diet effective now       Comments: All medications and diet via feeding tube   10/20/21 0818            DVT prophylaxis: Place TED hose Start: 08/22/21 1546 SCD's Start: 08/03/21 2311   Lab Results  Component Value Date   PLT 321 11/09/2021      Code Status: Full Code  Family Communication: no family at bedside   Status is: Inpatient  Remains inpatient appropriate because: placement pending   Level of care: Med-Surg  Objective: Vitals:   11/09/21 0249 11/09/21 0500 11/09/21 0507 11/09/21 0907  BP:   (!) 143/89  Pulse: 81  82 80  Resp: (!) 22  20 (!) 24  Temp:  97.9 F (36.6 C)    TempSrc:  Oral    SpO2: 97%  100% 99%  Weight:      Height:        Intake/Output Summary (Last 24 hours) at 11/09/2021 1152 Last data filed at 11/09/2021 0400 Gross per 24 hour  Intake 1227 ml  Output 1200 ml  Net 27 ml    Wt Readings from Last 3 Encounters:  10/04/21 79.8 kg  08/02/21 83.9 kg  02/20/20 88.6 kg    Examination:  Constitutional: NAD Eyes: lids and conjunctivae normal, no scleral icterus ENMT: mmm Neck: normal, supple Respiratory: clear to auscultation bilaterally, no wheezing, no crackles. Normal respiratory effort.  Cardiovascular: Regular rate and rhythm, no murmurs / rubs / gallops. No LE edema. Abdomen: soft, no  distention, no tenderness. Bowel sounds positive.  Skin: no rashes Neurologic: no focal deficits, equal strength  Data Reviewed: I have independently reviewed following labs and imaging studies   CBC Recent Labs  Lab 11/03/21 0726 11/07/21 0324 11/09/21 0409  WBC 8.0 8.1 8.1  HGB 12.3* 12.8* 12.4*  HCT 37.0* 38.2* 37.4*  PLT 369 330 321  MCV 81.5 81.6 81.5  MCH 27.1 27.4 27.0  MCHC 33.2 33.5 33.2  RDW 14.7 15.0 15.1  LYMPHSABS 1.1  --   --   MONOABS 0.7  --   --   EOSABS 0.5  --   --   BASOSABS 0.0  --   --      Recent Labs  Lab 11/03/21 0726 11/04/21 0430 11/07/21 0324 11/09/21 0409  NA 139 142 138 137  K 3.9 3.7 3.9 3.8  CL 105 107 104 100  CO2 _0 GLUCOSE 229* 130* 129* 121*  BUN _1 CREATININE 0.59* 0.53* 0.45* 0.50*  CALCIUM 9.0 9.3 9.1 9.3  AST 24  --  25 24  ALT 42  --  50* 46*  ALKPHOS 37*  --  36* 37*  BILITOT 0.4  --  0.5 0.5  ALBUMIN 2.7*  --  2.9* 2.9*  MG 1.7  --  1.6* 1.6*     ------------------------------------------------------------------------------------------------------------------ No results for input(s): "CHOL", "HDL", "LDLCALC", "TRIG", "CHOLHDL", "LDLDIRECT" in the last 72 hours.  Lab Results  Component Value Date   HGBA1C 8.3 (H) 08/03/2021   ------------------------------------------------------------------------------------------------------------------ No results for input(s): "TSH", "T4TOTAL", "T3FREE", "THYROIDAB" in the last 72 hours.  Invalid input(s): "FREET3"  Cardiac Enzymes No results for input(s): "CKMB", "TROPONINI", "MYOGLOBIN" in the last 168 hours.  Invalid input(s): "CK" ------------------------------------------------------------------------------------------------------------------    Component Value Date/Time   BNP 15.9 10/26/2021 0817    CBG: Recent Labs  Lab 11/08/21 0527 11/08/21 1204 11/08/21 1753 11/08/21 2338 11/09/21 0520  GLUCAP 124* 153* 185* 230* 134*     No  results found for this or any previous visit (from the past 240 hour(s)).    Radiology Studies: No results found.   Marzetta Board, MD, PhD [facility]  Between 7 am - 7 pm I am available, please contact me via Amion (for emergencies) or Securechat (non urgent messages)  Between 7 pm - 7 am I am not available, please contact night coverage MD/APP via Amion

## 2021-11-10 LAB — CBC WITH DIFFERENTIAL/PLATELET
Abs Immature Granulocytes: 0.03 10*3/uL (ref 0.00–0.07)
Basophils Absolute: 0.1 10*3/uL (ref 0.0–0.1)
Basophils Relative: 1 %
Eosinophils Absolute: 0.5 10*3/uL (ref 0.0–0.5)
Eosinophils Relative: 6 %
HCT: 39.4 % (ref 39.0–52.0)
Hemoglobin: 12.9 g/dL — ABNORMAL LOW (ref 13.0–17.0)
Immature Granulocytes: 0 %
Lymphocytes Relative: 16 %
Lymphs Abs: 1.5 10*3/uL (ref 0.7–4.0)
MCH: 26.8 pg (ref 26.0–34.0)
MCHC: 32.7 g/dL (ref 30.0–36.0)
MCV: 81.7 fL (ref 80.0–100.0)
Monocytes Absolute: 0.8 10*3/uL (ref 0.1–1.0)
Monocytes Relative: 8 %
Neutro Abs: 6.3 10*3/uL (ref 1.7–7.7)
Neutrophils Relative %: 69 %
Platelets: 371 10*3/uL (ref 150–400)
RBC: 4.82 MIL/uL (ref 4.22–5.81)
RDW: 15.3 % (ref 11.5–15.5)
WBC: 9.2 10*3/uL (ref 4.0–10.5)
nRBC: 0 % (ref 0.0–0.2)

## 2021-11-10 LAB — COMPREHENSIVE METABOLIC PANEL
ALT: 50 U/L — ABNORMAL HIGH (ref 0–44)
AST: 23 U/L (ref 15–41)
Albumin: 3.1 g/dL — ABNORMAL LOW (ref 3.5–5.0)
Alkaline Phosphatase: 43 U/L (ref 38–126)
Anion gap: 10 (ref 5–15)
BUN: 13 mg/dL (ref 8–23)
CO2: 28 mmol/L (ref 22–32)
Calcium: 9.8 mg/dL (ref 8.9–10.3)
Chloride: 101 mmol/L (ref 98–111)
Creatinine, Ser: 0.51 mg/dL — ABNORMAL LOW (ref 0.61–1.24)
GFR, Estimated: >60 mL/min (ref >60)
Glucose, Bld: 147 mg/dL — ABNORMAL HIGH (ref 70–99)
Potassium: 3.9 mmol/L (ref 3.5–5.1)
Sodium: 139 mmol/L (ref 135–145)
Total Bilirubin: 0.5 mg/dL (ref 0.3–1.2)
Total Protein: 6.5 g/dL (ref 6.5–8.1)

## 2021-11-10 LAB — GLUCOSE, CAPILLARY
Glucose-Capillary: 335 mg/dL — ABNORMAL HIGH (ref 70–99)
Glucose-Capillary: 124 mg/dL — ABNORMAL HIGH (ref 70–99)
Glucose-Capillary: 166 mg/dL — ABNORMAL HIGH (ref 70–99)
Glucose-Capillary: 270 mg/dL — ABNORMAL HIGH (ref 70–99)
Glucose-Capillary: 191 mg/dL — ABNORMAL HIGH (ref 70–99)

## 2021-11-10 LAB — MAGNESIUM: Magnesium: 1.9 mg/dL (ref 1.7–2.4)

## 2021-11-10 MED ORDER — FUROSEMIDE 10 MG/ML IJ SOLN
20.0000 mg | Freq: Once | INTRAMUSCULAR | Status: AC
  Administered 2021-11-10: 20 mg via INTRAVENOUS
  Filled 2021-11-10: qty 2

## 2021-11-10 NOTE — TOC Progression Note (Signed)
Transition of Care [facility]) - Progression Note    Patient Details  Name: Joshua Donovan MRN: [id number] Date of Birth: 1955-12-22  Transition of Care [facility]) CM/SW [place], [place] Phone Number: 11/10/2021, 4:30 PM  Clinical Narrative:     Patient's daughter Joelene Millin called- advised has called [facility] but [facility] w/ admission has not returned phone call.  Thurmond Butts, MSW, LCSW Clinical Social Worker    Expected Discharge Plan: Skilled Nursing Facility Barriers to Discharge: SNF Pending bed offer  Expected Discharge Plan and Services Expected Discharge Plan: [place]   Discharge Planning Services: CM Consult Post Acute Care Choice: Long Term Acute Care (LTAC) Living arrangements for the past 2 months: Single Family Home                                       Social Determinants of Health (SDOH) Interventions    Readmission Risk Interventions     No data to display

## 2021-11-10 NOTE — Progress Notes (Signed)
PROGRESS NOTE  Joshua Donovan ZOX:[id number] DOB: 1955/09/24 DOA: 08/03/2021 PCP: [facility]   LOS: 18 days   Brief Narrative / Interim history: 66 year old with a history of ICH, DM 2, HTN, and HLD who presented to [facility] 7/24 with right hand numbness and weakness and was diagnosed with a small brainstem stroke.  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  He required intubation and was transferred to [facility] for an interventional radiology intervention.  He ultimately required tracheostomy and PEG tube placement, and has subsequently suffered a prolonged hospital stay.  Significant events: 7/24 presented to [facility], ventral medulla CVA 7/25 tx to [facility], treated with Cleviprex 7/26 cerebral angiogram with stent placement to right vertebro-basilar junction, failed extubation, MRI brain> mild extension of stroke, now involving bilateral medial medullary, patent basilar artery and R VBJ stent  7/28 bedside tracheostomy by PCCM and PEG tube placement by General Surgery 7/31 Bleeding around trach site, bright red blood.  Underwent bronchoscopy, Trach Removed, patient orally reintubated, stoma packed. 8/2 ENT took patient to OR for trach redo 8/9 transferred to medical floor on tracheostomy 8/12 vomiting after bolus feeding with worsening hypoxia 8/13 copious bleeding from the heparin injection site controlled with pressure dressing 8/14 bleeding has resolved.  Trach secretions blood-tinged. 8/15 insurance denied LTAC appeal.  CSW working on SNF. 8/28: peer to peer for LTAC looks like this was denied 8/29: V. tach runs - increased metoprolol  9/2: Slightly increased work of [place] [address] for chest x-ray showed no interval changes 9/5:  Ancef started 2/2 secretions --no wbc or fever 9/12 completed abx course  10/28 completed another antibiotic course Currently Awaiting placement  - [name]  Subjective /  24h Interval events: No complaints  Assesement and Plan: Principal Problem:   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[place]) Active Problems:   Acute respiratory failure with hypoxia ([place])   Hypertension   Uncontrolled type 2 diabetes mellitus with hyperglycemia (HCC)   Mixed hyperlipidemia   Alcohol abuse   NSVT (nonsustained ventricular tachycardia) (HCC)   Dysphagia   Tracheostomy in place ([place])   HAP (hospital-acquired pneumonia)   Pressure injury of skin    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  Continue supportive care.  Continue PT OT as tolerated, await SNF bed.  -PER MD [name] on 10/20/2021 duration of dual antiplatelet therapy is 6 months starting from 08/04/2021 thereafter aspirin alone.   Acute respiratory failure with hypoxia (HCC) along with aspiration pneumonia-Patient required ICU admission and mechanical ventilation after admission secondary to worsening mentation. Concern for possible aspiration pneumonitis. Patient unable to extubate. Tracheostomy placed on 7/31 and revised by ENT on 08/11/21 secondary to bleeding. Currently stable. He has finished antibiotic treatment for his infections.  Most recently grew Serratia / enterobacter / staph aureus, and for next antibiotics on 10/28.  Now monitor off antibiotics.  Afebrile -using PRN lasix  Alcohol abuse - Initially managed on CIWA.  No signs of DTs now, Continue thiamine, multivitamin and folic acid.   Mixed hyperlipidemia - Continue Lipitor.   Hypertension - Continue antihypertensives as below   NSVT (nonsustained ventricular tachycardia) (HCC) EF 60% -on beta-blocker.  Cardiology consulted one-point but not actively following.   Dysphagia - Patient is s/p PEG tube and is  on tube feeds via PEG. Continue tube  feeds and free water.   Tracheostomy complication (HCC)-resolved as of 08/25/2021 - In setting of DAPT. ENT consulted for revision. Bleeding resolved.   Hypokalemia and hypomagnesemia -stopped HCTZ which likely is worsening his hypokalemia, still on scheduled potassium supplementation monitor potassium levels closely.  Potassium has remained stable   Uncontrolled type 2 diabetes mellitus with hyperglycemia (HCC) - Most recent hemoglobin A1C of 8.3%. uncontrolled with hyper- and hypoglycemia.  Levemir along with NovoLog every 4 hours.  CBGs monitored as below     Scheduled Meds:  amLODipine  10 mg Per Tube Daily   aspirin  81 mg Per Tube Daily   atorvastatin  80 mg Per Tube Daily   carvedilol  37.5 mg Per Tube BID WC   diclofenac Sodium  2 g Topical QID   famotidine  20 mg Per Tube Daily   feeding supplement ([name])  237 mL Per Tube 6 X Daily   feeding supplement (PROSource TF20)  60 mL Per Tube Daily   folic acid  1 mg Per Tube Daily   free water  100 mL Per Tube Q4H   furosemide  20 mg Intravenous Once   guaiFENesin  10 mL Per Tube Q4H   insulin aspart  0-9 Units Subcutaneous Q6H   insulin detemir  30 Units Subcutaneous BID   multivitamin with minerals  1 tablet Per Tube Daily   mouth rinse  15 mL Mouth Rinse 4 times per day   potassium chloride  40 mEq Per Tube BID   scopolamine  1 patch Transdermal Q72H   thiamine  100 mg Per Tube Daily   ticagrelor  90 mg Per Tube BID   Continuous Infusions:    PRN Meds:.[DISCONTINUED] acetaminophen **OR** acetaminophen (TYLENOL) oral liquid 160 mg/5 mL **OR** [DISCONTINUED] acetaminophen, bisacodyl, hydrALAZINE, labetalol, liver oil-zinc oxide, mouth rinse, polyethylene glycol, sodium chloride  Current Outpatient Medications  Medication Instructions   ACCU-CHEK FASTCLIX LANCETS MISC Use as directed twice daily dia E11.65    atorvastatin (LIPITOR) 20 mg, Oral, Daily   atorvastatin (LIPITOR) 80 mg, Oral, Daily   Blood  Glucose Monitoring Suppl (ACCU-CHEK AVIVA PLUS) w/Device KIT Use as directed   cetirizine (ZYRTEC) 10 mg, Oral, Daily   glucose blood (ACCU-CHEK AVIVA PLUS) test strip Use two times daily to check blood sugar   hydrochlorothiazide (MICROZIDE) 12.5 mg, Oral, Daily   lisinopril (ZESTRIL) 40 mg, Oral, Daily   metFORMIN (GLUCOPHAGE) 1,000 mg, Oral, 2 times daily with meals   metoprolol tartrate (LOPRESSOR) 25 mg, Oral, Daily    Diet Orders (From admission, onward)     Start     Ordered   10/20/21 0819  Diet NPO time specified  Diet effective now       Comments: All medications and diet via feeding tube   10/20/21 0818            DVT prophylaxis: Place TED hose Start: 08/22/21 1546 SCD's Start: 08/03/21 2311   Lab Results  Component Value Date   PLT 371 11/10/2021      Code Status: Full Code  Family Communication: no family at bedside   Status is: Inpatient  Remains inpatient appropriate because: placement pending   Level of care: Med-Surg  Objective: Vitals:   11/09/21 2311 11/10/21 0333 11/10/21 0802 11/10/21 0941  BP:  (!) 140/87 (!) 140/88   Pulse:  84 92   Resp:  (!) 34 (!) 30   Temp: 98.2 F (  36.8 C) 98.2 F (36.8 C) 99.1 F (37.3 C)   TempSrc: Axillary Axillary Oral   SpO2:  94% 94% 94%  Weight:      Height:        Intake/Output Summary (Last 24 hours) at 11/10/2021 1039 Last data filed at 11/10/2021 0334 Gross per 24 hour  Intake --  Output 900 ml  Net -900 ml   Wt Readings from Last 3 Encounters:  10/04/21 79.8 kg  08/02/21 83.9 kg  02/20/20 88.6 kg    Examination:   General: Appearance:     Overweight male in no acute distress     Lungs:     Trach in place respirations unlabored  Heart:    Normal heart rate.   MS:   All extremities are intact.   Neurologic:   Awake, alert    Data Reviewed: I have independently reviewed following labs and imaging studies   CBC Recent Labs  Lab 11/07/21 0324 11/09/21 0409 11/10/21 0536  WBC  8.1 8.1 9.2  HGB 12.8* 12.4* 12.9*  HCT 38.2* 37.4* 39.4  PLT 330 321 371  MCV 81.6 81.5 81.7  MCH 27.4 27.0 26.8  MCHC 33.5 33.2 32.7  RDW 15.0 15.1 15.3  LYMPHSABS  --   --  1.5  MONOABS  --   --  0.8  EOSABS  --   --  0.5  BASOSABS  --   --  0.1    Recent Labs  Lab 11/04/21 0430 11/07/21 0324 11/09/21 0409 11/10/21 0536  NA 142 138 137 139  K 3.7 3.9 3.8 3.9  CL 107 104 100 101  CO2 _0 GLUCOSE 130* 129* 121* 147*  BUN _1 CREATININE 0.53* 0.45* 0.50* 0.51*  CALCIUM 9.3 9.1 9.3 9.8  AST  --  _2 ALT  --  50* 46* 50*  ALKPHOS  --  36* 37* 43  BILITOT  --  0.5 0.5 0.5  ALBUMIN  --  2.9* 2.9* 3.1*  MG  --  1.6* 1.6* 1.9    ------------------------------------------------------------------------------------------------------------------ No results for input(s): "CHOL", "HDL", "LDLCALC", "TRIG", "CHOLHDL", "LDLDIRECT" in the last 72 hours.  Lab Results  Component Value Date   HGBA1C 8.3 (H) 08/03/2021   ------------------------------------------------------------------------------------------------------------------ No results for input(s): "TSH", "T4TOTAL", "T3FREE", "THYROIDAB" in the last 72 hours.  Invalid input(s): "FREET3"  Cardiac Enzymes No results for input(s): "CKMB", "TROPONINI", "MYOGLOBIN" in the last 168 hours.  Invalid input(s): "CK" ------------------------------------------------------------------------------------------------------------------    Component Value Date/Time   BNP 15.9 10/26/2021 0817    CBG: Recent Labs  Lab 11/09/21 0520 11/09/21 1202 11/09/21 1643 11/09/21 2312 11/10/21 0502  GLUCAP 134* 207* 204* 335* 124*    No results found for this or any previous visit (from the past 240 hour(s)).    Radiology Studies: No results found.   [name] DO [facility]  Between 7 am - 7 pm I am available, please contact me via Amion (for emergencies) or Securechat (non urgent  messages)  Between 7 pm - 7 am I am not available, please contact night coverage MD/APP via Amion

## 2021-11-10 NOTE — TOC Progression Note (Signed)
Transition of Care [facility]) - Progression Note    Patient Details  Name: Keston Seever MRN: [id number] Date of Birth: May 16, 1955  [facility] CM/SW [place], [place] Phone Number: 11/10/2021, 11:34 AM  Clinical Narrative:     [facility] - left voice message to call CSW back  Expected Discharge Plan: [place] Barriers to Discharge: SNF Pending bed offer  Expected Discharge Plan and Services Expected Discharge Plan: [facility]   Discharge Planning Services: CM Consult Post Acute Care Choice: Long Term Acute Care (LTAC) Living arrangements for the past 2 months: Single Family Home                                       Social Determinants of Health (SDOH) Interventions    Readmission Risk Interventions     No data to display

## 2021-11-11 LAB — GLUCOSE, CAPILLARY
Glucose-Capillary: 106 mg/dL — ABNORMAL HIGH (ref 70–99)
Glucose-Capillary: 215 mg/dL — ABNORMAL HIGH (ref 70–99)
Glucose-Capillary: 220 mg/dL — ABNORMAL HIGH (ref 70–99)
Glucose-Capillary: 267 mg/dL — ABNORMAL HIGH (ref 70–99)

## 2021-11-11 NOTE — Progress Notes (Signed)
Physical Therapy Treatment Patient Details Name: Joshua Donovan MRN: [id number] DOB: 1955-07-10 Today's Date: 11/11/2021   History of Present Illness Pt is a 66 y.o. male who presented 08/02/21 with R foot pain and R-sided weakness. Transferred to [facility] 7/25. 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 S/p stent placement to the R vertebrobasilar junction 7/26, failed extubation. Trach and PEG placed 7/28. S/p bronchoscopic evaluation, oral reintubation and tracheostomy removal with stoma packing 7/31 due to trach site bleeding. S/p trach revision 8/2. PMH: DM, GERD, TBI with prior ICH, HLD, HTN    PT Comments    Pt happy to participate.  Emphasis on warm up, transition to sitting, spinal/cervical extension in sitting, transfer to tilt in space w/c.   Recommendations for follow up therapy are one component of a multi-disciplinary discharge planning process, led by the attending physician.  Recommendations may be updated based on patient status, additional functional criteria and insurance authorization.  Follow Up Recommendations  Skilled nursing-short term rehab (<3 hours/day) Can patient physically be transported by private vehicle: No   Assistance Recommended at Discharge Frequent or constant Supervision/Assistance  Patient can return home with the following Assistance with cooking/housework;Two people to help with walking and/or transfers;Two people to help with bathing/dressing/bathroom;Assistance with feeding;Direct supervision/assist for medications management;Direct supervision/assist for financial management;Assist for  transportation;Help with stairs or ramp for entrance   Equipment Recommendations  Other (comment)    Recommendations for Other Services       Precautions / Restrictions Precautions Precautions: Fall Precaution Comments: trach collar, PEG, SBP < 180,  PMV on during waking hours if O2 sats are good Restrictions Other Position/Activity Restrictions: splint on LUE to manage lines     Mobility  Bed Mobility Overal bed mobility: Needs Assistance Bed Mobility: Supine to Sit Rolling: Total assist, +2 for physical assistance   Supine to sit: Total assist, +2 for physical assistance, +2 for safety/equipment          Transfers Overall transfer level: Needs assistance Equipment used: None Transfers: Bed to chair/wheelchair/BSC, Sit to/from Stand Sit to Stand: Total assist     Squat pivot transfers: Total assist, +2 physical assistance     General transfer comment: face to face standing for pericare from w/c x2 and face to face transfer to w/c from bed.    Ambulation/Gait               General Gait Details: unable   Stairs             Wheelchair Mobility    Modified Rankin (Stroke Patients Only) Modified Rankin (Stroke Patients Only) Pre-Morbid Rankin Score: No symptoms Modified Rankin: Severe disability     Balance Overall balance assessment: Needs assistance Sitting-balance support: Feet supported Sitting balance-Leahy Scale: Zero Sitting balance - Comments: cues on head alignment and trunk strengthening while seated on EOB.  Worked on upright positioning and controlled extension and truncal activation without eliciting extensor tone.     Standing balance-Leahy Scale: Zero                              Cognition Arousal/Alertness: Awake/alert Behavior During Therapy:  WFL for tasks assessed/performed Overall Cognitive Status:  (NT formally)                                          Exercises Other Exercises Other  Exercises: PROM all large joint/muscle groups bil UE's and LE's, cervical rotation Other Exercises: spinal extention in sitting.    General Comments General comments (skin integrity, edema, etc.): vss on TC at 28% FiO2      Pertinent Vitals/Pain Pain Assessment Pain Assessment: Faces Faces Pain Scale: No hurt Pain Intervention(s): Monitored during session    Home Living                          Prior Function            PT Goals (current goals can now be found in the care plan section) Acute Rehab PT Goals PT Goal Formulation: With patient/family Time For Goal Achievement: 11/12/21 Potential to Achieve Goals: Fair Progress towards PT goals: Progressing toward goals    Frequency    Min 2X/week      PT Plan Current plan remains appropriate    Co-evaluation              AM-PAC PT "6 Clicks" Mobility   Outcome Measure  Help needed turning from your back to your side while in a flat bed without using bedrails?: Total Help needed moving from lying on your back to sitting on the side of a flat bed without using bedrails?: Total Help needed moving to and from a bed to a chair (including a wheelchair)?: Total Help needed standing up from a chair using your arms (e.g., wheelchair or bedside chair)?: Total Help needed to walk in hospital room?: Total Help needed climbing 3-5 steps with a railing? : Total 6 Click Score: 6    End of Session   Activity Tolerance: Patient tolerated treatment well Patient left: in chair;with call bell/phone within reach;with nursing/sitter in room Nurse Communication: Need for lift equipment;Mobility status PT Visit Diagnosis: Muscle weakness (generalized) (M62.81);Other symptoms and signs involving the nervous system (R29.898);Other abnormalities of gait and mobility (R26.89);Other (comment) Hemiplegia - Right/Left: Right Hemiplegia - dominant/non-dominant: Dominant Hemiplegia - caused by: Cerebral infarction     Time:  [id number] PT Time Calculation (min) (ACUTE ONLY): 45 min  Charges:  $Therapeutic Exercise: 8-22 mins $Therapeutic Activity: 23-37 mins                     11/11/2021  Ginger Carne., PT Acute Rehabilitation Services [phone]  (office)   Tessie Fass Neiko Trivedi 11/11/2021, 4:58 PM

## 2021-11-11 NOTE — Progress Notes (Signed)
PROGRESS NOTE  Sem Mccaughey YSA:[id number] DOB: 19-May-1955 DOA: 08/03/2021 PCP: [facility] [place]   LOS: 100 days   Brief Narrative / Interim history: 66 year old with a history of ICH, DM 2, HTN, and HLD who presented to [facility] 7/24 with right hand numbness and weakness and was diagnosed with a small brainstem stroke.  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  He required intubation and was transferred to [facility] for an interventional radiology intervention.  He ultimately required tracheostomy and PEG tube placement, and has subsequently suffered a prolonged hospital stay.  Significant events: 7/24 presented to [facility], ventral medulla CVA 7/25 tx to [facility], treated with Cleviprex 7/26 cerebral angiogram with stent placement to right vertebro-basilar junction, failed extubation, MRI brain> mild extension of stroke, now involving bilateral medial medullary, patent basilar artery and R VBJ stent  7/28 bedside tracheostomy by PCCM and PEG tube placement by General Surgery 7/31 Bleeding around trach site, bright red blood.  Underwent bronchoscopy, Trach Removed, patient orally reintubated, stoma packed. 8/2 ENT took patient to OR for trach redo 8/9 transferred to medical floor on tracheostomy 8/12 vomiting after bolus feeding with worsening hypoxia 8/13 copious bleeding from the heparin injection site controlled with pressure dressing 8/14 bleeding has resolved.  Trach secretions blood-tinged. 8/15 insurance denied LTAC appeal.  CSW working on SNF. 8/28: peer to peer for LTAC looks like this was denied 8/29: V. tach runs - increased metoprolol  9/2: Slightly increased work of [place] 99 for chest x-ray showed no interval changes 9/5:  Ancef started 2/2 secretions --no wbc or fever 9/12 completed abx course  10/28 completed another antibiotic course Currently Awaiting placement  - [name]  Subjective  / 24h Interval events: No complaints  Assesement and Plan: Principal Problem:   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[place]) Active Problems:   Acute respiratory failure with hypoxia ([place])   Hypertension   Uncontrolled type 2 diabetes mellitus with hyperglycemia (HCC)   Mixed hyperlipidemia   Alcohol abuse   NSVT (nonsustained ventricular tachycardia) (HCC)   Dysphagia   Tracheostomy in place ([place])   HAP (hospital-acquired pneumonia)   Pressure injury of skin    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  Continue supportive care.  Continue PT OT as tolerated, await SNF bed.  -PER MD [name] on 10/20/2021 duration of dual antiplatelet therapy is 6 months starting from 08/04/2021 thereafter aspirin alone.   Acute respiratory failure with hypoxia (HCC) along with aspiration pneumonia-Patient required ICU admission and mechanical ventilation after admission secondary to worsening mentation. Concern for possible aspiration pneumonitis. Patient unable to extubate. Tracheostomy placed on 7/31 and revised by ENT on 08/11/21 secondary to bleeding. Currently stable. He has finished antibiotic treatment for his infections.  Most recently grew Serratia / enterobacter / staph aureus, and for next antibiotics on 10/28.  Now monitor off antibiotics.  Afebrile -using PRN lasix  Alcohol abuse - Initially managed on CIWA.  No signs of DTs now, Continue thiamine, multivitamin and folic acid.   Mixed hyperlipidemia - Continue Lipitor.   Hypertension - Continue antihypertensives as below   NSVT (nonsustained ventricular tachycardia) (HCC) EF 60% -on beta-blocker.  Cardiology consulted one-point but not actively following.   Dysphagia - Patient is s/p PEG tube and is  on tube feeds via PEG. Continue tube  feeds and free water.   Tracheostomy complication (HCC)-resolved as of 08/25/2021 - In setting of DAPT. ENT consulted for revision. Bleeding resolved.   Hypokalemia and hypomagnesemia -stopped HCTZ which likely is worsening his hypokalemia, still on scheduled potassium supplementation monitor potassium levels closely.  Potassium has remained stable   Uncontrolled type 2 diabetes mellitus with hyperglycemia (HCC) - Most recent hemoglobin A1C of 8.3%. uncontrolled with hyper- and hypoglycemia.  Levemir along with NovoLog every 4 hours.  CBGs monitored as below     Scheduled Meds:  amLODipine  10 mg Per Tube Daily   aspirin  81 mg Per Tube Daily   atorvastatin  80 mg Per Tube Daily   carvedilol  37.5 mg Per Tube BID WC   diclofenac Sodium  2 g Topical QID   famotidine  20 mg Per Tube Daily   feeding supplement ([name])  237 mL Per Tube 6 X Daily   feeding supplement (PROSource TF20)  60 mL Per Tube Daily   folic acid  1 mg Per Tube Daily   free water  100 mL Per Tube Q4H   guaiFENesin  10 mL Per Tube Q4H   insulin aspart  0-9 Units Subcutaneous Q6H   insulin detemir  30 Units Subcutaneous BID   multivitamin with minerals  1 tablet Per Tube Daily   mouth rinse  15 mL Mouth Rinse 4 times per day   potassium chloride  40 mEq Per Tube BID   scopolamine  1 patch Transdermal Q72H   thiamine  100 mg Per Tube Daily   ticagrelor  90 mg Per Tube BID   Continuous Infusions:    PRN Meds:.[DISCONTINUED] acetaminophen **OR** acetaminophen (TYLENOL) oral liquid 160 mg/5 mL **OR** [DISCONTINUED] acetaminophen, bisacodyl, hydrALAZINE, labetalol, liver oil-zinc oxide, mouth rinse, polyethylene glycol, sodium chloride  Current Outpatient Medications  Medication Instructions   ACCU-CHEK FASTCLIX LANCETS MISC Use as directed twice daily dia E11.65    atorvastatin (LIPITOR) 20 mg, Oral, Daily   atorvastatin (LIPITOR) 80 mg, Oral, Daily   Blood Glucose Monitoring Suppl (ACCU-CHEK AVIVA  PLUS) w/Device KIT Use as directed   cetirizine (ZYRTEC) 10 mg, Oral, Daily   glucose blood (ACCU-CHEK AVIVA PLUS) test strip Use two times daily to check blood sugar   hydrochlorothiazide (MICROZIDE) 12.5 mg, Oral, Daily   lisinopril (ZESTRIL) 40 mg, Oral, Daily   metFORMIN (GLUCOPHAGE) 1,000 mg, Oral, 2 times daily with meals   metoprolol tartrate (LOPRESSOR) 25 mg, Oral, Daily    Diet Orders (From admission, onward)     Start     Ordered   10/20/21 0819  Diet NPO time specified  Diet effective now       Comments: All medications and diet via feeding tube   10/20/21 0818            DVT prophylaxis: Place TED hose Start: 08/22/21 1546 SCD's Start: 08/03/21 2311   Lab Results  Component Value Date   PLT 371 11/10/2021      Code Status: Full Code  Family Communication: no family at bedside   Status is: Inpatient  Remains inpatient appropriate because: placement pending   Level of care: Med-Surg  Objective: Vitals:   11/11/21 0316 11/11/21 0756 11/11/21 0834 11/11/21 0918  BP: (!) 140/79  (!) 155/94   Pulse: 83 85 85 89  Resp: (!) 25 (!) 30 20 (!) 22  Temp: 98.3 F (36.8 C)  98.2 F (36.8 C)  TempSrc: Axillary  Oral   SpO2: 98% 100% 94% 98%  Weight:      Height:        Intake/Output Summary (Last 24 hours) at 11/11/2021 1138 Last data filed at 11/11/2021 0834 Gross per 24 hour  Intake --  Output 1600 ml  Net -1600 ml   Wt Readings from Last 3 Encounters:  10/04/21 79.8 kg  08/02/21 83.9 kg  02/20/20 88.6 kg    Examination:  In bed Appears comfortable, trach in place   Data Reviewed: I have independently reviewed following labs and imaging studies   CBC Recent Labs  Lab 11/07/21 0324 11/09/21 0409 11/10/21 0536  WBC 8.1 8.1 9.2  HGB 12.8* 12.4* 12.9*  HCT 38.2* 37.4* 39.4  PLT 330 321 371  MCV 81.6 81.5 81.7  MCH 27.4 27.0 26.8  MCHC 33.5 33.2 32.7  RDW 15.0 15.1 15.3  LYMPHSABS  --   --  1.5  MONOABS  --   --  0.8  EOSABS  --    --  0.5  BASOSABS  --   --  0.1    Recent Labs  Lab 11/07/21 0324 11/09/21 0409 11/10/21 0536  NA 138 137 139  K 3.9 3.8 3.9  CL 104 100 101  CO2 _0 GLUCOSE 129* 121* 147*  BUN _1 CREATININE 0.45* 0.50* 0.51*  CALCIUM 9.1 9.3 9.8  AST _2 ALT 50* 46* 50*  ALKPHOS 36* 37* 43  BILITOT 0.5 0.5 0.5  ALBUMIN 2.9* 2.9* 3.1*  MG 1.6* 1.6* 1.9    ------------------------------------------------------------------------------------------------------------------ No results for input(s): "CHOL", "HDL", "LDLCALC", "TRIG", "CHOLHDL", "LDLDIRECT" in the last 72 hours.  Lab Results  Component Value Date   HGBA1C 8.3 (H) 08/03/2021   ------------------------------------------------------------------------------------------------------------------ No results for input(s): "TSH", "T4TOTAL", "T3FREE", "THYROIDAB" in the last 72 hours.  Invalid input(s): "FREET3"  Cardiac Enzymes No results for input(s): "CKMB", "TROPONINI", "MYOGLOBIN" in the last 168 hours.  Invalid input(s): "CK" ------------------------------------------------------------------------------------------------------------------    Component Value Date/Time   BNP 15.9 10/26/2021 0817    CBG: Recent Labs  Lab 11/10/21 0502 11/10/21 1132 11/10/21 1742 11/10/21 2317 11/11/21 0526  GLUCAP 124* 166* 270* 191* 106*    No results found for this or any previous visit (from the past 240 hour(s)).    Radiology Studies: No results found.   [name] DO [facility]  Between 7 am - 7 pm I am available, please contact me via Amion (for emergencies) or Securechat (non urgent messages)  Between 7 pm - 7 am I am not available, please contact night coverage MD/APP via Amion

## 2021-11-11 NOTE — Progress Notes (Signed)
Speech Language Pathology Treatment: Cognitive-Linquistic;Passy Muir Speaking valve  Patient Details Name: Joshua Donovan MRN: [id number] DOB: August 08, 1955 Today's Date: 11/11/2021 Time: [id number] SLP Time Calculation (min) (ACUTE ONLY): 24 min  Assessment / Plan / Recommendation Clinical Impression  Joshua Donovan pleasant for treatment using PMV, muscle strength training and intelligibility- PMV donned and worn for 20 min. Initially unable to achieve phonation and noted to have congested respirations. With cues to throat clear, firm pressure on abdomen, donning and doffing valve and use of [place] he was ultimately able to clear mucous to phonate in low intensity and hoarse intervals at word level. Pt's intelligibility of responses to questions re: biographical information and naming items in category was around 70% intelligible. Verbal prompts needed to initiate "big" breath before phonation. EMT (expiratory muscle strength training) used and was unable to achieve adequate force with trainer set at 11 cm H20 as set during last session and required setting at 7 cm H20 today, performing several intervals of 5 repetitions.    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 He  presented 08/02/21 with R foot pain and R-sided weakness. Transferred to [facility] 7/25. 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 S/p stent placement to the R vertebrobasilar junction 7/26, failed extubation. Trach and PEG placed 7/28. PMH: DM, GERD, TBI with prior ICH, HLD, HTN      SLP Plan  Continue with current plan of care      Recommendations for follow up  therapy are one component of a multi-disciplinary discharge planning process, led by the attending physician.  Recommendations may be updated based on patient status, additional functional criteria and insurance authorization.    Recommendations         Patient may use Passy-Muir Speech Valve: During all waking hours (remove during sleep) PMSV Supervision: Intermittent         Oral Care Recommendations: Oral care QID Follow Up Recommendations: Skilled nursing-short term rehab (<3 hours/day) Assistance recommended at discharge: Frequent or constant Supervision/Assistance SLP Visit Diagnosis: Aphonia (R49.1);Cognitive communication deficit (U76.546) Plan: Continue with current plan of care           Houston Siren  11/11/2021, 12:31 PM

## 2021-11-12 LAB — GLUCOSE, CAPILLARY
Glucose-Capillary: 192 mg/dL — ABNORMAL HIGH (ref 70–99)
Glucose-Capillary: 132 mg/dL — ABNORMAL HIGH (ref 70–99)
Glucose-Capillary: 286 mg/dL — ABNORMAL HIGH (ref 70–99)
Glucose-Capillary: 266 mg/dL — ABNORMAL HIGH (ref 70–99)

## 2021-11-12 NOTE — Progress Notes (Signed)
PROGRESS NOTE  Joshua Donovan WNU:[id number] DOB: 02-Dec-1955 DOA: 08/03/2021 PCP: [facility] [place]   LOS: 101 days   Brief Narrative / Interim history: 66 year old with a history of ICH, DM 2, HTN, and HLD who presented to [facility] 7/24 with right hand numbness and weakness and was diagnosed with a small brainstem stroke.  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  He required intubation and was transferred to [facility] for an interventional radiology intervention.  He ultimately required tracheostomy and PEG tube placement, and has subsequently suffered a prolonged hospital stay.  Significant events: 7/24 presented to [facility], ventral medulla CVA 7/25 tx to [facility], treated with Cleviprex 7/26 cerebral angiogram with stent placement to right vertebro-basilar junction, failed extubation, MRI brain> mild extension of stroke, now involving bilateral medial medullary, patent basilar artery and R VBJ stent  7/28 bedside tracheostomy by PCCM and PEG tube placement by General Surgery 7/31 Bleeding around trach site, bright red blood.  Underwent bronchoscopy, Trach Removed, patient orally reintubated, stoma packed. 8/2 ENT took patient to OR for trach redo 8/9 transferred to medical floor on tracheostomy 8/12 vomiting after bolus feeding with worsening hypoxia 8/13 copious bleeding from the heparin injection site controlled with pressure dressing 8/14 bleeding has resolved.  Trach secretions blood-tinged. 8/15 insurance denied LTAC appeal.  CSW working on SNF. 8/28: peer to peer for LTAC looks like this was denied 8/29: V. 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[place]) Active Problems:   Acute respiratory failure with hypoxia ([place])   Hypertension   Uncontrolled type 2 diabetes mellitus with hyperglycemia ([place])   Mixed hyperlipidemia   Alcohol abuse   NSVT (nonsustained ventricular tachycardia) (HCC)   Dysphagia   Tracheostomy in place ([place])   HAP (hospital-acquired pneumonia)   Pressure injury of skin    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  Continue supportive care.  Continue PT OT as tolerated, await SNF bed.  -PER MD [name] on 10/20/2021 duration of dual antiplatelet therapy is 6 months starting from 08/04/2021 thereafter aspirin alone.   Acute respiratory failure with hypoxia (HCC) along with aspiration pneumonia-Patient required ICU admission and mechanical ventilation after admission secondary to worsening mentation. Concern for possible aspiration pneumonitis. Patient unable to extubate. Tracheostomy placed on 7/31 and revised by ENT on 08/11/21 secondary to bleeding. Currently stable. He has finished antibiotic treatment for his infections.  Most recently grew Serratia / enterobacter / staph aureus, and for next antibiotics on 10/28.  Now monitor off antibiotics.  Afebrile -using PRN lasix  Alcohol abuse - Initially managed on CIWA.  No signs of DTs now, Continue thiamine, multivitamin and folic acid.   Mixed hyperlipidemia - Continue Lipitor.   Hypertension - Continue antihypertensives as below   NSVT (nonsustained ventricular tachycardia) (HCC) EF 60% -on beta-blocker.  Cardiology consulted one-point but not actively following.   Dysphagia - Patient is  s/p PEG tube and is on tube  feeds via PEG. Continue tube feeds and free water.   Tracheostomy complication (HCC)-resolved as of 08/25/2021 - In setting of DAPT. ENT consulted for revision. Bleeding resolved.   Hypokalemia and hypomagnesemia -stopped HCTZ which likely is worsening his hypokalemia, still on scheduled potassium supplementation monitor potassium levels closely.  Potassium has remained stable   Uncontrolled type 2 diabetes mellitus with hyperglycemia (HCC) - Most recent hemoglobin A1C of 8.3%. uncontrolled with hyper- and hypoglycemia.  Levemir along with NovoLog every 4 hours.  CBGs monitored as below     Scheduled Meds:  amLODipine  10 mg Per Tube Daily   aspirin  81 mg Per Tube Daily   atorvastatin  80 mg Per Tube Daily   carvedilol  37.5 mg Per Tube BID WC   diclofenac Sodium  2 g Topical QID   famotidine  20 mg Per Tube Daily   feeding supplement ([name])  237 mL Per Tube 6 X Daily   feeding supplement (PROSource TF20)  60 mL Per Tube Daily   folic acid  1 mg Per Tube Daily   free water  100 mL Per Tube Q4H   guaiFENesin  10 mL Per Tube Q4H   insulin aspart  0-9 Units Subcutaneous Q6H   insulin detemir  30 Units Subcutaneous BID   multivitamin with minerals  1 tablet Per Tube Daily   mouth rinse  15 mL Mouth Rinse 4 times per day   potassium chloride  40 mEq Per Tube BID   scopolamine  1 patch Transdermal Q72H   thiamine  100 mg Per Tube Daily   ticagrelor  90 mg Per Tube BID   Continuous Infusions:    PRN Meds:.[DISCONTINUED] acetaminophen **OR** acetaminophen (TYLENOL) oral liquid 160 mg/5 mL **OR** [DISCONTINUED] acetaminophen, bisacodyl, hydrALAZINE, labetalol, liver oil-zinc oxide, mouth rinse, polyethylene glycol, sodium chloride  Current Outpatient Medications  Medication Instructions   ACCU-CHEK FASTCLIX LANCETS MISC Use as directed twice daily dia E11.65    atorvastatin (LIPITOR) 20 mg, Oral, Daily   atorvastatin (LIPITOR) 80 mg, Oral, Daily   Blood Glucose  Monitoring Suppl (ACCU-CHEK AVIVA PLUS) w/Device KIT Use as directed   cetirizine (ZYRTEC) 10 mg, Oral, Daily   glucose blood (ACCU-CHEK AVIVA PLUS) test strip Use two times daily to check blood sugar   hydrochlorothiazide (MICROZIDE) 12.5 mg, Oral, Daily   lisinopril (ZESTRIL) 40 mg, Oral, Daily   metFORMIN (GLUCOPHAGE) 1,000 mg, Oral, 2 times daily with meals   metoprolol tartrate (LOPRESSOR) 25 mg, Oral, Daily    Diet Orders (From admission, onward)     Start     Ordered   10/20/21 0819  Diet NPO time specified  Diet effective now       Comments: All medications and diet via feeding tube   10/20/21 0818            DVT prophylaxis: Place TED hose Start: 08/22/21 1546 SCD's Start: 08/03/21 2311   Lab Results  Component Value Date   PLT 371 11/10/2021      Code Status: Full Code  Family Communication: no family at bedside   Status is: Inpatient  Remains inpatient appropriate because: placement pending   Level of care: Med-Surg  Objective: Vitals:   11/12/21 0242 11/12/21 0419 11/12/21 0821 11/12/21 0851  BP:  136/82 127/81 127/81  Pulse: 82 81 84 89  Resp: (!) 25 (!) _0 Temp:  98.2 F (36.8 C) 98.6 F (  37 C)   TempSrc:  Axillary Oral   SpO2: 96% 92% 97% 95%  Weight:      Height:        Intake/Output Summary (Last 24 hours) at 11/12/2021 1135 Last data filed at 11/12/2021 0420 Gross per 24 hour  Intake --  Output 1900 ml  Net -1900 ml   Wt Readings from Last 3 Encounters:  10/04/21 79.8 kg  08/02/21 83.9 kg  02/20/20 88.6 kg    Examination:  In bed Appears comfortable, trach in place   Data Reviewed: I have independently reviewed following labs and imaging studies   CBC Recent Labs  Lab 11/07/21 0324 11/09/21 0409 11/10/21 0536  WBC 8.1 8.1 9.2  HGB 12.8* 12.4* 12.9*  HCT 38.2* 37.4* 39.4  PLT 330 321 371  MCV 81.6 81.5 81.7  MCH 27.4 27.0 26.8  MCHC 33.5 33.2 32.7  RDW 15.0 15.1 15.3  LYMPHSABS  --   --  1.5  MONOABS   --   --  0.8  EOSABS  --   --  0.5  BASOSABS  --   --  0.1    Recent Labs  Lab 11/07/21 0324 11/09/21 0409 11/10/21 0536  NA 138 137 139  K 3.9 3.8 3.9  CL 104 100 101  CO2 _0 GLUCOSE 129* 121* 147*  BUN _1 CREATININE 0.45* 0.50* 0.51*  CALCIUM 9.1 9.3 9.8  AST _2 ALT 50* 46* 50*  ALKPHOS 36* 37* 43  BILITOT 0.5 0.5 0.5  ALBUMIN 2.9* 2.9* 3.1*  MG 1.6* 1.6* 1.9    ------------------------------------------------------------------------------------------------------------------ No results for input(s): "CHOL", "HDL", "LDLCALC", "TRIG", "CHOLHDL", "LDLDIRECT" in the last 72 hours.  Lab Results  Component Value Date   HGBA1C 8.3 (H) 08/03/2021   ------------------------------------------------------------------------------------------------------------------ No results for input(s): "TSH", "T4TOTAL", "T3FREE", "THYROIDAB" in the last 72 hours.  Invalid input(s): "FREET3"  Cardiac Enzymes No results for input(s): "CKMB", "TROPONINI", "MYOGLOBIN" in the last 168 hours.  Invalid input(s): "CK" ------------------------------------------------------------------------------------------------------------------    Component Value Date/Time   BNP 15.9 10/26/2021 0817    CBG: Recent Labs  Lab 11/11/21 1150 11/11/21 1832 11/11/21 2353 11/12/21 0626 11/12/21 1113  GLUCAP 220* 267* 215* 192* 132*    No results found for this or any previous visit (from the past 240 hour(s)).    Radiology Studies: No results found.   [name] DO [facility]  Between 7 am - 7 pm I am available, please contact me via Amion (for emergencies) or Securechat (non urgent messages)  Between 7 pm - 7 am I am not available, please contact night coverage MD/APP via Amion

## 2021-11-12 NOTE — Progress Notes (Signed)
Occupational Therapy Treatment Patient Details Name: Joshua Donovan MRN: [id number] DOB: 02-Aug-1955 Today's Date: 11/12/2021   History of present illness Pt is a 66 y.o. male who presented 08/02/21 with R foot pain and R-sided weakness. Transferred to [facility] 7/25. 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 S/p stent placement to the R vertebrobasilar junction 7/26, failed extubation. Trach and PEG placed 7/28. S/p bronchoscopic evaluation, oral reintubation and tracheostomy removal with stoma packing 7/31 due to trach site bleeding. S/p trach revision 8/2. PMH: DM, GERD, TBI with prior ICH, HLD, HTN   OT comments  Pt improving with endurance related to cervical extension in sitting.  Able to maintain cervical extension for 2 mins after given mod assist for initiation progressing to min assist.  Still with increased extensor tone in head, trunk, arms, and LEs with PROM/AAROM.  Overall, total assist for ADLs bed level with total assist supine to sit and sitting balance.  Recommend continued acute care OT at this time to continue progression with PROM, sitting balance,  head and trunk control, transfers, and basic ADL tasks.       Recommendations for follow up therapy are one component of a multi-disciplinary discharge planning process, led by the attending physician.  Recommendations may be updated based on patient status, additional functional criteria and insurance authorization.    Follow Up Recommendations  Skilled nursing-short term rehab (<3 hours/day)    Assistance Recommended at Discharge Frequent or constant Supervision/Assistance   Patient can return home with the following  Two people to help with walking and/or transfers;Two people to help with bathing/dressing/bathroom;Assistance with cooking/housework;Assistance with feeding;Direct supervision/assist for medications management;Direct supervision/assist for financial management;Assist for transportation;Help with stairs or ramp for entrance   Equipment Recommendations  Other (comment) (TBD next venue of care)       Precautions / Restrictions Precautions Precautions: Fall Precaution Comments: trach collar, PEG, SBP < 180,  PMV on during waking hours if O2 sats are good Restrictions Weight Bearing Restrictions: No       Mobility Bed Mobility Overal bed mobility: Needs Assistance Bed Mobility: Supine to Sit Rolling: Total assist Sidelying to sit: Total assist Supine to sit: Total assist Sit to supine: Total assist   General bed mobility comments: Pt needs assist with all aspects of rolling, supine to sit, and sit to supine    Transfers                   General transfer comment: Pt declined OOB today.     Balance Overall balance assessment: Needs assistance   Sitting balance-Leahy Scale: Zero       Standing balance-Leahy Scale: Zero                             ADL either performed or assessed with clinical judgement   ADL Overall ADL's : Needs assistance/impaired     Grooming: Wash/dry face;Total assistance Grooming Details (indicate cue type and reason): simulated Upper Body Bathing: Total assistance;Bed level Upper Body Bathing Details (indicate cue type and reason): simulated Lower Body Bathing: Total assistance;Bed level Lower Body Bathing Details (indicate cue type and reason): simulated  Functional mobility during ADLs: Total assistance (sitting EOB) General ADL Comments: Pt still with trach collar on medical air at 28% and 5Ls.  Oxygen sats maintained 94% or greater throughout.   Therapist provided stretching with shoulder flexion and abduction as well as digit flexion bilaterally with retrograde massage and positioning for the right hand.  Pt with increased tightness at the MPs and PIPs in both hands, limiting flexion in the RUE and slight extension and flexion in the LUE.  Therapist also completed LE stretching of achilles, hamstrings, and glutes bilaterally.  He was transferred to sitting EOB with total assist for work on trunk and head control.  He was able to elicit cervical extension with overall mod assist and maintained it for over 2 mins while attempting rotation side to side.  Decreased ROM noted with rotation bilaterally.  EOB sitting time for 9 mins before transferring back to supine at total assist.  Respiratory therapist in at end of session.      Cognition Arousal/Alertness: Awake/alert Behavior During Therapy: WFL for tasks assessed/performed Overall Cognitive Status: Within Functional Limits for tasks assessed                                 General Comments: PMSV in place.  Still with extreme difficulty understanding simple speech secondary to low respiratory volume to make consonant sounds.  Attempts to follow simple commands but secondary to severe motor impairment he needs total assist to complete most movements except mod to max for cervical movements.                   Pertinent Vitals/ Pain       Pain Assessment Pain Assessment: Faces Faces Pain Scale: Hurts a little bit Pain Location: arms and legs with PROM exercises/stretching Pain Descriptors / Indicators: Grimacing Pain Intervention(s): Limited activity within patient's tolerance, Monitored during session         Frequency  Min 2X/week        Progress Toward Goals  OT Goals(current goals can now be found in the care plan section)  Progress towards OT goals: Progressing toward goals  Acute Rehab OT Goals Patient Stated Goal: None stated Time For Goal  Achievement: 11/19/21 Potential to Achieve Goals: [place] Discharge plan remains appropriate       AM-PAC OT "6 Clicks" Daily Activity     Outcome Measure   Help from another person eating meals?: Total Help from another person taking care of personal grooming?: Total Help from another person toileting, which includes using toliet, bedpan, or urinal?: Total Help from another person bathing (including washing, rinsing, drying)?: Total Help from another person to put on and taking off regular upper body clothing?: Total Help from another person to put on and taking off regular lower body clothing?: Total 6 Click Score: 6    End of Session Equipment Utilized During Treatment: Oxygen;Other (comment)  OT Visit Diagnosis: Other symptoms and signs involving the nervous system (R29.898);Muscle weakness (generalized) (M62.81);Feeding difficulties (R63.3);Other abnormalities of gait and mobility (R26.89);Unsteadiness on feet (R26.81);Other (comment)   Activity Tolerance Patient tolerated treatment well   Patient Left in bed;with call bell/phone within reach;Other (comment) (Respiratory therapist in the room)   Nurse Communication Need for lift equipment;Other (comment) (O2 sats)        Time: 0865-7846 OT Time Calculation (min): 46 min  Charges: OT General Charges $OT Visit: 1 Visit OT Treatments $Self Care/Home  Management : 8-22 mins $Therapeutic Exercise: 23-37 mins  Alexica Schlossberg OTR/L 11/12/2021, 1:32 PM

## 2021-11-12 NOTE — Procedures (Signed)
Tracheostomy Change Note  Patient Details:   Name: Joshua Donovan DOB: 11-01-1955 MRN: [id number]    Airway Documentation:     Evaluation  O2 sats: stable throughout Complications: No apparent complications Patient did tolerate procedure well. Bilateral Breath Sounds: Rhonchi, Diminished  Per CCM order, RT x2 along with CCM at bedside for tracheostomy change from #6 shiley cuffless to #6 shiley cuffless. Pt did have positive color change on CO2. Pt tolerated well with SVS. All trach supplies are at bedside with head of bed sheet updated. RT will continue to monitor pt.    Jorje Guild 11/12/2021, 5:21 PM

## 2021-11-13 LAB — GLUCOSE, CAPILLARY
Glucose-Capillary: 135 mg/dL — ABNORMAL HIGH (ref 70–99)
Glucose-Capillary: 184 mg/dL — ABNORMAL HIGH (ref 70–99)
Glucose-Capillary: 165 mg/dL — ABNORMAL HIGH (ref 70–99)
Glucose-Capillary: 258 mg/dL — ABNORMAL HIGH (ref 70–99)

## 2021-11-13 NOTE — Progress Notes (Signed)
PROGRESS NOTE  Joshua Donovan GGE:[id number] DOB: Dec 12, 1955 DOA: 08/03/2021 PCP: [facility] [place]   LOS: 102 days   Brief Narrative / Interim history: 66 year old with a history of ICH, DM 2, HTN, and HLD who presented to [facility] 7/24 with right hand numbness and weakness and was diagnosed with a small brainstem stroke.  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  He required intubation and was transferred to [facility] for an interventional radiology intervention.  He ultimately required tracheostomy and PEG tube placement, and has subsequently suffered a prolonged hospital stay.  Significant events: 7/24 presented to [facility], ventral medulla CVA 7/25 tx to [facility], treated with Cleviprex 7/26 cerebral angiogram with stent placement to right vertebro-basilar junction, failed extubation, MRI brain> mild extension of stroke, now involving bilateral medial medullary, patent basilar artery and R VBJ stent  7/28 bedside tracheostomy by PCCM and PEG tube placement by General Surgery 7/31 Bleeding around trach site, bright red blood.  Underwent bronchoscopy, Trach Removed, patient orally reintubated, stoma packed. 8/2 ENT took patient to OR for trach redo 8/9 transferred to medical floor on tracheostomy 8/12 vomiting after bolus feeding with worsening hypoxia 8/13 copious bleeding from the heparin injection site controlled with pressure dressing 8/14 bleeding has resolved.  Trach secretions blood-tinged. 8/15 insurance denied LTAC appeal.  CSW working on SNF. 8/28: peer to peer for LTAC looks like this was denied 8/29: V. 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[place]) Active Problems:   Acute respiratory failure with hypoxia ([place])   Hypertension   Uncontrolled type 2 diabetes mellitus with hyperglycemia (HCC)   Mixed hyperlipidemia   Alcohol abuse   NSVT (nonsustained ventricular tachycardia) (HCC)   Dysphagia   Tracheostomy in place ([place])   HAP (hospital-acquired pneumonia)   Pressure injury of skin    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  Continue supportive care.  Continue PT OT as tolerated, await SNF bed.  -PER MD [name] on 10/20/2021 duration of dual antiplatelet therapy is 6 months starting from 08/04/2021 thereafter aspirin alone.   Acute respiratory failure with hypoxia (HCC) along with aspiration pneumonia-Patient required ICU admission and mechanical ventilation after admission secondary to worsening mentation. Concern for possible aspiration pneumonitis. Patient unable to extubate. Tracheostomy placed on 7/31 and revised by ENT on 08/11/21 secondary to bleeding. Currently stable. He has finished antibiotic treatment for his infections.  Most recently grew Serratia / enterobacter / staph aureus, and for next antibiotics on 10/28.  Now monitor off antibiotics.  Afebrile -using PRN lasix  Alcohol abuse - Initially managed on CIWA.  No signs of DTs now, Continue thiamine, multivitamin and folic acid.   Mixed hyperlipidemia - Continue Lipitor.   Hypertension - Continue antihypertensives as below   NSVT (nonsustained ventricular tachycardia) (HCC) EF 60% -on beta-blocker.  Cardiology consulted one-point but not actively following.   Dysphagia -  Patient is s/p PEG tube and is on  tube feeds via PEG. Continue tube feeds and free water.   Tracheostomy complication (HCC)-resolved as of 08/25/2021 - In setting of DAPT. ENT consulted for revision. Bleeding resolved.   Hypokalemia and hypomagnesemia -stopped HCTZ which likely is worsening his hypokalemia, still on scheduled potassium supplementation monitor potassium levels closely.  Potassium has remained stable   Uncontrolled type 2 diabetes mellitus with hyperglycemia (HCC) - Most recent hemoglobin A1C of 8.3%. uncontrolled with hyper- and hypoglycemia.  Levemir along with NovoLog every 4 hours.  CBGs monitored as below     Scheduled Meds:  amLODipine  10 mg Per Tube Daily   aspirin  81 mg Per Tube Daily   atorvastatin  80 mg Per Tube Daily   carvedilol  37.5 mg Per Tube BID WC   diclofenac Sodium  2 g Topical QID   famotidine  20 mg Per Tube Daily   feeding supplement ([name])  237 mL Per Tube 6 X Daily   feeding supplement (PROSource TF20)  60 mL Per Tube Daily   folic acid  1 mg Per Tube Daily   free water  100 mL Per Tube Q4H   guaiFENesin  10 mL Per Tube Q4H   insulin aspart  0-9 Units Subcutaneous Q6H   insulin detemir  30 Units Subcutaneous BID   multivitamin with minerals  1 tablet Per Tube Daily   mouth rinse  15 mL Mouth Rinse 4 times per day   potassium chloride  40 mEq Per Tube BID   scopolamine  1 patch Transdermal Q72H   thiamine  100 mg Per Tube Daily   ticagrelor  90 mg Per Tube BID   Continuous Infusions:    PRN Meds:.[DISCONTINUED] acetaminophen **OR** acetaminophen (TYLENOL) oral liquid 160 mg/5 mL **OR** [DISCONTINUED] acetaminophen, bisacodyl, hydrALAZINE, labetalol, liver oil-zinc oxide, mouth rinse, polyethylene glycol, sodium chloride  Current Outpatient Medications  Medication Instructions   ACCU-CHEK FASTCLIX LANCETS MISC Use as directed twice daily dia E11.65    atorvastatin (LIPITOR) 20 mg, Oral, Daily   atorvastatin (LIPITOR) 80 mg, Oral, Daily   Blood Glucose  Monitoring Suppl (ACCU-CHEK AVIVA PLUS) w/Device KIT Use as directed   cetirizine (ZYRTEC) 10 mg, Oral, Daily   glucose blood (ACCU-CHEK AVIVA PLUS) test strip Use two times daily to check blood sugar   hydrochlorothiazide (MICROZIDE) 12.5 mg, Oral, Daily   lisinopril (ZESTRIL) 40 mg, Oral, Daily   metFORMIN (GLUCOPHAGE) 1,000 mg, Oral, 2 times daily with meals   metoprolol tartrate (LOPRESSOR) 25 mg, Oral, Daily    Diet Orders (From admission, onward)     Start     Ordered   10/20/21 0819  Diet NPO time specified  Diet effective now       Comments: All medications and diet via feeding tube   10/20/21 0818            DVT prophylaxis: Place TED hose Start: 08/22/21 1546 SCD's Start: 08/03/21 2311   Lab Results  Component Value Date   PLT 371 11/10/2021      Code Status: Full Code  Family Communication: no family at bedside   Status is: Inpatient  Remains inpatient appropriate because: placement pending- per TOC note-- bed available on 11/1 but family to work out financials   Level of care: Med-Surg  Objective: Vitals:   11/13/21 0316 11/13/21 0408 11/13/21 0805 11/13/21 0834  BP: 131/76  (!) 140/92   Pulse: 81  91 92  Resp: (!) 38 20 (!) 21 (!) 24  Temp: 98.2 F (36.8 C)  98.5 F (36.9 C)   TempSrc: Oral  Oral   SpO2: 93%  93% 97%  Weight:      Height:        Intake/Output Summary (Last 24 hours) at 11/13/2021 1043 Last data filed at 11/13/2021 0300 Gross per 24 hour  Intake --  Output 1150 ml  Net -1150 ml   Wt Readings from Last 3 Encounters:  10/04/21 79.8 kg  08/02/21 83.9 kg  02/20/20 88.6 kg    Examination:  In bed Appears comfortable, trach in place   Data Reviewed: I have independently reviewed following labs and imaging studies   CBC Recent Labs  Lab 11/07/21 0324 11/09/21 0409 11/10/21 0536  WBC 8.1 8.1 9.2  HGB 12.8* 12.4* 12.9*  HCT 38.2* 37.4* 39.4  PLT 330 321 371  MCV 81.6 81.5 81.7  MCH 27.4 27.0 26.8  MCHC 33.5  33.2 32.7  RDW 15.0 15.1 15.3  LYMPHSABS  --   --  1.5  MONOABS  --   --  0.8  EOSABS  --   --  0.5  BASOSABS  --   --  0.1    Recent Labs  Lab 11/07/21 0324 11/09/21 0409 11/10/21 0536  NA 138 137 139  K 3.9 3.8 3.9  CL 104 100 101  CO2 _0 GLUCOSE 129* 121* 147*  BUN _1 CREATININE 0.45* 0.50* 0.51*  CALCIUM 9.1 9.3 9.8  AST _2 ALT 50* 46* 50*  ALKPHOS 36* 37* 43  BILITOT 0.5 0.5 0.5  ALBUMIN 2.9* 2.9* 3.1*  MG 1.6* 1.6* 1.9    ------------------------------------------------------------------------------------------------------------------ No results for input(s): "CHOL", "HDL", "LDLCALC", "TRIG", "CHOLHDL", "LDLDIRECT" in the last 72 hours.  Lab Results  Component Value Date   HGBA1C 8.3 (H) 08/03/2021   ------------------------------------------------------------------------------------------------------------------ No results for input(s): "TSH", "T4TOTAL", "T3FREE", "THYROIDAB" in the last 72 hours.  Invalid input(s): "FREET3"  Cardiac Enzymes No results for input(s): "CKMB", "TROPONINI", "MYOGLOBIN" in the last 168 hours.  Invalid input(s): "CK" ------------------------------------------------------------------------------------------------------------------    Component Value Date/Time   BNP 15.9 10/26/2021 0817    CBG: Recent Labs  Lab 11/12/21 1113 11/12/21 1745 11/12/21 2324 11/13/21 0540 11/13/21 0604  GLUCAP 132* 286* 266* 135* 184*    No results found for this or any previous visit (from the past 240 hour(s)).    Radiology Studies: No results found.   [name] DO [facility]  Between 7 am - 7 pm I am available, please contact me via Amion (for emergencies) or Securechat (non urgent messages)  Between 7 pm - 7 am I am not available, please contact night coverage MD/APP via Amion

## 2021-11-14 LAB — GLUCOSE, CAPILLARY
Glucose-Capillary: 172 mg/dL — ABNORMAL HIGH (ref 70–99)
Glucose-Capillary: 190 mg/dL — ABNORMAL HIGH (ref 70–99)
Glucose-Capillary: 237 mg/dL — ABNORMAL HIGH (ref 70–99)
Glucose-Capillary: 246 mg/dL — ABNORMAL HIGH (ref 70–99)

## 2021-11-14 LAB — BASIC METABOLIC PANEL
Anion gap: 8 (ref 5–15)
BUN: 13 mg/dL (ref 8–23)
CO2: 25 mmol/L (ref 22–32)
Calcium: 9.2 mg/dL (ref 8.9–10.3)
Chloride: 106 mmol/L (ref 98–111)
Creatinine, Ser: 0.54 mg/dL — ABNORMAL LOW (ref 0.61–1.24)
GFR, Estimated: >60 mL/min (ref >60)
Glucose, Bld: 114 mg/dL — ABNORMAL HIGH (ref 70–99)
Potassium: 4.2 mmol/L (ref 3.5–5.1)
Sodium: 139 mmol/L (ref 135–145)

## 2021-11-14 NOTE — Progress Notes (Signed)
PROGRESS NOTE  Joshua Donovan TWS:[id number] DOB: Oct 08, 1955 DOA: 08/03/2021 PCP: [facility] [place]   LOS: 103 days   Brief Narrative / Interim history: 66 year old with a history of ICH, DM 2, HTN, and HLD who presented to [facility] 7/24 with right hand numbness and weakness and was diagnosed with a small brainstem stroke.  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  He required intubation and was transferred to [facility] for an interventional radiology intervention.  He ultimately required tracheostomy and PEG tube placement, and has subsequently suffered a prolonged hospital stay.  Significant events: 7/24 presented to [facility], ventral medulla CVA 7/25 tx to [facility], treated with Cleviprex 7/26 cerebral angiogram with stent placement to right vertebro-basilar junction, failed extubation, MRI brain> mild extension of stroke, now involving bilateral medial medullary, patent basilar artery and R VBJ stent  7/28 bedside tracheostomy by PCCM and PEG tube placement by General Surgery 7/31 Bleeding around trach site, bright red blood.  Underwent bronchoscopy, Trach Removed, patient orally reintubated, stoma packed. 8/2 ENT took patient to OR for trach redo 8/9 transferred to medical floor on tracheostomy 8/12 vomiting after bolus feeding with worsening hypoxia 8/13 copious bleeding from the heparin injection site controlled with pressure dressing 8/14 bleeding has resolved.  Trach secretions blood-tinged. 8/15 insurance denied LTAC appeal.  CSW working on SNF. 8/28: peer to peer for LTAC looks like this was denied 8/29: V. tach runs - increased metoprolol  9/2: Slightly increased work of [place] 99 for chest x-ray showed no interval changes 9/5:  Ancef started 2/2 secretions --no wbc or fever 9/12 completed abx course  10/28 completed another antibiotic course Currently Awaiting placement  - [name]- s/p trach  change per RT on 11/3  Subjective / 24h Interval events: No complaints  Assesement and Plan: Principal Problem:   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[place]) Active Problems:   Acute respiratory failure with hypoxia ([place])   Hypertension   Uncontrolled type 2 diabetes mellitus with hyperglycemia (HCC)   Mixed hyperlipidemia   Alcohol abuse   NSVT (nonsustained ventricular tachycardia) (HCC)   Dysphagia   Tracheostomy in place ([place])   HAP (hospital-acquired pneumonia)   Pressure injury of skin    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  Continue supportive care.  Continue PT OT as tolerated, await SNF bed.  -PER MD [name] on 10/20/2021 duration of dual antiplatelet therapy is 6 months starting from 08/04/2021 thereafter aspirin alone.   Acute respiratory failure with hypoxia (HCC) along with aspiration pneumonia-Patient required ICU admission and mechanical ventilation after admission secondary to worsening mentation. Concern for possible aspiration pneumonitis. Patient unable to extubate. Tracheostomy placed on 7/31 and revised by ENT on 08/11/21 secondary to bleeding. Currently stable. He has finished antibiotic treatment for his infections.  Most recently grew Serratia / enterobacter / staph aureus, and for next antibiotics on 10/28.  Now monitor off antibiotics.  Afebrile -using PRN lasix  Alcohol abuse - Initially managed on CIWA.  No signs of DTs now, Continue thiamine, multivitamin and folic acid.   Mixed hyperlipidemia - Continue Lipitor.   Hypertension - Continue antihypertensives as below   NSVT (nonsustained ventricular tachycardia) (HCC) EF 60% -on beta-blocker.  Cardiology consulted one-point but not actively following.   Dysphagia -  Patient is s/p PEG tube and is on  tube feeds via PEG. Continue tube feeds and free water.   Tracheostomy complication (HCC)-resolved as of 08/25/2021 - In setting of DAPT. ENT consulted for revision. Bleeding resolved.   Hypokalemia and hypomagnesemia -stopped HCTZ which likely is worsening his hypokalemia, still on scheduled potassium supplementation monitor potassium levels closely.  Potassium has remained stable   Uncontrolled type 2 diabetes mellitus with hyperglycemia (HCC) - Most recent hemoglobin A1C of 8.3%. uncontrolled with hyper- and hypoglycemia.  Levemir along with NovoLog every 4 hours.  CBGs monitored as below     Scheduled Meds:  amLODipine  10 mg Per Tube Daily   aspirin  81 mg Per Tube Daily   atorvastatin  80 mg Per Tube Daily   carvedilol  37.5 mg Per Tube BID WC   diclofenac Sodium  2 g Topical QID   famotidine  20 mg Per Tube Daily   feeding supplement ([name])  237 mL Per Tube 6 X Daily   feeding supplement (PROSource TF20)  60 mL Per Tube Daily   folic acid  1 mg Per Tube Daily   free water  100 mL Per Tube Q4H   guaiFENesin  10 mL Per Tube Q4H   insulin aspart  0-9 Units Subcutaneous Q6H   insulin detemir  30 Units Subcutaneous BID   multivitamin with minerals  1 tablet Per Tube Daily   mouth rinse  15 mL Mouth Rinse 4 times per day   potassium chloride  40 mEq Per Tube BID   scopolamine  1 patch Transdermal Q72H   thiamine  100 mg Per Tube Daily   ticagrelor  90 mg Per Tube BID   Continuous Infusions:    PRN Meds:.[DISCONTINUED] acetaminophen **OR** acetaminophen (TYLENOL) oral liquid 160 mg/5 mL **OR** [DISCONTINUED] acetaminophen, bisacodyl, hydrALAZINE, labetalol, liver oil-zinc oxide, mouth rinse, polyethylene glycol, sodium chloride  Current Outpatient Medications  Medication Instructions   ACCU-CHEK FASTCLIX LANCETS MISC Use as directed twice daily dia E11.65    atorvastatin (LIPITOR) 20 mg, Oral, Daily   atorvastatin (LIPITOR) 80 mg, Oral, Daily   Blood Glucose  Monitoring Suppl (ACCU-CHEK AVIVA PLUS) w/Device KIT Use as directed   cetirizine (ZYRTEC) 10 mg, Oral, Daily   glucose blood (ACCU-CHEK AVIVA PLUS) test strip Use two times daily to check blood sugar   hydrochlorothiazide (MICROZIDE) 12.5 mg, Oral, Daily   lisinopril (ZESTRIL) 40 mg, Oral, Daily   metFORMIN (GLUCOPHAGE) 1,000 mg, Oral, 2 times daily with meals   metoprolol tartrate (LOPRESSOR) 25 mg, Oral, Daily    Diet Orders (From admission, onward)     Start     Ordered   10/20/21 0819  Diet NPO time specified  Diet effective now       Comments: All medications and diet via feeding tube   10/20/21 0818            DVT prophylaxis: Place TED hose Start: 08/22/21 1546 SCD's Start: 08/03/21 2311   Lab Results  Component Value Date   PLT 371 11/10/2021      Code Status: Full Code  Family Communication: no family at bedside   Status is: Inpatient  Remains inpatient appropriate because: placement pending- per TOC note-- bed available on 11/1 but family to work out financials   Level of care: Med-Surg  Objective: Vitals:   11/14/21 0500 11/14/21 0731 11/14/21 0924 11/14/21 1040  BP: (!) 142/84 (!) 146/87  (!) 140/82  Pulse: 79 88 89  87  Resp: (!) 27 (!) 23 (!) 24 (!) 28  Temp: 98.3 F (36.8 C) 98 F (36.7 C)  98.8 F (37.1 C)  TempSrc: Axillary Oral  Oral  SpO2: 97% 97% 98% 91%  Weight:      Height:        Intake/Output Summary (Last 24 hours) at 11/14/2021 1223 Last data filed at 11/14/2021 0051 Gross per 24 hour  Intake --  Output 1900 ml  Net -1900 ml   Wt Readings from Last 3 Encounters:  10/04/21 79.8 kg  08/02/21 83.9 kg  02/20/20 88.6 kg    Examination:  In bed, NAD Trach in place, comfortable   Data Reviewed: I have independently reviewed following labs and imaging studies   CBC Recent Labs  Lab 11/09/21 0409 11/10/21 0536  WBC 8.1 9.2  HGB 12.4* 12.9*  HCT 37.4* 39.4  PLT 321 371  MCV 81.5 81.7  MCH 27.0 26.8  MCHC 33.2 32.7   RDW 15.1 15.3  LYMPHSABS  --  1.5  MONOABS  --  0.8  EOSABS  --  0.5  BASOSABS  --  0.1    Recent Labs  Lab 11/09/21 0409 11/10/21 0536 11/14/21 0413  NA 137 139 139  K 3.8 3.9 4.2  CL 100 101 106  CO2 _0 GLUCOSE 121* 147* 114*  BUN _1 CREATININE 0.50* 0.51* 0.54*  CALCIUM 9.3 9.8 9.2  AST 24 23  --   ALT 46* 50*  --   ALKPHOS 37* 43  --   BILITOT 0.5 0.5  --   ALBUMIN 2.9* 3.1*  --   MG 1.6* 1.9  --     ------------------------------------------------------------------------------------------------------------------ No results for input(s): "CHOL", "HDL", "LDLCALC", "TRIG", "CHOLHDL", "LDLDIRECT" in the last 72 hours.  Lab Results  Component Value Date   HGBA1C 8.3 (H) 08/03/2021   ------------------------------------------------------------------------------------------------------------------ No results for input(s): "TSH", "T4TOTAL", "T3FREE", "THYROIDAB" in the last 72 hours.  Invalid input(s): "FREET3"  Cardiac Enzymes No results for input(s): "CKMB", "TROPONINI", "MYOGLOBIN" in the last 168 hours.  Invalid input(s): "CK" ------------------------------------------------------------------------------------------------------------------    Component Value Date/Time   BNP 15.9 10/26/2021 0817    CBG: Recent Labs  Lab 11/13/21 0604 11/13/21 1153 11/13/21 1845 11/14/21 0003 11/14/21 0559  GLUCAP 184* 165* 258* 246* 172*    No results found for this or any previous visit (from the past 240 hour(s)).    Radiology Studies: No results found.   [name] DO [facility]  Between 7 am - 7 pm I am available, please contact me via Amion (for emergencies) or Securechat (non urgent messages)  Between 7 pm - 7 am I am not available, please contact night coverage MD/APP via Amion

## 2021-11-15 LAB — GLUCOSE, CAPILLARY
Glucose-Capillary: 191 mg/dL — ABNORMAL HIGH (ref 70–99)
Glucose-Capillary: 197 mg/dL — ABNORMAL HIGH (ref 70–99)
Glucose-Capillary: 200 mg/dL — ABNORMAL HIGH (ref 70–99)
Glucose-Capillary: 224 mg/dL — ABNORMAL HIGH (ref 70–99)

## 2021-11-15 NOTE — Progress Notes (Signed)
Physical Therapy Treatment Patient Details Name: Joshua Donovan MRN: [id number] DOB: 06/28/1955 Today's Date: 11/15/2021   History of Present Illness Pt is a 66 y.o. male who presented 08/02/21 with R foot pain and R-sided weakness. Transferred to [facility] 7/25. 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 S/p stent placement to the R vertebrobasilar junction 7/26, failed extubation. Trach and PEG placed 7/28. S/p bronchoscopic evaluation, oral reintubation and tracheostomy removal with stoma packing 7/31 due to trach site bleeding. S/p trach revision 8/2. PMH: DM, GERD, TBI with prior ICH, HLD, HTN    PT Comments    Patient with slow and limited progress.  Time spent this session assisting with gown change due to significant diaphoresis, positioning in chair for ROM on ankles and to inhibit mass extension and for head positioning.  Patient eager and participatory.  Feel he could benefit from standing frame for upright tolerance, use of his extensor tone and for tone inhibition.  Patient remains appropriate for STSNF.  Goals extended this session.  PT will continue to follow.    Recommendations for follow up therapy are one component of a multi-disciplinary discharge planning process, led by the attending physician.  Recommendations may be updated based on patient status, additional functional criteria and insurance authorization.  Follow Up Recommendations  Skilled nursing-short term rehab (<3 hours/day) Can patient physically be transported by private vehicle: No   Assistance Recommended at Discharge Frequent or constant  Supervision/Assistance  Patient can return home with the following Assistance with cooking/housework;Two people to help with walking and/or transfers;Two people to help with bathing/dressing/bathroom;Assistance with feeding;Direct supervision/assist for medications management;Direct supervision/assist for financial management;Assist for transportation;Help with stairs or ramp for entrance   Equipment Recommendations  Other (comment)    Recommendations for Other Services       Precautions / Restrictions Precautions Precautions: Fall Precaution Comments: trach collar, PEG, SBP < 180,  PMV on during waking hours if O2 sats are good     Mobility  Bed Mobility Overal bed mobility: Needs Assistance Bed Mobility: Rolling Rolling: Total assist         General bed mobility comments: assist to place lift pad    Transfers Overall transfer level: Needs assistance   Transfers: Bed to chair/wheelchair/BSC             General transfer comment: up OOB via lift with extra time to position in chair for safety and comfort and pressure relief    Ambulation/Gait                   Stairs             Wheelchair Mobility    Modified Rankin (Stroke Patients Only) Modified Rankin (Stroke Patients Only) Pre-Morbid Rankin Score: No symptoms Modified Rankin: Severe disability     Balance                                            Cognition Arousal/Alertness: Awake/alert Behavior During Therapy: WFL for tasks assessed/performed Overall Cognitive Status: Impaired/Different from baseline  Memory: Decreased short-term memory Following Commands: Follows one step commands consistently       General Comments: does not remember to speak only one word at a time despite multiple sessions to work with SLP.        Exercises Other Exercises Other Exercises: PROM to ankles in sitting for positioning on foot pedals on  wheelchair Other Exercises: PROM LE's in supine    General Comments General comments (skin integrity, edema, etc.): on TC 28% VSS      Pertinent Vitals/Pain Pain Assessment Pain Assessment: Faces Faces Pain Scale: Hurts a little bit Pain Location: arms and legs with PROM exercises/stretching Pain Descriptors / Indicators: Grimacing Pain Intervention(s): Monitored during session, Limited activity within patient's tolerance    Home Living                          Prior Function            PT Goals (current goals can now be found in the care plan section) Acute Rehab PT Goals Patient Stated Goal: agreeable to session PT Goal Formulation: With patient/family Time For Goal Achievement: 11/29/21 Progress towards PT goals: Progressing toward goals (goals extended)    Frequency    Min 2X/week      PT Plan Current plan remains appropriate    Co-evaluation              AM-PAC PT "6 Clicks" Mobility   Outcome Measure  Help needed turning from your back to your side while in a flat bed without using bedrails?: Total Help needed moving from lying on your back to sitting on the side of a flat bed without using bedrails?: Total Help needed moving to and from a bed to a chair (including a wheelchair)?: Total Help needed standing up from a chair using your arms (e.g., wheelchair or bedside chair)?: Total Help needed to walk in hospital room?: Total Help needed climbing 3-5 steps with a railing? : Total 6 Click Score: 6    End of Session Equipment Utilized During Treatment: Oxygen Activity Tolerance: Patient tolerated treatment well Patient left: in chair;with call bell/phone within reach;with nursing/sitter in room Nurse Communication: Need for lift equipment;Mobility status PT Visit Diagnosis: Muscle weakness (generalized) (M62.81);Other symptoms and signs involving the nervous system (R29.898);Other abnormalities of gait and mobility (R26.89);Other  (comment) Hemiplegia - caused by: Cerebral infarction     Time: 1207-1243 PT Time Calculation (min) (ACUTE ONLY): 36 min  Charges:  $Therapeutic Activity: 23-37 mins                     [name], PT Acute Rehabilitation Services Office:[phone] 11/15/2021    [name] 11/15/2021, 1:55 PM

## 2021-11-15 NOTE — Progress Notes (Signed)
NAME:  Joshua Donovan, MRN:  [id number], DOB:  [birth year], LOS: 72 ADMISSION DATE:  08/03/2021, CONSULTATION DATE:  7/26 REFERRING MD:  Colvin Caroli FOR CONSULT:  CVA   History of Present Illness:  66 y/o male presented to [facility] on 7/24 with R hand numbness and weakness, found to have small brainstem stroke in ventral medulla.  Symptoms worsened and required transfer to [facility] for neuro IR intervention where he had a stent placed in R vertebrovasilar junction.  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  He had a tracheostomy performed on 7/26 c/b dislodgement and ENT revision on 8/2.  Pertinent  Medical History  TBI with ICH, DM, HTN, HLD  Significant Hospital Events: Including procedures, antibiotic start and stop dates in addition to other pertinent events    7/24 presented to [facility], ventral medulla CVA 7/25 tx to [facility] 7/26 cerebral angiogram with stent placement to right vertebro-basilar junction, failed extubation, left radial aline MRI brain> mild extension of stroke, now involving bilateral medial medullary, patent basilar artery and R VBJ stent  7/28 underwent bedside percutaneous tracheostomy and PEG tube placement, tolerated well 7/29 no acute issues overnight currently on SBT trial this a.m. and tolerating well, Cleviprex resumed earlier this a.m. due to severe hypertension 7/30 Hypertension persist despite adding oral agents, glucose remains elevated as well 7/31 Bleeding around trach site, bright red blood. Copious bloody secretions. Cleviprex off, SBPs 150s-160s. Basal insulin/TF coverage increased. Cefepime deescalated to ceftriaxone. CXR stable. Later in afternoon, continued peritrach bleeding. Bronchoscopic eval completed with intratracheal bleeding associated with trach. Removed, patient orally reintubated, stoma packed. 8/1 ENT consulted for trach revision. Lightly sedated. Vent full support/PRVC, given fatigue following events of yesterday. ENT attempted to  evaluate trach at bedside, could not pass scope. Plan for OR 8/2. ASA/Brilinta held. 8/2 to OR for trach redo 8/6 trach collar during day shift, back on vent overnight 8/10 Hemoptysis, blood with suctioning > improved 8/11 8/14 on 8L / 35% ATC  8/28: Stable vitals, increased tenacious secretions from trach. CXR suggesting new LLL basilar opacities atelectasis vs pna 09/07/2021 Decreased volume and thickness of secretions. No fevers. Tracheal aspirate from 8/28 with mod gram positive cocci and few gram negative rods today, culture pending. 9/5 start cefazolin x 7 days for MSSA pneumonia 9/8 Trach exchange   Interim History / Subjective:   No significant changes.  Tolerating physical therapy.  Still very weak Objective   Blood pressure 126/82, pulse 91, temperature 98.9 F (37.2 C), temperature source Oral, resp. rate (!) 29, height '5\' 7"'$  (1.702 m), weight 79.8 kg, SpO2 96 %.    FiO2 (%):  [21 %] 21 %   Intake/Output Summary (Last 24 hours) at 11/15/2021 0844 Last data filed at 11/15/2021 0645 Gross per 24 hour  Intake --  Output 1400 ml  Net -1400 ml    Filed Weights   09/22/21 0459 10/02/21 0500 10/04/21 0500  Weight: 85.7 kg 79.8 kg 79.8 kg    Examination:  General: debilitated male resting in bed currently no acute distress HEENT size 6 trach.  phonation quality weak Pulmonary NWOB on TC room air blow by humidification,   Scattered rhonchi Cardiac: Regular rate and rhythm without murmur rub or gallop Abdomen: Soft nontender no organomegaly Extremities: Warm dry Neuro: Awake following commands.  Very weak Resolved Hospital Problem list   Enterobacter HCAP 7/28  Assessment & Plan:   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 to  R vertebro-basilar junction Acute Hypoxemic Respiratory Failure in setting of Stroke s/p trach  Discussion Weak,   Plan Continue routine tracheostomy care Encourage PMV under direct  observation during daytime hours Not a candidate for decannulation at this point due to weakness and secretion burden We will see again in 1 week  Lanier Clam, MD [place] for contact info

## 2021-11-15 NOTE — Progress Notes (Signed)
Nutrition Follow-up  DOCUMENTATION CODES:   Not applicable  INTERVENTION:   Continue bolus tube feeds via PEG: - Jevity 1.5 cal 237 ml (1 carton) 6 times per day - PROSource TF20 60 ml daily - Free water flushes 100 ml q 4 hours  Tube feeding regimen provides 2210 kcal, 111 grams of protein, and 1680 ml of H2O.   - MVI with minerals daily  NUTRITION DIAGNOSIS:   Inadequate oral intake related to acute illness as evidenced by NPO status.  Ongoing, being addressed via TF  GOAL:   Patient will meet greater than or equal to 90% of their needs  Met with TF  MONITOR:   Vent status, Labs, Weight trends, TF tolerance  REASON FOR ASSESSMENT:   Consult Enteral/tube feeding initiation and management  ASSESSMENT:   66 yo male admitted post brain stem stroke involving ventral medulla requiring stent placement to right vertebro-basilar junction. PMH includes HTN, TBI, DM, HLD  Patient remains on trach collar and remains NPO. Pt awaiting placement. Per PCCM, pt remains not a candidate for decannulation at this point due to weakness and secretion burden.  PEG in place. Receiving bolus feedings of Jevity 1.5 cal 1 carton (237 ml) 6 times per day with PROSource TF20 60 ml once daily. Free water flushes 100 ml every 4 hours. Patient is tolerating well to meet 100% of nutrition needs.  ***  Admit weight: 83.9 kg Last weight: 79.8 kg on 10/04/21  Medications reviewed and include: pepcid, folic acid, SSI q 6 hours, levemir 14 units BID, MVI with minerals, klor-con 40 mEq BID, scopolamine patch, thiamine  Labs reviewed: *** CBG's: *** x 24 hours  UOP: 1200 ml x 24 hours  Diet Order:   Diet Order             Diet NPO time specified  Diet effective now                   EDUCATION NEEDS:   Not appropriate for education at this time  Skin:  Skin Assessment: Skin Integrity Issues: Stage II: R heel Other: non-pressure wound bilateral buttock  Last BM:   11/07/21  Height:   Ht Readings from Last 1 Encounters:  08/04/21 _0  (1.702 m)    Weight:   Wt Readings from Last 1 Encounters:  10/04/21 79.8 kg    BMI:  Body mass index is 27.57 kg/m.  Estimated Nutritional Needs:   Kcal:  2000-2200 kcals  Protein:  100-115 grams  Fluid:  >/= 2 L   Gustavus Bryant, MS, RD, LDN Inpatient Clinical Dietitian Please see AMiON for contact information.

## 2021-11-15 NOTE — Progress Notes (Signed)
PROGRESS NOTE  Joshua Donovan YYQ:[id number] DOB: Jun 07, 1955 DOA: 08/03/2021 PCP: [facility], [place]   LOS: 104 days   Brief Narrative / Interim history: 66 year old with a history of ICH, DM 2, HTN, and HLD who presented to [facility] 7/24 with right hand numbness and weakness and was diagnosed with a small brainstem stroke.  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  He required intubation and was transferred to [facility] for an interventional radiology intervention.  He ultimately required tracheostomy and PEG tube placement, and has subsequently suffered a prolonged hospital stay.   Significant events: 7/24 presented to [facility], ventral medulla CVA 7/25 tx to [facility], treated with Cleviprex 7/26 cerebral angiogram with stent placement to right vertebro-basilar junction, failed extubation, MRI brain> mild extension of stroke, now involving bilateral medial medullary, patent basilar artery and R VBJ stent  7/28 bedside tracheostomy by PCCM and PEG tube placement by General Surgery 7/31 Bleeding around trach site, bright red blood.  Underwent bronchoscopy, Trach Removed, patient orally reintubated, stoma packed. 8/2 ENT took patient to OR for trach redo 8/9 transferred to medical floor on tracheostomy 8/12 vomiting after bolus feeding with worsening hypoxia 8/13 copious bleeding from the heparin injection site controlled with pressure dressing 8/14 bleeding has resolved.  Trach secretions blood-tinged. 8/15 insurance denied LTAC appeal.  CSW working on SNF. 8/28: peer to peer for LTAC looks like this was denied 8/29: V. 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[place]) Active Problems:   Acute respiratory failure with hypoxia ([place])   Hypertension   Uncontrolled type 2 diabetes mellitus with hyperglycemia (HCC)   Mixed hyperlipidemia   Alcohol abuse   NSVT (nonsustained ventricular tachycardia) (HCC)   Dysphagia   Tracheostomy in place ([place])   HAP (hospital-acquired pneumonia)   Pressure injury of skin    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  Continue supportive care.  Continue PT OT as tolerated, await SNF bed.  -PER MD [name] on 10/20/2021 duration of dual antiplatelet therapy is 6 months starting from 08/04/2021 thereafter aspirin alone.   Acute respiratory failure with hypoxia (HCC) along with aspiration pneumonia-Patient required ICU admission and mechanical ventilation after admission secondary to worsening mentation. Concern for possible aspiration pneumonitis. Patient unable to extubate. Tracheostomy placed on 7/31 and revised by ENT on 08/11/21 secondary to bleeding. Currently stable. He has finished antibiotic treatment for his infections.  Most recently grew Serratia / enterobacter / staph aureus, and for next antibiotics on 10/28.  Now monitor off antibiotics.  Afebrile -using PRN lasix  Alcohol abuse - Initially managed on CIWA.  No signs of DTs now, Continue thiamine, multivitamin and folic acid.   Mixed hyperlipidemia - Continue Lipitor.   Hypertension - Continue antihypertensives as below   NSVT (nonsustained ventricular tachycardia) (HCC) EF 60% -on beta-blocker.  Cardiology consulted one-point but not actively following.   Dysphagia -  Patient is s/p PEG tube and is on  tube feeds via PEG. Continue tube feeds and free water.   Tracheostomy complication (HCC)-resolved as of 08/25/2021 - In setting of DAPT. ENT consulted for revision. Bleeding resolved.   Hypokalemia and hypomagnesemia -stopped HCTZ which likely is worsening his hypokalemia, still on scheduled potassium supplementation monitor potassium levels closely.  Potassium has remained stable   Uncontrolled type 2 diabetes mellitus with hyperglycemia (HCC) - Most recent hemoglobin A1C of 8.3%. uncontrolled with hyper- and hypoglycemia.  Levemir along with NovoLog every 4 hours.  CBGs monitored as below     Scheduled Meds:  amLODipine  10 mg Per Tube Daily   aspirin  81 mg Per Tube Daily   atorvastatin  80 mg Per Tube Daily   carvedilol  37.5 mg Per Tube BID WC   diclofenac Sodium  2 g Topical QID   famotidine  20 mg Per Tube Daily   feeding supplement ([name])  237 mL Per Tube 6 X Daily   feeding supplement (PROSource TF20)  60 mL Per Tube Daily   folic acid  1 mg Per Tube Daily   free water  100 mL Per Tube Q4H   guaiFENesin  10 mL Per Tube Q4H   insulin aspart  0-9 Units Subcutaneous Q6H   insulin detemir  30 Units Subcutaneous BID   multivitamin with minerals  1 tablet Per Tube Daily   mouth rinse  15 mL Mouth Rinse 4 times per day   potassium chloride  40 mEq Per Tube BID   scopolamine  1 patch Transdermal Q72H   thiamine  100 mg Per Tube Daily   ticagrelor  90 mg Per Tube BID   Continuous Infusions:    PRN Meds:.[DISCONTINUED] acetaminophen **OR** acetaminophen (TYLENOL) oral liquid 160 mg/5 mL **OR** [DISCONTINUED] acetaminophen, bisacodyl, hydrALAZINE, labetalol, liver oil-zinc oxide, mouth rinse, polyethylene glycol, sodium chloride  Current Outpatient Medications  Medication Instructions   ACCU-CHEK FASTCLIX LANCETS MISC Use as directed twice daily dia E11.65    atorvastatin (LIPITOR) 20 mg, Oral, Daily   atorvastatin (LIPITOR) 80 mg, Oral, Daily   Blood Glucose  Monitoring Suppl (ACCU-CHEK AVIVA PLUS) w/Device KIT Use as directed   cetirizine (ZYRTEC) 10 mg, Oral, Daily   glucose blood (ACCU-CHEK AVIVA PLUS) test strip Use two times daily to check blood sugar   hydrochlorothiazide (MICROZIDE) 12.5 mg, Oral, Daily   lisinopril (ZESTRIL) 40 mg, Oral, Daily   metFORMIN (GLUCOPHAGE) 1,000 mg, Oral, 2 times daily with meals   metoprolol tartrate (LOPRESSOR) 25 mg, Oral, Daily    Diet Orders (From admission, onward)     Start     Ordered   10/20/21 0819  Diet NPO time specified  Diet effective now       Comments: All medications and diet via feeding tube   10/20/21 0818            DVT prophylaxis: Place TED hose Start: 08/22/21 1546 SCD's Start: 08/03/21 2311   Lab Results  Component Value Date   PLT 371 11/10/2021      Code Status: Full Code  Family Communication: no family at bedside   Status is: Inpatient  Remains inpatient appropriate because: placement pending- per TOC note-- bed available on 11/1 but family to work out financials   Level of care: Med-Surg  Objective: Vitals:   11/15/21 0331 11/15/21 0352 11/15/21 0826 11/15/21 0849  BP: (!) 134/90  126/82   Pulse: 84 91  85  Resp: (!) 25 (!) 29  (!) 26  Temp: 98.9 F (37.2 C)  98.9 F (37.2 C)   TempSrc: Oral  Oral   SpO2: 95% 96%  95%  Weight:      Height:        Intake/Output Summary (Last 24 hours) at 11/15/2021 1137 Last data filed at 11/15/2021 0645 Gross per 24 hour  Intake --  Output 1400 ml  Net -1400 ml   Wt Readings from Last 3 Encounters:  10/04/21 79.8 kg  08/02/21 83.9 kg  02/20/20 88.6 kg    Examination:  In bed, NAD Lung diminished but no wheezing   Data Reviewed: I have independently reviewed following labs and imaging studies   CBC Recent Labs  Lab 11/09/21 0409 11/10/21 0536  WBC 8.1 9.2  HGB 12.4* 12.9*  HCT 37.4* 39.4  PLT 321 371  MCV 81.5 81.7  MCH 27.0 26.8  MCHC 33.2 32.7  RDW 15.1 15.3  LYMPHSABS  --  1.5   MONOABS  --  0.8  EOSABS  --  0.5  BASOSABS  --  0.1    Recent Labs  Lab 11/09/21 0409 11/10/21 0536 11/14/21 0413  NA 137 139 139  K 3.8 3.9 4.2  CL 100 101 106  CO2 _0 GLUCOSE 121* 147* 114*  BUN _1 CREATININE 0.50* 0.51* 0.54*  CALCIUM 9.3 9.8 9.2  AST 24 23  --   ALT 46* 50*  --   ALKPHOS 37* 43  --   BILITOT 0.5 0.5  --   ALBUMIN 2.9* 3.1*  --   MG 1.6* 1.9  --     ------------------------------------------------------------------------------------------------------------------ No results for input(s): "CHOL", "HDL", "LDLCALC", "TRIG", "CHOLHDL", "LDLDIRECT" in the last 72 hours.  Lab Results  Component Value Date   HGBA1C 8.3 (H) 08/03/2021   ------------------------------------------------------------------------------------------------------------------ No results for input(s): "TSH", "T4TOTAL", "T3FREE", "THYROIDAB" in the last 72 hours.  Invalid input(s): "FREET3"  Cardiac Enzymes No results for input(s): "CKMB", "TROPONINI", "MYOGLOBIN" in the last 168 hours.  Invalid input(s): "CK" ------------------------------------------------------------------------------------------------------------------    Component Value Date/Time   BNP 15.9 10/26/2021 0817    CBG: Recent Labs  Lab 11/14/21 0003 11/14/21 0559 11/14/21 1220 11/14/21 2339 11/15/21 0634  GLUCAP 246* 172* 190* 237* 191*    No results found for this or any previous visit (from the past 240 hour(s)).    Radiology Studies: No results found.   [name] DO [facility]  Between 7 am - 7 pm I am available, please contact me via Amion (for emergencies) or Securechat (non urgent messages)  Between 7 pm - 7 am I am not available, please contact night coverage MD/APP via Amion

## 2021-11-16 LAB — GLUCOSE, CAPILLARY
Glucose-Capillary: 94 mg/dL (ref 70–99)
Glucose-Capillary: 167 mg/dL — ABNORMAL HIGH (ref 70–99)
Glucose-Capillary: 166 mg/dL — ABNORMAL HIGH (ref 70–99)
Glucose-Capillary: 226 mg/dL — ABNORMAL HIGH (ref 70–99)

## 2021-11-16 NOTE — TOC Progression Note (Signed)
Transition of Care [facility]) - Progression Note    Patient Details  Name: Emersen Carroll MRN: [id number] Date of Birth: Dec 14, 1955  Transition of Care [facility]) CM/SW [place], [employer] Phone Number: 11/16/2021, 10:39 AM  Clinical Narrative:     CSW spoke with [facility] Admissions/[facility]- she states no availability at this time but they anticipate some discharges this week. CSW requested Admission Coordinator to contact family([name]) to address their questions regarding disposition plan. Merrilyn Puma confirmed she will call family.   TOC will continue to follow and assist with discharge planning.   Thurmond Butts, MSW, LCSW Clinical Social Worker    Expected Discharge Plan: Skilled Nursing Facility Barriers to Discharge: SNF Pending bed offer  Expected Discharge Plan and Services Expected Discharge Plan: [place]   Discharge Planning Services: CM Consult Post Acute Care Choice: Long Term Acute Care (LTAC) Living arrangements for the past 2 months: Single Family Home                                       Social Determinants of Health (SDOH) Interventions    Readmission Risk Interventions     No data to display

## 2021-11-16 NOTE — Progress Notes (Signed)
PROGRESS NOTE  Joshua Donovan QMG:[id number] DOB: [birth year] DOA: 08/03/2021 PCP: [facility], [employer]   LOS: 105 days   Brief Narrative / Interim history: 66 year old with a history of ICH, DM 2, HTN, and HLD who presented to [facility] 7/24 with right hand numbness and weakness and was diagnosed with a small brainstem stroke.  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  He required intubation and was transferred to [facility] for an interventional radiology intervention.  He ultimately required tracheostomy and PEG tube placement, and has subsequently suffered a prolonged hospital stay.   Significant events: 7/24 presented to [facility], ventral medulla CVA 7/25 tx to [facility], treated with Cleviprex 7/26 cerebral angiogram with stent placement to right vertebro-basilar junction, failed extubation, MRI brain> mild extension of stroke, now involving bilateral medial medullary, patent basilar artery and R VBJ stent  7/28 bedside tracheostomy by PCCM and PEG tube placement by General Surgery 7/31 Bleeding around trach site, bright red blood.  Underwent bronchoscopy, Trach Removed, patient orally reintubated, stoma packed. 8/2 ENT took patient to OR for trach redo 8/9 transferred to medical floor on tracheostomy 8/12 vomiting after bolus feeding with worsening hypoxia 8/13 copious bleeding from the heparin injection site controlled with pressure dressing 8/14 bleeding has resolved.  Trach secretions blood-tinged. 8/15 insurance denied LTAC appeal.  CSW working on SNF. 8/28: peer to peer for LTAC looks like this was denied 8/29: V. 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[place]) Active Problems:   Acute respiratory failure with hypoxia ([place])   Hypertension   Uncontrolled type 2 diabetes mellitus with hyperglycemia (HCC)   Mixed hyperlipidemia   Alcohol abuse   NSVT (nonsustained ventricular tachycardia) (HCC)   Dysphagia   Tracheostomy in place ([place])   HAP (hospital-acquired pneumonia)   Pressure injury of skin    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  Continue supportive care.  Continue PT OT as tolerated, await SNF bed.  -PER MD [name] on 10/20/2021 duration of dual antiplatelet therapy is 6 months starting from 08/04/2021 thereafter aspirin alone.   Acute respiratory failure with hypoxia (HCC) along with aspiration pneumonia-Patient required ICU admission and mechanical ventilation after admission secondary to worsening mentation. Concern for possible aspiration pneumonitis. Patient unable to extubate. Tracheostomy placed on 7/31 and revised by ENT on 08/11/21 secondary to bleeding. Currently stable. He has finished antibiotic treatment for his infections.  Most recently grew Serratia / enterobacter / staph aureus, and for next antibiotics on 10/28.  Now monitor off antibiotics.  Afebrile -using PRN lasix  Alcohol abuse - Initially managed on CIWA.  No signs of DTs now, Continue thiamine, multivitamin and folic acid.   Mixed hyperlipidemia - Continue Lipitor.   Hypertension - Continue antihypertensives as below   NSVT (nonsustained ventricular tachycardia) (HCC) EF 60% -on beta-blocker.  Cardiology consulted one-point but not actively following.   Dysphagia -  Patient is s/p PEG tube and is on  tube feeds via PEG. Continue tube feeds and free water.   Tracheostomy complication (HCC)-resolved as of 08/25/2021 - In setting of DAPT. ENT consulted for revision. Bleeding resolved.   Hypokalemia and hypomagnesemia -stopped HCTZ which likely is worsening his hypokalemia, still on scheduled potassium supplementation monitor potassium levels closely.  Potassium has remained stable   Uncontrolled type 2 diabetes mellitus with hyperglycemia (HCC) - Most recent hemoglobin A1C of 8.3%. uncontrolled with hyper- and hypoglycemia.  Levemir along with NovoLog every 4 hours.  CBGs monitored as below     Scheduled Meds:  amLODipine  10 mg Per Tube Daily   aspirin  81 mg Per Tube Daily   atorvastatin  80 mg Per Tube Daily   carvedilol  37.5 mg Per Tube BID WC   diclofenac Sodium  2 g Topical QID   famotidine  20 mg Per Tube Daily   feeding supplement ([name])  237 mL Per Tube 6 X Daily   feeding supplement (PROSource TF20)  60 mL Per Tube Daily   folic acid  1 mg Per Tube Daily   free water  100 mL Per Tube Q4H   guaiFENesin  10 mL Per Tube Q4H   insulin aspart  0-9 Units Subcutaneous Q6H   insulin detemir  30 Units Subcutaneous BID   multivitamin with minerals  1 tablet Per Tube Daily   mouth rinse  15 mL Mouth Rinse 4 times per day   potassium chloride  40 mEq Per Tube BID   scopolamine  1 patch Transdermal Q72H   thiamine  100 mg Per Tube Daily   ticagrelor  90 mg Per Tube BID   Continuous Infusions:    PRN Meds:.[DISCONTINUED] acetaminophen **OR** acetaminophen (TYLENOL) oral liquid 160 mg/5 mL **OR** [DISCONTINUED] acetaminophen, bisacodyl, hydrALAZINE, labetalol, liver oil-zinc oxide, mouth rinse, polyethylene glycol, sodium chloride  Current Outpatient Medications  Medication Instructions   ACCU-CHEK FASTCLIX LANCETS MISC Use as directed twice daily dia E11.65    atorvastatin (LIPITOR) 20 mg, Oral, Daily   atorvastatin (LIPITOR) 80 mg, Oral, Daily   Blood Glucose  Monitoring Suppl (ACCU-CHEK AVIVA PLUS) w/Device KIT Use as directed   cetirizine (ZYRTEC) 10 mg, Oral, Daily   glucose blood (ACCU-CHEK AVIVA PLUS) test strip Use two times daily to check blood sugar   hydrochlorothiazide (MICROZIDE) 12.5 mg, Oral, Daily   lisinopril (ZESTRIL) 40 mg, Oral, Daily   metFORMIN (GLUCOPHAGE) 1,000 mg, Oral, 2 times daily with meals   metoprolol tartrate (LOPRESSOR) 25 mg, Oral, Daily    Diet Orders (From admission, onward)     Start     Ordered   10/20/21 0819  Diet NPO time specified  Diet effective now       Comments: All medications and diet via feeding tube   10/20/21 0818            DVT prophylaxis: Place TED hose Start: 08/22/21 1546 SCD's Start: 08/03/21 2311   Lab Results  Component Value Date   PLT 371 11/10/2021      Code Status: Full Code  Family Communication: friend at bedside  Status is: Inpatient  Remains inpatient appropriate because: placement pending- per TOC note-- bed available on 11/1 but family to work out financials   Level of care: Med-Surg  Objective: Vitals:   11/16/21 0731 11/16/21 0841 11/16/21 1139 11/16/21 1235  BP: (!) 150/88  132/89   Pulse: 89 96 80 95  Resp: 20  20 (!) 24 (!) 30  Temp: (!) 97.5 F (36.4 C)  98 F (36.7 C)   TempSrc: Oral  Oral   SpO2: 95% 97% 97% 97%  Weight:      Height:        Intake/Output Summary (Last 24 hours) at 11/16/2021 1304 Last data filed at 11/16/2021 1139 Gross per 24 hour  Intake --  Output 1450 ml  Net -1450 ml   Wt Readings from Last 3 Encounters:  10/04/21 79.8 kg  08/02/21 83.9 kg  02/20/20 88.6 kg    Examination:  In bed, smiling while visiting friend   Data Reviewed: I have independently reviewed following labs and imaging studies   CBC Recent Labs  Lab 11/10/21 0536  WBC 9.2  HGB 12.9*  HCT 39.4  PLT 371  MCV 81.7  MCH 26.8  MCHC 32.7  RDW 15.3  LYMPHSABS 1.5  MONOABS 0.8  EOSABS 0.5  BASOSABS 0.1    Recent Labs  Lab  11/10/21 0536 11/14/21 0413  NA 139 139  K 3.9 4.2  CL 101 106  CO2 28 25  GLUCOSE 147* 114*  BUN 13 13  CREATININE 0.51* 0.54*  CALCIUM 9.8 9.2  AST 23  --   ALT 50*  --   ALKPHOS 43  --   BILITOT 0.5  --   ALBUMIN 3.1*  --   MG 1.9  --     ------------------------------------------------------------------------------------------------------------------ No results for input(s): "CHOL", "HDL", "LDLCALC", "TRIG", "CHOLHDL", "LDLDIRECT" in the last 72 hours.  Lab Results  Component Value Date   HGBA1C 8.3 (H) 08/03/2021   ------------------------------------------------------------------------------------------------------------------ No results for input(s): "TSH", "T4TOTAL", "T3FREE", "THYROIDAB" in the last 72 hours.  Invalid input(s): "FREET3"  Cardiac Enzymes No results for input(s): "CKMB", "TROPONINI", "MYOGLOBIN" in the last 168 hours.  Invalid input(s): "CK" ------------------------------------------------------------------------------------------------------------------    Component Value Date/Time   BNP 15.9 10/26/2021 0817    CBG: Recent Labs  Lab 11/15/21 1135 11/15/21 1637 11/15/21 2330 11/16/21 0538 11/16/21 1138  GLUCAP 197* 200* 224* 94 167*    No results found for this or any previous visit (from the past 240 hour(s)).    Radiology Studies: No results found.   [name] DO [facility]  Between 7 am - 7 pm I am available, please contact me via Amion (for emergencies) or Securechat (non urgent messages)  Between 7 pm - 7 am I am not available, please contact night coverage MD/APP via Amion

## 2021-11-17 LAB — COMPREHENSIVE METABOLIC PANEL
ALT: 62 U/L — ABNORMAL HIGH (ref 0–44)
AST: 33 U/L (ref 15–41)
Albumin: 3.2 g/dL — ABNORMAL LOW (ref 3.5–5.0)
Alkaline Phosphatase: 39 U/L (ref 38–126)
Anion gap: 8 (ref 5–15)
BUN: 11 mg/dL (ref 8–23)
CO2: 26 mmol/L (ref 22–32)
Calcium: 9.4 mg/dL (ref 8.9–10.3)
Chloride: 108 mmol/L (ref 98–111)
Creatinine, Ser: 0.5 mg/dL — ABNORMAL LOW (ref 0.61–1.24)
GFR, Estimated: >60 mL/min (ref >60)
Glucose, Bld: 97 mg/dL (ref 70–99)
Potassium: 3.6 mmol/L (ref 3.5–5.1)
Sodium: 142 mmol/L (ref 135–145)
Total Bilirubin: 0.6 mg/dL (ref 0.3–1.2)
Total Protein: 6.7 g/dL (ref 6.5–8.1)

## 2021-11-17 LAB — CBC WITH DIFFERENTIAL/PLATELET
Abs Immature Granulocytes: 0.02 10*3/uL (ref 0.00–0.07)
Basophils Absolute: 0 10*3/uL (ref 0.0–0.1)
Basophils Relative: 1 %
Eosinophils Absolute: 0.8 10*3/uL — ABNORMAL HIGH (ref 0.0–0.5)
Eosinophils Relative: 11 %
HCT: 42.3 % (ref 39.0–52.0)
Hemoglobin: 13.5 g/dL (ref 13.0–17.0)
Immature Granulocytes: 0 %
Lymphocytes Relative: 19 %
Lymphs Abs: 1.4 10*3/uL (ref 0.7–4.0)
MCH: 26.3 pg (ref 26.0–34.0)
MCHC: 31.9 g/dL (ref 30.0–36.0)
MCV: 82.3 fL (ref 80.0–100.0)
Monocytes Absolute: 0.6 10*3/uL (ref 0.1–1.0)
Monocytes Relative: 8 %
Neutro Abs: 4.4 10*3/uL (ref 1.7–7.7)
Neutrophils Relative %: 61 %
Platelets: 375 10*3/uL (ref 150–400)
RBC: 5.14 MIL/uL (ref 4.22–5.81)
RDW: 15.7 % — ABNORMAL HIGH (ref 11.5–15.5)
WBC: 7.4 10*3/uL (ref 4.0–10.5)
nRBC: 0 % (ref 0.0–0.2)

## 2021-11-17 LAB — MAGNESIUM: Magnesium: 1.6 mg/dL — ABNORMAL LOW (ref 1.7–2.4)

## 2021-11-17 LAB — GLUCOSE, CAPILLARY
Glucose-Capillary: 229 mg/dL — ABNORMAL HIGH (ref 70–99)
Glucose-Capillary: 260 mg/dL — ABNORMAL HIGH (ref 70–99)
Glucose-Capillary: 93 mg/dL (ref 70–99)

## 2021-11-17 MED ORDER — MAGNESIUM SULFATE 2 GM/50ML IV SOLN
2.0000 g | Freq: Once | INTRAVENOUS | Status: AC
  Administered 2021-11-17: 2 g via INTRAVENOUS
  Filled 2021-11-17: qty 50

## 2021-11-17 NOTE — Plan of Care (Signed)
Problem: Education: Goal: Knowledge of General Education information will improve Description: Including pain rating scale, medication(s)/side effects and non-pharmacologic comfort measures Outcome: Progressing   Problem: Health Behavior/Discharge Planning: Goal: Ability to manage health-related needs will improve Outcome: Progressing   Problem: Clinical Measurements: Goal: Ability to maintain clinical measurements within normal limits will improve Outcome: Progressing Goal: Will remain free from infection Outcome: Progressing Goal: Diagnostic test results will improve Outcome: Progressing Goal: Respiratory complications will improve Outcome: Progressing Goal: Cardiovascular complication will be avoided Outcome: Progressing   Problem: Activity: Goal: Risk for activity intolerance will decrease Outcome: Progressing   Problem: Nutrition: Goal: Adequate nutrition will be maintained Outcome: Progressing   Problem: Coping: Goal: Level of anxiety will decrease Outcome: Progressing   Problem: Elimination: Goal: Will not experience complications related to bowel motility Outcome: Progressing Goal: Will not experience complications related to urinary retention Outcome: Progressing   Problem: Pain Managment: Goal: General experience of comfort will improve Outcome: Progressing   Problem: Safety: Goal: Ability to remain free from injury will improve Outcome: Progressing   Problem: Skin Integrity: Goal: Risk for impaired skin integrity will decrease Outcome: Progressing   Problem: Education: Goal: Ability to describe self-care measures that may prevent or decrease complications (Diabetes Survival Skills Education) will improve Outcome: Progressing Goal: Individualized Educational Video(s) Outcome: Progressing   Problem: Coping: Goal: Ability to adjust to condition or change in health will improve Outcome: Progressing   Problem: Fluid Volume: Goal: Ability to  maintain a balanced intake and output will improve Outcome: Progressing   Problem: Health Behavior/Discharge Planning: Goal: Ability to identify and utilize available resources and services will improve Outcome: Progressing Goal: Ability to manage health-related needs will improve Outcome: Progressing   Problem: Metabolic: Goal: Ability to maintain appropriate glucose levels will improve Outcome: Progressing   Problem: Nutritional: Goal: Maintenance of adequate nutrition will improve Outcome: Progressing Goal: Progress toward achieving an optimal weight will improve Outcome: Progressing   Problem: Skin Integrity: Goal: Risk for impaired skin integrity will decrease Outcome: Progressing   Problem: Tissue Perfusion: Goal: Adequacy of tissue perfusion will improve Outcome: Progressing   Problem: Education: Goal: Knowledge of disease or condition will improve Outcome: Progressing Goal: Knowledge of secondary prevention will improve (SELECT ALL) Outcome: Progressing Goal: Knowledge of patient specific risk factors will improve (INDIVIDUALIZE FOR PATIENT) Outcome: Progressing Goal: Individualized Educational Video(s) Outcome: Progressing   Problem: Coping: Goal: Will verbalize positive feelings about self Outcome: Progressing Goal: Will identify appropriate support needs Outcome: Progressing   Problem: Health Behavior/Discharge Planning: Goal: Ability to manage health-related needs will improve Outcome: Progressing   Problem: Self-Care: Goal: Ability to participate in self-care as condition permits will improve Outcome: Progressing Goal: Verbalization of feelings and concerns over difficulty with self-care will improve Outcome: Progressing Goal: Ability to communicate needs accurately will improve Outcome: Progressing   Problem: Nutrition: Goal: Risk of aspiration will decrease Outcome: Progressing Goal: Dietary intake will improve Outcome: Progressing    Problem: Ischemic Stroke/TIA Tissue Perfusion: Goal: Complications of ischemic stroke/TIA will be minimized Outcome: Progressing   Problem: Education: Goal: Understanding of CV disease, CV risk reduction, and recovery process will improve Outcome: Progressing Goal: Individualized Educational Video(s) Outcome: Progressing   Problem: Activity: Goal: Ability to return to baseline activity level will improve Outcome: Progressing   Problem: Cardiovascular: Goal: Ability to achieve and maintain adequate cardiovascular perfusion will improve Outcome: Progressing Goal: Vascular access site(s) Level 0-1 will be maintained Outcome: Progressing   Problem: Health Behavior/Discharge Planning: Goal: Ability to safely manage  health-related needs after discharge will improve Outcome: Progressing   Problem: Activity: Goal: Ability to tolerate increased activity will improve Outcome: Progressing

## 2021-11-17 NOTE — TOC Progression Note (Signed)
Transition of Care [facility]) - Progression Note    Patient Details  Name: Joshua Donovan MRN: [id number] Date of Birth: 11/28/1955  Transition of Care [facility]) CM/SW [place], [place] Phone Number: 11/17/2021, 10:20 AM  Clinical Narrative:     Called patient's daughter,[name]- left voice message   Expected Discharge Plan: [place] Barriers to Discharge: SNF Pending bed offer  Expected Discharge Plan and Services Expected Discharge Plan: [place]   Discharge Planning Services: CM Consult Post Acute Care Choice: Long Term Acute Care (LTAC) Living arrangements for the past 2 months: Single Family Home                                       Social Determinants of Health (SDOH) Interventions    Readmission Risk Interventions     No data to display

## 2021-11-17 NOTE — TOC Progression Note (Signed)
Transition of Care [facility]) - Progression Note    Patient Details  Name: Joshua Donovan MRN: [id number] Date of Birth: May 22, 1955  Transition of Care [facility]) CM/SW [place], [place] Phone Number: 11/17/2021, 4:41 PM  Clinical Narrative:     CSW spoke with patient's daughter Joelene Millin- she reports she missed call from SNF/Admissions but return call and has not received a call back.  CSW called SNF/Admissions- received no answer   TOC will continue to follow and assist with discharge planning.  Thurmond Butts, MSW, LCSW Clinical Social Worker    Expected Discharge Plan: Skilled Nursing Facility Barriers to Discharge: SNF Pending bed offer  Expected Discharge Plan and Services Expected Discharge Plan: [place]   Discharge Planning Services: CM Consult Post Acute Care Choice: Long Term Acute Care (LTAC) Living arrangements for the past 2 months: Single Family Home                                       Social Determinants of Health (SDOH) Interventions    Readmission Risk Interventions     No data to display

## 2021-11-17 NOTE — Progress Notes (Signed)
Speech Language Pathology Treatment: [name] Speaking valve  Patient Details Name: Joshua Donovan MRN: [id number] DOB: 09/26/1955 Today's Date: 11/17/2021 Time: 5176-1607 SLP Time Calculation (min) (ACUTE ONLY): 45 min  Assessment / Plan / Recommendation Clinical Impression  Pt named 15 [employer] teams (looking at their logo only) with adequate articulation and vocal quality with a verbal cue needed in 1/2 of attempts to improve volume. Pt attempted open ended conversation x2, once with no listener comprehension and once with eventual listener comprehension with max assist from SLP and daughter to over articulate, repeat, and give initial letter to narrow down options. Pt making progress will continue efforts.    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 He  presented 08/02/21 with R foot pain and R-sided weakness. Transferred to [facility] 7/25. 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 S/p stent placement to the R vertebrobasilar junction 7/26, failed extubation. Trach and PEG placed 7/28. PMH: DM, GERD, TBI with prior ICH, HLD, HTN      SLP Plan  Continue with current plan of care      Recommendations for follow up therapy are one component of a multi-disciplinary discharge planning process, led by the attending physician.  Recommendations may be updated based on patient status, additional functional criteria and insurance authorization.    Recommendations         Patient may use Passy-Muir Speech Valve: During all waking hours (remove during sleep) PMSV Supervision: Intermittent MD: Please consider changing trach tube  to : Smaller size         Oral Care Recommendations: Oral care QID Follow Up Recommendations: Skilled nursing-short term rehab (<3 hours/day) Assistance recommended at discharge: Frequent or constant Supervision/Assistance Plan: Continue with current plan of care           Naisha Wisdom, [name]  11/17/2021, 2:37 PM

## 2021-11-17 NOTE — Progress Notes (Signed)
PROGRESS NOTE  Joshua Donovan EXH:[id number] DOB: Nov 24, 1955 DOA: 08/03/2021 PCP: [facility] [place]   LOS: 106 days   Brief Narrative / Interim history: 66 year old with a history of ICH, DM 2, HTN, and HLD who presented to [facility] 7/24 with right hand numbness and weakness and was diagnosed with a small brainstem stroke.  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  He required intubation and was transferred to [facility] for an interventional radiology intervention.  He ultimately required tracheostomy and PEG tube placement, and has subsequently suffered a prolonged hospital stay.   Significant events: 7/24 presented to [facility], ventral medulla CVA 7/25 tx to [facility], treated with Cleviprex 7/26 cerebral angiogram with stent placement to right vertebro-basilar junction, failed extubation, MRI brain> mild extension of stroke, now involving bilateral medial medullary, patent basilar artery and R VBJ stent  7/28 bedside tracheostomy by PCCM and PEG tube placement by General Surgery 7/31 Bleeding around trach site, bright red blood.  Underwent bronchoscopy, Trach Removed, patient orally reintubated, stoma packed. 8/2 ENT took patient to OR for trach redo 8/9 transferred to medical floor on tracheostomy 8/12 vomiting after bolus feeding with worsening hypoxia 8/13 copious bleeding from the heparin injection site controlled with pressure dressing 8/14 bleeding has resolved.  Trach secretions blood-tinged. 8/15 insurance denied LTAC appeal.  CSW working on SNF. 8/28: peer to peer for LTAC looks like this was denied 8/29: V. tach runs - increased metoprolol  9/2: Slightly increased work of [place] [facility] for chest x-ray showed no interval changes 9/5:  Ancef started 2/2 secretions --no wbc or fever 9/12 completed abx course  10/28 completed another antibiotic course Currently Awaiting placement  - [name]- s/p trach  change per RT on 11/3  Subjective / 24h Interval events: Alert  Assesement and Plan: Principal Problem:   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[place]) Active Problems:   Acute respiratory failure with hypoxia ([place])   Hypertension   Uncontrolled type 2 diabetes mellitus with hyperglycemia ([place])   Mixed hyperlipidemia   Alcohol abuse   NSVT (nonsustained ventricular tachycardia) (HCC)   Dysphagia   Tracheostomy in place ([place])   HAP (hospital-acquired pneumonia)   Pressure injury of skin   Principal problem 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  Continue supportive care.  Continue PT OT as tolerated, await SNF bed.  -PER MD [name] on 10/20/2021 duration of dual antiplatelet therapy is 6 months starting from 08/04/2021 thereafter aspirin alone.   Active problems Acute respiratory failure with hypoxia (HCC) along with aspiration pneumonia-Patient required ICU admission and mechanical ventilation after admission secondary to worsening mentation. Concern for possible aspiration pneumonitis. Patient unable to extubate. Tracheostomy placed on 7/31 and revised by ENT on 08/11/21 secondary to bleeding. Currently stable. He has finished antibiotic treatment for his infections.  Most recently grew Serratia / enterobacter / staph aureus, and for next antibiotics on 10/28.  Now monitor off antibiotics.  Afebrile -using PRN lasix   Alcohol abuse - Initially managed on CIWA.  No signs of DTs now, Continue thiamine, multivitamin and folic acid.   Mixed hyperlipidemia - Continue Lipitor.   Hypertension - Continue antihypertensives as below   NSVT (nonsustained ventricular tachycardia) (HCC) EF 60% -on beta-blocker.  Cardiology consulted one-point but not actively following.  Dysphagia - Patient  is s/p PEG tube and is on tube feeds via PEG. Continue tube feeds and free water.   Tracheostomy complication (HCC)-resolved as of 08/25/2021 - In setting of DAPT. ENT consulted for revision. Bleeding resolved.   Hypokalemia and hypomagnesemia -stopped HCTZ which likely is worsening his hypokalemia, still on scheduled potassium supplementation monitor potassium levels closely.  Potassium has remained stable   Uncontrolled type 2 diabetes mellitus with hyperglycemia (HCC) - Most recent hemoglobin A1C of 8.3%. uncontrolled with hyper- and hypoglycemia.  Levemir along with NovoLog every 4 hours.  Continue to monitor CBGs  CBG (last 3)  Recent Labs    11/16/21 1807 11/16/21 2324 11/17/21 0552  GLUCAP 166* 226* 93    Scheduled Meds:  amLODipine  10 mg Per Tube Daily   aspirin  81 mg Per Tube Daily   atorvastatin  80 mg Per Tube Daily   carvedilol  37.5 mg Per Tube BID WC   diclofenac Sodium  2 g Topical QID   famotidine  20 mg Per Tube Daily   feeding supplement ([name])  237 mL Per Tube 6 X Daily   feeding supplement (PROSource TF20)  60 mL Per Tube Daily   folic acid  1 mg Per Tube Daily   free water  100 mL Per Tube Q4H   guaiFENesin  10 mL Per Tube Q4H   insulin aspart  0-9 Units Subcutaneous Q6H   insulin detemir  30 Units Subcutaneous BID   multivitamin with minerals  1 tablet Per Tube Daily   mouth rinse  15 mL Mouth Rinse 4 times per day   potassium chloride  40 mEq Per Tube BID   scopolamine  1 patch Transdermal Q72H   thiamine  100 mg Per Tube Daily   ticagrelor  90 mg Per Tube BID   Continuous Infusions: PRN Meds:.[DISCONTINUED] acetaminophen **OR** acetaminophen (TYLENOL) oral liquid 160 mg/5 mL **OR** [DISCONTINUED] acetaminophen, bisacodyl, hydrALAZINE, labetalol, liver oil-zinc oxide, mouth rinse, polyethylene glycol, sodium chloride  Current Outpatient Medications  Medication Instructions   ACCU-CHEK FASTCLIX LANCETS MISC Use as directed twice daily  dia E11.65    atorvastatin (LIPITOR) 20 mg, Oral, Daily   atorvastatin (LIPITOR) 80 mg, Oral, Daily   Blood Glucose Monitoring Suppl (ACCU-CHEK AVIVA PLUS) w/Device KIT Use as directed   cetirizine (ZYRTEC) 10 mg, Oral, Daily   glucose blood (ACCU-CHEK AVIVA PLUS) test strip Use two times daily to check blood sugar   hydrochlorothiazide (MICROZIDE) 12.5 mg, Oral, Daily   lisinopril (ZESTRIL) 40 mg, Oral, Daily   metFORMIN (GLUCOPHAGE) 1,000 mg, Oral, 2 times daily with meals   metoprolol tartrate (LOPRESSOR) 25 mg, Oral, Daily    Diet Orders (From admission, onward)     Start     Ordered   10/20/21 0819  Diet NPO time specified  Diet effective now       Comments: All medications and diet via feeding tube   10/20/21 0818            DVT prophylaxis: Place TED hose Start: 08/22/21 1546 SCD's Start: 08/03/21 2311   Lab Results  Component Value Date   PLT 375 11/17/2021      Code Status: Full Code  Family Communication: no family at bedside   Status is: Inpatient  Remains inpatient appropriate because: awaiting placement   Level of care: Med-Surg  Objective: Vitals:   11/17/21 0255 11/17/21 0353 11/17/21 0709 11/17/21 0856  BP: (!) 161/88  Pulse: 84 84  79  Resp: 19 (!) 27 (!) 22 (!) 23  Temp: 98.2 F (36.8 C)     TempSrc: Oral     SpO2: 97% 98%  99%  Weight:      Height:        Intake/Output Summary (Last 24 hours) at 11/17/2021 1128 Last data filed at 11/16/2021 2322 Gross per 24 hour  Intake --  Output 1075 ml  Net -1075 ml   Wt Readings from Last 3 Encounters:  10/04/21 79.8 kg  08/02/21 83.9 kg  02/20/20 88.6 kg    Examination:  Constitutional: NAD Respiratory: cta Cardiovascular: rrr  Data Reviewed: I have independently reviewed following labs and imaging studies   CBC Recent Labs  Lab 11/17/21 0600  WBC 7.4  HGB 13.5  HCT 42.3  PLT 375  MCV 82.3  MCH 26.3  MCHC 31.9  RDW 15.7*  LYMPHSABS 1.4  MONOABS 0.6  EOSABS 0.8*   BASOSABS 0.0    Recent Labs  Lab 11/14/21 0413 11/17/21 0600  NA 139 142  K 4.2 3.6  CL 106 108  CO2 25 26  GLUCOSE 114* 97  BUN 13 11  CREATININE 0.54* 0.50*  CALCIUM 9.2 9.4  AST  --  33  ALT  --  62*  ALKPHOS  --  39  BILITOT  --  0.6  ALBUMIN  --  3.2*  MG  --  1.6*    ------------------------------------------------------------------------------------------------------------------ No results for input(s): "CHOL", "HDL", "LDLCALC", "TRIG", "CHOLHDL", "LDLDIRECT" in the last 72 hours.  Lab Results  Component Value Date   HGBA1C 8.3 (H) 08/03/2021   ------------------------------------------------------------------------------------------------------------------ No results for input(s): "TSH", "T4TOTAL", "T3FREE", "THYROIDAB" in the last 72 hours.  Invalid input(s): "FREET3"  Cardiac Enzymes No results for input(s): "CKMB", "TROPONINI", "MYOGLOBIN" in the last 168 hours.  Invalid input(s): "CK" ------------------------------------------------------------------------------------------------------------------    Component Value Date/Time   BNP 15.9 10/26/2021 0817    CBG: Recent Labs  Lab 11/16/21 0538 11/16/21 1138 11/16/21 1807 11/16/21 2324 11/17/21 0552  GLUCAP 94 167* 166* 226* 93    No results found for this or any previous visit (from the past 240 hour(s)).   Radiology Studies: No results found.   Marzetta Board, MD, PhD [facility]  Between 7 am - 7 pm I am available, please contact me via Amion (for emergencies) or Securechat (non urgent messages)  Between 7 pm - 7 am I am not available, please contact night coverage MD/APP via Amion

## 2021-11-18 LAB — GLUCOSE, CAPILLARY
Glucose-Capillary: 134 mg/dL — ABNORMAL HIGH (ref 70–99)
Glucose-Capillary: 178 mg/dL — ABNORMAL HIGH (ref 70–99)
Glucose-Capillary: 240 mg/dL — ABNORMAL HIGH (ref 70–99)
Glucose-Capillary: 277 mg/dL — ABNORMAL HIGH (ref 70–99)
Glucose-Capillary: 296 mg/dL — ABNORMAL HIGH (ref 70–99)

## 2021-11-18 NOTE — Plan of Care (Signed)
Problem: Education: Goal: Knowledge of General Education information will improve Description: Including pain rating scale, medication(s)/side effects and non-pharmacologic comfort measures Outcome: Progressing   Problem: Health Behavior/Discharge Planning: Goal: Ability to manage health-related needs will improve Outcome: Progressing   Problem: Clinical Measurements: Goal: Ability to maintain clinical measurements within normal limits will improve Outcome: Progressing Goal: Will remain free from infection Outcome: Progressing Goal: Diagnostic test results will improve Outcome: Progressing Goal: Respiratory complications will improve Outcome: Progressing Goal: Cardiovascular complication will be avoided Outcome: Progressing   Problem: Activity: Goal: Risk for activity intolerance will decrease Outcome: Progressing   Problem: Nutrition: Goal: Adequate nutrition will be maintained Outcome: Progressing   Problem: Coping: Goal: Level of anxiety will decrease Outcome: Progressing   Problem: Elimination: Goal: Will not experience complications related to bowel motility Outcome: Progressing Goal: Will not experience complications related to urinary retention Outcome: Progressing   Problem: Pain Managment: Goal: General experience of comfort will improve Outcome: Progressing   Problem: Safety: Goal: Ability to remain free from injury will improve Outcome: Progressing   Problem: Skin Integrity: Goal: Risk for impaired skin integrity will decrease Outcome: Progressing   Problem: Education: Goal: Ability to describe self-care measures that may prevent or decrease complications (Diabetes Survival Skills Education) will improve Outcome: Progressing Goal: Individualized Educational Video(s) Outcome: Progressing   Problem: Coping: Goal: Ability to adjust to condition or change in health will improve Outcome: Progressing   Problem: Fluid Volume: Goal: Ability to  maintain a balanced intake and output will improve Outcome: Progressing   Problem: Health Behavior/Discharge Planning: Goal: Ability to identify and utilize available resources and services will improve Outcome: Progressing Goal: Ability to manage health-related needs will improve Outcome: Progressing   Problem: Metabolic: Goal: Ability to maintain appropriate glucose levels will improve Outcome: Progressing   Problem: Nutritional: Goal: Maintenance of adequate nutrition will improve Outcome: Progressing Goal: Progress toward achieving an optimal weight will improve Outcome: Progressing   Problem: Skin Integrity: Goal: Risk for impaired skin integrity will decrease Outcome: Progressing   Problem: Tissue Perfusion: Goal: Adequacy of tissue perfusion will improve Outcome: Progressing   Problem: Education: Goal: Knowledge of disease or condition will improve Outcome: Progressing Goal: Knowledge of secondary prevention will improve (SELECT ALL) Outcome: Progressing Goal: Knowledge of patient specific risk factors will improve (INDIVIDUALIZE FOR PATIENT) Outcome: Progressing Goal: Individualized Educational Video(s) Outcome: Progressing   Problem: Coping: Goal: Will verbalize positive feelings about self Outcome: Progressing Goal: Will identify appropriate support needs Outcome: Progressing   Problem: Health Behavior/Discharge Planning: Goal: Ability to manage health-related needs will improve Outcome: Progressing   Problem: Self-Care: Goal: Ability to participate in self-care as condition permits will improve Outcome: Progressing Goal: Verbalization of feelings and concerns over difficulty with self-care will improve Outcome: Progressing Goal: Ability to communicate needs accurately will improve Outcome: Progressing   Problem: Nutrition: Goal: Risk of aspiration will decrease Outcome: Progressing Goal: Dietary intake will improve Outcome: Progressing    Problem: Ischemic Stroke/TIA Tissue Perfusion: Goal: Complications of ischemic stroke/TIA will be minimized Outcome: Progressing   Problem: Education: Goal: Understanding of CV disease, CV risk reduction, and recovery process will improve Outcome: Progressing Goal: Individualized Educational Video(s) Outcome: Progressing   Problem: Activity: Goal: Ability to return to baseline activity level will improve Outcome: Progressing   Problem: Cardiovascular: Goal: Ability to achieve and maintain adequate cardiovascular perfusion will improve Outcome: Progressing Goal: Vascular access site(s) Level 0-1 will be maintained Outcome: Progressing   Problem: Health Behavior/Discharge Planning: Goal: Ability to safely manage  health-related needs after discharge will improve Outcome: Progressing   Problem: Activity: Goal: Ability to tolerate increased activity will improve Outcome: Progressing

## 2021-11-18 NOTE — Progress Notes (Signed)
Physical Therapy Treatment Patient Details Name: Joshua Donovan MRN: [id number] DOB: [birth year] Today's Date: 11/18/2021   History of Present Illness Pt is a 66 y.o. male who presented 08/02/21 with R foot pain and R-sided weakness. Transferred to [facility] 7/25. 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 S/p stent placement to the R vertebrobasilar junction 7/26, failed extubation. Trach and PEG placed 7/28. S/p bronchoscopic evaluation, oral reintubation and tracheostomy removal with stoma packing 7/31 due to trach site bleeding. S/p trach revision 8/2. PMH: DM, GERD, TBI with prior ICH, HLD, HTN    PT Comments    Pt eager to get up OOB and appears to benefit well from a stretch prior to mobility.  Emphasis on transition to sitting EOB.  Worked on cervical extension and rotation, propped on UE's before sit to stand and transfer to the chair, pt's tone not assist with standing today.    Recommendations for follow up therapy are one component of a multi-disciplinary discharge planning process, led by the attending physician.  Recommendations may be updated based on patient status, additional functional criteria and insurance authorization.  Follow Up Recommendations  Skilled nursing-short term rehab (<3 hours/day) Can patient physically be transported by private vehicle: No   Assistance Recommended at Discharge Frequent or constant Supervision/Assistance  Patient can return home with the following Assistance with cooking/housework;Two people to help with walking and/or transfers;Two people to help with bathing/dressing/bathroom;Assistance  with feeding;Direct supervision/assist for medications management;Direct supervision/assist for financial management;Assist for transportation;Help with stairs or ramp for entrance   Equipment Recommendations  Other (comment)    Recommendations for Other Services       Precautions / Restrictions Precautions Precautions: Fall Precaution Comments: trach collar, PEG, SBP < 180,  PMV on during waking hours if O2 sats are good Restrictions Weight Bearing Restrictions: No     Mobility  Bed Mobility Overal bed mobility: Needs Assistance             General bed mobility comments: total A for all mobility    Transfers Overall transfer level: Needs assistance Equipment used: None Transfers: Bed to chair/wheelchair/BSC Sit to Stand: Total assist, +2 safety/equipment Stand pivot transfers: Total assist, +2 physical assistance         General transfer comment: lift under pt in wheelchair    Ambulation/Gait                   Stairs             Wheelchair Mobility    Modified Rankin (Stroke Patients Only)       Balance Overall balance assessment: Needs assistance Sitting-balance support: Feet supported, Bilateral upper extremity supported Sitting balance-Leahy Scale: Zero Sitting balance - Comments: Sat EOB ~12 minute working on sitting balance with eye and  head movements, pt is min A to initiate from neck flexion into neutral with increased trunk extension as he nears more neutral with his neck/head at midline. Once there he can relax out of the extensor tone. He can turn his head left and right slightly (1/5)with movement of eyes in the direction first.     Standing balance-Leahy Scale: Zero Standing balance comment: stand during transfer (  tone usually kicks in to help stand, but did not today                            Cognition Arousal/Alertness: Awake/alert Behavior During Therapy: WFL for tasks assessed/performed Overall Cognitive  Status: No family/caregiver present to determine baseline cognitive functioning                                 General Comments: does not remember to speak only one word at a time despite multiple sessions to work with SLP.        Exercises Other Exercises Other Exercises: PROM to bil UEs and LE's    General Comments        Pertinent Vitals/Pain Pain Assessment Pain Assessment: Faces Faces Pain Scale: Hurts little more Pain Location: legs and left fingers with PROM Pain Descriptors / Indicators: Grimacing Pain Intervention(s): Limited activity within patient's tolerance    Home Living                          Prior Function            PT Goals (current goals can now be found in the care plan section) Acute Rehab PT Goals PT Goal Formulation: With patient/family Time For Goal Achievement: 11/29/21 Potential to Achieve Goals: Fair Progress towards PT goals: Progressing toward goals    Frequency    Min 2X/week      PT Plan Current plan remains appropriate    Co-evaluation   Reason for Co-Treatment: Complexity of the patient's impairments (multi-system involvement);For patient/therapist safety          AM-PAC PT "6 Clicks" Mobility   Outcome Measure  Help needed turning from your back to your side while in a flat bed without using bedrails?: Total Help needed moving from lying on your back to sitting on the side of a flat bed without using bedrails?: Total Help needed moving to and from a bed to a chair (including a wheelchair)?: Total Help needed standing up from a chair using your arms (e.g., wheelchair or bedside chair)?: Total Help needed to walk in hospital room?: Total Help needed climbing 3-5 steps with a railing? : Total 6 Click Score: 6    End of Session Equipment Utilized During Treatment: Oxygen Activity Tolerance: Patient tolerated treatment well Patient left: in chair;with call bell/phone within reach;with  nursing/sitter in room Nurse Communication: Need for lift equipment;Mobility status PT Visit Diagnosis: Muscle weakness (generalized) (M62.81);Other symptoms and signs involving the nervous system (R29.898);Other abnormalities of gait and mobility (R26.89);Other (comment) Hemiplegia - Right/Left: Right Hemiplegia - dominant/non-dominant: Dominant Hemiplegia - caused by: Cerebral infarction     Time: 5701-7793 PT Time Calculation (min) (ACUTE ONLY): 37 min  Charges:  $Therapeutic Activity: 8-22 mins                     11/18/2021  Joshua Carne., PT Acute Rehabilitation Services [phone]  (office)   Joshua Donovan 11/18/2021, 5:25 PM

## 2021-11-18 NOTE — Progress Notes (Signed)
Occupational Therapy Treatment Patient Details Name: Joshua Donovan MRN: [id number] DOB: 1955/06/12 Today's Date: 11/18/2021   History of present illness Pt is a 66 y.o. male who presented 08/02/21 with R foot pain and R-sided weakness. Transferred to [facility] 7/25. 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 S/p stent placement to the R vertebrobasilar junction 7/26, failed extubation. Trach and PEG placed 7/28. S/p bronchoscopic evaluation, oral reintubation and tracheostomy removal with stoma packing 7/31 due to trach site bleeding. S/p trach revision 8/2. PMH: DM, GERD, TBI with prior ICH, HLD, HTN   OT comments  This 66 yo male seen today in conjunction with PT to see how he is doing and to update goals. Pt can raise his head from flexion to neutral with min A to initiate movement, once in neutral he can slightly move his head left and right moving his eyes first. He has limited PROM in Bil hands and wrists, with RUE digits in extension and LUE digits 1-2 in extension and 3-5 flexed at PIPs. He has a very positive attitude and is willing to try anything asked of him. He will continue to benefit from acute OT with follow up at SNF.   Recommendations for follow up therapy are one component of a multi-disciplinary discharge planning process, led by the attending physician.  Recommendations may be updated based on patient status, additional functional criteria and insurance authorization.    Follow Up Recommendations  Skilled nursing-short term rehab (<3 hours/day)    Assistance Recommended at Discharge Frequent or constant  Supervision/Assistance  Patient can return home with the following  Two people to help with walking and/or transfers;Two people to help with bathing/dressing/bathroom;Assistance with cooking/housework;Assistance with feeding;Direct supervision/assist for medications management;Direct supervision/assist for financial management;Assist for transportation;Help with stairs or ramp for entrance   Equipment Recommendations  Other (comment) (TBD Next venue)       Precautions / Restrictions Precautions Precautions: Fall Precaution Comments: trach collar, PEG, SBP < 180,  PMV on during waking hours if O2 sats are good Restrictions Weight Bearing Restrictions: No       Mobility Bed Mobility Overal bed mobility: Needs Assistance             General bed mobility comments: total A for all mobility    Transfers Overall transfer level: Needs assistance Equipment used: None Transfers: Bed to chair/wheelchair/BSC Sit to Stand: Total assist Stand pivot transfers: Total assist, +2 physical assistance         General transfer comment: lift under pt in wheelchair     Balance Overall balance assessment: Needs assistance Sitting-balance support: Feet supported, Bilateral upper extremity supported Sitting balance-Leahy Scale: Zero Sitting balance - Comments: Sat EOB ~12 minute working on sitting balance with eye and  head movements, pt is min A to initiate from neck flexion into neutral with increased trunk extension as he nears more neutral with his neck/head at midline. Once there he can relax out of the extensor tone. He can turn his head left and right slightly (1/5)with movement of eyes in the direction first.     Standing balance-Leahy Scale: Zero Standing balance comment: stand during transfer (tone usually kicks in to help stand,  but did not today                           ADL either performed or assessed with clinical judgement   ADL Overall ADL's : Needs  assistance/impaired Eating/Feeding: NPO                                     General ADL Comments: total A    Extremity/Trunk Assessment Upper Extremity Assessment Upper Extremity Assessment: RUE deficits/detail;LUE deficits/detail RUE Deficits / Details: unable to obtain full PROM of hand and wirst, pt grimacing with PROM digits for flexion, edema and fingers in extension which does it make it easier for self care RUE Coordination: decreased fine motor;decreased gross motor LUE Deficits / Details: unable to obtain full PROM of hand and wirst, pt grimancing with pain. Increased edema. digits 3-5 with flexion [employer] at [place]: decreased fine motor;decreased gross motor            Vision Baseline Vision/History: 0 No visual deficits Ability to See in Adequate Light: 0 Adequate Patient Visual Report: No change from baseline            Cognition Arousal/Alertness: Awake/alert Behavior During Therapy: WFL for tasks assessed/performed Overall Cognitive Status: Impaired/Different from baseline                                 General Comments: does not remember to speak only one word at a time despite multiple sessions to work with SLP.                   Pertinent Vitals/ Pain       Pain Assessment Pain Assessment: Faces Faces Pain Scale: Hurts little more Pain Location: legs and left fingers with PROM Pain Descriptors / Indicators: Grimacing Pain Intervention(s): Limited activity within patient's tolerance, Monitored during session, Repositioned         Frequency  Min 2X/week        Progress Toward Goals  OT Goals(current goals can now be found in the care plan section)  Progress towards OT goals: Progressing toward goals (very slowly)  Acute Rehab OT Goals Patient Stated Goal: wanting to get up in [facility] OT Goal Formulation: With patient Time For Goal Achievement: 12/03/21 Potential to Achieve Goals: [place]  Discharge plan remains appropriate    Co-evaluation    PT/OT/SLP Co-Evaluation/Treatment: Yes Reason for Co-Treatment: Complexity of the patient's impairments (multi-system involvement);For patient/therapist safety          AM-PAC OT "6 Clicks" Daily Activity     Outcome Measure   Help from another person eating meals?: Total Help from another person taking care of personal grooming?: Total Help from another person toileting, which includes using toliet, bedpan, or urinal?: Total Help from another person bathing (including washing, rinsing, drying)?: Total Help from another person to put on and taking off regular upper body clothing?: Total Help from another person to put on and taking off regular lower body clothing?: Total 6 Click Score: 6    End of Session Equipment Utilized During Treatment: Oxygen (21% 5liters)  OT Visit Diagnosis: Other symptoms and signs involving the nervous system (R29.898);Muscle weakness (generalized) (M62.81);Feeding difficulties (R63.3);Other abnormalities of gait and mobility (R26.89);Unsteadiness on feet (R26.81);Other (comment)   Activity Tolerance Patient tolerated treatment  well   Patient Left  (in tilt in space wheelchair tilted back ~30 degrees)   Nurse Communication Mobility status;Need for lift equipment (maxi move)        Time: 1720-9106 OT Time Calculation (min): 37 min  Charges: OT General Charges $OT Visit: 1 Visit OT Treatments $Therapeutic Activity: 8-22 mins [place], OTR/L Acute Rehab Services [facility] [phone] Office [phone]    Almon Register 11/18/2021, 4:00 PM

## 2021-11-18 NOTE — Progress Notes (Signed)
PROGRESS NOTE  Joshua Donovan QQV:[id number] DOB: 10-02-1955 DOA: 08/03/2021 [facility]CP: [facility] [place]   LOS: 107 days   Brief Narrative / Interim history: 66 year old with a history of ICH, DM 2, HTN, and HLD who presented to [facility] 7/24 with right hand numbness and weakness and was diagnosed with a small brainstem stroke.  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  He required intubation and was transferred to [facility] for an interventional radiology intervention.  He ultimately required tracheostomy and PEG tube placement, and has subsequently suffered a prolonged hospital stay.   Significant events: 7/24 presented to [facility], ventral medulla CVA 7/25 tx to [facility], treated with Cleviprex 7/26 cerebral angiogram with stent placement to right vertebro-basilar junction, failed extubation, MRI brain> mild extension of stroke, now involving bilateral medial medullary, patent basilar artery and R VBJ stent  7/28 bedside tracheostomy by PCCM and PEG tube placement by General Surgery 7/31 Bleeding around trach site, bright red blood.  Underwent bronchoscopy, Trach Removed, patient orally reintubated, stoma packed. 8/2 ENT took patient to OR for trach redo 8/9 transferred to medical floor on tracheostomy 8/12 vomiting after bolus feeding with worsening hypoxia 8/13 copious bleeding from the heparin injection site controlled with pressure dressing 8/14 bleeding has resolved.  Trach secretions blood-tinged. 8/15 insurance denied LTAC appeal.  CSW working on SNF. 8/28: peer to peer for LTAC looks like this was denied 8/29: V. tach runs - increased metoprolol  9/2: Slightly increased work of [address] for chest x-ray showed no interval changes 9/5:  Ancef started 2/2 secretions --no wbc or fever 9/12 completed abx course  10/28 completed another antibiotic course Currently Awaiting placement  - [name]- s/p trach  change per RT on 11/3  Subjective / 24h Interval events: Alert, comfortable.  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  Continue supportive care.  Continue PT OT as tolerated, await SNF bed.  -PER MD [name] on 10/20/2021 duration of dual antiplatelet therapy is 6 months starting from 08/04/2021 thereafter aspirin alone.   Active problems Acute respiratory failure with hypoxia (HCC) along with aspiration pneumonia-Patient required ICU admission and mechanical ventilation after admission secondary to worsening mentation. Concern for possible aspiration pneumonitis. Patient unable to extubate. Tracheostomy placed on 7/31 and revised by ENT on 08/11/21 secondary to bleeding. Currently stable. He has finished antibiotic treatment for his infections.  Most recently grew Serratia / enterobacter / staph aureus, and for next antibiotics on 10/28.  Now monitor off antibiotics.  Afebrile -using PRN lasix   Alcohol abuse - Initially managed on CIWA.  No signs of DTs now, Continue thiamine, multivitamin and folic acid.   Mixed hyperlipidemia - Continue Lipitor.   Hypertension - Continue antihypertensives as below   NSVT (nonsustained ventricular tachycardia) (HCC) EF 60% -on beta-blocker.  Cardiology consulted one-point but not actively  following.    Dysphagia - Patient is s/p PEG tube and is on tube feeds via PEG. Continue tube feeds and free water.   Tracheostomy complication (HCC)-resolved as of 08/25/2021 - In setting of DAPT. ENT consulted for revision. Bleeding resolved.   Hypokalemia and hypomagnesemia -stopped HCTZ which likely is worsening his hypokalemia, still on scheduled potassium supplementation monitor potassium levels closely.  Potassium has remained stable   Uncontrolled type 2 diabetes mellitus with hyperglycemia (HCC) - Most recent hemoglobin A1C of 8.3%. uncontrolled with hyper- and hypoglycemia.  Levemir along with NovoLog every 4 hours.  Continue to monitor CBGs  CBG (last 3)  Recent Labs    11/17/21 1136 11/17/21 2351 11/18/21 0552  GLUCAP 260* 229* 134*     Scheduled Meds:  amLODipine  10 mg Per Tube Daily   aspirin  81 mg Per Tube Daily   atorvastatin  80 mg Per Tube Daily   carvedilol  37.5 mg Per Tube BID WC   diclofenac Sodium  2 g Topical QID   famotidine  20 mg Per Tube Daily   feeding supplement ([name])  237 mL Per Tube 6 X Daily   feeding supplement (PROSource TF20)  60 mL Per Tube Daily   folic acid  1 mg Per Tube Daily   free water  100 mL Per Tube Q4H   guaiFENesin  10 mL Per Tube Q4H   insulin aspart  0-9 Units Subcutaneous Q6H   insulin detemir  30 Units Subcutaneous BID   multivitamin with minerals  1 tablet Per Tube Daily   mouth rinse  15 mL Mouth Rinse 4 times per day   potassium chloride  40 mEq Per Tube BID   scopolamine  1 patch Transdermal Q72H   thiamine  100 mg Per Tube Daily   ticagrelor  90 mg Per Tube BID   Continuous Infusions: PRN Meds:.[DISCONTINUED] acetaminophen **OR** acetaminophen (TYLENOL) oral liquid 160 mg/5 mL **OR** [DISCONTINUED] acetaminophen, bisacodyl, hydrALAZINE, labetalol, liver oil-zinc oxide, mouth rinse, polyethylene glycol, sodium chloride  Current Outpatient Medications  Medication Instructions   ACCU-CHEK FASTCLIX LANCETS MISC  Use as directed twice daily dia E11.65    atorvastatin (LIPITOR) 20 mg, Oral, Daily   atorvastatin (LIPITOR) 80 mg, Oral, Daily   Blood Glucose Monitoring Suppl (ACCU-CHEK AVIVA PLUS) w/Device KIT Use as directed   cetirizine (ZYRTEC) 10 mg, Oral, Daily   glucose blood (ACCU-CHEK AVIVA PLUS) test strip Use two times daily to check blood sugar   hydrochlorothiazide (MICROZIDE) 12.5 mg, Oral, Daily   lisinopril (ZESTRIL) 40 mg, Oral, Daily   metFORMIN (GLUCOPHAGE) 1,000 mg, Oral, 2 times daily with meals   metoprolol tartrate (LOPRESSOR) 25 mg, Oral, Daily    Diet Orders (From admission, onward)     Start     Ordered   10/20/21 0819  Diet NPO time specified  Diet effective now       Comments: All medications and diet via feeding tube   10/20/21 0818            DVT prophylaxis: Place TED hose Start: 08/22/21 1546 SCD's Start: 08/03/21 2311   Lab Results  Component Value Date   PLT 375 11/17/2021      Code Status: Full Code  Family Communication: no family at bedside   Status is: Inpatient  Remains inpatient appropriate because: awaiting placement   Level of care: Med-Surg  Objective: Vitals:   11/18/21 0317 11/18/21 0756 11/18/21 0851 11/18/21 0900  BP:  139/82  Pulse: 83 85 91   Resp: (!) 32 20 (!) 27 20  Temp:  99.1 F (37.3 C)    TempSrc:  Oral    SpO2: 97% 96% 99%   Weight:      Height:        Intake/Output Summary (Last 24 hours) at 11/18/2021 1024 Last data filed at 11/18/2021 0709 Gross per 24 hour  Intake 474 ml  Output 300 ml  Net 174 ml    Wt Readings from Last 3 Encounters:  10/04/21 79.8 kg  08/02/21 83.9 kg  02/20/20 88.6 kg    Examination:  Constitutional: nad Respiratory: cta Cardiovascular: rrr  Data Reviewed: I have independently reviewed following labs and imaging studies   CBC Recent Labs  Lab 11/17/21 0600  WBC 7.4  HGB 13.5  HCT 42.3  PLT 375  MCV 82.3  MCH 26.3  MCHC 31.9  RDW 15.7*  LYMPHSABS 1.4   MONOABS 0.6  EOSABS 0.8*  BASOSABS 0.0     Recent Labs  Lab 11/14/21 0413 11/17/21 0600  NA 139 142  K 4.2 3.6  CL 106 108  CO2 25 26  GLUCOSE 114* 97  BUN 13 11  CREATININE 0.54* 0.50*  CALCIUM 9.2 9.4  AST  --  33  ALT  --  62*  ALKPHOS  --  39  BILITOT  --  0.6  ALBUMIN  --  3.2*  MG  --  1.6*     ------------------------------------------------------------------------------------------------------------------ No results for input(s): "CHOL", "HDL", "LDLCALC", "TRIG", "CHOLHDL", "LDLDIRECT" in the last 72 hours.  Lab Results  Component Value Date   HGBA1C 8.3 (H) 08/03/2021   ------------------------------------------------------------------------------------------------------------------ No results for input(s): "TSH", "T4TOTAL", "T3FREE", "THYROIDAB" in the last 72 hours.  Invalid input(s): "FREET3"  Cardiac Enzymes No results for input(s): "CKMB", "TROPONINI", "MYOGLOBIN" in the last 168 hours.  Invalid input(s): "CK" ------------------------------------------------------------------------------------------------------------------    Component Value Date/Time   BNP 15.9 10/26/2021 0817    CBG: Recent Labs  Lab 11/16/21 2324 11/17/21 0552 11/17/21 1136 11/17/21 2351 11/18/21 0552  GLUCAP 226* 93 260* 229* 134*     No results found for this or any previous visit (from the past 240 hour(s)).   Radiology Studies: No results found.   Marzetta Board, MD, PhD [facility]  Between 7 am - 7 pm I am available, please contact me via Amion (for emergencies) or Securechat (non urgent messages)  Between 7 pm - 7 am I am not available, please contact night coverage MD/APP via Amion

## 2021-11-19 LAB — BASIC METABOLIC PANEL
Anion gap: 11 (ref 5–15)
BUN: 15 mg/dL (ref 8–23)
CO2: 26 mmol/L (ref 22–32)
Calcium: 9.5 mg/dL (ref 8.9–10.3)
Chloride: 106 mmol/L (ref 98–111)
Creatinine, Ser: 0.52 mg/dL — ABNORMAL LOW (ref 0.61–1.24)
GFR, Estimated: >60 mL/min (ref >60)
Glucose, Bld: 110 mg/dL — ABNORMAL HIGH (ref 70–99)
Potassium: 3.7 mmol/L (ref 3.5–5.1)
Sodium: 143 mmol/L (ref 135–145)

## 2021-11-19 LAB — GLUCOSE, CAPILLARY
Glucose-Capillary: 105 mg/dL — ABNORMAL HIGH (ref 70–99)
Glucose-Capillary: 185 mg/dL — ABNORMAL HIGH (ref 70–99)
Glucose-Capillary: 211 mg/dL — ABNORMAL HIGH (ref 70–99)
Glucose-Capillary: 219 mg/dL — ABNORMAL HIGH (ref 70–99)

## 2021-11-19 LAB — MAGNESIUM: Magnesium: 1.9 mg/dL (ref 1.7–2.4)

## 2021-11-19 NOTE — Plan of Care (Signed)
Problem: Education: Goal: Knowledge of General Education information will improve Description: Including pain rating scale, medication(s)/side effects and non-pharmacologic comfort measures Outcome: Progressing   Problem: Health Behavior/Discharge Planning: Goal: Ability to manage health-related needs will improve Outcome: Progressing   Problem: Clinical Measurements: Goal: Ability to maintain clinical measurements within normal limits will improve Outcome: Progressing Goal: Will remain free from infection Outcome: Progressing Goal: Diagnostic test results will improve Outcome: Progressing Goal: Respiratory complications will improve Outcome: Progressing Goal: Cardiovascular complication will be avoided Outcome: Progressing   Problem: Activity: Goal: Risk for activity intolerance will decrease Outcome: Progressing   Problem: Nutrition: Goal: Adequate nutrition will be maintained Outcome: Progressing   Problem: Coping: Goal: Level of anxiety will decrease Outcome: Progressing   Problem: Elimination: Goal: Will not experience complications related to bowel motility Outcome: Progressing Goal: Will not experience complications related to urinary retention Outcome: Progressing   Problem: Pain Managment: Goal: General experience of comfort will improve Outcome: Progressing   Problem: Safety: Goal: Ability to remain free from injury will improve Outcome: Progressing   Problem: Skin Integrity: Goal: Risk for impaired skin integrity will decrease Outcome: Progressing   Problem: Education: Goal: Ability to describe self-care measures that may prevent or decrease complications (Diabetes Survival Skills Education) will improve Outcome: Progressing Goal: Individualized Educational Video(s) Outcome: Progressing   Problem: Coping: Goal: Ability to adjust to condition or change in health will improve Outcome: Progressing   Problem: Fluid Volume: Goal: Ability to  maintain a balanced intake and output will improve Outcome: Progressing   Problem: Health Behavior/Discharge Planning: Goal: Ability to identify and utilize available resources and services will improve Outcome: Progressing Goal: Ability to manage health-related needs will improve Outcome: Progressing   Problem: Metabolic: Goal: Ability to maintain appropriate glucose levels will improve Outcome: Progressing   Problem: Nutritional: Goal: Maintenance of adequate nutrition will improve Outcome: Progressing Goal: Progress toward achieving an optimal weight will improve Outcome: Progressing   Problem: Skin Integrity: Goal: Risk for impaired skin integrity will decrease Outcome: Progressing   Problem: Tissue Perfusion: Goal: Adequacy of tissue perfusion will improve Outcome: Progressing   Problem: Education: Goal: Knowledge of disease or condition will improve Outcome: Progressing Goal: Knowledge of secondary prevention will improve (SELECT ALL) Outcome: Progressing Goal: Knowledge of patient specific risk factors will improve (INDIVIDUALIZE FOR PATIENT) Outcome: Progressing Goal: Individualized Educational Video(s) Outcome: Progressing   Problem: Coping: Goal: Will verbalize positive feelings about self Outcome: Progressing Goal: Will identify appropriate support needs Outcome: Progressing   Problem: Health Behavior/Discharge Planning: Goal: Ability to manage health-related needs will improve Outcome: Progressing   Problem: Self-Care: Goal: Ability to participate in self-care as condition permits will improve Outcome: Progressing Goal: Verbalization of feelings and concerns over difficulty with self-care will improve Outcome: Progressing Goal: Ability to communicate needs accurately will improve Outcome: Progressing   Problem: Nutrition: Goal: Risk of aspiration will decrease Outcome: Progressing Goal: Dietary intake will improve Outcome: Progressing    Problem: Ischemic Stroke/TIA Tissue Perfusion: Goal: Complications of ischemic stroke/TIA will be minimized Outcome: Progressing   Problem: Education: Goal: Understanding of CV disease, CV risk reduction, and recovery process will improve Outcome: Progressing Goal: Individualized Educational Video(s) Outcome: Progressing   Problem: Activity: Goal: Ability to return to baseline activity level will improve Outcome: Progressing   Problem: Cardiovascular: Goal: Ability to achieve and maintain adequate cardiovascular perfusion will improve Outcome: Progressing Goal: Vascular access site(s) Level 0-1 will be maintained Outcome: Progressing   Problem: Health Behavior/Discharge Planning: Goal: Ability to safely manage  health-related needs after discharge will improve Outcome: Progressing

## 2021-11-19 NOTE — TOC Progression Note (Signed)
Transition of Care [facility]) - Progression Note    Patient Details  Name: Joshua Donovan MRN: [id number] Date of Birth: 11-15-1955  Transition of Care [facility]) CM/SW [place], [place] Phone Number: 11/19/2021, 2:36 PM  Clinical Narrative:     CSW called and left message with Tobie Poet in admissions at [facility]. Left message requesting return call.   Expected Discharge Plan: Skilled Nursing Facility Barriers to Discharge: SNF Pending bed offer  Expected Discharge Plan and Services Expected Discharge Plan: [place]   Discharge Planning Services: CM Consult Post Acute Care Choice: Long Term Acute Care (LTAC) Living arrangements for the past 2 months: Single Family Home                                       Social Determinants of Health (SDOH) Interventions    Readmission Risk Interventions     No data to display

## 2021-11-19 NOTE — Progress Notes (Signed)
PROGRESS NOTE  Joshua Donovan BOF:[id number] DOB: 02/23/1955 DOA: 08/03/2021 PCP: [facility] [place]   LOS: 108 days   Brief Narrative / Interim history: 66 year old with a history of ICH, DM 2, HTN, and HLD who presented to [facility] 7/24 with right hand numbness and weakness and was diagnosed with a small brainstem stroke.  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  He required intubation and was transferred to [facility] for an interventional radiology intervention.  He ultimately required tracheostomy and PEG tube placement, and has subsequently suffered a prolonged hospital stay.   Significant events: 7/24 presented to [facility], ventral medulla CVA 7/25 tx to [facility], treated with Cleviprex 7/26 cerebral angiogram with stent placement to right vertebro-basilar junction, failed extubation, MRI brain> mild extension of stroke, now involving bilateral medial medullary, patent basilar artery and R VBJ stent  7/28 bedside tracheostomy by PCCM and PEG tube placement by General Surgery 7/31 Bleeding around trach site, bright red blood.  Underwent bronchoscopy, Trach Removed, patient orally reintubated, stoma packed. 8/2 ENT took patient to OR for trach redo 8/9 transferred to medical floor on tracheostomy 8/12 vomiting after bolus feeding with worsening hypoxia 8/13 copious bleeding from the heparin injection site controlled with pressure dressing 8/14 bleeding has resolved.  Trach secretions blood-tinged. 8/15 insurance denied LTAC appeal.  CSW working on SNF. 8/28: peer to peer for LTAC looks like this was denied 8/29: V. tach runs - increased metoprolol  9/2: Slightly increased work of [place] 99 for chest x-ray showed no interval changes 9/5:  Ancef started 2/2 secretions --no wbc or fever 9/12 completed abx course  10/28 completed another antibiotic course Currently Awaiting placement  - [name]- s/p trach  change per RT on 11/3  Subjective / 24h Interval events: Alert, no complaints.  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  Continue supportive care.  Continue PT OT as tolerated, await SNF bed.  -PER MD [name] on 10/20/2021 duration of dual antiplatelet therapy is 6 months starting from 08/04/2021 thereafter aspirin alone.   Active problems Acute respiratory failure with hypoxia (HCC) along with aspiration pneumonia-Patient required ICU admission and mechanical ventilation after admission secondary to worsening mentation. Concern for possible aspiration pneumonitis. Patient unable to extubate. Tracheostomy placed on 7/31 and revised by ENT on 08/11/21 secondary to bleeding. Currently stable. He has finished antibiotic treatment for his infections.  Most recently grew Serratia / enterobacter / staph aureus, and for next antibiotics on 10/28.  Now monitor off antibiotics.  Afebrile -using PRN lasix   Alcohol abuse - Initially managed on CIWA.  No signs of DTs now, Continue thiamine, multivitamin and folic acid.   Mixed hyperlipidemia - Continue Lipitor.   Hypertension - Continue antihypertensives as below   NSVT (nonsustained ventricular tachycardia) (HCC) EF 60% -on beta-blocker.  Cardiology consulted one-point but not  actively following.   Dysphagia - Patient is s/p PEG tube and is on tube feeds via PEG. Continue tube feeds and free water.   Tracheostomy complication (HCC)-resolved as of 08/25/2021 - In setting of DAPT. ENT consulted for revision. Bleeding resolved.   Hypokalemia and hypomagnesemia -stopped HCTZ which likely is worsening his hypokalemia, still on scheduled potassium supplementation monitor potassium levels closely.  Repeat blood work this morning looks normal potassium and magnesium   Uncontrolled type 2 diabetes mellitus with hyperglycemia (HCC) - Most recent hemoglobin A1C of 8.3%. uncontrolled with hyper- and hypoglycemia.  Levemir along with NovoLog every 4 hours.  Continue to monitor CBGs, acceptable today  CBG (last 3)  Recent Labs    11/18/21 2111 11/18/21 2331 11/19/21 0613  GLUCAP 178* 240* 105*     Scheduled Meds:  amLODipine  10 mg Per Tube Daily   aspirin  81 mg Per Tube Daily   atorvastatin  80 mg Per Tube Daily   carvedilol  37.5 mg Per Tube BID WC   diclofenac Sodium  2 g Topical QID   famotidine  20 mg Per Tube Daily   feeding supplement ([name])  237 mL Per Tube 6 X Daily   feeding supplement (PROSource TF20)  60 mL Per Tube Daily   folic acid  1 mg Per Tube Daily   free water  100 mL Per Tube Q4H   guaiFENesin  10 mL Per Tube Q4H   insulin aspart  0-9 Units Subcutaneous Q6H   insulin detemir  30 Units Subcutaneous BID   multivitamin with minerals  1 tablet Per Tube Daily   mouth rinse  15 mL Mouth Rinse 4 times per day   potassium chloride  40 mEq Per Tube BID   scopolamine  1 patch Transdermal Q72H   thiamine  100 mg Per Tube Daily   ticagrelor  90 mg Per Tube BID   Continuous Infusions: PRN Meds:.[DISCONTINUED] acetaminophen **OR** acetaminophen (TYLENOL) oral liquid 160 mg/5 mL **OR** [DISCONTINUED] acetaminophen, bisacodyl, hydrALAZINE, labetalol, liver oil-zinc oxide, mouth rinse, polyethylene glycol, sodium chloride  Current  Outpatient Medications  Medication Instructions   ACCU-CHEK FASTCLIX LANCETS MISC Use as directed twice daily dia E11.65    atorvastatin (LIPITOR) 20 mg, Oral, Daily   atorvastatin (LIPITOR) 80 mg, Oral, Daily   Blood Glucose Monitoring Suppl (ACCU-CHEK AVIVA PLUS) w/Device KIT Use as directed   cetirizine (ZYRTEC) 10 mg, Oral, Daily   glucose blood (ACCU-CHEK AVIVA PLUS) test strip Use two times daily to check blood sugar   hydrochlorothiazide (MICROZIDE) 12.5 mg, Oral, Daily   lisinopril (ZESTRIL) 40 mg, Oral, Daily   metFORMIN (GLUCOPHAGE) 1,000 mg, Oral, 2 times daily with meals   metoprolol tartrate (LOPRESSOR) 25 mg, Oral, Daily    Diet Orders (From admission, onward)     Start     Ordered   10/20/21 0819  Diet NPO time specified  Diet effective now       Comments: All medications and diet via feeding tube   10/20/21 0818            DVT prophylaxis: Place TED hose Start: 08/22/21 1546 SCD's Start: 08/03/21 2311   Lab Results  Component Value Date   PLT 375 11/17/2021      Code Status: Full Code  Family Communication: no family at bedside   Status is: Inpatient  Remains inpatient appropriate because: awaiting placement   Level of care: Med-Surg  Objective: Vitals:  11/19/21 0800 11/19/21 0815 11/19/21 1000 11/19/21 1100  BP: (!) 152/89     Pulse: 85 87 91 88  Resp: (!) 23 (!) 24 (!) 24 (!) 25  Temp: 98.6 F (37 C)     TempSrc: Oral     SpO2: 95% 95% 96% 96%  Weight:      Height:        Intake/Output Summary (Last 24 hours) at 11/19/2021 1107 Last data filed at 11/19/2021 0855 Gross per 24 hour  Intake 6617 ml  Output 1400 ml  Net 5217 ml    Wt Readings from Last 3 Encounters:  10/04/21 79.8 kg  08/02/21 83.9 kg  02/20/20 88.6 kg    Examination:  Constitutional: NAD Eyes: lids and conjunctivae normal, no scleral icterus ENMT: mmm Neck: normal, supple Respiratory: clear to auscultation bilaterally, no wheezing, no crackles. Normal  respiratory effort.  Cardiovascular: Regular rate and rhythm, no murmurs / rubs / gallops. No LE edema. Abdomen: soft, no distention, no tenderness. Bowel sounds positive.  Skin: no rashes Neurologic: Quadriplegic  Data Reviewed: I have independently reviewed following labs and imaging studies   CBC Recent Labs  Lab 11/17/21 0600  WBC 7.4  HGB 13.5  HCT 42.3  PLT 375  MCV 82.3  MCH 26.3  MCHC 31.9  RDW 15.7*  LYMPHSABS 1.4  MONOABS 0.6  EOSABS 0.8*  BASOSABS 0.0     Recent Labs  Lab 11/14/21 0413 11/17/21 0600 11/19/21 0443  NA 139 142 143  K 4.2 3.6 3.7  CL 106 108 106  CO2 _0 GLUCOSE 114* 97 110*  BUN _1 CREATININE 0.54* 0.50* 0.52*  CALCIUM 9.2 9.4 9.5  AST  --  33  --   ALT  --  62*  --   ALKPHOS  --  39  --   BILITOT  --  0.6  --   ALBUMIN  --  3.2*  --   MG  --  1.6* 1.9     ------------------------------------------------------------------------------------------------------------------ No results for input(s): "CHOL", "HDL", "LDLCALC", "TRIG", "CHOLHDL", "LDLDIRECT" in the last 72 hours.  Lab Results  Component Value Date   HGBA1C 8.3 (H) 08/03/2021   ------------------------------------------------------------------------------------------------------------------ No results for input(s): "TSH", "T4TOTAL", "T3FREE", "THYROIDAB" in the last 72 hours.  Invalid input(s): "FREET3"  Cardiac Enzymes No results for input(s): "CKMB", "TROPONINI", "MYOGLOBIN" in the last 168 hours.  Invalid input(s): "CK" ------------------------------------------------------------------------------------------------------------------    Component Value Date/Time   BNP 15.9 10/26/2021 0817    CBG: Recent Labs  Lab 11/18/21 1214 11/18/21 1723 11/18/21 2111 11/18/21 2331 11/19/21 0613  GLUCAP 277* 296* 178* 240* 105*     No results found for this or any previous visit (from the past 240 hour(s)).   Radiology Studies: No results  found.   Marzetta Board, MD, PhD [facility]  Between 7 am - 7 pm I am available, please contact me via Amion (for emergencies) or Securechat (non urgent messages)  Between 7 pm - 7 am I am not available, please contact night coverage MD/APP via Amion

## 2021-11-20 LAB — GLUCOSE, CAPILLARY
Glucose-Capillary: 121 mg/dL — ABNORMAL HIGH (ref 70–99)
Glucose-Capillary: 248 mg/dL — ABNORMAL HIGH (ref 70–99)
Glucose-Capillary: 135 mg/dL — ABNORMAL HIGH (ref 70–99)
Glucose-Capillary: 272 mg/dL — ABNORMAL HIGH (ref 70–99)

## 2021-11-20 NOTE — Plan of Care (Signed)
Problem: Education: Goal: Knowledge of General Education information will improve Description: Including pain rating scale, medication(s)/side effects and non-pharmacologic comfort measures Outcome: Progressing   Problem: Health Behavior/Discharge Planning: Goal: Ability to manage health-related needs will improve Outcome: Progressing   Problem: Clinical Measurements: Goal: Ability to maintain clinical measurements within normal limits will improve Outcome: Progressing Goal: Will remain free from infection Outcome: Progressing Goal: Diagnostic test results will improve Outcome: Progressing Goal: Respiratory complications will improve Outcome: Progressing Goal: Cardiovascular complication will be avoided Outcome: Progressing   Problem: Activity: Goal: Risk for activity intolerance will decrease Outcome: Progressing   Problem: Nutrition: Goal: Adequate nutrition will be maintained Outcome: Progressing   Problem: Coping: Goal: Level of anxiety will decrease Outcome: Progressing   Problem: Elimination: Goal: Will not experience complications related to bowel motility Outcome: Progressing Goal: Will not experience complications related to urinary retention Outcome: Progressing   Problem: Pain Managment: Goal: General experience of comfort will improve Outcome: Progressing   Problem: Safety: Goal: Ability to remain free from injury will improve Outcome: Progressing   Problem: Skin Integrity: Goal: Risk for impaired skin integrity will decrease Outcome: Progressing   Problem: Education: Goal: Ability to describe self-care measures that may prevent or decrease complications (Diabetes Survival Skills Education) will improve Outcome: Progressing Goal: Individualized Educational Video(s) Outcome: Progressing   Problem: Coping: Goal: Ability to adjust to condition or change in health will improve Outcome: Progressing   Problem: Fluid Volume: Goal: Ability to  maintain a balanced intake and output will improve Outcome: Progressing   Problem: Health Behavior/Discharge Planning: Goal: Ability to identify and utilize available resources and services will improve Outcome: Progressing Goal: Ability to manage health-related needs will improve Outcome: Progressing   Problem: Metabolic: Goal: Ability to maintain appropriate glucose levels will improve Outcome: Progressing   Problem: Nutritional: Goal: Maintenance of adequate nutrition will improve Outcome: Progressing Goal: Progress toward achieving an optimal weight will improve Outcome: Progressing   Problem: Skin Integrity: Goal: Risk for impaired skin integrity will decrease Outcome: Progressing   Problem: Tissue Perfusion: Goal: Adequacy of tissue perfusion will improve Outcome: Progressing   Problem: Education: Goal: Knowledge of disease or condition will improve Outcome: Progressing Goal: Knowledge of secondary prevention will improve (SELECT ALL) Outcome: Progressing Goal: Knowledge of patient specific risk factors will improve (INDIVIDUALIZE FOR PATIENT) Outcome: Progressing Goal: Individualized Educational Video(s) Outcome: Progressing   Problem: Coping: Goal: Will verbalize positive feelings about self Outcome: Progressing Goal: Will identify appropriate support needs Outcome: Progressing   Problem: Health Behavior/Discharge Planning: Goal: Ability to manage health-related needs will improve Outcome: Progressing   Problem: Self-Care: Goal: Ability to participate in self-care as condition permits will improve Outcome: Progressing Goal: Verbalization of feelings and concerns over difficulty with self-care will improve Outcome: Progressing Goal: Ability to communicate needs accurately will improve Outcome: Progressing   Problem: Nutrition: Goal: Risk of aspiration will decrease Outcome: Progressing Goal: Dietary intake will improve Outcome: Progressing    Problem: Ischemic Stroke/TIA Tissue Perfusion: Goal: Complications of ischemic stroke/TIA will be minimized Outcome: Progressing   Problem: Education: Goal: Understanding of CV disease, CV risk reduction, and recovery process will improve Outcome: Progressing Goal: Individualized Educational Video(s) Outcome: Progressing   Problem: Activity: Goal: Ability to return to baseline activity level will improve Outcome: Progressing   Problem: Cardiovascular: Goal: Ability to achieve and maintain adequate cardiovascular perfusion will improve Outcome: Progressing Goal: Vascular access site(s) Level 0-1 will be maintained Outcome: Progressing   Problem: Health Behavior/Discharge Planning: Goal: Ability to safely manage  health-related needs after discharge will improve Outcome: Progressing

## 2021-11-20 NOTE — Progress Notes (Signed)
PROGRESS NOTE  Joshua Donovan MVH:[id number] DOB: Feb 28, 1955 DOA: 08/03/2021 PCP: [facility] [place]   LOS: 109 days   Brief Narrative / Interim history: 66 year old with a history of ICH, DM 2, HTN, and HLD who presented to [facility] 7/24 with right hand numbness and weakness and was diagnosed with a small brainstem stroke.  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  He required intubation and was transferred to [facility] for an interventional radiology intervention.  He ultimately required tracheostomy and PEG tube placement, and has subsequently suffered a prolonged hospital stay.   Significant events: 7/24 presented to [facility], ventral medulla CVA 7/25 tx to [facility], treated with Cleviprex 7/26 cerebral angiogram with stent placement to right vertebro-basilar junction, failed extubation, MRI brain> mild extension of stroke, now involving bilateral medial medullary, patent basilar artery and R VBJ stent  7/28 bedside tracheostomy by PCCM and PEG tube placement by General Surgery 7/31 Bleeding around trach site, bright red blood.  Underwent bronchoscopy, Trach Removed, patient orally reintubated, stoma packed. 8/2 ENT took patient to OR for trach redo 8/9 transferred to medical floor on tracheostomy 8/12 vomiting after bolus feeding with worsening hypoxia 8/13 copious bleeding from the heparin injection site controlled with pressure dressing 8/14 bleeding has resolved.  Trach secretions blood-tinged. 8/15 insurance denied LTAC appeal.  CSW working on SNF. 8/28: peer to peer for LTAC looks like this was denied 8/29: V. tach runs - increased metoprolol  9/2: Slightly increased work of [place] 99 for chest x-ray showed no interval changes 9/5:  Ancef started 2/2 secretions --no wbc or fever 9/12 completed abx course  10/28 completed another antibiotic course Currently Awaiting placement  - [name]- s/p trach  change per RT on 11/3  Subjective / 24h Interval events: Had some emesis this morning right after getting bolus feeds into his PEG tube.  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  Continue supportive care.  Continue PT OT as tolerated, await SNF bed.  -PER MD [name] on 10/20/2021 duration of dual antiplatelet therapy is 6 months starting from 08/04/2021 thereafter aspirin alone.   Active problems Acute respiratory failure with hypoxia (HCC) along with aspiration pneumonia-Patient required ICU admission and mechanical ventilation after admission secondary to worsening mentation. Concern for possible aspiration pneumonitis. Patient unable to extubate. Tracheostomy placed on 7/31 and revised by ENT on 08/11/21 secondary to bleeding. Currently stable. He has finished antibiotic treatment for his infections.  Most recently grew Serratia / enterobacter / staph aureus, and for next antibiotics on 10/28.  -Continue to monitor off antibiotics, but seems like he may have aspirated a little bit today.  Will monitor fever curve   Alcohol abuse - Initially managed on CIWA.  No signs of DTs now, Continue thiamine, multivitamin and folic acid.   Mixed hyperlipidemia - Continue Lipitor.  Hypertension - Continue antihypertensives as below   NSVT (nonsustained  ventricular tachycardia) (HCC) EF 60% -on beta-blocker.  Cardiology consulted one-point but not actively following.   Dysphagia - Patient is s/p PEG tube and is on tube feeds via PEG. Continue tube feeds and free water.  Monitor closely due to vomiting this morning   Tracheostomy complication (HCC)-resolved as of 08/25/2021 - In setting of DAPT. ENT consulted for revision. Bleeding resolved.   Hypokalemia and hypomagnesemia -stopped HCTZ which likely is worsening his hypokalemia, still on scheduled potassium supplementation monitor potassium levels closely.  Repeat blood work this morning looks normal potassium and magnesium   Uncontrolled type 2 diabetes mellitus with hyperglycemia (HCC) - Most recent hemoglobin A1C of 8.3%. uncontrolled with hyper- and hypoglycemia.  Levemir along with NovoLog every 4 hours.  CBGs overall acceptable  CBG (last 3)  Recent Labs    11/19/21 1741 11/19/21 2323 11/20/21 0547  GLUCAP 211* 219* 121*     Scheduled Meds:  amLODipine  10 mg Per Tube Daily   aspirin  81 mg Per Tube Daily   atorvastatin  80 mg Per Tube Daily   carvedilol  37.5 mg Per Tube BID WC   diclofenac Sodium  2 g Topical QID   famotidine  20 mg Per Tube Daily   feeding supplement ([name])  237 mL Per Tube 6 X Daily   feeding supplement (PROSource TF20)  60 mL Per Tube Daily   folic acid  1 mg Per Tube Daily   free water  100 mL Per Tube Q4H   guaiFENesin  10 mL Per Tube Q4H   insulin aspart  0-9 Units Subcutaneous Q6H   insulin detemir  30 Units Subcutaneous BID   multivitamin with minerals  1 tablet Per Tube Daily   mouth rinse  15 mL Mouth Rinse 4 times per day   potassium chloride  40 mEq Per Tube BID   scopolamine  1 patch Transdermal Q72H   thiamine  100 mg Per Tube Daily   ticagrelor  90 mg Per Tube BID   Continuous Infusions: PRN Meds:.[DISCONTINUED] acetaminophen **OR** acetaminophen (TYLENOL) oral liquid 160 mg/5 mL **OR** [DISCONTINUED] acetaminophen,  bisacodyl, hydrALAZINE, labetalol, liver oil-zinc oxide, mouth rinse, polyethylene glycol, sodium chloride  Current Outpatient Medications  Medication Instructions   ACCU-CHEK FASTCLIX LANCETS MISC Use as directed twice daily dia E11.65    atorvastatin (LIPITOR) 20 mg, Oral, Daily   atorvastatin (LIPITOR) 80 mg, Oral, Daily   Blood Glucose Monitoring Suppl (ACCU-CHEK AVIVA PLUS) w/Device KIT Use as directed   cetirizine (ZYRTEC) 10 mg, Oral, Daily   glucose blood (ACCU-CHEK AVIVA PLUS) test strip Use two times daily to check blood sugar   hydrochlorothiazide (MICROZIDE) 12.5 mg, Oral, Daily   lisinopril (ZESTRIL) 40 mg, Oral, Daily   metFORMIN (GLUCOPHAGE) 1,000 mg, Oral, 2 times daily with meals   metoprolol tartrate (LOPRESSOR) 25 mg, Oral, Daily    Diet Orders (From admission, onward)     Start     Ordered   10/20/21 0819  Diet NPO time specified  Diet effective now       Comments: All medications and diet via feeding tube   10/20/21 0818            DVT prophylaxis: Place TED hose Start: 08/22/21 1546 SCD's Start: 08/03/21 2311   Lab Results  Component Value Date   PLT 375 11/17/2021      Code Status: Full Code  Family Communication: no family  at bedside   Status is: Inpatient  Remains inpatient appropriate because: awaiting placement   Level of care: Med-Surg  Objective: Vitals:   11/20/21 0306 11/20/21 0400 11/20/21 0743 11/20/21 0931  BP: (!) 143/86  (!) 122/95 (!) 156/99  Pulse: 83  81 81  Resp: (!) 24  (!) 21 16  Temp: 98.8 F (37.1 C)  98.7 F (37.1 C)   TempSrc: Oral  Oral   SpO2: 97% 97% 95% 95%  Weight:      Height:        Intake/Output Summary (Last 24 hours) at 11/20/2021 1052 Last data filed at 11/20/2021 0500 Gross per 24 hour  Intake 300 ml  Output 1250 ml  Net -950 ml    Wt Readings from Last 3 Encounters:  10/04/21 79.8 kg  08/02/21 83.9 kg  02/20/20 88.6 kg    Examination:  Constitutional: NAD Eyes: lids and  conjunctivae normal, no scleral icterus ENMT: mmm Neck: normal, supple Respiratory: clear to auscultation bilaterally, no wheezing, no crackles. Cardiovascular: Regular rate and rhythm, no murmurs / rubs / gallops. No LE edema. Abdomen: soft, no distention, no tenderness. Bowel sounds positive.   Data Reviewed: I have independently reviewed following labs and imaging studies   CBC Recent Labs  Lab 11/17/21 0600  WBC 7.4  HGB 13.5  HCT 42.3  PLT 375  MCV 82.3  MCH 26.3  MCHC 31.9  RDW 15.7*  LYMPHSABS 1.4  MONOABS 0.6  EOSABS 0.8*  BASOSABS 0.0     Recent Labs  Lab 11/14/21 0413 11/17/21 0600 11/19/21 0443  NA 139 142 143  K 4.2 3.6 3.7  CL 106 108 106  CO2 _0 GLUCOSE 114* 97 110*  BUN _1 CREATININE 0.54* 0.50* 0.52*  CALCIUM 9.2 9.4 9.5  AST  --  33  --   ALT  --  62*  --   ALKPHOS  --  39  --   BILITOT  --  0.6  --   ALBUMIN  --  3.2*  --   MG  --  1.6* 1.9     ------------------------------------------------------------------------------------------------------------------ No results for input(s): "CHOL", "HDL", "LDLCALC", "TRIG", "CHOLHDL", "LDLDIRECT" in the last 72 hours.  Lab Results  Component Value Date   HGBA1C 8.3 (H) 08/03/2021   ------------------------------------------------------------------------------------------------------------------ No results for input(s): "TSH", "T4TOTAL", "T3FREE", "THYROIDAB" in the last 72 hours.  Invalid input(s): "FREET3"  Cardiac Enzymes No results for input(s): "CKMB", "TROPONINI", "MYOGLOBIN" in the last 168 hours.  Invalid input(s): "CK" ------------------------------------------------------------------------------------------------------------------    Component Value Date/Time   BNP 15.9 10/26/2021 0817    CBG: Recent Labs  Lab 11/19/21 0613 11/19/21 1144 11/19/21 1741 11/19/21 2323 11/20/21 0547  GLUCAP 105* 185* 211* 219* 121*     No results found for this or any  previous visit (from the past 240 hour(s)).   Radiology Studies: No results found.   Marzetta Board, MD, PhD [facility]  Between 7 am - 7 pm I am available, please contact me via Amion (for emergencies) or Securechat (non urgent messages)  Between 7 pm - 7 am I am not available, please contact night coverage MD/APP via Amion

## 2021-11-21 LAB — BASIC METABOLIC PANEL
Anion gap: 11 (ref 5–15)
BUN: 13 mg/dL (ref 8–23)
CO2: 24 mmol/L (ref 22–32)
Calcium: 9.2 mg/dL (ref 8.9–10.3)
Chloride: 106 mmol/L (ref 98–111)
Creatinine, Ser: 0.46 mg/dL — ABNORMAL LOW (ref 0.61–1.24)
GFR, Estimated: >60 mL/min (ref >60)
Glucose, Bld: 117 mg/dL — ABNORMAL HIGH (ref 70–99)
Potassium: 3.5 mmol/L (ref 3.5–5.1)
Sodium: 141 mmol/L (ref 135–145)

## 2021-11-21 LAB — CBC
HCT: 38.7 % — ABNORMAL LOW (ref 39.0–52.0)
Hemoglobin: 12.9 g/dL — ABNORMAL LOW (ref 13.0–17.0)
MCH: 26.8 pg (ref 26.0–34.0)
MCHC: 33.3 g/dL (ref 30.0–36.0)
MCV: 80.3 fL (ref 80.0–100.0)
Platelets: 361 10*3/uL (ref 150–400)
RBC: 4.82 MIL/uL (ref 4.22–5.81)
RDW: 15.8 % — ABNORMAL HIGH (ref 11.5–15.5)
WBC: 8.3 10*3/uL (ref 4.0–10.5)
nRBC: 0 % (ref 0.0–0.2)

## 2021-11-21 LAB — GLUCOSE, CAPILLARY
Glucose-Capillary: 186 mg/dL — ABNORMAL HIGH (ref 70–99)
Glucose-Capillary: 287 mg/dL — ABNORMAL HIGH (ref 70–99)
Glucose-Capillary: 97 mg/dL (ref 70–99)

## 2021-11-21 LAB — MAGNESIUM: Magnesium: 1.7 mg/dL (ref 1.7–2.4)

## 2021-11-21 NOTE — Progress Notes (Signed)
PROGRESS NOTE  Joshua Donovan MEQ:[id number] DOB: 12/17/1955 DOA: 08/03/2021 PCP: [facility] [place]   LOS: 110 days   Brief Narrative / Interim history: 66 year old with a history of ICH, DM 2, HTN, and HLD who presented to [facility] 7/24 with right hand numbness and weakness and was diagnosed with a small brainstem stroke.  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  He required intubation and was transferred to [facility] for an interventional radiology intervention.  He ultimately required tracheostomy and PEG tube placement, and has subsequently suffered a prolonged hospital stay.   Significant events: 7/24 presented to [facility], ventral medulla CVA 7/25 tx to [facility], treated with Cleviprex 7/26 cerebral angiogram with stent placement to right vertebro-basilar junction, failed extubation, MRI brain> mild extension of stroke, now involving bilateral medial medullary, patent basilar artery and R VBJ stent  7/28 bedside tracheostomy by PCCM and PEG tube placement by General Surgery 7/31 Bleeding around trach site, bright red blood.  Underwent bronchoscopy, Trach Removed, patient orally reintubated, stoma packed. 8/2 ENT took patient to OR for trach redo 8/9 transferred to medical floor on tracheostomy 8/12 vomiting after bolus feeding with worsening hypoxia 8/13 copious bleeding from the heparin injection site controlled with pressure dressing 8/14 bleeding has resolved.  Trach secretions blood-tinged. 8/15 insurance denied LTAC appeal.  CSW working on SNF. 8/28: peer to peer for LTAC looks like this was denied 8/29: V. tach runs - increased metoprolol  9/2: Slightly increased work of [place] 99 for chest x-ray showed no interval changes 9/5:  Ancef started 2/2 secretions --no wbc or fever 9/12 completed abx course  10/28 completed another antibiotic course Currently Awaiting placement  - [name]- s/p trach  change per RT on 11/3  Subjective / 24h Interval events: Alert, no complaints  Assesement and Plan: Principal Problem:   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[place]) Active Problems:   Acute respiratory failure with hypoxia ([place])   Hypertension   Uncontrolled type 2 diabetes mellitus with hyperglycemia ([place])   Mixed hyperlipidemia   Alcohol abuse   NSVT (nonsustained ventricular tachycardia) (HCC)   Dysphagia   Tracheostomy in place ([place])   HAP (hospital-acquired pneumonia)   Pressure injury of skin   Principal problem 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  Continue supportive care.  Continue PT OT as tolerated, await SNF bed.  -PER MD [name] on 10/20/2021 duration of dual antiplatelet therapy is 6 months starting from 08/04/2021 thereafter aspirin alone.   Active problems Acute respiratory failure with hypoxia (HCC) along with aspiration pneumonia-Patient required ICU admission and mechanical ventilation after admission secondary to worsening mentation. Concern for possible aspiration pneumonitis. Patient unable to extubate. Tracheostomy placed on 7/31 and revised by ENT on 08/11/21 secondary to bleeding. Currently stable. He has finished antibiotic treatment for his infections.  Most recently grew Serratia / enterobacter / staph aureus, and for next antibiotics on 10/28.  -Afebrile   Alcohol abuse - Initially managed on CIWA.  No signs of DTs now, Continue thiamine, multivitamin and folic acid.   Mixed hyperlipidemia - Continue Lipitor.   Hypertension - Continue antihypertensives as below   NSVT (nonsustained ventricular tachycardia) (HCC) EF 60% -on beta-blocker.  Cardiology consulted one-point but not actively following.   Dysphagia - Patient is  s/p PEG tube and is on tube  feeds via PEG. Continue tube feeds and free water.     Tracheostomy complication (HCC)-resolved as of 08/25/2021 - In setting of DAPT. ENT consulted for revision. Bleeding resolved.   Hypokalemia and hypomagnesemia -stopped HCTZ which likely is worsening his hypokalemia, still on scheduled potassium supplementation monitor potassium levels closely.  Repeat blood work this morning looks normal potassium and magnesium   Uncontrolled type 2 diabetes mellitus with hyperglycemia (HCC) - Most recent hemoglobin A1C of 8.3%. uncontrolled with hyper- and hypoglycemia.  Levemir along with NovoLog every 4 hours.  CBGs overall acceptable  CBG (last 3)  Recent Labs    11/20/21 1750 11/20/21 2319 11/21/21 0540  GLUCAP 135* 272* 97     Scheduled Meds:  amLODipine  10 mg Per Tube Daily   aspirin  81 mg Per Tube Daily   atorvastatin  80 mg Per Tube Daily   carvedilol  37.5 mg Per Tube BID WC   diclofenac Sodium  2 g Topical QID   famotidine  20 mg Per Tube Daily   feeding supplement ([name])  237 mL Per Tube 6 X Daily   feeding supplement (PROSource TF20)  60 mL Per Tube Daily   folic acid  1 mg Per Tube Daily   free water  100 mL Per Tube Q4H   guaiFENesin  10 mL Per Tube Q4H   insulin aspart  0-9 Units Subcutaneous Q6H   insulin detemir  30 Units Subcutaneous BID   multivitamin with minerals  1 tablet Per Tube Daily   mouth rinse  15 mL Mouth Rinse 4 times per day   potassium chloride  40 mEq Per Tube BID   scopolamine  1 patch Transdermal Q72H   thiamine  100 mg Per Tube Daily   ticagrelor  90 mg Per Tube BID   Continuous Infusions: PRN Meds:.[DISCONTINUED] acetaminophen **OR** acetaminophen (TYLENOL) oral liquid 160 mg/5 mL **OR** [DISCONTINUED] acetaminophen, bisacodyl, hydrALAZINE, labetalol, liver oil-zinc oxide, mouth rinse, polyethylene glycol, sodium chloride  Current Outpatient Medications  Medication Instructions   ACCU-CHEK FASTCLIX LANCETS MISC Use as directed  twice daily dia E11.65    atorvastatin (LIPITOR) 20 mg, Oral, Daily   atorvastatin (LIPITOR) 80 mg, Oral, Daily   Blood Glucose Monitoring Suppl (ACCU-CHEK AVIVA PLUS) w/Device KIT Use as directed   cetirizine (ZYRTEC) 10 mg, Oral, Daily   glucose blood (ACCU-CHEK AVIVA PLUS) test strip Use two times daily to check blood sugar   hydrochlorothiazide (MICROZIDE) 12.5 mg, Oral, Daily   lisinopril (ZESTRIL) 40 mg, Oral, Daily   metFORMIN (GLUCOPHAGE) 1,000 mg, Oral, 2 times daily with meals   metoprolol tartrate (LOPRESSOR) 25 mg, Oral, Daily    Diet Orders (From admission, onward)     Start     Ordered   10/20/21 0819  Diet NPO time specified  Diet effective now       Comments: All medications and diet via feeding tube   10/20/21 0818            DVT prophylaxis: Place TED hose Start: 08/22/21 1546 SCD's Start: 08/03/21 2311   Lab Results  Component Value Date   PLT 361 11/21/2021      Code Status: Full Code  Family Communication: no family at bedside   Status is: Inpatient  Remains inpatient appropriate because: awaiting placement   Level of care: Med-Surg  Objective: Vitals:   11/21/21 0300 11/21/21 0338 11/21/21 0848 11/21/21 0853  BP:  112/73 Marland Kitchen)  152/90 (!) 152/90  Pulse: 85 79 84 84  Resp: (!) 34 (!) 25 (!) 27 (!) 22  Temp:  98.8 F (37.1 C) 98.6 F (37 C)   TempSrc:  Oral Oral   SpO2: 93% 96% 96% 97%  Weight:      Height:        Intake/Output Summary (Last 24 hours) at 11/21/2021 1104 Last data filed at 11/21/2021 0339 Gross per 24 hour  Intake --  Output 1450 ml  Net -1450 ml    Wt Readings from Last 3 Encounters:  10/04/21 79.8 kg  08/02/21 83.9 kg  02/20/20 88.6 kg    Examination:  Constitutional: nad Respiratory: CTA Cardiovascular: RRR  Data Reviewed: I have independently reviewed following labs and imaging studies   CBC Recent Labs  Lab 11/17/21 0600 11/21/21 0230  WBC 7.4 8.3  HGB 13.5 12.9*  HCT 42.3 38.7*  PLT 375  361  MCV 82.3 80.3  MCH 26.3 26.8  MCHC 31.9 33.3  RDW 15.7* 15.8*  LYMPHSABS 1.4  --   MONOABS 0.6  --   EOSABS 0.8*  --   BASOSABS 0.0  --      Recent Labs  Lab 11/17/21 0600 11/19/21 0443 11/21/21 0230  NA 142 143 141  K 3.6 3.7 3.5  CL 108 106 106  CO2 _0 GLUCOSE 97 110* 117*  BUN _1 CREATININE 0.50* 0.52* 0.46*  CALCIUM 9.4 9.5 9.2  AST 33  --   --   ALT 62*  --   --   ALKPHOS 39  --   --   BILITOT 0.6  --   --   ALBUMIN 3.2*  --   --   MG 1.6* 1.9 1.7     ------------------------------------------------------------------------------------------------------------------ No results for input(s): "CHOL", "HDL", "LDLCALC", "TRIG", "CHOLHDL", "LDLDIRECT" in the last 72 hours.  Lab Results  Component Value Date   HGBA1C 8.3 (H) 08/03/2021   ------------------------------------------------------------------------------------------------------------------ No results for input(s): "TSH", "T4TOTAL", "T3FREE", "THYROIDAB" in the last 72 hours.  Invalid input(s): "FREET3"  Cardiac Enzymes No results for input(s): "CKMB", "TROPONINI", "MYOGLOBIN" in the last 168 hours.  Invalid input(s): "CK" ------------------------------------------------------------------------------------------------------------------    Component Value Date/Time   BNP 15.9 10/26/2021 0817    CBG: Recent Labs  Lab 11/20/21 0547 11/20/21 1130 11/20/21 1750 11/20/21 2319 11/21/21 0540  GLUCAP 121* 248* 135* 272* 97     No results found for this or any previous visit (from the past 240 hour(s)).   Radiology Studies: No results found.   Marzetta Board, MD, PhD [facility]  Between 7 am - 7 pm I am available, please contact me via Amion (for emergencies) or Securechat (non urgent messages)  Between 7 pm - 7 am I am not available, please contact night coverage MD/APP via Amion

## 2021-11-22 LAB — GLUCOSE, CAPILLARY
Glucose-Capillary: 94 mg/dL (ref 70–99)
Glucose-Capillary: 124 mg/dL — ABNORMAL HIGH (ref 70–99)
Glucose-Capillary: 181 mg/dL — ABNORMAL HIGH (ref 70–99)

## 2021-11-22 NOTE — Progress Notes (Signed)
PT Cancellation Note  Patient Details Name: Joshua Donovan MRN: [id number] DOB: [birth year]   Cancelled Treatment:    Reason Eval/Treat Not Completed: Patient declined, no reason specified.  Pt declined am/pm. 11/22/2021  Ginger Carne., PT Acute Rehabilitation Services [phone]  (office)   Tessie Fass [place] Devine 11/22/2021, 5:16 PM

## 2021-11-22 NOTE — Progress Notes (Signed)
NAME:  Joshua Donovan, MRN:  [id number], DOB:  [birth year], LOS: 111 ADMISSION DATE:  08/03/2021, CONSULTATION DATE:  7/26 REFERRING MD:  Colvin Caroli FOR CONSULT:  CVA   History of Present Illness:  66 y/o male presented to [facility] on 7/24 with R hand numbness and weakness, found to have small brainstem stroke in ventral medulla.  Symptoms worsened and required transfer to [facility] for neuro IR intervention where he had a stent placed in R vertebrovasilar junction.  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  He had a tracheostomy performed on 7/26 c/b dislodgement and ENT revision on 8/2.  Pertinent  Medical History  TBI with ICH, DM, HTN, HLD  Significant Hospital Events: Including procedures, antibiotic start and stop dates in addition to other pertinent events    7/24 presented to [facility], ventral medulla CVA 7/25 tx to [facility] 7/26 cerebral angiogram with stent placement to right vertebro-basilar junction, failed extubation, left radial aline MRI brain> mild extension of stroke, now involving bilateral medial medullary, patent basilar artery and R VBJ stent  7/28 underwent bedside percutaneous tracheostomy and PEG tube placement, tolerated well 7/29 no acute issues overnight currently on SBT trial this a.m. and tolerating well, Cleviprex resumed earlier this a.m. due to severe hypertension 7/30 Hypertension persist despite adding oral agents, glucose remains elevated as well 7/31 Bleeding around trach site, bright red blood. Copious bloody secretions. Cleviprex off, SBPs 150s-160s. Basal insulin/TF coverage increased. Cefepime deescalated to ceftriaxone. CXR stable. Later in afternoon, continued peritrach bleeding. Bronchoscopic eval completed with intratracheal bleeding associated with trach. Removed, patient orally reintubated, stoma packed. 8/1 ENT consulted for trach revision. Lightly sedated. Vent full support/PRVC, given fatigue following events of yesterday. ENT attempted to  evaluate trach at bedside, could not pass scope. Plan for OR 8/2. ASA/Brilinta held. 8/2 to OR for trach redo 8/6 trach collar during day shift, back on vent overnight 8/10 Hemoptysis, blood with suctioning > improved 8/11 8/14 on 8L / 35% ATC  8/28: Stable vitals, increased tenacious secretions from trach. CXR suggesting new LLL basilar opacities atelectasis vs pna 09/07/2021 Decreased volume and thickness of secretions. No fevers. Tracheal aspirate from 8/28 with mod gram positive cocci and few gram negative rods today, culture pending. 9/5 start cefazolin x 7 days for MSSA pneumonia 9/8 Trach exchange   Interim History / Subjective:   Still very weak Objective   Blood pressure 127/76, pulse 75, temperature 98 F (36.7 C), temperature source Oral, resp. rate (Abnormal) 26, height '5\' 7"'$  (1.702 m), weight 79.8 kg, SpO2 98 %.    FiO2 (%):  [21 %] 21 %   Intake/Output Summary (Last 24 hours) at 11/22/2021 1427 Last data filed at 11/22/2021 0305 Gross per 24 hour  Intake 855 ml  Output 1200 ml  Net -345 ml   Filed Weights   09/22/21 0459 10/02/21 0500 10/04/21 0500  Weight: 85.7 kg 79.8 kg 79.8 kg    Examination:  General frail 66 year old male resting in bed HENT NCAT trach is midline. Phonation quality poor with endurance for one word phrases at best. Cough very weak and not productive Pulm scattered rhonchi. No accessory use  Card rrr Abd soft  Ext warm dry  Neuro awake. Tries to interact generalized weakness Resolved Hospital Problem list   Enterobacter HCAP 7/28  Assessment & Plan:   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 to R vertebro-basilar junction Acute Hypoxemic Respiratory Failure in setting of  Stroke s/p trach  Discussion He is still too weak to consider decannulation. His respiratory endurance is very poor and cough mechanics too weak  Plan Cont routine trach care Encourage PMV as tolerated during  day, remove at night  At this point he is not candidate for decannulation   Will see weekly  Clementeen Graham, NP [place] for contact info

## 2021-11-22 NOTE — Progress Notes (Signed)
PROGRESS NOTE  Joshua Donovan ONG:[id number] DOB: 04/07/1955 DOA: 08/03/2021 PCP: [facility] [place]   LOS: 111 days   Brief Narrative / Interim history: 66 year old with a history of ICH, DM 2, HTN, and HLD who presented to [facility] 7/24 with right hand numbness and weakness and was diagnosed with a small brainstem stroke.  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  He required intubation and was transferred to [facility] for an interventional radiology intervention.  He ultimately required tracheostomy and PEG tube placement, and has subsequently suffered a prolonged hospital stay.   Significant events: 7/24 presented to [facility], ventral medulla CVA 7/25 tx to [facility], treated with Cleviprex 7/26 cerebral angiogram with stent placement to right vertebro-basilar junction, failed extubation, MRI brain> mild extension of stroke, now involving bilateral medial medullary, patent basilar artery and R VBJ stent  7/28 bedside tracheostomy by PCCM and PEG tube placement by General Surgery 7/31 Bleeding around trach site, bright red blood.  Underwent bronchoscopy, Trach Removed, patient orally reintubated, stoma packed. 8/2 ENT took patient to OR for trach redo 8/9 transferred to medical floor on tracheostomy 8/12 vomiting after bolus feeding with worsening hypoxia 8/13 copious bleeding from the heparin injection site controlled with pressure dressing 8/14 bleeding has resolved.  Trach secretions blood-tinged. 8/15 insurance denied LTAC appeal.  CSW working on SNF. 8/28: peer to peer for LTAC looks like this was denied 8/29: V. tach runs - increased metoprolol  9/2: Slightly increased work of [place] 99 for chest x-ray showed no interval changes 9/5:  Ancef started 2/2 secretions --no wbc or fever 9/12 completed abx course  10/28 completed another antibiotic course Currently Awaiting placement  - [name]- s/p trach  change per RT on 11/3  Subjective / 24h Interval events: No complaints.  Alert.  No significant events overnight  Assesement and Plan: Principal Problem:   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HCC) Active Problems:   Acute respiratory failure with hypoxia (HCC)   Hypertension   Uncontrolled type 2 diabetes mellitus with hyperglycemia (HCC)   Mixed hyperlipidemia   Alcohol abuse   NSVT (nonsustained ventricular tachycardia) (HCC)   Dysphagia   Tracheostomy in place ([place])   HAP (hospital-acquired pneumonia)   Pressure injury of skin   Principal problem 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  Continue supportive care.  Continue PT OT as tolerated, await SNF bed.  -PER MD [name] on 10/20/2021 duration of dual antiplatelet therapy is 6 months starting from 08/04/2021 thereafter aspirin alone.   Active problems Acute respiratory failure with hypoxia (HCC) along with aspiration pneumonia-Patient required ICU admission and mechanical ventilation after admission secondary to worsening mentation. Concern for possible aspiration pneumonitis. Patient unable to extubate. Tracheostomy placed on 7/31 and revised by ENT on 08/11/21 secondary to bleeding. Currently stable. He has finished antibiotic treatment for his infections.  Most recently grew Serratia / enterobacter / staph aureus, and for next antibiotics on 10/28.  -Afebrile   Alcohol abuse - Initially managed on CIWA.  No signs of DTs now, Continue thiamine, multivitamin and folic acid.   Mixed hyperlipidemia - Continue Lipitor.   Hypertension - Continue antihypertensives as below   NSVT (nonsustained ventricular tachycardia) (HCC) EF 60% -on beta-blocker.  Cardiology consulted one-point but not actively following.  Dysphagia -  Patient is s/p PEG tube and is on tube feeds via PEG. Continue tube feeds and free water.     Tracheostomy complication (HCC)-resolved as of 08/25/2021 - In setting of DAPT. ENT consulted for revision. Bleeding resolved.   Hypokalemia and hypomagnesemia -stopped HCTZ which likely is worsening his hypokalemia, still on scheduled potassium supplementation monitor potassium levels closely.  Repeat blood work this morning looks normal potassium and magnesium   Uncontrolled type 2 diabetes mellitus with hyperglycemia (HCC) - Most recent hemoglobin A1C of 8.3%. uncontrolled with hyper- and hypoglycemia.  Levemir along with NovoLog every 4 hours.  CBGs overall acceptable  CBG (last 3)  Recent Labs    11/21/21 1149 11/21/21 2332 11/22/21 0549  GLUCAP 186* 287* 94     Scheduled Meds:  amLODipine  10 mg Per Tube Daily   aspirin  81 mg Per Tube Daily   atorvastatin  80 mg Per Tube Daily   carvedilol  37.5 mg Per Tube BID WC   diclofenac Sodium  2 g Topical QID   famotidine  20 mg Per Tube Daily   feeding supplement ([name])  237 mL Per Tube 6 X Daily   feeding supplement (PROSource TF20)  60 mL Per Tube Daily   folic acid  1 mg Per Tube Daily   free water  100 mL Per Tube Q4H   guaiFENesin  10 mL Per Tube Q4H   insulin aspart  0-9 Units Subcutaneous Q6H   insulin detemir  30 Units Subcutaneous BID   multivitamin with minerals  1 tablet Per Tube Daily   mouth rinse  15 mL Mouth Rinse 4 times per day   potassium chloride  40 mEq Per Tube BID   scopolamine  1 patch Transdermal Q72H   thiamine  100 mg Per Tube Daily   ticagrelor  90 mg Per Tube BID   Continuous Infusions: PRN Meds:.[DISCONTINUED] acetaminophen **OR** acetaminophen (TYLENOL) oral liquid 160 mg/5 mL **OR** [DISCONTINUED] acetaminophen, bisacodyl, hydrALAZINE, labetalol, liver oil-zinc oxide, mouth rinse, polyethylene glycol, sodium chloride  Current Outpatient Medications  Medication Instructions   ACCU-CHEK  FASTCLIX LANCETS MISC Use as directed twice daily dia E11.65    atorvastatin (LIPITOR) 20 mg, Oral, Daily   atorvastatin (LIPITOR) 80 mg, Oral, Daily   Blood Glucose Monitoring Suppl (ACCU-CHEK AVIVA PLUS) w/Device KIT Use as directed   cetirizine (ZYRTEC) 10 mg, Oral, Daily   glucose blood (ACCU-CHEK AVIVA PLUS) test strip Use two times daily to check blood sugar   hydrochlorothiazide (MICROZIDE) 12.5 mg, Oral, Daily   lisinopril (ZESTRIL) 40 mg, Oral, Daily   metFORMIN (GLUCOPHAGE) 1,000 mg, Oral, 2 times daily with meals   metoprolol tartrate (LOPRESSOR) 25 mg, Oral, Daily    Diet Orders (From admission, onward)     Start     Ordered   10/20/21 0819  Diet NPO time specified  Diet effective now       Comments: All medications and diet via feeding tube   10/20/21 0818            DVT prophylaxis: Place TED hose Start: 08/22/21 1546 SCD's Start: 08/03/21 2311   Lab Results  Component Value Date   PLT 361 11/21/2021      Code Status: Full Code  Family Communication: no family at bedside   Status is: Inpatient  Remains inpatient appropriate because: awaiting placement   Level of care: Med-Surg  Objective: Vitals:   11/22/21 0600 11/22/21 0700 11/22/21 0724 11/22/21 0829  BP:   (!) 153/85 (!) 153/85  Pulse: 77 81 82 82  Resp: (!) 21 (!) 26 (!) 26 (!) 27  Temp:   98 F (36.7 C)   TempSrc:   Oral   SpO2: 99% 97% 98% 98%  Weight:      Height:        Intake/Output Summary (Last 24 hours) at 11/22/2021 1030 Last data filed at 11/22/2021 0305 Gross per 24 hour  Intake 855 ml  Output 1200 ml  Net -345 ml    Wt Readings from Last 3 Encounters:  10/04/21 79.8 kg  08/02/21 83.9 kg  02/20/20 88.6 kg    Examination:  Constitutional: No distress Respiratory: No wheezing Cardiovascular: Regular rate and rhythm  Data Reviewed: I have independently reviewed following labs and imaging studies   CBC Recent Labs  Lab 11/17/21 0600 11/21/21 0230  WBC 7.4  8.3  HGB 13.5 12.9*  HCT 42.3 38.7*  PLT 375 361  MCV 82.3 80.3  MCH 26.3 26.8  MCHC 31.9 33.3  RDW 15.7* 15.8*  LYMPHSABS 1.4  --   MONOABS 0.6  --   EOSABS 0.8*  --   BASOSABS 0.0  --      Recent Labs  Lab 11/17/21 0600 11/19/21 0443 11/21/21 0230  NA 142 143 141  K 3.6 3.7 3.5  CL 108 106 106  CO2 _0 GLUCOSE 97 110* 117*  BUN _1 CREATININE 0.50* 0.52* 0.46*  CALCIUM 9.4 9.5 9.2  AST 33  --   --   ALT 62*  --   --   ALKPHOS 39  --   --   BILITOT 0.6  --   --   ALBUMIN 3.2*  --   --   MG 1.6* 1.9 1.7     ------------------------------------------------------------------------------------------------------------------ No results for input(s): "CHOL", "HDL", "LDLCALC", "TRIG", "CHOLHDL", "LDLDIRECT" in the last 72 hours.  Lab Results  Component Value Date   HGBA1C 8.3 (H) 08/03/2021   ------------------------------------------------------------------------------------------------------------------ No results for input(s): "TSH", "T4TOTAL", "T3FREE", "THYROIDAB" in the last 72 hours.  Invalid input(s): "FREET3"  Cardiac Enzymes No results for input(s): "CKMB", "TROPONINI", "MYOGLOBIN" in the last 168 hours.  Invalid input(s): "CK" ------------------------------------------------------------------------------------------------------------------    Component Value Date/Time   BNP 15.9 10/26/2021 0817    CBG: Recent Labs  Lab 11/20/21 2319 11/21/21 0540 11/21/21 1149 11/21/21 2332 11/22/21 0549  GLUCAP 272* 97 186* 287* 94     No results found for this or any previous visit (from the past 240 hour(s)).   Radiology Studies: No results found.   Marzetta Board, MD, PhD [facility]  Between 7 am - 7 pm I am available, please contact me via Amion (for emergencies) or Securechat (non urgent messages)  Between 7 pm - 7 am I am not available, please contact night coverage MD/APP via Amion

## 2021-11-22 NOTE — Progress Notes (Signed)
Nutrition Follow-up  DOCUMENTATION CODES:   Not applicable  INTERVENTION:   Continue bolus tube feeds via PEG: - Jevity 1.5 cal 237 ml (1 carton) 6 times per day - PROSource TF20 60 ml daily - Free water flushes 100 ml q 4 hours  Tube feeding regimen provides 2210 kcal, 111 grams of protein, and 1680 ml of H2O.   - Continue MVI with minerals daily  NUTRITION DIAGNOSIS:   Inadequate oral intake related to acute illness as evidenced by NPO status.  Ongoing, being addressed via TF  GOAL:   Patient will meet greater than or equal to 90% of their needs  Met with TF  MONITOR:   Vent status, Labs, Weight trends, TF tolerance  REASON FOR ASSESSMENT:   Consult Enteral/tube feeding initiation and management  ASSESSMENT:   66 yo male admitted post brain stem stroke involving ventral medulla requiring stent placement to right vertebro-basilar junction. PMH includes HTN, TBI, DM, HLD  Patient remains on trach collar and remains NPO. Pt awaiting placement.  PEG in place. Receiving bolus feedings of Jevity 1.5 cal 1 carton (237 ml) 6 times per day with PROSource TF20 60 ml once daily. Free water flushes 100 ml every 4 hours. Noted pt with episode of emesis on 11/11.  Admit weight: 83.9 kg Last weight: 79.8 kg on 10/04/21  Medications reviewed and include: pepcid, folic acid, SSI q 6 hours, levemir 30 units BID, MVI with minerals, klor-con 40 mEq BID, scopolamine patch, thiamine  Labs reviewed: *** CBG's: *** x 24 hours  UOP: *** ml x 24 hours  Diet Order:   Diet Order             Diet NPO time specified  Diet effective now                   EDUCATION NEEDS:   Not appropriate for education at this time  Skin:  Skin Assessment: Skin Integrity Issues: Stage II: R heel Other: non-pressure wound bilateral buttock  Last BM:  11/13/21  Height:   Ht Readings from Last 1 Encounters:  08/04/21 _0  (1.702 m)    Weight:   Wt Readings from Last 1  Encounters:  10/04/21 79.8 kg    BMI:  Body mass index is 27.57 kg/m.  Estimated Nutritional Needs:   Kcal:  2000-2200 kcals  Protein:  100-115 grams  Fluid:  >/= 2 L   Gustavus Bryant, MS, RD, LDN Inpatient Clinical Dietitian Please see AMiON for contact information.

## 2021-11-23 LAB — GLUCOSE, CAPILLARY
Glucose-Capillary: 178 mg/dL — ABNORMAL HIGH (ref 70–99)
Glucose-Capillary: 199 mg/dL — ABNORMAL HIGH (ref 70–99)
Glucose-Capillary: 205 mg/dL — ABNORMAL HIGH (ref 70–99)
Glucose-Capillary: 205 mg/dL — ABNORMAL HIGH (ref 70–99)
Glucose-Capillary: 222 mg/dL — ABNORMAL HIGH (ref 70–99)
Glucose-Capillary: 69 mg/dL — ABNORMAL LOW (ref 70–99)
Glucose-Capillary: 96 mg/dL (ref 70–99)

## 2021-11-23 LAB — BASIC METABOLIC PANEL
Anion gap: 8 (ref 5–15)
BUN: 11 mg/dL (ref 8–23)
CO2: 24 mmol/L (ref 22–32)
Calcium: 9.3 mg/dL (ref 8.9–10.3)
Chloride: 106 mmol/L (ref 98–111)
Creatinine, Ser: 0.49 mg/dL — ABNORMAL LOW (ref 0.61–1.24)
GFR, Estimated: >60 mL/min (ref >60)
Glucose, Bld: 94 mg/dL (ref 70–99)
Potassium: 3.6 mmol/L (ref 3.5–5.1)
Sodium: 138 mmol/L (ref 135–145)

## 2021-11-23 LAB — MAGNESIUM: Magnesium: 1.6 mg/dL — ABNORMAL LOW (ref 1.7–2.4)

## 2021-11-23 MED ORDER — MAGNESIUM SULFATE 2 GM/50ML IV SOLN
2.0000 g | Freq: Once | INTRAVENOUS | Status: AC
  Administered 2021-11-23: 2 g via INTRAVENOUS
  Filled 2021-11-23: qty 50

## 2021-11-23 NOTE — Progress Notes (Signed)
Physical Therapy Treatment Patient Details Name: Joshua Donovan MRN: [id number] DOB: 19-Jul-1955 Today's Date: 11/23/2021   History of Present Illness Pt is a 66 y.o. male who presented 08/02/21 with R foot pain and R-sided weakness. Transferred to [facility] 7/25. 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 S/p stent placement to the R vertebrobasilar junction 7/26, failed extubation. Trach and PEG placed 7/28. S/p bronchoscopic evaluation, oral reintubation and tracheostomy removal with stoma packing 7/31 due to trach site bleeding. S/p trach revision 8/2. PMH: DM, GERD, TBI with prior ICH, HLD, HTN    PT Comments    Pt initially deferred working with therapy because "they leave me up too long" regarding being in tilt in space w/c. Pt agreeable to sitting EOB. Pt remains to be total with all mobility with no active movement x4 limbs. Pt continues with noted extensor synergy in L UE and LE with movement. Worked on EOB balance and truncal rotation stretches and maintaining active cervical extension t/o 10 min of sitting. Acute PT to cont to follow.    Recommendations for follow up therapy are one component of a multi-disciplinary discharge planning process, led by the attending physician.  Recommendations may be updated based on patient status, additional functional criteria and insurance authorization.  Follow Up Recommendations  Skilled nursing-short term rehab (<3 hours/day) Can patient physically be transported by private vehicle: No   Assistance Recommended at Discharge Frequent or constant Supervision/Assistance  Patient can return  home with the following Assistance with cooking/housework;Two people to help with walking and/or transfers;Two people to help with bathing/dressing/bathroom;Assistance with feeding;Direct supervision/assist for medications management;Direct supervision/assist for financial management;Assist for transportation;Help with stairs or ramp for entrance   Equipment Recommendations  Other (comment)    Recommendations for Other Services       Precautions / Restrictions Precautions Precautions: Fall Precaution Comments: trach collar, PEG, SBP < 180,  PMV on during waking hours if O2 sats are good Restrictions Weight Bearing Restrictions: No     Mobility  Bed Mobility Overal bed mobility: Needs Assistance Bed Mobility: Rolling Rolling: Total assist Sidelying to sit: Total assist Supine to sit: Total assist Sit to supine: Total assist   General bed mobility comments: total A for all mobility    Transfers                   General transfer comment: pt deferred due to "they leave me up too long"    Ambulation/Gait               General Gait Details: unable   Stairs             Wheelchair Mobility    Modified Rankin (Stroke Patients Only) Modified Rankin (Stroke Patients Only) Pre-Morbid Rankin Score: No symptoms Modified Rankin: Severe disability     Balance Overall balance assessment: Needs assistance Sitting-balance support: Feet supported, Bilateral upper extremity supported Sitting balance-Leahy Scale: Zero Sitting balance - Comments: Sat EOB ~12 minute working on sitting balance with eye and  head movements, pt is min A to initiate from neck flexion into neutral with increased trunk extension as he nears more neutral with his neck/head at midline. worked on  truncal rotation stretches, bilat LE PROM, and shld retraction  stretches while EOB Postural control: Left lateral lean, Posterior lean                                  Cognition  Arousal/Alertness: Awake/alert Behavior During Therapy: WFL for tasks assessed/performed Overall Cognitive Status: No family/caregiver present to determine baseline cognitive functioning                                 General Comments: pt with no active movement x4 extremities limiting ability to assess command follow however pt able to follow cervical extension and rotation commands 100% accurately, with verbal cues pt able to verbalize answer to questions with increased time to respond, remains to have flat affect        Exercises General Exercises - Lower Extremity Ankle Circles/Pumps: PROM, Both, 10 reps, Seated (Passive stretch) Long Arc Quad: PROM, Both, 5 reps, Seated Heel Slides: PROM, Both, 10 reps, Supine Other Exercises Other Exercises: truncal rotation to L/R  in sitting, shld retraction    General Comments General comments (skin integrity, edema, etc.): VSS      Pertinent Vitals/Pain Pain Assessment Pain Assessment: No/denies pain    Home Living                          Prior Function            PT Goals (current goals can now be found in the care plan section) Acute Rehab PT Goals PT Goal Formulation: With patient/family Time For Goal Achievement: 11/29/21 Potential to Achieve Goals: Fair Progress towards PT goals: Not progressing toward goals - comment    Frequency    Min 2X/week      PT Plan Current plan remains appropriate    Co-evaluation              AM-PAC PT "6 Clicks" Mobility   Outcome Measure  Help needed turning from your back to your side while in a flat bed without using bedrails?: Total Help needed moving from lying on your back to sitting on the side of a flat bed without using bedrails?: Total Help needed moving to and from a bed to a chair (including a wheelchair)?: Total Help needed standing up from a chair using your arms (e.g., wheelchair or bedside chair)?: Total Help needed to walk in hospital  room?: Total Help needed climbing 3-5 steps with a railing? : Total 6 Click Score: 6    End of Session Equipment Utilized During Treatment: Oxygen (trach collar) Activity Tolerance: Patient tolerated treatment well Patient left: in bed;with bed alarm set;with call bell/phone within reach Nurse Communication: Need for lift equipment;Mobility status PT Visit Diagnosis: Muscle weakness (generalized) (M62.81);Other symptoms and signs involving the nervous system (R29.898);Other abnormalities of gait and mobility (R26.89);Other (comment) Hemiplegia - Right/Left: Right Hemiplegia - dominant/non-dominant: Dominant Hemiplegia - caused by: Cerebral infarction     Time: 1220-1245 PT Time Calculation (min) (ACUTE ONLY): 25 min  Charges:  $Therapeutic Exercise: 8-22 mins $Therapeutic Activity: 8-22 mins                     Kittie Plater, PT, DPT Acute Rehabilitation Services Secure chat preferred Office #: [phone]    Berline Lopes 11/23/2021, 2:41 PM

## 2021-11-23 NOTE — TOC Progression Note (Signed)
Transition of Care [facility]) - Progression Note    Patient Details  Name: Torri Langston MRN: [id number] Date of Birth: [birth year]  Transition of Care [facility]) CM/SW [place], [place] Phone Number: 11/23/2021, 8:52 AM  Clinical Narrative:     CSW called [facility] 2x - left voice message with both admission administrators. Patient has no other bed offers at this time.   Thurmond Butts, MSW, LCSW Clinical Social Worker    Expected Discharge Plan: Skilled Nursing Facility Barriers to Discharge: SNF Pending bed offer  Expected Discharge Plan and Services Expected Discharge Plan: [place]   Discharge Planning Services: CM Consult Post Acute Care Choice: Long Term Acute Care (LTAC) Living arrangements for the past 2 months: Single Family Home                                       Social Determinants of Health (SDOH) Interventions    Readmission Risk Interventions     No data to display

## 2021-11-23 NOTE — Inpatient Diabetes Management (Signed)
Inpatient Diabetes Program Recommendations  AACE/ADA: New Consensus Statement on Inpatient Glycemic Control   Target Ranges:  Prepandial:   less than 140 mg/dL      Peak postprandial:   less than 180 mg/dL (1-2 hours)      Critically ill patients:  140 - 180 mg/dL    Latest Reference Range & Units 11/23/21 00:29 11/23/21 05:33 11/23/21 05:55  Glucose-Capillary 70 - 99 mg/dL 178 (H) 69 (L) 96    Latest Reference Range & Units 11/22/21 05:49 11/22/21 11:56 11/22/21 18:21 11/22/21 23:55  Glucose-Capillary 70 - 99 mg/dL 94 124 (H) 181 (H) 205 (H)   Review of Glycemic Control  Current orders for Inpatient glycemic control: Levemir 30 units BID, Novolog 0-9 units Q6H; Jevity 237 ml 6 times per day  Inpatient Diabetes Program Recommendations:    Insulin: May want to consider decreasing Levemir 27 units BID.  Thanks, Barnie Alderman, RN, MSN, [place] Diabetes Coordinator Inpatient Diabetes Program [phone] (Team Pager from 8am to [place])

## 2021-11-23 NOTE — Progress Notes (Signed)
Speech Language Pathology Treatment: Joshua Donovan Speaking valve  Patient Details Name: Joshua Donovan MRN: [id number] DOB: 02/19/1955 Today's Date: 11/23/2021 Time: 9244-6286 SLP Time Calculation (min) (ACUTE ONLY): 27 min  Assessment / Plan / Recommendation Clinical Impression  Pt demonstrates improvement in ability to initiate voice at the height of his deep breath. He was able to do so counting to three and five, communicating with one word voiced responses and repeating simple social phrases: "Happy Thanks-giving" and "How are you today?" With one breath per word and loudest possible vocal quality. Pt benefited from cues to sigh or breathe through a word. At times of error he seemed to take a deep breath and hold it and try and speak. It was almost like an easy onset cue that helped him improve. Joshua Donovan also completed 5 repetitions on the expiratory muscle strength trainer (dropset: 3 reps at 11 cmH2O, 2 reps at 9 cm H2O). Will continue efforts.    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 He  presented 08/02/21 with R foot pain and R-sided weakness. Transferred to [facility] 7/25. 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 S/p stent placement to the R vertebrobasilar junction 7/26, failed extubation. Trach and PEG placed 7/28. PMH: DM, GERD, TBI with prior ICH, HLD, HTN      SLP Plan  Continue with current plan of care      Recommendations for follow up therapy are one component of a multi-disciplinary discharge planning process, led by the attending physician.  Recommendations may be updated based on patient status,  additional functional criteria and insurance authorization.    Recommendations         Patient may use Passy-Muir Speech Valve: During all waking hours (remove during sleep) PMSV Supervision: Intermittent         Plan: Continue with current plan of care           Joshua Donovan, Joshua Donovan  11/23/2021, 11:59 AM

## 2021-11-23 NOTE — TOC Progression Note (Signed)
Transition of Care [facility]) - Progression Note    Patient Details  Name: Joshua Donovan MRN: [id number] Date of Birth: [birth year]  Transition of Care [facility]) CM/SW [place], [place] Phone Number: 11/23/2021, 11:57 AM  Clinical Narrative:     [facility] -ask for assistance with getting in contact with [facility] Admissions.  Expected Discharge Plan: Skilled Nursing Facility Barriers to Discharge: SNF Pending bed offer  Expected Discharge Plan and Services Expected Discharge Plan: [place]   Discharge Planning Services: CM Consult Post Acute Care Choice: Long Term Acute Care (LTAC) Living arrangements for the past 2 months: Single Family Home                                       Social Determinants of Health (SDOH) Interventions    Readmission Risk Interventions     No data to display

## 2021-11-23 NOTE — Progress Notes (Signed)
PROGRESS NOTE  Joshua Donovan ZOX:[id number] DOB: 1955-04-06 DOA: 08/03/2021 PCP: [facility] [place]   LOS: 112 days   Brief Narrative / Interim history: 66 year old with a history of ICH, DM 2, HTN, and HLD who presented to [facility] 7/24 with right hand numbness and weakness and was diagnosed with a small brainstem stroke.  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  He required intubation and was transferred to [facility] for an interventional radiology intervention.  He ultimately required tracheostomy and PEG tube placement, and has subsequently suffered a prolonged hospital stay.   Significant events: 7/24 presented to [facility], ventral medulla CVA 7/25 tx to [facility], treated with Cleviprex 7/26 cerebral angiogram with stent placement to right vertebro-basilar junction, failed extubation, MRI brain> mild extension of stroke, now involving bilateral medial medullary, patent basilar artery and R VBJ stent  7/28 bedside tracheostomy by PCCM and PEG tube placement by General Surgery 7/31 Bleeding around trach site, bright red blood.  Underwent bronchoscopy, Trach Removed, patient orally reintubated, stoma packed. 8/2 ENT took patient to OR for trach redo 8/9 transferred to medical floor on tracheostomy 8/12 vomiting after bolus feeding with worsening hypoxia 8/13 copious bleeding from the heparin injection site controlled with pressure dressing 8/14 bleeding has resolved.  Trach secretions blood-tinged. 8/15 insurance denied LTAC appeal.  CSW working on SNF. 8/28: peer to peer for LTAC looks like this was denied 8/29: V. tach runs - increased metoprolol  9/2: Slightly increased work of [place] [address] for chest x-ray showed no interval changes 9/5:  Ancef started 2/2 secretions --no wbc or fever 9/12 completed abx course  10/28 completed another antibiotic course Currently Awaiting placement  - [name]- s/p trach  change per RT on 11/3  Subjective / 24h Interval events: Alert, appears comfortable.  No complaints.  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  Continue supportive care.  Continue PT OT as tolerated, await SNF bed.  -PER MD [name] on 10/20/2021 duration of dual antiplatelet therapy is 6 months starting from 08/04/2021 thereafter aspirin alone. -He remained stable for discharge   Active problems Acute respiratory failure with hypoxia ([place]) along with aspiration pneumonia-Patient required ICU admission and mechanical ventilation after admission secondary to worsening mentation. Concern for possible aspiration pneumonitis. Patient unable to extubate. Tracheostomy placed on 7/31 and revised by ENT on 08/11/21 secondary to bleeding. Currently stable. He has finished antibiotic treatment for his infections.  Most recently grew Serratia / enterobacter / staph aureus, and for next antibiotics on 10/28.  -Remains afebrile   Alcohol abuse - Initially managed on CIWA.  No signs of DTs now, Continue thiamine, multivitamin and folic acid.   Mixed hyperlipidemia - Continue Lipitor.   Hypertension - Continue antihypertensives as below   NSVT (nonsustained ventricular tachycardia) (HCC)  EF 60% -on beta-blocker.  Cardiology  consulted one-point but not actively following.   Dysphagia - Patient is s/p PEG tube and is on tube feeds via PEG. Continue tube feeds and free water.     Tracheostomy complication (HCC)-resolved as of 08/25/2021 - In setting of DAPT. ENT consulted for revision. Bleeding resolved.   Hypokalemia and hypomagnesemia -stopped HCTZ which likely is worsening his hypokalemia, still on scheduled potassium supplementation monitor potassium levels closely.  Magnesium low this morning, replete   Uncontrolled type 2 diabetes mellitus with hyperglycemia (HCC) - Most recent hemoglobin A1C of 8.3%. uncontrolled with hyper- and hypoglycemia.  Levemir along with NovoLog every 4 hours.  CBGs overall acceptable  CBG (last 3)  Recent Labs    11/23/21 0029 11/23/21 0533 11/23/21 0555  GLUCAP 178* 69* 96     Scheduled Meds:  amLODipine  10 mg Per Tube Daily   aspirin  81 mg Per Tube Daily   atorvastatin  80 mg Per Tube Daily   carvedilol  37.5 mg Per Tube BID WC   diclofenac Sodium  2 g Topical QID   famotidine  20 mg Per Tube Daily   feeding supplement ([name])  237 mL Per Tube 6 X Daily   feeding supplement (PROSource TF20)  60 mL Per Tube Daily   folic acid  1 mg Per Tube Daily   free water  100 mL Per Tube Q4H   guaiFENesin  10 mL Per Tube Q4H   insulin aspart  0-9 Units Subcutaneous Q6H   insulin detemir  30 Units Subcutaneous BID   multivitamin with minerals  1 tablet Per Tube Daily   mouth rinse  15 mL Mouth Rinse 4 times per day   potassium chloride  40 mEq Per Tube BID   scopolamine  1 patch Transdermal Q72H   thiamine  100 mg Per Tube Daily   ticagrelor  90 mg Per Tube BID   Continuous Infusions: PRN Meds:.[DISCONTINUED] acetaminophen **OR** acetaminophen (TYLENOL) oral liquid 160 mg/5 mL **OR** [DISCONTINUED] acetaminophen, bisacodyl, hydrALAZINE, labetalol, liver oil-zinc oxide, mouth rinse, polyethylene glycol, sodium chloride  Current Outpatient Medications   Medication Instructions   ACCU-CHEK FASTCLIX LANCETS MISC Use as directed twice daily dia E11.65    atorvastatin (LIPITOR) 20 mg, Oral, Daily   atorvastatin (LIPITOR) 80 mg, Oral, Daily   Blood Glucose Monitoring Suppl (ACCU-CHEK AVIVA PLUS) w/Device KIT Use as directed   cetirizine (ZYRTEC) 10 mg, Oral, Daily   glucose blood (ACCU-CHEK AVIVA PLUS) test strip Use two times daily to check blood sugar   hydrochlorothiazide (MICROZIDE) 12.5 mg, Oral, Daily   lisinopril (ZESTRIL) 40 mg, Oral, Daily   metFORMIN (GLUCOPHAGE) 1,000 mg, Oral, 2 times daily with meals   metoprolol tartrate (LOPRESSOR) 25 mg, Oral, Daily    Diet Orders (From admission, onward)     Start     Ordered   10/20/21 0819  Diet NPO time specified  Diet effective now       Comments: All medications and diet via feeding tube   10/20/21 0818            DVT prophylaxis: Place TED hose Start: 08/22/21 1546 SCD's Start: 08/03/21 2311   Lab Results  Component Value Date   PLT 361 11/21/2021      Code Status: Full Code  Family Communication: no family at bedside   Status is: Inpatient  Remains inpatient appropriate because: awaiting placement   Level of care: Med-Surg  Objective: Vitals:  11/23/21 0400 11/23/21 0414 11/23/21 0715 11/23/21 0921  BP:   115/72 128/72  Pulse: 75 74 81 88  Resp: (!) 26 20 (!) 24 (!) 25  Temp:   98.8 F (37.1 C)   TempSrc:   Axillary   SpO2: 97% 97% 98% 98%  Weight:      Height:        Intake/Output Summary (Last 24 hours) at 11/23/2021 1007 Last data filed at 11/23/2021 0535 Gross per 24 hour  Intake --  Output 1400 ml  Net -1400 ml    Wt Readings from Last 3 Encounters:  10/04/21 79.8 kg  08/02/21 83.9 kg  02/20/20 88.6 kg    Examination:  Constitutional: NAD Eyes: lids and conjunctivae normal, no scleral icterus ENMT: mmm Neck: normal, supple Respiratory: clear to auscultation bilaterally, no wheezing, no crackles. Normal respiratory effort.   Cardiovascular: Regular rate and rhythm, no murmurs / rubs / gallops. No LE edema. Abdomen: soft, no distention, no tenderness. Bowel sounds positive.  Skin: no rashes Neurologic: Can move his legs and arms some, using mainly proximal muscles  Data Reviewed: I have independently reviewed following labs and imaging studies   CBC Recent Labs  Lab 11/17/21 0600 11/21/21 0230  WBC 7.4 8.3  HGB 13.5 12.9*  HCT 42.3 38.7*  PLT 375 361  MCV 82.3 80.3  MCH 26.3 26.8  MCHC 31.9 33.3  RDW 15.7* 15.8*  LYMPHSABS 1.4  --   MONOABS 0.6  --   EOSABS 0.8*  --   BASOSABS 0.0  --      Recent Labs  Lab 11/17/21 0600 11/19/21 0443 11/21/21 0230 11/23/21 0347  NA 142 143 141 138  K 3.6 3.7 3.5 3.6  CL 108 106 106 106  CO2 _0 GLUCOSE 97 110* 117* 94  BUN _1 CREATININE 0.50* 0.52* 0.46* 0.49*  CALCIUM 9.4 9.5 9.2 9.3  AST 33  --   --   --   ALT 62*  --   --   --   ALKPHOS 39  --   --   --   BILITOT 0.6  --   --   --   ALBUMIN 3.2*  --   --   --   MG 1.6* 1.9 1.7 1.6*     ------------------------------------------------------------------------------------------------------------------ No results for input(s): "CHOL", "HDL", "LDLCALC", "TRIG", "CHOLHDL", "LDLDIRECT" in the last 72 hours.  Lab Results  Component Value Date   HGBA1C 8.3 (H) 08/03/2021   ------------------------------------------------------------------------------------------------------------------ No results for input(s): "TSH", "T4TOTAL", "T3FREE", "THYROIDAB" in the last 72 hours.  Invalid input(s): "FREET3"  Cardiac Enzymes No results for input(s): "CKMB", "TROPONINI", "MYOGLOBIN" in the last 168 hours.  Invalid input(s): "CK" ------------------------------------------------------------------------------------------------------------------    Component Value Date/Time   BNP 15.9 10/26/2021 0817    CBG: Recent Labs  Lab 11/22/21 1821 11/22/21 2355 11/23/21 0029  11/23/21 0533 11/23/21 0555  GLUCAP 181* 205* 178* 69* 96     No results found for this or any previous visit (from the past 240 hour(s)).   Radiology Studies: No results found.   Marzetta Board, MD, PhD [facility]  Between 7 am - 7 pm I am available, please contact me via Amion (for emergencies) or Securechat (non urgent messages)  Between 7 pm - 7 am I am not available, please contact night coverage MD/APP via Amion

## 2021-11-24 LAB — CBC WITH DIFFERENTIAL/PLATELET
Abs Immature Granulocytes: 0.03 10*3/uL (ref 0.00–0.07)
Basophils Absolute: 0.1 10*3/uL (ref 0.0–0.1)
Basophils Relative: 1 %
Eosinophils Absolute: 0.7 10*3/uL — ABNORMAL HIGH (ref 0.0–0.5)
Eosinophils Relative: 8 %
HCT: 40.7 % (ref 39.0–52.0)
Hemoglobin: 13 g/dL (ref 13.0–17.0)
Immature Granulocytes: 0 %
Lymphocytes Relative: 16 %
Lymphs Abs: 1.3 10*3/uL (ref 0.7–4.0)
MCH: 26.2 pg (ref 26.0–34.0)
MCHC: 31.9 g/dL (ref 30.0–36.0)
MCV: 82.1 fL (ref 80.0–100.0)
Monocytes Absolute: 0.7 10*3/uL (ref 0.1–1.0)
Monocytes Relative: 9 %
Neutro Abs: 5.1 10*3/uL (ref 1.7–7.7)
Neutrophils Relative %: 66 %
Platelets: 350 10*3/uL (ref 150–400)
RBC: 4.96 MIL/uL (ref 4.22–5.81)
RDW: 16.1 % — ABNORMAL HIGH (ref 11.5–15.5)
WBC: 7.9 10*3/uL (ref 4.0–10.5)
nRBC: 0 % (ref 0.0–0.2)

## 2021-11-24 LAB — COMPREHENSIVE METABOLIC PANEL
ALT: 40 U/L (ref 0–44)
AST: 22 U/L (ref 15–41)
Albumin: 3 g/dL — ABNORMAL LOW (ref 3.5–5.0)
Alkaline Phosphatase: 42 U/L (ref 38–126)
Anion gap: 9 (ref 5–15)
BUN: 12 mg/dL (ref 8–23)
CO2: 25 mmol/L (ref 22–32)
Calcium: 9.2 mg/dL (ref 8.9–10.3)
Chloride: 104 mmol/L (ref 98–111)
Creatinine, Ser: 0.49 mg/dL — ABNORMAL LOW (ref 0.61–1.24)
GFR, Estimated: >60 mL/min (ref >60)
Glucose, Bld: 205 mg/dL — ABNORMAL HIGH (ref 70–99)
Potassium: 4 mmol/L (ref 3.5–5.1)
Sodium: 138 mmol/L (ref 135–145)
Total Bilirubin: 0.3 mg/dL (ref 0.3–1.2)
Total Protein: 6.3 g/dL — ABNORMAL LOW (ref 6.5–8.1)

## 2021-11-24 LAB — GLUCOSE, CAPILLARY
Glucose-Capillary: 146 mg/dL — ABNORMAL HIGH (ref 70–99)
Glucose-Capillary: 211 mg/dL — ABNORMAL HIGH (ref 70–99)
Glucose-Capillary: 255 mg/dL — ABNORMAL HIGH (ref 70–99)
Glucose-Capillary: 189 mg/dL — ABNORMAL HIGH (ref 70–99)

## 2021-11-24 LAB — MAGNESIUM: Magnesium: 1.7 mg/dL (ref 1.7–2.4)

## 2021-11-24 MED ORDER — MAGNESIUM SULFATE 2 GM/50ML IV SOLN
2.0000 g | Freq: Once | INTRAVENOUS | Status: AC
  Administered 2021-11-24: 2 g via INTRAVENOUS
  Filled 2021-11-24: qty 50

## 2021-11-24 NOTE — Progress Notes (Signed)
PROGRESS NOTE  Joshua Donovan WFU:[id number] DOB: 1955-02-07 DOA: 08/03/2021 PCP: [facility] [place]   LOS: 113 days   Brief Narrative / Interim history: 66 year old with a history of ICH, DM 2, HTN, and HLD who presented to [facility] 7/24 with right hand numbness and weakness and was diagnosed with a small brainstem stroke.  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  He required intubation and was transferred to [facility] for an interventional radiology intervention.  He ultimately required tracheostomy and PEG tube placement, and has subsequently suffered a prolonged hospital stay.   Significant events: 7/24 presented to [facility], ventral medulla CVA 7/25 tx to [facility], treated with Cleviprex 7/26 cerebral angiogram with stent placement to right vertebro-basilar junction, failed extubation, MRI brain> mild extension of stroke, now involving bilateral medial medullary, patent basilar artery and R VBJ stent  7/28 bedside tracheostomy by PCCM and PEG tube placement by General Surgery 7/31 Bleeding around trach site, bright red blood.  Underwent bronchoscopy, Trach Removed, patient orally reintubated, stoma packed. 8/2 ENT took patient to OR for trach redo 8/9 transferred to medical floor on tracheostomy 8/12 vomiting after bolus feeding with worsening hypoxia 8/13 copious bleeding from the heparin injection site controlled with pressure dressing 8/14 bleeding has resolved.  Trach secretions blood-tinged. 8/15 insurance denied LTAC appeal.  CSW working on SNF. 8/28: peer to peer for LTAC looks like this was denied 8/29: V. tach runs - increased metoprolol  9/2: Slightly increased work of [place] 66 for chest x-ray showed no interval changes 9/5:  Ancef started 2/2 secretions --no wbc or fever 9/12 completed abx course  10/28 completed another antibiotic course Currently Awaiting placement  - [name]- s/p trach  change per RT on 11/3  Subjective / 24h Interval events: Resting today  Assesement and Plan: Principal Problem:   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[place]) Active Problems:   Acute respiratory failure with hypoxia ([place])   Hypertension   Uncontrolled type 2 diabetes mellitus with hyperglycemia ([place])   Mixed hyperlipidemia   Alcohol abuse   NSVT (nonsustained ventricular tachycardia) (HCC)   Dysphagia   Tracheostomy in place ([place])   HAP (hospital-acquired pneumonia)   Pressure injury of skin   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  Continue supportive care.  Continue PT OT as tolerated, await SNF bed.  -PER MD [name] on 10/20/2021 duration of dual antiplatelet therapy is 6 months starting from 08/04/2021 thereafter aspirin alone. -He remained stable for discharge  Acute respiratory failure with hypoxia ([place]) along with aspiration pneumonia-Patient required ICU admission and mechanical ventilation after admission secondary to worsening mentation. Concern for possible aspiration pneumonitis. Patient unable to extubate. Tracheostomy placed on 7/31 and revised by ENT on 08/11/21 secondary to bleeding. Currently stable. He has finished antibiotic treatment for his infections.  Most recently grew Serratia / enterobacter / staph aureus, and for next antibiotics on 10/28.  -Remains afebrile   Alcohol abuse - Initially managed on CIWA.  No signs of DTs now, Continue thiamine, multivitamin and folic acid.   Mixed hyperlipidemia - Continue Lipitor.   Hypertension - Continue antihypertensives as below   NSVT (nonsustained ventricular tachycardia) (HCC) EF 60% -on beta-blocker.  Cardiology consulted one-point but not actively following.   Dysphagia - Patient is  s/p PEG tube and is on tube  feeds via PEG. Continue tube feeds and free water.     Tracheostomy complication (HCC)-resolved as of 08/25/2021 - In setting of DAPT. ENT consulted for revision. Bleeding resolved.   Hypokalemia and hypomagnesemia -stopped HCTZ which likely is worsening his hypokalemia, still on scheduled potassium supplementation monitor potassium levels closely.     Uncontrolled type 2 diabetes mellitus with hyperglycemia (HCC) - Most recent hemoglobin A1C of 8.3%. uncontrolled with hyper- and hypoglycemia.  Levemir along with NovoLog every 4 hours.  CBGs overall acceptable     Scheduled Meds:  amLODipine  10 mg Per Tube Daily   aspirin  81 mg Per Tube Daily   atorvastatin  80 mg Per Tube Daily   carvedilol  37.5 mg Per Tube BID WC   diclofenac Sodium  2 g Topical QID   famotidine  20 mg Per Tube Daily   feeding supplement ([name])  237 mL Per Tube 6 X Daily   feeding supplement (PROSource TF20)  60 mL Per Tube Daily   folic acid  1 mg Per Tube Daily   free water  100 mL Per Tube Q4H   guaiFENesin  10 mL Per Tube Q4H   insulin aspart  0-9 Units Subcutaneous Q6H   insulin detemir  30 Units Subcutaneous BID   multivitamin with minerals  1 tablet Per Tube Daily   mouth rinse  15 mL Mouth Rinse 4 times per day   potassium chloride  40 mEq Per Tube BID   scopolamine  1 patch Transdermal Q72H   thiamine  100 mg Per Tube Daily   ticagrelor  90 mg Per Tube BID   Continuous Infusions: PRN Meds:.[DISCONTINUED] acetaminophen **OR** acetaminophen (TYLENOL) oral liquid 160 mg/5 mL **OR** [DISCONTINUED] acetaminophen, bisacodyl, hydrALAZINE, labetalol, liver oil-zinc oxide, mouth rinse, polyethylene glycol, sodium chloride  Current Outpatient Medications  Medication Instructions   ACCU-CHEK FASTCLIX LANCETS MISC Use as directed twice daily dia E11.65    atorvastatin (LIPITOR) 20 mg, Oral, Daily   atorvastatin (LIPITOR) 80 mg, Oral, Daily   Blood Glucose Monitoring Suppl (ACCU-CHEK AVIVA  PLUS) w/Device KIT Use as directed   cetirizine (ZYRTEC) 10 mg, Oral, Daily   glucose blood (ACCU-CHEK AVIVA PLUS) test strip Use two times daily to check blood sugar   hydrochlorothiazide (MICROZIDE) 12.5 mg, Oral, Daily   lisinopril (ZESTRIL) 40 mg, Oral, Daily   metFORMIN (GLUCOPHAGE) 1,000 mg, Oral, 2 times daily with meals   metoprolol tartrate (LOPRESSOR) 25 mg, Oral, Daily    Diet Orders (From admission, onward)     Start     Ordered   10/20/21 0819  Diet NPO time specified  Diet effective now       Comments: All medications and diet via feeding tube   10/20/21 0818            DVT prophylaxis: Place TED hose Start: 08/22/21 1546 SCD's Start: 08/03/21 2311   Lab Results  Component Value Date   PLT 350 11/24/2021      Code Status: Full Code  Family Communication: no family at bedside   Status is: Inpatient  Remains inpatient appropriate because: awaiting placement   Level of care: Med-Surg  Objective: Vitals:   11/24/21 0324 11/24/21 0419 11/24/21 0724 11/24/21 0830  BP: 133/88  (!) 145/82 (!) 145/82  Pulse: 80 80 81 89  Resp: (!) 21 (!) 27 (!) 21 (!) 24  Temp: 98.1 F (36.7 C)  98.8 F (37.1  C)   TempSrc: Oral  Oral   SpO2: 97% 98% 99% 100%  Weight:      Height:        Intake/Output Summary (Last 24 hours) at 11/24/2021 1012 Last data filed at 11/23/2021 2336 Gross per 24 hour  Intake --  Output 1000 ml  Net -1000 ml   Wt Readings from Last 3 Encounters:  10/04/21 79.8 kg  08/02/21 83.9 kg  02/20/20 88.6 kg    Examination:   General: Appearance:     Overweight male in no acute distress   Trach in place     Heart:    Normal heart rate. Normal rhythm. No murmurs, rubs, or gallops.   MS:   All extremities are intact.   Neurologic:   Awake, alert    Data Reviewed: I have independently reviewed following labs and imaging studies   CBC Recent Labs  Lab 11/21/21 0230 11/24/21 0746  WBC 8.3 7.9  HGB 12.9* 13.0  HCT 38.7* 40.7   PLT 361 350  MCV 80.3 82.1  MCH 26.8 26.2  MCHC 33.3 31.9  RDW 15.8* 16.1*  LYMPHSABS  --  1.3  MONOABS  --  0.7  EOSABS  --  0.7*  BASOSABS  --  0.1    Recent Labs  Lab 11/19/21 0443 11/21/21 0230 11/23/21 0347 11/24/21 0746  NA 143 141 138 138  K 3.7 3.5 3.6 4.0  CL 106 106 106 104  CO2 _0 GLUCOSE 110* 117* 94 205*  BUN _1 CREATININE 0.52* 0.46* 0.49* 0.49*  CALCIUM 9.5 9.2 9.3 9.2  AST  --   --   --  22  ALT  --   --   --  40  ALKPHOS  --   --   --  42  BILITOT  --   --   --  0.3  ALBUMIN  --   --   --  3.0*  MG 1.9 1.7 1.6* 1.7    ------------------------------------------------------------------------------------------------------------------ No results for input(s): "CHOL", "HDL", "LDLCALC", "TRIG", "CHOLHDL", "LDLDIRECT" in the last 72 hours.  Lab Results  Component Value Date   HGBA1C 8.3 (H) 08/03/2021   ------------------------------------------------------------------------------------------------------------------ No results for input(s): "TSH", "T4TOTAL", "T3FREE", "THYROIDAB" in the last 72 hours.  Invalid input(s): "FREET3"  Cardiac Enzymes No results for input(s): "CKMB", "TROPONINI", "MYOGLOBIN" in the last 168 hours.  Invalid input(s): "CK" ------------------------------------------------------------------------------------------------------------------    Component Value Date/Time   BNP 15.9 10/26/2021 0817    CBG: Recent Labs  Lab 11/23/21 0555 11/23/21 1131 11/23/21 1723 11/23/21 2338 11/24/21 0513  GLUCAP 96 199* 222* 205* 146*    No results found for this or any previous visit (from the past 240 hour(s)).   Radiology Studies: No results found.  [name] DO [facility]  Between 7 am - 7 pm I am available, please contact me via Amion (for emergencies) or Securechat (non urgent messages)  Between 7 pm - 7 am I am not available, please contact night coverage MD/APP via Amion

## 2021-11-24 NOTE — Progress Notes (Signed)
Occupational Therapy Treatment Patient Details Name: Joshua Donovan MRN: [id number] DOB: 14-May-1955 Today's Date: 11/24/2021   History of present illness Pt is a 66 y.o. male who presented 08/02/21 with R foot pain and R-sided weakness. Transferred to [facility] 7/25. 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 S/p stent placement to the R vertebrobasilar junction 7/26, failed extubation. Trach and PEG placed 7/28. S/p bronchoscopic evaluation, oral reintubation and tracheostomy removal with stoma packing 7/31 due to trach site bleeding. S/p trach revision 8/2. PMH: DM, GERD, TBI with prior ICH, HLD, HTN   OT comments  Pt still making slow progress with OT at this time.  Modified treatment plan to minimum of 1X per week based on progress.  Requires total assist for bed mobility with total +2 for supine to sit and for squat pivot transfers to the wheelchair.  Decreased head and trunk control still present with some increasing extensor tone in trunk, UEs, and LEs.  No active movement seen in arms or hands at this time.  Still with increased swelling in hands and limitations in PROM digit flexion bilaterally and in extension at IPs on the left hand.  Will continue to follow acutely with anticipation of SNF for follow-up therapy.     Recommendations for follow up therapy are one component of a multi-disciplinary discharge planning process, led by the attending physician.  Recommendations may be updated based on patient status, additional functional criteria and insurance authorization.    Follow Up Recommendations  Skilled nursing-short term rehab  (<3 hours/day)     Assistance Recommended at Discharge Frequent or constant Supervision/Assistance  Patient can return home with the following  Two people to help with walking and/or transfers;Two people to help with bathing/dressing/bathroom;Assistance with cooking/housework;Assistance with feeding;Direct supervision/assist for medications management;Direct supervision/assist for financial management;Assist for transportation;Help with stairs or ramp for entrance   Equipment Recommendations  Other (comment)       Precautions / Restrictions Precautions Precautions: Fall Precaution Comments: trach collar, PEG, SBP < 180,  PMV on during waking hours if O2 sats are good Restrictions Weight Bearing Restrictions: No       Mobility Bed Mobility Overal bed mobility: Needs Assistance Bed Mobility: Rolling Rolling: Total assist Sidelying to sit: Total assist, +2 for physical assistance       General bed mobility comments: Pt needs total assist to total +2 for all aspects of rolling and transitions to sitting.    Transfers Overall transfer level: Needs assistance Equipment used: None Transfers: Bed to chair/wheelchair/BSC     Squat pivot transfers: Total assist, +2 physical assistance             Balance Overall balance assessment: Needs assistance Sitting-balance support: Feet supported, Bilateral upper extremity supported Sitting balance-Leahy Scale: Zero                                     ADL either performed or assessed with clinical judgement   ADL Overall ADL's : Needs assistance/impaired  Toileting- Clothing Manipulation and Hygiene: Total assistance;Bed level;+2 for safety/equipment Toileting - Clothing Manipulation Details (indicate cue type and reason): total assist for toilet hygiene in sidelying secondary to primo fit leaking and bowel incontinence.     Functional mobility during ADLs: Total  assistance General ADL Comments: Therapist completed BUE stretching in supine for BUEs.  See Extremity section for details.  Completed supine to sit EOB with total assist and then transferred scoot pivot to the tilt in space wheelchair at total assist +2 as well.  Nursing made aware to lift pt back to bed after one hour.  Pt positioned with UEs supported and head a midline and chair tilted.  Oxygen sats stable at 98% on 5Ls medical air.    Extremity/Trunk Assessment Upper Extremity Assessment Upper Extremity Assessment: RUE deficits/detail;LUE deficits/detail RUE Deficits / Details: Shoulder flexion PROM 0-90 degrees with no attempts to go further secondary to pain beginning.  Elbow flexion with some pain at 100 degrees and PROM available 0-120.  Increased tightness in digit flexion with stretching and joint distraction mobilizations completed at MPs and IPs.  PROM MP flexion 0-60 degrees roughly and IP 0-80.  Increased tightness noted in hand as well as still with increased edema.  Pain noted with PROM so limited within tolerance. RUE: Shoulder pain with ROM RUE Sensation: decreased light touch RUE Coordination: decreased fine motor;decreased gross motor LUE Deficits / Details: Completed shoulder flexion PROM 0-90 degrees with slight end range pain.  Elbow flexion 0-110 degrees with pain as well but improved with increased reps of 10 for 1 set.  Completed joint mobilizations and stretching to the IP joints as pt exhibits  contracturse in slight flexion of 10-20 degrees of digits 2-5.  Increased tightness in MP and IP flexion as well but able to complete stretching to achieve 90% of  full gross digit flexion. LUE: Shoulder pain with ROM LUE Sensation: decreased light touch LUE Coordination: decreased fine motor;decreased gross motor             Cognition Arousal/Alertness: Awake/alert Behavior During Therapy: WFL for tasks assessed/performed Overall Cognitive Status: No family/caregiver  present to determine baseline cognitive functioning                     Current Attention Level: Sustained           General Comments: Pt attempts to answer questions appropriately but is limted secondary to trach.  Even with PMV in place speech volume and inteligibility is still limited.                   Pertinent Vitals/ Pain       Pain Assessment Pain Assessment: Faces Faces Pain Scale: Hurts a little bit Pain Location: with BUE PROM exercises and stretching Pain Descriptors / Indicators: Grimacing Pain Intervention(s): Limited activity within patient's tolerance, Monitored during session, Repositioned         Frequency  Min 1X/week        Progress Toward Goals  OT Goals(current goals can now be found in the care plan section)  Progress towards OT goals: Not progressing toward goals - comment  Acute Rehab OT Goals Patient Stated Goal: Pt agreeable to working on PROM and OOB to wheelchair. OT Goal Formulation: With patient Time For Goal Achievement: 12/03/21 Potential to Achieve Goals: [place] Discharge plan needs to be updated       AM-PAC OT "6 Clicks" Daily Activity     Outcome Measure  Help from another person eating meals?: Total Help from another person taking care of personal grooming?: Total Help from another person toileting, which includes using toliet, bedpan, or urinal?: Total Help from another person bathing (including washing, rinsing, drying)?: Total Help from another person to put on and taking off regular upper body clothing?: Total Help from another person to put on and taking off regular lower body clothing?: Total 6 Click Score: 6    End of Session Equipment Utilized During Treatment: Oxygen  OT Visit Diagnosis: Other symptoms and signs involving the nervous system (R29.898);Muscle weakness (generalized) (M62.81);Feeding difficulties (R63.3);Other abnormalities of gait and mobility (R26.89);Unsteadiness on feet  (R26.81);Other (comment);Pain Pain - Right/Left:  (BUEs) Pain - part of body: Arm   Activity Tolerance Patient tolerated treatment well   Patient Left in chair;with call bell/phone within reach;with chair alarm set   Nurse Communication Mobility status;Need for lift equipment        Time: 1055-1200 OT Time Calculation (min): 65 min  Charges: OT General Charges $OT Visit: 1 Visit OT Treatments $Self Care/Home Management : 8-22 mins $Therapeutic Activity: 8-22 mins $Therapeutic Exercise: 23-37 mins  Lanai Conlee OTR/L 11/24/2021, 2:23 PM

## 2021-11-25 LAB — GLUCOSE, CAPILLARY
Glucose-Capillary: 103 mg/dL — ABNORMAL HIGH (ref 70–99)
Glucose-Capillary: 161 mg/dL — ABNORMAL HIGH (ref 70–99)
Glucose-Capillary: 252 mg/dL — ABNORMAL HIGH (ref 70–99)
Glucose-Capillary: 234 mg/dL — ABNORMAL HIGH (ref 70–99)

## 2021-11-25 NOTE — Progress Notes (Signed)
PROGRESS NOTE  Joshua Donovan UJW:[id number] DOB: 1955/12/07 DOA: 08/03/2021 PCP: [facility]   LOS: 114 days   Brief Narrative / Interim history: 66 year old with a history of ICH, DM 2, HTN, and HLD who presented to [facility] 7/24 with right hand numbness and weakness and was diagnosed with a small brainstem stroke.  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  He required intubation and was transferred to [facility] for an interventional radiology intervention.  He ultimately required tracheostomy and PEG tube placement, and has subsequently suffered a prolonged hospital stay.   Significant events: 7/24 presented to [facility], ventral medulla CVA 7/25 tx to [facility], treated with Cleviprex 7/26 cerebral angiogram with stent placement to right vertebro-basilar junction, failed extubation, MRI brain> mild extension of stroke, now involving bilateral medial medullary, patent basilar artery and R VBJ stent  7/28 bedside tracheostomy by PCCM and PEG tube placement by General Surgery 7/31 Bleeding around trach site, bright red blood.  Underwent bronchoscopy, Trach Removed, patient orally reintubated, stoma packed. 8/2 ENT took patient to OR for trach redo 8/9 transferred to medical floor on tracheostomy 8/12 vomiting after bolus feeding with worsening hypoxia 8/13 copious bleeding from the heparin injection site controlled with pressure dressing 8/14 bleeding has resolved.  Trach secretions blood-tinged. 8/15 insurance denied LTAC appeal.  CSW working on SNF. 8/28: peer to peer for LTAC looks like this was denied 8/29: V. 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[place]) Active Problems:   Acute respiratory failure with hypoxia ([name])   Hypertension   Uncontrolled type 2 diabetes mellitus with hyperglycemia (HCC)   Mixed hyperlipidemia   Alcohol abuse   NSVT (nonsustained ventricular tachycardia) (HCC)   Dysphagia   Tracheostomy in place ([name])   HAP (hospital-acquired pneumonia)   Pressure injury of skin   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  Continue supportive care.  Continue PT OT as tolerated, await SNF bed.  -PER MD [name] on 10/20/2021 duration of dual antiplatelet therapy is 6 months starting from 08/04/2021 thereafter aspirin alone. -He remained stable for discharge  Acute respiratory failure with hypoxia ([name]) along with aspiration pneumonia-Patient required ICU admission and mechanical ventilation after admission secondary to worsening mentation. Concern for possible aspiration pneumonitis. Patient unable to extubate. Tracheostomy placed on 7/31 and revised by ENT on 08/11/21 secondary to bleeding. Currently stable. He has finished antibiotic treatment for his infections.  Most recently grew Serratia / enterobacter / staph aureus, and for next antibiotics on 10/28.  -Remains afebrile   Alcohol abuse - Initially managed on CIWA.  No signs of DTs now, Continue thiamine, multivitamin and folic acid.   Mixed hyperlipidemia - Continue Lipitor.   Hypertension - Continue antihypertensives as below   NSVT (nonsustained ventricular tachycardia) (HCC) EF 60% -on beta-blocker.  Cardiology consulted one-point but not actively following.   Dysphagia - Patient  is s/p PEG tube and is on  tube feeds via PEG. Continue tube feeds and free water.     Tracheostomy complication (HCC)-resolved as of 08/25/2021 - In setting of DAPT. ENT consulted for revision. Bleeding resolved.   Hypokalemia and hypomagnesemia -stopped HCTZ which likely is worsening his hypokalemia, still on scheduled potassium supplementation monitor potassium levels closely.     Uncontrolled type 2 diabetes mellitus with hyperglycemia (HCC) - Most recent hemoglobin A1C of 8.3%. uncontrolled with hyper- and hypoglycemia.  Levemir along with NovoLog every 4 hours.  CBGs overall acceptable     Scheduled Meds:  amLODipine  10 mg Per Tube Daily   aspirin  81 mg Per Tube Daily   atorvastatin  80 mg Per Tube Daily   carvedilol  37.5 mg Per Tube BID WC   diclofenac Sodium  2 g Topical QID   famotidine  20 mg Per Tube Daily   feeding supplement ([name])  237 mL Per Tube 6 X Daily   feeding supplement (PROSource TF20)  60 mL Per Tube Daily   folic acid  1 mg Per Tube Daily   free water  100 mL Per Tube Q4H   guaiFENesin  10 mL Per Tube Q4H   insulin aspart  0-9 Units Subcutaneous Q6H   insulin detemir  30 Units Subcutaneous BID   multivitamin with minerals  1 tablet Per Tube Daily   mouth rinse  15 mL Mouth Rinse 4 times per day   potassium chloride  40 mEq Per Tube BID   scopolamine  1 patch Transdermal Q72H   thiamine  100 mg Per Tube Daily   ticagrelor  90 mg Per Tube BID   Continuous Infusions: PRN Meds:.[DISCONTINUED] acetaminophen **OR** acetaminophen (TYLENOL) oral liquid 160 mg/5 mL **OR** [DISCONTINUED] acetaminophen, bisacodyl, hydrALAZINE, labetalol, liver oil-zinc oxide, mouth rinse, polyethylene glycol, sodium chloride  Current Outpatient Medications  Medication Instructions   ACCU-CHEK FASTCLIX LANCETS MISC Use as directed twice daily dia E11.65    atorvastatin (LIPITOR) 20 mg, Oral, Daily   atorvastatin (LIPITOR) 80 mg, Oral, Daily   Blood Glucose Monitoring Suppl (ACCU-CHEK AVIVA  PLUS) w/Device KIT Use as directed   cetirizine (ZYRTEC) 10 mg, Oral, Daily   glucose blood (ACCU-CHEK AVIVA PLUS) test strip Use two times daily to check blood sugar   hydrochlorothiazide (MICROZIDE) 12.5 mg, Oral, Daily   lisinopril (ZESTRIL) 40 mg, Oral, Daily   metFORMIN (GLUCOPHAGE) 1,000 mg, Oral, 2 times daily with meals   metoprolol tartrate (LOPRESSOR) 25 mg, Oral, Daily    Diet Orders (From admission, onward)     Start     Ordered   10/20/21 0819  Diet NPO time specified  Diet effective now       Comments: All medications and diet via feeding tube   10/20/21 0818            DVT prophylaxis: Place TED hose Start: 08/22/21 1546 SCD's Start: 08/03/21 2311   Lab Results  Component Value Date   PLT 350 11/24/2021      Code Status: Full Code  Family Communication: no family at bedside   Status is: Inpatient  Remains inpatient appropriate because: awaiting placement   Level of care: Med-Surg  Objective: Vitals:   11/25/21 0317 11/25/21 0434 11/25/21 0854 11/25/21 0906  BP: (!) 143/84  (!) 139/92   Pulse: 84  89 91  Resp: 20  (!) 24 (!) 23  Temp: 98.2 F (36.8 C)  99 F (37.2 C)  TempSrc: Oral  Oral   SpO2: 95% 96% 95% 96%  Weight:      Height:        Intake/Output Summary (Last 24 hours) at 11/25/2021 1021 Last data filed at 11/25/2021 0547 Gross per 24 hour  Intake --  Output 1100 ml  Net -1100 ml   Wt Readings from Last 3 Encounters:  10/04/21 79.8 kg  08/02/21 83.9 kg  02/20/20 88.6 kg    Examination:    General: Appearance:     Overweight male in no acute distress     Lungs:     respirations unlabored  Heart:    Normal heart rate.   MS:   All extremities are intact.   Neurologic:   Awake, alert     Data Reviewed: I have independently reviewed following labs and imaging studies   CBC Recent Labs  Lab 11/21/21 0230 11/24/21 0746  WBC 8.3 7.9  HGB 12.9* 13.0  HCT 38.7* 40.7  PLT 361 350  MCV 80.3 82.1  MCH 26.8 26.2   MCHC 33.3 31.9  RDW 15.8* 16.1*  LYMPHSABS  --  1.3  MONOABS  --  0.7  EOSABS  --  0.7*  BASOSABS  --  0.1    Recent Labs  Lab 11/19/21 0443 11/21/21 0230 11/23/21 0347 11/24/21 0746  NA 143 141 138 138  K 3.7 3.5 3.6 4.0  CL 106 106 106 104  CO2 _0 GLUCOSE 110* 117* 94 205*  BUN _1 CREATININE 0.52* 0.46* 0.49* 0.49*  CALCIUM 9.5 9.2 9.3 9.2  AST  --   --   --  22  ALT  --   --   --  40  ALKPHOS  --   --   --  42  BILITOT  --   --   --  0.3  ALBUMIN  --   --   --  3.0*  MG 1.9 1.7 1.6* 1.7    ------------------------------------------------------------------------------------------------------------------ No results for input(s): "CHOL", "HDL", "LDLCALC", "TRIG", "CHOLHDL", "LDLDIRECT" in the last 72 hours.  Lab Results  Component Value Date   HGBA1C 8.3 (H) 08/03/2021   ------------------------------------------------------------------------------------------------------------------ No results for input(s): "TSH", "T4TOTAL", "T3FREE", "THYROIDAB" in the last 72 hours.  Invalid input(s): "FREET3"  Cardiac Enzymes No results for input(s): "CKMB", "TROPONINI", "MYOGLOBIN" in the last 168 hours.  Invalid input(s): "CK" ------------------------------------------------------------------------------------------------------------------    Component Value Date/Time   BNP 15.9 10/26/2021 0817    CBG: Recent Labs  Lab 11/24/21 0513 11/24/21 1210 11/24/21 1819 11/24/21 2321 11/25/21 0536  GLUCAP 146* 211* 255* 189* 103*    No results found for this or any previous visit (from the past 240 hour(s)).   Radiology Studies: No results found.  [name] DO [facility]  Between 7 am - 7 pm I am available, please contact me via Amion (for emergencies) or Securechat (non urgent messages)  Between 7 pm - 7 am I am not available, please contact night coverage MD/APP via Amion

## 2021-11-25 NOTE — TOC Progression Note (Signed)
Transition of Care [facility]) - Progression Note    Patient Details  Name: Joshua Donovan MRN: [id number] Date of Birth: 05-16-1955  Transition of Care [facility]) CM/SW [place], [place] Phone Number: 11/25/2021, 12:40 PM  Clinical Narrative:     [facility] in [place]  has started insurance authorization- Family is aware and confirms disposition plan.   TOC will continue to follow and assist with discharge planning.  Thurmond Butts, MSW, LCSW Clinical Social Worker    Expected Discharge Plan: Skilled Nursing Facility Barriers to Discharge: SNF Pending bed offer  Expected Discharge Plan and Services Expected Discharge Plan: [place]   Discharge Planning Services: CM Consult Post Acute Care Choice: Long Term Acute Care (LTAC) Living arrangements for the past 2 months: Single Family Home                                       Social Determinants of Health (SDOH) Interventions    Readmission Risk Interventions     No data to display

## 2021-11-26 LAB — GLUCOSE, CAPILLARY
Glucose-Capillary: 106 mg/dL — ABNORMAL HIGH (ref 70–99)
Glucose-Capillary: 216 mg/dL — ABNORMAL HIGH (ref 70–99)

## 2021-11-26 MED ORDER — POLYETHYLENE GLYCOL 3350 17 G PO PACK: 17.0000 g | PACK | Freq: Two times a day (BID) | ORAL | 0 refills | Status: DC | PRN

## 2021-11-26 MED ORDER — BISACODYL 10 MG RE SUPP: 10.0000 mg | Freq: Every day | RECTAL | 0 refills | Status: DC | PRN

## 2021-11-26 MED ORDER — TICAGRELOR 90 MG PO TABS: 90.0000 mg | ORAL_TABLET | Freq: Two times a day (BID) | ORAL | Status: DC

## 2021-11-26 MED ORDER — FREE WATER: 100.0000 mL | Status: DC

## 2021-11-26 MED ORDER — SCOPOLAMINE 1 MG/3DAYS TD PT72: 1.0000 | MEDICATED_PATCH | TRANSDERMAL | 12 refills | Status: DC

## 2021-11-26 MED ORDER — FOLIC ACID 1 MG PO TABS: 1.0000 mg | ORAL_TABLET | Freq: Every day | ORAL | Status: DC

## 2021-11-26 MED ORDER — AMLODIPINE BESYLATE 10 MG PO TABS: 10.0000 mg | ORAL_TABLET | Freq: Every day | ORAL | Status: DC

## 2021-11-26 MED ORDER — PROSOURCE TF20 ENFIT COMPATIBL EN LIQD: 60.0000 mL | Freq: Every day | ENTERAL | Status: DC

## 2021-11-26 MED ORDER — ADULT MULTIVITAMIN W/MINERALS CH: 1.0000 | ORAL_TABLET | Freq: Every day | ORAL | Status: DC

## 2021-11-26 MED ORDER — JEVITY 1.5 CAL/FIBER PO LIQD: 237.0000 mL | Freq: Every day | ORAL | Status: DC

## 2021-11-26 MED ORDER — POTASSIUM CHLORIDE 20 MEQ PO PACK: 40.0000 meq | PACK | Freq: Every day | ORAL | Status: DC

## 2021-11-26 MED ORDER — FAMOTIDINE 40 MG/5ML PO SUSR: 20.0000 mg | Freq: Every day | ORAL | 0 refills | Status: DC

## 2021-11-26 MED ORDER — ASPIRIN 81 MG PO CHEW: 81.0000 mg | CHEWABLE_TABLET | Freq: Every day | ORAL | Status: DC

## 2021-11-26 MED ORDER — CARVEDILOL 12.5 MG PO TABS: 37.5000 mg | ORAL_TABLET | Freq: Two times a day (BID) | ORAL | Status: DC

## 2021-11-26 MED ORDER — GUAIFENESIN 100 MG/5ML PO LIQD: 10.0000 mL | ORAL | 0 refills | Status: DC

## 2021-11-26 MED ORDER — INSULIN ASPART 100 UNIT/ML IJ SOLN: 0.0000 [IU] | Freq: Four times a day (QID) | INTRAMUSCULAR | 11 refills | Status: DC

## 2021-11-26 MED ORDER — ORAL CARE MOUTH RINSE: 15.0000 mL | Freq: Four times a day (QID) | OROMUCOSAL | 0 refills | Status: DC

## 2021-11-26 MED ORDER — ACETAMINOPHEN 160 MG/5ML PO SOLN: 650.0000 mg | ORAL | 0 refills | Status: DC | PRN

## 2021-11-26 MED ORDER — INSULIN DETEMIR 100 UNIT/ML ~~LOC~~ SOLN: 30.0000 [IU] | Freq: Two times a day (BID) | SUBCUTANEOUS | 11 refills | Status: DC

## 2021-11-26 MED ORDER — ZINC OXIDE 40 % EX OINT: TOPICAL_OINTMENT | CUTANEOUS | 0 refills | Status: DC | PRN

## 2021-11-26 MED ORDER — THIAMINE HCL 100 MG PO TABS: 100.0000 mg | ORAL_TABLET | Freq: Every day | ORAL | Status: DC

## 2021-11-26 NOTE — Care Management Important Message (Signed)
Important Message  Patient Details  Name: Joshua Donovan MRN: [id number] Date of Birth: [birth year]   Medicare Important Message Given:  Yes  Tried to call the patient at him cell phone and to the room to remind him of the right to appeal .  Will mail IM to home address. t   Orbie Pyo 11/26/2021, 3:14 PM

## 2021-11-26 NOTE — Progress Notes (Signed)
NAME:  Joshua Donovan, MRN:  [id number], DOB:  06/14/1955, LOS: 28 ADMISSION DATE:  08/03/2021, CONSULTATION DATE:  7/26 REFERRING MD:  Colvin Caroli FOR CONSULT:  CVA   History of Present Illness:  66 y/o male presented to [facility] on 7/24 with R hand numbness and weakness, found to have small brainstem stroke in ventral medulla.  Symptoms worsened and required transfer to [facility] for neuro IR intervention where he had a stent placed in R vertebrovasilar junction.  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  He had a tracheostomy performed on 7/26 c/b dislodgement and ENT revision on 8/2.  Pertinent  Medical History  TBI with ICH, DM, HTN, HLD  Significant Hospital Events: Including procedures, antibiotic start and stop dates in addition to other pertinent events    7/24 presented to [facility], ventral medulla CVA 7/25 tx to [facility] 7/26 cerebral angiogram with stent placement to right vertebro-basilar junction, failed extubation, left radial aline MRI brain> mild extension of stroke, now involving bilateral medial medullary, patent basilar artery and R VBJ stent  7/28 underwent bedside percutaneous tracheostomy and PEG tube placement, tolerated well 7/29 no acute issues overnight currently on SBT trial this a.m. and tolerating well, Cleviprex resumed earlier this a.m. due to severe hypertension 7/30 Hypertension persist despite adding oral agents, glucose remains elevated as well 7/31 Bleeding around trach site, bright red blood. Copious bloody secretions. Cleviprex off, SBPs 150s-160s. Basal insulin/TF coverage increased. Cefepime deescalated to ceftriaxone. CXR stable. Later in afternoon, continued peritrach bleeding. Bronchoscopic eval completed with intratracheal bleeding associated with trach. Removed, patient orally reintubated, stoma packed. 8/1 ENT consulted for trach revision. Lightly sedated. Vent full support/PRVC, given fatigue following events of yesterday. ENT attempted to  evaluate trach at bedside, could not pass scope. Plan for OR 8/2. ASA/Brilinta held. 8/2 to OR for trach redo 8/6 trach collar during day shift, back on vent overnight 8/10 Hemoptysis, blood with suctioning > improved 8/11 8/14 on 8L / 35% ATC  8/28: Stable vitals, increased tenacious secretions from trach. CXR suggesting new LLL basilar opacities atelectasis vs pna 09/07/2021 Decreased volume and thickness of secretions. No fevers. Tracheal aspirate from 8/28 with mod gram positive cocci and few gram negative rods today, culture pending. 9/5 start cefazolin x 7 days for MSSA pneumonia 9/8 Trach exchange 11/26/2021 evaluated for possible transfer to skilled nursing facility in [place].     Interim History / Subjective:   Still not a candidate for capping trials. Objective   Blood pressure (!) 165/92, pulse 85, temperature 98.6 F (37 C), temperature source Oral, resp. rate (!) 26, height '5\' 7"'$  (1.702 m), weight 79.8 kg, SpO2 98 %.    FiO2 (%):  [21 %] 21 %   Intake/Output Summary (Last 24 hours) at 11/26/2021 1029 Last data filed at 11/26/2021 0100 Gross per 24 hour  Intake --  Output 950 ml  Net -950 ml   Filed Weights   09/22/21 0459 10/02/21 0500 10/04/21 0500  Weight: 85.7 kg 79.8 kg 79.8 kg    Examination: General: Middle-age male extremely weak HEENT: MM pink/moist, tracheostomy is placed in recannulize and tracheostomy is midline copious oral secretions extremely weak cough. 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  He is skilled nursing facility setting he can be attempted.  Tracheostomy when stronger and follow usual decannulation.  But again he is not ready for decannulation at this time 11/26/2021  Plan Continue trach care no capping at this time Passy-Muir valve as tolerated Pulmonary toilet.  Richardson Landry Rolonda Pontarelli ACNP Acute Care Nurse Practitioner [place] Please consult Amion 11/26/2021, 10:29 AM

## 2021-11-26 NOTE — Progress Notes (Addendum)
Speech Language Pathology Treatment: Joshua Donovan Speaking valve  Patient Details Name: Joshua Donovan MRN: [id number] DOB: 12/03/1955 Today's Date: 11/26/2021 Time:  -     Assessment / Plan / Recommendation Clinical Impression  Joshua Donovan likely to discharge today per Education officer, museum. Joshua Donovan is able to speak audibly and clearly with one word responses today. Volume with short phrases is still poor and cueing needed. Joshua Donovan continues to tolerate his PMSV all waking hours with intermittent supervision. He has remained stable over the last month, but breath support for speech has not improved and carry over for strategies has not improved despite repeated training. He was largely unsuccessful with attempt to use eye gaze device for communication as well. He is still largely unintelligible at conversation level with staff but can express basic wants and needs. His daughters and Elo expressed interest yesterday in repeating swallowing testing. Joshua Donovan had previously completed a FEES and found to have severe dysphagia mostly due to very poor breath support to clear airway when needed. Subsequent efforts focused on breath support but again there was not significant progress to warrant PO trials and pt was struggling with recurrent pna and increased secretions. Given recent stability with secretions and no infection, plan for a repeat FEES later this am to reassess swallow function prior to d/c.   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 He  presented 08/02/21 with R foot pain and R-sided weakness. Transferred to [facility] 7/25. MRI 7/26 revealed interval expansion of previously identified ventral medullary infarct, extending posteriorly to traverse the medulla to the floor of the fourth ventricle, new scattered  small volume ischemic infarcts involving the right cerebellum as well as the cortical aspects of the right greater than left occipital lobes, and single punctate focus of associated petechial hemorrhage at  the right cerebellum. S/p stent placement to the R vertebrobasilar junction 7/26, failed extubation. Trach and PEG placed 7/28. PMH: DM, GERD, TBI with prior ICH, HLD, HTN      SLP Plan         Recommendations for follow up therapy are one component of a multi-disciplinary discharge planning process, led by the attending physician.  Recommendations may be updated based on patient status, additional functional criteria and insurance authorization.    Recommendations                               Joshua Donovan, Joshua Donovan  11/26/2021, 11:10 AM

## 2021-11-26 NOTE — Procedures (Signed)
Objective Swallowing Evaluation: Type of Study: FEES-Fiberoptic Endoscopic Evaluation of Swallow   Patient Details  Name: Joshua Donovan MRN: [id number] Date of Birth: December 09, 1955  Today's Date: 11/26/2021 Time: SLP Start Time (ACUTE ONLY): 88 -SLP Stop Time (ACUTE ONLY): 1200  SLP Time Calculation (min) (ACUTE ONLY): 30 min   Past Medical History:  Past Medical History:  Diagnosis Date   Diabetes mellitus without complication ([place])    GERD (gastroesophageal reflux disease)    Hemorrhage in the brain ([place]) 08/2015   High cholesterol    Hyperlipidemia    Hypertension    Hypertensive emergency 08/03/2021   Past Surgical History:  Past Surgical History:  Procedure Laterality Date   COLONOSCOPY WITH PROPOFOL N/A 09/08/2017   Procedure: COLONOSCOPY WITH PROPOFOL;  Surgeon: Lucilla Lame, MD;  Location: [facility];  Service: Endoscopy;  Laterality: N/A;  Diabetic - oral meds   IR ANGIO INTRA EXTRACRAN SEL COM CAROTID INNOMINATE BILAT MOD SED  08/04/2021   IR ANGIO VERTEBRAL SEL SUBCLAVIAN INNOMINATE UNI L MOD SED  08/04/2021   IR ANGIO VERTEBRAL SEL VERTEBRAL UNI R MOD SED  08/04/2021   IR CT HEAD LTD  08/04/2021   IR INTRA CRAN STENT  08/04/2021   IR US GUIDE VASC ACCESS RIGHT  08/04/2021   partial intestine removed     approx date: 1980   POLYPECTOMY  09/08/2017   Procedure: POLYPECTOMY;  Surgeon: Lucilla Lame, MD;  Location: [facility];  Service: Endoscopy;;   RADIOLOGY WITH ANESTHESIA N/A 08/04/2021   Procedure: Angioplasty/stenting of vertebrobasilar stenosis;  Surgeon: Luanne Bras, MD;  Location: [facility];  Service: Radiology;  Laterality: N/A;   TRACHEOSTOMY TUBE PLACEMENT N/A 08/11/2021   Procedure: TRACHEOSTOMY;  Surgeon: Skotnicki, Meghan A, DO;  Location: [facility];  Service: ENT;  Laterality: N/A;   HPI: 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 He  presented 08/02/21 with R foot pain and R-sided weakness. Transferred to [facility] 7/25. MRI 7/26 revealed interval  expansion of previously identified ventral medullary infarct, extending posteriorly to traverse the medulla to the floor of the fourth ventricle, new scattered  small volume ischemic infarcts involving the right cerebellum as well as the cortical aspects of the right greater than left occipital lobes, and single punctate focus of associated petechial hemorrhage at the right cerebellum. S/p stent placement to the R vertebrobasilar junction 7/26, failed extubation. Trach and PEG placed 7/28. PMH: DM, GERD, TBI with prior ICH, HLD, HTN   No data recorded   Recommendations for follow up therapy are one component of a multi-disciplinary discharge planning process, led by the attending physician.  Recommendations may be updated based on patient status, additional functional criteria and insurance authorization.  Assessment / Plan / Recommendation     11/26/2021   12:00 PM  Clinical Impressions  Clinical Impression Pt demonstrates a severe dysphagia largely unchanged from prior FEES. Pt has standing secretions moving in and out of glottis at baseline. With cues pt has limited breath support to briefly eject these from the glottis but not to the oral cavity. When cued to swallow there is laryngeal elevation but no anterior hyoid excursion for UES opening or epiglottic deflection. Secretions are reaspirated quickly. [name] was given 1/3 tsp of nectar thick juice which resulted in premature spillage and delayed swallow initiation with bolus falling into the glottis before a swallow. Swallow movement again so minimal that bolus was immediately aspirated with cough response but no UES opening. SLP had to terminate study  to retrieve bolus with deep pharyngeal suction to clear airway. Pt appears to have ongoing, severe neuromuscular impact from medullary stroke. If breath support and lung mechanics were more protective, would encourage more agressive trials of PO to rehabilitate the swallow. However, pts pulmonary  hygiene has remained tenuous and the primary concern over swallowing up this point. Discussed with daughter Joelene Millin on day of d/c.  SLP Visit Diagnosis Dysphagia, pharyngeal phase (R13.13)  Impact on safety and function Severe aspiration risk         11/26/2021   12:00 PM  Treatment Recommendations  Treatment Recommendations Defer treatment plan to f/u with SLP         No data to display             11/26/2021   12:00 PM  Diet Recommendations  SLP Diet Recommendations NPO;Alternative means - long-term         11/17/2021    2:00 PM  Other Recommendations  Follow Up Recommendations Skilled nursing-short term rehab (<3 hours/day)  Assistance recommended at discharge Frequent or constant Supervision/Assistance       10/06/2021    2:00 PM  Frequency and Duration   Speech Therapy Frequency (ACUTE ONLY) min 2x/week  Treatment Duration 2 weeks         11/26/2021   12:00 PM  Oral Phase  Oral Phase [facility]       11/26/2021   12:00 PM  Pharyngeal Phase  Pharyngeal Phase Impaired  Pharyngeal- Nectar Teaspoon Moderate aspiration;Penetration/Aspiration before swallow;Penetration/Aspiration during swallow;Penetration/Apiration after swallow;Reduced anterior laryngeal mobility;Reduced epiglottic inversion;Delayed swallow initiation-pyriform sinuses;Pharyngeal residue - pyriform;Pharyngeal residue - valleculae;Lateral channel residue;Inter-arytenoid space residue  Pharyngeal Material enters airway, passes BELOW cords and not ejected out despite cough attempt by patient;Material enters airway, passes BELOW cords then ejected out  Pharyngeal- Puree NT        10/06/2021    2:00 PM  Cervical Esophageal Phase   Cervical Esophageal Phase Impaired  Puree Reduced cricopharyngeal relaxation    [name], MA CCC-SLP  Acute Rehabilitation Services Secure Chat Preferred Office [phone]  [name] 11/26/2021, 12:23 PM

## 2021-11-26 NOTE — NC FL2 (Signed)
[place] LEVEL OF CARE SCREENING TOOL     IDENTIFICATION  Patient Name: Joshua Donovan Birthdate: [birth year] Sex: male Admission Date (Current Location): 08/03/2021  [facility] and [place] Number:  Herbalist and Address:  [facility], [place] [address], [place],  [address]      Provider Number: [id number]  Attending Physician Name and Address:  Geradine Girt, DO  Relative Name and Phone Number:       Current Level of Care: Hospital Recommended Level of Care: [place] Prior Approval Number:    Date Approved/Denied:   PASRR Number: [id number]  Discharge Plan: SNF    Current Diagnoses: Patient Active Problem List   Diagnosis Date Noted   Pressure injury of skin 11/08/2021   HAP (hospital-acquired pneumonia)    NSVT (nonsustained ventricular tachycardia) ([place]) 08/31/2021   Dysphagia 08/25/2021   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   Acute respiratory failure with hypoxia ([place])    Right foot pain 08/03/2021   Alcohol abuse 08/03/2021   CVA (cerebral vascular accident) ([place]) 08/03/2021   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[place]) 08/03/2021   Dysuria 01/23/2019   Encounter for general adult medical examination with abnormal findings 01/23/2019   Type 2 diabetes mellitus with hyperglycemia, without long-term current use of insulin ([place]) 10/31/2018   Erectile dysfunction 10/31/2018   Mixed hyperlipidemia 07/10/2018   Uncontrolled type 2 diabetes mellitus with hyperglycemia ([place]) 06/10/2018   Hypertension 05/24/2018   Encounter for screening colonoscopy    Benign neoplasm of descending colon    Polyp of sigmoid colon    Rectal polyp    Closed fracture of temporal bone ([place]) 09/09/2015   Right frontal lobe punctate hemorrhage ([place]) 09/09/2015   Angioedema 07/19/2015   Malignant essential hypertension 07/19/2015    Orientation RESPIRATION BLADDER Height & Weight     Self, Time, Situation  O2,  Tracheostomy (trach change to be performed monthly) Continent Weight: 176 lb (79.8 kg) Height:  '5\' 7"'$  (170.2 cm)  BEHAVIORAL SYMPTOMS/MOOD NEUROLOGICAL BOWEL NUTRITION STATUS      Incontinent Diet, Feeding tube (please see discharge summary)  AMBULATORY STATUS COMMUNICATION OF NEEDS Skin   Total Care Verbally Surgical wounds (closed incision Neck, skin tear, left thigh posterior, wound incision skin tear scrotum)                       Personal Care Assistance Level of Assistance  Total care Bathing Assistance: Maximum assistance Feeding assistance: Maximum assistance       Functional Limitations Info  Sight, Hearing, Speech Sight Info: Adequate Hearing Info: Adequate Speech Info: Impaired    SPECIAL CARE FACTORS FREQUENCY  PT (By licensed PT), OT (By licensed OT), Speech therapy     PT Frequency: 5x per week OT Frequency: 5x per week            Contractures Contractures Info: Not present    Additional Factors Info  Code Status, Allergies Code Status Info: FULL Allergies Info: Lisinopril,Anchovies,Sulfa Antibiotics           Current Medications (11/26/2021):  This is the current hospital active medication list Current Facility-Administered Medications  Medication Dose Route Frequency Provider Last Rate Last Admin   acetaminophen (TYLENOL) 160 MG/5ML solution 650 mg  650 mg Per Tube Q4H PRN Simonne Maffucci B, MD   650 mg at 11/18/21 1013   amLODipine (NORVASC) tablet 10 mg  10 mg Per Tube Daily Juanito Doom, MD  10 mg at 11/26/21 0848   aspirin chewable tablet 81 mg  81 mg Per Tube Daily Simonne Maffucci B, MD   81 mg at 11/26/21 0849   atorvastatin (LIPITOR) tablet 80 mg  80 mg Per Tube Daily Simonne Maffucci B, MD   80 mg at 11/26/21 0848   bisacodyl (DULCOLAX) suppository 10 mg  10 mg Rectal Daily PRN [name], MD       carvedilol (COREG) tablet 37.5 mg  37.5 mg Per Tube BID WC Nita Sells, MD   37.5 mg at 11/26/21 0848   diclofenac  Sodium (VOLTAREN) 1 % topical gel 2 g  2 g Topical QID Elodia Florence., MD   2 g at 11/26/21 0849   famotidine (PEPCID) 40 MG/5ML suspension 20 mg  20 mg Per Tube Daily Thurnell Lose, MD   20 mg at 11/26/21 0846   feeding supplement ([name]) liquid 237 mL  237 mL Per Tube 6 X Daily [name] U, DO   237 mL at 11/26/21 0846   feeding supplement (PROSource TF20) liquid 60 mL  60 mL Per Tube Daily Barb Merino, MD   60 mL at 45/36/46 8032   folic acid (FOLVITE) tablet 1 mg  1 mg Per Tube Daily Simonne Maffucci B, MD   1 mg at 11/26/21 0849   free water 100 mL  100 mL Per Tube Q4H Elodia Florence., MD   100 mL at 11/26/21 0849   guaiFENesin (ROBITUSSIN) 100 MG/5ML liquid 10 mL  10 mL Per Tube Q4H Mariel Aloe, MD   10 mL at 11/26/21 0848   hydrALAZINE (APRESOLINE) injection 10 mg  10 mg Intravenous Q6H PRN Thurnell Lose, MD       insulin aspart (novoLOG) injection 0-9 Units  0-9 Units Subcutaneous Q6H Thurnell Lose, MD   3 Units at 11/26/21 0028   insulin detemir (LEVEMIR) injection 30 Units  30 Units Subcutaneous BID Thurnell Lose, MD   30 Units at 11/26/21 0847   labetalol (NORMODYNE) injection 10-20 mg  10-20 mg Intravenous Q2H PRN Simonne Maffucci B, MD   20 mg at 08/13/21 1836   liver oil-zinc oxide (DESITIN) 40 % ointment   Topical PRN Bonnielee Haff, MD   1 Application at 01/02/81 1554   multivitamin with minerals tablet 1 tablet  1 tablet Per Tube Daily Barb Merino, MD   1 tablet at 11/26/21 0848   Oral care mouth rinse  15 mL Mouth Rinse PRN Juanito Doom, MD       Oral care mouth rinse  15 mL Mouth Rinse 4 times per day [name] U, DO   15 mL at 11/26/21 0849   polyethylene glycol (MIRALAX / GLYCOLAX) packet 17 g  17 g Per Tube BID PRN [name], MD       potassium chloride (KLOR-CON) packet 40 mEq  40 mEq Per Tube BID Thurnell Lose, MD   40 mEq at 11/26/21 0846   scopolamine (TRANSDERM-SCOP) 1 MG/3DAYS 1.5 mg  1  patch Transdermal Q72H Lala Lund K, MD   1.5 mg at 11/25/21 0841   sodium chloride 0.9 % nebulizer solution 3 mL  3 mL Nebulization Q8H PRN [name] U, DO       thiamine (VITAMIN B1) tablet 100 mg  100 mg Per Tube Daily Simonne Maffucci B, MD   100 mg at 11/26/21 0849   ticagrelor (BRILINTA) tablet 90 mg  90 mg Per Tube BID Juanito Doom, MD   90 mg at 11/26/21 0459     Discharge Medications: Please see discharge summary for a list of discharge medications.  Relevant Imaging Results:  Relevant Lab Results:   Additional Information SSN [id number]  Vinie Sill, LCSW

## 2021-11-26 NOTE — Discharge Summary (Signed)
Physician Discharge Summary  Joshua Donovan YBO:[id number] DOB: 08-08-1955 DOA: 08/03/2021  PCP: [facility] [place] date: 08/03/2021 Discharge date: 11/26/2021  Admitted From: home Discharge disposition: SNF   Recommendations for Outpatient Follow-Up:   Continue SLP evals Continue bolus tube feeds via PEG: - Jevity 1.5 cal 237 ml (1 carton) 6 times per day - PROSource TF20 60 ml daily - Free water flushes 100 ml q 4 hours  Tube feeding regimen provides 2210 kcal, 111 grams of protein, and 1680 ml of H2O.  3.  Cont routine trach care Encourage PMV as tolerated during day, remove at night  At this point he is not candidate for decannulation  4. CBC, BMP weekly 5. dual antiplatelet therapy is 6 months starting from 08/04/2021 thereafter aspirin alone.    Discharge Diagnosis:   Principal Problem:   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HCC) Active Problems:   Acute respiratory failure with hypoxia (HCC)   Hypertension   Uncontrolled type 2 diabetes mellitus with hyperglycemia (HCC)   Mixed hyperlipidemia   Alcohol abuse   NSVT (nonsustained ventricular tachycardia) (HCC)   Dysphagia   Tracheostomy in place ([place])   HAP (hospital-acquired pneumonia)   Pressure injury of skin    Discharge Condition: Improved.  Diet recommendation: NPO-- see above re: tube feeds   Code status: Full.   History of Present Illness:   66 year old with a history of ICH, DM 2, HTN, and HLD who presented to [facility] 7/24 with right hand numbness and weakness and was diagnosed with a small brainstem stroke.  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  He required intubation and was transferred to [facility] for an interventional radiology intervention.  He ultimately required tracheostomy and PEG tube placement, and has subsequently suffered a prolonged hospital stay.    Significant events: 7/24 presented to [facility], ventral medulla  CVA 7/25 tx to [facility], treated with Cleviprex 7/26 cerebral angiogram with stent placement to right vertebro-basilar junction, failed extubation, MRI brain> mild extension of stroke, now involving bilateral medial medullary, patent basilar artery and R VBJ stent  7/28 bedside tracheostomy by PCCM and PEG tube placement by General Surgery 7/31 Bleeding around trach site, bright red blood.  Underwent bronchoscopy, Trach Removed, patient orally reintubated, stoma packed. 8/2 ENT took patient to OR for trach redo 8/9 transferred to medical floor on tracheostomy 8/12 vomiting after bolus feeding with worsening hypoxia 8/13 copious bleeding from the heparin injection site controlled with pressure dressing 8/14 bleeding has resolved.  Trach secretions blood-tinged. 8/15 insurance denied LTAC appeal.  CSW working on SNF. 8/28: peer to peer for LTAC looks like this was denied 8/29: V. tach runs - increased metoprolol  9/2: Slightly increased work of [place] [address] for chest x-ray showed no interval changes 9/5:  Ancef started 2/2 secretions --no wbc or fever 9/12 completed abx course  10/28 completed another antibiotic course 11/3: trach changed  Hospital Course by Problem:   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[place]) with persistent quadriplegia, dysphagia - s/p stent to right vertebral basilar junction on 08/04/2021, seen by neurology, currently on aspirin, Brilinta and statin for secondary prevention, seen by stroke team.  Continue supportive care.  Continue PT OT as tolerated, await SNF bed.  -PER MD [name] on 10/20/2021 duration of dual antiplatelet therapy is 6 months starting from 08/04/2021 thereafter aspirin alone. -neurology follow up outpatient    Acute respiratory failure with hypoxia ([place]) along  with aspiration pneumonia-Patient required ICU admission and mechanical ventilation after admission secondary to worsening mentation. Concern for possible aspiration pneumonitis.  Patient unable to extubate. Tracheostomy placed on 7/31 and revised by ENT on 08/11/21 secondary to bleeding. Currently stable. He has finished antibiotic treatment for his infections.  Most recently grew Serratia / enterobacter / staph aureus-- finished antibiotics on 10/28.  -Remains afebrile   Alcohol abuse - Initially managed on CIWA.  No signs of DTs now, Continue thiamine, multivitamin and folic acid.   Mixed hyperlipidemia - Continue Lipitor.   Hypertension - Continue antihypertensives as below   NSVT (nonsustained ventricular tachycardia) (HCC) EF 60% -on beta-blocker.  Cardiology consulted one-point but not actively following.   Dysphagia - Patient is s/p PEG tube and is on tube feeds via PEG. Continue tube feeds and free water.     Tracheostomy complication (HCC)-resolved as of 08/25/2021 - In setting of DAPT. ENT consulted for revision. Bleeding resolved.   Hypokalemia and hypomagnesemia -stopped HCTZ which likely is worsening his hypokalemia, still on scheduled potassium supplementation monitor potassium levels closely.     Uncontrolled type 2 diabetes mellitus with hyperglycemia (HCC) - Most recent hemoglobin A1C of 8.3%. uncontrolled with hyper- and hypoglycemia.  Levemir along with NovoLog every 4 hours.  CBGs overall acceptable        Medical Consultants:   [facility]   Discharge Exam:   Vitals:   11/26/21 0754 11/26/21 0907  BP:    Pulse: 83 85  Resp: 19 (!) 26  Temp:    SpO2: 96% 98%   Vitals:   11/26/21 0504 11/26/21 0732 11/26/21 0754 11/26/21 0907  BP:  (!) 165/92    Pulse: 80 82 83 85  Resp: _0 (!) 26  Temp:  98.6 F (37 C)    TempSrc:  Oral    SpO2: 97% 97% 96% 98%  Weight:      Height:        General exam: Appears calm and comfortable.    The results of significant diagnostics from this hospitalization (including imaging, microbiology, ancillary and laboratory) are listed below for reference.     Procedures  and Diagnostic Studies:   IR ANGIO INTRA EXTRACRAN SEL COM CAROTID INNOMINATE BILAT MOD SED  Result Date: 08/09/2021 INDICATION: Quadriparesis/quadriplegia with severe 90% stenosis of the right vertebrobasilar junction CLINICAL DATA:  66 year male patient with past medical history of traumatic brain injury with ICH, diabetes, hypertension and hyperlipidemia. He presented with progressively worsening weakness in the right lower extremity, left lower extremity, right upper extremity, and left upper extremity. On the morning of the procedure, patient was flaccid at the right-sided extremities and had severe weakness in the left-sided extremities (grade 1/5). 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 There was occlusion of the left vertebral artery seen distal to the origin of the PICA. 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 EXAM: 1.  Biplane right carotid and cerebral DSA angiogram. 2.  Biplane left common carotid and cerebral DSA cerebral angiogram 3. Indirect left vertebral and cerebral angiogram with tip of the catheter at the origin of the left vertebral artery 4.  Right subclavian arteriogram. 5. Selective catheterization of the right vertebral artery, biplane vertebral and cerebral DSA cerebral angiogram. 6. Selective catheterization of  the right vertebral artery by using the 088 neuron max guide catheter, 5 French AXS catalyst catheter as an intermediate catheter and 021 phenom microcatheter with a 014 Arostotle microwire. 7. High magnification biplane cerebral angiogram and exchange of the microcatheter over an exchange length 014 zoom wire into an Apex 2.25 x 15 mm monorail balloon catheter and angioplasty of the severely 90% stenotic right vertebrobasilar junction. 8. Resolute onyx 3 mm x  22 mm stent placement at the site of the stenosis. 9. Post stent biplane DSA cerebral angiography of the right vertebral artery injection. 10. Right common femoral arteriogram in the RAO projection after withdrawing the sheath with its tip in the external iliac artery and arteriogram Attending: Dr. Frazier Richards Assistant: Dr. Luanne Bras COMPARISON:  CORRELATION IS MADE WITH CTA OF THE HEAD AND NECK DATED August 03, 2021 MEDICATIONS: The antibiotic was administered within 1 hour of the procedure Injection Ancef 2 g Injection Integrilin 4.5 mg at 1.5 mg increments intra-arterially through the right vertebral artery Heparin 3500 units ANESTHESIA/SEDATION: Anesthesia was provided by the anesthesia team. Please refer to the anesthesia notes for detailed information. CONTRAST:  126 mL of Omnipaque 300 FLUOROSCOPY: Fluoroscopy Time: 48 minutes 48 seconds (3499 mGy). COMPLICATIONS: None immediate. TECHNIQUE: Informed written consent was obtained from the patient and his daughter after a thorough discussion of the procedural risks including but not limited to approximately 5% bleeding, hematoma, vascular injury, worsening of the stroke, ventilator dependency etc. Benefits and alternatives. All questions were addressed. Maximal Sterile Barrier Technique was utilized including caps, mask, sterile gowns, sterile gloves, sterile drape, hand hygiene and skin antiseptic. A timeout was performed prior to the initiation of the procedure. PROCEDURE: The right groin was prepped and draped in the usual sterile fashion. Thereafter using modified Seldinger technique, transfemoral access into the right common femoral artery was obtained without difficulty. Over a 0.035 inch guidewire, an 8 French x 25 cm pinnacle sheath was inserted. Through this, and also over 0.035 inch roadrunner wire, a 5 [place] JB 1 catheter was advanced to the aortic arch region and selectively positioned in the right common carotid artery, and biplane DSA  carotid and cerebral angiogram was obtained. Next, with the catheter tip in the left common carotid artery, biplane DSA carotid and cerebral Angiography of the head was obtained. The, selective catheterization of the left subclavian artery followed by angiogram was performed. With the tip of the catheter at the origin of the left vertebral artery, indirect left vertebral and biplane DSA cerebral angiography of left subclavian arterial injection was performed. Next, selective catheterization of the right subclavian artery was performed and the catheter was advanced into the proximal subclavian artery. Next, the wire was exchanged for an exchange length 035 Rosen wire. The JB 1 catheter was removed and 088 neuron max guide catheter and 5 French AXS catalyst catheter were advanced and selective catheterization of the left vertebral artery followed by vertebral and cerebral angiogram was performed. Next, under high magnification angiography, and under roadmap guidance, phenom 21 microcatheter and 0 1 for restarting microwire were advanced into the left posterior cerebral artery and angiogram was performed. Next, the wire was exchanged for a 014 zoom exchange length wire. Subsequently, we selected 2.25 x 15 mm Apex monorail microcatheter and under roadmap guidance, angioplasty of the severely stenotic right vertebrobasilar artery was performed. Post angioplasty angiogram was performed. Next, the balloon was removed. Measurements were made. Next we selected 3 mm x 22 mm resolute onyx stent and the stent  was deployed at the right vertebrobasilar stenotic site. The stent was gently dilated and was inflated up to 10 atmospheric pressure. Next, post stent biplane DSA cerebral angiogram was performed. At this point in time, we felt the goal of the procedure was obtained. The catheter was removed. Femoral access site was further sterilely cleaned and the femoral sheath was withdrawn with its tip in the right external iliac  artery, femoral angiogram was performed in the RAO projection and the femoral access site was closed by using an 8 [place] Angio-Seal. Patient tolerated the procedure well and was transferred to the ICU intubated in stable condition. No immediate complications. FINDINGS: Right CCA: The right common carotid arteriogram demonstrates the right external carotid artery and its major branches to be widely patent. Right ICA: The right internal carotid artery at the bulb to the cranial skull base opacifies normally. No significant stenosis. Right Intracranial: The petrous and cavernous segments are widely patent. Mild 20-30% stenosis at the supraclinoid ICA. The right middle cerebral artery and the right anterior cerebral artery opacify normally into the capillary and venous phases. Mild-to-moderate atheromatous disease with about 30% stenosis in the right M1 segment. There is severe about 70% stenosis of the right pericallosal branch of the ACA seen in the proximal aspect. There is cross filling via the anterior communicating artery seen with opacification of the left anterior cerebral artery. PCOM is patent with faint opacification of the right posterior cerebral artery. Right Sided Anterior Venous: The venous phase demonstrates preferential egress of contrast into the right transverse sinus, the sigmoid sinus and the right internal jugular vein. There is no early venous shunting into the venous structures at the skull base or intracranially into the dural sinuses. Left CCA: The left common carotid arteriogram demonstrates the left external carotid artery and its major branches to be widely patent. Left ICA: The left internal carotid artery at the bulb to the cranial skull base opacifies normally. Mild atheromatous disease at the origin of the ICA. There is mild stenosis seen about 2 cm from the origin of the left ICA measuring 42 percent by the NASCET criteria. Left Intracranial: The petrous, cavernous and supraclinoid  segments are widely patent. The left middle cerebral artery and the left anterior cerebral artery opacify normally into the capillary and venous phases. There is a prominent PCOM opacifying the bilateral posterior cerebral arteries via the distal basilar artery. Left Sided Anterior Venous: The venous phase demonstrates preferential egress of contrast into the right transverse sinus, the sigmoid sinus and the right internal jugular vein. There is opacification of the left transverse sigmoid sinuses and internal jugular veins seen as well. There is no early venous shunting into the venous structures at the skull base or intracranially into the dural sinuses. Indirect left vertebral and cerebral angiogram with injection of the left subclavian artery demonstrate faint opacification of the left vertebral artery. 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 High magnification biplane DSA cerebral angiography of right vertebral artery injection demonstrate severe about 90% stenosis at the right vertebrobasilar junction in length of about 7 mm. Post angioplasty angiogram demonstrate significant residual stenoses. Post stent angiogram demonstrate complete expansion of the stent with no residual stenosis. There is about 40-50% stenosis seen at the mid basilar artery. Right PICA, bilateral superior cerebellar, AICA and posterior cerebral arteries show moderate atheromatous disease. No significant stenosis. IMPRESSION: 1. Right vertebral and cerebral angiogram demonstrated about 90% stenosis at the vertebrobasilar junction. Successful angioplasty and stent  placement without significant residual stenosis. 2.  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 3. 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 4. Mild atheromatous disease in the right intracranial ICA with 20-30% stenosis at the supraclinoid ICA and 30% stenosis in the right M1 segment. There is about 70%  stenoses of the right pericallosal branch of the ACA seen in the A3 segment. 5.  Bilateral P comms are patent with the left PCOM being prominent. PLAN: 1.  Blood pressure goals between 120-140 mm Hg. 2.  Patient to be on dual antiplatelet agents for 6 months. Electronically Signed   By: Frazier Richards M.D.   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 He presented with progressively worsening weakness in the right lower extremity, left lower extremity, right upper extremity, and left upper extremity. On the morning of the procedure, patient was flaccid at the right-sided extremities and had severe weakness in the left-sided extremities (grade 1/5). 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 There was occlusion of the left vertebral artery seen distal to the origin of the PICA. 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 EXAM: 1.  Biplane right carotid and cerebral DSA angiogram. 2.  Biplane left common carotid and cerebral DSA cerebral angiogram 3. Indirect left vertebral and cerebral angiogram with tip of the catheter at the origin of the left vertebral artery 4.  Right subclavian arteriogram. 5. Selective catheterization of the right vertebral artery, biplane vertebral and cerebral DSA cerebral angiogram. 6. Selective catheterization of the right vertebral artery by using the 088 neuron max guide catheter, 5 French AXS catalyst catheter as an intermediate catheter and 021 phenom microcatheter with a 014  Arostotle microwire. 7. High magnification biplane cerebral angiogram and exchange of the microcatheter over an exchange length 014 zoom wire into an Apex 2.25 x 15 mm monorail balloon catheter and angioplasty of the severely 90% stenotic right vertebrobasilar junction. 8. Resolute onyx 3 mm x 22 mm stent placement at the site of the stenosis. 9. Post stent biplane DSA cerebral angiography of the right vertebral artery injection. 10. Right common femoral arteriogram in the RAO projection after withdrawing the sheath with its tip in the external iliac artery and arteriogram Attending: Dr. Frazier Richards Assistant: Dr. Luanne Bras COMPARISON:  CORRELATION IS MADE WITH CTA OF THE HEAD AND NECK DATED August 03, 2021 MEDICATIONS: The antibiotic was administered within 1 hour of the procedure Injection Ancef 2 g Injection Integrilin 4.5 mg at 1.5 mg increments intra-arterially through the right vertebral artery Heparin 3500 units ANESTHESIA/SEDATION: Anesthesia was provided by the anesthesia team. Please refer to the anesthesia notes for detailed information. CONTRAST:  126 mL of Omnipaque 300 FLUOROSCOPY: Fluoroscopy Time: 48 minutes 48 seconds (3499 mGy). COMPLICATIONS: None immediate. TECHNIQUE: Informed written consent was obtained from the patient and his daughter after a thorough discussion of the procedural risks including but not limited to approximately 5% bleeding, hematoma, vascular injury, worsening of the stroke, ventilator dependency etc. Benefits and alternatives. All questions were addressed. Maximal  Sterile Barrier Technique was utilized including caps, mask, sterile gowns, sterile gloves, sterile drape, hand hygiene and skin antiseptic. A timeout was performed prior to the initiation of the procedure. PROCEDURE: The right groin was prepped and draped in the usual sterile fashion. Thereafter using modified Seldinger technique, transfemoral access into the right common femoral artery was obtained without  difficulty. Over a 0.035 inch guidewire, an 8 French x 25 cm pinnacle sheath was inserted. Through this, and also over 0.035 inch roadrunner wire, a 5 [place] JB 1 catheter was advanced to the aortic arch region and selectively positioned in the right common carotid artery, and biplane DSA carotid and cerebral angiogram was obtained. Next, with the catheter tip in the left common carotid artery, biplane DSA carotid and cerebral Angiography of the head was obtained. The, selective catheterization of the left subclavian artery followed by angiogram was performed. With the tip of the catheter at the origin of the left vertebral artery, indirect left vertebral and biplane DSA cerebral angiography of left subclavian arterial injection was performed. Next, selective catheterization of the right subclavian artery was performed and the catheter was advanced into the proximal subclavian artery. Next, the wire was exchanged for an exchange length 035 Rosen wire. The JB 1 catheter was removed and 088 neuron max guide catheter and 5 French AXS catalyst catheter were advanced and selective catheterization of the left vertebral artery followed by vertebral and cerebral angiogram was performed. Next, under high magnification angiography, and under roadmap guidance, phenom 21 microcatheter and 0 1 for restarting microwire were advanced into the left posterior cerebral artery and angiogram was performed. Next, the wire was exchanged for a 014 zoom exchange length wire. Subsequently, we selected 2.25 x 15 mm Apex monorail microcatheter and under roadmap guidance, angioplasty of the severely stenotic right vertebrobasilar artery was performed. Post angioplasty angiogram was performed. Next, the balloon was removed. Measurements were made. Next we selected 3 mm x 22 mm resolute onyx stent and the stent was deployed at the right vertebrobasilar stenotic site. The stent was gently dilated and was inflated up to 10 atmospheric pressure.  Next, post stent biplane DSA cerebral angiogram was performed. At this point in time, we felt the goal of the procedure was obtained. The catheter was removed. Femoral access site was further sterilely cleaned and the femoral sheath was withdrawn with its tip in the right external iliac artery, femoral angiogram was performed in the RAO projection and the femoral access site was closed by using an 8 [place] Angio-Seal. Patient tolerated the procedure well and was transferred to the ICU intubated in stable condition. No immediate complications. FINDINGS: Right CCA: The right common carotid arteriogram demonstrates the right external carotid artery and its major branches to be widely patent. Right ICA: The right internal carotid artery at the bulb to the cranial skull base opacifies normally. No significant stenosis. Right Intracranial: The petrous and cavernous segments are widely patent. Mild 20-30% stenosis at the supraclinoid ICA. The right middle cerebral artery and the right anterior cerebral artery opacify normally into the capillary and venous phases. Mild-to-moderate atheromatous disease with about 30% stenosis in the right M1 segment. There is severe about 70% stenosis of the right pericallosal branch of the ACA seen in the proximal aspect. There is cross filling via the anterior communicating artery seen with opacification of the left anterior cerebral artery. PCOM is patent with faint opacification of the right posterior cerebral artery. Right Sided Anterior Venous: The venous phase demonstrates preferential egress  of contrast into the right transverse sinus, the sigmoid sinus and the right internal jugular vein. There is no early venous shunting into the venous structures at the skull base or intracranially into the dural sinuses. Left CCA: The left common carotid arteriogram demonstrates the left external carotid artery and its major branches to be widely patent. Left ICA: The left internal carotid artery  at the bulb to the cranial skull base opacifies normally. Mild atheromatous disease at the origin of the ICA. There is mild stenosis seen about 2 cm from the origin of the left ICA measuring 42 percent by the NASCET criteria. Left Intracranial: The petrous, cavernous and supraclinoid segments are widely patent. The left middle cerebral artery and the left anterior cerebral artery opacify normally into the capillary and venous phases. There is a prominent PCOM opacifying the bilateral posterior cerebral arteries via the distal basilar artery. Left Sided Anterior Venous: The venous phase demonstrates preferential egress of contrast into the right transverse sinus, the sigmoid sinus and the right internal jugular vein. There is opacification of the left transverse sigmoid sinuses and internal jugular veins seen as well. There is no early venous shunting into the venous structures at the skull base or intracranially into the dural sinuses. Indirect left vertebral and cerebral angiogram with injection of the left subclavian artery demonstrate faint opacification of the left vertebral artery. 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 High magnification biplane DSA cerebral angiography of right vertebral artery injection demonstrate severe about 90% stenosis at the right vertebrobasilar junction in length of about 7 mm. Post angioplasty angiogram demonstrate significant residual stenoses. Post stent angiogram demonstrate complete expansion of the stent with no residual stenosis. There is about 40-50% stenosis seen at the mid basilar artery. Right PICA, bilateral superior cerebellar, AICA and posterior cerebral arteries show moderate atheromatous disease. No significant stenosis. IMPRESSION: 1. Right vertebral and cerebral angiogram demonstrated about 90% stenosis at the vertebrobasilar junction. Successful angioplasty and stent placement without significant residual stenosis. 2.  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 3. 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 4. Mild atheromatous disease in the right intracranial ICA with 20-30% stenosis at the supraclinoid ICA and 30% stenosis in the right M1 segment. There is about 70% stenoses of the right pericallosal branch of the ACA seen in the A3 segment. 5.  Bilateral P comms are patent with the left PCOM being prominent. PLAN: 1.  Blood pressure goals between 120-140 mm Hg. 2.  Patient to be on dual antiplatelet agents for 6 months. Electronically Signed   By: Frazier Richards M.D.   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 He presented with progressively worsening weakness in the right lower extremity, left lower extremity, right upper extremity, and left upper extremity. On the morning of the procedure, patient was flaccid at the right-sided extremities and had severe weakness in the left-sided extremities (grade 1/5). 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 There was occlusion of the left vertebral artery seen distal to the origin of  the PICA. 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 EXAM: 1.  Biplane right carotid and cerebral DSA angiogram. 2.  Biplane left common carotid and cerebral DSA cerebral angiogram 3. Indirect left vertebral and cerebral angiogram with tip of the catheter at the origin of the left vertebral artery 4.  Right subclavian arteriogram. 5. Selective catheterization  of the right vertebral artery, biplane vertebral and cerebral DSA cerebral angiogram. 6. Selective catheterization of the right vertebral artery by using the 088 neuron max guide catheter, 5 French AXS catalyst catheter as an intermediate catheter and 021 phenom microcatheter with a 014 Arostotle microwire. 7. High magnification biplane cerebral angiogram and exchange of the microcatheter over an exchange length 014 zoom wire into an Apex 2.25 x 15 mm monorail balloon catheter and angioplasty of the severely 90% stenotic right vertebrobasilar junction. 8. Resolute onyx 3 mm x 22 mm stent placement at the site of the stenosis. 9. Post stent biplane DSA cerebral angiography of the right vertebral artery injection. 10. Right common femoral arteriogram in the RAO projection after withdrawing the sheath with its tip in the external iliac artery and arteriogram Attending: Dr. Frazier Richards Assistant: Dr. Luanne Bras COMPARISON:  CORRELATION IS MADE WITH CTA OF THE HEAD AND NECK DATED August 03, 2021 MEDICATIONS: The antibiotic was administered within 1 hour of the procedure Injection Ancef 2 g Injection Integrilin 4.5 mg at 1.5 mg increments intra-arterially through the right vertebral artery Heparin 3500 units ANESTHESIA/SEDATION: Anesthesia was provided by the anesthesia team. Please refer to the anesthesia notes for detailed information. CONTRAST:  126 mL of Omnipaque 300 FLUOROSCOPY: Fluoroscopy Time: 48 minutes 48 seconds (3499 mGy). COMPLICATIONS: None immediate. TECHNIQUE: Informed written consent was obtained from the patient and his daughter after a thorough discussion of the procedural risks including but not limited to approximately 5% bleeding, hematoma, vascular injury, worsening of the stroke, ventilator dependency etc. Benefits and alternatives. All questions were addressed. Maximal Sterile Barrier Technique was utilized including caps, mask, sterile gowns, sterile gloves, sterile drape, hand hygiene and  skin antiseptic. A timeout was performed prior to the initiation of the procedure. PROCEDURE: The right groin was prepped and draped in the usual sterile fashion. Thereafter using modified Seldinger technique, transfemoral access into the right common femoral artery was obtained without difficulty. Over a 0.035 inch guidewire, an 8 French x 25 cm pinnacle sheath was inserted. Through this, and also over 0.035 inch roadrunner wire, a 5 [place] JB 1 catheter was advanced to the aortic arch region and selectively positioned in the right common carotid artery, and biplane DSA carotid and cerebral angiogram was obtained. Next, with the catheter tip in the left common carotid artery, biplane DSA carotid and cerebral Angiography of the head was obtained. The, selective catheterization of the left subclavian artery followed by angiogram was performed. With the tip of the catheter at the origin of the left vertebral artery, indirect left vertebral and biplane DSA cerebral angiography of left subclavian arterial injection was performed. Next, selective catheterization of the right subclavian artery was performed and the catheter was advanced into the proximal subclavian artery. Next, the wire was exchanged for an exchange length 035 Rosen wire. The JB 1 catheter was removed and 088 neuron max guide catheter and 5 French AXS catalyst catheter were advanced and selective catheterization of the left vertebral artery followed by vertebral and cerebral angiogram was performed. Next, under high magnification angiography, and under roadmap guidance, phenom  21 microcatheter and 0 1 for restarting microwire were advanced into the left posterior cerebral artery and angiogram was performed. Next, the wire was exchanged for a 014 zoom exchange length wire. Subsequently, we selected 2.25 x 15 mm Apex monorail microcatheter and under roadmap guidance, angioplasty of the severely stenotic right vertebrobasilar artery was performed. Post  angioplasty angiogram was performed. Next, the balloon was removed. Measurements were made. Next we selected 3 mm x 22 mm resolute onyx stent and the stent was deployed at the right vertebrobasilar stenotic site. The stent was gently dilated and was inflated up to 10 atmospheric pressure. Next, post stent biplane DSA cerebral angiogram was performed. At this point in time, we felt the goal of the procedure was obtained. The catheter was removed. Femoral access site was further sterilely cleaned and the femoral sheath was withdrawn with its tip in the right external iliac artery, femoral angiogram was performed in the RAO projection and the femoral access site was closed by using an 8 [place] Angio-Seal. Patient tolerated the procedure well and was transferred to the ICU intubated in stable condition. No immediate complications. FINDINGS: Right CCA: The right common carotid arteriogram demonstrates the right external carotid artery and its major branches to be widely patent. Right ICA: The right internal carotid artery at the bulb to the cranial skull base opacifies normally. No significant stenosis. Right Intracranial: The petrous and cavernous segments are widely patent. Mild 20-30% stenosis at the supraclinoid ICA. The right middle cerebral artery and the right anterior cerebral artery opacify normally into the capillary and venous phases. Mild-to-moderate atheromatous disease with about 30% stenosis in the right M1 segment. There is severe about 70% stenosis of the right pericallosal branch of the ACA seen in the proximal aspect. There is cross filling via the anterior communicating artery seen with opacification of the left anterior cerebral artery. PCOM is patent with faint opacification of the right posterior cerebral artery. Right Sided Anterior Venous: The venous phase demonstrates preferential egress of contrast into the right transverse sinus, the sigmoid sinus and the right internal jugular vein. There is  no early venous shunting into the venous structures at the skull base or intracranially into the dural sinuses. Left CCA: The left common carotid arteriogram demonstrates the left external carotid artery and its major branches to be widely patent. Left ICA: The left internal carotid artery at the bulb to the cranial skull base opacifies normally. Mild atheromatous disease at the origin of the ICA. There is mild stenosis seen about 2 cm from the origin of the left ICA measuring 42 percent by the NASCET criteria. Left Intracranial: The petrous, cavernous and supraclinoid segments are widely patent. The left middle cerebral artery and the left anterior cerebral artery opacify normally into the capillary and venous phases. There is a prominent PCOM opacifying the bilateral posterior cerebral arteries via the distal basilar artery. Left Sided Anterior Venous: The venous phase demonstrates preferential egress of contrast into the right transverse sinus, the sigmoid sinus and the right internal jugular vein. There is opacification of the left transverse sigmoid sinuses and internal jugular veins seen as well. There is no early venous shunting into the venous structures at the skull base or intracranially into the dural sinuses. Indirect left vertebral and cerebral angiogram with injection of the left subclavian artery demonstrate faint opacification of the left vertebral artery. 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 High magnification biplane DSA cerebral angiography of right vertebral artery injection  demonstrate severe about 90% stenosis at the right vertebrobasilar junction in length of about 7 mm. Post angioplasty angiogram demonstrate significant residual stenoses. Post stent angiogram demonstrate complete expansion of the stent with no residual stenosis. There is about 40-50% stenosis seen at the mid basilar artery. Right PICA, bilateral superior cerebellar, AICA and posterior  cerebral arteries show moderate atheromatous disease. No significant stenosis. IMPRESSION: 1. Right vertebral and cerebral angiogram demonstrated about 90% stenosis at the vertebrobasilar junction. Successful angioplasty and stent placement without significant residual stenosis. 2.  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 3. 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 4. Mild atheromatous disease in the right intracranial ICA with 20-30% stenosis at the supraclinoid ICA and 30% stenosis in the right M1 segment. There is about 70% stenoses of the right pericallosal branch of the ACA seen in the A3 segment. 5.  Bilateral P comms are patent with the left PCOM being prominent. PLAN: 1.  Blood pressure goals between 120-140 mm Hg. 2.  Patient to be on dual antiplatelet agents for 6 months. Electronically Signed   By: Frazier Richards M.D.   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 He presented with progressively worsening weakness in the right lower extremity, left lower extremity, right upper extremity, and left upper extremity. On the morning of the procedure, patient was flaccid at the right-sided extremities and had severe weakness in the left-sided extremities (grade 1/5). 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 There was occlusion of the left vertebral artery seen distal to the origin of the PICA. 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 EXAM: 1.  Biplane right carotid and cerebral DSA angiogram. 2.   Biplane left common carotid and cerebral DSA cerebral angiogram 3. Indirect left vertebral and cerebral angiogram with tip of the catheter at the origin of the left vertebral artery 4.  Right subclavian arteriogram. 5. Selective catheterization of the right vertebral artery, biplane vertebral and cerebral DSA cerebral angiogram. 6. Selective catheterization of the right vertebral artery by using the 088 neuron max guide catheter, 5 French AXS catalyst catheter as an intermediate catheter and 021 phenom microcatheter with a 014 Arostotle microwire. 7. High magnification biplane cerebral angiogram and exchange of the microcatheter over an exchange length 014 zoom wire into an Apex 2.25 x 15 mm monorail balloon catheter and angioplasty of the severely 90% stenotic right vertebrobasilar junction. 8. Resolute onyx 3 mm x 22 mm stent placement at the site of the stenosis. 9. Post stent biplane DSA cerebral angiography of the right vertebral artery injection. 10. Right common femoral arteriogram in the RAO projection after withdrawing the sheath with its tip in the external iliac artery and arteriogram Attending: Dr. Frazier Richards Assistant: Dr. Luanne Bras COMPARISON:  CORRELATION IS MADE WITH CTA OF THE HEAD AND NECK DATED August 03, 2021 MEDICATIONS: The antibiotic was administered within 1 hour of the procedure Injection Ancef 2 g Injection Integrilin 4.5 mg at 1.5 mg increments intra-arterially through the right vertebral artery Heparin 3500 units ANESTHESIA/SEDATION: Anesthesia was provided  by the anesthesia team. Please refer to the anesthesia notes for detailed information. CONTRAST:  126 mL of Omnipaque 300 FLUOROSCOPY: Fluoroscopy Time: 48 minutes 48 seconds (3499 mGy). COMPLICATIONS: None immediate. TECHNIQUE: Informed written consent was obtained from the patient and his daughter after a thorough discussion of the procedural risks including but not limited to approximately 5% bleeding, hematoma, vascular  injury, worsening of the stroke, ventilator dependency etc. Benefits and alternatives. All questions were addressed. Maximal Sterile Barrier Technique was utilized including caps, mask, sterile gowns, sterile gloves, sterile drape, hand hygiene and skin antiseptic. A timeout was performed prior to the initiation of the procedure. PROCEDURE: The right groin was prepped and draped in the usual sterile fashion. Thereafter using modified Seldinger technique, transfemoral access into the right common femoral artery was obtained without difficulty. Over a 0.035 inch guidewire, an 8 French x 25 cm pinnacle sheath was inserted. Through this, and also over 0.035 inch roadrunner wire, a 5 [place] JB 1 catheter was advanced to the aortic arch region and selectively positioned in the right common carotid artery, and biplane DSA carotid and cerebral angiogram was obtained. Next, with the catheter tip in the left common carotid artery, biplane DSA carotid and cerebral Angiography of the head was obtained. The, selective catheterization of the left subclavian artery followed by angiogram was performed. With the tip of the catheter at the origin of the left vertebral artery, indirect left vertebral and biplane DSA cerebral angiography of left subclavian arterial injection was performed. Next, selective catheterization of the right subclavian artery was performed and the catheter was advanced into the proximal subclavian artery. Next, the wire was exchanged for an exchange length 035 Rosen wire. The JB 1 catheter was removed and 088 neuron max guide catheter and 5 French AXS catalyst catheter were advanced and selective catheterization of the left vertebral artery followed by vertebral and cerebral angiogram was performed. Next, under high magnification angiography, and under roadmap guidance, phenom 21 microcatheter and 0 1 for restarting microwire were advanced into the left posterior cerebral artery and angiogram was performed.  Next, the wire was exchanged for a 014 zoom exchange length wire. Subsequently, we selected 2.25 x 15 mm Apex monorail microcatheter and under roadmap guidance, angioplasty of the severely stenotic right vertebrobasilar artery was performed. Post angioplasty angiogram was performed. Next, the balloon was removed. Measurements were made. Next we selected 3 mm x 22 mm resolute onyx stent and the stent was deployed at the right vertebrobasilar stenotic site. The stent was gently dilated and was inflated up to 10 atmospheric pressure. Next, post stent biplane DSA cerebral angiogram was performed. At this point in time, we felt the goal of the procedure was obtained. The catheter was removed. Femoral access site was further sterilely cleaned and the femoral sheath was withdrawn with its tip in the right external iliac artery, femoral angiogram was performed in the RAO projection and the femoral access site was closed by using an 8 [place] Angio-Seal. Patient tolerated the procedure well and was transferred to the ICU intubated in stable condition. No immediate complications. FINDINGS: Right CCA: The right common carotid arteriogram demonstrates the right external carotid artery and its major branches to be widely patent. Right ICA: The right internal carotid artery at the bulb to the cranial skull base opacifies normally. No significant stenosis. Right Intracranial: The petrous and cavernous segments are widely patent. Mild 20-30% stenosis at the supraclinoid ICA. The right middle cerebral artery and the right anterior cerebral artery opacify  normally into the capillary and venous phases. Mild-to-moderate atheromatous disease with about 30% stenosis in the right M1 segment. There is severe about 70% stenosis of the right pericallosal branch of the ACA seen in the proximal aspect. There is cross filling via the anterior communicating artery seen with opacification of the left anterior cerebral artery. PCOM is patent with  faint opacification of the right posterior cerebral artery. Right Sided Anterior Venous: The venous phase demonstrates preferential egress of contrast into the right transverse sinus, the sigmoid sinus and the right internal jugular vein. There is no early venous shunting into the venous structures at the skull base or intracranially into the dural sinuses. Left CCA: The left common carotid arteriogram demonstrates the left external carotid artery and its major branches to be widely patent. Left ICA: The left internal carotid artery at the bulb to the cranial skull base opacifies normally. Mild atheromatous disease at the origin of the ICA. There is mild stenosis seen about 2 cm from the origin of the left ICA measuring 42 percent by the NASCET criteria. Left Intracranial: The petrous, cavernous and supraclinoid segments are widely patent. The left middle cerebral artery and the left anterior cerebral artery opacify normally into the capillary and venous phases. There is a prominent PCOM opacifying the bilateral posterior cerebral arteries via the distal basilar artery. Left Sided Anterior Venous: The venous phase demonstrates preferential egress of contrast into the right transverse sinus, the sigmoid sinus and the right internal jugular vein. There is opacification of the left transverse sigmoid sinuses and internal jugular veins seen as well. There is no early venous shunting into the venous structures at the skull base or intracranially into the dural sinuses. Indirect left vertebral and cerebral angiogram with injection of the left subclavian artery demonstrate faint opacification of the left vertebral artery. 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 High magnification biplane DSA cerebral angiography of right vertebral artery injection demonstrate severe about 90% stenosis at the right vertebrobasilar junction in length of about 7 mm. Post angioplasty angiogram demonstrate  significant residual stenoses. Post stent angiogram demonstrate complete expansion of the stent with no residual stenosis. There is about 40-50% stenosis seen at the mid basilar artery. Right PICA, bilateral superior cerebellar, AICA and posterior cerebral arteries show moderate atheromatous disease. No significant stenosis. IMPRESSION: 1. Right vertebral and cerebral angiogram demonstrated about 90% stenosis at the vertebrobasilar junction. Successful angioplasty and stent placement without significant residual stenosis. 2.  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 3. 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 4. Mild atheromatous disease in the right intracranial ICA with 20-30% stenosis at the supraclinoid ICA and 30% stenosis in the right M1 segment. There is about 70% stenoses of the right pericallosal branch of the ACA seen in the A3 segment. 5.  Bilateral P comms are patent with the left PCOM being prominent. PLAN: 1.  Blood pressure goals between 120-140 mm Hg. 2.  Patient to be on dual antiplatelet agents for 6 months. Electronically Signed   By: Frazier Richards M.D.   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 He presented with progressively worsening weakness in the right lower extremity, left lower extremity, right upper  extremity, and left upper extremity. On the morning of the procedure, patient was flaccid at the right-sided extremities and had severe weakness in the left-sided extremities (grade 1/5). 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 There was 40-50% stenosis seen at the mid  basilar artery. There was occlusion of the left vertebral artery seen distal to the origin of the PICA. 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 EXAM: 1.  Biplane right carotid and cerebral DSA angiogram. 2.  Biplane left common carotid and cerebral DSA cerebral angiogram 3. Indirect left vertebral and cerebral angiogram with tip of the catheter at the origin of the left vertebral artery 4.  Right subclavian arteriogram. 5. Selective catheterization of the right vertebral artery, biplane vertebral and cerebral DSA cerebral angiogram. 6. Selective catheterization of the right vertebral artery by using the 088 neuron max guide catheter, 5 [place] AXS catalyst catheter as an intermediate catheter and 021 phenom microcatheter with a 014 Arostotle microwire. 7. High magnification biplane cerebral angiogram and exchange of the microcatheter over an exchange length 014 zoom wire into an Apex 2.25 x 15 mm monorail balloon catheter and angioplasty of the severely 90% stenotic right vertebrobasilar junction. 8. Resolute onyx 3 mm x 22 mm stent placement at the site of the stenosis. 9. Post stent biplane DSA cerebral angiography of the right vertebral artery injection. 10. Right common femoral arteriogram in the RAO projection after withdrawing the sheath with its tip in the external iliac artery and arteriogram Attending: Dr. Frazier Richards Assistant: Dr. Luanne Bras COMPARISON:  CORRELATION IS MADE WITH CTA OF THE HEAD AND NECK DATED August 03, 2021 MEDICATIONS: The antibiotic was administered within 1 hour of the procedure Injection Ancef 2 g Injection Integrilin 4.5 mg at 1.5 mg increments intra-arterially through the right vertebral artery Heparin 3500 units ANESTHESIA/SEDATION: Anesthesia was provided by the anesthesia team. Please refer to the anesthesia notes for detailed information. CONTRAST:  126 mL of Omnipaque 300 FLUOROSCOPY: Fluoroscopy Time: 48  minutes 48 seconds (3499 mGy). COMPLICATIONS: None immediate. TECHNIQUE: Informed written consent was obtained from the patient and his daughter after a thorough discussion of the procedural risks including but not limited to approximately 5% bleeding, hematoma, vascular injury, worsening of the stroke, ventilator dependency etc. Benefits and alternatives. All questions were addressed. Maximal Sterile Barrier Technique was utilized including caps, mask, sterile gowns, sterile gloves, sterile drape, hand hygiene and skin antiseptic. A timeout was performed prior to the initiation of the procedure. PROCEDURE: The right groin was prepped and draped in the usual sterile fashion. Thereafter using modified Seldinger technique, transfemoral access into the right common femoral artery was obtained without difficulty. Over a 0.035 inch guidewire, an 8 French x 25 cm pinnacle sheath was inserted. Through this, and also over 0.035 inch roadrunner wire, a 5 [place] JB 1 catheter was advanced to the aortic arch region and selectively positioned in the right common carotid artery, and biplane DSA carotid and cerebral angiogram was obtained. Next, with the catheter tip in the left common carotid artery, biplane DSA carotid and cerebral Angiography of the head was obtained. The, selective catheterization of the left subclavian artery followed by angiogram was performed. With the tip of the catheter at the origin of the left vertebral artery, indirect left vertebral and biplane DSA cerebral angiography of left subclavian arterial injection was performed. Next, selective catheterization of  the right subclavian artery was performed and the catheter was advanced into the proximal subclavian artery. Next, the wire was exchanged for an exchange length 035 Rosen wire. The JB 1 catheter was removed and 088 neuron max guide catheter and 5 French AXS catalyst catheter were advanced and selective catheterization of the left vertebral artery  followed by vertebral and cerebral angiogram was performed. Next, under high magnification angiography, and under roadmap guidance, phenom 21 microcatheter and 0 1 for restarting microwire were advanced into the left posterior cerebral artery and angiogram was performed. Next, the wire was exchanged for a 014 zoom exchange length wire. Subsequently, we selected 2.25 x 15 mm Apex monorail microcatheter and under roadmap guidance, angioplasty of the severely stenotic right vertebrobasilar artery was performed. Post angioplasty angiogram was performed. Next, the balloon was removed. Measurements were made. Next we selected 3 mm x 22 mm resolute onyx stent and the stent was deployed at the right vertebrobasilar stenotic site. The stent was gently dilated and was inflated up to 10 atmospheric pressure. Next, post stent biplane DSA cerebral angiogram was performed. At this point in time, we felt the goal of the procedure was obtained. The catheter was removed. Femoral access site was further sterilely cleaned and the femoral sheath was withdrawn with its tip in the right external iliac artery, femoral angiogram was performed in the RAO projection and the femoral access site was closed by using an 8 [place] Angio-Seal. Patient tolerated the procedure well and was transferred to the ICU intubated in stable condition. No immediate complications. FINDINGS: Right CCA: The right common carotid arteriogram demonstrates the right external carotid artery and its major branches to be widely patent. Right ICA: The right internal carotid artery at the bulb to the cranial skull base opacifies normally. No significant stenosis. Right Intracranial: The petrous and cavernous segments are widely patent. Mild 20-30% stenosis at the supraclinoid ICA. The right middle cerebral artery and the right anterior cerebral artery opacify normally into the capillary and venous phases. Mild-to-moderate atheromatous disease with about 30% stenosis in  the right M1 segment. There is severe about 70% stenosis of the right pericallosal branch of the ACA seen in the proximal aspect. There is cross filling via the anterior communicating artery seen with opacification of the left anterior cerebral artery. PCOM is patent with faint opacification of the right posterior cerebral artery. Right Sided Anterior Venous: The venous phase demonstrates preferential egress of contrast into the right transverse sinus, the sigmoid sinus and the right internal jugular vein. There is no early venous shunting into the venous structures at the skull base or intracranially into the dural sinuses. Left CCA: The left common carotid arteriogram demonstrates the left external carotid artery and its major branches to be widely patent. Left ICA: The left internal carotid artery at the bulb to the cranial skull base opacifies normally. Mild atheromatous disease at the origin of the ICA. There is mild stenosis seen about 2 cm from the origin of the left ICA measuring 42 percent by the NASCET criteria. Left Intracranial: The petrous, cavernous and supraclinoid segments are widely patent. The left middle cerebral artery and the left anterior cerebral artery opacify normally into the capillary and venous phases. There is a prominent PCOM opacifying the bilateral posterior cerebral arteries via the distal basilar artery. Left Sided Anterior Venous: The venous phase demonstrates preferential egress of contrast into the right transverse sinus, the sigmoid sinus and the right internal jugular vein. There is opacification of the left  transverse sigmoid sinuses and internal jugular veins seen as well. There is no early venous shunting into the venous structures at the skull base or intracranially into the dural sinuses. Indirect left vertebral and cerebral angiogram with injection of the left subclavian artery demonstrate faint opacification of the left vertebral artery. Left vertebral artery occludes  immediately distal to the origin of the PICA. High magnification biplane DSA cerebral angiography of right vertebral artery injection demonstrate severe about 90% stenosis at the right vertebrobasilar junction in length of about 7 mm. Post angioplasty angiogram demonstrate significant residual stenoses. Post stent angiogram demonstrate complete expansion of the stent with no residual stenosis. There is about 40-50% stenosis seen at the mid basilar artery. Right PICA, bilateral superior cerebellar, AICA and posterior cerebral arteries show moderate atheromatous disease. No significant stenosis. IMPRESSION: 1. Right vertebral and cerebral angiogram demonstrated about 90% stenosis at the vertebrobasilar junction. Successful angioplasty and stent placement without significant residual stenosis. 2.  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 3. 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 4. Mild atheromatous disease in the right intracranial ICA with 20-30% stenosis at the supraclinoid ICA and 30% stenosis in the right M1 segment. There is about 70% stenoses of the right pericallosal branch of the ACA seen in the A3 segment. 5.  Bilateral P comms are patent with the left PCOM being prominent. PLAN: 1.  Blood pressure goals between 120-140 mm Hg. 2.  Patient to be on dual antiplatelet agents for 6 months. Electronically Signed   By: Frazier Richards M.D.   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 He presented with progressively worsening weakness in the right lower extremity, left lower extremity, right upper extremity, and left upper extremity. On the morning of the procedure, patient was flaccid at the  right-sided extremities and had severe weakness in the left-sided extremities (grade 1/5). 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 There was occlusion of the left vertebral artery seen distal to the origin of the PICA. 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 EXAM: 1.  Biplane right carotid and cerebral DSA angiogram. 2.  Biplane left common carotid and cerebral DSA cerebral angiogram 3. Indirect left vertebral and cerebral angiogram with tip of the catheter at the origin of the left vertebral artery 4.  Right subclavian arteriogram. 5. Selective catheterization of the right vertebral artery, biplane vertebral and cerebral DSA cerebral angiogram. 6. Selective catheterization of the right vertebral artery by using the 088 neuron max guide catheter, 5 French AXS catalyst catheter as an intermediate catheter and 021 phenom microcatheter with a 014 [place] microwire. 7. High magnification biplane cerebral angiogram and exchange of the microcatheter over an exchange length 014 zoom wire into an Apex 2.25 x 15 mm monorail balloon catheter and angioplasty of the severely 90% stenotic right vertebrobasilar junction. 8. Resolute onyx 3 mm x 22 mm stent placement at the site of the stenosis. 9. Post stent biplane DSA cerebral angiography of the right vertebral artery injection. 10. Right common femoral arteriogram in the RAO projection  after withdrawing the sheath with its tip in the external iliac artery and arteriogram Attending: Dr. Frazier Richards Assistant: Dr. Luanne Bras COMPARISON:  CORRELATION IS MADE WITH CTA OF THE HEAD AND NECK DATED August 03, 2021 MEDICATIONS: The antibiotic was administered within 1 hour of the procedure Injection Ancef 2 g Injection Integrilin 4.5 mg at 1.5 mg increments  intra-arterially through the right vertebral artery Heparin 3500 units ANESTHESIA/SEDATION: Anesthesia was provided by the anesthesia team. Please refer to the anesthesia notes for detailed information. CONTRAST:  126 mL of Omnipaque 300 FLUOROSCOPY: Fluoroscopy Time: 48 minutes 48 seconds (3499 mGy). COMPLICATIONS: None immediate. TECHNIQUE: Informed written consent was obtained from the patient and his daughter after a thorough discussion of the procedural risks including but not limited to approximately 5% bleeding, hematoma, vascular injury, worsening of the stroke, ventilator dependency etc. Benefits and alternatives. All questions were addressed. Maximal Sterile Barrier Technique was utilized including caps, mask, sterile gowns, sterile gloves, sterile drape, hand hygiene and skin antiseptic. A timeout was performed prior to the initiation of the procedure. PROCEDURE: The right groin was prepped and draped in the usual sterile fashion. Thereafter using modified Seldinger technique, transfemoral access into the right common femoral artery was obtained without difficulty. Over a 0.035 inch guidewire, an 8 French x 25 cm pinnacle sheath was inserted. Through this, and also over 0.035 inch roadrunner wire, a 5 [place] JB 1 catheter was advanced to the aortic arch region and selectively positioned in the right common carotid artery, and biplane DSA carotid and cerebral angiogram was obtained. Next, with the catheter tip in the left common carotid artery, biplane DSA carotid and cerebral Angiography of the head was obtained. The, selective catheterization of the left subclavian artery followed by angiogram was performed. With the tip of the catheter at the origin of the left vertebral artery, indirect left vertebral and biplane DSA cerebral angiography of left subclavian arterial injection was performed. Next, selective catheterization of the right subclavian artery was performed and the catheter was advanced into  the proximal subclavian artery. Next, the wire was exchanged for an exchange length 035 Rosen wire. The JB 1 catheter was removed and 088 neuron max guide catheter and 5 French AXS catalyst catheter were advanced and selective catheterization of the left vertebral artery followed by vertebral and cerebral angiogram was performed. Next, under high magnification angiography, and under roadmap guidance, phenom 21 microcatheter and 0 1 for restarting microwire were advanced into the left posterior cerebral artery and angiogram was performed. Next, the wire was exchanged for a 014 zoom exchange length wire. Subsequently, we selected 2.25 x 15 mm Apex monorail microcatheter and under roadmap guidance, angioplasty of the severely stenotic right vertebrobasilar artery was performed. Post angioplasty angiogram was performed. Next, the balloon was removed. Measurements were made. Next we selected 3 mm x 22 mm resolute onyx stent and the stent was deployed at the right vertebrobasilar stenotic site. The stent was gently dilated and was inflated up to 10 atmospheric pressure. Next, post stent biplane DSA cerebral angiogram was performed. At this point in time, we felt the goal of the procedure was obtained. The catheter was removed. Femoral access site was further sterilely cleaned and the femoral sheath was withdrawn with its tip in the right external iliac artery, femoral angiogram was performed in the RAO projection and the femoral access site was closed by using an 8 [place] Angio-Seal. Patient tolerated the procedure well and was transferred to the ICU intubated in stable condition.  No immediate complications. FINDINGS: Right CCA: The right common carotid arteriogram demonstrates the right external carotid artery and its major branches to be widely patent. Right ICA: The right internal carotid artery at the bulb to the cranial skull base opacifies normally. No significant stenosis. Right Intracranial: The petrous and  cavernous segments are widely patent. Mild 20-30% stenosis at the supraclinoid ICA. The right middle cerebral artery and the right anterior cerebral artery opacify normally into the capillary and venous phases. Mild-to-moderate atheromatous disease with about 30% stenosis in the right M1 segment. There is severe about 70% stenosis of the right pericallosal branch of the ACA seen in the proximal aspect. There is cross filling via the anterior communicating artery seen with opacification of the left anterior cerebral artery. PCOM is patent with faint opacification of the right posterior cerebral artery. Right Sided Anterior Venous: The venous phase demonstrates preferential egress of contrast into the right transverse sinus, the sigmoid sinus and the right internal jugular vein. There is no early venous shunting into the venous structures at the skull base or intracranially into the dural sinuses. Left CCA: The left common carotid arteriogram demonstrates the left external carotid artery and its major branches to be widely patent. Left ICA: The left internal carotid artery at the bulb to the cranial skull base opacifies normally. Mild atheromatous disease at the origin of the ICA. There is mild stenosis seen about 2 cm from the origin of the left ICA measuring 42 percent by the NASCET criteria. Left Intracranial: The petrous, cavernous and supraclinoid segments are widely patent. The left middle cerebral artery and the left anterior cerebral artery opacify normally into the capillary and venous phases. There is a prominent PCOM opacifying the bilateral posterior cerebral arteries via the distal basilar artery. Left Sided Anterior Venous: The venous phase demonstrates preferential egress of contrast into the right transverse sinus, the sigmoid sinus and the right internal jugular vein. There is opacification of the left transverse sigmoid sinuses and internal jugular veins seen as well. There is no early venous  shunting into the venous structures at the skull base or intracranially into the dural sinuses. Indirect left vertebral and cerebral angiogram with injection of the left subclavian artery demonstrate faint opacification of the left vertebral artery. 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 High magnification biplane DSA cerebral angiography of right vertebral artery injection demonstrate severe about 90% stenosis at the right vertebrobasilar junction in length of about 7 mm. Post angioplasty angiogram demonstrate significant residual stenoses. Post stent angiogram demonstrate complete expansion of the stent with no residual stenosis. There is about 40-50% stenosis seen at the mid basilar artery. Right PICA, bilateral superior cerebellar, AICA and posterior cerebral arteries show moderate atheromatous disease. No significant stenosis. IMPRESSION: 1. Right vertebral and cerebral angiogram demonstrated about 90% stenosis at the vertebrobasilar junction. Successful angioplasty and stent placement without significant residual stenosis. 2.  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 3. 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 4. Mild atheromatous disease in the right intracranial ICA with 20-30% stenosis at the supraclinoid ICA and 30% stenosis in the right M1 segment. There is about 70% stenoses of the right pericallosal branch of the ACA seen in the A3 segment. 5.  Bilateral P comms are patent with the left PCOM being prominent. PLAN: 1.  Blood pressure goals between 120-140 mm Hg. 2.  Patient to be on dual antiplatelet agents for 6 months.  Electronically Signed   By: Frazier Richards M.D.   On: 08/05/2021 14:38   MR BRAIN WO CONTRAST  Result Date: 08/05/2021 CLINICAL DATA:  66 year old male with history of known medullary infarct and severe vertebrobasilar disease, status post catheter directed revascularization and stenting. EXAM: MRI HEAD  WITHOUT CONTRAST MRA HEAD WITHOUT CONTRAST TECHNIQUE: Multiplanar, multi-echo pulse sequences of the brain and surrounding structures were acquired without intravenous contrast. Angiographic images of the Circle of Willis were acquired using MRA technique without intravenous contrast. COMPARISON:  Comparison made with arteriogram from earlier the same day as well as multiple previous studies. FINDINGS: MRI HEAD FINDINGS Brain: There has been interval expansion of previously identified ventral medullary infarct, now extending posteriorly to traversed the entirety of the medulla to the floor of the fourth ventricle (series 5, images 63, 64, 65), slightly worse on the left. Additionally, there are new scattered patchy ischemic infarcts involving the right cerebellum. Few additional patchy small volume ischemic infarcts noted involving the cortical aspects of the right greater than left occipital lobes (series 5, images 79, 77, 75, 73, 72 no significant associated mass effect. Chronic right cerebellar microhemorrhage noted. There is a new small focus of susceptibility artifact within the adjacent right cerebellum, likely a punctate focus of petechial blood products (series 14, image 20). No other associated blood products. Remainder the brain is otherwise stable in appearance. Stable cerebral volume with chronic small vessel ischemic disease. No new focal parenchymal signal abnormality. Pituitary gland and suprasellar region remain within normal limits. Midline encephalomalacia with chronic hemosiderin staining at the anterior right frontal lobe again noted. Remote lacunar infarcts again noted about the cerebellum, pons, thalami, and left posterior corona radiata. No mass lesion, significant mass effect, or midline shift. Stable ventricular size without hydrocephalus. No extra-axial fluid collection. Vascular: Susceptibility artifact now seen about the vertebrobasilar junction related to interval stenting. Major  intracranial vascular flow voids are maintained. Skull and upper cervical spine: Craniocervical junction within normal limits. Bone marrow signal intensity normal. No new scalp soft tissue abnormality. Sinuses/Orbits: Globes and orbital soft tissues within normal limits. Moderate mucosal thickening throughout the paranasal sinuses with fluid within the nasopharynx. Patient is intubated. Mastoid air cells remain largely clear. Other: None. MRA HEAD FINDINGS Anterior circulation: Visualized distal cervical segments of the internal carotid arteries remain patent with antegrade flow. Petrous segments remain widely patent. Atheromatous irregularity about both carotid siphons with associated moderate stenosis at the supraclinoid left ICA. A1 segments patent. Normal anterior communicating artery complex. Left ACA remains patent. Distal severe right A2/A3 stenosis noted (series 1, image 164), stable. Left M1 segment remains patent. Mild to moderate long segment right M1 stenosis again noted. Negative MCA bifurcations. No proximal MCA branch occlusion. Distal MCA branches remain perfused and symmetric, although demonstrate mild small vessel atheromatous irregularity. Posterior circulation: Heterogeneous atheromatous irregularity again noted about both proximal V4 segments as they course into the cranial vault. Multifocal moderate stenoses involving the proximal left V4 segment again noted. Left V4 remains patent to the takeoff of the left PICA which remains patent and well perfused. Occlusion of the distal left V4 segment beyond the left PICA again noted, stable. On the right, there has been interval placement of a vascular stent extending from the distal right V4 segment across the vertebrobasilar junction into the proximal basilar artery. This traverses the previously seen high-grade stenosis at this level. Grossly patent flow through the stent, although evaluation for possible intra stent stenosis limited by MRA. Stable  and fairly  robust flow seen within the basilar artery distally. Suspected short-segment fenestration noted at the distal basilar artery. Superior cerebellar arteries remain patent. Right PCA primarily supplied via the basilar. Left PCA supplied via the basilar as well as a robust left posterior communicating artery. Atheromatous disease involving both PCAs with associated severe bilateral P2 stenoses. PCAs remain patent to their distal aspects although demonstrate small vessel atheromatous irregularity. Anatomic variants: None significant. IMPRESSION: MRI HEAD IMPRESSION: 1. 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 Additionally, there are new scattered small volume ischemic infarcts involving the right cerebellum as well as the cortical aspects of the right greater than left occipital lobes. No associated mass effect. Single punctate focus of associated petechial hemorrhage at the right cerebellum as above. 2. Otherwise stable appearance of the brain with atrophy, chronic microvascular ischemic disease, and multiple chronic ischemic infarcts as above. MRA HEAD IMPRESSION: 1. Interval placement of a vascular stent extending from the distal right V4 segment across the vertebrobasilar junction into the proximal basilar artery. Grossly patent flow through the stent, although evaluation for possible intra stent stenosis is limited by MRA. Stable and fairly robust flow seen within the basilar artery distally. 2. Otherwise stable appearance of the intracranial circulation with moderate multifocal stenoses involving the proximal left V4 segment with distal left V4 occlusion, severe right A2/A3 stenosis, severe bilateral P2 stenoses, moderate supraclinoid left ICA stenosis, with mild to moderate long segment right M1 stenosis. Electronically Signed   By: Jeannine Boga M.D.   On: 08/05/2021 02:44   MR  ANGIO HEAD WO CONTRAST  Result Date: 08/05/2021 CLINICAL DATA:  66 year old male with history of known medullary infarct and severe vertebrobasilar disease, status post catheter directed revascularization and stenting. EXAM: MRI HEAD WITHOUT CONTRAST MRA HEAD WITHOUT CONTRAST TECHNIQUE: Multiplanar, multi-echo pulse sequences of the brain and surrounding structures were acquired without intravenous contrast. Angiographic images of the Circle of Willis were acquired using MRA technique without intravenous contrast. COMPARISON:  Comparison made with arteriogram from earlier the same day as well as multiple previous studies. FINDINGS: MRI HEAD FINDINGS Brain: There has been interval expansion of previously identified ventral medullary infarct, now extending posteriorly to traversed the entirety of the medulla to the floor of the fourth ventricle (series 5, images 63, 64, 65), slightly worse on the left. Additionally, there are new scattered patchy ischemic infarcts involving the right cerebellum. Few additional patchy small volume ischemic infarcts noted involving the cortical aspects of the right greater than left occipital lobes (series 5, images 79, 77, 75, 73, 72 no significant associated mass effect. Chronic right cerebellar microhemorrhage noted. There is a new small focus of susceptibility artifact within the adjacent right cerebellum, likely a punctate focus of petechial blood products (series 14, image 20). No other associated blood products. Remainder the brain is otherwise stable in appearance. Stable cerebral volume with chronic small vessel ischemic disease. No new focal parenchymal signal abnormality. Pituitary gland and suprasellar region remain within normal limits. Midline encephalomalacia with chronic hemosiderin staining at the anterior right frontal lobe again noted. Remote lacunar infarcts again noted about the cerebellum, pons, thalami, and left posterior corona radiata. No mass lesion,  significant mass effect, or midline shift. Stable ventricular size without hydrocephalus. No extra-axial fluid collection. Vascular: Susceptibility artifact now seen about the vertebrobasilar junction related to interval stenting. Major intracranial vascular flow voids are maintained. Skull and upper cervical spine: Craniocervical junction within  normal limits. Bone marrow signal intensity normal. No new scalp soft tissue abnormality. Sinuses/Orbits: Globes and orbital soft tissues within normal limits. Moderate mucosal thickening throughout the paranasal sinuses with fluid within the nasopharynx. Patient is intubated. Mastoid air cells remain largely clear. Other: None. MRA HEAD FINDINGS Anterior circulation: Visualized distal cervical segments of the internal carotid arteries remain patent with antegrade flow. Petrous segments remain widely patent. Atheromatous irregularity about both carotid siphons with associated moderate stenosis at the supraclinoid left ICA. A1 segments patent. Normal anterior communicating artery complex. Left ACA remains patent. Distal severe right A2/A3 stenosis noted (series 1, image 164), stable. Left M1 segment remains patent. Mild to moderate long segment right M1 stenosis again noted. Negative MCA bifurcations. No proximal MCA branch occlusion. Distal MCA branches remain perfused and symmetric, although demonstrate mild small vessel atheromatous irregularity. Posterior circulation: Heterogeneous atheromatous irregularity again noted about both proximal V4 segments as they course into the cranial vault. Multifocal moderate stenoses involving the proximal left V4 segment again noted. Left V4 remains patent to the takeoff of the left PICA which remains patent and well perfused. Occlusion of the distal left V4 segment beyond the left PICA again noted, stable. On the right, there has been interval placement of a vascular stent extending from the distal right V4 segment across the  vertebrobasilar junction into the proximal basilar artery. This traverses the previously seen high-grade stenosis at this level. Grossly patent flow through the stent, although evaluation for possible intra stent stenosis limited by MRA. Stable and fairly robust flow seen within the basilar artery distally. Suspected short-segment fenestration noted at the distal basilar artery. Superior cerebellar arteries remain patent. Right PCA primarily supplied via the basilar. Left PCA supplied via the basilar as well as a robust left posterior communicating artery. Atheromatous disease involving both PCAs with associated severe bilateral P2 stenoses. PCAs remain patent to their distal aspects although demonstrate small vessel atheromatous irregularity. Anatomic variants: None significant. IMPRESSION: MRI HEAD IMPRESSION: 1. 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 Additionally, there are new scattered small volume ischemic infarcts involving the right cerebellum as well as the cortical aspects of the right greater than left occipital lobes. No associated mass effect. Single punctate focus of associated petechial hemorrhage at the right cerebellum as above. 2. Otherwise stable appearance of the brain with atrophy, chronic microvascular ischemic disease, and multiple chronic ischemic infarcts as above. MRA HEAD IMPRESSION: 1. Interval placement of a vascular stent extending from the distal right V4 segment across the vertebrobasilar junction into the proximal basilar artery. Grossly patent flow through the stent, although evaluation for possible intra stent stenosis is limited by MRA. Stable and fairly robust flow seen within the basilar artery distally. 2. Otherwise stable appearance of the intracranial circulation with moderate multifocal stenoses involving the proximal left V4 segment with distal left V4  occlusion, severe right A2/A3 stenosis, severe bilateral P2 stenoses, moderate supraclinoid left ICA stenosis, with mild to moderate long segment right M1 stenosis. Electronically Signed   By: Jeannine Boga M.D.   On: 08/05/2021 02:44   DG Abd 1 View  Result Date: 08/04/2021 CLINICAL DATA:  NG tube placement EXAM: ABDOMEN - 1 VIEW COMPARISON:  10/11/2012 FINDINGS: Tip of NG tube is seen in the region of the antrum of the stomach. Bowel gas pattern is nonspecific. Pelvis is not included in its entirety. IMPRESSION: Tip of NG tube is seen in  the stomach. Electronically Signed   By: Elmer Picker M.D.   On: 08/04/2021 17:32   DG Chest Port 1 View  Result Date: 08/04/2021 CLINICAL DATA:  Intubated EXAM: PORTABLE CHEST 1 VIEW COMPARISON:  08/04/2021 FINDINGS: Endotracheal tube tip is about 4.7 cm superior to the carina. Subsegmental atelectasis at the left base. Cardiomegaly. No consolidation, pleural effusion or pneumothorax IMPRESSION: 1. Endotracheal tube tip about 4.7 cm superior to the carina. 2. Cardiomegaly.  Subsegmental atelectasis at the left base. Electronically Signed   By: Donavan Foil M.D.   On: 08/04/2021 15:10   DG Chest Port 1 View  Result Date: 08/04/2021 CLINICAL DATA:  Endotracheal intubation EXAM: PORTABLE CHEST 1 VIEW COMPARISON:  Earlier same day FINDINGS: Endotracheal tube tip 6 cm above the carina. Cardiomegaly persists. Pulmonary venous hypertension, possibly with early interstitial edema. Right lateral chest not included on the image. No evidence of collapse or effusion. IMPRESSION: Endotracheal tube tip 6 cm above the carina. Electronically Signed   By: Nelson Chimes M.D.   On: 08/04/2021 13:42     Labs:   Basic Metabolic Panel: Recent Labs  Lab 11/21/21 0230 11/23/21 0347 11/24/21 0746  NA 141 138 138  K 3.5 3.6 4.0  CL 106 106 104  CO2 _0 GLUCOSE 117* 94 205*  BUN _1 CREATININE 0.46* 0.49* 0.49*  CALCIUM 9.2 9.3 9.2  MG 1.7 1.6* 1.7    GFR Estimated Creatinine Clearance: 92 mL/min (A) (by C-G formula based on SCr of 0.49 mg/dL (L)). Liver Function Tests: Recent Labs  Lab 11/24/21 0746  AST 22  ALT 40  ALKPHOS 42  BILITOT 0.3  PROT 6.3*  ALBUMIN 3.0*   No results for input(s): "LIPASE", "AMYLASE" in the last 168 hours. No results for input(s): "AMMONIA" in the last 168 hours. Coagulation profile No results for input(s): "INR", "PROTIME" in the last 168 hours.  CBC: Recent Labs  Lab 11/21/21 0230 11/24/21 0746  WBC 8.3 7.9  NEUTROABS  --  5.1  HGB 12.9* 13.0  HCT 38.7* 40.7  MCV 80.3 82.1  PLT 361 350   Cardiac Enzymes: No results for input(s): "CKTOTAL", "CKMB", "CKMBINDEX", "TROPONINI" in the last 168 hours. BNP: Invalid input(s): "POCBNP" CBG: Recent Labs  Lab 11/25/21 0536 11/25/21 1131 11/25/21 1810 11/25/21 2318 11/26/21 0523  GLUCAP 103* 161* 252* 234* 106*   D-Dimer No results for input(s): "DDIMER" in the last 72 hours. Hgb A1c No results for input(s): "HGBA1C" in the last 72 hours. Lipid Profile No results for input(s): "CHOL", "HDL", "LDLCALC", "TRIG", "CHOLHDL", "LDLDIRECT" in the last 72 hours. Thyroid function studies No results for input(s): "TSH", "T4TOTAL", "T3FREE", "THYROIDAB" in the last 72 hours.  Invalid input(s): "FREET3" Anemia work up No results for input(s): "VITAMINB12", "FOLATE", "FERRITIN", "TIBC", "IRON", "RETICCTPCT" in the last 72 hours. Microbiology No results found for this or any previous visit (from the past 240 hour(s)).   Discharge Instructions:   Discharge Instructions     Discharge wound care:   Complete by: As directed    Wound care to right buttock partial thickness area of skin loss (Stage 2): cleanse with soap and water, rinse and pat gently dry. Apply Desitin ointment to buttocks, perineal area, scrotum, medial and posterior thighs in a thin layer. Turn from side to side, minimizing time in the supine position.   Increase activity  slowly   Complete by: As directed       Allergies as of 11/26/2021  Reactions   Lisinopril Anaphylaxis, Swelling   Angioedema (filled but not taking per family, 08/08/21).  TDD.   Anchovies [fish Allergy] Swelling   Sulfa Antibiotics Swelling        Medication List     STOP taking these medications    hydrochlorothiazide 12.5 MG capsule Commonly known as: MICROZIDE   lisinopril 40 MG tablet Commonly known as: ZESTRIL   metFORMIN 1000 MG tablet Commonly known as: GLUCOPHAGE   metoprolol tartrate 25 MG tablet Commonly known as: LOPRESSOR       TAKE these medications    Accu-Chek Aviva Plus w/Device Kit Use as directed   Accu-Chek FastClix Lancets Misc Use as directed twice daily dia E11.65   acetaminophen 160 MG/5ML solution Commonly known as: TYLENOL Place 20.3 mLs (650 mg total) into feeding tube every 4 (four) hours as needed for mild pain (or temp > 37.5 C (99.5 F)).   amLODipine 10 MG tablet Commonly known as: NORVASC Place 1 tablet (10 mg total) into feeding tube daily. Start taking on: November 27, 2021   aspirin 81 MG chewable tablet Place 1 tablet (81 mg total) into feeding tube daily. Start taking on: November 27, 2021   atorvastatin 80 MG tablet Commonly known as: LIPITOR Take 1 tablet (80 mg total) by mouth daily. What changed: Another medication with the same name was removed. Continue taking this medication, and follow the directions you see here.   bisacodyl 10 MG suppository Commonly known as: DULCOLAX Place 1 suppository (10 mg total) rectally daily as needed for mild constipation.   carvedilol 12.5 MG tablet Commonly known as: COREG Place 3 tablets (37.5 mg total) into feeding tube 2 (two) times daily with a meal.   cetirizine 10 MG tablet Commonly known as: ZYRTEC Take 1 tablet (10 mg total) by mouth daily.   famotidine 40 MG/5ML suspension Commonly known as: PEPCID Place 2.5 mLs (20 mg total) into feeding tube  daily. Start taking on: November 27, 2021   feeding supplement ([name]) Liqd Place 237 mLs into feeding tube 6 (six) times daily.   feeding supplement (PROSource TF20) liquid Place 60 mLs into feeding tube daily. Start taking on: November 28, 7251   folic acid 1 MG tablet Commonly known as: FOLVITE Place 1 tablet (1 mg total) into feeding tube daily. Start taking on: November 27, 2021   free water Soln Place 100 mLs into feeding tube every 4 (four) hours.   glucose blood test strip Commonly known as: Accu-Chek Aviva Plus Use two times daily to check blood sugar   guaiFENesin 100 MG/5ML liquid Commonly known as: ROBITUSSIN Place 10 mLs into feeding tube every 4 (four) hours.   insulin aspart 100 UNIT/ML injection Commonly known as: novoLOG Inject 0-9 Units into the skin every 6 (six) hours.   insulin detemir 100 UNIT/ML injection Commonly known as: LEVEMIR Inject 0.3 mLs (30 Units total) into the skin 2 (two) times daily.   liver oil-zinc oxide 40 % ointment Commonly known as: DESITIN Apply topically as needed for irritation.   mouth rinse Liqd solution 15 mLs by Mouth Rinse route in the morning, at noon, in the evening, and at bedtime.   multivitamin with minerals Tabs tablet Place 1 tablet into feeding tube daily. Start taking on: November 27, 2021   polyethylene glycol 17 g packet Commonly known as: MIRALAX / GLYCOLAX Place 17 g into feeding tube 2 (two) times daily as needed for moderate constipation.   potassium chloride 20 MEQ  packet Commonly known as: KLOR-CON Place 40 mEq into feeding tube daily.   scopolamine 1 MG/3DAYS Commonly known as: TRANSDERM-SCOP Place 1 patch (1.5 mg total) onto the skin every 3 (three) days. Start taking on: November 28, 2021   thiamine 100 MG tablet Commonly known as: VITAMIN B1 Place 1 tablet (100 mg total) into feeding tube daily. Start taking on: November 27, 2021   ticagrelor 90 MG Tabs  tablet Commonly known as: BRILINTA Place 1 tablet (90 mg total) into feeding tube 2 (two) times daily.               Discharge Care Instructions  (From admission, onward)           Start     Ordered   11/26/21 0000  Discharge wound care:       Comments: Wound care to right buttock partial thickness area of skin loss (Stage 2): cleanse with soap and water, rinse and pat gently dry. Apply Desitin ointment to buttocks, perineal area, scrotum, medial and posterior thighs in a thin layer. Turn from side to side, minimizing time in the supine position.   11/26/21 1052              Time coordinating discharge: 55 min  Signed:  Geradine Girt DO  [facility] 11/26/2021, 10:52 AM

## 2021-11-26 NOTE — TOC Transition Note (Signed)
Transition of Care [facility]) - CM/SW Discharge Note   Patient Details  Name: Alvy Alsop MRN: [id number] Date of Birth: 04/23/1955  Transition of Care [facility]) CM/SW Contact:  Vinie Sill, LCSW Phone Number: 11/26/2021, 11:50 AM   Clinical Narrative:     Patient will Discharge to: [place] ([place]) Discharge Date: 11/26/2021 Family Notified: [name],daughter- Please call when patient is transported # [phone] Transport By: Corey Harold  Per MD patient is ready for discharge. RN, patient, and facility notified of discharge. Discharge Summary sent to facility. RN given number for report   [id number], Room 30-B. Ambulance transport requested for patient.   Clinical Social Worker signing off.  Thurmond Butts, MSW, LCSW Clinical Social Worker     Final next level of care: Skilled Nursing Facility Barriers to Discharge: Barriers Resolved   Patient Goals and CMS Choice   CMS Medicare.gov Compare Post Acute Care list provided to:: Patient Represenative (must comment) (Daughter) Choice offered to / list presented to : Adult Children  Discharge Placement              Patient chooses bed at:  [facility] ([place])) Patient to be transferred to facility by: [place] Name of family member notified: daughter,[name] Patient and family notified of of transfer: 11/26/21  Discharge Plan and Services   Discharge Planning Services: CM Consult Post Acute Care Choice: Long Term Acute Care (LTAC)                               Social Determinants of Health (SDOH) Interventions     Readmission Risk Interventions     No data to display

## 2021-11-26 NOTE — Progress Notes (Addendum)
Report called to [place] at [facility] all questions answered. Assessment documented pt remains stable. PIV removed. Discharge packet printed and placed in chart. PTAR on unit to transport pt and all belongings to facility. Pt daughter notified.

## 2021-11-26 NOTE — Progress Notes (Signed)
RT NOTE: Per CCM Dr. Lake Bells trach change to be performed monthly.

## 2022-03-03 DIAGNOSIS — J690 Pneumonitis due to inhalation of food and vomit: Secondary | ICD-10-CM

## 2022-03-03 DIAGNOSIS — Z930 Tracheostomy status: Secondary | ICD-10-CM

## 2022-03-03 DIAGNOSIS — I639 Cerebral infarction, unspecified: Secondary | ICD-10-CM

## 2022-03-03 DIAGNOSIS — J9621 Acute and chronic respiratory failure with hypoxia: Secondary | ICD-10-CM

## 2022-03-04 DIAGNOSIS — Z930 Tracheostomy status: Secondary | ICD-10-CM

## 2022-03-04 DIAGNOSIS — I639 Cerebral infarction, unspecified: Secondary | ICD-10-CM

## 2022-03-04 DIAGNOSIS — J690 Pneumonitis due to inhalation of food and vomit: Secondary | ICD-10-CM

## 2022-03-04 DIAGNOSIS — J9621 Acute and chronic respiratory failure with hypoxia: Secondary | ICD-10-CM

## 2022-03-05 DIAGNOSIS — E119 Type 2 diabetes mellitus without complications: Secondary | ICD-10-CM

## 2022-03-05 DIAGNOSIS — J690 Pneumonitis due to inhalation of food and vomit: Secondary | ICD-10-CM

## 2022-03-05 DIAGNOSIS — I6389 Other cerebral infarction: Secondary | ICD-10-CM

## 2022-03-06 DIAGNOSIS — E119 Type 2 diabetes mellitus without complications: Secondary | ICD-10-CM | POA: Diagnosis not present

## 2022-03-06 DIAGNOSIS — J690 Pneumonitis due to inhalation of food and vomit: Secondary | ICD-10-CM | POA: Diagnosis not present

## 2022-03-06 DIAGNOSIS — I6389 Other cerebral infarction: Secondary | ICD-10-CM | POA: Diagnosis not present

## 2022-03-17 DIAGNOSIS — J690 Pneumonitis due to inhalation of food and vomit: Secondary | ICD-10-CM

## 2022-03-17 DIAGNOSIS — Z930 Tracheostomy status: Secondary | ICD-10-CM

## 2022-03-17 DIAGNOSIS — I639 Cerebral infarction, unspecified: Secondary | ICD-10-CM

## 2022-03-17 DIAGNOSIS — J9621 Acute and chronic respiratory failure with hypoxia: Secondary | ICD-10-CM

## 2022-03-18 DIAGNOSIS — J9621 Acute and chronic respiratory failure with hypoxia: Secondary | ICD-10-CM

## 2022-03-18 DIAGNOSIS — J690 Pneumonitis due to inhalation of food and vomit: Secondary | ICD-10-CM

## 2022-03-18 DIAGNOSIS — Z930 Tracheostomy status: Secondary | ICD-10-CM

## 2022-03-18 DIAGNOSIS — I639 Cerebral infarction, unspecified: Secondary | ICD-10-CM

## 2022-03-19 DIAGNOSIS — J690 Pneumonitis due to inhalation of food and vomit: Secondary | ICD-10-CM | POA: Diagnosis not present

## 2022-03-19 DIAGNOSIS — Z930 Tracheostomy status: Secondary | ICD-10-CM

## 2022-03-19 DIAGNOSIS — I6389 Other cerebral infarction: Secondary | ICD-10-CM | POA: Diagnosis not present

## 2022-03-19 DIAGNOSIS — J9621 Acute and chronic respiratory failure with hypoxia: Secondary | ICD-10-CM

## 2022-03-19 DIAGNOSIS — I639 Cerebral infarction, unspecified: Secondary | ICD-10-CM

## 2022-03-19 DIAGNOSIS — E119 Type 2 diabetes mellitus without complications: Secondary | ICD-10-CM | POA: Diagnosis not present

## 2022-03-20 DIAGNOSIS — J9621 Acute and chronic respiratory failure with hypoxia: Secondary | ICD-10-CM

## 2022-03-20 DIAGNOSIS — I6389 Other cerebral infarction: Secondary | ICD-10-CM | POA: Diagnosis not present

## 2022-03-20 DIAGNOSIS — I639 Cerebral infarction, unspecified: Secondary | ICD-10-CM

## 2022-03-20 DIAGNOSIS — E119 Type 2 diabetes mellitus without complications: Secondary | ICD-10-CM | POA: Diagnosis not present

## 2022-03-20 DIAGNOSIS — J690 Pneumonitis due to inhalation of food and vomit: Secondary | ICD-10-CM | POA: Diagnosis not present

## 2022-03-20 DIAGNOSIS — Z930 Tracheostomy status: Secondary | ICD-10-CM

## 2022-03-21 DIAGNOSIS — J690 Pneumonitis due to inhalation of food and vomit: Secondary | ICD-10-CM

## 2022-03-21 DIAGNOSIS — Z930 Tracheostomy status: Secondary | ICD-10-CM

## 2022-03-21 DIAGNOSIS — I639 Cerebral infarction, unspecified: Secondary | ICD-10-CM

## 2022-03-21 DIAGNOSIS — J9621 Acute and chronic respiratory failure with hypoxia: Secondary | ICD-10-CM

## 2022-03-22 DIAGNOSIS — I639 Cerebral infarction, unspecified: Secondary | ICD-10-CM | POA: Diagnosis not present

## 2022-03-22 DIAGNOSIS — J9621 Acute and chronic respiratory failure with hypoxia: Secondary | ICD-10-CM | POA: Diagnosis not present

## 2022-03-22 DIAGNOSIS — J690 Pneumonitis due to inhalation of food and vomit: Secondary | ICD-10-CM | POA: Diagnosis not present

## 2022-03-22 DIAGNOSIS — Z930 Tracheostomy status: Secondary | ICD-10-CM | POA: Diagnosis not present

## 2023-07-08 ENCOUNTER — Encounter (HOSPITAL_COMMUNITY): Payer: Self-pay | Admitting: Interventional Radiology

## 2023-09-28 NOTE — Anesthesia Preprocedure Evaluation (Signed)
 PROCEDURAL SEDATION NOTE  Date/Time: 09/28/23 2:17 PM  Joshua Donovan is a 68 y.o. male who is scheduled today for IR J Tube Replacement with procedural sedation.   I have reviewed any problems related to the patient's airway and underlying medical conditions. I have reviewed, independently re-evaluated, and confirmed all key pre-sedation elements, and I have determined that sedation the patient in this setting is appropriate.  This sedation plan is for a scheduled, non-emergent procedure  SEDATION PLAN Patient reports no previous complications with sedation/anesthesia.  I have explained the risks, benefits, alternatives, along with possible complications of the procedure and the sedation to the patient/patient agent.   H&P I have reviewed or performed the pre-sedation history and physical exam/assessment.  I am not aware of any recent changes in patient condition that would prevent sedation.   VERIFICATIONS  I have reviewed the patient's current medications.  I have reviewed the patient's allergies and an armband has been placed, if applicable.   NPO STATUS NPO status verified as appropriate for procedure.  AIRWAY ASSESSMENT The patient's ASA Classification is: class 3 - patient with severe systemic disease.  Patient's mouth opening measured at 3 or more finger widths.  Patient's neck has reduced ROM.  Patient's thyromental distance measured at 3 finger widths.  Patient does have a protruding or large tongue.  Patient does have a history of neck radiation or surgery.  Patient does not a history of sleep apnea.  Patient does not currently use CPAP or BIPAP.   MALLAMPATI SCORE The patient's Mallampati Classification is class III.    History Problem List[1]  Medical History[2]  Surgical History[3]   Allergies  Ace inhibitors, Fish containing products, Lisinopril , and Sulfa (sulfonamide antibiotics)   Meds  Current Medications[4]  No data found.  NPO Status NPO Date of  Last Liquid: 09/27/23 Time of last liquid: 2359 Date of Last Solid: 09/27/23 Time of last solid: 2359  Vitals         There is no height or weight on file to calculate BMI.  Evaluated By: Levon Penne Che, PA-C, 09/28/23, 2:17 PM      [1] Patient Active Problem List Diagnosis  . Primary hypertension  . Mixed hyperlipidemia  . Pressure injury of sacral region, stage 4 (CMD)  . Pressure injury of right buttock, stage 2 (CMD)  . H/O: CVA (cerebrovascular accident)  . Type II diabetes mellitus    (CMD)  . Hypokalemia  . Hypomagnesemia  . Microcytic anemia  . Impaired skin integrity  . Wound of sacral region, subsequent encounter  . Encounter for gastrojejunal (GJ) tube placement  . 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 GERD (gastroesophageal reflux disease)  . Complication of feeding tube    (CMD)  . Sepsis    (CMD)  . Pneumonia  . Stercoral colitis  . Leukocytosis  . DM2 (diabetes mellitus, type 2)    (CMD)  [2] Past Medical History: Diagnosis Date  . Aphasia   . Diabetes mellitus    (CMD)   . GERD without esophagitis   . High cholesterol   . Hypertension   . Pressure injury of sacral region, stage 3 (CMD) 03/25/2022  . Quadriplegia    (CMD)   . Stroke    (CMD)   [3] Past Surgical History: Procedure Laterality Date  . TRACHEOSTOMY    [4] Current Outpatient Medications  Medication Sig Dispense Refill  . acetylcysteine (MUCOMYST) 100 mg/mL (10 %) nebulizer solution Inhale 10mL orally every 8 hours.    SABRA  albuterol sulfate 2.5 mg/0.5 mL nebu nebulizer solution Take 2.5 mg by nebulization every 8 (eight) hours.    . aspirin  81 mg chewable tablet 81 mg by J-tube route daily.    . atorvastatin  20 mg/5 mL (4 mg/mL) susp 40 mg by J-tube route at bedtime.    . ertapenem sodium (INVANZ IV) Infuse 1 g into a venous catheter daily. For 14 days.    . ferrous sulfate (FEROSUL) 220 mg (44 mg iron)/5 mL elix elixir 7.5 mL by J-tube route every other day.    . furosemide   (LASIX ) 10 mg/mL solution 40 mg by J-tube route daily Indications: high blood pressure.    . guaiFENesin  (ROBITUSSIN) 100 mg/5 mL syrup 200 mg by J-tube route every 6 (six) hours.    . insulin  glargine U-300 (TOUJEO  SoloStar) 300 unit/mL (1.5 mL) pen pen Inject 20 Units under the skin every morning.    . metFORMIN  (RIOMET ) 500 mg/5 mL soln suspension 5 mL (500 mg total) by J-tube route in the morning and 5 mL (500 mg total) in the evening. Take with meals. (Patient taking differently: 500 mg by J-tube route 2 (two) times a day.)    . omeprazole (PriLOSEC) 2 mg/mL oral suspension 20 mg by J-tube route every morning.    . potassium chloride  20 mEq/15 mL solution 20 mEq by J-tube route 2 (two) times a day.     No current facility-administered medications for this encounter.   Facility-Administered Medications Ordered in Other Encounters  Medication Dose Route Frequency Provider Last Rate Last Admin  . [MAR Hold - Suspended Admission] albuterol sulfate 2.5 mg/0.5 mL nebulizer solution 2.5 mg  2.5 mg nebulization Q8H Pir Saad, MD   2.5 mg at 09/28/23 0739  . [MAR Hold - Suspended Admission] aspirin  chewable tablet 81 mg  81 mg J-tube Daily Pir Saad, MD      . ILDA Hold - Suspended Admission] atorvastatin  (LIPITOR ) tablet 10 mg  10 mg oral At Bedtime Pir Saad, MD      . ILDA Hold - Suspended Admission] dextrose  (D50W) 50 % injection 12.5 g  12.5 g intravenous PRN Pir Saad, MD      . ILDA Hold - Suspended Admission] dextrose  (GLUTOSE) 40 % oral gel 15 g  15 g oral PRN Pir Saad, MD      . ILDA Hold - Suspended Admission] enoxaparin  (LOVENOX ) syringe 40 mg  40 mg subcutaneous At Bedtime Berta Dirks, MD   40 mg at 09/27/23 2054  . [MAR Hold - Suspended Admission] meropenem (MERREM) 1,000 mg in sodium chloride  0.9 % 100 mL IVPB  1,000 mg intravenous Q8H Berta Dirks, MD   Stopped at 09/28/23 0848  . [MAR Hold - Suspended Admission] mupirocin  (BACTROBAN ) 2 % ointment 1 Application  1 Application Each Nostril BID  Stephania Alisa Coe, PA-C   1 Application at 09/28/23 [phone]  . [MAR Hold - Suspended Admission] naloxone (NARCAN) injection 0.1 mg  0.1 mg intramuscular Q10 Min PRN Pir Saad, MD      . ILDA Hold - Suspended Admission] naloxone (NARCAN) injection 0.2 mg  0.2 mg intravenous Q2min PRN Pir Saad, MD      . ILDA Hold - Suspended Admission] naloxone (NARCAN) injection 0.4 mg  0.4 mg intravenous Once PRN Pir Saad, MD      . ILDA Hold - Suspended Admission] potassium chloride  20 mEq in SW 100 mL IVPB premix  20 mEq intravenous Once Pir Saad, MD      . [  MAR Hold - Suspended Admission] sodium chloride  (bolus) 0.9 % bolus 2,112 mL  30 mL/kg intravenous Once Berta Dirks, MD   Held at 09/27/23 [phone]  . [MAR Hold - Suspended Admission] sodium chloride  0.9 % infusion  75 mL/hr intravenous Continuous Berta Dirks, MD 75 mL/hr at 09/28/23 0224 75 mL/hr at 09/28/23 0224  . [MAR Hold - Suspended Admission] sodium chloride  nebulizer solution 3%  4 mL nebulization RTID Berta Dirks, MD   4 mL at 09/28/23 0740  . [MAR Hold - Suspended Admission] vancomycin (VANCOCIN) 1,000 mg in sodium chloride  0.9 % 250 mL IVPB  1,000 mg intravenous Q12H Berta Dirks, MD   Stopped at 09/28/23 [phone]  . [MAR Hold - Suspended Admission] Vancomycin Pharmacy to Manage   miscellaneous Pharmacy to Manage Berta Dirks, MD

## 2023-10-01 NOTE — Progress Notes (Signed)
 HOSPITAL MEDICINE -  PROGRESS NOTE  Hospital Day: #4   Admission Date:  09/27/2023   PCP:  PCP Needed PPI   Extended Emergency Contact Information Primary Emergency Contact: [name],[name] Mobile Phone: [phone] Relation: Daughter   Hospital Course: Joshua Donovan is a 68 y.o. 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 Patient is unable to provide any history as he remains non-verbal. He was also noted with congestion and secretions on arrival.   In the ED, patient was found to have tachypnea with RR ranging upto 20s-30s, mild tachycardia and with white count of 15.9, with CXR demonstrating RLL pneumonia and CT abdomen and pelvis demonstrating multifocal pneumonia and acute stercoral colitis. He was subsequently started on Meropenem and Vancomycin. He was admitted for further management of Pneumonia, Sepsis and GJ tube displacement.  Patient's blood cultures were found to be positive for gram negative bacilli and respiratory cultures positive for gram positive cocci in clusters and gram negative bacilli.  His electrolytes were repleted. He remained on IVF and Meropenem-Vancomycin combination for bacteremia in the setting of Pneumonia vs Stercoral Colitis. He underwent GJ tube placement with Interventional Radiology on 09/28/2023. His white count remained elevated while he remained on IV antibiotics. Infectious Disease was consulted and cultures were followed. His PICC line that he has had since April, 2025 was removed. Following this his white count trended down. Electrolytes were repleted. Moreover, given suboptimally  controlled hypertension, patient was started on Metoprolol  tartrate via G-tube. He was also started on bowel regimen via G-tube. His blood cultures grew Pseudomonas and Urine culture were positive for MRSA.  Discharge Readiness:  Timeframe: >48H Dispo to: SNF Barriers to Discharge: Sepsis/GJ tube displacement  Subjective   CBC, CMP, Mg levels could not be obtained this morning due to edematous status. No fevers noted overnight or this morning. Unable to provide any history.   Assessment & Plan Complication of feeding tube    (CMD) Patient noted with dislodged GJ tube.  S/P replacement and tube feeding has been initiated. Continue tube feeding. Held Lovenox  for one day in the immediate tube replacement setting following which it was resumed. Leukocytosis Pneumonia Sepsis    (CMD) The patient has sepsis with an identified infection and organ dysfunction with presence of sepsis associated with organ dysfunction evidenced by: acute respiratory failure with the following physical findings of respiratory distress: tachypnea and is requiring non-invasive ventilatory support to maintain an oxygen saturation of 90% or greater. The patient will be monitored closely and managed with antibiotics, serial labs, and resuscitation.  Likely in the setting of multifocal pneumonia that appears to be aspiration related. Given history of MDR in the past, started on Vancomycin and Meropenem. S/P IV NS boluses and started on NS @ 75 mls/hr. Maintain aspiration precautions. Patient currently has an FiO2 of 35%. Continue to monitor and if oxygen requirement increases upto 60%, will consider consulting critical care unit. Blood cultures reviewed and positive for Pseudomonas.  Patient remains on meropenem and will continue with it.  He had a PICC line in place that has been removed and post-PICC line removal cultures have been obtained. White count downtrending from 19,000-13,000 yesterday.  Will continue with IV  antibiotics of  Vancomycin and Zosyn (Day-5). Lab work could not be obtained this morning due to significant edematous status. Administered a single dose of Lasix  40 mg IV and will re-attempt. Stercoral colitis Noted on CT abdomen and pelvis. Patient is not a candidate for oral bowel regimen given displacement of NG tube and aspiration PNA. Started on IVF with NS @ 75 mls/hr. Continue IVF. Will not be a candidate for enema/suppository at this time given rectal thickening demonstrated on CT abdomen and pelvis. Resumed tube feeding after GJ tube placement. Started on daily MiraLAX  as well as senna via G-tube.  DM2 (diabetes mellitus, type 2)    (CMD) Currently on tube feeds and started on Lispro SSI. Fingersticks monitored and within optimal limits. Hypokalemia Noted with a Potassium level of 3.2 yesterday that was repleted with IV Potassium. Awaiting re-attempt for lab draw this am.  Hypomagnesemia Awaiting re-attempt for lab draw this am. HTN (hypertension) Noted to be elevated today. Single dose of IV Lasix  40 mg. Increase Metoprolol  to 50 mg BID.   Plan for family this afternoon for goals of care discussion.  DVT Prophylaxis: Lovenox  SubQ  Active Medications: albuterol sulfate, 2.5 mg, nebulization, Q8H aspirin , 81 mg, J-tube, Daily atorvastatin , 10 mg, oral, At Bedtime enoxaparin , 40 mg, subcutaneous, At Bedtime free water , 175 mL, G-tube, Q4H SCH insulin  aspart (NovoLOG )/insulin  lispro (HumaLOG) injection, 0-6 Units, subcutaneous, Q4H SCH magnesium  oxide, 400 mg, G-tube, BID metoprolol  tartrate, 50 mg, G-tube, BID mupirocin , 1 Application, Each Nostril, BID piperacillin-tazobactam, 4.5 g, intravenous, Q8H polyethylene glycol, 17 g, G-tube, Daily scopolamine , 1 patch, topical, Q72H senna, 1 tablet, G-tube, At Bedtime sodium chloride  (bolus), 30 mL/kg, intravenous, Once sodium chloride , 4 mL, nebulization, RTID vancomycin, 1,000 mg, intravenous, Q12H Pharmacy to Dose  Vancomycin, , miscellaneous, Pharmacy to Manage   Infusions: Glucerna 1.5, , Last Rate: 60 mL/hr at 09/30/23 0752 sodium chloride , 75 mL/hr, Last Rate: 75 mL/hr (09/30/23 2341)   Objective   Temp:  [96.9 F (36.1 C)-98.5 F (36.9 C)] 96.9 F (36.1 C) Heart Rate:  [79-112] 79 Resp:  [19-24] 20 BP: (131-166)/(84-113) 131/84 O2 Flow Rate (L/min): 8 L/min  Physical Exam Vitals reviewed.  Eyes:     Pupils: Pupils are equal, round, and reactive to light.  Cardiovascular:     Rate and Rhythm: Normal rate and regular rhythm.  Pulmonary:     Comments: Tachypnea Abdominal:     General: Abdomen is flat.  Musculoskeletal:        General: Swelling (B/L UEs) present. Normal range of motion.  Skin:    General: Skin is warm and dry.  Neurological:     Mental Status: He is alert.     Comments: Nonverbal; Unable to be assessed fully  Psychiatric:     Comments: Unable to assess      Labs   Results from last 2 days  Lab Units 09/30/23 0512  WHITE BLOOD CELL COUNT 10*3/uL 13.20*  HEMOGLOBIN g/dL 89.8*  HEMATOCRIT % 68.0*  PLATELET COUNT 10*3/uL 456*    Results from last 2 days  Lab Units 09/30/23 0514  SODIUM mmol/L 146*  POTASSIUM mmol/L 3.2*  CHLORIDE mmol/L 107  CO2 mmol/L 31  BUN mg/dL 11  CREATININE mg/dL 9.59*  GLUCOSE mg/dL 838*  CALCIUM  mg/dL 8.0*    Results from last 2 days  Lab Units 09/30/23 0514  MAGNESIUM  mg/dL 1.8*      Berta Dirks, MD

## 2023-10-01 NOTE — ACP (Advance Care Planning) (Signed)
 This Clinical research associate had a family meeting with the patient's daughters Ms. Gilberto Cedar and Ms. Suzen Long today regarding the patient's overall condition and poor prognosis. Patient's code status changed to DNR/DNI from DNR only per family. Hospice consult initiated per family request with further determination of plan of care to be made after hospice evaluation. Continue current medical management otherwise, awaiting hospice follow-up.

## 2023-10-01 NOTE — Nursing Note (Signed)
 Lab unable to draw blood on pt. Made provider aware. Per provider proceed with hanging the vancomycin

## 2023-10-02 NOTE — Care Plan (Signed)
  Problem: Respiratory: Goal: Ability to maintain arterial blood gas levels will improve by discharge Description: Ability to maintain arterial blood gas levels within normal range will improve Outcome: Progressing   Problem: Compromised Skin Integrity Goal: Skin integrity is maintained or improved Description: Assess and monitor skin integrity. Identify patients at risk for skin breakdown on admission and per policy. Collaborate with interdisciplinary team and initiate plans and interventions as needed.    Outcome: Progressing   Problem: Safety: Goal: Will remain free from falls by discharge Description: Will remain free from falls by discharge Outcome: Progressing

## 2023-10-02 NOTE — Progress Notes (Signed)
 Infectious Diseases Progress Note [facility]  ASSESSMENT and RECOMMENDATIONS:  Joshua Donovan is a 68 y.o. 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 GJ tube replaced by IR. On admission he was found to have increased and thick respiratory secretions. Chest imaging demonstrates bilateral lower lobe multifocal pneumonia likely from aspiration. Respiratory and blood cultures identified Pseudomonas aeruginosa. Urine culture from new catheter growing MRSA. ID consulted for antibiotic recommendations for bacteremia.   #Bacteremia 2/2 Pseudomonas aeruginosa  #Multifocal aspiration pneumonia #CAUTI 2/2 MRSA #Stercoral colitis #Sacral ulcer   He has many potential sources of bacteremia including his PICC line (present since April 2025), multifocal pneumonia, stercoral colitis, and less likely, sacral ulcer.  Per media tab, the sacral ulcer is markedly improved from April, does not appear infected. Most concerning is the indwelling line, which was removed on 9/19. Repeat blood cultures from 9/19 are no growth to date. His chest imaging demonstrates aspiration pneumonia of bilateral lower lungs, briefly required increased oxygen (10L from baseline 8L) and staff reported increased secretions. His respiratory tract is colonized with Pseudomonas, as his trach aspirate culture has grown PsA 6 times in last 12 months. His Pseudomonas isolate is resistant to carbapenems, transitioned to piperacillin-tazobactam (MIC 16) on 9/20. Recommend 2 week course of piperacillin-tazobactam (09/30/23- 10/13/23).    The MRSA in the urine is concerning as it is an uncommon uropathogen, however two sets of blood cultures (9/17 and 9/19)  without MRSA bacteremia. Continue Vancomycin for 10-14 days.   RECS Continue piperacillin-tazobactam 4.5 g IV q8h, end of therapy 10/13/23 Continue vancomycin (AUC 400-600) for MRSA in urine    Infectious Diseases will continue to follow.  Please contact [facility] ID Consults via [facility] [id number] Lamarr Price, PA-C 10/02/2023 2:15 PM  Discussed case with supervising attending, Dr. Deward, who is in agreement with plan.   HPI :    Interval History: Patient seen and examined. Patient overnight afebrile, HD stable, saturating well on trach on baseline O2 8L. WBC 13.1. Transitioned to pip-tazo over the weekend as PsA resistant to carbapenems. Per primary team, undergoing goals of care. Patient is alert, eyes track. Nonverbal.    Antibiotic Hx:  Vancomycin 9/17- present Piperacillin-tazobactam 9/20- present  Meropenem 9/17- 9/20   ROS :    Complete review of systems was unable to be obtained due to patient factors.   MEDS & ALLERGIES:   Allergies[1]  albuterol sulfate, 2.5 mg, nebulization, Q8H aspirin , 81 mg, J-tube, Daily atorvastatin , 10 mg, oral, At Bedtime enoxaparin , 40 mg, subcutaneous, At Bedtime free water , 175 mL, G-tube, Q4H SCH insulin  aspart (NovoLOG )/insulin  lispro (HumaLOG) injection, 0-6 Units, subcutaneous, Q4H SCH magnesium  oxide, 400 mg, G-tube, BID metoprolol  tartrate, 50 mg, G-tube, BID piperacillin-tazobactam, 4.5 g, intravenous, Q8H polyethylene glycol, 17 g, G-tube, Daily potassium chloride , 20 mEq, intravenous, Once scopolamine , 1 patch, topical, Q72H senna, 1 tablet, G-tube, At Bedtime sodium chloride  (bolus), 30 mL/kg, intravenous, Once sodium chloride , 4 mL, nebulization, RTID vancomycin, 1,000 mg, intravenous, Q12H Pharmacy to Dose Vancomycin, , miscellaneous, Pharmacy to Manage      VITAL SIGNS:   Temp:  [96.8 F (36 C)-97.8 F (36.6 C)] 97 F (36.1 C) Heart Rate:  [77-90] 83 Resp:  [18-20] 20 BP: (119-163)/(69-98)  157/80 PHYSICAL EXAM:  GEN: in NAD Eyes: no scleral icterus HENT: normocephalic, Trach with collar in place CV: tachycardic, regular rhythm, no murmur LUNGS: Normal effort, anterior lung fields coarse ABD: +BS. Soft, non-distended, +GJ tube in place without surrounding erythema or drainage NEURO: Alert, makes eye contact, pupils track. 0/5 motor strength SKIN: warm, dry, no rash or erythema on exposed skin.   MICRO:   09/29/23 Blood cultures 2/2 no growth x 2 days 09/27/23 Respiratory culture (trach): Pseudomonas aeruginosa  09/27/23 Urine culture (catheter): MRSA  09/27/23 2/2 Blood cultures 2/2 sets: Pseudomonas aeruginosa   LABS:   Lab Results  Component Value Date   WBC 13.10 (H) 10/02/2023   HGB 10.0 (L) 10/02/2023   HCT 31.9 (L) 10/02/2023   MCV 71.3 (L) 10/02/2023   PLT 456 (H) 10/02/2023     Lab Results  Component Value Date/Time   NA 139 10/02/2023 0840   K 3.4 10/02/2023 0840   CL 100 10/02/2023 0840   CO2 33 (H) 10/02/2023 0840   BUN 11 10/02/2023 0840   CREATININE 0.37 (L) 10/02/2023 0840    Lab Results  Component Value Date   ALT 9 10/02/2023   AST 14 10/02/2023   BILITOT 0.5 10/02/2023    Sedimentation Rate  Date Value Ref Range Status  10/01/2022 >130 (H) 0 - 20 mm/hr Final    No results found for: CRP   MISCELLANEOUS AND PENDING STUDIES     IMAGING:    XR CHEST 1 VIEW, 09/27/2023  Patchy opacities in the right lung base raise concern for pneumonia or aspiration.   CT ABDOMEN PELVIS W CONTRAST, 09/27/2023  1. Extensive consolidation in the bilateral lower lobes with milder involvement in the right middle lobe and surrounding groundglass opacities is suggestive of multifocal pneumonia. 2. Large colonic stool burden particularly within the rectum with mild rectal wall thickening and trace surrounding fat stranding suggestive of acute stercoral colitis.         [1] Allergies Allergen Reactions  . Ace Inhibitors Anaphylaxis,  Angioedema and Swelling    Angioedema (filled but not taking per family, 08/08/21).  TDD.  SABRA Fish Containing Products Swelling  . Lisinopril  Other (See Comments)  . Sulfa (Sulfonamide Antibiotics) Swelling

## 2023-10-02 NOTE — Progress Notes (Signed)
 HOSPITAL MEDICINE -  PROGRESS NOTE  Hospital Day: #5   Admission Date:  09/27/2023   PCP:  PCP Needed PPI   Extended Emergency Contact Information Primary Emergency Contact: Joshua Donovan,Joshua Donovan Mobile Phone: [phone] Relation: Daughter   Hospital Course: Joshua Donovan is a 68 y.o. 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 Patient is unable to provide any history as he remains non-verbal. He was also noted with congestion and secretions on arrival.   In the ED, patient was found to have tachypnea with RR ranging upto 20s-30s, mild tachycardia and with white count of 15.9, with CXR demonstrating RLL pneumonia and CT abdomen and pelvis demonstrating multifocal pneumonia and acute stercoral colitis. He was subsequently started on Meropenem and Vancomycin. He was admitted for further management of Pneumonia, Sepsis and GJ tube displacement.  Patient's blood cultures were found to be positive for gram negative bacilli and respiratory cultures positive for gram positive cocci in clusters and gram negative bacilli.  His electrolytes were repleted. He remained on IVF and Meropenem-Vancomycin combination for bacteremia in the setting of Pneumonia vs Stercoral Colitis. He underwent GJ tube placement with Interventional Radiology on 09/28/2023. His white count remained elevated while he remained on IV antibiotics. Infectious Disease was consulted and cultures were followed. His PICC line that he has had since April, 2025 was removed. Following this his white count trended down. Electrolytes were repleted. Moreover, given suboptimally  controlled hypertension, patient was started on Metoprolol  tartrate via G-tube. He was also started on bowel regimen via G-tube. His blood cultures grew Pseudomonas and Urine culture were positive for MRSA. While initially patient remained on IV Zosyn and Vancomycin, after family meeting with the patient's daughters, his status of care was changed to Comfort Care (while remaining on his feeds/insulin /previous meds per family request) and therefore, antibiotics were discontinued. Patient is being evaluated by hospice.  Discharge Readiness:  Timeframe: >48H Dispo to: SNF Barriers to Discharge: Sepsis/GJ tube displacement  Subjective   Unable to provide history and responds with yes and no only. Does not appear to be dyspneic. Resting comfortably but has some degree of visible tachypnea.  Assessment & Plan Complication of feeding tube    (CMD) Patient noted with dislodged GJ tube.  S/P replacement and tube feeding has been initiated. Continue tube feeding. D/C Lovenox  given transition to comfort care (ongoing evaluation with hospice). Leukocytosis Pneumonia Sepsis    (CMD) The patient has sepsis with an identified infection and organ dysfunction with presence of sepsis associated with organ dysfunction evidenced by: acute respiratory failure with the following physical findings of respiratory distress: tachypnea and is requiring non-invasive ventilatory support to maintain an oxygen saturation of 90% or greater. The patient will be monitored closely and managed with antibiotics, serial labs, and resuscitation.  Likely in the setting of multifocal pneumonia that appears to be aspiration related. Given history of MDR in the past, started on Vancomycin and Meropenem. S/P IV NS boluses and started on NS @ 75 mls/hr. Maintain aspiration precautions. Patient currently has an FiO2 of 35%. Continue to monitor and if oxygen requirement increases upto 60%, will consider consulting critical care  unit. Blood cultures reviewed and positive for Pseudomonas.  Patient remains on  meropenem and will continue with it.  He had a PICC line in place that has been removed and post-PICC line removal cultures have been obtained. White count initially downtrended and normalized yesterday however, remain elevated at 13,000 again this morning.  Transitioning to comfort care. D/C antibiotics. Stercoral colitis Noted on CT abdomen and pelvis. Patient is not a candidate for oral bowel regimen given displacement of NG tube and aspiration PNA. Started on IVF with NS @ 75 mls/hr. D/C IVF given transition to comfort care. Will not be a candidate for enema/suppository at this time given rectal thickening demonstrated on CT abdomen and pelvis. Resumed tube feeding after GJ tube placement. Started on daily MiraLAX  as well as senna via G-tube.  DM2 (diabetes mellitus, type 2)    (CMD) Currently on tube feeds and started on Lispro SSI. Fingersticks monitored and within optimal limits. HTN (hypertension) Continue metoprolol .   Plan for family this afternoon for goals of care discussion.  DVT Prophylaxis: None/Comfort care  Active Medications: albuterol sulfate, 2.5 mg, nebulization, Q8H aspirin , 81 mg, J-tube, Daily atorvastatin , 10 mg, oral, At Bedtime free water , 175 mL, G-tube, Q4H SCH insulin  aspart (NovoLOG )/insulin  lispro (HumaLOG) injection, 0-6 Units, subcutaneous, Q4H SCH metoprolol  tartrate, 50 mg, G-tube, BID polyethylene glycol, 17 g, G-tube, Daily scopolamine , 1 patch, topical, Q72H senna, 1 tablet, G-tube, At Bedtime sodium chloride , 4 mL, nebulization, RTID   Infusions: Glucerna 1.5, , Last Rate: 60 mL/hr at 09/30/23 0752   Objective   Temp:  [96.8 F (36 C)-97.8 F (36.6 C)] 97 F (36.1 C) Heart Rate:  [77-90] 83 Resp:  [18-20] 20 BP: (119-163)/(69-98) 157/80 O2 Flow Rate (L/min): 8 L/min (8)  Physical Exam Vitals reviewed.  Eyes:     Pupils: Pupils are equal, round,  and reactive to light.  Cardiovascular:     Rate and Rhythm: Normal rate and regular rhythm.  Pulmonary:     Comments: Tachypnea Abdominal:     General: Abdomen is flat.  Musculoskeletal:        General: Swelling (B/L UEs) present. Normal range of motion.  Skin:    General: Skin is warm and dry.  Neurological:     Mental Status: He is alert.     Comments: Nonverbal; Unable to be assessed fully  Psychiatric:     Comments: Unable to assess      Labs   Results from last 2 days  Lab Units 10/02/23 0840 10/01/23 1315  WHITE BLOOD CELL COUNT 10*3/uL 13.10* 9.60  HEMOGLOBIN g/dL 89.9* 89.3*  HEMATOCRIT % 31.9* 33.4*  PLATELET COUNT 10*3/uL 456* 457*    Results from last 2 days  Lab Units 10/02/23 0840 10/01/23 1315  SODIUM mmol/L 139 141  POTASSIUM mmol/L 3.4 3.1*  CHLORIDE mmol/L 100 99  CO2 mmol/L 33* 33*  BUN mg/dL 11 11  CREATININE mg/dL 9.62* 9.55*  GLUCOSE mg/dL 793* 803*  CALCIUM  mg/dL 7.6* 7.8*    Results from last 2 days  Lab Units 10/02/23 0840 10/01/23 1315  MAGNESIUM  mg/dL 1.8* 1.7*      Berta Dirks, MD

## 2023-10-02 NOTE — Progress Notes (Signed)
 Case Management Update  Date: 10/02/2023   Time: 2:19 PM   Patient Type: Inpatient  This writer met with the patient 's family to discuss the discharge plan. His daughter Gilberto and ex-wife are present in his room. They met with Olam Gay, Nurse Liaison with [facility] earlier today. They are considering hospice services at a long-term facility at discharge. They are interested in transferring the patient to a facility in or near [place], closer to the patient's family. They mentioned a facility they considered before called [facility] in [facility]. This Clinical research associate agreed to reach out to that facility to discuss bed availability. This Clinical research associate agreed to update the family with case progress.        Anticipated Discharge Location: To be determined  Tillman CHRISTELLA Narrow, RN

## 2023-10-11 DEATH — deceased
